# Patient Record
Sex: Female | Born: 1935 | Race: Black or African American | Hispanic: No | Marital: Married | Smoking: Never smoker
Health system: Southern US, Community
[De-identification: ages and names within clinical notes are randomized; demographics above are authoritative.]

## PROBLEM LIST (undated history)

## (undated) DIAGNOSIS — T8859XA Other complications of anesthesia, initial encounter: Secondary | ICD-10-CM

## (undated) DIAGNOSIS — D61818 Other pancytopenia: Secondary | ICD-10-CM

## (undated) DIAGNOSIS — Z992 Dependence on renal dialysis: Secondary | ICD-10-CM

## (undated) DIAGNOSIS — D649 Anemia, unspecified: Secondary | ICD-10-CM

## (undated) DIAGNOSIS — G4733 Obstructive sleep apnea (adult) (pediatric): Secondary | ICD-10-CM

## (undated) DIAGNOSIS — E119 Type 2 diabetes mellitus without complications: Secondary | ICD-10-CM

## (undated) DIAGNOSIS — R0989 Other specified symptoms and signs involving the circulatory and respiratory systems: Secondary | ICD-10-CM

## (undated) DIAGNOSIS — K219 Gastro-esophageal reflux disease without esophagitis: Secondary | ICD-10-CM

## (undated) DIAGNOSIS — Z972 Presence of dental prosthetic device (complete) (partial): Secondary | ICD-10-CM

## (undated) DIAGNOSIS — Z973 Presence of spectacles and contact lenses: Secondary | ICD-10-CM

## (undated) DIAGNOSIS — H8110 Benign paroxysmal vertigo, unspecified ear: Secondary | ICD-10-CM

## (undated) DIAGNOSIS — M109 Gout, unspecified: Secondary | ICD-10-CM

## (undated) DIAGNOSIS — N186 End stage renal disease: Secondary | ICD-10-CM

## (undated) DIAGNOSIS — M1990 Unspecified osteoarthritis, unspecified site: Secondary | ICD-10-CM

## (undated) DIAGNOSIS — H409 Unspecified glaucoma: Secondary | ICD-10-CM

## (undated) DIAGNOSIS — I495 Sick sinus syndrome: Secondary | ICD-10-CM

## (undated) DIAGNOSIS — I498 Other specified cardiac arrhythmias: Secondary | ICD-10-CM

## (undated) DIAGNOSIS — C9000 Multiple myeloma not having achieved remission: Secondary | ICD-10-CM

## (undated) DIAGNOSIS — I10 Essential (primary) hypertension: Secondary | ICD-10-CM

## (undated) HISTORY — DX: Other specified symptoms and signs involving the circulatory and respiratory systems: R09.89

## (undated) HISTORY — PX: BACK SURGERY: SHX140

## (undated) HISTORY — DX: Other specified cardiac arrhythmias: I49.8

## (undated) HISTORY — PX: ABDOMINAL HYSTERECTOMY: SHX81

## (undated) HISTORY — DX: Essential (primary) hypertension: I10

## (undated) HISTORY — DX: Type 2 diabetes mellitus without complications: E11.9

## (undated) HISTORY — PX: KNEE SURGERY: SHX244

## (undated) HISTORY — PX: PACEMAKER INSERTION: SHX728

## (undated) HISTORY — DX: Benign paroxysmal vertigo, unspecified ear: H81.10

---

## 1998-04-08 ENCOUNTER — Other Ambulatory Visit: Admission: RE | Admit: 1998-04-08 | Discharge: 1998-04-08 | Payer: Self-pay | Admitting: Internal Medicine

## 1998-04-08 ENCOUNTER — Ambulatory Visit (HOSPITAL_COMMUNITY): Admission: RE | Admit: 1998-04-08 | Discharge: 1998-04-08 | Payer: Self-pay | Admitting: Internal Medicine

## 1998-06-15 ENCOUNTER — Other Ambulatory Visit: Admission: RE | Admit: 1998-06-15 | Discharge: 1998-06-15 | Payer: Self-pay | Admitting: Internal Medicine

## 1998-09-07 ENCOUNTER — Ambulatory Visit (HOSPITAL_COMMUNITY): Admission: RE | Admit: 1998-09-07 | Discharge: 1998-09-07 | Payer: Self-pay | Admitting: Internal Medicine

## 1999-07-18 ENCOUNTER — Emergency Department (HOSPITAL_COMMUNITY): Admission: EM | Admit: 1999-07-18 | Discharge: 1999-07-18 | Payer: Self-pay | Admitting: Emergency Medicine

## 1999-11-03 ENCOUNTER — Ambulatory Visit (HOSPITAL_COMMUNITY): Admission: RE | Admit: 1999-11-03 | Discharge: 1999-11-03 | Payer: Self-pay | Admitting: Neurosurgery

## 1999-11-03 ENCOUNTER — Encounter: Payer: Self-pay | Admitting: Neurosurgery

## 1999-11-16 ENCOUNTER — Encounter: Payer: Self-pay | Admitting: Neurosurgery

## 1999-11-18 ENCOUNTER — Encounter: Payer: Self-pay | Admitting: Neurosurgery

## 1999-11-18 ENCOUNTER — Inpatient Hospital Stay (HOSPITAL_COMMUNITY): Admission: RE | Admit: 1999-11-18 | Discharge: 1999-11-21 | Payer: Self-pay | Admitting: Neurosurgery

## 1999-12-26 ENCOUNTER — Encounter: Admission: RE | Admit: 1999-12-26 | Discharge: 1999-12-26 | Payer: Self-pay | Admitting: Neurosurgery

## 1999-12-26 ENCOUNTER — Encounter: Payer: Self-pay | Admitting: Neurosurgery

## 2000-03-22 ENCOUNTER — Encounter: Payer: Self-pay | Admitting: Neurosurgery

## 2000-03-22 ENCOUNTER — Encounter: Admission: RE | Admit: 2000-03-22 | Discharge: 2000-03-22 | Payer: Self-pay | Admitting: Neurosurgery

## 2000-05-21 ENCOUNTER — Encounter: Admission: RE | Admit: 2000-05-21 | Discharge: 2000-05-21 | Payer: Self-pay | Admitting: Neurosurgery

## 2000-05-21 ENCOUNTER — Encounter: Payer: Self-pay | Admitting: Neurosurgery

## 2000-08-06 ENCOUNTER — Encounter: Payer: Self-pay | Admitting: Internal Medicine

## 2000-08-06 ENCOUNTER — Encounter: Admission: RE | Admit: 2000-08-06 | Discharge: 2000-08-06 | Payer: Self-pay | Admitting: Internal Medicine

## 2000-11-27 ENCOUNTER — Other Ambulatory Visit: Admission: RE | Admit: 2000-11-27 | Discharge: 2000-11-27 | Payer: Self-pay | Admitting: Internal Medicine

## 2000-11-29 ENCOUNTER — Encounter: Admission: RE | Admit: 2000-11-29 | Discharge: 2000-11-29 | Payer: Self-pay | Admitting: Neurosurgery

## 2000-11-29 ENCOUNTER — Encounter: Payer: Self-pay | Admitting: Neurosurgery

## 2001-08-08 ENCOUNTER — Emergency Department (HOSPITAL_COMMUNITY): Admission: EM | Admit: 2001-08-08 | Discharge: 2001-08-08 | Payer: Self-pay | Admitting: Emergency Medicine

## 2001-08-14 ENCOUNTER — Encounter: Payer: Self-pay | Admitting: Orthopaedic Surgery

## 2001-08-20 ENCOUNTER — Inpatient Hospital Stay (HOSPITAL_COMMUNITY): Admission: RE | Admit: 2001-08-20 | Discharge: 2001-08-24 | Payer: Self-pay | Admitting: Orthopaedic Surgery

## 2001-09-30 ENCOUNTER — Encounter: Admission: RE | Admit: 2001-09-30 | Discharge: 2001-11-11 | Payer: Self-pay | Admitting: Orthopaedic Surgery

## 2001-12-24 ENCOUNTER — Encounter: Admission: RE | Admit: 2001-12-24 | Discharge: 2001-12-24 | Payer: Self-pay | Admitting: Internal Medicine

## 2001-12-24 ENCOUNTER — Encounter: Payer: Self-pay | Admitting: Internal Medicine

## 2002-01-09 ENCOUNTER — Encounter: Payer: Self-pay | Admitting: Internal Medicine

## 2002-01-09 ENCOUNTER — Ambulatory Visit (HOSPITAL_COMMUNITY): Admission: RE | Admit: 2002-01-09 | Discharge: 2002-01-09 | Payer: Self-pay | Admitting: Internal Medicine

## 2002-02-16 ENCOUNTER — Encounter: Payer: Self-pay | Admitting: Neurosurgery

## 2002-02-16 ENCOUNTER — Ambulatory Visit (HOSPITAL_COMMUNITY): Admission: RE | Admit: 2002-02-16 | Discharge: 2002-02-16 | Payer: Self-pay | Admitting: Neurosurgery

## 2002-03-17 ENCOUNTER — Ambulatory Visit (HOSPITAL_COMMUNITY): Admission: RE | Admit: 2002-03-17 | Discharge: 2002-03-17 | Payer: Self-pay | Admitting: Gastroenterology

## 2002-03-17 ENCOUNTER — Encounter (INDEPENDENT_AMBULATORY_CARE_PROVIDER_SITE_OTHER): Payer: Self-pay | Admitting: *Deleted

## 2003-06-10 ENCOUNTER — Emergency Department (HOSPITAL_COMMUNITY): Admission: EM | Admit: 2003-06-10 | Discharge: 2003-06-10 | Payer: Self-pay | Admitting: Emergency Medicine

## 2003-10-20 ENCOUNTER — Encounter: Admission: RE | Admit: 2003-10-20 | Discharge: 2003-10-20 | Payer: Self-pay | Admitting: Internal Medicine

## 2004-01-06 ENCOUNTER — Other Ambulatory Visit: Admission: RE | Admit: 2004-01-06 | Discharge: 2004-01-06 | Payer: Self-pay | Admitting: Internal Medicine

## 2004-03-31 ENCOUNTER — Ambulatory Visit (HOSPITAL_COMMUNITY): Admission: RE | Admit: 2004-03-31 | Discharge: 2004-03-31 | Payer: Self-pay | Admitting: Neurosurgery

## 2004-05-18 ENCOUNTER — Encounter: Admission: RE | Admit: 2004-05-18 | Discharge: 2004-05-18 | Payer: Self-pay | Admitting: Neurosurgery

## 2004-05-31 ENCOUNTER — Encounter: Admission: RE | Admit: 2004-05-31 | Discharge: 2004-05-31 | Payer: Self-pay | Admitting: Neurosurgery

## 2004-07-14 ENCOUNTER — Ambulatory Visit (HOSPITAL_COMMUNITY): Admission: RE | Admit: 2004-07-14 | Discharge: 2004-07-14 | Payer: Self-pay | Admitting: Neurosurgery

## 2004-08-02 ENCOUNTER — Ambulatory Visit (HOSPITAL_BASED_OUTPATIENT_CLINIC_OR_DEPARTMENT_OTHER): Admission: RE | Admit: 2004-08-02 | Discharge: 2004-08-02 | Payer: Self-pay | Admitting: Internal Medicine

## 2004-11-18 ENCOUNTER — Ambulatory Visit (HOSPITAL_COMMUNITY): Admission: RE | Admit: 2004-11-18 | Discharge: 2004-11-18 | Payer: Self-pay | Admitting: Neurosurgery

## 2004-12-09 ENCOUNTER — Encounter: Admission: RE | Admit: 2004-12-09 | Discharge: 2004-12-09 | Payer: Self-pay | Admitting: Internal Medicine

## 2005-05-10 ENCOUNTER — Inpatient Hospital Stay (HOSPITAL_COMMUNITY): Admission: RE | Admit: 2005-05-10 | Discharge: 2005-05-11 | Payer: Self-pay | Admitting: Internal Medicine

## 2005-06-22 ENCOUNTER — Emergency Department (HOSPITAL_COMMUNITY): Admission: EM | Admit: 2005-06-22 | Discharge: 2005-06-22 | Payer: Self-pay | Admitting: *Deleted

## 2005-08-08 ENCOUNTER — Other Ambulatory Visit: Admission: RE | Admit: 2005-08-08 | Discharge: 2005-08-08 | Payer: Self-pay | Admitting: Internal Medicine

## 2005-12-04 ENCOUNTER — Ambulatory Visit (HOSPITAL_COMMUNITY): Admission: RE | Admit: 2005-12-04 | Discharge: 2005-12-04 | Payer: Self-pay | Admitting: Internal Medicine

## 2005-12-05 ENCOUNTER — Encounter (INDEPENDENT_AMBULATORY_CARE_PROVIDER_SITE_OTHER): Payer: Self-pay | Admitting: Cardiology

## 2005-12-05 ENCOUNTER — Ambulatory Visit (HOSPITAL_COMMUNITY): Admission: RE | Admit: 2005-12-05 | Discharge: 2005-12-05 | Payer: Self-pay | Admitting: Internal Medicine

## 2006-01-09 ENCOUNTER — Ambulatory Visit: Payer: Self-pay | Admitting: Cardiology

## 2006-01-23 ENCOUNTER — Ambulatory Visit: Payer: Self-pay

## 2006-03-02 ENCOUNTER — Ambulatory Visit: Payer: Self-pay | Admitting: Internal Medicine

## 2006-03-13 ENCOUNTER — Ambulatory Visit (HOSPITAL_COMMUNITY): Admission: RE | Admit: 2006-03-13 | Discharge: 2006-03-13 | Payer: Self-pay | Admitting: *Deleted

## 2006-03-13 ENCOUNTER — Encounter: Payer: Self-pay | Admitting: Vascular Surgery

## 2006-03-19 ENCOUNTER — Ambulatory Visit: Payer: Self-pay | Admitting: Cardiology

## 2006-03-26 ENCOUNTER — Ambulatory Visit: Payer: Self-pay | Admitting: Cardiology

## 2006-03-28 ENCOUNTER — Encounter: Admission: RE | Admit: 2006-03-28 | Discharge: 2006-03-28 | Payer: Self-pay | Admitting: Internal Medicine

## 2006-04-17 ENCOUNTER — Ambulatory Visit: Payer: Self-pay | Admitting: Internal Medicine

## 2006-04-23 ENCOUNTER — Ambulatory Visit: Payer: Self-pay | Admitting: Cardiology

## 2006-06-13 ENCOUNTER — Ambulatory Visit: Payer: Self-pay | Admitting: Internal Medicine

## 2006-06-15 ENCOUNTER — Ambulatory Visit: Payer: Self-pay | Admitting: Cardiology

## 2006-06-25 ENCOUNTER — Encounter: Admission: RE | Admit: 2006-06-25 | Discharge: 2006-06-25 | Payer: Self-pay | Admitting: Neurosurgery

## 2006-07-17 ENCOUNTER — Encounter: Admission: RE | Admit: 2006-07-17 | Discharge: 2006-07-17 | Payer: Self-pay | Admitting: Neurosurgery

## 2006-09-20 ENCOUNTER — Encounter: Admission: RE | Admit: 2006-09-20 | Discharge: 2006-09-20 | Payer: Self-pay | Admitting: Neurosurgery

## 2006-11-06 ENCOUNTER — Encounter: Admission: RE | Admit: 2006-11-06 | Discharge: 2006-12-04 | Payer: Self-pay | Admitting: Internal Medicine

## 2006-12-05 ENCOUNTER — Emergency Department (HOSPITAL_COMMUNITY): Admission: EM | Admit: 2006-12-05 | Discharge: 2006-12-05 | Payer: Self-pay | Admitting: Emergency Medicine

## 2006-12-05 ENCOUNTER — Encounter: Admission: RE | Admit: 2006-12-05 | Discharge: 2006-12-18 | Payer: Self-pay | Admitting: Internal Medicine

## 2006-12-17 ENCOUNTER — Ambulatory Visit: Payer: Self-pay | Admitting: Cardiology

## 2006-12-31 ENCOUNTER — Ambulatory Visit: Payer: Self-pay | Admitting: Cardiology

## 2006-12-31 LAB — CONVERTED CEMR LAB
ALT: 16 units/L (ref 0–40)
AST: 21 units/L (ref 0–37)
Albumin: 3.9 g/dL (ref 3.5–5.2)
Alkaline Phosphatase: 43 units/L (ref 39–117)
BUN: 26 mg/dL — ABNORMAL HIGH (ref 6–23)
Basophils Absolute: 0 10*3/uL (ref 0.0–0.1)
Basophils Relative: 0.6 % (ref 0.0–1.0)
Bilirubin, Direct: 0.1 mg/dL (ref 0.0–0.3)
CO2: 26 meq/L (ref 19–32)
Calcium: 9 mg/dL (ref 8.4–10.5)
Chloride: 106 meq/L (ref 96–112)
Creatinine, Ser: 1.2 mg/dL (ref 0.4–1.2)
Eosinophils Absolute: 0.1 10*3/uL (ref 0.0–0.6)
Eosinophils Relative: 1.9 % (ref 0.0–5.0)
GFR calc Af Amer: 56 mL/min
GFR calc non Af Amer: 47 mL/min
Glucose, Bld: 110 mg/dL — ABNORMAL HIGH (ref 70–99)
HCT: 32.1 % — ABNORMAL LOW (ref 36.0–46.0)
Hemoglobin: 11.1 g/dL — ABNORMAL LOW (ref 12.0–15.0)
Hgb A1c MFr Bld: 6.4 % — ABNORMAL HIGH (ref 4.6–6.0)
Lymphocytes Relative: 27.3 % (ref 12.0–46.0)
MCHC: 34.6 g/dL (ref 30.0–36.0)
MCV: 81.4 fL (ref 78.0–100.0)
Monocytes Absolute: 0.3 10*3/uL (ref 0.2–0.7)
Monocytes Relative: 5.6 % (ref 3.0–11.0)
Neutro Abs: 4 10*3/uL (ref 1.4–7.7)
Neutrophils Relative %: 64.6 % (ref 43.0–77.0)
Platelets: 228 10*3/uL (ref 150–400)
Potassium: 4.3 meq/L (ref 3.5–5.1)
RBC: 3.95 M/uL (ref 3.87–5.11)
RDW: 14.8 % — ABNORMAL HIGH (ref 11.5–14.6)
Sodium: 139 meq/L (ref 135–145)
T3, Free: 2.9 pg/mL (ref 2.3–4.2)
TSH: 2.71 microintl units/mL (ref 0.35–5.50)
Total Bilirubin: 0.7 mg/dL (ref 0.3–1.2)
Total Protein: 6.7 g/dL (ref 6.0–8.3)
WBC: 6.1 10*3/uL (ref 4.5–10.5)

## 2007-01-24 ENCOUNTER — Ambulatory Visit: Payer: Self-pay

## 2007-02-12 ENCOUNTER — Ambulatory Visit: Payer: Self-pay | Admitting: Internal Medicine

## 2007-03-27 ENCOUNTER — Ambulatory Visit: Payer: Self-pay | Admitting: Cardiology

## 2007-04-08 ENCOUNTER — Encounter: Admission: RE | Admit: 2007-04-08 | Discharge: 2007-04-08 | Payer: Self-pay | Admitting: Neurosurgery

## 2007-05-03 ENCOUNTER — Encounter: Admission: RE | Admit: 2007-05-03 | Discharge: 2007-05-03 | Payer: Self-pay | Admitting: Internal Medicine

## 2007-12-11 ENCOUNTER — Ambulatory Visit (HOSPITAL_COMMUNITY): Admission: RE | Admit: 2007-12-11 | Discharge: 2007-12-11 | Payer: Self-pay | Admitting: Internal Medicine

## 2007-12-20 ENCOUNTER — Ambulatory Visit: Payer: Self-pay

## 2007-12-20 ENCOUNTER — Ambulatory Visit: Payer: Self-pay | Admitting: Cardiology

## 2007-12-20 LAB — CONVERTED CEMR LAB
BUN: 30 mg/dL — ABNORMAL HIGH (ref 6–23)
CO2: 31 meq/L (ref 19–32)
Calcium: 9.4 mg/dL (ref 8.4–10.5)
Chloride: 104 meq/L (ref 96–112)
Creatinine, Ser: 1.2 mg/dL (ref 0.4–1.2)
GFR calc Af Amer: 56 mL/min
GFR calc non Af Amer: 47 mL/min
Glucose, Bld: 150 mg/dL — ABNORMAL HIGH (ref 70–99)
Potassium: 4.1 meq/L (ref 3.5–5.1)
Sodium: 141 meq/L (ref 135–145)

## 2008-01-24 ENCOUNTER — Encounter: Admission: RE | Admit: 2008-01-24 | Discharge: 2008-01-24 | Payer: Self-pay | Admitting: Orthopaedic Surgery

## 2008-02-12 ENCOUNTER — Encounter: Admission: RE | Admit: 2008-02-12 | Discharge: 2008-02-12 | Payer: Self-pay | Admitting: Orthopaedic Surgery

## 2008-02-12 ENCOUNTER — Encounter: Payer: Self-pay | Admitting: Internal Medicine

## 2008-02-24 ENCOUNTER — Ambulatory Visit: Payer: Self-pay | Admitting: Internal Medicine

## 2008-02-24 DIAGNOSIS — E119 Type 2 diabetes mellitus without complications: Secondary | ICD-10-CM

## 2008-02-24 DIAGNOSIS — G4733 Obstructive sleep apnea (adult) (pediatric): Secondary | ICD-10-CM | POA: Insufficient documentation

## 2008-02-24 DIAGNOSIS — R0989 Other specified symptoms and signs involving the circulatory and respiratory systems: Secondary | ICD-10-CM

## 2008-02-24 DIAGNOSIS — J309 Allergic rhinitis, unspecified: Secondary | ICD-10-CM | POA: Insufficient documentation

## 2008-02-24 DIAGNOSIS — I10 Essential (primary) hypertension: Secondary | ICD-10-CM | POA: Insufficient documentation

## 2008-02-24 DIAGNOSIS — G2589 Other specified extrapyramidal and movement disorders: Secondary | ICD-10-CM | POA: Insufficient documentation

## 2008-02-24 HISTORY — DX: Essential (primary) hypertension: I10

## 2008-02-24 HISTORY — DX: Other specified symptoms and signs involving the circulatory and respiratory systems: R09.89

## 2008-02-24 HISTORY — DX: Type 2 diabetes mellitus without complications: E11.9

## 2008-03-12 ENCOUNTER — Ambulatory Visit: Payer: Self-pay | Admitting: Internal Medicine

## 2008-03-12 ENCOUNTER — Encounter: Payer: Self-pay | Admitting: Internal Medicine

## 2008-03-18 ENCOUNTER — Encounter: Admission: RE | Admit: 2008-03-18 | Discharge: 2008-03-18 | Payer: Self-pay | Admitting: Neurosurgery

## 2008-05-22 ENCOUNTER — Encounter: Admission: RE | Admit: 2008-05-22 | Discharge: 2008-05-22 | Payer: Self-pay | Admitting: Internal Medicine

## 2008-06-30 ENCOUNTER — Ambulatory Visit: Payer: Self-pay | Admitting: Cardiology

## 2008-09-04 ENCOUNTER — Ambulatory Visit: Payer: Self-pay | Admitting: Cardiology

## 2008-10-12 ENCOUNTER — Ambulatory Visit: Payer: Self-pay | Admitting: Internal Medicine

## 2008-12-23 ENCOUNTER — Ambulatory Visit: Payer: Self-pay

## 2009-01-27 ENCOUNTER — Encounter: Payer: Self-pay | Admitting: Cardiology

## 2009-01-27 ENCOUNTER — Ambulatory Visit: Payer: Self-pay | Admitting: Cardiology

## 2009-01-27 DIAGNOSIS — I498 Other specified cardiac arrhythmias: Secondary | ICD-10-CM

## 2009-01-27 HISTORY — DX: Other specified cardiac arrhythmias: I49.8

## 2009-01-27 LAB — CONVERTED CEMR LAB
BUN: 45 mg/dL — ABNORMAL HIGH (ref 6–23)
Basophils Absolute: 0 10*3/uL (ref 0.0–0.1)
Basophils Relative: 0.6 % (ref 0.0–3.0)
CO2: 30 meq/L (ref 19–32)
Calcium: 8.9 mg/dL (ref 8.4–10.5)
Chloride: 105 meq/L (ref 96–112)
Creatinine, Ser: 1.3 mg/dL — ABNORMAL HIGH (ref 0.4–1.2)
Eosinophils Absolute: 0.1 10*3/uL (ref 0.0–0.7)
Eosinophils Relative: 0.8 % (ref 0.0–5.0)
GFR calc non Af Amer: 51.16 mL/min (ref >60)
Glucose, Bld: 94 mg/dL (ref 70–99)
HCT: 34.7 % — ABNORMAL LOW (ref 36.0–46.0)
Hemoglobin: 11.8 g/dL — ABNORMAL LOW (ref 12.0–15.0)
INR: 1 (ref 0.8–1.0)
Lymphocytes Relative: 23 % (ref 12.0–46.0)
Lymphs Abs: 1.9 10*3/uL (ref 0.7–4.0)
MCHC: 34 g/dL (ref 30.0–36.0)
MCV: 82.1 fL (ref 78.0–100.0)
Monocytes Absolute: 0.5 10*3/uL (ref 0.1–1.0)
Monocytes Relative: 6.3 % (ref 3.0–12.0)
Neutro Abs: 5.7 10*3/uL (ref 1.4–7.7)
Neutrophils Relative %: 69.3 % (ref 43.0–77.0)
Platelets: 186 10*3/uL (ref 150.0–400.0)
Potassium: 4.1 meq/L (ref 3.5–5.1)
Prothrombin Time: 10.7 s — ABNORMAL LOW (ref 10.9–13.3)
RBC: 4.22 M/uL (ref 3.87–5.11)
RDW: 14.5 % (ref 11.5–14.6)
Sodium: 141 meq/L (ref 135–145)
TSH: 1.36 microintl units/mL (ref 0.35–5.50)
WBC: 8.2 10*3/uL (ref 4.5–10.5)
aPTT: 24.4 s (ref 21.7–28.8)

## 2009-01-28 ENCOUNTER — Encounter: Payer: Self-pay | Admitting: Cardiology

## 2009-02-03 ENCOUNTER — Telehealth: Payer: Self-pay | Admitting: Internal Medicine

## 2009-02-04 ENCOUNTER — Ambulatory Visit: Payer: Self-pay | Admitting: Internal Medicine

## 2009-02-04 ENCOUNTER — Inpatient Hospital Stay (HOSPITAL_COMMUNITY): Admission: RE | Admit: 2009-02-04 | Discharge: 2009-02-05 | Payer: Self-pay | Admitting: Internal Medicine

## 2009-02-05 ENCOUNTER — Encounter: Payer: Self-pay | Admitting: Internal Medicine

## 2009-02-24 ENCOUNTER — Ambulatory Visit: Payer: Self-pay

## 2009-02-24 ENCOUNTER — Encounter: Payer: Self-pay | Admitting: Cardiology

## 2009-03-01 ENCOUNTER — Telehealth: Payer: Self-pay | Admitting: Cardiology

## 2009-03-02 ENCOUNTER — Ambulatory Visit: Payer: Self-pay | Admitting: Cardiology

## 2009-03-02 ENCOUNTER — Inpatient Hospital Stay (HOSPITAL_COMMUNITY): Admission: RE | Admit: 2009-03-02 | Discharge: 2009-03-05 | Payer: Self-pay | Admitting: Neurosurgery

## 2009-03-03 ENCOUNTER — Encounter: Payer: Self-pay | Admitting: Cardiology

## 2009-06-11 ENCOUNTER — Ambulatory Visit: Payer: Self-pay | Admitting: Internal Medicine

## 2009-11-11 ENCOUNTER — Ambulatory Visit: Payer: Self-pay

## 2009-11-11 ENCOUNTER — Telehealth: Payer: Self-pay | Admitting: Internal Medicine

## 2009-11-11 ENCOUNTER — Encounter: Payer: Self-pay | Admitting: Internal Medicine

## 2009-12-01 ENCOUNTER — Ambulatory Visit: Payer: Self-pay | Admitting: Internal Medicine

## 2009-12-30 ENCOUNTER — Inpatient Hospital Stay (HOSPITAL_COMMUNITY): Admission: RE | Admit: 2009-12-30 | Discharge: 2010-01-03 | Payer: Self-pay | Admitting: Orthopaedic Surgery

## 2009-12-31 ENCOUNTER — Ambulatory Visit: Payer: Self-pay | Admitting: Physical Medicine & Rehabilitation

## 2010-01-03 ENCOUNTER — Telehealth: Payer: Self-pay | Admitting: Internal Medicine

## 2010-03-20 ENCOUNTER — Observation Stay (HOSPITAL_COMMUNITY): Admission: EM | Admit: 2010-03-20 | Discharge: 2010-03-21 | Payer: Self-pay | Admitting: Emergency Medicine

## 2010-03-20 ENCOUNTER — Encounter: Payer: Self-pay | Admitting: Internal Medicine

## 2010-03-20 ENCOUNTER — Ambulatory Visit: Payer: Self-pay | Admitting: Cardiology

## 2010-03-21 ENCOUNTER — Encounter: Payer: Self-pay | Admitting: Cardiology

## 2010-03-23 ENCOUNTER — Encounter: Admission: RE | Admit: 2010-03-23 | Discharge: 2010-06-21 | Payer: Self-pay | Admitting: Cardiovascular Disease

## 2010-03-23 ENCOUNTER — Encounter: Payer: Self-pay | Admitting: Internal Medicine

## 2010-04-06 ENCOUNTER — Ambulatory Visit: Payer: Self-pay | Admitting: Internal Medicine

## 2010-04-06 DIAGNOSIS — H8110 Benign paroxysmal vertigo, unspecified ear: Secondary | ICD-10-CM | POA: Insufficient documentation

## 2010-04-06 HISTORY — DX: Benign paroxysmal vertigo, unspecified ear: H81.10

## 2010-06-02 ENCOUNTER — Telehealth: Payer: Self-pay | Admitting: Cardiology

## 2010-06-02 ENCOUNTER — Encounter: Payer: Self-pay | Admitting: Cardiology

## 2010-06-03 ENCOUNTER — Encounter: Payer: Self-pay | Admitting: Cardiology

## 2010-06-03 ENCOUNTER — Encounter: Admission: RE | Admit: 2010-06-03 | Discharge: 2010-06-22 | Payer: Self-pay | Admitting: Cardiology

## 2010-06-21 ENCOUNTER — Encounter: Payer: Self-pay | Admitting: Cardiology

## 2010-06-22 ENCOUNTER — Encounter: Payer: Self-pay | Admitting: Internal Medicine

## 2010-06-22 ENCOUNTER — Ambulatory Visit: Payer: Self-pay

## 2010-09-21 ENCOUNTER — Ambulatory Visit: Payer: Self-pay | Admitting: Internal Medicine

## 2010-09-26 ENCOUNTER — Encounter: Payer: Self-pay | Admitting: Cardiology

## 2010-10-05 ENCOUNTER — Telehealth: Payer: Self-pay | Admitting: Internal Medicine

## 2010-11-05 ENCOUNTER — Encounter: Payer: Self-pay | Admitting: Neurosurgery

## 2010-11-06 ENCOUNTER — Encounter: Payer: Self-pay | Admitting: Neurosurgery

## 2010-11-17 NOTE — Procedures (Signed)
Summary: eph/st jude/if incison looks good call dr Carloyn Manner [phone] to...    Current Medications (verified): 1)  Spironolactone 25 Mg  Tabs (Spironolactone) .... Take 1 By Mouth Every Other Day 2)  Diovan Hct 160-25 Mg  Tabs (Valsartan-Hydrochlorothiazide) .... Take 1 By Mouth Once Daily 3)  Multivitamins   Tabs (Multiple Vitamin) .... Take 1 By Mouth Once Daily 4)  Celebrex 200 Mg  Caps (Celecoxib) .... Take 1 By Mouth Once Daily 5)  Norvasc 10 Mg  Tabs (Amlodipine Besylate) .... Take 1 By Mouth Once Daily 6)  Tekturna 300 Mg  Tabs (Aliskiren Fumarate) .... Take 1 By Mouth Once Daily 7)  Glimepiride 1 Mg  Tabs (Glimepiride) .... One By Mouth Daily 8)  Zyrtec Allergy 10 Mg  Tabs (Cetirizine Hcl) .... Take 1 By Mouth Once Daily As Needed 9)  Skelaxin 800 Mg  Tabs (Metaxalone) .... As Needed 10)  Beano   Tabs (Alpha-D-Galactosidase) .... Take 1 By Mouth Beofre Dinner 11)  Flonase 50 Mcg/act  Susp (Fluticasone Propionate) .... As Needed 12)  Valium 5 Mg  Tabs (Diazepam) .... As Needed 13)  Cpap 8 [employer] 14)  Nexium 40 Mg Cpdr (Esomeprazole Magnesium) .... As Needed  Allergies (verified): 1)  ! Pcn 2)  ! Vicodin 3)  ! Cardizem 4)  ! Morphine  PPM Specifications Following MD:  Thompson Grayer, MD     PPM Vendor:  [employer]     PPM Model Number:  [id number]     PPM Serial Number:  [id number] DOI:  02/04/2009     PPM Implanting MD:  Thompson Grayer, MD  Lead 1    DOI: 02/04/2009     Model #: [id number]     Serial #: [id number]     Status: active Lead 2    Location: RV     DOI: 02/04/2009     Model #: [id number]     Serial #: [id number]     Status: active  Magnet Response Rate:  BOL 100 ERI 85  Indications:  Sinus node dysfunction   PPM Follow Up Remote Check?  No Battery Voltage:  3.10 V     Battery Est. Longevity:  6.3 years     Pacer Dependent:  Yes       PPM Device Measurements Atrium  Amplitude: >5.0 mV, Impedance: 400 ohms, Threshold: 0.5 V at 0.4 msec Right Ventricle  Amplitude: 11.8 mV,  Impedance: 460 ohms, Threshold: 0.75 V at 0.5 msec  Episodes MS Episodes:  0     Coumadin:  No Ventricular High Rate:  1     Atrial Pacing:  97%     Ventricular Pacing:  20%  Parameters Mode:  DDDR     Lower Rate Limit:  70     Upper Rate Limit:  125 Paced AV Delay:  200     Sensed AV Delay:  200 Next Cardiology Appt Due:  06/11/2009 Tech Comments:  Patient feels better with less aggressive rate response settings.   The settings were changed in the hospital by industry.  PMT episode was not a true episode.  The patient is very sensitive to testing.  Steri-strips removed.  No redness or edema.   Alma Friendly, LPN  May 12, [id number] AM

## 2010-11-17 NOTE — Assessment & Plan Note (Signed)
Summary: 2wk f/u@ 10:45  per amber   Visit Type:  surg clearance Referring Provider:  Jeanann Lewandowsky, MD Primary Provider:  Jeanann Lewandowsky, MD  CC:  pt is having total knee replacement.Marland Kitchenedema/ankles...denies any cp or sob.  History of Present Illness: The patient presents today for routine electrophysiology followup.She reports doing well since last being seen in our clinic.  She has lost 12 lbs with lifestyle modificaiton.  She reports occasional palpitations at night.  The patient denies symptoms of chest pain, shortness of breath, orthopnea, PND, lower extremity edema, dizziness, presyncope, syncope, or neurologic sequela. The patient is tolerating medications without difficulties and is otherwise without complaint today.   Current Medications (verified): 1)  Spironolactone 25 Mg  Tabs (Spironolactone) .... Take 1 By Mouth Every Other Day 2)  Diovan Hct 160-25 Mg  Tabs (Valsartan-Hydrochlorothiazide) .... Take 1 By Mouth Once Daily 3)  Multivitamins   Tabs (Multiple Vitamin) .... Take 1 By Mouth Once Daily 4)  Celebrex 200 Mg  Caps (Celecoxib) .... Take 1 By Mouth Once Daily 5)  Norvasc 10 Mg  Tabs (Amlodipine Besylate) .... Take 1 By Mouth Once Daily 6)  Tekturna 300 Mg  Tabs (Aliskiren Fumarate) .... Take 1 By Mouth Once Daily 7)  Glimepiride 2 Mg Tabs (Glimepiride) .Marland Kitchen.. 1 Tab Once Daily 8)  Zyrtec Allergy 10 Mg  Tabs (Cetirizine Hcl) .... Take 1 By Mouth Once Daily As Needed 9)  Skelaxin 800 Mg  Tabs (Metaxalone) .... As Needed 10)  Beano   Tabs (Alpha-D-Galactosidase) .... Take 1 By Mouth Beofre Dinner 11)  Flonase 50 Mcg/act  Susp (Fluticasone Propionate) .... As Needed 12)  Cpap 8 [employer] 13)  Xalatan 0.005 % Soln (Latanoprost) .... At Bedtime 14)  Aspirin 81 Mg Tbec (Aspirin) .... Take One Tablet By Mouth Daily 15)  Dexilant 60 Mg Cpdr (Dexlansoprazole) .Marland Kitchen.. 1 Cap Once Daily  Allergies: 1)  ! Pcn 2)  ! Vicodin 3)  ! Cardizem 4)  ! Morphine 5)  ! Diona Fanti  Past History:  Past  Medical History: Reviewed history from 06/11/2009 and no changes required. 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 Cauterized nose Back surgery Hysterectomy Appendectomy Breast bx Pacemaker implantation  Social History: Reviewed history from 09/21/2008 and no changes required. She is a retired [employer] scientist.  Lives in [place] with her  husband.  Does have children.  No smoking or alcohol.  No recreational  drugs.Patient never smoked.   Review of Systems       All systems are reviewed and negative except as listed in the HPI.   Vital Signs:  Patient profile:   77 year old female Height:      64 inches Weight:      188 pounds BMI:     32.39 Pulse rate:   70 / minute Pulse rhythm:   regular BP sitting:   136 / 70  (left arm) Cuff size:   regular  Vitals Entered By: Julaine Hua, CMA (December 01, 2009 10:43 AM)  Physical Exam  General:  obese, NAD Head:  normocephalic and atraumatic Eyes:  PERRLA/EOM intact; conjunctiva and lids normal. Nose:  no deformity, discharge, inflammation, or lesions Mouth:  Teeth, gums and palate normal. Oral mucosa normal. Neck:  Neck supple, no JVD. No masses, thyromegaly or  abnormal cervical nodes. Chest Wall:  pacemaker site is well healed. Lungs:  Clear bilaterally to auscultation and percussion. Heart:  Non-displaced PMI, chest non-tender; regular rate and rhythm, S1, S2 without murmurs, rubs or gallops. Carotid upstroke normal, no bruit. Normal abdominal aortic size, no bruits. Femorals normal pulses, no bruits. Pedals normal pulses. No edema, no varicosities. Abdomen:  Bowel sounds positive; abdomen soft and non-tender without  masses, organomegaly, or hernias noted. No hepatosplenomegaly. Msk:  Back normal, normal gait. Muscle strength and tone normal. Pulses:  pulses normal in all 4 extremities Extremities:  No clubbing or cyanosis. Neurologic:  Alert and oriented x 3. Skin:  Intact without lesions or rashes. Cervical Nodes:  no significant adenopathy Psych:  Normal affect.   PPM Specifications Following MD:  Thompson Grayer, MD     PPM Vendor:  [employer] Jude     PPM Model Number:  [id number]     PPM Serial Number:  [id number] PPM DOI:  02/04/2009     PPM Implanting MD:  Thompson Grayer, MD  Lead 1    Location: RA     DOI: 02/04/2009     Model #: [id number]     Serial #: [id number]     Status: active Lead 2    Location: RV     DOI: 02/04/2009     Model #: [id number]     Serial #: [id number]     Status: active  Magnet Response Rate:  BOL 100 ERI 85  Indications:  Sinus node dysfunction   PPM Follow Up Remote Check?  No Battery Voltage:  2.96 V     Battery Est. Longevity:  7.9 YEARS     Pacer Dependent:  No       PPM Device Measurements Atrium  Impedance: 460 ohms,  Right Ventricle  Amplitude: 12 mV, Impedance: 580 ohms, Threshold: 1.125 V at 0.4 msec  Episodes MS Episodes:  2     Percent Mode Switch:  <1%     Coumadin:  No Ventricular High Rate:  0     Atrial Pacing:  98%     Ventricular Pacing:  33%  Parameters Mode:  DDDR     Lower Rate Limit:  70     Upper Rate Limit:  125 Paced AV Delay:  200     Sensed AV Delay:  200 Tech Comments:  Interrogation only today for palpitations.  Pt was given a magnet at last office visit to evaluate symptoms.  1 mode switch episode due to PAC's, other due to junctional rhythm.  Both less than 1 minute.  Magnet triggered episodes were sensor driven pacing in the middle of the night.  Recovery time changed to fast today from medium (pt reports symtpoms when lying back down in bed), Rest rate turned on at 60bpm, magnet triggered episode turned off.  No other changes made.  Follow up as scheduled.  Chanetta Marshall RN BSN  December 01, 2009 11:05 AM  MD Comments:  agree with above  Impression & Recommendations:  Problem # 1:  PREOPERATIVE EXAMINATION (ICD-V72.84) Pts activity is greater than 5 mets, without symtoms of ischemia.  She is planned for moderate risk surgery. At this point, I would recommend that she proceed to surgery if necessary without further cardiac testing.  Problem # 2:  BRADYCARDIA (ICD-427.89) Normal pacemaker function. She has had increased heart rates due to the sensor function of the device.  I have reprogrammed the device as above to minimize symtoms of "increased heart rate"  and palpitations.  Her updated medication list for this problem includes:    Norvasc 10 Mg Tabs (Amlodipine besylate) .Marland Kitchen... Take 1 by mouth once daily    Aspirin 81 Mg Tbec (Aspirin) .Marland Kitchen... Take one tablet by mouth daily  Problem # 3:  HYPERTENSION (ICD-401.9) stable no changes  Her updated medication list for this problem includes:    Spironolactone 25 Mg Tabs (Spironolactone) .Marland Kitchen... Take 1 by mouth every other day    Diovan Hct 160-25 Mg Tabs (Valsartan-hydrochlorothiazide) .Marland Kitchen... Take 1 by mouth once daily    Norvasc 10 Mg Tabs (Amlodipine besylate) .Marland Kitchen... Take 1 by mouth once daily    Tekturna 300 Mg Tabs (Aliskiren fumarate) .Marland Kitchen... Take 1 by mouth once daily    Aspirin 81 Mg Tbec (Aspirin) .Marland Kitchen... Take one tablet by mouth daily  Patient Instructions: 1)  Follow-up with Dr Percival Spanish as scheduled. 2)  Continue regularl pacemaker checks.

## 2010-11-17 NOTE — Miscellaneous (Signed)
Summary: cancelled appt 02-11-08, has [facility]  Clinical Lists Changes  pt cancelled appointment with Dr. Annamaria Boots 02-11-08, has rescheduled for 02-24-08. Parke Poisson CNA  February 12, 2008 2:55 PM

## 2010-11-17 NOTE — Letter (Signed)
Summary: Intial Summary for Physical Therapy Services   Intial Summary for Physical Therapy Services   Imported By: Marilynne Drivers 05/25/2010 14:20:06  _____________________________________________________________________  External Attachment:    Type:   Image     Comment:   External Document

## 2010-11-17 NOTE — Miscellaneous (Signed)
Summary: Initial Summary for Physical Therapy Services   Initial Summary for Physical Therapy Services   Imported By: Sallee Provencal 06/27/2010 12:05:12  _____________________________________________________________________  External Attachment:    Type:   Image     Comment:   External Document

## 2010-11-17 NOTE — Progress Notes (Signed)
Summary: need order today for Neuro referral  /  done  Phone Note Call from Patient Call back at Home Phone [phone] Call back at [phone]   Caller: Patient Summary of Call: Pt  having dizziness and need a letter stating she need to be seen by neuro Initial call taken by: Delsa Sale,  June 02, 2010 9:20 AM  Follow-up for Phone Call        fax [phone]  [facility]. Dr Burt Knack wrote for pt to have this post hospital.  She did one treatment after discharge. Also was giving Meclizine.  Wants an order to go back.  She is having Vertigo again and needs order to go back to Neuro rehab at [facility] They can see her tomorrow at 9:45 tomorrow if she obtains an order.  Janan Halter, RN, BSN  June 02, 2010 11:42 AM  Additional Follow-up for Phone Call Additional follow up Details #1::        OK to refer patient to neurology for evalutation of dizziness not thought to be cardiac Additional Follow-up by: Minus Breeding, MD, [facility],  June 02, 2010 1:16 PM    Additional Follow-up for Phone Call Additional follow up Details #2::    pt's husband aware and order faxed. Follow-up by: Sim Boast, RN,  June 02, 2010 2:16 PM

## 2010-11-17 NOTE — Miscellaneous (Signed)
  Clinical Lists Changes  Observations: Added new observation of HOLTERFIND: Sinus rythm, sinus bradycardia, PAC's, SVT 4 beats, Symptomatic junctional bradycardia. (09/04/2008 15:16) Added new observation of US CAROTID: Stable, mild carotid disease, bilaterally 0-39%, bilateral ICA stenosis. f/u 2 years (12/20/2007 15:17) Added new observation of NUCLEAR NOS: Exercise capacity: Adenosine study with no exercise  Blood Pressure response: Normal blood pressure response  Clinical sypmptoms: SOB  ECG impressions: No diagnostic ST changes to suggest ischemia by standard criteria.  Overall impressions:  Mildy aabnormal. There is very mild reversibly in the idstal anterior wall. it is possible that this could represent mild ischemia. (01/23/2006 15:15)      Nuclear Study  Procedure date:  01/23/2006  Findings:      Exercise capacity: Adenosine study with no exercise  Blood Pressure response: Normal blood pressure response  Clinical sypmptoms: SOB  ECG impressions: No diagnostic ST changes to suggest ischemia by standard criteria.  Overall impressions:  Mildy aabnormal. There is very mild reversibly in the idstal anterior wall. it is possible that this could represent mild ischemia.  Holter Monitor  Procedure date:  09/04/2008  Findings:      Sinus rythm, sinus bradycardia, PAC's, SVT 4 beats, Symptomatic junctional bradycardia.  Carotid Doppler  Procedure date:  12/20/2007  Findings:      Stable, mild carotid disease, bilaterally 0-39%, bilateral ICA stenosis. f/u 2 years

## 2010-11-17 NOTE — Progress Notes (Signed)
Summary: pt has medication question  Phone Note Call from Patient Call back at Home Phone [phone]   Caller: Patient Reason for Call: Talk to Nurse, Talk to Doctor Summary of Call: pt was given nitro 0.2 percent from dr Carma Leaven for her  to use rectally and she wants to make sure it's ok because she has a pacer. Initial call taken by: Shelda Pal,  October 05, 2010 12:34 PM  Follow-up for Phone Call        she needs to use qid trying to relax sphincter muscle  Is this ok?  Please advise Janan Halter, RN, BSN  October 05, 2010 1:58 PM  Follow-up by: Thompson Grayer, MD,  October 05, 2010 10:53 PM  Additional Follow-up for Phone Call Additional follow up Details #1::        no contraindication from pacemaker standpoint pt aware Janan Halter, RN, BSN  October 07, 2010 4:13 PM

## 2010-11-17 NOTE — Miscellaneous (Signed)
Summary: removed demadex from med list  Clinical Lists Changes  Medications: Removed medication of DEMADEX 10 MG TABS (TORSEMIDE) one by mouth 3 time a week

## 2010-11-17 NOTE — Progress Notes (Signed)
Summary: didn't get lab work for device implant tommorrow  Phone Note Call from Patient Call back at Home Phone [phone]   Complaint: Breathing Problems Summary of Call: pt is suppose to get a pacer implant tomorrow she was suppose to get labe work on 4/14 but she didn't see it on the sheet what dose she need to do Initial call taken by: Shelda Pal,  February 03, 2009 10:52 AM  Follow-up for Phone Call        Pt had appt with Hochrein on 4/14 & had pre-op labs drawn that day.  Results have been posted & Hochrein has reviewed & signed off on them.   Told pt that the labs were already done; all she has to do is arrive at the appointed time & place  tomorrow at [facility] for her procedure.  She verbalized understanding.   Follow-up by: Merdis Delay, RN, BSN,  February 03, 2009 11:03 AM

## 2010-11-17 NOTE — Miscellaneous (Signed)
Summary: Discharge Summary  Discharge Summary   Imported By: Sallee Provencal 07/14/2010 14:55:39  _____________________________________________________________________  External Attachment:    Type:   Image     Comment:   External Document

## 2010-11-17 NOTE — Progress Notes (Signed)
Summary: inpatient/knee surgery/heart racing  Phone Note Call from Patient Call back at Home Phone [phone] Call back at [phone]   Caller: Spouse Summary of Call: pt in the hospital for knee replacement, heart racing when she has electros on her knee for pain, thinks that it is doing something to her pacer, has told the nurse at the hospital but they have not seen a cardiologist, req call back Initial call taken by: Darnell Level,  January 03, 2010 11:14 AM  Follow-up for Phone Call        per spouse pt was in hospital yesterday she had a total knee replacement  on the 17th and upon being discharged she went to a nursing home rehab ctr yesterday about 1pm. He called because her heart was racing and he still has not gotten a call back. pt had a bad night last night heart was racing and pt could not sleep. [place]  January 04, 2010 9:54 AM   Additional Follow-up for Phone Call Additional follow up Details #1::        will have rep with [facility] go by and check her device and report back to [place] Janan Halter, RN, BSN  January 05, 2010 12:28 PM

## 2010-11-17 NOTE — Miscellaneous (Signed)
Summary: [name]   [facility] Progress [facility]   Imported By: Sallee Provencal 10/13/2010 10:05:17  _____________________________________________________________________  External Attachment:    Type:   Image     Comment:   External Document

## 2010-11-17 NOTE — Procedures (Signed)
Summary: device/saf   Current Medications (verified): 1)  Spironolactone 25 Mg  Tabs (Spironolactone) .... Take 1 By Mouth Every Other Day 2)  Diovan Hct 160-25 Mg  Tabs (Valsartan-Hydrochlorothiazide) .... Take 1 By Mouth Once Daily 3)  Multivitamins   Tabs (Multiple Vitamin) .... Take 1 By Mouth Once Daily 4)  Celebrex 200 Mg  Caps (Celecoxib) .... Take 1 By Mouth Once Daily 5)  Norvasc 10 Mg  Tabs (Amlodipine Besylate) .... Take 1 By Mouth Once Daily 6)  Tekturna 300 Mg  Tabs (Aliskiren Fumarate) .... Take 1 By Mouth Once Daily 7)  Glimepiride 2 Mg Tabs (Glimepiride) .Marland Kitchen.. 1 Tab Once Daily 8)  Zyrtec Allergy 10 Mg  Tabs (Cetirizine Hcl) .... Take 1 By Mouth Once Daily As Needed 9)  Skelaxin 800 Mg  Tabs (Metaxalone) .... As Needed 10)  Flonase 50 Mcg/act  Susp (Fluticasone Propionate) .... As Needed 11)  Xalatan 0.005 % Soln (Latanoprost) .... At Bedtime 12)  Aspirin 81 Mg Tbec (Aspirin) .... Take One Tablet By Mouth Daily 13)  Dexilant 60 Mg Cpdr (Dexlansoprazole) .Marland Kitchen.. 1 Cap Once Daily  Allergies (verified): 1)  ! Pcn 2)  ! Vicodin 3)  ! Cardizem 4)  ! Morphine 5)  ! Diona Fanti  PPM Specifications Following MD:  Thompson Grayer, MD     PPM Vendor:  [employer]     PPM Model Number:  [id number]     PPM Serial Number:  [id number] DOI:  02/04/2009     PPM Implanting MD:  Thompson Grayer, MD  Lead 1    Location: RA     DOI: 02/04/2009     Model #: [id number]     Serial #: [id number]     Status: active Lead 2    Location: RV     DOI: 02/04/2009     Model #: [id number]     Serial #: [id number]     Status: active  Magnet Response Rate:  BOL 100 ERI 85  Indications:  Sinus node dysfunction   PPM Follow Up Battery Voltage:  2.93 V     Battery Est. Longevity:  5.5-6.3 yrs     Pacer Dependent:  No       PPM Device Measurements Atrium  Amplitude: 3.7 mV, Impedance: 410 ohms, Threshold: 1.0 V at 0.4 msec Right Ventricle  Amplitude: 12.0 mV, Impedance: 440 ohms, Threshold: 1.0 V at 0.4 msec  Episodes MS  Episodes:  6     Coumadin:  No Ventricular High Rate:  0     Atrial Pacing:  >99%     Ventricular Pacing:  44%  Parameters Mode:  DDIR     Lower Rate Limit:  70     Upper Rate Limit:  105 Paced AV Delay:  275     Sensed AV Delay:  200 Next Cardiology Appt Due:  09/15/2010 Tech Comments:  NORMAL DEVICE FUNCTION.  NO CHANGES MADE. ROV IN 3 MTHS W/JA. Shelly Bombard  June 22, 2010 12:32 PM

## 2010-11-17 NOTE — Assessment & Plan Note (Signed)
Summary: fu 6 months////kp   PCP:  Jeanann Lewandowsky  Chief Complaint:  6 month follow up visit .  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  She continues CPAP at [facility]  She has not been needing Lunesta as a sleeping pill.  She skipped some nights with CPAP during the winter because she says she was anxious.  Her sleep is also disturbed by arthritis, leg cramps occasionally, and nocturia sometimes 4 or 5 times a night.  We also reviewed.  additional past medical history, which is charted.  Her mother had tuberculosis and exposed the patient in the 25s.  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  I suggested that we recheck that now.  Her last chest x-ray was in 2008. spirometry and 2007 showed reversible obstructive change in small airways.  10/12/08-OSA, allergic rhintis  Was off cpap in October with a viral repiratory infection. Is convinced she sleeps better with cpap.   Treatment options, sleep hygiene, sleep environment, driving safety were discussed.         Prior Medication List:  SPIRONOLACTONE 25 MG  TABS (SPIRONOLACTONE) take 1 by mouth every other day DIOVAN HCT 160-25 MG  TABS (VALSARTAN-HYDROCHLOROTHIAZIDE) take 1 by mouth once daily MULTIVITAMINS   TABS (MULTIPLE VITAMIN) take 1 by mouth once daily CELEBREX 200 MG  CAPS (CELECOXIB) take 1 by mouth once daily NORVASC 10 MG  TABS (AMLODIPINE BESYLATE) take 1 by mouth once daily TEKTURNA 300 MG  TABS (ALISKIREN FUMARATE) take 1 by mouth once daily GLIMEPIRIDE 1 MG  TABS (GLIMEPIRIDE) take 1/2  by mouth once daily ZYRTEC ALLERGY 10 MG  TABS (CETIRIZINE HCL) take 1 by mouth once daily as  needed SKELAXIN 800 MG  TABS (METAXALONE) as needed BEANO   TABS (ALPHA-D-GALACTOSIDASE) take 1 by mouth beofre dinner FLONASE 50 MCG/ACT  SUSP (FLUTICASONE PROPIONATE) as needed VALIUM 5 MG  TABS (DIAZEPAM) as needed   Updated Prior Medication List: SPIRONOLACTONE 25 MG  TABS (SPIRONOLACTONE) take 1 by mouth every other day DIOVAN HCT 160-25 MG  TABS (VALSARTAN-HYDROCHLOROTHIAZIDE) take 1 by mouth once daily MULTIVITAMINS   TABS (MULTIPLE VITAMIN) take 1 by mouth once daily CELEBREX 200 MG  CAPS (CELECOXIB) take 1 by mouth once daily NORVASC 10 MG  TABS (AMLODIPINE BESYLATE) take 1 by mouth once daily TEKTURNA 300 MG  TABS (ALISKIREN FUMARATE) take 1 by mouth once daily GLIMEPIRIDE 1 MG  TABS (GLIMEPIRIDE) take 1/2  by mouth once daily FLONASE 50 MCG/ACT  SUSP (FLUTICASONE PROPIONATE) as needed ZYRTEC ALLERGY 10 MG  TABS (CETIRIZINE HCL) take 1 by mouth once daily as needed SKELAXIN 800 MG  TABS (METAXALONE) as needed VALIUM 5 MG  TABS (DIAZEPAM) as needed BEANO   TABS (ALPHA-D-GALACTOSIDASE) take 1 by mouth beofre dinner  Current Allergies (reviewed today): ! PCN ! VICODIN ! CARDIZEM ! 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  Lives in [place] with her        husband.  Does have children.  No smoking or alcohol.  No recreational        drugs.Patient never smoked.     Review of Systems      See HPI       Occasional hard heardbeat, mild epistaxis right nostril.   Vital Signs:  Patient Profile:   77 Years Old  Female Weight:      205.50 pounds O2 Sat:      99 % O2 treatment:    Room Air Pulse rate:   65 / minute BP sitting:   142 / 78  (left arm) Cuff size:   large  Vitals Entered By: Clayborne Dana CMA (October 12, 2008 3:07 PM)             Comments Medications reviewed with patient Clayborne Dana CMA  October 12, 2008 3:08 PM      Physical Exam  General: A/Ox3; pleasant and cooperative, NAD, SKIN: no rash, lesions NODES: no lymphadenopathy HEENT: [place]/AT, EOM- WNL, Conjuctivae- clear, PERRLA, TM-WNL, Nose- cminor erosinon on right, Throat- clear and wnl NECK: Supple w/ fair ROM, JVD- none, normal carotid impulses w/o bruits Thyroid- normal to palpation CHEST: Clear to P&A HEART: RRR, no m/g/r heard ABDOMEN: Soft and nl; nml bowel sounds; no organomegaly or masses noted [id number], nl pulses, no edema  NEURO: Grossly intact to observation         Impression & Recommendations:  Problem # 1:  OBSTRUCTIVE SLEEP APNEA (ICD-327.23) CPAP 8 still appropriate. discussed care and longevity of cpap.  Problem # 2:  ALLERGIC RHINITIS (ICD-477.9) Discussed nasal saline gel vs vaseline Her updated medication list for this problem includes:    Zyrtec Allergy 10 Mg Tabs (Cetirizine hcl) .Marland Kitchen... Take 1 by mouth once daily as needed    Flonase 50 Mcg/act Susp (Fluticasone propionate) .Marland Kitchen... As needed   Medications Added to Medication List This Visit: 1)  Cpap 8 [facility]    Patient Instructions: 1)  Please schedule a follow-up appointment in 1 year. 2)  Call if needed.   ]

## 2010-11-17 NOTE — Assessment & Plan Note (Signed)
Summary: [facility]   Visit Type:  Pre-op Evaluation Referring Provider:  Glenna Fellows, MD Primary Provider:  Jeanann Lewandowsky, MD  CC:  SOB with exertion and palpitations.  History of Present Illness: The patient presents for preoperative evaluation prior to a possible laminectomy. I saw her last year to clear her for a shoulder surgery. However, she did not have that surgery. She was afraid to have it because of palpitations that she has been experiencing. I have evaluated these with a Holter monitor. This showed symptomatic junctional bradycardia. She says she is having more of these symptoms. It happens frequently at night. She is also noticing it during the day. She says her heart rate is in the 40s and she feels it beating hard. She feels a little weak. She has not had any presyncope or syncope. She denies any chest discomfort, neck or arm discomfort. She is not having any new shortness of breath, PND or orthopnea although she does get dyspneic with moderate exertion.  Of note the patient does have sleep apnea and she does try to wear her mask but can wear it only infrequently.  Current Medications (verified): 1)  Spironolactone 25 Mg  Tabs (Spironolactone) .... Take 1 By Mouth Every Other Day 2)  Diovan Hct 160-25 Mg  Tabs (Valsartan-Hydrochlorothiazide) .... Take 1 By Mouth Once Daily 3)  Multivitamins   Tabs (Multiple Vitamin) .... Take 1 By Mouth Once Daily 4)  Celebrex 200 Mg  Caps (Celecoxib) .... Take 1 By Mouth Once Daily 5)  Norvasc 10 Mg  Tabs (Amlodipine Besylate) .... Take 1 By Mouth Once Daily 6)  Tekturna 300 Mg  Tabs (Aliskiren Fumarate) .... Take 1 By Mouth Once Daily 7)  Glimepiride 1 Mg  Tabs (Glimepiride) .... Take 1 or 2  By Mouth Once Daily 8)  Zyrtec Allergy 10 Mg  Tabs (Cetirizine Hcl) .... Take 1 By Mouth Once Daily As Needed 9)  Skelaxin 800 Mg  Tabs (Metaxalone) .... As Needed 10)  Beano   Tabs (Alpha-D-Galactosidase) .... Take 1 By Mouth Beofre Dinner 11)   Flonase 50 Mcg/act  Susp (Fluticasone Propionate) .... As Needed 12)  Valium 5 Mg  Tabs (Diazepam) .... As Needed 13)  Cpap 8 [facility] 14)  Demadex 10 Mg Tabs (Torsemide) .... One By Mouth 3 Time A Week 15)  Celebrex 200 Mg Caps (Celecoxib) .... Daily 16)  Nexium 40 Mg Cpdr (Esomeprazole Magnesium) .... As Needed  Allergies (verified): 1)  ! Pcn 2)  ! Vicodin 3)  ! Cardizem 4)  ! 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       Her father died at the age of 26 with bone cancer.  She has one sister, age       43, who has hypertension and colon cancer now in remission.  Her five children  are alive and well but the 46 year old has hypertension and the 75 year old       has diabetes and hypertension and the 75 year old has diabetes and       hypertension.  Social History:    Reviewed history from 09/21/2008 and no changes required:       She is a retired Marine scientist.  Lives in [place] with her        husband.  Does have children.  No smoking or alcohol.  No recreational        drugs.Patient never smoked.   Review of Systems       As stated in the HPI and negative for all other systems.   Vital Signs:  Patient profile:   77 year old female Height:      64 inches Weight:      199 pounds BMI:     34.28 Pulse rate:   49 / minute Resp:     18 per minute BP sitting:   165 / 70  (right arm)  Vitals Entered By: Levora Angel, CNA (January 27, 2009 2:06  PM)  Physical Exam  General:  Well developed, well nourished, in no acute distress. Head:  normocephalic and atraumatic Eyes:  PERRLA/EOM intact; conjunctiva and lids normal. Mouth:  Teeth, gums and palate normal. Oral mucosa normal. Abdomen:  Bowel sounds positive; abdomen soft and non-tender without masses, organomegaly, or hernias noted. No hepatosplenomegaly. Msk:  Back normal, normal gait. Muscle strength and tone normal. Extremities:  No clubbing or cyanosis. Neurologic:  Alert and oriented x 3. Skin:  Intact without lesions or rashes. Cervical Nodes:  no significant adenopathy Axillary Nodes:  no significant adenopathy Inguinal Nodes:  no significant adenopathy Psych:  Normal affect.   Detailed Cardiovascular Exam  Neck    Carotids: soft right carotid bruit    Neck Veins: Normal, no JVD.    Heart    Inspection: no deformities or lifts noted.      Palpation: normal PMI with no thrills palpable.      Auscultation: soft 2/6 systolic murmur radiating slightly at the aortic outflow tract, no diastolic murmurs  Vascular    Abdominal Aorta: no palpable masses, pulsations, or audible bruits.      Femoral Pulses: normal femoral pulses bilaterally.      Pedal Pulses: normal pedal pulses bilaterally.      Radial Pulses: normal radial pulses bilaterally.      Peripheral Circulation: no clubbing, cyanosis, or edema noted with normal capillary refill.     Impression & Recommendations:  Problem # 1:  PREOPERATIVE EXAMINATION (ICD-V72.84) Given the patient's symptomatic bradycardia this will be addressed the war she is cleared for surgery. The plan for this is described below. Once she has had her arrhythmia at rest she would be cleared from a cardiovascular standpoint as she has no angina and has had a stress perfusion study in the last few years.  Problem # 2:  BRADYCARDIA (ICD-427.89) The patient has a bradycardia that has become more symptomatic. This could be related to sleep  apnea. However, she wears the CPAP as much as she can and so further treatment is not an option to reverse her bradycardia arrhythmia. There is also a possibility that her bradycardia arrhythmia worsens or sleep apnea. Regardless she has an indication for pacing. Her husband has been through this so she knows the procedure. She understands the risks and agrees to proceed. I have discussed this with Dr. Rayann Heman who will arrange the procedure.  I will be checking a TSH as well.  Problem # 3:  CAROTID BRUIT (ICD-785.9) She had mild carotid plaque. This will be followed again in 2 years. She will continue with risk reduction.  Problem # 4:  HYPERTENSION (ICD-401.9) Her blood pressure is not quite at target. I have agreed to not start another medication as long as she loses 10 pounds. I suspect this will bring her to target.  Problem # 5:  OBSTRUCTIVE SLEEP APNEA (ICD-327.23) As above she does try to wear the CPAP. She will continue with this and with plans for weight loss.

## 2010-11-17 NOTE — Cardiovascular Report (Signed)
Summary: Office Visit   Office Visit   Imported By: Sallee Provencal 11/23/2009 15:39:18  _____________________________________________________________________  External Attachment:    Type:   Image     Comment:   External Document

## 2010-11-17 NOTE — Assessment & Plan Note (Signed)
Summary: [facility]   Visit Type:  Pacemaker check Referring Provider:  Jeanann Lewandowsky, MD Primary Provider:  Jeanann Lewandowsky, MD  CC:   .  History of Present Illness: The patient presents today for routine electrophysiology followup. She reports doing very well since last being seen in our clinic. She continues to have occasional vertigo.  The patient denies symptoms of palpitations, chest pain, shortness of breath, orthopnea, PND, lower extremity edema, dizziness, presyncope, syncope, or neurologic sequela. The patient is tolerating medications without difficulties and is otherwise without complaint today.   Current Medications (verified): 1)  Spironolactone 25 Mg  Tabs (Spironolactone) .... Take 1 By Mouth Every Other Day 2)  Diovan Hct 160-25 Mg  Tabs (Valsartan-Hydrochlorothiazide) .... Take 1 By Mouth Once Daily 3)  Multivitamins   Tabs (Multiple Vitamin) .... Take 1 By Mouth Once Daily 4)  Celebrex 200 Mg  Caps (Celecoxib) .... Take 1 By Mouth Once Daily 5)  Norvasc 10 Mg  Tabs (Amlodipine Besylate) .... Take 1 By Mouth Once Daily 6)  Tekturna 300 Mg  Tabs (Aliskiren Fumarate) .... Take 1 By Mouth Once Daily 7)  Glimepiride 2 Mg Tabs (Glimepiride) .Marland Kitchen.. 1 Tab Once Daily 8)  Zyrtec Allergy 10 Mg  Tabs (Cetirizine Hcl) .... Take 1 By Mouth Once Daily As Needed 9)  Skelaxin 800 Mg  Tabs (Metaxalone) .... As Needed 10)  Flonase 50 Mcg/act  Susp (Fluticasone Propionate) .... As Needed 11)  Xalatan 0.005 % Soln (Latanoprost) .... At Bedtime 12)  Aspirin 81 Mg Tbec (Aspirin) .... Take One Tablet By Mouth Daily 13)  Dexilant 60 Mg Cpdr (Dexlansoprazole) .Marland Kitchen.. 1 Cap Once Daily  Allergies: 1)  ! Pcn 2)  ! Vicodin 3)  ! Cardizem 4)  ! Morphine 5)  ! Diona Fanti  Past History:  Past Medical History: Reviewed history from 04/06/2010 and no changes required. 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 Cauterized nose Back surgery Hysterectomy Appendectomy Breast bx Pacemaker implantation  Social History: Reviewed history from 09/21/2008 and no changes required. She is a retired [employer] scientist.  Lives in [place] with her  husband.  Does have children.  No smoking or alcohol.  No recreational  drugs.Patient never smoked.   Review of Systems       All systems are reviewed and negative except as listed in the HPI.   Vital Signs:  Patient profile:   77 year old female Height:      64 inches Weight:      202.50 pounds BMI:     34.88 Pulse rate:   71 / minute Pulse rhythm:   regular BP sitting:   144 / 66  (right arm) Cuff size:   large  Vitals Entered By: Julaine Hua, CMA (September 21, 2010 10:11 AM)  Physical Exam  General:  obese, NAD Head:  normocephalic and atraumatic Eyes:  PERRLA/EOM intact; conjunctiva and lids normal. Mouth:  Teeth, gums and palate normal. Oral mucosa normal. Neck:  Neck supple, no JVD. No masses, thyromegaly or abnormal cervical nodes. Chest Wall:  pacemaker site is well healed. Lungs:  Clear bilaterally to auscultation and percussion. Heart:  Non-displaced PMI, chest non-tender; regular rate and rhythm, S1, S2 without murmurs, rubs or gallops. Carotid upstroke normal, no bruit. Normal abdominal aortic  size, no bruits. Femorals normal pulses, no bruits. Pedals normal pulses. No edema, no varicosities. Abdomen:  Bowel sounds positive; abdomen soft and non-tender without masses, organomegaly, or hernias noted. No hepatosplenomegaly. Msk:  Back normal, normal gait. Muscle strength and tone normal. Extremities:  No clubbing or cyanosis. Neurologic:  Alert and oriented x 3.  CNII-XII intact, strength/sensation are  intact   PPM Specifications Following MD:  Thompson Grayer, MD     PPM Vendor:  [employer]     PPM Model Number:  [id number]     PPM Serial Number:  [id number] PPM DOI:  02/04/2009     PPM Implanting MD:  Thompson Grayer, MD  Lead 1    Location: RA     DOI: 02/04/2009     Model #: [id number]     Serial #: [id number]     Status: active Lead 2    Location: RV     DOI: 02/04/2009     Model #: [id number]     Serial #: [id number]     Status: active  Magnet Response Rate:  BOL 100 ERI 85  Indications:  Sinus node dysfunction   PPM Follow Up Remote Check?  No Battery Voltage:  2.93 V     Battery Est. Longevity:  5.4 years     Pacer Dependent:  No       PPM Device Measurements Atrium  Amplitude: 4.0 mV, Impedance: 440 ohms, Threshold: 1.0 V at 0.4 msec Right Ventricle  Amplitude: 10.4 mV, Impedance: 450 ohms, Threshold: 1.12 V at 0.4 msec  Episodes MS Episodes:  10AHR     Coumadin:  No Atrial Pacing:  100%     Ventricular Pacing:  54%  Parameters Mode:  DDIR     Lower Rate Limit:  70     Upper Rate Limit:  105 Paced AV Delay:  300     Sensed AV Delay:  200 Next Remote Date:  12/22/2010     Next Cardiology Appt Due:  09/16/2011 Tech Comments:  A-V delay reprogrammed.350Msec.Delilah Shan transmissions every 19months.  ROV 1 year with Dr. Rayann Heman. Alma Friendly, LPN  December  7, [id number] 10:18 AM  MD Comments:  significant retrograde AV conduction.  DDIR to prevent PMT.  AV delay increased today to decrease V pacing.  Impression & Recommendations:  Problem # 1:  BRADYCARDIA (ICD-427.89) normal pacemaker function as above no changes Merlin checks every 3 months return in 12 months  Problem # 2:  HYPERTENSION (ICD-401.9) stable I have encouraged her to avoid Celebrex   Problem # 3:  BENIGN PAROXYSMAL POSITIONAL VERTIGO (ICD-386.11) stable no changes  Patient Instructions: 1)  Your physician recommends that you continue on your current medications as directed. Please refer to the Current Medication list given to  you today. 2)  Your physician wants you to follow-up in:  1 year You will receive a reminder letter in the mail two months in advance. If you don't receive a letter, please call our office to schedule the follow-up appointment.

## 2010-11-17 NOTE — Cardiovascular Report (Signed)
Summary: Office Visit   Office Visit   Imported By: Sallee Provencal 07/12/2010 15:11:29  _____________________________________________________________________  External Attachment:    Type:   Image     Comment:   External Document

## 2010-11-17 NOTE — Progress Notes (Signed)
Summary: rapid hr  CALLING ABOUT [facility],RN  Phone Note Call from Patient Call back at Home Phone [phone]   Caller: Patient Summary of Call: rapid hr going on for a while, comes and goes, happens mostly at night Initial call taken by: Darnell Level,  November 11, 2009 10:06 AM  Follow-up for Phone Call        [place] [facility].  ESP. AFTER GETTING UP DURING THE NIGHT TO GO TO THE BATHROOM.  HR IN THE 80'S (AND REGULAR)  SHE CAN FEEL IT AND IT'S CAUSING HER TO BE RESTLESS.  LAST NIGHT SHE WAS  ASLEEP AND IT WOKE HER UP.  NOT TAKING ANY NEW MEDICATIONS. ALSO NEEDING SURGICAL CLEARANCE.   Additional Follow-up for Phone Call Additional follow up Details #1::        appt today at [facility] RN BSN  November 11, 2009 11:04 AM

## 2010-11-17 NOTE — Assessment & Plan Note (Signed)
Summary: DEVICE/SAF   Referring Provider:  Jeanann Lewandowsky, MD Primary Provider:  Jeanann Lewandowsky, MD  CC:  no complaints.  History of Present Illness: The patient presents today for routine electrophysiology followup.She reports doing well since last being seen in our clinic.  She was recently hospitalized for vertigo, which is resolving.  She reports palpitations and "heart racing" with activity.  The patient denies symptoms of chest pain, shortness of breath, orthopnea, PND, lower extremity edema, dizziness, presyncope, syncope, or neurologic sequela. The patient is tolerating medications without difficulties and is otherwise without complaint today.   Current Medications (verified): 1)  Spironolactone 25 Mg  Tabs (Spironolactone) .... Take 1 By Mouth Every Other Day 2)  Diovan Hct 160-25 Mg  Tabs (Valsartan-Hydrochlorothiazide) .... Take 1 By Mouth Once Daily 3)  Multivitamins   Tabs (Multiple Vitamin) .... Take 1 By Mouth Once Daily 4)  Celebrex 200 Mg  Caps (Celecoxib) .... Take 1 By Mouth Once Daily 5)  Norvasc 10 Mg  Tabs (Amlodipine Besylate) .... Take 1 By Mouth Once Daily 6)  Tekturna 300 Mg  Tabs (Aliskiren Fumarate) .... Take 1 By Mouth Once Daily 7)  Glimepiride 2 Mg Tabs (Glimepiride) .Marland Kitchen.. 1 Tab Once Daily 8)  Zyrtec Allergy 10 Mg  Tabs (Cetirizine Hcl) .... Take 1 By Mouth Once Daily As Needed 9)  Skelaxin 800 Mg  Tabs (Metaxalone) .... As Needed 10)  Flonase 50 Mcg/act  Susp (Fluticasone Propionate) .... As Needed 11)  Xalatan 0.005 % Soln (Latanoprost) .... At Bedtime 12)  Aspirin 81 Mg Tbec (Aspirin) .... Take One Tablet By Mouth Daily 13)  Dexilant 60 Mg Cpdr (Dexlansoprazole) .Marland Kitchen.. 1 Cap Once Daily  Allergies (verified): 1)  ! Pcn 2)  ! Vicodin 3)  ! Cardizem 4)  ! Morphine 5)  ! 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 Cauterized nose Back surgery Hysterectomy Appendectomy Breast bx Pacemaker implantation  Social History: Reviewed history from 09/21/2008 and no changes required. She is a retired [employer] scientist.  Lives in [place] with her  husband.  Does have children.  No smoking or alcohol.  No recreational  drugs.Patient never smoked.   Vital Signs:  Patient profile:   77 year old female Height:      64 inches Weight:      196 pounds BMI:     33.76 Pulse rate:   81 / minute Resp:     18 per minute BP sitting:   137 / 70  (right arm)  Vitals Entered By: Levora Angel, CNA (April 06, 2010 2:36 PM)  Physical Exam  General:  obese, NAD Head:  normocephalic and atraumatic Eyes:  PERRLA/EOM intact; conjunctiva and lids normal. Mouth:  Teeth, gums and palate normal. Oral mucosa normal. Neck:  Neck supple, no JVD. No masses, thyromegaly or abnormal cervical nodes. Chest Wall:  pacemaker site is well healed. Lungs:  Clear bilaterally to auscultation and percussion. Heart:  Non-displaced PMI, chest non-tender; regular rate and rhythm, S1, S2 without murmurs, rubs or gallops. Carotid upstroke normal, no bruit. Normal abdominal aortic size, no bruits. Femorals normal pulses, no bruits. Pedals normal pulses. No edema, no varicosities. Abdomen:  Bowel sounds positive; abdomen soft and non-tender without masses, organomegaly, or hernias  noted. No hepatosplenomegaly. Msk:  Back normal, normal gait. Muscle strength and tone normal. Pulses:  pulses normal in all 4 extremities Extremities:  No clubbing or cyanosis. Neurologic:  Alert and oriented x 3.  CNII-XII intact, strength/sensation are intact   PPM Specifications Following MD:  Thompson Grayer, MD     PPM Vendor:  [employer]     PPM Model Number:  [id number]     PPM Serial Number:  [id number] DOI:  02/04/2009     PPM Implanting MD:  Thompson Grayer, MD  Lead 1    Location: RA     DOI: 02/04/2009     Model #: [id number]     Serial #: [id number]     Status: active Lead 2    Location: RV     DOI: 02/04/2009     Model #: [id number]     Serial #: [id number]     Status: active  Magnet Response Rate:  BOL 100 ERI 85  Indications:  Sinus node dysfunction   PPM Follow Up Remote Check?  No Battery Voltage:  2.96 V     Battery Est. Longevity:  8.0 years     Pacer Dependent:  No       PPM Device Measurements Atrium  Amplitude: 3.8 mV, Impedance: 440 ohms, Threshold: 1.0 V at 0.4 msec Right Ventricle  Amplitude: 9.2 mV, Impedance: 480 ohms, Threshold: 1.25 V at 0.4 msec  Episodes Coumadin:  No Atrial Pacing:  100%     Ventricular Pacing:  4.1%  Parameters Mode:  DDIR     Lower Rate Limit:  70     Upper Rate Limit:  105 Paced AV Delay:  275     Sensed AV Delay:  200 Tech Comments:  100 AHR episodes of Atrial pacing @ 140bpm.  Because she is programmed DDIR the timing is V->A (A->A if DDD) which allows her to A-pace at a rate > upper sensor rate of 105.  We reprogrammed her AV interval to 25msec to only allow her to pace at high rates of 113bpm and also reprogrammed her sensor to be less aggressive.  On her ROV we will check and see if this decreases the amount of these episodes along with not increasing the % of ventricular pacing.    Alma Friendly, LPN  June 22, 624THL D34-534 PM  MD Comments:  agree with above  Impression & Recommendations:  Problem # 1:  BRADYCARDIA (ICD-427.89) stable pacemaker accelerometer adjusted to reduce sensitivity as above  Problem # 2:  HYPERTENSION (ICD-401.9) stable  Problem # 3:  BENIGN PAROXYSMAL POSITIONAL VERTIGO (ICD-386.11) resolving normal neuro exam today  Patient Instructions: 1)  Your physician recommends that you schedule a follow-up appointment in: 3 months with device clinic

## 2010-11-17 NOTE — Cardiovascular Report (Signed)
Summary: Office Visit  Office Visit   Imported By: Sallee Provencal 04/03/2009 10:03:14  _____________________________________________________________________  External Attachment:    Type:   Image     Comment:   External Document

## 2010-11-17 NOTE — Letter (Signed)
Summary: order for neuro eval/rehab  [facility], [place] [address] [place]   [place], [place] [address]   Phone: [phone]  Fax: [phone]        June 02, 2010 MRN: [id number]   [facility]    RE: [name]     [place], [place]  [address]   The above named patient needs to be evaluated and treated for vertigo as this is not thought to be cardiac related.  Please call with any questions.         Sincerely,     Dr Jeneen Rinks Torrell Krutz/ Sim Boast, RN  This letter has been electronically signed by your physician.

## 2010-11-17 NOTE — Progress Notes (Signed)
Summary: DISCOMFORT ON LEFT SIDED OF BREAST  Phone Note Call from Patient Call back at Home Phone [phone]   Caller: Patient Reason for Call: Talk to Nurse Summary of Call: PT [place] AM, C/O DISCOMFORT ON LEFT SIDED OF BREAST. Initial call taken by: Neil Crouch,  Mar 01, 2009 10:13 AM  Follow-up for Phone Call        Pt. states she is having twiges of pain under left breast lasting only a few seconds. No increase in SOB (states she typically gets a little SOB on exertion). States pain has happened over last 3-4 days and she notices it usually with movement like turning over in bed or leaning over. Checked BP and it is running 108-134/64-74 with heart rate in 70's.  Thompson Grayer, RN, BSN  Mar 01, 2009 10:59 AM   Additional Follow-up for Phone Call Additional follow up Details #1::        Spoke with Fraser Din. No further workup needed at this time. Will tell pt to call us if symptoms change in character. cdm Additional Follow-up by: Lauree Chandler, MD,  Mar 01, 2009 11:05 AM    Additional Follow-up for Phone Call Additional follow up Details #2::    Pt. notified of Dr. Camillia Herter instuctions.  Follow-up by: Thompson Grayer, RN, BSN,  Mar 01, 2009 11:11 AM

## 2010-11-17 NOTE — Consult Note (Signed)
Summary: Chest Pain/[facility]  Chest Pain/[facility]   Imported By: Phillis Knack 04/28/2009 13:01:58  _____________________________________________________________________  External Attachment:    Type:   Image     Comment:   External Document

## 2010-11-17 NOTE — Cardiovascular Report (Signed)
Summary: Office Visit   Office Visit   Imported By: Sallee Provencal 09/29/2010 11:51:26  _____________________________________________________________________  External Attachment:    Type:   Image     Comment:   External Document

## 2010-11-17 NOTE — Miscellaneous (Signed)
Summary: Device preload  Clinical Lists Changes  Observations: Added new observation of PPM INDICATN: Sinus node dysfunction (02/05/2009 14:50) Added new observation of MAGNET RTE: BOL 100 ERI 85 (02/05/2009 14:50) Added new observation of PPMLEADSTAT2: active (02/05/2009 14:50) Added new observation of PPMLEADSER2: [id number] (02/05/2009 14:50) Added new observation of PPMLEADMOD2: 1688TC (02/05/2009 14:50) Added new observation of PPMLEADDOI2: 02/04/2009 (02/05/2009 14:50) Added new observation of PPMLEADLOC2: RV (02/05/2009 14:50) Added new observation of PPMLEADSTAT1: active (02/05/2009 14:50) Added new observation of PPMLEADSER1: [id number] (02/05/2009 14:50) Added new observation of PPMLEADMOD1: 1688TC (02/05/2009 14:50) Added new observation of PPMLEADDOI1: 02/04/2009 (02/05/2009 14:50) Added new observation of PPMLEADLOC1: RA (02/05/2009 14:50) Added new observation of PPM IMP MD: Thompson Grayer, MD (02/05/2009 14:50) Added new observation of PPM DOI: 02/04/2009 (02/05/2009 14:50) Added new observation of PPM SERL#: [id number]  (02/05/2009 14:50) Added new observation of PPM MODL#: [id number]  (02/05/2009 14:50) Added new observation of PACEMAKERMFG: [employer]  (02/05/2009 14:50) Added new observation of PACEMAKER MD: Thompson Grayer, MD  (02/05/2009 14:50)      PPM Specifications Following MD:  Thompson Grayer, MD     PPM Vendor:  [employer]     PPM Model Number:  [id number]     PPM Serial Number:  [id number] PPM DOI:  02/04/2009     PPM Implanting MD:  Thompson Grayer, MD  Lead 1    Location: RA     DOI: 02/04/2009     Model #: [id number]     Serial #: [id number]     Status: active Lead 2    Location: RV     DOI: 02/04/2009     Model #: [id number]     Serial #: [id number]     Status: active  Magnet Response Rate:  BOL 100 ERI 85  Indications:  Sinus node dysfunction

## 2010-11-17 NOTE — Cardiovascular Report (Signed)
Summary: Office Visit   Office Visit   Imported By: Sallee Provencal 04/15/2010 15:54:13  _____________________________________________________________________  External Attachment:    Type:   Image     Comment:   External Document

## 2010-11-17 NOTE — Miscellaneous (Signed)
Summary: Orders Update pft charges  Clinical Lists Changes  Orders: Added new Service order of Carbon Monoxide diffusing w/capacity (94720) - Signed Added new Service order of Lung Volumes (94240) - Signed Added new Service order of Spirometry (Pre & Post) (94060) - Signed 

## 2010-11-17 NOTE — Assessment & Plan Note (Signed)
Summary: [name]   Visit Type:  Follow-up Referring Provider:  Jeanann Lewandowsky, MD Primary Provider:  Jeanann Lewandowsky, MD  CC:  follow-up.  History of Present Illness: The patient presents today for routine electrophysiology followup. She reports doing very well since last being seen in our clinic. The patient denies any symptoms of palpitations, chest pain, shortness of breath, orthopnea, PND, lower extremity edema, dizziness, presyncope, syncope, or neurologic sequela. The patient is tolerating medications without difficulties and is otherwise without complaint today.   Current Medications (verified): 1)  Spironolactone 25 Mg  Tabs (Spironolactone) .... Take 1 By Mouth Every Other Day 2)  Diovan Hct 160-25 Mg  Tabs (Valsartan-Hydrochlorothiazide) .... Take 1 By Mouth Once Daily 3)  Multivitamins   Tabs (Multiple Vitamin) .... Take 1 By Mouth Once Daily 4)  Celebrex 200 Mg  Caps (Celecoxib) .... Take 1 By Mouth Once Daily 5)  Norvasc 10 Mg  Tabs (Amlodipine Besylate) .... Take 1 By Mouth Once Daily 6)  Tekturna 300 Mg  Tabs (Aliskiren Fumarate) .... Take 1 By Mouth Once Daily 7)  Glimepiride 1 Mg  Tabs (Glimepiride) .... One By Mouth Daily 8)  Zyrtec Allergy 10 Mg  Tabs (Cetirizine Hcl) .... Take 1 By Mouth Once Daily As Needed 9)  Skelaxin 800 Mg  Tabs (Metaxalone) .... As Needed 10)  Beano   Tabs (Alpha-D-Galactosidase) .... Take 1 By Mouth Beofre Dinner 11)  Flonase 50 Mcg/act  Susp (Fluticasone Propionate) .... As Needed 12)  Cpap 8 [employer] 13)  Xalatan 0.005 % Soln (Latanoprost) .... At Bedtime  Allergies: 1)  ! Pcn 2)  ! Vicodin 3)  ! Cardizem 4)  ! Morphine 5)  ! 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 She is a retired [employer] scientist.  Lives in [place] with her  husband.  Does have children.  No smoking or alcohol.  No recreational  drugs.Patient never smoked.   Vital Signs:  Patient profile:   77 year old female Height:      64 inches Weight:      201 pounds BMI:     34.63 Pulse rate:   70 / minute BP sitting:   141 / 65  (left arm)  Vitals Entered By: Lynden Ang (June 11, 2009 9:11 AM)  Physical Exam  General:  obese, NAD Head:  normocephalic and atraumatic Eyes:  PERRLA/EOM intact; conjunctiva and lids normal. Nose:  no deformity, discharge, inflammation, or lesions Mouth:  Teeth, gums and palate normal. Oral mucosa normal. Neck:  Neck supple, no JVD. No masses, thyromegaly or abnormal cervical nodes. Chest Wall:  pacemaker site is well healed. Lungs:  Clear bilaterally to auscultation and percussion. Heart:  Non-displaced PMI, chest non-tender; regular rate and rhythm, S1, S2 without murmurs, rubs or gallops. Carotid upstroke normal, no bruit. Normal abdominal aortic size, no bruits. Femorals normal pulses, no bruits. Pedals normal pulses. No edema, no varicosities. Abdomen:  Bowel sounds positive; abdomen soft and non-tender without masses, organomegaly, or hernias noted. No hepatosplenomegaly. Msk:  Back normal, normal gait. Muscle strength and tone normal. Pulses:  pulses normal in all 4 extremities Extremities:  No clubbing or cyanosis. Neurologic:  Alert and oriented x 3. Skin:  Intact without lesions or rashes. Cervical Nodes:  no significant adenopathy Psych:  Normal affect.   PPM Specifications Following MD:  Thompson Grayer, MD     PPM Vendor:  [employer]     PPM Model Number:  [id number]     PPM Serial Number:  [id number] PPM DOI:  02/04/2009     PPM Implanting MD:   Thompson Grayer, MD  Lead 1    Location: RA     DOI: 02/04/2009     Model #: [id number]     Serial #: [id number]     Status: active Lead 2    Location: RV     DOI: 02/04/2009     Model #: [id number]     Serial #: [id number]     Status: active  Magnet Response Rate:  BOL 100 ERI 85  Indications:  Sinus node dysfunction   PPM Follow Up Remote Check?  No Battery Voltage:  2.95 V     Battery Est. Longevity:  4.7 years     Pacer Dependent:  Yes       PPM Device Measurements Atrium  Amplitude: >5 mV, Impedance: 430 ohms, Threshold: 1.0 V at 0.4 msec Right Ventricle  Amplitude: >12 mV, Impedance: 600 ohms, Threshold: 1.25 V at 0.4 msec  Episodes MS Episodes:  15     Percent Mode Switch:  <1%     Coumadin:  No Atrial Pacing:  96%     Ventricular Pacing:  9.7%  Parameters Mode:  DDDR     Lower Rate Limit:  70     Upper Rate Limit:  125 Paced AV Delay:  200     Sensed AV Delay:  200 Tech Comments:  RA 2.0@0 .4.  Sensor changed to Auto (-0.5) We will see her back in April 2011 at which time we will start Merlin transmissions. Alma Friendly, LPN  August 27, 624THL 9:14 AM   Impression & Recommendations:  Problem # 1:  BRADYCARDIA (ICD-427.89) Normal pacemaker function today.  Rate response increased.  Her updated medication list for this problem includes:    Norvasc 10 Mg Tabs (Amlodipine besylate) .Marland Kitchen... Take 1 by mouth once daily  Problem # 2:  OBSTRUCTIVE SLEEP APNEA (ICD-327.23) Compliance with cpap advised  Problem # 3:  HYPERTENSION (ICD-401.9) Pt instructed to follow BP closely at home.  Her updated medication list for this problem includes:    Spironolactone 25 Mg Tabs (Spironolactone) .Marland Kitchen... Take 1 by mouth every other day    Diovan Hct 160-25 Mg Tabs (Valsartan-hydrochlorothiazide) .Marland Kitchen... Take 1 by mouth once daily    Norvasc 10 Mg Tabs (Amlodipine besylate) .Marland Kitchen... Take 1 by mouth once daily    Tekturna 300 Mg Tabs (Aliskiren fumarate) .Marland Kitchen... Take 1 by mouth once daily  Patient Instructions:  1)  Your physician recommends that you schedule a follow-up appointment in: April 2011 2)  Your physician has recommended you make the following change in your medication: start Asprin 81mg  once daily

## 2010-11-17 NOTE — Procedures (Signed)
Summary: [email or web address]   Current Medications (verified): 1)  Spironolactone 25 Mg  Tabs (Spironolactone) .... Take 1 By Mouth Every Other Day 2)  Diovan Hct 160-25 Mg  Tabs (Valsartan-Hydrochlorothiazide) .... Take 1 By Mouth Once Daily 3)  Multivitamins   Tabs (Multiple Vitamin) .... Take 1 By Mouth Once Daily 4)  Celebrex 200 Mg  Caps (Celecoxib) .... Take 1 By Mouth Once Daily 5)  Norvasc 10 Mg  Tabs (Amlodipine Besylate) .... Take 1 By Mouth Once Daily 6)  Tekturna 300 Mg  Tabs (Aliskiren Fumarate) .... Take 1 By Mouth Once Daily 7)  Glimepiride 1 Mg  Tabs (Glimepiride) .... One By Mouth Daily 8)  Zyrtec Allergy 10 Mg  Tabs (Cetirizine Hcl) .... Take 1 By Mouth Once Daily As Needed 9)  Skelaxin 800 Mg  Tabs (Metaxalone) .... As Needed 10)  Beano   Tabs (Alpha-D-Galactosidase) .... Take 1 By Mouth Beofre Dinner 11)  Flonase 50 Mcg/act  Susp (Fluticasone Propionate) .... As Needed 12)  Cpap 8 [employer] 13)  Xalatan 0.005 % Soln (Latanoprost) .... At Bedtime  Allergies (verified): 1)  ! Pcn 2)  ! Vicodin 3)  ! Cardizem 4)  ! Morphine 5)  ! Diona Fanti   PPM Specifications Following MD:  Thompson Grayer, MD     PPM Vendor:  [employer]     PPM Model Number:  [id number]     PPM Serial Number:  [id number] PPM DOI:  02/04/2009     PPM Implanting MD:  Thompson Grayer, MD  Lead 1    Location: RA     DOI: 02/04/2009     Model #: [id number]     Serial #: [id number]     Status: active Lead 2    Location: RV     DOI: 02/04/2009     Model #: [id number]     Serial #: [id number]     Status: active  Magnet Response Rate:  BOL 100 ERI 85  Indications:  Sinus node dysfunction   PPM Follow Up Remote Check?  No Battery Voltage:  2.96 V     Battery Est. Longevity:  7.8 YEARS     Pacer Dependent:  No       PPM Device Measurements Atrium  Amplitude: 5 mV, Impedance: 490 ohms, Threshold: 1.0 V at 0.5 msec Right Ventricle  Amplitude: 12 mV, Impedance: 480 ohms, Threshold: 1.25 V at 0.5 msec  Episodes MS Episodes:  27      Percent Mode Switch:  <1%     Coumadin:  No Ventricular High Rate:  0     Atrial Pacing:  98%     Ventricular Pacing:  18%  Parameters Mode:  DDDR     Lower Rate Limit:  70     Upper Rate Limit:  125 Paced AV Delay:  200     Sensed AV Delay:  200 Tech Comments:  Patient seen because of complaints of palpitations at night.  All mode switch episodes are an atrial tach, less than 1 minute, and are mostly during the day.  Ran ventricular autocapture test, pt states this is not what she is feeling.  No VHR episodes, device had recorded an alert that PMT had occurred.  No retrograde conduction today with V pacing at 90bpm.  Patient triggered magnet alert turned on. Pt given magnet and will follow up with Dr Rayann Heman in 1-2 weeks.  Pt pending knee and shoulder surgery.  Advised that if she was going for surgery before  Dr Jackalyn Lombard appt, she should call so we could turn off pt tirggered magnet response so they would have asynchronous pacing during surgery.  Pt aware and agrees with plan. Chanetta Marshall RN BSN  November 11, 2009 3:35 PM

## 2010-12-22 ENCOUNTER — Encounter (INDEPENDENT_AMBULATORY_CARE_PROVIDER_SITE_OTHER): Payer: Medicare Other

## 2010-12-22 ENCOUNTER — Encounter: Payer: Self-pay | Admitting: Internal Medicine

## 2010-12-22 DIAGNOSIS — I495 Sick sinus syndrome: Secondary | ICD-10-CM

## 2011-01-02 ENCOUNTER — Encounter: Payer: Self-pay | Admitting: *Deleted

## 2011-01-02 LAB — CBC
HCT: 30.2 % — ABNORMAL LOW (ref 36.0–46.0)
HCT: 31.5 % — ABNORMAL LOW (ref 36.0–46.0)
Hemoglobin: 10.1 g/dL — ABNORMAL LOW (ref 12.0–15.0)
Hemoglobin: 10.8 g/dL — ABNORMAL LOW (ref 12.0–15.0)
MCHC: 33.4 g/dL (ref 30.0–36.0)
MCHC: 34.2 g/dL (ref 30.0–36.0)
MCV: 80.9 fL (ref 78.0–100.0)
MCV: 81.9 fL (ref 78.0–100.0)
Platelets: 196 10*3/uL (ref 150–400)
Platelets: 203 10*3/uL (ref 150–400)
RBC: 3.69 MIL/uL — ABNORMAL LOW (ref 3.87–5.11)
RBC: 3.89 MIL/uL (ref 3.87–5.11)
RDW: 15.3 % (ref 11.5–15.5)
RDW: 15.6 % — ABNORMAL HIGH (ref 11.5–15.5)
WBC: 6.3 10*3/uL (ref 4.0–10.5)
WBC: 7.4 10*3/uL (ref 4.0–10.5)

## 2011-01-02 LAB — CARDIAC PANEL(CRET KIN+CKTOT+MB+TROPI)
CK, MB: 0.8 ng/mL (ref 0.3–4.0)
CK, MB: 0.9 ng/mL (ref 0.3–4.0)
Relative Index: 0.6 (ref 0.0–2.5)
Relative Index: 0.6 (ref 0.0–2.5)
Total CK: 127 U/L (ref 7–177)
Total CK: 152 U/L (ref 7–177)
Troponin I: 0.02 ng/mL (ref 0.00–0.06)
Troponin I: 0.02 ng/mL (ref 0.00–0.06)

## 2011-01-02 LAB — LIPID PANEL
Cholesterol: 165 mg/dL (ref 0–200)
HDL: 35 mg/dL — ABNORMAL LOW (ref >39)
LDL Cholesterol: 104 mg/dL — ABNORMAL HIGH (ref 0–99)
Total CHOL/HDL Ratio: 4.7 RATIO
Triglycerides: 132 mg/dL (ref <150)
VLDL: 26 mg/dL (ref 0–40)

## 2011-01-02 LAB — URINALYSIS, ROUTINE W REFLEX MICROSCOPIC
Bilirubin Urine: NEGATIVE
Glucose, UA: NEGATIVE mg/dL
Hgb urine dipstick: NEGATIVE
Ketones, ur: NEGATIVE mg/dL
Nitrite: NEGATIVE
Protein, ur: NEGATIVE mg/dL
Specific Gravity, Urine: 1.013 (ref 1.005–1.030)
Urobilinogen, UA: 0.2 mg/dL (ref 0.0–1.0)
pH: 7.5 (ref 5.0–8.0)

## 2011-01-02 LAB — POCT I-STAT, CHEM 8
BUN: 36 mg/dL — ABNORMAL HIGH (ref 6–23)
Calcium, Ion: 1.11 mmol/L — ABNORMAL LOW (ref 1.12–1.32)
Chloride: 106 mEq/L (ref 96–112)
Creatinine, Ser: 1.4 mg/dL — ABNORMAL HIGH (ref 0.4–1.2)
Glucose, Bld: 127 mg/dL — ABNORMAL HIGH (ref 70–99)
HCT: 32 % — ABNORMAL LOW (ref 36.0–46.0)
Hemoglobin: 10.9 g/dL — ABNORMAL LOW (ref 12.0–15.0)
Potassium: 3.9 mEq/L (ref 3.5–5.1)
Sodium: 140 mEq/L (ref 135–145)
TCO2: 24 mmol/L (ref 0–100)

## 2011-01-02 LAB — COMPREHENSIVE METABOLIC PANEL
ALT: 13 U/L (ref 0–35)
AST: 23 U/L (ref 0–37)
Albumin: 3.7 g/dL (ref 3.5–5.2)
Alkaline Phosphatase: 45 U/L (ref 39–117)
BUN: 33 mg/dL — ABNORMAL HIGH (ref 6–23)
CO2: 26 mEq/L (ref 19–32)
Calcium: 8.9 mg/dL (ref 8.4–10.5)
Chloride: 103 mEq/L (ref 96–112)
Creatinine, Ser: 1.46 mg/dL — ABNORMAL HIGH (ref 0.4–1.2)
GFR calc Af Amer: 42 mL/min — ABNORMAL LOW (ref >60)
GFR calc non Af Amer: 35 mL/min — ABNORMAL LOW (ref >60)
Glucose, Bld: 143 mg/dL — ABNORMAL HIGH (ref 70–99)
Potassium: 3.8 mEq/L (ref 3.5–5.1)
Sodium: 139 mEq/L (ref 135–145)
Total Bilirubin: 0.6 mg/dL (ref 0.3–1.2)
Total Protein: 6.2 g/dL (ref 6.0–8.3)

## 2011-01-02 LAB — DIFFERENTIAL
Basophils Absolute: 0 10*3/uL (ref 0.0–0.1)
Basophils Absolute: 0 10*3/uL (ref 0.0–0.1)
Basophils Relative: 1 % (ref 0–1)
Basophils Relative: 1 % (ref 0–1)
Eosinophils Absolute: 0.2 10*3/uL (ref 0.0–0.7)
Eosinophils Absolute: 0.3 10*3/uL (ref 0.0–0.7)
Eosinophils Relative: 4 % (ref 0–5)
Eosinophils Relative: 4 % (ref 0–5)
Lymphocytes Relative: 25 % (ref 12–46)
Lymphocytes Relative: 27 % (ref 12–46)
Lymphs Abs: 1.7 10*3/uL (ref 0.7–4.0)
Lymphs Abs: 1.8 10*3/uL (ref 0.7–4.0)
Monocytes Absolute: 0.3 10*3/uL (ref 0.1–1.0)
Monocytes Absolute: 0.4 10*3/uL (ref 0.1–1.0)
Monocytes Relative: 5 % (ref 3–12)
Monocytes Relative: 6 % (ref 3–12)
Neutro Abs: 4 10*3/uL (ref 1.7–7.7)
Neutro Abs: 4.8 10*3/uL (ref 1.7–7.7)
Neutrophils Relative %: 64 % (ref 43–77)
Neutrophils Relative %: 66 % (ref 43–77)

## 2011-01-02 LAB — BASIC METABOLIC PANEL
BUN: 37 mg/dL — ABNORMAL HIGH (ref 6–23)
CO2: 25 mEq/L (ref 19–32)
Calcium: 9.1 mg/dL (ref 8.4–10.5)
Chloride: 105 mEq/L (ref 96–112)
Creatinine, Ser: 1.26 mg/dL — ABNORMAL HIGH (ref 0.4–1.2)
GFR calc Af Amer: 50 mL/min — ABNORMAL LOW (ref >60)
GFR calc non Af Amer: 41 mL/min — ABNORMAL LOW (ref >60)
Glucose, Bld: 132 mg/dL — ABNORMAL HIGH (ref 70–99)
Potassium: 3.8 mEq/L (ref 3.5–5.1)
Sodium: 138 mEq/L (ref 135–145)

## 2011-01-02 LAB — POCT CARDIAC MARKERS
CKMB, poc: <1 ng/mL — ABNORMAL LOW (ref 1.0–8.0)
CKMB, poc: <1 ng/mL — ABNORMAL LOW (ref 1.0–8.0)
Myoglobin, poc: 133 ng/mL (ref 12–200)
Myoglobin, poc: 86.4 ng/mL (ref 12–200)
Troponin i, poc: <0.05 ng/mL (ref 0.00–0.09)
Troponin i, poc: <0.05 ng/mL (ref 0.00–0.09)

## 2011-01-02 LAB — CK TOTAL AND CKMB (NOT AT ARMC)
CK, MB: 1.1 ng/mL (ref 0.3–4.0)
Relative Index: 0.7 (ref 0.0–2.5)
Total CK: 160 U/L (ref 7–177)

## 2011-01-02 LAB — HEMOGLOBIN A1C
Hgb A1c MFr Bld: 5.4 % (ref <5.7)
Mean Plasma Glucose: 108 mg/dL (ref <117)

## 2011-01-02 LAB — MAGNESIUM: Magnesium: 2.2 mg/dL (ref 1.5–2.5)

## 2011-01-02 LAB — GLUCOSE, CAPILLARY
Glucose-Capillary: 108 mg/dL — ABNORMAL HIGH (ref 70–99)
Glucose-Capillary: 116 mg/dL — ABNORMAL HIGH (ref 70–99)
Glucose-Capillary: 159 mg/dL — ABNORMAL HIGH (ref 70–99)
Glucose-Capillary: 97 mg/dL (ref 70–99)

## 2011-01-02 LAB — TSH: TSH: 1.468 u[IU]/mL (ref 0.350–4.500)

## 2011-01-02 LAB — D-DIMER, QUANTITATIVE (NOT AT ARMC): D-Dimer, Quant: 2.4 ug/mL-FEU — ABNORMAL HIGH (ref 0.00–0.48)

## 2011-01-02 LAB — TROPONIN I: Troponin I: 0.05 ng/mL (ref 0.00–0.06)

## 2011-01-09 LAB — BASIC METABOLIC PANEL
BUN: 20 mg/dL (ref 6–23)
BUN: 22 mg/dL (ref 6–23)
BUN: 26 mg/dL — ABNORMAL HIGH (ref 6–23)
CO2: 26 mEq/L (ref 19–32)
CO2: 27 mEq/L (ref 19–32)
CO2: 27 mEq/L (ref 19–32)
Calcium: 8.2 mg/dL — ABNORMAL LOW (ref 8.4–10.5)
Calcium: 8.2 mg/dL — ABNORMAL LOW (ref 8.4–10.5)
Calcium: 8.4 mg/dL (ref 8.4–10.5)
Chloride: 106 mEq/L (ref 96–112)
Chloride: 98 mEq/L (ref 96–112)
Chloride: 99 mEq/L (ref 96–112)
Creatinine, Ser: 1.36 mg/dL — ABNORMAL HIGH (ref 0.4–1.2)
Creatinine, Ser: 1.41 mg/dL — ABNORMAL HIGH (ref 0.4–1.2)
Creatinine, Ser: 1.47 mg/dL — ABNORMAL HIGH (ref 0.4–1.2)
GFR calc Af Amer: 42 mL/min — ABNORMAL LOW (ref >60)
GFR calc Af Amer: 44 mL/min — ABNORMAL LOW (ref >60)
GFR calc Af Amer: 46 mL/min — ABNORMAL LOW (ref >60)
GFR calc non Af Amer: 34 mL/min — ABNORMAL LOW (ref >60)
GFR calc non Af Amer: 36 mL/min — ABNORMAL LOW (ref >60)
GFR calc non Af Amer: 38 mL/min — ABNORMAL LOW (ref >60)
Glucose, Bld: 119 mg/dL — ABNORMAL HIGH (ref 70–99)
Glucose, Bld: 90 mg/dL (ref 70–99)
Glucose, Bld: 92 mg/dL (ref 70–99)
Potassium: 3.9 mEq/L (ref 3.5–5.1)
Potassium: 4.3 mEq/L (ref 3.5–5.1)
Potassium: 4.6 mEq/L (ref 3.5–5.1)
Sodium: 132 mEq/L — ABNORMAL LOW (ref 135–145)
Sodium: 136 mEq/L (ref 135–145)
Sodium: 136 mEq/L (ref 135–145)

## 2011-01-09 LAB — CBC
HCT: 25.9 % — ABNORMAL LOW (ref 36.0–46.0)
HCT: 26.2 % — ABNORMAL LOW (ref 36.0–46.0)
HCT: 29 % — ABNORMAL LOW (ref 36.0–46.0)
HCT: 34.7 % — ABNORMAL LOW (ref 36.0–46.0)
Hemoglobin: 11.6 g/dL — ABNORMAL LOW (ref 12.0–15.0)
Hemoglobin: 8.8 g/dL — ABNORMAL LOW (ref 12.0–15.0)
Hemoglobin: 8.9 g/dL — ABNORMAL LOW (ref 12.0–15.0)
Hemoglobin: 9.9 g/dL — ABNORMAL LOW (ref 12.0–15.0)
MCHC: 33.5 g/dL (ref 30.0–36.0)
MCHC: 33.9 g/dL (ref 30.0–36.0)
MCHC: 34.1 g/dL (ref 30.0–36.0)
MCHC: 34.2 g/dL (ref 30.0–36.0)
MCV: 80.6 fL (ref 78.0–100.0)
MCV: 81.2 fL (ref 78.0–100.0)
MCV: 82.2 fL (ref 78.0–100.0)
MCV: 82.2 fL (ref 78.0–100.0)
Platelets: 154 10*3/uL (ref 150–400)
Platelets: 156 10*3/uL (ref 150–400)
Platelets: 162 10*3/uL (ref 150–400)
Platelets: 213 10*3/uL (ref 150–400)
RBC: 3.19 MIL/uL — ABNORMAL LOW (ref 3.87–5.11)
RBC: 3.22 MIL/uL — ABNORMAL LOW (ref 3.87–5.11)
RBC: 3.52 MIL/uL — ABNORMAL LOW (ref 3.87–5.11)
RBC: 4.28 MIL/uL (ref 3.87–5.11)
RDW: 14.6 % (ref 11.5–15.5)
RDW: 14.7 % (ref 11.5–15.5)
RDW: 15.1 % (ref 11.5–15.5)
RDW: 15.4 % (ref 11.5–15.5)
WBC: 11 10*3/uL — ABNORMAL HIGH (ref 4.0–10.5)
WBC: 6.9 10*3/uL (ref 4.0–10.5)
WBC: 7.8 10*3/uL (ref 4.0–10.5)
WBC: 8.5 10*3/uL (ref 4.0–10.5)

## 2011-01-09 LAB — GLUCOSE, CAPILLARY
Glucose-Capillary: 102 mg/dL — ABNORMAL HIGH (ref 70–99)
Glucose-Capillary: 103 mg/dL — ABNORMAL HIGH (ref 70–99)
Glucose-Capillary: 107 mg/dL — ABNORMAL HIGH (ref 70–99)
Glucose-Capillary: 112 mg/dL — ABNORMAL HIGH (ref 70–99)
Glucose-Capillary: 115 mg/dL — ABNORMAL HIGH (ref 70–99)
Glucose-Capillary: 116 mg/dL — ABNORMAL HIGH (ref 70–99)
Glucose-Capillary: 121 mg/dL — ABNORMAL HIGH (ref 70–99)
Glucose-Capillary: 131 mg/dL — ABNORMAL HIGH (ref 70–99)
Glucose-Capillary: 135 mg/dL — ABNORMAL HIGH (ref 70–99)
Glucose-Capillary: 137 mg/dL — ABNORMAL HIGH (ref 70–99)
Glucose-Capillary: 142 mg/dL — ABNORMAL HIGH (ref 70–99)
Glucose-Capillary: 142 mg/dL — ABNORMAL HIGH (ref 70–99)
Glucose-Capillary: 155 mg/dL — ABNORMAL HIGH (ref 70–99)
Glucose-Capillary: 157 mg/dL — ABNORMAL HIGH (ref 70–99)
Glucose-Capillary: 165 mg/dL — ABNORMAL HIGH (ref 70–99)
Glucose-Capillary: 178 mg/dL — ABNORMAL HIGH (ref 70–99)
Glucose-Capillary: 74 mg/dL (ref 70–99)
Glucose-Capillary: 85 mg/dL (ref 70–99)
Glucose-Capillary: 89 mg/dL (ref 70–99)
Glucose-Capillary: 92 mg/dL (ref 70–99)

## 2011-01-09 LAB — PROTIME-INR
INR: 0.97 (ref 0.00–1.49)
INR: 1.25 (ref 0.00–1.49)
INR: 1.29 (ref 0.00–1.49)
INR: 1.37 (ref 0.00–1.49)
INR: 1.57 — ABNORMAL HIGH (ref 0.00–1.49)
Prothrombin Time: 12.8 seconds (ref 11.6–15.2)
Prothrombin Time: 15.6 seconds — ABNORMAL HIGH (ref 11.6–15.2)
Prothrombin Time: 16 seconds — ABNORMAL HIGH (ref 11.6–15.2)
Prothrombin Time: 16.8 seconds — ABNORMAL HIGH (ref 11.6–15.2)
Prothrombin Time: 18.6 seconds — ABNORMAL HIGH (ref 11.6–15.2)

## 2011-01-09 LAB — URINE CULTURE
Colony Count: NO GROWTH
Culture: NO GROWTH

## 2011-01-09 LAB — COMPREHENSIVE METABOLIC PANEL
ALT: 14 U/L (ref 0–35)
AST: 22 U/L (ref 0–37)
Albumin: 4.5 g/dL (ref 3.5–5.2)
Alkaline Phosphatase: 56 U/L (ref 39–117)
BUN: 33 mg/dL — ABNORMAL HIGH (ref 6–23)
CO2: 28 mEq/L (ref 19–32)
Calcium: 9.6 mg/dL (ref 8.4–10.5)
Chloride: 103 mEq/L (ref 96–112)
Creatinine, Ser: 1.3 mg/dL — ABNORMAL HIGH (ref 0.4–1.2)
GFR calc Af Amer: 48 mL/min — ABNORMAL LOW (ref >60)
GFR calc non Af Amer: 40 mL/min — ABNORMAL LOW (ref >60)
Glucose, Bld: 91 mg/dL (ref 70–99)
Potassium: 4.1 mEq/L (ref 3.5–5.1)
Sodium: 139 mEq/L (ref 135–145)
Total Bilirubin: 0.5 mg/dL (ref 0.3–1.2)
Total Protein: 7.2 g/dL (ref 6.0–8.3)

## 2011-01-09 LAB — DIFFERENTIAL
Basophils Absolute: 0 10*3/uL (ref 0.0–0.1)
Basophils Relative: 0 % (ref 0–1)
Eosinophils Absolute: 0.2 10*3/uL (ref 0.0–0.7)
Eosinophils Relative: 3 % (ref 0–5)
Lymphocytes Relative: 26 % (ref 12–46)
Lymphs Abs: 1.8 10*3/uL (ref 0.7–4.0)
Monocytes Absolute: 0.4 10*3/uL (ref 0.1–1.0)
Monocytes Relative: 6 % (ref 3–12)
Neutro Abs: 4.5 10*3/uL (ref 1.7–7.7)
Neutrophils Relative %: 65 % (ref 43–77)

## 2011-01-09 LAB — TYPE AND SCREEN
ABO/RH(D): A POS
Antibody Screen: NEGATIVE

## 2011-01-09 LAB — ABO/RH: ABO/RH(D): A POS

## 2011-01-09 LAB — URINALYSIS, ROUTINE W REFLEX MICROSCOPIC
Bilirubin Urine: NEGATIVE
Glucose, UA: NEGATIVE mg/dL
Hgb urine dipstick: NEGATIVE
Ketones, ur: NEGATIVE mg/dL
Nitrite: NEGATIVE
Protein, ur: NEGATIVE mg/dL
Specific Gravity, Urine: 1.019 (ref 1.005–1.030)
Urobilinogen, UA: 1 mg/dL (ref 0.0–1.0)
pH: 5.5 (ref 5.0–8.0)

## 2011-01-09 LAB — APTT: aPTT: 25 seconds (ref 24–37)

## 2011-01-09 LAB — URINE MICROSCOPIC-ADD ON

## 2011-01-09 LAB — HEMOGLOBIN A1C
Hgb A1c MFr Bld: 6 % (ref 4.6–6.1)
Mean Plasma Glucose: 126 mg/dL

## 2011-01-09 LAB — PREPARE RBC (CROSSMATCH)

## 2011-01-12 NOTE — Letter (Signed)
Summary: Remote Device Check  [facility], [place]  [id number] [place]   [place], [place] [address]   Phone: [phone]  Fax: [phone]     January 02, 2011 MRN: [id number]   [place], [place]  [address]   Dear Ms. Whittle,   Your remote transmission was recieved and reviewed by your physician.  All diagnostics were within normal limits for you.  __X___Your next transmission is scheduled for:  03-23-11.  Please transmit at any time this day.  If you have a wireless device your transmission will be sent automatically.   Sincerely,  Shelly Bombard

## 2011-01-12 NOTE — Cardiovascular Report (Signed)
Summary: Office Visit   Office Visit   Imported By: Sallee Provencal 01/03/2011 14:03:17  _____________________________________________________________________  External Attachment:    Type:   Image     Comment:   External Document

## 2011-01-24 LAB — CARDIAC PANEL(CRET KIN+CKTOT+MB+TROPI)
CK, MB: 2.4 ng/mL (ref 0.3–4.0)
CK, MB: 3.6 ng/mL (ref 0.3–4.0)
CK, MB: 3.6 ng/mL (ref 0.3–4.0)
Relative Index: 1 (ref 0.0–2.5)
Relative Index: 1.2 (ref 0.0–2.5)
Relative Index: 1.3 (ref 0.0–2.5)
Total CK: 252 U/L — ABNORMAL HIGH (ref 7–177)
Total CK: 272 U/L — ABNORMAL HIGH (ref 7–177)
Total CK: 304 U/L — ABNORMAL HIGH (ref 7–177)
Troponin I: 0.01 ng/mL (ref 0.00–0.06)
Troponin I: 0.01 ng/mL (ref 0.00–0.06)
Troponin I: 0.02 ng/mL (ref 0.00–0.06)

## 2011-01-24 LAB — GLUCOSE, CAPILLARY
Glucose-Capillary: 121 mg/dL — ABNORMAL HIGH (ref 70–99)
Glucose-Capillary: 143 mg/dL — ABNORMAL HIGH (ref 70–99)
Glucose-Capillary: 149 mg/dL — ABNORMAL HIGH (ref 70–99)
Glucose-Capillary: 190 mg/dL — ABNORMAL HIGH (ref 70–99)
Glucose-Capillary: 88 mg/dL (ref 70–99)
Glucose-Capillary: 94 mg/dL (ref 70–99)
Glucose-Capillary: 97 mg/dL (ref 70–99)

## 2011-01-24 LAB — URINALYSIS, ROUTINE W REFLEX MICROSCOPIC
Bilirubin Urine: NEGATIVE
Glucose, UA: NEGATIVE mg/dL
Hgb urine dipstick: NEGATIVE
Ketones, ur: 15 mg/dL — AB
Nitrite: NEGATIVE
Protein, ur: NEGATIVE mg/dL
Specific Gravity, Urine: 1.027 (ref 1.005–1.030)
Urobilinogen, UA: 1 mg/dL (ref 0.0–1.0)
pH: 5.5 (ref 5.0–8.0)

## 2011-01-24 LAB — COMPREHENSIVE METABOLIC PANEL
ALT: 18 U/L (ref 0–35)
AST: 27 U/L (ref 0–37)
Albumin: 4.1 g/dL (ref 3.5–5.2)
Alkaline Phosphatase: 55 U/L (ref 39–117)
BUN: 26 mg/dL — ABNORMAL HIGH (ref 6–23)
CO2: 31 mEq/L (ref 19–32)
Calcium: 9.7 mg/dL (ref 8.4–10.5)
Chloride: 105 mEq/L (ref 96–112)
Creatinine, Ser: 1.31 mg/dL — ABNORMAL HIGH (ref 0.4–1.2)
GFR calc Af Amer: 48 mL/min — ABNORMAL LOW (ref >60)
GFR calc non Af Amer: 39 mL/min — ABNORMAL LOW (ref >60)
Glucose, Bld: 143 mg/dL — ABNORMAL HIGH (ref 70–99)
Potassium: 4.6 mEq/L (ref 3.5–5.1)
Sodium: 141 mEq/L (ref 135–145)
Total Bilirubin: 0.5 mg/dL (ref 0.3–1.2)
Total Protein: 6.5 g/dL (ref 6.0–8.3)

## 2011-01-24 LAB — DIFFERENTIAL
Basophils Absolute: 0 10*3/uL (ref 0.0–0.1)
Basophils Relative: 0 % (ref 0–1)
Eosinophils Absolute: 0.2 10*3/uL (ref 0.0–0.7)
Eosinophils Relative: 3 % (ref 0–5)
Lymphocytes Relative: 24 % (ref 12–46)
Lymphs Abs: 1.6 10*3/uL (ref 0.7–4.0)
Monocytes Absolute: 0.4 10*3/uL (ref 0.1–1.0)
Monocytes Relative: 6 % (ref 3–12)
Neutro Abs: 4.3 10*3/uL (ref 1.7–7.7)
Neutrophils Relative %: 67 % (ref 43–77)

## 2011-01-24 LAB — PROTIME-INR
INR: 0.9 (ref 0.00–1.49)
Prothrombin Time: 12.5 seconds (ref 11.6–15.2)

## 2011-01-24 LAB — URINE MICROSCOPIC-ADD ON

## 2011-01-24 LAB — CBC
HCT: 34 % — ABNORMAL LOW (ref 36.0–46.0)
Hemoglobin: 11.7 g/dL — ABNORMAL LOW (ref 12.0–15.0)
MCHC: 34.4 g/dL (ref 30.0–36.0)
MCV: 81.9 fL (ref 78.0–100.0)
Platelets: 199 10*3/uL (ref 150–400)
RBC: 4.16 MIL/uL (ref 3.87–5.11)
RDW: 15.6 % — ABNORMAL HIGH (ref 11.5–15.5)
WBC: 6.4 10*3/uL (ref 4.0–10.5)

## 2011-01-24 LAB — APTT: aPTT: 24 seconds (ref 24–37)

## 2011-01-25 LAB — BASIC METABOLIC PANEL
BUN: 30 mg/dL — ABNORMAL HIGH (ref 6–23)
CO2: 22 mEq/L (ref 19–32)
Calcium: 8.7 mg/dL (ref 8.4–10.5)
Chloride: 104 mEq/L (ref 96–112)
Creatinine, Ser: 1.32 mg/dL — ABNORMAL HIGH (ref 0.4–1.2)
GFR calc Af Amer: 47 mL/min — ABNORMAL LOW (ref >60)
GFR calc non Af Amer: 39 mL/min — ABNORMAL LOW (ref >60)
Glucose, Bld: 187 mg/dL — ABNORMAL HIGH (ref 70–99)
Potassium: 4.3 mEq/L (ref 3.5–5.1)
Sodium: 135 mEq/L (ref 135–145)

## 2011-01-25 LAB — MAGNESIUM: Magnesium: 2.3 mg/dL (ref 1.5–2.5)

## 2011-01-25 LAB — GLUCOSE, CAPILLARY
Glucose-Capillary: 100 mg/dL — ABNORMAL HIGH (ref 70–99)
Glucose-Capillary: 114 mg/dL — ABNORMAL HIGH (ref 70–99)
Glucose-Capillary: 87 mg/dL (ref 70–99)
Glucose-Capillary: 87 mg/dL (ref 70–99)
Glucose-Capillary: 93 mg/dL (ref 70–99)
Glucose-Capillary: 95 mg/dL (ref 70–99)

## 2011-02-28 NOTE — Consult Note (Signed)
NAMELORIE, POSSEHL NO.:  [id number]   MEDICAL RECORD NO.:  [id number]          PATIENT TYPE:  INP   LOCATION:  3312                         FACILITY:  [facility]   PHYSICIAN:  Loralie Champagne, MD      DATE OF BIRTH:  Mar 02, 1930   DATE OF CONSULTATION:  03/02/2009  DATE OF DISCHARGE:                                 CONSULTATION   PRIMARY CARE PHYSICIAN:  Jeanann Lewandowsky, MD   PRIMARY CARDIOLOGIST:  Minus Breeding, MD, [facility]   ELECTROPHYSIOLOGIST:  Thompson Grayer, MD   CHIEF COMPLAINT:  Chest pain.   HISTORY OF PRESENT ILLNESS:  Ms. Haering is a 77 year old female with no  previous history of coronary artery disease.  Today, she had back  surgery and was in the prone position for several hours.  After the  surgery, she developed chest pain, she describes as a heaviness.  She  states it reaches a 6/10.  She also complains of back pain.  She  complains of shortness of breath with this pain and nausea.  She had  Dilaudid for the pain earlier and after that, her nausea got worse.  She  feels that taking a deep breath makes the pain better.  She also states  that she has chest pain at times when she is exerting herself such as  walking up a flight of stairs.  Currently, although she still complains  of pain, she does not appear acutely uncomfortable.   PAST MEDICAL HISTORY:  1. Status post Myoview in 2007 showing mild distal anterior      reversibility with an EF of 62%, low risk study.  2. History of symptomatic bradycardia status post St. Jude permanent      pacemaker.  3. Hypertension.  4. Obesity.  5. Diabetes.  6. Degenerative joint disease.  7. Obstructive sleep apnea on CPAP.  8. Restless legs syndrome.  9. History of right carotid bruit.  10.Allergic rhinitis.  11.History of tuberculosis exposure in 1950.  12.Obesity.   PAST SURGICAL HISTORY:  She is status post St. Jude permanent pacemaker  on February 04, 2009.  She has had her nose cauterized.  She has had  back  surgery, hysterectomy, appendectomy, and breast lumpectomy.   ALLERGIES:  She is allergic or intolerant to PENICILLIN, VICODIN,  CARDIZEM, MORPHINE, and ASPIRIN.   SOCIAL HISTORY:  She lives in [place] with her husband.  She is a  retired Marine scientist.  She has no history of alcohol, tobacco, or drug abuse.  She is not very active because of her orthopedic problems.   FAMILY HISTORY:  Her mother died at age 38 with a history of heart  disease and her father died at age 41 with no history of coronary artery  disease.  She has 1 one sister who also does not have coronary artery  disease.   REVIEW OF SYSTEMS:  She has significant back pain.  She had  palpitations, which have been greatly improved by the pacemaker.  She  had some problems with rapid AV pacing after the procedure but it was  felt secondary to oversensing and this  has improved.  She denies reflux  symptoms or melena.  She has urinary frequency because of her diuretic,  but denies dysuria.  Full 14-point review of systems is otherwise  negative.   PHYSICAL EXAMINATION:  GENERAL:  She is a well-developed, well-  nourished, African American female who was sleepy, but arouses easily to  verbal stimulus.  HEENT:  Normal except for her lips and part of her  mouth appears dry.  NECK:  There is no JVD.  There is a soft right carotid bruit, but no  thyromegaly.  CHEST:  Essentially clear to auscultation bilaterally.  CV:  Her heart is regular in rate and rhythm with a soft systolic murmur  noted at the sternal border.  ABDOMEN:  Soft and nontender with active bowel sounds.  No  hepatosplenomegaly is noted.  SKIN:  No rashes or lesions are noted.  MUSCULOSKELETAL:  There is no joint deformity or effusions.  NEURO:  She is sleepy secondary to the Dilaudid she had previously but  she arouses easily to verbal stimulus and answers questions  appropriately.  She is able to move all extremities with no focal  deficits noted.    LABORATORY VALUES:  Hemoglobin 11.7, hematocrit 34, WBC 6.4, platelets  199.  Sodium 141, potassium 4.6, chloride 105, CO2 of 31, BUN 26,  creatinine 1.31, GFR of 48, INR 0.9.   IMPRESSION:  Ms. Corrales was seen today by Dr. Aundra Dubin.  She is a 75-year-  old female with a history of a negative Myoview in 2007, who also has  hypertension, diabetes, and is status post pacemaker placement for  symptomatic bradycardia.  She began having chest heaviness after a  laminectomy.  She was laid prone during the surgery.  Her chest  heaviness is improved with deep inspiration.  It is probably  musculoskeletal, but we will assess for any cardiac involvement.  We  will cycle cardiac enzymes and an aspirin 81 mg daily if her allergies  does not include a rash or shortness of breath.  Her pain will be  treated symptomatically.  If her pain resolves and her enzymes are  negative, no further treatment is indicated at this time.  If her pain  continues and enzymes are negative, we will check an echocardiogram.  If  her enzymes are positive, she will be catheterized as soon as Dr. Carloyn Manner  states it is safe to anticoagulate her.  At this time, only baby aspirin  is appropriate.      Rosaria Ferries, PA-C      Loralie Champagne, MD  Electronically Signed    RB/MEDQ  D:  03/02/2009  T:  03/03/2009  Job:  [id number]

## 2011-02-28 NOTE — Assessment & Plan Note (Signed)
[place] [place] OFFICE NOTE   [name], West                     MRN:          [id number]  DATE:06/30/2008                            DOB:          26-Oct-1929    PRIMARY CARE PHYSICIAN:  Jeanann Lewandowsky, M.D.   REASON FOR PRESENTATION:  Evaluate patient with hypertension.  She has  preoperative shoulder surgery.   HISTORY OF PRESENT ILLNESS:  The patient is a pleasant 77 year old  female with hypertension that has been difficult to control.  This  actually had been under better control over the last several months.  She states it is typically in the 120s/60s.  She came today for routine  followup.  She is also due to have shoulder surgery and needs Cardiology  clearance for Dr. Durward West.   The patient had a stress perfusion study in 2007, demonstrating an EF of  62%.  there was a mild distal anterior reversibility that may had been  ischemic, but we thought it was quite low risk.  She has had no symptom  since that time.  She gets in the water with [employer] once a week and does  aerobics.  She does yard work.  With this level of activity (greater  than 5 METS), she does not have any chest pressure, neck or arm  discomfort.  She has had no palpitations, presyncope, or syncope.  She  does get dyspneic when she does moderate activities, but not at rest.  She is not having any PND or orthopnea.  This level of the dyspnea is  very stable.  She actually has no dyspnea when she is in the pool  exercising.   PAST MEDICAL HISTORY:  1. Hypertension.  2. Sleep apnea (she is not really compliant with her CPAP).  3. Degenerative joint disease.  4. Type 2 diabetes mellitus.  5. Tonsillectomy.  6. Hysterectomy.  7. Right breast lumpectomy.  8. Back surgery x2.  9. Right knee replacement.  10.E. coli infections.   ALLERGIES:  Intolerance to PENICILLIN, VICODIN, CARDIZEM, and MORPHINE.   MEDICATIONS:  1.  Spironolactone 25 mg every other day.  2. Diovan HCT 160/25 daily.  3. Multivitamin.  4. Celebrex.  5. Norvasc 10 mg daily.  6. Tekturna 300 mg daily.  7. Glimepiride 5 mg daily.   REVIEW OF SYSTEMS:  As stated in the HPI and otherwise negative for  other systems.   PHYSICAL EXAMINATION:  GENERAL:  The patient is in no distress.  VITAL SIGNS:  Blood pressure 126/68, heart rate 66 and regular, weight  203 pounds, and body mass index 34.  HEENT:  Eyelids unremarkable.  Pupils are equal, round, and reactive to  light.  Fundi are visualized.  Oral mucosa unremarkable.  NECK:  No jugular venous distension at 45 degrees.  Carotid upstroke  brisk and symmetrical.  No bruits, no thyromegaly.  LYMPHATICS:  No cervical, axillary, or inguinal adenopathy.  LUNGS:  Clear to auscultation bilaterally.  BACK:  No costovertebral angle tenderness.  CHEST:  Unremarkable.  HEART:  PMI not displaced or sustained.  S1 and S2  are within normal  limits.  No S3 or no S4.  No clicks, no rubs, no murmurs.  ABDOMEN:  Obese, positive bowel sounds, normal in frequency and pitch.  No bruits, no rebound, no guarding.  No midline pulsatile mass.  No  hepatomegaly, no splenomegaly.  SKIN:  No rashes, no nodules.  EXTREMITIES:  2+ pulses throughout.  No edema, no cyanosis, no clubbing.  NEURO:  Oriented to person, place, and time.  Cranial nerves II-XII  grossly intact.  Motor grossly intact.   EKG, sinus bradycardia, rate 56, axis within normal limits, intervals  within normal limits, no acute ST-T wave changes.   ASSESSMENT AND PLAN:  1. Hypertension.  Blood pressure is well controlled.  She is on a good      medical regimen.  No changes will be made.  No further      cardiovascular testing is suggested.  2. Preoperative clearance.  The patient has stress perfusion study 2      years ago.  She has no high risk clinical features ongoing.  She      has no high risk clinical findings.  This is a moderate risk       procedure.  She has a functional level that is greater than 5 METS.      Therefore, according to ACC/AHA Guidelines, the patient has had      acceptable risk for the planned surgery.  She should continue the      METS as listed with careful attention paid to blood pressure      control.  3. Obesity.  Hopefully, she will be more successful at losing weight      going forward.  We discussed this at length.  4. Palpitations.  The patient has some strong heart beats sometimes      when she gets up and goes to the bathroom for instances at night.      This is not rapid heart rate.  It is not symptomatic.  No further      therapy is warranted.  Currently, she has sinus bradycardia, but      she is asymptomatic with this.  5. Diabetes per Dr. Jeanann Lewandowsky.  6. Followup.  I will see her back in 1 year or sooner if necessary.     Minus Breeding, MD, [facility]  Electronically Signed    JH/MedQ  DD: 06/30/2008  DT: 07/01/2008  Job #: [id number]   cc:   [name] Kotyk. Durward West, M.D.  Jeanann Lewandowsky, M.D.

## 2011-02-28 NOTE — Assessment & Plan Note (Signed)
[place] OFFICE NOTE   TAZHANE, DIETRICH                     MRN:          [id number]  DATE:03/27/2007                            DOB:          [birth year]    PRIMARY CARE PHYSICIAN:  Jeanann Lewandowsky, M.D.   REASON FOR PRESENTATION:  Evaluate patient with hypertension and  bradycardia.   HISTORY OF PRESENT ILLNESS:  Patient is 77 years old.  She returns for  followup of the above.  She has done better since I last saw her.  Her  blood pressure has been better controlled, according to readings she has  had at both Dr. Ainsley Spinner office and with Dr. Annamaria Boots.  She is wearing her C-  PAP.  She has had no problems with the medications as listed.  She has  gained a few pounds.  She is not exercising because her feet go to sleep  when she walks.  She is seeing a neurosurgeon and is due to have an MRI  for evaluation of this.  She has had some palpitations, but they are  improved.  They happen when she rushes.  She denies any presyncope or  syncope.  She has had no chest discomfort, neck or arm discomfort.  She  has some mild dyspnea with moderate exertion, but this is baseline.  She  has not had any PND or orthopnea.   PAST MEDICAL HISTORY:  Hypertension, sleep apnea, degenerative joint  disease, type 2 diabetes mellitus, tonsillectomy, hysterectomy, right-  breast lumpectomy, back surgery times two, right knee replacement, e.  coli infections.   ALLERGIES:  PENICILLIN, VICODIN, CARDIZEM and MORPHINE.   MEDICATIONS:  1. Spironolactone 25 mg every other day.  2. Diovan 160/25 daily.  3. Multivitamin.  4. Omega Joint.  5. Celebrex 200 mg daily.  6. Antioxygen.  7. Norvasc 10 mg daily.  8. Nexium 40 mg daily.  9. Tekturna 300 mg daily.   REVIEW OF SYSTEMS:  As stated in the HPI, and otherwise negative for  other systems.   PHYSICAL EXAMINATION:  The patient is in no distress.  Blood pressure  134/70, heart  rate 64 and regular.  Weight 208 pounds.  Body mass index  34.  NECK:  No jugular venous distention at 45 degrees.  Carotid upstroke  brisk and symmetric.  Bilateral soft bruits, no thyromegaly.  LYMPHATICS:  No adenopathy.  LUNGS:  Clear to auscultation bilaterally.  HEART:  PMI not displaced or sustained.  S1 and S2 within normal limits.  No S3, no S4.  No clicks, no rubs, no murmurs.  ABDOMEN:  Obese, positive bowel sounds, normal in frequency and pitch.  No bruits, no rebound, guarding, no midline pulsatile mass, no  organomegaly.  SKIN:  No rashes, no nodules.  EXTREMITIES:  Two-plus pulses, no edema.   ASSESSMENT AND PLAN:  1. Hypertension:  Blood pressure is well-controlled.  She will      continue the medications as listed.  We have discussed the risk of      hyperkalemia.  I have asked her to get her BMET checked  again when      she sees Dr. Carlis Abbott in August.  She knows she needs to have this      three times a year or so, particularly on the combination of meds.  2. Obesity:  We discussed losing weight with diet and exercise.  I      gave her some specific suggestions on both.  3. Palpitations:  These are not particularly problematic at this      point.  She will let me know if they get worse, rather than better.  4. Peripheral vascular disease:  She is due to have carotid Dopplers      in April.  5. Followup:  I will see her at the time of her carotid Dopplers.     Minus Breeding, MD, [facility]  Electronically Signed    JH/MedQ  DD: 03/27/2007  DT: 03/27/2007  Job #: [id number]   cc:   Jeanann Lewandowsky, M.D.

## 2011-02-28 NOTE — H&P (Signed)
[name], West              ACCOUNT NO.:  [id number]   MEDICAL RECORD NO.:  [id number]          PATIENT TYPE:  INP   LOCATION:  3312                         FACILITY:  [facility]   PHYSICIAN:  Elizabeth Sauer, M.D.      DATE OF BIRTH:  Jun 29, 1930   DATE OF ADMISSION:  03/02/2009  DATE OF DISCHARGE:                              HISTORY & PHYSICAL   ADMITTING DIAGNOSIS:  Spondylosis and spinal stenosis at L2-3 and L3-4.   BODY OF TEXT:  This is a very nice now 77 year old right-handed black  female who I have been following for 75 years.  She has fusion at 4-5.  She is developing worsening dysesthesias in her legs and leg cramping.  MR and CT have shown spinal stenosis and she is now admitted for  decompression at 2-3 and 3-4.   MEDICAL HISTORY:  Remarkable for benign breast cancer, hysterectomy, and  carpal tunnel.  She has hypertension.  She also has coronary artery  disease.   She is allergic to PENICILLIN and CARDIZEM.   SOCIAL HISTORY:  She does not smoke.  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  Husband is retired English as a second language teacher.   FAMILY HISTORY:  Noncontributory for this problem.   REVIEW OF SYSTEMS:  Remarkable for bladder dysfunction.   HEENT:  Normal limits.  NECK:  Supple.  No lymphadenopathy.  CHEST:  Clear.  CARDIAC:  Regular rate and rhythm.  ABDOMEN:  Nontender with no hepatosplenomegaly.  EXTREMITIES:  Without clubbing or cyanosis.  GU:  Deferred.  Peripheral pulse are good.  NEUROLOGIC:  She is awake, alert and oriented.  Cranial nerves are  intact.  Motor exam shows 5/5 strength throughout the upper and lower  extremities until she gets up and walks about and then her strength  deteriorates to about 4/5 especially in proximal legs.   She is able to flex the lower extremities.   Studies have shown severe stenosis at 2-3 and 3-4.   CLINICAL IMPRESSION:  Neurogenic claudication secondary to spinal  stenosis.   PLAN:  Decompressive laminotomy,  foraminotomy.  The risks and benefits  have been discussed with her and she wish to proceed.           ______________________________  Elizabeth Sauer, M.D.     MWR/MEDQ  D:  03/02/2009  T:  03/03/2009  Job:  [id number]

## 2011-02-28 NOTE — Discharge Summary (Signed)
[name], [name]              ACCOUNT NO.:  [id number]   MEDICAL RECORD NO.:  [id number]          PATIENT TYPE:  INP   LOCATION:  [id number]                         FACILITY:  [facility]   PHYSICIAN:  Elizabeth Sauer, M.D.      DATE OF BIRTH:  May 16, 1930   DATE OF ADMISSION:  03/02/2009  DATE OF DISCHARGE:  03/05/2009                               DISCHARGE SUMMARY   ADMITTING DIAGNOSIS:  Spinal stenosis L2-3, L3-4.   DISCHARGE DIAGNOSIS:  Spinal stenosis L2-3, L3-4.   PROCEDURE:  L2-3, L3-4 decompressive laminectomy.   SURGEON:  Elizabeth Sauer, MD, Neurosurgery.   COMPLICATIONS:  None.   __________.   BODY OF TEXT:  A 77 year old right-handed black lady with history and  physical recounted in the chart.  She has had increasing lower extremity  dysfunction and spinal stenosis on her MR.  She was admitted for  decompression.  She has coronary artery disease and was seen by  Cardiology prior to admission.  She was admitted after ascertaining  normal laboratory values and underwent a 2-3, 3-4 decompression and did  well.  Postoperatively, she had some complaints of chest pain, was  visited by the cardiologists.  Enzymes are negative.  She spent 2 days  and monitored in the ICU.  Chest pain went away.  No ill effects were  noted.  EKG was normal.  She was transferred out to the floor with  intact strength.  Physical therapy visited her and found she was doing  well, getting up and about.  She has little bit of numbness in her left  leg, but legs feel much better and her strength is full.  She is being  discharged home to the care of her family.  Her followup would be in the  [facility] in a week for sutures.           ______________________________  Elizabeth Sauer, M.D.     MWR/MEDQ  D:  03/05/2009  T:  03/06/2009  Job:  [id number]

## 2011-02-28 NOTE — Consult Note (Signed)
[name], West              ACCOUNT NO.:  [id number]   MEDICAL RECORD NO.:  [id number]          PATIENT TYPE:  OIB   LOCATION:  2899                         FACILITY:  [facility]   PHYSICIAN:  Thompson Grayer, MD       DATE OF BIRTH:  Aug 17, 1930   DATE OF CONSULTATION:  02/04/2009  DATE OF DISCHARGE:                                 CONSULTATION   REFERRING PHYSICIAN:  Minus Breeding, MD, [facility]   REASON FOR CONSULTATION:  Bradycardia.   HISTORY OF PRESENT ILLNESS:  [name] is a pleasant 77 year old female  with a history of hypertension and diabetes, who presents today for EP  consultation regarding symptomatic bradycardia.  The patient reports  longstanding palpitations.  These typically are worse at night.  They  recently increased in frequency and are now present during the day.  She  describes these as slow and forceful heartbeats.  She reports  associated dizziness and fatigue.  She recently had a Holter monitor  placed which revealed sinus bradycardia and junctional rhythms at 40  beats per minute, which corresponded to her episodes.  She denies chest  pain, shortness of breath, presyncope, or syncope.  She has no other  complaints today.   PAST MEDICAL HISTORY:  1. Hypertension.  2. Obesity.  3. Diabetes mellitus.  4. Obstructive sleep apnea, on CPAP.  5. DJD.  6. DDD.  7. Allergic rhinitis.   PAST SURGICAL HISTORY:  1. Prior back surgery.  2. Hysterectomy.  3. Appendectomy.  4. Breast biopsy.   ALLERGIES:  PENICILLIN, VICODIN, MORPHINE, ASPIRIN, and CARDIZEM.   HOME MEDICATIONS:  1. Spironolactone 25 mg daily.  2. Diovan HCT 160/25 mg daily.  3. Multivitamin daily.  4. Celebrex 200 mg daily.  5. Norvasc 10 mg daily.  6. Tekturna 300 mg daily.  7. Glimepiride 1 mg daily.  8. Zyrtec 10 mg daily p.r.n.  9. Skelaxin 100 mg q.8 h. p.r.n.  10.Flonase p.r.n.  11.Valium 5 mg p.r.n.  12.CPAP.  13.Demadex 10 mg 3 times a week.  14.Nexium 40 mg p.r.n.   SOCIAL  HISTORY:  The patient lives in [place].  She is a retired  Marine scientist.  She denies tobacco, alcohol, or drug use.   FAMILY HISTORY:  Notable for cancer and hypertension.   REVIEW OF SYSTEMS:  All systems were reviewed and negative except as  outlined in the HPI above.   PHYSICAL EXAMINATION:  VITAL SIGNS:  Blood pressure 175/67, heart rate  50, respirations 18, and sats 97% on room air, afebrile.  GENERAL:  The patient is an obese, well-appearing female in no acute  distress.  She is alert and oriented x3.  HEENT:  Normocephalic and atraumatic.  Sclerae clear.  Conjunctivae  pink.  Oropharynx clear.  NECK:  Supple.  No thyromegaly or JVD.  She does have a left bruit.  LUNGS:  Clear to auscultation bilaterally.  HEART:  Bradycardic, regular rhythm.  No murmurs, rubs, or gallops.  GI:  Soft, nontender, nondistended.  Positive bowel sounds.  EXTREMITIES:  No clubbing, cyanosis, or edema.  NEUROLOGIC:  Cranial nerves II through XII  are intact.  Strength and  sensation are intact.  SKIN:  No ecchymoses or lacerations.  MUSCULOSKELETAL:  No deformity or atrophy.  PSYCH:  Euthymic mood and full affect.   EKG from January 27, 2009, reveals sinus bradycardia at 47 beats per  minute and is otherwise unremarkable.   LABORATORY DATA:  Sodium 141, potassium 4.1, creatinine 1.3, white blood  cell count 8.2, hematocrit 34, and platelets 186.  INR 1.  TSH 1.36.   IMPRESSION:  [name] is a pleasant 77 year old female, who presents  today for further consultation regarding symptomatic bradycardia and  junctional escape rhythms.  She is on no medications that would  exacerbate bradycardia.  Her thyroid is normal.  I therefore did not see  any reversible causes for her symptomatic bradycardia.  Therapeutic  strategies for symptomatic bradycardia were discussed in detail with the  patient today.  She reports being highly symptomatic.  Risks, benefits,  and alternatives to dual-chamber pacemaker  implantation were discussed  in detail with the patient today.  These risks include but are not  limited to infection, bleeding, pneumothorax, pericardial effusion,  tamponade, perforation, lead dislodgement, renal failure, pneumothorax,  and  stroke.  The patient understands these risks and wishes to proceed.  We  will therefore proceed with a dual-chamber pacemaker at this time.   PLAN:  Dual-chamber pacemaker implantation.      Thompson Grayer, MD  Electronically Signed     JA/MEDQ  D:  02/04/2009  T:  02/04/2009  Job:  [id number]   cc:   Minus Breeding, MD, [facility]

## 2011-02-28 NOTE — Discharge Summary (Signed)
[name], [name]              ACCOUNT NO.:  [id number]   MEDICAL RECORD NO.:  [id number]          PATIENT TYPE:  INP   LOCATION:  [id number]                         FACILITY:  [facility]   PHYSICIAN:  Thompson Grayer, MD       DATE OF BIRTH:  1930-03-23   DATE OF ADMISSION:  02/04/2009  DATE OF DISCHARGE:  02/05/2009                               DISCHARGE SUMMARY   ADDENDUM:  This concerns palpitations which the patient feels,  especially when she is active.  Looking at the telemetry shows rather  sharp peaks several times this morning which come fairly abruptly and  end fairly abruptly.  The peaks and the palpitation coincide with a wide  complex tachycardia which looks as if it is AV pacing.  The [employer]  representative was asked to interrogate the device.  She felt that the  episodes were secondary to high sensitivity in the R region when the  patient is mobile.  This oversensing causes the pacemaker to react when  the patient exerts herself more than is required.  The sensitivity has  been turned down and we will see in an office visit with re-  interrogation and correlating with of course the patient's feelings  whether this is indeed the cause of the rapid AV pacing.   Another cause might be unlikely SVT, but that would probably be the  sensing of an atrial impulse instead of AV pacing.      Sueanne Margarita, [place]      Thompson Grayer, MD     GM/MEDQ  D:  02/05/2009  T:  02/05/2009  Job:  [id number]   cc:   Minus Breeding, MD, [facility]  Elizabeth Sauer, M.D.

## 2011-02-28 NOTE — Assessment & Plan Note (Signed)
[place] OFFICE NOTE   [name], [name]                     MRN:          [id number]  DATE:12/20/2007                            DOB:          1929/10/25    PRIMARY CARE PHYSICIAN:  Jeanann Lewandowsky, M.D.   REASON FOR PRESENTATION:  Hypertension.   HISTORY OF PRESENT ILLNESS:  The patient is a very pleasant 77 year old.  She presents for evaluation of the above.  Her blood pressure has been  much better controlled since starting medical regimen listed.  She has  had no problems with this.  She has had no chest pressure, neck or arm  discomfort.  She has had no palpitation, presyncope or syncope.  She is  wearing her CPAP.   PAST MEDICAL HISTORY:  Hypertension, sleep apnea, degenerative joint  disease, type 2 diabetes mellitus, tonsillectomy, hysterectomy, right  breast lumpectomy, back surgery x2, right knee replacement, E. coli  infections.   ALLERGIES:  PENICILLIN, VICODIN, CARDIZEM, MORPHINE.   MEDICATIONS:  1. Spironolactone 25 mg every other day.  2. Diovan HCT 160/25 daily.  3. Multivitamin.  4. Celebrex 200 mg daily.  5. Norvasc 10 mg daily.  6. Tekturna 300 mg daily.  7. Glimepiride 5 mg daily.   REVIEW OF SYSTEMS:  As stated in the HPI, otherwise negative for other  systems.   PHYSICAL EXAMINATION:  GENERAL:  The patient is in no distress.  VITAL SIGNS:  Blood pressure 132/64, heart rate 57 and regular, weight  204 pounds, body mass index 34.  NECK:  No jugular venous distention at 45 degrees; carotid upstroke  brisk and symmetrical.  No bruits, no thyromegaly.  LYMPHATICS:  No cervical, axillary, inguinal adenopathy.  LUNGS:  Clear to auscultation bilaterally.  BACK:  No costovertebral tenderness.  CHEST:  Unremarkable.  HEART:  PMI not displaced or sustained, S1-S2 within normal limits, no  S3, no S4, no murmurs.  ABDOMEN:  Obese, positive bowel sounds, normal in frequency and  pitch.  No bruits, rebound, guarding.  No midline pulsatile mass or  organomegaly.  SKIN:  No rash.  EXTREMITIES:  2+ pulse, no edema.   ASSESSMENT AND PLAN:  1. Hypertension.  Blood pressure is well-controlled.  I am going to      check a BMET today.  She understands that this should be checked      about 3x a year on this combination of medicines to make sure she      does not get hyperkalemic or renal insufficiency.  She will      continue the medications as listed.  2. Carotid stenosis.  Preliminary Doppler today demonstrates 0-39%      bilateral carotid stenosis.  This can be followed up in 2 years.  3. Obesity.  We spent a long time discussing the need to lose weight      with diet and exercise.  She is going to avoid breads and potatoes.  4. Dyspnea.  I think this is multifactorial.  She gets a little short      of breath with ambulation.  She has had a negative stress perfusion      study so I do not think further cardiovascular testing is      suggested.  She needs weight loss and exercise.  5. Followup.  I will see her back in 6 months and probably as needed      thereafter.     Minus Breeding, MD, [facility]  Electronically Signed    JH/MedQ  DD: 12/20/2007  DT: 12/22/2007  Job #: [id number]   cc:   Jeanann Lewandowsky, M.D.

## 2011-02-28 NOTE — Discharge Summary (Signed)
[name], [name]              ACCOUNT NO.:  [id number]   MEDICAL RECORD NO.:  [id number]          PATIENT TYPE:  INP   LOCATION:  [id number]                         FACILITY:  [place] [facility]   PHYSICIAN:  Thompson Grayer, MD       DATE OF BIRTH:  [birth year]   DATE OF ADMISSION:  02/04/2009  DATE OF DISCHARGE:  02/05/2009                               DISCHARGE SUMMARY   This patient has allergies to PENICILLIN, VICODIN, CARDIZEM, MORPHINE,  and ASPIRIN.   Time for this dictation greater than 45 minutes.   FINAL DIAGNOSES:  1. Discharging day #1, status post implant of a St. Jude ACCENT RF DR      dual-chamber pacemaker.  2. The patient had symptomatic bradycardia with sinus node      dysfunction.  3. The patient has impending back surgery at the end of May 2010.      This will go forward if the incision looks good at the [facility]      which is scheduled for Feb 24, 2009, at 9:20   SECONDARY DIAGNOSES:  1. Hypertension.  2. Diabetes.  3. Degenerative joint disease.  4. History of breast lumpectomy.  5. Right total knee arthroplasties.  6. Back surgery x2, anticipating a third.  7. Status post hysterectomy.  8. Due for shoulder surgery in the future.  9. Obesity.  10.Obstructive sleep apnea.  The patient wears continuous positive      airway pressure.   PROCEDURE:  Implant of the St. Jude ACCENT RF DR dual-chamber pacemaker  by Dr. Rayann Heman for symptomatic bradycardia and sinus node dysfunction on  February 04, 2009.  The patient has had no post-procedural complications.  The device has been interrogated postprocedure day #1.  It is currently  set at DDDR, all values within normal limits.  Chest x-ray shows that  the leads are appropriate.  There is no hematoma at the pacer pocket.  The pain was controlled well by oral analgesia.  The patient will be  discharged on February 05, 2009.  She is asked to keep her incision dry for  next 7 days, to sponge bathe until Thursday, February 11, 2009.   DISCHARGE MEDICATIONS:  1. Spironolactone 25 mg every other day.  2. Diovan/HCT 160/25 one tab daily.  3. Multivitamin daily.  4. Celebrex 200 mg daily.  5. Norvasc 10 mg daily.  6. Tekturna 300 mg daily.  7. Glimepiride 1 mg 1-2 tablets daily.  8. Zyrtec 10 mg daily as needed.  9. Skelaxin 800 mg as needed.  10.Beano 1 tablet before dinner.  11.Flonase 50 mcg per spray as needed.  12.Valium 5 mg as needed.  13.Demadex 10 mg 3 times weekly.  14.Nexium 40 mg daily.  15.The patient will continue on her CPAP every evening.   She follows up at [facility], [address]; [facility] on Wednesday, Feb 24, 2009, at 9:20.  They will call Dr. Carloyn Manner at  [phone] to okay the patient for upcoming back surgery at the end of  May.  To see Dr. Rayann Heman on Friday, June 11, 2009,  at 9 o'clock.   LABORATORY DATA AND STUDIES PERTINENT TO THIS ADMISSION:  Drawn on January 28, 2009, sodium 141, potassium 4.1, chloride 105, carbonate 30, glucose  94, BUN is 45, and creatinine 1.3.  White cells 8.2, hemoglobin 11.8,  hematocrit 34.7, and platelets 186.  Protime 10.7.  INR is 1.  The  patient will go home on oral analgesic if it is needed.      Sueanne Margarita, [place]      Thompson Grayer, MD  Electronically Signed    GM/MEDQ  D:  02/05/2009  T:  02/05/2009  Job:  [id number]   cc:   Jeanann Lewandowsky, M.D.  Elizabeth Sauer, M.D.  Minus Breeding, MD, [facility]

## 2011-02-28 NOTE — Op Note (Signed)
[name], [name]              ACCOUNT NO.:  [id number]   MEDICAL RECORD NO.:  [id number]          PATIENT TYPE:  INP   LOCATION:  3312                         FACILITY:  [place]   PHYSICIAN:  Elizabeth Sauer, M.D.      DATE OF BIRTH:  1930-04-23   DATE OF PROCEDURE:  03/02/2009  DATE OF DISCHARGE:                               OPERATIVE REPORT   PREOPERATIVE DIAGNOSIS:  Spinal stenosis L2-3, L3-4.   POSTOPERATIVE DIAGNOSIS:  Spinal stenosis L2-3, L3-4.   OPERATIVE PROCEDURE:  Left L2-3 and L3-4 hemilaminectomy with  undercutting of the right side of L2 and L3.   SURGEON:  Elizabeth Sauer, MD   ANESTHESIA:  General endotracheal.   PREPARATIONS:  Prepped and draped with alcohol wipe.   COMPLICATIONS:  None.   NURSE ASSISTANT:  Leda Quail, MD   DOCTOR ASSISTANT:  Hosie Spangle, MD   BODY OF TEXT:  This is a 77 year old lady with spinal stenosis at L2-3  and to a lesser extent at L3-4.   Taken to the operating room, smoothly anesthetized, intubated, and  placed prone on the operating table.  Following shave, prep and drape in  the usual sterile fashion, skin was infiltrated from the top of L2 to  the bottom of L3 and the lamina of L2 and L3 were exposed bilaterally in  subperiosteal plane.  Intraoperative x-ray confirmed correctness of  level.  Having confirmed correctness of level, hemilaminectomy of L2 and  L3 was carried out with a high-speed drill.  The ligamentum flavum was  removed.  The spinous process was undercut with the drill and the right-  sided space under the lamina, posterior epidural space was exposed.  Ligamentum flavum was then removed from the right side bilaterally,  resulting in decompression.  The wound was irrigated.  Hemostasis  assured.  Depo-Medrol-soaked fat was placed in laminectomy defect.  Successive layers of 0 Vicryl, 2-0 Vicryl, and 3-0 nylon were used to  close.  Betadine and Telfa dressing was applied, made occlusive with  OpSite, and the  patient returned to recovery room in good condition.           ______________________________  Elizabeth Sauer, M.D.     MWR/MEDQ  D:  03/02/2009  T:  03/03/2009  Job:  [id number]

## 2011-02-28 NOTE — Op Note (Signed)
[name], [name]              ACCOUNT NO.:  [id number]   MEDICAL RECORD NO.:  [id number]          PATIENT TYPE:  INP   LOCATION:  [id number]                         FACILITY:  [place]   PHYSICIAN:  Thompson Grayer, MD       DATE OF BIRTH:  1930/02/25   DATE OF PROCEDURE:  02/04/2009  DATE OF DISCHARGE:                               OPERATIVE REPORT   SURGEON:  Thompson Grayer, MD   PREPROCEDURE DIAGNOSES:  1. Sinus node dysfunction.  2. Symptomatic bradycardia.  3. Mobitz I second-degree arteriovenous block.   POSTPROCEDURE DIAGNOSES:  1. Sinus node dysfunction.  2. Symptomatic bradycardia.  3. Mobitz I second-degree arteriovenous block.   PROCEDURES:  Dual-chamber pacemaker implantation.   INTRODUCTION:  [name] is a pleasant 77 year old female with a  history of obesity, hypertension, diabetes, and obstructive sleep apnea.  She reports longstanding symptoms of palpitations.  A recent Holter  monitor documented sinus node dysfunction with junctional rhythm in the  40s, which corresponded to her symptoms.  She therefore presents today  for dual-chamber pacemaker implantation.   DESCRIPTION OF PROCEDURE:  Informed and written consent was obtained,  and the patient was brought to the electrophysiology lab in the fasting  state.  She was adequately sedated with intravenous Valium as outlined  in the nursing report.  The patient's left chest was prepped and draped  in the usual sterile fashion by the EP lab staff.  The skin overlying  the left deltopectoral region was infiltrated with lidocaine for local  analgesia.  A 5-cm incision was made over the left deltopectoral region.  A left subcutaneous pacemaker pocket was fashioned using a combination  of sharp and blunt dissection.  Electrocautery was used to assure  hemostasis.  With fluoroscopic visualization, the left axillary vein was  cannulated using a modified percutaneous Seldinger technique.  Through  the left axillary vein, a  St. Jude Medical Tendril SDX, model 1688TC -  46 (serial number [id number]) right atrial lead and a St. Jude Medical  Tendril SDX, model 1688TC - 58 (serial number [id number]) right  ventricular lead were advanced with fluoroscopic visualization into the  right atrial appendage and right ventricular apex positions  respectively.  Initial lead measurements revealed an atrial lead P-wave  of 5 mV with an impedance of 556 ohms and a threshold of 1.4 V at 0.5  msec.  The right ventricular lead R-wave measured 16 mV with an  impedance of 573 ohms and a threshold of 0.4 V at 0.5 msec.  The leads  were then secured to the pectoralis fascia using #2 silk suture.  The  pocket was then irrigated with copious gentamicin solution.  Electrocautery was again used to assure hemostasis.  The leads were then  connected to a [place] (serial  number [id number]) dual-chamber pacemaker.  The pacemaker was then placed  into the previously fashioned left subcutaneous pacemaker pocket and  secured to the pectoralis fascia using #2 silk suture.  The pocket was  then closed in 2 layers with 2.0 Vicryl suture for the  subcutaneous and  subcuticular layers.  Steri-Strips and sterile dressings were then  applied.  There were no early apparent complications.   CONCLUSIONS:  1. Sinus node dysfunction with symptomatic bradycardia.  2. Successful dual-chamber pacemaker implantation.  3. No early apparent complications.      Thompson Grayer, MD  Electronically Signed     JA/MEDQ  D:  02/04/2009  T:  02/04/2009  Job:  [id number]   cc:   Minus Breeding, MD, [facility]  Jeanann Lewandowsky, M.D.  Vonna Kotyk. Durward Fortes, M.D.

## 2011-03-03 NOTE — Procedures (Signed)
REFERRING PHYSICIAN:  Elizabeth Sauer, M.D.   CLINICAL HISTORY:  [age]-year-old lady with episode of  unresponsiveness being evaluated for possible absent seizures.   MEDICATION LISTED:  Lotrel, Toprol, torsemide, Amaryl, potassium, __________  .   STUDY:  This is a sleep-deprived EEG study performed using 17-channel and 10-  20 standard electrode placement.   The background rhythm consists of 7-8 Hz alpha which is mixed with fair  amount of low amplitude __________  activity which is seen throughout the  recording.  Minor intermittent __________  is seen bilaterally in the  temporal and central head regions.  The sleep stages I and II are achieved  and show normal physiological findings.  No paroxysmal epileptiform activity  spikes or sharp waves are seen.  Hyperventilation reveals no significant  abnormalities.  Intermittent photic stimulation reveals asymmetric driving  response.  Length of the tracing is 28 minutes.  Technical component is  adequate.  EKG tracing reveals regular sinus rhythm.   IMPRESSION:  This EEG performed in wakeful states and sleep is within normal  limits.  No apparent epileptiform activity identified.      [id number]  D:  07/14/2004 15:39:28  T:  07/14/2004 21:42:06  Job #:  [id number]   cc:   Elizabeth Sauer, M.D.  [place]  [place] [address]  Fax: [phone]

## 2011-03-03 NOTE — Procedures (Signed)
[name], CREA              ACCOUNT NO.:  [id number]   MEDICAL RECORD NO.:  [id number]          PATIENT TYPE:  OUT   LOCATION:  SLEEP CENTER                 FACILITY:  [facility]   PHYSICIAN:  Clinton D. Annamaria Boots, M.D. DATE OF BIRTH:  28-Nov-1929   DATE OF STUDY:  08/02/2004                              NOCTURNAL POLYSOMNOGRAM   REFERRING PHYSICIAN:  Jeanann Lewandowsky, M.D.   INDICATION FOR STUDY:  Hypersomnia with sleep apnea.   EPWORTH SLEEPINESS SCORE:  12/24   NECK SIZE:  15-1/2 inches   BODY MASS INDEX:  38.6   WEIGHT:  205 pounds   SLEEP ARCHITECTURE:  Very short total sleep time 143 minutes with sleep  efficiency 35%.  There were frequent awakenings and arousals and frequent  position changes.  Not all awakenings were explained by respiratory events  or leg jerks.  Stage I was 34%, stage II was 66%, stages III, IV, and REM  were absent.  Sleep latency was 94 minutes.  Awake after sleep onset 176  minutes.  Arousal index 97/hr which is increased.  No sleep meds were taken.   RESPIRATORY DATA:  NPSG protocol.  RDI 41/hr which is consistent with  moderately severe obstructive sleep apnea/hypopnea syndrome.  There was 1  obstructive apnea and 97 hypopneas.  Events were not positional.   OXYGEN DATA:  Mild to moderate snoring with oxygen desaturation to a nadir  of 88%.  Mean oxygen saturation through the study was 93% on room air.   CARDIAC DATA:  Normal sinus rhythm with occasional PAC.   MOVEMENT/PARASOMNIA:  One hundred fifty limb jerks were recorded of which 29  were associated with arousal or awakening for a periodic limb movement with  arousal index of 12/hr which is increased.  Bathroom trips x5.   IMPRESSION/RECOMMENDATION:  Moderately severe obstructive sleep  apnea/hypopnea syndrome, respiratory disturbance index of 41/hr with  desaturation to 88%.  Periodic limb movement with arousal, 12/hr.  Nocturia.  Nonspecific additional difficulty maintaining sleep.  Suggest  return for  continuous positive airway pressure titration  bringing a sleep medication.  Subsequent clinical consideration may be  appropriate for treatment of periodic limb movement for instance with  clonazepam or Requip.                                                           Clinton D. Annamaria Boots, M.D.  Diplomate, American Board   CDY/MEDQ  D:  08/07/2004 09:50:04  T:  08/07/2004 20:43:02  Job:  [id number]

## 2011-03-03 NOTE — Assessment & Plan Note (Signed)
[name]                             PULMONARY OFFICE NOTE   NAME:West, [name]                     MRN:          [id number]  DATE:02/12/2007                            DOB:          [birth year]    PROBLEM LIST:  1. Obstructive sleep apnea.  2. Periodic limb movement.  3. Allergic rhinitis.  4. Hypertension.  5. Diabetes, type 2.  6. Carotid bruit (Dr. Percival Spanish).   HISTORY OF PRESENT ILLNESS:  She is using CPAP regularly now and finds  that she does quite well with it once she recovered from a cold in early  March.  She can keep it on 6-8 hours and admits she is less tired,  generally sleeping better.  She thinks it helped her to come off of  Toprol, except that without that medicine she is awakened sometimes in  the early morning by a hard, steady heartbeat.  She quit Lunesta, taking  Skelaxin or quinine for muscle cramps at night, but does not seem to be  having leg jerks.  She has active dreams.   MEDICATIONS:  1. Spironolactone 25 mg every other day.  2. Tekturna 300 mg.  3. Diovan 160/25.  4. Multivitamin and Omega.  5. Celebrex.  6. Provax.  7. Norvasc 10 mg.  8. Nexium 40 mg p.r.n.  9. Amaryl p.r.n.  10.Zyrtec p.r.n.  11.Flonase p.r.n.  12.Valium p.r.n.  13.Skelaxin p.r.n.   ALLERGIES:  PENICILLIN, VICODIN, CARDIZEM AND MORPHINE.   OBJECTIVE:  VITAL SIGNS:  Weight 210 pounds, BP 124/66, pulse regular  and 61, room air saturation 99%.  HEART:  Heart sounds are regular without extra beats now.  LUNGS:  There is very minimal chest congestion that she blames on  pollen.  No wheeze, no dullness.  Nasal airway is not obstructed.  There  is no thyromegaly or stridor.  No restlessness or tremor.   IMPRESSION/PLAN:  1. Obstructive sleep apnea seems well-controlled on continuous      positive airway pressure at 8 CWP.  I have encouraged weight loss.  2. Her combination of early morning waking with pounding heart rate      and  more vivid dreams raises the possibility that she is waking      during REM when there is altered autonomic nervous system control.      She complains particularly of this pounding heart sensation.  We      talked briefly about possible trial of antidepressant drugs because      they tend to suppress REM sleep.  I wondered whether there would be      a favorable trade off between the pound heart rhythm and risk of      arrhythmia associated with tricyclic antidepressants.  We talked      through this and she chose not to try the antidepressant at this      time.  She will talk over her cardiac symptoms with Dr. Percival Spanish      when she sees him next.  I suggested she try Clarinex 5 mg daily      p.r.n.  to help some with a bit of nasal congestion this spring.      She is going to work on her weight, continue continuous positive      airway pressure and return in a year for followup unless needed      sooner.     Clinton D. Annamaria Boots, MD, [facility], FACP  Electronically Signed    CDY/MedQ  DD: 02/12/2007  DT: 02/13/2007  Job #: [id number]   cc:   Jeanann Lewandowsky, M.D.

## 2011-03-03 NOTE — Procedures (Signed)
[facility]  Patient:    [name], [name] Visit Number: [id number] MRN: [id number]          Service Type: END Location: ENDO Attending Physician:  Ernie Avena Dictated by:   Cleotis Nipper, M.D. Proc. Date: 03/17/02 Admit Date:  03/17/2002   CC:         Don Broach. Carlis Abbott, M.D.   Procedure Report  PROCEDURE PERFORMED:  Colonoscopy with polypectomy and biopsy.  ENDOSCOPIST:  Cleotis Nipper, M.D.  INDICATIONS FOR PROCEDURE:  The patient is a 77 year old African-American female with a family history of colon cancer and a small adenomatous polyp having been removed on screening colonoscopy five years ago.  FINDINGS:  Two small polyps in the ascending colon.  Right-sided colonic diverticulosis.  DESCRIPTION OF PROCEDURE:  The nature, purpose and risks of the procedure had been discussed with the patient from prior examination and she provided written consent.  Sedation was fentanyl 120 mcg and Versed 12 mg IV without arrhythmias or desaturation.  The Olympus adult video colonoscope was advanced with some difficulty due to looping and angulation.  Ultimately, we reached what I believe was the base of the cecum; however, there was a film of fairly thick algae-like stool coating the proximal colon and this would not wash off.  I think I was able to make out the appendiceal orifice and the ileocecal valve.  There was a cecal diverticulum and some right-sided colonic diverticulosis although interestingly I did not notice diverticulosis on the left side of the colon.  There was a small 3 to 4 mm semipedunculated polyp removed by snare technique in the proximal ascending colon and a diminutive 3 mm sessile polyp removed by two cold biopsies in the midascending colon.  No other polyps were seen and there was no evidence of large polyps, cancer, colitis or vascular malformations.  Retroflexion in the rectum could not readily be  accomplished but antegrade viewing disclosed no distal rectal lesions and reinspection of the rectosigmoid was unremarkable.  The patient tolerated the procedure well and there were no apparent complications.  IMPRESSION: 1. Diminutive and small proximal colonic polyps, removed as described above. 2. Apparent right-sided diverticulosis without evident left-sided    diverticulosis. 3. Family history of colon cancer. 4. Prior history of colonic adenoma.  PLAN:  Await pathology.  Anticipate follow-up colonoscopy in three to five years.  PLAN: Consider follow-up colonoscopy in five years in view of the family history of colon cancer.Dictated by:   Cleotis Nipper, M.D. Attending Physician:  Ernie Avena DD:  03/17/02 TD:  03/18/02 Job: [id number]

## 2011-03-23 ENCOUNTER — Encounter: Payer: Private Health Insurance - Indemnity | Admitting: *Deleted

## 2011-03-28 ENCOUNTER — Encounter: Payer: Self-pay | Admitting: *Deleted

## 2011-05-11 ENCOUNTER — Ambulatory Visit (INDEPENDENT_AMBULATORY_CARE_PROVIDER_SITE_OTHER): Payer: Medicare Other | Admitting: *Deleted

## 2011-05-11 DIAGNOSIS — I495 Sick sinus syndrome: Secondary | ICD-10-CM

## 2011-05-12 ENCOUNTER — Other Ambulatory Visit: Payer: Self-pay

## 2011-05-12 LAB — REMOTE PACEMAKER DEVICE
AL AMPLITUDE: 3.4 mv
AL IMPEDENCE PM: 400 Ohm
ATRIAL PACING PM: 100
BAMS-0001: 150 {beats}/min
BRDY-0002RV: 70 {beats}/min
BRDY-0004RV: 105 {beats}/min
DEVICE MODEL PM: 2284731
RV LEAD AMPLITUDE: 11.7 mv
RV LEAD IMPEDENCE PM: 590 Ohm
RV LEAD THRESHOLD: 1.25 V
VENTRICULAR PACING PM: 66

## 2011-05-16 NOTE — Progress Notes (Signed)
Pacer remote

## 2011-05-30 ENCOUNTER — Encounter: Payer: Self-pay | Admitting: *Deleted

## 2011-08-03 ENCOUNTER — Encounter: Payer: Medicare Other | Admitting: *Deleted

## 2011-08-07 ENCOUNTER — Encounter: Payer: Self-pay | Admitting: *Deleted

## 2011-08-10 ENCOUNTER — Encounter: Payer: Medicare Other | Admitting: *Deleted

## 2011-08-10 ENCOUNTER — Ambulatory Visit (INDEPENDENT_AMBULATORY_CARE_PROVIDER_SITE_OTHER): Payer: Medicare Other | Admitting: *Deleted

## 2011-08-10 ENCOUNTER — Telehealth: Payer: Self-pay | Admitting: Internal Medicine

## 2011-08-10 DIAGNOSIS — I495 Sick sinus syndrome: Secondary | ICD-10-CM

## 2011-08-10 NOTE — Telephone Encounter (Signed)
Pt has a question about her transmission.  Please call her regarding same

## 2011-08-10 NOTE — Telephone Encounter (Signed)
Transmission received, patient aware. 

## 2011-08-11 ENCOUNTER — Encounter: Payer: Self-pay | Admitting: Internal Medicine

## 2011-08-14 ENCOUNTER — Encounter: Payer: Self-pay | Admitting: *Deleted

## 2011-08-17 NOTE — Progress Notes (Signed)
PPM remote 

## 2011-09-25 ENCOUNTER — Encounter: Payer: Self-pay | Admitting: *Deleted

## 2011-09-25 ENCOUNTER — Ambulatory Visit (INDEPENDENT_AMBULATORY_CARE_PROVIDER_SITE_OTHER): Payer: Medicare Other | Admitting: Internal Medicine

## 2011-09-25 ENCOUNTER — Encounter: Payer: Self-pay | Admitting: Internal Medicine

## 2011-09-25 DIAGNOSIS — I10 Essential (primary) hypertension: Secondary | ICD-10-CM

## 2011-09-25 DIAGNOSIS — I498 Other specified cardiac arrhythmias: Secondary | ICD-10-CM

## 2011-09-25 LAB — PACEMAKER DEVICE OBSERVATION
AL IMPEDENCE PM: 400 Ohm
AL THRESHOLD: 1 V
ATRIAL PACING PM: 100
BAMS-0001: 150 {beats}/min
BATTERY VOLTAGE: 2.9178 V
DEVICE MODEL PM: 2284731
RV LEAD AMPLITUDE: 10.2 mv
RV LEAD IMPEDENCE PM: 575 Ohm
RV LEAD THRESHOLD: 1.25 V
VENTRICULAR PACING PM: 68

## 2011-09-25 NOTE — Assessment & Plan Note (Signed)
Normal pacemaker function See Pace Art report No changes today  

## 2011-09-25 NOTE — Patient Instructions (Signed)
Your physician recommends that you continue on your current medications as directed. Please refer to the Current Medication list given to you today.  Your physician wants you to follow-up in: 12 months.  You will receive a reminder letter in the mail two months in advance. If you don't receive a letter, please call our office to schedule the follow-up appointment.   2 Gram Low Sodium Diet A 2 gram sodium diet restricts the amount of sodium in the diet to no more than 2 g or 2000 mg daily. Limiting the amount of sodium is often used to help lower blood pressure. It is important if you have heart, liver, or kidney problems. Many foods contain sodium for flavor and sometimes as a preservative. When the amount of sodium in a diet needs to be low, it is important to know what to look for when choosing foods and drinks. The following includes some information and guidelines to help make it easier for you to adapt to a low sodium diet. QUICK TIPS  Do not add salt to food.   Avoid convenience items and fast food.   Choose unsalted snack foods.   Buy lower sodium products, often labeled as "lower sodium" or "no salt added."   Check food labels to learn how much sodium is in 1 serving.   When eating at a restaurant, ask that your food be prepared with less salt or none, if possible.  READING FOOD LABELS FOR SODIUM INFORMATION The nutrition facts label is a good place to find how much sodium is in foods. Look for products with no more than 500 to 600 mg of sodium per meal and no more than 150 mg per serving. Remember that 2 g = 2000 mg. The food label may also list foods as:  Sodium-free: Less than 5 mg in a serving.   Very low sodium: 35 mg or less in a serving.   Low-sodium: 140 mg or less in a serving.   Light in sodium: 50% less sodium in a serving. For example, if a food that usually has 300 mg of sodium is changed to become light in sodium, it will have 150 mg of sodium.   Reduced sodium:  25% less sodium in a serving. For example, if a food that usually has 400 mg of sodium is changed to reduced sodium, it will have 300 mg of sodium.  CHOOSING FOODS Grains  Avoid: Salted crackers and snack items. Some cereals, including instant hot cereals. Bread stuffing and biscuit mixes. Seasoned rice or pasta mixes.   Choose: Unsalted snack items. Low-sodium cereals, oats, puffed wheat and rice, shredded wheat. English muffins and bread. Pasta.  Meats  Avoid: Salted, canned, smoked, spiced, pickled meats, including fish and poultry. Bacon, ham, sausage, cold cuts, hot dogs, anchovies.   Choose: Low-sodium canned tuna and salmon. Fresh or frozen meat, poultry, and fish.  Dairy  Avoid: Processed cheese and spreads. Cottage cheese. Buttermilk and condensed milk. Regular cheese.   Choose: Milk. Low-sodium cottage cheese. Yogurt. Sour cream. Low-sodium cheese.  Fruits and Vegetables  Avoid: Regular canned vegetables. Regular canned tomato sauce and paste. Frozen vegetables in sauces. Olives. Angie Fava. Relishes. Sauerkraut.   Choose: Low-sodium canned vegetables. Low-sodium tomato sauce and paste. Frozen or fresh vegetables. Fresh and frozen fruit.  Condiments  Avoid: Canned and packaged gravies. Worcestershire sauce. Tartar sauce. Barbecue sauce. Soy sauce. Steak sauce. Ketchup. Onion, garlic, and table salt. Meat flavorings and tenderizers.   Choose: Fresh and dried herbs and  spices. Low-sodium varieties of mustard and ketchup. Lemon juice. Tabasco sauce. Horseradish.  SAMPLE 2 GRAM SODIUM MEAL PLAN Breakfast / Sodium (mg)  1 cup low-fat milk / [id number] mg   2 slices whole-wheat toast / 270 mg   1 tbs heart-healthy margarine / 153 mg   1 hard-boiled egg / 139 mg   1 small orange / 0 mg  Lunch / Sodium (mg)  1 cup raw carrots / 76 mg    cup hummus / 298 mg   1 cup low-fat milk / 143 mg    cup red grapes / 2 mg   1 whole-wheat pita bread / 356 mg  Dinner / Sodium (mg)  1  cup whole-wheat pasta / 2 mg   1 cup low-sodium tomato sauce / 73 mg   3 oz lean ground beef / 57 mg   1 small side salad (1 cup raw spinach leaves,  cup cucumber,  cup yellow bell pepper) with 1 tsp olive oil and 1 tsp red wine vinegar / 25 mg  Snack / Sodium (mg)  1 container low-fat vanilla yogurt / 107 mg   3 graham cracker squares / 127 mg  Nutrient Analysis  Calories: 2033   Protein: 77 g   Carbohydrate: 282 g   Fat: 72 g   Sodium: 1971 mg  Document Released: 10/02/2005 Document Revised: 06/14/2011 Document Reviewed: 01/03/2010 [facility] Patient Information 2012 [place], [place].

## 2011-09-25 NOTE — Progress Notes (Signed)
PCP:  Foye Spurling, MD, MD  The patient presents today for routine electrophysiology followup.  Since last being seen in our clinic, the patient reports doing very well.  She has rare BLE edema and mild orthopnea in the early morning.  Today, she denies symptoms of palpitations, chest pain, shortness of breath, dizziness, presyncope, syncope, or neurologic sequela.  The patient feels that she is tolerating medications without difficulties and is otherwise without complaint today.   Past Medical History  Diagnosis Date  . 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 HYPERTENSION 02/24/2008  . CAROTID BRUIT 02/24/2008  . DIABETES MELLITUS, TYPE II 02/24/2008  . Benign paroxysmal positional vertigo 04/06/2010   Past Surgical History  Procedure Date  . Pacemaker insertion     Current Outpatient Prescriptions  Medication Sig Dispense Refill  . allopurinol (ZYLOPRIM) 100 MG tablet Take 100 mg by mouth daily.        . carvedilol (COREG) 12.5 MG tablet Take 12.5 mg by mouth 2 (two) times daily with a meal.        . celecoxib (CELEBREX) 200 MG capsule Take 200 mg by mouth daily.        . cetirizine (ZYRTEC) 10 MG tablet Take 10 mg by mouth daily.        Marland Kitchen dexlansoprazole (DEXILANT) 60 MG capsule Take 60 mg by mouth daily.        Marland Kitchen glimepiride (AMARYL) 1 MG tablet Take 1 mg by mouth daily before breakfast. Patient takes 1/2 tab in am and 1/2tab in the pm        . latanoprost (XALATAN) 0.005 % ophthalmic solution Place 1 drop into both eyes at bedtime.      . Metaxalone (SKELAXIN PO) Take by mouth as needed.        . Multiple Vitamin (MULTIVITAMIN) capsule Take 1 capsule by mouth daily.        Marland Kitchen PRECISION XTRA TEST STRIPS test strip As directed      . spironolactone (ALDACTONE) 25 MG tablet Take 25 mg by mouth every other day.        . valsartan-hydrochlorothiazide (DIOVAN-HCT) 160-25 MG per tablet Take 1 tablet by mouth daily.          Allergies  Allergen Reactions  . Aspirin   . Diltiazem Hcl   .  Hydrocodone-Acetaminophen   . Morphine   . Penicillins     History   Social History  . Marital Status: Married    Spouse Name: N/A    Number of Children: N/A  . Years of Education: N/A   Occupational History  . Not on file.   Social History Main Topics  . Smoking status: Never Smoker   . Smokeless tobacco: Not on file  . Alcohol Use: No  . Drug Use: No  . Sexually Active: Not on file   Other Topics Concern  . Not on file   Social History Narrative  . No narrative on file     Physical Exam: Filed Vitals:   09/25/11 1224  BP: 118/66  Pulse: 68  Height: 5\' 4"  (1.626 m)  Weight: 200 lb (90.719 kg)    GEN- The patient is well appearing, alert and oriented x 3 today.   Head- normocephalic, atraumatic Eyes-  Sclera clear, conjunctiva pink Ears- hearing intact Oropharynx- clear Neck- supple, no JVP Lymph- no cervical lymphadenopathy Lungs- Clear to ausculation bilaterally, normal work of breathing Chest- pacemaker pocket is well healed Heart- Regular rate and rhythm,  no murmurs, rubs or gallops, PMI not laterally displaced GI- soft, NT, ND, + BS Extremities- no clubbing, cyanosis, trace edema MS- no significant deformity or atrophy Skin- no rash or lesion Psych- euthymic mood, full affect Neuro- strength and sensation are intact  Pacemaker interrogation- reviewed in detail today,  See PACEART report  Assessment and Plan:

## 2011-09-25 NOTE — Assessment & Plan Note (Signed)
Stable No change required today  She has mild edema and rare orthopnea.  I have advised to avoid celebrex as able. 2 gram sodium restriction advised. She will contact my office if not improved.

## 2011-12-28 ENCOUNTER — Encounter: Payer: Medicare Other | Admitting: *Deleted

## 2011-12-29 ENCOUNTER — Other Ambulatory Visit: Payer: Self-pay | Admitting: Gastroenterology

## 2012-01-01 ENCOUNTER — Encounter: Payer: Self-pay | Admitting: *Deleted

## 2012-01-09 ENCOUNTER — Telehealth: Payer: Self-pay | Admitting: Internal Medicine

## 2012-01-09 ENCOUNTER — Encounter: Payer: Self-pay | Admitting: Internal Medicine

## 2012-01-09 ENCOUNTER — Ambulatory Visit (INDEPENDENT_AMBULATORY_CARE_PROVIDER_SITE_OTHER): Payer: Medicare Other | Admitting: *Deleted

## 2012-01-09 DIAGNOSIS — I498 Other specified cardiac arrhythmias: Secondary | ICD-10-CM

## 2012-01-09 LAB — REMOTE PACEMAKER DEVICE
AL IMPEDENCE PM: 380 Ohm
ATRIAL PACING PM: 100
BAMS-0001: 150 {beats}/min
BATTERY VOLTAGE: 2.92 V
BRDY-0002RV: 70 {beats}/min
BRDY-0004RV: 105 {beats}/min
DEVICE MODEL PM: 2284731
RV LEAD AMPLITUDE: 10.9 mv
RV LEAD IMPEDENCE PM: 510 Ohm
RV LEAD THRESHOLD: 1.125 V
VENTRICULAR PACING PM: 63

## 2012-01-09 NOTE — Telephone Encounter (Signed)
New Problem:     Patient called in with some concerns about her last remote check that she received a missed appointment letter for. Please call back.

## 2012-01-10 NOTE — Telephone Encounter (Signed)
Transmission was received. Spoke w/husband to let know. Instructed to husband will send letter with next appointment and husband understands.

## 2012-01-16 NOTE — Progress Notes (Signed)
Remote pacer check  

## 2012-02-01 ENCOUNTER — Encounter: Payer: Self-pay | Admitting: *Deleted

## 2012-03-25 ENCOUNTER — Other Ambulatory Visit: Payer: Self-pay | Admitting: Internal Medicine

## 2012-03-25 ENCOUNTER — Ambulatory Visit (HOSPITAL_COMMUNITY)
Admission: RE | Admit: 2012-03-25 | Discharge: 2012-03-25 | Disposition: A | Discharge status: Home / Self Care | Payer: Medicare Other | Source: Ambulatory Visit | Attending: Internal Medicine | Admitting: Internal Medicine

## 2012-03-25 DIAGNOSIS — R059 Cough, unspecified: Secondary | ICD-10-CM

## 2012-03-25 DIAGNOSIS — Z950 Presence of cardiac pacemaker: Secondary | ICD-10-CM | POA: Insufficient documentation

## 2012-03-25 DIAGNOSIS — R05 Cough: Secondary | ICD-10-CM | POA: Insufficient documentation

## 2012-03-25 DIAGNOSIS — R0981 Nasal congestion: Secondary | ICD-10-CM

## 2012-03-25 DIAGNOSIS — J00 Acute nasopharyngitis [common cold]: Secondary | ICD-10-CM | POA: Insufficient documentation

## 2012-03-25 DIAGNOSIS — R0989 Other specified symptoms and signs involving the circulatory and respiratory systems: Secondary | ICD-10-CM | POA: Insufficient documentation

## 2012-03-25 DIAGNOSIS — I517 Cardiomegaly: Secondary | ICD-10-CM | POA: Insufficient documentation

## 2012-05-03 ENCOUNTER — Encounter: Payer: Medicare Other | Admitting: *Deleted

## 2012-09-25 ENCOUNTER — Ambulatory Visit (HOSPITAL_COMMUNITY)
Admission: RE | Admit: 2012-09-25 | Discharge: 2012-09-25 | Disposition: A | Discharge status: Home / Self Care | Payer: Medicare Other | Source: Ambulatory Visit | Attending: Internal Medicine | Admitting: Internal Medicine

## 2012-09-25 ENCOUNTER — Other Ambulatory Visit: Payer: Self-pay | Admitting: Internal Medicine

## 2012-09-25 DIAGNOSIS — R52 Pain, unspecified: Secondary | ICD-10-CM

## 2012-09-25 DIAGNOSIS — R079 Chest pain, unspecified: Secondary | ICD-10-CM | POA: Insufficient documentation

## 2012-09-25 DIAGNOSIS — M549 Dorsalgia, unspecified: Secondary | ICD-10-CM | POA: Insufficient documentation

## 2012-09-25 DIAGNOSIS — IMO0002 Reserved for concepts with insufficient information to code with codable children: Secondary | ICD-10-CM | POA: Insufficient documentation

## 2012-10-05 ENCOUNTER — Encounter (HOSPITAL_COMMUNITY): Payer: Self-pay | Admitting: Emergency Medicine

## 2012-10-05 ENCOUNTER — Emergency Department (HOSPITAL_COMMUNITY)
Admission: EM | Admit: 2012-10-05 | Discharge: 2012-10-05 | Disposition: A | Discharge status: Home / Self Care | Payer: Medicare Other | Attending: Emergency Medicine | Admitting: Emergency Medicine

## 2012-10-05 ENCOUNTER — Emergency Department (HOSPITAL_COMMUNITY): Payer: Medicare Other

## 2012-10-05 DIAGNOSIS — R141 Gas pain: Secondary | ICD-10-CM | POA: Insufficient documentation

## 2012-10-05 DIAGNOSIS — R110 Nausea: Secondary | ICD-10-CM | POA: Insufficient documentation

## 2012-10-05 DIAGNOSIS — R143 Flatulence: Secondary | ICD-10-CM | POA: Insufficient documentation

## 2012-10-05 DIAGNOSIS — R142 Eructation: Secondary | ICD-10-CM | POA: Insufficient documentation

## 2012-10-05 DIAGNOSIS — M549 Dorsalgia, unspecified: Secondary | ICD-10-CM | POA: Insufficient documentation

## 2012-10-05 DIAGNOSIS — I10 Essential (primary) hypertension: Secondary | ICD-10-CM | POA: Insufficient documentation

## 2012-10-05 DIAGNOSIS — Z8679 Personal history of other diseases of the circulatory system: Secondary | ICD-10-CM | POA: Insufficient documentation

## 2012-10-05 DIAGNOSIS — Z8669 Personal history of other diseases of the nervous system and sense organs: Secondary | ICD-10-CM | POA: Insufficient documentation

## 2012-10-05 DIAGNOSIS — E119 Type 2 diabetes mellitus without complications: Secondary | ICD-10-CM | POA: Insufficient documentation

## 2012-10-05 DIAGNOSIS — R109 Unspecified abdominal pain: Secondary | ICD-10-CM

## 2012-10-05 DIAGNOSIS — Z79899 Other long term (current) drug therapy: Secondary | ICD-10-CM | POA: Insufficient documentation

## 2012-10-05 DIAGNOSIS — Z7982 Long term (current) use of aspirin: Secondary | ICD-10-CM | POA: Insufficient documentation

## 2012-10-05 DIAGNOSIS — R140 Abdominal distension (gaseous): Secondary | ICD-10-CM

## 2012-10-05 DIAGNOSIS — R1013 Epigastric pain: Secondary | ICD-10-CM | POA: Insufficient documentation

## 2012-10-05 LAB — COMPREHENSIVE METABOLIC PANEL
ALT: 14 U/L (ref 0–35)
AST: 18 U/L (ref 0–37)
Albumin: 4 g/dL (ref 3.5–5.2)
Alkaline Phosphatase: 45 U/L (ref 39–117)
BUN: 35 mg/dL — ABNORMAL HIGH (ref 6–23)
CO2: 27 mEq/L (ref 19–32)
Calcium: 9.5 mg/dL (ref 8.4–10.5)
Chloride: 98 mEq/L (ref 96–112)
Creatinine, Ser: 1.22 mg/dL — ABNORMAL HIGH (ref 0.50–1.10)
GFR calc Af Amer: 47 mL/min — ABNORMAL LOW (ref >90)
GFR calc non Af Amer: 41 mL/min — ABNORMAL LOW (ref >90)
Glucose, Bld: 107 mg/dL — ABNORMAL HIGH (ref 70–99)
Potassium: 4.2 mEq/L (ref 3.5–5.1)
Sodium: 137 mEq/L (ref 135–145)
Total Bilirubin: 0.6 mg/dL (ref 0.3–1.2)
Total Protein: 6.8 g/dL (ref 6.0–8.3)

## 2012-10-05 LAB — CBC WITH DIFFERENTIAL/PLATELET
Basophils Absolute: 0 10*3/uL (ref 0.0–0.1)
Basophils Relative: 0 % (ref 0–1)
Eosinophils Absolute: 0.1 10*3/uL (ref 0.0–0.7)
Eosinophils Relative: 1 % (ref 0–5)
HCT: 41.1 % (ref 36.0–46.0)
Hemoglobin: 13.6 g/dL (ref 12.0–15.0)
Lymphocytes Relative: 27 % (ref 12–46)
Lymphs Abs: 2.9 10*3/uL (ref 0.7–4.0)
MCH: 26.8 pg (ref 26.0–34.0)
MCHC: 33.1 g/dL (ref 30.0–36.0)
MCV: 81.1 fL (ref 78.0–100.0)
Monocytes Absolute: 0.9 10*3/uL (ref 0.1–1.0)
Monocytes Relative: 9 % (ref 3–12)
Neutro Abs: 6.9 10*3/uL (ref 1.7–7.7)
Neutrophils Relative %: 64 % (ref 43–77)
Platelets: 257 10*3/uL (ref 150–400)
RBC: 5.07 MIL/uL (ref 3.87–5.11)
RDW: 15.4 % (ref 11.5–15.5)
WBC: 10.9 10*3/uL — ABNORMAL HIGH (ref 4.0–10.5)

## 2012-10-05 LAB — URINALYSIS, ROUTINE W REFLEX MICROSCOPIC
Bilirubin Urine: NEGATIVE
Glucose, UA: NEGATIVE mg/dL
Hgb urine dipstick: NEGATIVE
Ketones, ur: NEGATIVE mg/dL
Nitrite: NEGATIVE
Protein, ur: NEGATIVE mg/dL
Specific Gravity, Urine: 1.019 (ref 1.005–1.030)
Urobilinogen, UA: 1 mg/dL (ref 0.0–1.0)
pH: 6.5 (ref 5.0–8.0)

## 2012-10-05 LAB — LIPASE, BLOOD: Lipase: 43 U/L (ref 11–59)

## 2012-10-05 LAB — URINE MICROSCOPIC-ADD ON

## 2012-10-05 LAB — LACTIC ACID, PLASMA: Lactic Acid, Venous: 0.9 mmol/L (ref 0.5–2.2)

## 2012-10-05 LAB — TROPONIN I: Troponin I: <0.3 ng/mL (ref <0.30)

## 2012-10-05 MED ORDER — GI COCKTAIL ~~LOC~~
30.0000 mL | Freq: Once | ORAL | Status: AC
  Administered 2012-10-05: 30 mL via ORAL
  Filled 2012-10-05: qty 30

## 2012-10-05 MED ORDER — OMEPRAZOLE 20 MG PO CPDR: 20.0000 mg | DELAYED_RELEASE_CAPSULE | Freq: Every day | ORAL | Status: DC

## 2012-10-05 MED ORDER — PANTOPRAZOLE SODIUM 40 MG PO TBEC
40.0000 mg | DELAYED_RELEASE_TABLET | Freq: Every day | ORAL | Status: DC
  Administered 2012-10-05: 40 mg via ORAL
  Filled 2012-10-05: qty 1

## 2012-10-05 NOTE — ED Provider Notes (Signed)
History     CSN: [id number]  Arrival date & time 10/05/12  1350   First MD Initiated Contact with Patient 10/05/12 1634      Chief Complaint  Patient presents with  . Abdominal Pain    (Consider location/radiation/quality/duration/timing/severity/associated sxs/prior treatment) Patient is a 78 y.o. female presenting with general illness. The history is provided by the patient. No language interpreter was used.  Illness  The current episode started 3 to 5 days ago. The problem occurs frequently. The problem has been unchanged. The problem is moderate. Nothing relieves the symptoms. Nothing aggravates the symptoms. Associated symptoms include abdominal pain and nausea. Pertinent negatives include no fever, no congestion, no headaches, no sore throat, no cough and no rash.   No current facility-administered medications on file prior to encounter.   Current Outpatient Prescriptions on File Prior to Encounter  Medication Sig Dispense Refill  . allopurinol (ZYLOPRIM) 100 MG tablet Take 100 mg by mouth daily.        Marland Kitchen amLODipine (NORVASC) 10 MG tablet Take 10 mg by mouth daily.      . carvedilol (COREG) 12.5 MG tablet Take 6.25 mg by mouth 2 (two) times daily with a meal.       . celecoxib (CELEBREX) 200 MG capsule Take 200 mg by mouth daily.        . cetirizine (ZYRTEC) 10 MG tablet Take 10 mg by mouth daily as needed. For allergies      . dexlansoprazole (DEXILANT) 60 MG capsule Take 60 mg by mouth daily as needed. For acid reflux      . glimepiride (AMARYL) 1 MG tablet Take 1 mg by mouth daily before breakfast. Patient takes 1/2 tab in am and 1 tab in the pm      . latanoprost (XALATAN) 0.005 % ophthalmic solution Place 1 drop into both eyes at bedtime.      . Multiple Vitamin (MULTIVITAMIN) capsule Take 1 capsule by mouth daily.        Marland Kitchen spironolactone (ALDACTONE) 25 MG tablet Take 25 mg by mouth daily.       . valsartan (DIOVAN) 160 MG tablet Take 160 mg by mouth daily.      Marland Kitchen  omeprazole (PRILOSEC) 20 MG capsule Take 1 capsule (20 mg total) by mouth daily.  30 capsule  0  . PRECISION XTRA TEST STRIPS test strip As directed       Allergies  Allergen Reactions  . Aspirin   . Diltiazem Hcl   . Hydrocodone-Acetaminophen   . Morphine   . Penicillins     Past Medical History  Diagnosis Date  . 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 HYPERTENSION 02/24/2008  . CAROTID BRUIT 02/24/2008  . DIABETES MELLITUS, TYPE II 02/24/2008  . Benign paroxysmal positional vertigo 04/06/2010    Past Surgical History  Procedure Date  . Pacemaker insertion     No family history on file.  History  Substance Use Topics  . Smoking status: Never Smoker   . Smokeless tobacco: Not on file  . Alcohol Use: No    OB History    Grav Para Term Preterm Abortions TAB SAB Ect Mult Living                  Review of Systems  Constitutional: Negative for fever and chills.  HENT: Negative for congestion and sore throat.   Respiratory: Negative for cough and shortness of breath.   Gastrointestinal: Positive for nausea, abdominal pain  and abdominal distention.  Genitourinary: Negative for dysuria and flank pain.  Musculoskeletal: Positive for back pain. Negative for gait problem.  Skin: Negative for color change and rash.  Neurological: Negative for dizziness and headaches.  Psychiatric/Behavioral: Negative for confusion and agitation.  All other systems reviewed and are negative.    Allergies  Aspirin; Diltiazem hcl; Hydrocodone-acetaminophen; Morphine; and Penicillins  Home Medications   Current Outpatient Rx  Name  Route  Sig  Dispense  Refill  . ALLOPURINOL 100 MG PO TABS   Oral   Take 100 mg by mouth daily.           Marland Kitchen AMLODIPINE BESYLATE 10 MG PO TABS   Oral   Take 10 mg by mouth daily.         . ASPIRIN EC 81 MG PO TBEC   Oral   Take 81 mg by mouth daily.         Marland Kitchen CARVEDILOL 12.5 MG PO TABS   Oral   Take 6.25 mg by mouth 2 (two) times daily with a  meal.          . CELECOXIB 200 MG PO CAPS   Oral   Take 200 mg by mouth daily.           Marland Kitchen CETIRIZINE HCL 10 MG PO TABS   Oral   Take 10 mg by mouth daily as needed. For allergies         . DEXLANSOPRAZOLE 60 MG PO CPDR   Oral   Take 60 mg by mouth daily as needed. For acid reflux         . GLIMEPIRIDE 1 MG PO TABS   Oral   Take 1 mg by mouth daily before breakfast. Patient takes 1/2 tab in am and 1 tab in the pm         . LATANOPROST 0.005 % OP SOLN   Both Eyes   Place 1 drop into both eyes at bedtime.         . MULTIVITAMINS PO CAPS   Oral   Take 1 capsule by mouth daily.           Marland Kitchen PREDNISONE (PAK) 10 MG PO TABS   Oral   Take 10 mg by mouth as directed.         Marland Kitchen SPIRONOLACTONE 25 MG PO TABS   Oral   Take 25 mg by mouth daily.          Marland Kitchen VALSARTAN 160 MG PO TABS   Oral   Take 160 mg by mouth daily.         Marland Kitchen PRECISION XTRA BLOOD GLUCOSE VI STRP      As directed           BP 166/77  Pulse 80  Temp 97.6 F (36.4 C) (Oral)  Resp 16  SpO2 95%  Physical Exam  Constitutional: She is oriented to person, place, and time. She appears well-developed and well-nourished. She does not appear ill.  HENT:  Head: Normocephalic and atraumatic.  Eyes: EOM are normal. Pupils are equal, round, and reactive to light.  Neck: Normal range of motion. Neck supple.  Cardiovascular: Normal rate and regular rhythm.   Pulmonary/Chest: Effort normal and breath sounds normal.  Abdominal: Normal appearance and bowel sounds are normal. She exhibits no shifting dullness. There is no hepatosplenomegaly. There is tenderness in the epigastric area. There is no rigidity, no rebound, no guarding, no CVA tenderness, no tenderness at McBurney's  point and negative Murphy's sign.  Neurological: She is alert and oriented to person, place, and time. GCS eye subscore is 4. GCS verbal subscore is 5. GCS motor subscore is 6.  Skin: Skin is warm and dry.  Psychiatric: She has a  normal mood and affect. Her speech is normal.    ED Course  Procedures (including critical care time)   Labs Reviewed  TROPONIN I  LACTIC ACID, PLASMA  COMPREHENSIVE METABOLIC PANEL  CBC WITH DIFFERENTIAL  LIPASE, BLOOD  URINALYSIS, ROUTINE W REFLEX MICROSCOPIC   Results for orders placed during the hospital encounter of 10/05/12  TROPONIN I      Component Value Range   Troponin I <0.30  <0.30 ng/mL  LACTIC ACID, PLASMA      Component Value Range   Lactic Acid, Venous 0.9  0.5 - 2.2 mmol/L  COMPREHENSIVE METABOLIC PANEL      Component Value Range   Sodium 137  135 - 145 mEq/L   Potassium 4.2  3.5 - 5.1 mEq/L   Chloride 98  96 - 112 mEq/L   CO2 27  19 - 32 mEq/L   Glucose, Bld 107 (*) 70 - 99 mg/dL   BUN 35 (*) 6 - 23 mg/dL   Creatinine, Ser 1.22 (*) 0.50 - 1.10 mg/dL   Calcium 9.5  8.4 - 10.5 mg/dL   Total Protein 6.8  6.0 - 8.3 g/dL   Albumin 4.0  3.5 - 5.2 g/dL   AST 18  0 - 37 U/L   ALT 14  0 - 35 U/L   Alkaline Phosphatase 45  39 - 117 U/L   Total Bilirubin 0.6  0.3 - 1.2 mg/dL   GFR calc non Af Amer 41 (*) >90 mL/min   GFR calc Af Amer 47 (*) >90 mL/min  CBC WITH DIFFERENTIAL      Component Value Range   WBC 10.9 (*) 4.0 - 10.5 K/uL   RBC 5.07  3.87 - 5.11 MIL/uL   Hemoglobin 13.6  12.0 - 15.0 g/dL   HCT 41.1  36.0 - 46.0 %   MCV 81.1  78.0 - 100.0 fL   MCH 26.8  26.0 - 34.0 pg   MCHC 33.1  30.0 - 36.0 g/dL   RDW 15.4  11.5 - 15.5 %   Platelets 257  150 - 400 K/uL   Neutrophils Relative 64  43 - 77 %   Neutro Abs 6.9  1.7 - 7.7 K/uL   Lymphocytes Relative 27  12 - 46 %   Lymphs Abs 2.9  0.7 - 4.0 K/uL   Monocytes Relative 9  3 - 12 %   Monocytes Absolute 0.9  0.1 - 1.0 K/uL   Eosinophils Relative 1  0 - 5 %   Eosinophils Absolute 0.1  0.0 - 0.7 K/uL   Basophils Relative 0  0 - 1 %   Basophils Absolute 0.0  0.0 - 0.1 K/uL  LIPASE, BLOOD      Component Value Range   Lipase 43  11 - 59 U/L  URINALYSIS, ROUTINE W REFLEX MICROSCOPIC      Component  Value Range   Color, Urine YELLOW  YELLOW   APPearance CLEAR  CLEAR   Specific Gravity, Urine 1.019  1.005 - 1.030   pH 6.5  5.0 - 8.0   Glucose, UA NEGATIVE  NEGATIVE mg/dL   Hgb urine dipstick NEGATIVE  NEGATIVE   Bilirubin Urine NEGATIVE  NEGATIVE   Ketones, ur NEGATIVE  NEGATIVE mg/dL  Protein, ur NEGATIVE  NEGATIVE mg/dL   Urobilinogen, UA 1.0  0.0 - 1.0 mg/dL   Nitrite NEGATIVE  NEGATIVE   Leukocytes, UA SMALL (*) NEGATIVE  URINE MICROSCOPIC-ADD ON      Component Value Range   WBC, UA 0-2  <3 WBC/hpf   RBC / HPF 0-2  <3 RBC/hpf   DG Abd Acute W/Chest (Final result)   Result time:10/05/12 1837    Final result by Rad Results In Interface (10/05/12 18:37:34)    Narrative:   *RADIOLOGY REPORT*  Clinical Data: Upper abdominal pain, abdominal bloating  ACUTE ABDOMEN SERIES (ABDOMEN 2 VIEW & CHEST 1 VIEW)  Comparison: 09/25/2012  Findings: Cardiomediastinal silhouette is stable. Dual lead cardiac pacemaker is unchanged in position. Degenerative changes bilateral shoulders.  There is nonspecific nonobstructive bowel gas pattern. Moderate colonic stool. Postsurgical changes are noted lower lumbar spine. No free abdominal air.  IMPRESSION: No acute disease. Nonspecific nonobstructive bowel gas pattern. Moderate colonic stool.   Original Report Authenticated By: Lahoma Crocker, M.D.              EKG Results     No results found.   No diagnosis found.    MDM  Pt w/ pmhx of DM now w/ epigastric bloating. Has been on prednisone 12 day course for strained muscle. States several day hx of bloating and early satiety, epigastric pain and nausea. Denies vomiting. No hx of diabetic gastroparesis. Long hx of NSAIDS, no ETOH or tobacco. Admits to constipation, no urinary sx. No fever.   Vitals unremarkable, abd soft and benign. Mild ttp epigastric. No clinical peritonitis. No rebound/guarding. No CVA tenderness.   Plan: likely side effect of prednisone. Possible  PUD/gastritis. Possible atypical ACS. Will check cmp, lipase, lactic acid, cbc, u/a, ecg, troponin, acute abd series. Will give gi cocktail and prilosec  Reassessed, vitals stable, NAD, pain well controlled, AXR c/w constipation, wbc 10.9 - likely 2/2 prednisone. Lactic acid and lipase normal, troponin neg, cmp and u/a unremarkable. Sx likely benign etiology likely 2/2 prednisone. Pt states her back strain has improved - at this time feel it is prudent to d/c prednisone, increase her laxative, give rx for prilosec. Follow up w/ pcp on Monday. Given return precautions. Stable for d/c home.   1. Postprandial abdominal bloating   2. Abdominal  pain, other specified site    New Prescriptions   OMEPRAZOLE (PRILOSEC) 20 MG CAPSULE    Take 1 capsule (20 mg total) by mouth daily.   Foye Spurling, MD [address], [place]  [address] [phone]  Schedule an appointment as soon as possible for a visit on 10/07/2012  [place] [address] [id number] mc [place] Manuel [place]           Ernst Spell, MD 10/06/12 [phone]

## 2012-10-05 NOTE — ED Notes (Signed)
Sees Dro [name] yesterday cause I had blurred vision 2 days ago.

## 2012-10-05 NOTE — ED Notes (Signed)
Pt. Stated, I've been on the Prednisone dose pak for 12 days and having some bloating and bubleie and a lot of gas.  So I'm not sure about what's going on with my stomach.

## 2012-10-06 NOTE — ED Provider Notes (Signed)
I saw and evaluated the patient, reviewed the resident's note and I agree with the findings and plan.   [name] Rice, MD 10/06/12 [phone]

## 2012-10-28 ENCOUNTER — Other Ambulatory Visit: Payer: Self-pay | Admitting: Internal Medicine

## 2012-10-28 DIAGNOSIS — M549 Dorsalgia, unspecified: Secondary | ICD-10-CM

## 2012-10-28 DIAGNOSIS — R0789 Other chest pain: Secondary | ICD-10-CM

## 2012-10-31 ENCOUNTER — Encounter (HOSPITAL_COMMUNITY)
Admission: RE | Admit: 2012-10-31 | Discharge: 2012-10-31 | Disposition: A | Discharge status: Home / Self Care | Payer: Medicare Other | Source: Ambulatory Visit | Attending: Internal Medicine | Admitting: Internal Medicine

## 2012-10-31 DIAGNOSIS — R0789 Other chest pain: Secondary | ICD-10-CM

## 2012-10-31 DIAGNOSIS — M546 Pain in thoracic spine: Secondary | ICD-10-CM | POA: Insufficient documentation

## 2012-10-31 DIAGNOSIS — M549 Dorsalgia, unspecified: Secondary | ICD-10-CM

## 2012-10-31 DIAGNOSIS — R079 Chest pain, unspecified: Secondary | ICD-10-CM | POA: Insufficient documentation

## 2012-10-31 MED ORDER — TECHNETIUM TC 99M MEDRONATE IV KIT
25.0000 | PACK | Freq: Once | INTRAVENOUS | Status: AC | PRN
  Administered 2012-10-31: 25 via INTRAVENOUS

## 2012-12-10 ENCOUNTER — Encounter: Payer: Self-pay | Admitting: *Deleted

## 2013-01-09 ENCOUNTER — Encounter: Payer: Medicare Other | Admitting: Cardiology

## 2013-01-14 ENCOUNTER — Encounter: Payer: Medicare Other | Admitting: Cardiology

## 2013-01-30 ENCOUNTER — Ambulatory Visit (INDEPENDENT_AMBULATORY_CARE_PROVIDER_SITE_OTHER): Payer: Medicare Other | Admitting: Cardiology

## 2013-01-30 ENCOUNTER — Encounter: Payer: Self-pay | Admitting: Cardiology

## 2013-01-30 ENCOUNTER — Encounter: Payer: Self-pay | Admitting: Internal Medicine

## 2013-01-30 VITALS — BP 120/56 | HR 71 | Ht 65.0 in | Wt 186.0 lb

## 2013-01-30 DIAGNOSIS — R001 Bradycardia, unspecified: Secondary | ICD-10-CM

## 2013-01-30 DIAGNOSIS — I498 Other specified cardiac arrhythmias: Secondary | ICD-10-CM

## 2013-01-30 DIAGNOSIS — Z950 Presence of cardiac pacemaker: Secondary | ICD-10-CM

## 2013-01-30 LAB — PACEMAKER DEVICE OBSERVATION
AL AMPLITUDE: 3.5 mv
AL IMPEDENCE PM: 390 Ohm
AL THRESHOLD: 0.75 V
ATRIAL PACING PM: 100
BAMS-0001: 150 {beats}/min
BATTERY VOLTAGE: 2.89 V
DEVICE MODEL PM: 2284731
RV LEAD AMPLITUDE: 10.6 mv
RV LEAD IMPEDENCE PM: 460 Ohm
RV LEAD THRESHOLD: 1 V
VENTRICULAR PACING PM: 44

## 2013-01-30 NOTE — Progress Notes (Signed)
ELECTROPHYSIOLOGY OFFICE NOTE  Patient ID: [name] MRN: [id number], DOB/AGE: September 08, 1930   Date of Visit: 01/30/2013  Primary Physician: Foye Spurling, MD Primary Cardiologist: Rayann Heman, MD Reason for Visit: EP/device follow-up  History of Present Illness  [name] is a pleasant 79 year old woman with symptomatic bradycardia s/p PPM implant, HTN and DM who presents today for routine electrophysiology followup. She is accompanied by her husband. Since last being seen in our clinic, she reports she is doing well. She has no complaints. Today, she denies chest pain or shortness of breath. She denies palpitations, dizziness, near syncope or syncope. She denies LE swelling, orthopnea, PND or recent weight gain. [name] reports that she is compliant and tolerating medications without difficulty.  Past Medical History Past Medical History  Diagnosis Date  . 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 HYPERTENSION 02/24/2008  . CAROTID BRUIT 02/24/2008  . DIABETES MELLITUS, TYPE II 02/24/2008  . Benign paroxysmal positional vertigo 04/06/2010    Past Surgical History Past Surgical History  Procedure Laterality Date  . Pacemaker insertion      Allergies/Intolerances Allergies  Allergen Reactions  . Aspirin   . Diltiazem Hcl   . Hydrocodone-Acetaminophen   . Morphine   . Penicillins    Current Home Medications Current Outpatient Prescriptions  Medication Sig Dispense Refill  . allopurinol (ZYLOPRIM) 100 MG tablet Take 100 mg by mouth daily.        Marland Kitchen amLODipine (NORVASC) 10 MG tablet Take 10 mg by mouth daily.      Marland Kitchen aspirin EC 81 MG tablet Take 81 mg by mouth daily.      . carvedilol (COREG) 12.5 MG tablet Take 6.25 mg by mouth 2 (two) times daily with a meal.       . celecoxib (CELEBREX) 200 MG capsule Take 200 mg by mouth daily.        . cetirizine (ZYRTEC) 10 MG tablet Take 10 mg by mouth daily as needed. For allergies      . dexlansoprazole (DEXILANT) 60 MG capsule  Take 60 mg by mouth daily as needed. For acid reflux      . glimepiride (AMARYL) 1 MG tablet Take 1 mg by mouth daily before breakfast. Patient takes 1/2 tab in am and 1 tab in the pm      . latanoprost (XALATAN) 0.005 % ophthalmic solution Place 1 drop into both eyes at bedtime.      . meclizine (ANTIVERT) 25 MG tablet as needed.      . Multiple Vitamin (MULTIVITAMIN) capsule Take 1 capsule by mouth daily.        Marland Kitchen PRECISION XTRA TEST STRIPS test strip As directed      . spironolactone (ALDACTONE) 25 MG tablet Take 25 mg by mouth daily.       . valsartan (DIOVAN) 160 MG tablet Take 160 mg by mouth daily.       No current facility-administered medications for this visit.   Social History Social History  . Marital Status: Married   Social History Main Topics  . Smoking status: Never Smoker   . Smokeless tobacco: No  . Alcohol Use: No  . Drug Use: No   Review of Systems General: No chills, fever, night sweats or weight changes Cardiovascular: No chest pain, dyspnea on exertion, edema, orthopnea, palpitations, paroxysmal nocturnal dyspnea Dermatological: No rash, lesions or masses Respiratory: No cough, dyspnea Urologic: No hematuria, dysuria Abdominal: No nausea, vomiting, diarrhea, bright red blood  per rectum, melena, or hematemesis Neurologic: No visual changes, weakness, changes in mental status All other systems reviewed and are otherwise negative except as noted above.  Physical Exam Blood pressure 120/56, pulse 71, height 5\' 5"  (1.651 m), weight 186 lb (84.369 kg), SpO2 98.00%.  General: Well developed, well appearing 79 year old female in no acute distress. HEENT: Normocephalic, atraumatic. EOMs intact. Sclera nonicteric. Oropharynx clear.  Neck: Supple. No JVD. Lungs: Respirations regular and unlabored, CTA bilaterally. No wheezes, rales or rhonchi. Heart: RRR. S1, S2 present. No murmurs, rub, S3 or S4. Abdomen: Soft, non-distended.  Extremities: No clubbing, cyanosis or  edema. PT/Radials 2+ and equal bilaterally. Psych: Normal affect. Neuro: Alert and oriented X 3. Moves all extremities spontaneously.   Diagnostics Device interrogation today - Normal device function. Thresholds, sensing, impedances consistent with previous measurements. Device programmed to maximize longevity. 20 AHR episodes, EGMs reviewed which show freq PACs and PAT. AT/AF burden <1%. No high ventricular rates noted. Device programmed at appropriate safety margins. Histogram distribution appropriate for patient activity level. Device programmed to optimize intrinsic conduction. Estimated longevity 4 - 4.6 years.  Assessment and Plan 1. Symptomatic bradycardia s/p PPM implant Normal device function No programming changes made Continue routine remote device follow-up/checks every 3 months Return to clinic for follow-up with Dr. Rayann Heman in one year 2. HTN Stable; normotensive today Continue current antihypertensive regimen  Signed, Ileene Hutchinson, PA-C 01/30/2013, 3:24 PM

## 2013-01-30 NOTE — Patient Instructions (Addendum)
Remote monitoring is used to monitor your Pacemaker of ICD from home. This monitoring reduces the number of office visits required to check your device to one time per year. It allows [place] to keep an eye on the functioning of your device to ensure it is working properly. You are scheduled for a device check from home on 05/05/13. You may send your transmission at any time that day. If you have a wireless device, the transmission will be sent automatically. After your physician reviews your transmission, you will receive a postcard with your next transmission date.  Your physician wants you to follow-up in: 1 year with Dr Rayann Heman. You will receive a reminder letter in the mail two months in advance. If you don't receive a letter, please call our office to schedule the follow-up appointment.

## 2013-02-04 ENCOUNTER — Encounter: Payer: Self-pay | Admitting: Cardiology

## 2013-05-05 ENCOUNTER — Encounter: Payer: Medicare Other | Admitting: *Deleted

## 2013-05-06 ENCOUNTER — Encounter: Payer: Self-pay | Admitting: *Deleted

## 2013-05-12 ENCOUNTER — Encounter: Payer: Self-pay | Admitting: Internal Medicine

## 2013-05-12 ENCOUNTER — Ambulatory Visit (INDEPENDENT_AMBULATORY_CARE_PROVIDER_SITE_OTHER): Payer: Medicare Other | Admitting: *Deleted

## 2013-05-12 DIAGNOSIS — I498 Other specified cardiac arrhythmias: Secondary | ICD-10-CM

## 2013-05-12 DIAGNOSIS — Z950 Presence of cardiac pacemaker: Secondary | ICD-10-CM

## 2013-05-14 LAB — REMOTE PACEMAKER DEVICE
AL AMPLITUDE: 3.8 mv
AL IMPEDENCE PM: 350 Ohm
ATRIAL PACING PM: 99
BAMS-0001: 150 {beats}/min
BATTERY VOLTAGE: 2.87 V
BRDY-0002RV: 70 {beats}/min
BRDY-0004RV: 105 {beats}/min
DEVICE MODEL PM: 2284731
RV LEAD AMPLITUDE: 9.2 mv
RV LEAD IMPEDENCE PM: 440 Ohm
RV LEAD THRESHOLD: 1.125 V
VENTRICULAR PACING PM: 55

## 2013-05-28 ENCOUNTER — Encounter: Payer: Self-pay | Admitting: *Deleted

## 2013-08-11 ENCOUNTER — Encounter: Payer: Medicare Other | Admitting: *Deleted

## 2013-08-12 ENCOUNTER — Encounter: Payer: Self-pay | Admitting: *Deleted

## 2013-08-22 ENCOUNTER — Ambulatory Visit (INDEPENDENT_AMBULATORY_CARE_PROVIDER_SITE_OTHER): Payer: Medicare Other

## 2013-08-22 ENCOUNTER — Encounter: Payer: Self-pay | Admitting: Internal Medicine

## 2013-08-22 DIAGNOSIS — I498 Other specified cardiac arrhythmias: Secondary | ICD-10-CM

## 2013-08-22 LAB — MDC_IDC_ENUM_SESS_TYPE_REMOTE
Battery Remaining Longevity: 33 mo
Battery Voltage: 2.84 V
Brady Statistic AP VP Percent: 56 %
Brady Statistic AP VS Percent: 44 %
Brady Statistic AS VP Percent: 1 %
Brady Statistic AS VS Percent: 1 %
Brady Statistic RA Percent Paced: 99 %
Brady Statistic RV Percent Paced: 56 %
Date Time Interrogation Session: 20141107174934
Implantable Pulse Generator Model: 2210
Implantable Pulse Generator Serial Number: 2284731
Lead Channel Impedance Value: 350 Ohm
Lead Channel Impedance Value: 530 Ohm
Lead Channel Pacing Threshold Amplitude: 0.75 V
Lead Channel Pacing Threshold Amplitude: 1.125 V
Lead Channel Pacing Threshold Pulse Width: 0.4 ms
Lead Channel Pacing Threshold Pulse Width: 0.4 ms
Lead Channel Sensing Intrinsic Amplitude: 3.9 mV
Lead Channel Sensing Intrinsic Amplitude: 9.6 mV
Lead Channel Setting Pacing Amplitude: 1.375
Lead Channel Setting Pacing Amplitude: 2 V
Lead Channel Setting Pacing Pulse Width: 0.4 ms
Lead Channel Setting Sensing Sensitivity: 2 mV

## 2013-09-03 ENCOUNTER — Encounter: Payer: Self-pay | Admitting: *Deleted

## 2013-11-24 ENCOUNTER — Ambulatory Visit (INDEPENDENT_AMBULATORY_CARE_PROVIDER_SITE_OTHER): Payer: Medicare Other | Admitting: *Deleted

## 2013-11-24 ENCOUNTER — Encounter: Payer: Self-pay | Admitting: Internal Medicine

## 2013-11-24 DIAGNOSIS — I498 Other specified cardiac arrhythmias: Secondary | ICD-10-CM

## 2013-11-24 LAB — MDC_IDC_ENUM_SESS_TYPE_REMOTE
Battery Remaining Longevity: 34 mo
Battery Voltage: 2.84 V
Brady Statistic AP VP Percent: 59 %
Brady Statistic AP VS Percent: 41 %
Brady Statistic AS VP Percent: 1 %
Brady Statistic AS VS Percent: 1 %
Brady Statistic RA Percent Paced: 99 %
Brady Statistic RV Percent Paced: 59 %
Date Time Interrogation Session: 20150209153103
Implantable Pulse Generator Model: 2210
Implantable Pulse Generator Serial Number: 2284731
Lead Channel Impedance Value: 360 Ohm
Lead Channel Impedance Value: 480 Ohm
Lead Channel Pacing Threshold Amplitude: 0.75 V
Lead Channel Pacing Threshold Amplitude: 1.125 V
Lead Channel Pacing Threshold Pulse Width: 0.4 ms
Lead Channel Pacing Threshold Pulse Width: 0.4 ms
Lead Channel Sensing Intrinsic Amplitude: 12 mV
Lead Channel Sensing Intrinsic Amplitude: 3.5 mV
Lead Channel Setting Pacing Amplitude: 1.375
Lead Channel Setting Pacing Amplitude: 2 V
Lead Channel Setting Pacing Pulse Width: 0.4 ms
Lead Channel Setting Sensing Sensitivity: 2 mV

## 2013-12-03 ENCOUNTER — Encounter: Payer: Self-pay | Admitting: *Deleted

## 2014-01-30 ENCOUNTER — Encounter: Payer: Self-pay | Admitting: Internal Medicine

## 2014-01-30 ENCOUNTER — Ambulatory Visit (INDEPENDENT_AMBULATORY_CARE_PROVIDER_SITE_OTHER): Payer: Medicare Other | Admitting: Internal Medicine

## 2014-01-30 VITALS — BP 122/67 | HR 67 | Ht 65.0 in | Wt 192.0 lb

## 2014-01-30 DIAGNOSIS — I498 Other specified cardiac arrhythmias: Secondary | ICD-10-CM

## 2014-01-30 DIAGNOSIS — R0602 Shortness of breath: Secondary | ICD-10-CM

## 2014-01-30 DIAGNOSIS — R5381 Other malaise: Secondary | ICD-10-CM

## 2014-01-30 DIAGNOSIS — R5383 Other fatigue: Secondary | ICD-10-CM

## 2014-01-30 DIAGNOSIS — R079 Chest pain, unspecified: Secondary | ICD-10-CM

## 2014-01-30 DIAGNOSIS — I10 Essential (primary) hypertension: Secondary | ICD-10-CM

## 2014-01-30 LAB — MDC_IDC_ENUM_SESS_TYPE_INCLINIC
Battery Remaining Longevity: 30 mo
Battery Voltage: 2.84 V
Brady Statistic RA Percent Paced: 99.68 %
Brady Statistic RV Percent Paced: 61 %
Date Time Interrogation Session: 20150417121521
Implantable Pulse Generator Model: 2210
Implantable Pulse Generator Serial Number: 2284731
Lead Channel Impedance Value: 375 Ohm
Lead Channel Impedance Value: 475 Ohm
Lead Channel Pacing Threshold Amplitude: 0.75 V
Lead Channel Pacing Threshold Amplitude: 1.25 V
Lead Channel Pacing Threshold Pulse Width: 0.4 ms
Lead Channel Pacing Threshold Pulse Width: 0.4 ms
Lead Channel Sensing Intrinsic Amplitude: 2.6 mV
Lead Channel Sensing Intrinsic Amplitude: 8.6 mV
Lead Channel Setting Pacing Amplitude: 2 V
Lead Channel Setting Pacing Amplitude: 2.5 V
Lead Channel Setting Pacing Pulse Width: 0.4 ms
Lead Channel Setting Sensing Sensitivity: 2 mV

## 2014-01-30 NOTE — Patient Instructions (Addendum)
Your physician wants you to follow-up in: 12 months with Dr Vallery Ridge will receive a reminder letter in the mail two months in advance. If you don't receive a letter, please call our office to schedule the follow-up appointment.  Remote monitoring is used to monitor your Pacemaker or ICD from home. This monitoring reduces the number of office visits required to check your device to one time per year. It allows [place] to keep an eye on the functioning of your device to ensure it is working properly. You are scheduled for a device check from home on 05/04/14. You may send your transmission at any time that day. If you have a wireless device, the transmission will be sent automatically. After your physician reviews your transmission, you will receive a postcard with your next transmission date.   Your physician has recommended you make the following change in your medication:  1) Stop Carvedilol  Your physician has requested that you have an echocardiogram. Echocardiography is a painless test that uses sound waves to create images of your heart. It provides your doctor with information about the size and shape of your heart and how well your heart's chambers and valves are working. This procedure takes approximately one hour. There are no restrictions for this procedure.  Your physician has requested that you have a lexiscan myoview. For further information please visit [email or web address]. Please follow instruction sheet, as given.

## 2014-02-01 DIAGNOSIS — R0602 Shortness of breath: Secondary | ICD-10-CM | POA: Insufficient documentation

## 2014-02-01 DIAGNOSIS — R5383 Other fatigue: Secondary | ICD-10-CM | POA: Insufficient documentation

## 2014-02-01 NOTE — Progress Notes (Signed)
ELECTROPHYSIOLOGY OFFICE NOTE  Patient ID: [name] MRN: [id number], DOB/AGE: 22-Mar-1930   Date of Visit: 02/01/2014  Primary Physician: Foye Spurling, MD Primary Cardiologist: Rayann Heman, MD Reason for Visit: EP/device follow-up  History of Present Illness  [name] is a pleasant 80 year old woman with symptomatic bradycardia s/p PPM implant, HTN and DM who presents today for routine electrophysiology followup.  She reports symptoms of fatigue.  She has recently developed progressive SOB with moderate activity.  She reports having occasional sharp chest pain at rest.  This is short lived. Today, she denies palpitations, dizziness, near syncope or syncope. She denies LE swelling, orthopnea, PND or recent weight gain. Ms. Byl reports that she is compliant and tolerating medications without difficulty.  Past Medical History Past Medical History  Diagnosis Date  . 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 HYPERTENSION 02/24/2008  . CAROTID BRUIT 02/24/2008  . DIABETES MELLITUS, TYPE II 02/24/2008  . Benign paroxysmal positional vertigo 04/06/2010    Past Surgical History Past Surgical History  Procedure Laterality Date  . Pacemaker insertion      Allergies/Intolerances Allergies  Allergen Reactions  . Aspirin   . Diltiazem Hcl   . Hydrocodone-Acetaminophen   . Morphine   . Penicillins    Current Home Medications Current Outpatient Prescriptions  Medication Sig Dispense Refill  . allopurinol (ZYLOPRIM) 100 MG tablet Take 100 mg by mouth daily.        Marland Kitchen amLODipine (NORVASC) 10 MG tablet Take 10 mg by mouth daily.      . cetirizine (ZYRTEC) 10 MG tablet Take 10 mg by mouth daily. For allergies      . dexlansoprazole (DEXILANT) 60 MG capsule Take 60 mg by mouth daily as needed. For acid reflux      . glimepiride (AMARYL) 1 MG tablet Take 1/2 tablet in the morning and 1 tablet at night      . glucosamine-chondroitin 500-400 MG tablet Take 1 tablet by mouth daily.      Marland Kitchen  latanoprost (XALATAN) 0.005 % ophthalmic solution Place 1 drop into both eyes at bedtime.      . meclizine (ANTIVERT) 25 MG tablet Take 25 mg by mouth as needed.       . Multiple Vitamin (MULTIVITAMIN) capsule Take 1 capsule by mouth daily.        Marland Kitchen olmesartan-hydrochlorothiazide (BENICAR HCT) 20-12.5 MG per tablet Take 1 tablet by mouth daily.      Marland Kitchen spironolactone (ALDACTONE) 25 MG tablet Take 25 mg by mouth daily.        No current facility-administered medications for this visit.   Social History Social History  . Marital Status: Married   Social History Main Topics  . Smoking status: Never Smoker   . Smokeless tobacco: No  . Alcohol Use: No  . Drug Use: No    Physical Exam Blood pressure 122/67, pulse 67, height 5\' 5"  (1.651 m), weight 192 lb (87.091 kg).  General: Well developed, well appearing 80 year old female in no acute distress. HEENT: Normocephalic, atraumatic. EOMs intact. Sclera nonicteric. Oropharynx clear.  Neck: Supple. No JVD. Lungs: Respirations regular and unlabored, CTA bilaterally. No wheezes, rales or rhonchi. Heart: RRR. S1, S2 present. No murmurs, rub, S3 or S4. Abdomen: Soft, non-distended.  Extremities: No clubbing, cyanosis or edema. PT/Radials 2+ and equal bilaterally. Psych: Normal affect. Neuro: Alert and oriented X 3. Moves all extremities spontaneously.   Device interrogation today is reviewed- see paceart  Assessment  and Plan 1. Symptomatic sinus bradycardia s/p PPM implant Normal device function No programming changes made  2. HTN Stable No change required today  3. SOB/ chest pain Echo and lexiscan myoview are ordered today Given fatigue, I will stop coreg  Thompson Grayer MD 02/01/2014, 8:24 PM

## 2014-02-18 ENCOUNTER — Encounter: Payer: Self-pay | Admitting: Cardiology

## 2014-02-18 ENCOUNTER — Ambulatory Visit (HOSPITAL_BASED_OUTPATIENT_CLINIC_OR_DEPARTMENT_OTHER): Payer: Medicare Other | Admitting: Radiology

## 2014-02-18 ENCOUNTER — Ambulatory Visit (HOSPITAL_COMMUNITY): Payer: Medicare Other | Attending: Cardiology | Admitting: Radiology

## 2014-02-18 VITALS — BP 148/75 | Ht 65.0 in | Wt 186.0 lb

## 2014-02-18 DIAGNOSIS — R5383 Other fatigue: Secondary | ICD-10-CM | POA: Insufficient documentation

## 2014-02-18 DIAGNOSIS — R5381 Other malaise: Secondary | ICD-10-CM | POA: Insufficient documentation

## 2014-02-18 DIAGNOSIS — R0602 Shortness of breath: Secondary | ICD-10-CM

## 2014-02-18 DIAGNOSIS — R0609 Other forms of dyspnea: Secondary | ICD-10-CM

## 2014-02-18 DIAGNOSIS — R0989 Other specified symptoms and signs involving the circulatory and respiratory systems: Secondary | ICD-10-CM | POA: Insufficient documentation

## 2014-02-18 DIAGNOSIS — R079 Chest pain, unspecified: Secondary | ICD-10-CM | POA: Insufficient documentation

## 2014-02-18 DIAGNOSIS — R072 Precordial pain: Secondary | ICD-10-CM

## 2014-02-18 MED ORDER — ADENOSINE (DIAGNOSTIC) 3 MG/ML IV SOLN
0.5600 mg/kg | Freq: Once | INTRAVENOUS | Status: AC
  Administered 2014-02-18: 47.4 mg via INTRAVENOUS

## 2014-02-18 MED ORDER — TECHNETIUM TC 99M SESTAMIBI GENERIC - CARDIOLITE
30.0000 | Freq: Once | INTRAVENOUS | Status: AC | PRN
  Administered 2014-02-18: 30 via INTRAVENOUS

## 2014-02-18 MED ORDER — TECHNETIUM TC 99M SESTAMIBI GENERIC - CARDIOLITE
10.0000 | Freq: Once | INTRAVENOUS | Status: AC | PRN
  Administered 2014-02-18: 10 via INTRAVENOUS

## 2014-02-18 NOTE — Progress Notes (Signed)
[facility] [address] [place], [place] [address] [phone]    Cardiology Nuclear Med Study  [name] is a 80 y.o. female     MRN : [id number]     DOB: 08/09/1930  Procedure Date: 02/18/2014  Nuclear Med Background Indication for Stress Test:  Evaluation for Ischemia History:  2011 ECHO: EF: 55-60% MPI: 10 yrs ago: ok per pt Cardiac Risk Factors: Family History - CAD, Hypertension, Lipids and NIDDM  Symptoms:  Chest Pain, DOE and Fatigue   Nuclear Pre-Procedure Caffeine/Decaff Intake:  None > 12 hrs NPO After: 8:30pm   Lungs:  clear O2 Sat: 99% on room air. IV 0.9% NS with Angio Cath:  20g  IV Site: R Antecubital x 1, tolerated well IV Started by:  Irven Baltimore, RN  Chest Size (in):  42 Cup Size: DD  Height: 5\' 5"  (1.651 m)  Weight:  186 lb (84.369 kg)  BMI:  Body mass index is 30.95 kg/(m^2). Tech Comments:  No medications (Amaryl) this am . Irven Baltimore, RN.    Nuclear Med Study 1 or 2 day study: 1 day  Stress Test Type:  Adenosine  Reading MD: N/A  Order Authorizing Provider:  Thompson Grayer, MD  Resting Radionuclide: Technetium 48m Sestamibi  Resting Radionuclide Dose: 11.0 mCi   Stress Radionuclide:  Technetium 27m Sestamibi  Stress Radionuclide Dose: 33.0 mCi           Stress Protocol Rest HR: 70 Stress HR: 73  Rest BP: 148/75 Stress BP: 115/47  Exercise Time (min): n/a METS: n/a   Predicted Max HR: 139 bpm % Max HR: 52.52 bpm Rate Pressure Product: 10804   Dose of Adenosine (mg):  47.3 Dose of Lexiscan: n/a mg  Dose of Atropine (mg): n/a Dose of Dobutamine: n/a mcg/kg/min (at max HR)  Stress Test Technologist: Perrin Maltese, EMT-P  Nuclear Technologist:  Annye Rusk, CNMT     Rest Procedure:  Myocardial perfusion imaging was performed at rest 45 minutes following the intravenous administration of Technetium 68m Sestamibi. Rest ECG: Atrial paced with normal ventricular conduction  Stress Procedure:  The  patient received IV adenosine at 140 mcg/kg/min for 4 minutes.  Technetium 41m Sestamibi was injected at the 2 minute mark and quantitative spect images were obtained after a 45 minute delay. Stress ECG: No significant change from baseline ECG  QPS Raw Data Images:  Mild diaphragmatic attenuation.  Normal left ventricular size. Stress Images:  There is a small to medium sized mild defect in the basal and mid inferior and inferolateral walls and apical inferior wall Rest Images:  Comparison with the stress images reveals no significant change. Subtraction (SDS):  There is a fixed inferior defect that is most consistent with diaphragmatic attenuation. Transient Ischemic Dilatation (Normal <1.22):  0.95 Lung/Heart Ratio (Normal <0.45):  0.40  Quantitative Gated Spect Images QGS EDV:  99 ml QGS ESV:  36 ml  Impression Exercise Capacity:  Lexiscan with no exercise. BP Response:  Normal blood pressure response. Clinical Symptoms:  chest pressure and throat tightness ECG Impression:  No significant ST segment change suggestive of ischemia. Comparison with Prior Nuclear Study: No images to compare  Overall Impression:  Low risk stress nuclear study Fixed defec in the mid and basal inferior and inferolateral walls and apical lateral wall consistent with diaphragmatic attenuation.  No evidence of ischemia.+.  LV Ejection Fraction: 64%.  LV Wall Motion:  NL LV Function; NL Wall Motion  Signed: Traci  Radford Pax, MD [place]

## 2014-02-18 NOTE — Progress Notes (Signed)
Echocardiogram performed.  

## 2014-02-20 ENCOUNTER — Telehealth: Payer: Self-pay | Admitting: Internal Medicine

## 2014-02-20 NOTE — Telephone Encounter (Signed)
New message ° ° ° ° ° °Calling to get stress test results °

## 2014-02-20 NOTE — Telephone Encounter (Signed)
Notified of echo results.

## 2014-02-24 ENCOUNTER — Encounter: Payer: Self-pay | Admitting: Internal Medicine

## 2014-05-04 ENCOUNTER — Ambulatory Visit (INDEPENDENT_AMBULATORY_CARE_PROVIDER_SITE_OTHER): Payer: Medicare Other | Admitting: *Deleted

## 2014-05-04 ENCOUNTER — Encounter: Payer: Self-pay | Admitting: Internal Medicine

## 2014-05-04 ENCOUNTER — Telehealth: Payer: Self-pay | Admitting: Cardiology

## 2014-05-04 DIAGNOSIS — I498 Other specified cardiac arrhythmias: Secondary | ICD-10-CM

## 2014-05-04 LAB — MDC_IDC_ENUM_SESS_TYPE_REMOTE
Battery Remaining Longevity: 35 mo
Battery Remaining Percentage: 33 %
Battery Voltage: 2.84 V
Brady Statistic AP VP Percent: 43 %
Brady Statistic AP VS Percent: 56 %
Brady Statistic AS VP Percent: 1 %
Brady Statistic AS VS Percent: 1 %
Brady Statistic RA Percent Paced: 99 %
Brady Statistic RV Percent Paced: 43 %
Date Time Interrogation Session: 20150720223940
Implantable Pulse Generator Model: 2210
Implantable Pulse Generator Serial Number: 2284731
Lead Channel Impedance Value: 430 Ohm
Lead Channel Impedance Value: 560 Ohm
Lead Channel Pacing Threshold Amplitude: 0.75 V
Lead Channel Pacing Threshold Amplitude: 1.25 V
Lead Channel Pacing Threshold Pulse Width: 0.4 ms
Lead Channel Pacing Threshold Pulse Width: 0.4 ms
Lead Channel Sensing Intrinsic Amplitude: 12 mV
Lead Channel Sensing Intrinsic Amplitude: 4.8 mV
Lead Channel Setting Pacing Amplitude: 2 V
Lead Channel Setting Pacing Amplitude: 2.5 V
Lead Channel Setting Pacing Pulse Width: 0.4 ms
Lead Channel Setting Sensing Sensitivity: 2 mV

## 2014-05-04 NOTE — Telephone Encounter (Signed)
Confirmed remote transmission with pt husband.  

## 2014-05-05 NOTE — Progress Notes (Signed)
Remote pacemaker transmission.   

## 2014-05-13 ENCOUNTER — Encounter: Payer: Self-pay | Admitting: Cardiology

## 2014-08-12 ENCOUNTER — Telehealth: Payer: Self-pay | Admitting: Cardiology

## 2014-08-12 ENCOUNTER — Encounter: Payer: Medicare Other | Admitting: *Deleted

## 2014-08-12 NOTE — Telephone Encounter (Signed)
Confirmed remote transmission with pt husband he stated that she was out of town and will be back tomorrow. Pt will send transmission at that time.

## 2014-08-17 ENCOUNTER — Encounter: Payer: Self-pay | Admitting: Cardiology

## 2014-08-17 ENCOUNTER — Telehealth: Payer: Self-pay | Admitting: Internal Medicine

## 2014-08-17 NOTE — Telephone Encounter (Signed)
New message      Did we receive patient's remote check

## 2014-08-17 NOTE — Telephone Encounter (Signed)
Transmission not received. Pt is having phone jack repair work. Pt currently has phone line wired directly from external phone box through bathroom window. Pt will attempt to send a transmission through the connection coming through window.

## 2014-08-17 NOTE — Telephone Encounter (Signed)
Patient still unable to send transmission.  Phone jack work to be scheduled and she will send when the work is complete.

## 2014-09-08 ENCOUNTER — Other Ambulatory Visit: Payer: Self-pay | Admitting: Internal Medicine

## 2014-09-08 DIAGNOSIS — Z1231 Encounter for screening mammogram for malignant neoplasm of breast: Secondary | ICD-10-CM

## 2014-09-12 DIAGNOSIS — R001 Bradycardia, unspecified: Secondary | ICD-10-CM

## 2014-09-14 ENCOUNTER — Ambulatory Visit (INDEPENDENT_AMBULATORY_CARE_PROVIDER_SITE_OTHER): Payer: Medicare Other | Admitting: *Deleted

## 2014-09-14 ENCOUNTER — Encounter: Payer: Self-pay | Admitting: Internal Medicine

## 2014-09-14 DIAGNOSIS — R001 Bradycardia, unspecified: Secondary | ICD-10-CM

## 2014-09-15 NOTE — Progress Notes (Signed)
Remote pacemaker transmission.   

## 2014-09-17 LAB — MDC_IDC_ENUM_SESS_TYPE_REMOTE
Battery Remaining Longevity: 25 mo
Battery Remaining Percentage: 24 %
Battery Voltage: 2.81 V
Brady Statistic AP VP Percent: 42 %
Brady Statistic AP VS Percent: 57 %
Brady Statistic AS VP Percent: 1 %
Brady Statistic AS VS Percent: 1 %
Brady Statistic RA Percent Paced: 99 %
Brady Statistic RV Percent Paced: 42 %
Date Time Interrogation Session: 20151128051541
Implantable Pulse Generator Model: 2210
Implantable Pulse Generator Serial Number: 2284731
Lead Channel Impedance Value: 380 Ohm
Lead Channel Impedance Value: 490 Ohm
Lead Channel Pacing Threshold Amplitude: 0.75 V
Lead Channel Pacing Threshold Amplitude: 1.25 V
Lead Channel Pacing Threshold Pulse Width: 0.4 ms
Lead Channel Pacing Threshold Pulse Width: 0.4 ms
Lead Channel Sensing Intrinsic Amplitude: 3.6 mV
Lead Channel Sensing Intrinsic Amplitude: 8.4 mV
Lead Channel Setting Pacing Amplitude: 2 V
Lead Channel Setting Pacing Amplitude: 2.5 V
Lead Channel Setting Pacing Pulse Width: 0.4 ms
Lead Channel Setting Sensing Sensitivity: 2 mV

## 2014-09-22 ENCOUNTER — Encounter: Payer: Self-pay | Admitting: Cardiology

## 2014-09-24 ENCOUNTER — Ambulatory Visit
Admission: RE | Admit: 2014-09-24 | Discharge: 2014-09-24 | Disposition: A | Discharge status: Home / Self Care | Payer: Medicare Other | Source: Ambulatory Visit | Attending: Internal Medicine | Admitting: Internal Medicine

## 2014-09-24 DIAGNOSIS — Z1231 Encounter for screening mammogram for malignant neoplasm of breast: Secondary | ICD-10-CM

## 2014-12-15 ENCOUNTER — Telehealth: Payer: Self-pay | Admitting: Cardiology

## 2014-12-15 ENCOUNTER — Encounter: Payer: Medicare Other | Admitting: *Deleted

## 2014-12-15 NOTE — Telephone Encounter (Signed)
LMOVM reminding pt to send remote transmission.   

## 2014-12-16 ENCOUNTER — Encounter: Payer: Self-pay | Admitting: Cardiology

## 2014-12-22 ENCOUNTER — Encounter: Payer: Self-pay | Admitting: Internal Medicine

## 2014-12-22 ENCOUNTER — Ambulatory Visit (INDEPENDENT_AMBULATORY_CARE_PROVIDER_SITE_OTHER): Payer: Medicare HMO | Admitting: *Deleted

## 2014-12-22 DIAGNOSIS — R001 Bradycardia, unspecified: Secondary | ICD-10-CM

## 2014-12-22 NOTE — Progress Notes (Signed)
Remote pacemaker transmission.   

## 2014-12-23 LAB — MDC_IDC_ENUM_SESS_TYPE_REMOTE
Battery Remaining Longevity: 22 mo
Battery Remaining Percentage: 21 %
Battery Voltage: 2.8 V
Brady Statistic AP VP Percent: 45 %
Brady Statistic AP VS Percent: 55 %
Brady Statistic AS VP Percent: 1 %
Brady Statistic AS VS Percent: 1 %
Brady Statistic RA Percent Paced: 99 %
Brady Statistic RV Percent Paced: 45 %
Date Time Interrogation Session: 20160308180825
Implantable Pulse Generator Model: 2210
Implantable Pulse Generator Serial Number: 2284731
Lead Channel Impedance Value: 380 Ohm
Lead Channel Impedance Value: 440 Ohm
Lead Channel Pacing Threshold Amplitude: 0.75 V
Lead Channel Pacing Threshold Amplitude: 1.25 V
Lead Channel Pacing Threshold Pulse Width: 0.4 ms
Lead Channel Pacing Threshold Pulse Width: 0.4 ms
Lead Channel Sensing Intrinsic Amplitude: 2.3 mV
Lead Channel Sensing Intrinsic Amplitude: 9 mV
Lead Channel Setting Pacing Amplitude: 2 V
Lead Channel Setting Pacing Amplitude: 2.5 V
Lead Channel Setting Pacing Pulse Width: 0.4 ms
Lead Channel Setting Sensing Sensitivity: 2 mV

## 2014-12-28 ENCOUNTER — Encounter: Payer: Self-pay | Admitting: Cardiology

## 2015-02-03 ENCOUNTER — Ambulatory Visit (INDEPENDENT_AMBULATORY_CARE_PROVIDER_SITE_OTHER): Payer: Medicare HMO | Admitting: Internal Medicine

## 2015-02-03 ENCOUNTER — Encounter: Payer: Self-pay | Admitting: Internal Medicine

## 2015-02-03 ENCOUNTER — Other Ambulatory Visit: Payer: Self-pay

## 2015-02-03 VITALS — BP 146/68 | HR 74 | Ht 65.0 in | Wt 191.8 lb

## 2015-02-03 DIAGNOSIS — I10 Essential (primary) hypertension: Secondary | ICD-10-CM

## 2015-02-03 DIAGNOSIS — I495 Sick sinus syndrome: Secondary | ICD-10-CM

## 2015-02-03 DIAGNOSIS — R001 Bradycardia, unspecified: Secondary | ICD-10-CM | POA: Diagnosis not present

## 2015-02-03 DIAGNOSIS — R0602 Shortness of breath: Secondary | ICD-10-CM | POA: Diagnosis not present

## 2015-02-03 LAB — MDC_IDC_ENUM_SESS_TYPE_INCLINIC
Battery Remaining Longevity: 24 mo
Battery Voltage: 2.78 V
Brady Statistic RA Percent Paced: 99.34 %
Brady Statistic RV Percent Paced: 46 %
Date Time Interrogation Session: 20160420124545
Implantable Pulse Generator Model: 2210
Implantable Pulse Generator Serial Number: 2284731
Lead Channel Impedance Value: 400 Ohm
Lead Channel Impedance Value: 450 Ohm
Lead Channel Pacing Threshold Amplitude: 0.75 V
Lead Channel Pacing Threshold Amplitude: 1 V
Lead Channel Pacing Threshold Pulse Width: 0.4 ms
Lead Channel Pacing Threshold Pulse Width: 0.4 ms
Lead Channel Sensing Intrinsic Amplitude: 11.4 mV
Lead Channel Sensing Intrinsic Amplitude: 3.1 mV
Lead Channel Setting Pacing Amplitude: 2 V
Lead Channel Setting Pacing Amplitude: 2.5 V
Lead Channel Setting Pacing Pulse Width: 0.4 ms
Lead Channel Setting Sensing Sensitivity: 2 mV

## 2015-02-03 MED ORDER — HYDROCHLOROTHIAZIDE 12.5 MG PO CAPS: 12.5000 mg | ORAL_CAPSULE | Freq: Every day | ORAL | Status: DC

## 2015-02-03 NOTE — Progress Notes (Signed)
Electrophysiology Office Note   Date:  02/03/2015   ID:  [name], DOB [birth year], MRN [id number]  PCP:  Foye Spurling, MD   Primary Electrophysiologist: Thompson Grayer, MD    Chief Complaint  Patient presents with  . Shortness of Breath     History of Present Illness: [name] is a 81 y.o. female who presents today for electrophysiology evaluation.   She has stable SOB.  She has chronic edema.  She has atypical chest pain (under her left chest) which is not exertional.  Stress test last year (for same symptoms) was low risk. Today, she denies symptoms of palpitations,  claudication, dizziness, presyncope, syncope, bleeding, or neurologic sequela. The patient is tolerating medications without difficulties and is otherwise without complaint today.    Past Medical History  Diagnosis Date  . 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 HYPERTENSION 02/24/2008  . CAROTID BRUIT 02/24/2008  . DIABETES MELLITUS, TYPE II 02/24/2008  . Benign paroxysmal positional vertigo 04/06/2010   Past Surgical History  Procedure Laterality Date  . Pacemaker insertion       Current Outpatient Prescriptions  Medication Sig Dispense Refill  . allopurinol (ZYLOPRIM) 100 MG tablet Take 100 mg by mouth daily.      Marland Kitchen amLODipine (NORVASC) 10 MG tablet Take 10 mg by mouth daily.    . cetirizine (ZYRTEC) 10 MG tablet Take 10 mg by mouth daily. For allergies    . dexlansoprazole (DEXILANT) 60 MG capsule Take 60 mg by mouth daily as needed. For acid reflux    . glimepiride (AMARYL) 1 MG tablet Take by mouth 2 (two) times daily. TAKE HALF ( 0.5 MG) IN MORNING AND HALF ( 0.5 MG) IN EVENING    . latanoprost (XALATAN) 0.005 % ophthalmic solution Place 1 drop into both eyes at bedtime.    . meclizine (ANTIVERT) 25 MG tablet Take 25 mg by mouth as needed.     . Multiple Vitamin (MULTIVITAMIN) capsule Take 1 capsule by mouth daily.      Marland Kitchen spironolactone (ALDACTONE) 25 MG tablet Take 25 mg by mouth every  other day.     . valsartan (DIOVAN) 160 MG tablet Take 160 mg by mouth daily.  2   No current facility-administered medications for this visit.    Allergies:   Aspirin; Diltiazem hcl; Hydrocodone-acetaminophen; Morphine; and Penicillins   Social History:  The patient  reports that she has never smoked. She does not have any smokeless tobacco history on file. She reports that she does not drink alcohol or use illicit drugs.   Family History:  The patient's family history includes Arthritis in her father; Bone cancer in her father; Breast cancer in her sister; Colon cancer in her sister; Congestive Heart Failure in her mother; Dementia in her sister; Hypertension in her sister; Lymphoma in her mother; Tuberculosis in her mother.    ROS:  Please see the history of present illness.   All other systems are reviewed and negative.    PHYSICAL EXAM: VS:  BP 146/68 mmHg  Pulse 74  Ht 5\' 5"  (1.651 m)  Wt 191 lb 12.8 oz (87 kg)  BMI 31.92 kg/m2  SpO2 96% , BMI Body mass index is 31.92 kg/(m^2). GEN: Well nourished, well developed, in no acute distress HEENT: normal Neck: no JVD, carotid bruits, or masses Cardiac: RRR; no murmurs, rubs, or gallops,trace edema  Respiratory:  clear to auscultation bilaterally, normal work of breathing GI: soft, nontender, nondistended, + BS  MS: no deformity or atrophy Skin: warm and dry, device pocket is well healed Neuro:  Strength and sensation are intact Psych: euthymic mood, full affect  Device interrogation is reviewed today in detail.  See PaceArt for details.   Recent Labs: No results found for requested labs within last 365 days.    Lipid Panel     Component Value Date/Time   CHOL  03/21/2010 0350    165        ATP III CLASSIFICATION:  <200     mg/dL   Desirable  200-239  mg/dL   Borderline High  >=240    mg/dL   High          TRIG 132 03/21/2010 0350   HDL 35* 03/21/2010 0350   CHOLHDL 4.7 03/21/2010 0350   VLDL 26 03/21/2010 0350    LDLCALC * 03/21/2010 0350    104        Total Cholesterol/HDL:CHD Risk Coronary Heart Disease Risk Table                     Men   Women  1/2 Average Risk   3.4   3.3  Average Risk       5.0   4.4  2 X Average Risk   9.6   7.1  3 X Average Risk  23.4   11.0        Use the calculated Patient Ratio above and the CHD Risk Table to determine the patient's CHD Risk.        ATP III CLASSIFICATION (LDL):  <100     mg/dL   Optimal  100-129  mg/dL   Near or Above                    Optimal  130-159  mg/dL   Borderline  160-189  mg/dL   High  >190     mg/dL   Very High     Wt Readings from Last 3 Encounters:  02/03/15 191 lb 12.8 oz (87 kg)  02/18/14 186 lb (84.369 kg)  01/30/14 192 lb (87.091 kg)      Other studies Reviewed: Additional studies/ records that were reviewed today include: myoview and echo from 2015 were reviewed at length with patient today   ASSESSMENT AND PLAN:  1.  Sick sinus Normal pacemaker function See Pace Art report No changes today  2. SOB Chronic Regular exercise is encouraged Weight loss is encouraged She likely has a component of diastolic dysfunction 2 gram sodium restriction advised She takes hctz occasionally but finds that she has leg cramps when she takes daily Could consider increasing spironolactone Labs followed by Dr Carlis Abbott  3. HTN Stable No change required today  4. Atypical chest pain myoview 2015 reviewed No further workup  5. Obesity Body mass index is 31.92 kg/(m^2). Weight loss advised  Current medicines are reviewed at length with the patient today.   The patient does not have concerns regarding her medicines.  The following changes were made today:  none   Follow-up: merlin, return to see Marcellina Millin tin 6 months, I will see in a  year  Signed, Thompson Grayer, MD  02/03/2015 11:24 AM     [facility] [address] [place] [address] [phone] (office) [phone] (fax)

## 2015-02-03 NOTE — Patient Instructions (Signed)
Medication Instructions:  None made  Labwork: None ordered  Testing/Procedures: None ordered    Follow-Up: Remote monitoring is used to monitor your Pacemaker of ICD from home. This monitoring reduces the number of office visits required to check your device to one time per year. It allows [place] to keep an eye on the functioning of your device to ensure it is working properly. You are scheduled for a device check from home on 05/05/15 You may send your transmission at any time that day. If you have a wireless device, the transmission will be sent automatically. After your physician reviews your transmission, you will receive a postcard with your next transmission date.  Your physician wants you to follow-up in: 6 months with Truitt Merle, NP and 12 months with Dr Vallery Ridge will receive a reminder letter in the mail two months in advance. If you don't receive a letter, please call our office to schedule the follow-up appointment.   Any Other Special Instructions Will Be Listed Below (If Applicable).  Low-Sodium Eating Plan Sodium raises blood pressure and causes water to be held in the body. Getting less sodium from food will help lower your blood pressure, reduce any swelling, and protect your heart, liver, and kidneys. We get sodium by adding salt (sodium chloride) to food. Most of our sodium comes from canned, boxed, and frozen foods. Restaurant foods, fast foods, and pizza are also very high in sodium. Even if you take medicine to lower your blood pressure or to reduce fluid in your body, getting less sodium from your food is important. WHAT IS MY PLAN? Most people should limit their sodium intake to 2,300 mg a day. Your health care provider recommends that you limit your sodium intake to 2 grams a day.  WHAT DO I NEED TO KNOW ABOUT THIS EATING PLAN? For the low-sodium eating plan, you will follow these general guidelines:  Choose foods with a % Daily Value for sodium of less than 5% (as  listed on the food label).   Use salt-free seasonings or herbs instead of table salt or sea salt.   Check with your health care provider or pharmacist before using salt substitutes.   Eat fresh foods.  Eat more vegetables and fruits.  Limit canned vegetables. If you do use them, rinse them well to decrease the sodium.   Limit cheese to 1 oz (28 g) per day.   Eat lower-sodium products, often labeled as "lower sodium" or "no salt added."  Avoid foods that contain monosodium glutamate (MSG). MSG is sometimes added to [place] food and some canned foods.  Check food labels (Nutrition Facts labels) on foods to learn how much sodium is in one serving.  Eat more home-cooked food and less restaurant, buffet, and fast food.  When eating at a restaurant, ask that your food be prepared with less salt or none, if possible.  HOW DO I READ FOOD LABELS FOR SODIUM INFORMATION? The Nutrition Facts label lists the amount of sodium in one serving of the food. If you eat more than one serving, you must multiply the listed amount of sodium by the number of servings. Food labels may also identify foods as:  Sodium free--Less than 5 mg in a serving.  Very low sodium--35 mg or less in a serving.  Low sodium--140 mg or less in a serving.  Light in sodium--50% less sodium in a serving. For example, if a food that usually has 300 mg of sodium is changed to become light  in sodium, it will have 150 mg of sodium.  Reduced sodium--25% less sodium in a serving. For example, if a food that usually has 400 mg of sodium is changed to reduced sodium, it will have 300 mg of sodium. WHAT FOODS CAN I EAT? Grains Low-sodium cereals, including oats, puffed wheat and rice, and shredded wheat cereals. Low-sodium crackers. Unsalted rice and pasta. Lower-sodium bread.  Vegetables Frozen or fresh vegetables. Low-sodium or reduced-sodium canned vegetables. Low-sodium or reduced-sodium tomato sauce and paste.  Low-sodium or reduced-sodium tomato and vegetable juices.  Fruits Fresh, frozen, and canned fruit. Fruit juice.  Meat and Other Protein Products Low-sodium canned tuna and salmon. Fresh or frozen meat, poultry, seafood, and fish. Lamb. Unsalted nuts. Dried beans, peas, and lentils without added salt. Unsalted canned beans. Homemade soups without salt. Eggs.  Dairy Milk. Soy milk. Ricotta cheese. Low-sodium or reduced-sodium cheeses. Yogurt.  Condiments Fresh and dried herbs and spices. Salt-free seasonings. Onion and garlic powders. Low-sodium varieties of mustard and ketchup. Lemon juice.  Fats and Oils Reduced-sodium salad dressings. Unsalted butter.  Other Unsalted popcorn and pretzels.  The items listed above may not be a complete list of recommended foods or beverages. Contact your dietitian for more options. WHAT FOODS ARE NOT RECOMMENDED? Grains Instant hot cereals. Bread stuffing, pancake, and biscuit mixes. Croutons. Seasoned rice or pasta mixes. Noodle soup cups. Boxed or frozen macaroni and cheese. Self-rising flour. Regular salted crackers. Vegetables Regular canned vegetables. Regular canned tomato sauce and paste. Regular tomato and vegetable juices. Frozen vegetables in sauces. Salted french fries. Olives. Angie Fava. Relishes. Sauerkraut. Salsa. Meat and Other Protein Products Salted, canned, smoked, spiced, or pickled meats, seafood, or fish. Bacon, ham, sausage, hot dogs, corned beef, chipped beef, and packaged luncheon meats. Salt pork. Jerky. Pickled herring. Anchovies, regular canned tuna, and sardines. Salted nuts. Dairy Processed cheese and cheese spreads. Cheese curds. Blue cheese and cottage cheese. Buttermilk.  Condiments Onion and garlic salt, seasoned salt, table salt, and sea salt. Canned and packaged gravies. Worcestershire sauce. Tartar sauce. Barbecue sauce. Teriyaki sauce. Soy sauce, including reduced sodium. Steak sauce. Fish sauce. Oyster sauce.  Cocktail sauce. Horseradish. Regular ketchup and mustard. Meat flavorings and tenderizers. Bouillon cubes. Hot sauce. Tabasco sauce. Marinades. Taco seasonings. Relishes. Fats and Oils Regular salad dressings. Salted butter. Margarine. Ghee. Bacon fat.  Other Potato and tortilla chips. Corn chips and puffs. Salted popcorn and pretzels. Canned or dried soups. Pizza. Frozen entrees and pot pies.  The items listed above may not be a complete list of foods and beverages to avoid. Contact your dietitian for more information. Document Released: 03/24/2002 Document Revised: 10/07/2013 Document Reviewed: 08/06/2013 [facility] Patient Information 2015 [place], [place]. This information is not intended to replace advice given to you by your health care provider. Make sure you discuss any questions you have with your health care provider.

## 2015-02-16 ENCOUNTER — Other Ambulatory Visit: Payer: Self-pay | Admitting: Orthopaedic Surgery

## 2015-02-16 DIAGNOSIS — M542 Cervicalgia: Secondary | ICD-10-CM

## 2015-02-18 ENCOUNTER — Other Ambulatory Visit: Payer: Medicare HMO

## 2015-02-23 ENCOUNTER — Ambulatory Visit
Admission: RE | Admit: 2015-02-23 | Discharge: 2015-02-23 | Disposition: A | Discharge status: Home / Self Care | Payer: Medicare HMO | Source: Ambulatory Visit | Attending: Orthopaedic Surgery | Admitting: Orthopaedic Surgery

## 2015-02-23 DIAGNOSIS — M542 Cervicalgia: Secondary | ICD-10-CM

## 2015-03-08 ENCOUNTER — Emergency Department (HOSPITAL_COMMUNITY)
Admission: EM | Admit: 2015-03-08 | Discharge: 2015-03-09 | Disposition: A | Discharge status: Home / Self Care | Payer: Medicare HMO | Attending: Emergency Medicine | Admitting: Emergency Medicine

## 2015-03-08 ENCOUNTER — Other Ambulatory Visit: Payer: Self-pay | Admitting: Otolaryngology

## 2015-03-08 ENCOUNTER — Encounter (HOSPITAL_COMMUNITY): Payer: Self-pay | Admitting: Emergency Medicine

## 2015-03-08 ENCOUNTER — Emergency Department (HOSPITAL_COMMUNITY): Payer: Medicare HMO

## 2015-03-08 ENCOUNTER — Ambulatory Visit
Admission: RE | Admit: 2015-03-08 | Discharge: 2015-03-08 | Disposition: A | Discharge status: Home / Self Care | Payer: Medicare HMO | Source: Ambulatory Visit | Attending: Otolaryngology | Admitting: Otolaryngology

## 2015-03-08 DIAGNOSIS — B029 Zoster without complications: Secondary | ICD-10-CM | POA: Diagnosis not present

## 2015-03-08 DIAGNOSIS — R110 Nausea: Secondary | ICD-10-CM | POA: Diagnosis not present

## 2015-03-08 DIAGNOSIS — Z79899 Other long term (current) drug therapy: Secondary | ICD-10-CM | POA: Insufficient documentation

## 2015-03-08 DIAGNOSIS — M542 Cervicalgia: Secondary | ICD-10-CM | POA: Insufficient documentation

## 2015-03-08 DIAGNOSIS — J329 Chronic sinusitis, unspecified: Secondary | ICD-10-CM

## 2015-03-08 DIAGNOSIS — E119 Type 2 diabetes mellitus without complications: Secondary | ICD-10-CM | POA: Insufficient documentation

## 2015-03-08 DIAGNOSIS — Z8669 Personal history of other diseases of the nervous system and sense organs: Secondary | ICD-10-CM | POA: Diagnosis not present

## 2015-03-08 DIAGNOSIS — Z880 Allergy status to penicillin: Secondary | ICD-10-CM | POA: Diagnosis not present

## 2015-03-08 DIAGNOSIS — I10 Essential (primary) hypertension: Secondary | ICD-10-CM | POA: Diagnosis not present

## 2015-03-08 DIAGNOSIS — R51 Headache: Principal | ICD-10-CM

## 2015-03-08 DIAGNOSIS — R519 Headache, unspecified: Secondary | ICD-10-CM

## 2015-03-08 LAB — CBC WITH DIFFERENTIAL/PLATELET
Basophils Absolute: 0 10*3/uL (ref 0.0–0.1)
Basophils Relative: 0 % (ref 0–1)
Eosinophils Absolute: 0.1 10*3/uL (ref 0.0–0.7)
Eosinophils Relative: 1 % (ref 0–5)
HCT: 40.6 % (ref 36.0–46.0)
Hemoglobin: 13.5 g/dL (ref 12.0–15.0)
Lymphocytes Relative: 22 % (ref 12–46)
Lymphs Abs: 2.2 10*3/uL (ref 0.7–4.0)
MCH: 27.7 pg (ref 26.0–34.0)
MCHC: 33.3 g/dL (ref 30.0–36.0)
MCV: 83.4 fL (ref 78.0–100.0)
Monocytes Absolute: 0.5 10*3/uL (ref 0.1–1.0)
Monocytes Relative: 5 % (ref 3–12)
Neutro Abs: 7.1 10*3/uL (ref 1.7–7.7)
Neutrophils Relative %: 72 % (ref 43–77)
Platelets: 216 10*3/uL (ref 150–400)
RBC: 4.87 MIL/uL (ref 3.87–5.11)
RDW: 14.5 % (ref 11.5–15.5)
WBC: 9.8 10*3/uL (ref 4.0–10.5)

## 2015-03-08 LAB — COMPREHENSIVE METABOLIC PANEL
ALT: 16 U/L (ref 14–54)
AST: 26 U/L (ref 15–41)
Albumin: 4.5 g/dL (ref 3.5–5.0)
Alkaline Phosphatase: 50 U/L (ref 38–126)
Anion gap: 13 (ref 5–15)
BUN: 24 mg/dL — ABNORMAL HIGH (ref 6–20)
CO2: 19 mmol/L — ABNORMAL LOW (ref 22–32)
Calcium: 9.4 mg/dL (ref 8.9–10.3)
Chloride: 104 mmol/L (ref 101–111)
Creatinine, Ser: 1.03 mg/dL — ABNORMAL HIGH (ref 0.44–1.00)
GFR calc Af Amer: 57 mL/min — ABNORMAL LOW (ref >60)
GFR calc non Af Amer: 49 mL/min — ABNORMAL LOW (ref >60)
Glucose, Bld: 127 mg/dL — ABNORMAL HIGH (ref 65–99)
Potassium: 4.1 mmol/L (ref 3.5–5.1)
Sodium: 136 mmol/L (ref 135–145)
Total Bilirubin: 0.7 mg/dL (ref 0.3–1.2)
Total Protein: 6.9 g/dL (ref 6.5–8.1)

## 2015-03-08 LAB — CBG MONITORING, ED: Glucose-Capillary: 151 mg/dL — ABNORMAL HIGH (ref 65–99)

## 2015-03-08 NOTE — ED Provider Notes (Signed)
CSN: [id number]     Arrival date & time 03/08/15  2143 History  This chart was scribed for Ory Elting, MD by Rayfield Citizen, ED Scribe. This patient was seen in room B15C/B15C and the patient's care was started at 11:58 PM.    Chief Complaint  Patient presents with  . Headache   Patient is a 81 y.o. female presenting with headaches. The history is provided by the patient. No language interpreter was used.  Headache Pain location:  R parietal and R temporal Quality: Shooting; throbbing. Radiates to:  R neck Severity currently:  Unable to specify Severity at highest:  Unable to specify Onset quality:  Sudden Duration:  4 days Timing:  Constant Progression:  Worsening Chronicity:  New Similar to prior headaches: no   Context: not activity and not stress   Relieved by:  Nothing Worsened by:  Nothing Ineffective treatments: tylenol, ibuprofen. Associated symptoms: eye pain, nausea and neck pain   Associated symptoms: no fever      HPI Comments: [name] is a 81 y.o. female who presents to the Emergency Department complaining of 4 days of right-sided headache, described as a shooting pain from right-sided forehead to the right side of the neck. She also notes a throbbing pain to her right eye and cheek, as well as nausea. Patient visited a local Urgent Care 2 days PTA and was diagnosed with a sinus infection; she was given a cortisol shot and doxycycline but her pain continued. She saw her ophthalmologist (Dr. Lanell Matar) today, who agreed with the diagnosis of a sinus infection, and sent her to an ENT (Dr. Simeon Craft); she has not yet received any results from the ENT. Patient states she has been taking tylenol and Aleve without relief. She denies fever or head injury.   PCP is Dr. Jeanann Lewandowsky.   Past Medical History  Diagnosis Date  . 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 HYPERTENSION 02/24/2008  . CAROTID BRUIT 02/24/2008  . DIABETES MELLITUS, TYPE II 02/24/2008  . Benign  paroxysmal positional vertigo 04/06/2010   Past Surgical History  Procedure Laterality Date  . Pacemaker insertion     Family History  Problem Relation Age of Onset  . Congestive Heart Failure Mother   . Tuberculosis Mother   . Lymphoma Mother   . Bone cancer Father   . Arthritis Father   . Breast cancer Sister   . Colon cancer Sister   . Hypertension Sister   . Dementia Sister    History  Substance Use Topics  . Smoking status: Never Smoker   . Smokeless tobacco: Not on file  . Alcohol Use: No   OB History    No data available     Review of Systems  Constitutional: Negative for fever.  Eyes: Positive for pain.  Gastrointestinal: Positive for nausea.  Musculoskeletal: Positive for neck pain.  Skin: Positive for rash.  Neurological: Positive for headaches.  All other systems reviewed and are negative.  Allergies  Aspirin; Diltiazem hcl; Hydrocodone-acetaminophen; Morphine; and Penicillins  Home Medications   Prior to Admission medications   Medication Sig Start Date End Date Taking? Authorizing Provider  allopurinol (ZYLOPRIM) 100 MG tablet Take 100 mg by mouth daily.     Yes Historical Provider, MD  amLODipine (NORVASC) 10 MG tablet Take 10 mg by mouth daily.   Yes Historical Provider, MD  cetirizine (ZYRTEC) 10 MG tablet Take 10 mg by mouth daily. For allergies   Yes Historical Provider, MD  doxycycline (VIBRA-TABS) 100 MG tablet Take 100 mg by mouth 2 (two) times daily.  03/06/15  Yes Historical Provider, MD  glimepiride (AMARYL) 1 MG tablet Take 0.5 mg by mouth 2 (two) times daily.    Yes Historical Provider, MD  latanoprost (XALATAN) 0.005 % ophthalmic solution Place 1 drop into both eyes at bedtime. 08/28/11  Yes Historical Provider, MD  Multiple Vitamin (MULTIVITAMIN WITH MINERALS) TABS tablet Take 1 tablet by mouth daily.   Yes Historical Provider, MD  spironolactone (ALDACTONE) 25 MG tablet Take 25 mg by mouth every other day.    Yes Historical Provider, MD   valsartan (DIOVAN) 160 MG tablet Take 160 mg by mouth daily. 12/30/14  Yes Historical Provider, MD  hydrochlorothiazide (MICROZIDE) 12.5 MG capsule Take 1 capsule (12.5 mg total) by mouth daily. Patient not taking: Reported on 03/08/2015 02/03/15   Thompson Grayer, MD   BP 160/68 mmHg  Pulse 70  Temp(Src) 98.2 F (36.8 C) (Oral)  Resp 13  SpO2 93% Physical Exam  Constitutional: She is oriented to person, place, and time. She appears well-developed and well-nourished. No distress.  HENT:  Head: Normocephalic and atraumatic.  Right Ear: Tympanic membrane normal.  Left Ear: Tympanic membrane normal.  Mouth/Throat: Oropharynx is clear and moist. No oropharyngeal exudate.  Moist mucous membranes. Some postnasal drip but clear in color.   Eyes: EOM are normal. Pupils are equal, round, and reactive to light.  Neck: Normal range of motion. Neck supple. No JVD present.  No lymph nodes. Neck is supple.   Cardiovascular: Normal rate, regular rhythm and normal heart sounds.  Exam reveals no gallop and no friction rub.   No murmur heard. Pulmonary/Chest: Effort normal and breath sounds normal. No respiratory distress. She has no wheezes. She has no rales.  Abdominal: Soft. Bowel sounds are normal. She exhibits no mass. There is no tenderness. There is no rebound and no guarding.  Musculoskeletal: Normal range of motion. She exhibits no edema.  Moves all extremities normally.   Lymphadenopathy:    She has no cervical adenopathy.  Neurological: She is alert and oriented to person, place, and time. She displays normal reflexes.  Skin: Skin is warm and dry. Rash noted.  Vesicles on the right-sided forehead and into the hairline consistent with shingles  Psychiatric: She has a normal mood and affect. Her behavior is normal.  Nursing note and vitals reviewed.   ED Course  Procedures   DIAGNOSTIC STUDIES: Oxygen Saturation is 99% on RA, normal by my interpretation.    COORDINATION OF CARE: 12:10  AM Discussed treatment plan with pt at bedside and pt agreed to plan.   Labs Review Labs Reviewed  COMPREHENSIVE METABOLIC PANEL - Abnormal; Notable for the following:    CO2 19 (*)    Glucose, Bld 127 (*)    BUN 24 (*)    Creatinine, Ser 1.03 (*)    GFR calc non Af Amer 49 (*)    GFR calc Af Amer 57 (*)    All other components within normal limits  CBG MONITORING, ED - Abnormal; Notable for the following:    Glucose-Capillary 151 (*)    All other components within normal limits  CBC WITH DIFFERENTIAL/PLATELET    Imaging Review Dg Sinuses Complete  03/08/2015   CLINICAL DATA:  Right maxillary and frontal sinus region pain for 5 days  EXAM: PARANASAL SINUSES - COMPLETE 3 + VIEW  COMPARISON:  March 21, 2012  FINDINGS: [place], water's, lateral, and submentovertex images were  obtained. Paranasal sinuses are clear. There is no air-fluid level. No bony destruction or expansion. There is rightward deviation of the nasal septum.  IMPRESSION: Paranasal sinuses clear.  Deviated nasal septum.   Electronically Signed   By: Lowella Grip III M.D.   On: 03/08/2015 14:18   Ct Head Wo Contrast  03/08/2015   CLINICAL DATA:  RIGHT-sided headache beginning 4 days ago, diagnosis sinusitis. History of diabetes, hypertension and vertigo.  EXAM: CT HEAD WITHOUT CONTRAST  TECHNIQUE: Contiguous axial images were obtained from the base of the skull through the vertex without intravenous contrast.  COMPARISON:  CT head March 20, 2010 and sinus radiographs Mar 08, 2015 at 1354 hours  FINDINGS: The ventricles and sulci are normal for age. No intraparenchymal hemorrhage, mass effect nor midline shift. Patchy supratentorial white matter hypodensities are less than expected for patient's age and though non-specific suggest sequelae of chronic small vessel ischemic disease. No acute large vascular territory infarcts.  No abnormal extra-axial fluid collections. Basal cisterns are patent. Moderate calcific atherosclerosis of  the carotid siphons.  No skull fracture. Hyperostosis totalis internus, including involvement of the petrous apex. The included ocular globes and orbital contents are non-suspicious. The mastoid aircells and included paranasal sinuses are well-aerated.  IMPRESSION: No acute intracranial process; normal noncontrast CT head for age.  No CT findings of paranasal sinusitis.   Electronically Signed   By: Elon Alas   On: 03/08/2015 23:41     EKG Interpretation None      MDM   Final diagnoses:  None   Medications  valACYclovir (VALTREX) tablet 1,000 mg (1,000 mg Oral Given 03/09/15 0215)  acetaminophen (TYLENOL) tablet 1,000 mg (not administered)  ketorolac (TORADOL) injection 60 mg (60 mg Intramuscular Given 03/09/15 0108)  ondansetron (ZOFRAN-ODT) disintegrating tablet 8 mg (8 mg Oral Given 03/09/15 0033)  ondansetron (ZOFRAN-ODT) disintegrating tablet 8 mg (8 mg Oral Given 03/09/15 0224)   Has zoster on the face.  Suspect this is the source of her ongoing pain.  Will treat with valtrex and close follow up with her PMD and ophthalmology Dr. Venetia Maxon.  Patient cannot take narcotics nor neurontin  I personally performed the services described in this documentation, which was scribed in my presence. The recorded information has been reviewed and is accurate.      Veatrice Kells, MD 03/09/15 [phone]

## 2015-03-08 NOTE — ED Notes (Signed)
Pt transported to CT ?

## 2015-03-08 NOTE — ED Notes (Signed)
Pt. reports right side headache onset last Thursday with mild nausea and chills , seen at an Urgent care diagnosed with sinusitis currently taking Doxycycline , denies fever or head injury. Alert and oriented .

## 2015-03-09 ENCOUNTER — Emergency Department (HOSPITAL_COMMUNITY): Payer: Medicare HMO

## 2015-03-09 ENCOUNTER — Observation Stay (HOSPITAL_COMMUNITY)
Admission: EM | Admit: 2015-03-09 | Discharge: 2015-03-10 | Disposition: A | Discharge status: Home / Self Care | Payer: Medicare HMO | Attending: Family Medicine | Admitting: Family Medicine

## 2015-03-09 ENCOUNTER — Encounter (HOSPITAL_COMMUNITY): Payer: Self-pay | Admitting: Emergency Medicine

## 2015-03-09 DIAGNOSIS — Z23 Encounter for immunization: Secondary | ICD-10-CM | POA: Insufficient documentation

## 2015-03-09 DIAGNOSIS — Z888 Allergy status to other drugs, medicaments and biological substances status: Secondary | ICD-10-CM | POA: Diagnosis not present

## 2015-03-09 DIAGNOSIS — Z6830 Body mass index (BMI) 30.0-30.9, adult: Secondary | ICD-10-CM | POA: Diagnosis not present

## 2015-03-09 DIAGNOSIS — Z880 Allergy status to penicillin: Secondary | ICD-10-CM | POA: Insufficient documentation

## 2015-03-09 DIAGNOSIS — Z800 Family history of malignant neoplasm of digestive organs: Secondary | ICD-10-CM | POA: Insufficient documentation

## 2015-03-09 DIAGNOSIS — I495 Sick sinus syndrome: Secondary | ICD-10-CM | POA: Diagnosis not present

## 2015-03-09 DIAGNOSIS — Z886 Allergy status to analgesic agent status: Secondary | ICD-10-CM | POA: Insufficient documentation

## 2015-03-09 DIAGNOSIS — E119 Type 2 diabetes mellitus without complications: Secondary | ICD-10-CM | POA: Diagnosis not present

## 2015-03-09 DIAGNOSIS — Z950 Presence of cardiac pacemaker: Secondary | ICD-10-CM | POA: Insufficient documentation

## 2015-03-09 DIAGNOSIS — B029 Zoster without complications: Secondary | ICD-10-CM | POA: Diagnosis not present

## 2015-03-09 DIAGNOSIS — Z8249 Family history of ischemic heart disease and other diseases of the circulatory system: Secondary | ICD-10-CM | POA: Diagnosis not present

## 2015-03-09 DIAGNOSIS — R079 Chest pain, unspecified: Principal | ICD-10-CM | POA: Diagnosis present

## 2015-03-09 DIAGNOSIS — Z808 Family history of malignant neoplasm of other organs or systems: Secondary | ICD-10-CM | POA: Diagnosis not present

## 2015-03-09 DIAGNOSIS — I10 Essential (primary) hypertension: Secondary | ICD-10-CM | POA: Diagnosis present

## 2015-03-09 DIAGNOSIS — Z803 Family history of malignant neoplasm of breast: Secondary | ICD-10-CM | POA: Diagnosis not present

## 2015-03-09 LAB — COMPREHENSIVE METABOLIC PANEL
ALT: 15 U/L (ref 14–54)
AST: 25 U/L (ref 15–41)
Albumin: 4.2 g/dL (ref 3.5–5.0)
Alkaline Phosphatase: 54 U/L (ref 38–126)
Anion gap: 10 (ref 5–15)
BUN: 23 mg/dL — ABNORMAL HIGH (ref 6–20)
CO2: 24 mmol/L (ref 22–32)
Calcium: 9 mg/dL (ref 8.9–10.3)
Chloride: 99 mmol/L — ABNORMAL LOW (ref 101–111)
Creatinine, Ser: 1.09 mg/dL — ABNORMAL HIGH (ref 0.44–1.00)
GFR calc Af Amer: 53 mL/min — ABNORMAL LOW (ref >60)
GFR calc non Af Amer: 46 mL/min — ABNORMAL LOW (ref >60)
Glucose, Bld: 119 mg/dL — ABNORMAL HIGH (ref 65–99)
Potassium: 4.1 mmol/L (ref 3.5–5.1)
Sodium: 133 mmol/L — ABNORMAL LOW (ref 135–145)
Total Bilirubin: 0.8 mg/dL (ref 0.3–1.2)
Total Protein: 6.7 g/dL (ref 6.5–8.1)

## 2015-03-09 LAB — LIPID PANEL
Cholesterol: 162 mg/dL (ref 0–200)
HDL: 43 mg/dL (ref >40)
LDL Cholesterol: 97 mg/dL (ref 0–99)
Total CHOL/HDL Ratio: 3.8 RATIO
Triglycerides: 108 mg/dL (ref <150)
VLDL: 22 mg/dL (ref 0–40)

## 2015-03-09 LAB — CBC WITH DIFFERENTIAL/PLATELET
Basophils Absolute: 0 10*3/uL (ref 0.0–0.1)
Basophils Relative: 0 % (ref 0–1)
Eosinophils Absolute: 0 10*3/uL (ref 0.0–0.7)
Eosinophils Relative: 0 % (ref 0–5)
HCT: 37.3 % (ref 36.0–46.0)
Hemoglobin: 12.4 g/dL (ref 12.0–15.0)
Lymphocytes Relative: 13 % (ref 12–46)
Lymphs Abs: 0.9 10*3/uL (ref 0.7–4.0)
MCH: 27.4 pg (ref 26.0–34.0)
MCHC: 33.2 g/dL (ref 30.0–36.0)
MCV: 82.5 fL (ref 78.0–100.0)
Monocytes Absolute: 0.6 10*3/uL (ref 0.1–1.0)
Monocytes Relative: 8 % (ref 3–12)
Neutro Abs: 5.9 10*3/uL (ref 1.7–7.7)
Neutrophils Relative %: 79 % — ABNORMAL HIGH (ref 43–77)
Platelets: 228 10*3/uL (ref 150–400)
RBC: 4.52 MIL/uL (ref 3.87–5.11)
RDW: 14.3 % (ref 11.5–15.5)
WBC: 7.4 10*3/uL (ref 4.0–10.5)

## 2015-03-09 LAB — TROPONIN I
Troponin I: 0.03 ng/mL (ref <0.031)
Troponin I: 0.03 ng/mL (ref <0.031)

## 2015-03-09 MED ORDER — NAPROXEN 375 MG PO TABS
375.0000 mg | ORAL_TABLET | Freq: Two times a day (BID) | ORAL | Status: DC
Start: 2015-03-09 — End: 2016-04-17

## 2015-03-09 MED ORDER — VALACYCLOVIR HCL 500 MG PO TABS
1000.0000 mg | ORAL_TABLET | Freq: Two times a day (BID) | ORAL | Status: DC
  Administered 2015-03-09: 1000 mg via ORAL
  Filled 2015-03-09: qty 2

## 2015-03-09 MED ORDER — ONDANSETRON HCL 4 MG/2ML IJ SOLN
4.0000 mg | Freq: Four times a day (QID) | INTRAMUSCULAR | Status: DC | PRN
  Administered 2015-03-09: 4 mg via INTRAVENOUS
  Filled 2015-03-09: qty 2

## 2015-03-09 MED ORDER — ACETAMINOPHEN 500 MG PO TABS: 1000.0000 mg | ORAL_TABLET | Freq: Once | ORAL | Status: DC

## 2015-03-09 MED ORDER — OXYCODONE HCL 5 MG PO TABS
5.0000 mg | ORAL_TABLET | ORAL | Status: DC | PRN
  Administered 2015-03-10: 5 mg via ORAL
  Filled 2015-03-09 (×2): qty 1

## 2015-03-09 MED ORDER — INSULIN ASPART 100 UNIT/ML ~~LOC~~ SOLN: 0.0000 [IU] | Freq: Three times a day (TID) | SUBCUTANEOUS | Status: DC

## 2015-03-09 MED ORDER — LATANOPROST 0.005 % OP SOLN
1.0000 [drp] | Freq: Every day | OPHTHALMIC | Status: DC
  Administered 2015-03-09: 1 [drp] via OPHTHALMIC
  Filled 2015-03-09: qty 2.5

## 2015-03-09 MED ORDER — INSULIN ASPART 100 UNIT/ML ~~LOC~~ SOLN: 0.0000 [IU] | Freq: Every day | SUBCUTANEOUS | Status: DC

## 2015-03-09 MED ORDER — ONDANSETRON HCL 4 MG/2ML IJ SOLN
4.0000 mg | Freq: Once | INTRAMUSCULAR | Status: AC
  Administered 2015-03-09: 4 mg via INTRAVENOUS
  Filled 2015-03-09: qty 2

## 2015-03-09 MED ORDER — VALACYCLOVIR HCL 500 MG PO TABS
1000.0000 mg | ORAL_TABLET | Freq: Three times a day (TID) | ORAL | Status: DC
  Administered 2015-03-09 – 2015-03-10 (×2): 1000 mg via ORAL
  Filled 2015-03-09 (×4): qty 2

## 2015-03-09 MED ORDER — SPIRONOLACTONE 25 MG PO TABS
25.0000 mg | ORAL_TABLET | ORAL | Status: DC
  Filled 2015-03-09: qty 1

## 2015-03-09 MED ORDER — FENTANYL CITRATE (PF) 100 MCG/2ML IJ SOLN
50.0000 ug | Freq: Once | INTRAMUSCULAR | Status: AC
  Administered 2015-03-09: 50 ug via INTRAVENOUS
  Filled 2015-03-09: qty 2

## 2015-03-09 MED ORDER — AMLODIPINE BESYLATE 10 MG PO TABS
10.0000 mg | ORAL_TABLET | Freq: Every day | ORAL | Status: DC
  Administered 2015-03-09: 10 mg via ORAL
  Filled 2015-03-09 (×2): qty 1

## 2015-03-09 MED ORDER — PROMETHAZINE HCL 12.5 MG RE SUPP: 12.5000 mg | Freq: Three times a day (TID) | RECTAL | Status: DC | PRN

## 2015-03-09 MED ORDER — LORATADINE 10 MG PO TABS
10.0000 mg | ORAL_TABLET | Freq: Every day | ORAL | Status: DC
  Administered 2015-03-09 – 2015-03-10 (×2): 10 mg via ORAL
  Filled 2015-03-09 (×2): qty 1

## 2015-03-09 MED ORDER — ACETAMINOPHEN 325 MG PO TABS
650.0000 mg | ORAL_TABLET | ORAL | Status: DC | PRN
  Administered 2015-03-09 – 2015-03-10 (×2): 650 mg via ORAL
  Filled 2015-03-09 (×2): qty 2

## 2015-03-09 MED ORDER — ONDANSETRON 4 MG PO TBDP
8.0000 mg | ORAL_TABLET | Freq: Once | ORAL | Status: AC
  Administered 2015-03-09: 8 mg via ORAL
  Filled 2015-03-09: qty 2

## 2015-03-09 MED ORDER — ALLOPURINOL 100 MG PO TABS
100.0000 mg | ORAL_TABLET | Freq: Every day | ORAL | Status: DC
  Administered 2015-03-09: 100 mg via ORAL
  Filled 2015-03-09 (×2): qty 1

## 2015-03-09 MED ORDER — IRBESARTAN 150 MG PO TABS
150.0000 mg | ORAL_TABLET | Freq: Every day | ORAL | Status: DC
  Administered 2015-03-09 – 2015-03-10 (×2): 150 mg via ORAL
  Filled 2015-03-09 (×2): qty 1

## 2015-03-09 MED ORDER — PNEUMOCOCCAL VAC POLYVALENT 25 MCG/0.5ML IJ INJ
0.5000 mL | INJECTION | INTRAMUSCULAR | Status: AC
  Administered 2015-03-10: 0.5 mL via INTRAMUSCULAR
  Filled 2015-03-09: qty 0.5

## 2015-03-09 MED ORDER — ENOXAPARIN SODIUM 40 MG/0.4ML ~~LOC~~ SOLN
40.0000 mg | SUBCUTANEOUS | Status: DC
  Administered 2015-03-09: 40 mg via SUBCUTANEOUS
  Filled 2015-03-09 (×2): qty 0.4

## 2015-03-09 MED ORDER — PREGABALIN 25 MG PO CAPS
50.0000 mg | ORAL_CAPSULE | Freq: Two times a day (BID) | ORAL | Status: DC
  Administered 2015-03-09 – 2015-03-10 (×2): 50 mg via ORAL
  Filled 2015-03-09 (×2): qty 2

## 2015-03-09 MED ORDER — ENSURE ENLIVE PO LIQD
237.0000 mL | Freq: Two times a day (BID) | ORAL | Status: DC
Start: 2015-03-10 — End: 2015-03-10
  Administered 2015-03-10: 237 mL via ORAL

## 2015-03-09 MED ORDER — NITROGLYCERIN 0.4 MG SL SUBL: 0.4000 mg | SUBLINGUAL_TABLET | SUBLINGUAL | Status: DC | PRN

## 2015-03-09 MED ORDER — KETOROLAC TROMETHAMINE 60 MG/2ML IM SOLN
60.0000 mg | Freq: Once | INTRAMUSCULAR | Status: AC
  Administered 2015-03-09: 60 mg via INTRAMUSCULAR
  Filled 2015-03-09: qty 2

## 2015-03-09 MED ORDER — ADULT MULTIVITAMIN W/MINERALS CH
1.0000 | ORAL_TABLET | Freq: Every day | ORAL | Status: DC
  Administered 2015-03-10: 1 via ORAL
  Filled 2015-03-09 (×2): qty 1

## 2015-03-09 MED ORDER — ONDANSETRON HCL 4 MG/2ML IJ SOLN: 4.0000 mg | Freq: Three times a day (TID) | INTRAMUSCULAR | Status: DC | PRN

## 2015-03-09 MED ORDER — VALACYCLOVIR HCL 1 G PO TABS: 1000.0000 mg | ORAL_TABLET | Freq: Three times a day (TID) | ORAL | Status: AC

## 2015-03-09 MED ORDER — FENTANYL CITRATE (PF) 100 MCG/2ML IJ SOLN: 25.0000 ug | INTRAMUSCULAR | Status: DC | PRN

## 2015-03-09 MED ORDER — GI COCKTAIL ~~LOC~~
30.0000 mL | Freq: Four times a day (QID) | ORAL | Status: DC | PRN
Start: 2015-03-09 — End: 2015-03-10
  Filled 2015-03-09: qty 30

## 2015-03-09 MED ORDER — SODIUM CHLORIDE 0.9 % IV BOLUS (SEPSIS)
500.0000 mL | Freq: Once | INTRAVENOUS | Status: AC
  Administered 2015-03-09: 500 mL via INTRAVENOUS

## 2015-03-09 NOTE — ED Provider Notes (Signed)
CSN: [id number]     Arrival date & time 03/09/15  1414 History   First MD Initiated Contact with Patient 03/09/15 1435     Chief Complaint  Patient presents with  . Chest Pain     (Consider location/radiation/quality/duration/timing/severity/associated sxs/prior Treatment) Patient is a 81 y.o. female presenting with chest pain. The history is provided by the patient.  Chest Pain Pain location:  L chest Pain quality: pressure   Pain radiates to:  Does not radiate Pain radiates to the back: no   Pain severity:  Mild Onset quality:  Gradual Duration:  6 hours Timing:  Constant Progression:  Unchanged Chronicity:  New Context: at rest   Relieved by:  Nothing Worsened by:  Nothing tried Ineffective treatments:  None tried Associated symptoms: shortness of breath   Associated symptoms: no abdominal pain, no back pain, no cough, no dizziness, no fatigue, no fever, no headache, no nausea and not vomiting     Past Medical History  Diagnosis Date  . 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 HYPERTENSION 02/24/2008  . CAROTID BRUIT 02/24/2008  . DIABETES MELLITUS, TYPE II 02/24/2008  . Benign paroxysmal positional vertigo 04/06/2010   Past Surgical History  Procedure Laterality Date  . Pacemaker insertion     Family History  Problem Relation Age of Onset  . Congestive Heart Failure Mother   . Tuberculosis Mother   . Lymphoma Mother   . Bone cancer Father   . Arthritis Father   . Breast cancer Sister   . Colon cancer Sister   . Hypertension Sister   . Dementia Sister    History  Substance Use Topics  . Smoking status: Never Smoker   . Smokeless tobacco: Not on file  . Alcohol Use: No   OB History    No data available     Review of Systems  Constitutional: Negative for fever and fatigue.  HENT: Negative for congestion and drooling.   Eyes: Negative for pain.  Respiratory: Positive for shortness of breath. Negative for cough.   Cardiovascular: Positive for chest pain.   Gastrointestinal: Negative for nausea, vomiting, abdominal pain and diarrhea.  Genitourinary: Negative for dysuria and hematuria.  Musculoskeletal: Negative for back pain, gait problem and neck pain.  Skin: Negative for color change.  Neurological: Negative for dizziness and headaches.  Hematological: Negative for adenopathy.  Psychiatric/Behavioral: Negative for behavioral problems.  All other systems reviewed and are negative.     Allergies  Aspirin; Diltiazem hcl; Hydrocodone-acetaminophen; Morphine; Penicillins; and Neurontin  Home Medications   Prior to Admission medications   Medication Sig Start Date End Date Taking? Authorizing Provider  allopurinol (ZYLOPRIM) 100 MG tablet Take 100 mg by mouth daily.      Historical Provider, MD  amLODipine (NORVASC) 10 MG tablet Take 10 mg by mouth daily.    Historical Provider, MD  cetirizine (ZYRTEC) 10 MG tablet Take 10 mg by mouth daily. For allergies    Historical Provider, MD  doxycycline (VIBRA-TABS) 100 MG tablet Take 100 mg by mouth 2 (two) times daily.  03/06/15   Historical Provider, MD  glimepiride (AMARYL) 1 MG tablet Take 0.5 mg by mouth 2 (two) times daily.     Historical Provider, MD  hydrochlorothiazide (MICROZIDE) 12.5 MG capsule Take 1 capsule (12.5 mg total) by mouth daily. Patient not taking: Reported on 03/08/2015 02/03/15   Thompson Grayer, MD  latanoprost (XALATAN) 0.005 % ophthalmic solution Place 1 drop into both eyes at bedtime. 08/28/11  Historical Provider, MD  Multiple Vitamin (MULTIVITAMIN WITH MINERALS) TABS tablet Take 1 tablet by mouth daily.    Historical Provider, MD  naproxen (NAPROSYN) 375 MG tablet Take 1 tablet (375 mg total) by mouth 2 (two) times daily. 03/09/15   April Palumbo, MD  promethazine (PHENERGAN) 12.5 MG suppository Place 1 suppository (12.5 mg total) rectally every 8 (eight) hours as needed for refractory nausea / vomiting. 03/09/15   April Palumbo, MD  spironolactone (ALDACTONE) 25 MG tablet  Take 25 mg by mouth every other day.     Historical Provider, MD  valACYclovir (VALTREX) 1000 MG tablet Take 1 tablet (1,000 mg total) by mouth 3 (three) times daily. 03/09/15 03/23/15  April Palumbo, MD  valsartan (DIOVAN) 160 MG tablet Take 160 mg by mouth daily. 12/30/14   Historical Provider, MD   BP 147/68 mmHg  Pulse 72  Temp(Src) 97.7 F (36.5 C) (Oral)  Resp 17  Ht 5\' 4"  (1.626 m)  Wt 191 lb (86.637 kg)  BMI 32.77 kg/m2  SpO2 99% Physical Exam  Constitutional: She is oriented to person, place, and time. She appears well-developed and well-nourished.  HENT:  Head: Normocephalic.  Mouth/Throat: Oropharynx is clear and moist. No oropharyngeal exudate.  Several vesicular lesions noted in the right periorbital area. Mild tearing coming from the right eye.  Eyes: Conjunctivae and EOM are normal. Pupils are equal, round, and reactive to light.  Neck: Normal range of motion. Neck supple.  Cardiovascular: Normal rate, regular rhythm, normal heart sounds and intact distal pulses.  Exam reveals no gallop and no friction rub.   No murmur heard. Pulmonary/Chest: Effort normal and breath sounds normal. No respiratory distress. She has no wheezes.  Abdominal: Soft. Bowel sounds are normal. There is no tenderness. There is no rebound and no guarding.  Musculoskeletal: Normal range of motion. She exhibits no edema or tenderness.  Neurological: She is alert and oriented to person, place, and time.  Skin: Skin is warm and dry.  Psychiatric: She has a normal mood and affect. Her behavior is normal.  Nursing note and vitals reviewed.   ED Course  Procedures (including critical care time) Labs Review Labs Reviewed  CBC WITH DIFFERENTIAL/PLATELET - Abnormal; Notable for the following:    Neutrophils Relative % 79 (*)    All other components within normal limits  COMPREHENSIVE METABOLIC PANEL - Abnormal; Notable for the following:    Sodium 133 (*)    Chloride 99 (*)    Glucose, Bld 119 (*)     BUN 23 (*)    Creatinine, Ser 1.09 (*)    GFR calc non Af Amer 46 (*)    GFR calc Af Amer 53 (*)    All other components within normal limits  TROPONIN I - Abnormal; Notable for the following:    Troponin I 0.04 (*)    All other components within normal limits  TROPONIN I - Abnormal; Notable for the following:    Troponin I 0.05 (*)    All other components within normal limits  GLUCOSE, CAPILLARY - Abnormal; Notable for the following:    Glucose-Capillary 175 (*)    All other components within normal limits  TROPONIN I  TROPONIN I  LIPID PANEL  HEMOGLOBIN A1C  HEMOGLOBIN A1C    Imaging Review Dg Sinuses Complete  03/08/2015   CLINICAL DATA:  Right maxillary and frontal sinus region pain for 5 days  EXAM: PARANASAL SINUSES - COMPLETE 3 + VIEW  COMPARISON:  March 21, 2012  FINDINGS: Caldwell, water's, lateral, and submentovertex images were obtained. Paranasal sinuses are clear. There is no air-fluid level. No bony destruction or expansion. There is rightward deviation of the nasal septum.  IMPRESSION: Paranasal sinuses clear.  Deviated nasal septum.   Electronically Signed   By: Lowella Grip III M.D.   On: 03/08/2015 14:18   Ct Head Wo Contrast  03/08/2015   CLINICAL DATA:  RIGHT-sided headache beginning 4 days ago, diagnosis sinusitis. History of diabetes, hypertension and vertigo.  EXAM: CT HEAD WITHOUT CONTRAST  TECHNIQUE: Contiguous axial images were obtained from the base of the skull through the vertex without intravenous contrast.  COMPARISON:  CT head March 20, 2010 and sinus radiographs Mar 08, 2015 at 1354 hours  FINDINGS: The ventricles and sulci are normal for age. No intraparenchymal hemorrhage, mass effect nor midline shift. Patchy supratentorial white matter hypodensities are less than expected for patient's age and though non-specific suggest sequelae of chronic small vessel ischemic disease. No acute large vascular territory infarcts.  No abnormal extra-axial fluid  collections. Basal cisterns are patent. Moderate calcific atherosclerosis of the carotid siphons.  No skull fracture. Hyperostosis totalis internus, including involvement of the petrous apex. The included ocular globes and orbital contents are non-suspicious. The mastoid aircells and included paranasal sinuses are well-aerated.  IMPRESSION: No acute intracranial process; normal noncontrast CT head for age.  No CT findings of paranasal sinusitis.   Electronically Signed   By: Elon Alas   On: 03/08/2015 23:41     EKG Interpretation None      MDM   Final diagnoses:  Chest pain    2:51 PM 81 y.o. female w hx of HTN, DM, who presents with left-sided chest pain and shortness of breath which she noticed upon awakening this morning. She was seen here last night and diagnosed with right-sided facial shingles. She went for a follow-up visit with her primary care doctor this morning and endorse chest pain. She was sent here for further evaluation. She currently describes a 5 out of 10 left-sided chest pressure. Vital signs otherwise unremarkable here. We'll give fentanyl and Zofran. She got aspirin and 2 nitroglycerin in route.  Plan on admission. ECG reviewed and interpreted by me, will not transfer to chart.     Pamella Pert, MD 03/10/15 [phone]

## 2015-03-09 NOTE — H&P (Signed)
History and Physical        Hospital Admission Note Date: 03/09/2015  Patient name: [name] Medical record number: [id number] Date of birth: 1930-05-18 Age: 81 y.o. Gender: female  PCP: Foye Spurling, MD  Referring physician: Dr Sabra Heck  Chief Complaint:  Chest pain  HPI: Patient is a 81 year old female with hypertension, diabetes mellitus, recently diagnosed with right-sided facial shingles yesterday, she has not started Valtrex yet presented to ED with chest pain today. Patient reported that she woke up this morning and had left-sided chest pain, under her left breast with some shortness of breath. She had no palpitation or any radiation of the chest pain towards her left arm or the neck. Describes the chest pain as 5/10, sharp, intermittent. Patient went to see her PCP and was recommended to come to the ED. Chest pain has resolved after 2 nitroglycerin sublingual. Also patient was seen last night in ED for headache for last 4 days described as shooting pain from the right-sided forehead to the right side of the neck. Patient had been taking Aleve and Tylenol without much relief. Patient was noticed to have vesicles on the right side forehead into the hairline consistent with the shingles and was started on Valtrex. Per patient she has not started it yet .  EDP clinicians noted, EKG showed no acute ST-T wave changes are just about ischemia, troponin 1 negative. Patient was admitted for observation for chest pain rule out.  Review of Systems:  Constitutional: Denies fever, chills, diaphoresis, poor appetite and fatigue.  HEENT: Denies photophobia, eye pain, redness, hearing loss, ear pain, congestion, sore throat, rhinorrhea, sneezing, mouth sores, trouble swallowing, neck pain, neck stiffness and tinnitus.   headache + right frontal and parietal data vesicles on the right  side of the forehead and extending into the hairline and down towards the nose.   Respiratory: Denies cough, chest tightness,  and wheezing.   Cardiovascular: Denies palpitations and leg swelling. + please see history of present illness Gastrointestinal: Denies nausea, vomiting, abdominal pain, diarrhea, constipation, blood in stool and abdominal distention.  Genitourinary: Denies dysuria, urgency, frequency, hematuria, flank pain and difficulty urinating.  Musculoskeletal: Denies myalgias, joint swelling, arthralgias and gait problem. + chronic back pain due to arthritis  Skin: Denies pallor, rash and wound.  Neurological: Denies dizziness, seizures, syncope, weakness, light-headedness, numbness and headaches.  Hematological: Denies adenopathy. Easy bruising, personal or family bleeding history  Psychiatric/Behavioral: Denies suicidal ideation, mood changes, confusion, nervousness, sleep disturbance and agitation  Past Medical History: Past Medical History  Diagnosis Date  . 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 HYPERTENSION 02/24/2008  . CAROTID BRUIT 02/24/2008  . DIABETES MELLITUS, TYPE II 02/24/2008  . Benign paroxysmal positional vertigo 04/06/2010    Past Surgical History  Procedure Laterality Date  . Pacemaker insertion      Medications: Prior to Admission medications   Medication Sig Start Date End Date Taking? Authorizing Provider  allopurinol (ZYLOPRIM) 100 MG tablet Take 100 mg by mouth daily.     Yes Historical Provider, MD  amLODipine (NORVASC) 10 MG tablet Take 10 mg by mouth daily.   Yes Historical Provider, MD  cetirizine (  ZYRTEC) 10 MG tablet Take 10 mg by mouth daily. For allergies   Yes Historical Provider, MD  doxycycline (VIBRA-TABS) 100 MG tablet Take 100 mg by mouth 2 (two) times daily.  03/06/15  Yes Historical Provider, MD  glimepiride (AMARYL) 1 MG tablet Take 0.5 mg by mouth 2 (two) times daily.    Yes Historical Provider, MD  hydrochlorothiazide (MICROZIDE)  12.5 MG capsule Take 1 capsule (12.5 mg total) by mouth daily. Patient taking differently: Take 12.5 mg by mouth as needed (for blood pressure and fluid).  02/03/15  Yes Thompson Grayer, MD  latanoprost (XALATAN) 0.005 % ophthalmic solution Place 1 drop into both eyes at bedtime. 08/28/11  Yes Historical Provider, MD  Multiple Vitamin (MULTIVITAMIN WITH MINERALS) TABS tablet Take 1 tablet by mouth daily.   Yes Historical Provider, MD  spironolactone (ALDACTONE) 25 MG tablet Take 25 mg by mouth every other day.    Yes Historical Provider, MD  valsartan (DIOVAN) 160 MG tablet Take 160 mg by mouth daily. 12/30/14  Yes Historical Provider, MD  naproxen (NAPROSYN) 375 MG tablet Take 1 tablet (375 mg total) by mouth 2 (two) times daily. 03/09/15   April Palumbo, MD  promethazine (PHENERGAN) 12.5 MG suppository Place 1 suppository (12.5 mg total) rectally every 8 (eight) hours as needed for refractory nausea / vomiting. 03/09/15   April Palumbo, MD  valACYclovir (VALTREX) 1000 MG tablet Take 1 tablet (1,000 mg total) by mouth 3 (three) times daily. 03/09/15 03/23/15  April Palumbo, MD    Allergies:   Allergies  Allergen Reactions  . Aspirin Other (See Comments)    REACTION: STOMACH ISSUES WITH DOSE HIGHER THAN 81 MG   . Diltiazem Hcl Rash  . Hydrocodone-Acetaminophen Nausea And Vomiting  . Morphine Nausea And Vomiting  . Penicillins Rash  . Neurontin [Gabapentin] Nausea And Vomiting    Social History:  reports that she has never smoked. She does not have any smokeless tobacco history on file. She reports that she does not drink alcohol or use illicit drugs. she ambulates without difficulty, stays at home with her family  Family History: Family history reviewed with the patient  Family History  Problem Relation Age of Onset  . Congestive Heart Failure Mother   . Tuberculosis Mother   . Lymphoma Mother   . Bone cancer Father   . Arthritis Father   . Breast cancer Sister   . Colon cancer Sister   .  Hypertension Sister   . Dementia Sister     Physical Exam: Blood pressure 168/65, pulse 73, temperature 97.7 F (36.5 C), temperature source Oral, resp. rate 13, height 5\' 4"  (1.626 m), weight 86.637 kg (191 lb), SpO2 99 %. General: Alert, awake, oriented x3, in no acute distress. HEENT: normocephalic, atraumatic, anicteric sclera, pink conjunctiva, pupils equal and reactive to light and accomodation, oropharynx clear Neck: supple, no masses or lymphadenopathy, no goiter, no bruits  Heart: Regular rate and rhythm, without murmurs, rubs or gallops. Lungs: Clear to auscultation bilaterally, no wheezing, rales or rhonchi. Abdomen: Soft, nontender, nondistended, positive bowel sounds, no masses. Extremities: No clubbing, cyanosis or edema with positive pedal pulses. Neuro: Grossly intact, no focal neurological deficits, strength 5/5 upper and lower extremities bilaterally Psych: alert and oriented x 3, normal mood and affect Skin: Patient noted to have vesicles on the right side of the forehead and extending into the hairline and downwards towards the nose consistent with shingles   LABS on Admission:  Basic Metabolic Panel:  Recent Labs  Lab 03/08/15 2153 03/09/15 1632  NA 136 133*  K 4.1 4.1  CL 104 99*  CO2 19* 24  GLUCOSE 127* 119*  BUN 24* 23*  CREATININE 1.03* 1.09*  CALCIUM 9.4 9.0   Liver Function Tests:  Recent Labs Lab 03/08/15 2153 03/09/15 1632  AST 26 25  ALT 16 15  ALKPHOS 50 54  BILITOT 0.7 0.8  PROT 6.9 6.7  ALBUMIN 4.5 4.2   No results for input(s): LIPASE, AMYLASE in the last 168 hours. No results for input(s): AMMONIA in the last 168 hours. CBC:  Recent Labs Lab 03/08/15 2153 03/09/15 1632  WBC 9.8 7.4  NEUTROABS 7.1 5.9  HGB 13.5 12.4  HCT 40.6 37.3  MCV 83.4 82.5  PLT 216 228   Cardiac Enzymes:  Recent Labs Lab 03/09/15 1632  TROPONINI 0.03   BNP: Invalid input(s): POCBNP CBG:  Recent Labs Lab 03/08/15 2328  GLUCAP 151*     Radiological Exams on Admission:  Dg Sinuses Complete  03/08/2015   CLINICAL DATA:  Right maxillary and frontal sinus region pain for 5 days  EXAM: PARANASAL SINUSES - COMPLETE 3 + VIEW  COMPARISON:  March 21, 2012  FINDINGS: Caldwell, water's, lateral, and submentovertex images were obtained. Paranasal sinuses are clear. There is no air-fluid level. No bony destruction or expansion. There is rightward deviation of the nasal septum.  IMPRESSION: Paranasal sinuses clear.  Deviated nasal septum.   Electronically Signed   By: Lowella Grip III M.D.   On: 03/08/2015 14:18   Ct Head Wo Contrast  03/08/2015   CLINICAL DATA:  RIGHT-sided headache beginning 4 days ago, diagnosis sinusitis. History of diabetes, hypertension and vertigo.  EXAM: CT HEAD WITHOUT CONTRAST  TECHNIQUE: Contiguous axial images were obtained from the base of the skull through the vertex without intravenous contrast.  COMPARISON:  CT head March 20, 2010 and sinus radiographs Mar 08, 2015 at 1354 hours  FINDINGS: The ventricles and sulci are normal for age. No intraparenchymal hemorrhage, mass effect nor midline shift. Patchy supratentorial white matter hypodensities are less than expected for patient's age and though non-specific suggest sequelae of chronic small vessel ischemic disease. No acute large vascular territory infarcts.  No abnormal extra-axial fluid collections. Basal cisterns are patent. Moderate calcific atherosclerosis of the carotid siphons.  No skull fracture. Hyperostosis totalis internus, including involvement of the petrous apex. The included ocular globes and orbital contents are non-suspicious. The mastoid aircells and included paranasal sinuses are well-aerated.  IMPRESSION: No acute intracranial process; normal noncontrast CT head for age.  No CT findings of paranasal sinusitis.   Electronically Signed   By: Elon Alas   On: 03/08/2015 23:41   Ct Cervical Spine Wo Contrast  02/23/2015   CLINICAL DATA:   Neck pain scratch the chronic neck pain. Right hand weakness and numbness.  EXAM: CT CERVICAL SPINE WITHOUT CONTRAST  TECHNIQUE: Multidetector CT imaging of the cervical spine was performed without intravenous contrast. Multiplanar CT image reconstructions were also generated.  COMPARISON:  None.  FINDINGS: Cervical spine is imaged from the skullbase through T2-3. Grade 1 anterolisthesis is present at C4-5. There is chronic loss of disc height at C5-6 and C6-7.  Moderate soft tissue surrounds the dens, suggesting inflammatory arthritis. The canal is preserved.  Soft tissues the neck demonstrate atherosclerotic calcifications at the carotid bifurcations bilaterally. The thyroid is normal. The lung apices are clear.  C2-3: Asymmetric right-sided uncovertebral and facet hypertrophy results in moderate right foraminal stenosis. There is partial  effacement of the ventral CSF.  C3-4: Asymmetric right-sided uncovertebral and facet hypertrophy results in moderate right foraminal stenosis. A rightward disc osteophyte complex effaces the ventral CSF. The left foramen is patent.  C4-5: A rightward disc osteophyte complex is present. Moderate facet hypertrophy is noted bilaterally. Moderate right and mild left foraminal narrowing is evident.  C5-6: A broad-based disc osteophyte complex effaces the ventral CSF. Mild to moderate foraminal narrowing is present bilaterally.  C6-7: A broad-based disc osteophyte complex partially effaces the ventral CSF. Mild foraminal narrowing is present bilaterally with uncovertebral and facet hypertrophy.  C7-T1: Moderate facet hypertrophy is present bilaterally. Grade 1 anterolisthesis is evident. There is no significant stenosis.  Chronic loss of disc height bilateral osseous foraminal narrowing is present at T1-2 bilaterally.  IMPRESSION: 1. Multilevel spondylosis of the cervical spine as described. 2. Asymmetric right-sided disease in the upper cervical spine at C2-3, C3-4, and C4-5. 3.  Disease is slightly worse on the left at C5-6 and C6-7.   Electronically Signed   By: San Morelle M.D.   On: 02/23/2015 13:58   Dg Chest Port 1 View  03/09/2015   CLINICAL DATA:  Chest pain  EXAM: PORTABLE CHEST - 1 VIEW  COMPARISON:  09/25/2012  FINDINGS: Cardiomediastinal silhouette is stable. Dual lead cardiac pacemaker with leads in right atrium and right ventricle. No acute infiltrate or pulmonary edema. Extensive degenerative changes bilateral shoulders. Degenerative changes thoracic spine.  IMPRESSION: No active disease. Degenerative changes bilateral shoulders and thoracic spine.   Electronically Signed   By: Lahoma Crocker M.D.   On: 03/09/2015 15:15    *I have personally reviewed the images above*  EKG: Independently reviewed. Rate 62, normal sinus rhythm, no acute ST-T wave changes suggestive of ischemia   Assessment/Plan Principal Problem:   Chest pain: Atypical, currently resolved, restart factors being hypertension and diabetes mellitus - Admit to telemetry, for observation, rule out acute ACS, obtain serial cardiac enzymes, lipid panel - Continue aspirin, Diovan, spironolactone - No beta blocker due to history of sick sinus syndrome/pacemaker - Obtain 2-D echocardiogram for further workup. Patient had a nuclear medicine stress test in 5/15, EF was 64% with normal LV wall motion, no definite evidence of ischemia   Active Problems:   Diabetes mellitus: Diet-controlled - Obtain hemoglobin A1c, place on sliding scale insulin while inpatient    Essential hypertension - Currently stable, continue amlodipine, spironolactone, Diovan    Sick sinus syndrome With bradycardia - Patient has pacemaker, her cardiologist is Dr. Rayann Heman - Pacemaker was interrogated on 4/20, normal functioning, no changes    Shingles -Patient has not started Valtrex 1gm x 14 days, patient reports that she cannot tolerate Neurontin - Placed on Lyrica 50mg  BID, fentanyl and oxycodone as needed     DVT prophylaxis: lovenox  CODE STATUS: full code  Family Communication: Admission, patients condition and plan of care including tests being ordered have been discussed with the patient's husband who indicates understanding and agree with the plan and Code Status  Disposition plan: Further plan will depend as patient's clinical course evolves and further radiologic and laboratory data become available. Likely home tomorrow if stable  Time Spent on Admission: 60 mins   Dotti Busey M.D. [facility] 03/09/2015, 6:14 PM Pager: [phone]  If 7PM-7AM, please contact night-coverage [email or web address] Password [email or web address]

## 2015-03-09 NOTE — ED Notes (Signed)
Reviewed discharge paperwork with patient and family, clarified primary care provider is Dr. Carlis Abbott. Spouse at bedside upset in regards to discharge follow up plan. Dr. Virgel Gess, reviewed same team of doctors, and changes follow up MD to Dr. Carlis Abbott as family request. New discharge paperwork provided.

## 2015-03-09 NOTE — Discharge Instructions (Signed)

## 2015-03-09 NOTE — ED Notes (Signed)
Per EMS- pt was seen here last night- dx with shingles in right eye. Pt was at PCP today for follow up, and reported non radiating left sided CP and associated SOB to him. PCP called EMS. Pt reports that CP started this AM when she woke up, EMS gave 324 ASA, 2 nitro PTA with some relief.

## 2015-03-09 NOTE — ED Notes (Signed)
Pt cannot go to 2W due to lack of negative pressure room.

## 2015-03-09 NOTE — ED Provider Notes (Signed)
D/w Dr. Tana Coast - will admit to observation.  Holding orders written.    Noemi Chapel, MD 03/09/15 [phone]

## 2015-03-09 NOTE — ED Notes (Signed)
MD Palumbo informed of lack of change in patient condition.

## 2015-03-10 ENCOUNTER — Observation Stay (HOSPITAL_BASED_OUTPATIENT_CLINIC_OR_DEPARTMENT_OTHER): Payer: Medicare HMO

## 2015-03-10 DIAGNOSIS — I10 Essential (primary) hypertension: Secondary | ICD-10-CM

## 2015-03-10 DIAGNOSIS — R079 Chest pain, unspecified: Secondary | ICD-10-CM

## 2015-03-10 DIAGNOSIS — B029 Zoster without complications: Secondary | ICD-10-CM | POA: Diagnosis not present

## 2015-03-10 DIAGNOSIS — B0230 Zoster ocular disease, unspecified: Secondary | ICD-10-CM

## 2015-03-10 DIAGNOSIS — I495 Sick sinus syndrome: Secondary | ICD-10-CM | POA: Diagnosis not present

## 2015-03-10 LAB — GLUCOSE, CAPILLARY
Glucose-Capillary: 175 mg/dL — ABNORMAL HIGH (ref 65–99)
Glucose-Capillary: 125 mg/dL — ABNORMAL HIGH (ref 65–99)

## 2015-03-10 LAB — TROPONIN I
Troponin I: 0.04 ng/mL — ABNORMAL HIGH (ref <0.031)
Troponin I: 0.05 ng/mL — ABNORMAL HIGH (ref <0.031)

## 2015-03-10 MED ORDER — PREDNISOLONE ACETATE 0.12 % OP SUSP: 1.0000 [drp] | OPHTHALMIC | Status: DC

## 2015-03-10 MED ORDER — PREDNISONE 20 MG PO TABS: 40.0000 mg | ORAL_TABLET | Freq: Every day | ORAL | Status: DC

## 2015-03-10 MED ORDER — PREDNISONE 20 MG PO TABS
40.0000 mg | ORAL_TABLET | Freq: Every day | ORAL | Status: DC
  Administered 2015-03-10: 40 mg via ORAL
  Filled 2015-03-10 (×2): qty 2

## 2015-03-10 MED ORDER — HYDROCHLOROTHIAZIDE 12.5 MG PO CAPS: 12.5000 mg | ORAL_CAPSULE | Freq: Every day | ORAL | Status: DC

## 2015-03-10 MED ORDER — PREDNISOLONE ACETATE 1 % OP SUSP: 1.0000 [drp] | Freq: Four times a day (QID) | OPHTHALMIC | Status: DC

## 2015-03-10 MED ORDER — PREDNISOLONE ACETATE 1 % OP SUSP
1.0000 [drp] | OPHTHALMIC | Status: DC
  Administered 2015-03-10 (×2): 1 [drp] via OPHTHALMIC
  Filled 2015-03-10: qty 1

## 2015-03-10 MED ORDER — POLYETHYLENE GLYCOL 3350 17 G PO PACK
17.0000 g | PACK | Freq: Every day | ORAL | Status: DC
  Filled 2015-03-10: qty 1

## 2015-03-10 NOTE — Discharge Summary (Signed)
Physician Discharge Summary  [name] [id number] DOB: 1930-03-10 DOA: 03/09/2015  PCP: Foye Spurling, MD  Admit date: 03/09/2015 Discharge date: 03/10/2015  Time spent: 40 minutes  Recommendations for Outpatient Follow-up:   1. Patient recommended close follow-up 03/12/15 Dr. Crecencio Mc ophthalmology 2.  to meds as below  Steroid-induced burst prednisone 40 mg 5 days no refill   Continue Valtrex as prior described  Prednisone acetate 1% every 4 hours per instruction Dr. Venetia Maxon  Recommended daily use of HCTZ rather than every other day/when necessary 3. Needs eventual outpatient monitoring from a cardiology perspective . Patient's heart issues were non-contributory to this hospital stay   Discharge Diagnoses:  Principal Problem:   Chest pain Active Problems:   Diabetes mellitus   Essential hypertension   Sick sinus syndrome   Shingles   Discharge Condition: stable  Diet recommendation: heart healthy low-salt   Filed Weights   03/09/15 1420 03/09/15 2017 03/10/15 0526  Weight: 86.637 kg (191 lb) 83.371 kg (183 lb 12.8 oz) 83.6 kg (184 lb 4.9 oz)   81 y/o ? htn, ty2 DM, recent  shingles-[she was diagnosed with this in the emergency room 5/23 and was supposed to follow up with her primary care physician as well as ophthalmologist Dr. Wilhemena Durie was given that prescription on 5/23 but did not take the Valtrex as yet], DM ty 2, OSA, Morbid obesity, Body mass index is 30.67 kg/(m^2)., H/o lumpectomy, DJD, SSS s/p dual chamber PPM St. Jude 02/05/09 [recent interrogation 02/03/15 ok] Admitted with chest pain, headache States that she had pain in her eyes for the past 1 week and went to see her ophthalmologist Dr. Venetia Maxon who advised that without any lesions or any other concerning findings for shingles, this may be related to sinusitis.  She has a family history of her mother having acute angle glaucoma with eventual loss of vision and patient became very  concerned about this. She called Dr. Gertie Exon office once again because of persisting pain in the frontal sinus and maxillary areas and was referred to see Dr. Araceli Bouche of ENT. Dr. Simeon Craft did not feel that this was consistent with sinusitis and patient ultimately went back to see her primary care physician Dr. Carlis Abbott after 1-2 nights of not sleeping.  Patient ultimately had chest pain on discussion with Dr. Carlis Abbott and was sent over to the emergency room. EKGs were non-concerning for ischemic event however troponin trended slightly upward with a flat trend from 0.03-->0.05 peak. As a result patient was kept in the hospital and repeat EKG done in the morning subsequent to admission on 5/25 showed no acute ischemic changes Echocardiogram showed no wall motion abnormalities but did reveal an EF of mild LVH , 60%   I had a long discussion with Dr. Venetia Maxon who advised that the patient should be treated as if this was ocular shingles given findings of the tip of her nose having lesions and patient was started on a burst of prednisone as above in addition to ocular prednisolone 1% every 4 hourly I stress the importance of continued medication use and close follow-up with Dr. Venetia Maxon who is aware of her admission   Telephone consulted Dr. Venetia Maxon  Discharge Exam: Filed Vitals:   03/10/15 0526  BP: 145/69  Pulse: 78  Temp: 98.8 F (37.1 C)  Resp: 16    General: Injected right eye, noted lesion above right eyebrow, noted rash over her right forehead as well as her line and tip of nose lesion pathognomic of  herpes zoster. Cardiovascular: S1-S2 no murmur rub or gallop  Respiratory: clinically clear no added sound No lower extremity edema no rash  Discharge Instructions   Discharge Instructions    Diet - low sodium heart healthy    Complete by:  As directed      Discharge instructions    Complete by:  As directed   Complete all yuor tablets of the Valtrex Complete 5 days of oral prednisone 40 mg  daily See Dr. Venetia Maxon Friday morning Follow with your cardiologist as an OP     Increase activity slowly    Complete by:  As directed           Current Discharge Medication List    START taking these medications   Details  prednisoLONE acetate (PRED FORTE) 1 % ophthalmic suspension Place 1 drop into the right eye 4 (four) times daily. Qty: 5 mL, Refills: 0    predniSONE (DELTASONE) 20 MG tablet Take 2 tablets (40 mg total) by mouth daily before breakfast. Qty: 10 tablet, Refills: 0      CONTINUE these medications which have CHANGED   Details  hydrochlorothiazide (MICROZIDE) 12.5 MG capsule Take 1 capsule (12.5 mg total) by mouth daily. Qty: 90 capsule, Refills: 3      CONTINUE these medications which have NOT CHANGED   Details  allopurinol (ZYLOPRIM) 100 MG tablet Take 100 mg by mouth daily.      amLODipine (NORVASC) 10 MG tablet Take 10 mg by mouth daily.    cetirizine (ZYRTEC) 10 MG tablet Take 10 mg by mouth daily. For allergies    glimepiride (AMARYL) 1 MG tablet Take 0.5 mg by mouth 2 (two) times daily.     latanoprost (XALATAN) 0.005 % ophthalmic solution Place 1 drop into both eyes at bedtime.    Multiple Vitamin (MULTIVITAMIN WITH MINERALS) TABS tablet Take 1 tablet by mouth daily.    spironolactone (ALDACTONE) 25 MG tablet Take 25 mg by mouth every other day.     valsartan (DIOVAN) 160 MG tablet Take 160 mg by mouth daily. Refills: 2    naproxen (NAPROSYN) 375 MG tablet Take 1 tablet (375 mg total) by mouth 2 (two) times daily. Qty: 20 tablet, Refills: 0    promethazine (PHENERGAN) 12.5 MG suppository Place 1 suppository (12.5 mg total) rectally every 8 (eight) hours as needed for refractory nausea / vomiting. Qty: 4 suppository, Refills: 0    valACYclovir (VALTREX) 1000 MG tablet Take 1 tablet (1,000 mg total) by mouth 3 (three) times daily. Qty: 21 tablet, Refills: 0      STOP taking these medications     doxycycline (VIBRA-TABS) 100 MG tablet          Allergies  Allergen Reactions  . Aspirin Other (See Comments)    REACTION: STOMACH ISSUES WITH DOSE HIGHER THAN 81 MG   . Diltiazem Hcl Rash  . Hydrocodone-Acetaminophen Nausea And Vomiting  . Morphine Nausea And Vomiting  . Penicillins Rash  . Neurontin [Gabapentin] Nausea And Vomiting      The results of significant diagnostics from this hospitalization (including imaging, microbiology, ancillary and laboratory) are listed below for reference.    Significant Diagnostic Studies: Dg Sinuses Complete  03/08/2015   CLINICAL DATA:  Right maxillary and frontal sinus region pain for 5 days  EXAM: PARANASAL SINUSES - COMPLETE 3 + VIEW  COMPARISON:  March 21, 2012  FINDINGS: Caldwell, water's, lateral, and submentovertex images were obtained. Paranasal sinuses are clear. There is no air-fluid level.  No bony destruction or expansion. There is rightward deviation of the nasal septum.  IMPRESSION: Paranasal sinuses clear.  Deviated nasal septum.   Electronically Signed   By: Lowella Grip III M.D.   On: 03/08/2015 14:18   Ct Head Wo Contrast  03/08/2015   CLINICAL DATA:  RIGHT-sided headache beginning 4 days ago, diagnosis sinusitis. History of diabetes, hypertension and vertigo.  EXAM: CT HEAD WITHOUT CONTRAST  TECHNIQUE: Contiguous axial images were obtained from the base of the skull through the vertex without intravenous contrast.  COMPARISON:  CT head March 20, 2010 and sinus radiographs Mar 08, 2015 at 1354 hours  FINDINGS: The ventricles and sulci are normal for age. No intraparenchymal hemorrhage, mass effect nor midline shift. Patchy supratentorial white matter hypodensities are less than expected for patient's age and though non-specific suggest sequelae of chronic small vessel ischemic disease. No acute large vascular territory infarcts.  No abnormal extra-axial fluid collections. Basal cisterns are patent. Moderate calcific atherosclerosis of the carotid siphons.  No skull fracture.  Hyperostosis totalis internus, including involvement of the petrous apex. The included ocular globes and orbital contents are non-suspicious. The mastoid aircells and included paranasal sinuses are well-aerated.  IMPRESSION: No acute intracranial process; normal noncontrast CT head for age.  No CT findings of paranasal sinusitis.   Electronically Signed   By: Elon Alas   On: 03/08/2015 23:41   Ct Cervical Spine Wo Contrast  02/23/2015   CLINICAL DATA:  Neck pain scratch the chronic neck pain. Right hand weakness and numbness.  EXAM: CT CERVICAL SPINE WITHOUT CONTRAST  TECHNIQUE: Multidetector CT imaging of the cervical spine was performed without intravenous contrast. Multiplanar CT image reconstructions were also generated.  COMPARISON:  None.  FINDINGS: Cervical spine is imaged from the skullbase through T2-3. Grade 1 anterolisthesis is present at C4-5. There is chronic loss of disc height at C5-6 and C6-7.  Moderate soft tissue surrounds the dens, suggesting inflammatory arthritis. The canal is preserved.  Soft tissues the neck demonstrate atherosclerotic calcifications at the carotid bifurcations bilaterally. The thyroid is normal. The lung apices are clear.  C2-3: Asymmetric right-sided uncovertebral and facet hypertrophy results in moderate right foraminal stenosis. There is partial effacement of the ventral CSF.  C3-4: Asymmetric right-sided uncovertebral and facet hypertrophy results in moderate right foraminal stenosis. A rightward disc osteophyte complex effaces the ventral CSF. The left foramen is patent.  C4-5: A rightward disc osteophyte complex is present. Moderate facet hypertrophy is noted bilaterally. Moderate right and mild left foraminal narrowing is evident.  C5-6: A broad-based disc osteophyte complex effaces the ventral CSF. Mild to moderate foraminal narrowing is present bilaterally.  C6-7: A broad-based disc osteophyte complex partially effaces the ventral CSF. Mild foraminal  narrowing is present bilaterally with uncovertebral and facet hypertrophy.  C7-T1: Moderate facet hypertrophy is present bilaterally. Grade 1 anterolisthesis is evident. There is no significant stenosis.  Chronic loss of disc height bilateral osseous foraminal narrowing is present at T1-2 bilaterally.  IMPRESSION: 1. Multilevel spondylosis of the cervical spine as described. 2. Asymmetric right-sided disease in the upper cervical spine at C2-3, C3-4, and C4-5. 3. Disease is slightly worse on the left at C5-6 and C6-7.   Electronically Signed   By: San Morelle M.D.   On: 02/23/2015 13:58   Dg Chest Port 1 View  03/09/2015   CLINICAL DATA:  Chest pain  EXAM: PORTABLE CHEST - 1 VIEW  COMPARISON:  09/25/2012  FINDINGS: Cardiomediastinal silhouette is stable. Dual lead cardiac  pacemaker with leads in right atrium and right ventricle. No acute infiltrate or pulmonary edema. Extensive degenerative changes bilateral shoulders. Degenerative changes thoracic spine.  IMPRESSION: No active disease. Degenerative changes bilateral shoulders and thoracic spine.   Electronically Signed   By: Lahoma Crocker M.D.   On: 03/09/2015 15:15    Microbiology: No results found for this or any previous visit (from the past 240 hour(s)).   Labs: Basic Metabolic Panel:  Recent Labs Lab 03/08/15 2153 03/09/15 1632  NA 136 133*  K 4.1 4.1  CL 104 99*  CO2 19* 24  GLUCOSE 127* 119*  BUN 24* 23*  CREATININE 1.03* 1.09*  CALCIUM 9.4 9.0   Liver Function Tests:  Recent Labs Lab 03/08/15 2153 03/09/15 1632  AST 26 25  ALT 16 15  ALKPHOS 50 54  BILITOT 0.7 0.8  PROT 6.9 6.7  ALBUMIN 4.5 4.2   No results for input(s): LIPASE, AMYLASE in the last 168 hours. No results for input(s): AMMONIA in the last 168 hours. CBC:  Recent Labs Lab 03/08/15 2153 03/09/15 1632  WBC 9.8 7.4  NEUTROABS 7.1 5.9  HGB 13.5 12.4  HCT 40.6 37.3  MCV 83.4 82.5  PLT 216 228   Cardiac Enzymes:  Recent Labs Lab  03/09/15 1632 03/09/15 2143 03/09/15 2315 03/10/15 0230  TROPONINI 0.03 0.03 0.04* 0.05*   BNP: BNP (last 3 results) No results for input(s): BNP in the last 8760 hours.  ProBNP (last 3 results) No results for input(s): PROBNP in the last 8760 hours.  CBG:  Recent Labs Lab 03/08/15 2328 03/10/15 0615 03/10/15 1117  GLUCAP 151* 175* 125*       Signed:  Nita Sells  [facility] 03/10/2015, 1:46 PM

## 2015-03-10 NOTE — Progress Notes (Signed)
Nutrition Brief Note  Patient identified on the Malnutrition Screening Tool (MST) Report  Wt Readings from Last 15 Encounters:  03/10/15 184 lb 4.9 oz (83.6 kg)  02/03/15 191 lb 12.8 oz (87 kg)  02/18/14 186 lb (84.369 kg)  01/30/14 192 lb (87.091 kg)  01/30/13 186 lb (84.369 kg)  09/25/11 200 lb (90.719 kg)  09/21/10 202 lb 8 oz (91.853 kg)  04/06/10 196 lb (88.905 kg)  12/01/09 188 lb (85.276 kg)  06/11/09 201 lb (91.173 kg)  01/27/09 199 lb (90.266 kg)  10/12/08 205 lb 8 oz (93.214 kg)  02/24/08 206 lb 6.1 oz (93.614 kg)    Body mass index is 30.67 kg/(m^2). Patient meets criteria for Obesity based on current BMI. Pt states she has been trying to lose weight by eating smaller portions. She reports losing 5-6 lbs in the past 6 months. She states her appetite is good and she usually eats 2 meals and a couple snacks daily.   Current diet order is Heart Healthy, patient is consuming approximately 50-100% of meals at this time. Labs and medications reviewed.   No nutrition interventions warranted at this time. If nutrition issues arise, please consult RD.   Pryor Ochoa RD, LDN Inpatient Clinical Dietitian Pager: [phone] After Hours Pager: [phone]

## 2015-03-10 NOTE — Progress Notes (Signed)
  Echocardiogram 2D Echocardiogram has been performed.  [name] 03/10/2015, 12:06 PM

## 2015-03-10 NOTE — Progress Notes (Signed)
Pt lying in the bed no chest pain at this time does c/o of right eye pain given tylenol.

## 2015-03-11 LAB — HEMOGLOBIN A1C
Hgb A1c MFr Bld: 5.7 % — ABNORMAL HIGH (ref 4.8–5.6)
Hgb A1c MFr Bld: 5.7 % — ABNORMAL HIGH (ref 4.8–5.6)
Mean Plasma Glucose: 117 mg/dL
Mean Plasma Glucose: 117 mg/dL

## 2015-05-05 ENCOUNTER — Ambulatory Visit (INDEPENDENT_AMBULATORY_CARE_PROVIDER_SITE_OTHER): Payer: Medicare HMO | Admitting: *Deleted

## 2015-05-05 DIAGNOSIS — I495 Sick sinus syndrome: Secondary | ICD-10-CM

## 2015-05-05 NOTE — Progress Notes (Signed)
Remote pacemaker transmission.   

## 2015-05-14 LAB — CUP PACEART REMOTE DEVICE CHECK
Battery Remaining Longevity: 19 mo
Battery Remaining Percentage: 19 %
Battery Voltage: 2.77 V
Brady Statistic AP VP Percent: 41 %
Brady Statistic AP VS Percent: 58 %
Brady Statistic AS VP Percent: 1 %
Brady Statistic AS VS Percent: 1 %
Brady Statistic RA Percent Paced: 99 %
Brady Statistic RV Percent Paced: 41 %
Date Time Interrogation Session: 20160720073816
Lead Channel Impedance Value: 380 Ohm
Lead Channel Impedance Value: 430 Ohm
Lead Channel Pacing Threshold Amplitude: 0.75 V
Lead Channel Pacing Threshold Amplitude: 1 V
Lead Channel Pacing Threshold Pulse Width: 0.4 ms
Lead Channel Pacing Threshold Pulse Width: 0.4 ms
Lead Channel Sensing Intrinsic Amplitude: 10.1 mV
Lead Channel Sensing Intrinsic Amplitude: 3.4 mV
Lead Channel Setting Pacing Amplitude: 2 V
Lead Channel Setting Pacing Amplitude: 2.5 V
Lead Channel Setting Pacing Pulse Width: 0.4 ms
Lead Channel Setting Sensing Sensitivity: 2 mV
Pulse Gen Model: 2210
Pulse Gen Serial Number: 2284731

## 2015-05-31 ENCOUNTER — Encounter: Payer: Self-pay | Admitting: Cardiology

## 2015-06-04 ENCOUNTER — Ambulatory Visit: Payer: Medicare HMO | Attending: Orthopaedic Surgery

## 2015-06-04 DIAGNOSIS — M6248 Contracture of muscle, other site: Secondary | ICD-10-CM | POA: Insufficient documentation

## 2015-06-04 DIAGNOSIS — M436 Torticollis: Secondary | ICD-10-CM | POA: Diagnosis present

## 2015-06-04 DIAGNOSIS — R293 Abnormal posture: Secondary | ICD-10-CM | POA: Diagnosis not present

## 2015-06-04 DIAGNOSIS — M542 Cervicalgia: Secondary | ICD-10-CM | POA: Diagnosis present

## 2015-06-04 DIAGNOSIS — M62838 Other muscle spasm: Secondary | ICD-10-CM

## 2015-06-04 NOTE — Patient Instructions (Signed)
From cabinet, postural education and exercise with chin tucks , scapula retraction  3-5x/day 3 reps 3-5 sec hold

## 2015-06-04 NOTE — Therapy (Signed)
[place], [place], [address] Phone: [phone]   Fax:  [phone]  Physical Therapy Evaluation  Patient Details  Name: [name] MRN: [id number] Date of Birth: 02/12/1930 Referring Provider:  Marybelle Killings, MD  Encounter Date: 06/04/2015      PT End of Session - 06/04/15 1116    Visit Number 1   Number of Visits 12   Date for PT Re-Evaluation 07/16/15   Authorization Type Medicare  KX visit 15   Authorization Time Period 06/04/15  to 07/16/15   Authorization - Visit Number 1   Authorization - Number of Visits 12   PT Start Time 1100   PT Stop Time 1150   PT Time Calculation (min) 50 min   Activity Tolerance Patient tolerated treatment well   Behavior During Therapy [facility] for tasks assessed/performed      Past Medical History  Diagnosis Date  . 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 HYPERTENSION 02/24/2008  . CAROTID BRUIT 02/24/2008  . DIABETES MELLITUS, TYPE II 02/24/2008  . Benign paroxysmal positional vertigo 04/06/2010    Past Surgical History  Procedure Laterality Date  . Pacemaker insertion      There were no vitals filed for this visit.  Visit Diagnosis:  Abnormal posture - Plan: PT plan of care cert/re-cert  Stiffness of neck - Plan: PT plan of care cert/re-cert  Muscle spasms of neck - Plan: PT plan of care cert/re-cert  Pain in neck - Plan: PT plan of care cert/re-cert      Subjective Assessment - 06/04/15 1157    Limitations House hold activities            [facility] PT Assessment - 06/04/15 1056    Assessment   Medical Diagnosis cervical spondylosis   Onset Date/Surgical Date --  Many years and worsening in past 6 months   Next MD Visit after PT   Prior Therapy Not that she can recall   Precautions   Precautions None   Restrictions   Weight Bearing Restrictions No   Balance Screen   Has the patient fallen in the past 6 months Yes   How many times? --   caught RT foot on tray table and LT foot on another object   Has the patient had a decrease in activity level because of a fear of falling?  No   Is the patient reluctant to leave their home because of a fear of falling?  No   Prior Function   Level of Independence Independent   Cognition   Overall Cognitive Status Within Functional Limits for tasks assessed   Observation/Other Assessments   Focus on Therapeutic Outcomes (FOTO)  63%   Posture/Postural Control   Posture Comments Rounde shoulders and forward head Increased thoracic kyphosis.    ROM / Strength   AROM / PROM / Strength AROM;Strength   AROM   AROM Assessment Site Cervical   Cervical Flexion 45   Cervical Extension 40   Cervical - Right Side Bend 30   Cervical - Left Side Bend 23   Cervical - Right Rotation 60   Cervical - Left Rotation 53   Strength   Overall Strength Comments WNL both upper extremities with shoulder abduction 4+/5 but with pain.    Ambulation/Gait   Gait Comments Walks without device. She uses can on long distance walking.  [place] Adult PT Treatment/Exercise - 06-11-2015 1056    Modalities   Modalities Moist Heat   Moist Heat Therapy   Number Minutes Moist Heat 15 Minutes   Moist Heat Location Cervical                PT Education - 06-11-15 1151    Education provided Yes   Education Details POC,  Posture ed and exercise   Person(s) Educated Patient   Methods Explanation;Demonstration;Tactile cues;Verbal cues;Handout   Comprehension Returned demonstration;Verbalized understanding          PT Short Term Goals - 06-11-15 1155    PT SHORT TERM GOAL #1   Title She will report 25% less pain during normal activity   Time 3   Period Weeks   Status New   PT SHORT TERM GOAL #2   Title She will be independent with inital HEP   Time 3   Period Weeks   Status New   PT SHORT TERM GOAL #3   Title She will improve active range to stop extending with rotation  and sidebending of neck   Time 3   Period Weeks   Status New           PT Long Term Goals - June 11, 2015 1159    PT LONG TERM GOAL #1   Title She will report independence with all HEP issued as of last visit   Time 6   Period Weeks   Status New   PT LONG TERM GOAL #2   Title She will improve cervical rotation to 60 degrees or more to ease pain with turning neck   Time 6   Period Weeks   Status New   PT LONG TERM GOAL #3   Title She will report pain more intermittnant   Time 6   Period Weeks   Status New   PT LONG TERM GOAL #4   Title She will report minimal (2-3/10 max) pain with home tasks including looking down for cleaning   Time 6   Period Weeks   Status New               Plan - June 11, 2015 1152    Clinical Impression Statement Ms Mottley presents with neck pain , abnormal posture and decreased neck ROM. She should improve with PT with decr pain and improved comfort with ADL's . S   Pt will benefit from skilled therapeutic intervention in order to improve on the following deficits Decreased range of motion;Increased muscle spasms;Postural dysfunction;Pain;Decreased activity tolerance   Rehab Potential Good   PT Frequency 2x / week   PT Duration 6 weeks   PT Treatment/Interventions Moist Heat;Traction;Ultrasound;Therapeutic exercise;Patient/family education;Manual techniques;Taping;Dry needling;Passive range of motion   PT Next Visit Plan MAnual techniques , review posture exer, modalities   PT Home Exercise Plan psotureal exercise and awareness   Consulted and Agree with Plan of Care Patient          G-Codes - 2015/06/11 January 13, 1201    Functional Assessment Tool Used FOTO   Functional Limitation Changing and maintaining body position   Changing and Maintaining Body Position Current Status [phone]) At least 60 percent but less than 80 percent impaired, limited or restricted   Changing and Maintaining Body Position Goal Status [id number]) At least 40 percent but less than 60  percent impaired, limited or restricted       Problem List Patient Active Problem List   Diagnosis Date Noted  . Chest pain 03/09/2015  .  Shingles 03/09/2015  . Sick sinus syndrome 02/03/2015  . Shortness of breath 02/01/2014  . Fatigue 02/01/2014  . BENIGN PAROXYSMAL POSITIONAL VERTIGO 04/06/2010  . BRADYCARDIA 01/27/2009  . Diabetes mellitus 02/24/2008  . OBSTRUCTIVE SLEEP APNEA 02/24/2008  . 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 Essential hypertension 02/24/2008  . ALLERGIC RHINITIS 02/24/2008  . CAROTID BRUIT 02/24/2008    Darrel Hoover PT 06/04/2015, 12:05 PM  [facility] [address] [place], [place], [address] Phone: [phone]   Fax:  [phone]

## 2015-06-08 ENCOUNTER — Ambulatory Visit: Payer: Medicare HMO | Admitting: Physical Therapy

## 2015-06-08 ENCOUNTER — Encounter: Payer: Self-pay | Admitting: Internal Medicine

## 2015-06-08 DIAGNOSIS — M436 Torticollis: Secondary | ICD-10-CM

## 2015-06-08 DIAGNOSIS — M62838 Other muscle spasm: Secondary | ICD-10-CM

## 2015-06-08 DIAGNOSIS — M542 Cervicalgia: Secondary | ICD-10-CM

## 2015-06-08 DIAGNOSIS — R293 Abnormal posture: Secondary | ICD-10-CM | POA: Diagnosis not present

## 2015-06-08 NOTE — Therapy (Addendum)
[place], [place], [address] Phone: [phone]   Fax:  [phone]  Physical Therapy Treatment  Patient Details  Name: [name] MRN: [id number] Date of Birth: Sep 27, 1930 Referring Provider:  Foye Spurling, MD  Encounter Date: 06/08/2015      PT End of Session - 06/08/15 1628    Visit Number 2   Number of Visits 12   Date for PT Re-Evaluation 07/16/15   PT Start Time 1147   PT Stop Time 1856   PT Time Calculation (min) 48 min   Activity Tolerance Patient tolerated treatment well;No increased pain   Behavior During Therapy [facility] for tasks assessed/performed      Past Medical History  Diagnosis Date  . 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 HYPERTENSION 02/24/2008  . CAROTID BRUIT 02/24/2008  . DIABETES MELLITUS, TYPE II 02/24/2008  . Benign paroxysmal positional vertigo 04/06/2010    Past Surgical History  Procedure Laterality Date  . Pacemaker insertion      There were no vitals filed for this visit.  Visit Diagnosis:  Abnormal posture  Stiffness of neck  Muscle spasms of neck  Pain in neck      Subjective Assessment - 06/08/15 1535    Subjective Has a HA.  Does her home exercises.  Wants soft tissue work in chair vs bed hard to get prone.     Currently in Pain? Yes   Pain Score 5    Pain Location Neck   Pain Orientation Posterior;Right   Pain Descriptors / Indicators Aching;Tingling   Pain Type Chronic pain   Pain Radiating Towards RT   Pain Frequency Constant   Aggravating Factors  reclining in her recliner   Pain Relieving Factors supported sitting.   Multiple Pain Sites No                         [facility] Adult PT Treatment/Exercise - 06/08/15 1150    Neck Exercises: Seated   Neck Retraction 10 reps  5 second holds   Shoulder Exercises: Seated   Retraction 10 reps  5 seconds holding.  Cues   Retraction Limitations Deep neck flexor strengthening. 5  reps 5 second holds.    Manual Therapy   Manual Therapy Soft tissue mobilization   Manual therapy comments neck and upper back soft tissue work with trigger point release followed by gentle stretches to pecs at shoulders, levator twith strumming and upper trap strumming to lengthen   Neck Exercises: Stretches   Upper Trapezius Stretch 5 reps   Upper Trapezius Stretch Limitations 2 sets.  one with holding chair.     Levator Stretch 5 reps   Other Neck Stretches headach stretch with chin on fist , other hnnd stretching head into flexion.  able to ease HA some.                  PT Education - 06/08/15 1627    Education provided Yes   Education Details posture ed continued, stretches   Person(s) Educated Patient   Methods Explanation;Demonstration;Tactile cues;Verbal cues   Comprehension Verbalized understanding;Returned demonstration          PT Short Term Goals - 06/08/15 1630    PT SHORT TERM GOAL #1   Title She will report 25% less pain during normal activity   Time 3   Period Weeks   Status On-going   PT SHORT TERM GOAL #2  Title She will be independent with inital HEP   Baseline a few cued needed   Time 3   Period Weeks   Status On-going   PT SHORT TERM GOAL #3   Title She will improve active range to stop extending with rotation and sidebending of neck   Time 3   Period Weeks   Status On-going           PT Long Term Goals - 06/04/15 1159    PT LONG TERM GOAL #1   Title She will report independence with all HEP issued as of last visit   Time 6   Period Weeks   Status New   PT LONG TERM GOAL #2   Title She will improve cervical rotation to 60 degrees or more to ease pain with turning neck   Time 6   Period Weeks   Status New   PT LONG TERM GOAL #3   Title She will report pain more intermittnant   Time 6   Period Weeks   Status New   PT LONG TERM GOAL #4   Title She will report minimal (2-3/10 max) pain with home tasks including looking down for  cleaning   Time 6   Period Weeks   Status New               Plan - 06/08/15 1628    Clinical Impression Statement Posture improves post session.  Upper traps with less tension.  She still had a headach originating at base of skull tho it was eased some.   PT Next Visit Plan MAnual techniques , review posture exer, modalities, consider decompression exercises.   PT Home Exercise Plan retraction. deep neck, stretches neck   Consulted and Agree with Plan of Care Patient        Problem List Patient Active Problem List   Diagnosis Date Noted  . Chest pain 03/09/2015  . Shingles 03/09/2015  . Sick sinus syndrome 02/03/2015  . Shortness of breath 02/01/2014  . Fatigue 02/01/2014  . BENIGN PAROXYSMAL POSITIONAL VERTIGO 04/06/2010  . BRADYCARDIA 01/27/2009  . Diabetes mellitus 02/24/2008  . OBSTRUCTIVE SLEEP APNEA 02/24/2008  . 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 Essential hypertension 02/24/2008  . ALLERGIC RHINITIS 02/24/2008  . CAROTID BRUIT 02/24/2008    HARRIS,KAREN 06/08/2015, 4:32 PM  [facility] [address] [place], [place], [address] Phone: [phone]   Fax:  [phone]     Melvenia Needles, PTA 06/08/2015 4:32 PM Phone: [phone] Fax: [phone] PHYSICAL THERAPY DISCHARGE SUMMARY  Visits from Start of Care: 7  Current functional level related to goals / functional outcomes: Unknown   Remaining deficits: Unknown   Education / Equipment: HEP Plan:                                                    Patient goals were not met. Patient is being discharged due to not returning since the last visit.  ?????   Darrel Hoover, PT   08/31/15                                   2:39 PM

## 2015-06-15 ENCOUNTER — Ambulatory Visit: Payer: Medicare HMO | Admitting: Physical Therapy

## 2015-06-15 DIAGNOSIS — R293 Abnormal posture: Secondary | ICD-10-CM

## 2015-06-15 DIAGNOSIS — M542 Cervicalgia: Secondary | ICD-10-CM

## 2015-06-15 DIAGNOSIS — M62838 Other muscle spasm: Secondary | ICD-10-CM

## 2015-06-15 DIAGNOSIS — M436 Torticollis: Secondary | ICD-10-CM

## 2015-06-15 NOTE — Therapy (Signed)
[place], [place], [address] Phone: [phone]   Fax:  [phone]  Physical Therapy Treatment  Patient Details  Name: LUCEE BRISSETT MRN: [id number] Date of Birth: 07-22-1930 Referring Provider:  Foye Spurling, MD  Encounter Date: 06/15/2015      PT End of Session - 06/15/15 1814    Visit Number 3   Number of Visits 12   PT Start Time 1102   PT Stop Time 1203   PT Time Calculation (min) 61 min   Activity Tolerance Patient tolerated treatment well   Behavior During Therapy [facility] for tasks assessed/performed      Past Medical History  Diagnosis Date  . 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 HYPERTENSION 02/24/2008  . CAROTID BRUIT 02/24/2008  . DIABETES MELLITUS, TYPE II 02/24/2008  . Benign paroxysmal positional vertigo 04/06/2010    Past Surgical History  Procedure Laterality Date  . Pacemaker insertion      There were no vitals filed for this visit.  Visit Diagnosis:  Stiffness of neck  Abnormal posture  Muscle spasms of neck  Pain in neck      Subjective Assessment - 06/15/15 1102    Subjective No headach. Back 8/10 long history  of back pain .  Tylenol helps her back pain.   Has been doing the neck exercises.  Needs to refer to her written exercises to get them done.  Catch is gone in the neck.  No change in numbness of hand.      Currently in Pain? Yes   Pain Score 4    Pain Location Neck   Pain Orientation Right;Posterior   Pain Descriptors / Indicators Aching;Tingling   Pain Radiating Towards Rt hand    Pain Frequency Constant   Aggravating Factors  Sleeping without support.  Leaning over to wipe something off floor.     Pain Relieving Factors exercises?  Support of pillow in chair readdly helps.  Sitting with good posture helps a lot.    Multiple Pain Sites --  Back pain above, we are no treating.                         [facility] Adult PT  Treatment/Exercise - 06/15/15 1111    Neck Exercises: Seated   Neck Retraction --   Lateral Flexion 5 reps   Other Seated Exercise shoulder retracytion 5 reps   Neck Exercises: Supine   Neck Retraction --  Neck retraction 10X, deep neck flexion 10 reps. soreness mid   Cervical Rotation --  WNL today 5 reps supine an sitting   Other Supine Exercise decompression 5 minutes pillow under RT arm. shoulder press 10 reps, head press 10 reps, lwg lengthener, leg press 10 X each    Other Supine Exercise band white ER 15 reps stopped , narrow grip 5 reps, stopped due to hand pain.     Shoulder Exercises: Seated   Retraction 10 reps  5 seconds holding.  Cues   Retraction Limitations Deep neck flexor strengthening. 5 reps 5 second holds.    Moist Heat Therapy   Number Minutes Moist Heat 15 Minutes   Moist Heat Location Cervical                  PT Short Term Goals - 06/15/15 1815    PT SHORT TERM GOAL #1   Title She will report 25% less pain during normal activity  Baseline intermittantly improved   Time 3   Period Weeks   Status On-going   PT SHORT TERM GOAL #2   Title She will be independent with inital HEP   Baseline independent with written    Time 3   Period Weeks   Status Achieved   PT SHORT TERM GOAL #3   Title She will improve active range to stop extending with rotation and sidebending of neck   Baseline Rotation WNL   Time 3   Period Weeks   Status Partially Met           PT Long Term Goals - 06/04/15 1159    PT LONG TERM GOAL #1   Title She will report independence with all HEP issued as of last visit   Time 6   Period Weeks   Status New   PT LONG TERM GOAL #2   Title She will improve cervical rotation to 60 degrees or more to ease pain with turning neck   Time 6   Period Weeks   Status New   PT LONG TERM GOAL #3   Title She will report pain more intermittnant   Time 6   Period Weeks   Status New   PT LONG TERM GOAL #4   Title She will report  minimal (2-3/10 max) pain with home tasks including looking down for cleaning   Time 6   Period Weeks   Status New               Plan - 06/15/15 1814    Clinical Impression Statement No headach.  Rotation goal met today.  Continue posture strengthening.   PT Next Visit Plan MAnual techniques if needed,  chech lateral flexion. , review posture exer, modalities, continue decompression exercises.   Consulted and Agree with Plan of Care Patient        Problem List Patient Active Problem List   Diagnosis Date Noted  . Chest pain 03/09/2015  . Shingles 03/09/2015  . Sick sinus syndrome 02/03/2015  . Shortness of breath 02/01/2014  . Fatigue 02/01/2014  . BENIGN PAROXYSMAL POSITIONAL VERTIGO 04/06/2010  . BRADYCARDIA 01/27/2009  . Diabetes mellitus 02/24/2008  . OBSTRUCTIVE SLEEP APNEA 02/24/2008  . 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 Essential hypertension 02/24/2008  . ALLERGIC RHINITIS 02/24/2008  . CAROTID BRUIT 02/24/2008    Kamauri Denardo 06/15/2015, 6:17 PM  [facility] [address] [place], [place], [address] Phone: [phone]   Fax:  [phone]     Melvenia Needles, PTA 06/15/2015 6:17 PM Phone: [phone] Fax: [phone]

## 2015-06-18 ENCOUNTER — Encounter (HOSPITAL_COMMUNITY): Payer: Self-pay | Admitting: Emergency Medicine

## 2015-06-18 ENCOUNTER — Emergency Department (HOSPITAL_COMMUNITY)
Admission: EM | Admit: 2015-06-18 | Discharge: 2015-06-18 | Disposition: A | Discharge status: Home / Self Care | Payer: Medicare HMO | Attending: Emergency Medicine | Admitting: Emergency Medicine

## 2015-06-18 ENCOUNTER — Ambulatory Visit: Payer: Medicare HMO

## 2015-06-18 DIAGNOSIS — Z79899 Other long term (current) drug therapy: Secondary | ICD-10-CM | POA: Diagnosis not present

## 2015-06-18 DIAGNOSIS — H8110 Benign paroxysmal vertigo, unspecified ear: Secondary | ICD-10-CM | POA: Diagnosis not present

## 2015-06-18 DIAGNOSIS — Z880 Allergy status to penicillin: Secondary | ICD-10-CM | POA: Insufficient documentation

## 2015-06-18 DIAGNOSIS — R1110 Vomiting, unspecified: Secondary | ICD-10-CM | POA: Diagnosis present

## 2015-06-18 DIAGNOSIS — E119 Type 2 diabetes mellitus without complications: Secondary | ICD-10-CM | POA: Insufficient documentation

## 2015-06-18 DIAGNOSIS — I10 Essential (primary) hypertension: Secondary | ICD-10-CM | POA: Insufficient documentation

## 2015-06-18 LAB — BASIC METABOLIC PANEL
Anion gap: 9 (ref 5–15)
BUN: 24 mg/dL — ABNORMAL HIGH (ref 6–20)
CO2: 24 mmol/L (ref 22–32)
Calcium: 9.2 mg/dL (ref 8.9–10.3)
Chloride: 104 mmol/L (ref 101–111)
Creatinine, Ser: 0.82 mg/dL (ref 0.44–1.00)
GFR calc Af Amer: >60 mL/min (ref >60)
GFR calc non Af Amer: >60 mL/min (ref >60)
Glucose, Bld: 140 mg/dL — ABNORMAL HIGH (ref 65–99)
Potassium: 4.1 mmol/L (ref 3.5–5.1)
Sodium: 137 mmol/L (ref 135–145)

## 2015-06-18 LAB — CBC WITH DIFFERENTIAL/PLATELET
Basophils Absolute: 0 10*3/uL (ref 0.0–0.1)
Basophils Relative: 0 % (ref 0–1)
Eosinophils Absolute: 0 10*3/uL (ref 0.0–0.7)
Eosinophils Relative: 0 % (ref 0–5)
HCT: 36 % (ref 36.0–46.0)
Hemoglobin: 11.7 g/dL — ABNORMAL LOW (ref 12.0–15.0)
Lymphocytes Relative: 15 % (ref 12–46)
Lymphs Abs: 1 10*3/uL (ref 0.7–4.0)
MCH: 27.7 pg (ref 26.0–34.0)
MCHC: 32.5 g/dL (ref 30.0–36.0)
MCV: 85.1 fL (ref 78.0–100.0)
Monocytes Absolute: 0.2 10*3/uL (ref 0.1–1.0)
Monocytes Relative: 2 % — ABNORMAL LOW (ref 3–12)
Neutro Abs: 5.3 10*3/uL (ref 1.7–7.7)
Neutrophils Relative %: 83 % — ABNORMAL HIGH (ref 43–77)
Platelets: 221 10*3/uL (ref 150–400)
RBC: 4.23 MIL/uL (ref 3.87–5.11)
RDW: 14 % (ref 11.5–15.5)
WBC: 6.5 10*3/uL (ref 4.0–10.5)

## 2015-06-18 MED ORDER — MECLIZINE HCL 25 MG PO TABS
25.0000 mg | ORAL_TABLET | Freq: Once | ORAL | Status: AC
  Administered 2015-06-18: 25 mg via ORAL
  Filled 2015-06-18: qty 1

## 2015-06-18 MED ORDER — SODIUM CHLORIDE 0.9 % IV BOLUS (SEPSIS)
1000.0000 mL | Freq: Once | INTRAVENOUS | Status: AC
  Administered 2015-06-18: 1000 mL via INTRAVENOUS

## 2015-06-18 NOTE — ED Notes (Signed)
From home via EMS, c/o N/V, no diarrhea, vertigo, dizzy, CBG 154, VSS, A/O X4, NAD

## 2015-06-18 NOTE — Discharge Instructions (Signed)
Benign Positional Vertigo Vertigo means you feel like you or your surroundings are moving when they are not. Benign positional vertigo is the most common form of vertigo. Benign means that the cause of your condition is not serious. Benign positional vertigo is more common in older adults. CAUSES  Benign positional vertigo is the result of an upset in the labyrinth system. This is an area in the middle ear that helps control your balance. This may be caused by a viral infection, head injury, or repetitive motion. However, often no specific cause is found. SYMPTOMS  Symptoms of benign positional vertigo occur when you move your head or eyes in different directions. Some of the symptoms may include:  Loss of balance and falls.  Vomiting.  Blurred vision.  Dizziness.  Nausea.  Involuntary eye movements (nystagmus). DIAGNOSIS  Benign positional vertigo is usually diagnosed by physical exam. If the specific cause of your benign positional vertigo is unknown, your caregiver may perform imaging tests, such as magnetic resonance imaging (MRI) or computed tomography (CT). TREATMENT  Your caregiver may recommend movements or procedures to correct the benign positional vertigo. Medicines such as meclizine, benzodiazepines, and medicines for nausea may be used to treat your symptoms. In rare cases, if your symptoms are caused by certain conditions that affect the inner ear, you may need surgery. HOME CARE INSTRUCTIONS   Follow your caregiver's instructions.  Move slowly. Do not make sudden body or head movements.  Avoid driving.  Avoid operating heavy machinery.  Avoid performing any tasks that would be dangerous to you or others during a vertigo episode.  Drink enough fluids to keep your urine clear or pale yellow. SEEK IMMEDIATE MEDICAL CARE IF:   You develop problems with walking, weakness, numbness, or using your arms, hands, or legs.  You have difficulty speaking.  You develop  severe headaches.  Your nausea or vomiting continues or gets worse.  You develop visual changes.  Your family or friends notice any behavioral changes.  Your condition gets worse.  You have a fever.  You develop a stiff neck or sensitivity to light. MAKE SURE YOU:   Understand these instructions.  Will watch your condition.  Will get help right away if you are not doing well or get worse. Document Released: 07/10/2006 Document Revised: 12/25/2011 Document Reviewed: 06/22/2011 [facility] Patient Information 2015 [place], [place]. This information is not intended to replace advice given to you by your health care provider. Make sure you discuss any questions you have with your health care provider.  Please use previously prescribed medication as needed for symptoms. If new or worsening symptoms present please return immediately to the emergency room for further evaluation. Please contact her primary care provider and request follow-up evaluation as soon as possible.

## 2015-06-18 NOTE — ED Notes (Signed)
Bed: [id number] Expected date:  Expected time:  Means of arrival:  Comments: EMS- 81yo F, vertigo/dizziness

## 2015-06-18 NOTE — ED Provider Notes (Signed)
CSN: [id number]     Arrival date & time 06/18/15  41 History   First MD Initiated Contact with Patient 06/18/15 1717     Chief Complaint  Patient presents with  . Emesis   HPI   81 year old female with a significant past medical history of BPD presents today with dizziness, nausea, vomiting. Patient reports that this morning when putting her eyedrops and she went to lay back on her bed and had an episode of what she describes as vertigo. She reports she has not had this in over a year, but states its identical to previous presentations. She describes dizziness " as if the room was spinning" with associated nausea. She reports that closing her eyes and relaxing on the bed and improved symptoms but she still maintained some dizziness. She reports after trying to get up to go to the bathroom she felt unsteady on her feet, had severe dizziness and vomited once. She reports movement decrease his symptoms. Patient reports that since being diagnosed with shingles in her right eye she's had a baseline morning headache, she notes that this was present this morning, no more severe than previous. Patient reports she's been feeling well otherwise no upper respiratory symptoms, no head trauma, no changes in vision, smell, taste. 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 Patient reports she has meclizine at home but threw up shortly after taking and is uncertain if it was digested. Patient notes that EMS gave her Zofran in the ambulance that improved her nausea slightly. At the time my evaluation patient reports minimal symptoms, worse with head movements.  Past Medical History  Diagnosis Date  . 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 HYPERTENSION 02/24/2008  . CAROTID BRUIT 02/24/2008  . DIABETES MELLITUS, TYPE II 02/24/2008  . Benign paroxysmal  positional vertigo 04/06/2010   Past Surgical History  Procedure Laterality Date  . Pacemaker insertion     Family History  Problem Relation Age of Onset  . Congestive Heart Failure Mother   . Tuberculosis Mother   . Lymphoma Mother   . Bone cancer Father   . Arthritis Father   . Breast cancer Sister   . Colon cancer Sister   . Hypertension Sister   . Dementia Sister    Social History  Substance Use Topics  . Smoking status: Never Smoker   . Smokeless tobacco: None  . Alcohol Use: No   OB History    No data available     Review of Systems  All other systems reviewed and are negative.   Allergies  Aspirin; Diltiazem hcl; Hydrocodone-acetaminophen; Morphine; Penicillins; and Neurontin  Home Medications   Prior to Admission medications   Medication Sig Start Date End Date Taking? Authorizing Provider  allopurinol (ZYLOPRIM) 100 MG tablet Take 100 mg by mouth daily.     Yes Historical Provider, MD  amLODipine (NORVASC) 10 MG tablet Take 10 mg by mouth daily.   Yes Historical Provider, MD  AZOPT 1 % ophthalmic suspension Place 1 drop into the right eye 2 (two) times daily. 04/30/15  Yes Historical Provider, MD  cetirizine (ZYRTEC) 10 MG tablet Take 10 mg by mouth daily. For allergies   Yes Historical Provider, MD  glimepiride (AMARYL) 1 MG tablet Take 0.5 mg by mouth 2 (two) times daily.    Yes Historical Provider, MD  hydrochlorothiazide (MICROZIDE)  12.5 MG capsule Take 1 capsule (12.5 mg total) by mouth daily. 03/10/15  Yes Nita Sells, MD  latanoprost (XALATAN) 0.005 % ophthalmic solution Place 1 drop into both eyes at bedtime. 08/28/11  Yes Historical Provider, MD  LYRICA 50 MG capsule Take 1 capsule by mouth 2 (two) times daily. 06/14/15  Yes Historical Provider, MD  meclizine (ANTIVERT) 25 MG tablet Take 25 mg by mouth daily as needed for dizziness.   Yes Historical Provider, MD  Multiple Vitamin (MULTIVITAMIN WITH MINERALS) TABS tablet Take 1 tablet by mouth  daily.   Yes Historical Provider, MD  naproxen (NAPROSYN) 375 MG tablet Take 1 tablet (375 mg total) by mouth 2 (two) times daily. Patient taking differently: Take 375 mg by mouth 2 (two) times daily as needed for moderate pain.  03/09/15  Yes April Palumbo, MD  prednisoLONE acetate (PRED FORTE) 1 % ophthalmic suspension Place 1 drop into the right eye 4 (four) times daily. Patient taking differently: Place 1 drop into the right eye 2 (two) times daily.  03/10/15  Yes Nita Sells, MD  Pyridoxine HCl (VITAMIN B-6 PO) Take 1 tablet by mouth daily.   Yes Historical Provider, MD  spironolactone (ALDACTONE) 25 MG tablet Take 25 mg by mouth every other day.    Yes Historical Provider, MD  valsartan (DIOVAN) 160 MG tablet Take 160 mg by mouth daily. 12/30/14  Yes Historical Provider, MD  vitamin C (ASCORBIC ACID) 250 MG tablet Take 500 mg by mouth daily.   Yes Historical Provider, MD   BP 147/54 mmHg  Pulse 70  Temp(Src) 97.6 F (36.4 C) (Oral)  Resp 20  Ht 5\' 5"  (1.651 m)  Wt 185 lb (83.915 kg)  BMI 30.79 kg/m2  SpO2 99%   Physical Exam  Constitutional: She is oriented to person, place, and time. She appears well-developed and well-nourished.  HENT:  Head: Normocephalic and atraumatic.  Eyes: Conjunctivae and EOM are normal. Pupils are equal, round, and reactive to light. Right eye exhibits no discharge. Left eye exhibits no discharge. No scleral icterus. Right eye exhibits normal extraocular motion and no nystagmus. Left eye exhibits normal extraocular motion and no nystagmus.  Neck: Normal range of motion. No JVD present. No tracheal deviation present.  Pulmonary/Chest: Effort normal. No stridor.  Neurological: She is alert and oriented to person, place, and time. She has normal strength. She displays no atrophy and no tremor. No cranial nerve deficit or sensory deficit. She exhibits normal muscle tone. She displays a negative Romberg sign. She displays no seizure activity. Coordination  and gait normal. GCS eye subscore is 4. GCS verbal subscore is 5. GCS motor subscore is 6.  Psychiatric: She has a normal mood and affect. Her behavior is normal. Judgment and thought content normal.  Nursing note and vitals reviewed.   ED Course  Procedures (including critical care time) Labs Review Labs Reviewed  BASIC METABOLIC PANEL - Abnormal; Notable for the following:    Glucose, Bld 140 (*)    BUN 24 (*)    All other components within normal limits  CBC WITH DIFFERENTIAL/PLATELET - Abnormal; Notable for the following:    Hemoglobin 11.7 (*)    Neutrophils Relative % 83 (*)    Monocytes Relative 2 (*)    All other components within normal limits    Imaging Review No results found. I have personally reviewed and evaluated these images and lab results as part of my medical decision-making.   EKG Interpretation None  MDM   Final diagnoses:  BPV (benign positional vertigo), unspecified laterality    Labs: BMP, CBC- no significant findings  Imaging:   Consults:  Therapeutics: Meclizine  Discharge Meds:   Assessment/Plan: Patient's presentation most likely represents vertigo. She has a history of the same, improved with rest, meclizine improved symptoms as well. Patient has no other findings that would indicate this is a central cause. Patient ambulatory without difficulty, she had no episodes of vomiting or significant dizziness after the meclizine. She states discharged home with strict return precautions, encouraged follow-up with her primary care for further evaluation and management. And her husband understood and agreed to today's plan and had no further questions or concerns at time of discharge. Patient reports that she does have a prescription for meclizine at home.         Okey Regal, PA-C 06/19/15 [place], DO 06/22/15 1737

## 2015-06-22 ENCOUNTER — Ambulatory Visit: Payer: Medicare HMO | Admitting: Physical Therapy

## 2015-06-25 ENCOUNTER — Ambulatory Visit: Payer: Medicare HMO

## 2015-06-29 ENCOUNTER — Ambulatory Visit: Payer: Medicare HMO | Attending: Orthopaedic Surgery

## 2015-06-29 DIAGNOSIS — M6248 Contracture of muscle, other site: Secondary | ICD-10-CM | POA: Diagnosis present

## 2015-06-29 DIAGNOSIS — R293 Abnormal posture: Secondary | ICD-10-CM | POA: Diagnosis present

## 2015-06-29 DIAGNOSIS — M436 Torticollis: Secondary | ICD-10-CM | POA: Insufficient documentation

## 2015-06-29 DIAGNOSIS — M62838 Other muscle spasm: Secondary | ICD-10-CM

## 2015-06-29 DIAGNOSIS — M542 Cervicalgia: Secondary | ICD-10-CM

## 2015-06-29 NOTE — Therapy (Signed)
[place], [place], [address] Phone: [phone]   Fax:  [phone]  Physical Therapy Evaluation  Patient Details  Name: [name] MRN: [id number] Date of Birth: [birth year] Referring Provider:  Foye Spurling, MD  Encounter Date: 06/29/2015      PT End of Session - 06/29/15 1314    Visit Number 4   Date for PT Re-Evaluation 07/16/15   PT Start Time 1232   PT Stop Time 0130   PT Time Calculation (min) 778 min   Activity Tolerance Patient tolerated treatment well   Behavior During Therapy [facility] for tasks assessed/performed      Past Medical History  Diagnosis Date  . 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 HYPERTENSION 02/24/2008  . CAROTID BRUIT 02/24/2008  . DIABETES MELLITUS, TYPE II 02/24/2008  . Benign paroxysmal positional vertigo 04/06/2010    Past Surgical History  Procedure Laterality Date  . Pacemaker insertion      There were no vitals filed for this visit.  Visit Diagnosis:  Stiffness of neck  Abnormal posture  Muscle spasms of neck  Pain in neck      Subjective Assessment - 06/29/15 1243    Subjective Missed some visits due to vertigo. Neck pain is better . Its there but better.    Currently in Pain? Yes   Pain Score 3    Pain Location Neck   Pain Orientation Right;Left;Posterior   Pain Descriptors / Indicators Sore  stiff   Pain Type Chronic pain   Pain Onset More than a month ago   Pain Frequency Constant   Aggravating Factors  leaning over incr pain   Multiple Pain Sites No                       [facility] Adult PT Treatment/Exercise - 06/29/15 1247    Neck Exercises: Seated   Neck Retraction 10 reps  5-10 sec   Neck Retraction Limitations with gentle cervical rotation RT and Lt x5   Lateral Flexion Right;Left;5 reps  with cervical retraction   Shoulder Exercises: Seated   Retraction 10 reps   Other Seated Exercises Arm raises with cervical  retraction lengthening x5 each arm.    Modalities   Modalities Moist Heat;Ultrasound   Moist Heat Therapy   Number Minutes Moist Heat 15 Minutes   Moist Heat Location Cervical   Ultrasound   Ultrasound Location neck   Ultrasound Parameters 100% 1MHz 1.5 Wcm2   Ultrasound Goals Pain   Manual Therapy   Manual therapy comments STW with black [place] blade and manually toi neck                  PT Short Term Goals - 06/29/15 1316    PT SHORT TERM GOAL #1   Title She will report 25% less pain during normal activity   Status Achieved   PT SHORT TERM GOAL #2   Title She will be independent with inital HEP   Status Achieved   PT SHORT TERM GOAL #3   Title She will improve active range to stop extending with rotation and sidebending of neck   Status Partially Met           PT Long Term Goals - 06/29/15 1316    PT LONG TERM GOAL #1   Title She will report independence with all HEP issued as of last visit   Status On-going   PT  LONG TERM GOAL #2   Title She will improve cervical rotation to 60 degrees or more to ease pain with turning neck   Baseline measyre next treatment   Status Unable to assess   PT LONG TERM GOAL #3   Title She will report pain more intermittnant   Status On-going   PT LONG TERM GOAL #4   Title She will report minimal (2-3/10 max) pain with home tasks including looking down for cleaning   Status Partially Met               Plan - 06/29/15 1314    Clinical Impression Statement She reported feeling better post treatment. Need to measure next visit.  Continue stab exercises and add to home as able. COntinue modalities/manual   PT Next Visit Plan MAunual treatment, modalities , stab exer MEASURE ROM   Consulted and Agree with Plan of Care Patient         Problem List Patient Active Problem List   Diagnosis Date Noted  . Chest pain 03/09/2015  . Shingles 03/09/2015  . Sick sinus syndrome 02/03/2015  . Shortness of breath 02/01/2014   . Fatigue 02/01/2014  . BENIGN PAROXYSMAL POSITIONAL VERTIGO 04/06/2010  . BRADYCARDIA 01/27/2009  . Diabetes mellitus 02/24/2008  . OBSTRUCTIVE SLEEP APNEA 02/24/2008  . 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 Essential hypertension 02/24/2008  . ALLERGIC RHINITIS 02/24/2008  . CAROTID BRUIT 02/24/2008    Darrel Hoover PT 06/29/2015, 1:18 PM  [facility] [address] [place], [place], [address] Phone: [phone]   Fax:  [phone]

## 2015-07-02 ENCOUNTER — Ambulatory Visit: Payer: Medicare HMO

## 2015-07-02 DIAGNOSIS — M62838 Other muscle spasm: Secondary | ICD-10-CM

## 2015-07-02 DIAGNOSIS — M436 Torticollis: Secondary | ICD-10-CM

## 2015-07-02 DIAGNOSIS — M542 Cervicalgia: Secondary | ICD-10-CM

## 2015-07-02 DIAGNOSIS — R293 Abnormal posture: Secondary | ICD-10-CM

## 2015-07-02 NOTE — Patient Instructions (Signed)
Issued from cabinet Level 1 cervical stabilization  exer 1-3 and added elbow flex/extension 10 reps 1-2x/day and offer she could do these sitting and supine

## 2015-07-02 NOTE — Therapy (Signed)
[place], [place], [address] Phone: [phone]   Fax:  [phone]  Physical Therapy Treatment  Patient Details  Name: [name] MRN: [id number] Date of Birth: 1930/05/08 Referring Provider:  Foye Spurling, MD  Encounter Date: 07/02/2015      PT End of Session - 07/02/15 1141    Visit Number 5   Number of Visits 12   Date for PT Re-Evaluation 07/16/15   PT Start Time 1100   PT Stop Time 1155   PT Time Calculation (min) 55 min   Activity Tolerance Patient tolerated treatment well   Behavior During Therapy [facility] for tasks assessed/performed      Past Medical History  Diagnosis Date  . 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 HYPERTENSION 02/24/2008  . CAROTID BRUIT 02/24/2008  . DIABETES MELLITUS, TYPE II 02/24/2008  . Benign paroxysmal positional vertigo 04/06/2010    Past Surgical History  Procedure Laterality Date  . Pacemaker insertion      There were no vitals filed for this visit.  Visit Diagnosis:  Stiffness of neck  Abnormal posture  Muscle spasms of neck  Pain in neck      Subjective Assessment - 07/02/15 1101    Subjective Better with turning head  but bending forward gives dizziness.    Currently in Pain? Yes   Pain Score 2    Pain Location Neck   Pain Orientation Left;Right;Posterior   Pain Descriptors / Indicators Aching   Pain Type Chronic pain   Pain Onset More than a month ago   Pain Frequency Constant            [facility] PT Assessment - 07/02/15 0001    AROM   Cervical - Right Rotation 65   Cervical - Left Rotation 56                     [facility] Adult PT Treatment/Exercise - 07/02/15 1136    Neck Exercises: Supine   Other Supine Exercise Supine level 2  stab initiated but RT shoulder pain stopped exercises and changed to stab exer L1   Moist Heat Therapy   Number Minutes Moist Heat 12 Minutes   Moist Heat Location Cervical   Ultrasound    Ultrasound Location neck   Ultrasound Parameters 100% .5Wcm2, 1MHZ  Used unit in [facility] room and had to lower intensity to .5 Wcm   Manual Therapy   Manual therapy comments STW with black [place] blade and manually toi neck                PT Education - 07/02/15 1141    Education Details cervical stab exercise   Person(s) Educated Patient   Methods Explanation;Demonstration;Tactile cues;Verbal cues;Handout   Comprehension Returned demonstration          PT Short Term Goals - 06/29/15 1316    PT SHORT TERM GOAL #1   Title She will report 25% less pain during normal activity   Status Achieved   PT SHORT TERM GOAL #2   Title She will be independent with inital HEP   Status Achieved   PT SHORT TERM GOAL #3   Title She will improve active range to stop extending with rotation and sidebending of neck   Status Partially Met           PT Long Term Goals - 06/29/15 1316    PT LONG TERM GOAL #1   Title  She will report independence with all HEP issued as of last visit   Status On-going   PT LONG TERM GOAL #2   Title She will improve cervical rotation to 60 degrees or more to ease pain with turning neck   Baseline measyre next treatment   Status Unable to assess   PT LONG TERM GOAL #3   Title She will report pain more intermittnant   Status On-going   PT LONG TERM GOAL #4   Title She will report minimal (2-3/10 max) pain with home tasks including looking down for cleaning   Status Partially Met               Plan - 07/02/15 1143    Clinical Impression Statement Pain improved per patient being able to turn head with minimal pain Ranges slightly increased with rotation. She needs to do stab exer with arms at side. Start with [place] at .5 Wcm2 then incre as able to comfortable level   PT Next Visit Plan Maunual treatment, modalities , stab exer    PT Home Exercise Plan Level 1 cervical stab   Consulted and Agree with Plan of Care Patient        Problem  List Patient Active Problem List   Diagnosis Date Noted  . Chest pain 03/09/2015  . Shingles 03/09/2015  . Sick sinus syndrome 02/03/2015  . Shortness of breath 02/01/2014  . Fatigue 02/01/2014  . BENIGN PAROXYSMAL POSITIONAL VERTIGO 04/06/2010  . BRADYCARDIA 01/27/2009  . Diabetes mellitus 02/24/2008  . OBSTRUCTIVE SLEEP APNEA 02/24/2008  . 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 Essential hypertension 02/24/2008  . ALLERGIC RHINITIS 02/24/2008  . CAROTID BRUIT 02/24/2008    Darrel Hoover PT 07/02/2015, 11:49 AM  [facility] [address] [place], [place], [address] Phone: [phone]   Fax:  [phone]

## 2015-07-06 ENCOUNTER — Ambulatory Visit: Payer: Medicare HMO

## 2015-07-06 DIAGNOSIS — R293 Abnormal posture: Secondary | ICD-10-CM

## 2015-07-06 DIAGNOSIS — M436 Torticollis: Secondary | ICD-10-CM

## 2015-07-06 DIAGNOSIS — M62838 Other muscle spasm: Secondary | ICD-10-CM

## 2015-07-06 DIAGNOSIS — M542 Cervicalgia: Secondary | ICD-10-CM

## 2015-07-06 NOTE — Therapy (Signed)
[place], [place], [address] Phone: [phone]   Fax:  [phone]  Physical Therapy Treatment  Patient Details  Name: [name] MRN: [id number] Date of Birth: [birth year] Referring Provider:  Foye Spurling, MD  Encounter Date: 07/06/2015      PT End of Session - 07/06/15 1144    Visit Number 6   Number of Visits 12   Date for PT Re-Evaluation 07/16/15   PT Start Time 1100   PT Stop Time 1155   PT Time Calculation (min) 55 min   Activity Tolerance Patient tolerated treatment well   Behavior During Therapy [facility] for tasks assessed/performed      Past Medical History  Diagnosis Date  . 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 HYPERTENSION 02/24/2008  . CAROTID BRUIT 02/24/2008  . DIABETES MELLITUS, TYPE II 02/24/2008  . Benign paroxysmal positional vertigo 04/06/2010    Past Surgical History  Procedure Laterality Date  . Pacemaker insertion      There were no vitals filed for this visit.  Visit Diagnosis:  Stiffness of neck  Muscle spasms of neck  Pain in neck  Abnormal posture      Subjective Assessment - 07/06/15 1103    Subjective eck has been pretty good. SOmewhat uncomfortable this AM so did exercise and took shower. and was better   Currently in Pain? Yes   Pain Score 2                          [facility] Adult PT Treatment/Exercise - 07/06/15 1109    Moist Heat Therapy   Number Minutes Moist Heat 12 Minutes   Moist Heat Location Cervical   Ultrasound   Ultrasound Location neck   Ultrasound Parameters 100% 1MHz 1 Wcm2   Ultrasound Goals Pain   Manual Therapy   Manual therapy comments STW with black [place] blade and manually to neck                PT Education - 07/06/15 1143    Education provided Yes   Education Details Used pictures to explain facet hypertrophy and how this may effect her pain   Person(s) Educated Patient   Methods Explanation    Comprehension Verbalized understanding     Discussed how postures can impact these changes in her neck     PT Short Term Goals - 06/29/15 1316    PT SHORT TERM GOAL #1   Title She will report 25% less pain during normal activity   Status Achieved   PT SHORT TERM GOAL #2   Title She will be independent with inital HEP   Status Achieved   PT SHORT TERM GOAL #3   Title She will improve active range to stop extending with rotation and sidebending of neck   Status Partially Met           PT Long Term Goals - 06/29/15 1316    PT LONG TERM GOAL #1   Title She will report independence with all HEP issued as of last visit   Status On-going   PT LONG TERM GOAL #2   Title She will improve cervical rotation to 60 degrees or more to ease pain with turning neck   Baseline measyre next treatment   Status Unable to assess   PT LONG TERM GOAL #3   Title She will report pain more intermittnant   Status On-going  PT LONG TERM GOAL #4   Title She will report minimal (2-3/10 max) pain with home tasks including looking down for cleaning   Status Partially Met               Plan - 07/06/15 1145    Clinical Impression Statement Pain improveing. Will measure next visit and add to HEP   PT Next Visit Plan MAual and modalities, review HEP and add as able. measure and goals   Consulted and Agree with Plan of Care Patient        Problem List Patient Active Problem List   Diagnosis Date Noted  . Chest pain 03/09/2015  . Shingles 03/09/2015  . Sick sinus syndrome 02/03/2015  . Shortness of breath 02/01/2014  . Fatigue 02/01/2014  . BENIGN PAROXYSMAL POSITIONAL VERTIGO 04/06/2010  . BRADYCARDIA 01/27/2009  . Diabetes mellitus 02/24/2008  . OBSTRUCTIVE SLEEP APNEA 02/24/2008  . 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 Essential hypertension 02/24/2008  . ALLERGIC RHINITIS 02/24/2008  . CAROTID BRUIT 02/24/2008    Darrel Hoover PT 07/06/2015, 11:53 AM  [facility] [address] [place], [place], [address] Phone: [phone]   Fax:  [phone]

## 2015-07-08 ENCOUNTER — Ambulatory Visit: Payer: Medicare HMO | Admitting: Physical Therapy

## 2015-07-08 DIAGNOSIS — M542 Cervicalgia: Secondary | ICD-10-CM

## 2015-07-08 DIAGNOSIS — R293 Abnormal posture: Secondary | ICD-10-CM

## 2015-07-08 DIAGNOSIS — M436 Torticollis: Secondary | ICD-10-CM | POA: Diagnosis not present

## 2015-07-08 NOTE — Therapy (Signed)
[place], [place], [address] Phone: [phone]   Fax:  [phone]  Physical Therapy Treatment  Patient Details  Name: [name] MRN: [id number] Date of Birth: 1930/07/30 Referring Provider:  Foye Spurling, MD  Encounter Date: 07/08/2015      PT End of Session - 07/08/15 1311    Visit Number 7   Number of Visits 12   Date for PT Re-Evaluation 07/16/15   PT Start Time 1105   PT Stop Time 1200   PT Time Calculation (min) 55 min   Activity Tolerance Patient tolerated treatment well;No increased pain   Behavior During Therapy [facility] for tasks assessed/performed      Past Medical History  Diagnosis Date  . 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 HYPERTENSION 02/24/2008  . CAROTID BRUIT 02/24/2008  . DIABETES MELLITUS, TYPE II 02/24/2008  . Benign paroxysmal positional vertigo 04/06/2010    Past Surgical History  Procedure Laterality Date  . Pacemaker insertion      There were no vitals filed for this visit.  Visit Diagnosis:  Stiffness of neck  Pain in neck  Abnormal posture      Subjective Assessment - 07/08/15 1101    Subjective Neck feels good, sore when she moves.  Got some vertigo leaning back to place eyedrops.  lasted 15 minutes.    More neck motion today .  feet going to sleep limitscooking ,  Neck does not hurt during around.   Currently in Pain? No/denies   Pain Score 2    Pain Location Neck   Pain Orientation Right;Left   Pain Descriptors / Indicators Discomfort   Pain Radiating Towards base of skull down spine  discomfort to upper thoracic   Pain Frequency Intermittent   Aggravating Factors  end range.   Pain Relieving Factors neutral neck                         [facility] Adult PT Treatment/Exercise - 07/08/15 1105    Neck Exercises: Seated   Neck Retraction 10 reps;10 secs   Cervical Rotation 10 reps   Cervical Rotation Limitations 60 degrees both,  cues needed to avoid extension RT   Lateral Flexion 10 reps  3 reps.   Lateral Flexion Limitations 56degrees both   Neck Exercises: Supine   Lateral Flexion --  5 second holds each   Other Supine Exercise Supine stabilization exercises  Chin tuck with toung,  with fist with arm turns with fist to hand Pesses 10 X reah 5  seconds,, Lt rib area cramp brief   Moist Heat Therapy   Number Minutes Moist Heat 15 Minutes   Moist Heat Location Cervical   Ultrasound   Ultrasound Location Neck   Ultrasound Parameters 100% 1.5 watts/cm2 , 5 minutes   Ultrasound Goals Pain   Neck Exercises: Stretches   Upper Trapezius Stretch 1 rep;10 seconds   Levator Stretch 5 reps;10 seconds                PT Education - 07/08/15 1311    Education provided Yes   Education Details how to turn neck, rotation without extension   Person(s) Educated Patient   Methods Explanation;Demonstration   Comprehension Verbalized understanding;Returned demonstration          PT Short Term Goals - 07/08/15 1313    PT SHORT TERM GOAL #1   Title She will report 25% less pain  during normal activity   Status Achieved   PT SHORT TERM GOAL #2   Title She will be independent with inital HEP   Status Achieved   PT SHORT TERM GOAL #3   Baseline Extension with Rotation to RT   Time 3   Period Weeks   Status Partially Met           PT Long Term Goals - 07/08/15 1314    PT LONG TERM GOAL #1   Title She will report independence with all HEP issued as of last visit   Status On-going   PT LONG TERM GOAL #2   Title She will improve cervical rotation to 60 degrees or more to ease pain with turning neck   Baseline 56 degrees both   Time 6   Period Weeks   Status On-going   PT LONG TERM GOAL #3   Title She will report pain more intermittnant   Baseline much more intermittant   Time 6   Period Weeks   Status Achieved   PT LONG TERM GOAL #4   Title She will report minimal (2-3/10 max) pain with home tasks  including looking down for cleaning   Baseline she does not clean looking down, dizzy.     Time 6   Period Weeks   Status Partially Met               Plan - 07/08/15 1312    Clinical Impression Statement LTG#2 met,  ROM improving   PT Next Visit Plan Continue ROM and stab 2 if able   Consulted and Agree with Plan of Care Patient        Problem List Patient Active Problem List   Diagnosis Date Noted  . Chest pain 03/09/2015  . Shingles 03/09/2015  . Sick sinus syndrome 02/03/2015  . Shortness of breath 02/01/2014  . Fatigue 02/01/2014  . BENIGN PAROXYSMAL POSITIONAL VERTIGO 04/06/2010  . BRADYCARDIA 01/27/2009  . Diabetes mellitus 02/24/2008  . OBSTRUCTIVE SLEEP APNEA 02/24/2008  . 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 Essential hypertension 02/24/2008  . ALLERGIC RHINITIS 02/24/2008  . CAROTID BRUIT 02/24/2008    Ezariah Nace 07/08/2015, 1:17 PM  [facility] [address] [place], [place], [address] Phone: [phone]   Fax:  [phone]     Melvenia Needles, PTA 07/08/2015 1:17 PM Phone: [phone] Fax: [phone]

## 2015-08-09 ENCOUNTER — Ambulatory Visit (INDEPENDENT_AMBULATORY_CARE_PROVIDER_SITE_OTHER): Payer: Medicare HMO | Admitting: *Deleted

## 2015-08-09 DIAGNOSIS — I495 Sick sinus syndrome: Secondary | ICD-10-CM

## 2015-08-10 NOTE — Progress Notes (Signed)
PPM REMOTE

## 2015-08-13 LAB — CUP PACEART REMOTE DEVICE CHECK
Battery Remaining Longevity: 13 mo
Battery Remaining Percentage: 12 %
Battery Voltage: 2.72 V
Brady Statistic AP VP Percent: 46 %
Brady Statistic AP VS Percent: 54 %
Brady Statistic AS VP Percent: 1 %
Brady Statistic AS VS Percent: 1 %
Brady Statistic RA Percent Paced: 99 %
Brady Statistic RV Percent Paced: 46 %
Date Time Interrogation Session: 20161024162533
Implantable Lead Implant Date: 20100422
Implantable Lead Implant Date: 20100422
Implantable Lead Location: 753859
Implantable Lead Location: 753860
Lead Channel Impedance Value: 360 Ohm
Lead Channel Impedance Value: 430 Ohm
Lead Channel Pacing Threshold Amplitude: 0.75 V
Lead Channel Pacing Threshold Amplitude: 1 V
Lead Channel Pacing Threshold Pulse Width: 0.4 ms
Lead Channel Pacing Threshold Pulse Width: 0.4 ms
Lead Channel Sensing Intrinsic Amplitude: 10.4 mV
Lead Channel Sensing Intrinsic Amplitude: 2.9 mV
Lead Channel Setting Pacing Amplitude: 2 V
Lead Channel Setting Pacing Amplitude: 2.5 V
Lead Channel Setting Pacing Pulse Width: 0.4 ms
Lead Channel Setting Sensing Sensitivity: 2 mV
Pulse Gen Model: 2210
Pulse Gen Serial Number: 2284731

## 2015-08-18 ENCOUNTER — Encounter: Payer: Self-pay | Admitting: Cardiology

## 2015-09-06 ENCOUNTER — Other Ambulatory Visit: Payer: Self-pay

## 2015-11-08 ENCOUNTER — Ambulatory Visit (INDEPENDENT_AMBULATORY_CARE_PROVIDER_SITE_OTHER): Payer: Medicare HMO | Admitting: *Deleted

## 2015-11-08 DIAGNOSIS — I495 Sick sinus syndrome: Secondary | ICD-10-CM | POA: Diagnosis not present

## 2015-11-09 NOTE — Progress Notes (Signed)
Remote pacemaker transmission.   

## 2015-11-11 LAB — CUP PACEART REMOTE DEVICE CHECK
Battery Remaining Longevity: 9 mo
Battery Remaining Percentage: 8 %
Battery Voltage: 2.69 V
Brady Statistic AP VP Percent: 49 %
Brady Statistic AP VS Percent: 50 %
Brady Statistic AS VP Percent: 1 %
Brady Statistic AS VS Percent: 1 %
Brady Statistic RA Percent Paced: 99 %
Brady Statistic RV Percent Paced: 49 %
Date Time Interrogation Session: 20170123084130
Implantable Lead Implant Date: 20100422
Implantable Lead Implant Date: 20100422
Implantable Lead Location: 753859
Implantable Lead Location: 753860
Lead Channel Impedance Value: 390 Ohm
Lead Channel Impedance Value: 530 Ohm
Lead Channel Sensing Intrinsic Amplitude: 12 mV
Lead Channel Sensing Intrinsic Amplitude: 5 mV
Lead Channel Setting Pacing Amplitude: 2 V
Lead Channel Setting Pacing Amplitude: 2.5 V
Lead Channel Setting Pacing Pulse Width: 0.4 ms
Lead Channel Setting Sensing Sensitivity: 2 mV
Pulse Gen Model: 2210
Pulse Gen Serial Number: 2284731

## 2015-11-17 ENCOUNTER — Encounter: Payer: Self-pay | Admitting: Cardiology

## 2015-11-23 DIAGNOSIS — H2513 Age-related nuclear cataract, bilateral: Secondary | ICD-10-CM | POA: Diagnosis not present

## 2015-11-23 DIAGNOSIS — H209 Unspecified iridocyclitis: Secondary | ICD-10-CM | POA: Diagnosis not present

## 2015-11-23 DIAGNOSIS — H4043X3 Glaucoma secondary to eye inflammation, bilateral, severe stage: Secondary | ICD-10-CM | POA: Diagnosis not present

## 2015-11-24 DIAGNOSIS — I10 Essential (primary) hypertension: Secondary | ICD-10-CM | POA: Diagnosis not present

## 2015-11-24 DIAGNOSIS — M150 Primary generalized (osteo)arthritis: Secondary | ICD-10-CM | POA: Diagnosis not present

## 2015-11-24 DIAGNOSIS — E119 Type 2 diabetes mellitus without complications: Secondary | ICD-10-CM | POA: Diagnosis not present

## 2015-11-24 DIAGNOSIS — E109 Type 1 diabetes mellitus without complications: Secondary | ICD-10-CM | POA: Diagnosis not present

## 2015-11-24 DIAGNOSIS — R0989 Other specified symptoms and signs involving the circulatory and respiratory systems: Secondary | ICD-10-CM | POA: Diagnosis not present

## 2015-11-26 ENCOUNTER — Other Ambulatory Visit: Payer: Self-pay | Admitting: Internal Medicine

## 2015-11-26 DIAGNOSIS — R0989 Other specified symptoms and signs involving the circulatory and respiratory systems: Secondary | ICD-10-CM

## 2015-11-26 DIAGNOSIS — H209 Unspecified iridocyclitis: Secondary | ICD-10-CM | POA: Diagnosis not present

## 2015-11-26 DIAGNOSIS — H2513 Age-related nuclear cataract, bilateral: Secondary | ICD-10-CM | POA: Diagnosis not present

## 2015-11-26 DIAGNOSIS — H4043X3 Glaucoma secondary to eye inflammation, bilateral, severe stage: Secondary | ICD-10-CM | POA: Diagnosis not present

## 2015-11-29 ENCOUNTER — Inpatient Hospital Stay (HOSPITAL_COMMUNITY): Admission: RE | Admit: 2015-11-29 | Payer: Medicare HMO | Source: Ambulatory Visit

## 2015-11-30 ENCOUNTER — Ambulatory Visit (HOSPITAL_COMMUNITY)
Admission: RE | Admit: 2015-11-30 | Discharge: 2015-11-30 | Disposition: A | Discharge status: Home / Self Care | Payer: Medicare HMO | Source: Ambulatory Visit | Attending: Internal Medicine | Admitting: Internal Medicine

## 2015-11-30 DIAGNOSIS — R0989 Other specified symptoms and signs involving the circulatory and respiratory systems: Secondary | ICD-10-CM | POA: Diagnosis not present

## 2015-11-30 NOTE — Progress Notes (Signed)
*  PRELIMINARY RESULTS* Vascular Ultrasound Carotid Duplex has been completed.  Preliminary findings: Bilateral: No significant (1-39%) ICA stenosis. Antegrade vertebral flow.    Landry Mellow, RDMS, RVT  11/30/2015, 9:34 AM

## 2015-12-02 ENCOUNTER — Other Ambulatory Visit: Payer: Self-pay

## 2015-12-02 DIAGNOSIS — Z1231 Encounter for screening mammogram for malignant neoplasm of breast: Secondary | ICD-10-CM

## 2015-12-03 ENCOUNTER — Telehealth: Payer: Self-pay | Admitting: Internal Medicine

## 2015-12-03 NOTE — Telephone Encounter (Signed)
Patient c/o Palpitations:  High priority if patient c/o lightheadedness and shortness of breath.  Pt c/o feeling her heart beat in her left ear- at night- waking her up. Sched pt w/ [facility] on 2/22  1. How long have you been having palpitations? 4 weeks  2. Are you currently experiencing lightheadedness and shortness of breath? Some SOB with palp last 4 weeks  3. Have you checked your BP and heart rate? (document readings) n/a  4. Are you experiencing any other symptoms? no

## 2015-12-03 NOTE — Telephone Encounter (Signed)
Spoke with patient and this has been going on for a month.  She is scheduled for an appointment on Wed and feels she is okay to wait until then.  She is not in any distress at present.  Appreciated my call

## 2015-12-08 ENCOUNTER — Ambulatory Visit (INDEPENDENT_AMBULATORY_CARE_PROVIDER_SITE_OTHER): Payer: Medicare HMO | Admitting: Internal Medicine

## 2015-12-08 ENCOUNTER — Encounter: Payer: Self-pay | Admitting: Internal Medicine

## 2015-12-08 VITALS — BP 140/62 | HR 72 | Ht 65.0 in | Wt 191.4 lb

## 2015-12-08 DIAGNOSIS — Z950 Presence of cardiac pacemaker: Secondary | ICD-10-CM

## 2015-12-08 DIAGNOSIS — G4733 Obstructive sleep apnea (adult) (pediatric): Secondary | ICD-10-CM | POA: Diagnosis not present

## 2015-12-08 DIAGNOSIS — I10 Essential (primary) hypertension: Secondary | ICD-10-CM | POA: Diagnosis not present

## 2015-12-08 DIAGNOSIS — R0602 Shortness of breath: Secondary | ICD-10-CM

## 2015-12-08 DIAGNOSIS — I495 Sick sinus syndrome: Secondary | ICD-10-CM | POA: Diagnosis not present

## 2015-12-08 LAB — CUP PACEART INCLINIC DEVICE CHECK
Brady Statistic RA Percent Paced: 99 %
Brady Statistic RV Percent Paced: 51 %
Date Time Interrogation Session: 20170222102845
Implantable Lead Implant Date: 20100422
Implantable Lead Implant Date: 20100422
Implantable Lead Location: 753859
Implantable Lead Location: 753860
Lead Channel Impedance Value: 390 Ohm
Lead Channel Impedance Value: 490 Ohm
Lead Channel Pacing Threshold Amplitude: 0.75 V
Lead Channel Pacing Threshold Amplitude: 1 V
Lead Channel Pacing Threshold Pulse Width: 0.4 ms
Lead Channel Pacing Threshold Pulse Width: 0.4 ms
Lead Channel Sensing Intrinsic Amplitude: 10.9 mV
Lead Channel Sensing Intrinsic Amplitude: 4.1 mV
Lead Channel Setting Pacing Amplitude: 2 V
Lead Channel Setting Pacing Amplitude: 2.5 V
Lead Channel Setting Pacing Pulse Width: 0.4 ms
Lead Channel Setting Sensing Sensitivity: 2 mV
Pulse Gen Model: 2210
Pulse Gen Serial Number: 2284731

## 2015-12-08 NOTE — Progress Notes (Signed)
Electrophysiology Office Note   Date:  12/08/2015   ID:  [name], DOB 1930-06-12, MRN [id number]  PCP:  Foye Spurling, MD   Primary Electrophysiologist: Thompson Grayer, MD    Chief Complaint  Patient presents with  . Bradycardia     History of Present Illness: [name] is a 82 y.o. female who presents today for electrophysiology evaluation.   She has stable SOB.  She has chronic edema.  She has atypical chest pain (under her left chest) which is not exertional.  She has noticed occasional palpitations during the night, over the past few weeks.  She has sleep apnea but does not use CPAP.  Today, she denies symptoms of claudication, dizziness, presyncope, syncope, bleeding, or neurologic sequela. The patient is tolerating medications without difficulties and is otherwise without complaint today.    Past Medical History  Diagnosis Date  . 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 HYPERTENSION 02/24/2008  . CAROTID BRUIT 02/24/2008  . DIABETES MELLITUS, TYPE II 02/24/2008  . Benign paroxysmal positional vertigo 04/06/2010   Past Surgical History  Procedure Laterality Date  . Pacemaker insertion       Current Outpatient Prescriptions  Medication Sig Dispense Refill  . allopurinol (ZYLOPRIM) 100 MG tablet Take 100 mg by mouth daily.      Marland Kitchen amLODipine (NORVASC) 10 MG tablet Take 10 mg by mouth daily.    . AZOPT 1 % ophthalmic suspension Place 1 drop into the right eye 2 (two) times daily.  6  . cetirizine (ZYRTEC) 10 MG tablet Take 10 mg by mouth daily. For allergies    . glimepiride (AMARYL) 1 MG tablet Take 0.5 mg by mouth 2 (two) times daily.     . hydrochlorothiazide (MICROZIDE) 12.5 MG capsule Take 1 capsule (12.5 mg total) by mouth daily. 90 capsule 3  . latanoprost (XALATAN) 0.005 % ophthalmic solution Place 1 drop into both eyes at bedtime.    Marland Kitchen LYRICA 50 MG capsule Take 1 capsule by mouth 2 (two) times daily.    . meclizine (ANTIVERT) 25 MG tablet Take 25 mg  by mouth daily as needed for dizziness.    . methazolamide (NEPTAZANE) 25 MG tablet Take 25 mg by mouth daily.  3  . Multiple Vitamin (MULTIVITAMIN WITH MINERALS) TABS tablet Take 1 tablet by mouth daily.    . naproxen (NAPROSYN) 375 MG tablet Take 1 tablet (375 mg total) by mouth 2 (two) times daily. (Patient taking differently: Take 375 mg by mouth 2 (two) times daily as needed for moderate pain. ) 20 tablet 0  . prednisoLONE acetate (PRED FORTE) 1 % ophthalmic suspension Place 1 drop into the right eye 4 (four) times daily. (Patient taking differently: Place 1 drop into the right eye 2 (two) times daily. ) 5 mL 0  . Pyridoxine HCl (VITAMIN B-6 PO) Take 1 tablet by mouth daily.    Marland Kitchen spironolactone (ALDACTONE) 25 MG tablet Take 25 mg by mouth every other day.     . valsartan (DIOVAN) 160 MG tablet Take 160 mg by mouth daily.  2  . vitamin C (ASCORBIC ACID) 250 MG tablet Take 500 mg by mouth daily.     No current facility-administered medications for this visit.    Allergies:   Aspirin; Diltiazem hcl; Hydrocodone-acetaminophen; Morphine; Penicillins; and Neurontin   Social History:  The patient  reports that she has never smoked. She does not have any smokeless tobacco history on file. She reports that she  does not drink alcohol or use illicit drugs.   Family History:  The patient's family history includes Arthritis in her father; Bone cancer in her father; Breast cancer in her sister; Colon cancer in her sister; Congestive Heart Failure in her mother; Dementia in her sister; Hypertension in her sister; Lymphoma in her mother; Tuberculosis in her mother.    ROS:  Please see the history of present illness.   All other systems are reviewed and negative.    PHYSICAL EXAM: VS:  BP 140/62 mmHg  Pulse 72  Ht 5\' 5"  (1.651 m)  Wt 191 lb 6.4 oz (86.818 kg)  BMI 31.85 kg/m2 , BMI Body mass index is 31.85 kg/(m^2). GEN: Well nourished, well developed, in no acute distress HEENT: normal Neck: no  JVD, carotid bruits, or masses Cardiac: RRR; no murmurs, rubs, or gallops,trace edema  Respiratory:  clear to auscultation bilaterally, normal work of breathing GI: soft, nontender, nondistended, + BS MS: no deformity or atrophy Skin: warm and dry, device pocket is well healed Neuro:  Strength and sensation are intact Psych: euthymic mood, full affect  Device interrogation is reviewed today in detail.  See PaceArt for details.   Recent Labs: 03/09/2015: ALT 15 06/18/2015: BUN 24*; Creatinine, Ser 0.82; Hemoglobin 11.7*; Platelets 221; Potassium 4.1; Sodium 137    Lipid Panel     Component Value Date/Time   CHOL 162 03/09/2015 2143   TRIG 108 03/09/2015 2143   HDL 43 03/09/2015 2143   CHOLHDL 3.8 03/09/2015 2143   VLDL 22 03/09/2015 2143   LDLCALC 97 03/09/2015 2143   ekg today reveals atrial pacing, PR 248 msec  Wt Readings from Last 3 Encounters:  12/08/15 191 lb 6.4 oz (86.818 kg)  06/18/15 185 lb (83.915 kg)  03/10/15 184 lb 4.9 oz (83.6 kg)    ASSESSMENT AND PLAN:  1.  Sick sinus Normal pacemaker function See Claudia Desanctis Art report Due to occasional palpitations with exertion, will have made sensory response less aggressive Palpitations at night are possibly due to V pacing (>50%) which may be a result of DDIR pacing mode.  This was done previously due to atrial tachycardia.  Could consider reprogramming DDDR with MVP to minimize V pacing in the future if symptoms do not improve.  2. SOB Chronic Regular exercise is encouraged Weight loss is encouraged She likely has a component of diastolic dysfunction 2 gram sodium restriction advised  3. HTN Stable No change required today  4. Atrial tachycardia Possibly the cause for her palpitations Reassured patient today Pacemaker is programmed DDIR  5. Obesity Body mass index is 31.85 kg/(m^2). Weight loss advised  6. OSA Compliance with CPAP encouarge  Current medicines are reviewed at length with the patient today.    The patient does not have concerns regarding her medicines.  The following changes were made today:  none   Follow-up: merlin, return to see EP NP in 6 months  Signed, Thompson Grayer, MD  12/08/2015 9:49 PM     [place] [address] [phone] (office) [phone] (fax)

## 2015-12-08 NOTE — Patient Instructions (Signed)
Medication Instructions:  Your physician recommends that you continue on your current medications as directed. Please refer to the Current Medication list given to you today.   Labwork: None ordered   Testing/Procedures: None ordered   Follow-Up: Your physician wants you to follow-up in: 6 months with Chanetta Marshall, NP You will receive a reminder letter in the mail two months in advance. If you don't receive a letter, please call our office to schedule the follow-up appointment.  Remote monitoring is used to monitor your Pacemaker  from home. This monitoring reduces the number of office visits required to check your device to one time per year. It allows [place] to keep an eye on the functioning of your device to ensure it is working properly. You are scheduled for a device check from home on 03/08/16. You may send your transmission at any time that day. If you have a wireless device, the transmission will be sent automatically. After your physician reviews your transmission, you will receive a postcard with your next transmission date.     Any Other Special Instructions Will Be Listed Below (If Applicable).     If you need a refill on your cardiac medications before your next appointment, please call your pharmacy.

## 2015-12-10 DIAGNOSIS — H2513 Age-related nuclear cataract, bilateral: Secondary | ICD-10-CM | POA: Diagnosis not present

## 2015-12-10 DIAGNOSIS — B029 Zoster without complications: Secondary | ICD-10-CM | POA: Diagnosis not present

## 2015-12-10 DIAGNOSIS — H4043X3 Glaucoma secondary to eye inflammation, bilateral, severe stage: Secondary | ICD-10-CM | POA: Diagnosis not present

## 2015-12-10 DIAGNOSIS — H209 Unspecified iridocyclitis: Secondary | ICD-10-CM | POA: Diagnosis not present

## 2015-12-14 ENCOUNTER — Encounter: Payer: Self-pay | Admitting: Internal Medicine

## 2015-12-21 ENCOUNTER — Ambulatory Visit
Admission: RE | Admit: 2015-12-21 | Discharge: 2015-12-21 | Disposition: A | Discharge status: Home / Self Care | Payer: Medicare HMO | Source: Ambulatory Visit

## 2015-12-21 DIAGNOSIS — Z1231 Encounter for screening mammogram for malignant neoplasm of breast: Secondary | ICD-10-CM

## 2015-12-30 DIAGNOSIS — R69 Illness, unspecified: Secondary | ICD-10-CM | POA: Diagnosis not present

## 2015-12-31 DIAGNOSIS — H4043X3 Glaucoma secondary to eye inflammation, bilateral, severe stage: Secondary | ICD-10-CM | POA: Diagnosis not present

## 2015-12-31 DIAGNOSIS — H25813 Combined forms of age-related cataract, bilateral: Secondary | ICD-10-CM | POA: Diagnosis not present

## 2016-01-07 DIAGNOSIS — H401133 Primary open-angle glaucoma, bilateral, severe stage: Secondary | ICD-10-CM | POA: Diagnosis not present

## 2016-01-07 DIAGNOSIS — H25813 Combined forms of age-related cataract, bilateral: Secondary | ICD-10-CM | POA: Diagnosis not present

## 2016-01-10 DIAGNOSIS — G5602 Carpal tunnel syndrome, left upper limb: Secondary | ICD-10-CM | POA: Diagnosis not present

## 2016-01-10 DIAGNOSIS — I10 Essential (primary) hypertension: Secondary | ICD-10-CM | POA: Diagnosis not present

## 2016-01-10 DIAGNOSIS — M150 Primary generalized (osteo)arthritis: Secondary | ICD-10-CM | POA: Diagnosis not present

## 2016-01-10 DIAGNOSIS — E119 Type 2 diabetes mellitus without complications: Secondary | ICD-10-CM | POA: Diagnosis not present

## 2016-01-17 DIAGNOSIS — H4041X3 Glaucoma secondary to eye inflammation, right eye, severe stage: Secondary | ICD-10-CM | POA: Diagnosis not present

## 2016-01-17 DIAGNOSIS — H409 Unspecified glaucoma: Secondary | ICD-10-CM | POA: Diagnosis not present

## 2016-02-17 DIAGNOSIS — E119 Type 2 diabetes mellitus without complications: Secondary | ICD-10-CM | POA: Diagnosis not present

## 2016-02-17 DIAGNOSIS — I10 Essential (primary) hypertension: Secondary | ICD-10-CM | POA: Diagnosis not present

## 2016-03-01 DIAGNOSIS — M47817 Spondylosis without myelopathy or radiculopathy, lumbosacral region: Secondary | ICD-10-CM | POA: Diagnosis not present

## 2016-03-01 DIAGNOSIS — E119 Type 2 diabetes mellitus without complications: Secondary | ICD-10-CM | POA: Diagnosis not present

## 2016-03-01 DIAGNOSIS — L309 Dermatitis, unspecified: Secondary | ICD-10-CM | POA: Diagnosis not present

## 2016-03-08 ENCOUNTER — Ambulatory Visit (INDEPENDENT_AMBULATORY_CARE_PROVIDER_SITE_OTHER): Payer: Medicare HMO | Admitting: *Deleted

## 2016-03-08 DIAGNOSIS — Z950 Presence of cardiac pacemaker: Secondary | ICD-10-CM

## 2016-03-08 DIAGNOSIS — I495 Sick sinus syndrome: Secondary | ICD-10-CM | POA: Diagnosis not present

## 2016-03-08 NOTE — Progress Notes (Signed)
Remote pacemaker transmission.   

## 2016-03-27 LAB — CUP PACEART REMOTE DEVICE CHECK
Battery Remaining Longevity: 1 mo
Battery Remaining Percentage: 0.5 %
Battery Voltage: 2.62 V
Brady Statistic AP VP Percent: 53 %
Brady Statistic AP VS Percent: 46 %
Brady Statistic AS VP Percent: 1 %
Brady Statistic AS VS Percent: 1 %
Brady Statistic RA Percent Paced: 99 %
Brady Statistic RV Percent Paced: 53 %
Date Time Interrogation Session: 20170524060011
Implantable Lead Implant Date: 20100422
Implantable Lead Implant Date: 20100422
Implantable Lead Location: 753859
Implantable Lead Location: 753860
Lead Channel Impedance Value: 380 Ohm
Lead Channel Impedance Value: 460 Ohm
Lead Channel Pacing Threshold Amplitude: 0.75 V
Lead Channel Pacing Threshold Amplitude: 1 V
Lead Channel Pacing Threshold Pulse Width: 0.4 ms
Lead Channel Pacing Threshold Pulse Width: 0.4 ms
Lead Channel Sensing Intrinsic Amplitude: 10.8 mV
Lead Channel Sensing Intrinsic Amplitude: 3.4 mV
Lead Channel Setting Pacing Amplitude: 2 V
Lead Channel Setting Pacing Amplitude: 2.5 V
Lead Channel Setting Pacing Pulse Width: 0.4 ms
Lead Channel Setting Sensing Sensitivity: 2 mV
Pulse Gen Model: 2210
Pulse Gen Serial Number: 2284731

## 2016-03-30 DIAGNOSIS — R69 Illness, unspecified: Secondary | ICD-10-CM | POA: Diagnosis not present

## 2016-04-04 ENCOUNTER — Encounter: Payer: Self-pay | Admitting: Cardiology

## 2016-04-10 ENCOUNTER — Ambulatory Visit (INDEPENDENT_AMBULATORY_CARE_PROVIDER_SITE_OTHER): Payer: Medicare HMO | Admitting: *Deleted

## 2016-04-10 DIAGNOSIS — Z950 Presence of cardiac pacemaker: Secondary | ICD-10-CM

## 2016-04-10 NOTE — Progress Notes (Signed)
Remote pacemaker transmission.   

## 2016-04-11 LAB — CUP PACEART REMOTE DEVICE CHECK
Battery Remaining Longevity: 1 mo
Battery Remaining Percentage: 0.5 %
Battery Voltage: 2.59 V
Brady Statistic AP VP Percent: 53 %
Brady Statistic AP VS Percent: 46 %
Brady Statistic AS VP Percent: 1 %
Brady Statistic AS VS Percent: 1 %
Brady Statistic RA Percent Paced: 99 %
Brady Statistic RV Percent Paced: 53 %
Date Time Interrogation Session: 20170626060008
Implantable Lead Implant Date: 20100422
Implantable Lead Implant Date: 20100422
Implantable Lead Location: 753859
Implantable Lead Location: 753860
Lead Channel Impedance Value: 410 Ohm
Lead Channel Impedance Value: 530 Ohm
Lead Channel Pacing Threshold Amplitude: 0.75 V
Lead Channel Pacing Threshold Amplitude: 1 V
Lead Channel Pacing Threshold Pulse Width: 0.4 ms
Lead Channel Pacing Threshold Pulse Width: 0.4 ms
Lead Channel Sensing Intrinsic Amplitude: 12 mV
Lead Channel Sensing Intrinsic Amplitude: 3.8 mV
Lead Channel Setting Pacing Amplitude: 2 V
Lead Channel Setting Pacing Amplitude: 2.5 V
Lead Channel Setting Pacing Pulse Width: 0.4 ms
Lead Channel Setting Sensing Sensitivity: 2 mV
Pulse Gen Model: 2210
Pulse Gen Serial Number: 2284731

## 2016-04-12 ENCOUNTER — Encounter: Payer: Self-pay | Admitting: Cardiology

## 2016-04-12 ENCOUNTER — Telehealth: Payer: Self-pay | Admitting: Internal Medicine

## 2016-04-12 NOTE — Telephone Encounter (Addendum)
Spoke wit patient, these symptoms are the same that she reported to Dr Rayann Heman at last OV per his note may be able to adjust PPM to help. Will see Renee 7/2 at 8:30 Patient aware and appreciative of my call

## 2016-04-12 NOTE — Telephone Encounter (Signed)
Patient c/o Palpitations:  High priority if patient c/o lightheadedness and shortness of breath.  1. How long have you been having palpitations? For a while  2. Are you currently experiencing lightheadedness and shortness of breath? SOB  3. Have you checked your BP and heart rate? (document readings)- stated that BP and HR are normal  4. Are you experiencing any other symptoms? Keeping her awake- can feel her HR, some CP (over several weeks) and pain in left arm- did not know if it was arthritis or indigestion.

## 2016-04-15 NOTE — Progress Notes (Signed)
Cardiology Office Note Date:  04/17/2016  Patient ID:  [name], DOB 1929/12/07, MRN [id number] PCP:  Foye Spurling, MD  Electrophysiologist:  Dr. Rayann Heman   Chief Complaint: night time palpitations.  History of Present Illness: [name] is a 82 y.o. female with history of strial tachycardia/bradycardia s/p PPM, HTN, DM, and vertigo, comes today to be seen for Dr. Rayann Heman.  She was last seen by him in Feb 2017, at that time with some exertional palpitations felt possibly secondary to sensory response on her pacer and was programmed to be less aggressive at that visit, there was mention of nighttime palpitations thought possibly due to V pacing and if persistent discussed reprogramming to DDDR with MVP on to decrease V pacing.   The patient is feeing pretty well.  States she can still feel her heart pounding at night, though when she checks her pulse is only in the 70's, but feels like it is beating stronger or harder.  No CP or SOB, no dizziness, near syncope or syncope.  She had a shingles infection about 1 1/2years ago, continues to battle with the lingering though less pain/issues of the right eye because of it.  She is feeling pretty well otherwise.  The patient has a known diagnosis of sleep apnea and was intolerant of the mask, not using her CPAP.  Past Medical History  Diagnosis Date  . 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 HYPERTENSION 02/24/2008  . CAROTID BRUIT 02/24/2008  . DIABETES MELLITUS, TYPE II 02/24/2008  . Benign paroxysmal positional vertigo 04/06/2010    Past Surgical History  Procedure Laterality Date  . Pacemaker insertion      Current Outpatient Prescriptions  Medication Sig Dispense Refill  . allopurinol (ZYLOPRIM) 100 MG tablet Take 100 mg by mouth daily.      Marland Kitchen amLODipine (NORVASC) 10 MG tablet Take 10 mg by mouth daily.    . cetirizine (ZYRTEC) 10 MG tablet Take 10 mg by mouth daily. For allergies    . glimepiride (AMARYL) 1 MG tablet Take 0.5  mg by mouth 2 (two) times daily.     . hydrochlorothiazide (MICROZIDE) 12.5 MG capsule Take 1 capsule (12.5 mg total) by mouth daily. 90 capsule 3  . latanoprost (XALATAN) 0.005 % ophthalmic solution Place 1 drop into both eyes at bedtime.    Marland Kitchen LYRICA 50 MG capsule Take 1 capsule by mouth 2 (two) times daily.    . meclizine (ANTIVERT) 25 MG tablet Take 25 mg by mouth daily as needed for dizziness.    . Multiple Vitamin (MULTIVITAMIN WITH MINERALS) TABS tablet Take 1 tablet by mouth daily.    . prednisoLONE acetate (PRED FORTE) 1 % ophthalmic suspension Place 2 drops into the right eye 2 (two) times daily.    . Pyridoxine HCl (VITAMIN B-6 PO) Take 1 tablet by mouth daily.    Marland Kitchen spironolactone (ALDACTONE) 25 MG tablet Take 25 mg by mouth every other day.     . valsartan (DIOVAN) 160 MG tablet Take 160 mg by mouth daily.  2  . vitamin C (ASCORBIC ACID) 250 MG tablet Take 500 mg by mouth daily.     No current facility-administered medications for this visit.    Allergies:   Aspirin; Diltiazem hcl; Hydrocodone-acetaminophen; Morphine; Penicillins; and Neurontin   Social History:  The patient  reports that she has never smoked. She does not have any smokeless tobacco history on file. She reports that she does not drink alcohol  or use illicit drugs.   Family History:  The patient's family history includes Arthritis in her father; Bone cancer in her father; Breast cancer in her sister; Colon cancer in her sister; Congestive Heart Failure in her mother; Dementia in her sister; Hypertension in her sister; Lymphoma in her mother; Tuberculosis in her mother.  ROS:  Please see the history of present illness.  All other systems are reviewed and otherwise negative.   PHYSICAL EXAM:  VS:  BP 154/72 mmHg  Pulse 70  Ht 5\' 5"  (1.651 m)  Wt 191 lb 12.8 oz (87 kg)  BMI 31.92 kg/m2 BMI: Body mass index is 31.92 kg/(m^2). Well nourished, well developed, in no acute distress HEENT: normocephalic,  atraumatic Neck: no JVD, carotid bruits or masses Cardiac:  normal S1, S2; RRR; no significant murmurs, no rubs, or gallops Lungs:  clear to auscultation bilaterally, no wheezing, rhonchi or rales Abd: soft, nontender MS: no deformity or atrophy Ext: no edema Skin: warm and dry, no rash Neuro:  No gross deficits appreciated Psych: euthymic mood, full affect  PPM site is stable, no tethering or discomfort   EKG:  Done today and reviewed by myself shows A paced, V sensed PPM interrogation today: Pacer appears to have just tripped ERI, battery voltage at 2.59 today, fucntioning normally, AT/AF episodes, appear Atach longest 14 seconds, AV delay extended to 349ms, noting 53% V pacing.  03/10/15: Echocardiogram Study Conclusions - Left ventricle: The cavity size was normal. Wall thickness was  increased in a pattern of mild LVH. The estimated ejection  fraction was 60%. Wall motion was normal; there were no regional  wall motion abnormalities. - Right ventricle: The cavity size was normal. Pacer wire or  catheter noted in right ventricle. Systolic function was normal.  03/01/14: Lexiscan stress test Impression Exercise Capacity: Lexiscan with no exercise. BP Response: Normal blood pressure response. Clinical Symptoms: chest pressure and throat tightness ECG Impression: No significant ST segment change suggestive of ischemia. Comparison with Prior Nuclear Study: No images to compare Overall Impression: Low risk stress nuclear study Fixed defec in the mid and basal inferior and inferolateral walls and apical lateral wall consistent with diaphragmatic attenuation. No evidence of ischemia.+.  Recent Labs: 06/18/2015: BUN 24*; Creatinine, Ser 0.82; Hemoglobin 11.7*; Platelets 221; Potassium 4.1; Sodium 137 04/17/2016: TSH 3.00  No results found for requested labs within last 365 days.   CrCl cannot be calculated (Patient has no serum creatinine result on file.).   Wt Readings  from Last 3 Encounters:  04/17/16 191 lb 12.8 oz (87 kg)  12/08/15 191 lb 6.4 oz (86.818 kg)  06/18/15 185 lb (83.915 kg)     Other studies reviewed: Additional studies/records reviewed today include: summarized above  DEVICE information:  STJ dual chamber PPM, implanted 02/04/09, Dr. Rayann Heman, [facility]  ASSESSMENT AND PLAN:  1. Sick sinus Normal pacemaker function, device has reached ERI See Pace Art report Reprogrammed DDIR >> DDI with AV delay to 380ms to try and minimize V pacing   2. SOB Chronic by her notes, she denies this c/o to me today Regular exercise is encouraged Weight loss is redicsussed She likely has a component of diastolic dysfunction 2 gram sodium restriction re-sdiscussed as well  3. HTN Stable No change required today  4. Atrial tachycardia Possibly the cause for her palpitations Reassured patient today   5. OSA untreated Lengthy discussion regarding her night time palpitations, though could be V pacing, certainly could be her sleep apnea.  Discussed importance  of trying to find a mask that is comfortable and tolerable to her   Disposition: F/u with Dr. Annamaria Boots to explore different mask options for her sleep apnea, she will be scheduled for generator change with Dr. Rayann Heman, TSH drawn as well as pre-op labs, wound check and post gen change appt to be scheduled.  Current medicines are reviewed at length with the patient today.  The patient did not have any concerns regarding medicines.  Haywood Lasso, PA-C 04/17/2016 5:37 PM     [address] [place] [address] [phone] (office)  [phone] (fax)

## 2016-04-17 ENCOUNTER — Ambulatory Visit (INDEPENDENT_AMBULATORY_CARE_PROVIDER_SITE_OTHER): Payer: Medicare HMO | Admitting: Physician Assistant

## 2016-04-17 ENCOUNTER — Encounter: Payer: Self-pay | Admitting: Physician Assistant

## 2016-04-17 ENCOUNTER — Other Ambulatory Visit: Payer: Self-pay | Admitting: Physician Assistant

## 2016-04-17 ENCOUNTER — Encounter: Payer: Self-pay | Admitting: *Deleted

## 2016-04-17 VITALS — BP 154/72 | HR 70 | Ht 65.0 in | Wt 191.8 lb

## 2016-04-17 DIAGNOSIS — I10 Essential (primary) hypertension: Secondary | ICD-10-CM

## 2016-04-17 DIAGNOSIS — I471 Supraventricular tachycardia: Secondary | ICD-10-CM

## 2016-04-17 DIAGNOSIS — Z950 Presence of cardiac pacemaker: Secondary | ICD-10-CM

## 2016-04-17 DIAGNOSIS — I495 Sick sinus syndrome: Secondary | ICD-10-CM | POA: Diagnosis not present

## 2016-04-17 DIAGNOSIS — R002 Palpitations: Secondary | ICD-10-CM | POA: Diagnosis not present

## 2016-04-17 LAB — TSH: TSH: 3 m[IU]/L

## 2016-04-17 NOTE — Patient Instructions (Addendum)
Medication Instructions:   Your physician recommends that you continue on your current medications as directed. Please refer to the Current Medication list given to you today.  If you need a refill on your cardiac medications before your next appointment, please call your pharmacy.  Labwork:  TSH TODAY   RETURN FOR CBC AND BMET  05/11/2016.Marland Kitchen   Testing/Procedures: SEE LETTER FOR GEN CHANGE 05/16/2016..   Follow-Up: AFTER 05/16/2016.. Coffee                AFTER 05/16/2016..90 DAYS WITH DR Rayann Heman PHY PAC[facility]  Any Other Special Instructions Will Be Listed Below (If Applicable).

## 2016-05-02 DIAGNOSIS — H2513 Age-related nuclear cataract, bilateral: Secondary | ICD-10-CM | POA: Diagnosis not present

## 2016-05-02 DIAGNOSIS — H401133 Primary open-angle glaucoma, bilateral, severe stage: Secondary | ICD-10-CM | POA: Diagnosis not present

## 2016-05-11 ENCOUNTER — Other Ambulatory Visit: Payer: Medicare HMO | Admitting: *Deleted

## 2016-05-11 DIAGNOSIS — I495 Sick sinus syndrome: Secondary | ICD-10-CM | POA: Diagnosis not present

## 2016-05-11 LAB — BASIC METABOLIC PANEL
BUN: 21 mg/dL (ref 7–25)
CO2: 26 mmol/L (ref 20–31)
Calcium: 9 mg/dL (ref 8.6–10.4)
Chloride: 106 mmol/L (ref 98–110)
Creat: 1.03 mg/dL — ABNORMAL HIGH (ref 0.60–0.88)
Glucose, Bld: 89 mg/dL (ref 65–99)
Potassium: 4 mmol/L (ref 3.5–5.3)
Sodium: 141 mmol/L (ref 135–146)

## 2016-05-11 LAB — CBC
HCT: 36.6 % (ref 35.0–45.0)
Hemoglobin: 11.4 g/dL — ABNORMAL LOW (ref 11.7–15.5)
MCH: 26.7 pg — ABNORMAL LOW (ref 27.0–33.0)
MCHC: 31.1 g/dL — ABNORMAL LOW (ref 32.0–36.0)
MCV: 85.7 fL (ref 80.0–100.0)
MPV: 10.8 fL (ref 7.5–12.5)
Platelets: 212 10*3/uL (ref 140–400)
RBC: 4.27 MIL/uL (ref 3.80–5.10)
RDW: 14.6 % (ref 11.0–15.0)
WBC: 6.2 10*3/uL (ref 3.8–10.8)

## 2016-05-11 NOTE — Addendum Note (Signed)
Addended by: Eulis Foster on: 05/11/2016 09:31 AM   Modules accepted: Orders

## 2016-05-15 ENCOUNTER — Telehealth: Payer: Self-pay | Admitting: *Deleted

## 2016-05-15 NOTE — Telephone Encounter (Signed)
SPOKE TO PT ABOUT LABS RESULTS

## 2016-05-15 NOTE — Telephone Encounter (Signed)
-----   Message from [facility], [place] sent at 05/12/2016  9:15 AM EDT ----- Please let the patient know her labs look good for her upcoming procedure.  Thanks [employer]

## 2016-05-16 ENCOUNTER — Encounter (HOSPITAL_COMMUNITY)
Admission: RE | Disposition: A | Discharge status: Home / Self Care | Payer: Self-pay | Source: Ambulatory Visit | Attending: Internal Medicine

## 2016-05-16 ENCOUNTER — Ambulatory Visit (HOSPITAL_COMMUNITY)
Admission: RE | Admit: 2016-05-16 | Discharge: 2016-05-16 | Disposition: A | Discharge status: Home / Self Care | Payer: Medicare HMO | Source: Ambulatory Visit | Attending: Internal Medicine | Admitting: Internal Medicine

## 2016-05-16 DIAGNOSIS — Z885 Allergy status to narcotic agent status: Secondary | ICD-10-CM | POA: Diagnosis not present

## 2016-05-16 DIAGNOSIS — E119 Type 2 diabetes mellitus without complications: Secondary | ICD-10-CM | POA: Insufficient documentation

## 2016-05-16 DIAGNOSIS — Z800 Family history of malignant neoplasm of digestive organs: Secondary | ICD-10-CM | POA: Diagnosis not present

## 2016-05-16 DIAGNOSIS — Z8249 Family history of ischemic heart disease and other diseases of the circulatory system: Secondary | ICD-10-CM | POA: Insufficient documentation

## 2016-05-16 DIAGNOSIS — H8110 Benign paroxysmal vertigo, unspecified ear: Secondary | ICD-10-CM | POA: Diagnosis not present

## 2016-05-16 DIAGNOSIS — Z803 Family history of malignant neoplasm of breast: Secondary | ICD-10-CM | POA: Insufficient documentation

## 2016-05-16 DIAGNOSIS — Z45010 Encounter for checking and testing of cardiac pacemaker pulse generator [battery]: Secondary | ICD-10-CM | POA: Insufficient documentation

## 2016-05-16 DIAGNOSIS — Z808 Family history of malignant neoplasm of other organs or systems: Secondary | ICD-10-CM | POA: Insufficient documentation

## 2016-05-16 DIAGNOSIS — I495 Sick sinus syndrome: Secondary | ICD-10-CM | POA: Insufficient documentation

## 2016-05-16 DIAGNOSIS — Z880 Allergy status to penicillin: Secondary | ICD-10-CM | POA: Insufficient documentation

## 2016-05-16 DIAGNOSIS — R0789 Other chest pain: Secondary | ICD-10-CM | POA: Insufficient documentation

## 2016-05-16 DIAGNOSIS — I10 Essential (primary) hypertension: Secondary | ICD-10-CM | POA: Diagnosis not present

## 2016-05-16 HISTORY — PX: EP IMPLANTABLE DEVICE: SHX172B

## 2016-05-16 LAB — GLUCOSE, CAPILLARY
Glucose-Capillary: 103 mg/dL — ABNORMAL HIGH (ref 65–99)
Glucose-Capillary: 90 mg/dL (ref 65–99)

## 2016-05-16 LAB — SURGICAL PCR SCREEN
MRSA, PCR: NEGATIVE
Staphylococcus aureus: NEGATIVE

## 2016-05-16 SURGERY — PPM/BIV PPM GENERATOR CHANGEOUT
Anesthesia: LOCAL

## 2016-05-16 MED ORDER — SODIUM CHLORIDE 0.9 % IV SOLN
INTRAVENOUS | Status: DC
  Administered 2016-05-16: 12:00:00 via INTRAVENOUS

## 2016-05-16 MED ORDER — SODIUM CHLORIDE 0.9 % IR SOLN
80.0000 mg | Status: AC
  Administered 2016-05-16: 80 mg
  Filled 2016-05-16: qty 2

## 2016-05-16 MED ORDER — SODIUM CHLORIDE 0.9% FLUSH: 3.0000 mL | Freq: Two times a day (BID) | INTRAVENOUS | Status: DC

## 2016-05-16 MED ORDER — VANCOMYCIN HCL IN DEXTROSE 1-5 GM/200ML-% IV SOLN
1000.0000 mg | INTRAVENOUS | Status: AC
  Administered 2016-05-16: 1000 mg via INTRAVENOUS
  Filled 2016-05-16: qty 200

## 2016-05-16 MED ORDER — LIDOCAINE HCL (PF) 1 % IJ SOLN
INTRAMUSCULAR | Status: DC | PRN
  Administered 2016-05-16: 40 mL

## 2016-05-16 MED ORDER — MIDAZOLAM HCL 5 MG/5ML IJ SOLN
INTRAMUSCULAR | Status: DC | PRN
  Administered 2016-05-16: 1 mg via INTRAVENOUS

## 2016-05-16 MED ORDER — FENTANYL CITRATE (PF) 100 MCG/2ML IJ SOLN
INTRAMUSCULAR | Status: DC | PRN
  Administered 2016-05-16: 12.5 ug via INTRAVENOUS

## 2016-05-16 MED ORDER — MUPIROCIN 2 % EX OINT
TOPICAL_OINTMENT | CUTANEOUS | Status: AC
  Filled 2016-05-16: qty 22

## 2016-05-16 MED ORDER — CHLORHEXIDINE GLUCONATE 4 % EX LIQD: 60.0000 mL | Freq: Once | CUTANEOUS | Status: DC

## 2016-05-16 MED ORDER — SODIUM CHLORIDE 0.9% FLUSH: 3.0000 mL | INTRAVENOUS | Status: DC | PRN

## 2016-05-16 MED ORDER — VANCOMYCIN HCL IN DEXTROSE 1-5 GM/200ML-% IV SOLN
INTRAVENOUS | Status: AC
  Filled 2016-05-16: qty 200

## 2016-05-16 MED ORDER — ACETAMINOPHEN 325 MG PO TABS
325.0000 mg | ORAL_TABLET | ORAL | Status: DC | PRN
  Filled 2016-05-16: qty 2

## 2016-05-16 MED ORDER — FENTANYL CITRATE (PF) 100 MCG/2ML IJ SOLN
INTRAMUSCULAR | Status: AC
  Filled 2016-05-16: qty 2

## 2016-05-16 MED ORDER — MIDAZOLAM HCL 5 MG/5ML IJ SOLN
INTRAMUSCULAR | Status: AC
  Filled 2016-05-16: qty 5

## 2016-05-16 MED ORDER — ONDANSETRON HCL 4 MG/2ML IJ SOLN: 4.0000 mg | Freq: Four times a day (QID) | INTRAMUSCULAR | Status: DC | PRN

## 2016-05-16 MED ORDER — MUPIROCIN 2 % EX OINT
1.0000 "application " | TOPICAL_OINTMENT | Freq: Once | CUTANEOUS | Status: AC
  Administered 2016-05-16: 1 via TOPICAL
  Filled 2016-05-16: qty 22

## 2016-05-16 MED ORDER — SODIUM CHLORIDE 0.9 % IR SOLN
Status: AC
  Filled 2016-05-16: qty 2

## 2016-05-16 MED ORDER — SODIUM CHLORIDE 0.9 % IV SOLN: 250.0000 mL | INTRAVENOUS | Status: DC | PRN

## 2016-05-16 MED ORDER — LIDOCAINE HCL (PF) 1 % IJ SOLN
INTRAMUSCULAR | Status: AC
  Filled 2016-05-16: qty 60

## 2016-05-16 SURGICAL SUPPLY — 4 items
CABLE SURGICAL S-101-97-12 (CABLE) ×2 IMPLANT
PACEMAKER ASSURITY DR-RF (Pacemaker) ×2 IMPLANT
PAD DEFIB LIFELINK (PAD) ×2 IMPLANT
TRAY PACEMAKER INSERTION (PACKS) ×2 IMPLANT

## 2016-05-16 NOTE — Discharge Instructions (Signed)
Pacemaker Battery Change, Care After Refer to this sheet in the next few weeks. These instructions provide you with information on caring for yourself after your procedure. Your health care provider may also give you more specific instructions. Your treatment has been planned according to current medical practices, but problems sometimes occur. Call your health care provider if you have any problems or questions after your procedure. WHAT TO EXPECT AFTER THE PROCEDURE After your procedure, it is typical to have the following sensations:  Soreness at the pacemaker site. HOME CARE INSTRUCTIONS   Keep the incision clean and dry.  Remove outer dressing tomorrow and keep site dry; do not get wet for 10 days; do not remove steri-strips the will come off by themselves  For the first week after the replacement, avoid stretching motions that pull at the incision site, and avoid heavy exercise with the arm that is on the same side as the incision.  Take medicines only as directed by your health care provider.  Keep all follow-up visits as directed by your health care provider. SEEK MEDICAL CARE IF:   You have pain at the incision site that is not relieved by over-the-counter or prescription medicine.  There is drainage or pus from the incision site.  There is swelling larger than a lime at the incision site.  You develop red streaking that extends above or below the incision site.  You feel brief, intermittent palpitations, light-headedness, or any symptoms that you feel might be related to your heart. SEEK IMMEDIATE MEDICAL CARE IF:   You experience chest pain that is different than the pain at the pacemaker site.  You experience shortness of breath.  You have palpitations or irregular heartbeat.  You have light-headedness that does not go away quickly.  You faint.  You have pain that gets worse and is not relieved by medicine.   This information is not intended to replace advice  given to you by your health care provider. Make sure you discuss any questions you have with your health care provider.   Document Released: 07/23/2013 Document Revised: 10/23/2014 Document Reviewed: 07/23/2013 Elsevier Interactive Patient Education [employer].

## 2016-05-16 NOTE — H&P (Signed)
History of Present Illness: [name] is a 82 y.o. female who presents today for PPM generator change.  She has reached ERI by voltage.   She has stable SOB.  She has chronic edema.  She has atypical chest pain (under her left chest) which is not exertional.  She has noticed occasional palpitations during the night, over the past few weeks.  She has sleep apnea but does not use CPAP.  Today, she denies symptoms of claudication, dizziness, presyncope, syncope, bleeding, or neurologic sequela. The patient is tolerating medications without difficulties and is otherwise without complaint today.         Past Medical History  Diagnosis Date  . 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 HYPERTENSION 02/24/2008  . CAROTID BRUIT 02/24/2008  . DIABETES MELLITUS, TYPE II 02/24/2008  . Benign paroxysmal positional vertigo 04/06/2010        Past Surgical History  Procedure Laterality Date  . Pacemaker insertion             Current Outpatient Prescriptions  Medication Sig Dispense Refill  . allopurinol (ZYLOPRIM) 100 MG tablet Take 100 mg by mouth daily.      Marland Kitchen amLODipine (NORVASC) 10 MG tablet Take 10 mg by mouth daily.    . AZOPT 1 % ophthalmic suspension Place 1 drop into the right eye 2 (two) times daily.  6  . cetirizine (ZYRTEC) 10 MG tablet Take 10 mg by mouth daily. For allergies    . glimepiride (AMARYL) 1 MG tablet Take 0.5 mg by mouth 2 (two) times daily.     . hydrochlorothiazide (MICROZIDE) 12.5 MG capsule Take 1 capsule (12.5 mg total) by mouth daily. 90 capsule 3  . latanoprost (XALATAN) 0.005 % ophthalmic solution Place 1 drop into both eyes at bedtime.    Marland Kitchen LYRICA 50 MG capsule Take 1 capsule by mouth 2 (two) times daily.    . meclizine (ANTIVERT) 25 MG tablet Take 25 mg by mouth daily as needed for dizziness.    . methazolamide (NEPTAZANE) 25 MG tablet Take 25 mg by mouth daily.  3  . Multiple Vitamin (MULTIVITAMIN WITH MINERALS) TABS tablet Take 1 tablet  by mouth daily.    . naproxen (NAPROSYN) 375 MG tablet Take 1 tablet (375 mg total) by mouth 2 (two) times daily. (Patient taking differently: Take 375 mg by mouth 2 (two) times daily as needed for moderate pain. ) 20 tablet 0  . prednisoLONE acetate (PRED FORTE) 1 % ophthalmic suspension Place 1 drop into the right eye 4 (four) times daily. (Patient taking differently: Place 1 drop into the right eye 2 (two) times daily. ) 5 mL 0  . Pyridoxine HCl (VITAMIN B-6 PO) Take 1 tablet by mouth daily.    Marland Kitchen spironolactone (ALDACTONE) 25 MG tablet Take 25 mg by mouth every other day.     . valsartan (DIOVAN) 160 MG tablet Take 160 mg by mouth daily.  2  . vitamin C (ASCORBIC ACID) 250 MG tablet Take 500 mg by mouth daily.     No current facility-administered medications for this visit.    Allergies:   Aspirin; Diltiazem hcl; Hydrocodone-acetaminophen; Morphine; Penicillins; and Neurontin   Social History:  The patient  reports that she has never smoked. She does not have any smokeless tobacco history on file. She reports that she does not drink alcohol or use illicit drugs.   Family History:  The patient's family history includes Arthritis in her father; Bone cancer in  her father; Breast cancer in her sister; Colon cancer in her sister; Congestive Heart Failure in her mother; Dementia in her sister; Hypertension in her sister; Lymphoma in her mother; Tuberculosis in her mother.   PHYSICAL EXAM: Vitals:   05/16/16 0939  BP: (!) 163/69  Pulse: 75  Resp: 16  Temp: 97.9 F (36.6 C)    GEN: Well nourished, well developed, in no acute distress  HEENT: normal  Neck: no JVD, carotid bruits, or masses Cardiac: RRR; no murmurs, rubs, or gallops,trace edema  Respiratory:  clear to auscultation bilaterally, normal work of breathing GI: soft, nontender, nondistended, + BS MS: no deformity or atrophy  Skin: warm and dry, device pocket is well healed Neuro:  Strength and sensation are  intact Psych: euthymic mood, full affect  Device interrogation is reviewed today in detail.  See PaceArt for details.     Wt Readings from Last 3 Encounters:  12/08/15 191 lb 6.4 oz (86.818 kg)  06/18/15 185 lb (83.915 kg)  03/10/15 184 lb 4.9 oz (83.6 kg)    ASSESSMENT AND PLAN:  1.  Sick sinus Normal pacemaker function She has reached ERI by voltage. Risks, benefits, and alternatives to pacemaker pulse generator replacement were discussed in detail today.  The patient understands that risks include but are not limited to bleeding, infection, pneumothorax, perforation, tamponade, vascular damage, renal failure, MI, stroke, death  damage to his existing leads, and lead dislodgement and wishes to proceed at this time.  Thompson Grayer MD, [facility] 05/16/2016 11:00 AM

## 2016-05-17 ENCOUNTER — Encounter (HOSPITAL_COMMUNITY): Payer: Self-pay | Admitting: Internal Medicine

## 2016-05-23 DIAGNOSIS — H401133 Primary open-angle glaucoma, bilateral, severe stage: Secondary | ICD-10-CM | POA: Diagnosis not present

## 2016-05-24 DIAGNOSIS — I10 Essential (primary) hypertension: Secondary | ICD-10-CM | POA: Diagnosis not present

## 2016-05-24 DIAGNOSIS — G5602 Carpal tunnel syndrome, left upper limb: Secondary | ICD-10-CM | POA: Diagnosis not present

## 2016-05-24 DIAGNOSIS — M150 Primary generalized (osteo)arthritis: Secondary | ICD-10-CM | POA: Diagnosis not present

## 2016-05-24 DIAGNOSIS — M2550 Pain in unspecified joint: Secondary | ICD-10-CM | POA: Diagnosis not present

## 2016-05-24 DIAGNOSIS — E119 Type 2 diabetes mellitus without complications: Secondary | ICD-10-CM | POA: Diagnosis not present

## 2016-05-24 DIAGNOSIS — E559 Vitamin D deficiency, unspecified: Secondary | ICD-10-CM | POA: Diagnosis not present

## 2016-05-25 ENCOUNTER — Telehealth: Payer: Self-pay | Admitting: Internal Medicine

## 2016-05-25 ENCOUNTER — Encounter: Payer: Medicare HMO | Admitting: Nurse Practitioner

## 2016-05-25 NOTE — Telephone Encounter (Signed)
Informed Dr. Lyndel Safe that pts leads were not FDA approved for MRI.

## 2016-05-25 NOTE — Telephone Encounter (Signed)
New message       Request for surgical clearance:  1. What type of surgery is being performed? MRI  When is this surgery scheduled? Not scheduled 2. Are there any medications that need to be held prior to surgery and how long? Calling to see if pt can have an MRI due to pacemaker  3. Name of physician performing surgery? Dr Lyndel Safe  4. What is your office phone and fax number?  Physician did not know fax number

## 2016-05-29 ENCOUNTER — Ambulatory Visit (INDEPENDENT_AMBULATORY_CARE_PROVIDER_SITE_OTHER): Payer: Medicare HMO | Admitting: *Deleted

## 2016-05-29 ENCOUNTER — Encounter: Payer: Self-pay | Admitting: Internal Medicine

## 2016-05-29 DIAGNOSIS — Z950 Presence of cardiac pacemaker: Secondary | ICD-10-CM

## 2016-05-29 DIAGNOSIS — I495 Sick sinus syndrome: Secondary | ICD-10-CM | POA: Diagnosis not present

## 2016-05-29 LAB — CUP PACEART INCLINIC DEVICE CHECK
Battery Remaining Longevity: 120 mo
Battery Voltage: 3.08 V
Brady Statistic RA Percent Paced: 91 %
Brady Statistic RV Percent Paced: 24 %
Date Time Interrogation Session: 20170814134423
Implantable Lead Implant Date: 20100422
Implantable Lead Implant Date: 20100422
Implantable Lead Location: 753859
Implantable Lead Location: 753860
Lead Channel Impedance Value: 387.5 Ohm
Lead Channel Impedance Value: 412.5 Ohm
Lead Channel Pacing Threshold Amplitude: 0.625 V
Lead Channel Pacing Threshold Amplitude: 0.875 V
Lead Channel Pacing Threshold Pulse Width: 0.5 ms
Lead Channel Pacing Threshold Pulse Width: 0.5 ms
Lead Channel Sensing Intrinsic Amplitude: 10.9 mV
Lead Channel Sensing Intrinsic Amplitude: 3.3 mV
Lead Channel Setting Pacing Amplitude: 1.125
Lead Channel Setting Pacing Amplitude: 1.625
Lead Channel Setting Pacing Pulse Width: 0.5 ms
Lead Channel Setting Sensing Sensitivity: 2 mV
Pulse Gen Model: 2272
Pulse Gen Serial Number: 7929552

## 2016-05-29 NOTE — Progress Notes (Signed)
Wound check appointment, s/p gen change on 05/16/16. Steri-strips removed. Wound without redness or edema. Incision edges approximated, wound well healed. Normal device function. Thresholds, sensing, and impedances consistent with implant measurements. Device programmed at chronic outputs, lower rate increased to 70bpm (chronic programming). Histogram distribution appropriate for patient and level of activity. No mode switches or high ventricular rates noted. Patient continues to report palpitations at night, despite VIP on since gen change, Vp reduced to 24%. 8 PMT episodes (none at night), terminated with algorithm, no V-A conduction today, extended PVARP to max 366ms. Patient encouraged to contact Dr. Annamaria Boots regarding her CPAP. Patient educated about wound care, arm mobility, lifting restrictions. ROV in 3 months with JA.

## 2016-05-29 NOTE — Patient Instructions (Signed)
Raquel Sarna, RN will call you in 2 weeks to see how you're feeling.  Call the device clinic at [phone] if your palpitations worsen before this.

## 2016-06-06 DIAGNOSIS — H2513 Age-related nuclear cataract, bilateral: Secondary | ICD-10-CM | POA: Diagnosis not present

## 2016-06-06 DIAGNOSIS — H401133 Primary open-angle glaucoma, bilateral, severe stage: Secondary | ICD-10-CM | POA: Diagnosis not present

## 2016-06-06 DIAGNOSIS — H5340 Unspecified visual field defects: Secondary | ICD-10-CM | POA: Diagnosis not present

## 2016-06-24 DIAGNOSIS — R69 Illness, unspecified: Secondary | ICD-10-CM | POA: Diagnosis not present

## 2016-07-13 DIAGNOSIS — Z23 Encounter for immunization: Secondary | ICD-10-CM | POA: Diagnosis not present

## 2016-07-18 DIAGNOSIS — H401133 Primary open-angle glaucoma, bilateral, severe stage: Secondary | ICD-10-CM | POA: Diagnosis not present

## 2016-08-08 ENCOUNTER — Encounter: Payer: Self-pay | Admitting: Internal Medicine

## 2016-08-21 ENCOUNTER — Encounter: Payer: Self-pay | Admitting: Internal Medicine

## 2016-08-21 ENCOUNTER — Ambulatory Visit (INDEPENDENT_AMBULATORY_CARE_PROVIDER_SITE_OTHER): Payer: Medicare HMO | Admitting: Internal Medicine

## 2016-08-21 VITALS — BP 136/68 | HR 70 | Ht 65.0 in | Wt 195.0 lb

## 2016-08-21 DIAGNOSIS — R002 Palpitations: Secondary | ICD-10-CM | POA: Diagnosis not present

## 2016-08-21 DIAGNOSIS — Z950 Presence of cardiac pacemaker: Secondary | ICD-10-CM | POA: Diagnosis not present

## 2016-08-21 DIAGNOSIS — I10 Essential (primary) hypertension: Secondary | ICD-10-CM | POA: Diagnosis not present

## 2016-08-21 DIAGNOSIS — I495 Sick sinus syndrome: Secondary | ICD-10-CM | POA: Diagnosis not present

## 2016-08-21 DIAGNOSIS — G4733 Obstructive sleep apnea (adult) (pediatric): Secondary | ICD-10-CM

## 2016-08-21 LAB — CUP PACEART INCLINIC DEVICE CHECK
Battery Remaining Longevity: 93 mo
Battery Voltage: 3.02 V
Brady Statistic RA Percent Paced: 98 %
Brady Statistic RV Percent Paced: 60 %
Date Time Interrogation Session: 20171106103655
Implantable Lead Implant Date: 20100422
Implantable Lead Implant Date: 20100422
Implantable Lead Location: 753859
Implantable Lead Location: 753860
Implantable Pulse Generator Implant Date: 20170801
Lead Channel Impedance Value: 387.5 Ohm
Lead Channel Impedance Value: 387.5 Ohm
Lead Channel Pacing Threshold Amplitude: 0.75 V
Lead Channel Pacing Threshold Amplitude: 0.75 V
Lead Channel Pacing Threshold Amplitude: 0.75 V
Lead Channel Pacing Threshold Amplitude: 0.75 V
Lead Channel Pacing Threshold Pulse Width: 0.5 ms
Lead Channel Pacing Threshold Pulse Width: 0.5 ms
Lead Channel Pacing Threshold Pulse Width: 0.5 ms
Lead Channel Pacing Threshold Pulse Width: 0.5 ms
Lead Channel Sensing Intrinsic Amplitude: 11.5 mV
Lead Channel Sensing Intrinsic Amplitude: 4.2 mV
Lead Channel Setting Pacing Amplitude: 1.625
Lead Channel Setting Pacing Amplitude: 2.5 V
Lead Channel Setting Pacing Pulse Width: 0.5 ms
Lead Channel Setting Sensing Sensitivity: 2 mV
Pulse Gen Model: 2272
Pulse Gen Serial Number: 7929552

## 2016-08-21 NOTE — Progress Notes (Signed)
Electrophysiology Office Note   Date:  08/21/2016   ID:  [name], DOB 06-28-1930, MRN [id number]  PCP:  Foye Spurling, MD   Primary Electrophysiologist: Thompson Grayer, MD    Chief Complaint  Patient presents with  . Follow-up    SSS     History of Present Illness: [name] is a 82 y.o. female who presents today for electrophysiology evaluation.   She has stable SOB.  She has chronic edema.  She has atypical chest pain (under her left chest) which is not exertional.    She has sleep apnea and has an appointment with Dr Halford Chessman soon.  She has occasional nocturnal palpitations.  Today, she denies symptoms of claudication, dizziness, presyncope, syncope, bleeding, or neurologic sequela. The patient is tolerating medications without difficulties and is otherwise without complaint today.    Past Medical History:  Diagnosis Date  . Benign paroxysmal positional vertigo 04/06/2010  . 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 CAROTID BRUIT 02/24/2008  . DIABETES MELLITUS, TYPE II 02/24/2008  . HYPERTENSION 02/24/2008   Past Surgical History:  Procedure Laterality Date  . EP IMPLANTABLE DEVICE N/A 05/16/2016   Procedure: PPM Generator Changeout;  Surgeon: Thompson Grayer, MD;  Location: [facility];  Service: Cardiovascular;  Laterality: N/A;  . PACEMAKER INSERTION       Current Outpatient Prescriptions  Medication Sig Dispense Refill  . allopurinol (ZYLOPRIM) 100 MG tablet Take 100 mg by mouth daily.      Marland Kitchen amLODipine (NORVASC) 10 MG tablet Take 10 mg by mouth daily.    Marland Kitchen glimepiride (AMARYL) 1 MG tablet Take 0.5 mg by mouth 2 (two) times daily.     . hydrochlorothiazide (MICROZIDE) 12.5 MG capsule Take 1 capsule (12.5 mg total) by mouth daily. 90 capsule 3  . latanoprost (XALATAN) 0.005 % ophthalmic solution Place 1 drop into both eyes at bedtime.    Marland Kitchen LYRICA 50 MG capsule Take 1 capsule by mouth 2 (two) times daily.    . meclizine (ANTIVERT) 25 MG tablet Take 25 mg by  mouth daily as needed for dizziness.    . Multiple Vitamin (MULTIVITAMIN WITH MINERALS) TABS tablet Take 1 tablet by mouth daily.    . prednisoLONE acetate (PRED FORTE) 1 % ophthalmic suspension Place 2 drops into the right eye 2 (two) times daily.    . Pyridoxine HCl (VITAMIN B-6 PO) Take 1 tablet by mouth daily.    Marland Kitchen spironolactone (ALDACTONE) 25 MG tablet Take 25 mg by mouth every other day.     . valsartan (DIOVAN) 160 MG tablet Take 160 mg by mouth daily.  2  . vitamin C (ASCORBIC ACID) 250 MG tablet Take 500 mg by mouth daily.     No current facility-administered medications for this visit.     Allergies:   Aspirin; Diltiazem hcl; Hydrocodone-acetaminophen; Morphine; Penicillins; and Neurontin [gabapentin]   Social History:  The patient  reports that she has never smoked. She has never used smokeless tobacco. She reports that she does not drink alcohol or use drugs.   Family History:  The patient's family history includes Arthritis in her father; Bone cancer in her father; Breast cancer in her sister; Colon cancer in her sister; Congestive Heart Failure in her mother; Dementia in her sister; Hypertension in her sister; Lymphoma in her mother; Tuberculosis in her mother.    ROS:  Please see the history of present illness.   All other systems are reviewed and negative.  PHYSICAL EXAM: VS:  BP 136/68   Pulse 70   Ht 5\' 5"  (1.651 m)   Wt 195 lb (88.5 kg)   BMI 32.45 kg/m  , BMI Body mass index is 32.45 kg/m. GEN: Well nourished, well developed, in no acute distress  HEENT: normal  Neck: no JVD, carotid bruits, or masses Cardiac: RRR; no murmurs, rubs, or gallops,trace edema  Respiratory:  clear to auscultation bilaterally, normal work of breathing GI: soft, nontender, nondistended, + BS MS: no deformity or atrophy  Skin: warm and dry, device pocket is well healed Neuro:  Strength and sensation are intact Psych: euthymic mood, full affect  Device interrogation is reviewed  today in detail.  See PaceArt for details.   Recent Labs: 04/17/2016: TSH 3.00 05/11/2016: BUN 21; Creat 1.03; Hemoglobin 11.4; Platelets 212; Potassium 4.0; Sodium 141    Lipid Panel     Component Value Date/Time   CHOL 162 03/09/2015 2143   TRIG 108 03/09/2015 2143   HDL 43 03/09/2015 2143   CHOLHDL 3.8 03/09/2015 2143   VLDL 22 03/09/2015 2143   LDLCALC 97 03/09/2015 2143   ekg today reveals atrial pacing, PR 248 msec  Wt Readings from Last 3 Encounters:  08/21/16 195 lb (88.5 kg)  05/16/16 191 lb (86.6 kg)  04/17/16 191 lb 12.8 oz (87 kg)    ASSESSMENT AND PLAN:  1.  Sick sinus Normal pacemaker function See Claudia Desanctis Art report Due to occasional palpitations with exertion, have turned autocapture off today.  Hopefully this may improve her symptoms.  I do not see arrhythmias to correlate.  2. SOB Chronic Regular exercise is encouraged Weight loss is encouraged She likely has a component of diastolic dysfunction 2 gram sodium restriction advised  3. HTN Stable No change required today  4. Atrial tachycardia Stable No change required today  5. Obesity Body mass index is 32.45 kg/m. Weight loss advised  6. OSA Compliance with CPAP encouarged Follow-up with Dr Halford Chessman.  Current medicines are reviewed at length with the patient today.   The patient does not have concerns regarding her medicines.  The following changes were made today:  none   Follow-up: merlin, return to see EP PA-C in 6 months.  Me or EP-PAC can see again in 1 year.  Army Fossa, MD  08/21/2016 10:22 AM     [facility] [address] [place] [address] [phone] (office) [phone] (fax)

## 2016-08-21 NOTE — Patient Instructions (Signed)
Medication Instructions:  Your physician recommends that you continue on your current medications as directed. Please refer to the Current Medication list given to you today.   Labwork: None ordered   Testing/Procedures: None ordered   Follow-Up: Your physician wants you to follow-up in: 6 months with Dillon Bjork, PA and 12 months with Dr Vallery Ridge will receive a reminder letter in the mail two months in advance. If you don't receive a letter, please call our office to schedule the follow-up appointment.  Remote monitoring is used to monitor your Pacemaker  from home. This monitoring reduces the number of office visits required to check your device to one time per year. It allows [place] to keep an eye on the functioning of your device to ensure it is working properly. You are scheduled for a device check from home on 11/21/15 You may send your transmission at any time that day. If you have a wireless device, the transmission will be sent automatically. After your physician reviews your transmission, you will receive a postcard with your next transmission date.     Any Other Special Instructions Will Be Listed Below (If Applicable).     If you need a refill on your cardiac medications before your next appointment, please call your pharmacy.

## 2016-08-23 ENCOUNTER — Ambulatory Visit (INDEPENDENT_AMBULATORY_CARE_PROVIDER_SITE_OTHER): Payer: Medicare HMO | Admitting: Pulmonary Disease

## 2016-08-23 ENCOUNTER — Encounter: Payer: Self-pay | Admitting: Pulmonary Disease

## 2016-08-23 VITALS — BP 140/88 | HR 75 | Ht 65.0 in | Wt 196.6 lb

## 2016-08-23 DIAGNOSIS — G4733 Obstructive sleep apnea (adult) (pediatric): Secondary | ICD-10-CM | POA: Diagnosis not present

## 2016-08-23 NOTE — Progress Notes (Signed)
   Subjective:    Patient ID: [name], female    DOB: 04-02-1930, 82 y.o.   MRN: [id number]  HPI    Review of Systems  Constitutional: Negative for fever and unexpected weight change.  HENT: Negative for congestion, dental problem, ear pain, nosebleeds, postnasal drip, rhinorrhea, sinus pressure, sneezing, sore throat and trouble swallowing.   Eyes: Negative for redness and itching.  Respiratory: Negative for cough, chest tightness, shortness of breath and wheezing.   Cardiovascular: Negative for palpitations and leg swelling.  Gastrointestinal: Negative for nausea and vomiting.  Genitourinary: Negative for dysuria.  Musculoskeletal: Negative for joint swelling.  Skin: Negative for rash.  Neurological: Negative for headaches.  Hematological: Does not bruise/bleed easily.  Psychiatric/Behavioral: Negative for dysphoric mood. The patient is not nervous/anxious.        Objective:   Physical Exam        Assessment & Plan:

## 2016-08-23 NOTE — Patient Instructions (Signed)
Will arrange for home sleep study Will call to arrange for follow up after sleep study reviewed  

## 2016-08-23 NOTE — Progress Notes (Signed)
Past Surgical History She  has a past surgical history that includes Pacemaker insertion; Cardiac catheterization (N/A, 05/16/2016); Back surgery; Knee surgery (Right); Knee surgery (Left); and Abdominal hysterectomy.  Allergies  Allergen Reactions  . Aspirin Other (See Comments)    REACTION: STOMACH ISSUES WITH DOSE HIGHER THAN 81 MG   . Diltiazem Hcl Rash  . Hydrocodone-Acetaminophen Nausea And Vomiting  . Morphine Nausea And Vomiting  . Penicillins Rash    Patient took injection and tablets, had a reaction. She has taken amoxicillin with no reaction Has patient had a PCN reaction causing immediate rash, facial/tongue/throat swelling, SOB or lightheadedness with hypotension: Yes Has patient had a PCN reaction causing severe rash involving mucus membranes or skin necrosis: Yes Has patient had a PCN reaction that required hospitalization No Has patient had a PCN reaction occurring within the last 10 years: No If all of the above answers are "NO", then m  . Neurontin [Gabapentin] Nausea And Vomiting    Family History Her family history includes Arthritis in her father; Bone cancer in her father; Breast cancer in her sister; Colon cancer in her sister; Congestive Heart Failure in her mother; Dementia in her sister; Hypertension in her sister; Lymphoma in her mother; Tuberculosis in her mother.  Social History She  reports that she has never smoked. She has never used smokeless tobacco. She reports that she does not drink alcohol or use drugs.  Review of systems Constitutional: Negative for fever and unexpected weight change.  HENT: Negative for congestion, dental problem, ear pain, nosebleeds, postnasal drip, rhinorrhea, sinus pressure, sneezing, sore throat and trouble swallowing.   Eyes: Negative for redness and itching.  Respiratory: Negative for cough, chest tightness, shortness of breath and wheezing.   Cardiovascular: Negative for palpitations and leg swelling.  Gastrointestinal:  Negative for nausea and vomiting.  Genitourinary: Negative for dysuria.  Musculoskeletal: Negative for joint swelling.  Skin: Negative for rash.  Neurological: Negative for headaches.  Hematological: Does not bruise/bleed easily.  Psychiatric/Behavioral: Negative for dysphoric mood. The patient is not nervous/anxious.     Current Outpatient Prescriptions on File Prior to Visit  Medication Sig  . allopurinol (ZYLOPRIM) 100 MG tablet Take 100 mg by mouth daily.    Marland Kitchen amLODipine (NORVASC) 10 MG tablet Take 10 mg by mouth daily.  Marland Kitchen glimepiride (AMARYL) 1 MG tablet Take 0.5 mg by mouth 2 (two) times daily.   . hydrochlorothiazide (MICROZIDE) 12.5 MG capsule Take 1 capsule (12.5 mg total) by mouth daily.  Marland Kitchen latanoprost (XALATAN) 0.005 % ophthalmic solution Place 1 drop into both eyes at bedtime.  Marland Kitchen LYRICA 50 MG capsule Take 1 capsule by mouth 2 (two) times daily.  . meclizine (ANTIVERT) 25 MG tablet Take 25 mg by mouth daily as needed for dizziness.  . Multiple Vitamin (MULTIVITAMIN WITH MINERALS) TABS tablet Take 1 tablet by mouth daily.  . prednisoLONE acetate (PRED FORTE) 1 % ophthalmic suspension Place 2 drops into the right eye 2 (two) times daily.  . Pyridoxine HCl (VITAMIN B-6 PO) Take 1 tablet by mouth daily.  Marland Kitchen spironolactone (ALDACTONE) 25 MG tablet Take 25 mg by mouth every other day.   . valsartan (DIOVAN) 160 MG tablet Take 160 mg by mouth daily.  . vitamin C (ASCORBIC ACID) 250 MG tablet Take 500 mg by mouth daily.   No current facility-administered medications on file prior to visit.     Chief Complaint  Patient presents with  . Sleep Consult    Referred by Dr Joylene Grapes.  Sleep Study at [facility] a few years ago. Former CPAP use, stopped d/t feeling like she was choking. Epworth Score: 9    Sleep tests PSG 08/02/04 >> AHI 41, SpO2 low 88%  Pulmonary tests PFT 03/12/08 >> FEV1 2.19 (111%), FEV1% 82, TLC 4.34 (87%), DLCO 60%  Cardiac tests Echo 03/10/15 >> mild LVH, EF 60%  Past  medical history She  has a past medical history of Benign paroxysmal positional vertigo (04/06/2010); BRADYCARDIA (01/27/2009); CAROTID BRUIT (02/24/2008); DIABETES MELLITUS, TYPE II (02/24/2008); and HYPERTENSION (02/24/2008).  Vital signs BP 140/88 (BP Location: Left Arm, Cuff Size: Normal)   Pulse 75   Ht 5\' 5"  (1.651 m)   Wt 196 lb 9.6 oz (89.2 kg)   SpO2 99%   BMI 32.72 kg/m   History of Present Illness [name] is a 82 y.o. female for evaluation of sleep problems.  She had sleep study in 2005 and found to have severe sleep apnea.  She was tried on CPAP.  She wasn't able to tolerate the pressure and mask.  She stopped using CPAP.  She has been followed by cardiology for sick sinus syndrome s/p PM.  She wakes up at night with palpitations and feels like her heart is going to jump out of her chest.  This tends to happen when she is in the middle of a dream.  Her husband says she snores.  Her mouth gets dry at night.  She sleeps on her side.  She goes to sleep at 11 pm.  She falls asleep after 30 minutes.  She wakes up frequently to use the bathroom.  She gets out of bed at 9 am.  She feels tired in the morning.  She occasionally gets morning headaches.  She does not use anything to help her fall sleep.  She drinks 2 cups of coffee in the morning.  She denies sleep walking, sleep talking, bruxism, or nightmares.  There is no history of restless legs.  She denies sleep hallucinations, sleep paralysis, or cataplexy.  The Epworth score is 9 out of 24.   Physical Exam:  General - No distress ENT - No sinus tenderness, no oral exudate, no LAN, no thyromegaly, TM clear, pupils equal/reactive, MP 3 Cardiac - s1s2 regular, no murmur, pulses symmetric Chest - No wheeze/rales/dullness, good air entry, normal respiratory excursion Back - No focal tenderness Abd - Soft, non-tender, no organomegaly, + bowel sounds Ext - No edema Neuro - Normal strength, cranial nerves intact Skin - No  rashes Psych - Normal mood, and behavior  Discussion: She has prior history of sleep apnea.  She continues to have snoring, sleep disruption and daytime sleepiness.  She has history of arrythmias, DM, and HTN.  I am concerned her nocturnal palpitations could be related to sleep apnea.  We discussed how sleep apnea can affect various health problems, including risks for hypertension, cardiovascular disease, and diabetes.  We also discussed how sleep disruption can increase risks for accidents, such as while driving.  Weight loss as a means of improving sleep apnea was also reviewed.  Additional treatment options discussed were CPAP therapy, oral appliance, and surgical intervention.   Assessment/plan:  Snoring with concern for sleep apnea. - will arrange for home sleep study to further assess   Patient Instructions  Will arrange for home sleep study Will call to arrange for follow up after sleep study reviewed     Chesley Mires, M.D. Pager [phone] 08/23/2016, 11:32 AM

## 2016-09-11 DIAGNOSIS — G4733 Obstructive sleep apnea (adult) (pediatric): Secondary | ICD-10-CM | POA: Diagnosis not present

## 2016-09-20 DIAGNOSIS — I10 Essential (primary) hypertension: Secondary | ICD-10-CM | POA: Diagnosis not present

## 2016-09-20 DIAGNOSIS — G5602 Carpal tunnel syndrome, left upper limb: Secondary | ICD-10-CM | POA: Diagnosis not present

## 2016-09-20 DIAGNOSIS — E119 Type 2 diabetes mellitus without complications: Secondary | ICD-10-CM | POA: Diagnosis not present

## 2016-09-20 DIAGNOSIS — M150 Primary generalized (osteo)arthritis: Secondary | ICD-10-CM | POA: Diagnosis not present

## 2016-09-27 ENCOUNTER — Telehealth: Payer: Self-pay | Admitting: Pulmonary Disease

## 2016-09-27 DIAGNOSIS — G4733 Obstructive sleep apnea (adult) (pediatric): Secondary | ICD-10-CM

## 2016-09-27 NOTE — Telephone Encounter (Signed)
HST 09/11/16 >> AHI 10, SaO2 low 85%   Will have my nurse inform pt that sleep study shows mild sleep apnea.  Options are 1) CPAP now, 2) ROV first.  If She is agreeable to CPAP, then please send order for auto CPAP range 5 to 15 cm H2O with heated humidity and mask of choice.  Have download sent 1 month after starting CPAP and set up ROV 2 months after starting CPAP.  ROV can be with me or NP.

## 2016-09-28 ENCOUNTER — Other Ambulatory Visit: Payer: Self-pay | Admitting: *Deleted

## 2016-09-28 DIAGNOSIS — G4733 Obstructive sleep apnea (adult) (pediatric): Secondary | ICD-10-CM

## 2016-09-29 DIAGNOSIS — R69 Illness, unspecified: Secondary | ICD-10-CM | POA: Diagnosis not present

## 2016-10-06 NOTE — Telephone Encounter (Signed)
Spoke with pt and notified of results per Dr. Halford Chessman. Pt verbalized understanding and denied any questions. She prefers CPAP start and so I sent the order to [facility]  She is aware to call for 2 month rov with VS or NP after she gets her machine

## 2016-10-12 DIAGNOSIS — G4733 Obstructive sleep apnea (adult) (pediatric): Secondary | ICD-10-CM | POA: Diagnosis not present

## 2016-10-31 DIAGNOSIS — H401133 Primary open-angle glaucoma, bilateral, severe stage: Secondary | ICD-10-CM | POA: Diagnosis not present

## 2016-11-06 DIAGNOSIS — R69 Illness, unspecified: Secondary | ICD-10-CM | POA: Diagnosis not present

## 2016-11-12 DIAGNOSIS — G4733 Obstructive sleep apnea (adult) (pediatric): Secondary | ICD-10-CM | POA: Diagnosis not present

## 2016-11-20 ENCOUNTER — Ambulatory Visit (INDEPENDENT_AMBULATORY_CARE_PROVIDER_SITE_OTHER): Payer: Medicare HMO | Admitting: *Deleted

## 2016-11-20 DIAGNOSIS — I495 Sick sinus syndrome: Secondary | ICD-10-CM

## 2016-11-21 LAB — CUP PACEART REMOTE DEVICE CHECK
Battery Remaining Longevity: 92 mo
Battery Remaining Percentage: 95.5 %
Battery Voltage: 3.01 V
Brady Statistic AP VP Percent: 60 %
Brady Statistic AP VS Percent: 39 %
Brady Statistic AS VP Percent: 1 %
Brady Statistic AS VS Percent: 1.2 %
Brady Statistic RA Percent Paced: 97 %
Brady Statistic RV Percent Paced: 60 %
Date Time Interrogation Session: 20180205085317
Implantable Lead Implant Date: 20100422
Implantable Lead Implant Date: 20100422
Implantable Lead Location: 753859
Implantable Lead Location: 753860
Implantable Pulse Generator Implant Date: 20170801
Lead Channel Impedance Value: 390 Ohm
Lead Channel Impedance Value: 430 Ohm
Lead Channel Pacing Threshold Amplitude: 0.75 V
Lead Channel Pacing Threshold Amplitude: 0.75 V
Lead Channel Pacing Threshold Pulse Width: 0.5 ms
Lead Channel Pacing Threshold Pulse Width: 0.5 ms
Lead Channel Sensing Intrinsic Amplitude: 4.2 mV
Lead Channel Sensing Intrinsic Amplitude: 9.5 mV
Lead Channel Setting Pacing Amplitude: 1.75 V
Lead Channel Setting Pacing Amplitude: 2.5 V
Lead Channel Setting Pacing Pulse Width: 0.5 ms
Lead Channel Setting Sensing Sensitivity: 2 mV
Pulse Gen Model: 2272
Pulse Gen Serial Number: 7929552

## 2016-11-21 NOTE — Progress Notes (Signed)
Remote pacemaker transmission.   

## 2016-11-22 ENCOUNTER — Encounter: Payer: Self-pay | Admitting: Cardiology

## 2016-11-30 DIAGNOSIS — I10 Essential (primary) hypertension: Secondary | ICD-10-CM | POA: Diagnosis not present

## 2016-11-30 DIAGNOSIS — H04129 Dry eye syndrome of unspecified lacrimal gland: Secondary | ICD-10-CM | POA: Diagnosis not present

## 2016-11-30 DIAGNOSIS — E1140 Type 2 diabetes mellitus with diabetic neuropathy, unspecified: Secondary | ICD-10-CM | POA: Diagnosis not present

## 2016-11-30 DIAGNOSIS — K219 Gastro-esophageal reflux disease without esophagitis: Secondary | ICD-10-CM | POA: Diagnosis not present

## 2016-11-30 DIAGNOSIS — B0231 Zoster conjunctivitis: Secondary | ICD-10-CM | POA: Diagnosis not present

## 2016-11-30 DIAGNOSIS — B0229 Other postherpetic nervous system involvement: Secondary | ICD-10-CM | POA: Diagnosis not present

## 2016-11-30 DIAGNOSIS — K581 Irritable bowel syndrome with constipation: Secondary | ICD-10-CM | POA: Diagnosis not present

## 2016-11-30 DIAGNOSIS — H409 Unspecified glaucoma: Secondary | ICD-10-CM | POA: Diagnosis not present

## 2016-11-30 DIAGNOSIS — M109 Gout, unspecified: Secondary | ICD-10-CM | POA: Diagnosis not present

## 2016-11-30 DIAGNOSIS — E669 Obesity, unspecified: Secondary | ICD-10-CM | POA: Diagnosis not present

## 2016-12-13 DIAGNOSIS — G4733 Obstructive sleep apnea (adult) (pediatric): Secondary | ICD-10-CM | POA: Diagnosis not present

## 2016-12-19 DIAGNOSIS — H2513 Age-related nuclear cataract, bilateral: Secondary | ICD-10-CM | POA: Diagnosis not present

## 2016-12-19 DIAGNOSIS — H4041X3 Glaucoma secondary to eye inflammation, right eye, severe stage: Secondary | ICD-10-CM | POA: Diagnosis not present

## 2017-01-04 DIAGNOSIS — I10 Essential (primary) hypertension: Secondary | ICD-10-CM | POA: Diagnosis not present

## 2017-01-04 DIAGNOSIS — G5602 Carpal tunnel syndrome, left upper limb: Secondary | ICD-10-CM | POA: Diagnosis not present

## 2017-01-04 DIAGNOSIS — M150 Primary generalized (osteo)arthritis: Secondary | ICD-10-CM | POA: Diagnosis not present

## 2017-01-04 DIAGNOSIS — E119 Type 2 diabetes mellitus without complications: Secondary | ICD-10-CM | POA: Diagnosis not present

## 2017-01-04 DIAGNOSIS — E559 Vitamin D deficiency, unspecified: Secondary | ICD-10-CM | POA: Diagnosis not present

## 2017-01-10 DIAGNOSIS — G4733 Obstructive sleep apnea (adult) (pediatric): Secondary | ICD-10-CM | POA: Diagnosis not present

## 2017-02-09 ENCOUNTER — Ambulatory Visit (INDEPENDENT_AMBULATORY_CARE_PROVIDER_SITE_OTHER): Payer: Medicare HMO | Admitting: Physician Assistant

## 2017-02-09 ENCOUNTER — Encounter (INDEPENDENT_AMBULATORY_CARE_PROVIDER_SITE_OTHER): Payer: Self-pay

## 2017-02-09 VITALS — HR 70 | Ht 65.0 in | Wt 194.0 lb

## 2017-02-09 DIAGNOSIS — Z950 Presence of cardiac pacemaker: Secondary | ICD-10-CM | POA: Diagnosis not present

## 2017-02-09 DIAGNOSIS — I10 Essential (primary) hypertension: Secondary | ICD-10-CM | POA: Diagnosis not present

## 2017-02-09 DIAGNOSIS — I471 Supraventricular tachycardia: Secondary | ICD-10-CM | POA: Diagnosis not present

## 2017-02-09 DIAGNOSIS — I495 Sick sinus syndrome: Secondary | ICD-10-CM | POA: Diagnosis not present

## 2017-02-09 NOTE — Patient Instructions (Signed)
Medication Instructions:   Your physician recommends that you continue on your current medications as directed. Please refer to the Current Medication list given to you today.   If you need a refill on your cardiac medications before your next appointment, please call your pharmacy.  Labwork: NONE ORDERED  TODAY    Testing/Procedures: NONE ORDERED  TODAY    Follow-Up:  Your physician wants you to follow-up in: [place] will receive a reminder letter in the mail two months in advance. If you don't receive a letter, please call our office to schedule the follow-up appointment.      Remote monitoring is used to monitor your Pacemaker of ICD from home. This monitoring reduces the number of office visits required to check your device to one time per year. It allows [place] to keep an eye on the functioning of your device to ensure it is working properly. You are scheduled for a device check from home on . 7*27*18 You may send your transmission at any time that day. If you have a wireless device, the transmission will be sent automatically. After your physician reviews your transmission, you will receive a postcard with your next transmission date.     Any Other Special Instructions Will Be Listed Below (If Applicable).

## 2017-02-09 NOTE — Progress Notes (Signed)
Cardiology Office Note Date:  02/09/2017  Patient ID:  [name], DOB [birth year], MRN [id number] PCP:  Foye Spurling, MD  Electrophysiologist:  Dr. Rayann Heman   Chief Complaint: ptient states was a previously scheduled visit  History of Present Illness: [name] is a 83 y.o. female with history of strial tachycardia/bradycardia s/p PPM, HTN, DM, and vertigo, comes today to be seen for Dr. Rayann Heman.  She was last seen by him in November, at that time f/u post gen change with mention of nocturnal palpitations, no correlating arrhythmias and atocapture was turned off in effort to reduce her symptoms.    She is using her CPAP and her nighttime symptoms are much improved, not resolved, she will occasionally still feel like her heart is beating strong/pounding, not fast, but hard and in her ears.  She feels better.  Her hip is bothering her, this is her primary complaint.  No CP or SOB, no dizziness, near syncope or syncope.  She did have trouble with Vertigo in January, but resolved.  She unfortunately c/w left eye problems ongoing from shingles episode she had 2 years ago.  They tell her her vision will not return to normal but the irritation may eventually resolve. She denies any bleeding or signs of bleeding.   DEVICE information:   STJ dual chamber PPM, gen change 05/16/16, original implant 02/04/09, Dr. Gerarda Gunther  Past Medical History:  Diagnosis Date  . Benign paroxysmal positional vertigo 04/06/2010  . 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 CAROTID BRUIT 02/24/2008  . DIABETES MELLITUS, TYPE II 02/24/2008  . HYPERTENSION 02/24/2008    Past Surgical History:  Procedure Laterality Date  . ABDOMINAL HYSTERECTOMY     1980's  . BACK SURGERY     x 3  . EP IMPLANTABLE DEVICE N/A 05/16/2016   Procedure: PPM Generator Changeout;  Surgeon: Thompson Grayer, MD;  Location: [facility];  Service: Cardiovascular;  Laterality: N/A;  . KNEE SURGERY Right    2003  . KNEE SURGERY Left      2011  . PACEMAKER INSERTION      Current Outpatient Prescriptions  Medication Sig Dispense Refill  . allopurinol (ZYLOPRIM) 100 MG tablet Take 100 mg by mouth daily.      Marland Kitchen amLODipine (NORVASC) 10 MG tablet Take 10 mg by mouth daily.    Marland Kitchen glimepiride (AMARYL) 1 MG tablet Take 0.5 mg by mouth 2 (two) times daily.     . hydrochlorothiazide (MICROZIDE) 12.5 MG capsule Take 1 capsule (12.5 mg total) by mouth daily. 90 capsule 3  . latanoprost (XALATAN) 0.005 % ophthalmic solution Place 1 drop into both eyes at bedtime.    Marland Kitchen LYRICA 50 MG capsule Take 1 capsule by mouth 2 (two) times daily.    . meclizine (ANTIVERT) 25 MG tablet Take 25 mg by mouth daily as needed for dizziness.    . Multiple Vitamin (MULTIVITAMIN WITH MINERALS) TABS tablet Take 1 tablet by mouth daily.    . prednisoLONE acetate (PRED FORTE) 1 % ophthalmic suspension Place 2 drops into the right eye 2 (two) times daily.    . Pyridoxine HCl (VITAMIN B-6 PO) Take 1 tablet by mouth daily.    Marland Kitchen spironolactone (ALDACTONE) 25 MG tablet Take 25 mg by mouth every other day.     . valsartan (DIOVAN) 160 MG tablet Take 160 mg by mouth daily.  2  . vitamin C (ASCORBIC ACID) 250 MG tablet Take 500 mg by mouth daily.  No current facility-administered medications for this visit.     Allergies:   Aspirin; Diltiazem hcl; Hydrocodone-acetaminophen; Morphine; Penicillins; and Neurontin [gabapentin]   Social History:  The patient  reports that she has never smoked. She has never used smokeless tobacco. She reports that she does not drink alcohol or use drugs.   Family History:  The patient's family history includes Arthritis in her father; Bone cancer in her father; Breast cancer in her sister; Colon cancer in her sister; Congestive Heart Failure in her mother; Dementia in her sister; Hypertension in her sister; Lymphoma in her mother; Tuberculosis in her mother.  ROS:  Please see the history of present illness.  All other systems are  reviewed and otherwise negative.   PHYSICAL EXAM:  VS:  There were no vitals taken for this visit. BMI: There is no height or weight on file to calculate BMI. Well nourished, well developed, in no acute distress  HEENT: normocephalic, atraumatic  Neck: no JVD, carotid bruits or masses Cardiac:  RRR; no significant murmurs, no rubs, or gallops Lungs:  CTA b/l, no wheezing, rhonchi or rales  Abd: soft, nontender MS: no deformity or atrophy Ext: no edema  Skin: warm and dry, no rash Neuro:  No gross deficits appreciated Psych: euthymic mood, full affect  PPM site is stable, no tethering or discomfort   EKG:  Done 05/16/16 A paced, V sensed PPM interrogation today by industry and reviewed by myself: battery and lead measurements are good, 65% V paced, AMS are 10 in total are AT , longest 6 seconds.  03/10/15: Echocardiogram Study Conclusions - Left ventricle: The cavity size was normal. Wall thickness was  increased in a pattern of mild LVH. The estimated ejection  fraction was 60%. Wall motion was normal; there were no regional  wall motion abnormalities. - Right ventricle: The cavity size was normal. Pacer wire or  catheter noted in right ventricle. Systolic function was normal.  03/01/14: Lexiscan stress test Impression Exercise Capacity: Lexiscan with no exercise. BP Response: Normal blood pressure response. Clinical Symptoms: chest pressure and throat tightness ECG Impression: No significant ST segment change suggestive of ischemia. Comparison with Prior Nuclear Study: No images to compare Overall Impression: Low risk stress nuclear study Fixed defec in the mid and basal inferior and inferolateral walls and apical lateral wall consistent with diaphragmatic attenuation. No evidence of ischemia.+.  Recent Labs: 04/17/2016: TSH 3.00 05/11/2016: BUN 21; Creat 1.03; Hemoglobin 11.4; Platelets 212; Potassium 4.0; Sodium 141  No results found for requested labs within last  8760 hours.   CrCl cannot be calculated (Patient's most recent lab result is older than the maximum 21 days allowed.).   Wt Readings from Last 3 Encounters:  08/23/16 196 lb 9.6 oz (89.2 kg)  08/21/16 195 lb (88.5 kg)  05/16/16 191 lb (86.6 kg)     Other studies reviewed: Additional studies/records reviewed today include: summarized above    ASSESSMENT AND PLAN:  1. Sick sinus Syndrome Normal pacemaker function. No changes made AR is 390 ms or so, she V paces 65%, she is doing well and no programming changes made, concerned extending AV delay out to/past 476ms in effort to avoid V pacing may produce symptoms   2. SOB Chronic by her notes, she denies this c/o again to me today Regular exercise is encouraged, though having significant trouble with her hip of late Weight loss is redicsussed She likely has a component of diastolic dysfunction 2 gram sodium restriction re-sdiscussed as well  3. HTN Stable No change required today  4. Atrial tachycardia Minimal, only a few seconds at a time and only 10 since last check    5. OSA untreated  using CPAP and feeling much better  Disposition: Q 3 month remote pacer checks, Dr. Rayann Heman in 1 year, sooner if needed.   Current medicines are reviewed at length with the patient today.  The patient did not have any concerns regarding medicines.  Haywood Lasso, PA-C 02/09/2017 5:10 AM     [facility] [address] [place] [address] [phone] (office)  [phone] (fax)

## 2017-02-10 DIAGNOSIS — G4733 Obstructive sleep apnea (adult) (pediatric): Secondary | ICD-10-CM | POA: Diagnosis not present

## 2017-02-20 DIAGNOSIS — H2513 Age-related nuclear cataract, bilateral: Secondary | ICD-10-CM | POA: Diagnosis not present

## 2017-02-20 DIAGNOSIS — H4041X3 Glaucoma secondary to eye inflammation, right eye, severe stage: Secondary | ICD-10-CM | POA: Diagnosis not present

## 2017-02-27 DIAGNOSIS — M150 Primary generalized (osteo)arthritis: Secondary | ICD-10-CM | POA: Diagnosis not present

## 2017-02-27 DIAGNOSIS — I10 Essential (primary) hypertension: Secondary | ICD-10-CM | POA: Diagnosis not present

## 2017-02-27 DIAGNOSIS — E119 Type 2 diabetes mellitus without complications: Secondary | ICD-10-CM | POA: Diagnosis not present

## 2017-02-27 DIAGNOSIS — J40 Bronchitis, not specified as acute or chronic: Secondary | ICD-10-CM | POA: Diagnosis not present

## 2017-03-12 DIAGNOSIS — G4733 Obstructive sleep apnea (adult) (pediatric): Secondary | ICD-10-CM | POA: Diagnosis not present

## 2017-04-12 DIAGNOSIS — G4733 Obstructive sleep apnea (adult) (pediatric): Secondary | ICD-10-CM | POA: Diagnosis not present

## 2017-05-02 DIAGNOSIS — M150 Primary generalized (osteo)arthritis: Secondary | ICD-10-CM | POA: Diagnosis not present

## 2017-05-02 DIAGNOSIS — I10 Essential (primary) hypertension: Secondary | ICD-10-CM | POA: Diagnosis not present

## 2017-05-02 DIAGNOSIS — N189 Chronic kidney disease, unspecified: Secondary | ICD-10-CM | POA: Diagnosis not present

## 2017-05-02 DIAGNOSIS — E119 Type 2 diabetes mellitus without complications: Secondary | ICD-10-CM | POA: Diagnosis not present

## 2017-05-02 DIAGNOSIS — G5602 Carpal tunnel syndrome, left upper limb: Secondary | ICD-10-CM | POA: Diagnosis not present

## 2017-05-12 DIAGNOSIS — G4733 Obstructive sleep apnea (adult) (pediatric): Secondary | ICD-10-CM | POA: Diagnosis not present

## 2017-05-24 DIAGNOSIS — E119 Type 2 diabetes mellitus without complications: Secondary | ICD-10-CM | POA: Diagnosis not present

## 2017-05-24 DIAGNOSIS — H401113 Primary open-angle glaucoma, right eye, severe stage: Secondary | ICD-10-CM | POA: Diagnosis not present

## 2017-05-24 DIAGNOSIS — H43813 Vitreous degeneration, bilateral: Secondary | ICD-10-CM | POA: Diagnosis not present

## 2017-05-24 DIAGNOSIS — G4733 Obstructive sleep apnea (adult) (pediatric): Secondary | ICD-10-CM | POA: Diagnosis not present

## 2017-05-24 DIAGNOSIS — H4043X3 Glaucoma secondary to eye inflammation, bilateral, severe stage: Secondary | ICD-10-CM | POA: Diagnosis not present

## 2017-06-12 DIAGNOSIS — G4733 Obstructive sleep apnea (adult) (pediatric): Secondary | ICD-10-CM | POA: Diagnosis not present

## 2017-07-10 DIAGNOSIS — R69 Illness, unspecified: Secondary | ICD-10-CM | POA: Diagnosis not present

## 2017-07-13 DIAGNOSIS — G4733 Obstructive sleep apnea (adult) (pediatric): Secondary | ICD-10-CM | POA: Diagnosis not present

## 2017-08-12 DIAGNOSIS — G4733 Obstructive sleep apnea (adult) (pediatric): Secondary | ICD-10-CM | POA: Diagnosis not present

## 2017-08-14 DIAGNOSIS — I10 Essential (primary) hypertension: Secondary | ICD-10-CM | POA: Diagnosis not present

## 2017-08-14 DIAGNOSIS — G5602 Carpal tunnel syndrome, left upper limb: Secondary | ICD-10-CM | POA: Diagnosis not present

## 2017-08-14 DIAGNOSIS — E119 Type 2 diabetes mellitus without complications: Secondary | ICD-10-CM | POA: Diagnosis not present

## 2017-08-14 DIAGNOSIS — M150 Primary generalized (osteo)arthritis: Secondary | ICD-10-CM | POA: Diagnosis not present

## 2017-08-27 DIAGNOSIS — G5602 Carpal tunnel syndrome, left upper limb: Secondary | ICD-10-CM | POA: Diagnosis not present

## 2017-08-27 DIAGNOSIS — I10 Essential (primary) hypertension: Secondary | ICD-10-CM | POA: Diagnosis not present

## 2017-08-27 DIAGNOSIS — E119 Type 2 diabetes mellitus without complications: Secondary | ICD-10-CM | POA: Diagnosis not present

## 2017-08-27 DIAGNOSIS — M150 Primary generalized (osteo)arthritis: Secondary | ICD-10-CM | POA: Diagnosis not present

## 2017-08-28 DIAGNOSIS — H4043X3 Glaucoma secondary to eye inflammation, bilateral, severe stage: Secondary | ICD-10-CM | POA: Diagnosis not present

## 2017-09-27 DIAGNOSIS — H4043X3 Glaucoma secondary to eye inflammation, bilateral, severe stage: Secondary | ICD-10-CM | POA: Diagnosis not present

## 2017-09-27 DIAGNOSIS — H25813 Combined forms of age-related cataract, bilateral: Secondary | ICD-10-CM | POA: Diagnosis not present

## 2017-10-02 DIAGNOSIS — M150 Primary generalized (osteo)arthritis: Secondary | ICD-10-CM | POA: Diagnosis not present

## 2017-10-02 DIAGNOSIS — I10 Essential (primary) hypertension: Secondary | ICD-10-CM | POA: Diagnosis not present

## 2017-10-02 DIAGNOSIS — E119 Type 2 diabetes mellitus without complications: Secondary | ICD-10-CM | POA: Diagnosis not present

## 2017-10-02 DIAGNOSIS — G5602 Carpal tunnel syndrome, left upper limb: Secondary | ICD-10-CM | POA: Diagnosis not present

## 2017-10-22 DIAGNOSIS — I499 Cardiac arrhythmia, unspecified: Secondary | ICD-10-CM | POA: Diagnosis not present

## 2017-10-22 DIAGNOSIS — I10 Essential (primary) hypertension: Secondary | ICD-10-CM | POA: Diagnosis not present

## 2017-10-22 DIAGNOSIS — E1151 Type 2 diabetes mellitus with diabetic peripheral angiopathy without gangrene: Secondary | ICD-10-CM | POA: Diagnosis not present

## 2017-10-22 DIAGNOSIS — M109 Gout, unspecified: Secondary | ICD-10-CM | POA: Diagnosis not present

## 2017-10-22 DIAGNOSIS — G8929 Other chronic pain: Secondary | ICD-10-CM | POA: Diagnosis not present

## 2017-10-22 DIAGNOSIS — E1140 Type 2 diabetes mellitus with diabetic neuropathy, unspecified: Secondary | ICD-10-CM | POA: Diagnosis not present

## 2017-10-22 DIAGNOSIS — B0229 Other postherpetic nervous system involvement: Secondary | ICD-10-CM | POA: Diagnosis not present

## 2017-10-22 DIAGNOSIS — H409 Unspecified glaucoma: Secondary | ICD-10-CM | POA: Diagnosis not present

## 2017-10-22 DIAGNOSIS — M1990 Unspecified osteoarthritis, unspecified site: Secondary | ICD-10-CM | POA: Diagnosis not present

## 2017-10-22 DIAGNOSIS — H04129 Dry eye syndrome of unspecified lacrimal gland: Secondary | ICD-10-CM | POA: Diagnosis not present

## 2017-11-08 ENCOUNTER — Ambulatory Visit (INDEPENDENT_AMBULATORY_CARE_PROVIDER_SITE_OTHER): Payer: Medicare HMO | Admitting: *Deleted

## 2017-11-08 DIAGNOSIS — I495 Sick sinus syndrome: Secondary | ICD-10-CM | POA: Diagnosis not present

## 2017-11-09 ENCOUNTER — Encounter: Payer: Self-pay | Admitting: Cardiology

## 2017-11-09 NOTE — Progress Notes (Signed)
Remote pacemaker transmission.   

## 2017-11-15 DIAGNOSIS — M130 Polyarthritis, unspecified: Secondary | ICD-10-CM | POA: Diagnosis not present

## 2017-11-15 DIAGNOSIS — E0800 Diabetes mellitus due to underlying condition with hyperosmolarity without nonketotic hyperglycemic-hyperosmolar coma (NKHHC): Secondary | ICD-10-CM | POA: Diagnosis not present

## 2017-12-03 DIAGNOSIS — Z1212 Encounter for screening for malignant neoplasm of rectum: Secondary | ICD-10-CM | POA: Diagnosis not present

## 2017-12-03 DIAGNOSIS — Z1211 Encounter for screening for malignant neoplasm of colon: Secondary | ICD-10-CM | POA: Diagnosis not present

## 2017-12-03 LAB — CUP PACEART REMOTE DEVICE CHECK
Battery Remaining Longevity: 92 mo
Battery Remaining Percentage: 95.5 %
Battery Voltage: 3.01 V
Brady Statistic AP VP Percent: 60 %
Brady Statistic AP VS Percent: 39 %
Brady Statistic AS VP Percent: 1 %
Brady Statistic AS VS Percent: 1.2 %
Brady Statistic RA Percent Paced: 97 %
Brady Statistic RV Percent Paced: 60 %
Date Time Interrogation Session: 20180205085317
Implantable Lead Implant Date: 20100422
Implantable Lead Implant Date: 20100422
Implantable Lead Location: 753859
Implantable Lead Location: 753860
Implantable Pulse Generator Implant Date: 20170801
Lead Channel Impedance Value: 390 Ohm
Lead Channel Impedance Value: 430 Ohm
Lead Channel Pacing Threshold Amplitude: 0.75 V
Lead Channel Pacing Threshold Amplitude: 0.75 V
Lead Channel Pacing Threshold Pulse Width: 0.5 ms
Lead Channel Pacing Threshold Pulse Width: 0.5 ms
Lead Channel Sensing Intrinsic Amplitude: 4.2 mV
Lead Channel Sensing Intrinsic Amplitude: 9.5 mV
Lead Channel Setting Pacing Amplitude: 1.75 V
Lead Channel Setting Pacing Amplitude: 2.5 V
Lead Channel Setting Pacing Pulse Width: 0.5 ms
Lead Channel Setting Sensing Sensitivity: 2 mV
Pulse Gen Model: 2272
Pulse Gen Serial Number: 7929552

## 2017-12-04 DIAGNOSIS — M130 Polyarthritis, unspecified: Secondary | ICD-10-CM | POA: Diagnosis not present

## 2017-12-04 DIAGNOSIS — E0800 Diabetes mellitus due to underlying condition with hyperosmolarity without nonketotic hyperglycemic-hyperosmolar coma (NKHHC): Secondary | ICD-10-CM | POA: Diagnosis not present

## 2017-12-26 DIAGNOSIS — H4043X3 Glaucoma secondary to eye inflammation, bilateral, severe stage: Secondary | ICD-10-CM | POA: Diagnosis not present

## 2017-12-26 DIAGNOSIS — H25813 Combined forms of age-related cataract, bilateral: Secondary | ICD-10-CM | POA: Diagnosis not present

## 2018-01-02 DIAGNOSIS — I83819 Varicose veins of unspecified lower extremities with pain: Secondary | ICD-10-CM | POA: Diagnosis not present

## 2018-01-02 DIAGNOSIS — D239 Other benign neoplasm of skin, unspecified: Secondary | ICD-10-CM | POA: Diagnosis not present

## 2018-01-02 DIAGNOSIS — L309 Dermatitis, unspecified: Secondary | ICD-10-CM | POA: Diagnosis not present

## 2018-01-04 ENCOUNTER — Encounter: Payer: Self-pay | Admitting: Internal Medicine

## 2018-01-15 ENCOUNTER — Ambulatory Visit (HOSPITAL_COMMUNITY)
Admission: RE | Admit: 2018-01-15 | Discharge: 2018-01-15 | Disposition: A | Discharge status: Home / Self Care | Payer: Medicare HMO | Source: Ambulatory Visit | Attending: Family Medicine | Admitting: Family Medicine

## 2018-01-15 ENCOUNTER — Other Ambulatory Visit (HOSPITAL_COMMUNITY): Payer: Self-pay | Admitting: Family Medicine

## 2018-01-15 DIAGNOSIS — M79605 Pain in left leg: Secondary | ICD-10-CM | POA: Insufficient documentation

## 2018-01-15 DIAGNOSIS — M7989 Other specified soft tissue disorders: Secondary | ICD-10-CM

## 2018-01-15 DIAGNOSIS — M940 Chondrocostal junction syndrome [Tietze]: Secondary | ICD-10-CM | POA: Diagnosis not present

## 2018-01-15 DIAGNOSIS — I82409 Acute embolism and thrombosis of unspecified deep veins of unspecified lower extremity: Secondary | ICD-10-CM | POA: Diagnosis not present

## 2018-01-15 NOTE — Progress Notes (Signed)
Left lower extremity venous duplex completed. There is no obvious evidence of DVT,superficial thrombosis, or Baker's cyst. Toma Copier, RVS 01/15/2018 4;21 PM

## 2018-01-16 ENCOUNTER — Ambulatory Visit: Payer: Medicare HMO | Admitting: Internal Medicine

## 2018-01-16 VITALS — Ht 65.0 in | Wt 191.0 lb

## 2018-01-16 DIAGNOSIS — I495 Sick sinus syndrome: Secondary | ICD-10-CM | POA: Diagnosis not present

## 2018-01-16 DIAGNOSIS — I10 Essential (primary) hypertension: Secondary | ICD-10-CM

## 2018-01-16 DIAGNOSIS — Z950 Presence of cardiac pacemaker: Secondary | ICD-10-CM | POA: Diagnosis not present

## 2018-01-16 DIAGNOSIS — G4733 Obstructive sleep apnea (adult) (pediatric): Secondary | ICD-10-CM | POA: Diagnosis not present

## 2018-01-16 NOTE — Patient Instructions (Signed)
Medication Instructions:  Your physician recommends that you continue on your current medications as directed. Please refer to the Current Medication list given to you today.  Labwork: None ordered.  Testing/Procedures: None ordered.  Follow-Up: Your physician wants you to follow-up in: one year with Chanetta Marshall, NP.   You will receive a reminder letter in the mail two months in advance. If you don't receive a letter, please call our office to schedule the follow-up appointment.  Remote monitoring is used to monitor your Pacemaker from home. This monitoring reduces the number of office visits required to check your device to one time per year. It allows [place] to keep an eye on the functioning of your device to ensure it is working properly. You are scheduled for a device check from home on 02/07/2018. You may send your transmission at any time that day. If you have a wireless device, the transmission will be sent automatically. After your physician reviews your transmission, you will receive a postcard with your next transmission date.  Any Other Special Instructions Will Be Listed Below (If Applicable).  If you need a refill on your cardiac medications before your next appointment, please call your pharmacy.

## 2018-01-16 NOTE — Progress Notes (Signed)
PCP: Lucianne Lei, MD   Primary EP:  Dr Rayann Heman  [name] is a 84 y.o. female who presents today for routine electrophysiology followup.  Since last being seen in our clinic, the patient reports doing very well. She has rare palpitations at night.  + edema (chronic).  Exercises when able.  Caring for her husband who has cognitive impairment.  Today, she denies symptoms of chest pain, shortness of breath,  dizziness, presyncope, or syncope.  The patient is otherwise without complaint today.   Past Medical History:  Diagnosis Date  . Benign paroxysmal positional vertigo 04/06/2010  . 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 CAROTID BRUIT 02/24/2008  . DIABETES MELLITUS, TYPE II 02/24/2008  . HYPERTENSION 02/24/2008   Past Surgical History:  Procedure Laterality Date  . ABDOMINAL HYSTERECTOMY     1980's  . BACK SURGERY     x 3  . EP IMPLANTABLE DEVICE N/A 05/16/2016   Procedure: PPM Generator Changeout;  Surgeon: Thompson Grayer, MD;  Location: [facility];  Service: Cardiovascular;  Laterality: N/A;  . KNEE SURGERY Right    2003  . KNEE SURGERY Left    2011  . PACEMAKER INSERTION      ROS- all systems are reviewed and negative except as per HPI above  Current Outpatient Medications  Medication Sig Dispense Refill  . allopurinol (ZYLOPRIM) 100 MG tablet Take 100 mg by mouth daily.      Marland Kitchen amLODipine (NORVASC) 10 MG tablet Take 10 mg by mouth daily.    . Coenzyme Q10 (CO Q 10 PO) Take by mouth daily.    . dorzolamide (TRUSOPT) 2 % ophthalmic solution Place 1 drop into the left eye 3 (three) times daily.  1  . glimepiride (AMARYL) 1 MG tablet Take 0.5 mg by mouth 2 (two) times daily.     . hydrochlorothiazide (MICROZIDE) 12.5 MG capsule Take 1 capsule (12.5 mg total) by mouth daily. 90 capsule 3  . irbesartan (AVAPRO) 150 MG tablet Take 150 mg by mouth daily.  3  . latanoprost (XALATAN) 0.005 % ophthalmic solution Place 1 drop into both eyes at bedtime.    Marland Kitchen LYRICA 50 MG  capsule Take 1 capsule by mouth 2 (two) times daily.    . meclizine (ANTIVERT) 25 MG tablet Take 25 mg by mouth daily as needed for dizziness.    . Multiple Vitamin (MULTIVITAMIN WITH MINERALS) TABS tablet Take 1 tablet by mouth daily.    . prednisoLONE acetate (PRED FORTE) 1 % ophthalmic suspension Place 2 drops into the right eye 2 (two) times daily.    . valACYclovir (VALTREX) 500 MG tablet     . vitamin C (ASCORBIC ACID) 250 MG tablet Take 500 mg by mouth daily.     No current facility-administered medications for this visit.     Physical Exam: Vitals:   01/16/18 1131  Weight: 86.6 kg (191 lb)  Height: 5\' 5"  (1.651 m)    GEN- The patient is well appearing, alert and oriented x 3 today.   Head- normocephalic, atraumatic Eyes-  Sclera clear, conjunctiva pink Ears- hearing intact Oropharynx- clear Lungs- Clear to ausculation bilaterally, normal work of breathing Chest- pacemaker pocket is well healed Heart- Regular rate and rhythm, no murmurs, rubs or gallops, PMI not laterally displaced GI- soft, NT, ND, + BS Extremities- no clubbing, cyanosis, or edema  Pacemaker interrogation- reviewed in detail today,  See PACEART report  ekg tracing ordered today is personally reviewed  and shows AV paced rhythm  Assessment and Plan:  1. Symptomatic sick sinus (primary indication) and second degree AV block Normal pacemaker function See Claudia Desanctis Art report She has episodes of accelerated junctional rhythm which may be the cause of symptoms of palpitations.  She also has V pacing > 60% despite very long AV programming.  Will therefore shorting AV delay to 200 msec today.  No other changes  2. HTN Elevated today She reports good control at home and does not wish to make changes today No change required today  3. atach Stable Well controlled No change required today  4. OSA Compliant with CPAP  Merlin Return to see EP NP in a year  Thompson Grayer MD, [facility] 01/16/2018 11:40 AM

## 2018-01-31 LAB — CUP PACEART INCLINIC DEVICE CHECK
Battery Remaining Longevity: 91 mo
Battery Voltage: 2.99 V
Brady Statistic RA Percent Paced: 98 %
Brady Statistic RV Percent Paced: 62 %
Date Time Interrogation Session: 20190403152501
Implantable Lead Implant Date: 20100422
Implantable Lead Implant Date: 20100422
Implantable Lead Location: 753859
Implantable Lead Location: 753860
Implantable Pulse Generator Implant Date: 20170801
Lead Channel Impedance Value: 387.5 Ohm
Lead Channel Impedance Value: 387.5 Ohm
Lead Channel Pacing Threshold Amplitude: 0.75 V
Lead Channel Pacing Threshold Amplitude: 1 V
Lead Channel Pacing Threshold Pulse Width: 0.5 ms
Lead Channel Pacing Threshold Pulse Width: 0.5 ms
Lead Channel Sensing Intrinsic Amplitude: 2.6 mV
Lead Channel Sensing Intrinsic Amplitude: 9.1 mV
Lead Channel Setting Pacing Amplitude: 2 V
Lead Channel Setting Pacing Amplitude: 2.5 V
Lead Channel Setting Pacing Pulse Width: 0.5 ms
Lead Channel Setting Sensing Sensitivity: 2 mV
Pulse Gen Model: 2272
Pulse Gen Serial Number: 7929552

## 2018-02-06 DIAGNOSIS — M10032 Idiopathic gout, left wrist: Secondary | ICD-10-CM | POA: Diagnosis not present

## 2018-02-07 ENCOUNTER — Ambulatory Visit (INDEPENDENT_AMBULATORY_CARE_PROVIDER_SITE_OTHER): Payer: Medicare HMO | Admitting: *Deleted

## 2018-02-07 DIAGNOSIS — I495 Sick sinus syndrome: Secondary | ICD-10-CM

## 2018-02-08 ENCOUNTER — Encounter: Payer: Self-pay | Admitting: Cardiology

## 2018-02-08 NOTE — Progress Notes (Signed)
Remote pacemaker transmission.   

## 2018-02-11 DIAGNOSIS — M25542 Pain in joints of left hand: Secondary | ICD-10-CM | POA: Diagnosis not present

## 2018-02-11 DIAGNOSIS — M25642 Stiffness of left hand, not elsewhere classified: Secondary | ICD-10-CM | POA: Diagnosis not present

## 2018-02-11 DIAGNOSIS — M6281 Muscle weakness (generalized): Secondary | ICD-10-CM | POA: Diagnosis not present

## 2018-02-11 DIAGNOSIS — M25541 Pain in joints of right hand: Secondary | ICD-10-CM | POA: Diagnosis not present

## 2018-02-14 DIAGNOSIS — M25542 Pain in joints of left hand: Secondary | ICD-10-CM | POA: Diagnosis not present

## 2018-02-14 DIAGNOSIS — M6281 Muscle weakness (generalized): Secondary | ICD-10-CM | POA: Diagnosis not present

## 2018-02-14 DIAGNOSIS — M25541 Pain in joints of right hand: Secondary | ICD-10-CM | POA: Diagnosis not present

## 2018-02-14 DIAGNOSIS — M25642 Stiffness of left hand, not elsewhere classified: Secondary | ICD-10-CM | POA: Diagnosis not present

## 2018-02-20 DIAGNOSIS — H25813 Combined forms of age-related cataract, bilateral: Secondary | ICD-10-CM | POA: Diagnosis not present

## 2018-02-20 DIAGNOSIS — H4043X3 Glaucoma secondary to eye inflammation, bilateral, severe stage: Secondary | ICD-10-CM | POA: Diagnosis not present

## 2018-02-20 DIAGNOSIS — E119 Type 2 diabetes mellitus without complications: Secondary | ICD-10-CM | POA: Diagnosis not present

## 2018-02-21 DIAGNOSIS — M25541 Pain in joints of right hand: Secondary | ICD-10-CM | POA: Diagnosis not present

## 2018-02-21 DIAGNOSIS — M25542 Pain in joints of left hand: Secondary | ICD-10-CM | POA: Diagnosis not present

## 2018-02-21 DIAGNOSIS — M25642 Stiffness of left hand, not elsewhere classified: Secondary | ICD-10-CM | POA: Diagnosis not present

## 2018-02-21 DIAGNOSIS — M6281 Muscle weakness (generalized): Secondary | ICD-10-CM | POA: Diagnosis not present

## 2018-02-26 DIAGNOSIS — M25531 Pain in right wrist: Secondary | ICD-10-CM | POA: Diagnosis not present

## 2018-02-27 DIAGNOSIS — M25642 Stiffness of left hand, not elsewhere classified: Secondary | ICD-10-CM | POA: Diagnosis not present

## 2018-02-27 DIAGNOSIS — M25541 Pain in joints of right hand: Secondary | ICD-10-CM | POA: Diagnosis not present

## 2018-02-27 DIAGNOSIS — M25542 Pain in joints of left hand: Secondary | ICD-10-CM | POA: Diagnosis not present

## 2018-02-27 DIAGNOSIS — M6281 Muscle weakness (generalized): Secondary | ICD-10-CM | POA: Diagnosis not present

## 2018-03-01 LAB — CUP PACEART REMOTE DEVICE CHECK
Battery Remaining Longevity: 89 mo
Battery Remaining Percentage: 95.5 %
Battery Voltage: 2.99 V
Brady Statistic AP VP Percent: 98 %
Brady Statistic AP VS Percent: 1 %
Brady Statistic AS VP Percent: 1 %
Brady Statistic AS VS Percent: 1 %
Brady Statistic RA Percent Paced: 99 %
Brady Statistic RV Percent Paced: 98 %
Date Time Interrogation Session: 20190425061819
Implantable Lead Implant Date: 20100422
Implantable Lead Implant Date: 20100422
Implantable Lead Location: 753859
Implantable Lead Location: 753860
Implantable Pulse Generator Implant Date: 20170801
Lead Channel Impedance Value: 410 Ohm
Lead Channel Impedance Value: 410 Ohm
Lead Channel Pacing Threshold Amplitude: 0.75 V
Lead Channel Pacing Threshold Amplitude: 1 V
Lead Channel Pacing Threshold Pulse Width: 0.5 ms
Lead Channel Pacing Threshold Pulse Width: 0.5 ms
Lead Channel Sensing Intrinsic Amplitude: 12 mV
Lead Channel Sensing Intrinsic Amplitude: 3 mV
Lead Channel Setting Pacing Amplitude: 2 V
Lead Channel Setting Pacing Amplitude: 2.5 V
Lead Channel Setting Pacing Pulse Width: 0.5 ms
Lead Channel Setting Sensing Sensitivity: 2 mV
Pulse Gen Model: 2272
Pulse Gen Serial Number: 7929552

## 2018-03-05 DIAGNOSIS — M25541 Pain in joints of right hand: Secondary | ICD-10-CM | POA: Diagnosis not present

## 2018-03-05 DIAGNOSIS — M25542 Pain in joints of left hand: Secondary | ICD-10-CM | POA: Diagnosis not present

## 2018-03-05 DIAGNOSIS — M25642 Stiffness of left hand, not elsewhere classified: Secondary | ICD-10-CM | POA: Diagnosis not present

## 2018-03-05 DIAGNOSIS — M6281 Muscle weakness (generalized): Secondary | ICD-10-CM | POA: Diagnosis not present

## 2018-03-15 DIAGNOSIS — Z780 Asymptomatic menopausal state: Secondary | ICD-10-CM | POA: Diagnosis not present

## 2018-03-15 DIAGNOSIS — Z803 Family history of malignant neoplasm of breast: Secondary | ICD-10-CM | POA: Diagnosis not present

## 2018-03-15 DIAGNOSIS — Z1231 Encounter for screening mammogram for malignant neoplasm of breast: Secondary | ICD-10-CM | POA: Diagnosis not present

## 2018-03-15 DIAGNOSIS — M8589 Other specified disorders of bone density and structure, multiple sites: Secondary | ICD-10-CM | POA: Diagnosis not present

## 2018-03-19 DIAGNOSIS — R200 Anesthesia of skin: Secondary | ICD-10-CM | POA: Diagnosis not present

## 2018-04-12 DIAGNOSIS — G4733 Obstructive sleep apnea (adult) (pediatric): Secondary | ICD-10-CM | POA: Diagnosis not present

## 2018-04-17 DIAGNOSIS — R200 Anesthesia of skin: Secondary | ICD-10-CM | POA: Diagnosis not present

## 2018-04-17 DIAGNOSIS — M65341 Trigger finger, right ring finger: Secondary | ICD-10-CM | POA: Diagnosis not present

## 2018-05-01 DIAGNOSIS — M65341 Trigger finger, right ring finger: Secondary | ICD-10-CM | POA: Diagnosis not present

## 2018-05-01 DIAGNOSIS — R200 Anesthesia of skin: Secondary | ICD-10-CM | POA: Diagnosis not present

## 2018-05-02 DIAGNOSIS — M130 Polyarthritis, unspecified: Secondary | ICD-10-CM | POA: Diagnosis not present

## 2018-05-02 DIAGNOSIS — E1100 Type 2 diabetes mellitus with hyperosmolarity without nonketotic hyperglycemic-hyperosmolar coma (NKHHC): Secondary | ICD-10-CM | POA: Diagnosis not present

## 2018-05-02 DIAGNOSIS — Z6835 Body mass index (BMI) 35.0-35.9, adult: Secondary | ICD-10-CM | POA: Diagnosis not present

## 2018-05-09 ENCOUNTER — Ambulatory Visit (INDEPENDENT_AMBULATORY_CARE_PROVIDER_SITE_OTHER): Payer: Medicare HMO | Admitting: *Deleted

## 2018-05-09 DIAGNOSIS — I495 Sick sinus syndrome: Secondary | ICD-10-CM | POA: Diagnosis not present

## 2018-05-09 NOTE — Progress Notes (Signed)
Remote pacemaker transmission.   

## 2018-05-10 DIAGNOSIS — M545 Low back pain: Secondary | ICD-10-CM | POA: Diagnosis not present

## 2018-05-10 DIAGNOSIS — M5416 Radiculopathy, lumbar region: Secondary | ICD-10-CM | POA: Diagnosis not present

## 2018-05-20 DIAGNOSIS — M65341 Trigger finger, right ring finger: Secondary | ICD-10-CM | POA: Diagnosis not present

## 2018-05-20 DIAGNOSIS — R200 Anesthesia of skin: Secondary | ICD-10-CM | POA: Diagnosis not present

## 2018-05-22 ENCOUNTER — Other Ambulatory Visit: Payer: Self-pay | Admitting: Orthopedic Surgery

## 2018-05-22 DIAGNOSIS — M5416 Radiculopathy, lumbar region: Secondary | ICD-10-CM

## 2018-05-22 DIAGNOSIS — M542 Cervicalgia: Secondary | ICD-10-CM

## 2018-06-19 ENCOUNTER — Ambulatory Visit
Admission: RE | Admit: 2018-06-19 | Discharge: 2018-06-19 | Disposition: A | Discharge status: Home / Self Care | Payer: Medicare HMO | Source: Ambulatory Visit | Attending: Orthopedic Surgery | Admitting: Orthopedic Surgery

## 2018-06-19 DIAGNOSIS — M5416 Radiculopathy, lumbar region: Secondary | ICD-10-CM

## 2018-06-19 DIAGNOSIS — M545 Low back pain: Secondary | ICD-10-CM | POA: Diagnosis not present

## 2018-06-19 DIAGNOSIS — M542 Cervicalgia: Secondary | ICD-10-CM | POA: Diagnosis not present

## 2018-06-19 LAB — CUP PACEART REMOTE DEVICE CHECK
Battery Remaining Longevity: 88 mo
Battery Remaining Percentage: 95.5 %
Battery Voltage: 2.99 V
Brady Statistic AP VP Percent: 97 %
Brady Statistic AP VS Percent: 1 %
Brady Statistic AS VP Percent: 1.3 %
Brady Statistic AS VS Percent: 1 %
Brady Statistic RA Percent Paced: 98 %
Brady Statistic RV Percent Paced: 98 %
Date Time Interrogation Session: 20190725060015
Implantable Lead Implant Date: 20100422
Implantable Lead Implant Date: 20100422
Implantable Lead Location: 753859
Implantable Lead Location: 753860
Implantable Pulse Generator Implant Date: 20170801
Lead Channel Impedance Value: 390 Ohm
Lead Channel Impedance Value: 400 Ohm
Lead Channel Pacing Threshold Amplitude: 0.75 V
Lead Channel Pacing Threshold Amplitude: 1 V
Lead Channel Pacing Threshold Pulse Width: 0.5 ms
Lead Channel Pacing Threshold Pulse Width: 0.5 ms
Lead Channel Sensing Intrinsic Amplitude: 12 mV
Lead Channel Sensing Intrinsic Amplitude: 3.9 mV
Lead Channel Setting Pacing Amplitude: 2 V
Lead Channel Setting Pacing Amplitude: 2.5 V
Lead Channel Setting Pacing Pulse Width: 0.5 ms
Lead Channel Setting Sensing Sensitivity: 2 mV
Pulse Gen Model: 2272
Pulse Gen Serial Number: 7929552

## 2018-06-19 MED ORDER — IOPAMIDOL (ISOVUE-M 300) INJECTION 61%
10.0000 mL | Freq: Once | INTRAMUSCULAR | Status: AC | PRN
  Administered 2018-06-19: 10 mL via INTRATHECAL

## 2018-06-19 MED ORDER — ONDANSETRON HCL 4 MG/2ML IJ SOLN
4.0000 mg | Freq: Once | INTRAMUSCULAR | Status: AC
  Administered 2018-06-19: 4 mg via INTRAMUSCULAR

## 2018-06-19 MED ORDER — DIAZEPAM 5 MG PO TABS
5.0000 mg | ORAL_TABLET | Freq: Once | ORAL | Status: AC
  Administered 2018-06-19: 5 mg via ORAL

## 2018-06-19 MED ORDER — MEPERIDINE HCL 50 MG/ML IJ SOLN
50.0000 mg | Freq: Once | INTRAMUSCULAR | Status: AC
  Administered 2018-06-19: 50 mg via INTRAMUSCULAR

## 2018-06-19 NOTE — Discharge Instructions (Signed)

## 2018-06-26 DIAGNOSIS — E119 Type 2 diabetes mellitus without complications: Secondary | ICD-10-CM | POA: Diagnosis not present

## 2018-06-26 DIAGNOSIS — H25813 Combined forms of age-related cataract, bilateral: Secondary | ICD-10-CM | POA: Diagnosis not present

## 2018-06-26 DIAGNOSIS — H4043X3 Glaucoma secondary to eye inflammation, bilateral, severe stage: Secondary | ICD-10-CM | POA: Diagnosis not present

## 2018-07-08 DIAGNOSIS — M545 Low back pain: Secondary | ICD-10-CM | POA: Diagnosis not present

## 2018-07-08 DIAGNOSIS — M542 Cervicalgia: Secondary | ICD-10-CM | POA: Diagnosis not present

## 2018-07-10 DIAGNOSIS — M62838 Other muscle spasm: Secondary | ICD-10-CM | POA: Diagnosis not present

## 2018-07-10 DIAGNOSIS — M47896 Other spondylosis, lumbar region: Secondary | ICD-10-CM | POA: Diagnosis not present

## 2018-07-10 DIAGNOSIS — M47892 Other spondylosis, cervical region: Secondary | ICD-10-CM | POA: Diagnosis not present

## 2018-07-12 DIAGNOSIS — M2242 Chondromalacia patellae, left knee: Secondary | ICD-10-CM | POA: Diagnosis not present

## 2018-07-12 DIAGNOSIS — M25662 Stiffness of left knee, not elsewhere classified: Secondary | ICD-10-CM | POA: Diagnosis not present

## 2018-07-15 DIAGNOSIS — M47892 Other spondylosis, cervical region: Secondary | ICD-10-CM | POA: Diagnosis not present

## 2018-07-15 DIAGNOSIS — M47896 Other spondylosis, lumbar region: Secondary | ICD-10-CM | POA: Diagnosis not present

## 2018-07-15 DIAGNOSIS — M62838 Other muscle spasm: Secondary | ICD-10-CM | POA: Diagnosis not present

## 2018-07-15 DIAGNOSIS — M5416 Radiculopathy, lumbar region: Secondary | ICD-10-CM | POA: Diagnosis not present

## 2018-07-17 DIAGNOSIS — M62838 Other muscle spasm: Secondary | ICD-10-CM | POA: Diagnosis not present

## 2018-07-17 DIAGNOSIS — M47892 Other spondylosis, cervical region: Secondary | ICD-10-CM | POA: Diagnosis not present

## 2018-07-17 DIAGNOSIS — M47896 Other spondylosis, lumbar region: Secondary | ICD-10-CM | POA: Diagnosis not present

## 2018-07-22 DIAGNOSIS — M47896 Other spondylosis, lumbar region: Secondary | ICD-10-CM | POA: Diagnosis not present

## 2018-07-22 DIAGNOSIS — M47892 Other spondylosis, cervical region: Secondary | ICD-10-CM | POA: Diagnosis not present

## 2018-07-22 DIAGNOSIS — M62838 Other muscle spasm: Secondary | ICD-10-CM | POA: Diagnosis not present

## 2018-07-23 DIAGNOSIS — M5416 Radiculopathy, lumbar region: Secondary | ICD-10-CM | POA: Diagnosis not present

## 2018-07-30 DIAGNOSIS — M47896 Other spondylosis, lumbar region: Secondary | ICD-10-CM | POA: Diagnosis not present

## 2018-07-30 DIAGNOSIS — M62838 Other muscle spasm: Secondary | ICD-10-CM | POA: Diagnosis not present

## 2018-07-30 DIAGNOSIS — M47892 Other spondylosis, cervical region: Secondary | ICD-10-CM | POA: Diagnosis not present

## 2018-08-01 DIAGNOSIS — M62838 Other muscle spasm: Secondary | ICD-10-CM | POA: Diagnosis not present

## 2018-08-01 DIAGNOSIS — M47892 Other spondylosis, cervical region: Secondary | ICD-10-CM | POA: Diagnosis not present

## 2018-08-01 DIAGNOSIS — M47896 Other spondylosis, lumbar region: Secondary | ICD-10-CM | POA: Diagnosis not present

## 2018-08-07 DIAGNOSIS — H4043X3 Glaucoma secondary to eye inflammation, bilateral, severe stage: Secondary | ICD-10-CM | POA: Diagnosis not present

## 2018-08-07 DIAGNOSIS — H25813 Combined forms of age-related cataract, bilateral: Secondary | ICD-10-CM | POA: Diagnosis not present

## 2018-08-07 DIAGNOSIS — E119 Type 2 diabetes mellitus without complications: Secondary | ICD-10-CM | POA: Diagnosis not present

## 2018-08-08 ENCOUNTER — Ambulatory Visit (INDEPENDENT_AMBULATORY_CARE_PROVIDER_SITE_OTHER): Payer: Medicare HMO | Admitting: *Deleted

## 2018-08-08 DIAGNOSIS — I495 Sick sinus syndrome: Secondary | ICD-10-CM

## 2018-08-08 DIAGNOSIS — M47892 Other spondylosis, cervical region: Secondary | ICD-10-CM | POA: Diagnosis not present

## 2018-08-08 DIAGNOSIS — M62838 Other muscle spasm: Secondary | ICD-10-CM | POA: Diagnosis not present

## 2018-08-08 DIAGNOSIS — M47896 Other spondylosis, lumbar region: Secondary | ICD-10-CM | POA: Diagnosis not present

## 2018-08-08 NOTE — Progress Notes (Signed)
Remote pacemaker transmission.   

## 2018-08-12 DIAGNOSIS — R69 Illness, unspecified: Secondary | ICD-10-CM | POA: Diagnosis not present

## 2018-08-30 DIAGNOSIS — E1100 Type 2 diabetes mellitus with hyperosmolarity without nonketotic hyperglycemic-hyperosmolar coma (NKHHC): Secondary | ICD-10-CM | POA: Diagnosis not present

## 2018-08-30 DIAGNOSIS — M130 Polyarthritis, unspecified: Secondary | ICD-10-CM | POA: Diagnosis not present

## 2018-08-30 DIAGNOSIS — Z6835 Body mass index (BMI) 35.0-35.9, adult: Secondary | ICD-10-CM | POA: Diagnosis not present

## 2018-08-30 DIAGNOSIS — Z0000 Encounter for general adult medical examination without abnormal findings: Secondary | ICD-10-CM | POA: Diagnosis not present

## 2018-08-30 LAB — CUP PACEART REMOTE DEVICE CHECK
Battery Remaining Longevity: 87 mo
Battery Remaining Percentage: 95.5 %
Battery Voltage: 2.98 V
Brady Statistic AP VP Percent: 97 %
Brady Statistic AP VS Percent: 1 %
Brady Statistic AS VP Percent: 1.6 %
Brady Statistic AS VS Percent: 1 %
Brady Statistic RA Percent Paced: 97 %
Brady Statistic RV Percent Paced: 98 %
Date Time Interrogation Session: 20191024060036
Implantable Lead Implant Date: 20100422
Implantable Lead Implant Date: 20100422
Implantable Lead Location: 753859
Implantable Lead Location: 753860
Implantable Pulse Generator Implant Date: 20170801
Lead Channel Impedance Value: 390 Ohm
Lead Channel Impedance Value: 390 Ohm
Lead Channel Pacing Threshold Amplitude: 0.75 V
Lead Channel Pacing Threshold Amplitude: 1 V
Lead Channel Pacing Threshold Pulse Width: 0.5 ms
Lead Channel Pacing Threshold Pulse Width: 0.5 ms
Lead Channel Sensing Intrinsic Amplitude: 10.5 mV
Lead Channel Sensing Intrinsic Amplitude: 3.1 mV
Lead Channel Setting Pacing Amplitude: 2 V
Lead Channel Setting Pacing Amplitude: 2.5 V
Lead Channel Setting Pacing Pulse Width: 0.5 ms
Lead Channel Setting Sensing Sensitivity: 2 mV
Pulse Gen Model: 2272
Pulse Gen Serial Number: 7929552

## 2018-09-04 DIAGNOSIS — M5416 Radiculopathy, lumbar region: Secondary | ICD-10-CM | POA: Diagnosis not present

## 2018-09-26 DIAGNOSIS — M5416 Radiculopathy, lumbar region: Secondary | ICD-10-CM | POA: Diagnosis not present

## 2018-10-01 DIAGNOSIS — H25813 Combined forms of age-related cataract, bilateral: Secondary | ICD-10-CM | POA: Diagnosis not present

## 2018-10-01 DIAGNOSIS — E119 Type 2 diabetes mellitus without complications: Secondary | ICD-10-CM | POA: Diagnosis not present

## 2018-10-01 DIAGNOSIS — H4043X3 Glaucoma secondary to eye inflammation, bilateral, severe stage: Secondary | ICD-10-CM | POA: Diagnosis not present

## 2018-10-31 DIAGNOSIS — E1140 Type 2 diabetes mellitus with diabetic neuropathy, unspecified: Secondary | ICD-10-CM | POA: Diagnosis not present

## 2018-10-31 DIAGNOSIS — M109 Gout, unspecified: Secondary | ICD-10-CM | POA: Diagnosis not present

## 2018-10-31 DIAGNOSIS — G8929 Other chronic pain: Secondary | ICD-10-CM | POA: Diagnosis not present

## 2018-10-31 DIAGNOSIS — B0223 Postherpetic polyneuropathy: Secondary | ICD-10-CM | POA: Diagnosis not present

## 2018-10-31 DIAGNOSIS — I10 Essential (primary) hypertension: Secondary | ICD-10-CM | POA: Diagnosis not present

## 2018-10-31 DIAGNOSIS — M545 Low back pain: Secondary | ICD-10-CM | POA: Diagnosis not present

## 2018-10-31 DIAGNOSIS — E669 Obesity, unspecified: Secondary | ICD-10-CM | POA: Diagnosis not present

## 2018-10-31 DIAGNOSIS — H409 Unspecified glaucoma: Secondary | ICD-10-CM | POA: Diagnosis not present

## 2018-10-31 DIAGNOSIS — M1990 Unspecified osteoarthritis, unspecified site: Secondary | ICD-10-CM | POA: Diagnosis not present

## 2018-10-31 DIAGNOSIS — G4730 Sleep apnea, unspecified: Secondary | ICD-10-CM | POA: Diagnosis not present

## 2018-11-07 ENCOUNTER — Ambulatory Visit (INDEPENDENT_AMBULATORY_CARE_PROVIDER_SITE_OTHER): Payer: Medicare HMO

## 2018-11-07 DIAGNOSIS — I495 Sick sinus syndrome: Secondary | ICD-10-CM | POA: Diagnosis not present

## 2018-11-08 ENCOUNTER — Encounter: Payer: Self-pay | Admitting: Cardiology

## 2018-11-08 NOTE — Progress Notes (Signed)
Remote pacemaker transmission.   

## 2018-11-10 LAB — CUP PACEART REMOTE DEVICE CHECK
Battery Remaining Longevity: 87 mo
Battery Remaining Percentage: 95.5 %
Battery Voltage: 2.98 V
Brady Statistic AP VP Percent: 97 %
Brady Statistic AP VS Percent: 1 %
Brady Statistic AS VP Percent: 1.6 %
Brady Statistic AS VS Percent: 1 %
Brady Statistic RA Percent Paced: 97 %
Brady Statistic RV Percent Paced: 98 %
Date Time Interrogation Session: 20200123070014
Implantable Lead Implant Date: 20100422
Implantable Lead Implant Date: 20100422
Implantable Lead Location: 753859
Implantable Lead Location: 753860
Implantable Pulse Generator Implant Date: 20170801
Lead Channel Impedance Value: 390 Ohm
Lead Channel Impedance Value: 390 Ohm
Lead Channel Pacing Threshold Amplitude: 0.75 V
Lead Channel Pacing Threshold Amplitude: 1 V
Lead Channel Pacing Threshold Pulse Width: 0.5 ms
Lead Channel Pacing Threshold Pulse Width: 0.5 ms
Lead Channel Sensing Intrinsic Amplitude: 10.6 mV
Lead Channel Sensing Intrinsic Amplitude: 3.1 mV
Lead Channel Setting Pacing Amplitude: 2 V
Lead Channel Setting Pacing Amplitude: 2.5 V
Lead Channel Setting Pacing Pulse Width: 0.5 ms
Lead Channel Setting Sensing Sensitivity: 2 mV
Pulse Gen Model: 2272
Pulse Gen Serial Number: 7929552

## 2018-11-22 DIAGNOSIS — M542 Cervicalgia: Secondary | ICD-10-CM | POA: Diagnosis not present

## 2018-11-22 DIAGNOSIS — M545 Low back pain: Secondary | ICD-10-CM | POA: Diagnosis not present

## 2018-11-28 DIAGNOSIS — M5412 Radiculopathy, cervical region: Secondary | ICD-10-CM | POA: Diagnosis not present

## 2018-11-28 DIAGNOSIS — M5416 Radiculopathy, lumbar region: Secondary | ICD-10-CM | POA: Diagnosis not present

## 2018-11-28 DIAGNOSIS — M48061 Spinal stenosis, lumbar region without neurogenic claudication: Secondary | ICD-10-CM | POA: Diagnosis not present

## 2018-12-02 DIAGNOSIS — E1100 Type 2 diabetes mellitus with hyperosmolarity without nonketotic hyperglycemic-hyperosmolar coma (NKHHC): Secondary | ICD-10-CM | POA: Diagnosis not present

## 2018-12-02 DIAGNOSIS — E0800 Diabetes mellitus due to underlying condition with hyperosmolarity without nonketotic hyperglycemic-hyperosmolar coma (NKHHC): Secondary | ICD-10-CM | POA: Diagnosis not present

## 2018-12-02 DIAGNOSIS — Z6835 Body mass index (BMI) 35.0-35.9, adult: Secondary | ICD-10-CM | POA: Diagnosis not present

## 2018-12-02 DIAGNOSIS — M130 Polyarthritis, unspecified: Secondary | ICD-10-CM | POA: Diagnosis not present

## 2018-12-05 DIAGNOSIS — Z79891 Long term (current) use of opiate analgesic: Secondary | ICD-10-CM | POA: Diagnosis not present

## 2018-12-05 DIAGNOSIS — G894 Chronic pain syndrome: Secondary | ICD-10-CM | POA: Diagnosis not present

## 2018-12-06 DIAGNOSIS — M48061 Spinal stenosis, lumbar region without neurogenic claudication: Secondary | ICD-10-CM | POA: Diagnosis not present

## 2018-12-06 DIAGNOSIS — M5416 Radiculopathy, lumbar region: Secondary | ICD-10-CM | POA: Diagnosis not present

## 2018-12-06 DIAGNOSIS — M5412 Radiculopathy, cervical region: Secondary | ICD-10-CM | POA: Diagnosis not present

## 2018-12-09 DIAGNOSIS — M5416 Radiculopathy, lumbar region: Secondary | ICD-10-CM | POA: Diagnosis not present

## 2018-12-09 DIAGNOSIS — M5412 Radiculopathy, cervical region: Secondary | ICD-10-CM | POA: Diagnosis not present

## 2018-12-09 DIAGNOSIS — M48061 Spinal stenosis, lumbar region without neurogenic claudication: Secondary | ICD-10-CM | POA: Diagnosis not present

## 2018-12-16 DIAGNOSIS — M48061 Spinal stenosis, lumbar region without neurogenic claudication: Secondary | ICD-10-CM | POA: Diagnosis not present

## 2018-12-16 DIAGNOSIS — M5412 Radiculopathy, cervical region: Secondary | ICD-10-CM | POA: Diagnosis not present

## 2018-12-16 DIAGNOSIS — M5416 Radiculopathy, lumbar region: Secondary | ICD-10-CM | POA: Diagnosis not present

## 2018-12-25 DIAGNOSIS — M48061 Spinal stenosis, lumbar region without neurogenic claudication: Secondary | ICD-10-CM | POA: Diagnosis not present

## 2018-12-25 DIAGNOSIS — S134XXD Sprain of ligaments of cervical spine, subsequent encounter: Secondary | ICD-10-CM | POA: Diagnosis not present

## 2018-12-25 DIAGNOSIS — S335XXD Sprain of ligaments of lumbar spine, subsequent encounter: Secondary | ICD-10-CM | POA: Diagnosis not present

## 2018-12-25 DIAGNOSIS — M5416 Radiculopathy, lumbar region: Secondary | ICD-10-CM | POA: Diagnosis not present

## 2018-12-25 DIAGNOSIS — M5412 Radiculopathy, cervical region: Secondary | ICD-10-CM | POA: Diagnosis not present

## 2019-01-28 ENCOUNTER — Telehealth: Payer: Self-pay

## 2019-01-28 NOTE — Telephone Encounter (Signed)
Called pt in regards to appt on 01/31/19. Unable to leave VM due to VM being full.

## 2019-01-28 NOTE — Telephone Encounter (Signed)
Spoke with pt and pt son regarding appt on 01/31/19. Pt stated she is not able to upload EKG, but will check vitals day of appt. Pt questions and concerns were address.

## 2019-01-31 ENCOUNTER — Telehealth (INDEPENDENT_AMBULATORY_CARE_PROVIDER_SITE_OTHER): Payer: Medicare HMO | Admitting: Internal Medicine

## 2019-01-31 VITALS — BP 132/54 | HR 70 | Wt 179.0 lb

## 2019-01-31 DIAGNOSIS — I495 Sick sinus syndrome: Secondary | ICD-10-CM

## 2019-01-31 DIAGNOSIS — I471 Supraventricular tachycardia: Secondary | ICD-10-CM | POA: Diagnosis not present

## 2019-01-31 DIAGNOSIS — G4733 Obstructive sleep apnea (adult) (pediatric): Secondary | ICD-10-CM | POA: Diagnosis not present

## 2019-01-31 DIAGNOSIS — I10 Essential (primary) hypertension: Secondary | ICD-10-CM

## 2019-01-31 NOTE — Progress Notes (Signed)
Electrophysiology TeleHealth Note   Due to national recommendations of social distancing due to COVID 19, an audio/video telehealth visit is felt to be most appropriate for this patient at this time.  See MyChart message from today for the patient's consent to telehealth for [facility].   Date:  01/31/2019   ID:  [name], DOB [birth year], MRN [id number]  Location: patient's home  Provider location: [address] [place]  Evaluation Performed: Follow-up visit  PCP:  Lucianne Lei, MD  Electrophysiologist:  Dr Rayann Heman  Chief Complaint:  edema  History of Present Illness:    [name] is a 85 y.o. female who presents via audio/video conferencing for a telehealth visit today.  Her primary concern is with DJD.  She exercising at home. Since last being seen in our clinic, the patient reports doing very well.  Today, she denies symptoms of palpitations, chest pain, shortness of breath, dizziness, presyncope, or syncope.  The patient is otherwise without complaint today. Her edema is stable.  Her husband has dementia and is not very active. The patient denies symptoms of fevers, chills, cough, or new SOB worrisome for COVID 19.  Past Medical History:  Diagnosis Date   Benign paroxysmal positional vertigo 04/06/2010   BRADYCARDIA 01/27/2009   s/p PPM   CAROTID BRUIT 02/24/2008   DIABETES MELLITUS, TYPE II 02/24/2008   HYPERTENSION 02/24/2008    Past Surgical History:  Procedure Laterality Date   ABDOMINAL HYSTERECTOMY     1980's   BACK SURGERY     x 3   EP IMPLANTABLE DEVICE N/A 05/16/2016   Procedure: PPM Generator Changeout;  Surgeon: Thompson Grayer, MD;  Location: [facility];  Service: Cardiovascular;  Laterality: N/A;   KNEE SURGERY Right    2003   KNEE SURGERY Left    2011   PACEMAKER INSERTION      Current Outpatient Medications  Medication Sig Dispense Refill   allopurinol (ZYLOPRIM) 100 MG tablet Take 100 mg by mouth  daily.       amLODipine (NORVASC) 10 MG tablet Take 10 mg by mouth daily.     Coenzyme Q10 (CO Q 10 PO) Take by mouth daily.     dorzolamide (TRUSOPT) 2 % ophthalmic solution Place 1 drop into the left eye 3 (three) times daily.  1   glimepiride (AMARYL) 1 MG tablet Take 0.5 mg by mouth 2 (two) times daily.      hydrochlorothiazide (MICROZIDE) 12.5 MG capsule Take 1 capsule (12.5 mg total) by mouth daily. 90 capsule 3   irbesartan (AVAPRO) 150 MG tablet Take 150 mg by mouth daily.  3   latanoprost (XALATAN) 0.005 % ophthalmic solution Place 1 drop into both eyes at bedtime.     LYRICA 50 MG capsule Take 1 capsule by mouth 2 (two) times daily.     meclizine (ANTIVERT) 25 MG tablet Take 25 mg by mouth daily as needed for dizziness.     Multiple Vitamin (MULTIVITAMIN WITH MINERALS) TABS tablet Take 1 tablet by mouth daily.     prednisoLONE acetate (PRED FORTE) 1 % ophthalmic suspension Place 2 drops into the right eye 2 (two) times daily.     valACYclovir (VALTREX) 500 MG tablet      vitamin C (ASCORBIC ACID) 250 MG tablet Take 500 mg by mouth daily.     No current facility-administered medications for this visit.     Allergies:   Aspirin; Penicillins; Brimonidine; Diltiazem hcl; Hydrocodone-acetaminophen;  Morphine; and Neurontin [gabapentin]   Social History:  The patient  reports that she has never smoked. She has never used smokeless tobacco. She reports that she does not drink alcohol or use drugs.   Family History:  The patient's  family history includes Arthritis in her father; Bone cancer in her father; Breast cancer in her sister; Colon cancer in her sister; Congestive Heart Failure in her mother; Dementia in her sister; Hypertension in her sister; Lymphoma in her mother; Tuberculosis in her mother.   ROS:  Please see the history of present illness.   All other systems are personally reviewed and negative.    Exam:    Vital Signs:  BP (!) 132/54    Pulse 70    Wt 179 lb  (81.2 kg)    BMI 29.79 kg/m   Well appearing, alert and conversant, regular work of breathing,  good skin color Eyes- anicteric, neuro- grossly intact, skin- no apparent rash or lesions or cyanosis, mouth- oral mucosa is pink   Labs/Other Tests and Data Reviewed:    Recent Labs: No results found for requested labs within last 8760 hours.   Wt Readings from Last 3 Encounters:  01/31/19 179 lb (81.2 kg)  01/16/18 191 lb (86.6 kg)  02/09/17 194 lb (88 kg)     Other studies personally reviewed: Additional studies/ records that were reviewed today include: my prior office notes  Review of the above records today demonstrates: as above    Last device remote is reviewed from [place] PDF dated 11/07/2018 which reveals normal device function, no arrhythmias    ASSESSMENT & PLAN:    1.  Sick sinus syndrome/ second degree AV block Remote transmissions are uptodate and are reviewed Normal device function  2. HTN Stable No change required today  3. OSA Compliant with CPAP  4. atach No episodes by device interrogation  5. COVID 19 screen The patient denies symptoms of COVID 19 at this time.  The importance of social distancing was discussed today.  Follow-up:  12 months with Renee Next remote: 01/2019  Current medicines are reviewed at length with the patient today.   The patient does not have concerns regarding her medicines.  The following changes were made today:  none  Labs/ tests ordered today include:  No orders of the defined types were placed in this encounter.    Patient Risk:  after full review of this patients clinical status, I feel that they are at moderate risk at this time.  Today, I have spent 15 minutes with the patient with telehealth technology discussing HTN and DJD .    Army Fossa, MD  01/31/2019 2:31 PM     [address] [place] [address] [phone] (office) [phone] (fax)

## 2019-02-03 ENCOUNTER — Encounter: Payer: Medicare HMO | Admitting: Internal Medicine

## 2019-02-06 ENCOUNTER — Ambulatory Visit (INDEPENDENT_AMBULATORY_CARE_PROVIDER_SITE_OTHER): Payer: Medicare HMO | Admitting: *Deleted

## 2019-02-06 ENCOUNTER — Other Ambulatory Visit: Payer: Self-pay

## 2019-02-06 DIAGNOSIS — I495 Sick sinus syndrome: Secondary | ICD-10-CM | POA: Diagnosis not present

## 2019-02-06 LAB — CUP PACEART REMOTE DEVICE CHECK
Battery Remaining Longevity: 84 mo
Battery Remaining Percentage: 95 %
Battery Voltage: 2.98 V
Brady Statistic RA Percent Paced: 97 %
Brady Statistic RV Percent Paced: 98 %
Date Time Interrogation Session: 20200423160717
Implantable Lead Implant Date: 20100422
Implantable Lead Implant Date: 20100422
Implantable Lead Location: 753859
Implantable Lead Location: 753860
Implantable Pulse Generator Implant Date: 20170801
Lead Channel Impedance Value: 350 Ohm
Lead Channel Impedance Value: 380 Ohm
Lead Channel Sensing Intrinsic Amplitude: 2.5 mV
Lead Channel Sensing Intrinsic Amplitude: 8.7 mV
Lead Channel Setting Pacing Amplitude: 2 V
Lead Channel Setting Pacing Amplitude: 2.5 V
Lead Channel Setting Pacing Pulse Width: 0.5 ms
Lead Channel Setting Sensing Sensitivity: 2 mV
Pulse Gen Model: 2272
Pulse Gen Serial Number: 7929552

## 2019-02-14 ENCOUNTER — Encounter: Payer: Self-pay | Admitting: Cardiology

## 2019-02-14 NOTE — Progress Notes (Signed)
Remote pacemaker transmission.   

## 2019-02-20 DIAGNOSIS — E1100 Type 2 diabetes mellitus with hyperosmolarity without nonketotic hyperglycemic-hyperosmolar coma (NKHHC): Secondary | ICD-10-CM | POA: Diagnosis not present

## 2019-02-20 DIAGNOSIS — M10032 Idiopathic gout, left wrist: Secondary | ICD-10-CM | POA: Diagnosis not present

## 2019-02-20 DIAGNOSIS — R110 Nausea: Secondary | ICD-10-CM | POA: Diagnosis not present

## 2019-02-20 DIAGNOSIS — E0800 Diabetes mellitus due to underlying condition with hyperosmolarity without nonketotic hyperglycemic-hyperosmolar coma (NKHHC): Secondary | ICD-10-CM | POA: Diagnosis not present

## 2019-02-24 DIAGNOSIS — K210 Gastro-esophageal reflux disease with esophagitis: Secondary | ICD-10-CM | POA: Diagnosis not present

## 2019-02-25 ENCOUNTER — Inpatient Hospital Stay (HOSPITAL_COMMUNITY)
Admission: EM | Admit: 2019-02-25 | Discharge: 2019-03-11 | DRG: 823 | Disposition: A | Discharge status: Home Health | Payer: Medicare HMO | Attending: Family Medicine | Admitting: Family Medicine

## 2019-02-25 ENCOUNTER — Other Ambulatory Visit: Payer: Self-pay

## 2019-02-25 ENCOUNTER — Encounter (HOSPITAL_COMMUNITY): Payer: Self-pay

## 2019-02-25 ENCOUNTER — Observation Stay (HOSPITAL_COMMUNITY): Payer: Medicare HMO

## 2019-02-25 DIAGNOSIS — N186 End stage renal disease: Secondary | ICD-10-CM | POA: Diagnosis not present

## 2019-02-25 DIAGNOSIS — K5900 Constipation, unspecified: Secondary | ICD-10-CM | POA: Diagnosis present

## 2019-02-25 DIAGNOSIS — I495 Sick sinus syndrome: Secondary | ICD-10-CM | POA: Diagnosis present

## 2019-02-25 DIAGNOSIS — J810 Acute pulmonary edema: Secondary | ICD-10-CM | POA: Diagnosis not present

## 2019-02-25 DIAGNOSIS — Z7984 Long term (current) use of oral hypoglycemic drugs: Secondary | ICD-10-CM

## 2019-02-25 DIAGNOSIS — J309 Allergic rhinitis, unspecified: Secondary | ICD-10-CM | POA: Diagnosis present

## 2019-02-25 DIAGNOSIS — I361 Nonrheumatic tricuspid (valve) insufficiency: Secondary | ICD-10-CM | POA: Diagnosis not present

## 2019-02-25 DIAGNOSIS — E872 Acidosis: Secondary | ICD-10-CM | POA: Diagnosis not present

## 2019-02-25 DIAGNOSIS — R05 Cough: Secondary | ICD-10-CM | POA: Diagnosis present

## 2019-02-25 DIAGNOSIS — R0600 Dyspnea, unspecified: Secondary | ICD-10-CM

## 2019-02-25 DIAGNOSIS — C9000 Multiple myeloma not having achieved remission: Principal | ICD-10-CM | POA: Diagnosis present

## 2019-02-25 DIAGNOSIS — Z950 Presence of cardiac pacemaker: Secondary | ICD-10-CM | POA: Diagnosis not present

## 2019-02-25 DIAGNOSIS — D630 Anemia in neoplastic disease: Secondary | ICD-10-CM | POA: Diagnosis present

## 2019-02-25 DIAGNOSIS — E11649 Type 2 diabetes mellitus with hypoglycemia without coma: Secondary | ICD-10-CM | POA: Diagnosis not present

## 2019-02-25 DIAGNOSIS — D696 Thrombocytopenia, unspecified: Secondary | ICD-10-CM | POA: Diagnosis present

## 2019-02-25 DIAGNOSIS — E871 Hypo-osmolality and hyponatremia: Secondary | ICD-10-CM | POA: Diagnosis present

## 2019-02-25 DIAGNOSIS — E875 Hyperkalemia: Secondary | ICD-10-CM | POA: Diagnosis present

## 2019-02-25 DIAGNOSIS — Z01810 Encounter for preprocedural cardiovascular examination: Secondary | ICD-10-CM | POA: Diagnosis not present

## 2019-02-25 DIAGNOSIS — K5909 Other constipation: Secondary | ICD-10-CM

## 2019-02-25 DIAGNOSIS — N183 Chronic kidney disease, stage 3 (moderate): Secondary | ICD-10-CM | POA: Diagnosis not present

## 2019-02-25 DIAGNOSIS — E1169 Type 2 diabetes mellitus with other specified complication: Secondary | ICD-10-CM | POA: Diagnosis not present

## 2019-02-25 DIAGNOSIS — Z880 Allergy status to penicillin: Secondary | ICD-10-CM

## 2019-02-25 DIAGNOSIS — Z8249 Family history of ischemic heart disease and other diseases of the circulatory system: Secondary | ICD-10-CM

## 2019-02-25 DIAGNOSIS — N2581 Secondary hyperparathyroidism of renal origin: Secondary | ICD-10-CM | POA: Diagnosis not present

## 2019-02-25 DIAGNOSIS — Z885 Allergy status to narcotic agent status: Secondary | ICD-10-CM

## 2019-02-25 DIAGNOSIS — H409 Unspecified glaucoma: Secondary | ICD-10-CM | POA: Diagnosis present

## 2019-02-25 DIAGNOSIS — E1165 Type 2 diabetes mellitus with hyperglycemia: Secondary | ICD-10-CM | POA: Diagnosis present

## 2019-02-25 DIAGNOSIS — Z888 Allergy status to other drugs, medicaments and biological substances status: Secondary | ICD-10-CM

## 2019-02-25 DIAGNOSIS — D649 Anemia, unspecified: Secondary | ICD-10-CM | POA: Insufficient documentation

## 2019-02-25 DIAGNOSIS — G8929 Other chronic pain: Secondary | ICD-10-CM | POA: Diagnosis present

## 2019-02-25 DIAGNOSIS — M109 Gout, unspecified: Secondary | ICD-10-CM | POA: Diagnosis present

## 2019-02-25 DIAGNOSIS — I129 Hypertensive chronic kidney disease with stage 1 through stage 4 chronic kidney disease, or unspecified chronic kidney disease: Secondary | ICD-10-CM | POA: Diagnosis not present

## 2019-02-25 DIAGNOSIS — M898X9 Other specified disorders of bone, unspecified site: Secondary | ICD-10-CM | POA: Diagnosis not present

## 2019-02-25 DIAGNOSIS — R079 Chest pain, unspecified: Secondary | ICD-10-CM | POA: Diagnosis not present

## 2019-02-25 DIAGNOSIS — Z886 Allergy status to analgesic agent status: Secondary | ICD-10-CM

## 2019-02-25 DIAGNOSIS — Z20828 Contact with and (suspected) exposure to other viral communicable diseases: Secondary | ICD-10-CM | POA: Diagnosis present

## 2019-02-25 DIAGNOSIS — E1140 Type 2 diabetes mellitus with diabetic neuropathy, unspecified: Secondary | ICD-10-CM | POA: Diagnosis present

## 2019-02-25 DIAGNOSIS — E883 Tumor lysis syndrome: Secondary | ICD-10-CM | POA: Diagnosis not present

## 2019-02-25 DIAGNOSIS — R3915 Urgency of urination: Secondary | ICD-10-CM | POA: Diagnosis present

## 2019-02-25 DIAGNOSIS — E876 Hypokalemia: Secondary | ICD-10-CM | POA: Diagnosis present

## 2019-02-25 DIAGNOSIS — E860 Dehydration: Secondary | ICD-10-CM | POA: Diagnosis present

## 2019-02-25 DIAGNOSIS — G4733 Obstructive sleep apnea (adult) (pediatric): Secondary | ICD-10-CM | POA: Diagnosis present

## 2019-02-25 DIAGNOSIS — E1122 Type 2 diabetes mellitus with diabetic chronic kidney disease: Secondary | ICD-10-CM | POA: Diagnosis present

## 2019-02-25 DIAGNOSIS — R7989 Other specified abnormal findings of blood chemistry: Secondary | ICD-10-CM | POA: Diagnosis present

## 2019-02-25 DIAGNOSIS — E8809 Other disorders of plasma-protein metabolism, not elsewhere classified: Secondary | ICD-10-CM | POA: Diagnosis present

## 2019-02-25 DIAGNOSIS — E778 Other disorders of glycoprotein metabolism: Secondary | ICD-10-CM | POA: Diagnosis present

## 2019-02-25 DIAGNOSIS — N185 Chronic kidney disease, stage 5: Secondary | ICD-10-CM | POA: Diagnosis not present

## 2019-02-25 DIAGNOSIS — E559 Vitamin D deficiency, unspecified: Secondary | ICD-10-CM

## 2019-02-25 DIAGNOSIS — D72822 Plasmacytosis: Secondary | ICD-10-CM | POA: Diagnosis not present

## 2019-02-25 DIAGNOSIS — Z03818 Encounter for observation for suspected exposure to other biological agents ruled out: Secondary | ICD-10-CM | POA: Diagnosis not present

## 2019-02-25 DIAGNOSIS — Z79891 Long term (current) use of opiate analgesic: Secondary | ICD-10-CM | POA: Diagnosis not present

## 2019-02-25 DIAGNOSIS — D631 Anemia in chronic kidney disease: Secondary | ICD-10-CM | POA: Diagnosis not present

## 2019-02-25 DIAGNOSIS — Z90710 Acquired absence of both cervix and uterus: Secondary | ICD-10-CM

## 2019-02-25 DIAGNOSIS — R059 Cough, unspecified: Secondary | ICD-10-CM

## 2019-02-25 DIAGNOSIS — I340 Nonrheumatic mitral (valve) insufficiency: Secondary | ICD-10-CM | POA: Diagnosis not present

## 2019-02-25 DIAGNOSIS — G894 Chronic pain syndrome: Secondary | ICD-10-CM | POA: Diagnosis not present

## 2019-02-25 DIAGNOSIS — Z992 Dependence on renal dialysis: Secondary | ICD-10-CM

## 2019-02-25 DIAGNOSIS — D509 Iron deficiency anemia, unspecified: Secondary | ICD-10-CM | POA: Diagnosis present

## 2019-02-25 DIAGNOSIS — R918 Other nonspecific abnormal finding of lung field: Secondary | ICD-10-CM | POA: Diagnosis not present

## 2019-02-25 DIAGNOSIS — I120 Hypertensive chronic kidney disease with stage 5 chronic kidney disease or end stage renal disease: Secondary | ICD-10-CM | POA: Diagnosis present

## 2019-02-25 DIAGNOSIS — Z79899 Other long term (current) drug therapy: Secondary | ICD-10-CM

## 2019-02-25 DIAGNOSIS — I10 Essential (primary) hypertension: Secondary | ICD-10-CM | POA: Diagnosis not present

## 2019-02-25 DIAGNOSIS — N179 Acute kidney failure, unspecified: Secondary | ICD-10-CM | POA: Diagnosis present

## 2019-02-25 DIAGNOSIS — Z807 Family history of other malignant neoplasms of lymphoid, hematopoietic and related tissues: Secondary | ICD-10-CM

## 2019-02-25 DIAGNOSIS — Z6830 Body mass index (BMI) 30.0-30.9, adult: Secondary | ICD-10-CM

## 2019-02-25 DIAGNOSIS — I2510 Atherosclerotic heart disease of native coronary artery without angina pectoris: Secondary | ICD-10-CM | POA: Diagnosis not present

## 2019-02-25 DIAGNOSIS — R34 Anuria and oliguria: Secondary | ICD-10-CM | POA: Diagnosis not present

## 2019-02-25 HISTORY — DX: Benign paroxysmal vertigo, unspecified ear: H81.10

## 2019-02-25 HISTORY — DX: Sick sinus syndrome: I49.5

## 2019-02-25 HISTORY — DX: Unspecified glaucoma: H40.9

## 2019-02-25 HISTORY — DX: Obstructive sleep apnea (adult) (pediatric): G47.33

## 2019-02-25 LAB — CBC
HCT: 25.7 % — ABNORMAL LOW (ref 36.0–46.0)
Hemoglobin: 8.3 g/dL — ABNORMAL LOW (ref 12.0–15.0)
MCH: 29.2 pg (ref 26.0–34.0)
MCHC: 32.3 g/dL (ref 30.0–36.0)
MCV: 90.5 fL (ref 80.0–100.0)
Platelets: 191 10*3/uL (ref 150–400)
RBC: 2.84 MIL/uL — ABNORMAL LOW (ref 3.87–5.11)
RDW: 14.1 % (ref 11.5–15.5)
WBC: 5.3 10*3/uL (ref 4.0–10.5)
nRBC: 0 % (ref 0.0–0.2)

## 2019-02-25 LAB — COMPREHENSIVE METABOLIC PANEL
ALT: 12 U/L (ref 0–44)
AST: 18 U/L (ref 15–41)
Albumin: 3 g/dL — ABNORMAL LOW (ref 3.5–5.0)
Alkaline Phosphatase: 30 U/L — ABNORMAL LOW (ref 38–126)
Anion gap: 13 (ref 5–15)
BUN: 50 mg/dL — ABNORMAL HIGH (ref 8–23)
CO2: 23 mmol/L (ref 22–32)
Calcium: 10.8 mg/dL — ABNORMAL HIGH (ref 8.9–10.3)
Chloride: 101 mmol/L (ref 98–111)
Creatinine, Ser: 4.46 mg/dL — ABNORMAL HIGH (ref 0.44–1.00)
GFR calc Af Amer: 10 mL/min — ABNORMAL LOW (ref >60)
GFR calc non Af Amer: 8 mL/min — ABNORMAL LOW (ref >60)
Glucose, Bld: 127 mg/dL — ABNORMAL HIGH (ref 70–99)
Potassium: 3.1 mmol/L — ABNORMAL LOW (ref 3.5–5.1)
Sodium: 137 mmol/L (ref 135–145)
Total Bilirubin: 0.8 mg/dL (ref 0.3–1.2)
Total Protein: 10.7 g/dL — ABNORMAL HIGH (ref 6.5–8.1)

## 2019-02-25 LAB — CBC WITH DIFFERENTIAL/PLATELET
Abs Immature Granulocytes: 0.02 10*3/uL (ref 0.00–0.07)
Basophils Absolute: 0 10*3/uL (ref 0.0–0.1)
Basophils Relative: 1 %
Eosinophils Absolute: 0.2 10*3/uL (ref 0.0–0.5)
Eosinophils Relative: 5 %
HCT: 26.4 % — ABNORMAL LOW (ref 36.0–46.0)
Hemoglobin: 8.6 g/dL — ABNORMAL LOW (ref 12.0–15.0)
Immature Granulocytes: 1 %
Lymphocytes Relative: 30 %
Lymphs Abs: 1.3 10*3/uL (ref 0.7–4.0)
MCH: 29.7 pg (ref 26.0–34.0)
MCHC: 32.6 g/dL (ref 30.0–36.0)
MCV: 91 fL (ref 80.0–100.0)
Monocytes Absolute: 0.4 10*3/uL (ref 0.1–1.0)
Monocytes Relative: 8 %
Neutro Abs: 2.5 10*3/uL (ref 1.7–7.7)
Neutrophils Relative %: 55 %
Platelets: 181 10*3/uL (ref 150–400)
RBC: 2.9 MIL/uL — ABNORMAL LOW (ref 3.87–5.11)
RDW: 14.1 % (ref 11.5–15.5)
WBC: 4.4 10*3/uL (ref 4.0–10.5)
nRBC: 0 % (ref 0.0–0.2)

## 2019-02-25 LAB — URINALYSIS, ROUTINE W REFLEX MICROSCOPIC
Bilirubin Urine: NEGATIVE
Glucose, UA: NEGATIVE mg/dL
Ketones, ur: NEGATIVE mg/dL
Nitrite: NEGATIVE
Protein, ur: 30 mg/dL — AB
Specific Gravity, Urine: 1.008 (ref 1.005–1.030)
pH: 6 (ref 5.0–8.0)

## 2019-02-25 LAB — SARS CORONAVIRUS 2 BY RT PCR (HOSPITAL ORDER, PERFORMED IN ~~LOC~~ HOSPITAL LAB): SARS Coronavirus 2: NEGATIVE

## 2019-02-25 LAB — GLUCOSE, CAPILLARY
Glucose-Capillary: 82 mg/dL (ref 70–99)
Glucose-Capillary: 77 mg/dL (ref 70–99)

## 2019-02-25 LAB — OCCULT BLOOD X 1 CARD TO LAB, STOOL: Fecal Occult Bld: NEGATIVE

## 2019-02-25 LAB — MAGNESIUM: Magnesium: 2.3 mg/dL (ref 1.7–2.4)

## 2019-02-25 LAB — LIPASE, BLOOD: Lipase: 26 U/L (ref 11–51)

## 2019-02-25 MED ORDER — HEPARIN SODIUM (PORCINE) 5000 UNIT/ML IJ SOLN: 5000.0000 [IU] | Freq: Three times a day (TID) | INTRAMUSCULAR | Status: DC

## 2019-02-25 MED ORDER — SODIUM CHLORIDE 0.9 % IV SOLN
INTRAVENOUS | Status: DC
  Administered 2019-02-25 – 2019-03-07 (×7): via INTRAVENOUS

## 2019-02-25 MED ORDER — POLYETHYLENE GLYCOL 3350 17 G PO PACK
17.0000 g | PACK | Freq: Every day | ORAL | Status: DC | PRN
  Administered 2019-02-26: 17 g via ORAL
  Filled 2019-02-25 (×2): qty 1

## 2019-02-25 MED ORDER — HYDRALAZINE HCL 20 MG/ML IJ SOLN
10.0000 mg | Freq: Four times a day (QID) | INTRAMUSCULAR | Status: DC | PRN
  Filled 2019-02-25: qty 1

## 2019-02-25 MED ORDER — AMLODIPINE BESYLATE 10 MG PO TABS
10.0000 mg | ORAL_TABLET | Freq: Every day | ORAL | Status: DC
  Administered 2019-02-25 – 2019-03-06 (×9): 10 mg via ORAL
  Filled 2019-02-25: qty 1
  Filled 2019-02-25: qty 2
  Filled 2019-02-25 (×3): qty 1
  Filled 2019-02-25 (×2): qty 2
  Filled 2019-02-25 (×3): qty 1
  Filled 2019-02-25: qty 2

## 2019-02-25 MED ORDER — PREGABALIN 50 MG PO CAPS
50.0000 mg | ORAL_CAPSULE | Freq: Every day | ORAL | Status: DC
  Administered 2019-02-25 – 2019-03-10 (×14): 50 mg via ORAL
  Filled 2019-02-25 (×15): qty 1

## 2019-02-25 MED ORDER — INSULIN ASPART 100 UNIT/ML ~~LOC~~ SOLN
0.0000 [IU] | Freq: Three times a day (TID) | SUBCUTANEOUS | Status: DC
  Administered 2019-03-01: 1 [IU] via SUBCUTANEOUS
  Administered 2019-03-01: 3 [IU] via SUBCUTANEOUS
  Administered 2019-03-02 (×3): 1 [IU] via SUBCUTANEOUS
  Administered 2019-03-03: 2 [IU] via SUBCUTANEOUS
  Administered 2019-03-03 – 2019-03-04 (×2): 1 [IU] via SUBCUTANEOUS
  Administered 2019-03-04: 2 [IU] via SUBCUTANEOUS
  Administered 2019-03-04: 1 [IU] via SUBCUTANEOUS

## 2019-02-25 MED ORDER — ACETAMINOPHEN 325 MG PO TABS
650.0000 mg | ORAL_TABLET | Freq: Four times a day (QID) | ORAL | Status: DC | PRN
  Administered 2019-03-04: 650 mg via ORAL
  Filled 2019-02-25 (×2): qty 2

## 2019-02-25 MED ORDER — HEPARIN SODIUM (PORCINE) 5000 UNIT/ML IJ SOLN
5000.0000 [IU] | Freq: Three times a day (TID) | INTRAMUSCULAR | Status: AC
  Administered 2019-02-25 – 2019-03-06 (×28): 5000 [IU] via SUBCUTANEOUS
  Filled 2019-02-25 (×27): qty 1

## 2019-02-25 MED ORDER — LATANOPROST 0.005 % OP SOLN
1.0000 [drp] | Freq: Every day | OPHTHALMIC | Status: DC
  Administered 2019-02-25 – 2019-03-10 (×13): 1 [drp] via OPHTHALMIC
  Filled 2019-02-25 (×2): qty 2.5

## 2019-02-25 MED ORDER — FLUOROMETHOLONE 0.1 % OP SUSP
1.0000 [drp] | Freq: Three times a day (TID) | OPHTHALMIC | Status: DC
  Administered 2019-02-25 – 2019-03-11 (×42): 1 [drp] via OPHTHALMIC
  Filled 2019-02-25: qty 5

## 2019-02-25 MED ORDER — DORZOLAMIDE HCL 2 % OP SOLN
1.0000 [drp] | Freq: Every evening | OPHTHALMIC | Status: DC
  Administered 2019-02-25 – 2019-03-10 (×14): 1 [drp] via OPHTHALMIC
  Filled 2019-02-25: qty 10

## 2019-02-25 MED ORDER — TRAMADOL HCL 50 MG PO TABS: 50.0000 mg | ORAL_TABLET | Freq: Two times a day (BID) | ORAL | Status: DC | PRN

## 2019-02-25 MED ORDER — ALLOPURINOL 100 MG PO TABS
100.0000 mg | ORAL_TABLET | Freq: Every day | ORAL | Status: DC
  Administered 2019-02-25 – 2019-03-09 (×12): 100 mg via ORAL
  Filled 2019-02-25 (×13): qty 1

## 2019-02-25 NOTE — ED Triage Notes (Signed)
Patient reports that she was called by her physician today and was told she had abnormal kidney function labs. Patient was told ot come to the ED

## 2019-02-25 NOTE — ED Notes (Signed)
ED TO INPATIENT HANDOFF REPORT  ED Nurse Name and Phone #: [phone] Yetta Marceaux  S Name/Age/Gender [name] 85 y.o. female Room/Bed: WA24/WA24  Code Status   Code Status: Full Code  Home/SNF/Other Home Patient oriented to: self Is this baseline? Yes   Triage Complete: Triage complete  Chief Complaint abnormla labs   Triage Note Patient reports that she was called by her physician today and was told she had abnormal kidney function labs. Patient was told ot come to the ED   Allergies Allergies  Allergen Reactions  . Aspirin Other (See Comments)    REACTION: STOMACH ISSUES WITH DOSE HIGHER THAN 81 MG   . Penicillins Rash    Patient took injection and tablets, had a reaction. She has taken amoxicillin with no reaction Has patient had a PCN reaction causing immediate rash, facial/tongue/throat swelling, SOB or lightheadedness with hypotension: Yes Has patient had a PCN reaction causing severe rash involving mucus membranes or skin necrosis: Yes Has patient had a PCN reaction that required hospitalization No Has patient had a PCN reaction occurring within the last 10 years: No If all of the above answers are "NO", then m  . Brimonidine Itching  . Diltiazem Hcl Rash  . Hydrocodone-Acetaminophen Nausea And Vomiting  . Morphine Nausea And Vomiting  . Neurontin [Gabapentin] Nausea And Vomiting    Level of Care/Admitting Diagnosis ED Disposition    ED Disposition Condition Comment   Admit  Hospital Area: [facility] [[id number]]  Level of Care: Med-Surg [16]  Covid Evaluation: N/A  Diagnosis: AKI (acute kidney injury) [facility]) [[id number]]  Admitting Physician: Georgette Shell [[id number]]  Attending Physician: Georgette Shell [[id number]]  PT Class (Do Not Modify): Observation [104]  PT Acc Code (Do Not Modify): Observation [10022]       B Medical/Surgery History Past Medical History:  Diagnosis Date  . Benign paroxysmal positional vertigo 04/06/2010   . 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 CAROTID BRUIT 02/24/2008  . DIABETES MELLITUS, TYPE II 02/24/2008  . HYPERTENSION 02/24/2008   Past Surgical History:  Procedure Laterality Date  . ABDOMINAL HYSTERECTOMY     1980's  . BACK SURGERY     x 3  . EP IMPLANTABLE DEVICE N/A 05/16/2016   Procedure: PPM Generator Changeout;  Surgeon: Thompson Grayer, MD;  Location: [place] [facility];  Service: Cardiovascular;  Laterality: N/A;  . KNEE SURGERY Right    2003  . KNEE SURGERY Left    2011  . PACEMAKER INSERTION       A IV Location/Drains/Wounds Patient Lines/Drains/Airways Status   Active Line/Drains/Airways    Name:   Placement date:   Placement time:   Site:   Days:   Peripheral IV 05/16/16 Left Antecubital   05/16/16    1127    Antecubital   1015          Intake/Output Last 24 hours No intake or output data in the 24 hours ending 02/25/19 1431  Labs/Imaging Results for orders placed or performed during the hospital encounter of 02/25/19 (from the past 48 hour(s))  CBC with Differential/Platelet     Status: Abnormal   Collection Time: 02/25/19 11:03 AM  Result Value Ref Range   WBC 4.4 4.0 - 10.5 K/uL   RBC 2.90 (L) 3.87 - 5.11 MIL/uL   Hemoglobin 8.6 (L) 12.0 - 15.0 g/dL   HCT 26.4 (L) 36.0 - 46.0 %   MCV 91.0 80.0 - 100.0 fL   MCH 29.7  26.0 - 34.0 pg   MCHC 32.6 30.0 - 36.0 g/dL   RDW 14.1 11.5 - 15.5 %   Platelets 181 150 - 400 K/uL   nRBC 0.0 0.0 - 0.2 %   Neutrophils Relative % 55 %   Neutro Abs 2.5 1.7 - 7.7 K/uL   Lymphocytes Relative 30 %   Lymphs Abs 1.3 0.7 - 4.0 K/uL   Monocytes Relative 8 %   Monocytes Absolute 0.4 0.1 - 1.0 K/uL   Eosinophils Relative 5 %   Eosinophils Absolute 0.2 0.0 - 0.5 K/uL   Basophils Relative 1 %   Basophils Absolute 0.0 0.0 - 0.1 K/uL   Immature Granulocytes 1 %   Abs Immature Granulocytes 0.02 0.00 - 0.07 K/uL    Comment: Performed at [facility], [place] [address], [place], [place] [address]  Comprehensive  metabolic panel     Status: Abnormal   Collection Time: 02/25/19 11:03 AM  Result Value Ref Range   Sodium 137 135 - 145 mmol/L   Potassium 3.1 (L) 3.5 - 5.1 mmol/L   Chloride 101 98 - 111 mmol/L   CO2 23 22 - 32 mmol/L   Glucose, Bld 127 (H) 70 - 99 mg/dL   BUN 50 (H) 8 - 23 mg/dL   Creatinine, Ser 4.46 (H) 0.44 - 1.00 mg/dL   Calcium 10.8 (H) 8.9 - 10.3 mg/dL   Total Protein 10.7 (H) 6.5 - 8.1 g/dL   Albumin 3.0 (L) 3.5 - 5.0 g/dL   AST 18 15 - 41 U/L   ALT 12 0 - 44 U/L   Alkaline Phosphatase 30 (L) 38 - 126 U/L   Total Bilirubin 0.8 0.3 - 1.2 mg/dL   GFR calc non Af Amer 8 (L) >60 mL/min   GFR calc Af Amer 10 (L) >60 mL/min   Anion gap 13 5 - 15    Comment: Performed at [facility], [place] [address], [place], [place] [address]  Lipase, blood     Status: None   Collection Time: 02/25/19 11:03 AM  Result Value Ref Range   Lipase 26 11 - 51 U/L    Comment: Performed at [facility], [place] [address], [place], [place] [address]  Magnesium     Status: None   Collection Time: 02/25/19 11:03 AM  Result Value Ref Range   Magnesium 2.3 1.7 - 2.4 mg/dL    Comment: Performed at [facility], [place] [address], [place], [place] [address]  Urinalysis, Routine w reflex microscopic     Status: Abnormal   Collection Time: 02/25/19 11:03 AM  Result Value Ref Range   Color, Urine STRAW (A) YELLOW   APPearance CLEAR CLEAR   Specific Gravity, Urine 1.008 1.005 - 1.030   pH 6.0 5.0 - 8.0   Glucose, UA NEGATIVE NEGATIVE mg/dL   Hgb urine dipstick SMALL (A) NEGATIVE   Bilirubin Urine NEGATIVE NEGATIVE   Ketones, ur NEGATIVE NEGATIVE mg/dL   Protein, ur 30 (A) NEGATIVE mg/dL   Nitrite NEGATIVE NEGATIVE   Leukocytes,Ua TRACE (A) NEGATIVE   RBC / HPF 0-5 0 - 5 RBC/hpf   WBC, UA 6-10 0 - 5 WBC/hpf   Bacteria, UA RARE (A) NONE SEEN   Squamous Epithelial / LPF 0-5 0 - 5    Comment: Performed at [facility], [place] [address], [place], [place] [address]  Occult blood card to lab, stool     Status: None   Collection  Time: 02/25/19 11:58 AM  Result Value Ref Range   Fecal Occult Bld NEGATIVE NEGATIVE    Comment: Performed at [facility], [place] [address] [place], [place] [address]  SARS Coronavirus 2 (CEPHEID - Performed in [place] hospital lab), Hosp Order     Status: None   Collection Time: 02/25/19 11:58 AM  Result Value Ref Range   SARS Coronavirus 2 NEGATIVE NEGATIVE    Comment: (NOTE) If result is NEGATIVE SARS-CoV-2 target nucleic acids are NOT DETECTED. The SARS-CoV-2 RNA is generally detectable in upper and lower  respiratory specimens during the acute phase of infection. The lowest  concentration of SARS-CoV-2 viral copies this assay can detect is 250  copies / mL. A negative result does not preclude SARS-CoV-2 infection  and should not be used as the sole basis for treatment or other  patient management decisions.  A negative result may occur with  improper specimen collection / handling, submission of specimen other  than nasopharyngeal swab, presence of viral mutation(s) within the  areas targeted by this assay, and inadequate number of viral copies  (<250 copies / mL). A negative result must be combined with clinical  observations, patient history, and epidemiological information. If result is POSITIVE SARS-CoV-2 target nucleic acids are DETECTED. The SARS-CoV-2 RNA is generally detectable in upper and lower  respiratory specimens dur ing the acute phase of infection.  Positive  results are indicative of active infection with SARS-CoV-2.  Clinical  correlation with patient history and other diagnostic information is  necessary to determine patient infection status.  Positive results do  not rule out bacterial infection or co-infection with other viruses. If result is PRESUMPTIVE POSTIVE SARS-CoV-2 nucleic acids MAY BE PRESENT.   A presumptive positive result was  obtained on the submitted specimen  and confirmed on repeat testing.  While 2019 novel coronavirus  (SARS-CoV-2) nucleic acids may be present in the submitted sample  additional confirmatory testing may be necessary for epidemiological  and / or clinical management purposes  to differentiate between  SARS-CoV-2 and other Sarbecovirus currently known to infect humans.  If clinically indicated additional testing with an alternate test  methodology [phone]) is advised. The SARS-CoV-2 RNA is generally  detectable in upper and lower respiratory sp ecimens during the acute  phase of infection. The expected result is Negative. Fact Sheet for Patients:  StrictlyIdeas.no Fact Sheet for Healthcare Providers: BankingDealers.co.za This test is not yet approved or cleared by the [place] FDA and has been authorized for detection and/or diagnosis of SARS-CoV-2 by FDA under an Emergency Use Authorization (EUA).  This EUA will remain in effect (meaning this test can be used) for the duration of the COVID-19 declaration under Section 564(b)(1) of the Act, 21 U.S.C. section 360bbb-3(b)(1), unless the authorization is terminated or revoked sooner. Performed at [facility], [place] [address] [place], [place] [address]    No results found.  Pending Labs Unresulted Labs (From admission, onward)    Start     Ordered   02/26/19 0500  Comprehensive metabolic panel  Tomorrow morning,   R     02/25/19 1300   02/26/19 0500  CBC  Tomorrow morning,   R     02/25/19 1300   02/26/19 0500  Hemoglobin A1c  Tomorrow morning,   R     02/25/19 1317   02/26/19 0500  Lipid panel  Tomorrow morning,   R     02/25/19 1317   02/25/19 1300  CBC  (heparin)  Once,   R    Comments:  Baseline for heparin therapy IF NOT ALREADY DRAWN.  Notify MD if PLT < 100 K.    02/25/19 1300          Vitals/Pain Today's Vitals   02/25/19 1300 02/25/19 1330  02/25/19 1400 02/25/19 1430  BP: (!) 158/67 (!) 145/65 (!) 157/67 (!) 159/65  Pulse: 71 73 70 73  Resp: 13 15 15 18   Temp:      TempSrc:      SpO2: 98% 98% 99% 98%  Weight:      Height:      PainSc:        Isolation Precautions No active isolations  Medications Medications  0.9 %  sodium chloride infusion (has no administration in time range)  hydrALAZINE (APRESOLINE) injection 10 mg (has no administration in time range)  allopurinol (ZYLOPRIM) tablet 100 mg (has no administration in time range)  amLODipine (NORVASC) tablet 10 mg (has no administration in time range)  dorzolamide (TRUSOPT) 2 % ophthalmic solution 1 drop (has no administration in time range)  fluorometholone (FML) 0.1 % ophthalmic suspension 1 drop (has no administration in time range)  latanoprost (XALATAN) 0.005 % ophthalmic solution 1 drop (has no administration in time range)  pregabalin (LYRICA) capsule 50 mg (has no administration in time range)  traMADol (ULTRAM) tablet 50-100 mg (has no administration in time range)  insulin aspart (novoLOG) injection 0-9 Units (has no administration in time range)  heparin injection 5,000 Units (has no administration in time range)    Mobility manual wheelchair Low fall risk   Focused Assessments         R Recommendations: See Admitting Provider Note  Report given to:   Additional Notes:

## 2019-02-25 NOTE — H&P (Addendum)
History and Physical    [name] ESP:[id number] DOB: March 30, 1930 DOA: 02/25/2019  PCP: Lucianne Lei, MD Patient coming from home Chief Complaint: Sent from primary care doctor's office with abnormal labs  HPI: [name] is a 85 y.o. female with medical history significant of type 2 diabetes, obstructive sleep apnea, hypertension, sick sinus syndrome came into the ER after the primary care physician told her to come to the ER for abnormal kidney labs.  Patient reported that she noticed a decrease in the urine output for the last few days.  Her appetite is poor her p.o. intake is poor and has been having nausea but no vomiting no diarrhea in fact she is very constipated.  No fever she has been having a cough productive of yellow phlegm occasionally feels short of breath and has chronic pleuritic chest pain.  She denies frequency of urination or dysuria.  Patient reports taking Tylenol on a regular basis for arthritic pain and occasional Motrin and is trying to get used to tramadol she has not taken a full tramadol yet due to nausea.  Tramadol also causes her to be nauseous.  She is never known to have a history of kidney disease.  She denies hematuria hematochezia hematemesis or any bleeding issues.  Denies any sick contacts or patients known to have COVID or recent travel she pretty much stayed home during the quarantine process.  ED Course: Her sodium is 137 potassium 3.1 BUN is 50 creatinine 4.46 magnesium 2.3 alkaline phosphatase is 30 LFTs otherwise normal white count 4.4 hemoglobin 8.6 platelet count 181 COVID test is pending.  UA is straw-colored with small amount of blood and trace leukocytes and 30 of protein and negative ketones.  6-10 WBCs and rare bacteria.  Her vital signs were 158/67 pulse was 71 respiration 20 pulse ox 98% room air she is afebrile.  Review of Systems: As per HPI otherwise all other systems reviewed and are negative  Ambulatory Status she is ambulatory at  baseline Past Medical History:  Diagnosis Date  . Benign paroxysmal positional vertigo 04/06/2010  . 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 CAROTID BRUIT 02/24/2008  . DIABETES MELLITUS, TYPE II 02/24/2008  . HYPERTENSION 02/24/2008    Past Surgical History:  Procedure Laterality Date  . ABDOMINAL HYSTERECTOMY     1980's  . BACK SURGERY     x 3  . EP IMPLANTABLE DEVICE N/A 05/16/2016   Procedure: PPM Generator Changeout;  Surgeon: Thompson Grayer, MD;  Location: [place] [facility];  Service: Cardiovascular;  Laterality: N/A;  . KNEE SURGERY Right    2003  . KNEE SURGERY Left    2011  . PACEMAKER INSERTION      Social History   Socioeconomic History  . Marital status: Married    Spouse name: Not on file  . Number of children: Not on file  . Years of education: Not on file  . Highest education level: Not on file  Occupational History  . Occupation: retired  Scientific laboratory technician  . Financial resource strain: Not on file  . Food insecurity:    Worry: Not on file    Inability: Not on file  . Transportation needs:    Medical: Not on file    Non-medical: Not on file  Tobacco Use  . Smoking status: Never Smoker  . Smokeless tobacco: Never Used  Substance and Sexual Activity  . Alcohol use: No  . Drug use: No  . Sexual activity: Not on  file  Lifestyle  . Physical activity:    Days per week: Not on file    Minutes per session: Not on file  . Stress: Not on file  Relationships  . Social connections:    Talks on phone: Not on file    Gets together: Not on file    Attends religious service: Not on file    Active member of club or organization: Not on file    Attends meetings of clubs or organizations: Not on file    Relationship status: Not on file  . Intimate partner violence:    Fear of current or ex partner: Not on file    Emotionally abused: Not on file    Physically abused: Not on file    Forced sexual activity: Not on file  Other Topics Concern  . Not on file  Social  History Narrative  . Not on file    Allergies  Allergen Reactions  . Aspirin Other (See Comments)    REACTION: STOMACH ISSUES WITH DOSE HIGHER THAN 81 MG   . Penicillins Rash    Patient took injection and tablets, had a reaction. She has taken amoxicillin with no reaction Has patient had a PCN reaction causing immediate rash, facial/tongue/throat swelling, SOB or lightheadedness with hypotension: Yes Has patient had a PCN reaction causing severe rash involving mucus membranes or skin necrosis: Yes Has patient had a PCN reaction that required hospitalization No Has patient had a PCN reaction occurring within the last 10 years: No If all of the above answers are "NO", then m  . Brimonidine Itching  . Diltiazem Hcl Rash  . Hydrocodone-Acetaminophen Nausea And Vomiting  . Morphine Nausea And Vomiting  . Neurontin [Gabapentin] Nausea And Vomiting    Family History  Problem Relation Age of Onset  . Congestive Heart Failure Mother   . Tuberculosis Mother   . Lymphoma Mother   . Bone cancer Father   . Arthritis Father   . Breast cancer Sister   . Colon cancer Sister   . Hypertension Sister   . Dementia Sister     Prior to Admission medications   Medication Sig Start Date End Date Taking? Authorizing Provider  allopurinol (ZYLOPRIM) 100 MG tablet Take 100 mg by mouth daily.     Yes [provider]  amLODipine (NORVASC) 10 MG tablet Take 10 mg by mouth daily.   Yes [provider]  Coenzyme Q10 (CO Q 10 PO) Take 1 tablet by mouth daily.    Yes [provider]  dorzolamide (TRUSOPT) 2 % ophthalmic solution Place 1 drop into the left eye every evening.  12/21/17  Yes [provider]  fluorometholone (FML) 0.1 % ophthalmic suspension Place 1 drop into the right eye 3 (three) times daily.  02/01/19  Yes [provider]  glimepiride (AMARYL) 1 MG tablet Take 0.5 mg by mouth 2 (two) times daily.    Yes [provider]  LUMIGAN 0.01 % SOLN  Place 1 drop into the left eye every evening.  02/01/19  Yes [provider]  LYRICA 50 MG capsule Take 50 mg by mouth daily.  06/14/15  Yes [provider]  Multiple Vitamin (MULTIVITAMIN WITH MINERALS) TABS tablet Take 1 tablet by mouth daily.   Yes [provider]  NARCAN 4 MG/0.1ML LIQD nasal spray kit Place 1 Dose into the nose once as needed. overdose 12/07/18  Yes [provider]  pantoprazole (PROTONIX) 40 MG tablet Take 40 mg by mouth  daily. 02/20/19  Yes [provider]  polyethylene glycol (MIRALAX / GLYCOLAX) 17 g packet Take 5.6 g by mouth at bedtime.   Yes [provider]  sodium chloride (OCEAN) 0.65 % SOLN nasal spray Place 1 spray into both nostrils as needed for congestion.   Yes [provider]  sucralfate (CARAFATE) 1 GM/10ML suspension Take 10 mLs by mouth 3 times/day as needed-between meals & bedtime. Acid reflux 02/21/19  Yes [provider]  telmisartan (MICARDIS) 80 MG tablet Take 80 mg by mouth daily.   Yes [provider]  traMADol (ULTRAM) 50 MG tablet Take 50-100 mg by mouth every 12 (twelve) hours as needed for pain. 11/25/18  Yes [provider]  hydrochlorothiazide (MICROZIDE) 12.5 MG capsule Take 1 capsule (12.5 mg total) by mouth daily. Patient not taking: Reported on 02/25/2019 03/10/15   Nita Sells, MD    Physical Exam: Vitals:   02/25/19 1100 02/25/19 1130 02/25/19 1200 02/25/19 1230  BP: (!) 161/59 (!) 151/60 (!) 159/79 (!) 165/67  Pulse: 76 70 89 70  Resp: 12 13 (!) 21 13  Temp:      TempSrc:      SpO2: 99% 99% 100% 100%  Weight:      Height:         . General:  Appears calm and comfortable . Eyes:  PERRL, EOMI, normal lids, iris . XHF:SFSELTR normal hearing, lips & tongue, oral mucosa dry . Neck: no LAD, masses or thyromegaly . Cardiovascular:  RRR, no m/r/g. No LE edema.  Marland Kitchen Respiratory: Few rhonchi bilaterally, no w/r/r. Normal respiratory effort. .  Abdomen:  soft, ntnd, NABS . Skin:  no rash or induration seen on limited exam . Musculoskeletal:  grossly normal tone BUE/BLE, good ROM, no bony abnormality . Psychiatric:  grossly normal mood and affect, speech fluent and appropriate, AOx3 . Neurologic:  CN 2-12 grossly intact, moves all extremities in coordinated fashion, sensation intact  Labs on Admission: I have personally reviewed following labs and imaging studies  CBC: Recent Labs  Lab 02/25/19 1103  WBC 4.4  NEUTROABS 2.5  HGB 8.6*  HCT 26.4*  MCV 91.0  PLT 320   Basic Metabolic Panel: Recent Labs  Lab 02/25/19 1103  NA 137  K 3.1*  CL 101  CO2 23  GLUCOSE 127*  BUN 50*  CREATININE 4.46*  CALCIUM 10.8*  MG 2.3   GFR: Estimated Creatinine Clearance: 9.5 mL/min (A) (by C-G formula based on SCr of 4.46 mg/dL (H)). Liver Function Tests: Recent Labs  Lab 02/25/19 1103  AST 18  ALT 12  ALKPHOS 30*  BILITOT 0.8  PROT 10.7*  ALBUMIN 3.0*   Recent Labs  Lab 02/25/19 1103  LIPASE 26   No results for input(s): AMMONIA in the last 168 hours. Coagulation Profile: No results for input(s): INR, PROTIME in the last 168 hours. Cardiac Enzymes: No results for input(s): CKTOTAL, CKMB, CKMBINDEX, TROPONINI in the last 168 hours. BNP (last 3 results) No results for input(s): PROBNP in the last 8760 hours. HbA1C: No results for input(s): HGBA1C in the last 72 hours. CBG: No results for input(s): GLUCAP in the last 168 hours. Lipid Profile: No results for input(s): CHOL, HDL, LDLCALC, TRIG, CHOLHDL, LDLDIRECT in the last 72 hours. Thyroid Function Tests: No results for input(s): TSH, T4TOTAL, FREET4, T3FREE, THYROIDAB in the last 72 hours. Anemia Panel: No results for input(s): VITAMINB12, FOLATE, FERRITIN, TIBC, IRON, RETICCTPCT in the last 72 hours. Urine analysis:    Component  Value Date/Time   COLORURINE STRAW (A) 02/25/2019 1103   APPEARANCEUR CLEAR 02/25/2019 1103   LABSPEC 1.008 02/25/2019 1103    PHURINE 6.0 02/25/2019 1103   GLUCOSEU NEGATIVE 02/25/2019 1103   HGBUR SMALL (A) 02/25/2019 1103   BILIRUBINUR NEGATIVE 02/25/2019 1103   KETONESUR NEGATIVE 02/25/2019 1103   PROTEINUR 30 (A) 02/25/2019 1103   UROBILINOGEN 1.0 10/05/2012 1953   NITRITE NEGATIVE 02/25/2019 1103   LEUKOCYTESUR TRACE (A) 02/25/2019 1103    Creatinine Clearance: Estimated Creatinine Clearance: 9.5 mL/min (A) (by C-G formula based on SCr of 4.46 mg/dL (H)).  Sepsis Labs: _0 (procalcitonin:4,lacticidven:4) )No results found for this or any previous visit (from the past 240 hour(s)).   Radiological Exams on Admission: No results found.   Assessment/Plan Active Problems:   AKI (acute kidney injury) ([place])    #1 AKI creatinine on admission 4.46 which is up from her previous creatinine of 1.0 in 2017 this is most likely secondary to decreased p.o. intake with some dehydration, patient also takes hydrochlorothiazide and Micardis at home which probably have contributed.  I will start IV fluids and recheck labs tomorrow check renal ultrasound.  Her potassium is 3.1 which I will not replete.  She is probably developing progressive renal disease secondary to longstanding diabetes and hypertension.  If renal functions does not improve further work-up is needed.   #2 hypertension patient reports her blood pressure is pretty well controlled at home on Norvasc 10 mg daily, Micardis 80 mg daily, hydrochlorothiazide 12.5 mg daily.  Denies Norvasc add PRN hydralazine  #3 type 2 diabetes complicated with neuropathy she takes Amaryl 0.5 mg twice a day check hemoglobin A1c hold Amaryl start SSI  #4 gout continue allopurinol at 100 mg daily  #5 glaucoma continue eyedrops  #6 neuropathy secondary to diabetes continue Lyrica 50 mg daily.  #7 anemia of chronic disease her last hemoglobin I have is from 2017 and it is 11.4.  She was FOBT negative.  #8 cough patient complains of a productive cough with yellow  phlegm and chronic pleuritic chest pain COVID is pending we will obtain chest x-ray.  Estimated body mass index is 29.79 kg/m as calculated from the following:   Height as of this encounter: _1  (1.651 m).   Weight as of this encounter: 81.2 kg.   DVT prophylaxis subcu heparin Code Status: Full code Family Communication: Called son [phone] left voicemail Disposition Plan: Pending clinical improvement Consults called: None Admission status: Observation   Georgette Shell MD [facility]  If 7PM-7AM, please contact night-coverage [email or web address] Password [email or web address]  02/25/2019, 1:01 PM

## 2019-02-25 NOTE — ED Provider Notes (Signed)
[facility] DEPT Provider Note   CSN: [id number] Arrival date & time: 02/25/19  1004    History   Chief Complaint Chief Complaint  Patient presents with  . abnormal labs    HPI [name] is a 85 y.o. female.     HPI Patient states she was seen by her primary physician on Friday for nausea and upset stomach.  She was placed on medication and the symptoms have improved.  Had some blood work drawn at that time.  She was told that her kidney function was abnormal that she needed to be evaluated.  Patient also planes of generalized fatigue though currently denying any abdominal pain, nausea, vomiting.  Patient has had some constipation.  Denies fever chills.  No chest pain or shortness of breath. Past Medical History:  Diagnosis Date  . Benign paroxysmal positional vertigo 04/06/2010  . 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 CAROTID BRUIT 02/24/2008  . DIABETES MELLITUS, TYPE II 02/24/2008  . HYPERTENSION 02/24/2008    Patient Active Problem List   Diagnosis Date Noted  . Chest pain 03/09/2015  . Shingles 03/09/2015  . Sick sinus syndrome ([place]) 02/03/2015  . Shortness of breath 02/01/2014  . Fatigue 02/01/2014  . BENIGN PAROXYSMAL POSITIONAL VERTIGO 04/06/2010  . BRADYCARDIA 01/27/2009  . Diabetes mellitus ([place]) 02/24/2008  . Obstructive sleep apnea 02/24/2008  . 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 Essential hypertension 02/24/2008  . ALLERGIC RHINITIS 02/24/2008  . CAROTID BRUIT 02/24/2008    Past Surgical History:  Procedure Laterality Date  . ABDOMINAL HYSTERECTOMY     1980's  . BACK SURGERY     x 3  . EP IMPLANTABLE DEVICE N/A 05/16/2016   Procedure: PPM Generator Changeout;  Surgeon: Thompson Grayer, MD;  Location: [facility];  Service: Cardiovascular;  Laterality: N/A;  . KNEE SURGERY Right    2003  . KNEE SURGERY Left    2011  . PACEMAKER INSERTION       OB History   No obstetric history on file.       Home Medications    Prior to Admission medications   Medication Sig Start Date End Date Taking? Authorizing Provider  allopurinol (ZYLOPRIM) 100 MG tablet Take 100 mg by mouth daily.     Yes [provider]  amLODipine (NORVASC) 10 MG tablet Take 10 mg by mouth daily.   Yes [provider]  Coenzyme Q10 (CO Q 10 PO) Take 1 tablet by mouth daily.    Yes [provider]  dorzolamide (TRUSOPT) 2 % ophthalmic solution Place 1 drop into the left eye every evening.  12/21/17  Yes [provider]  fluorometholone (FML) 0.1 % ophthalmic suspension Place 1 drop into the right eye 3 (three) times daily.  02/01/19  Yes [provider]  glimepiride (AMARYL) 1 MG tablet Take 0.5 mg by mouth 2 (two) times daily.    Yes [provider]  LUMIGAN 0.01 % SOLN Place 1 drop into the left eye every evening.  02/01/19  Yes [provider]  LYRICA 50 MG capsule Take 50 mg by mouth daily.  06/14/15  Yes [provider]  Multiple Vitamin (MULTIVITAMIN WITH MINERALS) TABS tablet Take 1 tablet by mouth daily.   Yes [provider]  NARCAN 4 MG/0.1ML LIQD nasal spray kit Place 1 Dose into the nose once as needed. overdose 12/07/18  Yes [provider]  pantoprazole (PROTONIX) 40 MG tablet Take 40 mg  by mouth daily. 02/20/19  Yes [provider]  polyethylene glycol (MIRALAX / GLYCOLAX) 17 g packet Take 5.6 g by mouth at bedtime.   Yes [provider]  sodium chloride (OCEAN) 0.65 % SOLN nasal spray Place 1 spray into both nostrils as needed for congestion.   Yes [provider]  sucralfate (CARAFATE) 1 GM/10ML suspension Take 10 mLs by mouth 3 times/day as needed-between meals & bedtime. Acid reflux 02/21/19  Yes [provider]  telmisartan (MICARDIS) 80 MG tablet Take 80 mg by mouth daily.   Yes [provider]  traMADol (ULTRAM) 50 MG tablet Take 50-100 mg by mouth every 12 (twelve) hours as needed  for pain. 11/25/18  Yes [provider]  hydrochlorothiazide (MICROZIDE) 12.5 MG capsule Take 1 capsule (12.5 mg total) by mouth daily. Patient not taking: Reported on 02/25/2019 03/10/15   Nita Sells, MD    Family History Family History  Problem Relation Age of Onset  . Congestive Heart Failure Mother   . Tuberculosis Mother   . Lymphoma Mother   . Bone cancer Father   . Arthritis Father   . Breast cancer Sister   . Colon cancer Sister   . Hypertension Sister   . Dementia Sister     Social History Social History   Tobacco Use  . Smoking status: Never Smoker  . Smokeless tobacco: Never Used  Substance Use Topics  . Alcohol use: No  . Drug use: No     Allergies   Aspirin; Penicillins; Brimonidine; Diltiazem hcl; Hydrocodone-acetaminophen; Morphine; and Neurontin [gabapentin]   Review of Systems Review of Systems  Constitutional: Positive for fatigue. Negative for chills and fever.  Respiratory: Negative for cough and shortness of breath.   Cardiovascular: Negative for chest pain.  Gastrointestinal: Positive for constipation. Negative for abdominal pain, diarrhea, nausea and vomiting.  Genitourinary: Negative for difficulty urinating, dysuria, flank pain, frequency and hematuria.  Musculoskeletal: Negative for back pain, myalgias and neck pain.  Skin: Negative for rash and wound.  Neurological: Positive for weakness. Negative for dizziness, syncope, speech difficulty, light-headedness, numbness and headaches.  All other systems reviewed and are negative.    Physical Exam Updated Vital Signs BP (!) 159/79   Pulse 89   Temp 97.8 F (36.6 C) (Oral)   Resp (!) 21   Ht 5' 5"  (1.651 m)   Wt 81.2 kg   SpO2 100%   BMI 29.79 kg/m   Physical Exam Vitals signs and nursing note reviewed.  Constitutional:      General: She is not in acute distress.    Appearance: Normal appearance. She is well-developed. She is not ill-appearing.  HENT:     Head:  Normocephalic and atraumatic.     Nose: Nose normal.     Mouth/Throat:     Mouth: Mucous membranes are moist.  Eyes:     Extraocular Movements: Extraocular movements intact.     Pupils: Pupils are equal, round, and reactive to light.  Neck:     Musculoskeletal: Normal range of motion and neck supple. No neck rigidity or muscular tenderness.  Cardiovascular:     Rate and Rhythm: Normal rate and regular rhythm.     Heart sounds: No murmur. No friction rub. No gallop.   Pulmonary:     Effort: Pulmonary effort is normal. No respiratory distress.     Breath sounds: Normal breath sounds. No stridor. No wheezing, rhonchi or rales.  Chest:     Chest wall: No tenderness.  Abdominal:     General: Bowel sounds are normal. There is no distension.     Palpations: Abdomen is soft.     Tenderness: There is no abdominal tenderness. There is no right CVA tenderness, left CVA tenderness, guarding or rebound.  Musculoskeletal: Normal range of motion.        General: No swelling, tenderness, deformity or signs of injury.     Right lower leg: No edema.     Left lower leg: No edema.  Lymphadenopathy:     Cervical: No cervical adenopathy.  Skin:    General: Skin is warm and dry.     Findings: No erythema or rash.  Neurological:     General: No focal deficit present.     Mental Status: She is alert and oriented to person, place, and time.     Comments: 5/5 motor in all extremities.  Sensation intact.  Psychiatric:        Behavior: Behavior normal.      ED Treatments / Results  Labs (all labs ordered are listed, but only abnormal results are displayed) Labs Reviewed  CBC WITH DIFFERENTIAL/PLATELET - Abnormal; Notable for the following components:      Result Value   RBC 2.90 (*)    Hemoglobin 8.6 (*)    HCT 26.4 (*)    All other components within normal limits  COMPREHENSIVE METABOLIC PANEL - Abnormal; Notable for the following components:   Potassium 3.1 (*)    Glucose, Bld 127 (*)     BUN 50 (*)    Creatinine, Ser 4.46 (*)    Calcium 10.8 (*)    Total Protein 10.7 (*)    Albumin 3.0 (*)    Alkaline Phosphatase 30 (*)    GFR calc non Af Amer 8 (*)    GFR calc Af Amer 10 (*)    All other components within normal limits  URINALYSIS, ROUTINE W REFLEX MICROSCOPIC - Abnormal; Notable for the following components:   Color, Urine STRAW (*)    Hgb urine dipstick SMALL (*)    Protein, ur 30 (*)    Leukocytes,Ua TRACE (*)    Bacteria, UA RARE (*)    All other components within normal limits  SARS CORONAVIRUS 2 (HOSPITAL ORDER, [facility])  LIPASE, BLOOD  MAGNESIUM  OCCULT BLOOD X 1 CARD TO LAB, STOOL    EKG EKG Interpretation  Date/Time:  Tuesday Feb 25 2019 10:47:02 EDT Ventricular Rate:  74 PR Interval:    QRS Duration: 184 QT Interval:  479 QTC Calculation: 532 R Axis:   -71 Text Interpretation:  Atrial-ventricular dual-paced complexes No further analysis attempted due to paced rhythm Confirmed by Julianne Rice [phone]) on 02/25/2019 11:09:35 AM   Radiology No results found.  Procedures Procedures (including critical care time)  Medications Ordered in ED Medications - No data to display   Initial Impression / Assessment and Plan / ED Course  I have reviewed the triage vital signs and the nursing notes.  Pertinent labs & imaging results that were available during my care of the patient were reviewed by me and considered in my medical decision making (see chart for details).        Patient with creatinine greater than 4.  Appears to have a baseline of 1.  Patient also noted to be anemic.  Hemoccult stool is negative.  Discussed with hospitalist who will see patient in the emergency department and admit.  Final Clinical Impressions(s) / ED Diagnoses  Final diagnoses:  Acute renal failure, unspecified acute renal failure type ([place])  Anemia, unspecified type    ED Discharge Orders    None       Julianne Rice,  MD 02/25/19 1240

## 2019-02-26 DIAGNOSIS — E883 Tumor lysis syndrome: Secondary | ICD-10-CM | POA: Diagnosis not present

## 2019-02-26 DIAGNOSIS — C9000 Multiple myeloma not having achieved remission: Secondary | ICD-10-CM | POA: Diagnosis not present

## 2019-02-26 DIAGNOSIS — G894 Chronic pain syndrome: Secondary | ICD-10-CM | POA: Diagnosis not present

## 2019-02-26 DIAGNOSIS — Z20828 Contact with and (suspected) exposure to other viral communicable diseases: Secondary | ICD-10-CM | POA: Diagnosis not present

## 2019-02-26 DIAGNOSIS — E871 Hypo-osmolality and hyponatremia: Secondary | ICD-10-CM | POA: Diagnosis not present

## 2019-02-26 DIAGNOSIS — D631 Anemia in chronic kidney disease: Secondary | ICD-10-CM | POA: Diagnosis not present

## 2019-02-26 DIAGNOSIS — M109 Gout, unspecified: Secondary | ICD-10-CM | POA: Diagnosis present

## 2019-02-26 DIAGNOSIS — Z01810 Encounter for preprocedural cardiovascular examination: Secondary | ICD-10-CM | POA: Diagnosis not present

## 2019-02-26 DIAGNOSIS — G4733 Obstructive sleep apnea (adult) (pediatric): Secondary | ICD-10-CM | POA: Diagnosis present

## 2019-02-26 DIAGNOSIS — Z4901 Encounter for fitting and adjustment of extracorporeal dialysis catheter: Secondary | ICD-10-CM | POA: Diagnosis not present

## 2019-02-26 DIAGNOSIS — J810 Acute pulmonary edema: Secondary | ICD-10-CM | POA: Diagnosis not present

## 2019-02-26 DIAGNOSIS — E1140 Type 2 diabetes mellitus with diabetic neuropathy, unspecified: Secondary | ICD-10-CM | POA: Diagnosis present

## 2019-02-26 DIAGNOSIS — N183 Chronic kidney disease, stage 3 (moderate): Secondary | ICD-10-CM | POA: Diagnosis not present

## 2019-02-26 DIAGNOSIS — D630 Anemia in neoplastic disease: Secondary | ICD-10-CM | POA: Diagnosis present

## 2019-02-26 DIAGNOSIS — E876 Hypokalemia: Secondary | ICD-10-CM | POA: Diagnosis not present

## 2019-02-26 DIAGNOSIS — N186 End stage renal disease: Secondary | ICD-10-CM | POA: Diagnosis not present

## 2019-02-26 DIAGNOSIS — E11649 Type 2 diabetes mellitus with hypoglycemia without coma: Secondary | ICD-10-CM | POA: Diagnosis not present

## 2019-02-26 DIAGNOSIS — N179 Acute kidney failure, unspecified: Secondary | ICD-10-CM | POA: Diagnosis not present

## 2019-02-26 DIAGNOSIS — I361 Nonrheumatic tricuspid (valve) insufficiency: Secondary | ICD-10-CM | POA: Diagnosis not present

## 2019-02-26 DIAGNOSIS — I340 Nonrheumatic mitral (valve) insufficiency: Secondary | ICD-10-CM | POA: Diagnosis not present

## 2019-02-26 DIAGNOSIS — M898X9 Other specified disorders of bone, unspecified site: Secondary | ICD-10-CM | POA: Diagnosis not present

## 2019-02-26 DIAGNOSIS — N185 Chronic kidney disease, stage 5: Secondary | ICD-10-CM | POA: Diagnosis not present

## 2019-02-26 DIAGNOSIS — I10 Essential (primary) hypertension: Secondary | ICD-10-CM | POA: Diagnosis not present

## 2019-02-26 DIAGNOSIS — I495 Sick sinus syndrome: Secondary | ICD-10-CM | POA: Diagnosis present

## 2019-02-26 DIAGNOSIS — D696 Thrombocytopenia, unspecified: Secondary | ICD-10-CM | POA: Diagnosis present

## 2019-02-26 DIAGNOSIS — E1122 Type 2 diabetes mellitus with diabetic chronic kidney disease: Secondary | ICD-10-CM | POA: Diagnosis not present

## 2019-02-26 DIAGNOSIS — D72822 Plasmacytosis: Secondary | ICD-10-CM | POA: Diagnosis not present

## 2019-02-26 DIAGNOSIS — Z79891 Long term (current) use of opiate analgesic: Secondary | ICD-10-CM | POA: Diagnosis not present

## 2019-02-26 DIAGNOSIS — J309 Allergic rhinitis, unspecified: Secondary | ICD-10-CM | POA: Diagnosis present

## 2019-02-26 DIAGNOSIS — R079 Chest pain, unspecified: Secondary | ICD-10-CM | POA: Diagnosis not present

## 2019-02-26 DIAGNOSIS — E1169 Type 2 diabetes mellitus with other specified complication: Secondary | ICD-10-CM | POA: Diagnosis not present

## 2019-02-26 DIAGNOSIS — R918 Other nonspecific abnormal finding of lung field: Secondary | ICD-10-CM | POA: Diagnosis not present

## 2019-02-26 DIAGNOSIS — I120 Hypertensive chronic kidney disease with stage 5 chronic kidney disease or end stage renal disease: Secondary | ICD-10-CM | POA: Diagnosis not present

## 2019-02-26 DIAGNOSIS — E872 Acidosis: Secondary | ICD-10-CM | POA: Diagnosis not present

## 2019-02-26 DIAGNOSIS — K5900 Constipation, unspecified: Secondary | ICD-10-CM | POA: Diagnosis present

## 2019-02-26 DIAGNOSIS — K5909 Other constipation: Secondary | ICD-10-CM | POA: Diagnosis not present

## 2019-02-26 DIAGNOSIS — I129 Hypertensive chronic kidney disease with stage 1 through stage 4 chronic kidney disease, or unspecified chronic kidney disease: Secondary | ICD-10-CM | POA: Diagnosis not present

## 2019-02-26 DIAGNOSIS — R0600 Dyspnea, unspecified: Secondary | ICD-10-CM | POA: Diagnosis not present

## 2019-02-26 DIAGNOSIS — D649 Anemia, unspecified: Secondary | ICD-10-CM | POA: Diagnosis not present

## 2019-02-26 DIAGNOSIS — N2581 Secondary hyperparathyroidism of renal origin: Secondary | ICD-10-CM | POA: Diagnosis not present

## 2019-02-26 DIAGNOSIS — Z950 Presence of cardiac pacemaker: Secondary | ICD-10-CM | POA: Diagnosis not present

## 2019-02-26 DIAGNOSIS — E1165 Type 2 diabetes mellitus with hyperglycemia: Secondary | ICD-10-CM | POA: Diagnosis present

## 2019-02-26 LAB — HEMOGLOBIN A1C
Hgb A1c MFr Bld: 5.5 % (ref 4.8–5.6)
Mean Plasma Glucose: 111.15 mg/dL

## 2019-02-26 LAB — LIPID PANEL
Cholesterol: 108 mg/dL (ref 0–200)
HDL: 20 mg/dL — ABNORMAL LOW (ref >40)
LDL Cholesterol: 59 mg/dL (ref 0–99)
Total CHOL/HDL Ratio: 5.4 RATIO
Triglycerides: 145 mg/dL (ref <150)
VLDL: 29 mg/dL (ref 0–40)

## 2019-02-26 LAB — CBC
HCT: 25.6 % — ABNORMAL LOW (ref 36.0–46.0)
Hemoglobin: 7.8 g/dL — ABNORMAL LOW (ref 12.0–15.0)
MCH: 28.5 pg (ref 26.0–34.0)
MCHC: 30.5 g/dL (ref 30.0–36.0)
MCV: 93.4 fL (ref 80.0–100.0)
Platelets: 184 10*3/uL (ref 150–400)
RBC: 2.74 MIL/uL — ABNORMAL LOW (ref 3.87–5.11)
RDW: 14.4 % (ref 11.5–15.5)
WBC: 4.7 10*3/uL (ref 4.0–10.5)
nRBC: 0 % (ref 0.0–0.2)

## 2019-02-26 LAB — COMPREHENSIVE METABOLIC PANEL
ALT: 13 U/L (ref 0–44)
AST: 18 U/L (ref 15–41)
Albumin: 2.5 g/dL — ABNORMAL LOW (ref 3.5–5.0)
Alkaline Phosphatase: 26 U/L — ABNORMAL LOW (ref 38–126)
Anion gap: 11 (ref 5–15)
BUN: 45 mg/dL — ABNORMAL HIGH (ref 8–23)
CO2: 23 mmol/L (ref 22–32)
Calcium: 10.2 mg/dL (ref 8.9–10.3)
Chloride: 104 mmol/L (ref 98–111)
Creatinine, Ser: 4.21 mg/dL — ABNORMAL HIGH (ref 0.44–1.00)
GFR calc Af Amer: 10 mL/min — ABNORMAL LOW (ref >60)
GFR calc non Af Amer: 9 mL/min — ABNORMAL LOW (ref >60)
Glucose, Bld: 88 mg/dL (ref 70–99)
Potassium: 3.5 mmol/L (ref 3.5–5.1)
Sodium: 138 mmol/L (ref 135–145)
Total Bilirubin: 0.3 mg/dL (ref 0.3–1.2)
Total Protein: 9.4 g/dL — ABNORMAL HIGH (ref 6.5–8.1)

## 2019-02-26 LAB — PROTEIN / CREATININE RATIO, URINE
Creatinine, Urine: 80.41 mg/dL
Protein Creatinine Ratio: 1.88 mg/mg{Cre} — ABNORMAL HIGH (ref 0.00–0.15)
Total Protein, Urine: 151 mg/dL

## 2019-02-26 LAB — GLUCOSE, CAPILLARY
Glucose-Capillary: 72 mg/dL (ref 70–99)
Glucose-Capillary: 86 mg/dL (ref 70–99)
Glucose-Capillary: 98 mg/dL (ref 70–99)
Glucose-Capillary: 70 mg/dL (ref 70–99)

## 2019-02-26 LAB — SODIUM, URINE, RANDOM: Sodium, Ur: 50 mmol/L

## 2019-02-26 LAB — CREATININE, URINE, RANDOM: Creatinine, Urine: 80.94 mg/dL

## 2019-02-26 MED ORDER — ONDANSETRON HCL 4 MG/2ML IJ SOLN
4.0000 mg | Freq: Four times a day (QID) | INTRAMUSCULAR | Status: DC | PRN
  Administered 2019-02-28 – 2019-03-11 (×6): 4 mg via INTRAVENOUS
  Filled 2019-02-26 (×6): qty 2

## 2019-02-26 NOTE — Progress Notes (Signed)
Placed pt. on CPAP Auto for h/s, remains on room air, humidifier refilled with sterile water, aware to notify if needed.

## 2019-02-26 NOTE — Consult Note (Signed)
[facility] Renal Consultation Note  Requesting MD: [place] Indication for Consultation:  AKI   Chief complaint: fatigue, abnormal labs PCP: Lucianne Lei   HPI:  [name] is a 85 y.o. female with a history of HTN, DM, and bradycardia s/p pacemaker placement who presented to the hospital with abnormal labs and fatigue.  She states that she had poor appetite with nausea and indigestion throughout last week and saw her PCP for the same on Friday; she had blood work drawn and was told that her renal function was abnormal.  She felt extremely weak and had shortness of breath with activity.  She ultimately presented to the ER 5/12.  She states that a couple of weeks ago she had been on daily Motrin for joint pain and prior to that had been back on daily Celebrex for a week.  (She took Celebrex for multiple years but stopped taking this several years ago.)   Food tastes bland.  She has noticed her urine output has improved.  Has trouble at baseline with urinary urgency.  She had been on her home telmisartan and to clarify she reports that she used to be on HCTZ but stopped taking this medicine several years ago.  Here in the  Hospital she has been initiated on IV fluids.  Her appetite is still poor but nausea is a little better.  Renal ultrasound was negative for hydronephrosis.  Baseline Cr 1.0 - 1.3 based on pre 2017 data.  Note covid negative.    PMHx:   Past Medical History:  Diagnosis Date  . Benign paroxysmal positional vertigo 04/06/2010  . 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 CAROTID BRUIT 02/24/2008  . DIABETES MELLITUS, TYPE II 02/24/2008  . HYPERTENSION 02/24/2008    Past Surgical History:  Procedure Laterality Date  . ABDOMINAL HYSTERECTOMY     1980's  . BACK SURGERY     x 3  . EP IMPLANTABLE DEVICE N/A 05/16/2016   Procedure: PPM Generator Changeout;  Surgeon: Thompson Grayer, MD;  Location: [facility];  Service: Cardiovascular;  Laterality: N/A;  . KNEE SURGERY  Right    2003  . KNEE SURGERY Left    2011  . PACEMAKER INSERTION      Family Hx:  Family History  Problem Relation Age of Onset  . Congestive Heart Failure Mother   . Tuberculosis Mother   . Lymphoma Mother   . Bone cancer Father   . Arthritis Father   . Breast cancer Sister   . Colon cancer Sister   . Hypertension Sister   . Dementia Sister     Social History:  reports that she has never smoked. She has never used smokeless tobacco. She reports that she does not drink alcohol or use drugs.  Allergies:  Allergies  Allergen Reactions  . Aspirin Other (See Comments)    REACTION: STOMACH ISSUES WITH DOSE HIGHER THAN 81 MG   . Penicillins Rash    Patient took injection and tablets, had a reaction. She has taken amoxicillin with no reaction Has patient had a PCN reaction causing immediate rash, facial/tongue/throat swelling, SOB or lightheadedness with hypotension: Yes Has patient had a PCN reaction causing severe rash involving mucus membranes or skin necrosis: Yes Has patient had a PCN reaction that required hospitalization No Has patient had a PCN reaction occurring within the last 10 years: No If all of the above answers are "NO", then m  . Brimonidine Itching  . Diltiazem Hcl  Rash  . Hydrocodone-Acetaminophen Nausea And Vomiting  . Morphine Nausea And Vomiting  . Neurontin [Gabapentin] Nausea And Vomiting    Medications: Prior to Admission medications   Medication Sig Start Date End Date Taking? Authorizing Provider  allopurinol (ZYLOPRIM) 100 MG tablet Take 100 mg by mouth daily.     Yes [provider]  amLODipine (NORVASC) 10 MG tablet Take 10 mg by mouth daily.   Yes [provider]  Coenzyme Q10 (CO Q 10 PO) Take 1 tablet by mouth daily.    Yes [provider]  dorzolamide (TRUSOPT) 2 % ophthalmic solution Place 1 drop into the left eye every evening.  12/21/17  Yes [provider]  fluorometholone (FML) 0.1 % ophthalmic  suspension Place 1 drop into the right eye 3 (three) times daily.  02/01/19  Yes [provider]  glimepiride (AMARYL) 1 MG tablet Take 0.5 mg by mouth 2 (two) times daily.    Yes [provider]  LUMIGAN 0.01 % SOLN Place 1 drop into the left eye every evening.  02/01/19  Yes [provider]  LYRICA 50 MG capsule Take 50 mg by mouth daily.  06/14/15  Yes [provider]  Multiple Vitamin (MULTIVITAMIN WITH MINERALS) TABS tablet Take 1 tablet by mouth daily.   Yes [provider]  NARCAN 4 MG/0.1ML LIQD nasal spray kit Place 1 Dose into the nose once as needed. overdose 12/07/18  Yes [provider]  pantoprazole (PROTONIX) 40 MG tablet Take 40 mg by mouth daily. 02/20/19  Yes [provider]  polyethylene glycol (MIRALAX / GLYCOLAX) 17 g packet Take 5.6 g by mouth at bedtime.   Yes [provider]  sodium chloride (OCEAN) 0.65 % SOLN nasal spray Place 1 spray into both nostrils as needed for congestion.   Yes [provider]  sucralfate (CARAFATE) 1 GM/10ML suspension Take 10 mLs by mouth 3 times/day as needed-between meals & bedtime. Acid reflux 02/21/19  Yes [provider]  telmisartan (MICARDIS) 80 MG tablet Take 80 mg by mouth daily.   Yes [provider]  traMADol (ULTRAM) 50 MG tablet Take 50-100 mg by mouth every 12 (twelve) hours as needed for pain. 11/25/18  Yes [provider]  hydrochlorothiazide (MICROZIDE) 12.5 MG capsule Take 1 capsule (12.5 mg total) by mouth daily. Patient not taking: Reported on 02/25/2019 03/10/15   Nita Sells, MD    I have reviewed the patient's current medications.  Labs:  BMP Latest Ref Rng & Units 02/26/2019 02/25/2019 05/11/2016  Glucose 70 - 99 mg/dL 88 127(H) 89  BUN 8 - 23 mg/dL 45(H) 50(H) 21  Creatinine 0.44 - 1.00 mg/dL 4.21(H) 4.46(H) 1.03(H)  Sodium 135 - 145 mmol/L 138 137 141  Potassium 3.5 - 5.1 mmol/L 3.5 3.1(L) 4.0  Chloride 98 - 111  mmol/L 104 101 106  CO2 22 - 32 mmol/L _0 Calcium 8.9 - 10.3 mg/dL 10.2 10.8(H) 9.0    Urinalysis    Component Value Date/Time   COLORURINE STRAW (A) 02/25/2019 1103   APPEARANCEUR CLEAR 02/25/2019 1103   LABSPEC 1.008 02/25/2019 1103   PHURINE 6.0 02/25/2019 1103   GLUCOSEU NEGATIVE 02/25/2019 1103   HGBUR SMALL (A) 02/25/2019 1103   BILIRUBINUR NEGATIVE 02/25/2019 1103   KETONESUR NEGATIVE 02/25/2019 1103   PROTEINUR 30 (A) 02/25/2019 1103   UROBILINOGEN 1.0 10/05/2012 1953   NITRITE NEGATIVE 02/25/2019 1103   LEUKOCYTESUR TRACE (A) 02/25/2019 1103     ROS:  Pertinent  items noted in HPI and remainder of comprehensive ROS otherwise negative.   Physical Exam: Vitals:   02/26/19 0908 02/26/19 1155  BP: (!) 138/58   Pulse: 70   Resp:    Temp:    SpO2:  94%     General: Elderly female in bed in no acute distress at rest HEENT: Normocephalic atraumatic Eyes: Extraocular movements intact sclera anicteric Neck: Supple trachea midline no JVD Heart: Regular rate and rhythm no rubs Lungs: Clear to auscultation bilaterally normal work of breathing at rest Abdomen: Soft nontender nondistended normal bowel sounds Extremities: No pitting edema appreciated no cyanosis or clubbing Skin: No rash on extremities exposed Neuro: Alert and oriented x3 provides a history and follows commands GU: No Foley  Assessment/Plan:  # AKI  - Multifactorial pre-renal insults with decreased PO intake, ARB use, and NSAID's.  With concurrent anemia also appropriate to rule out plasma cell dyscrasia.  0-5 RBC with 30 mg/dL protein - Nonoliguric and improving with conservative management  - Continue hydration as tolerated - Check urine protein/cr ratio  - Would hold micardis.  (Hold HCTZ - she is not taking this medication any longer) - Strict ins/outs  - Will obtain records from her PCP to establish a more recent baseline  - Add urine culture   # CKD stage III - Pre-2017 data indicates  mild CKD with baseline 1 -1.3, likely secondary to DM nephropathy and microvascular disease from HTN  # Anemia  - Denies overt GI losses but do not NSAID use, now off - May be secondary in part to AKI  - Will send SPEP, UPEP, and free light chains with concurrent AKI and anemia   # HTN  - Acceptable control; avoid hypotension  - Would hold micardis with AKI and no longer on HCTZ  # Proteinuria - Check urine protein/cr ratio   # DM  - Per primary team.  Please avoid metformin  - Quantify urine protein losses    Claudia Desanctis 02/26/2019, 1:48 PM

## 2019-02-26 NOTE — Progress Notes (Signed)
Records from patients PCP retrieved as ordered.

## 2019-02-26 NOTE — Progress Notes (Signed)
[facility]  PROGRESS NOTE  [name] ZDG:[id number] DOB: November 20, 1929 DOA: 02/25/2019 PCP: Lucianne Lei, MD   Brief HPI:   85 year old female with a history of hypertension, diabetes mellitus, bradycardia, status post pacemaker placement came to hospital with abnormal labs and fatigue.  Patient was seen by her PCP had labs drawn which showed that renal function was abnormal.  Patient has been taking Motrin off and on for past 1 month for chronic back pain.  Patient has HCTZ mentioning med rec list but has not been taking that for quite a while.  She does take Micardis for hypertension.  Patient baseline creatinine was around 1.0 per labs shown in epic in 2017.  COVID-19 test is negative    Subjective   Patient seen and examined, denies chest pain or shortness of breath.  No abdominal pain.   Assessment/Plan:     1. Acute kidney injury-patient presented with creatinine of 4.46 on admission, last creatinine was 1.0 and 2017.  Likely multifactorial from Motrin use, poor p.o. intake, myocarditis.  Renal ultrasound obtained showed no hydronephrosis.Started on IV normal saline.  Creatinine today has somewhat improved to 4.21.  Will consult nephrology for further recommendations as I am concerned that patient has some intrinsic renal disease due to underlying diabetes mellitus, hypertension.  We will also obtain urine sodium, urine creatinine.  2. Hypertension-blood pressure well controlled on Norvasc 10 mg daily, Micardis 80 mg daily.  She does not take HCTZ.  Will hold Micardis due to worsening renal function.  Continue amlodipine.  3. Diabetes mellitus type 2-we will start sliding scale insulin with NovoLog.  4. Gout-stable, continue allopurinol.  5. 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  6. Anemia of chronic disease-patient's hemoglobin is 7.8, likely from underlying renal disease.  FOBT obtained during this hospitalization is  negative     CBG: Recent Labs  Lab 02/25/19 1620 02/25/19 2215 02/26/19 0742 02/26/19 1143 02/26/19 1701  GLUCAP 82 77 72 86 98    CBC: Recent Labs  Lab 02/25/19 1103 02/25/19 1511 02/26/19 0353  WBC 4.4 5.3 4.7  NEUTROABS 2.5  --   --   HGB 8.6* 8.3* 7.8*  HCT 26.4* 25.7* 25.6*  MCV 91.0 90.5 93.4  PLT 181 191 595    Basic Metabolic Panel: Recent Labs  Lab 02/25/19 1103 02/26/19 0353  NA 137 138  K 3.1* 3.5  CL 101 104  CO2 23 23  GLUCOSE 127* 88  BUN 50* 45*  CREATININE 4.46* 4.21*  CALCIUM 10.8* 10.2  MG 2.3  --      DVT prophylaxis: Heparin  Code Status: Full code  Family Communication: No family at bedside  Disposition Plan: likely home when medically ready for discharge     Consultants:  None  Procedures:  None   Antibiotics:   Anti-infectives (From admission, onward)   None       Objective   Vitals:   02/26/19 0526 02/26/19 0908 02/26/19 1155 02/26/19 1401  BP: (!) 121/57 (!) 138/58  (!) 149/67  Pulse: 70 70  72  Resp: 14   18  Temp: 98.3 F (36.8 C)   98.8 F (37.1 C)  TempSrc: Oral   Oral  SpO2: 99%  94% 100%  Weight:      Height:        Intake/Output Summary (Last 24 hours) at 02/26/2019 1745 Last data filed at 02/26/2019 1426 Gross per 24 hour  Intake 1315.78 ml  Output 800 ml  Net 515.78 ml   Filed Weights   02/25/19 1012  Weight: 81.2 kg     Physical Examination:    General: Appears in no acute distress  Cardiovascular: S1-S2, regular, no murmur auscultated.  Respiratory: Clear to auscultation bilaterally, no wheezing or crackles.  Abdomen: Abdomen is soft, nontender, no organomegaly  Extremities: No edema in the lower extremities  Neurologic: Cranial nerves II through grossly intact, no focal deficit noted.     Data Reviewed: I have personally reviewed following labs and imaging studies   Recent Results (from the past 240 hour(s))  SARS Coronavirus 2 (CEPHEID - Performed in [place] hospital lab), Hosp Order     Status: None   Collection Time: 02/25/19 11:58 AM  Result Value Ref Range Status   SARS Coronavirus 2 NEGATIVE NEGATIVE Final    Comment: (NOTE) If result is NEGATIVE SARS-CoV-2 target nucleic acids are NOT DETECTED. The SARS-CoV-2 RNA is generally detectable in upper and lower  respiratory specimens during the acute phase of infection. The lowest  concentration of SARS-CoV-2 viral copies this assay can detect is 250  copies / mL. A negative result does not preclude SARS-CoV-2 infection  and should not be used as the sole basis for treatment or other  patient management decisions.  A negative result may occur with  improper specimen collection / handling, submission of specimen other  than nasopharyngeal swab, presence of viral mutation(s) within the  areas targeted by this assay, and inadequate number of viral copies  (<250 copies / mL). A negative result must be combined with clinical  observations, patient history, and epidemiological information. If result is POSITIVE SARS-CoV-2 target nucleic acids are DETECTED. The SARS-CoV-2 RNA is generally detectable in upper and lower  respiratory specimens dur ing the acute phase of infection.  Positive  results are indicative of active infection with SARS-CoV-2.  Clinical  correlation with patient history and other diagnostic information is  necessary to determine patient infection status.  Positive results do  not rule out bacterial infection or co-infection with other viruses. If result is PRESUMPTIVE POSTIVE SARS-CoV-2 nucleic acids MAY BE PRESENT.   A presumptive positive result was obtained on the submitted specimen  and confirmed on repeat testing.  While 2019 novel coronavirus  (SARS-CoV-2) nucleic acids may be present in the submitted sample  additional confirmatory testing may be necessary for epidemiological  and / or clinical management purposes  to differentiate between  SARS-CoV-2 and  other Sarbecovirus currently known to infect humans.  If clinically indicated additional testing with an alternate test  methodology [phone]) is advised. The SARS-CoV-2 RNA is generally  detectable in upper and lower respiratory sp ecimens during the acute  phase of infection. The expected result is Negative. Fact Sheet for Patients:  StrictlyIdeas.no Fact Sheet for Healthcare Providers: BankingDealers.co.za This test is not yet approved or cleared by the [place] FDA and has been authorized for detection and/or diagnosis of SARS-CoV-2 by FDA under an Emergency Use Authorization (EUA).  This EUA will remain in effect (meaning this test can be used) for the duration of the COVID-19 declaration under Section 564(b)(1) of the Act, 21 U.S.C. section 360bbb-3(b)(1), unless the authorization is terminated or revoked sooner. Performed at [facility], [place] [address], [place], [place] [address]      Liver Function Tests: Recent Labs  Lab 02/25/19 1103 02/26/19 0353  AST 18 18  ALT 12 13  ALKPHOS 30* 26*  BILITOT 0.8 0.3  PROT 10.7*  9.4*  ALBUMIN 3.0* 2.5*   Recent Labs  Lab 02/25/19 1103  LIPASE 26      Studies: Dg Chest 1 View  Result Date: 02/25/2019 CLINICAL DATA:  Acute kidney injury and cough. EXAM: CHEST  1 VIEW COMPARISON:  None. FINDINGS: Left chest wall pacer device is noted with leads in the right atrial appendage and right ventricle. Mild cardiac enlargement. Aortic atherosclerosis. No pleural effusion or edema. No airspace opacities. IMPRESSION: 1. Cardiac enlargement.  No acute findings. 2.  Aortic Atherosclerosis (ICD10-I70.0). Electronically Signed   By: Kerby Moors M.D.   On: 02/25/2019 13:51   US Renal  Result Date: 02/25/2019 CLINICAL DATA:  85 year old female with acute renal failure EXAM: RENAL / URINARY TRACT ULTRASOUND COMPLETE COMPARISON:  None. FINDINGS: Right Kidney: Renal  measurements: 11.9 x 4.1 x 4.3 cm = volume: 111 mL . Echogenicity within normal limits. No mass or hydronephrosis visualized. Left Kidney: Renal measurements: 12.5 x 4.2 x 4.2 cm = volume: 115 mL. Two renal cysts are present measuring 1.2 cm. Echogenicity within normal limits. No mass or hydronephrosis visualized. Bladder: Appears normal for degree of bladder distention, but bladder not well distended. IMPRESSION: 1. Unremarkable kidneys except for 2 small LEFT renal cysts. No evidence of hydronephrosis. Electronically Signed   By: Margarette Canada M.D.   On: 02/25/2019 16:00    Scheduled Meds: . allopurinol  100 mg Oral Daily  . amLODipine  10 mg Oral Daily  . dorzolamide  1 drop Left Eye QPM  . fluorometholone  1 drop Right Eye TID  . heparin  5,000 Units Subcutaneous Q8H  . insulin aspart  0-9 Units Subcutaneous TID WC  . latanoprost  1 drop Left Eye QHS  . pregabalin  50 mg Oral Daily    Admission status: Inpatient: Based on patients clinical presentation and evaluation of above clinical data, I have made determination that patient meets Inpatient criteria at this time.  Time spent: 25 min  [facility] Pager [phone]. If 7PM-7AM, please contact night-coverage at [email or web address], Office  [phone]  password [email or web address]  02/26/2019, 5:45 PM  LOS: 0 days

## 2019-02-27 DIAGNOSIS — E876 Hypokalemia: Secondary | ICD-10-CM

## 2019-02-27 LAB — BASIC METABOLIC PANEL
Anion gap: 9 (ref 5–15)
BUN: 43 mg/dL — ABNORMAL HIGH (ref 8–23)
CO2: 22 mmol/L (ref 22–32)
Calcium: 9.6 mg/dL (ref 8.9–10.3)
Chloride: 108 mmol/L (ref 98–111)
Creatinine, Ser: 4.11 mg/dL — ABNORMAL HIGH (ref 0.44–1.00)
GFR calc Af Amer: 11 mL/min — ABNORMAL LOW (ref >60)
GFR calc non Af Amer: 9 mL/min — ABNORMAL LOW (ref >60)
Glucose, Bld: 84 mg/dL (ref 70–99)
Potassium: 3.2 mmol/L — ABNORMAL LOW (ref 3.5–5.1)
Sodium: 139 mmol/L (ref 135–145)

## 2019-02-27 LAB — KAPPA/LAMBDA LIGHT CHAINS
Kappa free light chain: 9676.6 mg/L — ABNORMAL HIGH (ref 3.3–19.4)
Kappa, lambda light chain ratio: 1209.58 — ABNORMAL HIGH (ref 0.26–1.65)
Lambda free light chains: 8 mg/L (ref 5.7–26.3)

## 2019-02-27 LAB — GLUCOSE, CAPILLARY
Glucose-Capillary: 76 mg/dL (ref 70–99)
Glucose-Capillary: 99 mg/dL (ref 70–99)
Glucose-Capillary: 72 mg/dL (ref 70–99)
Glucose-Capillary: 103 mg/dL — ABNORMAL HIGH (ref 70–99)

## 2019-02-27 LAB — PROTEIN ELECTROPHORESIS, SERUM
A/G Ratio: 0.5 — ABNORMAL LOW (ref 0.7–1.7)
Albumin ELP: 3.3 g/dL (ref 2.9–4.4)
Alpha-1-Globulin: 0.3 g/dL (ref 0.0–0.4)
Alpha-2-Globulin: 0.8 g/dL (ref 0.4–1.0)
Beta Globulin: 4.9 g/dL — ABNORMAL HIGH (ref 0.7–1.3)
Gamma Globulin: 0.3 g/dL — ABNORMAL LOW (ref 0.4–1.8)
Globulin, Total: 6.2 g/dL — ABNORMAL HIGH (ref 2.2–3.9)
M-Spike, %: 4.1 g/dL — ABNORMAL HIGH
Total Protein ELP: 9.5 g/dL — ABNORMAL HIGH (ref 6.0–8.5)

## 2019-02-27 LAB — URINE CULTURE: Culture: 50000 — AB

## 2019-02-27 MED ORDER — POLYETHYLENE GLYCOL 3350 17 G PO PACK: 17.0000 g | PACK | Freq: Every day | ORAL | Status: DC

## 2019-02-27 MED ORDER — POLYETHYLENE GLYCOL 3350 17 G PO PACK
17.0000 g | PACK | Freq: Every day | ORAL | Status: DC | PRN
  Administered 2019-02-27 – 2019-03-05 (×4): 17 g via ORAL
  Filled 2019-02-27 (×4): qty 1

## 2019-02-27 MED ORDER — BISACODYL 10 MG RE SUPP
10.0000 mg | Freq: Once | RECTAL | Status: DC
  Filled 2019-02-27: qty 1

## 2019-02-27 MED ORDER — POTASSIUM CHLORIDE CRYS ER 20 MEQ PO TBCR
20.0000 meq | EXTENDED_RELEASE_TABLET | Freq: Two times a day (BID) | ORAL | Status: AC
  Administered 2019-02-27 (×2): 20 meq via ORAL
  Filled 2019-02-27 (×2): qty 1

## 2019-02-27 MED ORDER — DARBEPOETIN ALFA 150 MCG/0.3ML IJ SOSY
150.0000 ug | PREFILLED_SYRINGE | Freq: Once | INTRAMUSCULAR | Status: AC
  Administered 2019-02-27: 150 ug via SUBCUTANEOUS
  Filled 2019-02-27: qty 0.3

## 2019-02-27 NOTE — Progress Notes (Signed)
RT notified by RN that pt. is unable to tolerate nasal mask this evening, to ask son to possibly bring in hers from home.

## 2019-02-27 NOTE — Progress Notes (Signed)
[facility]  PROGRESS NOTE  [name] GEX:[id number] DOB: [birth year] DOA: 02/25/2019 PCP: Lucianne Lei, MD   Brief HPI:   85 year old female with a history of hypertension, diabetes mellitus, bradycardia, status post pacemaker placement came to hospital with abnormal labs and fatigue.  Patient was seen by her PCP had labs drawn which showed that renal function was abnormal.  Patient has been taking Motrin off and on for past 1 month for chronic back pain.  Patient has HCTZ mentioning med rec list but has not been taking that for quite a while.  She does take Micardis for hypertension.  Patient baseline creatinine was around 1.0 per labs shown in epic in 2017.  COVID-19 test is negative    Subjective   Patient seen and examined, denies abdominal pain.  No nausea vomiting.   Assessment/Plan:     1. Acute kidney injury-patient presented with creatinine of 4.46 on admission, last creatinine was 1.0 and 2017.  Likely multifactorial from Motrin use, poor p.o. intake, ARB .  Renal ultrasound obtained showed no hydronephrosis. Started on IV normal saline.  Nephrology was consulted.  Creatinine today has  improved to 4.11.   2. Hypertension-blood pressure well controlled on Norvasc 10 mg daily, Micardis 80 mg daily.  She does not take HCTZ.  Will hold Micardis due to worsening renal function.  Continue amlodipine.  3. Diabetes mellitus type 2-we will start sliding scale insulin with NovoLog.  4. Gout-stable, continue allopurinol.  5. 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  6. Anemia of chronic disease-patient's hemoglobin is 7.8, likely from underlying renal disease.  FOBT obtained during this hospitalization is negative.  7. Hypokalemia-potassium was 3.2, potassium is being replaced, will check BMP in a.m.     CBG: Recent Labs  Lab 02/26/19 1143 02/26/19 1701 02/26/19 2052 02/27/19 0724 02/27/19 1147  GLUCAP 86 98 70 76 99     CBC: Recent Labs  Lab 02/25/19 1103 02/25/19 1511 02/26/19 0353  WBC 4.4 5.3 4.7  NEUTROABS 2.5  --   --   HGB 8.6* 8.3* 7.8*  HCT 26.4* 25.7* 25.6*  MCV 91.0 90.5 93.4  PLT 181 191 010    Basic Metabolic Panel: Recent Labs  Lab 02/25/19 1103 02/26/19 0353 02/27/19 0438  NA 137 138 139  K 3.1* 3.5 3.2*  CL 101 104 108  CO2 23 23 22   GLUCOSE 127* 88 84  BUN 50* 45* 43*  CREATININE 4.46* 4.21* 4.11*  CALCIUM 10.8* 10.2 9.6  MG 2.3  --   --      DVT prophylaxis: Heparin  Code Status: Full code  Family Communication: No family at bedside  Disposition Plan: likely home when medically ready for discharge     Consultants:  None  Procedures:  None   Antibiotics:   Anti-infectives (From admission, onward)   None       Objective   Vitals:   02/26/19 2058 02/27/19 0458 02/27/19 0951 02/27/19 1219  BP: (!) 137/57 (!) 142/61 (!) 128/54 (!) 141/63  Pulse: 73 69 71 69  Resp: 18 16  18   Temp: 98.6 F (37 C) 98.9 F (37.2 C)  98.4 F (36.9 C)  TempSrc: Oral Oral  Oral  SpO2: 96% 96%  97%  Weight:      Height:        Intake/Output Summary (Last 24 hours) at 02/27/2019 1339 Last data filed at 02/27/2019 1300 Gross per 24 hour  Intake 1339.82 ml  Output 1825  ml  Net -485.18 ml   Filed Weights   02/25/19 1012  Weight: 81.2 kg     Physical Examination:   General: Appears in no acute distress  Cardiovascular: S1-S2, regular, no murmur auscultated  Respiratory: Clear to auscultation bilaterally  Abdomen: Abdomen is soft, nontender, no organomegaly  Extremities: No edema in the lower extremities     Data Reviewed: I have personally reviewed following labs and imaging studies   Recent Results (from the past 240 hour(s))  SARS Coronavirus 2 (CEPHEID - Performed in [place] hospital lab), Hosp Order     Status: None   Collection Time: 02/25/19 11:58 AM  Result Value Ref Range Status   SARS Coronavirus 2 NEGATIVE NEGATIVE  Final    Comment: (NOTE) If result is NEGATIVE SARS-CoV-2 target nucleic acids are NOT DETECTED. The SARS-CoV-2 RNA is generally detectable in upper and lower  respiratory specimens during the acute phase of infection. The lowest  concentration of SARS-CoV-2 viral copies this assay can detect is 250  copies / mL. A negative result does not preclude SARS-CoV-2 infection  and should not be used as the sole basis for treatment or other  patient management decisions.  A negative result may occur with  improper specimen collection / handling, submission of specimen other  than nasopharyngeal swab, presence of viral mutation(s) within the  areas targeted by this assay, and inadequate number of viral copies  (<250 copies / mL). A negative result must be combined with clinical  observations, patient history, and epidemiological information. If result is POSITIVE SARS-CoV-2 target nucleic acids are DETECTED. The SARS-CoV-2 RNA is generally detectable in upper and lower  respiratory specimens dur ing the acute phase of infection.  Positive  results are indicative of active infection with SARS-CoV-2.  Clinical  correlation with patient history and other diagnostic information is  necessary to determine patient infection status.  Positive results do  not rule out bacterial infection or co-infection with other viruses. If result is PRESUMPTIVE POSTIVE SARS-CoV-2 nucleic acids MAY BE PRESENT.   A presumptive positive result was obtained on the submitted specimen  and confirmed on repeat testing.  While 2019 novel coronavirus  (SARS-CoV-2) nucleic acids may be present in the submitted sample  additional confirmatory testing may be necessary for epidemiological  and / or clinical management purposes  to differentiate between  SARS-CoV-2 and other Sarbecovirus currently known to infect humans.  If clinically indicated additional testing with an alternate test  methodology [phone]) is advised. The  SARS-CoV-2 RNA is generally  detectable in upper and lower respiratory sp ecimens during the acute  phase of infection. The expected result is Negative. Fact Sheet for Patients:  StrictlyIdeas.no Fact Sheet for Healthcare Providers: BankingDealers.co.za This test is not yet approved or cleared by the [place] FDA and has been authorized for detection and/or diagnosis of SARS-CoV-2 by FDA under an Emergency Use Authorization (EUA).  This EUA will remain in effect (meaning this test can be used) for the duration of the COVID-19 declaration under Section 564(b)(1) of the Act, 21 U.S.C. section 360bbb-3(b)(1), unless the authorization is terminated or revoked sooner. Performed at [facility], [place] [address] [place], [place] [address]      Liver Function Tests: Recent Labs  Lab 02/25/19 1103 02/26/19 0353  AST 18 18  ALT 12 13  ALKPHOS 30* 26*  BILITOT 0.8 0.3  PROT 10.7* 9.4*  ALBUMIN 3.0* 2.5*   Recent Labs  Lab 02/25/19 1103  LIPASE  26      Studies: Dg Chest 1 View  Result Date: 02/25/2019 CLINICAL DATA:  Acute kidney injury and cough. EXAM: CHEST  1 VIEW COMPARISON:  None. FINDINGS: Left chest wall pacer device is noted with leads in the right atrial appendage and right ventricle. Mild cardiac enlargement. Aortic atherosclerosis. No pleural effusion or edema. No airspace opacities. IMPRESSION: 1. Cardiac enlargement.  No acute findings. 2.  Aortic Atherosclerosis (ICD10-I70.0). Electronically Signed   By: Kerby Moors M.D.   On: 02/25/2019 13:51   US Renal  Result Date: 02/25/2019 CLINICAL DATA:  85 year old female with acute renal failure EXAM: RENAL / URINARY TRACT ULTRASOUND COMPLETE COMPARISON:  None. FINDINGS: Right Kidney: Renal measurements: 11.9 x 4.1 x 4.3 cm = volume: 111 mL . Echogenicity within normal limits. No mass or hydronephrosis visualized. Left Kidney: Renal measurements: 12.5 x  4.2 x 4.2 cm = volume: 115 mL. Two renal cysts are present measuring 1.2 cm. Echogenicity within normal limits. No mass or hydronephrosis visualized. Bladder: Appears normal for degree of bladder distention, but bladder not well distended. IMPRESSION: 1. Unremarkable kidneys except for 2 small LEFT renal cysts. No evidence of hydronephrosis. Electronically Signed   By: Margarette Canada M.D.   On: 02/25/2019 16:00    Scheduled Meds: . allopurinol  100 mg Oral Daily  . amLODipine  10 mg Oral Daily  . darbepoetin (ARANESP) injection - NON-DIALYSIS  150 mcg Subcutaneous Once  . dorzolamide  1 drop Left Eye QPM  . fluorometholone  1 drop Right Eye TID  . heparin  5,000 Units Subcutaneous Q8H  . insulin aspart  0-9 Units Subcutaneous TID WC  . latanoprost  1 drop Left Eye QHS  . potassium chloride  20 mEq Oral BID  . pregabalin  50 mg Oral Daily    Admission status: Inpatient: Based on patients clinical presentation and evaluation of above clinical data, I have made determination that patient meets Inpatient criteria at this time.  Time spent: 25 min  [facility] Pager [phone]. If 7PM-7AM, please contact night-coverage at [email or web address], Office  [phone]  password [email or web address]  02/27/2019, 1:39 PM  LOS: 1 day

## 2019-02-27 NOTE — Progress Notes (Signed)
Pt stated CPAP felt uncomfortable and wishes not to wear it tonight. RRT notified. Will continue to monitor.

## 2019-02-27 NOTE — Progress Notes (Addendum)
Patient reports having difficulty swallowing medications and  food. Pt described feeling that the food and or medication is coming back up. Pt also reports coughing.  Provider updated.

## 2019-02-27 NOTE — Progress Notes (Signed)
Subjective:  1500 of UOP...Marland Kitchen. crt down a little.  She thinks she is maybe a little better  Objective Vital signs in last 24 hours: Vitals:   02/26/19 1401 02/26/19 2058 02/27/19 0458 02/27/19 0951  BP: (!) 149/67 (!) 137/57 (!) 142/61 (!) 128/54  Pulse: 72 73 69 71  Resp: 18 18 16    Temp: 98.8 F (37.1 C) 98.6 F (37 C) 98.9 F (37.2 C)   TempSrc: Oral Oral Oral   SpO2: 100% 96% 96%   Weight:      Height:       Weight change:   Intake/Output Summary (Last 24 hours) at 02/27/2019 1124 Last data filed at 02/27/2019 1008 Gross per 24 hour  Intake 1219.82 ml  Output 1625 ml  Net -405.18 ml    Assessment/ Plan: Pt is a 85 y.o. yo female with DM, HTN, unknown baseline renal status (crt 1.0 to 1.3 in 2017 ) who was admitted on 02/25/2019 with AKI (vs chronic)  in the setting of multiple recent renal insults (NSAIDS/ARB)  Assessment/Plan: 1. AKI- recent NSAIDS and ARB. urinalysis pretty bland- 6-10 WBC but with trace leuks and neg nitrite, urine culture pending.  Renal ultrasound pretty unremarkable.  Protein to crt ratio 1.8- 30 on the dip.  SPEP also pending in the setting of anemia, hypercalcemia and hyperproteinemia.  Good UOP and crt trending down albeit slowly   2. Hypercalcemia - improving with IVF- SPEP and other labs pending  - no exogenous calcium  3. Anemia- worsening with IVF- check iron stores and give one time dose of darbe - SPEP pending  4. HTN/volume- BP reasonable on norvasc 10 and PRN hydralazine - will decrease IVF 5. Hypokalemia- replete   Louis Meckel    Labs: Basic Metabolic Panel: Recent Labs  Lab 02/25/19 1103 02/26/19 0353 02/27/19 0438  NA 137 138 139  K 3.1* 3.5 3.2*  CL 101 104 108  CO2 23 23 22   GLUCOSE 127* 88 84  BUN 50* 45* 43*  CREATININE 4.46* 4.21* 4.11*  CALCIUM 10.8* 10.2 9.6   Liver Function Tests: Recent Labs  Lab 02/25/19 1103 02/26/19 0353  AST 18 18  ALT 12 13  ALKPHOS 30* 26*  BILITOT 0.8 0.3  PROT 10.7* 9.4*   ALBUMIN 3.0* 2.5*   Recent Labs  Lab 02/25/19 1103  LIPASE 26   No results for input(s): AMMONIA in the last 168 hours. CBC: Recent Labs  Lab 02/25/19 1103 02/25/19 1511 02/26/19 0353  WBC 4.4 5.3 4.7  NEUTROABS 2.5  --   --   HGB 8.6* 8.3* 7.8*  HCT 26.4* 25.7* 25.6*  MCV 91.0 90.5 93.4  PLT 181 191 184   Cardiac Enzymes: No results for input(s): CKTOTAL, CKMB, CKMBINDEX, TROPONINI in the last 168 hours. CBG: Recent Labs  Lab 02/26/19 0742 02/26/19 1143 02/26/19 1701 02/26/19 2052 02/27/19 0724  GLUCAP 72 86 98 70 76    Iron Studies: No results for input(s): IRON, TIBC, TRANSFERRIN, FERRITIN in the last 72 hours. Studies/Results: Dg Chest 1 View  Result Date: 02/25/2019 CLINICAL DATA:  Acute kidney injury and cough. EXAM: CHEST  1 VIEW COMPARISON:  None. FINDINGS: Left chest wall pacer device is noted with leads in the right atrial appendage and right ventricle. Mild cardiac enlargement. Aortic atherosclerosis. No pleural effusion or edema. No airspace opacities. IMPRESSION: 1. Cardiac enlargement.  No acute findings. 2.  Aortic Atherosclerosis (ICD10-I70.0). Electronically Signed   By: Kerby Moors M.D.   On: 02/25/2019  13:51   US Renal  Result Date: 02/25/2019 CLINICAL DATA:  85 year old female with acute renal failure EXAM: RENAL / URINARY TRACT ULTRASOUND COMPLETE COMPARISON:  None. FINDINGS: Right Kidney: Renal measurements: 11.9 x 4.1 x 4.3 cm = volume: 111 mL . Echogenicity within normal limits. No mass or hydronephrosis visualized. Left Kidney: Renal measurements: 12.5 x 4.2 x 4.2 cm = volume: 115 mL. Two renal cysts are present measuring 1.2 cm. Echogenicity within normal limits. No mass or hydronephrosis visualized. Bladder: Appears normal for degree of bladder distention, but bladder not well distended. IMPRESSION: 1. Unremarkable kidneys except for 2 small LEFT renal cysts. No evidence of hydronephrosis. Electronically Signed   By: Margarette Canada M.D.   On:  02/25/2019 16:00   Medications: Infusions: . sodium chloride 100 mL/hr at 02/27/19 0956    Scheduled Medications: . allopurinol  100 mg Oral Daily  . amLODipine  10 mg Oral Daily  . dorzolamide  1 drop Left Eye QPM  . fluorometholone  1 drop Right Eye TID  . heparin  5,000 Units Subcutaneous Q8H  . insulin aspart  0-9 Units Subcutaneous TID WC  . latanoprost  1 drop Left Eye QHS  . pregabalin  50 mg Oral Daily    have reviewed scheduled and prn medications.  Physical Exam: General: asleep but arousable and very alert  Heart: RRR Lungs: mostly clear Abdomen: soft, non tender Extremities: no edema     02/27/2019,11:24 AM  LOS: 1 day

## 2019-02-28 ENCOUNTER — Inpatient Hospital Stay (HOSPITAL_COMMUNITY): Payer: Medicare HMO

## 2019-02-28 ENCOUNTER — Encounter (HOSPITAL_COMMUNITY): Payer: Self-pay | Admitting: Oncology

## 2019-02-28 DIAGNOSIS — R079 Chest pain, unspecified: Secondary | ICD-10-CM

## 2019-02-28 DIAGNOSIS — M898X9 Other specified disorders of bone, unspecified site: Secondary | ICD-10-CM

## 2019-02-28 LAB — MRSA PCR SCREENING: MRSA by PCR: NEGATIVE

## 2019-02-28 LAB — CBC
HCT: 24.3 % — ABNORMAL LOW (ref 36.0–46.0)
Hemoglobin: 7.7 g/dL — ABNORMAL LOW (ref 12.0–15.0)
MCH: 29.6 pg (ref 26.0–34.0)
MCHC: 31.7 g/dL (ref 30.0–36.0)
MCV: 93.5 fL (ref 80.0–100.0)
Platelets: 164 K/uL (ref 150–400)
RBC: 2.6 MIL/uL — ABNORMAL LOW (ref 3.87–5.11)
RDW: 14.3 % (ref 11.5–15.5)
WBC: 5 K/uL (ref 4.0–10.5)
nRBC: 0 % (ref 0.0–0.2)

## 2019-02-28 LAB — TROPONIN I
Troponin I: 0.12 ng/mL (ref <0.03)
Troponin I: 0.15 ng/mL (ref <0.03)

## 2019-02-28 LAB — RENAL FUNCTION PANEL
Albumin: 2.5 g/dL — ABNORMAL LOW (ref 3.5–5.0)
Anion gap: 9 (ref 5–15)
BUN: 42 mg/dL — ABNORMAL HIGH (ref 8–23)
CO2: 22 mmol/L (ref 22–32)
Calcium: 10 mg/dL (ref 8.9–10.3)
Chloride: 108 mmol/L (ref 98–111)
Creatinine, Ser: 4.03 mg/dL — ABNORMAL HIGH (ref 0.44–1.00)
GFR calc Af Amer: 11 mL/min — ABNORMAL LOW (ref >60)
GFR calc non Af Amer: 9 mL/min — ABNORMAL LOW (ref >60)
Glucose, Bld: 91 mg/dL (ref 70–99)
Phosphorus: 3.7 mg/dL (ref 2.5–4.6)
Potassium: 3.8 mmol/L (ref 3.5–5.1)
Sodium: 139 mmol/L (ref 135–145)

## 2019-02-28 LAB — GLUCOSE, CAPILLARY
Glucose-Capillary: 92 mg/dL (ref 70–99)
Glucose-Capillary: 106 mg/dL — ABNORMAL HIGH (ref 70–99)
Glucose-Capillary: 109 mg/dL — ABNORMAL HIGH (ref 70–99)
Glucose-Capillary: 109 mg/dL — ABNORMAL HIGH (ref 70–99)
Glucose-Capillary: 148 mg/dL — ABNORMAL HIGH (ref 70–99)

## 2019-02-28 LAB — IRON AND TIBC
Iron: 61 ug/dL (ref 28–170)
Saturation Ratios: 28 % (ref 10.4–31.8)
TIBC: 215 ug/dL — ABNORMAL LOW (ref 250–450)
UIBC: 154 ug/dL

## 2019-02-28 LAB — FERRITIN: Ferritin: 180 ng/mL (ref 11–307)

## 2019-02-28 MED ORDER — FENTANYL CITRATE (PF) 100 MCG/2ML IJ SOLN
50.0000 ug | INTRAMUSCULAR | Status: DC | PRN
  Administered 2019-02-28: 50 ug via INTRAVENOUS
  Filled 2019-02-28: qty 2

## 2019-02-28 MED ORDER — NITROGLYCERIN IN D5W 200-5 MCG/ML-% IV SOLN
0.0000 ug/min | INTRAVENOUS | Status: DC
  Administered 2019-02-28: 5 ug/min via INTRAVENOUS
  Filled 2019-02-28: qty 250

## 2019-02-28 MED ORDER — SODIUM CHLORIDE 0.9 % IV SOLN
510.0000 mg | INTRAVENOUS | Status: AC
  Administered 2019-02-28 – 2019-03-07 (×2): 510 mg via INTRAVENOUS
  Filled 2019-02-28 (×2): qty 17

## 2019-02-28 MED ORDER — MORPHINE SULFATE (PF) 2 MG/ML IV SOLN
INTRAVENOUS | Status: AC
  Filled 2019-02-28: qty 1

## 2019-02-28 MED ORDER — NITROGLYCERIN 0.4 MG SL SUBL
SUBLINGUAL_TABLET | SUBLINGUAL | Status: AC
  Filled 2019-02-28: qty 1

## 2019-02-28 MED ORDER — DEXAMETHASONE SODIUM PHOSPHATE 4 MG/ML IJ SOLN
8.0000 mg | Freq: Every day | INTRAMUSCULAR | Status: DC
  Administered 2019-02-28: 8 mg via INTRAVENOUS
  Filled 2019-02-28: qty 2

## 2019-02-28 NOTE — Consult Note (Addendum)
[place]  [phone] [phone]    [place]  Referring MD: Dr. Eleonore Chiquito  I have seen the patient, reviewed her history and edited the report and agree with documentation as outlined below Reason for Referral: Elevated M-spike and light chains, anemia, hypercalcemia  HPI: [name] is an 85 year old female with a past medical history including diabetes, obstructive sleep apnea (currently on CPAP), hypertension, sick sinus syndrome (has a pacemaker), glaucoma, BPV.  The patient was sent to the hospital for admission due to abnormal labs drawn by her primary care provider.  Most notably, she had an abnormal BUN and creatinine.  On admission, labs showed a BUN of 50, creatinine 4.46, total protein 10.7, albumin 3.0 calcium 10.8 (corrected calcium 11.2).  Additionally, she was found to have anemia with a hemoglobin of 8.6.  Ferritin is normal.  Stool for occult blood was negative.  Urinalysis showed a protein of 30 mg/dL.  The patient was admitted and started on IV hydration.  Nephrology has been consulted.  As part of her work-up, serum protein electrophoresis and kappa and lambda light chains have been checked.  SPEP is significant for an elevated M spike at 4.1 and she was found to have elevated kappa free light chains 9676.6 and elevated kappa, lambda light chain ratio at 1209.58.  The patient states that she was seen by her primary care provider for symptoms including worsening fatigue, decreased appetite, weight loss of about 10 pounds, and nausea.  She states that her fatigue has been going on for at least a month.  Her decreased appetite and nausea have been present for about a week.  The patient reports that she has headaches which are not different than her baseline.  She denies having any fevers at home but has developed a fever this morning up to 100.2.  She reports having intermittent night sweats for the past several years. She  has intermittent dizziness but also has a diagnosis of vertigo.  She denied having any chest discomfort or shortness of breath prior to admission but is reporting the symptoms this morning.  She had a productive cough prior to admission.  She reports ongoing nausea but no vomiting.  She had severe constipation, resolved with laxatives.  Denies epistaxis, hemoptysis, hematuria, melena, hematochezia.  Reports decreased urine output.  Hematology was asked see the patient to make recommendations regarding her elevated M spike and light chains. Looking back, she has been complaining of diffuse back pain for many months.  She underwent CT myelogram around September 2019 and have underwent several injections to her spine without much improvement.  At the time of evaluation, she has been moved to the intensive care unit due to elevated troponin and chest pressure.  She has received several hours of nitroglycerin drip when I assess her around 230PM.  She reported symptomatic improvement of her chest pressure. I have also spoken with HER-2 daughters over the telephone and collaborated the history with them She has severe peripheral neuropathy, painful for several years of which she takes Lyrica  Past Medical History:  Diagnosis Date  . Benign paroxysmal positional vertigo 04/06/2010  . Benign positional vertigo   . 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 CAROTID BRUIT 02/24/2008  . DIABETES MELLITUS, TYPE II 02/24/2008  . Glaucoma   . HYPERTENSION 02/24/2008  . Obstructive sleep apnea   . Sick sinus syndrome [facility])   :    Past Surgical History:  Procedure Laterality Date  .  ABDOMINAL HYSTERECTOMY     1980's  . BACK SURGERY     x 3  . EP IMPLANTABLE DEVICE N/A 05/16/2016   Procedure: PPM Generator Changeout;  Surgeon: James Allred, MD;  Location: [facility] IN[facility]AS[facility];  Service: Cardiovascular;  Laterality: N/A;  . KNEE SURGERY Right    2003  . KNEE SURGERY Left    2011  . PACEMAKER INSERTION    :    CURRENT MEDS: Current Facility-Administered Medications  Medication Dose Route Frequency Provider Last Rate Last Dose  . 0.9 %  sodium chloride infusion   Intravenous Continuous Lama, Gagan S, MD 10 mL/hr at 02/28/19 0900    . acetaminophen (TYLENOL) tablet 650 mg  650 mg Oral Q6H PRN Mathews, Elizabeth G, MD      . allopurinol (ZYLOPRIM) tablet 100 mg  100 mg Oral Daily Mathews, Elizabeth G, MD   100 mg at 02/27/19 0950  . amLODipine (NORVASC) tablet 10 mg  10 mg Oral Daily Mathews, Elizabeth G, MD   10 mg at 02/27/19 0950  . bisacodyl (DULCOLAX) suppository 10 mg  10 mg Rectal Once Lama, Gagan S, MD      . dorzolamide (TRUSOPT) 2 % ophthalmic solution 1 drop  1 drop Left Eye QPM Mathews, Elizabeth G, MD   1 drop at 02/27/19 1720  . fentaNYL (SUBLIMAZE) injection 50 mcg  50 mcg Intravenous Q2H PRN Lama, Gagan S, MD   50 mcg at 02/28/19 1057  . fluorometholone (FML) 0.1 % ophthalmic suspension 1 drop  1 drop Right Eye TID Mathews, Elizabeth G, MD   1 drop at 02/28/19 0956  . heparin injection 5,000 Units  5,000 Units Subcutaneous Q8H Mathews, Elizabeth G, MD   5,000 Units at 02/28/19 0505  . hydrALAZINE (APRESOLINE) injection 10 mg  10 mg Intravenous Q6H PRN Mathews, Elizabeth G, MD      . insulin aspart (novoLOG) injection 0-9 Units  0-9 Units Subcutaneous TID WC Mathews, Elizabeth G, MD      . latanoprost (XALATAN) 0.005 % ophthalmic solution 1 drop  1 drop Left Eye QHS Mathews, Elizabeth G, MD   1 drop at 02/27/19 2235  . nitroGLYCERIN (NITROSTAT) 0.4 MG SL tablet           . nitroGLYCERIN 50 mg in dextrose 5 % 250 mL (0.2 mg/mL) infusion  0-200 mcg/min Intravenous Titrated Lama, Gagan S, MD      . ondansetron (ZOFRAN) injection 4 mg  4 mg Intravenous Q6H PRN Kirby-Graham, Karen J, NP   4 mg at 02/28/19 1009  . polyethylene glycol (MIRALAX / GLYCOLAX) packet 17 g  17 g Oral Daily PRN Bodenheimer, Charles A, NP   17 g at 02/27/19 2244  . pregabalin (LYRICA) capsule 50 mg  50 mg Oral Daily  Mathews, Elizabeth G, MD   50 mg at 02/27/19 2228      Allergies  Allergen Reactions  . Aspirin Other (See Comments)    REACTION: STOMACH ISSUES WITH DOSE HIGHER THAN 81 MG   . Penicillins Rash    Patient took injection and tablets, had a reaction. She has taken amoxicillin with no reaction Has patient had a PCN reaction causing immediate rash, facial/tongue/throat swelling, SOB or lightheadedness with hypotension: Yes Has patient had a PCN reaction causing severe rash involving mucus membranes or skin necrosis: Yes Has patient had a PCN reaction that required hospitalization No Has patient had a PCN reaction occurring within the last 10 years: No If all of the   above answers are "NO", then m  . Brimonidine Itching  . Diltiazem Hcl Rash  . Hydrocodone-Acetaminophen Nausea And Vomiting  . Morphine Nausea And Vomiting  . Neurontin [Gabapentin] Nausea And Vomiting  :  Family History  Problem Relation Age of Onset  . Congestive Heart Failure Mother   . Tuberculosis Mother   . Multiple myeloma Mother   . Bone cancer Father   . Arthritis Father   . Breast cancer Sister   . Colon cancer Sister   . Hypertension Sister   . Dementia Sister   . Alcohol abuse Son   :  Social History   Socioeconomic History  . Marital status: Married    Spouse name: Not on file  . Number of children: 5  . Years of education: Not on file  . Highest education level: Not on file  Occupational History  . Occupation: retired  Scientific laboratory technician  . Financial resource strain: Not on file  . Food insecurity:    Worry: Not on file    Inability: Not on file  . Transportation needs:    Medical: Not on file    Non-medical: Not on file  Tobacco Use  . Smoking status: Never Smoker  . Smokeless tobacco: Never Used  Substance and Sexual Activity  . Alcohol use: No  . Drug use: No  . Sexual activity: Not on file  Lifestyle  . Physical activity:    Days per week: Not on file    Minutes per session: Not on  file  . Stress: Not on file  Relationships  . Social connections:    Talks on phone: Not on file    Gets together: Not on file    Attends religious service: Not on file    Active member of club or organization: Not on file    Attends meetings of clubs or organizations: Not on file    Relationship status: Not on file  . Intimate partner violence:    Fear of current or ex partner: Not on file    Emotionally abused: Not on file    Physically abused: Not on file    Forced sexual activity: Not on file  Other Topics Concern  . Not on file  Social History Narrative   Retired Marine scientist  :  REVIEW OF SYSTEMS:  The rest of the 14-point review of systems was negative unless as stated above  Exam: Patient Vitals for the past 24 hrs:  BP Temp Temp src Pulse Resp SpO2  02/28/19 1042 (!) 162/53 - - 70 - 94 %  02/28/19 0957 (!) 181/53 - - 69 (!) 24 96 %  02/28/19 0910 (!) 166/53 100.2 F (37.9 C) Oral 73 (!) 24 95 %  02/28/19 0437 (!) 164/67 99.8 F (37.7 C) Oral 70 18 90 %  02/27/19 2157 (!) 144/62 99.1 F (37.3 C) Oral 70 18 100 %  02/27/19 1219 (!) 141/63 98.4 F (36.9 C) Oral 69 18 97 %    General: Well-nourished, somnolent this morning but able to answer questions.   Eyes:  no scleral icterus.   ENT:  There were no oropharyngeal lesions.   Neck was without thyromegaly.   Lymphatics:  Negative cervical, supraclavicular or axillary adenopathy.   Respiratory: lungs were clear bilaterally without wheezing or crackles.   Cardiovascular:  Regular rate and rhythm, S1/S2, without murmur, rub or gallop.  There was no pedal edema.  GI:  abdomen was soft, flat, nontender, nondistended, without organomegaly.   Musculoskeletal:  no spinal tenderness of palpation of vertebral spine.   Skin exam was without ecchymosis, petechiae.   Neuro exam was nonfocal. Patient was alert and oriented.  Attention was good.   Language was appropriate.  Mood was normal without depression.  Speech was not pressured.   Thought content was not tangential.    LABS:  Lab Results  Component Value Date   WBC 5.0 02/28/2019   HGB 7.7 (L) 02/28/2019   HCT 24.3 (L) 02/28/2019   PLT 164 02/28/2019   GLUCOSE 91 02/28/2019   CHOL 108 02/26/2019   TRIG 145 02/26/2019   HDL 20 (L) 02/26/2019   LDLCALC 59 02/26/2019   ALT 13 02/26/2019   AST 18 02/26/2019   NA 139 02/28/2019   K 3.8 02/28/2019   CL 108 02/28/2019   CREATININE 4.03 (H) 02/28/2019   BUN 42 (H) 02/28/2019   CO2 22 02/28/2019   INR 1.57 (H) 01/03/2010   HGBA1C 5.5 02/26/2019   Serum protein electrophoresis showed a 4.1 g/dL M spike, kappa free light chain 9676.6, kappa, lambda light chain ratio 1209.58  Dg Chest 1 View  Result Date: 02/25/2019 CLINICAL DATA:  Acute kidney injury and cough. EXAM: CHEST  1 VIEW COMPARISON:  None. FINDINGS: Left chest wall pacer device is noted with leads in the right atrial appendage and right ventricle. Mild cardiac enlargement. Aortic atherosclerosis. No pleural effusion or edema. No airspace opacities. IMPRESSION: 1. Cardiac enlargement.  No acute findings. 2.  Aortic Atherosclerosis (ICD10-I70.0). Electronically Signed   By: Taylor  Stroud M.D.   On: 02/25/2019 13:51   Us Renal  Result Date: 02/25/2019 CLINICAL DATA:  88-year-old female with acute renal failure EXAM: RENAL / URINARY TRACT ULTRASOUND COMPLETE COMPARISON:  None. FINDINGS: Right Kidney: Renal measurements: 11.9 x 4.1 x 4.3 cm = volume: 111 mL . Echogenicity within normal limits. No mass or hydronephrosis visualized. Left Kidney: Renal measurements: 12.5 x 4.2 x 4.2 cm = volume: 115 mL. Two renal cysts are present measuring 1.2 cm. Echogenicity within normal limits. No mass or hydronephrosis visualized. Bladder: Appears normal for degree of bladder distention, but bladder not well distended. IMPRESSION: 1. Unremarkable kidneys except for 2 small LEFT renal cysts. No evidence of hydronephrosis. Electronically Signed   By: Jeffrey  Hu M.D.   On:  02/25/2019 16:00    ASSESSMENT AND PLAN:  #1 Elevated SPEP and light chains consistent with multiple myeloma  Her current work-up is incomplete When she is stable, I would like to order skeletal survey to rule out impeding bone fractures I will also order immunoglobulin levels tomorrow to find out the heavy chain that is associated with a kappa light chain disease Ideally, I would like to order a bone marrow aspirate and biopsy Due to cardiovascular instability, we will delay bone marrow biopsy  I recommend short-term corticosteroid therapy to stabilize her multiple myeloma and to treat her hypercalcemia I discussed the risk and benefits of dexamethasone and she agreed to proceed I have prescribed 8 mg IV dexamethasone daily for 4 days She is aware that it will exacerbate hyperglycemia and cause poor sleep  #2 Anemia Likely due to her underlying bone marrow disorder She does not need transfusion support at this point Monitor closely  #3 Malignant hypercalcemia Her calculated serum calcium is very high Hopefully, with high-dose dexamethasone, that would also treat her severe hyperglycemia I will prescribe aggressive fluid hydration once her cardiac symptoms stabilized  #4 Acute renal failure She has acute on chronic renal failure   due to undiagnosed multiple myeloma I am hopeful that high-dose corticosteroid therapy might improve her situation Appreciate nephrology follow-up I will check serum uric acid level tomorrow and if is elevated, will start her on rasburicase Will monitor for tumor lysis syndrome tomorrow  #5 Chest pain with elevated troponin Her EKG is not interpretable due to the presence of paced rhythm Troponin is elevated but that could be elevated in the setting of renal failure She felt better with nitroglycerin drip Continue for now Her chest pain could be triggered by hyperviscosity related to untreated multiple myeloma.  #6 Severe bone pain Likely due to her  underlying bone marrow disorder and hypercalcemia Hopefully, the steroids will improve this  #7 constipation, resolved Likely due to malignant hypercalcemia Continue aggressive laxative therapy  I have extensive discussion with the patient and caregivers and primary service is updated I will return to check on her tomorrow Once she is stable, will order all the necessary tests for final diagnosis She would benefit from systemic chemotherapy once she is out of the ICU   Mikey Bussing, DNP, AGPCNP-BC, AOCNP Heath Lark, MD

## 2019-02-28 NOTE — Progress Notes (Signed)
CRITICAL VALUE ALERT  Critical Value:  TROPONIN 0.12  Date & Time Notied:  02/28/19 @ 1054  Provider Notified: LAMA  Orders Received/Actions taken: PROVIDER TO ENTER

## 2019-02-28 NOTE — Evaluation (Signed)
Clinical/Bedside Swallow Evaluation Patient Details  Name: [name] MRN: [id number] Date of Birth: November 16, 1929  Today's Date: 02/28/2019 Time: SLP Start Time (ACUTE ONLY): 1320 SLP Stop Time (ACUTE ONLY): 1350 SLP Time Calculation (min) (ACUTE ONLY): 30 min  Past Medical History:  Past Medical History:  Diagnosis Date  . Benign paroxysmal positional vertigo 04/06/2010  . Benign positional vertigo   . 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 CAROTID BRUIT 02/24/2008  . DIABETES MELLITUS, TYPE II 02/24/2008  . Glaucoma   . HYPERTENSION 02/24/2008  . Obstructive sleep apnea   . Sick sinus syndrome [facility])    Past Surgical History:  Past Surgical History:  Procedure Laterality Date  . ABDOMINAL HYSTERECTOMY     1980's  . BACK SURGERY     x 3  . EP IMPLANTABLE DEVICE N/A 05/16/2016   Procedure: PPM Generator Changeout;  Surgeon: Thompson Grayer, MD;  Location: [place] CV LAB;  Service: Cardiovascular;  Laterality: N/A;  . KNEE SURGERY Right    2003  . KNEE SURGERY Left    2011  . PACEMAKER INSERTION     HPI:  85 yo female adm to [facility] with fatigue, nausea, and weakness - AKI.  Pt has h/o DM, neuropathy, anemia, OSA, carotid bruit, sick sinus syndrome - bradycardia.  Per oncology notes, concern is present for possible multiple myeloma.  Pt COVD negative.  CXR concerning for possible pna.  Swallow eval ordered as pt reports problems swallowing.  She was transferred to ICU from floor due to troponins,    Assessment / Plan / Recommendation Clinical Impression  Pt presents with functional oropharyngeal swallow function.  No overt indication of aspiration of with po intake.  Pt does report issues with needing liquids to help orally transit dry solids.  No focal RN deficits noted - except pt has h/o shingles which has impacted her right eye opening.  She does report problems with reflux and had taken carafate 1 mL and protonix 40 mg.  Currently she is not taking any medications for refluxing.    SLP Visit Diagnosis: Dysphagia, unspecified (R13.10)    Aspiration Risk  Mild aspiration risk    Diet Recommendation Regular;Thin liquid   Liquid Administration via: Cup;Straw Medication Administration: Whole meds with liquid Supervision: Patient able to self feed Compensations: Slow rate;Small sips/bites Postural Changes: Seated upright at 90 degrees;Remain upright for at least 30 minutes after po intake    Other  Recommendations Oral Care Recommendations: Oral care BID   Follow up Recommendations None      Frequency and Duration            Prognosis Prognosis for Safe Diet Advancement: Guarded      Swallow Study   General Date of Onset: 02/28/19 HPI: 85 yo female adm to [facility] with fatigue, nausea, and weakness - AKI.  Pt has h/o DM, neuropathy, anemia, OSA, carotid bruit, sick sinus syndrome - bradycardia.  Per oncology notes, concern is present for possible multiple myeloma.  Pt COVD negative.  CXR concerning for possible pna.  Swallow eval ordered as pt reports problems swallowing.  She was transferred to ICU from floor due to troponins,  Type of Study: Bedside Swallow Evaluation Previous Swallow Assessment: none in epic Diet Prior to this Study: Thin liquids;Regular Temperature Spikes Noted: No Respiratory Status: Room air History of Recent Intubation: No Behavior/Cognition: Cooperative;Pleasant mood Oral Cavity Assessment: Within Functional Limits Oral Cavity - Dentition: Adequate natural dentition Vision: Functional for self-feeding Self-Feeding Abilities: Able  to feed self Patient Positioning: Upright in bed Baseline Vocal Quality: Low vocal intensity Volitional Cough: Strong Volitional Swallow: Able to elicit    Oral/Motor/Sensory Function Overall Oral Motor/Sensory Function: Within functional limits   Ice Chips Ice chips: Not tested   Thin Liquid Thin Liquid: Within functional limits Presentation: Self Fed;Straw    Nectar Thick Nectar Thick Liquid: Not  tested   Honey Thick Honey Thick Liquid: Not tested   Puree Puree: Within functional limits Presentation: Self Fed;Spoon   Solid     Solid: Within functional limits Presentation: Self Fed Other Comments: pt used gingerale to help moisten food and clear oral residuals -       Macario Golds 02/28/2019,2:28 PM    Luanna Salk, [place] [facility] SLP [place] Pager [phone] Office [phone]

## 2019-02-28 NOTE — Progress Notes (Addendum)
[facility]  PROGRESS NOTE  [name] SHF:[id number] DOB: 1930-07-29 DOA: 02/25/2019 PCP: Lucianne Lei, MD   Brief HPI:   85 year old female with a history of hypertension, diabetes mellitus, bradycardia, status post pacemaker placement came to hospital with abnormal labs and fatigue.  Patient was seen by her PCP had labs drawn which showed that renal function was abnormal.  Patient has been taking Motrin off and on for past 1 month for chronic back pain.  Patient has HCTZ mentioning med rec list but has not been taking that for quite a while.  She does take Micardis for hypertension.  Patient baseline creatinine was around 1.0 per labs shown in epic in 2017.  COVID-19 test is negative    Subjective   Patient seen and examined, developed dyspnea this morning also complained of chest pressure.  SPEP showed M spike with elevated urinary kappa light chains, in the setting of hypercalcemia and acute kidney injury with hyperproteinemia concern for multiple myeloma.     Assessment/Plan:     1. Acute kidney injury-patient presented with creatinine of 4.46 on admission, last creatinine was 1.0 and 2017.  Likely multifactorial from Motrin use, poor p.o. intake, ARB .  Renal ultrasound obtained showed no hydronephrosis. Started on IV normal saline.  Nephrology following.  Creatinine today has  improved to 4.03.   2. Acute pulmonary edema-patient devloped pulmonary edema this morning likely from fluid overload, which patient received for acute kidney injury.  Will start patient on nitroglycerin infusion.  Transfer to stepdown for closer monitoring.  Fentanyl 50 mcg every 2 hours as needed for dyspnea.  Will avoid morphine due to worsening renal function.  Patient has ST depressions in lead I , V5 V6.  Mild elevation of troponin  0.12.  Will consult cardiology for further recommendations regarding her management.  3. Multiple myeloma-new diagnosis, patient presente agenic INR female d with AKI,  hypercalcemia, hypoproteinemia.  SPEP labs showed M spike protein with 4.1 g/dL.  Urinary kappa free light chain 9676.  Kappa lambda light chain ratio 1209.58.  Called and discussed with oncologist, patient most likely has multiple myeloma.  No bone marrow biopsy recommended at this time due to COVID-19.  Oncologist will see patient today and make recommendations.  4. Hypercalcemia-today calcium is 10.0, corrected calcium is 11.4.  It has improved after starting on IV hydration with normal saline.  IV fluids are currently hold due to development of pulmonary edema as above.  5. Hypertension-blood pressure well controlled on Norvasc 10 mg daily, Micardis 80 mg daily.  She does not take HCTZ.  Will hold Micardis due to worsening renal function.  Continue amlodipine.  6. Diabetes mellitus type 2-continue sliding scale insulin with NovoLog.  7. Gout-stable, continue allopurinol.  8. 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  9. Anemia of chronic disease-patient's hemoglobin is 7.7, likely from underlying renal disease.  FOBT obtained during this hospitalization is negative.  10. Hypokalemia-replete     CBG: Recent Labs  Lab 02/27/19 1611 02/27/19 2159 02/28/19 0746 02/28/19 1151 02/28/19 1242  GLUCAP 72 103* 92 106* 109*    CBC: Recent Labs  Lab 02/25/19 1103 02/25/19 1511 02/26/19 0353 02/28/19 0443  WBC 4.4 5.3 4.7 5.0  NEUTROABS 2.5  --   --   --   HGB 8.6* 8.3* 7.8* 7.7*  HCT 26.4* 25.7* 25.6* 24.3*  MCV 91.0 90.5 93.4 93.5  PLT 181 191 184 502    Basic Metabolic Panel: Recent Labs  Lab 02/25/19 1103 02/26/19 0353 02/27/19 0438 02/28/19 0443  NA 137 138 139 139  K 3.1* 3.5 3.2* 3.8  CL 101 104 108 108  CO2 _0 GLUCOSE 127* 88 84 91  BUN 50* 45* 43* 42*  CREATININE 4.46* 4.21* 4.11* 4.03*  CALCIUM 10.8* 10.2 9.6 10.0  MG 2.3  --   --   --   PHOS  --   --   --  3.7     DVT prophylaxis: Heparin  Code Status: Full  code  Family Communication: No family at bedside  Disposition Plan: likely home when medically ready for discharge     Consultants:  None  Procedures:  None   Antibiotics:   Anti-infectives (From admission, onward)   None       Objective   Vitals:   02/28/19 0910 02/28/19 0957 02/28/19 1042 02/28/19 1244  BP: (!) 166/53 (!) 181/53 (!) 162/53 (!) 183/77  Pulse: 73 69 70 70  Resp: (!) 24 (!) 24    Temp: 100.2 F (37.9 C)   98.9 F (37.2 C)  TempSrc: Oral   Oral  SpO2: 95% 96% 94% 98%  Weight:      Height:        Intake/Output Summary (Last 24 hours) at 02/28/2019 1352 Last data filed at 02/28/2019 1213 Gross per 24 hour  Intake 1740.11 ml  Output 1850 ml  Net -109.89 ml   Filed Weights   02/25/19 1012  Weight: 81.2 kg     Physical Examination:   General:  Appears in mild resp distress  Cardiovascular: S1-S2, regular  Respiratory: Bibasilar crackles auscultated  Abdomen: Abdomen is soft, nontender, no organomegaly  Extremities: No edema in the lower extremities  Neurologic: Alert, oriented x3, no focal deficit noted.   Data Reviewed: I have personally reviewed following labs and imaging studies   Recent Results (from the past 240 hour(s))  SARS Coronavirus 2 (CEPHEID - Performed in [facility] hospital lab), Hosp Order     Status: None   Collection Time: 02/25/19 11:58 AM  Result Value Ref Range Status   SARS Coronavirus 2 NEGATIVE NEGATIVE Final    Comment: (NOTE) If result is NEGATIVE SARS-CoV-2 target nucleic acids are NOT DETECTED. The SARS-CoV-2 RNA is generally detectable in upper and lower  respiratory specimens during the acute phase of infection. The lowest  concentration of SARS-CoV-2 viral copies this assay can detect is 250  copies / mL. A negative result does not preclude SARS-CoV-2 infection  and should not be used as the sole basis for treatment or other  patient management decisions.  A negative result may occur with   improper specimen collection / handling, submission of specimen other  than nasopharyngeal swab, presence of viral mutation(s) within the  areas targeted by this assay, and inadequate number of viral copies  (<250 copies / mL). A negative result must be combined with clinical  observations, patient history, and epidemiological information. If result is POSITIVE SARS-CoV-2 target nucleic acids are DETECTED. The SARS-CoV-2 RNA is generally detectable in upper and lower  respiratory specimens dur ing the acute phase of infection.  Positive  results are indicative of active infection with SARS-CoV-2.  Clinical  correlation with patient history and other diagnostic information is  necessary to determine patient infection status.  Positive results do  not rule out bacterial infection or co-infection with other viruses. If result is PRESUMPTIVE POSTIVE SARS-CoV-2 nucleic acids MAY BE PRESENT.   A presumptive  positive result was obtained on the submitted specimen  and confirmed on repeat testing.  While 2019 novel coronavirus  (SARS-CoV-2) nucleic acids may be present in the submitted sample  additional confirmatory testing may be necessary for epidemiological  and / or clinical management purposes  to differentiate between  SARS-CoV-2 and other Sarbecovirus currently known to infect humans.  If clinically indicated additional testing with an alternate test  methodology [id number]) is advised. The SARS-CoV-2 RNA is generally  detectable in upper and lower respiratory sp ecimens during the acute  phase of infection. The expected result is Negative. Fact Sheet for Patients:  StrictlyIdeas.no Fact Sheet for Healthcare Providers: BankingDealers.co.za This test is not yet approved or cleared by the [place] FDA and has been authorized for detection and/or diagnosis of SARS-CoV-2 by FDA under an Emergency Use Authorization (EUA).  This EUA will  remain in effect (meaning this test can be used) for the duration of the COVID-19 declaration under Section 564(b)(1) of the Act, 21 U.S.C. section 360bbb-3(b)(1), unless the authorization is terminated or revoked sooner. Performed at [facility], [place] [address], [place], [place] [address]   Culture, Urine     Status: Abnormal   Collection Time: 02/26/19  7:02 PM  Result Value Ref Range Status   Specimen Description   Final    URINE, CLEAN CATCH Performed at [facility], [place] [address], [place], [place] [address]    Special Requests   Final    NONE Performed at [facility], [place] [address] [place], [place] [address]    Culture (A)  Final    50,000 COLONIES/mL LACTOBACILLUS SPECIES Standardized susceptibility testing for this organism is not available. Performed at [facility], [place] [address] [place], [place] [address]    Report Status 02/27/2019 FINAL  Final     Liver Function Tests: Recent Labs  Lab 02/25/19 1103 02/26/19 0353 02/28/19 0443  AST 18 18  --   ALT 12 13  --   ALKPHOS 30* 26*  --   BILITOT 0.8 0.3  --   PROT 10.7* 9.4*  --   ALBUMIN 3.0* 2.5* 2.5*   Recent Labs  Lab 02/25/19 1103  LIPASE 26      Studies: Dg Chest Port 1 View  Result Date: 02/28/2019 CLINICAL DATA:  Dyspnea. EXAM: PORTABLE CHEST 1 VIEW COMPARISON:  Radiograph of Feb 25, 2019. FINDINGS: Stable cardiomegaly. Atherosclerosis of thoracic aorta is noted. Left-sided pacemaker is unchanged in position. No pneumothorax or pleural effusion is noted. Left lung is clear. Mildly increased right lung opacities are noted concerning for either asymmetric edema or possibly pneumonia. Severe degenerative changes seen involving the right glenohumeral joint. IMPRESSION: Mildly increased right lung opacities are noted concerning for possible asymmetric edema or possibly pneumonia. Aortic Atherosclerosis (ICD10-I70.0). Electronically Signed    By: Marijo Conception M.D.   On: 02/28/2019 12:26    Scheduled Meds: . allopurinol  100 mg Oral Daily  . amLODipine  10 mg Oral Daily  . bisacodyl  10 mg Rectal Once  . dorzolamide  1 drop Left Eye QPM  . fluorometholone  1 drop Right Eye TID  . heparin  5,000 Units Subcutaneous Q8H  . insulin aspart  0-9 Units Subcutaneous TID WC  . latanoprost  1 drop Left Eye QHS  . nitroGLYCERIN      . pregabalin  50 mg Oral Daily    Admission status: Inpatient: Based on patients clinical presentation and  evaluation of above clinical data, I have made determination that patient meets Inpatient criteria at this time.  Time spent: 25 min  [facility] Pager [phone]. If 7PM-7AM, please contact night-coverage at [email or web address], Office  [phone]  password [place]  02/28/2019, 1:52 PM  LOS: 2 days

## 2019-02-28 NOTE — Evaluation (Signed)
SLP Cancellation Note  Patient Details Name: [name] MRN: [id number] DOB: Oct 03, 1930   Cancelled treatment:       Reason Eval/Treat Not Completed: Other (comment)(slp attempted bse this am, pt was needing commode, assisted to bsc and left with nurse tech, will continue efforts) Luanna Salk, [facility] SLP Acute Rehab Services Pager [phone] Office [phone]  Macario Golds 02/28/2019, 12:46 PM

## 2019-02-28 NOTE — Progress Notes (Signed)
Subjective:  1850 of UOP...Marland Kitchen. crt down a little again.  She is a little more somnolent today - reports nausea, possibly related to pain meds  - SPEP positive, troponin slightly positive as well    Objective Vital signs in last 24 hours: Vitals:   02/28/19 0437 02/28/19 0910 02/28/19 0957 02/28/19 1042  BP: (!) 164/67 (!) 166/53 (!) 181/53 (!) 162/53  Pulse: 70 73 69 70  Resp: 18 (!) 24 (!) 24   Temp: 99.8 F (37.7 C) 100.2 F (37.9 C)    TempSrc: Oral Oral    SpO2: 90% 95% 96% 94%  Weight:      Height:       Weight change:   Intake/Output Summary (Last 24 hours) at 02/28/2019 1132 Last data filed at 02/28/2019 0700 Gross per 24 hour  Intake 1012.5 ml  Output 1450 ml  Net -437.5 ml    Assessment/ Plan: Pt is a 85 y.o. yo female with DM, HTN, unknown baseline renal status (crt 1.0 to 1.3 in 2017 ) who was admitted on 02/25/2019 with AKI (vs chronic)  in the setting of multiple recent renal insults (NSAIDS/ARB)  Assessment/Plan: 1. AKI- recent NSAIDS and ARB. urinalysis pretty bland- 6-10 WBC but with trace leuks and neg nitrite, urine culture not remarkable for treatment.  Renal ultrasound pretty unremarkable.  Protein to crt ratio 1.8-  30 on the dip.  SPEP now appears positive in the setting of anemia, hypercalcemia and hyperproteinemia.  Good UOP and crt trending down albeit slowly  - no indications for dialysis  2. Hypercalcemia - improving with IVF- SPEP and other labs pending  - no exogenous calcium  3. Anemia- worsening with IVF- iron stores low , will replete and gave one time dose of darbe - SPEP positive  4. HTN/volume- BP reasonable on norvasc 10 and PRN hydralazine - decreased IVF-- now saline lock  5. Hypokalemia- repleted yest- better  6.  SPEP positive- kappa free light chain, very abnormal- heme/onc has been consulted    [name]    Labs: Basic Metabolic Panel: Recent Labs  Lab 02/26/19 0353 02/27/19 0438 02/28/19 0443  NA 138 139 139  K 3.5  3.2* 3.8  CL 104 108 108  CO2 23 22 22   GLUCOSE 88 84 91  BUN 45* 43* 42*  CREATININE 4.21* 4.11* 4.03*  CALCIUM 10.2 9.6 10.0  PHOS  --   --  3.7   Liver Function Tests: Recent Labs  Lab 02/25/19 1103 02/26/19 0353 02/28/19 0443  AST 18 18  --   ALT 12 13  --   ALKPHOS 30* 26*  --   BILITOT 0.8 0.3  --   PROT 10.7* 9.4*  --   ALBUMIN 3.0* 2.5* 2.5*   Recent Labs  Lab 02/25/19 1103  LIPASE 26   No results for input(s): AMMONIA in the last 168 hours. CBC: Recent Labs  Lab 02/25/19 1103 02/25/19 1511 02/26/19 0353 02/28/19 0443  WBC 4.4 5.3 4.7 5.0  NEUTROABS 2.5  --   --   --   HGB 8.6* 8.3* 7.8* 7.7*  HCT 26.4* 25.7* 25.6* 24.3*  MCV 91.0 90.5 93.4 93.5  PLT 181 191 184 164   Cardiac Enzymes: Recent Labs  Lab 02/28/19 1000  TROPONINI 0.12*   CBG: Recent Labs  Lab 02/27/19 0724 02/27/19 1147 02/27/19 1611 02/27/19 2159 02/28/19 0746  GLUCAP 76 99 72 103* 92    Iron Studies:  Recent Labs    02/28/19 0443  IRON 61  TIBC 215*  FERRITIN 180   Studies/Results: No results found. Medications: Infusions: . sodium chloride 10 mL/hr at 02/28/19 0900  . nitroGLYCERIN      Scheduled Medications: . allopurinol  100 mg Oral Daily  . amLODipine  10 mg Oral Daily  . bisacodyl  10 mg Rectal Once  . dorzolamide  1 drop Left Eye QPM  . fluorometholone  1 drop Right Eye TID  . heparin  5,000 Units Subcutaneous Q8H  . insulin aspart  0-9 Units Subcutaneous TID WC  . latanoprost  1 drop Left Eye QHS  . nitroGLYCERIN      . pregabalin  50 mg Oral Daily    have reviewed scheduled and prn medications.  Physical Exam: General: asleep - less arousable than yesterday  Heart: RRR Lungs: mostly clear- intermittent coughing  Abdomen: soft, non tender Extremities: no edema     02/28/2019,11:32 AM  LOS: 2 days

## 2019-02-28 NOTE — Progress Notes (Signed)
Pt. Right eye remains closed pt. Reports it is from shingles. RN assessed pt. And did a modified NIH scale the pt. Tested negative for all stroke symptoms.

## 2019-02-28 NOTE — Progress Notes (Signed)
This am patient complained of feeling tired, SOB with pressure in chest described as weight. VS assessed 100.2 , 73, 24, 166/53, 95% 2lt.

## 2019-03-01 ENCOUNTER — Inpatient Hospital Stay (HOSPITAL_COMMUNITY): Payer: Medicare HMO

## 2019-03-01 DIAGNOSIS — I340 Nonrheumatic mitral (valve) insufficiency: Secondary | ICD-10-CM

## 2019-03-01 DIAGNOSIS — J810 Acute pulmonary edema: Secondary | ICD-10-CM

## 2019-03-01 DIAGNOSIS — C9000 Multiple myeloma not having achieved remission: Principal | ICD-10-CM

## 2019-03-01 DIAGNOSIS — I361 Nonrheumatic tricuspid (valve) insufficiency: Secondary | ICD-10-CM

## 2019-03-01 DIAGNOSIS — Z950 Presence of cardiac pacemaker: Secondary | ICD-10-CM

## 2019-03-01 LAB — RENAL FUNCTION PANEL
Albumin: 2.4 g/dL — ABNORMAL LOW (ref 3.5–5.0)
Anion gap: 12 (ref 5–15)
BUN: 43 mg/dL — ABNORMAL HIGH (ref 8–23)
CO2: 20 mmol/L — ABNORMAL LOW (ref 22–32)
Calcium: 9.9 mg/dL (ref 8.9–10.3)
Chloride: 105 mmol/L (ref 98–111)
Creatinine, Ser: 3.97 mg/dL — ABNORMAL HIGH (ref 0.44–1.00)
GFR calc Af Amer: 11 mL/min — ABNORMAL LOW (ref >60)
GFR calc non Af Amer: 10 mL/min — ABNORMAL LOW (ref >60)
Glucose, Bld: 158 mg/dL — ABNORMAL HIGH (ref 70–99)
Phosphorus: 4.9 mg/dL — ABNORMAL HIGH (ref 2.5–4.6)
Potassium: 4.1 mmol/L (ref 3.5–5.1)
Sodium: 137 mmol/L (ref 135–145)

## 2019-03-01 LAB — ECHOCARDIOGRAM LIMITED
Height: 65 in
Weight: 2864 oz

## 2019-03-01 LAB — GLUCOSE, CAPILLARY
Glucose-Capillary: 113 mg/dL — ABNORMAL HIGH (ref 70–99)
Glucose-Capillary: 239 mg/dL — ABNORMAL HIGH (ref 70–99)
Glucose-Capillary: 135 mg/dL — ABNORMAL HIGH (ref 70–99)
Glucose-Capillary: 161 mg/dL — ABNORMAL HIGH (ref 70–99)

## 2019-03-01 LAB — URIC ACID: Uric Acid, Serum: 7 mg/dL (ref 2.5–7.1)

## 2019-03-01 LAB — TROPONIN I: Troponin I: 0.2 ng/mL (ref <0.03)

## 2019-03-01 MED ORDER — DEXAMETHASONE SODIUM PHOSPHATE 4 MG/ML IJ SOLN
12.0000 mg | Freq: Every day | INTRAMUSCULAR | Status: AC
  Administered 2019-03-01 – 2019-03-03 (×3): 12 mg via INTRAVENOUS
  Filled 2019-03-01 (×3): qty 3

## 2019-03-01 MED ORDER — PANTOPRAZOLE SODIUM 40 MG IV SOLR
40.0000 mg | INTRAVENOUS | Status: DC
  Administered 2019-03-01 – 2019-03-03 (×3): 40 mg via INTRAVENOUS
  Filled 2019-03-01 (×3): qty 40

## 2019-03-01 MED ORDER — CHLORHEXIDINE GLUCONATE CLOTH 2 % EX PADS
6.0000 | MEDICATED_PAD | Freq: Every day | CUTANEOUS | Status: DC
  Administered 2019-03-01 – 2019-03-10 (×8): 6 via TOPICAL

## 2019-03-01 MED ORDER — ORAL CARE MOUTH RINSE
15.0000 mL | Freq: Two times a day (BID) | OROMUCOSAL | Status: DC
  Administered 2019-03-01 – 2019-03-11 (×20): 15 mL via OROMUCOSAL

## 2019-03-01 NOTE — Progress Notes (Signed)
CRITICAL VALUE ALERT  Critical Value:  Troponin 0.22  Date & Time Notied:  03/01/2019  1230   Provider Notified: Silas [place], NP   Orders Received/Actions taken: previous troponin 0.15. Patient currently stable on nitro gtt at 62mcg/min. Will continue to closely monitor patient.

## 2019-03-01 NOTE — Progress Notes (Signed)
  Echocardiogram 2D Echocardiogram has been performed.  [name] 03/01/2019, 10:40 AM

## 2019-03-01 NOTE — Progress Notes (Signed)
RN has attempted to call all amion numbers to speak with a cardiologist for this pt. The plan for today was for the pt. To receive an echo a cardiologist to review and hopefully titrate her nitro drip down. RN talked to the few cardiologists on amion and was given a different number to call or told they do not have this pt. RN called all numbers and has not been able to get in contact with the correct cardiologist to let them know that the echocardiogram results are back. RN will continue to administer pt. Nitro throughout the night until specified to do something different.

## 2019-03-01 NOTE — Progress Notes (Signed)
[name]   DOB:12/07/1929   HC#:[id number]    ASSESSMENT & PLAN:  #1 Elevated SPEP and light chains consistent with multiple myeloma  Her current work-up is incomplete When she is stable, I would like to order skeletal survey to rule out impeding bone fractures Immunoglobulin levels are ordered, pending Due to cardiovascular instability, we will delay bone marrow biopsy  I recommend short-term corticosteroid therapy to stabilize her multiple myeloma and to treat her hypercalcemia I discussed the risk and benefits of dexamethasone and she agreed to proceed I have prescribed 8 mg IV dexamethasone yesterday; she tolerated well without significant hyperglycemia. Will increase to 12 mg daily for 3 more days  #2 Anemia Likely due to her underlying bone marrow disorder She does not need transfusion support at this point Monitor closely, recheck tomorrow  #3 Malignant hypercalcemia Her calculated serum calcium is very high Hopefully, with high-dose dexamethasone, that would also treat her severe hyperglycemia I will prescribe aggressive fluid hydration once her cardiac symptoms stabilized  #4 Acute renal failure She has acute on chronic renal failure due to undiagnosed multiple myeloma I am hopeful that high-dose corticosteroid therapy might improve her situation Appreciate nephrology follow-up Uric acid level is ok so far, if trending up tomorrow (>10), will start her on rasburicase Will monitor for tumor lysis syndrome daily Will avoid allopurinol for now  #5 Chest pain with elevated troponin Her EKG is not interpretable due to the presence of paced rhythm Troponin is elevated but that could be elevated in the setting of renal failure She felt better with nitroglycerin drip Continue for now Her chest pain could be triggered by hyperviscosity related to untreated multiple myeloma.  #6 Severe bone pain Likely due to her underlying bone marrow disorder and  hypercalcemia Hopefully, the steroids will improve this  #7 constipation, resolved Likely due to malignant hypercalcemia Continue aggressive laxative therapy  I have updated her children today I will return to check on her tomorrow or Monday Once she is stable, will order all the necessary tests for final diagnosis She would benefit from systemic chemotherapy once she is out of the ICU  All questions were answered. The patient knows to call the clinic with any problems, questions or concerns.   Heath Lark, MD 03/01/2019 8:27 AM  Subjective:  She was sleepy but arousable.  She felt that her chest pain has improved.  She is noted to be afebrile. She denies pain this morning.  Objective:  Vitals:   03/01/19 0700 03/01/19 0800  BP: (!) 131/51   Pulse: 70   Resp: 14   Temp:  98.3 F (36.8 C)  SpO2: 98%      Intake/Output Summary (Last 24 hours) at 03/01/2019 0827 Last data filed at 03/01/2019 0700 Gross per 24 hour  Intake 1132.34 ml  Output 1050 ml  Net 82.34 ml    GENERAL: She was sleepy but arousable.  In no acute distress SKIN: skin color, texture, turgor are normal, no rashes or significant lesions EYES: normal, Conjunctiva are pale and non-injected, sclera clear OROPHARYNX:no exudate, no erythema and lips, buccal mucosa, and tongue normal  NECK: supple, thyroid normal size, non-tender, without nodularity LYMPH:  no palpable lymphadenopathy in the cervical, axillary or inguinal LUNGS: clear to auscultation and percussion with normal breathing effort HEART: regular rate & rhythm and no murmurs and no lower extremity edema ABDOMEN:abdomen soft, non-tender and normal bowel sounds Musculoskeletal:no cyanosis of digits and no clubbing  NEURO: alert & oriented x 3  with fluent speech, no focal motor/sensory deficits   Labs:  Recent Labs    02/25/19 1103 02/26/19 0353 02/27/19 0438 02/28/19 0443 03/01/19 0258  NA 137 138 139 139 137  K 3.1* 3.5 3.2* 3.8 4.1  CL  101 104 108 108 105  CO2 _0 20*  GLUCOSE 127* 88 84 91 158*  BUN 50* 45* 43* 42* 43*  CREATININE 4.46* 4.21* 4.11* 4.03* 3.97*  CALCIUM 10.8* 10.2 9.6 10.0 9.9  GFRNONAA 8* 9* 9* 9* 10*  GFRAA 10* 10* 11* 11* 11*  PROT 10.7* 9.4*  --   --   --   ALBUMIN 3.0* 2.5*  --  2.5* 2.4*  AST 18 18  --   --   --   ALT 12 13  --   --   --   ALKPHOS 30* 26*  --   --   --   BILITOT 0.8 0.3  --   --   --     Studies:  Dg Chest 1 View  Result Date: 02/25/2019 CLINICAL DATA:  Acute kidney injury and cough. EXAM: CHEST  1 VIEW COMPARISON:  None. FINDINGS: Left chest wall pacer device is noted with leads in the right atrial appendage and right ventricle. Mild cardiac enlargement. Aortic atherosclerosis. No pleural effusion or edema. No airspace opacities. IMPRESSION: 1. Cardiac enlargement.  No acute findings. 2.  Aortic Atherosclerosis (ICD10-I70.0). Electronically Signed   By: Kerby Moors M.D.   On: 02/25/2019 13:51   US Renal  Result Date: 02/25/2019 CLINICAL DATA:  85 year old female with acute renal failure EXAM: RENAL / URINARY TRACT ULTRASOUND COMPLETE COMPARISON:  None. FINDINGS: Right Kidney: Renal measurements: 11.9 x 4.1 x 4.3 cm = volume: 111 mL . Echogenicity within normal limits. No mass or hydronephrosis visualized. Left Kidney: Renal measurements: 12.5 x 4.2 x 4.2 cm = volume: 115 mL. Two renal cysts are present measuring 1.2 cm. Echogenicity within normal limits. No mass or hydronephrosis visualized. Bladder: Appears normal for degree of bladder distention, but bladder not well distended. IMPRESSION: 1. Unremarkable kidneys except for 2 small LEFT renal cysts. No evidence of hydronephrosis. Electronically Signed   By: Margarette Canada M.D.   On: 02/25/2019 16:00   Dg Chest Port 1 View  Result Date: 02/28/2019 CLINICAL DATA:  Dyspnea. EXAM: PORTABLE CHEST 1 VIEW COMPARISON:  Radiograph of Feb 25, 2019. FINDINGS: Stable cardiomegaly. Atherosclerosis of thoracic aorta is noted.  Left-sided pacemaker is unchanged in position. No pneumothorax or pleural effusion is noted. Left lung is clear. Mildly increased right lung opacities are noted concerning for either asymmetric edema or possibly pneumonia. Severe degenerative changes seen involving the right glenohumeral joint. IMPRESSION: Mildly increased right lung opacities are noted concerning for possible asymmetric edema or possibly pneumonia. Aortic Atherosclerosis (ICD10-I70.0). Electronically Signed   By: Marijo Conception M.D.   On: 02/28/2019 12:26

## 2019-03-01 NOTE — Progress Notes (Signed)
Offered CPAP device to patient who continues to refuse, machine not in room.  Pt. States that she cant tolerate the pressure from the machine even though it is adjustable.  Would like to wear Rivereno at QHS.

## 2019-03-01 NOTE — Progress Notes (Signed)
[facility]  PROGRESS NOTE  [name] YPP:[id number] DOB: Mar 02, 1930 DOA: 02/25/2019 PCP: Lucianne Lei, MD   Brief HPI:   85 year old female with a history of hypertension, diabetes mellitus, bradycardia, status post pacemaker placement came to hospital with abnormal labs and fatigue.  Patient was seen by her PCP had labs drawn which showed that renal function was abnormal.  Patient has been taking Motrin off and on for past 1 month for chronic back pain.  Patient has HCTZ mentioning med rec list but has not been taking that for quite a while.  She does take Micardis for hypertension.  Patient baseline creatinine was around 1.0 per labs shown in epic in 2017.  COVID-19 test is negative    Subjective   Patient seen and examined, sitting in bed comfortably.  Denies shortness of breath.  She was transferred to stepdown yesterday due to pulmonary edema.  Started on nitroglycerin infusion.  Appreciate cardiology consultation   Assessment/Plan:     1. Acute kidney injury-patient presented with creatinine of 4.46 on admission, last creatinine was 1.0 and 2017.  Likely multifactorial from Motrin use, poor p.o. intake, ARB .  Renal ultrasound obtained showed no hydronephrosis. Started on IV normal saline.  Nephrology following.  Creatinine today has  improved to 3.97.   2. Acute pulmonary edema-patient devloped pulmonary edema this morning likely from fluid overload, which patient received for acute kidney injury.  Will start patient on nitroglycerin infusion.  Transfer to stepdown for closer monitoring.  Fentanyl 50 mcg every 2 hours as needed for dyspnea.  Will avoid morphine due to worsening renal function.  Patient has ST depressions in lead I , V5 V6.  Mild elevation of troponin  0.12- 0.2..  Will consult cardiology for further recommendations regarding her management.  3. Multiple myeloma-new diagnosis, patient presente agenic INR female d with AKI, hypercalcemia, hypoproteinemia.   SPEP labs showed M spike protein with 4.1 g/dL.  Urinary kappa free light chain 9676.  Kappa lambda light chain ratio 1209.58.  Called and discussed with oncologist, patient most likely has multiple myeloma.  No bone marrow biopsy recommended at this time due to COVID-19.  Patient has been started on IV Decadron per oncology.  4. Hypercalcemia-today calcium is 9.9, corrected calcium is 11.3.  It has improved after starting on IV hydration with normal saline.  IV fluids are currently hold due to development of pulmonary edema as above.  5. Hypertension-blood pressure well controlled on Norvasc 10 mg daily, Micardis 80 mg daily.  She does not take HCTZ.  Will hold Micardis due to worsening renal function.  Continue amlodipine.  6. Diabetes mellitus type 2-continue sliding scale insulin with NovoLog.  7. Gout-stable, continue allopurinol.  8. 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  9. Anemia of chronic disease-patient's hemoglobin is 7.7, likely from underlying renal disease.  FOBT obtained during this hospitalization is negative.  10. Hypokalemia-replete     CBG: Recent Labs  Lab 02/28/19 1242 02/28/19 1626 02/28/19 2141 03/01/19 0755 03/01/19 1156  GLUCAP 109* 109* 148* 113* 239*    CBC: Recent Labs  Lab 02/25/19 1103 02/25/19 1511 02/26/19 0353 02/28/19 0443  WBC 4.4 5.3 4.7 5.0  NEUTROABS 2.5  --   --   --   HGB 8.6* 8.3* 7.8* 7.7*  HCT 26.4* 25.7* 25.6* 24.3*  MCV 91.0 90.5 93.4 93.5  PLT 181 191 184 458    Basic Metabolic Panel: Recent Labs  Lab 02/25/19 1103 02/26/19 0353 02/27/19  4259 02/28/19 0443 03/01/19 0258  NA 137 138 139 139 137  K 3.1* 3.5 3.2* 3.8 4.1  CL 101 104 108 108 105  CO2 23 23 22 22  20*  GLUCOSE 127* 88 84 91 158*  BUN 50* 45* 43* 42* 43*  CREATININE 4.46* 4.21* 4.11* 4.03* 3.97*  CALCIUM 10.8* 10.2 9.6 10.0 9.9  MG 2.3  --   --   --   --   PHOS  --   --   --  3.7 4.9*     DVT prophylaxis:  Heparin  Code Status: Full code  Family Communication: No family at bedside  Disposition Plan: likely home when medically ready for discharge     Consultants:  None  Procedures:  None   Antibiotics:   Anti-infectives (From admission, onward)   None       Objective   Vitals:   03/01/19 1000 03/01/19 1100 03/01/19 1200 03/01/19 1300  BP: 136/67 (!) 134/53 (!) 142/53 (!) 137/55  Pulse: 70 73 73 70  Resp: 14 19 19 18   Temp:   98.2 F (36.8 C)   TempSrc:   Oral   SpO2: 99% 99% 94% 93%  Weight:      Height:        Intake/Output Summary (Last 24 hours) at 03/01/2019 1344 Last data filed at 03/01/2019 1300 Gross per 24 hour  Intake 191.65 ml  Output 650 ml  Net -458.35 ml   Filed Weights   02/25/19 1012  Weight: 81.2 kg     Physical Examination:  General-appears in no acute distress Heart-S1-S2, regular, no murmur auscultated Lungs-clear to auscultation bilaterally, no wheezing or crackles auscultated Abdomen-soft, nontender, no organomegaly Extremities-no edema in the lower extremities Neuro-alert, oriented x3, no focal deficit noted  Data Reviewed: I have personally reviewed following labs and imaging studies   Recent Results (from the past 240 hour(s))  SARS Coronavirus 2 (CEPHEID - Performed in [place] hospital lab), Hosp Order     Status: None   Collection Time: 02/25/19 11:58 AM  Result Value Ref Range Status   SARS Coronavirus 2 NEGATIVE NEGATIVE Final    Comment: (NOTE) If result is NEGATIVE SARS-CoV-2 target nucleic acids are NOT DETECTED. The SARS-CoV-2 RNA is generally detectable in upper and lower  respiratory specimens during the acute phase of infection. The lowest  concentration of SARS-CoV-2 viral copies this assay can detect is 250  copies / mL. A negative result does not preclude SARS-CoV-2 infection  and should not be used as the sole basis for treatment or other  patient management decisions.  A negative result may occur  with  improper specimen collection / handling, submission of specimen other  than nasopharyngeal swab, presence of viral mutation(s) within the  areas targeted by this assay, and inadequate number of viral copies  (<250 copies / mL). A negative result must be combined with clinical  observations, patient history, and epidemiological information. If result is POSITIVE SARS-CoV-2 target nucleic acids are DETECTED. The SARS-CoV-2 RNA is generally detectable in upper and lower  respiratory specimens dur ing the acute phase of infection.  Positive  results are indicative of active infection with SARS-CoV-2.  Clinical  correlation with patient history and other diagnostic information is  necessary to determine patient infection status.  Positive results do  not rule out bacterial infection or co-infection with other viruses. If result is PRESUMPTIVE POSTIVE SARS-CoV-2 nucleic acids MAY BE PRESENT.   A presumptive positive result was obtained on  the submitted specimen  and confirmed on repeat testing.  While 2019 novel coronavirus  (SARS-CoV-2) nucleic acids may be present in the submitted sample  additional confirmatory testing may be necessary for epidemiological  and / or clinical management purposes  to differentiate between  SARS-CoV-2 and other Sarbecovirus currently known to infect humans.  If clinically indicated additional testing with an alternate test  methodology [id number]) is advised. The SARS-CoV-2 RNA is generally  detectable in upper and lower respiratory sp ecimens during the acute  phase of infection. The expected result is Negative. Fact Sheet for Patients:  StrictlyIdeas.no Fact Sheet for Healthcare Providers: BankingDealers.co.za This test is not yet approved or cleared by the [place] FDA and has been authorized for detection and/or diagnosis of SARS-CoV-2 by FDA under an Emergency Use Authorization (EUA).  This EUA  will remain in effect (meaning this test can be used) for the duration of the COVID-19 declaration under Section 564(b)(1) of the Act, 21 U.S.C. section 360bbb-3(b)(1), unless the authorization is terminated or revoked sooner. Performed at [facility], [place] [address] [place], [place] [address]   Culture, Urine     Status: Abnormal   Collection Time: 02/26/19  7:02 PM  Result Value Ref Range Status   Specimen Description   Final    URINE, CLEAN CATCH Performed at [facility], [place] [address] [place], [place] [address]    Special Requests   Final    NONE Performed at [facility], [place] [address], [place], [place] [address]    Culture (A)  Final    50,000 COLONIES/mL LACTOBACILLUS SPECIES Standardized susceptibility testing for this organism is not available. Performed at [facility], [place] [address] [place], [place] [address]    Report Status 02/27/2019 FINAL  Final  MRSA PCR Screening     Status: None   Collection Time: 02/28/19 12:45 PM  Result Value Ref Range Status   MRSA by PCR NEGATIVE NEGATIVE Final    Comment:        The GeneXpert MRSA Assay (FDA approved for NASAL specimens only), is one component of a comprehensive MRSA colonization surveillance program. It is not intended to diagnose MRSA infection nor to guide or monitor treatment for MRSA infections. Performed at [facility], [place] [address], [place],  [address]      Liver Function Tests: Recent Labs  Lab 02/25/19 1103 02/26/19 0353 02/28/19 0443 03/01/19 0258  AST 18 18  --   --   ALT 12 13  --   --   ALKPHOS 30* 26*  --   --   BILITOT 0.8 0.3  --   --   PROT 10.7* 9.4*  --   --   ALBUMIN 3.0* 2.5* 2.5* 2.4*   Recent Labs  Lab 02/25/19 1103  LIPASE 26      Studies: Dg Chest Port 1 View  Result Date: 02/28/2019 CLINICAL DATA:  Dyspnea. EXAM: PORTABLE CHEST 1 VIEW COMPARISON:  Radiograph of Feb 25, 2019. FINDINGS: Stable cardiomegaly. Atherosclerosis of thoracic aorta is noted. Left-sided pacemaker is unchanged in position. No pneumothorax or pleural effusion is noted. Left lung is clear. Mildly increased right lung opacities are noted concerning for either asymmetric edema or possibly pneumonia. Severe degenerative changes seen involving the right glenohumeral joint. IMPRESSION: Mildly increased right lung opacities are noted concerning for possible asymmetric edema or possibly pneumonia. Aortic Atherosclerosis (ICD10-I70.0). Electronically Signed   By: Marijo Conception  M.D.   On: 02/28/2019 12:26    Scheduled Meds: . allopurinol  100 mg Oral Daily  . amLODipine  10 mg Oral Daily  . bisacodyl  10 mg Rectal Once  . Chlorhexidine Gluconate Cloth  6 each Topical Daily  . dexamethasone  12 mg Intravenous Daily  . dorzolamide  1 drop Left Eye QPM  . fluorometholone  1 drop Right Eye TID  . heparin  5,000 Units Subcutaneous Q8H  . insulin aspart  0-9 Units Subcutaneous TID WC  . latanoprost  1 drop Left Eye QHS  . mouth rinse  15 mL Mouth Rinse BID  . pantoprazole (PROTONIX) IV  40 mg Intravenous Q24H  . pregabalin  50 mg Oral Daily    Admission status: Inpatient: Based on patients clinical presentation and evaluation of above clinical data, I have made determination that patient meets Inpatient criteria at this time.  Time spent: 25 min  [facility] Pager [phone]. If 7PM-7AM, please contact night-coverage at [email or web address], Office  [phone]  password [place]  03/01/2019, 1:44 PM  LOS: 3 days

## 2019-03-01 NOTE — Consult Note (Signed)
CARDIOLOGY CONSULT NOTE       Patient ID: [name] MRN: [id number] DOB/AGE: Mar 02, 1930 85 y.o.  Admit date: 02/25/2019 Referring Physician: Darrick Meigs Primary Physician: Lucianne Lei, MD Primary Cardiologist: Allred Reason for Consultation: Pulmonary Edema  Active Problems:   AKI (acute kidney injury) ([place])   Hypokalemia   HPI:  85 y.o. history of SSS with dual chamber pacer followed by Dr Rayann Heman. No history of CAD. History of HTN, DM, OSA. Non productive cough with chronic pleuritic chest pain Noted to have renal failure with Cr 4.46 Thought to have dehydration and also on diuretic and Micardis  [place] normal and UA bland Discovered to have M spike on SPEP and now CRF due to myeloma 5/15 developed pulmonary edema with dyspnea Started on nitro Troponin 0.12 no chest pain This am feels well with sats 100% on 2L No real rise in troponin 0.15 ->0.20 ECG not interpretable due to AV pacing   ROS All other systems reviewed and negative except as noted above  Past Medical History:  Diagnosis Date   Benign paroxysmal positional vertigo 04/06/2010   Benign positional vertigo    BRADYCARDIA 01/27/2009   s/p PPM   CAROTID BRUIT 02/24/2008   DIABETES MELLITUS, TYPE II 02/24/2008   Glaucoma    HYPERTENSION 02/24/2008   Obstructive sleep apnea    Sick sinus syndrome ([place])     Family History  Problem Relation Age of Onset   Congestive Heart Failure Mother    Tuberculosis Mother    Multiple myeloma Mother    Bone cancer Father    Arthritis Father    Breast cancer Sister    Colon cancer Sister    Hypertension Sister    Dementia Sister    Alcohol abuse Son     Social History   Socioeconomic History   Marital status: Married    Spouse name: Not on file   Number of children: 5   Years of education: Not on file   Highest education level: Not on file  Occupational History   Occupation: retired  Scientist, product/process development strain: Not on file   Food  insecurity:    Worry: Not on file    Inability: Not on Lexicographer needs:    Medical: Not on file    Non-medical: Not on file  Tobacco Use   Smoking status: Never Smoker   Smokeless tobacco: Never Used  Substance and Sexual Activity   Alcohol use: No   Drug use: No   Sexual activity: Not on file  Lifestyle   Physical activity:    Days per week: Not on file    Minutes per session: Not on file   Stress: Not on file  Relationships   Social connections:    Talks on phone: Not on file    Gets together: Not on file    Attends religious service: Not on file    Active member of club or organization: Not on file    Attends meetings of clubs or organizations: Not on file    Relationship status: Not on file   Intimate partner violence:    Fear of current or ex partner: Not on file    Emotionally abused: Not on file    Physically abused: Not on file    Forced sexual activity: Not on file  Other Topics Concern   Not on file  Social History Narrative   Retired Marine scientist    Past Surgical History:  Procedure  Laterality Date   ABDOMINAL HYSTERECTOMY     1980's   BACK SURGERY     x 3   EP IMPLANTABLE DEVICE N/A 05/16/2016   Procedure: PPM Generator Changeout;  Surgeon: Thompson Grayer, MD;  Location: [facility];  Service: Cardiovascular;  Laterality: N/A;   KNEE SURGERY Right    2003   KNEE SURGERY Left    2011   PACEMAKER INSERTION        allopurinol  100 mg Oral Daily   amLODipine  10 mg Oral Daily   bisacodyl  10 mg Rectal Once   dexamethasone  12 mg Intravenous Daily   dorzolamide  1 drop Left Eye QPM   fluorometholone  1 drop Right Eye TID   heparin  5,000 Units Subcutaneous Q8H   insulin aspart  0-9 Units Subcutaneous TID WC   latanoprost  1 drop Left Eye QHS   pantoprazole (PROTONIX) IV  40 mg Intravenous Q24H   pregabalin  50 mg Oral Daily    sodium chloride Stopped (02/28/19 2046)   ferumoxytol Stopped (02/28/19 1534)    nitroGLYCERIN 15 mcg/min (03/01/19 0900)    Physical Exam: Blood pressure (!) 142/59, pulse 70, temperature 98.3 F (36.8 C), temperature source Oral, resp. rate 14, height 5' 5"  (1.651 m), weight 81.2 kg, SpO2 99 %.   Affect appropriate Elderly black female  HEENT: normal Neck supple with no adenopathy JVP normal no bruits no thyromegaly Lungs clear with no wheezing and good diaphragmatic motion Heart:  S1/S2 no murmur, no rub, gallop or click PMI normal  Pacer under left clavicle  Abdomen: benighn, BS positve, no tenderness, no AAA no bruit.  No HSM or HJR Distal pulses intact with no bruits No edema Neuro non-focal Skin warm and dry No muscular weakness   Labs:   Lab Results  Component Value Date   WBC 5.0 02/28/2019   HGB 7.7 (L) 02/28/2019   HCT 24.3 (L) 02/28/2019   MCV 93.5 02/28/2019   PLT 164 02/28/2019    Recent Labs  Lab 02/26/19 0353  03/01/19 0258  NA 138   < > 137  K 3.5   < > 4.1  CL 104   < > 105  CO2 23   < > 20*  BUN 45*   < > 43*  CREATININE 4.21*   < > 3.97*  CALCIUM 10.2   < > 9.9  PROT 9.4*  --   --   BILITOT 0.3  --   --   ALKPHOS 26*  --   --   ALT 13  --   --   AST 18  --   --   GLUCOSE 88   < > 158*   < > = values in this interval not displayed.   Lab Results  Component Value Date   CKTOTAL 127 03/21/2010   CKMB 0.8 03/21/2010   TROPONINI 0.20 (HH) 02/28/2019    Lab Results  Component Value Date   CHOL 108 02/26/2019   CHOL 162 03/09/2015   CHOL  03/21/2010    165        ATP III CLASSIFICATION:  <200     mg/dL   Desirable  200-239  mg/dL   Borderline High  >=240    mg/dL   High          Lab Results  Component Value Date   HDL 20 (L) 02/26/2019   HDL 43 03/09/2015   HDL 35 (L) 03/21/2010  Lab Results  Component Value Date   LDLCALC 59 02/26/2019   LDLCALC 97 03/09/2015   LDLCALC (H) 03/21/2010    104        Total Cholesterol/HDL:CHD Risk Coronary Heart Disease Risk Table                     Men   Women   1/2 Average Risk   3.4   3.3  Average Risk       5.0   4.4  2 X Average Risk   9.6   7.1  3 X Average Risk  23.4   11.0        Use the calculated Patient Ratio above and the CHD Risk Table to determine the patient's CHD Risk.        ATP III CLASSIFICATION (LDL):  <100     mg/dL   Optimal  100-129  mg/dL   Near or Above                    Optimal  130-159  mg/dL   Borderline  160-189  mg/dL   High  >190     mg/dL   Very High   Lab Results  Component Value Date   TRIG 145 02/26/2019   TRIG 108 03/09/2015   TRIG 132 03/21/2010   Lab Results  Component Value Date   CHOLHDL 5.4 02/26/2019   CHOLHDL 3.8 03/09/2015   CHOLHDL 4.7 03/21/2010   No results found for: LDLDIRECT    Radiology: Dg Chest 1 View  Result Date: 02/25/2019 CLINICAL DATA:  Acute kidney injury and cough. EXAM: CHEST  1 VIEW COMPARISON:  None. FINDINGS: Left chest wall pacer device is noted with leads in the right atrial appendage and right ventricle. Mild cardiac enlargement. Aortic atherosclerosis. No pleural effusion or edema. No airspace opacities. IMPRESSION: 1. Cardiac enlargement.  No acute findings. 2.  Aortic Atherosclerosis (ICD10-I70.0). Electronically Signed   By: Kerby Moors M.D.   On: 02/25/2019 13:51   US Renal  Result Date: 02/25/2019 CLINICAL DATA:  85 year old female with acute renal failure EXAM: RENAL / URINARY TRACT ULTRASOUND COMPLETE COMPARISON:  None. FINDINGS: Right Kidney: Renal measurements: 11.9 x 4.1 x 4.3 cm = volume: 111 mL . Echogenicity within normal limits. No mass or hydronephrosis visualized. Left Kidney: Renal measurements: 12.5 x 4.2 x 4.2 cm = volume: 115 mL. Two renal cysts are present measuring 1.2 cm. Echogenicity within normal limits. No mass or hydronephrosis visualized. Bladder: Appears normal for degree of bladder distention, but bladder not well distended. IMPRESSION: 1. Unremarkable kidneys except for 2 small LEFT renal cysts. No evidence of hydronephrosis.  Electronically Signed   By: Margarette [place] M.D.   On: 02/25/2019 16:00   Dg Chest Port 1 View  Result Date: 02/28/2019 CLINICAL DATA:  Dyspnea. EXAM: PORTABLE CHEST 1 VIEW COMPARISON:  Radiograph of Feb 25, 2019. FINDINGS: Stable cardiomegaly. Atherosclerosis of thoracic aorta is noted. Left-sided pacemaker is unchanged in position. No pneumothorax or pleural effusion is noted. Left lung is clear. Mildly increased right lung opacities are noted concerning for either asymmetric edema or possibly pneumonia. Severe degenerative changes seen involving the right glenohumeral joint. IMPRESSION: Mildly increased right lung opacities are noted concerning for possible asymmetric edema or possibly pneumonia. Aortic Atherosclerosis (ICD10-I70.0). Electronically Signed   By: Marijo Conception M.D.   On: 02/28/2019 12:26    EKG: AV pacing    ASSESSMENT AND PLAN:   Pulmonary Edema  CXR yesterday not impressive could really be consistent with pneumonia RLL not CHF. She was volume loaded due to renal failure and given diagnosis of myeloma may have diastolic dysfunction or even amyloid heart disease For now would continue iv nitro for pre load reduction TTE pending Repeat CXR with PA lateral to r/o pneumonia   A/CRF:  Per nephrology concerning that Cr has not improved with hydration ARB held per nephrology no indicatio for dialysis   Anemia:  Consider [place] in setting of renal failure   Elevated Troponin: no chest pain ECG paced not likely significant in setting of renal failure TTE pending  PPM:  Normal AV pacing f/u Allred  Signed: Jenkins Rouge 03/01/2019, 9:47 AM

## 2019-03-01 NOTE — Progress Notes (Signed)
Subjective:  Moved to ICU to be placed on ntg drip given positive troponin and sxms.  Echo shows normal LV fxn, mod elevated right sided pressure.  A liter of UOP- renal function essentially stable.  Feels OK- is doing pretty well considering all of the issues  Objective Vital signs in last 24 hours: Vitals:   03/01/19 0600 03/01/19 0700 03/01/19 0800 03/01/19 0936  BP: (!) 150/58 (!) 131/51 (!) 134/56 (!) 142/59  Pulse: 70 70 70   Resp: _0 Temp:   98.3 F (36.8 C)   TempSrc:   Oral   SpO2: 98% 98% 99%   Weight:      Height:       Weight change:   Intake/Output Summary (Last 24 hours) at 03/01/2019 1104 Last data filed at 03/01/2019 0900 Gross per 24 hour  Intake 1141.25 ml  Output 950 ml  Net 191.25 ml    Assessment/ Plan: Pt is a 85 y.o. yo female with DM, HTN, unknown baseline renal status (crt 1.0 to 1.3 in 2017 ) who was admitted on 02/25/2019 with AKI (vs chronic)  in the setting of multiple recent renal insults (NSAIDS/ARB)  Assessment/Plan: 1. AKI- recent NSAIDS and ARB. urinalysis pretty bland- 6-10 WBC but with trace leuks and neg nitrite, urine culture not remarkable for treatment.  Renal ultrasound pretty unremarkable.  Protein to crt ratio 1.8-  30 on the dip.  SPEP  positive in the setting of anemia, hypercalcemia and hyperproteinemia.  Good UOP and crt trending down albeit slowly  - no indications for dialysis at this time  2. Hypercalcemia - improving with IVF-- no exogenous calcium- hoping decadron will improve as well   3. Anemia- worsening with IVF- iron stores low , will replete and gave one time dose of darbe - drifting down still  4. HTN/volume- BP reasonable on norvasc 10 and PRN hydralazine - decreased IVF since tank was full-- now saline lock.  Echo looked reasonable- cards not recommending anything specific 5. Hypokalemia- repleted in past - better today  6.  SPEP positive- likely diagnosis of multiple myeloma-  Work up per onc once cardiac issues  settled- started on decadron  [facility]    Labs: Basic Metabolic Panel: Recent Labs  Lab 02/27/19 0438 02/28/19 0443 03/01/19 0258  NA 139 139 137  K 3.2* 3.8 4.1  CL 108 108 105  CO2 22 22 20*  GLUCOSE 84 91 158*  BUN 43* 42* 43*  CREATININE 4.11* 4.03* 3.97*  CALCIUM 9.6 10.0 9.9  PHOS  --  3.7 4.9*   Liver Function Tests: Recent Labs  Lab 02/25/19 1103 02/26/19 0353 02/28/19 0443 03/01/19 0258  AST 18 18  --   --   ALT 12 13  --   --   ALKPHOS 30* 26*  --   --   BILITOT 0.8 0.3  --   --   PROT 10.7* 9.4*  --   --   ALBUMIN 3.0* 2.5* 2.5* 2.4*   Recent Labs  Lab 02/25/19 1103  LIPASE 26   No results for input(s): AMMONIA in the last 168 hours. CBC: Recent Labs  Lab 02/25/19 1103 02/25/19 1511 02/26/19 0353 02/28/19 0443  WBC 4.4 5.3 4.7 5.0  NEUTROABS 2.5  --   --   --   HGB 8.6* 8.3* 7.8* 7.7*  HCT 26.4* 25.7* 25.6* 24.3*  MCV 91.0 90.5 93.4 93.5  PLT 181 191 184 164   Cardiac Enzymes: Recent  Labs  Lab 02/28/19 1000 02/28/19 1504 02/28/19 2118  TROPONINI 0.12* 0.15* 0.20*   CBG: Recent Labs  Lab 02/28/19 1151 02/28/19 1242 02/28/19 1626 02/28/19 2141 03/01/19 0755  GLUCAP 106* 109* 109* 148* 113*    Iron Studies:  Recent Labs    02/28/19 0443  IRON 61  TIBC 215*  FERRITIN 180   Studies/Results: Dg Chest Port 1 View  Result Date: 02/28/2019 CLINICAL DATA:  Dyspnea. EXAM: PORTABLE CHEST 1 VIEW COMPARISON:  Radiograph of Feb 25, 2019. FINDINGS: Stable cardiomegaly. Atherosclerosis of thoracic aorta is noted. Left-sided pacemaker is unchanged in position. No pneumothorax or pleural effusion is noted. Left lung is clear. Mildly increased right lung opacities are noted concerning for either asymmetric edema or possibly pneumonia. Severe degenerative changes seen involving the right glenohumeral joint. IMPRESSION: Mildly increased right lung opacities are noted concerning for possible asymmetric edema or possibly pneumonia.  Aortic Atherosclerosis (ICD10-I70.0). Electronically Signed   By: Marijo Conception M.D.   On: 02/28/2019 12:26   Medications: Infusions: . sodium chloride Stopped (02/28/19 2046)  . ferumoxytol Stopped (02/28/19 1534)  . nitroGLYCERIN 15 mcg/min (03/01/19 0900)    Scheduled Medications: . allopurinol  100 mg Oral Daily  . amLODipine  10 mg Oral Daily  . bisacodyl  10 mg Rectal Once  . Chlorhexidine Gluconate Cloth  6 each Topical Daily  . dexamethasone  12 mg Intravenous Daily  . dorzolamide  1 drop Left Eye QPM  . fluorometholone  1 drop Right Eye TID  . heparin  5,000 Units Subcutaneous Q8H  . insulin aspart  0-9 Units Subcutaneous TID WC  . latanoprost  1 drop Left Eye QHS  . mouth rinse  15 mL Mouth Rinse BID  . pantoprazole (PROTONIX) IV  40 mg Intravenous Q24H  . pregabalin  50 mg Oral Daily    have reviewed scheduled and prn medications.  Physical Exam: General: more alert than yesterday- paying attention to that crt number  Heart: RRR Lungs: mostly clear- Abdomen: soft, non tender Extremities: no edema     03/01/2019,11:04 AM  LOS: 3 days

## 2019-03-02 DIAGNOSIS — R0600 Dyspnea, unspecified: Secondary | ICD-10-CM

## 2019-03-02 LAB — RENAL FUNCTION PANEL
Albumin: 2.4 g/dL — ABNORMAL LOW (ref 3.5–5.0)
Anion gap: 13 (ref 5–15)
BUN: 57 mg/dL — ABNORMAL HIGH (ref 8–23)
CO2: 21 mmol/L — ABNORMAL LOW (ref 22–32)
Calcium: 9.8 mg/dL (ref 8.9–10.3)
Chloride: 103 mmol/L (ref 98–111)
Creatinine, Ser: 4.15 mg/dL — ABNORMAL HIGH (ref 0.44–1.00)
GFR calc Af Amer: 11 mL/min — ABNORMAL LOW (ref >60)
GFR calc non Af Amer: 9 mL/min — ABNORMAL LOW (ref >60)
Glucose, Bld: 137 mg/dL — ABNORMAL HIGH (ref 70–99)
Phosphorus: 3.9 mg/dL (ref 2.5–4.6)
Potassium: 4.2 mmol/L (ref 3.5–5.1)
Sodium: 137 mmol/L (ref 135–145)

## 2019-03-02 LAB — IGG, IGA, IGM
IgA: 36 mg/dL — ABNORMAL LOW (ref 64–422)
IgG (Immunoglobin G), Serum: 5930 mg/dL — ABNORMAL HIGH (ref 586–1602)
IgM (Immunoglobulin M), Srm: 12 mg/dL — ABNORMAL LOW (ref 26–217)

## 2019-03-02 LAB — CBC WITH DIFFERENTIAL/PLATELET
Abs Immature Granulocytes: 0.08 10*3/uL — ABNORMAL HIGH (ref 0.00–0.07)
Basophils Absolute: 0 10*3/uL (ref 0.0–0.1)
Basophils Relative: 0 %
Eosinophils Absolute: 0.2 10*3/uL (ref 0.0–0.5)
Eosinophils Relative: 2 %
HCT: 21.2 % — ABNORMAL LOW (ref 36.0–46.0)
Hemoglobin: 6.8 g/dL — CL (ref 12.0–15.0)
Immature Granulocytes: 1 %
Lymphocytes Relative: 13 %
Lymphs Abs: 1 10*3/uL (ref 0.7–4.0)
MCH: 29.3 pg (ref 26.0–34.0)
MCHC: 32.1 g/dL (ref 30.0–36.0)
MCV: 91.4 fL (ref 80.0–100.0)
Monocytes Absolute: 0.3 10*3/uL (ref 0.1–1.0)
Monocytes Relative: 4 %
Neutro Abs: 6.1 10*3/uL (ref 1.7–7.7)
Neutrophils Relative %: 80 %
Platelets: 162 10*3/uL (ref 150–400)
RBC: 2.32 MIL/uL — ABNORMAL LOW (ref 3.87–5.11)
RDW: 14.5 % (ref 11.5–15.5)
WBC: 7.6 10*3/uL (ref 4.0–10.5)
nRBC: 0 % (ref 0.0–0.2)

## 2019-03-02 LAB — GLUCOSE, CAPILLARY
Glucose-Capillary: 126 mg/dL — ABNORMAL HIGH (ref 70–99)
Glucose-Capillary: 131 mg/dL — ABNORMAL HIGH (ref 70–99)
Glucose-Capillary: 147 mg/dL — ABNORMAL HIGH (ref 70–99)
Glucose-Capillary: 146 mg/dL — ABNORMAL HIGH (ref 70–99)

## 2019-03-02 LAB — HEMOGLOBIN AND HEMATOCRIT, BLOOD
HCT: 24.9 % — ABNORMAL LOW (ref 36.0–46.0)
Hemoglobin: 8.2 g/dL — ABNORMAL LOW (ref 12.0–15.0)

## 2019-03-02 LAB — PREPARE RBC (CROSSMATCH)

## 2019-03-02 LAB — URIC ACID: Uric Acid, Serum: 7.5 mg/dL — ABNORMAL HIGH (ref 2.5–7.1)

## 2019-03-02 MED ORDER — SODIUM CHLORIDE 0.9% IV SOLUTION
Freq: Once | INTRAVENOUS | Status: AC
  Administered 2019-03-02: 08:00:00 via INTRAVENOUS

## 2019-03-02 MED ORDER — ISOSORB DINITRATE-HYDRALAZINE 20-37.5 MG PO TABS
1.0000 | ORAL_TABLET | Freq: Three times a day (TID) | ORAL | Status: DC
  Administered 2019-03-02 – 2019-03-08 (×20): 1 via ORAL
  Filled 2019-03-02 (×22): qty 1

## 2019-03-02 NOTE — Progress Notes (Signed)
Pt. States that her family brought her own CPAP device up today.  Nursing will assist with plugging into wall when patient ready for use.  Pt. Can place device on herself without assistance.

## 2019-03-02 NOTE — Progress Notes (Signed)
[facility]  PROGRESS NOTE  [name] HCW:[id number] DOB: Feb 29, 1930 DOA: 02/25/2019 PCP: Lucianne Lei, MD   Brief HPI:   85 year old female with a history of hypertension, diabetes mellitus, bradycardia, status post pacemaker placement came to hospital with abnormal labs and fatigue.  Patient was seen by her PCP had labs drawn which showed that renal function was abnormal.  Patient has been taking Motrin off and on for past 1 month for chronic back pain.  Patient has HCTZ mentioning med rec list but has not been taking that for quite a while.  She does take Micardis for hypertension.  Patient baseline creatinine was around 1.0 per labs shown in epic in 2017.  COVID-19 test is negative    Subjective   Patient seen and examined, denies chest pain or shortness of breath.  Hemoglobin 6.8 this morning.  1 unit PRBC ordered.   Assessment/Plan:     1. Acute kidney injury-patient presented with creatinine of 4.46 on admission, last creatinine was 1.0 and 2017.  Likely multifactorial from Motrin use, poor p.o. intake, ARB, patient diagnosed with multiple myeloma.  Renal ultrasound obtained showed no hydronephrosis. Started on IV normal saline.  Nephrology following.  Creatinine today has 4.15.   2. Acute pulmonary edema-patient devloped pulmonary edema this morning likely from fluid overload, which patient received for acute kidney injury.  Will start patient on nitroglycerin infusion.  Patient was started on nitroglycerin infusion, transferred to stepdown.  Also had mild elevation of troponin 0.1-2.20.  Cardiology was consulted.  No new recommendations.  Cardiology has signed off.    3. Multiple myeloma-new diagnosis, patient presented with AKI, hypercalcemia, hypoproteinemia.  SPEP labs showed M spike protein with 4.1 g/dL.  Urinary kappa free light chain 9676.  Kappa lambda light chain ratio 1209.58.  Called and discussed with oncologist, patient most likely has multiple myeloma.  No  bone marrow biopsy recommended at this time due to COVID-19.  Patient has been started on IV Decadron per oncology.  4. Hypercalcemia-today calcium is 9.8, corrected calcium is 11.2.  It has improved after starting on IV hydration with normal saline.  IV fluids are currently hold due to development of pulmonary edema as above.  5. Hypertension-blood pressure well controlled on Norvasc 10 mg daily, Micardis 80 mg daily.  She does not take HCTZ.  Will hold Micardis due to worsening renal function.  Continue amlodipine.  6. Diabetes mellitus type 2-continue sliding scale insulin with NovoLog.  CBGs well controlled.  7. Gout-stable, continue allopurinol.  8. 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  9. Anemia of chronic disease-patient's hemoglobin is 6.8 this morning.  1 unit PRBC ordered.  Follow CBC in a.m.  10. Hypokalemia-replete     CBG: Recent Labs  Lab 03/01/19 0755 03/01/19 1156 03/01/19 1744 03/01/19 2109 03/02/19 0750  GLUCAP 113* 239* 135* 161* 126*    CBC: Recent Labs  Lab 02/25/19 1103 02/25/19 1511 02/26/19 0353 02/28/19 0443 03/02/19 0300  WBC 4.4 5.3 4.7 5.0 7.6  NEUTROABS 2.5  --   --   --  6.1  HGB 8.6* 8.3* 7.8* 7.7* 6.8*  HCT 26.4* 25.7* 25.6* 24.3* 21.2*  MCV 91.0 90.5 93.4 93.5 91.4  PLT 181 191 184 164 176    Basic Metabolic Panel: Recent Labs  Lab 02/25/19 1103 02/26/19 0353 02/27/19 0438 02/28/19 0443 03/01/19 0258 03/02/19 0300  NA 137 138 139 139 137 137  K 3.1* 3.5 3.2* 3.8 4.1 4.2  CL 101 104  108 108 105 103  CO2 _0 20* 21*  GLUCOSE 127* 88 84 91 158* 137*  BUN 50* 45* 43* 42* 43* 57*  CREATININE 4.46* 4.21* 4.11* 4.03* 3.97* 4.15*  CALCIUM 10.8* 10.2 9.6 10.0 9.9 9.8  MG 2.3  --   --   --   --   --   PHOS  --   --   --  3.7 4.9* 3.9     DVT prophylaxis: Heparin  Code Status: Full code  Family Communication: No family at bedside  Disposition Plan: likely home when medically  ready for discharge     Consultants:  None  Procedures:  None   Antibiotics:   Anti-infectives (From admission, onward)   None       Objective   Vitals:   03/02/19 0831 03/02/19 0900 03/02/19 1000 03/02/19 1030  BP:  (!) 154/60 (!) 152/52 (!) 153/53  Pulse: 70 70 70   Resp: (!) _1 Temp: 97.7 F (36.5 C)     TempSrc: Oral     SpO2: 95% 95% 95%   Weight:      Height:        Intake/Output Summary (Last 24 hours) at 03/02/2019 1104 Last data filed at 03/02/2019 1028 Gross per 24 hour  Intake 662.2 ml  Output 375 ml  Net 287.2 ml   Filed Weights   02/25/19 1012  Weight: 81.2 kg     Physical Examination:  General-appears in no acute distress Heart-S1-S2, regular, no murmur auscultated Lungs-clear to auscultation bilaterally, no wheezing or crackles auscultated Abdomen-soft, nontender, no organomegaly Extremities-no edema in the lower extremities Neuro-alert, oriented x3, no focal deficit noted  Data Reviewed: I have personally reviewed following labs and imaging studies   Recent Results (from the past 240 hour(s))  SARS Coronavirus 2 (CEPHEID - Performed in [facility] hospital lab), Hosp Order     Status: None   Collection Time: 02/25/19 11:58 AM  Result Value Ref Range Status   SARS Coronavirus 2 NEGATIVE NEGATIVE Final    Comment: (NOTE) If result is NEGATIVE SARS-CoV-2 target nucleic acids are NOT DETECTED. The SARS-CoV-2 RNA is generally detectable in upper and lower  respiratory specimens during the acute phase of infection. The lowest  concentration of SARS-CoV-2 viral copies this assay can detect is 250  copies / mL. A negative result does not preclude SARS-CoV-2 infection  and should not be used as the sole basis for treatment or other  patient management decisions.  A negative result may occur with  improper specimen collection / handling, submission of specimen other  than nasopharyngeal swab, presence of viral mutation(s) within  the  areas targeted by this assay, and inadequate number of viral copies  (<250 copies / mL). A negative result must be combined with clinical  observations, patient history, and epidemiological information. If result is POSITIVE SARS-CoV-2 target nucleic acids are DETECTED. The SARS-CoV-2 RNA is generally detectable in upper and lower  respiratory specimens dur ing the acute phase of infection.  Positive  results are indicative of active infection with SARS-CoV-2.  Clinical  correlation with patient history and other diagnostic information is  necessary to determine patient infection status.  Positive results do  not rule out bacterial infection or co-infection with other viruses. If result is PRESUMPTIVE POSTIVE SARS-CoV-2 nucleic acids MAY BE PRESENT.   A presumptive positive result was obtained on the submitted specimen  and confirmed on repeat testing.  While 2019  novel coronavirus  (SARS-CoV-2) nucleic acids may be present in the submitted sample  additional confirmatory testing may be necessary for epidemiological  and / or clinical management purposes  to differentiate between  SARS-CoV-2 and other Sarbecovirus currently known to infect humans.  If clinically indicated additional testing with an alternate test  methodology [phone]) is advised. The SARS-CoV-2 RNA is generally  detectable in upper and lower respiratory sp ecimens during the acute  phase of infection. The expected result is Negative. Fact Sheet for Patients:  StrictlyIdeas.no Fact Sheet for Healthcare Providers: BankingDealers.co.za This test is not yet approved or cleared by the [place] FDA and has been authorized for detection and/or diagnosis of SARS-CoV-2 by FDA under an Emergency Use Authorization (EUA).  This EUA will remain in effect (meaning this test can be used) for the duration of the COVID-19 declaration under Section 564(b)(1) of the Act, 21  U.S.C. section 360bbb-3(b)(1), unless the authorization is terminated or revoked sooner. Performed at [facility], [place] [address], [place], [place] [address]   Culture, Urine     Status: Abnormal   Collection Time: 02/26/19  7:02 PM  Result Value Ref Range Status   Specimen Description   Final    URINE, CLEAN CATCH Performed at [facility], [place] [address] [place], [place] [address]    Special Requests   Final    NONE Performed at [facility], [place] [address], [place], [place] [address]    Culture (A)  Final    50,000 COLONIES/mL LACTOBACILLUS SPECIES Standardized susceptibility testing for this organism is not available. Performed at [facility], [place] [address], [place], [place] [address]    Report Status 02/27/2019 FINAL  Final  MRSA PCR Screening     Status: None   Collection Time: 02/28/19 12:45 PM  Result Value Ref Range Status   MRSA by PCR NEGATIVE NEGATIVE Final    Comment:        The GeneXpert MRSA Assay (FDA approved for NASAL specimens only), is one component of a comprehensive MRSA colonization surveillance program. It is not intended to diagnose MRSA infection nor to guide or monitor treatment for MRSA infections. Performed at [facility], [place] [address] [place], [place] [address]      Liver Function Tests: Recent Labs  Lab 02/25/19 1103 02/26/19 0353 02/28/19 0443 03/01/19 0258 03/02/19 0300  AST 18 18  --   --   --   ALT 12 13  --   --   --   ALKPHOS 30* 26*  --   --   --   BILITOT 0.8 0.3  --   --   --   PROT 10.7* 9.4*  --   --   --   ALBUMIN 3.0* 2.5* 2.5* 2.4* 2.4*   Recent Labs  Lab 02/25/19 1103  LIPASE 26      Studies: Dg Chest 2 View  Result Date: 03/01/2019 CLINICAL DATA:  Hypertension.  Diabetes. EXAM: CHEST - 2 VIEW COMPARISON:  Feb 28, 2019 FINDINGS: Stable cardiomegaly. The hila and mediastinum are unremarkable. Mild atelectasis in the  left base. Mild patchy opacity in the right lung, unchanged. No other acute changes. IMPRESSION: Stable patchy opacities in the right lung. No other interval changes. Electronically Signed   By: Dorise Bullion III M.D   On: 03/01/2019 17:04   Dg Chest Port 1 View  Result Date: 02/28/2019 CLINICAL DATA:  Dyspnea. EXAM: PORTABLE CHEST 1  VIEW COMPARISON:  Radiograph of Feb 25, 2019. FINDINGS: Stable cardiomegaly. Atherosclerosis of thoracic aorta is noted. Left-sided pacemaker is unchanged in position. No pneumothorax or pleural effusion is noted. Left lung is clear. Mildly increased right lung opacities are noted concerning for either asymmetric edema or possibly pneumonia. Severe degenerative changes seen involving the right glenohumeral joint. IMPRESSION: Mildly increased right lung opacities are noted concerning for possible asymmetric edema or possibly pneumonia. Aortic Atherosclerosis (ICD10-I70.0). Electronically Signed   By: Marijo Conception M.D.   On: 02/28/2019 12:26    Scheduled Meds: . allopurinol  100 mg Oral Daily  . amLODipine  10 mg Oral Daily  . bisacodyl  10 mg Rectal Once  . Chlorhexidine Gluconate Cloth  6 each Topical Daily  . dexamethasone  12 mg Intravenous Daily  . dorzolamide  1 drop Left Eye QPM  . fluorometholone  1 drop Right Eye TID  . heparin  5,000 Units Subcutaneous Q8H  . insulin aspart  0-9 Units Subcutaneous TID WC  . isosorbide-hydrALAZINE  1 tablet Oral TID  . latanoprost  1 drop Left Eye QHS  . mouth rinse  15 mL Mouth Rinse BID  . pantoprazole (PROTONIX) IV  40 mg Intravenous Q24H  . pregabalin  50 mg Oral Daily    Admission status: Inpatient: Based on patients clinical presentation and evaluation of above clinical data, I have made determination that patient meets Inpatient criteria at this time.  Time spent: 25 min  [facility] Pager [phone]. If 7PM-7AM, please contact night-coverage at [email or web address], Office   [phone]  password [email or web address]  03/02/2019, 11:04 AM  LOS: 4 days

## 2019-03-02 NOTE — Progress Notes (Signed)
Subjective:  Mention of pulmonary edema but on RA-  UOP has dropped off some and crt up today  Objective Vital signs in last 24 hours: Vitals:   03/02/19 0831 03/02/19 0900 03/02/19 1000 03/02/19 1030  BP:  (!) 154/60 (!) 152/52 (!) 153/53  Pulse: 70 70 70   Resp: (!) _0 Temp: 97.7 F (36.5 C)     TempSrc: Oral     SpO2: 95% 95% 95%   Weight:      Height:       Weight change:   Intake/Output Summary (Last 24 hours) at 03/02/2019 1135 Last data filed at 03/02/2019 1028 Gross per 24 hour  Intake 662.2 ml  Output 375 ml  Net 287.2 ml    Assessment/ Plan: Pt is a 85 y.o. yo female with DM, HTN, unknown baseline renal status (crt 1.0 to 1.3 in 2017 ) who was admitted on 02/25/2019 with AKI (vs chronic)  in the setting of multiple recent renal insults (NSAIDS/ARB) and suspicion for multiple myeloma  Assessment/Plan: 1. AKI- recent NSAIDS and ARB. urinalysis pretty bland- 6-10 WBC but with trace leuks and neg nitrite, urine culture not remarkable for treatment.  Renal ultrasound pretty unremarkable.  Protein to crt ratio 1.8-  30 on the dip.  SPEP  positive in the setting of anemia, hypercalcemia and hyperproteinemia.  Previously Good UOP and crt trending down albeit slowly  - lower UOP and crt up slightly today but still no indications for dialysis- decadron may inc both BUN and creatinine 2. Hypercalcemia - improving some with IVF-- no exogenous calcium- hoping decadron will improve   3. Anemia- worsening with IVF- iron stores low , will replete and gave one time dose of darbe - drifting down still  4. HTN/volume- BP reasonable on norvasc 10 and PRN hydralazine - decreased IVF since tank was full-- now saline lock.  Echo looked reasonable- cards not recommending anything specific.  Wil watch UOP- yest was first day it was down, if stays down may want to challenge with lasix  5. Hypokalemia- repleted in past - better today  6.  SPEP positive- likely diagnosis of multiple myeloma-   Work up per onc once cardiac issues settled- started on decadron  [name] A [name]    Labs: Basic Metabolic Panel: Recent Labs  Lab 02/28/19 0443 03/01/19 0258 03/02/19 0300  NA 139 137 137  K 3.8 4.1 4.2  CL 108 105 103  CO2 22 20* 21*  GLUCOSE 91 158* 137*  BUN 42* 43* 57*  CREATININE 4.03* 3.97* 4.15*  CALCIUM 10.0 9.9 9.8  PHOS 3.7 4.9* 3.9   Liver Function Tests: Recent Labs  Lab 02/25/19 1103 02/26/19 0353 02/28/19 0443 03/01/19 0258 03/02/19 0300  AST 18 18  --   --   --   ALT 12 13  --   --   --   ALKPHOS 30* 26*  --   --   --   BILITOT 0.8 0.3  --   --   --   PROT 10.7* 9.4*  --   --   --   ALBUMIN 3.0* 2.5* 2.5* 2.4* 2.4*   Recent Labs  Lab 02/25/19 1103  LIPASE 26   No results for input(s): AMMONIA in the last 168 hours. CBC: Recent Labs  Lab 02/25/19 1103 02/25/19 1511 02/26/19 0353 02/28/19 0443 03/02/19 0300  WBC 4.4 5.3 4.7 5.0 7.6  NEUTROABS 2.5  --   --   --  6.1  HGB 8.6* 8.3* 7.8* 7.7* 6.8*  HCT 26.4* 25.7* 25.6* 24.3* 21.2*  MCV 91.0 90.5 93.4 93.5 91.4  PLT 181 191 184 164 162   Cardiac Enzymes: Recent Labs  Lab 02/28/19 1000 02/28/19 1504 02/28/19 2118  TROPONINI 0.12* 0.15* 0.20*   CBG: Recent Labs  Lab 03/01/19 0755 03/01/19 1156 03/01/19 1744 03/01/19 2109 03/02/19 0750  GLUCAP 113* 239* 135* 161* 126*    Iron Studies:  Recent Labs    02/28/19 0443  IRON 61  TIBC 215*  FERRITIN 180   Studies/Results: Dg Chest 2 View  Result Date: 03/01/2019 CLINICAL DATA:  Hypertension.  Diabetes. EXAM: CHEST - 2 VIEW COMPARISON:  Feb 28, 2019 FINDINGS: Stable cardiomegaly. The hila and mediastinum are unremarkable. Mild atelectasis in the left base. Mild patchy opacity in the right lung, unchanged. No other acute changes. IMPRESSION: Stable patchy opacities in the right lung. No other interval changes. Electronically Signed   By: Dorise Bullion III M.D   On: 03/01/2019 17:04   Dg Chest Port 1 View  Result Date:  02/28/2019 CLINICAL DATA:  Dyspnea. EXAM: PORTABLE CHEST 1 VIEW COMPARISON:  Radiograph of Feb 25, 2019. FINDINGS: Stable cardiomegaly. Atherosclerosis of thoracic aorta is noted. Left-sided pacemaker is unchanged in position. No pneumothorax or pleural effusion is noted. Left lung is clear. Mildly increased right lung opacities are noted concerning for either asymmetric edema or possibly pneumonia. Severe degenerative changes seen involving the right glenohumeral joint. IMPRESSION: Mildly increased right lung opacities are noted concerning for possible asymmetric edema or possibly pneumonia. Aortic Atherosclerosis (ICD10-I70.0). Electronically Signed   By: Marijo Conception M.D.   On: 02/28/2019 12:26   Medications: Infusions: . sodium chloride 10 mL/hr at 03/02/19 0816  . ferumoxytol Stopped (02/28/19 1534)    Scheduled Medications: . allopurinol  100 mg Oral Daily  . amLODipine  10 mg Oral Daily  . bisacodyl  10 mg Rectal Once  . Chlorhexidine Gluconate Cloth  6 each Topical Daily  . dexamethasone  12 mg Intravenous Daily  . dorzolamide  1 drop Left Eye QPM  . fluorometholone  1 drop Right Eye TID  . heparin  5,000 Units Subcutaneous Q8H  . insulin aspart  0-9 Units Subcutaneous TID WC  . isosorbide-hydrALAZINE  1 tablet Oral TID  . latanoprost  1 drop Left Eye QHS  . mouth rinse  15 mL Mouth Rinse BID  . pantoprazole (PROTONIX) IV  40 mg Intravenous Q24H  . pregabalin  50 mg Oral Daily    have reviewed scheduled and prn medications.  Physical Exam: General: more alert than yesterday, engaged in her health,  paying attention to that crt number  Heart: RRR Lungs: mostly clear- Abdomen: soft, non tender Extremities: no edema     03/02/2019,11:35 AM  LOS: 4 days

## 2019-03-02 NOTE — Progress Notes (Signed)
Subjective:  Some atypical pinching in chest Explained to nurse how to contact fellow on call   Objective:  Vitals:   03/02/19 0400 03/02/19 0500 03/02/19 0600 03/02/19 0700  BP: (!) 145/47 (!) 129/47 (!) 133/50 (!) 140/55  Pulse: 70 70 70 72  Resp: 17 17 16 16   Temp: 98.6 F (37 C)     TempSrc: Axillary     SpO2: 100% 99% 99% 99%  Weight:      Height:        Intake/Output from previous day:  Intake/Output Summary (Last 24 hours) at 03/02/2019 0754 Last data filed at 03/02/2019 0734 Gross per 24 hour  Intake 347.7 ml  Output 575 ml  Net -227.3 ml    Physical Exam: Affect appropriate Elderly black female  HEENT: normal Neck supple with no adenopathy JVP normal no bruits no thyromegaly Lungs clear with no wheezing and good diaphragmatic motion Heart:  S1/S2 no murmur, no rub, gallop or click PMI normal  Pacer under left clavicle  Abdomen: benighn, BS positve, no tenderness, no AAA no bruit.  No HSM or HJR Distal pulses intact with no bruits No edema Neuro non-focal Skin warm and dry No muscular weakness   Lab Results: Basic Metabolic Panel: Recent Labs    03/01/19 0258 03/02/19 0300  NA 137 137  K 4.1 4.2  CL 105 103  CO2 20* 21*  GLUCOSE 158* 137*  BUN 43* 57*  CREATININE 3.97* 4.15*  CALCIUM 9.9 9.8  PHOS 4.9* PENDING   Liver Function Tests: Recent Labs    03/01/19 0258 03/02/19 0300  ALBUMIN 2.4* 2.4*   No results for input(s): LIPASE, AMYLASE in the last 72 hours. CBC: Recent Labs    02/28/19 0443 03/02/19 0300  WBC 5.0 7.6  NEUTROABS  --  6.1  HGB 7.7* 6.8*  HCT 24.3* 21.2*  MCV 93.5 91.4  PLT 164 162   Cardiac Enzymes: Recent Labs    02/28/19 1000 02/28/19 1504 02/28/19 2118  TROPONINI 0.12* 0.15* 0.20*    Anemia Panel: Recent Labs    02/28/19 0443  FERRITIN 180  TIBC 215*  IRON 61    Imaging: CXR with patchy RUL infiltrate   Cardiac Studies:  ECG: AV pacing    Telemetry:  AV pacing   Echo: EF 60-65%   Medications:   . sodium chloride   Intravenous Once  . allopurinol  100 mg Oral Daily  . amLODipine  10 mg Oral Daily  . bisacodyl  10 mg Rectal Once  . Chlorhexidine Gluconate Cloth  6 each Topical Daily  . dexamethasone  12 mg Intravenous Daily  . dorzolamide  1 drop Left Eye QPM  . fluorometholone  1 drop Right Eye TID  . heparin  5,000 Units Subcutaneous Q8H  . insulin aspart  0-9 Units Subcutaneous TID WC  . latanoprost  1 drop Left Eye QHS  . mouth rinse  15 mL Mouth Rinse BID  . pantoprazole (PROTONIX) IV  40 mg Intravenous Q24H  . pregabalin  50 mg Oral Daily     . sodium chloride Stopped (02/28/19 2046)  . ferumoxytol Stopped (02/28/19 1534)  . nitroGLYCERIN 15 mcg/min (03/02/19 0700)    Assessment/Plan:  Pulmonary Edema ? Vs atypical pneumonia repeat CXR no CHF patchy RUL infiltrate TTE with normal EF and no significant valve disease lasix as needed to keep I/O even  A/CRF:  Per nephrology concerning that Cr has not improved with hydration ARB held per nephrology no  indicatio for dialysis   Anemia:  Consider [place] in setting of renal failure will need transfusion with lasix as Hb is 6.8 this am   Elevated Troponin: no chest pain ECG paced not likely significant in setting of renal failure TTE with normal EF and no RWMA;s no further w/u indicated  PPM:  Normal AV pacing f/u Allred  Biggest issues appear non cardiac with progressive anemia and renal failure leading to volume overload  Will sign off  Outpatient f/u for PPM with Allred  Jenkins Rouge 03/02/2019, 7:54 AM

## 2019-03-02 NOTE — Progress Notes (Addendum)
CRITICAL VALUE ALERT  Critical Value:  Hgb 6.8  Date & Time Notied:  03/02/19  Provider Notified:  Silas [place] NP  Orders Received/Actions taken:transfuse 1 UPRBC

## 2019-03-03 ENCOUNTER — Other Ambulatory Visit: Payer: Self-pay | Admitting: Hematology and Oncology

## 2019-03-03 ENCOUNTER — Inpatient Hospital Stay (HOSPITAL_COMMUNITY): Payer: Medicare HMO

## 2019-03-03 DIAGNOSIS — C9000 Multiple myeloma not having achieved remission: Secondary | ICD-10-CM

## 2019-03-03 LAB — UPEP/UIFE/LIGHT CHAINS/TP, 24-HR UR
% BETA, Urine: 80.1 %
ALPHA 1 URINE: 2.8 %
Albumin, U: 10.7 %
Alpha 2, Urine: 4.9 %
Free Kappa Lt Chains,Ur: 3268.06 mg/L — ABNORMAL HIGH (ref 0.63–113.79)
Free Kappa/Lambda Ratio: 835.82 — ABNORMAL HIGH (ref 1.03–31.76)
Free Lambda Lt Chains,Ur: 3.91 mg/L (ref 0.47–11.77)
GAMMA GLOBULIN URINE: 1.5 %
M-SPIKE %, Urine: 63.4 % — ABNORMAL HIGH
M-Spike, Mg/24 Hr: 1210 mg/24 hr — ABNORMAL HIGH
Total Protein, Urine-Ur/day: 1909 mg/24 hr — ABNORMAL HIGH (ref 30–150)
Total Protein, Urine: 109.1 mg/dL
Total Volume: 1750

## 2019-03-03 LAB — RENAL FUNCTION PANEL
Albumin: 2.5 g/dL — ABNORMAL LOW (ref 3.5–5.0)
Anion gap: 12 (ref 5–15)
BUN: 72 mg/dL — ABNORMAL HIGH (ref 8–23)
CO2: 19 mmol/L — ABNORMAL LOW (ref 22–32)
Calcium: 9.2 mg/dL (ref 8.9–10.3)
Chloride: 103 mmol/L (ref 98–111)
Creatinine, Ser: 4.16 mg/dL — ABNORMAL HIGH (ref 0.44–1.00)
GFR calc Af Amer: 11 mL/min — ABNORMAL LOW (ref >60)
GFR calc non Af Amer: 9 mL/min — ABNORMAL LOW (ref >60)
Glucose, Bld: 129 mg/dL — ABNORMAL HIGH (ref 70–99)
Phosphorus: 3.6 mg/dL (ref 2.5–4.6)
Potassium: 4.2 mmol/L (ref 3.5–5.1)
Sodium: 134 mmol/L — ABNORMAL LOW (ref 135–145)

## 2019-03-03 LAB — GLUCOSE, CAPILLARY
Glucose-Capillary: 100 mg/dL — ABNORMAL HIGH (ref 70–99)
Glucose-Capillary: 166 mg/dL — ABNORMAL HIGH (ref 70–99)
Glucose-Capillary: 144 mg/dL — ABNORMAL HIGH (ref 70–99)
Glucose-Capillary: 148 mg/dL — ABNORMAL HIGH (ref 70–99)

## 2019-03-03 LAB — TYPE AND SCREEN
ABO/RH(D): A POS
Antibody Screen: NEGATIVE
Unit division: 0

## 2019-03-03 LAB — CBC
HCT: 23.3 % — ABNORMAL LOW (ref 36.0–46.0)
Hemoglobin: 7.6 g/dL — ABNORMAL LOW (ref 12.0–15.0)
MCH: 28.9 pg (ref 26.0–34.0)
MCHC: 32.6 g/dL (ref 30.0–36.0)
MCV: 88.6 fL (ref 80.0–100.0)
Platelets: 157 10*3/uL (ref 150–400)
RBC: 2.63 MIL/uL — ABNORMAL LOW (ref 3.87–5.11)
RDW: 16.5 % — ABNORMAL HIGH (ref 11.5–15.5)
WBC: 6.8 10*3/uL (ref 4.0–10.5)
nRBC: 0 % (ref 0.0–0.2)

## 2019-03-03 LAB — BPAM RBC
Blood Product Expiration Date: 202006062359
ISSUE DATE / TIME: 202005170806
Unit Type and Rh: 6200

## 2019-03-03 LAB — URIC ACID: Uric Acid, Serum: 7.5 mg/dL — ABNORMAL HIGH (ref 2.5–7.1)

## 2019-03-03 LAB — ABO/RH: ABO/RH(D): A POS

## 2019-03-03 MED ORDER — PANTOPRAZOLE SODIUM 40 MG PO TBEC
40.0000 mg | DELAYED_RELEASE_TABLET | Freq: Every day | ORAL | Status: DC
  Administered 2019-03-04 – 2019-03-11 (×8): 40 mg via ORAL
  Filled 2019-03-03 (×8): qty 1

## 2019-03-03 MED ORDER — SODIUM CHLORIDE 0.9 % IV SOLN
3.0000 mg | Freq: Once | INTRAVENOUS | Status: AC
  Administered 2019-03-03: 3 mg via INTRAVENOUS
  Filled 2019-03-03: qty 2

## 2019-03-03 MED ORDER — FUROSEMIDE 10 MG/ML IJ SOLN
60.0000 mg | Freq: Once | INTRAMUSCULAR | Status: AC
  Administered 2019-03-03: 60 mg via INTRAVENOUS
  Filled 2019-03-03: qty 6

## 2019-03-03 NOTE — TOC Initial Note (Signed)
Transition of Care [facility]) - Initial/Assessment Note    Patient Details  Name: [name] MRN: [id number] Date of Birth: Jun 26, 1930  Transition of Care (TOC) CM/SW Contact:    Joaquin Courts, RN Phone Number: 03/03/2019, 11:15 AM  Clinical Narrative:                   Expected Discharge Plan: Home/Self Care Barriers to Discharge: Continued Medical Work up   Patient Goals and CMS Choice        Expected Discharge Plan and Services Expected Discharge Plan: Home/Self Care       Living arrangements for the past 2 months: Single Family Home Expected Discharge Date: (unknown)                                    Prior Living Arrangements/Services Living arrangements for the past 2 months: Single Family Home Lives with:: Spouse, Adult Children Patient language and need for interpreter reviewed:: Yes Do you feel safe going back to the place where you live?: Yes      Need for Family Participation in Patient Care: Yes (Comment) Care giver support system in place?: Yes (comment)   Criminal Activity/Legal Involvement Pertinent to Current Situation/Hospitalization: No - Comment as needed  Activities of Daily Living Home Assistive Devices/Equipment: Cane (specify quad or straight), CBG Meter, CPAP, Eyeglasses(single point cane, upper/lower partial plates) ADL Screening (condition at time of admission) Patient's cognitive ability adequate to safely complete daily activities?: Yes Is the patient deaf or have difficulty hearing?: No Does the patient have difficulty seeing, even when wearing glasses/contacts?: No Does the patient have difficulty concentrating, remembering, or making decisions?: No Patient able to express need for assistance with ADLs?: Yes Does the patient have difficulty dressing or bathing?: No Independently performs ADLs?: Yes (appropriate for developmental age) Does the patient have difficulty walking or climbing stairs?: Yes(secondary to  weakness) Weakness of Legs: Both Weakness of Arms/Hands: None  Permission Sought/Granted                  Emotional Assessment Appearance:: Appears stated age     Orientation: : Oriented to Self, Oriented to Place, Oriented to  Time, Oriented to Situation Alcohol / Substance Use: Never Used Psych Involvement: No (comment)  Admission diagnosis:  Cough [R05] AKI (acute kidney injury) ([place]) [N17.9] Acute renal failure, unspecified acute renal failure type ([place]) [N17.9] Anemia, unspecified type [D64.9] Patient Active Problem List   Diagnosis Date Noted  . Multiple myeloma not having achieved remission ([place]) 03/03/2019  . AKI (acute kidney injury) ([place]) 02/25/2019  . Hypokalemia 02/25/2019  . Acute renal failure ([place])   . Anemia   . Chest pain 03/09/2015  . Shingles 03/09/2015  . Sick sinus syndrome ([place]) 02/03/2015  . Shortness of breath 02/01/2014  . Fatigue 02/01/2014  . BENIGN PAROXYSMAL POSITIONAL VERTIGO 04/06/2010  . BRADYCARDIA 01/27/2009  . Diabetes mellitus ([place]) 02/24/2008  . Obstructive sleep apnea 02/24/2008  . 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 Essential hypertension 02/24/2008  . ALLERGIC RHINITIS 02/24/2008  . CAROTID BRUIT 02/24/2008   PCP:  Lucianne Lei, MD Pharmacy:   [id number] [id number]: [phone]: [phone]    Social Determinants of Health (SDOH) Interventions    Readmission Risk Interventions Readmission Risk Prevention Plan 03/03/2019  Transportation Screening Complete  PCP or Specialist Appt  within 3-5 Days Not Complete  Not Complete comments not yet ready for d/c  HRI or [place] Not Complete  HRI or Home Care Consult comments no needs at this time  Social Work Consult for [place] Planning/Counseling Not Complete  SW consult not completed comments no needs at this time  Palliative Care Screening Not Applicable   Medication Review (RN Care Manager) Complete  Some recent data might be hidden

## 2019-03-03 NOTE — Progress Notes (Addendum)
[facility]  PROGRESS NOTE  [name] RCV:[id number] DOB: 1930-05-04 DOA: 02/25/2019 PCP: Lucianne Lei, MD   Brief HPI:   85 year old female with a history of hypertension, diabetes mellitus, bradycardia, status post pacemaker placement came to hospital with abnormal labs and fatigue.  Patient was seen by her PCP had labs drawn which showed that renal function was abnormal.  Patient has been taking Motrin off and on for past 1 month for chronic back pain.  Patient has HCTZ mentioning med rec list but has not been taking that for quite a while.  She does take Micardis for hypertension.  Patient baseline creatinine was around 1.0 per labs shown in epic in 2017.  COVID-19 test is negative    Subjective   Patient seen and examined, denies shortness of breath.  Renal function is stable.  Plan for bone marrow biopsy today.   Assessment/Plan:     1. Acute kidney injury-patient presented with creatinine of 4.46 on admission, last creatinine was 1.0 and 2017.  Likely multifactorial from Motrin use, poor p.o. intake, ARB, patient diagnosed with multiple myeloma.  Renal ultrasound obtained showed no hydronephrosis. Started on IV normal saline.  Nephrology following.  Creatinine today has 4.16.   2. Acute pulmonary edema-patient devloped pulmonary edema this morning likely from fluid overload, which patient received for acute kidney injury.  Patient was started on nitroglycerin gtt and transferred to stepdown.  Also had mild elevation of troponin 0.1-2.20.  Cardiology was consulted.  No new recommendations.  Cardiology has signed off.  Patient is on isosorbide /hydralazine 20/37.5 mg 3 times daily.  1 dose of Lasix given per nephrology today.  3. Multiple myeloma-new diagnosis, patient presented with AKI, hypercalcemia, hypoproteinemia.  SPEP labs showed M spike protein with 4.1 g/dL.  Urinary kappa free light chain 9676.  Kappa lambda light chain ratio 1209.58.  Called and discussed with  oncologist, patient most likely has multiple myeloma.  No bone marrow biopsy recommended at this time due to COVID-19.  Patient has been started on IV Decadron per oncology.  Bone marrow biopsy today.  4. Hypercalcemia-today calcium is 9.2, corrected calcium is 11.  It has improved after starting on IV hydration with normal saline.  IV fluids are currently hold due to development of pulmonary edema as above.  5. Hypertension-blood pressure well controlled on Norvasc 10 mg daily, Micardis 80 mg daily.  She does not take HCTZ.  Will hold Micardis due to worsening renal function.  Continue amlodipine.  6. Diabetes mellitus type 2-continue sliding scale insulin with NovoLog.  CBGs well controlled.  7. Gout-stable, continue allopurinol.  8. 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  9. Anemia of chronic disease-patient's hemoglobin is 7.6 after 1 unit PRBC..  10. Hypokalemia-replete     CBG: Recent Labs  Lab 03/02/19 1137 03/02/19 1640 03/02/19 2152 03/03/19 0757 03/03/19 1229  GLUCAP 131* 147* 146* 100* 166*    CBC: Recent Labs  Lab 02/25/19 1103 02/25/19 1511 02/26/19 0353 02/28/19 0443 03/02/19 0300 03/02/19 1446 03/03/19 0239  WBC 4.4 5.3 4.7 5.0 7.6  --  6.8  NEUTROABS 2.5  --   --   --  6.1  --   --   HGB 8.6* 8.3* 7.8* 7.7* 6.8* 8.2* 7.6*  HCT 26.4* 25.7* 25.6* 24.3* 21.2* 24.9* 23.3*  MCV 91.0 90.5 93.4 93.5 91.4  --  88.6  PLT 181 191 184 164 162  --  102    Basic Metabolic Panel: Recent Labs  Lab 02/25/19 1103  02/27/19 0438 02/28/19 0443 03/01/19 0258 03/02/19 0300 03/03/19 0239  NA 137   < > 139 139 137 137 134*  K 3.1*   < > 3.2* 3.8 4.1 4.2 4.2  CL 101   < > 108 108 105 103 103  CO2 23   < > 22 22 20* 21* 19*  GLUCOSE 127*   < > 84 91 158* 137* 129*  BUN 50*   < > 43* 42* 43* 57* 72*  CREATININE 4.46*   < > 4.11* 4.03* 3.97* 4.15* 4.16*  CALCIUM 10.8*   < > 9.6 10.0 9.9 9.8 9.2  MG 2.3  --   --   --   --   --   --    PHOS  --   --   --  3.7 4.9* 3.9 3.6   < > = values in this interval not displayed.     DVT prophylaxis: Heparin  Code Status: Full code  Family Communication: No family at bedside  Disposition Plan: likely home when medically ready for discharge     Consultants:  None  Procedures:  None   Antibiotics:   Anti-infectives (From admission, onward)   None       Objective   Vitals:   03/03/19 1100 03/03/19 1130 03/03/19 1200 03/03/19 1400  BP: (!) 141/52  (!) 129/44 (!) 130/53  Pulse:  70 70 73  Resp: 14 19 17 18   Temp:  98 F (36.7 C)    TempSrc:  Oral    SpO2:  98% 98% 98%  Weight:      Height:        Intake/Output Summary (Last 24 hours) at 03/03/2019 1622 Last data filed at 03/03/2019 1545 Gross per 24 hour  Intake 170.03 ml  Output 700 ml  Net -529.97 ml   Filed Weights   02/25/19 1012  Weight: 81.2 kg     Physical Examination:  General-appears in no acute distress Heart-S1-S2, regular, no murmur auscultated  Lungs- Bibasilar crackles auscultated Abdomen-soft, nontender, no organomegaly Extremities-no edema in the lower extremities Neuro-alert, oriented x3, no focal deficit noted  Data Reviewed: I have personally reviewed following labs and imaging studies   Recent Results (from the past 240 hour(s))  SARS Coronavirus 2 (CEPHEID - Performed in [place] hospital lab), Hosp Order     Status: None   Collection Time: 02/25/19 11:58 AM  Result Value Ref Range Status   SARS Coronavirus 2 NEGATIVE NEGATIVE Final    Comment: (NOTE) If result is NEGATIVE SARS-CoV-2 target nucleic acids are NOT DETECTED. The SARS-CoV-2 RNA is generally detectable in upper and lower  respiratory specimens during the acute phase of infection. The lowest  concentration of SARS-CoV-2 viral copies this assay can detect is 250  copies / mL. A negative result does not preclude SARS-CoV-2 infection  and should not be used as the sole basis for treatment or other   patient management decisions.  A negative result may occur with  improper specimen collection / handling, submission of specimen other  than nasopharyngeal swab, presence of viral mutation(s) within the  areas targeted by this assay, and inadequate number of viral copies  (<250 copies / mL). A negative result must be combined with clinical  observations, patient history, and epidemiological information. If result is POSITIVE SARS-CoV-2 target nucleic acids are DETECTED. The SARS-CoV-2 RNA is generally detectable in upper and lower  respiratory specimens dur ing the acute phase of infection.  Positive  results are indicative of active infection with SARS-CoV-2.  Clinical  correlation with patient history and other diagnostic information is  necessary to determine patient infection status.  Positive results do  not rule out bacterial infection or co-infection with other viruses. If result is PRESUMPTIVE POSTIVE SARS-CoV-2 nucleic acids MAY BE PRESENT.   A presumptive positive result was obtained on the submitted specimen  and confirmed on repeat testing.  While 2019 novel coronavirus  (SARS-CoV-2) nucleic acids may be present in the submitted sample  additional confirmatory testing may be necessary for epidemiological  and / or clinical management purposes  to differentiate between  SARS-CoV-2 and other Sarbecovirus currently known to infect humans.  If clinically indicated additional testing with an alternate test  methodology [phone]) is advised. The SARS-CoV-2 RNA is generally  detectable in upper and lower respiratory sp ecimens during the acute  phase of infection. The expected result is Negative. Fact Sheet for Patients:  StrictlyIdeas.no Fact Sheet for Healthcare Providers: BankingDealers.co.za This test is not yet approved or cleared by the [place] FDA and has been authorized for detection and/or diagnosis of SARS-CoV-2  by FDA under an Emergency Use Authorization (EUA).  This EUA will remain in effect (meaning this test can be used) for the duration of the COVID-19 declaration under Section 564(b)(1) of the Act, 21 U.S.C. section 360bbb-3(b)(1), unless the authorization is terminated or revoked sooner. Performed at [facility], [place] [address] [place], [place] [address]   Culture, Urine     Status: Abnormal   Collection Time: 02/26/19  7:02 PM  Result Value Ref Range Status   Specimen Description   Final    URINE, CLEAN CATCH Performed at [facility], [place] [address] [place], [place] [address]    Special Requests   Final    NONE Performed at [facility], [place] [address], [place], [place] [address]    Culture (A)  Final    50,000 COLONIES/mL LACTOBACILLUS SPECIES Standardized susceptibility testing for this organism is not available. Performed at [facility], [place] [address], [place], [place] [address]    Report Status 02/27/2019 FINAL  Final  MRSA PCR Screening     Status: None   Collection Time: 02/28/19 12:45 PM  Result Value Ref Range Status   MRSA by PCR NEGATIVE NEGATIVE Final    Comment:        The GeneXpert MRSA Assay (FDA approved for NASAL specimens only), is one component of a comprehensive MRSA colonization surveillance program. It is not intended to diagnose MRSA infection nor to guide or monitor treatment for MRSA infections. Performed at [facility], [place] [address] [place], [place] [address]      Liver Function Tests: Recent Labs  Lab 02/25/19 1103 02/26/19 0353 02/28/19 0443 03/01/19 0258 03/02/19 0300 03/03/19 0239  AST 18 18  --   --   --   --   ALT 12 13  --   --   --   --   ALKPHOS 30* 26*  --   --   --   --   BILITOT 0.8 0.3  --   --   --   --   PROT 10.7* 9.4*  --   --   --   --   ALBUMIN 3.0* 2.5* 2.5* 2.4* 2.4* 2.5*   Recent Labs  Lab 02/25/19 1103  LIPASE 26       Studies: No results found.  Scheduled Meds: . allopurinol  100 mg Oral Daily  . amLODipine  10 mg Oral Daily  . Chlorhexidine Gluconate Cloth  6 each Topical Daily  . dorzolamide  1 drop Left Eye QPM  . fluorometholone  1 drop Right Eye TID  . heparin  5,000 Units Subcutaneous Q8H  . insulin aspart  0-9 Units Subcutaneous TID WC  . isosorbide-hydrALAZINE  1 tablet Oral TID  . latanoprost  1 drop Left Eye QHS  . mouth rinse  15 mL Mouth Rinse BID  . [START ON 03/04/2019] pantoprazole  40 mg Oral Daily  . pregabalin  50 mg Oral Daily    Admission status: Inpatient: Based on patients clinical presentation and evaluation of above clinical data, I have made determination that patient meets Inpatient criteria at this time.  Time spent: 25 min  [facility] Pager [phone]. If 7PM-7AM, please contact night-coverage at [email or web address], Office  [phone]  password [place]  03/03/2019, 4:22 PM  LOS: 5 days

## 2019-03-03 NOTE — Progress Notes (Signed)
Nephrology Progress Note   Subjective:  Strict ins/outs not available.  350 mL UOP + 4 unquantified voids on 5/17.  She is going to start saving her urine - spoke with RN as well.  We discussed her renal failure.  Hem/onc is here to discuss plans for BM biopsy and primary team is here to eval as well.   Review of systems:   Nausea is better  Shortness of breath with exertion; denies shortness of breath at rest  Knee pain  Gets up with assistance   Objective Vital signs in last 24 hours: Vitals:   03/03/19 0100 03/03/19 0200 03/03/19 0400 03/03/19 0600  BP: (!) 125/54 (!) 144/75 (!) 115/47 (!) 121/51  Pulse: 73 71    Resp: 12 (!) 22 13 12   Temp:   97.7 F (36.5 C)   TempSrc:   Oral   SpO2: 93% 96% 97%   Weight:      Height:       Weight change:   Intake/Output Summary (Last 24 hours) at 03/03/2019 0749 Last data filed at 03/02/2019 1800 Gross per 24 hour  Intake 682.58 ml  Output 200 ml  Net 482.58 ml    Assessment/ Plan: Pt is a 85 y.o. yo female with DM, HTN, unknown baseline renal status (crt 1.0 to 1.3 in 2017 ) who was admitted on 02/25/2019 with AKI (vs chronic)  in the setting of multiple recent renal insults (NSAIDS/ARB) and suspicion for multiple myeloma   Assessment/Plan: 1. AKI-  - Secondary to presumed multiple myeloma.  Also with recent pre-renal insult of prolonged n/v; hx NSAIDS and ARB.  Her n/v may have been symptoms of progressive renal failure. urinalysis pretty bland- 6-10 WBC but with trace leuks and neg nitrite, urine culture not remarkable for treatment.  Renal ultrasound pretty unremarkable.  Protein to crt ratio 1.8-  30 on the dip.  SPEP  positive in the setting of anemia, hypercalcemia and hyperproteinemia and presumed myeloma.  - Renal function essentially unchanged s/p IV fluids and holding RAAS blockade.  Hopeful for gradual improvement over time with treatment of myeloma - No acute indication for dialysis however she would likely need HD should her  renal function remain unchanged.  Note rising BUN which may be 2/2 decadron  - Avoid NSAID's and ARB's/Ace inhibitors  2.  SPEP positive- likely diagnosis of multiple myeloma-  Hem/onc is coordinating a bone marrow biopsy and skeletal survey.  On decadron.  On allopurinol.  Continue monitoring uric acid levels - they report plans for rasburicase x 1  3. Anemia- secondary to mutliple myeloma and likely component of renal failure, as well. worsening with IVF with iron deficiency.  Replete iron stores and s/p one-time dose of darbe.  Improved s/p PRBC's  4. Hypercalcemia - setting of multiple myeloma.  Improving     5. HTN/volume-  Improving control. Echo looked reasonable- cards not recommending anything specific.  Avoid hypotension. Lasix IV once now   6. Proteinuria  - Secondary to presumed multiple myeloma - Off of RAAS blockade with AKI   7. Metabolic acidosis - secondary in part to renal failure.  Lasix IV once and would need bicarb if persistent  Claudia Desanctis    Labs: Basic Metabolic Panel: Recent Labs  Lab 03/01/19 0258 03/02/19 0300 03/03/19 0239  NA 137 137 134*  K 4.1 4.2 4.2  CL 105 103 103  CO2 20* 21* 19*  GLUCOSE 158* 137* 129*  BUN 43* 57* 72*  CREATININE  3.97* 4.15* 4.16*  CALCIUM 9.9 9.8 9.2  PHOS 4.9* 3.9 3.6   Liver Function Tests: Recent Labs  Lab 02/25/19 1103 02/26/19 0353  03/01/19 0258 03/02/19 0300 03/03/19 0239  AST 18 18  --   --   --   --   ALT 12 13  --   --   --   --   ALKPHOS 30* 26*  --   --   --   --   BILITOT 0.8 0.3  --   --   --   --   PROT 10.7* 9.4*  --   --   --   --   ALBUMIN 3.0* 2.5*   < > 2.4* 2.4* 2.5*   < > = values in this interval not displayed.   Recent Labs  Lab 02/25/19 1103  LIPASE 26   No results for input(s): AMMONIA in the last 168 hours. CBC: Recent Labs  Lab 02/25/19 1103 02/25/19 1511 02/26/19 0353 02/28/19 0443 03/02/19 0300 03/02/19 1446 03/03/19 0239  WBC 4.4 5.3 4.7 5.0 7.6  --  6.8   NEUTROABS 2.5  --   --   --  6.1  --   --   HGB 8.6* 8.3* 7.8* 7.7* 6.8* 8.2* 7.6*  HCT 26.4* 25.7* 25.6* 24.3* 21.2* 24.9* 23.3*  MCV 91.0 90.5 93.4 93.5 91.4  --  88.6  PLT 181 191 184 164 162  --  157   Cardiac Enzymes: Recent Labs  Lab 02/28/19 1000 02/28/19 1504 02/28/19 2118  TROPONINI 0.12* 0.15* 0.20*   CBG: Recent Labs  Lab 03/01/19 2109 03/02/19 0750 03/02/19 1137 03/02/19 1640 03/02/19 2152  GLUCAP 161* 126* 131* 147* 146*    Iron Studies:  No results for input(s): IRON, TIBC, TRANSFERRIN, FERRITIN in the last 72 hours. Studies/Results: Dg Chest 2 View  Result Date: 03/01/2019 CLINICAL DATA:  Hypertension.  Diabetes. EXAM: CHEST - 2 VIEW COMPARISON:  Feb 28, 2019 FINDINGS: Stable cardiomegaly. The hila and mediastinum are unremarkable. Mild atelectasis in the left base. Mild patchy opacity in the right lung, unchanged. No other acute changes. IMPRESSION: Stable patchy opacities in the right lung. No other interval changes. Electronically Signed   By: Dorise Bullion III M.D   On: 03/01/2019 17:04   Medications: Infusions: . sodium chloride Stopped (03/03/19 0440)  . ferumoxytol Stopped (02/28/19 1534)    Scheduled Medications: . allopurinol  100 mg Oral Daily  . amLODipine  10 mg Oral Daily  . bisacodyl  10 mg Rectal Once  . Chlorhexidine Gluconate Cloth  6 each Topical Daily  . dexamethasone  12 mg Intravenous Daily  . dorzolamide  1 drop Left Eye QPM  . fluorometholone  1 drop Right Eye TID  . heparin  5,000 Units Subcutaneous Q8H  . insulin aspart  0-9 Units Subcutaneous TID WC  . isosorbide-hydrALAZINE  1 tablet Oral TID  . latanoprost  1 drop Left Eye QHS  . mouth rinse  15 mL Mouth Rinse BID  . pantoprazole (PROTONIX) IV  40 mg Intravenous Q24H  . pregabalin  50 mg Oral Daily    have reviewed scheduled and prn medications.  Physical Exam: General: elderly female in bed in NAD at rest  Heart: S1S2; no rub  Lungs: basilar crackles; unlabored  at rest Abdomen: soft/NT/ND Extremities: no lower extremity edema  Neuro - awake and alert, conversant     03/03/2019,7:49 AM  LOS: 5 days

## 2019-03-03 NOTE — Progress Notes (Signed)
START ON PATHWAY REGIMEN - Multiple Myeloma and Other Plasma Cell Dyscrasias     A cycle is every 21 days:     Bortezomib      Cyclophosphamide      Dexamethasone   **Always confirm dose/schedule in your pharmacy ordering system**  Patient Characteristics: Newly Diagnosed, Transplant Ineligible or Refused, Unknown or Awaiting Test Results R-ISS Staging: III Disease Classification: Newly Diagnosed Is Patient Eligible for Transplant<= Transplant Ineligible or Refused Risk Status: Awaiting Test Results Intent of Therapy: Non-Curative / Palliative Intent, Discussed with Patient

## 2019-03-03 NOTE — Progress Notes (Signed)
The patient is receiving Protonix by the intravenous route.  Based on criteria approved by the Pharmacy and [place], the medication is being converted to the equivalent oral dose form.  These criteria include: -No active GI bleeding -Able to tolerate diet of full liquids (or better) or tube feeding -Able to tolerate other medications by the oral or enteral route  If you have any questions about this conversion, please contact the Pharmacy Department (phone [phone]).  Thank you. Eudelia Bunch, Pharm.D 03/03/2019 3:30 PM

## 2019-03-03 NOTE — Evaluation (Signed)
Physical Therapy Evaluation Patient Details Name: [name] MRN: [id number] DOB: 01-01-1930 Today's Date: 03/03/2019   History of Present Illness  85 yo female admitted with AKI, new diagnosis of multiple myeloma, pulmonary edema. Hx of bil knee surgeries, OA, DM gout neuropathy, SSS, pacemaker anemia  Clinical Impression  On eval, pt required Min assist for mobility. She walked ~60 feet with her straight cane. Pt presents with general weakness, decreased activity tolerance, and impaired gait and balance. Currently, pt is very unsteady when ambulating with her cane. At this time, recommend RW for safe ambulation. Discussed d/c plan-pt plans to return home. Recommend HHPT and home health aide. Will continue to follow and progress activity as tolerated.     Follow Up Recommendations Home health PT;Supervision for mobility/OOB; Home Health Aide    Equipment Recommendations  (4 wheeled rolling walker)    Recommendations for Other Services       Precautions / Restrictions Precautions Precautions: Fall Restrictions Weight Bearing Restrictions: No      Mobility  Bed Mobility Overal bed mobility: Needs Assistance Bed Mobility: Supine to Sit     Supine to sit: Supervision     General bed mobility comments: for safety, lines  Transfers Overall transfer level: Needs assistance Equipment used: Straight cane Transfers: Sit to/from Stand Sit to Stand: Min assist         General transfer comment: Assist to rise, stabilize, control descent. VCs safety, hand placement. Unsteady.   Ambulation/Gait Ambulation/Gait assistance: Min assist Gait Distance (Feet): 60 Feet Assistive device: Straight cane Gait Pattern/deviations: Decreased stride length;Step-through pattern     General Gait Details: Slow gait speed. Assist to stabilize throughout distance. LOB x1. Pt fatigues fairly easily.   Stairs            Wheelchair Mobility    Modified Rankin (Stroke Patients  Only)       Balance Overall balance assessment: Needs assistance         Standing balance support: Single extremity supported Standing balance-Leahy Scale: Poor                               Pertinent Vitals/Pain Pain Assessment: Faces Faces Pain Scale: Hurts even more Pain Location: back, bil knees Pain Descriptors / Indicators: Aching;Sore Pain Intervention(s): Monitored during session    Home Living Family/patient expects to be discharged to:: Private residence Living Arrangements: Spouse/significant other;Children(son is caregiver) Available Help at Discharge: Family Type of Home: House Home Access: Stairs to enter   Technical brewer of Steps: 1+2 Home Layout: Two level;Able to live on main level with bedroom/bathroom Home Equipment: Kasandra Knudsen - single point;Walker - 2 wheels      Prior Function Level of Independence: Independent with assistive device(s)         Comments: using cane     Hand Dominance        Extremity/Trunk Assessment   Upper Extremity Assessment Upper Extremity Assessment: Generalized weakness    Lower Extremity Assessment Lower Extremity Assessment: Generalized weakness    Cervical / Trunk Assessment Cervical / Trunk Assessment: Normal  Communication   Communication: No difficulties  Cognition Arousal/Alertness: Awake/alert Behavior During Therapy: WFL for tasks assessed/performed Overall Cognitive Status: Within Functional Limits for tasks assessed  General Comments      Exercises     Assessment/Plan    PT Assessment Patient needs continued PT services  PT Problem List Decreased strength;Decreased activity tolerance;Decreased balance;Decreased mobility;Decreased knowledge of use of DME       PT Treatment Interventions Gait training;DME instruction;Functional mobility training;Therapeutic activities;Therapeutic exercise;Balance  training;Patient/family education    PT Goals (Current goals can be found in the Care Plan section)  Acute Rehab PT Goals Patient Stated Goal: get stronger PT Goal Formulation: With patient Time For Goal Achievement: 03/17/19 Potential to Achieve Goals: Fair    Frequency Min 3X/week   Barriers to discharge        Co-evaluation               AM-PAC PT "6 Clicks" Mobility  Outcome Measure Help needed turning from your back to your side while in a flat bed without using bedrails?: A Little Help needed moving from lying on your back to sitting on the side of a flat bed without using bedrails?: A Little Help needed moving to and from a bed to a chair (including a wheelchair)?: A Little Help needed standing up from a chair using your arms (e.g., wheelchair or bedside chair)?: A Little Help needed to walk in hospital room?: A Little Help needed climbing 3-5 steps with a railing? : A Little 6 Click Score: 18    End of Session Equipment Utilized During Treatment: Gait belt Activity Tolerance: Patient tolerated treatment well Patient left: in chair;with call bell/phone within reach   PT Visit Diagnosis: Muscle weakness (generalized) (M62.81);Unsteadiness on feet (R26.81)    Time: 5834-6219 PT Time Calculation (min) (ACUTE ONLY): 19 min   Charges:   PT Evaluation $PT Eval Moderate Complexity: [place], PT Acute Rehabilitation Services Pager: [phone] Office: [phone]

## 2019-03-03 NOTE — Progress Notes (Signed)
[name]   DOB:August 01, 1930   YT#:[id number]    ASSESSMENT & PLAN:  #1Elevated SPEP and light chainsconsistent with multiple myeloma  Her current work-up is incomplete; recent labs confirmed IgG kappa myeloma I would like to order skeletal survey to rule out impeding bone fractures I have scheduled bedside bone marrow biopsy under local anesthesia only.  I explained the procedure to the patient and she agreed  I recommend short-term corticosteroid therapy to stabilize her multiple myeloma and to treat her hypercalcemia; today is day 4 of steroid treatment After the bone marrow biopsy, I can start her on weekly Velcade tomorrow (if she is stable) I briefly explained to family members about side effects of chemotherapy. I will arrange for chemo education nurse to stop by tomorrow and administer chemotherapy (if she is stable) I will arrange with inpatient pharmacy to get her chemotherapy set up to start tomorrow. Due to her multiple comorbidities, I favor Velcade q. 7 days instead of days 1, 4, 7 and 11  #2 Anemia Likely due to her underlying bone marrow disorder She was transfused on 03/02/2019 Monitor closely, recheck tomorrow  #3 Malignant hypercalcemia, resolving She appears to be responding to steroids and malignant help her calcium is resolving We will prescribe day 4 of steroids today  #4 Acute renal failure She has acute on chronic renal failure due to undiagnosed multiple myeloma I am hopeful that high-dose corticosteroid therapy might improve her situation Appreciate nephrology follow-up I have ordered 1 dose of rasburicase today  #5Chest pain with elevated troponin, resolving Echocardiogram show no evidence of cardiomyopathy Her chest pain could be triggered by hyperviscosity related to untreated multiple myeloma. Appreciate cardiology follow-up  #6 Severe bone pain, resolved Likely due to her underlying bone marrow disorder and hypercalcemia We will order  skeletal survey as above  #7 constipation, resolved Likely due to malignant hypercalcemia Continue aggressive laxative therapy  I have updated her children today I will return to check on her tomorrow  All questions were answered. The patient knows to call the clinic with any problems, questions or concerns.   Heath Lark, MD 03/03/2019 10:01 AM  Subjective:  She feels well.  Her bone pain has resolved.  She denies constipation.  She appears to be alert.  She denies chest pain  Objective:  Vitals:   03/03/19 0700 03/03/19 0800  BP: (!) 142/56 (!) 146/59  Pulse: 70   Resp: 16 15  Temp:  98.5 F (36.9 C)  SpO2: 97%      Intake/Output Summary (Last 24 hours) at 03/03/2019 1001 Last data filed at 03/02/2019 1800 Gross per 24 hour  Intake 676.67 ml  Output 200 ml  Net 476.67 ml    GENERAL:alert, no distress and comfortable SKIN: skin color, texture, turgor are normal, no rashes or significant lesions EYES: normal, Conjunctiva are pink and non-injected, sclera clear OROPHARYNX:no exudate, no erythema and lips, buccal mucosa, and tongue normal  NECK: supple, thyroid normal size, non-tender, without nodularity LYMPH:  no palpable lymphadenopathy in the cervical, axillary or inguinal LUNGS: clear to auscultation and percussion with normal breathing effort HEART: regular rate & rhythm and no murmurs and no lower extremity edema ABDOMEN:abdomen soft, non-tender and normal bowel sounds Musculoskeletal:no cyanosis of digits and no clubbing  NEURO: alert & oriented x 3 with fluent speech, no focal motor/sensory deficits   Labs:  Recent Labs    02/25/19 1103 02/26/19 0353  03/01/19 0258 03/02/19 0300 03/03/19 0239  NA 137 138   < >  137 137 134*  K 3.1* 3.5   < > 4.1 4.2 4.2  CL 101 104   < > 105 103 103  CO2 23 23   < > 20* 21* 19*  GLUCOSE 127* 88   < > 158* 137* 129*  BUN 50* 45*   < > 43* 57* 72*  CREATININE 4.46* 4.21*   < > 3.97* 4.15* 4.16*  CALCIUM 10.8* 10.2   < >  9.9 9.8 9.2  GFRNONAA 8* 9*   < > 10* 9* 9*  GFRAA 10* 10*   < > 11* 11* 11*  PROT 10.7* 9.4*  --   --   --   --   ALBUMIN 3.0* 2.5*   < > 2.4* 2.4* 2.5*  AST 18 18  --   --   --   --   ALT 12 13  --   --   --   --   ALKPHOS 30* 26*  --   --   --   --   BILITOT 0.8 0.3  --   --   --   --    < > = values in this interval not displayed.    Studies:  Dg Chest 1 View  Result Date: 02/25/2019 CLINICAL DATA:  Acute kidney injury and cough. EXAM: CHEST  1 VIEW COMPARISON:  None. FINDINGS: Left chest wall pacer device is noted with leads in the right atrial appendage and right ventricle. Mild cardiac enlargement. Aortic atherosclerosis. No pleural effusion or edema. No airspace opacities. IMPRESSION: 1. Cardiac enlargement.  No acute findings. 2.  Aortic Atherosclerosis (ICD10-I70.0). Electronically Signed   By: Kerby Moors M.D.   On: 02/25/2019 13:51   Dg Chest 2 View  Result Date: 03/01/2019 CLINICAL DATA:  Hypertension.  Diabetes. EXAM: CHEST - 2 VIEW COMPARISON:  Feb 28, 2019 FINDINGS: Stable cardiomegaly. The hila and mediastinum are unremarkable. Mild atelectasis in the left base. Mild patchy opacity in the right lung, unchanged. No other acute changes. IMPRESSION: Stable patchy opacities in the right lung. No other interval changes. Electronically Signed   By: Dorise Bullion III M.D   On: 03/01/2019 17:04   US Renal  Result Date: 02/25/2019 CLINICAL DATA:  85 year old female with acute renal failure EXAM: RENAL / URINARY TRACT ULTRASOUND COMPLETE COMPARISON:  None. FINDINGS: Right Kidney: Renal measurements: 11.9 x 4.1 x 4.3 cm = volume: 111 mL . Echogenicity within normal limits. No mass or hydronephrosis visualized. Left Kidney: Renal measurements: 12.5 x 4.2 x 4.2 cm = volume: 115 mL. Two renal cysts are present measuring 1.2 cm. Echogenicity within normal limits. No mass or hydronephrosis visualized. Bladder: Appears normal for degree of bladder distention, but bladder not well  distended. IMPRESSION: 1. Unremarkable kidneys except for 2 small LEFT renal cysts. No evidence of hydronephrosis. Electronically Signed   By: Margarette Canada M.D.   On: 02/25/2019 16:00   Dg Chest Port 1 View  Result Date: 02/28/2019 CLINICAL DATA:  Dyspnea. EXAM: PORTABLE CHEST 1 VIEW COMPARISON:  Radiograph of Feb 25, 2019. FINDINGS: Stable cardiomegaly. Atherosclerosis of thoracic aorta is noted. Left-sided pacemaker is unchanged in position. No pneumothorax or pleural effusion is noted. Left lung is clear. Mildly increased right lung opacities are noted concerning for either asymmetric edema or possibly pneumonia. Severe degenerative changes seen involving the right glenohumeral joint. IMPRESSION: Mildly increased right lung opacities are noted concerning for possible asymmetric edema or possibly pneumonia. Aortic Atherosclerosis (ICD10-I70.0). Electronically Signed   By: Jeneen Rinks  Murlean Caller M.D.   On: 02/28/2019 12:26

## 2019-03-04 LAB — RENAL FUNCTION PANEL
Albumin: 2.5 g/dL — ABNORMAL LOW (ref 3.5–5.0)
Anion gap: 11 (ref 5–15)
BUN: 83 mg/dL — ABNORMAL HIGH (ref 8–23)
CO2: 21 mmol/L — ABNORMAL LOW (ref 22–32)
Calcium: 9 mg/dL (ref 8.9–10.3)
Chloride: 102 mmol/L (ref 98–111)
Creatinine, Ser: 4.54 mg/dL — ABNORMAL HIGH (ref 0.44–1.00)
GFR calc Af Amer: 9 mL/min — ABNORMAL LOW (ref >60)
GFR calc non Af Amer: 8 mL/min — ABNORMAL LOW (ref >60)
Glucose, Bld: 123 mg/dL — ABNORMAL HIGH (ref 70–99)
Phosphorus: 3.2 mg/dL (ref 2.5–4.6)
Potassium: 4.2 mmol/L (ref 3.5–5.1)
Sodium: 134 mmol/L — ABNORMAL LOW (ref 135–145)

## 2019-03-04 LAB — GLUCOSE, CAPILLARY
Glucose-Capillary: 131 mg/dL — ABNORMAL HIGH (ref 70–99)
Glucose-Capillary: 155 mg/dL — ABNORMAL HIGH (ref 70–99)
Glucose-Capillary: 125 mg/dL — ABNORMAL HIGH (ref 70–99)
Glucose-Capillary: 133 mg/dL — ABNORMAL HIGH (ref 70–99)

## 2019-03-04 LAB — CBC WITH DIFFERENTIAL/PLATELET
Abs Immature Granulocytes: 0.08 10*3/uL — ABNORMAL HIGH (ref 0.00–0.07)
Basophils Absolute: 0 10*3/uL (ref 0.0–0.1)
Basophils Relative: 0 %
Eosinophils Absolute: 0 10*3/uL (ref 0.0–0.5)
Eosinophils Relative: 0 %
HCT: 24.9 % — ABNORMAL LOW (ref 36.0–46.0)
Hemoglobin: 7.9 g/dL — ABNORMAL LOW (ref 12.0–15.0)
Immature Granulocytes: 1 %
Lymphocytes Relative: 17 %
Lymphs Abs: 1.1 10*3/uL (ref 0.7–4.0)
MCH: 28.4 pg (ref 26.0–34.0)
MCHC: 31.7 g/dL (ref 30.0–36.0)
MCV: 89.6 fL (ref 80.0–100.0)
Monocytes Absolute: 0.5 10*3/uL (ref 0.1–1.0)
Monocytes Relative: 7 %
Neutro Abs: 5 10*3/uL (ref 1.7–7.7)
Neutrophils Relative %: 75 %
Platelets: 177 10*3/uL (ref 150–400)
RBC: 2.78 MIL/uL — ABNORMAL LOW (ref 3.87–5.11)
RDW: 16 % — ABNORMAL HIGH (ref 11.5–15.5)
WBC: 6.7 10*3/uL (ref 4.0–10.5)
nRBC: 0.4 % — ABNORMAL HIGH (ref 0.0–0.2)

## 2019-03-04 LAB — URIC ACID: Uric Acid, Serum: 2.3 mg/dL — ABNORMAL LOW (ref 2.5–7.1)

## 2019-03-04 MED ORDER — PROCHLORPERAZINE MALEATE 10 MG PO TABS
10.0000 mg | ORAL_TABLET | Freq: Once | ORAL | Status: AC
  Administered 2019-03-04: 10 mg via ORAL
  Filled 2019-03-04: qty 1

## 2019-03-04 MED ORDER — BORTEZOMIB CHEMO SQ INJECTION 3.5 MG (2.5MG/ML)
1.0400 mg/m2 | Freq: Once | INTRAMUSCULAR | Status: AC
  Administered 2019-03-04: 2 mg via SUBCUTANEOUS
  Filled 2019-03-04: qty 0.8

## 2019-03-04 NOTE — Progress Notes (Signed)
[name]   DOB:Oct 27, 1929   ZO#:[id number]    ASSESSMENT & PLAN:  #1Elevated SPEP and light chainsconsistent with multiple myeloma  Her current work-up is incomplete; recent labs confirmed IgG kappa myeloma Skeletal survey is reviewed which show no evidence of impeding bone fractures I have scheduled bedside bone marrow biopsy under local anesthesia only.  I explained the procedure to the patient and she agreed Bone Marrow Biopsy and Aspiration Procedure Note   Informed consent was obtained and potential risks including bleeding, infection and pain were reviewed with the patient. The patient's name, date of birth, identification, consent and allergies were verified prior to the start of procedure and time out was performed.  The right posterior iliac crest was chosen as the site of biopsy.  The skin was prepped with Betadine solution.   8 cc of 1% lidocaine was used to provide local anaesthesia.   10 cc of bone marrow aspirate was obtained followed by 1 inch biopsy.   The procedure was tolerated well and there were no complications.  The patient was stable at the end of the procedure.  Specimens sent for FISH, cytogenetics and additional studies.  She has completed 4 days of dexamethasone with improvement of her bone pain and hypercalcemia After the bone marrow biopsy, I can start her on weekly Velcade today I will arrange for chemo education nurse to stop by and administer chemotherapy  I will arrange with inpatient pharmacy to get her chemotherapy  Due to her multiple comorbidities, I favor Velcade q. 7 days instead of days 1, 4, 7 and 11 We discussed the risk, benefits, side effects of Velcade including pancytopenia, neuropathy, infection and others and she agreed to proceed.  #2 Anemia Likely due to her underlying bone marrow disorder She was transfused on 03/02/2019 Monitor closely, recheck tomorrow No need transfusion today  #3 Malignant hypercalcemia,  resolving She appears to be responding to steroids and malignant help her calcium is resolving Observe for now  #4 Acute renal failure She has acute on chronic renal failure due to undiagnosed multiple myeloma I am hopeful that high-dose corticosteroid therapy might improve her situation Appreciate nephrology follow-up She was given 1 dose of rasburicase on 03/03/2019 We discussed the possibility of hemodialysis and I will defer to nephrology for management  #5Chest pain with elevated troponin, resolving Echocardiogram show no evidence of cardiomyopathy Her chest pain could be triggered by hyperviscosity related to untreated multiple myeloma. Appreciate cardiology follow-up  #6 Severe bone pain, resolved Likely due to her underlying bone marrow disorder and hypercalcemia Skeletal survey showed no evidence of impeding bone fracture  #7 constipation, resolved Likely due to malignant hypercalcemia Continue aggressive laxative therapy  I haveupdated her children today I will return to check on her tomorrow All questions were answered. The patient knows to call the clinic with any problems, questions or concerns.   Heath Lark, MD 03/04/2019 8:36 AM  Subjective:    Objective:  Vitals:   03/04/19 0600 03/04/19 0704  BP: (!) 139/47   Pulse:    Resp: (!) 22   Temp:  98.5 F (36.9 C)  SpO2:       Intake/Output Summary (Last 24 hours) at 03/04/2019 0836 Last data filed at 03/04/2019 0600 Gross per 24 hour  Intake 650.03 ml  Output 1300 ml  Net -649.97 ml    GENERAL:alert, no distress and comfortable NEURO: alert & oriented x 3 with fluent speech, no focal motor/sensory deficits   Labs:  Recent Labs  02/25/19 1103 02/26/19 0353  03/02/19 0300 03/03/19 0239 03/04/19 0310  NA 137 138   < > 137 134* 134*  K 3.1* 3.5   < > 4.2 4.2 4.2  CL 101 104   < > 103 103 102  CO2 23 23   < > 21* 19* 21*  GLUCOSE 127* 88   < > 137* 129* 123*  BUN 50* 45*   < > 57* 72*  83*  CREATININE 4.46* 4.21*   < > 4.15* 4.16* 4.54*  CALCIUM 10.8* 10.2   < > 9.8 9.2 9.0  GFRNONAA 8* 9*   < > 9* 9* 8*  GFRAA 10* 10*   < > 11* 11* 9*  PROT 10.7* 9.4*  --   --   --   --   ALBUMIN 3.0* 2.5*   < > 2.4* 2.5* 2.5*  AST 18 18  --   --   --   --   ALT 12 13  --   --   --   --   ALKPHOS 30* 26*  --   --   --   --   BILITOT 0.8 0.3  --   --   --   --    < > = values in this interval not displayed.    Studies:  Dg Chest 1 View  Result Date: 02/25/2019 CLINICAL DATA:  Acute kidney injury and cough. EXAM: CHEST  1 VIEW COMPARISON:  None. FINDINGS: Left chest wall pacer device is noted with leads in the right atrial appendage and right ventricle. Mild cardiac enlargement. Aortic atherosclerosis. No pleural effusion or edema. No airspace opacities. IMPRESSION: 1. Cardiac enlargement.  No acute findings. 2.  Aortic Atherosclerosis (ICD10-I70.0). Electronically Signed   By: Kerby Moors M.D.   On: 02/25/2019 13:51   Dg Chest 2 View  Result Date: 03/01/2019 CLINICAL DATA:  Hypertension.  Diabetes. EXAM: CHEST - 2 VIEW COMPARISON:  Feb 28, 2019 FINDINGS: Stable cardiomegaly. The hila and mediastinum are unremarkable. Mild atelectasis in the left base. Mild patchy opacity in the right lung, unchanged. No other acute changes. IMPRESSION: Stable patchy opacities in the right lung. No other interval changes. Electronically Signed   By: Dorise Bullion III M.D   On: 03/01/2019 17:04   US Renal  Result Date: 02/25/2019 CLINICAL DATA:  85 year old female with acute renal failure EXAM: RENAL / URINARY TRACT ULTRASOUND COMPLETE COMPARISON:  None. FINDINGS: Right Kidney: Renal measurements: 11.9 x 4.1 x 4.3 cm = volume: 111 mL . Echogenicity within normal limits. No mass or hydronephrosis visualized. Left Kidney: Renal measurements: 12.5 x 4.2 x 4.2 cm = volume: 115 mL. Two renal cysts are present measuring 1.2 cm. Echogenicity within normal limits. No mass or hydronephrosis visualized. Bladder:  Appears normal for degree of bladder distention, but bladder not well distended. IMPRESSION: 1. Unremarkable kidneys except for 2 small LEFT renal cysts. No evidence of hydronephrosis. Electronically Signed   By: Margarette Canada M.D.   On: 02/25/2019 16:00   Dg Chest Port 1 View  Result Date: 02/28/2019 CLINICAL DATA:  Dyspnea. EXAM: PORTABLE CHEST 1 VIEW COMPARISON:  Radiograph of Feb 25, 2019. FINDINGS: Stable cardiomegaly. Atherosclerosis of thoracic aorta is noted. Left-sided pacemaker is unchanged in position. No pneumothorax or pleural effusion is noted. Left lung is clear. Mildly increased right lung opacities are noted concerning for either asymmetric edema or possibly pneumonia. Severe degenerative changes seen involving the right glenohumeral joint. IMPRESSION: Mildly increased right lung opacities  are noted concerning for possible asymmetric edema or possibly pneumonia. Aortic Atherosclerosis (ICD10-I70.0). Electronically Signed   By: Marijo Conception M.D.   On: 02/28/2019 12:26   Dg Bone Survey Met  Result Date: 03/03/2019 CLINICAL DATA:  85 year old female with multiple myeloma, pain radiating through the right leg. EXAM: METASTATIC BONE SURVEY COMPARISON:  CT lumbar myelogram 06/19/2018 FINDINGS: Scattered round lucent lesions in the skull. Degenerative changes in the cervical spine where bone mineralization appears within normal limits. Degenerative changes in the thoracic spine and lumbar spine. No spinal compression fracture identified. Spine bone mineralization appears within normal limits. Previous lower lumbar interbody fusion. Calcified aortic atherosclerosis. Questionable heterogeneous lucency in the iliac wings. Femoral heads are normally located and hip joint spaces are normal for age. Pubic rami appear intact. No definite lytic lesion in the right lower extremity. Total right knee arthroplasty. There is an oval lytic lesion in the distal left femur measuring 11 millimeters (arrow). Prior  left knee arthroplasty also. Elsewhere in the left lower extremity bone mineralization appears normal for age. Advanced degenerative changes at the right shoulder and wrist. There are lucent lesions in the distal right humerus. Cardiomegaly with left chest cardiac pacemaker. No discrete rib lesion identified. Small lucent lesions in the mid and distal humerus. Mild to moderate degenerative changes in the left upper extremity. No pathologic fracture identified. IMPRESSION: 1. Round and oval lucent/lytic lesions in the skull, bilateral humeri, distal left femur, and possibly also in the pelvis are compatible with multiple myeloma. No right lower extremity lytic lesion identified. No pathologic fracture. 2. Advanced degenerative changes in the spine with no vertebral myeloma evident by plain radiography. 3. Advanced degenerative changes in the right upper extremity. Bilateral knee arthroplasty. 4.  Aortic Atherosclerosis (ICD10-I70.0). Electronically Signed   By: Genevie Ann M.D.   On: 03/03/2019 19:59

## 2019-03-04 NOTE — Progress Notes (Signed)
[facility]  PROGRESS NOTE  [name] CBJ:[id number] DOB: 1930/02/09 DOA: 02/25/2019 PCP: Lucianne Lei, MD   Brief HPI:   85 year old female with a history of hypertension, diabetes mellitus, bradycardia, status post pacemaker placement came to hospital with abnormal labs and fatigue.  Patient was seen by her PCP had labs drawn which showed that renal function was abnormal.  Patient has been taking Motrin off and on for past 1 month for chronic back pain.  Patient has HCTZ mentioning med rec list but has not been taking that for quite a while.  She does take Micardis for hypertension.  Patient baseline creatinine was around 1.0 per labs shown in epic in 2017.  COVID-19 test is negative    Subjective   Patient seen and examined, denies chest pain or shortness of breath.  Plan for bone marrow biopsy today.   Assessment/Plan:     1. Acute kidney injury-patient presented with creatinine of 4.46 on admission, last creatinine was 1.0 and 2017.  Likely multifactorial from Motrin use, poor p.o. intake, ARB, patient diagnosed with multiple myeloma.  Renal ultrasound obtained showed no hydronephrosis. Started on IV normal saline.  Nephrology was consulted.  Initially creatinine improved.  Yesterday she was given 1 dose of Lasix 60 mg IV for pulmonary edema.  Today creatinine has a following.  Creatinine today has jumped to 4.54.   2. Acute pulmonary edema-improved, patient devloped pulmonary edema this morning likely from fluid overload, which patient received for acute kidney injury.  Patient was started on nitroglycerin gtt and transferred to stepdown.  Also had mild elevation of troponin 0.1-2.20.  Cardiology was consulted.  No new recommendations.  Cardiology has signed off.  Patient is on isosorbide /hydralazine 20/37.5 mg 3 times daily.  She is currently not only IV Lasix.  3. Multiple myeloma-new diagnosis, patient presented with AKI, hypercalcemia, hypoproteinemia.  SPEP labs showed  M spike protein with 4.1 g/dL.  Urinary kappa free light chain 9676.  Kappa lambda light chain ratio 1209.58.  Called and discussed with oncologist, patient most likely has multiple myeloma.  No bone marrow biopsy recommended at this time due to COVID-19.  Patient has been started on IV Decadron per oncology.  Bone marrow biopsy today.  4. Hypercalcemia-today calcium is 9.0, corrected calcium is 10.2.  It has improved after starting on IV hydration with normal saline.  IV fluids are currently hold due to development of pulmonary edema as above.  Patient also started on IV Decadron as per oncology.  5. Hypertension-blood pressure well controlled on Norvasc 10 mg daily, Micardis 80 mg daily.  She does not take HCTZ.  Will hold Micardis due to worsening renal function.  Continue amlodipine.  6. Diabetes mellitus type 2-continue sliding scale insulin with NovoLog.  CBGs well controlled.  7. Gout-stable, continue allopurinol.  8. 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  9. Anemia of chronic disease-patient's hemoglobin is 7.9 after 1 unit PRBC..  10. Hypokalemia-replete     CBG: Recent Labs  Lab 03/03/19 1229 03/03/19 1658 03/03/19 2201 03/04/19 0836 03/04/19 1202  GLUCAP 166* 144* 148* 131* 155*    CBC: Recent Labs  Lab 02/26/19 0353 02/28/19 0443 03/02/19 0300 03/02/19 1446 03/03/19 0239 03/04/19 0310  WBC 4.7 5.0 7.6  --  6.8 6.7  NEUTROABS  --   --  6.1  --   --  5.0  HGB 7.8* 7.7* 6.8* 8.2* 7.6* 7.9*  HCT 25.6* 24.3* 21.2* 24.9* 23.3* 24.9*  MCV  93.4 93.5 91.4  --  88.6 89.6  PLT 184 164 162  --  157 017    Basic Metabolic Panel: Recent Labs  Lab 02/28/19 0443 03/01/19 0258 03/02/19 0300 03/03/19 0239 03/04/19 0310  NA 139 137 137 134* 134*  K 3.8 4.1 4.2 4.2 4.2  CL 108 105 103 103 102  CO2 22 20* 21* 19* 21*  GLUCOSE 91 158* 137* 129* 123*  BUN 42* 43* 57* 72* 83*  CREATININE 4.03* 3.97* 4.15* 4.16* 4.54*  CALCIUM 10.0  9.9 9.8 9.2 9.0  PHOS 3.7 4.9* 3.9 3.6 3.2     DVT prophylaxis: Heparin  Code Status: Full code  Family Communication: No family at bedside  Disposition Plan: likely home when medically ready for discharge     Consultants:  None  Procedures:  None   Antibiotics:   Anti-infectives (From admission, onward)   None       Objective   Vitals:   03/04/19 0600 03/04/19 0704 03/04/19 0800 03/04/19 1200  BP: (!) 139/47  (!) 139/55   Pulse:   71   Resp: (!) 22  18   Temp:  98.5 F (36.9 C) 98.3 F (36.8 C) 97.6 F (36.4 C)  TempSrc:  Oral Oral Oral  SpO2:   98%   Weight:      Height:        Intake/Output Summary (Last 24 hours) at 03/04/2019 1419 Last data filed at 03/04/2019 1000 Gross per 24 hour  Intake 480 ml  Output 1050 ml  Net -570 ml   Filed Weights   02/25/19 1012  Weight: 81.2 kg     Physical Examination:   General-appears in no acute distress Heart-S1-S2, regular, no murmur auscultated Lungs-faint crackles bilaterally at lung bases Abdomen-soft, nontender, no organomegaly Extremities-no edema in the lower extremities Neuro-alert, oriented x3, no focal deficit noted  Data Reviewed: I have personally reviewed following labs and imaging studies   Recent Results (from the past 240 hour(s))  SARS Coronavirus 2 (CEPHEID - Performed in [place] hospital lab), Hosp Order     Status: None   Collection Time: 02/25/19 11:58 AM  Result Value Ref Range Status   SARS Coronavirus 2 NEGATIVE NEGATIVE Final    Comment: (NOTE) If result is NEGATIVE SARS-CoV-2 target nucleic acids are NOT DETECTED. The SARS-CoV-2 RNA is generally detectable in upper and lower  respiratory specimens during the acute phase of infection. The lowest  concentration of SARS-CoV-2 viral copies this assay can detect is 250  copies / mL. A negative result does not preclude SARS-CoV-2 infection  and should not be used as the sole basis for treatment or other  patient  management decisions.  A negative result may occur with  improper specimen collection / handling, submission of specimen other  than nasopharyngeal swab, presence of viral mutation(s) within the  areas targeted by this assay, and inadequate number of viral copies  (<250 copies / mL). A negative result must be combined with clinical  observations, patient history, and epidemiological information. If result is POSITIVE SARS-CoV-2 target nucleic acids are DETECTED. The SARS-CoV-2 RNA is generally detectable in upper and lower  respiratory specimens dur ing the acute phase of infection.  Positive  results are indicative of active infection with SARS-CoV-2.  Clinical  correlation with patient history and other diagnostic information is  necessary to determine patient infection status.  Positive results do  not rule out bacterial infection or co-infection with other viruses. If result is PRESUMPTIVE  POSTIVE SARS-CoV-2 nucleic acids MAY BE PRESENT.   A presumptive positive result was obtained on the submitted specimen  and confirmed on repeat testing.  While 2019 novel coronavirus  (SARS-CoV-2) nucleic acids may be present in the submitted sample  additional confirmatory testing may be necessary for epidemiological  and / or clinical management purposes  to differentiate between  SARS-CoV-2 and other Sarbecovirus currently known to infect humans.  If clinically indicated additional testing with an alternate test  methodology [phone]) is advised. The SARS-CoV-2 RNA is generally  detectable in upper and lower respiratory sp ecimens during the acute  phase of infection. The expected result is Negative. Fact Sheet for Patients:  StrictlyIdeas.no Fact Sheet for Healthcare Providers: BankingDealers.co.za This test is not yet approved or cleared by the [place] FDA and has been authorized for detection and/or diagnosis of SARS-CoV-2 by FDA under  an Emergency Use Authorization (EUA).  This EUA will remain in effect (meaning this test can be used) for the duration of the COVID-19 declaration under Section 564(b)(1) of the Act, 21 U.S.C. section 360bbb-3(b)(1), unless the authorization is terminated or revoked sooner. Performed at [facility], [place] [address], [place], [place] [address]   Culture, Urine     Status: Abnormal   Collection Time: 02/26/19  7:02 PM  Result Value Ref Range Status   Specimen Description   Final    URINE, CLEAN CATCH Performed at [facility], [place] [address] [place], [place] [address]    Special Requests   Final    NONE Performed at [facility], [place] [address], [place], [place] [address]    Culture (A)  Final    50,000 COLONIES/mL LACTOBACILLUS SPECIES Standardized susceptibility testing for this organism is not available. Performed at [facility], [place] [address] [place], [place] [address]    Report Status 02/27/2019 FINAL  Final  MRSA PCR Screening     Status: None   Collection Time: 02/28/19 12:45 PM  Result Value Ref Range Status   MRSA by PCR NEGATIVE NEGATIVE Final    Comment:        The GeneXpert MRSA Assay (FDA approved for NASAL specimens only), is one component of a comprehensive MRSA colonization surveillance program. It is not intended to diagnose MRSA infection nor to guide or monitor treatment for MRSA infections. Performed at [facility], [place] [address], [place], [place] [address]      Liver Function Tests: Recent Labs  Lab 02/26/19 0160 02/28/19 0443 03/01/19 0258 03/02/19 0300 03/03/19 0239 03/04/19 0310  AST 18  --   --   --   --   --   ALT 13  --   --   --   --   --   ALKPHOS 26*  --   --   --   --   --   BILITOT 0.3  --   --   --   --   --   PROT 9.4*  --   --   --   --   --   ALBUMIN 2.5* 2.5* 2.4* 2.4* 2.5* 2.5*   No results for input(s): LIPASE, AMYLASE in the last 168  hours.    Studies: Dg Bone Survey Met  Result Date: 03/03/2019 CLINICAL DATA:  85 year old female with multiple myeloma, pain radiating through the right leg. EXAM: METASTATIC BONE SURVEY COMPARISON:  CT lumbar myelogram 06/19/2018 FINDINGS: Scattered round lucent lesions in the skull. Degenerative  changes in the cervical spine where bone mineralization appears within normal limits. Degenerative changes in the thoracic spine and lumbar spine. No spinal compression fracture identified. Spine bone mineralization appears within normal limits. Previous lower lumbar interbody fusion. Calcified aortic atherosclerosis. Questionable heterogeneous lucency in the iliac wings. Femoral heads are normally located and hip joint spaces are normal for age. Pubic rami appear intact. No definite lytic lesion in the right lower extremity. Total right knee arthroplasty. There is an oval lytic lesion in the distal left femur measuring 11 millimeters (arrow). Prior left knee arthroplasty also. Elsewhere in the left lower extremity bone mineralization appears normal for age. Advanced degenerative changes at the right shoulder and wrist. There are lucent lesions in the distal right humerus. Cardiomegaly with left chest cardiac pacemaker. No discrete rib lesion identified. Small lucent lesions in the mid and distal humerus. Mild to moderate degenerative changes in the left upper extremity. No pathologic fracture identified. IMPRESSION: 1. Round and oval lucent/lytic lesions in the skull, bilateral humeri, distal left femur, and possibly also in the pelvis are compatible with multiple myeloma. No right lower extremity lytic lesion identified. No pathologic fracture. 2. Advanced degenerative changes in the spine with no vertebral myeloma evident by plain radiography. 3. Advanced degenerative changes in the right upper extremity. Bilateral knee arthroplasty. 4.  Aortic Atherosclerosis (ICD10-I70.0). Electronically Signed   By: Genevie Ann  M.D.   On: 03/03/2019 19:59    Scheduled Meds: . allopurinol  100 mg Oral Daily  . amLODipine  10 mg Oral Daily  . bortezomib SQ  1.04 mg/m2 (Treatment Plan Recorded) Subcutaneous Once  . Chlorhexidine Gluconate Cloth  6 each Topical Daily  . dorzolamide  1 drop Left Eye QPM  . fluorometholone  1 drop Right Eye TID  . heparin  5,000 Units Subcutaneous Q8H  . insulin aspart  0-9 Units Subcutaneous TID WC  . isosorbide-hydrALAZINE  1 tablet Oral TID  . latanoprost  1 drop Left Eye QHS  . mouth rinse  15 mL Mouth Rinse BID  . pantoprazole  40 mg Oral Daily  . pregabalin  50 mg Oral Daily    Admission status: Inpatient: Based on patients clinical presentation and evaluation of above clinical data, I have made determination that patient meets Inpatient criteria at this time.  Time spent: 25 min  [facility] Pager [phone]. If 7PM-7AM, please contact night-coverage at [email or web address], Office  [phone]  password [place]  03/04/2019, 2:19 PM  LOS: 6 days

## 2019-03-04 NOTE — Progress Notes (Addendum)
Patient received chemotherapy education regarding Velcade.  Patient's dose of Velcade verified independently with Laural Benes, RN based on normal dosing and patient's BSA.  Patient signed consent and verbalized understanding of teaching.  Chemotherapy education folder left in room with patient for her review as well as to share with her family.  Patient tolerated injection without any issue.

## 2019-03-04 NOTE — Progress Notes (Signed)
Nephrology Progress Note   Subjective:   Pt feels ok today.  Spoke with patient regarding risks, benefits, and indications of dialysis and she would want dialysis if needed and understands this may need to happen soon.  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  Spoke with hem/onc.  They are about to do a bone marrow biopsy with plans to start velcade here today as well.  Review of systems:   No difficulty with urination Shortness of breath with exertion  Nausea better   Objective Vital signs in last 24 hours: Vitals:   03/04/19 0200 03/04/19 0400 03/04/19 0600 03/04/19 0704  BP: 140/60 (!) 131/51 (!) 139/47   Pulse: 70 70    Resp: 15 13 (!) 22   Temp:  97.8 F (36.6 C)  98.5 F (36.9 C)  TempSrc:  Oral  Oral  SpO2: 100% 100%    Weight:      Height:       Weight change:   Intake/Output Summary (Last 24 hours) at 03/04/2019 0806 Last data filed at 03/04/2019 0600 Gross per 24 hour  Intake 650.03 ml  Output 1300 ml  Net -649.97 ml    Assessment/ Plan: Pt is a 85 y.o. yo female with DM, HTN, unknown baseline renal status (crt 1.0 to 1.3 in 2017 ) who was admitted on 02/25/2019 with AKI (vs chronic)  in the setting of multiple recent renal insults (NSAIDS/ARB) and suspicion for multiple myeloma   Assessment/Plan: 1. AKI-  - Secondary to presumed multiple myeloma.  Also with recent pre-renal insult of prolonged n/v; hx NSAIDS and ARB.  Her n/v may have been symptoms of progressive renal failure. urinalysis pretty bland- 6-10 WBC but with trace leuks and neg nitrite, urine culture not remarkable for treatment.  Renal ultrasound pretty unremarkable.  Protein to crt ratio 1.8-  30 on the dip.  SPEP positive in the setting of anemia, hypercalcemia and hyperproteinemia and presumed myeloma.  Renal function essentially unchanged despite IV fluids and holding RAAS blockade.  Note S/p decadron  - Non-oliguric. No acute indication for dialysis however she would need HD should  her renal function remain unchanged.  She is aware and does want HD when needed.   - Hopeful for possible gradual improvement over time with treatment of myeloma - Avoid NSAID's and ARB's/Ace inhibitors  2.  SPEP positive- likely diagnosis of multiple myeloma.  Skeletal survey with multiple lytic lesions in skull and extremities.  Hem/onc is about to perform a bone marrow biopsy.  Plans for velcade per hem/onc and s/p decadron.  On allopurinol.  Continue monitoring uric acid levels - s/p rasburicase x 1  3. Anemia- secondary to mutliple myeloma and likely component of renal failure, as well. worsening with IVF with iron deficiency.  Replete iron stores and s/p one-time dose of darbe.  s/p PRBC's x 1 on 5/17  4. Hypercalcemia - setting of multiple myeloma.  Improving     5. HTN/volume-  Improved control. Echo looked reasonable- cards not recommending anything specific.  Avoid hypotension.  6. Proteinuria  - Secondary to presumed multiple myeloma - Off of RAAS blockade with AKI   7. Metabolic acidosis - secondary in part to renal failure.  Stable   I updated her son today, as well.  Anticipate need to move to [facility] in 1-2 days  [place]: Basic Metabolic Panel: Recent Labs  Lab 03/02/19 0300 03/03/19 0239 03/04/19 0310  NA 137 134* 134*  K 4.2 4.2 4.2  CL 103 103 102  CO2 21* 19* 21*  GLUCOSE 137* 129* 123*  BUN 57* 72* 83*  CREATININE 4.15* 4.16* 4.54*  CALCIUM 9.8 9.2 9.0  PHOS 3.9 3.6 3.2   Liver Function Tests: Recent Labs  Lab 02/25/19 1103 02/26/19 0353  03/02/19 0300 03/03/19 0239 03/04/19 0310  AST 18 18  --   --   --   --   ALT 12 13  --   --   --   --   ALKPHOS 30* 26*  --   --   --   --   BILITOT 0.8 0.3  --   --   --   --   PROT 10.7* 9.4*  --   --   --   --   ALBUMIN 3.0* 2.5*   < > 2.4* 2.5* 2.5*   < > = values in this interval not displayed.   CBC: Recent Labs  Lab 02/25/19 1103  02/26/19 0353 02/28/19 0443 03/02/19 0300  03/02/19 1446 03/03/19 0239 03/04/19 0310  WBC 4.4   < > 4.7 5.0 7.6  --  6.8 6.7  NEUTROABS 2.5  --   --   --  6.1  --   --  5.0  HGB 8.6*   < > 7.8* 7.7* 6.8* 8.2* 7.6* 7.9*  HCT 26.4*   < > 25.6* 24.3* 21.2* 24.9* 23.3* 24.9*  MCV 91.0   < > 93.4 93.5 91.4  --  88.6 89.6  PLT 181   < > 184 164 162  --  157 177   < > = values in this interval not displayed.   Cardiac Enzymes: Recent Labs  Lab 02/28/19 1000 02/28/19 1504 02/28/19 2118  TROPONINI 0.12* 0.15* 0.20*   CBG: Recent Labs  Lab 03/02/19 2152 03/03/19 0757 03/03/19 1229 03/03/19 1658 03/03/19 2201  GLUCAP 146* 100* 166* 144* 148*    Iron Studies:  No results for input(s): IRON, TIBC, TRANSFERRIN, FERRITIN in the last 72 hours. Studies/Results: Dg Bone Survey Met  Result Date: 03/03/2019 CLINICAL DATA:  85 year old female with multiple myeloma, pain radiating through the right leg. EXAM: METASTATIC BONE SURVEY COMPARISON:  CT lumbar myelogram 06/19/2018 FINDINGS: Scattered round lucent lesions in the skull. Degenerative changes in the cervical spine where bone mineralization appears within normal limits. Degenerative changes in the thoracic spine and lumbar spine. No spinal compression fracture identified. Spine bone mineralization appears within normal limits. Previous lower lumbar interbody fusion. Calcified aortic atherosclerosis. Questionable heterogeneous lucency in the iliac wings. Femoral heads are normally located and hip joint spaces are normal for age. Pubic rami appear intact. No definite lytic lesion in the right lower extremity. Total right knee arthroplasty. There is an oval lytic lesion in the distal left femur measuring 11 millimeters (arrow). Prior left knee arthroplasty also. Elsewhere in the left lower extremity bone mineralization appears normal for age. Advanced degenerative changes at the right shoulder and wrist. There are lucent lesions in the distal right humerus. Cardiomegaly with left chest  cardiac pacemaker. No discrete rib lesion identified. Small lucent lesions in the mid and distal humerus. Mild to moderate degenerative changes in the left upper extremity. No pathologic fracture identified. IMPRESSION: 1. Round and oval lucent/lytic lesions in the skull, bilateral humeri, distal left femur, and possibly also in the pelvis are compatible with multiple myeloma. No right lower extremity lytic lesion identified. No pathologic fracture. 2. Advanced degenerative changes in the  spine with no vertebral myeloma evident by plain radiography. 3. Advanced degenerative changes in the right upper extremity. Bilateral knee arthroplasty. 4.  Aortic Atherosclerosis (ICD10-I70.0). Electronically Signed   By: Genevie Ann M.D.   On: 03/03/2019 19:59   Medications: Infusions: . sodium chloride Stopped (03/03/19 0440)  . ferumoxytol Stopped (02/28/19 1534)    Scheduled Medications: . allopurinol  100 mg Oral Daily  . amLODipine  10 mg Oral Daily  . Chlorhexidine Gluconate Cloth  6 each Topical Daily  . dorzolamide  1 drop Left Eye QPM  . fluorometholone  1 drop Right Eye TID  . heparin  5,000 Units Subcutaneous Q8H  . insulin aspart  0-9 Units Subcutaneous TID WC  . isosorbide-hydrALAZINE  1 tablet Oral TID  . latanoprost  1 drop Left Eye QHS  . mouth rinse  15 mL Mouth Rinse BID  . pantoprazole  40 mg Oral Daily  . pregabalin  50 mg Oral Daily    have reviewed scheduled and prn medications.  Physical Exam: General: elderly female in bed in NAD at rest  Heart: S1S2; no rub  Lungs: clear to auscultation bilaterally; normal work of breathing at rest  Abdomen: soft/NT/ND Extremities: no edema lower extremities  Neuro - awake and alert, conversant     03/04/2019,8:06 AM  LOS: 6 days

## 2019-03-04 NOTE — TOC Progression Note (Signed)
Transition of Care [facility]) - Progression Note    Patient Details  Name: [name] MRN: [id number] Date of Birth: 1930/08/12  Transition of Care [facility]) CM/SW Contact  Joaquin Courts, RN Phone Number: 03/04/2019, 11:35 AM  Clinical Narrative:  CM arranged for HHPT services.  Services to be provided by [facility].  CM spoke with representative, Tommi Rumps, to confirm.       Expected Discharge Plan: Home/Self Care Barriers to Discharge: Continued Medical Work up  Expected Discharge Plan and Services Expected Discharge Plan: Home/Self Care   Discharge Planning Services: CM Consult Post Acute Care Choice: [place] arrangements for the past 2 months: Single Family Home Expected Discharge Date: (unknown)                         HH Arranged: PT HH Agency: [place] Date [place]: 03/04/19 Time [place]: 1134 Representative spoke with at [place]: [place] ([place]) Interventions    Readmission Risk Interventions Readmission Risk Prevention Plan 03/03/2019  Transportation Screening Complete  PCP or Specialist Appt within 3-5 Days Not Complete  Not Complete comments not yet ready for d/c  HRI or [place] Not Complete  HRI or Home Care Consult comments no needs at this time  Social Work Consult for [place] Planning/Counseling Not Complete  SW consult not completed comments no needs at this time  Palliative Care Screening Not Applicable  Medication Review Press photographer) Complete  Some recent data might be hidden

## 2019-03-05 DIAGNOSIS — E1169 Type 2 diabetes mellitus with other specified complication: Secondary | ICD-10-CM

## 2019-03-05 DIAGNOSIS — I10 Essential (primary) hypertension: Secondary | ICD-10-CM

## 2019-03-05 LAB — CBC
HCT: 26.2 % — ABNORMAL LOW (ref 36.0–46.0)
Hemoglobin: 8.2 g/dL — ABNORMAL LOW (ref 12.0–15.0)
MCH: 28.4 pg (ref 26.0–34.0)
MCHC: 31.3 g/dL (ref 30.0–36.0)
MCV: 90.7 fL (ref 80.0–100.0)
Platelets: 181 10*3/uL (ref 150–400)
RBC: 2.89 MIL/uL — ABNORMAL LOW (ref 3.87–5.11)
RDW: 16 % — ABNORMAL HIGH (ref 11.5–15.5)
WBC: 6.6 10*3/uL (ref 4.0–10.5)
nRBC: 0.9 % — ABNORMAL HIGH (ref 0.0–0.2)

## 2019-03-05 LAB — RENAL FUNCTION PANEL
Albumin: 2.1 g/dL — ABNORMAL LOW (ref 3.5–5.0)
Anion gap: 9 (ref 5–15)
BUN: 92 mg/dL — ABNORMAL HIGH (ref 8–23)
CO2: 22 mmol/L (ref 22–32)
Calcium: 8.5 mg/dL — ABNORMAL LOW (ref 8.9–10.3)
Chloride: 103 mmol/L (ref 98–111)
Creatinine, Ser: 4.77 mg/dL — ABNORMAL HIGH (ref 0.44–1.00)
GFR calc Af Amer: 9 mL/min — ABNORMAL LOW (ref >60)
GFR calc non Af Amer: 8 mL/min — ABNORMAL LOW (ref >60)
Glucose, Bld: 94 mg/dL (ref 70–99)
Phosphorus: 3.3 mg/dL (ref 2.5–4.6)
Potassium: 3.7 mmol/L (ref 3.5–5.1)
Sodium: 134 mmol/L — ABNORMAL LOW (ref 135–145)

## 2019-03-05 LAB — GLUCOSE, CAPILLARY
Glucose-Capillary: 81 mg/dL (ref 70–99)
Glucose-Capillary: 83 mg/dL (ref 70–99)
Glucose-Capillary: 78 mg/dL (ref 70–99)
Glucose-Capillary: 89 mg/dL (ref 70–99)
Glucose-Capillary: 75 mg/dL (ref 70–99)

## 2019-03-05 MED ORDER — SENNOSIDES-DOCUSATE SODIUM 8.6-50 MG PO TABS: 1.0000 | ORAL_TABLET | Freq: Two times a day (BID) | ORAL | Status: DC | PRN

## 2019-03-05 NOTE — Progress Notes (Signed)
RN notified by tech that patients BP decreased to 64/31.  RN assessed patient and found that patient was on BSC straining to have a bowel movement.  Pt. Resting comfortably in chair with a systolic maintaining in the 90's.  Pt. Is weak but denies light headneses or dizziness at this time.  MD updated.  RN will continue to monitor.

## 2019-03-05 NOTE — Progress Notes (Signed)
RT set up patient home unit CPAP. NO O2 bleed in needed. Patient tolerating well.

## 2019-03-05 NOTE — Progress Notes (Signed)
Nephrology Progress Note   Subjective:   Pt feels ok again today, no vomiting or confusion.  Nausea a little better. Appetite marginal . B/Cr up again slightly today.  UOP 500 cc y est.   Objective Vital signs in last 24 hours: Vitals:   03/05/19 0200 03/05/19 0400 03/05/19 0600 03/05/19 0800  BP: (!) 113/44 (!) 115/45 (!) 127/49 (!) 122/45  Pulse: 70 73 70 69  Resp: _0 Temp:  98.3 F (36.8 C)  98.2 F (36.8 C)  TempSrc:  Oral  Oral  SpO2: 94% 97% 97% 97%  Weight:    81.9 kg  Height:       Weight change:   Intake/Output Summary (Last 24 hours) at 03/05/2019 1027 Last data filed at 03/04/2019 2200 Gross per 24 hour  Intake 240 ml  Output 350 ml  Net -110 ml    Assessment/ Plan: Pt is a 85 y.o. yo female with DM, HTN, unknown baseline renal status (crt 1.0 to 1.3 in 2017 ) who was admitted on 02/25/2019 with AKI (vs chronic)  in the setting of multiple recent renal insults (NSAIDS/ARB) and suspicion for multiple myeloma   Assessment/Plan: 1. AKI - presumed secondary to multiple myeloma,  also + N/V, +NSAIDS and ARB. UA not suggestive of GN.  Renal ultrasound unremarkable.  Protein to crt ratio 1.8, +30 on the dip.  SPEP positive in the setting of anemia, hypercalcemia and hyperproteinemia and presumed myeloma. Note S/p decadron  - making urine but B/Cr steadily rising and pt is elderly and may require dialysis soon so would recommend transfer to [facility], have d/w primary - may or may not see improvement w/ renal fxn over time with treatment of myeloma - avoid NSAID's and ARB's/Ace inhibitors  2.  SPEP positive- likely diagnosis of multiple myeloma.  Skeletal survey with multiple lytic lesions in skull and extremities.  W/u in progress.  Started on Velcade per ONC and is s/p decadron.  s/p rasburicase x 1.   3. Anemia- secondary to mutliple myeloma and likely component of renal failure, as well. Worsening with IVF with iron deficiency.  Replete iron stores and s/p one-time  dose of darbe.  s/p PRBC's x 1 on 5/17  4. Hypercalcemia - setting of multiple myeloma.  Improving     5. HTN/volume-  Improved control. Echo looked reasonable- cards not recommending anything specific.  Avoid hypotension.  6. Proteinuria  - Secondary to presumed multiple myeloma - Off of RAAS blockade with AKI   7. Metabolic acidosis - secondary in part to renal failure.  Stable   Kelly Splinter, MD 03/05/2019, 10:34 AM    Labs: Basic Metabolic Panel: Recent Labs  Lab 03/03/19 0239 03/04/19 0310 03/05/19 0251  NA 134* 134* 134*  K 4.2 4.2 3.7  CL 103 102 103  CO2 19* 21* 22  GLUCOSE 129* 123* 94  BUN 72* 83* 92*  CREATININE 4.16* 4.54* 4.77*  CALCIUM 9.2 9.0 8.5*  PHOS 3.6 3.2 3.3   Liver Function Tests: Recent Labs  Lab 03/03/19 0239 03/04/19 0310 03/05/19 0251  ALBUMIN 2.5* 2.5* 2.1*   CBC: Recent Labs  Lab 02/28/19 0443 03/02/19 0300  03/03/19 0239 03/04/19 0310 03/05/19 0251  WBC 5.0 7.6  --  6.8 6.7 6.6  NEUTROABS  --  6.1  --   --  5.0  --   HGB 7.7* 6.8*   < > 7.6* 7.9* 8.2*  HCT 24.3* 21.2*   < > 23.3* 24.9* 26.2*  MCV 93.5 91.4  --  88.6 89.6 90.7  PLT 164 162  --  157 177 181   < > = values in this interval not displayed.   Cardiac Enzymes: Recent Labs  Lab 02/28/19 1000 02/28/19 1504 02/28/19 2118  TROPONINI 0.12* 0.15* 0.20*   CBG: Recent Labs  Lab 03/04/19 0836 03/04/19 1202 03/04/19 1718 03/04/19 2143 03/05/19 0821  GLUCAP 131* 155* 125* 133* 81    Iron Studies:  No results for input(s): IRON, TIBC, TRANSFERRIN, FERRITIN in the last 72 hours. Studies/Results: Dg Bone Survey Met  Result Date: 03/03/2019 CLINICAL DATA:  85 year old female with multiple myeloma, pain radiating through the right leg. EXAM: METASTATIC BONE SURVEY COMPARISON:  CT lumbar myelogram 06/19/2018 FINDINGS: Scattered round lucent lesions in the skull. Degenerative changes in the cervical spine where bone mineralization appears within normal limits.  Degenerative changes in the thoracic spine and lumbar spine. No spinal compression fracture identified. Spine bone mineralization appears within normal limits. Previous lower lumbar interbody fusion. Calcified aortic atherosclerosis. Questionable heterogeneous lucency in the iliac wings. Femoral heads are normally located and hip joint spaces are normal for age. Pubic rami appear intact. No definite lytic lesion in the right lower extremity. Total right knee arthroplasty. There is an oval lytic lesion in the distal left femur measuring 11 millimeters (arrow). Prior left knee arthroplasty also. Elsewhere in the left lower extremity bone mineralization appears normal for age. Advanced degenerative changes at the right shoulder and wrist. There are lucent lesions in the distal right humerus. Cardiomegaly with left chest cardiac pacemaker. No discrete rib lesion identified. Small lucent lesions in the mid and distal humerus. Mild to moderate degenerative changes in the left upper extremity. No pathologic fracture identified. IMPRESSION: 1. Round and oval lucent/lytic lesions in the skull, bilateral humeri, distal left femur, and possibly also in the pelvis are compatible with multiple myeloma. No right lower extremity lytic lesion identified. No pathologic fracture. 2. Advanced degenerative changes in the spine with no vertebral myeloma evident by plain radiography. 3. Advanced degenerative changes in the right upper extremity. Bilateral knee arthroplasty. 4.  Aortic Atherosclerosis (ICD10-I70.0). Electronically Signed   By: Genevie Ann M.D.   On: 03/03/2019 19:59   Medications: Infusions: . sodium chloride Stopped (03/03/19 0440)  . ferumoxytol Stopped (02/28/19 1534)    Scheduled Medications: . allopurinol  100 mg Oral Daily  . amLODipine  10 mg Oral Daily  . Chlorhexidine Gluconate Cloth  6 each Topical Daily  . dorzolamide  1 drop Left Eye QPM  . fluorometholone  1 drop Right Eye TID  . heparin  5,000  Units Subcutaneous Q8H  . insulin aspart  0-9 Units Subcutaneous TID WC  . isosorbide-hydrALAZINE  1 tablet Oral TID  . latanoprost  1 drop Left Eye QHS  . mouth rinse  15 mL Mouth Rinse BID  . pantoprazole  40 mg Oral Daily  . pregabalin  50 mg Oral Daily    have reviewed scheduled and prn medications.  Physical Exam: General: elderly female in bed in NAD at rest  Heart: S1S2; no rub  Lungs: clear to auscultation bilaterally; normal work of breathing at rest  Abdomen: soft/NT/ND Extremities: no edema lower extremities  Neuro - awake and alert, conversant     03/05/2019,10:27 AM  LOS: 7 days

## 2019-03-05 NOTE — Progress Notes (Signed)
Transfer Note:   Traveling Method: [facility] Transferring Unit: [facility] ICU Mental Orientation: A&O X4 Telemetry:  Initiated Assessment:  WDL Skin: Intact IV: WDL Pain: Denies Safety Measures: Safety Fall Prevention Plan has been given, discussed and signed Admission: Completed 41M Orientation: Patient has been orientated to the room, unit and staff.   Orders have been reviewed and implemented. Will continue to monitor the patient. Call light has been placed within reach and bed alarm has been activated.   Aneta Mins BSN, RN3

## 2019-03-05 NOTE — Progress Notes (Addendum)
[name]   DOB:Jan 02, 1930   VO#:[id number]    ASSESSMENT & PLAN:  #1Elevated SPEP and light chainsconsistent with multiple myeloma  Her current work-up is incomplete; recent labs confirmed IgG kappa myeloma Skeletal survey is reviewed which show no evidence of impeding bone fractures Bone marrow biopsy was performed on 03/04/2019. Specimens sent for FISH, cytogenetics and additional studies.  Results pending.  She has completed 4 days of dexamethasone with improvement of her bone pain and hypercalcemia She was started on Velcade on 03/04/2019 following her bone marrow biopsy Due to her multiple comorbidities, she will receive Velcade q. 7 days, next dose due next week We discussed the risk, benefits, side effects of Velcade including pancytopenia, neuropathy, infection and others and she agreed to proceed. The patient tolerated her first dose well overall.  Plan to continue her Velcade weekly.  #2 Anemia Likely due to her underlying bone marrow disorder She was transfused on 03/02/2019 Monitor closely, recheck tomorrow No need transfusion today  #3 Malignant hypercalcemia, resolving She appears to be responding to steroids and malignant help her calcium is resolving Observe for now  #4 Acute renal failure She has acute on chronic renal failure due to undiagnosed multiple myeloma I am hopeful that high-dose corticosteroid therapy might improve her situation Appreciate nephrology follow-up She was given 1 dose of rasburicase on 03/03/2019 We discussed the possibility of hemodialysis and I will defer to nephrology for management  #5Chest pain with elevated troponin, resolved Echocardiogram show no evidence of cardiomyopathy Her chest pain could be triggered by hyperviscosity related to untreated multiple myeloma. Appreciate cardiology follow-up  #6 Severe bone pain, resolved Likely due to her underlying bone marrow disorder and hypercalcemia Skeletal survey showed no  evidence of impeding bone fracture  #7 constipation, resolved Likely due to malignant hypercalcemia Continue aggressive laxative therapy  All questions were answered. The patient knows to call the clinic with any problems, questions or concerns. Family has been updated today of the plan    Mikey Bussing, NP 03/05/2019 9:21 AM I have seen and examined the patient and updated the notes above Heath Lark, MD Subjective: The patient feels well overall this morning.  Bowels move this morning but only a small amount.  She remains on MiraLAX.  She has no nausea or vomiting.  Denies chest Discomfort and shortness of breath.  She offers no other complaints today.   Objective:  Vitals:   03/05/19 0600 03/05/19 0800  BP: (!) 127/49 (!) 122/45  Pulse: 70 69  Resp: 12 16  Temp:  98.2 F (36.8 C)  SpO2: 97% 97%     Intake/Output Summary (Last 24 hours) at 03/05/2019 0921 Last data filed at 03/04/2019 2200 Gross per 24 hour  Intake 360 ml  Output 500 ml  Net -140 ml    GENERAL:alert, no distress and comfortable HEENT: No mucositis or thrush. LUNGS: Clear to auscultation bilaterally CV: S1, S2.  Regular.  No murmur.  No lower extremity edema. NEURO: alert & oriented x 3 with fluent speech, no focal motor/sensory deficits   Labs:  Recent Labs    02/25/19 1103 02/26/19 0353  03/03/19 0239 03/04/19 0310 03/05/19 0251  NA 137 138   < > 134* 134* 134*  K 3.1* 3.5   < > 4.2 4.2 3.7  CL 101 104   < > 103 102 103  CO2 23 23   < > 19* 21* 22  GLUCOSE 127* 88   < > 129* 123* 94  BUN  50* 45*   < > 72* 83* 92*  CREATININE 4.46* 4.21*   < > 4.16* 4.54* 4.77*  CALCIUM 10.8* 10.2   < > 9.2 9.0 8.5*  GFRNONAA 8* 9*   < > 9* 8* 8*  GFRAA 10* 10*   < > 11* 9* 9*  PROT 10.7* 9.4*  --   --   --   --   ALBUMIN 3.0* 2.5*   < > 2.5* 2.5* 2.1*  AST 18 18  --   --   --   --   ALT 12 13  --   --   --   --   ALKPHOS 30* 26*  --   --   --   --   BILITOT 0.8 0.3  --   --   --   --    < > = values  in this interval not displayed.    Studies:  Dg Chest 1 View  Result Date: 02/25/2019 CLINICAL DATA:  Acute kidney injury and cough. EXAM: CHEST  1 VIEW COMPARISON:  None. FINDINGS: Left chest wall pacer device is noted with leads in the right atrial appendage and right ventricle. Mild cardiac enlargement. Aortic atherosclerosis. No pleural effusion or edema. No airspace opacities. IMPRESSION: 1. Cardiac enlargement.  No acute findings. 2.  Aortic Atherosclerosis (ICD10-I70.0). Electronically Signed   By: Kerby Moors M.D.   On: 02/25/2019 13:51   Dg Chest 2 View  Result Date: 03/01/2019 CLINICAL DATA:  Hypertension.  Diabetes. EXAM: CHEST - 2 VIEW COMPARISON:  Feb 28, 2019 FINDINGS: Stable cardiomegaly. The hila and mediastinum are unremarkable. Mild atelectasis in the left base. Mild patchy opacity in the right lung, unchanged. No other acute changes. IMPRESSION: Stable patchy opacities in the right lung. No other interval changes. Electronically Signed   By: Dorise Bullion III M.D   On: 03/01/2019 17:04   US Renal  Result Date: 02/25/2019 CLINICAL DATA:  85 year old female with acute renal failure EXAM: RENAL / URINARY TRACT ULTRASOUND COMPLETE COMPARISON:  None. FINDINGS: Right Kidney: Renal measurements: 11.9 x 4.1 x 4.3 cm = volume: 111 mL . Echogenicity within normal limits. No mass or hydronephrosis visualized. Left Kidney: Renal measurements: 12.5 x 4.2 x 4.2 cm = volume: 115 mL. Two renal cysts are present measuring 1.2 cm. Echogenicity within normal limits. No mass or hydronephrosis visualized. Bladder: Appears normal for degree of bladder distention, but bladder not well distended. IMPRESSION: 1. Unremarkable kidneys except for 2 small LEFT renal cysts. No evidence of hydronephrosis. Electronically Signed   By: Margarette Canada M.D.   On: 02/25/2019 16:00   Dg Chest Port 1 View  Result Date: 02/28/2019 CLINICAL DATA:  Dyspnea. EXAM: PORTABLE CHEST 1 VIEW COMPARISON:  Radiograph of Feb 25, 2019. FINDINGS: Stable cardiomegaly. Atherosclerosis of thoracic aorta is noted. Left-sided pacemaker is unchanged in position. No pneumothorax or pleural effusion is noted. Left lung is clear. Mildly increased right lung opacities are noted concerning for either asymmetric edema or possibly pneumonia. Severe degenerative changes seen involving the right glenohumeral joint. IMPRESSION: Mildly increased right lung opacities are noted concerning for possible asymmetric edema or possibly pneumonia. Aortic Atherosclerosis (ICD10-I70.0). Electronically Signed   By: Marijo Conception M.D.   On: 02/28/2019 12:26   Dg Bone Survey Met  Result Date: 03/03/2019 CLINICAL DATA:  85 year old female with multiple myeloma, pain radiating through the right leg. EXAM: METASTATIC BONE SURVEY COMPARISON:  CT lumbar myelogram 06/19/2018 FINDINGS: Scattered round lucent lesions in  the skull. Degenerative changes in the cervical spine where bone mineralization appears within normal limits. Degenerative changes in the thoracic spine and lumbar spine. No spinal compression fracture identified. Spine bone mineralization appears within normal limits. Previous lower lumbar interbody fusion. Calcified aortic atherosclerosis. Questionable heterogeneous lucency in the iliac wings. Femoral heads are normally located and hip joint spaces are normal for age. Pubic rami appear intact. No definite lytic lesion in the right lower extremity. Total right knee arthroplasty. There is an oval lytic lesion in the distal left femur measuring 11 millimeters (arrow). Prior left knee arthroplasty also. Elsewhere in the left lower extremity bone mineralization appears normal for age. Advanced degenerative changes at the right shoulder and wrist. There are lucent lesions in the distal right humerus. Cardiomegaly with left chest cardiac pacemaker. No discrete rib lesion identified. Small lucent lesions in the mid and distal humerus. Mild to moderate degenerative  changes in the left upper extremity. No pathologic fracture identified. IMPRESSION: 1. Round and oval lucent/lytic lesions in the skull, bilateral humeri, distal left femur, and possibly also in the pelvis are compatible with multiple myeloma. No right lower extremity lytic lesion identified. No pathologic fracture. 2. Advanced degenerative changes in the spine with no vertebral myeloma evident by plain radiography. 3. Advanced degenerative changes in the right upper extremity. Bilateral knee arthroplasty. 4.  Aortic Atherosclerosis (ICD10-I70.0). Electronically Signed   By: Genevie Ann M.D.   On: 03/03/2019 19:59

## 2019-03-05 NOTE — Progress Notes (Signed)
PT Cancellation Note  Patient Details Name: [name] MRN: [id number] DOB: [birth year]   Cancelled Treatment:    Reason Eval/Treat Not Completed: Medical issues which prohibited therapy--pt had vasovagal episode earlier today with nursing. Also pt is being transferred to [facility] later today. Will hold PT today.    Weston Anna, PT Acute Rehabilitation Services Pager: [phone] Office: [phone]

## 2019-03-05 NOTE — Progress Notes (Signed)
PROGRESS NOTE    [name]  TIW:[id number] DOB: [birth year] DOA: 02/25/2019 PCP: Lucianne Lei, MD      Brief Narrative:  [name] is a 85 y.o. F with hx DM, HTN, OSA who presented with abnormal labs from PCP, new renal failure.  Had had subacute fatigue.  PCP saw her, did labs and renal function was decreased, sent to ER.         Assessment & Plan:  Acute renal failure In setting of MM new diagnosis, now s/p dexamethasone, also some NSAID use and ARB use PTA.    UA bland.  Renal US normal.  P/C only 1.8, consistent with known DM, HTN.    Had some pulmonary edema on CXR previously, but no swelling or hypoxia now.  BUNCr worsening again today.  K normal.  Bicarb okay.  UOP 500 cc yesterday.  -Will need to observe for measuring UOP, monitoring for needs for HD -Transfer to [facility] per Dr. Royce Macadamia, for potential HD -Consult Nephrology, appreciate expert guidance -Hold ARB  Multiple myeloma Renal failure on admission.  SPEP showed Mspike.  Skeletal survey urnemarkable.  Bone marrow biopsy 5/19, pending.  Got 4 days dexamethasone.  Got bortezomib 5/19 -Consult oncology, appreciate cares  Hypercalcemia From MM.  Improved/resolved with dex.  Hypertension BP normal -Continue amlodipine, Isordil -Hold telmisartan  Diabetes with neuropathy Glucoses good -Continue SSI corrections -Continue Lyrica -Hold home Amaryl  Gout -Continue allopurinol  Anemia of malignancy Hgb stable today, no clinical bleeding.  Rcvd transfusion of PRBCs x1 on 5/17.  Got Feraheme once 5/15, repeat dose pending.  Hypokalemia Resolved  Other medicaitons -Continue eye drops -Continue PPI   Hyponatremia Mild, asymptomatic  Elevated troponin In setting of renal failure.  No chest pain.  Doubt ACS        MDM and disposition: The below labs and imaging reports were reviewed and summarized above.  Medication management as above.  The patient was admitted with malaise, found to  have renal failure, likely from multiple myeloma.  Her creatinine is worsening, although her urine output is okay.  We will monitor for at least next several days to evaluate for progression to need acute HD.  Given she needs transfer OO ICU today, will go ahead and transfer to [facility] so that she has renal services readily available if need for HD arose.    PT recommend HHPT and aide.        DVT prophylaxis: Heparin Code Status: FULL Family Communication: Daughter by phone    Consultants:   Nephrology  Oncology  Procedures:   Bone marrow biopsy 5/19  Renal US 5/12  Bone survey 5/18  Antimicrobials:   None    Subjective: Feeling well.  Some orthopnea, and OOB with any exertion, but no swelling.  No respiratory distress.  No fever.  No confusion, vomiting, nausea.  No seizure.  Objective: Vitals:   03/05/19 0000 03/05/19 0200 03/05/19 0400 03/05/19 0600  BP: (!) 101/48 (!) 113/44 (!) 115/45 (!) 127/49  Pulse: 73 70 73 70  Resp: _0 Temp: (!) 97.4 F (36.3 C)  98.3 F (36.8 C)   TempSrc: Oral  Oral   SpO2: 97% 94% 97% 97%  Weight:      Height:        Intake/Output Summary (Last 24 hours) at 03/05/2019 0744 Last data filed at 03/04/2019 2200 Gross per 24 hour  Intake 360 ml  Output 500 ml  Net -140 ml   Filed  Weights   02/25/19 1012  Weight: 81.2 kg    Examination: General appearance: elderly adult female, alert and in no acute distress.   HEENT: Anicteric, conjunctiva pink, lids and lashes normal. No nasal deformity, discharge, epistaxis.  Lips moist.   Skin: Warm and dry.   No suspicious rashes or lesions. Cardiac: RRR, nl S1-S2, no murmurs appreciated.  Capillary refill is brisk.  JVP normal.  No LE edema.  Radial pulses 2+ and symmetric. Respiratory: Normal respiratory rate and rhythm.  CTAB without rales or wheezes. Abdomen: Abdomen soft.  no TTP. No ascites, distension, hepatosplenomegaly.   MSK: No deformities or effusions of large  joints, normal muscle bulk and tone. Neuro: Awake and alert.  EOMI, moves all extremities. Speech fluent.    Psych: Sensorium intact and responding to questions, attention normal. Affect normal.  Judgment and insight appear normal.    Data Reviewed: I have personally reviewed following labs and imaging studies:  CBC: Recent Labs  Lab 02/28/19 0443 03/02/19 0300 03/02/19 1446 03/03/19 0239 03/04/19 0310 03/05/19 0251  WBC 5.0 7.6  --  6.8 6.7 6.6  NEUTROABS  --  6.1  --   --  5.0  --   HGB 7.7* 6.8* 8.2* 7.6* 7.9* 8.2*  HCT 24.3* 21.2* 24.9* 23.3* 24.9* 26.2*  MCV 93.5 91.4  --  88.6 89.6 90.7  PLT 164 162  --  157 177 299   Basic Metabolic Panel: Recent Labs  Lab 03/01/19 0258 03/02/19 0300 03/03/19 0239 03/04/19 0310 03/05/19 0251  NA 137 137 134* 134* 134*  K 4.1 4.2 4.2 4.2 3.7  CL 105 103 103 102 103  CO2 20* 21* 19* 21* 22  GLUCOSE 158* 137* 129* 123* 94  BUN 43* 57* 72* 83* 92*  CREATININE 3.97* 4.15* 4.16* 4.54* 4.77*  CALCIUM 9.9 9.8 9.2 9.0 8.5*  PHOS 4.9* 3.9 3.6 3.2 3.3   GFR: Estimated Creatinine Clearance: 8.9 mL/min (A) (by C-G formula based on SCr of 4.77 mg/dL (H)). Liver Function Tests: Recent Labs  Lab 03/01/19 0258 03/02/19 0300 03/03/19 0239 03/04/19 0310 03/05/19 0251  ALBUMIN 2.4* 2.4* 2.5* 2.5* 2.1*   No results for input(s): LIPASE, AMYLASE in the last 168 hours. No results for input(s): AMMONIA in the last 168 hours. Coagulation Profile: No results for input(s): INR, PROTIME in the last 168 hours. Cardiac Enzymes: Recent Labs  Lab 02/28/19 1000 02/28/19 1504 02/28/19 2118  TROPONINI 0.12* 0.15* 0.20*   BNP (last 3 results) No results for input(s): PROBNP in the last 8760 hours. HbA1C: No results for input(s): HGBA1C in the last 72 hours. CBG: Recent Labs  Lab 03/03/19 2201 03/04/19 0836 03/04/19 1202 03/04/19 1718 03/04/19 2143  GLUCAP 148* 131* 155* 125* 133*   Lipid Profile: No results for input(s): CHOL, HDL,  LDLCALC, TRIG, CHOLHDL, LDLDIRECT in the last 72 hours. Thyroid Function Tests: No results for input(s): TSH, T4TOTAL, FREET4, T3FREE, THYROIDAB in the last 72 hours. Anemia Panel: No results for input(s): VITAMINB12, FOLATE, FERRITIN, TIBC, IRON, RETICCTPCT in the last 72 hours. Urine analysis:    Component Value Date/Time   COLORURINE STRAW (A) 02/25/2019 1103   APPEARANCEUR CLEAR 02/25/2019 1103   LABSPEC 1.008 02/25/2019 1103   PHURINE 6.0 02/25/2019 1103   GLUCOSEU NEGATIVE 02/25/2019 1103   HGBUR SMALL (A) 02/25/2019 1103   BILIRUBINUR NEGATIVE 02/25/2019 1103   KETONESUR NEGATIVE 02/25/2019 1103   PROTEINUR 30 (A) 02/25/2019 1103   UROBILINOGEN 1.0 10/05/2012 1953   NITRITE NEGATIVE 02/25/2019  [place] (A) 02/25/2019 1103   Sepsis Labs: _0 (procalcitonin:4,lacticacidven:4)  ) Recent Results (from the past 240 hour(s))  SARS Coronavirus 2 (CEPHEID - Performed in [place] hospital lab), Hosp Order     Status: None   Collection Time: 02/25/19 11:58 AM  Result Value Ref Range Status   SARS Coronavirus 2 NEGATIVE NEGATIVE Final    Comment: (NOTE) If result is NEGATIVE SARS-CoV-2 target nucleic acids are NOT DETECTED. The SARS-CoV-2 RNA is generally detectable in upper and lower  respiratory specimens during the acute phase of infection. The lowest  concentration of SARS-CoV-2 viral copies this assay can detect is 250  copies / mL. A negative result does not preclude SARS-CoV-2 infection  and should not be used as the sole basis for treatment or other  patient management decisions.  A negative result may occur with  improper specimen collection / handling, submission of specimen other  than nasopharyngeal swab, presence of viral mutation(s) within the  areas targeted by this assay, and inadequate number of viral copies  (<250 copies / mL). A negative result must be combined with clinical  observations, patient history, and epidemiological  information. If result is POSITIVE SARS-CoV-2 target nucleic acids are DETECTED. The SARS-CoV-2 RNA is generally detectable in upper and lower  respiratory specimens dur ing the acute phase of infection.  Positive  results are indicative of active infection with SARS-CoV-2.  Clinical  correlation with patient history and other diagnostic information is  necessary to determine patient infection status.  Positive results do  not rule out bacterial infection or co-infection with other viruses. If result is PRESUMPTIVE POSTIVE SARS-CoV-2 nucleic acids MAY BE PRESENT.   A presumptive positive result was obtained on the submitted specimen  and confirmed on repeat testing.  While 2019 novel coronavirus  (SARS-CoV-2) nucleic acids may be present in the submitted sample  additional confirmatory testing may be necessary for epidemiological  and / or clinical management purposes  to differentiate between  SARS-CoV-2 and other Sarbecovirus currently known to infect humans.  If clinically indicated additional testing with an alternate test  methodology [id number]) is advised. The SARS-CoV-2 RNA is generally  detectable in upper and lower respiratory sp ecimens during the acute  phase of infection. The expected result is Negative. Fact Sheet for Patients:  StrictlyIdeas.no Fact Sheet for Healthcare Providers: BankingDealers.co.za This test is not yet approved or cleared by the [place] FDA and has been authorized for detection and/or diagnosis of SARS-CoV-2 by FDA under an Emergency Use Authorization (EUA).  This EUA will remain in effect (meaning this test can be used) for the duration of the COVID-19 declaration under Section 564(b)(1) of the Act, 21 U.S.C. section 360bbb-3(b)(1), unless the authorization is terminated or revoked sooner. Performed at [facility], [place] [address], [place], [place] [address]   Culture, Urine      Status: Abnormal   Collection Time: 02/26/19  7:02 PM  Result Value Ref Range Status   Specimen Description   Final    URINE, CLEAN CATCH Performed at [facility], [place] [address], [place], [place] [address]    Special Requests   Final    NONE Performed at [facility], [place] [address] [place], [place] [address]    Culture (A)  Final    50,000 COLONIES/mL LACTOBACILLUS SPECIES Standardized susceptibility testing for this organism is not available. Performed at [facility], [place] [address], [place],  [address]  Report Status 02/27/2019 FINAL  Final  MRSA PCR Screening     Status: None   Collection Time: 02/28/19 12:45 PM  Result Value Ref Range Status   MRSA by PCR NEGATIVE NEGATIVE Final    Comment:        The GeneXpert MRSA Assay (FDA approved for NASAL specimens only), is one component of a comprehensive MRSA colonization surveillance program. It is not intended to diagnose MRSA infection nor to guide or monitor treatment for MRSA infections. Performed at [facility], [place] [address], [place], [place] [address]          Radiology Studies: Dg Bone Survey Met  Result Date: 03/03/2019 CLINICAL DATA:  85 year old female with multiple myeloma, pain radiating through the right leg. EXAM: METASTATIC BONE SURVEY COMPARISON:  CT lumbar myelogram 06/19/2018 FINDINGS: Scattered round lucent lesions in the skull. Degenerative changes in the cervical spine where bone mineralization appears within normal limits. Degenerative changes in the thoracic spine and lumbar spine. No spinal compression fracture identified. Spine bone mineralization appears within normal limits. Previous lower lumbar interbody fusion. Calcified aortic atherosclerosis. Questionable heterogeneous lucency in the iliac wings. Femoral heads are normally located and hip joint spaces are normal for age. Pubic rami appear intact. No definite lytic  lesion in the right lower extremity. Total right knee arthroplasty. There is an oval lytic lesion in the distal left femur measuring 11 millimeters (arrow). Prior left knee arthroplasty also. Elsewhere in the left lower extremity bone mineralization appears normal for age. Advanced degenerative changes at the right shoulder and wrist. There are lucent lesions in the distal right humerus. Cardiomegaly with left chest cardiac pacemaker. No discrete rib lesion identified. Small lucent lesions in the mid and distal humerus. Mild to moderate degenerative changes in the left upper extremity. No pathologic fracture identified. IMPRESSION: 1. Round and oval lucent/lytic lesions in the skull, bilateral humeri, distal left femur, and possibly also in the pelvis are compatible with multiple myeloma. No right lower extremity lytic lesion identified. No pathologic fracture. 2. Advanced degenerative changes in the spine with no vertebral myeloma evident by plain radiography. 3. Advanced degenerative changes in the right upper extremity. Bilateral knee arthroplasty. 4.  Aortic Atherosclerosis (ICD10-I70.0). Electronically Signed   By: Genevie Ann M.D.   On: 03/03/2019 19:59        Scheduled Meds: . allopurinol  100 mg Oral Daily  . amLODipine  10 mg Oral Daily  . Chlorhexidine Gluconate Cloth  6 each Topical Daily  . dorzolamide  1 drop Left Eye QPM  . fluorometholone  1 drop Right Eye TID  . heparin  5,000 Units Subcutaneous Q8H  . insulin aspart  0-9 Units Subcutaneous TID WC  . isosorbide-hydrALAZINE  1 tablet Oral TID  . latanoprost  1 drop Left Eye QHS  . mouth rinse  15 mL Mouth Rinse BID  . pantoprazole  40 mg Oral Daily  . pregabalin  50 mg Oral Daily   Continuous Infusions: . sodium chloride Stopped (03/03/19 0440)  . ferumoxytol Stopped (02/28/19 1534)     LOS: 7 days    Time spent: 25 minutes    Edwin Dada, MD [facility] 03/05/2019, 7:44 AM     Please page through  [place]:  [email or web address] Password [email or web address] If 7PM-7AM, please contact night-coverage

## 2019-03-05 NOTE — Progress Notes (Signed)
[facility] contacted transport requested.

## 2019-03-05 NOTE — Progress Notes (Signed)
Patient to transfer to [facility] report given to receiving nurse, all questions answered at this time.  Pt. VSS with no s/s of distress noted.  Patient stable at transfer.  Transport notified.

## 2019-03-06 DIAGNOSIS — K5909 Other constipation: Secondary | ICD-10-CM

## 2019-03-06 LAB — RENAL FUNCTION PANEL
Albumin: 1.9 g/dL — ABNORMAL LOW (ref 3.5–5.0)
Anion gap: 12 (ref 5–15)
BUN: 87 mg/dL — ABNORMAL HIGH (ref 8–23)
CO2: 20 mmol/L — ABNORMAL LOW (ref 22–32)
Calcium: 8.3 mg/dL — ABNORMAL LOW (ref 8.9–10.3)
Chloride: 100 mmol/L (ref 98–111)
Creatinine, Ser: 5.12 mg/dL — ABNORMAL HIGH (ref 0.44–1.00)
GFR calc Af Amer: 8 mL/min — ABNORMAL LOW (ref >60)
GFR calc non Af Amer: 7 mL/min — ABNORMAL LOW (ref >60)
Glucose, Bld: 89 mg/dL (ref 70–99)
Potassium: 3.6 mmol/L (ref 3.5–5.1)
Sodium: 132 mmol/L — ABNORMAL LOW (ref 135–145)

## 2019-03-06 LAB — CBC
HCT: 25.7 % — ABNORMAL LOW (ref 36.0–46.0)
Hemoglobin: 8.3 g/dL — ABNORMAL LOW (ref 12.0–15.0)
MCH: 27.9 pg (ref 26.0–34.0)
MCHC: 32.3 g/dL (ref 30.0–36.0)
MCV: 86.5 fL (ref 80.0–100.0)
Platelets: 194 10*3/uL (ref 150–400)
RBC: 2.97 MIL/uL — ABNORMAL LOW (ref 3.87–5.11)
RDW: 15.9 % — ABNORMAL HIGH (ref 11.5–15.5)
WBC: 7.1 10*3/uL (ref 4.0–10.5)
nRBC: 1 % — ABNORMAL HIGH (ref 0.0–0.2)

## 2019-03-06 LAB — VITAMIN D 25 HYDROXY (VIT D DEFICIENCY, FRACTURES): Vit D, 25-Hydroxy: 28 ng/mL — ABNORMAL LOW (ref 30.0–100.0)

## 2019-03-06 LAB — GLUCOSE, CAPILLARY
Glucose-Capillary: 71 mg/dL (ref 70–99)
Glucose-Capillary: 74 mg/dL (ref 70–99)
Glucose-Capillary: 81 mg/dL (ref 70–99)
Glucose-Capillary: 77 mg/dL (ref 70–99)

## 2019-03-06 MED ORDER — VITAMIN D 25 MCG (1000 UNIT) PO TABS
2000.0000 [IU] | ORAL_TABLET | Freq: Every day | ORAL | Status: DC
  Administered 2019-03-06 – 2019-03-11 (×6): 2000 [IU] via ORAL
  Filled 2019-03-06 (×6): qty 2

## 2019-03-06 MED ORDER — SENNOSIDES-DOCUSATE SODIUM 8.6-50 MG PO TABS
2.0000 | ORAL_TABLET | Freq: Two times a day (BID) | ORAL | Status: DC
  Administered 2019-03-06 – 2019-03-11 (×11): 2 via ORAL
  Filled 2019-03-06 (×11): qty 2

## 2019-03-06 MED ORDER — VANCOMYCIN HCL IN DEXTROSE 1-5 GM/200ML-% IV SOLN
1000.0000 mg | INTRAVENOUS | Status: AC
  Administered 2019-03-07: 1000 mg via INTRAVENOUS
  Filled 2019-03-06: qty 200

## 2019-03-06 MED ORDER — CHLORHEXIDINE GLUCONATE CLOTH 2 % EX PADS: 6.0000 | MEDICATED_PAD | Freq: Every day | CUTANEOUS | Status: DC

## 2019-03-06 MED ORDER — POLYETHYLENE GLYCOL 3350 17 G PO PACK
17.0000 g | PACK | Freq: Every day | ORAL | Status: DC
  Administered 2019-03-06 – 2019-03-11 (×6): 17 g via ORAL
  Filled 2019-03-06 (×6): qty 1

## 2019-03-06 NOTE — Progress Notes (Signed)
[facility] PROGRESS NOTE  [name] EZM:[id number] DOB: 1930/03/17 DOA: 02/25/2019 PCP: Lucianne Lei, MD   Narrative: 85 year old female known type II diabetic OSA on CPAP sick sinus syndrome status post PPM HTN presented to emergency room 02/25/2019 abnormal labs Noted decreased U OP + constipation-significant history for Motrin Tylenol tramadol-also noted to be on HCTZ at home BUN/creatinine 50/4.4 on admit UA straw-colored 30 of protein hypertensive 150s over 60s Nephrology input = multifactorial prerenal insults-nonoliguric-work-up started and eventually revealed elevated M spike light chains anemia hypercalcemia prompting oncology input Oncological work-up performed-started on dexamethasone short-term corticosteroids rasburicase etc. Cardiology consulted 5/16 for pulmonary edema secondary to volume overload-placed on IV nitroglycerin  Patient was eventually transferred to [facility] 5/21 for possible initiation of HD   A & Plan AKI Hyponatremia/Hypokalemia Multifactorial-myeloma + prior insults over baseline of diabetic neuropathy dialysis planning started-Access being considered Appreciate Renal/IR input Hyperkalemia is resolving-labs in a.m. Multiple myeloma-IgG type Malignant Hypercalcemia 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 M spike on admit-Skel survery shows lucencies--Received Dexamethasone/ Bortezomib  Calcium is now lower--rpl  with am labs transfused x 1 on 5/17, IV iron 5/15 --further planning as per Dr. Alvy Bimler HTN Continuing BiDil 1 tab 3 times daily amlodipine 10 daily PRN hydralazine 10 mg IV every 6 as needed for pressure over 160 Controlled at this time Diabetes + neuropathy nephropathy CBG's ith sugars in the 70's Gout  stable at this time  DVT [place]  Code Status: FULL  Communication: none at bedside patient is awake coherent and understands the plan of care at this time I have mentioned I will be happy to discuss with patient and family once access  is placed and further planning per nephrology is consolidated in terms of needs for dialysis in the next 1 to 2 days disposition Plan: unclear-she will probably need dialysis and be here through the weekend may need to be clipped to an outpatient facility   Verlon Au, MD  [facility] [place] [email or web address] 7PM-7AM contact night coverage as above 03/06/2019, 1:53 PM  LOS: 8 days   Consultants:  Surgery  Nephrology  Procedures:   Bone marrow biopsy 5/19  Renal US 5/12  Bone survey 5/18  Antimicrobials:   None   Interval history/Subjective: Sleepy this afternoon had a good lunch No specific pain Verbalizes clearly what she has been told by multiple other consultants Tolerating diet No constipation  Objective:  Vitals:  Vitals:   03/06/19 0532 03/06/19 1001  BP: (!) 119/50 (!) 112/47  Pulse: 69 68  Resp: 18 18  Temp: 97.6 F (36.4 C) 98.4 F (36.9 C)  SpO2: 97% 95%    Exam:  Arcus senilis present pupils equally reactive Chest is clear no added sound Abdomen is soft nontender no rebound no guarding-slightly obese tympanitic but bowel sounds heard S1-S2 no murmur telemetry is benign Lower extremities are soft nontender no edema range of motion is intact Euthymic and congruent     I have personally reviewed the following:  DATA   Labs:  Creatinine is up to 87/5.1 CO2 is 20 hemoglobin down to 8.3  Scheduled Meds: . allopurinol  100 mg Oral Daily  . amLODipine  10 mg Oral Daily  . Chlorhexidine Gluconate Cloth  6 each Topical Daily  . cholecalciferol  2,000 Units Oral Daily  . dorzolamide  1 drop Left Eye QPM  . fluorometholone  1 drop Right Eye TID  . heparin  5,000 Units Subcutaneous Q8H  . insulin aspart  0-9 Units  Subcutaneous TID WC  . isosorbide-hydrALAZINE  1 tablet Oral TID  . latanoprost  1 drop Left Eye QHS  . mouth rinse  15 mL Mouth Rinse BID  . pantoprazole  40 mg Oral Daily  . polyethylene glycol  17 g Oral Daily  .  pregabalin  50 mg Oral Daily  . senna-docusate  2 tablet Oral BID   Continuous Infusions: . sodium chloride Stopped (03/03/19 0440)  . ferumoxytol Stopped (02/28/19 1534)  . [START ON 03/07/2019] vancomycin      Active Problems:   AKI (acute kidney injury) ([place])   Hypokalemia   LOS: 8 days

## 2019-03-06 NOTE — Care Management Important Message (Signed)
Important Message  Patient Details  Name: [name] MRN: [id number] Date of Birth: June 25, 1930   Medicare Important Message Given:  Yes    Orbie Pyo 03/06/2019, 2:08 PM

## 2019-03-06 NOTE — Progress Notes (Signed)
Physical Therapy Treatment Patient Details Name: [name] MRN: [id number] DOB: [birth year] Today's Date: 03/06/2019    History of Present Illness 85 yo female admitted with AKI, new diagnosis of multiple myeloma, pulmonary edema. Hx of bil knee surgeries, OA, DM gout neuropathy, SSS, pacemaker anemia    PT Comments    Patient doing well with therapy today, ambulating 100' with RW and contact guard. Pt c/o of chronic back and hip pain. VSS on RA, no lightheadedness during visit.   Follow Up Recommendations  Home health PT;Supervision for mobility/OOB(home health aide)     Equipment Recommendations  (TBD)    Recommendations for Other Services       Precautions / Restrictions Precautions Precautions: Fall Restrictions Weight Bearing Restrictions: No    Mobility  Bed Mobility Overal bed mobility: Needs Assistance       Supine to sit: Supervision        Transfers Overall transfer level: Needs assistance   Transfers: Sit to/from Stand Sit to Stand: Supervision         General transfer comment: close supervision to stand  Ambulation/Gait Ambulation/Gait assistance: Min guard Gait Distance (Feet): 100 Feet Assistive device: Rolling walker (2 wheeled) Gait Pattern/deviations: Step-to pattern     General Gait Details: slow gait,    Stairs             Wheelchair Mobility    Modified Rankin (Stroke Patients Only)       Balance Overall balance assessment: Needs assistance   Sitting balance-Leahy Scale: Fair     Standing balance support: Single extremity supported Standing balance-Leahy Scale: Poor                              Cognition Arousal/Alertness: Awake/alert Behavior During Therapy: WFL for tasks assessed/performed Overall Cognitive Status: Within Functional Limits for tasks assessed                                        Exercises      General Comments        Pertinent Vitals/Pain Pain  Assessment: Faces Faces Pain Scale: Hurts even more Pain Location: back, bil knees Pain Descriptors / Indicators: Aching;Sore Pain Intervention(s): Limited activity within patient's tolerance    Home Living                      Prior Function            PT Goals (current goals can now be found in the care plan section) Acute Rehab PT Goals Patient Stated Goal: get stronger PT Goal Formulation: With patient Time For Goal Achievement: 03/17/19 Potential to Achieve Goals: Fair Progress towards PT goals: Progressing toward goals    Frequency    Min 3X/week      PT Plan Current plan remains appropriate    Co-evaluation              AM-PAC PT "6 Clicks" Mobility   Outcome Measure  Help needed turning from your back to your side while in a flat bed without using bedrails?: A Little Help needed moving from lying on your back to sitting on the side of a flat bed without using bedrails?: A Little Help needed moving to and from a bed to a chair (including a wheelchair)?: A Little Help needed standing up from  a chair using your arms (e.g., wheelchair or bedside chair)?: A Little Help needed to walk in hospital room?: A Little Help needed climbing 3-5 steps with a railing? : A Little 6 Click Score: 18    End of Session Equipment Utilized During Treatment: Gait belt Activity Tolerance: Patient tolerated treatment well Patient left: in chair;with call bell/phone within reach Nurse Communication: Mobility status PT Visit Diagnosis: Muscle weakness (generalized) (M62.81);Unsteadiness on feet (R26.81)     Time: 1030-1050 PT Time Calculation (min) (ACUTE ONLY): 20 min  Charges:  $Gait Training: 8-22 mins                     Reinaldo Berber, PT, DPT Acute Rehabilitation Services Pager: [phone] Office: [phone]     Reinaldo Berber 03/06/2019, 10:57 AM

## 2019-03-06 NOTE — Consult Note (Signed)
Chief Complaint: Patient was seen in consultation today for tunneled dialysis catheter placement Chief Complaint  Patient presents with   abnormal labs   at the request of Dr Mickel Crow  Supervising Physician: Markus Daft  Patient Status: [facility] - In-pt  History of Present Illness: [name] is a 85 y.o. female   New dx Multiple Myeloma Acute kidney injury Worsening renal function- no recovery  Renal note today:  1. AKI - presumed secondary to multiple myeloma/ nausea-vomiting/ NSAIDS/ ARB. UA not suggestive of GN.  Renal ultrasound unremarkable.  Protein to crt ratio 1.8, +30 on the dip.  SPEP positive in the setting of anemia, hypercalcemia and hyperproteinemia and presumed myeloma. Note S/p decadron  - patient has early uremia and rising creat >> will go ahead and consult IR for [facility] and start dialysis tomorrow, have d/w pt and answered questions   - may or may not see improvement w/ renal fxn over time with treatment of myeloma - avoid NSAID's and ARB's/Ace inhibitors  Scheduled for tunneled dialysis catheter placement per Nephrology Plan for 5/22 in IR  Past Medical History:  Diagnosis Date   Benign paroxysmal positional vertigo 04/06/2010   Benign positional vertigo    BRADYCARDIA 01/27/2009   s/p PPM   CAROTID BRUIT 02/24/2008   DIABETES MELLITUS, TYPE II 02/24/2008   Glaucoma    HYPERTENSION 02/24/2008   Obstructive sleep apnea    Sick sinus syndrome [facility])     Past Surgical History:  Procedure Laterality Date   ABDOMINAL HYSTERECTOMY     1980's   BACK SURGERY     x 3   EP IMPLANTABLE DEVICE N/A 05/16/2016   Procedure: PPM Generator Changeout;  Surgeon: Thompson Grayer, MD;  Location: [place] [facility];  Service: Cardiovascular;  Laterality: N/A;   KNEE SURGERY Right    2003   KNEE SURGERY Left    2011   PACEMAKER INSERTION      Allergies: Aspirin; Penicillins; Brimonidine; Diltiazem hcl; Hydrocodone-acetaminophen; Morphine; and Neurontin  [gabapentin]  Medications: Prior to Admission medications   Medication Sig Start Date End Date Taking? Authorizing Provider  allopurinol (ZYLOPRIM) 100 MG tablet Take 100 mg by mouth daily.     Yes [provider]  amLODipine (NORVASC) 10 MG tablet Take 10 mg by mouth daily.   Yes [provider]  Coenzyme Q10 (CO Q 10 PO) Take 1 tablet by mouth daily.    Yes [provider]  dorzolamide (TRUSOPT) 2 % ophthalmic solution Place 1 drop into the left eye every evening.  12/21/17  Yes [provider]  fluorometholone (FML) 0.1 % ophthalmic suspension Place 1 drop into the right eye 3 (three) times daily.  02/01/19  Yes [provider]  glimepiride (AMARYL) 1 MG tablet Take 0.5 mg by mouth 2 (two) times daily.    Yes [provider]  LUMIGAN 0.01 % SOLN Place 1 drop into the left eye every evening.  02/01/19  Yes [provider]  LYRICA 50 MG capsule Take 50 mg by mouth daily.  06/14/15  Yes [provider]  Multiple Vitamin (MULTIVITAMIN WITH MINERALS) TABS tablet Take 1 tablet by mouth daily.   Yes [provider]  NARCAN 4 MG/0.1ML LIQD nasal spray kit Place 1 Dose into the nose once as needed. overdose 12/07/18  Yes [provider]  pantoprazole (PROTONIX) 40 MG tablet Take 40 mg by mouth daily. 02/20/19  Yes [provider]  polyethylene glycol (MIRALAX / GLYCOLAX)  17 g packet Take 5.6 g by mouth at bedtime.   Yes [provider]  sodium chloride (OCEAN) 0.65 % SOLN nasal spray Place 1 spray into both nostrils as needed for congestion.   Yes [provider]  sucralfate (CARAFATE) 1 GM/10ML suspension Take 10 mLs by mouth 3 times/day as needed-between meals & bedtime. Acid reflux 02/21/19  Yes [provider]  telmisartan (MICARDIS) 80 MG tablet Take 80 mg by mouth daily.   Yes [provider]  traMADol (ULTRAM) 50 MG tablet Take 50-100 mg by mouth every 12 (twelve) hours as  needed for pain. 11/25/18  Yes [provider]  hydrochlorothiazide (MICROZIDE) 12.5 MG capsule Take 1 capsule (12.5 mg total) by mouth daily. Patient not taking: Reported on 02/25/2019 03/10/15   Nita Sells, MD     Family History  Problem Relation Age of Onset   Congestive Heart Failure Mother    Tuberculosis Mother    Multiple myeloma Mother    Bone cancer Father    Arthritis Father    Breast cancer Sister    Colon cancer Sister    Hypertension Sister    Dementia Sister    Alcohol abuse Son     Social History   Socioeconomic History   Marital status: Married    Spouse name: Not on file   Number of children: 5   Years of education: Not on file   Highest education level: Not on file  Occupational History   Occupation: retired  Scientist, product/process development strain: Not on file   Food insecurity:    Worry: Not on file    Inability: Not on Lexicographer needs:    Medical: Not on file    Non-medical: Not on file  Tobacco Use   Smoking status: Never Smoker   Smokeless tobacco: Never Used  Substance and Sexual Activity   Alcohol use: No   Drug use: No   Sexual activity: Not on file  Lifestyle   Physical activity:    Days per week: Not on file    Minutes per session: Not on file   Stress: Not on file  Relationships   Social connections:    Talks on phone: Not on file    Gets together: Not on file    Attends religious service: Not on file    Active member of club or organization: Not on file    Attends meetings of clubs or organizations: Not on file    Relationship status: Not on file  Other Topics Concern   Not on file  Social History Narrative   Retired Marine scientist    Review of Systems: A 12 point ROS discussed and pertinent positives are indicated in the HPI above.  All other systems are negative.  Review of Systems  Constitutional: Positive for activity change, appetite change and fatigue. Negative for  fever.  Respiratory: Negative for cough and shortness of breath.   Cardiovascular: Negative for chest pain.  Musculoskeletal: Positive for arthralgias.  Neurological: Positive for weakness.  Psychiatric/Behavioral: Negative for behavioral problems and confusion.    Vital Signs: BP (!) 112/47 (BP Location: Left Arm)    Pulse 68    Temp 98.4 F (36.9 C) (Oral)    Resp 18    Ht _0  (1.651 m)    Wt 180 lb 8.9 oz (81.9 kg)    SpO2 95%    BMI 30.05 kg/m   Physical Exam Vitals signs reviewed.  Cardiovascular:  Rate and Rhythm: Normal rate and regular rhythm.     Heart sounds: Normal heart sounds.  Pulmonary:     Breath sounds: Normal breath sounds.  Abdominal:     General: Bowel sounds are normal.  Musculoskeletal: Normal range of motion.  Skin:    General: Skin is warm and dry.  Neurological:     Mental Status: She is alert and oriented to person, place, and time.  Psychiatric:        Mood and Affect: Mood normal.        Behavior: Behavior normal.        Thought Content: Thought content normal.        Judgment: Judgment normal.     Imaging: Dg Chest 1 View  Result Date: 02/25/2019 CLINICAL DATA:  Acute kidney injury and cough. EXAM: CHEST  1 VIEW COMPARISON:  None. FINDINGS: Left chest wall pacer device is noted with leads in the right atrial appendage and right ventricle. Mild cardiac enlargement. Aortic atherosclerosis. No pleural effusion or edema. No airspace opacities. IMPRESSION: 1. Cardiac enlargement.  No acute findings. 2.  Aortic Atherosclerosis (ICD10-I70.0). Electronically Signed   By: Kerby Moors M.D.   On: 02/25/2019 13:51   Dg Chest 2 View  Result Date: 03/01/2019 CLINICAL DATA:  Hypertension.  Diabetes. EXAM: CHEST - 2 VIEW COMPARISON:  Feb 28, 2019 FINDINGS: Stable cardiomegaly. The hila and mediastinum are unremarkable. Mild atelectasis in the left base. Mild patchy opacity in the right lung, unchanged. No other acute changes. IMPRESSION: Stable patchy  opacities in the right lung. No other interval changes. Electronically Signed   By: Dorise Bullion III M.D   On: 03/01/2019 17:04   US Renal  Result Date: 02/25/2019 CLINICAL DATA:  85 year old female with acute renal failure EXAM: RENAL / URINARY TRACT ULTRASOUND COMPLETE COMPARISON:  None. FINDINGS: Right Kidney: Renal measurements: 11.9 x 4.1 x 4.3 cm = volume: 111 mL . Echogenicity within normal limits. No mass or hydronephrosis visualized. Left Kidney: Renal measurements: 12.5 x 4.2 x 4.2 cm = volume: 115 mL. Two renal cysts are present measuring 1.2 cm. Echogenicity within normal limits. No mass or hydronephrosis visualized. Bladder: Appears normal for degree of bladder distention, but bladder not well distended. IMPRESSION: 1. Unremarkable kidneys except for 2 small LEFT renal cysts. No evidence of hydronephrosis. Electronically Signed   By: Margarette Canada M.D.   On: 02/25/2019 16:00   Dg Chest Port 1 View  Result Date: 02/28/2019 CLINICAL DATA:  Dyspnea. EXAM: PORTABLE CHEST 1 VIEW COMPARISON:  Radiograph of Feb 25, 2019. FINDINGS: Stable cardiomegaly. Atherosclerosis of thoracic aorta is noted. Left-sided pacemaker is unchanged in position. No pneumothorax or pleural effusion is noted. Left lung is clear. Mildly increased right lung opacities are noted concerning for either asymmetric edema or possibly pneumonia. Severe degenerative changes seen involving the right glenohumeral joint. IMPRESSION: Mildly increased right lung opacities are noted concerning for possible asymmetric edema or possibly pneumonia. Aortic Atherosclerosis (ICD10-I70.0). Electronically Signed   By: Marijo Conception M.D.   On: 02/28/2019 12:26   Dg Bone Survey Met  Result Date: 03/03/2019 CLINICAL DATA:  85 year old female with multiple myeloma, pain radiating through the right leg. EXAM: METASTATIC BONE SURVEY COMPARISON:  CT lumbar myelogram 06/19/2018 FINDINGS: Scattered round lucent lesions in the skull. Degenerative  changes in the cervical spine where bone mineralization appears within normal limits. Degenerative changes in the thoracic spine and lumbar spine. No spinal compression fracture identified. Spine bone mineralization appears within normal  limits. Previous lower lumbar interbody fusion. Calcified aortic atherosclerosis. Questionable heterogeneous lucency in the iliac wings. Femoral heads are normally located and hip joint spaces are normal for age. Pubic rami appear intact. No definite lytic lesion in the right lower extremity. Total right knee arthroplasty. There is an oval lytic lesion in the distal left femur measuring 11 millimeters (arrow). Prior left knee arthroplasty also. Elsewhere in the left lower extremity bone mineralization appears normal for age. Advanced degenerative changes at the right shoulder and wrist. There are lucent lesions in the distal right humerus. Cardiomegaly with left chest cardiac pacemaker. No discrete rib lesion identified. Small lucent lesions in the mid and distal humerus. Mild to moderate degenerative changes in the left upper extremity. No pathologic fracture identified. IMPRESSION: 1. Round and oval lucent/lytic lesions in the skull, bilateral humeri, distal left femur, and possibly also in the pelvis are compatible with multiple myeloma. No right lower extremity lytic lesion identified. No pathologic fracture. 2. Advanced degenerative changes in the spine with no vertebral myeloma evident by plain radiography. 3. Advanced degenerative changes in the right upper extremity. Bilateral knee arthroplasty. 4.  Aortic Atherosclerosis (ICD10-I70.0). Electronically Signed   By: Genevie Ann M.D.   On: 03/03/2019 19:59    Labs:  CBC: Recent Labs    03/03/19 0239 03/04/19 0310 03/05/19 0251 03/06/19 0440  WBC 6.8 6.7 6.6 7.1  HGB 7.6* 7.9* 8.2* 8.3*  HCT 23.3* 24.9* 26.2* 25.7*  PLT 157 177 181 194    COAGS: No results for input(s): INR, APTT in the last 8760  hours.  BMP: Recent Labs    03/03/19 0239 03/04/19 0310 03/05/19 0251 03/06/19 0440  NA 134* 134* 134* 132*  K 4.2 4.2 3.7 3.6  CL 103 102 103 100  CO2 19* 21* 22 20*  GLUCOSE 129* 123* 94 89  BUN 72* 83* 92* 87*  CALCIUM 9.2 9.0 8.5* 8.3*  CREATININE 4.16* 4.54* 4.77* 5.12*  GFRNONAA 9* 8* 8* 7*  GFRAA 11* 9* 9* 8*    LIVER FUNCTION TESTS: Recent Labs    02/25/19 1103 02/26/19 0353  03/03/19 0239 03/04/19 0310 03/05/19 0251 03/06/19 0440  BILITOT 0.8 0.3  --   --   --   --   --   AST 18 18  --   --   --   --   --   ALT 12 13  --   --   --   --   --   ALKPHOS 30* 26*  --   --   --   --   --   PROT 10.7* 9.4*  --   --   --   --   --   ALBUMIN 3.0* 2.5*   < > 2.5* 2.5* 2.1* 1.9*   < > = values in this interval not displayed.    TUMOR MARKERS: No results for input(s): AFPTM, CEA, CA199, CHROMGRNA in the last 8760 hours.  Assessment and Plan:  AKI- worsening renal function New MM No recovery of function Need for tunneled dialysis catheter for initiation of hemodialysis Risks and benefits discussed with the patient including, but not limited to bleeding, infection, vascular injury, pneumothorax which may require chest tube placement, air embolism or even death  All of the patient's questions were answered, patient is agreeable to proceed. Consent signed and in chart.   Thank you for this interesting consult.  I greatly enjoyed meeting AARION KITTRELL and look forward to participating in their care.  A copy of this report was sent to the requesting provider on this date.  Electronically Signed: Lavonia Drafts, PA-C 03/06/2019, 1:15 PM   I spent a total of 20 Minutes    in face to face in clinical consultation, greater than 50% of which was counseling/coordinating care for tunneled dialysis catheter placement

## 2019-03-06 NOTE — Progress Notes (Signed)
[name]   DOB:[birth year]   ZJ#:[id number]    ASSESSMENT & PLAN:  #1Elevated SPEP and light chainsconsistent with multiple myeloma  Her current work-up is incomplete; recent labs confirmed IgG kappa myeloma Skeletal survey is reviewed which show no evidence of impeding bone fractures Bone marrow biopsy was performed on 03/04/2019. Specimens sent for FISH, cytogenetics and additional studies.  Results confirmed her disease  She has completed 4 days of dexamethasone with improvement of her bone pain and hypercalcemia She was started on Velcade on 03/04/2019 following her bone marrow biopsy Due to her multiple comorbidities, she will receive Velcade q. 7 days, next dose due next week The patient tolerated her first dose well overall.    #2 Anemia Likely due to her underlying bone marrow disorder She was transfused on 03/02/2019 Monitor closely, recheck tomorrow No need transfusion today  #3 Malignant hypercalcemia,resolving She appears to be responding to steroids and malignant help her calcium is resolving Observe for now  #4 Acute renal failure She has acute on chronic renal failure due to undiagnosed multiple myeloma I am hopeful that high-dose corticosteroid therapy might improve her situation Appreciate nephrology follow-up She was given 1 dose of rasburicase on 03/03/2019 We discussed the possibility of hemodialysis and I will defer to nephrology for management  #5Chest pain with elevated troponin, resolved Echocardiogram show no evidence of cardiomyopathy Her chest pain could be triggered by hyperviscosity related to untreated multiple myeloma. Appreciate cardiology follow-up  #6 Severe bone pain, resolved Likely due to her underlying bone marrow disorder and hypercalcemia Skeletal survey showed no evidence of impeding bone fracture  #7 constipation Continue aggressive laxative therapy  #8 Vitamin D def I will start her on vitamin D replacement  All  questions were answered. The patient knows to call the clinic with any problems, questions or concerns. I will return to check on her next week. Please call on call physician for Oncology if question arise   Heath Lark, MD 03/06/2019 7:35 AM  Subjective:  She feels well.  She denies bone pain.  Denies fatigue She is still bothered by severe constipation.  She has minimum urine output  Objective:  Vitals:   03/05/19 2120 03/06/19 0532  BP:  (!) 119/50  Pulse: 78 69  Resp: 16 18  Temp:  97.6 F (36.4 C)  SpO2: 96% 97%     Intake/Output Summary (Last 24 hours) at 03/06/2019 0735 Last data filed at 03/06/2019 0600 Gross per 24 hour  Intake 220 ml  Output 125 ml  Net 95 ml    GENERAL:alert, no distress and comfortable SKIN: skin color, texture, turgor are normal, no rashes or significant lesions EYES: normal, Conjunctiva are pink and non-injected, sclera clear OROPHARYNX:no exudate, no erythema and lips, buccal mucosa, and tongue normal  NECK: supple, thyroid normal size, non-tender, without nodularity LYMPH:  no palpable lymphadenopathy in the cervical, axillary or inguinal LUNGS: clear to auscultation and percussion with normal breathing effort HEART: regular rate & rhythm and no murmurs and no lower extremity edema ABDOMEN:abdomen soft, non-tender and normal bowel sounds Musculoskeletal:no cyanosis of digits and no clubbing  NEURO: alert & oriented x 3 with fluent speech, no focal motor/sensory deficits   Labs:  Recent Labs    02/25/19 1103 02/26/19 0353  03/04/19 0310 03/05/19 0251 03/06/19 0440  NA 137 138   < > 134* 134* 132*  K 3.1* 3.5   < > 4.2 3.7 3.6  CL 101 104   < > 102 103  100  CO2 23 23   < > 21* 22 20*  GLUCOSE 127* 88   < > 123* 94 89  BUN 50* 45*   < > 83* 92* 87*  CREATININE 4.46* 4.21*   < > 4.54* 4.77* 5.12*  CALCIUM 10.8* 10.2   < > 9.0 8.5* 8.3*  GFRNONAA 8* 9*   < > 8* 8* 7*  GFRAA 10* 10*   < > 9* 9* 8*  PROT 10.7* 9.4*  --   --   --   --    ALBUMIN 3.0* 2.5*   < > 2.5* 2.1* 1.9*  AST 18 18  --   --   --   --   ALT 12 13  --   --   --   --   ALKPHOS 30* 26*  --   --   --   --   BILITOT 0.8 0.3  --   --   --   --    < > = values in this interval not displayed.    Studies:  Dg Chest 1 View  Result Date: 02/25/2019 CLINICAL DATA:  Acute kidney injury and cough. EXAM: CHEST  1 VIEW COMPARISON:  None. FINDINGS: Left chest wall pacer device is noted with leads in the right atrial appendage and right ventricle. Mild cardiac enlargement. Aortic atherosclerosis. No pleural effusion or edema. No airspace opacities. IMPRESSION: 1. Cardiac enlargement.  No acute findings. 2.  Aortic Atherosclerosis (ICD10-I70.0). Electronically Signed   By: Kerby Moors M.D.   On: 02/25/2019 13:51   Dg Chest 2 View  Result Date: 03/01/2019 CLINICAL DATA:  Hypertension.  Diabetes. EXAM: CHEST - 2 VIEW COMPARISON:  Feb 28, 2019 FINDINGS: Stable cardiomegaly. The hila and mediastinum are unremarkable. Mild atelectasis in the left base. Mild patchy opacity in the right lung, unchanged. No other acute changes. IMPRESSION: Stable patchy opacities in the right lung. No other interval changes. Electronically Signed   By: Dorise Bullion III M.D   On: 03/01/2019 17:04   US Renal  Result Date: 02/25/2019 CLINICAL DATA:  84 year old female with acute renal failure EXAM: RENAL / URINARY TRACT ULTRASOUND COMPLETE COMPARISON:  None. FINDINGS: Right Kidney: Renal measurements: 11.9 x 4.1 x 4.3 cm = volume: 111 mL . Echogenicity within normal limits. No mass or hydronephrosis visualized. Left Kidney: Renal measurements: 12.5 x 4.2 x 4.2 cm = volume: 115 mL. Two renal cysts are present measuring 1.2 cm. Echogenicity within normal limits. No mass or hydronephrosis visualized. Bladder: Appears normal for degree of bladder distention, but bladder not well distended. IMPRESSION: 1. Unremarkable kidneys except for 2 small LEFT renal cysts. No evidence of hydronephrosis.  Electronically Signed   By: Margarette Canada M.D.   On: 02/25/2019 16:00   Dg Chest Port 1 View  Result Date: 02/28/2019 CLINICAL DATA:  Dyspnea. EXAM: PORTABLE CHEST 1 VIEW COMPARISON:  Radiograph of Feb 25, 2019. FINDINGS: Stable cardiomegaly. Atherosclerosis of thoracic aorta is noted. Left-sided pacemaker is unchanged in position. No pneumothorax or pleural effusion is noted. Left lung is clear. Mildly increased right lung opacities are noted concerning for either asymmetric edema or possibly pneumonia. Severe degenerative changes seen involving the right glenohumeral joint. IMPRESSION: Mildly increased right lung opacities are noted concerning for possible asymmetric edema or possibly pneumonia. Aortic Atherosclerosis (ICD10-I70.0). Electronically Signed   By: Marijo Conception M.D.   On: 02/28/2019 12:26   Dg Bone Survey Met  Result Date: 03/03/2019 CLINICAL DATA:  85 year old female  with multiple myeloma, pain radiating through the right leg. EXAM: METASTATIC BONE SURVEY COMPARISON:  CT lumbar myelogram 06/19/2018 FINDINGS: Scattered round lucent lesions in the skull. Degenerative changes in the cervical spine where bone mineralization appears within normal limits. Degenerative changes in the thoracic spine and lumbar spine. No spinal compression fracture identified. Spine bone mineralization appears within normal limits. Previous lower lumbar interbody fusion. Calcified aortic atherosclerosis. Questionable heterogeneous lucency in the iliac wings. Femoral heads are normally located and hip joint spaces are normal for age. Pubic rami appear intact. No definite lytic lesion in the right lower extremity. Total right knee arthroplasty. There is an oval lytic lesion in the distal left femur measuring 11 millimeters (arrow). Prior left knee arthroplasty also. Elsewhere in the left lower extremity bone mineralization appears normal for age. Advanced degenerative changes at the right shoulder and wrist. There are  lucent lesions in the distal right humerus. Cardiomegaly with left chest cardiac pacemaker. No discrete rib lesion identified. Small lucent lesions in the mid and distal humerus. Mild to moderate degenerative changes in the left upper extremity. No pathologic fracture identified. IMPRESSION: 1. Round and oval lucent/lytic lesions in the skull, bilateral humeri, distal left femur, and possibly also in the pelvis are compatible with multiple myeloma. No right lower extremity lytic lesion identified. No pathologic fracture. 2. Advanced degenerative changes in the spine with no vertebral myeloma evident by plain radiography. 3. Advanced degenerative changes in the right upper extremity. Bilateral knee arthroplasty. 4.  Aortic Atherosclerosis (ICD10-I70.0). Electronically Signed   By: Genevie Ann M.D.   On: 03/03/2019 19:59

## 2019-03-06 NOTE — Progress Notes (Signed)
Nephrology Progress Note   Subjective:  No sob, +fatiuge and nausea. Creat up >5 today, UOP down 125 yest  Objective Vital signs in last 24 hours: Vitals:   03/05/19 1843 03/05/19 2044 03/05/19 2120 03/06/19 0532  BP: (!) 126/50 (!) 114/54  (!) 119/50  Pulse: 70 70 78 69  Resp: 16 16 16 18   Temp: 97.8 F (36.6 C) 97.7 F (36.5 C)  97.6 F (36.4 C)  TempSrc: Oral Oral  Oral  SpO2: 98% 97% 96% 97%  Weight:      Height:       Weight change:   Intake/Output Summary (Last 24 hours) at 03/06/2019 4680 Last data filed at 03/06/2019 0600 Gross per 24 hour  Intake 220 ml  Output 125 ml  Net 95 ml  Physical Exam: General: elderly female in bed in NAD at rest  Heart: S1S2; no rub  Lungs: clear to auscultation bilaterally; normal work of breathing at rest  Abdomen: soft/NT/ND Extremities: no edema lower extremities  Neuro - awake and alert, conversant   Assessment/ Plan: Pt is a 85 y.o. yo female with DM, HTN, unknown baseline renal status (crt 1.0 to 1.3 in 2017 ) who was admitted on 02/25/2019 with AKI (vs chronic)  in the setting of multiple recent renal insults (NSAIDS/ARB) and suspicion for multiple myeloma   Assessment/Plan: 1. AKI - presumed secondary to multiple myeloma/ nausea-vomiting/ NSAIDS/ ARB. UA not suggestive of GN.  Renal ultrasound unremarkable.  Protein to crt ratio 1.8, +30 on the dip.  SPEP positive in the setting of anemia, hypercalcemia and hyperproteinemia and presumed myeloma. Note S/p decadron  - patient has early uremia and rising creat >> will go ahead and consult IR for [facility] and start dialysis tomorrow, have d/w pt and answered questions   - may or may not see improvement w/ renal fxn over time with treatment of myeloma - avoid NSAID's and ARB's/Ace inhibitors  2.  SPEP positive- likely diagnosis of multiple myeloma.  Skeletal survey with multiple lytic lesions in skull and extremities.  SP BM biopsy 5/19. Started on Velcade 5/19  per ONC and is s/p  decadron.  s/p rasburicase x 1.   3. Anemia- secondary to multiple myeloma and likely component of renal failure, as well. Worsening with IVF with iron deficiency.  Replete iron stores and s/p one-time dose of darbe.  s/p PRBC's x 1 on 5/17  4. Hypercalcemia - setting of multiple myeloma.  Improving     5. HTN/volume-  Improved control. Echo looked reasonable- cards not recommending anything specific.  Avoid hypotension.  6. Proteinuria  - Secondary to presumed multiple myeloma - Off of RAAS blockade with AKI   7. Metabolic acidosis - secondary in part to renal failure.  Stable   Kelly Splinter, MD 03/06/2019, 9:37 AM    Labs: Basic Metabolic Panel: Recent Labs  Lab 03/04/19 0310 03/05/19 0251 03/06/19 0440  NA 134* 134* 132*  K 4.2 3.7 3.6  CL 102 103 100  CO2 21* 22 20*  GLUCOSE 123* 94 89  BUN 83* 92* 87*  CREATININE 4.54* 4.77* 5.12*  CALCIUM 9.0 8.5* 8.3*  PHOS 3.2 3.3 RESULTS UNAVAILABLE DUE TO INTERFERING SUBSTANCE   Liver Function Tests: Recent Labs  Lab 03/04/19 0310 03/05/19 0251 03/06/19 0440  ALBUMIN 2.5* 2.1* 1.9*   CBC: Recent Labs  Lab 03/02/19 0300  03/03/19 0239 03/04/19 0310 03/05/19 0251 03/06/19 0440  WBC 7.6  --  6.8 6.7 6.6 7.1  NEUTROABS 6.1  --   --  5.0  --   --   HGB 6.8*   < > 7.6* 7.9* 8.2* 8.3*  HCT 21.2*   < > 23.3* 24.9* 26.2* 25.7*  MCV 91.4  --  88.6 89.6 90.7 86.5  PLT 162  --  157 177 181 194   < > = values in this interval not displayed.   Cardiac Enzymes: Recent Labs  Lab 02/28/19 1000 02/28/19 1504 02/28/19 2118  TROPONINI 0.12* 0.15* 0.20*   CBG: Recent Labs  Lab 03/05/19 1228 03/05/19 1605 03/05/19 1838 03/05/19 2041 03/06/19 0704  GLUCAP 83 78 89 75 71    Iron Studies:  No results for input(s): IRON, TIBC, TRANSFERRIN, FERRITIN in the last 72 hours. Studies/Results: No results found. Medications: Infusions: . sodium chloride Stopped (03/03/19 0440)  . ferumoxytol Stopped (02/28/19 1534)     Scheduled Medications: . allopurinol  100 mg Oral Daily  . amLODipine  10 mg Oral Daily  . Chlorhexidine Gluconate Cloth  6 each Topical Daily  . cholecalciferol  2,000 Units Oral Daily  . dorzolamide  1 drop Left Eye QPM  . fluorometholone  1 drop Right Eye TID  . heparin  5,000 Units Subcutaneous Q8H  . insulin aspart  0-9 Units Subcutaneous TID WC  . isosorbide-hydrALAZINE  1 tablet Oral TID  . latanoprost  1 drop Left Eye QHS  . mouth rinse  15 mL Mouth Rinse BID  . pantoprazole  40 mg Oral Daily  . polyethylene glycol  17 g Oral Daily  . pregabalin  50 mg Oral Daily  . senna-docusate  2 tablet Oral BID    have reviewed scheduled and prn medications.

## 2019-03-07 ENCOUNTER — Other Ambulatory Visit (HOSPITAL_COMMUNITY): Payer: Medicare HMO

## 2019-03-07 ENCOUNTER — Inpatient Hospital Stay (HOSPITAL_COMMUNITY): Payer: Medicare HMO

## 2019-03-07 ENCOUNTER — Encounter (HOSPITAL_COMMUNITY): Payer: Self-pay | Admitting: Diagnostic Radiology

## 2019-03-07 ENCOUNTER — Encounter (HOSPITAL_COMMUNITY): Payer: Medicare HMO

## 2019-03-07 DIAGNOSIS — N185 Chronic kidney disease, stage 5: Secondary | ICD-10-CM

## 2019-03-07 HISTORY — PX: IR FLUORO GUIDE CV LINE RIGHT: IMG2283

## 2019-03-07 HISTORY — PX: IR US GUIDE VASC ACCESS RIGHT: IMG2390

## 2019-03-07 LAB — RENAL FUNCTION PANEL
Albumin: 1.8 g/dL — ABNORMAL LOW (ref 3.5–5.0)
Anion gap: 11 (ref 5–15)
BUN: 90 mg/dL — ABNORMAL HIGH (ref 8–23)
CO2: 20 mmol/L — ABNORMAL LOW (ref 22–32)
Calcium: 8.2 mg/dL — ABNORMAL LOW (ref 8.9–10.3)
Chloride: 101 mmol/L (ref 98–111)
Creatinine, Ser: 5.37 mg/dL — ABNORMAL HIGH (ref 0.44–1.00)
GFR calc Af Amer: 8 mL/min — ABNORMAL LOW (ref >60)
GFR calc non Af Amer: 7 mL/min — ABNORMAL LOW (ref >60)
Glucose, Bld: 79 mg/dL (ref 70–99)
Phosphorus: >30 mg/dL — ABNORMAL HIGH (ref 2.5–4.6)
Potassium: 3.9 mmol/L (ref 3.5–5.1)
Sodium: 132 mmol/L — ABNORMAL LOW (ref 135–145)

## 2019-03-07 LAB — GLUCOSE, CAPILLARY
Glucose-Capillary: 73 mg/dL (ref 70–99)
Glucose-Capillary: 113 mg/dL — ABNORMAL HIGH (ref 70–99)
Glucose-Capillary: 68 mg/dL — ABNORMAL LOW (ref 70–99)
Glucose-Capillary: 85 mg/dL (ref 70–99)
Glucose-Capillary: 91 mg/dL (ref 70–99)
Glucose-Capillary: 100 mg/dL — ABNORMAL HIGH (ref 70–99)

## 2019-03-07 LAB — CBC
HCT: 23.7 % — ABNORMAL LOW (ref 36.0–46.0)
Hemoglobin: 7.8 g/dL — ABNORMAL LOW (ref 12.0–15.0)
MCH: 28.2 pg (ref 26.0–34.0)
MCHC: 32.9 g/dL (ref 30.0–36.0)
MCV: 85.6 fL (ref 80.0–100.0)
Platelets: 168 10*3/uL (ref 150–400)
RBC: 2.77 MIL/uL — ABNORMAL LOW (ref 3.87–5.11)
RDW: 16.2 % — ABNORMAL HIGH (ref 11.5–15.5)
WBC: 6.7 10*3/uL (ref 4.0–10.5)
nRBC: 0.3 % — ABNORMAL HIGH (ref 0.0–0.2)

## 2019-03-07 LAB — HEPATITIS B SURFACE ANTIGEN: Hepatitis B Surface Ag: NEGATIVE

## 2019-03-07 LAB — IRON AND TIBC
Iron: 60 ug/dL (ref 28–170)
Saturation Ratios: 35 % — ABNORMAL HIGH (ref 10.4–31.8)
TIBC: 174 ug/dL — ABNORMAL LOW (ref 250–450)
UIBC: 114 ug/dL

## 2019-03-07 LAB — HEPATITIS B CORE ANTIBODY, IGM: Hep B C IgM: NEGATIVE

## 2019-03-07 LAB — HEPATITIS B SURFACE ANTIBODY,QUALITATIVE: Hep B S Ab: NONREACTIVE

## 2019-03-07 MED ORDER — LIDOCAINE HCL 1 % IJ SOLN
INTRAMUSCULAR | Status: AC | PRN
  Administered 2019-03-07: 20 mL

## 2019-03-07 MED ORDER — VANCOMYCIN HCL IN DEXTROSE 1-5 GM/200ML-% IV SOLN
INTRAVENOUS | Status: AC
  Administered 2019-03-07: 1000 mg via INTRAVENOUS
  Filled 2019-03-07: qty 200

## 2019-03-07 MED ORDER — HEPARIN SODIUM (PORCINE) 1000 UNIT/ML IJ SOLN
INTRAMUSCULAR | Status: AC
  Administered 2019-03-07: 3.2 mL
  Filled 2019-03-07: qty 1

## 2019-03-07 MED ORDER — MIDAZOLAM HCL 2 MG/2ML IJ SOLN
INTRAMUSCULAR | Status: AC | PRN
  Administered 2019-03-07: 1 mg via INTRAVENOUS

## 2019-03-07 MED ORDER — LIDOCAINE HCL 1 % IJ SOLN
INTRAMUSCULAR | Status: AC
  Filled 2019-03-07: qty 20

## 2019-03-07 MED ORDER — MIDAZOLAM HCL 2 MG/2ML IJ SOLN
INTRAMUSCULAR | Status: AC
  Filled 2019-03-07: qty 2

## 2019-03-07 MED ORDER — FENTANYL CITRATE (PF) 100 MCG/2ML IJ SOLN
INTRAMUSCULAR | Status: AC
  Filled 2019-03-07: qty 2

## 2019-03-07 MED ORDER — HEPARIN SODIUM (PORCINE) 1000 UNIT/ML IJ SOLN
INTRAMUSCULAR | Status: AC
  Administered 2019-03-07: 3200 [IU] via INTRAVENOUS
  Filled 2019-03-07: qty 4

## 2019-03-07 MED ORDER — FENTANYL CITRATE (PF) 100 MCG/2ML IJ SOLN
INTRAMUSCULAR | Status: AC | PRN
  Administered 2019-03-07: 50 ug via INTRAVENOUS

## 2019-03-07 MED ORDER — HEPARIN SODIUM (PORCINE) 1000 UNIT/ML IJ SOLN
1000.0000 [IU] | INTRAMUSCULAR | Status: DC | PRN
  Administered 2019-03-07: 16:00:00 3200 [IU] via INTRAVENOUS
  Administered 2019-03-11: 7000 [IU] via INTRAVENOUS
  Filled 2019-03-07: qty 1

## 2019-03-07 MED ORDER — GELATIN ABSORBABLE 12-7 MM EX MISC
CUTANEOUS | Status: AC
  Administered 2019-03-07: 10:00:00
  Filled 2019-03-07: qty 1

## 2019-03-07 MED ORDER — HEPARIN SODIUM (PORCINE) 1000 UNIT/ML IJ SOLN
INTRAMUSCULAR | Status: AC
  Filled 2019-03-07: qty 3

## 2019-03-07 MED ORDER — HEPARIN SODIUM (PORCINE) 1000 UNIT/ML DIALYSIS
2500.0000 [IU] | INTRAMUSCULAR | Status: DC | PRN
  Administered 2019-03-07: 2500 [IU] via INTRAVENOUS_CENTRAL

## 2019-03-07 MED ORDER — CALCIUM CARBONATE ANTACID 500 MG PO CHEW
1.0000 | CHEWABLE_TABLET | Freq: Two times a day (BID) | ORAL | Status: DC | PRN
  Administered 2019-03-07: 200 mg via ORAL
  Filled 2019-03-07: qty 1

## 2019-03-07 MED ORDER — DEXTROSE 50 % IV SOLN
12.5000 g | INTRAVENOUS | Status: AC
  Administered 2019-03-07: 12.5 g via INTRAVENOUS
  Filled 2019-03-07: qty 50

## 2019-03-07 NOTE — Progress Notes (Signed)
Patient NPO. CBG 68 at 0648. Placed in hypoglycemic order set.  Gave dextrose 50% solution 12.5g IV. Rechecked after 15 mins CBG now 113.

## 2019-03-07 NOTE — Consult Note (Signed)
REASON FOR CONSULT:    To evaluate for hemodialysis access.  The consult is requested by Dr. Justin Mend.  ASSESSMENT & PLAN:   END-STAGE RENAL DISEASE: We have been consulted for hemodialysis access.  The patient had a tunneled dialysis catheter placed today by radiology.  She has a pacemaker on the left side so I would favor a fistula or graft in the right arm.  She has 2 IVs in the right arm and I have asked that these be moved to her left arm.  She is scheduled for vein mapping.  In addition, she complains of some neuropathy in her right hand.  She does have a palpable radial pulse bilaterally but giving the symptoms I have ordered a formal upper extremity arterial duplex for preop evaluation.  There would be some risk of increasing her symptoms in the right hand if access is placed.  Given the holiday weekend I have her tentatively scheduled for a right arm AV fistula or AV graft on Tuesday next week.  I have explained the indications for placement of an AV fistula or AV graft. I've explained that if at all possible we will place an AV fistula.  I have reviewed the risks of placement of an AV fistula including but not limited to: failure of the fistula to mature, need for subsequent interventions, and thrombosis. In addition I have reviewed the potential complications of placement of an AV graft. These risks include, but are not limited to, graft thrombosis, graft infection, wound healing problems, bleeding, arm swelling, and steal syndrome. All the patient's questions were answered and they are agreeable to proceed with surgery.  I will follow-up on her vein map and upper extremity arterial duplex study this weekend.  [name], MD, FACS Beeper [phone] Office: [phone]   HPI:   [name] is a pleasant 85 y.o. female, who was admitted on 02/25/2019.  She was sent from her primary care doctor's office with abnormal labs.  She has a complicated history with a history of type 2  diabetes, multiple myeloma, sick sinus syndrome, obstructive sleep apnea, and hypertension.  She is right-handed.  She has a pacemaker in the left subclavian vein.  Past Medical History:  Diagnosis Date  . Benign paroxysmal positional vertigo 04/06/2010  . Benign positional vertigo   . 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 CAROTID BRUIT 02/24/2008  . DIABETES MELLITUS, TYPE II 02/24/2008  . Glaucoma   . HYPERTENSION 02/24/2008  . Obstructive sleep apnea   . Sick sinus syndrome (HCC)     Family History  Problem Relation Age of Onset  . Congestive Heart Failure Mother   . Tuberculosis Mother   . Multiple myeloma Mother   . Bone cancer Father   . Arthritis Father   . Breast cancer Sister   . Colon cancer Sister   . Hypertension Sister   . Dementia Sister   . Alcohol abuse Son     SOCIAL HISTORY: Social History   Socioeconomic History  . Marital status: Married    Spouse name: Not on file  . Number of children: 5  . Years of education: Not on file  . Highest education level: Not on file  Occupational History  . Occupation: retired  Scientific laboratory technician  . Financial resource strain: Not on file  . Food insecurity:    Worry: Not on file    Inability: Not on file  . Transportation needs:    Medical: Not on file  Non-medical: Not on file  Tobacco Use  . Smoking status: Never Smoker  . Smokeless tobacco: Never Used  Substance and Sexual Activity  . Alcohol use: No  . Drug use: No  . Sexual activity: Not on file  Lifestyle  . Physical activity:    Days per week: Not on file    Minutes per session: Not on file  . Stress: Not on file  Relationships  . Social connections:    Talks on phone: Not on file    Gets together: Not on file    Attends religious service: Not on file    Active member of club or organization: Not on file    Attends meetings of clubs or organizations: Not on file    Relationship status: Not on file  . Intimate partner violence:    Fear of current  or ex partner: Not on file    Emotionally abused: Not on file    Physically abused: Not on file    Forced sexual activity: Not on file  Other Topics Concern  . Not on file  Social History Narrative   Retired Marine scientist    Allergies  Allergen Reactions  . Aspirin Other (See Comments)    REACTION: STOMACH ISSUES WITH DOSE HIGHER THAN 81 MG   . Penicillins Rash    Patient took injection and tablets, had a reaction. She has taken amoxicillin with no reaction Has patient had a PCN reaction causing immediate rash, facial/tongue/throat swelling, SOB or lightheadedness with hypotension: Yes Has patient had a PCN reaction causing severe rash involving mucus membranes or skin necrosis: Yes Has patient had a PCN reaction that required hospitalization No Has patient had a PCN reaction occurring within the last 10 years: No If all of the above answers are "NO", then m  . Brimonidine Itching  . Diltiazem Hcl Rash  . Hydrocodone-Acetaminophen Nausea And Vomiting  . Morphine Nausea And Vomiting  . Neurontin [Gabapentin] Nausea And Vomiting    Current Facility-Administered Medications  Medication Dose Route Frequency Provider Last Rate Last Dose  . 0.9 %  sodium chloride infusion   Intravenous Continuous Oswald Hillock, MD   Stopped at 03/03/19 0440  . acetaminophen (TYLENOL) tablet 650 mg  650 mg Oral Q6H PRN Oswald Hillock, MD   650 mg at 03/04/19 0659  . allopurinol (ZYLOPRIM) tablet 100 mg  100 mg Oral Daily Oswald Hillock, MD   100 mg at 03/07/19 1105  . amLODipine (NORVASC) tablet 10 mg  10 mg Oral Daily Oswald Hillock, MD   Stopped at 03/07/19 1112  . calcium carbonate (TUMS - dosed in mg elemental calcium) chewable tablet 200 mg of elemental calcium  1 tablet Oral BID PRN Nita Sells, MD   200 mg of elemental calcium at 03/07/19 0417  . Chlorhexidine Gluconate Cloth 2 % PADS 6 each  6 each Topical Daily Oswald Hillock, MD   6 each at 03/06/19 1011  . cholecalciferol (VITAMIN D3) tablet  2,000 Units  2,000 Units Oral Daily Gorsuch, Ni, MD   2,000 Units at 03/07/19 1112  . dorzolamide (TRUSOPT) 2 % ophthalmic solution 1 drop  1 drop Left Eye QPM Oswald Hillock, MD   1 drop at 03/06/19 2322  . fentaNYL (SUBLIMAZE) 100 MCG/2ML injection           . fentaNYL (SUBLIMAZE) injection 50 mcg  50 mcg Intravenous Q2H PRN Oswald Hillock, MD   50 mcg at 02/28/19 1057  .  ferumoxytol (FERAHEME) 510 mg in sodium chloride 0.9 % 100 mL IVPB  510 mg Intravenous Weekly Corliss Parish, MD   Stopped at 02/28/19 1534  . fluorometholone (FML) 0.1 % ophthalmic suspension 1 drop  1 drop Right Eye TID Oswald Hillock, MD   1 drop at 03/07/19 1113  . hydrALAZINE (APRESOLINE) injection 10 mg  10 mg Intravenous Q6H PRN Oswald Hillock, MD      . insulin aspart (novoLOG) injection 0-9 Units  0-9 Units Subcutaneous TID WC Oswald Hillock, MD   1 Units at 03/04/19 1745  . isosorbide-hydrALAZINE (BIDIL) 20-37.5 MG per tablet 1 tablet  1 tablet Oral TID Josue Hector, MD   Stopped at 03/07/19 1120  . latanoprost (XALATAN) 0.005 % ophthalmic solution 1 drop  1 drop Left Eye QHS Oswald Hillock, MD   1 drop at 03/06/19 2323  . lidocaine (XYLOCAINE) 1 % (with pres) injection           . MEDLINE mouth rinse  15 mL Mouth Rinse BID Oswald Hillock, MD   15 mL at 03/06/19 2328  . midazolam (VERSED) 2 MG/2ML injection           . ondansetron (ZOFRAN) injection 4 mg  4 mg Intravenous Q6H PRN Oswald Hillock, MD   4 mg at 03/04/19 1554  . pantoprazole (PROTONIX) EC tablet 40 mg  40 mg Oral Daily Leodis Sias T, RPH   40 mg at 03/07/19 1111  . polyethylene glycol (MIRALAX / GLYCOLAX) packet 17 g  17 g Oral Daily Alvy Bimler, Ni, MD   17 g at 03/07/19 1119  . pregabalin (LYRICA) capsule 50 mg  50 mg Oral Daily Oswald Hillock, MD   50 mg at 03/06/19 2322  . senna-docusate (Senokot-S) tablet 2 tablet  2 tablet Oral BID Heath Lark, MD   2 tablet at 03/07/19 1111    REVIEW OF SYSTEMS:  [X]  denotes positive finding, [ ]  denotes negative  finding Cardiac  Comments:  Chest pain or chest pressure:    Shortness of breath upon exertion: x   Short of breath when lying flat:    Irregular heart rhythm:        Vascular    Pain in calf, thigh, or hip brought on by ambulation:    Pain in feet at night that wakes you up from your sleep:     Blood clot in your veins:    Leg swelling:  x       Pulmonary    Oxygen at home:    Productive cough:     Wheezing:         Neurologic    Sudden weakness in arms or legs:     Sudden numbness in arms or legs:     Sudden onset of difficulty speaking or slurred speech:    Temporary loss of vision in one eye:     Problems with dizziness:         Gastrointestinal    Blood in stool:     Vomited blood:         Genitourinary    Burning when urinating:     Blood in urine:        Psychiatric    Major depression:         Hematologic    Bleeding problems:    Problems with blood clotting too easily:        Skin    Rashes or ulcers:  Constitutional    Fever or chills:     PHYSICAL EXAM:   Vitals:   03/07/19 1010 03/07/19 1015 03/07/19 1020 03/07/19 1103  BP: (!) 116/44 (!) 118/45 (!) 124/48 (!) 129/48  Pulse: 70 71 76 72  Resp: 15 15 13 18   Temp:    97.7 F (36.5 C)  TempSrc:    Oral  SpO2: 98% 98% 96% 94%  Weight:      Height:        GENERAL: The patient is a well-nourished female, in no acute distress. The vital signs are documented above. CARDIAC: There is a regular rate and rhythm.  VASCULAR: She has palpable radial pulses bilaterally. She has 2 IVs in the right forearm. PULMONARY: There is good air exchange bilaterally without wheezing or rales. ABDOMEN: Soft and non-tender with normal pitched bowel sounds.  MUSCULOSKELETAL: There are no major deformities or cyanosis. NEUROLOGIC: No focal weakness or paresthesias are detected. SKIN: There are no ulcers or rashes noted. PSYCHIATRIC: The patient has a normal affect.  DATA:    LABS: Her GFR today is 8.   Creatinine is 5.37.  Hemoglobin is 7.8.  Platelets 168,000.  White blood cell count 6.7 thousand.  Chest x-ray shows her pacemaker is in the left subclavian vein.  VEIN MAP: Pending  UPPER EXTREMITY ARTERIAL DUPLEX: Pending

## 2019-03-07 NOTE — Procedures (Signed)
Interventional Radiology Procedure:   Indications: AKI  Procedure: Tunneled HD catheter placement  Findings: Right jugular HD catheter, tip at SVC/RA junction  Complications: None     EBL: Minimal, less than 10 ml  Plan: Catheter is ready to use.     [name] R. Anselm Pancoast, MD  Pager: [phone]

## 2019-03-07 NOTE — Progress Notes (Signed)
[facility] PROGRESS NOTE  SIMONA ROCQUE OFH:[id number] DOB: [birth year] DOA: 02/25/2019 PCP: Lucianne Lei, MD   Narrative: 85 year old female known type II diabetic OSA on CPAP sick sinus syndrome status post PPM HTN presented to emergency room 02/25/2019 abnormal labs Noted decreased U OP + constipation-significant history for Motrin Tylenol tramadol-also noted to be on HCTZ at home BUN/creatinine 50/4.4 on admit UA straw-colored 30 of protein hypertensive 150s over 46s Nephrology input = multifactorial prerenal insults-nonoliguric-work-up started and eventually revealed elevated M spike light chains anemia hypercalcemia prompting oncology input Oncological work-up performed-started on dexamethasone short-term corticosteroids rasburicase etc. Cardiology consulted 5/16 for pulmonary edema secondary to volume overload-placed on IV nitroglycerin  Patient was eventually transferred to [facility] 5/21 for possible initiation of HD   A & Plan AKI Hyponatremia/Hypokalemia Multifactorial-myeloma + prior insults over baseline of diabetic neuropathy dialysis planning started-  appreciate input from Dr. Doren Custard of vascular surgery who plans on doing access placement on Tuesday 26th Hypokalemia has resolved Multiple myeloma-IgG type Malignant Hypercalcemia 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 M spike on admit-Skel survery shows lucencies--Received Dexamethasone/ Bortezomib  Calcium is now lower--rpl  with am labs transfused x 1 on 5/17, IV iron 5/15 Iron stores appear to be adequate however TIBC is decreased saturation ratios are paradoxically increased-I will defer further planning to Dr. Simeon Craft such HTN Continuing BiDil 1 tab 3 times daily PRN hydralazine 10 mg IV every 6 as needed for pressure over 160 Suspect will need less antihypertensive medications over the course of dialysis-she is slightly hypotensive on the HD unit today so I am discontinuing her amlodipine 10 mg Diabetes + neuropathy  nephropathy Will not cover her blood sugar at this time given hypoglycemia in a setting of poor appetite Will review trends in the morning Gout  stable at this time  DVT [place]eneox  Code Status: FULL  Communication: I called the patient's daughter Remo Lipps from the bedside and explained to her course of events up-to-date-she was given ample opportunity to ask questions which were answered appropriately-I expect she will be here at least through Tuesday 26 for dialysis access and then clipping-disposition Plan: Inpatient   Sylvi Rybolt, MD  [facility] Via amion app OR [email or web address] 7PM-7AM contact night coverage as above 03/07/2019, 3:52 PM  LOS: 9 days   Consultants:  Surgery  Nephrology  Procedures:   Bone marrow biopsy 5/19  Renal US 5/12  Bone survey 5/18  Antimicrobials:   None   Interval history/Subjective:  Continues to be sleepy Feels tired and weak No dark stool no tarry stool No vomiting No chest pain  Objective:  Vitals:  Vitals:   03/07/19 1530 03/07/19 1545  BP: (!) 128/48 (!) 89/49  Pulse: 74 70  Resp:    Temp:    SpO2:      Exam:  Pleasant oriented arcus senilis present poor vision right eye poor dentition Mallampati 2 no JVD S1-S2 no murmur No submandibular lymphadenopathy Abdomen is soft no rebound no organomegaly noted to my exam Patient has right upper extremity access No lower extremity edema Bilateral knee scars Range of motion intact grossly Euthymic and congruent  I have personally reviewed the following:  DATA   Labs:  Sodium 132  Iron 60  TIBC 174  Saturation was 35  Hemoglobin 7.8 MCV 85 platelet 168  Scheduled Meds: . allopurinol  100 mg Oral Daily  . amLODipine  10 mg Oral Daily  . Chlorhexidine Gluconate Cloth  6 each Topical Daily  . cholecalciferol  2,000 Units  Oral Daily  . dorzolamide  1 drop Left Eye QPM  . fentaNYL      . fluorometholone  1 drop Right Eye TID  . insulin aspart  0-9 Units  Subcutaneous TID WC  . isosorbide-hydrALAZINE  1 tablet Oral TID  . latanoprost  1 drop Left Eye QHS  . lidocaine      . mouth rinse  15 mL Mouth Rinse BID  . midazolam      . pantoprazole  40 mg Oral Daily  . polyethylene glycol  17 g Oral Daily  . pregabalin  50 mg Oral Daily  . senna-docusate  2 tablet Oral BID   Continuous Infusions: . sodium chloride Stopped (03/03/19 0440)  . ferumoxytol Stopped (02/28/19 1534)    Active Problems:   AKI (acute kidney injury) ([place])   Hypokalemia   LOS: 9 days

## 2019-03-07 NOTE — Progress Notes (Signed)
Physical Therapy Treatment Patient Details Name: NANNIE STARZYK MRN: [id number] DOB: [birth year] Today's Date: 03/07/2019    History of Present Illness 85 yo female admitted with AKI, new diagnosis of multiple myeloma, pulmonary edema. Hx of bil knee surgeries, OA, DM gout neuropathy, SSS, pacemaker anemia    PT Comments    Patient reports more fatigue today than prior session, unable to progress ambulation distance. Intermittent contact guard during gait with RW today. Cont to rec HHPT with family support once medically ready for d/c.  Follow Up Recommendations  Home health PT;Supervision for mobility/OOB(home health aide)     Equipment Recommendations  (TBD)    Recommendations for Other Services       Precautions / Restrictions Precautions Precautions: Fall Restrictions Weight Bearing Restrictions: No    Mobility  Bed Mobility Overal bed mobility: Needs Assistance       Supine to sit: Supervision        Transfers Overall transfer level: Needs assistance   Transfers: Sit to/from Stand Sit to Stand: Supervision         General transfer comment: close supervision to stand  Ambulation/Gait Ambulation/Gait assistance: Min guard Gait Distance (Feet): 100 Feet Assistive device: Rolling walker (2 wheeled) Gait Pattern/deviations: Step-to pattern Gait velocity: decreased   General Gait Details: slow gait, pt reports feeling more fatigue today, unable to progress. min guard with RW   Stairs             Wheelchair Mobility    Modified Rankin (Stroke Patients Only)       Balance Overall balance assessment: Needs assistance   Sitting balance-Leahy Scale: Fair     Standing balance support: Single extremity supported Standing balance-Leahy Scale: Poor                              Cognition Arousal/Alertness: Awake/alert Behavior During Therapy: WFL for tasks assessed/performed Overall Cognitive Status: Within Functional Limits  for tasks assessed                                        Exercises      General Comments        Pertinent Vitals/Pain Pain Assessment: Faces Faces Pain Scale: Hurts even more Pain Location: back, bil knees Pain Descriptors / Indicators: Aching;Sore Pain Intervention(s): Limited activity within patient's tolerance;Monitored during session    Home Living                      Prior Function            PT Goals (current goals can now be found in the care plan section) Acute Rehab PT Goals Patient Stated Goal: get stronger PT Goal Formulation: With patient Time For Goal Achievement: 03/17/19 Potential to Achieve Goals: Fair    Frequency    Min 3X/week      PT Plan Current plan remains appropriate    Co-evaluation              AM-PAC PT "6 Clicks" Mobility   Outcome Measure  Help needed turning from your back to your side while in a flat bed without using bedrails?: A Little Help needed moving from lying on your back to sitting on the side of a flat bed without using bedrails?: A Little Help needed moving to and from a bed to  a chair (including a wheelchair)?: A Little Help needed standing up from a chair using your arms (e.g., wheelchair or bedside chair)?: A Little Help needed to walk in hospital room?: A Little Help needed climbing 3-5 steps with a railing? : A Little 6 Click Score: 18    End of Session Equipment Utilized During Treatment: Gait belt Activity Tolerance: Patient tolerated treatment well Patient left: in chair;with call bell/phone within reach Nurse Communication: Mobility status PT Visit Diagnosis: Muscle weakness (generalized) (M62.81);Unsteadiness on feet (R26.81)     Time: 0312-8118 PT Time Calculation (min) (ACUTE ONLY): 20 min  Charges:  $Gait Training: 8-22 mins                     Reinaldo Berber, PT, DPT Acute Rehabilitation Services Pager: [phone] Office: [place] 03/07/2019, 1:29 PM

## 2019-03-07 NOTE — Progress Notes (Signed)
Inpatient Diabetes Program Recommendations  AACE/ADA: New Consensus Statement on Inpatient Glycemic Control (2015)  Target Ranges:  Prepandial:   less than 140 mg/dL      Peak postprandial:   less than 180 mg/dL (1-2 hours)      Critically ill patients:  140 - 180 mg/dL   Lab Results  Component Value Date   GLUCAP 113 (H) 03/07/2019   HGBA1C 5.5 02/26/2019    Review of Glycemic Control Results for [name], [name] (MRN [id number]) as of 03/07/2019 09:54  Ref. Range 03/06/2019 16:52 03/06/2019 22:14 03/07/2019 05:04 03/07/2019 06:48 03/07/2019 07:17  Glucose-Capillary Latest Ref Range: 70 - 99 mg/dL 81 77 73 68 (L) 113 (H)   Inpatient Diabetes Program Recommendations:   Patient has not received Novolog correction since Tuesday. Please consider if patient remains on Novolog correction scale: -Custom Novolog correction scale 0-5 units       151-200  1 unit      201-250  2 units      251-300  3 units      301-350  4 units      351-400  5 units  Thank you, Bethena Roys E. Iesha Summerhill, RN, MSN, CDE  Diabetes Coordinator Inpatient Glycemic Control Team Team Pager [phone] (8am-5pm) 03/07/2019 9:55 AM

## 2019-03-07 NOTE — Progress Notes (Signed)
[name]   Subjective:   Is an 85 year old lady with a history of diabetes obstructive sleep apnea history of sick sinus syndrome with placement of permanent pacemaker, hypertension.  She presented with acute kidney injury and was evaluated and found to have a positive M spike, skeletal survey with no impending bone fractures and a bone marrow biopsy performed 03/04/2019 cytogenetics pending.  She was treated with 4 days dexamethasone started on Velcade 03/04/2019 she received Velcade every 7 days.  She was also found to be hypercalcemic on admission that appears to be resolving.  Patient appears to be symptomatically uremic and was transferred from [facility] long hospital to [facility] for dialysis treatment.  She has been consulted with interventional radiology for temporary dialysis catheter placement.  We will need to consult with vascular and vein surgery in order to have her evaluated for AV fistula placement.  Blood pressure 148/51 pulse 74 temperature 99 O2 sats 100% on 2 L nasal cannula urine output 200 cc 03/06/2019 weight 87.1 kg  Sodium 132 potassium 3.9 chloride 101 CO2 20 BUN 90 creatinine 5.37 calcium 8.2 phosphorus greater than 30 albumin 1.8 WBC 6.7 hemoglobin 7.8 platelets 168 hepatitis surface antigen negative hepatitis surface antibody nonreactive.  2D echo ejection fraction 60 -65%  Allopurinol 100 mg daily, amlodipine 10 mg daily, vitamin D3 2000 units daily, Protonix 40 mg daily, Lyrica 50 mg daily,    Objective:  Vital signs in last 24 hours:  Temp:  [98.2 F (36.8 C)-99 F (37.2 C)] 99 F (37.2 C) (05/22 0508) Pulse Rate:  [68-73] 71 (05/22 0945) Resp:  [12-18] 13 (05/22 0945) BP: (112-125)/(46-53) 125/47 (05/22 0945) SpO2:  [95 %-98 %] 98 % (05/22 0945) Weight:  [81.7 kg] 81.7 kg (05/21 2213)  Weight change: -0.162 kg Filed Weights   02/25/19 1012 03/05/19 0800 03/06/19 2213  Weight: 81.2 kg 81.9 kg 81.7 kg     Intake/Output: I/O last 3 completed shifts: In: 340 [P.O.:340] Out: 200 [Urine:200]   Intake/Output this shift:  No intake/output data recorded.  Frail poor appearing lady no obvious distress JVP not elevated Heart sounds are regular rate and rhythm with faint systolic murmur left sternal edge Respiratory lung fields are clear to all station no wheezes or rales Abdomen soft nontender bowel sounds present no organosplenomegaly Extremity revealed no evidence of edema   Basic Metabolic Panel: Recent Labs  Lab 03/03/19 0239 03/04/19 0310 03/05/19 0251 03/06/19 0440 03/07/19 0646  NA 134* 134* 134* 132* 132*  K 4.2 4.2 3.7 3.6 3.9  CL 103 102 103 100 101  CO2 19* 21* 22 20* 20*  GLUCOSE 129* 123* 94 89 79  BUN 72* 83* 92* 87* 90*  CREATININE 4.16* 4.54* 4.77* 5.12* 5.37*  CALCIUM 9.2 9.0 8.5* 8.3* 8.2*  PHOS 3.6 3.2 3.3 RESULTS UNAVAILABLE DUE TO INTERFERING SUBSTANCE >30.0*    Liver Function Tests: Recent Labs  Lab 03/03/19 0239 03/04/19 0310 03/05/19 0251 03/06/19 0440 03/07/19 0646  ALBUMIN 2.5* 2.5* 2.1* 1.9* 1.8*   No results for input(s): LIPASE, AMYLASE in the last 168 hours. No results for input(s): AMMONIA in the last 168 hours.  CBC: Recent Labs  Lab 03/02/19 0300  03/03/19 0239 03/04/19 0310 03/05/19 0251 03/06/19 0440 03/07/19 0646  WBC 7.6  --  6.8 6.7 6.6 7.1 6.7  NEUTROABS 6.1  --   --  5.0  --   --   --   HGB 6.8*   < > 7.6* 7.9*  8.2* 8.3* 7.8*  HCT 21.2*   < > 23.3* 24.9* 26.2* 25.7* 23.7*  MCV 91.4  --  88.6 89.6 90.7 86.5 85.6  PLT 162  --  157 177 181 194 168   < > = values in this interval not displayed.    Cardiac Enzymes: Recent Labs  Lab 02/28/19 1000 02/28/19 1504 02/28/19 2118  TROPONINI 0.12* 0.15* 0.20*    BNP: Invalid input(s): POCBNP  CBG: Recent Labs  Lab 03/06/19 1652 03/06/19 2214 03/07/19 0504 03/07/19 0648 03/07/19 0717  GLUCAP 81 77 73 68* 113*    Microbiology: Results for orders placed or  performed during the hospital encounter of 02/25/19  SARS Coronavirus 2 (CEPHEID - Performed in [place] hospital lab), Hosp Order     Status: None   Collection Time: 02/25/19 11:58 AM  Result Value Ref Range Status   SARS Coronavirus 2 NEGATIVE NEGATIVE Final    Comment: (West) If result is NEGATIVE SARS-CoV-2 target nucleic acids are NOT DETECTED. The SARS-CoV-2 RNA is generally detectable in upper and lower  respiratory specimens during the acute phase of infection. The lowest  concentration of SARS-CoV-2 viral copies this assay can detect is 250  copies / mL. A negative result does not preclude SARS-CoV-2 infection  and should not be used as the sole basis for treatment or other  patient management decisions.  A negative result may occur with  improper specimen collection / handling, submission of specimen other  than nasopharyngeal swab, presence of viral mutation(s) within the  areas targeted by this assay, and inadequate number of viral copies  (<250 copies / mL). A negative result must be combined with clinical  observations, patient history, and epidemiological information. If result is POSITIVE SARS-CoV-2 target nucleic acids are DETECTED. The SARS-CoV-2 RNA is generally detectable in upper and lower  respiratory specimens dur ing the acute phase of infection.  Positive  results are indicative of active infection with SARS-CoV-2.  Clinical  correlation with patient history and other diagnostic information is  necessary to determine patient infection status.  Positive results do  not rule out bacterial infection or co-infection with other viruses. If result is PRESUMPTIVE POSTIVE SARS-CoV-2 nucleic acids MAY BE PRESENT.   A presumptive positive result was obtained on the submitted specimen  and confirmed on repeat testing.  While 2019 novel coronavirus  (SARS-CoV-2) nucleic acids may be present in the submitted sample  additional confirmatory testing may be necessary for  epidemiological  and / or clinical management purposes  to differentiate between  SARS-CoV-2 and other Sarbecovirus currently known to infect humans.  If clinically indicated additional testing with an alternate test  methodology [phone]) is advised. The SARS-CoV-2 RNA is generally  detectable in upper and lower respiratory sp ecimens during the acute  phase of infection. The expected result is Negative. Fact Sheet for Patients:  StrictlyIdeas.no Fact Sheet for Healthcare Providers: BankingDealers.co.za This test is not yet approved or cleared by the [place] FDA and has been authorized for detection and/or diagnosis of SARS-CoV-2 by FDA under an Emergency Use Authorization (EUA).  This EUA will remain in effect (meaning this test can be used) for the duration of the COVID-19 declaration under Section 564(b)(1) of the Act, 21 U.S.C. section 360bbb-3(b)(1), unless the authorization is terminated or revoked sooner. Performed at [facility], [place] [address] [place], [place] [address]   Culture, Urine     Status: Abnormal   Collection Time: 02/26/19  7:02 PM  Result  Value Ref Range Status   Specimen Description   Final    URINE, CLEAN CATCH Performed at [facility], [place] [address], [place], [place] [address]    Special Requests   Final    NONE Performed at [facility], [place] [address] [place], [place] [address]    Culture (A)  Final    50,000 COLONIES/mL LACTOBACILLUS SPECIES Standardized susceptibility testing for this organism is not available. Performed at [facility], [place] [address] [place], [place] [address]    Report Status 02/27/2019 FINAL  Final  MRSA PCR Screening     Status: None   Collection Time: 02/28/19 12:45 PM  Result Value Ref Range Status   MRSA by PCR NEGATIVE NEGATIVE Final    Comment:        The GeneXpert MRSA Assay (FDA approved for  NASAL specimens only), is one component of a comprehensive MRSA colonization surveillance program. It is not intended to diagnose MRSA infection nor to guide or monitor treatment for MRSA infections. Performed at [facility], [place] [address] [place], [place] [address]     Coagulation Studies: No results for input(s): LABPROT, INR in the last 72 hours.  Urinalysis: No results for input(s): COLORURINE, LABSPEC, PHURINE, GLUCOSEU, HGBUR, BILIRUBINUR, KETONESUR, PROTEINUR, UROBILINOGEN, NITRITE, LEUKOCYTESUR in the last 72 hours.  Invalid input(s): APPERANCEUR    Imaging: No results found.   Medications:   . sodium chloride Stopped (03/03/19 0440)  . ferumoxytol Stopped (02/28/19 1534)  . vancomycin 1,000 mg (03/07/19 0375)   . fentaNYL      . gelatin adsorbable      . heparin      . lidocaine      . midazolam      . allopurinol  100 mg Oral Daily  . amLODipine  10 mg Oral Daily  . Chlorhexidine Gluconate Cloth  6 each Topical Daily  . cholecalciferol  2,000 Units Oral Daily  . dorzolamide  1 drop Left Eye QPM  . fluorometholone  1 drop Right Eye TID  . insulin aspart  0-9 Units Subcutaneous TID WC  . isosorbide-hydrALAZINE  1 tablet Oral TID  . latanoprost  1 drop Left Eye QHS  . mouth rinse  15 mL Mouth Rinse BID  . pantoprazole  40 mg Oral Daily  . polyethylene glycol  17 g Oral Daily  . pregabalin  50 mg Oral Daily  . senna-docusate  2 tablet Oral BID   acetaminophen, calcium carbonate, fentaNYL (SUBLIMAZE) injection, hydrALAZINE, ondansetron  Assessment/ Plan:   Acute kidney injury now with uremic signs and symptoms decision has been made to initiate patient on dialysis.  Patient is scheduled for pre-dialysis catheter placement and dialysis treatment.  We will need to consult vein and vascular surgery for vein mapping and also placement of AV fistula.  Multiple myeloma.  Awaiting cytogenetics followed by oncology appreciate assistance from  Dr. Alvy Bimler  Anemia.  Defer treatment to hematology oncology.  Will check iron studies  Bones will check PTH  Hypertension volume appears to be stable euvolemic clinically 2D echo with EF 60%    LOS: [place] @TODAY @9 :52 AM

## 2019-03-07 NOTE — Progress Notes (Signed)
Pt ready for CPAP, but waiting for night meds. Pt has home CPAP at bedside. RT Spoke with RN, and RN will assist pt with home CPAP after meds are taken. RT will continue to monitor.

## 2019-03-08 ENCOUNTER — Inpatient Hospital Stay (HOSPITAL_COMMUNITY): Payer: Medicare HMO

## 2019-03-08 DIAGNOSIS — Z01810 Encounter for preprocedural cardiovascular examination: Secondary | ICD-10-CM

## 2019-03-08 LAB — CBC WITH DIFFERENTIAL/PLATELET
Abs Immature Granulocytes: 0.05 10*3/uL (ref 0.00–0.07)
Basophils Absolute: 0 10*3/uL (ref 0.0–0.1)
Basophils Relative: 0 %
Eosinophils Absolute: 0.2 10*3/uL (ref 0.0–0.5)
Eosinophils Relative: 3 %
HCT: 23.6 % — ABNORMAL LOW (ref 36.0–46.0)
Hemoglobin: 7.8 g/dL — ABNORMAL LOW (ref 12.0–15.0)
Immature Granulocytes: 1 %
Lymphocytes Relative: 24 %
Lymphs Abs: 1.5 10*3/uL (ref 0.7–4.0)
MCH: 28.8 pg (ref 26.0–34.0)
MCHC: 33.1 g/dL (ref 30.0–36.0)
MCV: 87.1 fL (ref 80.0–100.0)
Monocytes Absolute: 0.6 10*3/uL (ref 0.1–1.0)
Monocytes Relative: 10 %
Neutro Abs: 3.8 10*3/uL (ref 1.7–7.7)
Neutrophils Relative %: 62 %
Platelets: 157 10*3/uL (ref 150–400)
RBC: 2.71 MIL/uL — ABNORMAL LOW (ref 3.87–5.11)
RDW: 16.4 % — ABNORMAL HIGH (ref 11.5–15.5)
WBC: 6.1 10*3/uL (ref 4.0–10.5)
nRBC: 0 % (ref 0.0–0.2)

## 2019-03-08 LAB — GLUCOSE, CAPILLARY
Glucose-Capillary: 92 mg/dL (ref 70–99)
Glucose-Capillary: 103 mg/dL — ABNORMAL HIGH (ref 70–99)
Glucose-Capillary: 109 mg/dL — ABNORMAL HIGH (ref 70–99)
Glucose-Capillary: 100 mg/dL — ABNORMAL HIGH (ref 70–99)

## 2019-03-08 LAB — PARATHYROID HORMONE, INTACT (NO CA): PTH: 17 pg/mL (ref 15–65)

## 2019-03-08 MED ORDER — SEVELAMER CARBONATE 800 MG PO TABS: 800.0000 mg | ORAL_TABLET | Freq: Three times a day (TID) | ORAL | Status: DC

## 2019-03-08 MED ORDER — HYDROMORPHONE HCL 2 MG PO TABS: 1.0000 mg | ORAL_TABLET | Freq: Four times a day (QID) | ORAL | Status: DC | PRN

## 2019-03-08 MED ORDER — SEVELAMER CARBONATE 800 MG PO TABS
1200.0000 mg | ORAL_TABLET | Freq: Three times a day (TID) | ORAL | Status: DC
  Administered 2019-03-08 – 2019-03-11 (×7): 1200 mg via ORAL
  Filled 2019-03-08 (×7): qty 2

## 2019-03-08 MED ORDER — DARBEPOETIN ALFA 150 MCG/0.3ML IJ SOSY
150.0000 ug | PREFILLED_SYRINGE | Freq: Once | INTRAMUSCULAR | Status: AC
  Administered 2019-03-08: 150 ug via SUBCUTANEOUS
  Filled 2019-03-08: qty 0.3

## 2019-03-08 NOTE — Progress Notes (Signed)
[facility] PROGRESS NOTE  [name] KDX:[id number] DOB: 1929-12-10 DOA: 02/25/2019 PCP: Lucianne Lei, MD   Narrative: 77 AA ? II diabetic OSA on CPAP sick sinus syndrome status post PPM HTN presented to emergency room 02/25/2019 abnormal labs Noted decreased U OP + constipation-significant history for Motrin Tylenol tramadol-also noted to be on HCTZ at home BUN/creatinine 50/4.4 on admit UA straw-colored 30 of protein hypertensive 150s over 60s Nephrology input = multifactorial prerenal insults-nonoliguric-work-up started and eventually revealed elevated M spike light chains anemia hypercalcemia prompting oncology input Oncological work-up performed-started on dexamethasone short-term corticosteroids rasburicase etc. Cardiology consulted 5/16 for pulmonary edema secondary to volume overload-placed on IV nitroglycerin  Patient was eventually transferred to [facility] 5/21  Received HD 5/22 Vasc planning perm Access placement 5/26   A & Plan ESRD-to be clipped Hyponatremia/Hypokalemia--currently resolved Phos >30 Multifactorial-myeloma + prior insults over baseline of diabetic neuropathy  dialysis planning started Start renvela 1200, holding vit D/Calcium  Multiple myeloma-IgG type Malignant Hypercalcemia--resolved 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 M spike on admit-Skel survery shows lucencies--Received Dexamethasone/ Bortezomib  transfused x 1 on 5/17, IV iron 5/15 Iron/Arenesp as per Renal HTN-complication of hypotension 5/22 Continuing BiDil 1 tab 3 times daily--amlodipine stoppd 5/22 PRN hydralazine 10 mg IV every 6 as needed for pressure over 160 mg Diabetes + neuropathy nephropathy Will not cover her blood sugar at this time given hypoglycemia in a setting of poor appetite CBG 90-100 Gout  stable at this time  DVT Loveneox  Code Status: FULL  Communication: no family discussion today-disposition Plan: Inpatient   Braelin Costlow, MD  [facility] Via [employer] app  OR -[email or web address] 7PM-7AM contact night coverage as above 03/08/2019, 1:01 PM  LOS: 10 days   Consultants:  Surgery  Nephrology  Procedures:   Bone marrow biopsy 5/19  Renal US 5/12  Bone survey 5/18  Antimicrobials:   None   Interval history/Subjective:  less sleepy overall no pains Not using fentanyl  Objective:  Vitals:  Vitals:   03/07/19 2037 03/08/19 0459  BP: (!) 112/44 (!) 138/44  Pulse: 69 71  Resp:    Temp: 98.3 F (36.8 C) 98.9 F (37.2 C)  SpO2: 95% 97%    Exam:  Awake pleasant talking with daughter on facetime cta b no added sound s1 s2 no m/r/g abd soft nt dn no rebound no gaurd Neuro intact interactive Trace LE edema No asterixis   I have personally reviewed the following:  DATA   Labs:  Phos >30, Alb 1.8  Scheduled Meds: . allopurinol  100 mg Oral Daily  . Chlorhexidine Gluconate Cloth  6 each Topical Daily  . cholecalciferol  2,000 Units Oral Daily  . dorzolamide  1 drop Left Eye QPM  . fluorometholone  1 drop Right Eye TID  . insulin aspart  0-9 Units Subcutaneous TID WC  . isosorbide-hydrALAZINE  1 tablet Oral TID  . latanoprost  1 drop Left Eye QHS  . mouth rinse  15 mL Mouth Rinse BID  . pantoprazole  40 mg Oral Daily  . polyethylene glycol  17 g Oral Daily  . pregabalin  50 mg Oral Daily  . senna-docusate  2 tablet Oral BID   Continuous Infusions: . sodium chloride 10 mL/hr at 03/07/19 2216    Active Problems:   AKI (acute kidney injury) ([place])   Hypokalemia   LOS: 10 days

## 2019-03-08 NOTE — Progress Notes (Signed)
VASCULAR LAB PRELIMINARY  PRELIMINARY  PRELIMINARY  PRELIMINARY  Upper extremity vein mapping completed.    Preliminary report:  See CV proc for preliminary results.   Franco Duley, RVT 03/08/2019, 3:16 PM

## 2019-03-08 NOTE — Progress Notes (Signed)
[facility] ROUNDING NOTE   Subjective:   Is an 85 year old lady with a history of diabetes obstructive sleep apnea history of sick sinus syndrome with placement of permanent pacemaker, hypertension.  She presented with acute kidney injury and was evaluated and found to have a positive M spike, skeletal survey with no impending bone fractures and a bone marrow biopsy performed 03/04/2019 cytogenetics pending.  She was treated with 4 days dexamethasone started on Velcade 03/04/2019 she received Velcade every 7 days.  She was also found to be hypercalcemic on admission that appears to be resolving.  Patient appears to be symptomatically uremic and was transferred from [facility] to [facility] for dialysis treatment.    She underwent placement of right IJ hemodialysis catheter tunneled Dr. Markus Daft radiology.  We consulted Dr. [name] for placement of permanent AV fistula.  Vein mapping ordered.  She is tentatively on the schedule for Tuesday, 03/11/2019   She underwent successful dialysis 03/07/2019 removal of 664 cc.  Blood pressure 138/44 pulse 71 temperature 98.9 O2 sats 97%  Sodium 136 potassium 4.1 chloride 104 CO2 22 BUN 47 creatinine 3.52 glucose 92 calcium 8.0 Albumin 1.8 iron saturations 35% WBC 6.1 hemoglobin 7.8 platelets 157  2D echo ejection fraction 60 -65%  Allopurinol 100 mg daily, amlodipine 10 mg daily, vitamin D3 2000 units daily, Protonix 40 mg daily, Lyrica 50 mg daily, BiDil 30/37.5 mg 3 times daily  Dose of darbepoetin 150 mcg subcutaneously administered 02/27/2019.  IV ferumoxytol 510 mg administered 03/07/2019   Objective:  Vital signs in last 24 hours:  Temp:  [97.5 F (36.4 C)-98.9 F (37.2 C)] 98.9 F (37.2 C) (05/23 0459) Pulse Rate:  [69-76] 71 (05/23 0459) Resp:  [12-19] 15 (05/22 1549) BP: (89-148)/(42-79) 138/44 (05/23 0459) SpO2:  [94 %-100 %] 97 % (05/23 0459) Weight:  [83.2 kg-86.3 kg] 86.3 kg (05/22  2037)  Weight change: 2.662 kg Filed Weights   03/07/19 1310 03/07/19 1549 03/07/19 2037  Weight: 84.4 kg 83.2 kg 86.3 kg    Intake/Output: I/O last 3 completed shifts: In: 120 [P.O.:120] Out: 664 [Other:664]   Intake/Output this shift:  No intake/output data recorded.  Frail poor appearing lady no obvious distress JVP not elevated Heart sounds are regular rate and rhythm with faint systolic murmur left sternal edge Respiratory lung fields are clear to all station no wheezes or rales Abdomen soft nontender bowel sounds present no organosplenomegaly Extremity revealed no evidence of edema   Basic Metabolic Panel: Recent Labs  Lab 03/04/19 0310 03/05/19 0251 03/06/19 0440 03/07/19 0646 03/08/19 0412  NA 134* 134* 132* 132* 136  K 4.2 3.7 3.6 3.9 4.1  CL 102 103 100 101 104  CO2 21* 22 20* 20* 22  GLUCOSE 123* 94 89 79 92  BUN 83* 92* 87* 90* 47*  CREATININE 4.54* 4.77* 5.12* 5.37* 3.52*  CALCIUM 9.0 8.5* 8.3* 8.2* 8.0*  PHOS 3.2 3.3 RESULTS UNAVAILABLE DUE TO INTERFERING SUBSTANCE >30.0* >30.0*    Liver Function Tests: Recent Labs  Lab 03/04/19 0310 03/05/19 0251 03/06/19 0440 03/07/19 0646 03/08/19 0412  ALBUMIN 2.5* 2.1* 1.9* 1.8* 1.8*   No results for input(s): LIPASE, AMYLASE in the last 168 hours. No results for input(s): AMMONIA in the last 168 hours.  CBC: Recent Labs  Lab 03/02/19 0300  03/04/19 0310 03/05/19 0251 03/06/19 0440 03/07/19 0646 03/08/19 0412  WBC 7.6   < > 6.7 6.6 7.1 6.7 6.1  NEUTROABS 6.1  --  5.0  --   --   --  3.8  HGB 6.8*   < > 7.9* 8.2* 8.3* 7.8* 7.8*  HCT 21.2*   < > 24.9* 26.2* 25.7* 23.7* 23.6*  MCV 91.4   < > 89.6 90.7 86.5 85.6 87.1  PLT 162   < > 177 181 194 168 157   < > = values in this interval not displayed.    Cardiac Enzymes: No results for input(s): CKTOTAL, CKMB, CKMBINDEX, TROPONINI in the last 168 hours.  BNP: Invalid input(s): POCBNP  CBG: Recent Labs  Lab 03/07/19 0717 03/07/19 1148  03/07/19 1656 03/07/19 2132 03/08/19 0652  GLUCAP 113* 85 91 100* 92    Microbiology: Results for orders placed or performed during the hospital encounter of 02/25/19  SARS Coronavirus 2 (CEPHEID - Performed in [place] hospital lab), Hosp Order     Status: None   Collection Time: 02/25/19 11:58 AM  Result Value Ref Range Status   SARS Coronavirus 2 NEGATIVE NEGATIVE Final    Comment: (NOTE) If result is NEGATIVE SARS-CoV-2 target nucleic acids are NOT DETECTED. The SARS-CoV-2 RNA is generally detectable in upper and lower  respiratory specimens during the acute phase of infection. The lowest  concentration of SARS-CoV-2 viral copies this assay can detect is 250  copies / mL. A negative result does not preclude SARS-CoV-2 infection  and should not be used as the sole basis for treatment or other  patient management decisions.  A negative result may occur with  improper specimen collection / handling, submission of specimen other  than nasopharyngeal swab, presence of viral mutation(s) within the  areas targeted by this assay, and inadequate number of viral copies  (<250 copies / mL). A negative result must be combined with clinical  observations, patient history, and epidemiological information. If result is POSITIVE SARS-CoV-2 target nucleic acids are DETECTED. The SARS-CoV-2 RNA is generally detectable in upper and lower  respiratory specimens dur ing the acute phase of infection.  Positive  results are indicative of active infection with SARS-CoV-2.  Clinical  correlation with patient history and other diagnostic information is  necessary to determine patient infection status.  Positive results do  not rule out bacterial infection or co-infection with other viruses. If result is PRESUMPTIVE POSTIVE SARS-CoV-2 nucleic acids MAY BE PRESENT.   A presumptive positive result was obtained on the submitted specimen  and confirmed on repeat testing.  While 2019 novel coronavirus   (SARS-CoV-2) nucleic acids may be present in the submitted sample  additional confirmatory testing may be necessary for epidemiological  and / or clinical management purposes  to differentiate between  SARS-CoV-2 and other Sarbecovirus currently known to infect humans.  If clinically indicated additional testing with an alternate test  methodology [phone]) is advised. The SARS-CoV-2 RNA is generally  detectable in upper and lower respiratory sp ecimens during the acute  phase of infection. The expected result is Negative. Fact Sheet for Patients:  StrictlyIdeas.no Fact Sheet for Healthcare Providers: BankingDealers.co.za This test is not yet approved or cleared by the [place] FDA and has been authorized for detection and/or diagnosis of SARS-CoV-2 by FDA under an Emergency Use Authorization (EUA).  This EUA will remain in effect (meaning this test can be used) for the duration of the COVID-19 declaration under Section 564(b)(1) of the Act, 21 U.S.C. section 360bbb-3(b)(1), unless the authorization is terminated or revoked sooner. Performed at [facility], [place] [address], [place], [place] [address]  Culture, Urine     Status: Abnormal   Collection Time: 02/26/19  7:02 PM  Result Value Ref Range Status   Specimen Description   Final    URINE, CLEAN CATCH Performed at [facility], [place] [address] [place], [place] [address]    Special Requests   Final    NONE Performed at [facility], [place] [address] [place], [place] [address]    Culture (A)  Final    50,000 COLONIES/mL LACTOBACILLUS SPECIES Standardized susceptibility testing for this organism is not available. Performed at [facility], [place] [address], [place], [place] [address]    Report Status 02/27/2019 FINAL  Final  MRSA PCR Screening     Status: None   Collection Time: 02/28/19 12:45 PM  Result Value  Ref Range Status   MRSA by PCR NEGATIVE NEGATIVE Final    Comment:        The GeneXpert MRSA Assay (FDA approved for NASAL specimens only), is one component of a comprehensive MRSA colonization surveillance program. It is not intended to diagnose MRSA infection nor to guide or monitor treatment for MRSA infections. Performed at [facility], [place] [address] [place], [place] [address]     Coagulation Studies: No results for input(s): LABPROT, INR in the last 72 hours.  Urinalysis: No results for input(s): COLORURINE, LABSPEC, PHURINE, GLUCOSEU, HGBUR, BILIRUBINUR, KETONESUR, PROTEINUR, UROBILINOGEN, NITRITE, LEUKOCYTESUR in the last 72 hours.  Invalid input(s): APPERANCEUR    Imaging: Ir Fluoro Guide Cv Line Right  Result Date: 03/07/2019 INDICATION: 38 year old with acute kidney injury.  Need for hemodialysis. EXAM: FLUOROSCOPIC AND ULTRASOUND GUIDED PLACEMENT OF A TUNNELED DIALYSIS CATHETER Physician: Stephan Minister. Anselm Pancoast, MD MEDICATIONS: Vancomycin 1 gm IV; The antibiotic was administered within an appropriate time interval prior to skin puncture. ANESTHESIA/SEDATION: Versed 1.0 mg IV; Fentanyl 50 mcg IV; Moderate Sedation Time:  22 minutes The patient was continuously monitored during the procedure by the interventional radiology nurse under my direct supervision. FLUOROSCOPY TIME:  Fluoroscopy Time: 30 seconds, 2 mGy COMPLICATIONS: None immediate. PROCEDURE: Informed consent was obtained for placement of a tunneled dialysis catheter. The patient was placed supine on the interventional table. Ultrasound confirmed a patent right internal jugular vein. Ultrasound images were obtained for documentation. The right side of the neck and chest was prepped and draped in a sterile fashion. The right neck was anesthetized with 1% lidocaine. Maximal barrier sterile technique was utilized including caps, mask, sterile gowns, sterile gloves, sterile drape, hand hygiene and skin  antiseptic. A small incision was made with #11 blade scalpel. A 21 gauge needle directed into the right internal jugular vein with ultrasound guidance. A micropuncture dilator set was placed. A 19 cm tip to cuff Palindrome catheter was selected. The skin below the right clavicle was anesthetized and a small incision was made with an #11 blade scalpel. A subcutaneous tunnel was formed to the vein dermatotomy site. The catheter was brought through the tunnel. The vein dermatotomy site was dilated to accommodate a peel-away sheath. The catheter was placed through the peel-away sheath and directed into the central venous structures. The tip of the catheter was placed at the superior cavoatrial junction with fluoroscopy. Fluoroscopic images were obtained for documentation. Both lumens were found to aspirate and flush well. The proper amount of heparin was flushed in both lumens. The vein dermatotomy site was closed using a single layer of absorbable suture and Dermabond. Gel-Foam was placed in the subcutaneous tract for hemostasis.  The catheter was secured to the skin using Prolene suture. IMPRESSION: Successful placement of a right jugular tunneled dialysis catheter using ultrasound and fluoroscopic guidance. Electronically Signed   By: Markus Daft M.D.   On: 03/07/2019 10:45   Ir US Guide Vasc Access Right  Result Date: 03/07/2019 INDICATION: 82 year old with acute kidney injury.  Need for hemodialysis. EXAM: FLUOROSCOPIC AND ULTRASOUND GUIDED PLACEMENT OF A TUNNELED DIALYSIS CATHETER Physician: Stephan Minister. Anselm Pancoast, MD MEDICATIONS: Vancomycin 1 gm IV; The antibiotic was administered within an appropriate time interval prior to skin puncture. ANESTHESIA/SEDATION: Versed 1.0 mg IV; Fentanyl 50 mcg IV; Moderate Sedation Time:  22 minutes The patient was continuously monitored during the procedure by the interventional radiology nurse under my direct supervision. FLUOROSCOPY TIME:  Fluoroscopy Time: 30 seconds, 2 mGy  COMPLICATIONS: None immediate. PROCEDURE: Informed consent was obtained for placement of a tunneled dialysis catheter. The patient was placed supine on the interventional table. Ultrasound confirmed a patent right internal jugular vein. Ultrasound images were obtained for documentation. The right side of the neck and chest was prepped and draped in a sterile fashion. The right neck was anesthetized with 1% lidocaine. Maximal barrier sterile technique was utilized including caps, mask, sterile gowns, sterile gloves, sterile drape, hand hygiene and skin antiseptic. A small incision was made with #11 blade scalpel. A 21 gauge needle directed into the right internal jugular vein with ultrasound guidance. A micropuncture dilator set was placed. A 19 cm tip to cuff Palindrome catheter was selected. The skin below the right clavicle was anesthetized and a small incision was made with an #11 blade scalpel. A subcutaneous tunnel was formed to the vein dermatotomy site. The catheter was brought through the tunnel. The vein dermatotomy site was dilated to accommodate a peel-away sheath. The catheter was placed through the peel-away sheath and directed into the central venous structures. The tip of the catheter was placed at the superior cavoatrial junction with fluoroscopy. Fluoroscopic images were obtained for documentation. Both lumens were found to aspirate and flush well. The proper amount of heparin was flushed in both lumens. The vein dermatotomy site was closed using a single layer of absorbable suture and Dermabond. Gel-Foam was placed in the subcutaneous tract for hemostasis. The catheter was secured to the skin using Prolene suture. IMPRESSION: Successful placement of a right jugular tunneled dialysis catheter using ultrasound and fluoroscopic guidance. Electronically Signed   By: Markus Daft M.D.   On: 03/07/2019 10:45     Medications:   . sodium chloride 10 mL/hr at 03/07/19 2216   . allopurinol  100 mg Oral  Daily  . Chlorhexidine Gluconate Cloth  6 each Topical Daily  . cholecalciferol  2,000 Units Oral Daily  . dorzolamide  1 drop Left Eye QPM  . fluorometholone  1 drop Right Eye TID  . insulin aspart  0-9 Units Subcutaneous TID WC  . isosorbide-hydrALAZINE  1 tablet Oral TID  . latanoprost  1 drop Left Eye QHS  . mouth rinse  15 mL Mouth Rinse BID  . pantoprazole  40 mg Oral Daily  . polyethylene glycol  17 g Oral Daily  . pregabalin  50 mg Oral Daily  . senna-docusate  2 tablet Oral BID   acetaminophen, calcium carbonate, fentaNYL (SUBLIMAZE) injection, heparin, hydrALAZINE, ondansetron  Assessment/ Plan:   Acute kidney injury now with uremic signs and symptoms decision has been made to initiate patient on dialysis.  Appreciate assistance from radiology and vascular surgery.  [facility] office has been  informed that patient will require placement.  She will need dialysis Monday, 03/10/2019  Multiple myeloma.  Awaiting cytogenetics followed by oncology appreciate assistance from Dr. Alvy Bimler  Anemia.  Iron stores appear adequate.  We will need to proceed with scheduled dose darbepoetin.  I will re-dose her today with 150 mcg.  Bones will check PTH  Hypertension volume appears to be stable euvolemic clinically 2D echo with EF 60%    LOS: [place] _0 _1 :15 AM

## 2019-03-09 LAB — RENAL FUNCTION PANEL
Albumin: 1.8 g/dL — ABNORMAL LOW (ref 3.5–5.0)
Albumin: 1.8 g/dL — ABNORMAL LOW (ref 3.5–5.0)
Anion gap: 10 (ref 5–15)
Anion gap: 13 (ref 5–15)
BUN: 47 mg/dL — ABNORMAL HIGH (ref 8–23)
BUN: 45 mg/dL — ABNORMAL HIGH (ref 8–23)
CO2: 22 mmol/L (ref 22–32)
CO2: 23 mmol/L (ref 22–32)
Calcium: 8 mg/dL — ABNORMAL LOW (ref 8.9–10.3)
Calcium: 8.2 mg/dL — ABNORMAL LOW (ref 8.9–10.3)
Chloride: 104 mmol/L (ref 98–111)
Chloride: 100 mmol/L (ref 98–111)
Creatinine, Ser: 3.52 mg/dL — ABNORMAL HIGH (ref 0.44–1.00)
Creatinine, Ser: 3.95 mg/dL — ABNORMAL HIGH (ref 0.44–1.00)
GFR calc Af Amer: 13 mL/min — ABNORMAL LOW (ref >60)
GFR calc Af Amer: 11 mL/min — ABNORMAL LOW (ref >60)
GFR calc non Af Amer: 11 mL/min — ABNORMAL LOW (ref >60)
GFR calc non Af Amer: 10 mL/min — ABNORMAL LOW (ref >60)
Glucose, Bld: 92 mg/dL (ref 70–99)
Glucose, Bld: 85 mg/dL (ref 70–99)
Phosphorus: >30 mg/dL — ABNORMAL HIGH (ref 2.5–4.6)
Phosphorus: >30 mg/dL — ABNORMAL HIGH (ref 2.5–4.6)
Potassium: 4.1 mmol/L (ref 3.5–5.1)
Potassium: 3.8 mmol/L (ref 3.5–5.1)
Sodium: 136 mmol/L (ref 135–145)
Sodium: 136 mmol/L (ref 135–145)

## 2019-03-09 LAB — CBC
HCT: 23.2 % — ABNORMAL LOW (ref 36.0–46.0)
Hemoglobin: 7.5 g/dL — ABNORMAL LOW (ref 12.0–15.0)
MCH: 28.6 pg (ref 26.0–34.0)
MCHC: 32.3 g/dL (ref 30.0–36.0)
MCV: 88.5 fL (ref 80.0–100.0)
Platelets: 146 10*3/uL — ABNORMAL LOW (ref 150–400)
RBC: 2.62 MIL/uL — ABNORMAL LOW (ref 3.87–5.11)
RDW: 16.4 % — ABNORMAL HIGH (ref 11.5–15.5)
WBC: 5.1 10*3/uL (ref 4.0–10.5)
nRBC: 0.4 % — ABNORMAL HIGH (ref 0.0–0.2)

## 2019-03-09 LAB — GLUCOSE, CAPILLARY
Glucose-Capillary: 74 mg/dL (ref 70–99)
Glucose-Capillary: 109 mg/dL — ABNORMAL HIGH (ref 70–99)
Glucose-Capillary: 98 mg/dL (ref 70–99)
Glucose-Capillary: 103 mg/dL — ABNORMAL HIGH (ref 70–99)

## 2019-03-09 MED ORDER — CHLORHEXIDINE GLUCONATE CLOTH 2 % EX PADS
6.0000 | MEDICATED_PAD | Freq: Every day | CUTANEOUS | Status: DC
  Administered 2019-03-09 – 2019-03-11 (×3): 6 via TOPICAL

## 2019-03-09 MED ORDER — ISOSORB DINITRATE-HYDRALAZINE 20-37.5 MG PO TABS
1.0000 | ORAL_TABLET | Freq: Two times a day (BID) | ORAL | Status: DC
  Administered 2019-03-09 – 2019-03-10 (×4): 1 via ORAL
  Filled 2019-03-09 (×5): qty 1

## 2019-03-09 NOTE — Progress Notes (Signed)
[facility] ROUNDING NOTE   Subjective:   Is an 85 year old lady with a history of diabetes obstructive sleep apnea history of sick sinus syndrome with placement of permanent pacemaker, hypertension.  She presented with acute kidney injury and was evaluated and found to have a positive M spike, skeletal survey with no impending bone fractures and a bone marrow biopsy performed 03/04/2019 cytogenetics pending.  She was treated with 4 days dexamethasone started on Velcade 03/04/2019 she received Velcade every 7 days.  She was also found to be hypercalcemic on admission that appears to be resolving.    Patient appears to be symptomatically uremic and was transferred from [facility] to [facility] for dialysis treatment 03/07/2019.  She underwent placement of right IJ hemodialysis catheter tunneled Dr. Markus Daft radiology.  We consulted Dr. [name] for placement of permanent AV fistula.  Vein mapping ordered.  She is tentatively on the schedule for Tuesday, 03/11/2019   She underwent successful dialysis 03/07/2019 removal of 664 cc.  Plan dialysis 03/10/2019  Blood pressure 122/55 pulse 70 temperature 98.3 O2 sats 98% room air  Sodium 136 potassium 3.8 chloride 100 CO2 2345 3.95 glucose 85 calcium 8.2 phosphorus greater than 30 albumin 1.8 WBCs 5.1 hemoglobin 7.5 platelets 146  2D echo ejection fraction 60 -65%  Allopurinol 100 mg daily, amlodipine 10 mg daily,  Protonix 40 mg daily, Lyrica 50 mg daily, BiDil 30/37.5 mg 3 times daily, Sevelemer female 1.2 g 3 times daily  Dose of darbepoetin 150 mcg subcutaneously administered 03/08/2019.  IV ferumoxytol 510 mg administered 03/07/2019   Objective:  Vital signs in last 24 hours:  Temp:  [98.3 F (36.8 C)-98.9 F (37.2 C)] 98.3 F (36.8 C) (05/24 0858) Pulse Rate:  [69-71] 70 (05/24 0901) Resp:  [1-16] 16 (05/24 0534) BP: (107-127)/(39-55) 123/55 (05/24 0901) SpO2:  [97 %-99 %] 98 % (05/24 0858) Weight:   [86.3 kg] 86.3 kg (05/24 0500)  Weight change: 1.9 kg Filed Weights   03/07/19 2037 03/08/19 2043 03/09/19 0500  Weight: 86.3 kg 86.3 kg 86.3 kg    Intake/Output: I/O last 3 completed shifts: In: 740 [P.O.:740] Out: 0    Intake/Output this shift:  No intake/output data recorded.  Frail poor appearing lady no obvious distress JVP not elevated Heart sounds are regular rate and rhythm with faint systolic murmur left sternal edge Respiratory lung fields are clear to all station no wheezes or rales Abdomen soft nontender bowel sounds present no organosplenomegaly Extremity revealed no evidence of edema   Basic Metabolic Panel: Recent Labs  Lab 03/05/19 0251 03/06/19 0440 03/07/19 0646 03/08/19 0412 03/09/19 0735  NA 134* 132* 132* 136 136  K 3.7 3.6 3.9 4.1 3.8  CL 103 100 101 104 100  CO2 22 20* 20* 22 23  GLUCOSE 94 89 79 92 85  BUN 92* 87* 90* 47* 45*  CREATININE 4.77* 5.12* 5.37* 3.52* 3.95*  CALCIUM 8.5* 8.3* 8.2* 8.0* 8.2*  PHOS 3.3 RESULTS UNAVAILABLE DUE TO INTERFERING SUBSTANCE >30.0* >30.0* >30.0*    Liver Function Tests: Recent Labs  Lab 03/05/19 0251 03/06/19 0440 03/07/19 0646 03/08/19 0412 03/09/19 0735  ALBUMIN 2.1* 1.9* 1.8* 1.8* 1.8*   No results for input(s): LIPASE, AMYLASE in the last 168 hours. No results for input(s): AMMONIA in the last 168 hours.  CBC: Recent Labs  Lab 03/04/19 0310 03/05/19 0251 03/06/19 0440 03/07/19 0646 03/08/19 0412 03/09/19 0735  WBC 6.7 6.6 7.1 6.7 6.1 5.1  NEUTROABS 5.0  --   --   --  3.8  --   HGB 7.9* 8.2* 8.3* 7.8* 7.8* 7.5*  HCT 24.9* 26.2* 25.7* 23.7* 23.6* 23.2*  MCV 89.6 90.7 86.5 85.6 87.1 88.5  PLT 177 181 194 168 157 146*    Cardiac Enzymes: No results for input(s): CKTOTAL, CKMB, CKMBINDEX, TROPONINI in the last 168 hours.  BNP: Invalid input(s): POCBNP  CBG: Recent Labs  Lab 03/08/19 0652 03/08/19 1156 03/08/19 1621 03/08/19 2043 03/09/19 0703  GLUCAP 92 103* 109* 100* 60     Microbiology: Results for orders placed or performed during the hospital encounter of 02/25/19  SARS Coronavirus 2 (CEPHEID - Performed in [place] hospital lab), Hosp Order     Status: None   Collection Time: 02/25/19 11:58 AM  Result Value Ref Range Status   SARS Coronavirus 2 NEGATIVE NEGATIVE Final    Comment: (NOTE) If result is NEGATIVE SARS-CoV-2 target nucleic acids are NOT DETECTED. The SARS-CoV-2 RNA is generally detectable in upper and lower  respiratory specimens during the acute phase of infection. The lowest  concentration of SARS-CoV-2 viral copies this assay can detect is 250  copies / mL. A negative result does not preclude SARS-CoV-2 infection  and should not be used as the sole basis for treatment or other  patient management decisions.  A negative result may occur with  improper specimen collection / handling, submission of specimen other  than nasopharyngeal swab, presence of viral mutation(s) within the  areas targeted by this assay, and inadequate number of viral copies  (<250 copies / mL). A negative result must be combined with clinical  observations, patient history, and epidemiological information. If result is POSITIVE SARS-CoV-2 target nucleic acids are DETECTED. The SARS-CoV-2 RNA is generally detectable in upper and lower  respiratory specimens dur ing the acute phase of infection.  Positive  results are indicative of active infection with SARS-CoV-2.  Clinical  correlation with patient history and other diagnostic information is  necessary to determine patient infection status.  Positive results do  not rule out bacterial infection or co-infection with other viruses. If result is PRESUMPTIVE POSTIVE SARS-CoV-2 nucleic acids MAY BE PRESENT.   A presumptive positive result was obtained on the submitted specimen  and confirmed on repeat testing.  While 2019 novel coronavirus  (SARS-CoV-2) nucleic acids may be present in the submitted sample   additional confirmatory testing may be necessary for epidemiological  and / or clinical management purposes  to differentiate between  SARS-CoV-2 and other Sarbecovirus currently known to infect humans.  If clinically indicated additional testing with an alternate test  methodology [phone]) is advised. The SARS-CoV-2 RNA is generally  detectable in upper and lower respiratory sp ecimens during the acute  phase of infection. The expected result is Negative. Fact Sheet for Patients:  StrictlyIdeas.no Fact Sheet for Healthcare Providers: BankingDealers.co.za This test is not yet approved or cleared by the [place] FDA and has been authorized for detection and/or diagnosis of SARS-CoV-2 by FDA under an Emergency Use Authorization (EUA).  This EUA will remain in effect (meaning this test can be used) for the duration of the COVID-19 declaration under Section 564(b)(1) of the Act, 21 U.S.C. section 360bbb-3(b)(1), unless the authorization is terminated or revoked sooner. Performed at [facility], [place] [address] [place], [place] [address]   Culture, Urine     Status: Abnormal   Collection Time: 02/26/19  7:02 PM  Result Value Ref Range Status   Specimen Description   Final    URINE, CLEAN  CATCH Performed at [facility], [place] [address], [place], [place] [address]    Special Requests   Final    NONE Performed at [facility], [place] [address], [place], [place] [address]    Culture (A)  Final    50,000 COLONIES/mL LACTOBACILLUS SPECIES Standardized susceptibility testing for this organism is not available. Performed at [facility], [place] [address], [place], [place] [address]    Report Status 02/27/2019 FINAL  Final  MRSA PCR Screening     Status: None   Collection Time: 02/28/19 12:45 PM  Result Value Ref Range Status   MRSA by PCR NEGATIVE NEGATIVE Final    Comment:         The GeneXpert MRSA Assay (FDA approved for NASAL specimens only), is one component of a comprehensive MRSA colonization surveillance program. It is not intended to diagnose MRSA infection nor to guide or monitor treatment for MRSA infections. Performed at [facility], [place] [address] [place], [place] [address]     Coagulation Studies: No results for input(s): LABPROT, INR in the last 72 hours.  Urinalysis: No results for input(s): COLORURINE, LABSPEC, PHURINE, GLUCOSEU, HGBUR, BILIRUBINUR, KETONESUR, PROTEINUR, UROBILINOGEN, NITRITE, LEUKOCYTESUR in the last 72 hours.  Invalid input(s): APPERANCEUR    Imaging: Ir Fluoro Guide Cv Line Right  Result Date: 03/07/2019 INDICATION: 48 year old with acute kidney injury.  Need for hemodialysis. EXAM: FLUOROSCOPIC AND ULTRASOUND GUIDED PLACEMENT OF A TUNNELED DIALYSIS CATHETER Physician: Stephan Minister. Anselm Pancoast, MD MEDICATIONS: Vancomycin 1 gm IV; The antibiotic was administered within an appropriate time interval prior to skin puncture. ANESTHESIA/SEDATION: Versed 1.0 mg IV; Fentanyl 50 mcg IV; Moderate Sedation Time:  22 minutes The patient was continuously monitored during the procedure by the interventional radiology nurse under my direct supervision. FLUOROSCOPY TIME:  Fluoroscopy Time: 30 seconds, 2 mGy COMPLICATIONS: None immediate. PROCEDURE: Informed consent was obtained for placement of a tunneled dialysis catheter. The patient was placed supine on the interventional table. Ultrasound confirmed a patent right internal jugular vein. Ultrasound images were obtained for documentation. The right side of the neck and chest was prepped and draped in a sterile fashion. The right neck was anesthetized with 1% lidocaine. Maximal barrier sterile technique was utilized including caps, mask, sterile gowns, sterile gloves, sterile drape, hand hygiene and skin antiseptic. A small incision was made with #11 blade scalpel. A 21 gauge needle  directed into the right internal jugular vein with ultrasound guidance. A micropuncture dilator set was placed. A 19 cm tip to cuff Palindrome catheter was selected. The skin below the right clavicle was anesthetized and a small incision was made with an #11 blade scalpel. A subcutaneous tunnel was formed to the vein dermatotomy site. The catheter was brought through the tunnel. The vein dermatotomy site was dilated to accommodate a peel-away sheath. The catheter was placed through the peel-away sheath and directed into the central venous structures. The tip of the catheter was placed at the superior cavoatrial junction with fluoroscopy. Fluoroscopic images were obtained for documentation. Both lumens were found to aspirate and flush well. The proper amount of heparin was flushed in both lumens. The vein dermatotomy site was closed using a single layer of absorbable suture and Dermabond. Gel-Foam was placed in the subcutaneous tract for hemostasis. The catheter was secured to the skin using Prolene suture. IMPRESSION: Successful placement of a right jugular tunneled dialysis catheter using ultrasound and fluoroscopic guidance. Electronically Signed   By: Scherrie Gerlach.D.  On: 03/07/2019 10:45   Ir US Guide Vasc Access Right  Result Date: 03/07/2019 INDICATION: 34 year old with acute kidney injury.  Need for hemodialysis. EXAM: FLUOROSCOPIC AND ULTRASOUND GUIDED PLACEMENT OF A TUNNELED DIALYSIS CATHETER Physician: Stephan Minister. Anselm Pancoast, MD MEDICATIONS: Vancomycin 1 gm IV; The antibiotic was administered within an appropriate time interval prior to skin puncture. ANESTHESIA/SEDATION: Versed 1.0 mg IV; Fentanyl 50 mcg IV; Moderate Sedation Time:  22 minutes The patient was continuously monitored during the procedure by the interventional radiology nurse under my direct supervision. FLUOROSCOPY TIME:  Fluoroscopy Time: 30 seconds, 2 mGy COMPLICATIONS: None immediate. PROCEDURE: Informed consent was obtained for placement of  a tunneled dialysis catheter. The patient was placed supine on the interventional table. Ultrasound confirmed a patent right internal jugular vein. Ultrasound images were obtained for documentation. The right side of the neck and chest was prepped and draped in a sterile fashion. The right neck was anesthetized with 1% lidocaine. Maximal barrier sterile technique was utilized including caps, mask, sterile gowns, sterile gloves, sterile drape, hand hygiene and skin antiseptic. A small incision was made with #11 blade scalpel. A 21 gauge needle directed into the right internal jugular vein with ultrasound guidance. A micropuncture dilator set was placed. A 19 cm tip to cuff Palindrome catheter was selected. The skin below the right clavicle was anesthetized and a small incision was made with an #11 blade scalpel. A subcutaneous tunnel was formed to the vein dermatotomy site. The catheter was brought through the tunnel. The vein dermatotomy site was dilated to accommodate a peel-away sheath. The catheter was placed through the peel-away sheath and directed into the central venous structures. The tip of the catheter was placed at the superior cavoatrial junction with fluoroscopy. Fluoroscopic images were obtained for documentation. Both lumens were found to aspirate and flush well. The proper amount of heparin was flushed in both lumens. The vein dermatotomy site was closed using a single layer of absorbable suture and Dermabond. Gel-Foam was placed in the subcutaneous tract for hemostasis. The catheter was secured to the skin using Prolene suture. IMPRESSION: Successful placement of a right jugular tunneled dialysis catheter using ultrasound and fluoroscopic guidance. Electronically Signed   By: Markus Daft M.D.   On: 03/07/2019 10:45   Vas [place] Upper Extremity Arterial Duplex  Result Date: 03/08/2019 UPPER EXTREMITY DUPLEX STUDY Indications: Pre operative for dialysis access.  Comparison Study: No prior study  Performing Technologist: Sharion Dove RVS  Examination Guidelines: A complete evaluation includes B-mode imaging, spectral Doppler, color Doppler, and power Doppler as needed of all accessible portions of each vessel. Bilateral testing is considered an integral part of a complete examination. Limited examinations for reoccurring indications may be performed as noted.  Right Doppler Findings: +--------+----------+---------+-----------+--------+ Site    PSV (cm/s)Waveform Plaque     Comments +--------+----------+---------+-----------+--------+ [id number]       triphasicno stenosis         +--------+----------+---------+-----------+--------+ Radial  83        triphasicno stenosis         +--------+----------+---------+-----------+--------+ Ulnar   68        triphasicno stenosistortuous +--------+----------+---------+-----------+--------+ Right Pre-Dialysis Findings: +-----------------------+----------+--------------------+---------+--------+ Location               PSV (cm/s)Intralum. Diam. (cm)Waveform Comments +-----------------------+----------+--------------------+---------+--------+ Brachial Antecub. fossa          0.60                triphasic         +-----------------------+----------+--------------------+---------+--------+  Radial Art at Wrist              0.33                triphasic         +-----------------------+----------+--------------------+---------+--------+ Ulnar Art at Wrist               0.32                triphasic         +-----------------------+----------+--------------------+---------+--------+ Left Doppler Findings: +--------+----------+---------+-----------+--------+ Site    PSV (cm/s)Waveform Plaque     Comments +--------+----------+---------+-----------+--------+ [id number]        triphasicno stenosis         +--------+----------+---------+-----------+--------+ Radial  182       triphasicno stenosis          +--------+----------+---------+-----------+--------+ Ulnar   133       triphasicno stenosis         +--------+----------+---------+-----------+--------+ Left Pre-Dialysis Findings: +-----------------------+----------+--------------------+---------+--------+ Location               PSV (cm/s)Intralum. Diam. (cm)Waveform Comments +-----------------------+----------+--------------------+---------+--------+ Brachial Antecub. fossa          0.65                triphasic         +-----------------------+----------+--------------------+---------+--------+ Radial Art at Wrist              0.33                triphasic         +-----------------------+----------+--------------------+---------+--------+ Ulnar Art at Wrist               0.22                triphasic         +-----------------------+----------+--------------------+---------+--------+  Summary:  Right: No obstruction visualized in the right upper extremity. Left: No obstruction visualized in the left upper extremity. *See table(s) above for measurements and observations. Electronically signed by [name] MD on 03/08/2019 at 5:49:17 PM.    Final    Vas [place] Upper Ext Vein Mapping (pre-op Avf)  Result Date: 03/08/2019 UPPER EXTREMITY VEIN MAPPING  Indications: Pre-dialysis access. Limitations: Bandages, IV lines Comparison Study: No prior study on file for comparison Performing Technologist: Sharion Dove RVS  Examination Guidelines: A complete evaluation includes B-mode imaging, spectral Doppler, color Doppler, and power Doppler as needed of all accessible portions of each vessel. Bilateral testing is considered an integral part of a complete examination. Limited examinations for reoccurring indications may be performed as noted. +-----------------+-------------+----------+---------+ Right Cephalic   Diameter (cm)Depth (cm)Findings  +-----------------+-------------+----------+---------+ Prox upper arm       0.50         0.44             +-----------------+-------------+----------+---------+ Mid upper arm        0.58        0.48             +-----------------+-------------+----------+---------+ Dist upper arm       0.60        0.46             +-----------------+-------------+----------+---------+ Antecubital fossa    0.70        0.42             +-----------------+-------------+----------+---------+ Prox forearm         0.42        0.36   branching +-----------------+-------------+----------+---------+  Mid forearm          0.36        0.55   branching +-----------------+-------------+----------+---------+ Wrist                0.29        0.81             +-----------------+-------------+----------+---------+ +--------------+-------------+----------+---------+ Right Basilic Diameter (cm)Depth (cm)Findings  +--------------+-------------+----------+---------+ Dist upper arm                       branching +--------------+-------------+----------+---------+ +-----------------+-------------+----------+--------------+ Left Cephalic    Diameter (cm)Depth (cm)   Findings    +-----------------+-------------+----------+--------------+ Prox upper arm                          not visualized +-----------------+-------------+----------+--------------+ Mid upper arm        0.41        0.66                  +-----------------+-------------+----------+--------------+ Dist upper arm       0.37        0.59     branching    +-----------------+-------------+----------+--------------+ Antecubital fossa    0.53        0.67                  +-----------------+-------------+----------+--------------+ Prox forearm         0.21        0.24                  +-----------------+-------------+----------+--------------+ Mid forearm          0.12        0.29                  +-----------------+-------------+----------+--------------+ Wrist                0.19        0.33                   +-----------------+-------------+----------+--------------+ +-----------------+-------------+----------+----------------------------+ Left Basilic     Diameter (cm)Depth (cm)          Findings           +-----------------+-------------+----------+----------------------------+ Prox upper arm                                 not visualized        +-----------------+-------------+----------+----------------------------+ Mid upper arm        0.49        2.24              origin            +-----------------+-------------+----------+----------------------------+ Dist upper arm       0.52        2.34            branching           +-----------------+-------------+----------+----------------------------+ Antecubital fossa    0.30        0.96                                +-----------------+-------------+----------+----------------------------+ Prox forearm  not visualized        +-----------------+-------------+----------+----------------------------+ Mid forearm                             branching and not visualized +-----------------+-------------+----------+----------------------------+ Wrist                                          not visualized        +-----------------+-------------+----------+----------------------------+ *See table(s) above for measurements and observations.  Diagnosing physician: [name] MD Electronically signed by [name] MD on 03/08/2019 at 5:50:00 PM.    Final      Medications:   . sodium chloride 10 mL/hr at 03/07/19 2216   . allopurinol  100 mg Oral Daily  . Chlorhexidine Gluconate Cloth  6 each Topical Daily  . cholecalciferol  2,000 Units Oral Daily  . dorzolamide  1 drop Left Eye QPM  . fluorometholone  1 drop Right Eye TID  . insulin aspart  0-9 Units Subcutaneous TID WC  . isosorbide-hydrALAZINE  1 tablet Oral BID  . latanoprost  1 drop Left Eye QHS  . mouth rinse   15 mL Mouth Rinse BID  . pantoprazole  40 mg Oral Daily  . polyethylene glycol  17 g Oral Daily  . pregabalin  50 mg Oral Daily  . senna-docusate  2 tablet Oral BID  . sevelamer carbonate  1,200 mg Oral TID WC   acetaminophen, heparin, hydrALAZINE, HYDROmorphone, ondansetron  Assessment/ Plan:   Acute kidney injury now with uremic signs and symptoms decision has been made to initiate patient on dialysis.  Appreciate assistance from radiology and vascular surgery. Clip office has been informed that patient will require placement.  She will need dialysis Monday, 03/10/2019.  Decrease calcium bath to 2.25.  This will be her second dialysis treatment first dialysis treatment 03/07/2019.  Multiple myeloma.  Awaiting cytogenetics followed by oncology appreciate assistance from Dr. Alvy Bimler  Anemia.  Iron stores appear adequate.  We will need to proceed with scheduled dose darbepoetin.  Patient re-dose 03/08/2019 150 mcg darbepoetin.  Bones PTH 17 03/07/2019   elevated calcium and vitamin D has been discontinued.  No additional calcium corrected calcium levels of 10.  Hypertension volume appears to be stable euvolemic clinically 2D echo with EF 60%    LOS: [place] _0 _1 :52 AM

## 2019-03-09 NOTE — Progress Notes (Signed)
Pt places self on home CPAP. Informed pt to call this RT if she needed and assistance

## 2019-03-09 NOTE — Progress Notes (Signed)
   VASCULAR SURGERY ASSESSMENT & PLAN:   ESRD: I have reviewed her arterial duplex and vein mapping.  She appears to be a candidate for a fistula in the right arm.  This has been scheduled for Tuesday.  She has a functioning dialysis catheter.  It looks like she is scheduled for dialysis on Monday, 03/10/2019.  Since she is scheduled for surgery on Tuesday please do not do dialysis on Tuesday.  Thank you.  SUBJECTIVE:   No complaints this morning.  I explained her that we plan on proceeding with placement of the fistula on Tuesday.  We have discussed the procedure and potential complications and she is agreeable to proceed.  All of her questions were answered.  PHYSICAL EXAM:   Vitals:   03/08/19 1623 03/08/19 2043 03/09/19 0500 03/09/19 0534  BP: (!) 107/39 (!) 123/50  (!) 120/47  Pulse: 71 70  69  Resp: (!) 1 16  16   Temp: 98.9 F (37.2 C) 98.5 F (36.9 C)  98.5 F (36.9 C)  TempSrc: Oral Oral  Oral  SpO2: 99% 97%  98%  Weight:  86.3 kg 86.3 kg   Height:       Palpable right radial pulse.  LABS:   ARTERIAL DUPLEX: I have independently interpreted her arterial duplex scan yesterday.  On the right side she has a triphasic radial and ulnar signal.  The brachial artery measures 0.6 cm in diameter.  On the left side, she has a triphasic radial and ulnar signal.  The brachial artery measures 0.65 cm in diameter.  VEIN MAP: I have independently interpreted her vein map yesterday.  We plan on access in the right arm given that she has a pacemaker on the left.  On the right side the forearm and upper arm cephalic vein look reasonable in size.  Lab Results  Component Value Date   WBC 6.1 03/08/2019   HGB 7.8 (L) 03/08/2019   HCT 23.6 (L) 03/08/2019   MCV 87.1 03/08/2019   PLT 157 03/08/2019   Lab Results  Component Value Date   CREATININE 3.52 (H) 03/08/2019   Lab Results  Component Value Date   INR 1.57 (H) 01/03/2010   CBG (last 3)  Recent Labs    03/08/19 1621  03/08/19 2043 03/09/19 0703  GLUCAP 109* 100* 74    PROBLEM LIST:    Active Problems:   AKI (acute kidney injury) ([place])   Hypokalemia   CURRENT MEDS:   . allopurinol  100 mg Oral Daily  . Chlorhexidine Gluconate Cloth  6 each Topical Daily  . cholecalciferol  2,000 Units Oral Daily  . dorzolamide  1 drop Left Eye QPM  . fluorometholone  1 drop Right Eye TID  . insulin aspart  0-9 Units Subcutaneous TID WC  . isosorbide-hydrALAZINE  1 tablet Oral BID  . latanoprost  1 drop Left Eye QHS  . mouth rinse  15 mL Mouth Rinse BID  . pantoprazole  40 mg Oral Daily  . polyethylene glycol  17 g Oral Daily  . pregabalin  50 mg Oral Daily  . senna-docusate  2 tablet Oral BID  . sevelamer carbonate  1,200 mg Oral TID [facility]    [name] Beeper: [phone] Office: [phone] 03/09/2019

## 2019-03-09 NOTE — Progress Notes (Signed)
[facility] PROGRESS NOTE  ARVA SLAUGH PPJ:[id number] DOB: 1930/01/24 DOA: 02/25/2019 PCP: Lucianne Lei, MD   Narrative: 64 AA ? II diabetic OSA on CPAP sick sinus syndrome status post PPM HTN presented to emergency room 02/25/2019 abnormal labs Noted decreased U OP + constipation-significant history for Motrin Tylenol tramadol-also noted to be on HCTZ at home BUN/creatinine 50/4.4 on admit UA straw-colored 30 of protein hypertensive 150s over 60s Nephrology input = multifactorial prerenal insults-nonoliguric-work-up started and eventually revealed elevated M spike light chains anemia hypercalcemia prompting oncology input Oncological work-up performed-started on dexamethasone short-term corticosteroids rasburicase etc. Cardiology consulted 5/16 for pulmonary edema secondary to volume overload-placed on IV nitroglycerin  Patient was eventually [facility] 5/21  Received HD 5/22 Vasc planning perm Access placement 5/26   A & Plan ESRD-to be clipped Hyponatremia/Hypokalemia--currently resolved Phos >30 Multifactorial-myeloma + prior insults over baseline of diabetic neuropathy  dialysis planning started Start renvela 1200, holding vit D/Calcium -appreciate nephrology Multiple myeloma-IgG type Malignant Hypercalcemia--resolved 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 M spike on admit-Skel survery shows lucencies--Received Dexamethasone/ Bortezomib  transfused x 1 on 5/17, IV iron 5/15 Iron/Arenesp as per Renal HTN-complication of hypotension 5/22 Ef 60% and euvolemic doesn't have HF Cut back Bididl to bid 5/24-amlodipine stoppd 5/22 PRN hydralazine 10 mg IV every 6 as needed for pressure over 160 Diabetes + neuropathy nephropathy Will not cover her blood sugar at this time given hypoglycemia in a setting of poor appetite CBG 70-100 Gout  stable at this time  DVT [place]ene[place]  Code Status: FULL  Communication: called daughter and updated 5/24-disposition Plan:  Inpatient--d/c ~ 5/26 if can be clipped by then for new start to dialysis   Verlon Au, MD  [facility] [place] [email or web address] 7PM-7AM contact night coverage as above 03/09/2019, 1:20 PM  LOS: 11 days   Consultants:  Surgery  Nephrology  Vascular  Procedures:   Bone marrow biopsy 5/19  Renal US 5/12  Bone survey 5/18  Antimicrobials:   None   Interval history/Subjective:  Looks well in chair tol diet no pains  Objective:  Vitals:  Vitals:   03/09/19 0858 03/09/19 0901  BP: (!) 127/44 (!) 123/55  Pulse: 70 70  Resp:    Temp: 98.3 F (36.8 C)   SpO2: 98%     Exam:  Awake pleasant  cta b no added sound--no rales no rhonchi s1 s2 no m/r/g abd soft nt dn no rebound no gaurd Neuro intact interactive Trace LE edema No asterixis  I have personally reviewed the following:  DATA Labs:  Phos >30,   Hb 7.5  Scheduled Meds: . allopurinol  100 mg Oral Daily  . Chlorhexidine Gluconate Cloth  6 each Topical Daily  . Chlorhexidine Gluconate Cloth  6 each Topical Q0600  . cholecalciferol  2,000 Units Oral Daily  . dorzolamide  1 drop Left Eye QPM  . fluorometholone  1 drop Right Eye TID  . insulin aspart  0-9 Units Subcutaneous TID WC  . isosorbide-hydrALAZINE  1 tablet Oral BID  . latanoprost  1 drop Left Eye QHS  . mouth rinse  15 mL Mouth Rinse BID  . pantoprazole  40 mg Oral Daily  . polyethylene glycol  17 g Oral Daily  . pregabalin  50 mg Oral Daily  . senna-docusate  2 tablet Oral BID  . sevelamer carbonate  1,200 mg Oral TID WC   Continuous Infusions: . sodium chloride 10 mL/hr at 03/07/19 2216    Active Problems:  AKI (acute kidney injury) ([place])   Hypokalemia   LOS: 11 days

## 2019-03-10 LAB — RENAL FUNCTION PANEL
Albumin: 1.9 g/dL — ABNORMAL LOW (ref 3.5–5.0)
Anion gap: 12 (ref 5–15)
BUN: 42 mg/dL — ABNORMAL HIGH (ref 8–23)
CO2: 22 mmol/L (ref 22–32)
Calcium: 8.4 mg/dL — ABNORMAL LOW (ref 8.9–10.3)
Chloride: 100 mmol/L (ref 98–111)
Creatinine, Ser: 4.12 mg/dL — ABNORMAL HIGH (ref 0.44–1.00)
GFR calc Af Amer: 11 mL/min — ABNORMAL LOW (ref >60)
GFR calc non Af Amer: 9 mL/min — ABNORMAL LOW (ref >60)
Glucose, Bld: 96 mg/dL (ref 70–99)
Phosphorus: >30 mg/dL — ABNORMAL HIGH (ref 2.5–4.6)
Potassium: 3.9 mmol/L (ref 3.5–5.1)
Sodium: 134 mmol/L — ABNORMAL LOW (ref 135–145)

## 2019-03-10 LAB — GLUCOSE, CAPILLARY
Glucose-Capillary: 72 mg/dL (ref 70–99)
Glucose-Capillary: 91 mg/dL (ref 70–99)
Glucose-Capillary: 67 mg/dL — ABNORMAL LOW (ref 70–99)
Glucose-Capillary: 96 mg/dL (ref 70–99)
Glucose-Capillary: 92 mg/dL (ref 70–99)

## 2019-03-10 LAB — CBC
HCT: 23.8 % — ABNORMAL LOW (ref 36.0–46.0)
Hemoglobin: 7.8 g/dL — ABNORMAL LOW (ref 12.0–15.0)
MCH: 29.1 pg (ref 26.0–34.0)
MCHC: 32.8 g/dL (ref 30.0–36.0)
MCV: 88.8 fL (ref 80.0–100.0)
Platelets: 131 10*3/uL — ABNORMAL LOW (ref 150–400)
RBC: 2.68 MIL/uL — ABNORMAL LOW (ref 3.87–5.11)
RDW: 16.4 % — ABNORMAL HIGH (ref 11.5–15.5)
WBC: 4.9 10*3/uL (ref 4.0–10.5)
nRBC: 0 % (ref 0.0–0.2)

## 2019-03-10 MED ORDER — ALLOPURINOL 100 MG PO TABS
50.0000 mg | ORAL_TABLET | Freq: Every day | ORAL | Status: DC
  Administered 2019-03-10 – 2019-03-11 (×2): 50 mg via ORAL
  Filled 2019-03-10 (×2): qty 1

## 2019-03-10 MED ORDER — VANCOMYCIN HCL IN DEXTROSE 1-5 GM/200ML-% IV SOLN
1000.0000 mg | INTRAVENOUS | Status: AC
  Administered 2019-03-11: 1000 mg via INTRAVENOUS
  Filled 2019-03-10 (×2): qty 200

## 2019-03-10 MED ORDER — NEPRO/CARBSTEADY PO LIQD
237.0000 mL | Freq: Two times a day (BID) | ORAL | Status: DC
  Administered 2019-03-10 – 2019-03-11 (×2): 237 mL via ORAL
  Filled 2019-03-10 (×4): qty 237

## 2019-03-10 MED ORDER — HEPARIN SODIUM (PORCINE) 1000 UNIT/ML IJ SOLN
INTRAMUSCULAR | Status: AC
  Filled 2019-03-10: qty 4

## 2019-03-10 NOTE — Progress Notes (Signed)
PT Cancellation Note  Patient Details Name: JYLIAN PAPPALARDO MRN: [id number] DOB: [birth year]   Cancelled Treatment:    Reason Eval/Treat Not Completed: Patient at procedure or test/unavailable(at HD will follow)   Duncan Dull 03/10/2019, 7:41 AM

## 2019-03-10 NOTE — Progress Notes (Signed)
Patient ID: [name], female   DOB: 30-Apr-1930, 85 y.o.   MRN: [id number] S: Feels well and tolerating dialysis without complaints O:BP (!) 144/57   Pulse 72   Temp 97.9 F (36.6 C) (Oral)   Resp 12   Ht 5' 5"  (1.651 m)   Wt 83.1 kg   SpO2 99%   BMI 30.49 kg/m   Intake/Output Summary (Last 24 hours) at 03/10/2019 0829 Last data filed at 03/10/2019 0600 Gross per 24 hour  Intake 1200 ml  Output 0 ml  Net 1200 ml   Intake/Output: I/O last 3 completed shifts: In: 1260 [P.O.:1260] Out: 0   Intake/Output this shift:  No intake/output data recorded. Weight change: -0.1 kg Gen:NAD CVS: no rub Resp: cta MPN:TIRWER Ext: no edema  Recent Labs  Lab 03/04/19 0310 03/05/19 0251 03/06/19 0440 03/07/19 0646 03/08/19 0412 03/09/19 0735 03/10/19 0512  NA 134* 134* 132* 132* 136 136 134*  K 4.2 3.7 3.6 3.9 4.1 3.8 3.9  CL 102 103 100 101 104 100 100  CO2 21* 22 20* 20* 22 23 22   GLUCOSE 123* 94 89 79 92 85 96  BUN 83* 92* 87* 90* 47* 45* 42*  CREATININE 4.54* 4.77* 5.12* 5.37* 3.52* 3.95* 4.12*  ALBUMIN 2.5* 2.1* 1.9* 1.8* 1.8* 1.8* 1.9*  CALCIUM 9.0 8.5* 8.3* 8.2* 8.0* 8.2* 8.4*  PHOS 3.2 3.3 RESULTS UNAVAILABLE DUE TO INTERFERING SUBSTANCE >30.0* >30.0* >30.0* >30.0*   Liver Function Tests: Recent Labs  Lab 03/08/19 0412 03/09/19 0735 03/10/19 0512  ALBUMIN 1.8* 1.8* 1.9*   No results for input(s): LIPASE, AMYLASE in the last 168 hours. No results for input(s): AMMONIA in the last 168 hours. CBC: Recent Labs  Lab 03/04/19 0310  03/06/19 0440 03/07/19 0646 03/08/19 0412 03/09/19 0735 03/10/19 0512  WBC 6.7   < > 7.1 6.7 6.1 5.1 4.9  NEUTROABS 5.0  --   --   --  3.8  --   --   HGB 7.9*   < > 8.3* 7.8* 7.8* 7.5* 7.8*  HCT 24.9*   < > 25.7* 23.7* 23.6* 23.2* 23.8*  MCV 89.6   < > 86.5 85.6 87.1 88.5 88.8  PLT 177   < > 194 168 157 146* 131*   < > = values in this interval not displayed.   Cardiac Enzymes: No results for input(s): CKTOTAL, CKMB,  CKMBINDEX, TROPONINI in the last 168 hours. CBG: Recent Labs  Lab 03/09/19 1125 03/09/19 1615 03/09/19 2059 03/10/19 0412 03/10/19 0633  GLUCAP 109* 98 103* 72 91    Iron Studies:  Recent Labs    03/07/19 1052  IRON 60  TIBC 174*   Studies/Results: Vas [place] Upper Extremity Arterial Duplex  Result Date: 03/08/2019 UPPER EXTREMITY DUPLEX STUDY Indications: Pre operative for dialysis access.  Comparison Study: No prior study Performing Technologist: Sharion Dove RVS  Examination Guidelines: A complete evaluation includes B-mode imaging, spectral Doppler, color Doppler, and power Doppler as needed of all accessible portions of each vessel. Bilateral testing is considered an integral part of a complete examination. Limited examinations for reoccurring indications may be performed as noted.  Right Doppler Findings: +--------+----------+---------+-----------+--------+ Site    PSV (cm/s)Waveform Plaque     Comments +--------+----------+---------+-----------+--------+ [id number]       triphasicno stenosis         +--------+----------+---------+-----------+--------+ Radial  83        triphasicno stenosis         +--------+----------+---------+-----------+--------+ Ulnar   68  triphasicno stenosistortuous +--------+----------+---------+-----------+--------+ Right Pre-Dialysis Findings: +-----------------------+----------+--------------------+---------+--------+ Location               PSV (cm/s)Intralum. Diam. (cm)Waveform Comments +-----------------------+----------+--------------------+---------+--------+ Brachial Antecub. fossa          0.60                triphasic         +-----------------------+----------+--------------------+---------+--------+ Radial Art at Wrist              0.33                triphasic         +-----------------------+----------+--------------------+---------+--------+ Ulnar Art at Wrist               0.32                 triphasic         +-----------------------+----------+--------------------+---------+--------+ Left Doppler Findings: +--------+----------+---------+-----------+--------+ Site    PSV (cm/s)Waveform Plaque     Comments +--------+----------+---------+-----------+--------+ [id number]        triphasicno stenosis         +--------+----------+---------+-----------+--------+ Radial  182       triphasicno stenosis         +--------+----------+---------+-----------+--------+ Ulnar   133       triphasicno stenosis         +--------+----------+---------+-----------+--------+ Left Pre-Dialysis Findings: +-----------------------+----------+--------------------+---------+--------+ Location               PSV (cm/s)Intralum. Diam. (cm)Waveform Comments +-----------------------+----------+--------------------+---------+--------+ Brachial Antecub. fossa          0.65                triphasic         +-----------------------+----------+--------------------+---------+--------+ Radial Art at Wrist              0.33                triphasic         +-----------------------+----------+--------------------+---------+--------+ Ulnar Art at Wrist               0.22                triphasic         +-----------------------+----------+--------------------+---------+--------+  Summary:  Right: No obstruction visualized in the right upper extremity. Left: No obstruction visualized in the left upper extremity. *See table(s) above for measurements and observations. Electronically signed by [name] MD on 03/08/2019 at 5:49:17 PM.    Final    Vas [place] Upper Ext Vein Mapping (pre-op Avf)  Result Date: 03/08/2019 UPPER EXTREMITY VEIN MAPPING  Indications: Pre-dialysis access. Limitations: Bandages, IV lines Comparison Study: No prior study on file for comparison Performing Technologist: Sharion Dove RVS  Examination Guidelines: A complete evaluation includes B-mode imaging, spectral  Doppler, color Doppler, and power Doppler as needed of all accessible portions of each vessel. Bilateral testing is considered an integral part of a complete examination. Limited examinations for reoccurring indications may be performed as noted. +-----------------+-------------+----------+---------+ Right Cephalic   Diameter (cm)Depth (cm)Findings  +-----------------+-------------+----------+---------+ Prox upper arm       0.50        0.44             +-----------------+-------------+----------+---------+ Mid upper arm        0.58        0.48             +-----------------+-------------+----------+---------+ Dist upper arm       0.60  0.46             +-----------------+-------------+----------+---------+ Antecubital fossa    0.70        0.42             +-----------------+-------------+----------+---------+ Prox forearm         0.42        0.36   branching +-----------------+-------------+----------+---------+ Mid forearm          0.36        0.55   branching +-----------------+-------------+----------+---------+ Wrist                0.29        0.81             +-----------------+-------------+----------+---------+ +--------------+-------------+----------+---------+ Right Basilic Diameter (cm)Depth (cm)Findings  +--------------+-------------+----------+---------+ Dist upper arm                       branching +--------------+-------------+----------+---------+ +-----------------+-------------+----------+--------------+ Left Cephalic    Diameter (cm)Depth (cm)   Findings    +-----------------+-------------+----------+--------------+ Prox upper arm                          not visualized +-----------------+-------------+----------+--------------+ Mid upper arm        0.41        0.66                  +-----------------+-------------+----------+--------------+ Dist upper arm       0.37        0.59     branching     +-----------------+-------------+----------+--------------+ Antecubital fossa    0.53        0.67                  +-----------------+-------------+----------+--------------+ Prox forearm         0.21        0.24                  +-----------------+-------------+----------+--------------+ Mid forearm          0.12        0.29                  +-----------------+-------------+----------+--------------+ Wrist                0.19        0.33                  +-----------------+-------------+----------+--------------+ +-----------------+-------------+----------+----------------------------+ Left Basilic     Diameter (cm)Depth (cm)          Findings           +-----------------+-------------+----------+----------------------------+ Prox upper arm                                 not visualized        +-----------------+-------------+----------+----------------------------+ Mid upper arm        0.49        2.24              origin            +-----------------+-------------+----------+----------------------------+ Dist upper arm       0.52        2.34            branching           +-----------------+-------------+----------+----------------------------+ Antecubital fossa    0.30        0.96                                +-----------------+-------------+----------+----------------------------+  Prox forearm                                   not visualized        +-----------------+-------------+----------+----------------------------+ Mid forearm                             branching and not visualized +-----------------+-------------+----------+----------------------------+ Wrist                                          not visualized        +-----------------+-------------+----------+----------------------------+ *See table(s) above for measurements and observations.  Diagnosing physician: [name] MD Electronically signed by [name] MD on  03/08/2019 at 5:50:00 PM.    Final    . allopurinol  100 mg Oral Daily  . Chlorhexidine Gluconate Cloth  6 each Topical Daily  . Chlorhexidine Gluconate Cloth  6 each Topical Q0600  . cholecalciferol  2,000 Units Oral Daily  . dorzolamide  1 drop Left Eye QPM  . fluorometholone  1 drop Right Eye TID  . insulin aspart  0-9 Units Subcutaneous TID WC  . isosorbide-hydrALAZINE  1 tablet Oral BID  . latanoprost  1 drop Left Eye QHS  . mouth rinse  15 mL Mouth Rinse BID  . pantoprazole  40 mg Oral Daily  . polyethylene glycol  17 g Oral Daily  . pregabalin  50 mg Oral Daily  . senna-docusate  2 tablet Oral BID  . sevelamer carbonate  1,200 mg Oral TID WC    BMET    Component Value Date/Time   NA 134 (L) 03/10/2019 0512   K 3.9 03/10/2019 0512   CL 100 03/10/2019 0512   CO2 22 03/10/2019 0512   GLUCOSE 96 03/10/2019 0512   BUN 42 (H) 03/10/2019 0512   CREATININE 4.12 (H) 03/10/2019 0512   CREATININE 1.03 (H) 05/11/2016 0931   CALCIUM 8.4 (L) 03/10/2019 0512   GFRNONAA 9 (L) 03/10/2019 0512   GFRAA 11 (L) 03/10/2019 0512   CBC    Component Value Date/Time   WBC 4.9 03/10/2019 0512   RBC 2.68 (L) 03/10/2019 0512   HGB 7.8 (L) 03/10/2019 0512   HCT 23.8 (L) 03/10/2019 0512   PLT 131 (L) 03/10/2019 0512   MCV 88.8 03/10/2019 0512   MCH 29.1 03/10/2019 0512   MCHC 32.8 03/10/2019 0512   RDW 16.4 (H) 03/10/2019 0512   LYMPHSABS 1.5 03/08/2019 0412   MONOABS 0.6 03/08/2019 0412   EOSABS 0.2 03/08/2019 0412   BASOSABS 0.0 03/08/2019 0412     Assessment/Plan:  1. AKI/CKD stage 4 (unclear baseline as no labs for 3 years prior to admission).  Has been requiring HD and was seen today with second treatment and tolerating it well.  Awaiting outpatient HD arrangements  2. Multiple myeloma- receiving velcade/dexamethasone. Oncology following 3. Vascular access- has RIJ TDC per IR and is to have R AVF creation tomorrow per Dr. Scot Dock. 4. Anemia- due to malignancy and CKD.  For ESA  and follow 5. HTN- stable 6. SHPTH- did have elevated calcium due to MM improved with treatment. 7. Disposition- awaiting outpatient placement for HD.   Donetta Potts, MD [employer] [phone]

## 2019-03-10 NOTE — Progress Notes (Signed)
Physical Therapy Treatment Patient Details Name: [name] MRN: [id number] DOB: [birth year] Today's Date: 03/10/2019    History of Present Illness 85 yo female admitted with AKI, new diagnosis of multiple myeloma, pulmonary edema. Hx of bil knee surgeries, OA, DM gout neuropathy, SSS, pacemaker anemia    PT Comments    Patient seen for activity progression. Limited overall by fatigue and generalized weakness but mobilizing fairly well with use of RW. Current POC remains appropriate.   Follow Up Recommendations  Home health PT;Supervision for mobility/OOB(home health aide)     Equipment Recommendations  (TBD)    Recommendations for Other Services       Precautions / Restrictions Precautions Precautions: Fall Restrictions Weight Bearing Restrictions: No    Mobility  Bed Mobility Overal bed mobility: Needs Assistance Bed Mobility: Supine to Sit;Sit to Supine     Supine to sit: Supervision Sit to supine: Supervision   General bed mobility comments: increased time and effort, no physical assist required  Transfers Overall transfer level: Needs assistance Equipment used: Rolling walker (2 wheeled) Transfers: Sit to/from Stand Sit to Stand: Supervision         General transfer comment: Supervision for safety during transition to upright  Ambulation/Gait Ambulation/Gait assistance: Supervision Gait Distance (Feet): 140 Feet Assistive device: Rolling walker (2 wheeled) Gait Pattern/deviations: Step-to pattern Gait velocity: decreased Gait velocity interpretation: <1.31 ft/sec, indicative of household ambulator General Gait Details: slow gait, modest instability, reliance on RW for support and one standing rest break   Stairs             Wheelchair Mobility    Modified Rankin (Stroke Patients Only)       Balance Overall balance assessment: Needs assistance   Sitting balance-Leahy Scale: Fair     Standing balance support: Bilateral upper  extremity supported Standing balance-Leahy Scale: Poor Standing balance comment: reliance on UE support                            Cognition Arousal/Alertness: Awake/alert Behavior During Therapy: WFL for tasks assessed/performed Overall Cognitive Status: Within Functional Limits for tasks assessed                                        Exercises      General Comments        Pertinent Vitals/Pain Pain Assessment: Faces Faces Pain Scale: Hurts even more Pain Location: back, bil knees Pain Descriptors / Indicators: Aching;Sore Pain Intervention(s): Monitored during session;Repositioned    Home Living                      Prior Function            PT Goals (current goals can now be found in the care plan section) Acute Rehab PT Goals Patient Stated Goal: get stronger PT Goal Formulation: With patient Time For Goal Achievement: 03/17/19 Potential to Achieve Goals: Fair Progress towards PT goals: Progressing toward goals    Frequency    Min 3X/week      PT Plan Current plan remains appropriate    Co-evaluation              AM-PAC PT "6 Clicks" Mobility   Outcome Measure  Help needed turning from your back to your side while in a flat bed without using bedrails?: A Little Help  needed moving from lying on your back to sitting on the side of a flat bed without using bedrails?: A Little Help needed moving to and from a bed to a chair (including a wheelchair)?: A Little Help needed standing up from a chair using your arms (e.g., wheelchair or bedside chair)?: A Little Help needed to walk in hospital room?: A Little Help needed climbing 3-5 steps with a railing? : A Little 6 Click Score: 18    End of Session Equipment Utilized During Treatment: Gait belt Activity Tolerance: Patient tolerated treatment well Patient left: in chair;with call bell/phone within reach Nurse Communication: Mobility status PT Visit Diagnosis:  Muscle weakness (generalized) (M62.81);Unsteadiness on feet (R26.81)     Time: 0998-3382 PT Time Calculation (min) (ACUTE ONLY): 18 min  Charges:  $Gait Training: 8-22 mins                     Alben Deeds, PT DPT  Board Certified Neurologic Specialist [place] Pager [phone] Office [phone]    [name] 03/10/2019, 3:49 PM

## 2019-03-10 NOTE — Care Management Important Message (Signed)
Important Message  Patient Details  Name: [name] MRN: [id number] Date of Birth: May 29, 1930   Medicare Important Message Given:  Yes    [place] Memmer [address] 03/10/2019, 3:29 PM

## 2019-03-10 NOTE — Progress Notes (Addendum)
[facility] PROGRESS NOTE  [name] ZMC:[id number] DOB: 1930/09/28 DOA: 02/25/2019 PCP: Lucianne Lei, MD   Narrative: 26 AA ? II diabetic OSA on CPAP sick sinus syndrome status post PPM HTN presented to emergency room 02/25/2019 abnormal labs Noted decreased U OP + constipation-significant history for Motrin Tylenol tramadol-also noted to be on HCTZ at home BUN/creatinine 50/4.4 on admit UA straw-colored 30 of protein hypertensive 150s over 60s Nephrology input = multifactorial prerenal insults-nonoliguric-work-up started and eventually revealed elevated M spike light chains anemia hypercalcemia prompting oncology input Oncological work-up performed-started on dexamethasone short-term corticosteroids rasburicase etc. Cardiology consulted 5/16 for pulmonary edema secondary to volume overload-placed on IV nitroglycerin  Patient was eventually transferred to [facility] 5/21  Received HD 5/22 Vasc planning perm Access placement 5/26  She has been stable for the past several days and if all goes well we intend to discharge her when she is clipped   A & Plan ESRD-to be clipped Hyponatremia/Hypokalemia--currently resolved Phos >30--mediated by likely myeloma, not dietary Multifactorial-myeloma + prior insults over baseline of diabetic neuropathy  dialysis planning started Start renvela 1200, holding vit D/Calcium -appreciate nephrology Multiple myeloma-IgG type Malignant Hypercalcemia--resolved,  significant hyperphosphatemia secondary to myeloma 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/ESRD-baseline within the 7-8 range Slightly worsening thrombocytopenia-monitor carefully M spike on admit-Skel survery shows lucencies--Received Dexamethasone/ Bortezomib  transfused x 1 on 5/17, IV iron 5/15 Iron/Arenesp as per Renal HTN-complication of hypotension 5/22 Ef 60% and euvolemic doesn't have HF Cut back Bididl to bid 5/24-amlodipine stoppd 5/22 Suspect will need less and less blood pressure  meds PRN hydralazine 10 mg IV every 6 as needed for pressure over 160 Diabetes + neuropathy nephropathy Will not cover her blood sugar at this time given hypoglycemia in a setting of poor appetite CBG 70-100 Gout/tumor lysis stable at this time I cut back allopurinol from 100--> 50 daily given suppressive effects on hematopoiesis Can increase if as needed and I will get a uric acid in the morning Body mass index is 30.49 kg/m. Paradoxically has an albumin 1.9, adding Nepro 3 times daily  DVT [place]  Code Status: FULL  Communication: called daughter and updated 5/24-updated patient alone on 5/25 Disposition Plan: Await clip-renal navigators to coordinate the same   Verlon Au, MD  [facility] [place] [email or web address] 7PM-7AM contact night coverage as above 03/10/2019, 10:00 AM  LOS: 12 days   Consultants:  Surgery  Nephrology  Vascular  Procedures:   Bone marrow biopsy 5/19  Renal US 5/12  Bone survey 5/18  Antimicrobials:   None   Interval history/Subjective:  Seen on HD unit Sleepy Did not sleep overnight well No other overt complaints No fever no chills No blurred or double vision Objective:  Vitals:  Vitals:   03/10/19 0930 03/10/19 0945  BP: (!) 124/52 (!) 136/58  Pulse: 72 73  Resp: 12 17  Temp:    SpO2:      Exam:  Awake pleasant no distress Chest clinically clear, port in right upper chest s1 s2 slight murmur HSM abd soft nt dn no rebound no gaurd Neuro intact interactive Trace LE edema No asterixis  I have personally reviewed the following:  DATA Labs:  Phos >30,   Hb 7.8, platelets dropped to about 130 from 180  Scheduled Meds: . heparin      . allopurinol  50 mg Oral Daily  . Chlorhexidine Gluconate Cloth  6 each Topical Daily  . Chlorhexidine Gluconate Cloth  6 each Topical Q0600  .  cholecalciferol  2,000 Units Oral Daily  . dorzolamide  1 drop Left Eye QPM  . fluorometholone  1 drop Right Eye TID  .  insulin aspart  0-9 Units Subcutaneous TID WC  . isosorbide-hydrALAZINE  1 tablet Oral BID  . latanoprost  1 drop Left Eye QHS  . mouth rinse  15 mL Mouth Rinse BID  . pantoprazole  40 mg Oral Daily  . polyethylene glycol  17 g Oral Daily  . pregabalin  50 mg Oral Daily  . senna-docusate  2 tablet Oral BID  . sevelamer carbonate  1,200 mg Oral TID WC   Continuous Infusions: . sodium chloride 10 mL/hr at 03/07/19 2216  . [START ON 03/11/2019] vancomycin      Active Problems:   AKI (acute kidney injury) ([place])   Hypokalemia   LOS: 12 days

## 2019-03-10 NOTE — H&P (View-Only) (Signed)
   VASCULAR SURGERY ASSESSMENT & PLAN:   ESRD: I have reviewed her arterial duplex and vein mapping.  She appears to be a candidate for a fistula in the right arm.  This has been scheduled for tomorrow.  She has a functioning dialysis catheter and is currently on dialysis.  I have again explained the procedure to her and all of her questions have been answered. She is agreeable to proceed.  I have written her preop orders.  SUBJECTIVE:   No complaints.  PHYSICAL EXAM:   Vitals:   03/10/19 0508 03/10/19 0709 03/10/19 0714 03/10/19 0730  BP: (!) 126/47 (!) 142/59 (!) 146/61 (!) 154/64  Pulse: 71 70 70 69  Resp: 17 15 16 16   Temp: 97.9 F (36.6 C) 97.9 F (36.6 C)    TempSrc:  Oral    SpO2: 99%     Weight:  83.1 kg    Height:       Palpable right radial pulse. Catheter is working well this morning.  LABS:   Lab Results  Component Value Date   WBC 4.9 03/10/2019   HGB 7.8 (L) 03/10/2019   HCT 23.8 (L) 03/10/2019   MCV 88.8 03/10/2019   PLT 131 (L) 03/10/2019   Lab Results  Component Value Date   CREATININE 4.12 (H) 03/10/2019   Lab Results  Component Value Date   INR 1.57 (H) 01/03/2010   CBG (last 3)  Recent Labs    03/09/19 2059 03/10/19 0412 03/10/19 0633  GLUCAP 103* 72 91    PROBLEM LIST:    Active Problems:   AKI (acute kidney injury) ([place])   Hypokalemia   CURRENT MEDS:   . allopurinol  100 mg Oral Daily  . Chlorhexidine Gluconate Cloth  6 each Topical Daily  . Chlorhexidine Gluconate Cloth  6 each Topical Q0600  . cholecalciferol  2,000 Units Oral Daily  . dorzolamide  1 drop Left Eye QPM  . fluorometholone  1 drop Right Eye TID  . insulin aspart  0-9 Units Subcutaneous TID WC  . isosorbide-hydrALAZINE  1 tablet Oral BID  . latanoprost  1 drop Left Eye QHS  . mouth rinse  15 mL Mouth Rinse BID  . pantoprazole  40 mg Oral Daily  . polyethylene glycol  17 g Oral Daily  . pregabalin  50 mg Oral Daily  . senna-docusate  2 tablet Oral BID   . sevelamer carbonate  1,200 mg Oral TID [facility]    [name] Beeper: [phone] Office: [phone] 03/10/2019

## 2019-03-10 NOTE — Progress Notes (Signed)
   VASCULAR SURGERY ASSESSMENT & PLAN:   ESRD: I have reviewed her arterial duplex and vein mapping.  She appears to be a candidate for a fistula in the right arm.  This has been scheduled for tomorrow.  She has a functioning dialysis catheter and is currently on dialysis.  I have again explained the procedure to her and all of her questions have been answered. She is agreeable to proceed.  I have written her preop orders.  SUBJECTIVE:   No complaints.  PHYSICAL EXAM:   Vitals:   03/10/19 0508 03/10/19 0709 03/10/19 0714 03/10/19 0730  BP: (!) 126/47 (!) 142/59 (!) 146/61 (!) 154/64  Pulse: 71 70 70 69  Resp: 17 15 16 16   Temp: 97.9 F (36.6 C) 97.9 F (36.6 C)    TempSrc:  Oral    SpO2: 99%     Weight:  83.1 kg    Height:       Palpable right radial pulse. Catheter is working well this morning.  LABS:   Lab Results  Component Value Date   WBC 4.9 03/10/2019   HGB 7.8 (L) 03/10/2019   HCT 23.8 (L) 03/10/2019   MCV 88.8 03/10/2019   PLT 131 (L) 03/10/2019   Lab Results  Component Value Date   CREATININE 4.12 (H) 03/10/2019   Lab Results  Component Value Date   INR 1.57 (H) 01/03/2010   CBG (last 3)  Recent Labs    03/09/19 2059 03/10/19 0412 03/10/19 0633  GLUCAP 103* 72 91    PROBLEM LIST:    Active Problems:   AKI (acute kidney injury) ([place])   Hypokalemia   CURRENT MEDS:   . allopurinol  100 mg Oral Daily  . Chlorhexidine Gluconate Cloth  6 each Topical Daily  . Chlorhexidine Gluconate Cloth  6 each Topical Q0600  . cholecalciferol  2,000 Units Oral Daily  . dorzolamide  1 drop Left Eye QPM  . fluorometholone  1 drop Right Eye TID  . insulin aspart  0-9 Units Subcutaneous TID WC  . isosorbide-hydrALAZINE  1 tablet Oral BID  . latanoprost  1 drop Left Eye QHS  . mouth rinse  15 mL Mouth Rinse BID  . pantoprazole  40 mg Oral Daily  . polyethylene glycol  17 g Oral Daily  . pregabalin  50 mg Oral Daily  . senna-docusate  2 tablet Oral BID   . sevelamer carbonate  1,200 mg Oral TID [facility]    [name] Beeper: [phone] Office: [phone] 03/10/2019

## 2019-03-11 ENCOUNTER — Encounter (HOSPITAL_COMMUNITY): Payer: Self-pay | Admitting: Anesthesiology

## 2019-03-11 ENCOUNTER — Encounter (HOSPITAL_COMMUNITY)
Admission: EM | Disposition: A | Discharge status: Home Health | Payer: Self-pay | Source: Home / Self Care | Attending: Family Medicine

## 2019-03-11 ENCOUNTER — Inpatient Hospital Stay (HOSPITAL_COMMUNITY): Payer: Medicare HMO | Admitting: Anesthesiology

## 2019-03-11 DIAGNOSIS — N2581 Secondary hyperparathyroidism of renal origin: Secondary | ICD-10-CM | POA: Insufficient documentation

## 2019-03-11 DIAGNOSIS — D631 Anemia in chronic kidney disease: Secondary | ICD-10-CM | POA: Insufficient documentation

## 2019-03-11 DIAGNOSIS — N189 Chronic kidney disease, unspecified: Secondary | ICD-10-CM | POA: Insufficient documentation

## 2019-03-11 DIAGNOSIS — D509 Iron deficiency anemia, unspecified: Secondary | ICD-10-CM | POA: Insufficient documentation

## 2019-03-11 DIAGNOSIS — N185 Chronic kidney disease, stage 5: Secondary | ICD-10-CM

## 2019-03-11 DIAGNOSIS — R001 Bradycardia, unspecified: Secondary | ICD-10-CM | POA: Insufficient documentation

## 2019-03-11 HISTORY — PX: AV FISTULA PLACEMENT: SHX1204

## 2019-03-11 HISTORY — PX: FISTULA SUPERFICIALIZATION: SHX6341

## 2019-03-11 LAB — RENAL FUNCTION PANEL
Albumin: 1.8 g/dL — ABNORMAL LOW (ref 3.5–5.0)
Anion gap: 6 (ref 5–15)
BUN: 22 mg/dL (ref 8–23)
CO2: 26 mmol/L (ref 22–32)
Calcium: 8.2 mg/dL — ABNORMAL LOW (ref 8.9–10.3)
Chloride: 105 mmol/L (ref 98–111)
Creatinine, Ser: 3.26 mg/dL — ABNORMAL HIGH (ref 0.44–1.00)
GFR calc Af Amer: 14 mL/min — ABNORMAL LOW (ref >60)
GFR calc non Af Amer: 12 mL/min — ABNORMAL LOW (ref >60)
Glucose, Bld: 85 mg/dL (ref 70–99)
Phosphorus: >30 mg/dL — ABNORMAL HIGH (ref 2.5–4.6)
Potassium: 4.1 mmol/L (ref 3.5–5.1)
Sodium: 137 mmol/L (ref 135–145)

## 2019-03-11 LAB — CBC WITH DIFFERENTIAL/PLATELET
Abs Immature Granulocytes: 0.03 10*3/uL (ref 0.00–0.07)
Basophils Absolute: 0 10*3/uL (ref 0.0–0.1)
Basophils Relative: 0 %
Eosinophils Absolute: 0.1 10*3/uL (ref 0.0–0.5)
Eosinophils Relative: 3 %
HCT: 22.9 % — ABNORMAL LOW (ref 36.0–46.0)
Hemoglobin: 7.3 g/dL — ABNORMAL LOW (ref 12.0–15.0)
Immature Granulocytes: 1 %
Lymphocytes Relative: 24 %
Lymphs Abs: 1.2 10*3/uL (ref 0.7–4.0)
MCH: 28.5 pg (ref 26.0–34.0)
MCHC: 31.9 g/dL (ref 30.0–36.0)
MCV: 89.5 fL (ref 80.0–100.0)
Monocytes Absolute: 0.5 10*3/uL (ref 0.1–1.0)
Monocytes Relative: 9 %
Neutro Abs: 3.3 10*3/uL (ref 1.7–7.7)
Neutrophils Relative %: 63 %
Platelets: 130 10*3/uL — ABNORMAL LOW (ref 150–400)
RBC: 2.56 MIL/uL — ABNORMAL LOW (ref 3.87–5.11)
RDW: 16.5 % — ABNORMAL HIGH (ref 11.5–15.5)
WBC: 5.2 10*3/uL (ref 4.0–10.5)
nRBC: 0 % (ref 0.0–0.2)

## 2019-03-11 LAB — SURGICAL PCR SCREEN
MRSA, PCR: NEGATIVE
Staphylococcus aureus: NEGATIVE

## 2019-03-11 LAB — GLUCOSE, CAPILLARY
Glucose-Capillary: 79 mg/dL (ref 70–99)
Glucose-Capillary: 79 mg/dL (ref 70–99)
Glucose-Capillary: 74 mg/dL (ref 70–99)

## 2019-03-11 LAB — URIC ACID: Uric Acid, Serum: 2 mg/dL — ABNORMAL LOW (ref 2.5–7.1)

## 2019-03-11 SURGERY — ARTERIOVENOUS (AV) FISTULA CREATION
Anesthesia: Monitor Anesthesia Care | Site: Arm Lower | Laterality: Right

## 2019-03-11 MED ORDER — ONDANSETRON HCL 4 MG/2ML IJ SOLN: 4.0000 mg | Freq: Once | INTRAMUSCULAR | Status: DC | PRN

## 2019-03-11 MED ORDER — OXYCODONE-ACETAMINOPHEN 5-325 MG PO TABS: 1.0000 | ORAL_TABLET | Freq: Four times a day (QID) | ORAL | Status: DC | PRN

## 2019-03-11 MED ORDER — LIDOCAINE HCL (PF) 1 % IJ SOLN
INTRAMUSCULAR | Status: AC
  Filled 2019-03-11: qty 30

## 2019-03-11 MED ORDER — SEVELAMER CARBONATE 2.4 G PO PACK: 2.4000 g | PACK | Freq: Three times a day (TID) | ORAL | 0 refills | Status: DC

## 2019-03-11 MED ORDER — 0.9 % SODIUM CHLORIDE (POUR BTL) OPTIME
TOPICAL | Status: DC | PRN
  Administered 2019-03-11: 1000 mL

## 2019-03-11 MED ORDER — PROTAMINE SULFATE 10 MG/ML IV SOLN
INTRAVENOUS | Status: DC | PRN
  Administered 2019-03-11 (×3): 10 mg via INTRAVENOUS

## 2019-03-11 MED ORDER — SODIUM CHLORIDE 0.9 % IV SOLN
INTRAVENOUS | Status: DC | PRN
  Administered 2019-03-11: 500 mL

## 2019-03-11 MED ORDER — FENTANYL CITRATE (PF) 100 MCG/2ML IJ SOLN
INTRAMUSCULAR | Status: DC | PRN
  Administered 2019-03-11 (×2): 25 ug via INTRAVENOUS

## 2019-03-11 MED ORDER — LIDOCAINE HCL (PF) 1 % IJ SOLN
INTRAMUSCULAR | Status: DC | PRN
  Administered 2019-03-11: 27 mL

## 2019-03-11 MED ORDER — SENNOSIDES-DOCUSATE SODIUM 8.6-50 MG PO TABS: 2.0000 | ORAL_TABLET | Freq: Two times a day (BID) | ORAL | 0 refills | Status: AC

## 2019-03-11 MED ORDER — FENTANYL CITRATE (PF) 100 MCG/2ML IJ SOLN: 25.0000 ug | INTRAMUSCULAR | Status: DC | PRN

## 2019-03-11 MED ORDER — PHENYLEPHRINE HCL (PRESSORS) 10 MG/ML IV SOLN
INTRAVENOUS | Status: DC | PRN
  Administered 2019-03-11 (×4): 80 ug via INTRAVENOUS

## 2019-03-11 MED ORDER — PROPOFOL 10 MG/ML IV BOLUS
INTRAVENOUS | Status: AC
  Filled 2019-03-11: qty 20

## 2019-03-11 MED ORDER — ALLOPURINOL 100 MG PO TABS: 50.0000 mg | ORAL_TABLET | Freq: Every day | ORAL | 0 refills | Status: AC

## 2019-03-11 MED ORDER — ISOSORB DINITRATE-HYDRALAZINE 20-37.5 MG PO TABS: 1.0000 | ORAL_TABLET | Freq: Two times a day (BID) | ORAL | 0 refills | Status: DC

## 2019-03-11 MED ORDER — PROTAMINE SULFATE 10 MG/ML IV SOLN
INTRAVENOUS | Status: AC
  Filled 2019-03-11: qty 5

## 2019-03-11 MED ORDER — SODIUM CHLORIDE 0.9 % IV SOLN
INTRAVENOUS | Status: AC
  Filled 2019-03-11: qty 1.2

## 2019-03-11 MED ORDER — LIDOCAINE HCL 1 % IJ SOLN
INTRAMUSCULAR | Status: DC | PRN
  Administered 2019-03-11: 25 mg via INTRADERMAL

## 2019-03-11 MED ORDER — FENTANYL CITRATE (PF) 250 MCG/5ML IJ SOLN
INTRAMUSCULAR | Status: AC
  Filled 2019-03-11: qty 5

## 2019-03-11 MED ORDER — SODIUM CHLORIDE 0.9 % IV SOLN
INTRAVENOUS | Status: DC | PRN
  Administered 2019-03-11: 07:00:00 via INTRAVENOUS

## 2019-03-11 MED ORDER — OXYCODONE-ACETAMINOPHEN 5-325 MG PO TABS: 1.0000 | ORAL_TABLET | Freq: Four times a day (QID) | ORAL | 0 refills | Status: DC | PRN

## 2019-03-11 MED ORDER — HEPARIN SODIUM (PORCINE) 1000 UNIT/ML IJ SOLN
INTRAMUSCULAR | Status: AC
  Filled 2019-03-11: qty 1

## 2019-03-11 MED ORDER — PROPOFOL 500 MG/50ML IV EMUL
INTRAVENOUS | Status: DC | PRN
  Administered 2019-03-11: 50 ug/kg/min via INTRAVENOUS

## 2019-03-11 MED ORDER — LIDOCAINE-EPINEPHRINE (PF) 1 %-1:200000 IJ SOLN
INTRAMUSCULAR | Status: AC
  Filled 2019-03-11: qty 30

## 2019-03-11 MED ORDER — ONDANSETRON HCL 4 MG/2ML IJ SOLN
INTRAMUSCULAR | Status: DC | PRN
  Administered 2019-03-11: 4 mg via INTRAVENOUS

## 2019-03-11 SURGICAL SUPPLY — 61 items
ADH SKN CLS APL DERMABOND .7 (GAUZE/BANDAGES/DRESSINGS) ×2
ARMBAND PINK RESTRICT EXTREMIT (MISCELLANEOUS) ×5 IMPLANT
BAG DECANTER FOR FLEXI CONT (MISCELLANEOUS) ×3 IMPLANT
BIOPATCH RED 1 DISK 7.0 (GAUZE/BANDAGES/DRESSINGS) ×2 IMPLANT
CANISTER SUCT 3000ML PPV (MISCELLANEOUS) ×3 IMPLANT
CANNULA VESSEL 3MM 2 BLNT TIP (CANNULA) ×3 IMPLANT
CATH PALINDROME RT-P 15FX19CM (CATHETERS) IMPLANT
CATH PALINDROME RT-P 15FX23CM (CATHETERS) IMPLANT
CATH PALINDROME RT-P 15FX28CM (CATHETERS) IMPLANT
CATH PALINDROME RT-P 15FX55CM (CATHETERS) IMPLANT
CHLORAPREP W/TINT 26ML (MISCELLANEOUS) ×2 IMPLANT
CLIP VESOCCLUDE MED 6/CT (CLIP) ×3 IMPLANT
CLIP VESOCCLUDE SM WIDE 6/CT (CLIP) ×4 IMPLANT
COVER PROBE W GEL 5X96 (DRAPES) IMPLANT
COVER SURGICAL LIGHT HANDLE (MISCELLANEOUS) ×3 IMPLANT
COVER WAND RF STERILE (DRAPES) ×2 IMPLANT
DECANTER SPIKE VIAL GLASS SM (MISCELLANEOUS) ×3 IMPLANT
DERMABOND ADVANCED (GAUZE/BANDAGES/DRESSINGS) ×1
DERMABOND ADVANCED .7 DNX12 (GAUZE/BANDAGES/DRESSINGS) ×2 IMPLANT
DRAPE C-ARM 42X72 X-RAY (DRAPES) ×2 IMPLANT
DRAPE CHEST BREAST 15X10 FENES (DRAPES) ×2 IMPLANT
ELECT NDL TIP 2.8 STRL (NEEDLE) IMPLANT
ELECT NEEDLE TIP 2.8 STRL (NEEDLE) ×3 IMPLANT
ELECT REM PT RETURN 9FT ADLT (ELECTROSURGICAL) ×3
ELECTRODE REM PT RTRN 9FT ADLT (ELECTROSURGICAL) ×2 IMPLANT
GAUZE 4X4 16PLY RFD (DISPOSABLE) ×3 IMPLANT
GLOVE BIO SURGEON STRL SZ 6.5 (GLOVE) ×2 IMPLANT
GLOVE BIO SURGEON STRL SZ7.5 (GLOVE) ×3 IMPLANT
GLOVE BIOGEL PI IND STRL 6.5 (GLOVE) IMPLANT
GLOVE BIOGEL PI IND STRL 7.0 (GLOVE) IMPLANT
GLOVE BIOGEL PI IND STRL 8 (GLOVE) ×2 IMPLANT
GLOVE BIOGEL PI INDICATOR 6.5 (GLOVE) ×1
GLOVE BIOGEL PI INDICATOR 7.0 (GLOVE) ×2
GLOVE BIOGEL PI INDICATOR 8 (GLOVE) ×1
GOWN STRL REUS W/ TWL LRG LVL3 (GOWN DISPOSABLE) ×6 IMPLANT
GOWN STRL REUS W/TWL LRG LVL3 (GOWN DISPOSABLE) ×9
KIT BASIN OR (CUSTOM PROCEDURE TRAY) ×3 IMPLANT
KIT TURNOVER KIT B (KITS) ×3 IMPLANT
NDL 18GX1X1/2 (RX/OR ONLY) (NEEDLE) ×2 IMPLANT
NDL HYPO 25GX1X1/2 BEV (NEEDLE) ×2 IMPLANT
NEEDLE 18GX1X1/2 (RX/OR ONLY) (NEEDLE) IMPLANT
NEEDLE HYPO 25GX1X1/2 BEV (NEEDLE) ×3 IMPLANT
NS IRRIG 1000ML POUR BTL (IV SOLUTION) ×3 IMPLANT
PACK CV ACCESS (CUSTOM PROCEDURE TRAY) ×3 IMPLANT
PACK SURGICAL SETUP 50X90 (CUSTOM PROCEDURE TRAY) ×3 IMPLANT
PAD ARMBOARD 7.5X6 YLW CONV (MISCELLANEOUS) ×5 IMPLANT
SPONGE SURGIFOAM ABS GEL 100 (HEMOSTASIS) IMPLANT
SUT ETHILON 3 0 PS 1 (SUTURE) ×2 IMPLANT
SUT PROLENE 6 0 BV (SUTURE) ×3 IMPLANT
SUT VIC AB 3-0 SH 27 (SUTURE) ×3
SUT VIC AB 3-0 SH 27X BRD (SUTURE) ×2 IMPLANT
SUT VIC AB 4-0 PS2 18 (SUTURE) ×1 IMPLANT
SUT VICRYL 4-0 PS2 18IN ABS (SUTURE) ×3 IMPLANT
SYR 10ML LL (SYRINGE) ×2 IMPLANT
SYR 20CC LL (SYRINGE) ×4 IMPLANT
SYR 5ML LL (SYRINGE) ×4 IMPLANT
SYR CONTROL 10ML LL (SYRINGE) ×3 IMPLANT
TOWEL GREEN STERILE (TOWEL DISPOSABLE) ×6 IMPLANT
TOWEL GREEN STERILE FF (TOWEL DISPOSABLE) ×3 IMPLANT
UNDERPAD 30X30 (UNDERPADS AND DIAPERS) ×3 IMPLANT
WATER STERILE IRR 1000ML POUR (IV SOLUTION) ×3 IMPLANT

## 2019-03-11 NOTE — Discharge Summary (Signed)
Physician Discharge Summary  [name] TXM:[id number] DOB: Jan 04, 1930 DOA: 02/25/2019  PCP: Lucianne Lei, MD  Admit date: 02/25/2019 Discharge date: 03/11/2019  Time spent: 40 minutes  Recommendations for Outpatient Follow-up:  1. Needs outpatient continued follow-up with oncologist renal etc. 2. Recommend CBC, Chem-12, phosphorus, mag 1 week 3. Note medication changes specific to BiDil, allopurinol, initiation of Renvela high-dose,Discontinuation of amlodipine and HCTZ in addition to telmisartan-discontinue Carafate 4. First dialysis outpatient at new center on Thursday 5. Chemotherapy as per Dr. course that she is scheduled for Friday at  [facility]  Discharge Diagnoses:  Active Problems:   AKI (acute kidney injury) ([place])   Hypokalemia   Discharge Condition: Fair  Diet recommendation: Renal heart healthy  Filed Weights   03/10/19 0945 03/11/19 0435 03/11/19 0719  Weight: 81.5 kg 76 kg 76 kg    History of present illness:  33 AA ? II diabetic OSA on CPAP sick sinus syndrome status post PPM HTN presented to emergency room 02/25/2019 abnormal labs Noted decreased U OP + constipation-significant history for Motrin Tylenol tramadol-also noted to be on HCTZ at home BUN/creatinine 50/4.4 on admit UA straw-colored 30 of protein hypertensive 150s over 60s Nephrology input = multifactorial prerenal insults-nonoliguric-work-up started and eventually revealed elevated M spike light chains anemia hypercalcemia prompting oncology input Oncological work-up performed-started on dexamethasone short-term corticosteroids rasburicase etc. Cardiology consulted 5/16 for pulmonary edema secondary to volume overload-placed on IV nitroglycerin  Patient was eventually transferred to [facility] 5/21  Received HD 5/22 Vasc planning perm Access placement 5/26  She has been stable for the past several days and if all goes well we intend to discharge her when she is clipped  Hospital  Course:  ESRD-to be clipped Hyponatremia/Hypokalemia--currently resolved Phos >30--mediated by likely myeloma, not dietary Multifactorial-myeloma + prior insults over baseline of diabetic neuropathy  dialysis planning started Start renvela 1200, holding vit D/Calcium -appreciate nephrology Multiple myeloma-IgG type Malignant Hypercalcemia--resolved,  significant hyperphosphatemia secondary to myeloma 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/ESRD-baseline within the 7-8 range Slightly worsening thrombocytopenia-monitor carefully as outpatient M spike on admit-Skel survery shows lucencies--Received Dexamethasone/ Bortezomib  transfused x 1 on 5/17, IV iron 5/15 Iron/Arenesp as per Renal and will be followed by both renal and oncologist as an outpatient Patient is next scheduled for chemotherapy on 4/82 HTN-complication of hypotension 5/22 Ef 60% and euvolemic doesn't have HF Cut back Bididl to bid 5/24-amlodipine stopped 5/22 Suspect will need less and less blood pressure meds0 Diabetes + neuropathy nephropathy Will not cover her blood sugar at this time given hypoglycemia in a setting of poor appetite CBG 70-100 Resume hypoglycemic oral agents as an outpatient Gout/tumor lysis stable at this time I cut back allopurinol from 100--> 50 daily given suppressive effects on hematopoiesis Uric acid 2.0 on discharge Body mass index is 30.49 kg/m. Paradoxically has an albumin 1.9, adding Nepro 3 times daily  Procedures:  Dialysis fistula RUE 5/26   Consultations:  Renal   onc  Discharge Exam: Vitals:   03/11/19 0918 03/11/19 0928  BP: (!) 119/49 (!) 121/44  Pulse: 72 70  Resp: (!) 22 16  Temp:  (!) 97.3 F (36.3 C)  SpO2: 94% 94%    General: Awake pleasant alert no distress good spirits eating drinking at the bedside no new issues Cardiovascular: Chest clear S1-S2 no murmur rub or gallop telemetry benign Respiratory: Clear Lower extremities are soft and no edema no breakdown I did not  examine sacrum Fistula in right upper extremity looks well-healed  Discharge  Instructions   Discharge Instructions    Diet - low sodium heart healthy   Complete by:  As directed    Discharge instructions   Complete by:  As directed    You have been accepted to [facility] for OP HD treatment on a TTS schedule with a seat time of 11:40am. She needs to arrive at the clinic at 10:30am to sign paperwork on her first day of treatment.   Please make sure that you get labs as per what has been requested- noticed that we have cut back some your blood pressure meds and you will need a new med called Renvela in addition to some meds for constipation We have also changed doses of allopurinol We have discontinued your HCTZ and Carafate   Increase activity slowly   Complete by:  As directed      Allergies as of 03/11/2019      Reactions   Aspirin Other (See Comments)   REACTION: STOMACH ISSUES WITH DOSE HIGHER THAN 81 MG    Penicillins Rash   Patient took injection and tablets, had a reaction. She has taken amoxicillin with no reaction Has patient had a PCN reaction causing immediate rash, facial/tongue/throat swelling, SOB or lightheadedness with hypotension: Yes Has patient had a PCN reaction causing severe rash involving mucus membranes or skin necrosis: Yes Has patient had a PCN reaction that required hospitalization No Has patient had a PCN reaction occurring within the last 10 years: No If all of the above answers are "NO", then m   Brimonidine Itching   Diltiazem Hcl Rash   Hydrocodone-acetaminophen Nausea And Vomiting   Morphine Nausea And Vomiting   Neurontin [gabapentin] Nausea And Vomiting      Medication List    STOP taking these medications   amLODipine 10 MG tablet Commonly known as:  NORVASC   hydrochlorothiazide 12.5 MG capsule Commonly known as:  MICROZIDE   Narcan 4 MG/0.1ML Liqd nasal spray kit Generic drug:  naloxone   sucralfate 1  GM/10ML suspension Commonly known as:  CARAFATE   telmisartan 80 MG tablet Commonly known as:  MICARDIS   traMADol 50 MG tablet Commonly known as:  ULTRAM     TAKE these medications   allopurinol 100 MG tablet Commonly known as:  ZYLOPRIM Take 0.5 tablets (50 mg total) by mouth daily. Start taking on:  Mar 12, 2019 What changed:  how much to take   CO Q 10 PO Take 1 tablet by mouth daily.   dorzolamide 2 % ophthalmic solution Commonly known as:  TRUSOPT Place 1 drop into the left eye every evening.   fluorometholone 0.1 % ophthalmic suspension Commonly known as:  FML Place 1 drop into the right eye 3 (three) times daily.   glimepiride 1 MG tablet Commonly known as:  AMARYL Take 0.5 mg by mouth 2 (two) times daily.   isosorbide-hydrALAZINE 20-37.5 MG tablet Commonly known as:  BIDIL Take 1 tablet by mouth 2 (two) times a day.   Lumigan 0.01 % Soln Generic drug:  bimatoprost Place 1 drop into the left eye every evening.   Lyrica 50 MG capsule Generic drug:  pregabalin Take 50 mg by mouth daily.   multivitamin with minerals Tabs tablet Take 1 tablet by mouth daily.   oxyCODONE-acetaminophen 5-325 MG tablet Commonly known as:  PERCOCET/ROXICET Take 1 tablet by mouth every 6 (six) hours as needed for moderate pain.   pantoprazole 40 MG tablet Commonly known as:  PROTONIX Take 40  mg by mouth daily.   polyethylene glycol 17 g packet Commonly known as:  MIRALAX / GLYCOLAX Take 5.6 g by mouth at bedtime.   senna-docusate 8.6-50 MG tablet Commonly known as:  Senokot-S Take 2 tablets by mouth 2 (two) times daily.   sevelamer carbonate 2.4 g Pack Commonly known as:  Renvela Take 2.4 g by mouth 3 (three) times daily with meals.   sodium chloride 0.65 % Soln nasal spray Commonly known as:  OCEAN Place 1 spray into both nostrils as needed for congestion.      Allergies  Allergen Reactions  . Aspirin Other (See Comments)    REACTION: STOMACH ISSUES WITH  DOSE HIGHER THAN 81 MG   . Penicillins Rash    Patient took injection and tablets, had a reaction. She has taken amoxicillin with no reaction Has patient had a PCN reaction causing immediate rash, facial/tongue/throat swelling, SOB or lightheadedness with hypotension: Yes Has patient had a PCN reaction causing severe rash involving mucus membranes or skin necrosis: Yes Has patient had a PCN reaction that required hospitalization No Has patient had a PCN reaction occurring within the last 10 years: No If all of the above answers are "NO", then m  . Brimonidine Itching  . Diltiazem Hcl Rash  . Hydrocodone-Acetaminophen Nausea And Vomiting  . Morphine Nausea And Vomiting  . Neurontin [Gabapentin] Nausea And Vomiting   Follow-up Information    [facility] In 6 weeks.   Specialty:  Vascular Surgery Why:  Office will call you to arrange your appt (sent) Contact information: [address] [place] [phone]           The results of significant diagnostics from this hospitalization (including imaging, microbiology, ancillary and laboratory) are listed below for reference.    Significant Diagnostic Studies:  Microbiology: Recent Results (from the past 240 hour(s))  Surgical pcr screen     Status: None   Collection Time: 03/11/19  5:15 AM  Result Value Ref Range Status   MRSA, PCR NEGATIVE NEGATIVE Final   Staphylococcus aureus NEGATIVE NEGATIVE Final    Comment: (NOTE) The Xpert SA Assay (FDA approved for NASAL specimens in patients 41 years of age and older), is one component of a comprehensive surveillance program. It is not intended to diagnose infection nor to guide or monitor treatment. Performed at [facility], [place] [address], [place], [place] [address]      Labs: Basic Metabolic Panel: Recent Labs  Lab 03/07/19 [phone] 03/08/19 0412 03/09/19 0735 03/10/19 0512 03/11/19 0457  NA 132* 136 136 134* 137  K  3.9 4.1 3.8 3.9 4.1  CL 101 104 100 100 105  CO2 20* 22 23 22 26   GLUCOSE 79 92 85 96 85  BUN 90* 47* 45* 42* 22  CREATININE 5.37* 3.52* 3.95* 4.12* 3.26*  CALCIUM 8.2* 8.0* 8.2* 8.4* 8.2*  PHOS >30.0* >30.0* >30.0* >30.0* >30.0*   Liver Function Tests: Recent Labs  Lab 03/07/19 0646 03/08/19 0412 03/09/19 0735 03/10/19 0512 03/11/19 0457  ALBUMIN 1.8* 1.8* 1.8* 1.9* 1.8*   No results for input(s): LIPASE, AMYLASE in the last 168 hours. No results for input(s): AMMONIA in the last 168 hours. CBC: Recent Labs  Lab 03/07/19 0646 03/08/19 0412 03/09/19 0735 03/10/19 0512 03/11/19 0457  WBC 6.7 6.1 5.1 4.9 5.2  NEUTROABS  --  3.8  --   --  3.3  HGB 7.8* 7.8* 7.5* 7.8* 7.3*  HCT 23.7* 23.6* 23.2* 23.8*  22.9*  MCV 85.6 87.1 88.5 88.8 89.5  PLT 168 157 146* 131* 130*   Cardiac Enzymes: No results for input(s): CKTOTAL, CKMB, CKMBINDEX, TROPONINI in the last 168 hours. BNP: BNP (last 3 results) No results for input(s): BNP in the last 8760 hours.  ProBNP (last 3 results) No results for input(s): PROBNP in the last 8760 hours.  CBG: Recent Labs  Lab 03/10/19 1207 03/10/19 1626 03/11/19 0532 03/11/19 0905 03/11/19 1009  GLUCAP 96 92 79 79 74       Signed:  Nita Sells MD   [facility] 03/11/2019, 1:46 PM

## 2019-03-11 NOTE — Anesthesia Postprocedure Evaluation (Signed)
Anesthesia Post Note  Patient: [name]  Procedure(s) Performed: CREATION RIGHT ARM RADIOCEPHALIC ARTERIOVENOUS FISTULA (Right Arm Lower) FISTULA SUPERFICIALIZATION (Right Arm Lower)     Patient location during evaluation: PACU Anesthesia Type: MAC Level of consciousness: awake Pain management: pain level controlled Vital Signs Assessment: post-procedure vital signs reviewed and stable Respiratory status: spontaneous breathing, nonlabored ventilation, respiratory function stable and patient connected to nasal cannula oxygen Cardiovascular status: stable and blood pressure returned to baseline Postop Assessment: no apparent nausea or vomiting Anesthetic complications: no    Last Vitals:  Vitals:   03/11/19 0918 03/11/19 0928  BP: (!) 119/49 (!) 121/44  Pulse: 72 70  Resp: (!) 22 16  Temp:  (!) 36.3 C  SpO2: 94% 94%    Last Pain:  Vitals:   03/11/19 0928  TempSrc:   PainSc: 0-No pain                 Thalia Turkington P Katlynne Mckercher

## 2019-03-11 NOTE — Anesthesia Preprocedure Evaluation (Addendum)
Anesthesia Evaluation  Patient identified by MRN, date of birth, ID band Patient awake    Reviewed: Allergy & Precautions, NPO status , Patient's Chart, lab work & pertinent test results  Airway Mallampati: I  TM Distance: >3 FB Neck ROM: full    Dental  (+) Teeth Intact, Missing, Dental Advidsory Given   Pulmonary shortness of breath and with exertion, sleep apnea and Continuous Positive Airway Pressure Ventilation ,    Pulmonary exam normal        Cardiovascular hypertension, Pt. on medications Normal cardiovascular exam     Neuro/Psych negative neurological ROS  negative psych ROS   GI/Hepatic negative GI ROS, Neg liver ROS,   Endo/Other  diabetes, Oral Hypoglycemic Agents  Renal/GU ESRFRenal disease     Musculoskeletal Gout   Abdominal   Peds  Hematology  (+) Blood dyscrasia, anemia , Thrombocytopenia    Anesthesia Other Findings end stage renal disease  Reproductive/Obstetrics                           Anesthesia Physical Anesthesia Plan  ASA: III  Anesthesia Plan: MAC   Post-op Pain Management:    Induction: Intravenous  PONV Risk Score and Plan: 2 and Ondansetron, Dexamethasone and Treatment may vary due to age or medical condition  Airway Management Planned: Simple Face Mask  Additional Equipment:   Intra-op Plan:   Post-operative Plan:   Informed Consent: I have reviewed the patients History and Physical, chart, labs and discussed the procedure including the risks, benefits and alternatives for the proposed anesthesia with the patient or authorized representative who has indicated his/her understanding and acceptance.     Dental Advisory Given  Plan Discussed with: CRNA  Anesthesia Plan Comments:        Anesthesia Quick Evaluation

## 2019-03-11 NOTE — TOC Transition Note (Signed)
Transition of Care [facility]) - CM/SW Discharge Note   Patient Details  Name: [name] MRN: [id number] Date of Birth: Mar 13, 1930  Transition of Care [facility]) CM/SW Contact:  Bartholomew Crews, RN Phone Number: [phone] 03/11/2019, 2:10 PM   Clinical Narrative:    PTA home with son. Will transition home today. HD will be at [facility] on TTS 11:30. Son to provide transport home. [place] to f/u with [facility] - orders provided for PT, RN, and Aide. No further transition of care needs identified.    Final next level of care: Home w Home Health Services Barriers to Discharge: No Barriers Identified   Patient Goals and CMS Choice   CMS Medicare.gov Compare Post Acute Care list provided to:: Patient Choice offered to / list presented to : Patient  Discharge Placement                       Discharge Plan and Services   Discharge Planning Services: CM Consult Post Acute Care Choice: Home Health          DME Arranged: N/A DME Agency: NA       HH Arranged: RN, PT, Nurse's Aide [place] Agency: [place] Date [facility] Agency Contacted: 03/11/19 Time HH Agency Contacted: 1410 Representative spoke with at [place]: [place] ([place]) Interventions     Readmission Risk Interventions Readmission Risk Prevention Plan 03/03/2019  Transportation Screening Complete  PCP or Specialist Appt within 3-5 Days Not Complete  Not Complete comments not yet ready for d/c  HRI or [place] Not Complete  HRI or Home Care Consult comments no needs at this time  Social Work Consult for [place] Planning/Counseling Not Complete  SW consult not completed comments no needs at this time  Palliative Care Screening Not Applicable  Medication Review Press photographer) Complete  Some recent data might be hidden

## 2019-03-11 NOTE — Progress Notes (Signed)
DISCHARGE NOTE HOME [name] to be discharged Home per MD order. Discussed prescriptions and follow up appointments with the patient. Prescriptions given to patient; medication list explained in detail. Patient verbalized understanding.  Skin clean, dry and intact without evidence of skin break down, no evidence of skin tears noted. IV catheter discontinued intact. Site without signs and symptoms of complications. Dressing and pressure applied. Pt denies pain at the site currently. No complaints noted.  Patient free of lines, drains, and wounds.   An After Visit Summary (AVS) was printed and given to the patient. Patient escorted via wheelchair, and discharged home via private auto.  [name], RN

## 2019-03-11 NOTE — Op Note (Signed)
    NAME: [name]    MRN: [id number] DOB: Jan 15, 1930    DATE OF OPERATION: 03/11/2019  PREOP DIAGNOSIS:    End-stage renal disease  POSTOP DIAGNOSIS:    Same  PROCEDURE:    1.  Right radial cephalic AV fistula 2.  Superficialization of right radiocephalic AV fistula  SURGEON: Judeth Cornfield. Scot Dock, MD, FACS  ASSIST: Leontine Locket, PA  ANESTHESIA: Local with sedation  EBL: Minimal  INDICATIONS:    [name] is a 85 y.o. female who just began dialysis.  She has a functioning dialysis catheter.  We were asked to place long-term access.  She has a pacemaker on the left so we selected a right sided approach.  FINDINGS:   3 mm forearm cephalic vein.  The upper arm cephalic vein was much larger and given the size in the upper arm I was concerned she would develop steal symptoms if we used her upper arm cephalic vein.  TECHNIQUE:   The patient was taken to the operating room and I looked at the cephalic vein with the SonoSite.  I thought she could have a right radiocephalic fistula.  The right arm was prepped and draped in usual sterile fashion.  After the skin was anesthetized with 1% lidocaine an oblique incision was made in the distal forearm before the vein veered laterally.  Here the vein was dissected free and ligated distally.  Irrigated up nicely with heparinized saline.  The radial artery was dissected free of any the fascia.  The patient was heparinized.  The radial artery was clamped proximally and distally and a longitudinal arteriotomy was made.  The vein was sewn into side to the artery after it was cut to the appropriate length and spatulated.  The anastomosis was done with 6-0 Prolene suture.  At the completion there was a good thrill in the fistula and there was a radial and ulnar signal with the Doppler.  The vein was somewhat deep and therefore elected to superficialize this.  After the skin was anesthetized I excised adipose tissue superficial to the  vein.  I then made a separate incision in the mid forearm and again expose the vein ligating several competing branches which were small.  Again I excised some adipose tissue overlying the vein both proximally and distally.  The fistula had a good thrill.  Hemostasis was obtained in the wound and the heparin was partially reversed with protamine.  The wounds were closed with a deep layer of 3-0 Vicryl and the skin closed with 4-0 Vicryl.  Dermabond was applied.  The patient tolerated the procedure well was transferred to the recovery room in stable condition.  All needle and sponge counts were correct.  [name], MD, FACS [facility]  DATE OF DICTATION:   03/11/2019

## 2019-03-11 NOTE — Progress Notes (Signed)
Patient ID: [name], female   DOB: May 13, 1930, 85 y.o.   MRN: [id number] S:No new complaints and tolerated the surgery well O:BP (!) 121/44 (BP Location: Left Wrist)   Pulse 70   Temp (!) 97.3 F (36.3 C)   Resp 16   Ht 5' 5"  (1.651 m)   Wt 76 kg   SpO2 94%   BMI 27.87 kg/m   Intake/Output Summary (Last 24 hours) at 03/11/2019 1439 Last data filed at 03/11/2019 0827 Gross per 24 hour  Intake 500 ml  Output 205 ml  Net 295 ml   Intake/Output: I/O last 3 completed shifts: In: 260 [P.O.:260] Out: 8588 [Urine:450; Other:2000]  Intake/Output this shift:  Total I/O In: 500 [I.V.:300; IV Piggyback:200] Out: 5 [Blood:5] Weight change: -3.1 kg Gen: NAD CVS: no rub Resp: cta Abd: benign Ext: no edema, R AVF +T/B, some edema and ecchymosis.  Recent Labs  Lab 03/05/19 0251 03/06/19 0440 03/07/19 0646 03/08/19 0412 03/09/19 0735 03/10/19 0512 03/11/19 0457  NA 134* 132* 132* 136 136 134* 137  K 3.7 3.6 3.9 4.1 3.8 3.9 4.1  CL 103 100 101 104 100 100 105  CO2 22 20* 20* 22 23 22 26   GLUCOSE 94 89 79 92 85 96 85  BUN 92* 87* 90* 47* 45* 42* 22  CREATININE 4.77* 5.12* 5.37* 3.52* 3.95* 4.12* 3.26*  ALBUMIN 2.1* 1.9* 1.8* 1.8* 1.8* 1.9* 1.8*  CALCIUM 8.5* 8.3* 8.2* 8.0* 8.2* 8.4* 8.2*  PHOS 3.3 RESULTS UNAVAILABLE DUE TO INTERFERING SUBSTANCE >30.0* >30.0* >30.0* >30.0* >30.0*   Liver Function Tests: Recent Labs  Lab 03/09/19 0735 03/10/19 0512 03/11/19 0457  ALBUMIN 1.8* 1.9* 1.8*   No results for input(s): LIPASE, AMYLASE in the last 168 hours. No results for input(s): AMMONIA in the last 168 hours. CBC: Recent Labs  Lab 03/07/19 0646 03/08/19 0412 03/09/19 0735 03/10/19 0512 03/11/19 0457  WBC 6.7 6.1 5.1 4.9 5.2  NEUTROABS  --  3.8  --   --  3.3  HGB 7.8* 7.8* 7.5* 7.8* 7.3*  HCT 23.7* 23.6* 23.2* 23.8* 22.9*  MCV 85.6 87.1 88.5 88.8 89.5  PLT 168 157 146* 131* 130*   Cardiac Enzymes: No results for input(s): CKTOTAL, CKMB, CKMBINDEX,  TROPONINI in the last 168 hours. CBG: Recent Labs  Lab 03/10/19 1207 03/10/19 1626 03/11/19 0532 03/11/19 0905 03/11/19 1009  GLUCAP 96 92 79 79 74    Iron Studies: No results for input(s): IRON, TIBC, TRANSFERRIN, FERRITIN in the last 72 hours. Studies/Results: No results found. Marland Kitchen allopurinol  50 mg Oral Daily  . Chlorhexidine Gluconate Cloth  6 each Topical Q0600  . cholecalciferol  2,000 Units Oral Daily  . dorzolamide  1 drop Left Eye QPM  . feeding supplement (NEPRO CARB STEADY)  237 mL Oral BID BM  . fluorometholone  1 drop Right Eye TID  . insulin aspart  0-9 Units Subcutaneous TID WC  . isosorbide-hydrALAZINE  1 tablet Oral BID  . latanoprost  1 drop Left Eye QHS  . mouth rinse  15 mL Mouth Rinse BID  . pantoprazole  40 mg Oral Daily  . polyethylene glycol  17 g Oral Daily  . pregabalin  50 mg Oral Daily  . senna-docusate  2 tablet Oral BID  . sevelamer carbonate  1,200 mg Oral TID WC    BMET    Component Value Date/Time   NA 137 03/11/2019 0457   K 4.1 03/11/2019 0457   CL 105 03/11/2019 0457  CO2 26 03/11/2019 0457   GLUCOSE 85 03/11/2019 0457   BUN 22 03/11/2019 0457   CREATININE 3.26 (H) 03/11/2019 0457   CREATININE 1.03 (H) 05/11/2016 0931   CALCIUM 8.2 (L) 03/11/2019 0457   GFRNONAA 12 (L) 03/11/2019 0457   GFRAA 14 (L) 03/11/2019 0457   CBC    Component Value Date/Time   WBC 5.2 03/11/2019 0457   RBC 2.56 (L) 03/11/2019 0457   HGB 7.3 (L) 03/11/2019 0457   HCT 22.9 (L) 03/11/2019 0457   PLT 130 (L) 03/11/2019 0457   MCV 89.5 03/11/2019 0457   MCH 28.5 03/11/2019 0457   MCHC 31.9 03/11/2019 0457   RDW 16.5 (H) 03/11/2019 0457   LYMPHSABS 1.2 03/11/2019 0457   MONOABS 0.5 03/11/2019 0457   EOSABS 0.1 03/11/2019 0457   BASOSABS 0.0 03/11/2019 0457     Assessment/Plan:  1. New ESRD due to multiple myeloma.  Unclear baseline as no labs for 3 years but now with oliguric, dialysis-dependent renal failure.  She has outpatient HD  arrangements at [facility] TTS second shift and was instructed to be there by 10:30 am on Thursday 03/13/19.  2. Multiple myeloma- receiving velcade/dexamethasone. Oncology following 3. Vascular access- has RIJ TDC per IR and is to have R AVF creation tomorrow per Dr. Scot Dock. 4. Anemia- due to malignancy and CKD.  For ESA and follow 5. HTN- stable 6. Hyperphosphatemia- was normal 6 days ago at 3.3 but has since been >30.  Unclear if this is tumor lysis syndrome or a lab issue.  She does not have hyperkalemia or symptoms and uric acid is low.  Very odd trend in phos.  Will try to discuss with Onc.  For now will start binders and follow low phos diet.  7. SHPTH- did have elevated calcium due to MM improved with treatment. 8. Disposition- stable for discharge from renal standpoint.  She has outpatient arrangements for HD starting 03/13/19.   Donetta Potts, MD [employer] [phone]

## 2019-03-11 NOTE — Progress Notes (Signed)
Patient has been accepted at [facility] for OP HD treatment on a TTS schedule with a seat time of 11:40am. She needs to arrive at the clinic at 10:30am to sign paperwork on her first day of treatment.  Renal Navigator notified Nephrologist/Dr. Marval Regal, CM/Wendi G. And patient's son/Jacques Shackleton. If patient discharges today or tomorrow, she can start at OP HD clinic on Thursday, 03/13/19. Renal Navigator to follow closely for discharge and update OP HD clinic accordingly so that they may prepare for her start.  Alphonzo Cruise, [place] Renal Navigator [phone]

## 2019-03-11 NOTE — Progress Notes (Signed)
Renal Navigator notes confirmed plan for discharge today and updated OP HD clinic/NW. OP HD clinic prepared for patient to start on Thursday, 03/13/19. (See previous note from 5/26 for full details).  Alphonzo Cruise, [place] Renal Navigator [phone]

## 2019-03-11 NOTE — Progress Notes (Signed)
Patient going to surgery this morning at 0715 has CBG of 79. Baltazar Najjar, NP text paged for orders.

## 2019-03-11 NOTE — Progress Notes (Signed)
Went to see her at 72 am. Patient was off floor for OR  Will return to check on her again later

## 2019-03-11 NOTE — Interval H&P Note (Signed)
History and Physical Interval Note:  03/11/2019 7:10 AM  [name]  has presented today for surgery, with the diagnosis of end stage renal disease.  The various methods of treatment have been discussed with the patient and family. After consideration of risks, benefits and other options for treatment, the patient has consented to  Procedure(s): ARTERIOVENOUS (AV) FISTULA CREATION RIGHT ARM (Right) INSERTION OF DIALYSIS CATHETER (N/A) as a surgical intervention.  The patient's history has been reviewed, patient examined, no change in status, stable for surgery.  I have reviewed the patient's chart and labs.  Questions were answered to the patient's satisfaction.     [name]

## 2019-03-11 NOTE — Discharge Instructions (Signed)
° °  [facility]  Discharge Instructions  AV Fistula or Graft Surgery for Dialysis Access  Please refer to the following instructions for your post-procedure care. Your surgeon or physician assistant will discuss any changes with you.  Activity  You may drive the day following your surgery, if you are comfortable and no longer taking prescription pain medication. Resume full activity as the soreness in your incision resolves.  Bathing/Showering  You may shower after you go home. Keep your incision dry for 48 hours. Do not soak in a bathtub, hot tub, or swim until the incision heals completely. You may not shower if you have a hemodialysis catheter.  Incision Care  Clean your incision with mild soap and water after 48 hours. Pat the area dry with a clean towel. You do not need a bandage unless otherwise instructed. Do not apply any ointments or creams to your incision. You may have skin glue on your incision. Do not peel it off. It will come off on its own in about one week. Your arm may swell a bit after surgery. To reduce swelling use pillows to elevate your arm so it is above your heart. Your doctor will tell you if you need to lightly wrap your arm with an ACE bandage.  Diet  Resume your normal diet. There are not special food restrictions following this procedure. In order to heal from your surgery, it is CRITICAL to get adequate nutrition. Your body requires vitamins, minerals, and protein. Vegetables are the best source of vitamins and minerals. Vegetables also provide the perfect balance of protein. Processed food has little nutritional value, so try to avoid this.  Medications  Resume taking all of your medications. If your incision is causing pain, you may take over-the counter pain relievers such as acetaminophen (Tylenol). If you were prescribed a stronger pain medication, please be aware these medications can cause nausea and constipation. Prevent  nausea by taking the medication with a snack or meal. Avoid constipation by drinking plenty of fluids and eating foods with high amount of fiber, such as fruits, vegetables, and grains.  Do not take Tylenol if you are taking prescription pain medications.  Follow up Your surgeon may want to see you in the office following your access surgery. If so, this will be arranged at the time of your surgery.  Please call us immediately for any of the following conditions:  Increased pain, redness, drainage (pus) from your incision site Fever of 101 degrees or higher Severe or worsening pain at your incision site Hand pain or numbness.  Reduce your risk of vascular disease:  Stop smoking. If you would like help, call [facility] at 1-800-QUIT-NOW (236)622-4568) or [place] [facility] [place] your cholesterol Maintain a desired weight Control your diabetes Keep your blood pressure down  Dialysis  It will take several weeks to several months for your new dialysis access to be ready for use. Your surgeon will determine when it is okay to use it. Your nephrologist will continue to direct your dialysis. You can continue to use your Permcath until your new access is ready for use.   03/11/2019 [name] [id number] [birth year]  Surgeon(s): Angelia Mould, MD  Procedure(s): CREATION RIGHT ARM RADIOCEPHALIC ARTERIOVENOUS FISTULA FISTULA SUPERFICIALIZATION  x Do not stick fistula for 12 weeks    If you have any questions, please call the office at [phone].

## 2019-03-11 NOTE — Transfer of Care (Signed)
Immediate Anesthesia Transfer of Care Note  Patient: [name]  Procedure(s) Performed: CREATION RIGHT ARM RADIOCEPHALIC ARTERIOVENOUS FISTULA (Right Arm Lower) FISTULA SUPERFICIALIZATION (Right Arm Lower)  Patient Location: PACU  Anesthesia Type:MAC  Level of Consciousness: awake, alert  and oriented  Airway & Oxygen Therapy: Patient Spontanous Breathing and Patient connected to nasal cannula oxygen  Post-op Assessment: Report given to RN, Post -op Vital signs reviewed and stable and Patient moving all extremities X 4  Post vital signs: Reviewed and stable  Last Vitals:  Vitals Value Taken Time  BP 123/46 03/11/2019  9:04 AM  Temp    Pulse 70 03/11/2019  9:04 AM  Resp 20 03/11/2019  9:04 AM  SpO2 91 % 03/11/2019  9:04 AM  Vitals shown include unvalidated device data.  Last Pain:  Vitals:   03/11/19 0719  TempSrc:   PainSc: 0-No pain      Patients Stated Pain Goal: 3 (32/99/24 2683)  Complications: No apparent anesthesia complications

## 2019-03-11 NOTE — Consult Note (Signed)
   [facility] CM Inpatient Consult   03/11/2019  [name] [birth year] [id number]    Patient screened forhighrisk score of28% for unplanned readmission/ hospitalization, andto check if potential [facility] services are needed with her[employer] Medicare benefits.  Chart review and MD's history and physical on5/12/20 reveal as follows: [name] is a 85 y.o.female with medical history significant of type 2 diabetes, obstructive sleep apnea, hypertension, sick sinus syndrome came into the ER after the primary care physician told her to come to the ER for abnormal kidney labs.  Patient reported that she noticed a decrease in the urine output for the last few days, with poor appetite and has been having nausea but no vomiting no diarrhea in fact she is very constipated; she has been having a cough productive of yellow phlegm occasionally feels short of breath and has chronic pleuritic chest pain. She is never known to have a history of kidney disease. (acute kidney injury, End stage renal disease, hyponatremia/hypokalemia status post AV fistula creation- Insertion of dialysis catheter)  Called to speak to patient to identify possible discharge needs but unit secretary reports that she had been discharged home.  PT has recommended home health PT at discharge.  Per transition of care CM note reviewed, patient lives with son prior to admission. Will transition home today. HD will be at [facility] on TTS 11:30. Son to provide transportation to home. [facility] to follow-up with [facility] - orders provided for PT, RN, and Aide, and no further transition of care needs and barriers identified.  Patient may benefit from EMMI calls to follow-up her continued recoveryafter discharge. Referral made for [facility] General calls post discharge.  Patient's primary care provider isDr.Veita Criss West with [facility], listed as providing transition of care.   For questions and additional  information, please call:  [name], BSN, RN-BC [facility] Liaison Cell: [phone]

## 2019-03-11 NOTE — Progress Notes (Signed)
Visit made to patients room to verify she was ok with her home CPAP.  Patient states she is good and has everything she needs.

## 2019-03-11 NOTE — Progress Notes (Signed)
[name]   DOB:08/25/1930   EX#:[id number]    ASSESSMENT & PLAN:  #1Elevated SPEP and light chainsconsistent with multiple myeloma  Her current work-up is incomplete; recent labs confirmed IgG kappa myeloma Skeletal survey is reviewed which show no evidence of impeding bone fractures Bone marrow biopsy was performed on 03/04/2019.Specimens sent for FISH, cytogenetics and additional studies.Results confirmed her disease  She has completed 4 days of dexamethasone with improvement of her bone pain and hypercalcemia She was started on Velcade on 03/04/2019 following her bone marrow biopsy Due to her multiple comorbidities,she will receiveVelcade q. 7 days, next dose due next week The patient tolerated her first dose well overall.  Due to plan for DC, I will schedule her chemo in the outpatient on Friday Her son will be contacted for her appt  #2 Anemia Likely due to her underlying bone marrow disorder She was transfused on 03/02/2019 Monitor closely No need transfusion today Defer future transfusion decision to nephrologist and management of anemia of chronic renal failure  #3 Malignant hypercalcemia,resolved She appears to be responding to steroids and malignant help her calcium is resolving Observe for now  #4 Acute renal failure, on HD She has acute on chronic renal failure due to undiagnosed multiple myeloma I am hopeful that high-dose corticosteroid therapy might improve her situation Appreciate nephrology follow-up She was given 1 dose of rasburicase on 03/03/2019 Tolerated HD well, future HD on TTS  #5Chest pain with elevated troponin, resolved Echocardiogram show no evidence of cardiomyopathy Her chest pain could be triggered by hyperviscosity related to untreated multiple myeloma. Appreciate cardiology follow-up  #6 Severe bone pain, resolved Likely due to her underlying bone marrow disorder and hypercalcemia Skeletal survey showed no evidence of  impeding bone fracture  #7 constipation Continue aggressive laxative therapy   All questions were answered. The patient knows to call the clinic with any problems, questions or concerns. Will sign off Fllow-up on Friday out patient. Her daughter is informed Heath Lark, MD 03/11/2019 1:51 PM  Subjective:  SHe tolerated surgery well.  She continues to have constipation.  Her bone pain is stable.  Objective:  Vitals:   03/11/19 0918 03/11/19 0928  BP: (!) 119/49 (!) 121/44  Pulse: 72 70  Resp: (!) 22 16  Temp:  (!) 97.3 F (36.3 C)  SpO2: 94% 94%     Intake/Output Summary (Last 24 hours) at 03/11/2019 1351 Last data filed at 03/11/2019 0827 Gross per 24 hour  Intake 500 ml  Output 205 ml  Net 295 ml    GENERAL:alert, no distress and comfortable NEURO: alert & oriented x 3 with fluent speech, no focal motor/sensory deficits   Labs:  Recent Labs    02/25/19 1103 02/26/19 0353  03/09/19 0735 03/10/19 0512 03/11/19 0457  NA 137 138   < > 136 134* 137  K 3.1* 3.5   < > 3.8 3.9 4.1  CL 101 104   < > 100 100 105  CO2 23 23   < > 23 22 26   GLUCOSE 127* 88   < > 85 96 85  BUN 50* 45*   < > 45* 42* 22  CREATININE 4.46* 4.21*   < > 3.95* 4.12* 3.26*  CALCIUM 10.8* 10.2   < > 8.2* 8.4* 8.2*  GFRNONAA 8* 9*   < > 10* 9* 12*  GFRAA 10* 10*   < > 11* 11* 14*  PROT 10.7* 9.4*  --   --   --   --  ALBUMIN 3.0* 2.5*   < > 1.8* 1.9* 1.8*  AST 18 18  --   --   --   --   ALT 12 13  --   --   --   --   ALKPHOS 30* 26*  --   --   --   --   BILITOT 0.8 0.3  --   --   --   --    < > = values in this interval not displayed.    Studies:  Dg Chest 1 View  Result Date: 02/25/2019 CLINICAL DATA:  Acute kidney injury and cough. EXAM: CHEST  1 VIEW COMPARISON:  None. FINDINGS: Left chest wall pacer device is noted with leads in the right atrial appendage and right ventricle. Mild cardiac enlargement. Aortic atherosclerosis. No pleural effusion or edema. No airspace opacities.  IMPRESSION: 1. Cardiac enlargement.  No acute findings. 2.  Aortic Atherosclerosis (ICD10-I70.0). Electronically Signed   By: Kerby Moors M.D.   On: 02/25/2019 13:51   Dg Chest 2 View  Result Date: 03/01/2019 CLINICAL DATA:  Hypertension.  Diabetes. EXAM: CHEST - 2 VIEW COMPARISON:  Feb 28, 2019 FINDINGS: Stable cardiomegaly. The hila and mediastinum are unremarkable. Mild atelectasis in the left base. Mild patchy opacity in the right lung, unchanged. No other acute changes. IMPRESSION: Stable patchy opacities in the right lung. No other interval changes. Electronically Signed   By: Dorise Bullion III M.D   On: 03/01/2019 17:04   US Renal  Result Date: 02/25/2019 CLINICAL DATA:  85 year old female with acute renal failure EXAM: RENAL / URINARY TRACT ULTRASOUND COMPLETE COMPARISON:  None. FINDINGS: Right Kidney: Renal measurements: 11.9 x 4.1 x 4.3 cm = volume: 111 mL . Echogenicity within normal limits. No mass or hydronephrosis visualized. Left Kidney: Renal measurements: 12.5 x 4.2 x 4.2 cm = volume: 115 mL. Two renal cysts are present measuring 1.2 cm. Echogenicity within normal limits. No mass or hydronephrosis visualized. Bladder: Appears normal for degree of bladder distention, but bladder not well distended. IMPRESSION: 1. Unremarkable kidneys except for 2 small LEFT renal cysts. No evidence of hydronephrosis. Electronically Signed   By: Margarette Canada M.D.   On: 02/25/2019 16:00   Ir Fluoro Guide Cv Line Right  Result Date: 03/07/2019 INDICATION: 41 year old with acute kidney injury.  Need for hemodialysis. EXAM: FLUOROSCOPIC AND ULTRASOUND GUIDED PLACEMENT OF A TUNNELED DIALYSIS CATHETER Physician: Stephan Minister. Anselm Pancoast, MD MEDICATIONS: Vancomycin 1 gm IV; The antibiotic was administered within an appropriate time interval prior to skin puncture. ANESTHESIA/SEDATION: Versed 1.0 mg IV; Fentanyl 50 mcg IV; Moderate Sedation Time:  22 minutes The patient was continuously monitored during the procedure by  the interventional radiology nurse under my direct supervision. FLUOROSCOPY TIME:  Fluoroscopy Time: 30 seconds, 2 mGy COMPLICATIONS: None immediate. PROCEDURE: Informed consent was obtained for placement of a tunneled dialysis catheter. The patient was placed supine on the interventional table. Ultrasound confirmed a patent right internal jugular vein. Ultrasound images were obtained for documentation. The right side of the neck and chest was prepped and draped in a sterile fashion. The right neck was anesthetized with 1% lidocaine. Maximal barrier sterile technique was utilized including caps, mask, sterile gowns, sterile gloves, sterile drape, hand hygiene and skin antiseptic. A small incision was made with #11 blade scalpel. A 21 gauge needle directed into the right internal jugular vein with ultrasound guidance. A micropuncture dilator set was placed. A 19 cm tip to cuff Palindrome catheter was selected. The skin below  the right clavicle was anesthetized and a small incision was made with an #11 blade scalpel. A subcutaneous tunnel was formed to the vein dermatotomy site. The catheter was brought through the tunnel. The vein dermatotomy site was dilated to accommodate a peel-away sheath. The catheter was placed through the peel-away sheath and directed into the central venous structures. The tip of the catheter was placed at the superior cavoatrial junction with fluoroscopy. Fluoroscopic images were obtained for documentation. Both lumens were found to aspirate and flush well. The proper amount of heparin was flushed in both lumens. The vein dermatotomy site was closed using a single layer of absorbable suture and Dermabond. Gel-Foam was placed in the subcutaneous tract for hemostasis. The catheter was secured to the skin using Prolene suture. IMPRESSION: Successful placement of a right jugular tunneled dialysis catheter using ultrasound and fluoroscopic guidance. Electronically Signed   By: Markus Daft M.D.    On: 03/07/2019 10:45   Ir US Guide Vasc Access Right  Result Date: 03/07/2019 INDICATION: 70 year old with acute kidney injury.  Need for hemodialysis. EXAM: FLUOROSCOPIC AND ULTRASOUND GUIDED PLACEMENT OF A TUNNELED DIALYSIS CATHETER Physician: Stephan Minister. Anselm Pancoast, MD MEDICATIONS: Vancomycin 1 gm IV; The antibiotic was administered within an appropriate time interval prior to skin puncture. ANESTHESIA/SEDATION: Versed 1.0 mg IV; Fentanyl 50 mcg IV; Moderate Sedation Time:  22 minutes The patient was continuously monitored during the procedure by the interventional radiology nurse under my direct supervision. FLUOROSCOPY TIME:  Fluoroscopy Time: 30 seconds, 2 mGy COMPLICATIONS: None immediate. PROCEDURE: Informed consent was obtained for placement of a tunneled dialysis catheter. The patient was placed supine on the interventional table. Ultrasound confirmed a patent right internal jugular vein. Ultrasound images were obtained for documentation. The right side of the neck and chest was prepped and draped in a sterile fashion. The right neck was anesthetized with 1% lidocaine. Maximal barrier sterile technique was utilized including caps, mask, sterile gowns, sterile gloves, sterile drape, hand hygiene and skin antiseptic. A small incision was made with #11 blade scalpel. A 21 gauge needle directed into the right internal jugular vein with ultrasound guidance. A micropuncture dilator set was placed. A 19 cm tip to cuff Palindrome catheter was selected. The skin below the right clavicle was anesthetized and a small incision was made with an #11 blade scalpel. A subcutaneous tunnel was formed to the vein dermatotomy site. The catheter was brought through the tunnel. The vein dermatotomy site was dilated to accommodate a peel-away sheath. The catheter was placed through the peel-away sheath and directed into the central venous structures. The tip of the catheter was placed at the superior cavoatrial junction with  fluoroscopy. Fluoroscopic images were obtained for documentation. Both lumens were found to aspirate and flush well. The proper amount of heparin was flushed in both lumens. The vein dermatotomy site was closed using a single layer of absorbable suture and Dermabond. Gel-Foam was placed in the subcutaneous tract for hemostasis. The catheter was secured to the skin using Prolene suture. IMPRESSION: Successful placement of a right jugular tunneled dialysis catheter using ultrasound and fluoroscopic guidance. Electronically Signed   By: Markus Daft M.D.   On: 03/07/2019 10:45   Dg Chest Port 1 View  Result Date: 02/28/2019 CLINICAL DATA:  Dyspnea. EXAM: PORTABLE CHEST 1 VIEW COMPARISON:  Radiograph of Feb 25, 2019. FINDINGS: Stable cardiomegaly. Atherosclerosis of thoracic aorta is noted. Left-sided pacemaker is unchanged in position. No pneumothorax or pleural effusion is noted. Left lung is clear. Mildly increased  right lung opacities are noted concerning for either asymmetric edema or possibly pneumonia. Severe degenerative changes seen involving the right glenohumeral joint. IMPRESSION: Mildly increased right lung opacities are noted concerning for possible asymmetric edema or possibly pneumonia. Aortic Atherosclerosis (ICD10-I70.0). Electronically Signed   By: Marijo Conception M.D.   On: 02/28/2019 12:26   Dg Bone Survey Met  Result Date: 03/03/2019 CLINICAL DATA:  85 year old female with multiple myeloma, pain radiating through the right leg. EXAM: METASTATIC BONE SURVEY COMPARISON:  CT lumbar myelogram 06/19/2018 FINDINGS: Scattered round lucent lesions in the skull. Degenerative changes in the cervical spine where bone mineralization appears within normal limits. Degenerative changes in the thoracic spine and lumbar spine. No spinal compression fracture identified. Spine bone mineralization appears within normal limits. Previous lower lumbar interbody fusion. Calcified aortic atherosclerosis. Questionable  heterogeneous lucency in the iliac wings. Femoral heads are normally located and hip joint spaces are normal for age. Pubic rami appear intact. No definite lytic lesion in the right lower extremity. Total right knee arthroplasty. There is an oval lytic lesion in the distal left femur measuring 11 millimeters (arrow). Prior left knee arthroplasty also. Elsewhere in the left lower extremity bone mineralization appears normal for age. Advanced degenerative changes at the right shoulder and wrist. There are lucent lesions in the distal right humerus. Cardiomegaly with left chest cardiac pacemaker. No discrete rib lesion identified. Small lucent lesions in the mid and distal humerus. Mild to moderate degenerative changes in the left upper extremity. No pathologic fracture identified. IMPRESSION: 1. Round and oval lucent/lytic lesions in the skull, bilateral humeri, distal left femur, and possibly also in the pelvis are compatible with multiple myeloma. No right lower extremity lytic lesion identified. No pathologic fracture. 2. Advanced degenerative changes in the spine with no vertebral myeloma evident by plain radiography. 3. Advanced degenerative changes in the right upper extremity. Bilateral knee arthroplasty. 4.  Aortic Atherosclerosis (ICD10-I70.0). Electronically Signed   By: Genevie Ann M.D.   On: 03/03/2019 19:59   Vas [place] Upper Extremity Arterial Duplex  Result Date: 03/08/2019 UPPER EXTREMITY DUPLEX STUDY Indications: Pre operative for dialysis access.  Comparison Study: No prior study Performing Technologist: Sharion Dove RVS  Examination Guidelines: A complete evaluation includes B-mode imaging, spectral Doppler, color Doppler, and power Doppler as needed of all accessible portions of each vessel. Bilateral testing is considered an integral part of a complete examination. Limited examinations for reoccurring indications may be performed as noted.  Right Doppler Findings:  +--------+----------+---------+-----------+--------+ Site    PSV (cm/s)Waveform Plaque     Comments +--------+----------+---------+-----------+--------+ [id number]       triphasicno stenosis         +--------+----------+---------+-----------+--------+ Radial  83        triphasicno stenosis         +--------+----------+---------+-----------+--------+ Ulnar   68        triphasicno stenosistortuous +--------+----------+---------+-----------+--------+ Right Pre-Dialysis Findings: +-----------------------+----------+--------------------+---------+--------+ Location               PSV (cm/s)Intralum. Diam. (cm)Waveform Comments +-----------------------+----------+--------------------+---------+--------+ Brachial Antecub. fossa          0.60                triphasic         +-----------------------+----------+--------------------+---------+--------+ Radial Art at Wrist              0.33                triphasic         +-----------------------+----------+--------------------+---------+--------+  Ulnar Art at Wrist               0.32                triphasic         +-----------------------+----------+--------------------+---------+--------+ Left Doppler Findings: +--------+----------+---------+-----------+--------+ Site    PSV (cm/s)Waveform Plaque     Comments +--------+----------+---------+-----------+--------+ [id number]        triphasicno stenosis         +--------+----------+---------+-----------+--------+ Radial  182       triphasicno stenosis         +--------+----------+---------+-----------+--------+ Ulnar   133       triphasicno stenosis         +--------+----------+---------+-----------+--------+ Left Pre-Dialysis Findings: +-----------------------+----------+--------------------+---------+--------+ Location               PSV (cm/s)Intralum. Diam. (cm)Waveform Comments  +-----------------------+----------+--------------------+---------+--------+ Brachial Antecub. fossa          0.65                triphasic         +-----------------------+----------+--------------------+---------+--------+ Radial Art at Wrist              0.33                triphasic         +-----------------------+----------+--------------------+---------+--------+ Ulnar Art at Wrist               0.22                triphasic         +-----------------------+----------+--------------------+---------+--------+  Summary:  Right: No obstruction visualized in the right upper extremity. Left: No obstruction visualized in the left upper extremity. *See table(s) above for measurements and observations. Electronically signed by [name] MD on 03/08/2019 at 5:49:17 PM.    Final    Vas [place] Upper Ext Vein Mapping (pre-op Avf)  Result Date: 03/08/2019 UPPER EXTREMITY VEIN MAPPING  Indications: Pre-dialysis access. Limitations: Bandages, IV lines Comparison Study: No prior study on file for comparison Performing Technologist: Sharion Dove RVS  Examination Guidelines: A complete evaluation includes B-mode imaging, spectral Doppler, color Doppler, and power Doppler as needed of all accessible portions of each vessel. Bilateral testing is considered an integral part of a complete examination. Limited examinations for reoccurring indications may be performed as noted. +-----------------+-------------+----------+---------+ Right Cephalic   Diameter (cm)Depth (cm)Findings  +-----------------+-------------+----------+---------+ Prox upper arm       0.50        0.44             +-----------------+-------------+----------+---------+ Mid upper arm        0.58        0.48             +-----------------+-------------+----------+---------+ Dist upper arm       0.60        0.46             +-----------------+-------------+----------+---------+ Antecubital fossa    0.70        0.42              +-----------------+-------------+----------+---------+ Prox forearm         0.42        0.36   branching +-----------------+-------------+----------+---------+ Mid forearm          0.36        0.55   branching +-----------------+-------------+----------+---------+ Wrist                0.[place]  0.81             +-----------------+-------------+----------+---------+ +--------------+-------------+----------+---------+ Right Basilic Diameter (cm)Depth (cm)Findings  +--------------+-------------+----------+---------+ Dist upper arm                       branching +--------------+-------------+----------+---------+ +-----------------+-------------+----------+--------------+ Left Cephalic    Diameter (cm)Depth (cm)   Findings    +-----------------+-------------+----------+--------------+ Prox upper arm                          not visualized +-----------------+-------------+----------+--------------+ Mid upper arm        0.41        0.66                  +-----------------+-------------+----------+--------------+ Dist upper arm       0.37        0.59     branching    +-----------------+-------------+----------+--------------+ Antecubital fossa    0.53        0.67                  +-----------------+-------------+----------+--------------+ Prox forearm         0.21        0.24                  +-----------------+-------------+----------+--------------+ Mid forearm          0.12        0.29                  +-----------------+-------------+----------+--------------+ Wrist                0.19        0.33                  +-----------------+-------------+----------+--------------+ +-----------------+-------------+----------+----------------------------+ Left Basilic     Diameter (cm)Depth (cm)          Findings           +-----------------+-------------+----------+----------------------------+ Prox upper arm                                  not visualized        +-----------------+-------------+----------+----------------------------+ Mid upper arm        0.49        2.24              origin            +-----------------+-------------+----------+----------------------------+ Dist upper arm       0.52        2.34            branching           +-----------------+-------------+----------+----------------------------+ Antecubital fossa    0.30        0.96                                +-----------------+-------------+----------+----------------------------+ Prox forearm                                   not visualized        +-----------------+-------------+----------+----------------------------+ Mid forearm                             branching and not visualized +-----------------+-------------+----------+----------------------------+  Wrist                                          not visualized        +-----------------+-------------+----------+----------------------------+ *See table(s) above for measurements and observations.  Diagnosing physician: [name] MD Electronically signed by [name] MD on 03/08/2019 at 5:50:00 PM.    Final

## 2019-03-12 ENCOUNTER — Other Ambulatory Visit: Payer: Self-pay | Admitting: Hematology and Oncology

## 2019-03-12 ENCOUNTER — Telehealth: Payer: Self-pay

## 2019-03-12 ENCOUNTER — Telehealth: Payer: Self-pay | Admitting: Hematology and Oncology

## 2019-03-12 ENCOUNTER — Encounter (HOSPITAL_COMMUNITY): Payer: Self-pay | Admitting: Hematology and Oncology

## 2019-03-12 ENCOUNTER — Encounter (HOSPITAL_COMMUNITY): Payer: Self-pay | Admitting: Vascular Surgery

## 2019-03-12 DIAGNOSIS — M19071 Primary osteoarthritis, right ankle and foot: Secondary | ICD-10-CM | POA: Diagnosis not present

## 2019-03-12 DIAGNOSIS — M170 Bilateral primary osteoarthritis of knee: Secondary | ICD-10-CM | POA: Diagnosis not present

## 2019-03-12 DIAGNOSIS — E1122 Type 2 diabetes mellitus with diabetic chronic kidney disease: Secondary | ICD-10-CM | POA: Diagnosis not present

## 2019-03-12 DIAGNOSIS — C9000 Multiple myeloma not having achieved remission: Secondary | ICD-10-CM

## 2019-03-12 DIAGNOSIS — N186 End stage renal disease: Secondary | ICD-10-CM | POA: Diagnosis not present

## 2019-03-12 DIAGNOSIS — D630 Anemia in neoplastic disease: Secondary | ICD-10-CM | POA: Diagnosis not present

## 2019-03-12 DIAGNOSIS — D61818 Other pancytopenia: Secondary | ICD-10-CM

## 2019-03-12 DIAGNOSIS — M1030 Gout due to renal impairment, unspecified site: Secondary | ICD-10-CM | POA: Diagnosis not present

## 2019-03-12 DIAGNOSIS — D631 Anemia in chronic kidney disease: Secondary | ICD-10-CM | POA: Diagnosis not present

## 2019-03-12 DIAGNOSIS — E1142 Type 2 diabetes mellitus with diabetic polyneuropathy: Secondary | ICD-10-CM | POA: Diagnosis not present

## 2019-03-12 DIAGNOSIS — I120 Hypertensive chronic kidney disease with stage 5 chronic kidney disease or end stage renal disease: Secondary | ICD-10-CM | POA: Diagnosis not present

## 2019-03-12 NOTE — Telephone Encounter (Signed)
Daughter called and left message to clarify appt times for Friday.  Called back and given appt times for 5/29. She verbalized understanding.

## 2019-03-12 NOTE — Telephone Encounter (Signed)
Scheduled appt per sch msg. Tried to contact patient. No answer and no voicemail. Will try to contact again.

## 2019-03-13 ENCOUNTER — Encounter (HOSPITAL_COMMUNITY): Payer: Self-pay | Admitting: Hematology and Oncology

## 2019-03-13 ENCOUNTER — Other Ambulatory Visit: Payer: Self-pay | Admitting: *Deleted

## 2019-03-13 DIAGNOSIS — N2581 Secondary hyperparathyroidism of renal origin: Secondary | ICD-10-CM | POA: Diagnosis not present

## 2019-03-13 DIAGNOSIS — D689 Coagulation defect, unspecified: Secondary | ICD-10-CM | POA: Diagnosis not present

## 2019-03-13 DIAGNOSIS — D631 Anemia in chronic kidney disease: Secondary | ICD-10-CM | POA: Diagnosis not present

## 2019-03-13 DIAGNOSIS — E0822 Diabetes mellitus due to underlying condition with diabetic chronic kidney disease: Secondary | ICD-10-CM | POA: Diagnosis not present

## 2019-03-13 DIAGNOSIS — N186 End stage renal disease: Secondary | ICD-10-CM | POA: Diagnosis not present

## 2019-03-14 ENCOUNTER — Inpatient Hospital Stay: Payer: Medicare HMO

## 2019-03-14 ENCOUNTER — Other Ambulatory Visit: Payer: Self-pay

## 2019-03-14 ENCOUNTER — Encounter: Payer: Self-pay | Admitting: Hematology and Oncology

## 2019-03-14 ENCOUNTER — Telehealth: Payer: Self-pay | Admitting: Hematology and Oncology

## 2019-03-14 ENCOUNTER — Inpatient Hospital Stay: Payer: Medicare HMO | Attending: Hematology and Oncology | Admitting: Hematology and Oncology

## 2019-03-14 VITALS — BP 140/60 | HR 69 | Temp 98.9°F | Resp 17 | Ht 65.0 in

## 2019-03-14 DIAGNOSIS — C9000 Multiple myeloma not having achieved remission: Secondary | ICD-10-CM

## 2019-03-14 DIAGNOSIS — I120 Hypertensive chronic kidney disease with stage 5 chronic kidney disease or end stage renal disease: Secondary | ICD-10-CM | POA: Diagnosis not present

## 2019-03-14 DIAGNOSIS — I700 Atherosclerosis of aorta: Secondary | ICD-10-CM | POA: Diagnosis not present

## 2019-03-14 DIAGNOSIS — M170 Bilateral primary osteoarthritis of knee: Secondary | ICD-10-CM | POA: Diagnosis not present

## 2019-03-14 DIAGNOSIS — Z79899 Other long term (current) drug therapy: Secondary | ICD-10-CM | POA: Diagnosis not present

## 2019-03-14 DIAGNOSIS — D630 Anemia in neoplastic disease: Secondary | ICD-10-CM | POA: Diagnosis not present

## 2019-03-14 DIAGNOSIS — K5900 Constipation, unspecified: Secondary | ICD-10-CM | POA: Diagnosis not present

## 2019-03-14 DIAGNOSIS — R918 Other nonspecific abnormal finding of lung field: Secondary | ICD-10-CM | POA: Insufficient documentation

## 2019-03-14 DIAGNOSIS — E119 Type 2 diabetes mellitus without complications: Secondary | ICD-10-CM | POA: Insufficient documentation

## 2019-03-14 DIAGNOSIS — K5909 Other constipation: Secondary | ICD-10-CM | POA: Insufficient documentation

## 2019-03-14 DIAGNOSIS — D61818 Other pancytopenia: Secondary | ICD-10-CM | POA: Diagnosis not present

## 2019-03-14 DIAGNOSIS — R110 Nausea: Secondary | ICD-10-CM | POA: Insufficient documentation

## 2019-03-14 DIAGNOSIS — R112 Nausea with vomiting, unspecified: Secondary | ICD-10-CM | POA: Insufficient documentation

## 2019-03-14 DIAGNOSIS — D631 Anemia in chronic kidney disease: Secondary | ICD-10-CM | POA: Diagnosis not present

## 2019-03-14 DIAGNOSIS — E1122 Type 2 diabetes mellitus with diabetic chronic kidney disease: Secondary | ICD-10-CM | POA: Diagnosis not present

## 2019-03-14 DIAGNOSIS — E1142 Type 2 diabetes mellitus with diabetic polyneuropathy: Secondary | ICD-10-CM | POA: Diagnosis not present

## 2019-03-14 DIAGNOSIS — M1030 Gout due to renal impairment, unspecified site: Secondary | ICD-10-CM | POA: Diagnosis not present

## 2019-03-14 DIAGNOSIS — Z992 Dependence on renal dialysis: Secondary | ICD-10-CM | POA: Diagnosis not present

## 2019-03-14 DIAGNOSIS — M19071 Primary osteoarthritis, right ankle and foot: Secondary | ICD-10-CM | POA: Diagnosis not present

## 2019-03-14 DIAGNOSIS — N186 End stage renal disease: Secondary | ICD-10-CM | POA: Diagnosis not present

## 2019-03-14 LAB — CBC WITH DIFFERENTIAL/PLATELET
Abs Immature Granulocytes: 0.02 10*3/uL (ref 0.00–0.07)
Basophils Absolute: 0 10*3/uL (ref 0.0–0.1)
Basophils Relative: 0 %
Eosinophils Absolute: 0.2 10*3/uL (ref 0.0–0.5)
Eosinophils Relative: 3 %
HCT: 25.1 % — ABNORMAL LOW (ref 36.0–46.0)
Hemoglobin: 7.9 g/dL — ABNORMAL LOW (ref 12.0–15.0)
Immature Granulocytes: 0 %
Lymphocytes Relative: 18 %
Lymphs Abs: 1.1 10*3/uL (ref 0.7–4.0)
MCH: 28.2 pg (ref 26.0–34.0)
MCHC: 31.5 g/dL (ref 30.0–36.0)
MCV: 89.6 fL (ref 80.0–100.0)
Monocytes Absolute: 0.4 10*3/uL (ref 0.1–1.0)
Monocytes Relative: 7 %
Neutro Abs: 4.5 10*3/uL (ref 1.7–7.7)
Neutrophils Relative %: 72 %
Platelets: 147 10*3/uL — ABNORMAL LOW (ref 150–400)
RBC: 2.8 MIL/uL — ABNORMAL LOW (ref 3.87–5.11)
RDW: 16 % — ABNORMAL HIGH (ref 11.5–15.5)
WBC: 6.2 10*3/uL (ref 4.0–10.5)
nRBC: 0 % (ref 0.0–0.2)

## 2019-03-14 LAB — COMPREHENSIVE METABOLIC PANEL
ALT: 13 U/L (ref 0–44)
AST: 17 U/L (ref 15–41)
Albumin: 2.3 g/dL — ABNORMAL LOW (ref 3.5–5.0)
Alkaline Phosphatase: 35 U/L — ABNORMAL LOW (ref 38–126)
Anion gap: 10 (ref 5–15)
BUN: 14 mg/dL (ref 8–23)
CO2: 26 mmol/L (ref 22–32)
Calcium: 7.9 mg/dL — ABNORMAL LOW (ref 8.9–10.3)
Chloride: 96 mmol/L — ABNORMAL LOW (ref 98–111)
Creatinine, Ser: 3.89 mg/dL (ref 0.44–1.00)
GFR calc Af Amer: 11 mL/min — ABNORMAL LOW (ref >60)
GFR calc non Af Amer: 10 mL/min — ABNORMAL LOW (ref >60)
Glucose, Bld: 85 mg/dL (ref 70–99)
Potassium: 3.5 mmol/L (ref 3.5–5.1)
Sodium: 132 mmol/L — ABNORMAL LOW (ref 135–145)
Total Bilirubin: 0.6 mg/dL (ref 0.3–1.2)
Total Protein: 10.3 g/dL — ABNORMAL HIGH (ref 6.5–8.1)

## 2019-03-14 LAB — SAMPLE TO BLOOD BANK

## 2019-03-14 MED ORDER — PROCHLORPERAZINE MALEATE 10 MG PO TABS
10.0000 mg | ORAL_TABLET | Freq: Once | ORAL | Status: AC
  Administered 2019-03-14: 10:00:00 10 mg via ORAL

## 2019-03-14 MED ORDER — PROCHLORPERAZINE MALEATE 5 MG PO TABS: 5.0000 mg | ORAL_TABLET | Freq: Four times a day (QID) | ORAL | 1 refills | Status: DC | PRN

## 2019-03-14 MED ORDER — PROCHLORPERAZINE MALEATE 10 MG PO TABS
ORAL_TABLET | ORAL | Status: AC
  Filled 2019-03-14: qty 1

## 2019-03-14 MED ORDER — SODIUM CHLORIDE 0.9 % IV SOLN
Freq: Once | INTRAVENOUS | Status: AC
  Administered 2019-03-14: 11:00:00 via INTRAVENOUS
  Filled 2019-03-14: qty 2

## 2019-03-14 MED ORDER — ONDANSETRON HCL 4 MG PO TABS: 4.0000 mg | ORAL_TABLET | Freq: Three times a day (TID) | ORAL | 1 refills | Status: DC | PRN

## 2019-03-14 NOTE — Progress Notes (Signed)
[facility] OFFICE PROGRESS NOTE  Patient Care Team: Lucianne Lei, MD as PCP - General (Family Medicine)  ASSESSMENT & PLAN:  Multiple myeloma not having achieved remission ([place]) Unfortunately, due to uncontrolled nausea, vomiting and constipation and feeling unwell, we will hold treatment today Her symptoms has improved with antiemetics I will see her again next week for further follow-up I will contact her son again next week to follow on her symptoms.  Pancytopenia, acquired ([place]) She has multifactorial anemia, likely combination of bone marrow disease and anemia renal failure I would defer treatment to nephrologist since she is on dialysis   ESRD (end stage renal disease) on dialysis [facility]) She tolerated dialysis well She will continue the same  Nausea in adult She has severe, uncontrolled nausea and vomiting in the office Her symptoms subsequently improved after 1 dose of Compazine and a dose of IV Zofran We will hold treatment today I have prescribed antiemetics for her to use at home We will call her next week for follow-up  Other constipation I suspect a big component of her nausea & vomiting is due to severe constipation I have extensive discussion with her son over the telephone about aggressive laxative therapy   No orders of the defined types were placed in this encounter.   INTERVAL HISTORY: Please see below for problem oriented charting. She returns to continue chemotherapy after recent hospital discharge Upon check-in to the cancer center, the patient started to have severe dry heaving, nausea and vomiting The patient has poor oral intake for the last few days since discharge from the hospital She also have significant constipation She does not recall when she had a good bowel movement.  She had some passage of loose watery output this morning She denies pain She told me she tolerated dialysis well yesterday but did not eat much overall She  denies fever or chills The patient denies any recent signs or symptoms of bleeding such as spontaneous epistaxis, hematuria or hematochezia. I collaborated the history with the son as well SUMMARY OF ONCOLOGIC HISTORY:   Multiple myeloma not having achieved remission ([place])   03/03/2019 Initial Diagnosis    Multiple myeloma not having achieved remission ([place])    03/03/2019 Imaging    1. Round and oval lucent/lytic lesions in the skull, bilateral humeri, distal left femur, and possibly also in the pelvis are compatible with multiple myeloma. No right lower extremity lytic lesion identified. No pathologic fracture. 2. Advanced degenerative changes in the spine with no vertebral myeloma evident by plain radiography. 3. Advanced degenerative changes in the right upper extremity. Bilateral knee arthroplasty. 4.  Aortic Atherosclerosis (ICD10-I70.0).    03/04/2019 -  Chemotherapy    The patient had bortezomib for chemotherapy treatment.     03/04/2019 Bone Marrow Biopsy    Bone Marrow, Aspirate,Biopsy, and Clot - HYPERCELLULAR BONE MARROW FOR AGE WITH PLASMACYTOSIS. - SEE COMMENT. PERIPHERAL BLOOD: - NORMOCYTIC-NORMOCHROMIC ANEMIA. Diagnosis Note The bone marrow is hypercellular for age with increased number of atypical plasma cells averaging 30% of all cells in the aspirate associated with interstitial infiltrates and numerous variably sized aggregates in the clot and biopsy sections. 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 Confirmatory in situ hybridization for kappa and lambda as well as CD138 will be performed and the results reported in an addendum. The background shows trilineage hematopoiesis with mild non-specific changes primarily involving the erythroid series. Correlation with cytogenetic and FISH studies is recommended.  REVIEW OF SYSTEMS:   Constitutional: Denies fevers, chills or abnormal weight loss Eyes: Denies blurriness of vision Ears, nose, mouth,  throat, and face: Denies mucositis or sore throat Respiratory: Denies cough, dyspnea or wheezes Cardiovascular: Denies palpitation, chest discomfort or lower extremity swelling Skin: Denies abnormal skin rashes Lymphatics: Denies new lymphadenopathy or easy bruising Neurological:Denies numbness, tingling or new weaknesses Behavioral/Psych: Mood is stable, no new changes  All other systems were reviewed with the patient and are negative.  I have reviewed the past medical history, past surgical history, social history and family history with the patient and they are unchanged from previous note.  ALLERGIES:  is allergic to aspirin; penicillins; brimonidine; diltiazem hcl; hydrocodone-acetaminophen; morphine; and neurontin [gabapentin].  MEDICATIONS:  Current Outpatient Medications  Medication Sig Dispense Refill  . allopurinol (ZYLOPRIM) 100 MG tablet Take 0.5 tablets (50 mg total) by mouth daily. 30 tablet 0  . Coenzyme Q10 (CO Q 10 PO) Take 1 tablet by mouth daily.     . dorzolamide (TRUSOPT) 2 % ophthalmic solution Place 1 drop into the left eye every evening.   1  . fluorometholone (FML) 0.1 % ophthalmic suspension Place 1 drop into the right eye 3 (three) times daily.     Marland Kitchen glimepiride (AMARYL) 1 MG tablet Take 0.5 mg by mouth 2 (two) times daily.     . isosorbide-hydrALAZINE (BIDIL) 20-37.5 MG tablet Take 1 tablet by mouth 2 (two) times a day. 60 tablet 0  . LUMIGAN 0.01 % SOLN Place 1 drop into the left eye every evening.     Marland Kitchen LYRICA 50 MG capsule Take 50 mg by mouth daily.     . Multiple Vitamin (MULTIVITAMIN WITH MINERALS) TABS tablet Take 1 tablet by mouth daily.    . ondansetron (ZOFRAN) 4 MG tablet Take 1 tablet (4 mg total) by mouth every 8 (eight) hours as needed for nausea. 30 tablet 1  . oxyCODONE-acetaminophen (PERCOCET/ROXICET) 5-325 MG tablet Take 1 tablet by mouth every 6 (six) hours as needed for moderate pain. 30 tablet 0  . pantoprazole (PROTONIX) 40 MG tablet Take 40  mg by mouth daily.    . polyethylene glycol (MIRALAX / GLYCOLAX) 17 g packet Take 5.6 g by mouth at bedtime.    . prochlorperazine (COMPAZINE) 5 MG tablet Take 1 tablet (5 mg total) by mouth every 6 (six) hours as needed for nausea or vomiting. 30 tablet 1  . senna-docusate (SENOKOT-S) 8.6-50 MG tablet Take 2 tablets by mouth 2 (two) times daily. 30 tablet 0  . sevelamer carbonate (RENVELA) 2.4 g PACK Take 2.4 g by mouth 3 (three) times daily with meals. 90 each 0  . sodium chloride (OCEAN) 0.65 % SOLN nasal spray Place 1 spray into both nostrils as needed for congestion.     No current facility-administered medications for this visit.     PHYSICAL EXAMINATION: ECOG PERFORMANCE STATUS: 3 - Symptomatic, >50% confined to bed  Vitals:   03/14/19 0955  BP: 140/60  Pulse: 69  Resp: 17  Temp: 98.9 F (37.2 C)  SpO2: 100%   There were no vitals filed for this visit. I check on the patient several times over the span of an hour due to her symptoms. GENERAL:alert, in significant distress from nausea and vomiting.  She looks debilitated sitting on the wheelchair SKIN: skin color, texture, turgor are normal, no rashes or significant lesions EYES: normal, Conjunctiva are pink and non-injected, sclera clear OROPHARYNX:no exudate, no erythema and lips, buccal mucosa, and  tongue normal  NECK: supple, thyroid normal size, non-tender, without nodularity LYMPH:  no palpable lymphadenopathy in the cervical, axillary or inguinal LUNGS: clear to auscultation and percussion with normal breathing effort HEART: regular rate & rhythm and no murmurs and no lower extremity edema ABDOMEN:abdomen soft, non-tender and normal bowel sounds Musculoskeletal:no cyanosis of digits and no clubbing  NEURO: alert & oriented x 3 with fluent speech, no focal motor/sensory deficits  LABORATORY DATA:  I have reviewed the data as listed    Component Value Date/Time   NA 132 (L) 03/14/2019 0915   K 3.5 03/14/2019 0915    CL 96 (L) 03/14/2019 0915   CO2 26 03/14/2019 0915   GLUCOSE 85 03/14/2019 0915   BUN 14 03/14/2019 0915   CREATININE 3.89 (HH) 03/14/2019 0915   CREATININE 1.03 (H) 05/11/2016 0931   CALCIUM 7.9 (L) 03/14/2019 0915   PROT 10.3 (H) 03/14/2019 0915   ALBUMIN 2.3 (L) 03/14/2019 0915   AST 17 03/14/2019 0915   ALT 13 03/14/2019 0915   ALKPHOS 35 (L) 03/14/2019 0915   BILITOT 0.6 03/14/2019 0915   GFRNONAA 10 (L) 03/14/2019 0915   GFRAA 11 (L) 03/14/2019 0915    No results found for: SPEP, UPEP  Lab Results  Component Value Date   WBC 6.2 03/14/2019   NEUTROABS 4.5 03/14/2019   HGB 7.9 (L) 03/14/2019   HCT 25.1 (L) 03/14/2019   MCV 89.6 03/14/2019   PLT 147 (L) 03/14/2019      Chemistry      Component Value Date/Time   NA 132 (L) 03/14/2019 0915   K 3.5 03/14/2019 0915   CL 96 (L) 03/14/2019 0915   CO2 26 03/14/2019 0915   BUN 14 03/14/2019 0915   CREATININE 3.89 (HH) 03/14/2019 0915   CREATININE 1.03 (H) 05/11/2016 0931      Component Value Date/Time   CALCIUM 7.9 (L) 03/14/2019 0915   ALKPHOS 35 (L) 03/14/2019 0915   AST 17 03/14/2019 0915   ALT 13 03/14/2019 0915   BILITOT 0.6 03/14/2019 0915       RADIOGRAPHIC STUDIES: I have personally reviewed the radiological images as listed and agreed with the findings in the report. Dg Chest 1 View  Result Date: 02/25/2019 CLINICAL DATA:  Acute kidney injury and cough. EXAM: CHEST  1 VIEW COMPARISON:  None. FINDINGS: Left chest wall pacer device is noted with leads in the right atrial appendage and right ventricle. Mild cardiac enlargement. Aortic atherosclerosis. No pleural effusion or edema. No airspace opacities. IMPRESSION: 1. Cardiac enlargement.  No acute findings. 2.  Aortic Atherosclerosis (ICD10-I70.0). Electronically Signed   By: Kerby Moors M.D.   On: 02/25/2019 13:51   Dg Chest 2 View  Result Date: 03/01/2019 CLINICAL DATA:  Hypertension.  Diabetes. EXAM: CHEST - 2 VIEW COMPARISON:  Feb 28, 2019  FINDINGS: Stable cardiomegaly. The hila and mediastinum are unremarkable. Mild atelectasis in the left base. Mild patchy opacity in the right lung, unchanged. No other acute changes. IMPRESSION: Stable patchy opacities in the right lung. No other interval changes. Electronically Signed   By: Dorise Bullion III M.D   On: 03/01/2019 17:04   US Renal  Result Date: 02/25/2019 CLINICAL DATA:  85 year old female with acute renal failure EXAM: RENAL / URINARY TRACT ULTRASOUND COMPLETE COMPARISON:  None. FINDINGS: Right Kidney: Renal measurements: 11.9 x 4.1 x 4.3 cm = volume: 111 mL . Echogenicity within normal limits. No mass or hydronephrosis visualized. Left Kidney: Renal measurements: 12.5 x 4.2 x  4.2 cm = volume: 115 mL. Two renal cysts are present measuring 1.2 cm. Echogenicity within normal limits. No mass or hydronephrosis visualized. Bladder: Appears normal for degree of bladder distention, but bladder not well distended. IMPRESSION: 1. Unremarkable kidneys except for 2 small LEFT renal cysts. No evidence of hydronephrosis. Electronically Signed   By: Margarette Canada M.D.   On: 02/25/2019 16:00   Ir Fluoro Guide Cv Line Right  Result Date: 03/07/2019 INDICATION: 19 year old with acute kidney injury.  Need for hemodialysis. EXAM: FLUOROSCOPIC AND ULTRASOUND GUIDED PLACEMENT OF A TUNNELED DIALYSIS CATHETER Physician: Stephan Minister. Anselm Pancoast, MD MEDICATIONS: Vancomycin 1 gm IV; The antibiotic was administered within an appropriate time interval prior to skin puncture. ANESTHESIA/SEDATION: Versed 1.0 mg IV; Fentanyl 50 mcg IV; Moderate Sedation Time:  22 minutes The patient was continuously monitored during the procedure by the interventional radiology nurse under my direct supervision. FLUOROSCOPY TIME:  Fluoroscopy Time: 30 seconds, 2 mGy COMPLICATIONS: None immediate. PROCEDURE: Informed consent was obtained for placement of a tunneled dialysis catheter. The patient was placed supine on the interventional table.  Ultrasound confirmed a patent right internal jugular vein. Ultrasound images were obtained for documentation. The right side of the neck and chest was prepped and draped in a sterile fashion. The right neck was anesthetized with 1% lidocaine. Maximal barrier sterile technique was utilized including caps, mask, sterile gowns, sterile gloves, sterile drape, hand hygiene and skin antiseptic. A small incision was made with #11 blade scalpel. A 21 gauge needle directed into the right internal jugular vein with ultrasound guidance. A micropuncture dilator set was placed. A 19 cm tip to cuff Palindrome catheter was selected. The skin below the right clavicle was anesthetized and a small incision was made with an #11 blade scalpel. A subcutaneous tunnel was formed to the vein dermatotomy site. The catheter was brought through the tunnel. The vein dermatotomy site was dilated to accommodate a peel-away sheath. The catheter was placed through the peel-away sheath and directed into the central venous structures. The tip of the catheter was placed at the superior cavoatrial junction with fluoroscopy. Fluoroscopic images were obtained for documentation. Both lumens were found to aspirate and flush well. The proper amount of heparin was flushed in both lumens. The vein dermatotomy site was closed using a single layer of absorbable suture and Dermabond. Gel-Foam was placed in the subcutaneous tract for hemostasis. The catheter was secured to the skin using Prolene suture. IMPRESSION: Successful placement of a right jugular tunneled dialysis catheter using ultrasound and fluoroscopic guidance. Electronically Signed   By: Markus Daft M.D.   On: 03/07/2019 10:45   Ir US Guide Vasc Access Right  Result Date: 03/07/2019 INDICATION: 76 year old with acute kidney injury.  Need for hemodialysis. EXAM: FLUOROSCOPIC AND ULTRASOUND GUIDED PLACEMENT OF A TUNNELED DIALYSIS CATHETER Physician: Stephan Minister. Anselm Pancoast, MD MEDICATIONS: Vancomycin 1 gm  IV; The antibiotic was administered within an appropriate time interval prior to skin puncture. ANESTHESIA/SEDATION: Versed 1.0 mg IV; Fentanyl 50 mcg IV; Moderate Sedation Time:  22 minutes The patient was continuously monitored during the procedure by the interventional radiology nurse under my direct supervision. FLUOROSCOPY TIME:  Fluoroscopy Time: 30 seconds, 2 mGy COMPLICATIONS: None immediate. PROCEDURE: Informed consent was obtained for placement of a tunneled dialysis catheter. The patient was placed supine on the interventional table. Ultrasound confirmed a patent right internal jugular vein. Ultrasound images were obtained for documentation. The right side of the neck and chest was prepped and draped in a sterile fashion.  The right neck was anesthetized with 1% lidocaine. Maximal barrier sterile technique was utilized including caps, mask, sterile gowns, sterile gloves, sterile drape, hand hygiene and skin antiseptic. A small incision was made with #11 blade scalpel. A 21 gauge needle directed into the right internal jugular vein with ultrasound guidance. A micropuncture dilator set was placed. A 19 cm tip to cuff Palindrome catheter was selected. The skin below the right clavicle was anesthetized and a small incision was made with an #11 blade scalpel. A subcutaneous tunnel was formed to the vein dermatotomy site. The catheter was brought through the tunnel. The vein dermatotomy site was dilated to accommodate a peel-away sheath. The catheter was placed through the peel-away sheath and directed into the central venous structures. The tip of the catheter was placed at the superior cavoatrial junction with fluoroscopy. Fluoroscopic images were obtained for documentation. Both lumens were found to aspirate and flush well. The proper amount of heparin was flushed in both lumens. The vein dermatotomy site was closed using a single layer of absorbable suture and Dermabond. Gel-Foam was placed in the  subcutaneous tract for hemostasis. The catheter was secured to the skin using Prolene suture. IMPRESSION: Successful placement of a right jugular tunneled dialysis catheter using ultrasound and fluoroscopic guidance. Electronically Signed   By: Markus Daft M.D.   On: 03/07/2019 10:45   Dg Chest Port 1 View  Result Date: 02/28/2019 CLINICAL DATA:  Dyspnea. EXAM: PORTABLE CHEST 1 VIEW COMPARISON:  Radiograph of Feb 25, 2019. FINDINGS: Stable cardiomegaly. Atherosclerosis of thoracic aorta is noted. Left-sided pacemaker is unchanged in position. No pneumothorax or pleural effusion is noted. Left lung is clear. Mildly increased right lung opacities are noted concerning for either asymmetric edema or possibly pneumonia. Severe degenerative changes seen involving the right glenohumeral joint. IMPRESSION: Mildly increased right lung opacities are noted concerning for possible asymmetric edema or possibly pneumonia. Aortic Atherosclerosis (ICD10-I70.0). Electronically Signed   By: Marijo Conception M.D.   On: 02/28/2019 12:26   Dg Bone Survey Met  Result Date: 03/03/2019 CLINICAL DATA:  85 year old female with multiple myeloma, pain radiating through the right leg. EXAM: METASTATIC BONE SURVEY COMPARISON:  CT lumbar myelogram 06/19/2018 FINDINGS: Scattered round lucent lesions in the skull. Degenerative changes in the cervical spine where bone mineralization appears within normal limits. Degenerative changes in the thoracic spine and lumbar spine. No spinal compression fracture identified. Spine bone mineralization appears within normal limits. Previous lower lumbar interbody fusion. Calcified aortic atherosclerosis. Questionable heterogeneous lucency in the iliac wings. Femoral heads are normally located and hip joint spaces are normal for age. Pubic rami appear intact. No definite lytic lesion in the right lower extremity. Total right knee arthroplasty. There is an oval lytic lesion in the distal left femur measuring  11 millimeters (arrow). Prior left knee arthroplasty also. Elsewhere in the left lower extremity bone mineralization appears normal for age. Advanced degenerative changes at the right shoulder and wrist. There are lucent lesions in the distal right humerus. Cardiomegaly with left chest cardiac pacemaker. No discrete rib lesion identified. Small lucent lesions in the mid and distal humerus. Mild to moderate degenerative changes in the left upper extremity. No pathologic fracture identified. IMPRESSION: 1. Round and oval lucent/lytic lesions in the skull, bilateral humeri, distal left femur, and possibly also in the pelvis are compatible with multiple myeloma. No right lower extremity lytic lesion identified. No pathologic fracture. 2. Advanced degenerative changes in the spine with no vertebral myeloma evident by plain radiography.  3. Advanced degenerative changes in the right upper extremity. Bilateral knee arthroplasty. 4.  Aortic Atherosclerosis (ICD10-I70.0). Electronically Signed   By: Genevie Ann M.D.   On: 03/03/2019 19:59   Vas [place] Upper Extremity Arterial Duplex  Result Date: 03/08/2019 UPPER EXTREMITY DUPLEX STUDY Indications: Pre operative for dialysis access.  Comparison Study: No prior study Performing Technologist: Sharion Dove RVS  Examination Guidelines: A complete evaluation includes B-mode imaging, spectral Doppler, color Doppler, and power Doppler as needed of all accessible portions of each vessel. Bilateral testing is considered an integral part of a complete examination. Limited examinations for reoccurring indications may be performed as noted.  Right Doppler Findings: +--------+----------+---------+-----------+--------+ Site    PSV (cm/s)Waveform Plaque     Comments +--------+----------+---------+-----------+--------+ [id number]       triphasicno stenosis         +--------+----------+---------+-----------+--------+ Radial  83        triphasicno stenosis          +--------+----------+---------+-----------+--------+ Ulnar   68        triphasicno stenosistortuous +--------+----------+---------+-----------+--------+ Right Pre-Dialysis Findings: +-----------------------+----------+--------------------+---------+--------+ Location               PSV (cm/s)Intralum. Diam. (cm)Waveform Comments +-----------------------+----------+--------------------+---------+--------+ Brachial Antecub. fossa          0.60                triphasic         +-----------------------+----------+--------------------+---------+--------+ Radial Art at Wrist              0.33                triphasic         +-----------------------+----------+--------------------+---------+--------+ Ulnar Art at Wrist               0.32                triphasic         +-----------------------+----------+--------------------+---------+--------+ Left Doppler Findings: +--------+----------+---------+-----------+--------+ Site    PSV (cm/s)Waveform Plaque     Comments +--------+----------+---------+-----------+--------+ [id number]        triphasicno stenosis         +--------+----------+---------+-----------+--------+ Radial  182       triphasicno stenosis         +--------+----------+---------+-----------+--------+ Ulnar   133       triphasicno stenosis         +--------+----------+---------+-----------+--------+ Left Pre-Dialysis Findings: +-----------------------+----------+--------------------+---------+--------+ Location               PSV (cm/s)Intralum. Diam. (cm)Waveform Comments +-----------------------+----------+--------------------+---------+--------+ Brachial Antecub. fossa          0.65                triphasic         +-----------------------+----------+--------------------+---------+--------+ Radial Art at Wrist              0.33                triphasic         +-----------------------+----------+--------------------+---------+--------+  Ulnar Art at Wrist               0.22                triphasic         +-----------------------+----------+--------------------+---------+--------+  Summary:  Right: No obstruction visualized in the right upper extremity. Left: No obstruction visualized in the left upper extremity. *See table(s) above for measurements and observations. Electronically signed by [name] MD on 03/08/2019 at 5:49:17 PM.  Final    Vas [place] Upper Ext Vein Mapping (pre-op Avf)  Result Date: 03/08/2019 UPPER EXTREMITY VEIN MAPPING  Indications: Pre-dialysis access. Limitations: Bandages, IV lines Comparison Study: No prior study on file for comparison Performing Technologist: Sharion Dove RVS  Examination Guidelines: A complete evaluation includes B-mode imaging, spectral Doppler, color Doppler, and power Doppler as needed of all accessible portions of each vessel. Bilateral testing is considered an integral part of a complete examination. Limited examinations for reoccurring indications may be performed as noted. +-----------------+-------------+----------+---------+ Right Cephalic   Diameter (cm)Depth (cm)Findings  +-----------------+-------------+----------+---------+ Prox upper arm       0.50        0.44             +-----------------+-------------+----------+---------+ Mid upper arm        0.58        0.48             +-----------------+-------------+----------+---------+ Dist upper arm       0.60        0.46             +-----------------+-------------+----------+---------+ Antecubital fossa    0.70        0.42             +-----------------+-------------+----------+---------+ Prox forearm         0.42        0.36   branching +-----------------+-------------+----------+---------+ Mid forearm          0.36        0.55   branching +-----------------+-------------+----------+---------+ Wrist                0.29        0.81              +-----------------+-------------+----------+---------+ +--------------+-------------+----------+---------+ Right Basilic Diameter (cm)Depth (cm)Findings  +--------------+-------------+----------+---------+ Dist upper arm                       branching +--------------+-------------+----------+---------+ +-----------------+-------------+----------+--------------+ Left Cephalic    Diameter (cm)Depth (cm)   Findings    +-----------------+-------------+----------+--------------+ Prox upper arm                          not visualized +-----------------+-------------+----------+--------------+ Mid upper arm        0.41        0.66                  +-----------------+-------------+----------+--------------+ Dist upper arm       0.37        0.59     branching    +-----------------+-------------+----------+--------------+ Antecubital fossa    0.53        0.67                  +-----------------+-------------+----------+--------------+ Prox forearm         0.21        0.24                  +-----------------+-------------+----------+--------------+ Mid forearm          0.12        0.29                  +-----------------+-------------+----------+--------------+ Wrist                0.19        0.33                  +-----------------+-------------+----------+--------------+ +-----------------+-------------+----------+----------------------------+  Left Basilic     Diameter (cm)Depth (cm)          Findings           +-----------------+-------------+----------+----------------------------+ Prox upper arm                                 not visualized        +-----------------+-------------+----------+----------------------------+ Mid upper arm        0.49        2.24              origin            +-----------------+-------------+----------+----------------------------+ Dist upper arm       0.52        2.34            branching            +-----------------+-------------+----------+----------------------------+ Antecubital fossa    0.30        0.96                                +-----------------+-------------+----------+----------------------------+ Prox forearm                                   not visualized        +-----------------+-------------+----------+----------------------------+ Mid forearm                             branching and not visualized +-----------------+-------------+----------+----------------------------+ Wrist                                          not visualized        +-----------------+-------------+----------+----------------------------+ *See table(s) above for measurements and observations.  Diagnosing physician: [name] MD Electronically signed by [name] MD on 03/08/2019 at 5:50:00 PM.    Final     All questions were answered. The patient knows to call the clinic with any problems, questions or concerns. No barriers to learning was detected.  I spent 40 minutes counseling the patient face to face. The total time spent in the appointment was 55 minutes and more than 50% was on counseling and review of test results  Heath Lark, MD 03/14/2019 12:55 PM

## 2019-03-14 NOTE — Assessment & Plan Note (Signed)
Unfortunately, due to uncontrolled nausea, vomiting and constipation and feeling unwell, we will hold treatment today Her symptoms has improved with antiemetics I will see her again next week for further follow-up I will contact her son again next week to follow on her symptoms.

## 2019-03-14 NOTE — Telephone Encounter (Signed)
Scheduled appt per 5/29 sch message.  Spoke with patient son, and he is aware of appt dates and time.

## 2019-03-14 NOTE — Assessment & Plan Note (Signed)
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

## 2019-03-14 NOTE — Assessment & Plan Note (Signed)
She has severe, uncontrolled nausea and vomiting in the office Her symptoms subsequently improved after 1 dose of Compazine and a dose of IV Zofran We will hold treatment today I have prescribed antiemetics for her to use at home We will call her next week for follow-up

## 2019-03-14 NOTE — Assessment & Plan Note (Signed)
I suspect a big component of her nausea & vomiting is due to severe constipation I have extensive discussion with her son over the telephone about aggressive laxative therapy

## 2019-03-14 NOTE — Assessment & Plan Note (Signed)
She tolerated dialysis well She will continue the same

## 2019-03-15 DIAGNOSIS — N2581 Secondary hyperparathyroidism of renal origin: Secondary | ICD-10-CM | POA: Diagnosis not present

## 2019-03-15 DIAGNOSIS — E0822 Diabetes mellitus due to underlying condition with diabetic chronic kidney disease: Secondary | ICD-10-CM | POA: Diagnosis not present

## 2019-03-15 DIAGNOSIS — D689 Coagulation defect, unspecified: Secondary | ICD-10-CM | POA: Diagnosis not present

## 2019-03-15 DIAGNOSIS — D631 Anemia in chronic kidney disease: Secondary | ICD-10-CM | POA: Diagnosis not present

## 2019-03-15 DIAGNOSIS — N186 End stage renal disease: Secondary | ICD-10-CM | POA: Diagnosis not present

## 2019-03-17 ENCOUNTER — Telehealth: Payer: Self-pay

## 2019-03-17 DIAGNOSIS — E1142 Type 2 diabetes mellitus with diabetic polyneuropathy: Secondary | ICD-10-CM | POA: Diagnosis not present

## 2019-03-17 DIAGNOSIS — N186 End stage renal disease: Secondary | ICD-10-CM | POA: Diagnosis not present

## 2019-03-17 DIAGNOSIS — M1030 Gout due to renal impairment, unspecified site: Secondary | ICD-10-CM | POA: Diagnosis not present

## 2019-03-17 DIAGNOSIS — I120 Hypertensive chronic kidney disease with stage 5 chronic kidney disease or end stage renal disease: Secondary | ICD-10-CM | POA: Diagnosis not present

## 2019-03-17 DIAGNOSIS — D631 Anemia in chronic kidney disease: Secondary | ICD-10-CM | POA: Diagnosis not present

## 2019-03-17 DIAGNOSIS — D630 Anemia in neoplastic disease: Secondary | ICD-10-CM | POA: Diagnosis not present

## 2019-03-17 DIAGNOSIS — M19071 Primary osteoarthritis, right ankle and foot: Secondary | ICD-10-CM | POA: Diagnosis not present

## 2019-03-17 DIAGNOSIS — E1122 Type 2 diabetes mellitus with diabetic chronic kidney disease: Secondary | ICD-10-CM | POA: Diagnosis not present

## 2019-03-17 DIAGNOSIS — C9000 Multiple myeloma not having achieved remission: Secondary | ICD-10-CM | POA: Diagnosis not present

## 2019-03-17 DIAGNOSIS — M170 Bilateral primary osteoarthritis of knee: Secondary | ICD-10-CM | POA: Diagnosis not present

## 2019-03-17 LAB — MULTIPLE MYELOMA PANEL, SERUM
Albumin SerPl Elph-Mcnc: 3 g/dL (ref 2.9–4.4)
Albumin/Glob SerPl: 0.5 — ABNORMAL LOW (ref 0.7–1.7)
Alpha 1: 0.3 g/dL (ref 0.0–0.4)
Alpha2 Glob SerPl Elph-Mcnc: 0.9 g/dL (ref 0.4–1.0)
B-Globulin SerPl Elph-Mcnc: 4.9 g/dL — ABNORMAL HIGH (ref 0.7–1.3)
Gamma Glob SerPl Elph-Mcnc: 0.3 g/dL — ABNORMAL LOW (ref 0.4–1.8)
Globulin, Total: 6.5 g/dL — ABNORMAL HIGH (ref 2.2–3.9)
IgA: 30 mg/dL — ABNORMAL LOW (ref 64–422)
IgG (Immunoglobin G), Serum: 4939 mg/dL — ABNORMAL HIGH (ref 586–1602)
IgM (Immunoglobulin M), Srm: 11 mg/dL — ABNORMAL LOW (ref 26–217)
M Protein SerPl Elph-Mcnc: 4.5 g/dL — ABNORMAL HIGH
Total Protein ELP: 9.5 g/dL — ABNORMAL HIGH (ref 6.0–8.5)

## 2019-03-17 LAB — KAPPA/LAMBDA LIGHT CHAINS
Kappa free light chain: 6404.2 mg/L — ABNORMAL HIGH (ref 3.3–19.4)
Kappa, lambda light chain ratio: 660.23 — ABNORMAL HIGH (ref 0.26–1.65)
Lambda free light chains: 9.7 mg/L (ref 5.7–26.3)

## 2019-03-17 NOTE — Telephone Encounter (Signed)
FMLA documents received via fax for pt's grand daughter. Documents given to Thompson Caul, LPN

## 2019-03-18 DIAGNOSIS — D689 Coagulation defect, unspecified: Secondary | ICD-10-CM | POA: Diagnosis not present

## 2019-03-18 DIAGNOSIS — N186 End stage renal disease: Secondary | ICD-10-CM | POA: Diagnosis not present

## 2019-03-18 DIAGNOSIS — D631 Anemia in chronic kidney disease: Secondary | ICD-10-CM | POA: Diagnosis not present

## 2019-03-18 DIAGNOSIS — N2581 Secondary hyperparathyroidism of renal origin: Secondary | ICD-10-CM | POA: Diagnosis not present

## 2019-03-18 DIAGNOSIS — E0822 Diabetes mellitus due to underlying condition with diabetic chronic kidney disease: Secondary | ICD-10-CM | POA: Diagnosis not present

## 2019-03-19 DIAGNOSIS — E1122 Type 2 diabetes mellitus with diabetic chronic kidney disease: Secondary | ICD-10-CM | POA: Diagnosis not present

## 2019-03-19 DIAGNOSIS — D630 Anemia in neoplastic disease: Secondary | ICD-10-CM | POA: Diagnosis not present

## 2019-03-19 DIAGNOSIS — I120 Hypertensive chronic kidney disease with stage 5 chronic kidney disease or end stage renal disease: Secondary | ICD-10-CM | POA: Diagnosis not present

## 2019-03-19 DIAGNOSIS — M1030 Gout due to renal impairment, unspecified site: Secondary | ICD-10-CM | POA: Diagnosis not present

## 2019-03-19 DIAGNOSIS — C9000 Multiple myeloma not having achieved remission: Secondary | ICD-10-CM | POA: Diagnosis not present

## 2019-03-19 DIAGNOSIS — M170 Bilateral primary osteoarthritis of knee: Secondary | ICD-10-CM | POA: Diagnosis not present

## 2019-03-19 DIAGNOSIS — M19071 Primary osteoarthritis, right ankle and foot: Secondary | ICD-10-CM | POA: Diagnosis not present

## 2019-03-19 DIAGNOSIS — E1142 Type 2 diabetes mellitus with diabetic polyneuropathy: Secondary | ICD-10-CM | POA: Diagnosis not present

## 2019-03-19 DIAGNOSIS — N186 End stage renal disease: Secondary | ICD-10-CM | POA: Diagnosis not present

## 2019-03-19 DIAGNOSIS — D631 Anemia in chronic kidney disease: Secondary | ICD-10-CM | POA: Diagnosis not present

## 2019-03-20 ENCOUNTER — Telehealth: Payer: Self-pay

## 2019-03-20 ENCOUNTER — Other Ambulatory Visit: Payer: Self-pay | Admitting: Hematology and Oncology

## 2019-03-20 DIAGNOSIS — C9000 Multiple myeloma not having achieved remission: Secondary | ICD-10-CM

## 2019-03-20 MED ORDER — DEXAMETHASONE 4 MG PO TABS: 4.0000 mg | ORAL_TABLET | Freq: Every day | ORAL | 0 refills | Status: DC

## 2019-03-20 NOTE — Telephone Encounter (Signed)
Son called and left a message to call him.  Called back. His Mom is still having nausea and dry heaves. Denies vomiting. She is still taking compazine and Zofran for nausea. They would like the Rx for steroids to see if it will help with nausea that was offered earlier. They use [facility] on [address].

## 2019-03-20 NOTE — Telephone Encounter (Signed)
Called and given son below message. He verbalized understanding.

## 2019-03-20 NOTE — Telephone Encounter (Signed)
Son called and left message she was unable to do dialysis today due to nausea. They are moving dialysis to tomorrow.  Called back per Dr. Alvy Bimler, appt's canceled for tomorrow. Instructed to take Decadron daily until seen by Dr. Alvy Bimler. Appointments to be rescheduled to 6/10. Son verbalized understanding. Scheduling message sent.

## 2019-03-20 NOTE — Telephone Encounter (Signed)
Start 1 pill daily today, preferably in the morning with breakfast Watch out for high blood sugar

## 2019-03-21 ENCOUNTER — Inpatient Hospital Stay: Payer: Medicare HMO | Admitting: Hematology and Oncology

## 2019-03-21 ENCOUNTER — Inpatient Hospital Stay: Payer: Medicare HMO

## 2019-03-21 DIAGNOSIS — E1122 Type 2 diabetes mellitus with diabetic chronic kidney disease: Secondary | ICD-10-CM | POA: Diagnosis not present

## 2019-03-21 DIAGNOSIS — M170 Bilateral primary osteoarthritis of knee: Secondary | ICD-10-CM | POA: Diagnosis not present

## 2019-03-21 DIAGNOSIS — C9000 Multiple myeloma not having achieved remission: Secondary | ICD-10-CM | POA: Diagnosis not present

## 2019-03-21 DIAGNOSIS — D630 Anemia in neoplastic disease: Secondary | ICD-10-CM | POA: Diagnosis not present

## 2019-03-21 DIAGNOSIS — D631 Anemia in chronic kidney disease: Secondary | ICD-10-CM | POA: Diagnosis not present

## 2019-03-21 DIAGNOSIS — M19071 Primary osteoarthritis, right ankle and foot: Secondary | ICD-10-CM | POA: Diagnosis not present

## 2019-03-21 DIAGNOSIS — N186 End stage renal disease: Secondary | ICD-10-CM | POA: Diagnosis not present

## 2019-03-21 DIAGNOSIS — I120 Hypertensive chronic kidney disease with stage 5 chronic kidney disease or end stage renal disease: Secondary | ICD-10-CM | POA: Diagnosis not present

## 2019-03-21 DIAGNOSIS — M1030 Gout due to renal impairment, unspecified site: Secondary | ICD-10-CM | POA: Diagnosis not present

## 2019-03-21 DIAGNOSIS — E1142 Type 2 diabetes mellitus with diabetic polyneuropathy: Secondary | ICD-10-CM | POA: Diagnosis not present

## 2019-03-24 ENCOUNTER — Telehealth: Payer: Self-pay | Admitting: Hematology and Oncology

## 2019-03-24 DIAGNOSIS — D630 Anemia in neoplastic disease: Secondary | ICD-10-CM | POA: Diagnosis not present

## 2019-03-24 DIAGNOSIS — E1122 Type 2 diabetes mellitus with diabetic chronic kidney disease: Secondary | ICD-10-CM | POA: Diagnosis not present

## 2019-03-24 DIAGNOSIS — M1030 Gout due to renal impairment, unspecified site: Secondary | ICD-10-CM | POA: Diagnosis not present

## 2019-03-24 DIAGNOSIS — M170 Bilateral primary osteoarthritis of knee: Secondary | ICD-10-CM | POA: Diagnosis not present

## 2019-03-24 DIAGNOSIS — E1142 Type 2 diabetes mellitus with diabetic polyneuropathy: Secondary | ICD-10-CM | POA: Diagnosis not present

## 2019-03-24 DIAGNOSIS — D631 Anemia in chronic kidney disease: Secondary | ICD-10-CM | POA: Diagnosis not present

## 2019-03-24 DIAGNOSIS — E440 Moderate protein-calorie malnutrition: Secondary | ICD-10-CM | POA: Insufficient documentation

## 2019-03-24 DIAGNOSIS — N186 End stage renal disease: Secondary | ICD-10-CM | POA: Diagnosis not present

## 2019-03-24 DIAGNOSIS — M19071 Primary osteoarthritis, right ankle and foot: Secondary | ICD-10-CM | POA: Diagnosis not present

## 2019-03-24 DIAGNOSIS — I120 Hypertensive chronic kidney disease with stage 5 chronic kidney disease or end stage renal disease: Secondary | ICD-10-CM | POA: Diagnosis not present

## 2019-03-24 DIAGNOSIS — C9000 Multiple myeloma not having achieved remission: Secondary | ICD-10-CM | POA: Diagnosis not present

## 2019-03-24 NOTE — Telephone Encounter (Signed)
Moved 6/12 appointments to 6/10 per 6/4 schedule message. confirmed with provider/desk nurse 6/12 should be cancelled and patient scheduled for 6/10.   Per NG she will adjust schedule once she sees patient 6/10. Confirmed with patient. Son present at time of call and is ok with 6/10.

## 2019-03-26 ENCOUNTER — Encounter: Payer: Self-pay | Admitting: Hematology and Oncology

## 2019-03-26 ENCOUNTER — Inpatient Hospital Stay (HOSPITAL_BASED_OUTPATIENT_CLINIC_OR_DEPARTMENT_OTHER): Payer: Medicare HMO | Admitting: Hematology and Oncology

## 2019-03-26 ENCOUNTER — Other Ambulatory Visit: Payer: Self-pay

## 2019-03-26 ENCOUNTER — Inpatient Hospital Stay: Payer: Medicare HMO

## 2019-03-26 ENCOUNTER — Inpatient Hospital Stay: Payer: Medicare HMO | Attending: Hematology and Oncology

## 2019-03-26 ENCOUNTER — Other Ambulatory Visit: Payer: Self-pay | Admitting: Hematology and Oncology

## 2019-03-26 VITALS — BP 170/61 | HR 90 | Temp 98.2°F | Resp 18 | Ht 65.0 in | Wt 169.1 lb

## 2019-03-26 DIAGNOSIS — D630 Anemia in neoplastic disease: Secondary | ICD-10-CM | POA: Diagnosis not present

## 2019-03-26 DIAGNOSIS — R110 Nausea: Secondary | ICD-10-CM

## 2019-03-26 DIAGNOSIS — R918 Other nonspecific abnormal finding of lung field: Secondary | ICD-10-CM | POA: Insufficient documentation

## 2019-03-26 DIAGNOSIS — E1122 Type 2 diabetes mellitus with diabetic chronic kidney disease: Secondary | ICD-10-CM | POA: Insufficient documentation

## 2019-03-26 DIAGNOSIS — C9000 Multiple myeloma not having achieved remission: Secondary | ICD-10-CM | POA: Insufficient documentation

## 2019-03-26 DIAGNOSIS — Z7984 Long term (current) use of oral hypoglycemic drugs: Secondary | ICD-10-CM | POA: Diagnosis not present

## 2019-03-26 DIAGNOSIS — G893 Neoplasm related pain (acute) (chronic): Secondary | ICD-10-CM | POA: Diagnosis not present

## 2019-03-26 DIAGNOSIS — Z79899 Other long term (current) drug therapy: Secondary | ICD-10-CM

## 2019-03-26 DIAGNOSIS — I700 Atherosclerosis of aorta: Secondary | ICD-10-CM | POA: Insufficient documentation

## 2019-03-26 DIAGNOSIS — B372 Candidiasis of skin and nail: Secondary | ICD-10-CM | POA: Diagnosis not present

## 2019-03-26 DIAGNOSIS — K5900 Constipation, unspecified: Secondary | ICD-10-CM

## 2019-03-26 DIAGNOSIS — N186 End stage renal disease: Secondary | ICD-10-CM | POA: Insufficient documentation

## 2019-03-26 DIAGNOSIS — Z992 Dependence on renal dialysis: Secondary | ICD-10-CM

## 2019-03-26 DIAGNOSIS — M19071 Primary osteoarthritis, right ankle and foot: Secondary | ICD-10-CM | POA: Diagnosis not present

## 2019-03-26 DIAGNOSIS — M170 Bilateral primary osteoarthritis of knee: Secondary | ICD-10-CM | POA: Diagnosis not present

## 2019-03-26 DIAGNOSIS — Z5111 Encounter for antineoplastic chemotherapy: Secondary | ICD-10-CM

## 2019-03-26 DIAGNOSIS — D61818 Other pancytopenia: Secondary | ICD-10-CM

## 2019-03-26 DIAGNOSIS — K5909 Other constipation: Secondary | ICD-10-CM

## 2019-03-26 DIAGNOSIS — I120 Hypertensive chronic kidney disease with stage 5 chronic kidney disease or end stage renal disease: Secondary | ICD-10-CM | POA: Diagnosis not present

## 2019-03-26 DIAGNOSIS — E1142 Type 2 diabetes mellitus with diabetic polyneuropathy: Secondary | ICD-10-CM | POA: Diagnosis not present

## 2019-03-26 DIAGNOSIS — D631 Anemia in chronic kidney disease: Secondary | ICD-10-CM | POA: Diagnosis not present

## 2019-03-26 DIAGNOSIS — M1030 Gout due to renal impairment, unspecified site: Secondary | ICD-10-CM | POA: Diagnosis not present

## 2019-03-26 LAB — CBC WITH DIFFERENTIAL/PLATELET
Abs Immature Granulocytes: 0.03 10*3/uL (ref 0.00–0.07)
Basophils Absolute: 0 10*3/uL (ref 0.0–0.1)
Basophils Relative: 1 %
Eosinophils Absolute: 0.1 10*3/uL (ref 0.0–0.5)
Eosinophils Relative: 3 %
HCT: 25 % — ABNORMAL LOW (ref 36.0–46.0)
Hemoglobin: 7.7 g/dL — ABNORMAL LOW (ref 12.0–15.0)
Immature Granulocytes: 1 %
Lymphocytes Relative: 29 %
Lymphs Abs: 1.3 10*3/uL (ref 0.7–4.0)
MCH: 28.1 pg (ref 26.0–34.0)
MCHC: 30.8 g/dL (ref 30.0–36.0)
MCV: 91.2 fL (ref 80.0–100.0)
Monocytes Absolute: 0.7 10*3/uL (ref 0.1–1.0)
Monocytes Relative: 16 %
Neutro Abs: 2.2 10*3/uL (ref 1.7–7.7)
Neutrophils Relative %: 50 %
Platelets: 234 10*3/uL (ref 150–400)
RBC: 2.74 MIL/uL — ABNORMAL LOW (ref 3.87–5.11)
RDW: 17.6 % — ABNORMAL HIGH (ref 11.5–15.5)
WBC: 4.3 10*3/uL (ref 4.0–10.5)
nRBC: 0 % (ref 0.0–0.2)

## 2019-03-26 LAB — COMPREHENSIVE METABOLIC PANEL
ALT: 14 U/L (ref 0–44)
AST: 24 U/L (ref 15–41)
Albumin: 2.3 g/dL — ABNORMAL LOW (ref 3.5–5.0)
Alkaline Phosphatase: 34 U/L — ABNORMAL LOW (ref 38–126)
Anion gap: 14 (ref 5–15)
BUN: 7 mg/dL — ABNORMAL LOW (ref 8–23)
CO2: 28 mmol/L (ref 22–32)
Calcium: 7.5 mg/dL — ABNORMAL LOW (ref 8.9–10.3)
Chloride: 94 mmol/L — ABNORMAL LOW (ref 98–111)
Creatinine, Ser: 3.95 mg/dL (ref 0.44–1.00)
GFR calc Af Amer: 11 mL/min — ABNORMAL LOW (ref >60)
GFR calc non Af Amer: 10 mL/min — ABNORMAL LOW (ref >60)
Glucose, Bld: 84 mg/dL (ref 70–99)
Potassium: 3.1 mmol/L — ABNORMAL LOW (ref 3.5–5.1)
Sodium: 136 mmol/L (ref 135–145)
Total Bilirubin: 0.6 mg/dL (ref 0.3–1.2)
Total Protein: 10.5 g/dL — ABNORMAL HIGH (ref 6.5–8.1)

## 2019-03-26 LAB — SAMPLE TO BLOOD BANK

## 2019-03-26 MED ORDER — HYDROCODONE-ACETAMINOPHEN 5-325 MG PO TABS: 1.0000 | ORAL_TABLET | Freq: Four times a day (QID) | ORAL | 0 refills | Status: DC | PRN

## 2019-03-26 MED ORDER — BORTEZOMIB CHEMO SQ INJECTION 3.5 MG (2.5MG/ML)
1.0400 mg/m2 | Freq: Once | INTRAMUSCULAR | Status: AC
  Administered 2019-03-26: 2 mg via SUBCUTANEOUS
  Filled 2019-03-26: qty 0.8

## 2019-03-26 MED ORDER — PROCHLORPERAZINE MALEATE 10 MG PO TABS
ORAL_TABLET | ORAL | Status: AC
  Filled 2019-03-26: qty 1

## 2019-03-26 MED ORDER — PROCHLORPERAZINE MALEATE 10 MG PO TABS
10.0000 mg | ORAL_TABLET | Freq: Once | ORAL | Status: AC
  Administered 2019-03-26: 10 mg via ORAL

## 2019-03-26 MED ORDER — DEXAMETHASONE 4 MG PO TABS
ORAL_TABLET | ORAL | Status: AC
  Filled 2019-03-26: qty 2

## 2019-03-26 NOTE — Assessment & Plan Note (Signed)
She is still battling with severe constipation I recommend aggressive laxative therapy

## 2019-03-26 NOTE — Assessment & Plan Note (Signed)
Overall, her performance status has improved somewhat since last time I saw her We will proceed with reduced dose weekly Velcade along with daily dexamethasone I will see her next week again for further follow-up and supportive care The plan would be to continue minimum 3 months of treatment I anticipate she would have greater than partial response before we consider either adding Revlimid or switching her to maintenance treatment

## 2019-03-26 NOTE — Assessment & Plan Note (Signed)
She is not symptomatic and does not need blood transfusion I would defer to nephrologist for anemia management

## 2019-03-26 NOTE — Assessment & Plan Note (Signed)
She will continue hemodialysis on Tuesdays, Thursdays and Saturdays

## 2019-03-26 NOTE — Assessment & Plan Note (Signed)
Her bone pain has improved with daily dexamethasone Have refilled her prescription today We discussed narcotic refill policy

## 2019-03-26 NOTE — Progress Notes (Signed)
[place] OFFICE PROGRESS NOTE  Patient Care Team: Lucianne Lei, MD as PCP - General (Family Medicine)  ASSESSMENT & PLAN:  Multiple myeloma not having achieved remission ([place]) Overall, her performance status has improved somewhat since last time I saw her We will proceed with reduced dose weekly Velcade along with daily dexamethasone I will see her next week again for further follow-up and supportive care The plan would be to continue minimum 3 months of treatment I anticipate she would have greater than partial response before we consider either adding Revlimid or switching her to maintenance treatment  Other constipation She is still battling with severe constipation I recommend aggressive laxative therapy  Pancytopenia, acquired ([place]) She is not symptomatic and does not need blood transfusion I would defer to nephrologist for anemia management  ESRD (end stage renal disease) on dialysis [facility]) She will continue hemodialysis on Tuesdays, Thursdays and Saturdays  Nausea in adult The oral dexamethasone has helped She will continue antiemetics as needed and aggressive management of severe constipation  Cancer associated pain Her bone pain has improved with daily dexamethasone Have refilled her prescription today We discussed narcotic refill policy   No orders of the defined types were placed in this encounter.   INTERVAL HISTORY: Please see below for problem oriented charting. She returns today for cycle 2 of treatment She felt better since last time I saw her Her nausea is better controlled Her chronic pain has improved with daily dexamethasone and her current prescription pain medicine She had some constipation and is taking aggressive laxative therapy She tolerated dialysis well The patient denies any recent signs or symptoms of bleeding such as spontaneous epistaxis, hematuria or hematochezia.   SUMMARY OF ONCOLOGIC HISTORY:   Multiple myeloma not  having achieved remission ([place])   03/03/2019 Initial Diagnosis    Multiple myeloma not having achieved remission ([place])    03/03/2019 Imaging    1. Round and oval lucent/lytic lesions in the skull, bilateral humeri, distal left femur, and possibly also in the pelvis are compatible with multiple myeloma. No right lower extremity lytic lesion identified. No pathologic fracture. 2. Advanced degenerative changes in the spine with no vertebral myeloma evident by plain radiography. 3. Advanced degenerative changes in the right upper extremity. Bilateral knee arthroplasty. 4.  Aortic Atherosclerosis (ICD10-I70.0).    03/04/2019 -  Chemotherapy    The patient had bortezomib for chemotherapy treatment.     03/04/2019 Bone Marrow Biopsy    Bone Marrow, Aspirate,Biopsy, and Clot - HYPERCELLULAR BONE MARROW FOR AGE WITH PLASMACYTOSIS. - SEE COMMENT. PERIPHERAL BLOOD: - NORMOCYTIC-NORMOCHROMIC ANEMIA. Diagnosis Note The bone marrow is hypercellular for age with increased number of atypical plasma cells averaging 30% of all cells in the aspirate associated with interstitial infiltrates and numerous variably sized aggregates in the clot and biopsy sections. 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 Confirmatory in situ hybridization for kappa and lambda as well as CD138 will be performed and the results reported in an addendum. The background shows trilineage hematopoiesis with mild non-specific changes primarily involving the erythroid series. Correlation with cytogenetic and FISH studies is recommended.      REVIEW OF SYSTEMS:   Constitutional: Denies fevers, chills or abnormal weight loss Eyes: Denies blurriness of vision Ears, nose, mouth, throat, and face: Denies mucositis or sore throat Respiratory: Denies cough, dyspnea or wheezes Cardiovascular: Denies palpitation, chest discomfort or lower extremity swelling Skin: Denies abnormal skin rashes Lymphatics: Denies new lymphadenopathy  or easy bruising  Behavioral/Psych: Mood is stable, no new changes  All other systems were reviewed with the patient and are negative.  I have reviewed the past medical history, past surgical history, social history and family history with the patient and they are unchanged from previous note.  ALLERGIES:  is allergic to aspirin; penicillins; brimonidine; diltiazem hcl; hydrocodone-acetaminophen; morphine; and neurontin [gabapentin].  MEDICATIONS:  Current Outpatient Medications  Medication Sig Dispense Refill  . allopurinol (ZYLOPRIM) 100 MG tablet Take 0.5 tablets (50 mg total) by mouth daily. 30 tablet 0  . Coenzyme Q10 (CO Q 10 PO) Take 1 tablet by mouth daily.     Marland Kitchen dexamethasone (DECADRON) 4 MG tablet Take 1 tablet (4 mg total) by mouth daily. 30 tablet 0  . dorzolamide (TRUSOPT) 2 % ophthalmic solution Place 1 drop into the left eye every evening.   1  . fluorometholone (FML) 0.1 % ophthalmic suspension Place 1 drop into the right eye 3 (three) times daily.     Marland Kitchen glimepiride (AMARYL) 1 MG tablet Take 0.5 mg by mouth 2 (two) times daily.     Marland Kitchen HYDROcodone-acetaminophen (NORCO/VICODIN) 5-325 MG tablet Take 1 tablet by mouth every 6 (six) hours as needed for moderate pain. 30 tablet 0  . isosorbide-hydrALAZINE (BIDIL) 20-37.5 MG tablet Take 1 tablet by mouth 2 (two) times a day. 60 tablet 0  . LUMIGAN 0.01 % SOLN Place 1 drop into the left eye every evening.     Marland Kitchen LYRICA 50 MG capsule Take 50 mg by mouth daily.     . Multiple Vitamin (MULTIVITAMIN WITH MINERALS) TABS tablet Take 1 tablet by mouth daily.    . ondansetron (ZOFRAN) 4 MG tablet Take 1 tablet (4 mg total) by mouth every 8 (eight) hours as needed for nausea. 30 tablet 1  . pantoprazole (PROTONIX) 40 MG tablet Take 40 mg by mouth daily.    . polyethylene glycol (MIRALAX / GLYCOLAX) 17 g packet Take 5.6 g by mouth at bedtime.    . prochlorperazine (COMPAZINE) 5 MG tablet Take 1 tablet (5 mg total) by mouth every 6 (six) hours as  needed for nausea or vomiting. 30 tablet 1  . senna-docusate (SENOKOT-S) 8.6-50 MG tablet Take 2 tablets by mouth 2 (two) times daily. 30 tablet 0  . sevelamer carbonate (RENVELA) 2.4 g PACK Take 2.4 g by mouth 3 (three) times daily with meals. 90 each 0  . sodium chloride (OCEAN) 0.65 % SOLN nasal spray Place 1 spray into both nostrils as needed for congestion.     No current facility-administered medications for this visit.     PHYSICAL EXAMINATION: ECOG PERFORMANCE STATUS: 2 - Symptomatic, <50% confined to bed  Vitals:   03/26/19 0926  BP: (!) 170/61  Pulse: 90  Resp: 18  Temp: 98.2 F (36.8 C)  SpO2: 99%   Filed Weights   03/26/19 0926  Weight: 169 lb 1.6 oz (76.7 kg)    GENERAL:alert, no distress and comfortable SKIN: skin color, texture, turgor are normal, no rashes or significant lesions EYES: normal, Conjunctiva are pink and non-injected, sclera clear OROPHARYNX:no exudate, no erythema and lips, buccal mucosa, and tongue normal  NECK: supple, thyroid normal size, non-tender, without nodularity LYMPH:  no palpable lymphadenopathy in the cervical, axillary or inguinal LUNGS: clear to auscultation and percussion with normal breathing effort HEART: regular rate & rhythm and no murmurs and no lower extremity edema ABDOMEN:abdomen soft, non-tender and normal bowel sounds Musculoskeletal:no cyanosis of digits and no clubbing  NEURO: alert &  oriented x 3 with fluent speech, no focal motor/sensory deficits  LABORATORY DATA:  I have reviewed the data as listed    Component Value Date/Time   NA 136 03/26/2019 0902   K 3.1 (L) 03/26/2019 0902   CL 94 (L) 03/26/2019 0902   CO2 28 03/26/2019 0902   GLUCOSE 84 03/26/2019 0902   BUN 7 (L) 03/26/2019 0902   CREATININE 3.95 (HH) 03/26/2019 0902   CREATININE 1.03 (H) 05/11/2016 0931   CALCIUM 7.5 (L) 03/26/2019 0902   PROT 10.5 (H) 03/26/2019 0902   ALBUMIN 2.3 (L) 03/26/2019 0902   AST 24 03/26/2019 0902   ALT 14 03/26/2019  0902   ALKPHOS 34 (L) 03/26/2019 0902   BILITOT 0.6 03/26/2019 0902   GFRNONAA 10 (L) 03/26/2019 0902   GFRAA 11 (L) 03/26/2019 0902    No results found for: SPEP, UPEP  Lab Results  Component Value Date   WBC 4.3 03/26/2019   NEUTROABS 2.2 03/26/2019   HGB 7.7 (L) 03/26/2019   HCT 25.0 (L) 03/26/2019   MCV 91.2 03/26/2019   PLT 234 03/26/2019      Chemistry      Component Value Date/Time   NA 136 03/26/2019 0902   K 3.1 (L) 03/26/2019 0902   CL 94 (L) 03/26/2019 0902   CO2 28 03/26/2019 0902   BUN 7 (L) 03/26/2019 0902   CREATININE 3.95 (HH) 03/26/2019 0902   CREATININE 1.03 (H) 05/11/2016 0931      Component Value Date/Time   CALCIUM 7.5 (L) 03/26/2019 0902   ALKPHOS 34 (L) 03/26/2019 0902   AST 24 03/26/2019 0902   ALT 14 03/26/2019 0902   BILITOT 0.6 03/26/2019 0902       RADIOGRAPHIC STUDIES: I have personally reviewed the radiological images as listed and agreed with the findings in the report. Dg Chest 1 View  Result Date: 02/25/2019 CLINICAL DATA:  Acute kidney injury and cough. EXAM: CHEST  1 VIEW COMPARISON:  None. FINDINGS: Left chest wall pacer device is noted with leads in the right atrial appendage and right ventricle. Mild cardiac enlargement. Aortic atherosclerosis. No pleural effusion or edema. No airspace opacities. IMPRESSION: 1. Cardiac enlargement.  No acute findings. 2.  Aortic Atherosclerosis (ICD10-I70.0). Electronically Signed   By: Kerby Moors M.D.   On: 02/25/2019 13:51   Dg Chest 2 View  Result Date: 03/01/2019 CLINICAL DATA:  Hypertension.  Diabetes. EXAM: CHEST - 2 VIEW COMPARISON:  Feb 28, 2019 FINDINGS: Stable cardiomegaly. The hila and mediastinum are unremarkable. Mild atelectasis in the left base. Mild patchy opacity in the right lung, unchanged. No other acute changes. IMPRESSION: Stable patchy opacities in the right lung. No other interval changes. Electronically Signed   By: Dorise Bullion III M.D   On: 03/01/2019 17:04   US  Renal  Result Date: 02/25/2019 CLINICAL DATA:  85 year old female with acute renal failure EXAM: RENAL / URINARY TRACT ULTRASOUND COMPLETE COMPARISON:  None. FINDINGS: Right Kidney: Renal measurements: 11.9 x 4.1 x 4.3 cm = volume: 111 mL . Echogenicity within normal limits. No mass or hydronephrosis visualized. Left Kidney: Renal measurements: 12.5 x 4.2 x 4.2 cm = volume: 115 mL. Two renal cysts are present measuring 1.2 cm. Echogenicity within normal limits. No mass or hydronephrosis visualized. Bladder: Appears normal for degree of bladder distention, but bladder not well distended. IMPRESSION: 1. Unremarkable kidneys except for 2 small LEFT renal cysts. No evidence of hydronephrosis. Electronically Signed   By: Cleatis Polka.D.  On: 02/25/2019 16:00   Ir Fluoro Guide Cv Line Right  Result Date: 03/07/2019 INDICATION: 25 year old with acute kidney injury.  Need for hemodialysis. EXAM: FLUOROSCOPIC AND ULTRASOUND GUIDED PLACEMENT OF A TUNNELED DIALYSIS CATHETER Physician: Stephan Minister. Anselm Pancoast, MD MEDICATIONS: Vancomycin 1 gm IV; The antibiotic was administered within an appropriate time interval prior to skin puncture. ANESTHESIA/SEDATION: Versed 1.0 mg IV; Fentanyl 50 mcg IV; Moderate Sedation Time:  22 minutes The patient was continuously monitored during the procedure by the interventional radiology nurse under my direct supervision. FLUOROSCOPY TIME:  Fluoroscopy Time: 30 seconds, 2 mGy COMPLICATIONS: None immediate. PROCEDURE: Informed consent was obtained for placement of a tunneled dialysis catheter. The patient was placed supine on the interventional table. Ultrasound confirmed a patent right internal jugular vein. Ultrasound images were obtained for documentation. The right side of the neck and chest was prepped and draped in a sterile fashion. The right neck was anesthetized with 1% lidocaine. Maximal barrier sterile technique was utilized including caps, mask, sterile gowns, sterile gloves, sterile  drape, hand hygiene and skin antiseptic. A small incision was made with #11 blade scalpel. A 21 gauge needle directed into the right internal jugular vein with ultrasound guidance. A micropuncture dilator set was placed. A 19 cm tip to cuff Palindrome catheter was selected. The skin below the right clavicle was anesthetized and a small incision was made with an #11 blade scalpel. A subcutaneous tunnel was formed to the vein dermatotomy site. The catheter was brought through the tunnel. The vein dermatotomy site was dilated to accommodate a peel-away sheath. The catheter was placed through the peel-away sheath and directed into the central venous structures. The tip of the catheter was placed at the superior cavoatrial junction with fluoroscopy. Fluoroscopic images were obtained for documentation. Both lumens were found to aspirate and flush well. The proper amount of heparin was flushed in both lumens. The vein dermatotomy site was closed using a single layer of absorbable suture and Dermabond. Gel-Foam was placed in the subcutaneous tract for hemostasis. The catheter was secured to the skin using Prolene suture. IMPRESSION: Successful placement of a right jugular tunneled dialysis catheter using ultrasound and fluoroscopic guidance. Electronically Signed   By: Markus Daft M.D.   On: 03/07/2019 10:45   Ir US Guide Vasc Access Right  Result Date: 03/07/2019 INDICATION: 57 year old with acute kidney injury.  Need for hemodialysis. EXAM: FLUOROSCOPIC AND ULTRASOUND GUIDED PLACEMENT OF A TUNNELED DIALYSIS CATHETER Physician: Stephan Minister. Anselm Pancoast, MD MEDICATIONS: Vancomycin 1 gm IV; The antibiotic was administered within an appropriate time interval prior to skin puncture. ANESTHESIA/SEDATION: Versed 1.0 mg IV; Fentanyl 50 mcg IV; Moderate Sedation Time:  22 minutes The patient was continuously monitored during the procedure by the interventional radiology nurse under my direct supervision. FLUOROSCOPY TIME:  Fluoroscopy  Time: 30 seconds, 2 mGy COMPLICATIONS: None immediate. PROCEDURE: Informed consent was obtained for placement of a tunneled dialysis catheter. The patient was placed supine on the interventional table. Ultrasound confirmed a patent right internal jugular vein. Ultrasound images were obtained for documentation. The right side of the neck and chest was prepped and draped in a sterile fashion. The right neck was anesthetized with 1% lidocaine. Maximal barrier sterile technique was utilized including caps, mask, sterile gowns, sterile gloves, sterile drape, hand hygiene and skin antiseptic. A small incision was made with #11 blade scalpel. A 21 gauge needle directed into the right internal jugular vein with ultrasound guidance. A micropuncture dilator set was placed. A 19 cm tip to  cuff Palindrome catheter was selected. The skin below the right clavicle was anesthetized and a small incision was made with an #11 blade scalpel. A subcutaneous tunnel was formed to the vein dermatotomy site. The catheter was brought through the tunnel. The vein dermatotomy site was dilated to accommodate a peel-away sheath. The catheter was placed through the peel-away sheath and directed into the central venous structures. The tip of the catheter was placed at the superior cavoatrial junction with fluoroscopy. Fluoroscopic images were obtained for documentation. Both lumens were found to aspirate and flush well. The proper amount of heparin was flushed in both lumens. The vein dermatotomy site was closed using a single layer of absorbable suture and Dermabond. Gel-Foam was placed in the subcutaneous tract for hemostasis. The catheter was secured to the skin using Prolene suture. IMPRESSION: Successful placement of a right jugular tunneled dialysis catheter using ultrasound and fluoroscopic guidance. Electronically Signed   By: Markus Daft M.D.   On: 03/07/2019 10:45   Dg Chest Port 1 View  Result Date: 02/28/2019 CLINICAL DATA:   Dyspnea. EXAM: PORTABLE CHEST 1 VIEW COMPARISON:  Radiograph of Feb 25, 2019. FINDINGS: Stable cardiomegaly. Atherosclerosis of thoracic aorta is noted. Left-sided pacemaker is unchanged in position. No pneumothorax or pleural effusion is noted. Left lung is clear. Mildly increased right lung opacities are noted concerning for either asymmetric edema or possibly pneumonia. Severe degenerative changes seen involving the right glenohumeral joint. IMPRESSION: Mildly increased right lung opacities are noted concerning for possible asymmetric edema or possibly pneumonia. Aortic Atherosclerosis (ICD10-I70.0). Electronically Signed   By: Marijo Conception M.D.   On: 02/28/2019 12:26   Dg Bone Survey Met  Result Date: 03/03/2019 CLINICAL DATA:  85 year old female with multiple myeloma, pain radiating through the right leg. EXAM: METASTATIC BONE SURVEY COMPARISON:  CT lumbar myelogram 06/19/2018 FINDINGS: Scattered round lucent lesions in the skull. Degenerative changes in the cervical spine where bone mineralization appears within normal limits. Degenerative changes in the thoracic spine and lumbar spine. No spinal compression fracture identified. Spine bone mineralization appears within normal limits. Previous lower lumbar interbody fusion. Calcified aortic atherosclerosis. Questionable heterogeneous lucency in the iliac wings. Femoral heads are normally located and hip joint spaces are normal for age. Pubic rami appear intact. No definite lytic lesion in the right lower extremity. Total right knee arthroplasty. There is an oval lytic lesion in the distal left femur measuring 11 millimeters (arrow). Prior left knee arthroplasty also. Elsewhere in the left lower extremity bone mineralization appears normal for age. Advanced degenerative changes at the right shoulder and wrist. There are lucent lesions in the distal right humerus. Cardiomegaly with left chest cardiac pacemaker. No discrete rib lesion identified. Small  lucent lesions in the mid and distal humerus. Mild to moderate degenerative changes in the left upper extremity. No pathologic fracture identified. IMPRESSION: 1. Round and oval lucent/lytic lesions in the skull, bilateral humeri, distal left femur, and possibly also in the pelvis are compatible with multiple myeloma. No right lower extremity lytic lesion identified. No pathologic fracture. 2. Advanced degenerative changes in the spine with no vertebral myeloma evident by plain radiography. 3. Advanced degenerative changes in the right upper extremity. Bilateral knee arthroplasty. 4.  Aortic Atherosclerosis (ICD10-I70.0). Electronically Signed   By: Genevie Ann M.D.   On: 03/03/2019 19:59   Vas [place] Upper Extremity Arterial Duplex  Result Date: 03/08/2019 UPPER EXTREMITY DUPLEX STUDY Indications: Pre operative for dialysis access.  Comparison Study: No prior study Performing Technologist: Candace  Mauro Kaufmann RVS  Examination Guidelines: A complete evaluation includes B-mode imaging, spectral Doppler, color Doppler, and power Doppler as needed of all accessible portions of each vessel. Bilateral testing is considered an integral part of a complete examination. Limited examinations for reoccurring indications may be performed as noted.  Right Doppler Findings: +--------+----------+---------+-----------+--------+ Site    PSV (cm/s)Waveform Plaque     Comments +--------+----------+---------+-----------+--------+ [id number]       triphasicno stenosis         +--------+----------+---------+-----------+--------+ Radial  83        triphasicno stenosis         +--------+----------+---------+-----------+--------+ Ulnar   68        triphasicno stenosistortuous +--------+----------+---------+-----------+--------+ Right Pre-Dialysis Findings: +-----------------------+----------+--------------------+---------+--------+ Location               PSV (cm/s)Intralum. Diam. (cm)Waveform Comments  +-----------------------+----------+--------------------+---------+--------+ Brachial Antecub. fossa          0.60                triphasic         +-----------------------+----------+--------------------+---------+--------+ Radial Art at Wrist              0.33                triphasic         +-----------------------+----------+--------------------+---------+--------+ Ulnar Art at Wrist               0.32                triphasic         +-----------------------+----------+--------------------+---------+--------+ Left Doppler Findings: +--------+----------+---------+-----------+--------+ Site    PSV (cm/s)Waveform Plaque     Comments +--------+----------+---------+-----------+--------+ [id number]        triphasicno stenosis         +--------+----------+---------+-----------+--------+ Radial  182       triphasicno stenosis         +--------+----------+---------+-----------+--------+ Ulnar   133       triphasicno stenosis         +--------+----------+---------+-----------+--------+ Left Pre-Dialysis Findings: +-----------------------+----------+--------------------+---------+--------+ Location               PSV (cm/s)Intralum. Diam. (cm)Waveform Comments +-----------------------+----------+--------------------+---------+--------+ Brachial Antecub. fossa          0.65                triphasic         +-----------------------+----------+--------------------+---------+--------+ Radial Art at Wrist              0.33                triphasic         +-----------------------+----------+--------------------+---------+--------+ Ulnar Art at Wrist               0.22                triphasic         +-----------------------+----------+--------------------+---------+--------+  Summary:  Right: No obstruction visualized in the right upper extremity. Left: No obstruction visualized in the left upper extremity. *See table(s) above for measurements and observations.  Electronically signed by [name] MD on 03/08/2019 at 5:49:17 PM.    Final    Vas [place] Upper Ext Vein Mapping (pre-op Avf)  Result Date: 03/08/2019 UPPER EXTREMITY VEIN MAPPING  Indications: Pre-dialysis access. Limitations: Bandages, IV lines Comparison Study: No prior study on file for comparison Performing Technologist: Sharion Dove RVS  Examination Guidelines: A complete evaluation includes B-mode imaging, spectral Doppler, color Doppler, and power Doppler as needed  of all accessible portions of each vessel. Bilateral testing is considered an integral part of a complete examination. Limited examinations for reoccurring indications may be performed as noted. +-----------------+-------------+----------+---------+ Right Cephalic   Diameter (cm)Depth (cm)Findings  +-----------------+-------------+----------+---------+ Prox upper arm       0.50        0.44             +-----------------+-------------+----------+---------+ Mid upper arm        0.58        0.48             +-----------------+-------------+----------+---------+ Dist upper arm       0.60        0.46             +-----------------+-------------+----------+---------+ Antecubital fossa    0.70        0.42             +-----------------+-------------+----------+---------+ Prox forearm         0.42        0.36   branching +-----------------+-------------+----------+---------+ Mid forearm          0.36        0.55   branching +-----------------+-------------+----------+---------+ Wrist                0.29        0.81             +-----------------+-------------+----------+---------+ +--------------+-------------+----------+---------+ Right Basilic Diameter (cm)Depth (cm)Findings  +--------------+-------------+----------+---------+ Dist upper arm                       branching +--------------+-------------+----------+---------+ +-----------------+-------------+----------+--------------+ Left  Cephalic    Diameter (cm)Depth (cm)   Findings    +-----------------+-------------+----------+--------------+ Prox upper arm                          not visualized +-----------------+-------------+----------+--------------+ Mid upper arm        0.41        0.66                  +-----------------+-------------+----------+--------------+ Dist upper arm       0.37        0.59     branching    +-----------------+-------------+----------+--------------+ Antecubital fossa    0.53        0.67                  +-----------------+-------------+----------+--------------+ Prox forearm         0.21        0.24                  +-----------------+-------------+----------+--------------+ Mid forearm          0.12        0.29                  +-----------------+-------------+----------+--------------+ Wrist                0.19        0.33                  +-----------------+-------------+----------+--------------+ +-----------------+-------------+----------+----------------------------+ Left Basilic     Diameter (cm)Depth (cm)          Findings           +-----------------+-------------+----------+----------------------------+ Prox upper arm  not visualized        +-----------------+-------------+----------+----------------------------+ Mid upper arm        0.49        2.24              origin            +-----------------+-------------+----------+----------------------------+ Dist upper arm       0.52        2.34            branching           +-----------------+-------------+----------+----------------------------+ Antecubital fossa    0.30        0.96                                +-----------------+-------------+----------+----------------------------+ Prox forearm                                   not visualized        +-----------------+-------------+----------+----------------------------+ Mid forearm                              branching and not visualized +-----------------+-------------+----------+----------------------------+ Wrist                                          not visualized        +-----------------+-------------+----------+----------------------------+ *See table(s) above for measurements and observations.  Diagnosing physician: [name] MD Electronically signed by [name] MD on 03/08/2019 at 5:50:00 PM.    Final     All questions were answered. The patient knows to call the clinic with any problems, questions or concerns. No barriers to learning was detected.  I spent 25 minutes counseling the patient face to face. The total time spent in the appointment was 30 minutes and more than 50% was on counseling and review of test results  Heath Lark, MD 03/26/2019 11:19 AM

## 2019-03-26 NOTE — Assessment & Plan Note (Signed)
The oral dexamethasone has helped She will continue antiemetics as needed and aggressive management of severe constipation

## 2019-03-26 NOTE — Progress Notes (Signed)
Ok to treat with hemoglobin 7.7 today, per Dr. Alvy Bimler.

## 2019-03-26 NOTE — Patient Instructions (Signed)
[facility] Discharge Instructions for Patients Receiving Chemotherapy  Today you received the following chemotherapy agents: bortezomib (Velcade).  To help prevent nausea and vomiting after your treatment, we encourage you to take your nausea medication as prescribed by your physician.    If you develop nausea and vomiting that is not controlled by your nausea medication, call the clinic.   BELOW ARE SYMPTOMS THAT SHOULD BE REPORTED IMMEDIATELY:  *FEVER GREATER THAN 100.5 F  *CHILLS WITH OR WITHOUT FEVER  NAUSEA AND VOMITING THAT IS NOT CONTROLLED WITH YOUR NAUSEA MEDICATION  *UNUSUAL SHORTNESS OF BREATH  *UNUSUAL BRUISING OR BLEEDING  TENDERNESS IN MOUTH AND THROAT WITH OR WITHOUT PRESENCE OF ULCERS  *URINARY PROBLEMS  *BOWEL PROBLEMS  UNUSUAL RASH Items with * indicate a potential emergency and should be followed up as soon as possible.  Feel free to call the clinic should you have any questions or concerns. The clinic phone number is [phone].  Please show the [place] at check-in to the Emergency Department and triage nurse.  Bortezomib injection What is this medicine? BORTEZOMIB (bor TEZ oh mib) is a medicine that targets proteins in cancer cells and stops the cancer cells from growing. It is used to treat multiple myeloma and mantle-cell lymphoma. This medicine may be used for other purposes; ask your health care provider or pharmacist if you have questions. COMMON BRAND NAME(S): Velcade What should I tell my health care provider before I take this medicine? They need to know if you have any of these conditions: -diabetes -heart disease -irregular heartbeat -liver disease -on hemodialysis -low blood counts, like low white blood cells, platelets, or hemoglobin -peripheral neuropathy -taking medicine for blood pressure -an unusual or allergic reaction to bortezomib, mannitol, boron, other medicines, foods, dyes, or  preservatives -pregnant or trying to get pregnant -breast-feeding How should I use this medicine? This medicine is for injection into a vein or for injection under the skin. It is given by a health care professional in a hospital or clinic setting. Talk to your pediatrician regarding the use of this medicine in children. Special care may be needed. Overdosage: If you think you have taken too much of this medicine contact a poison control center or emergency room at once. NOTE: This medicine is only for you. Do not share this medicine with others. What if I miss a dose? It is important not to miss your dose. Call your doctor or health care professional if you are unable to keep an appointment. What may interact with this medicine? This medicine may interact with the following medications: -ketoconazole -rifampin -ritonavir -St. John's Wort This list may not describe all possible interactions. Give your health care provider a list of all the medicines, herbs, non-prescription drugs, or dietary supplements you use. Also tell them if you smoke, drink alcohol, or use illegal drugs. Some items may interact with your medicine. What should I watch for while using this medicine? You may get drowsy or dizzy. Do not drive, use machinery, or do anything that needs mental alertness until you know how this medicine affects you. Do not stand or sit up quickly, especially if you are an older patient. This reduces the risk of dizzy or fainting spells. In some cases, you may be given additional medicines to help with side effects. Follow all directions for their use. Call your doctor or health care professional for advice if you get a fever, chills or sore throat, or other symptoms of a cold  or flu. Do not treat yourself. This drug decreases your body's ability to fight infections. Try to avoid being around people who are sick. This medicine may increase your risk to bruise or bleed. Call your doctor or health  care professional if you notice any unusual bleeding. You may need blood work done while you are taking this medicine. In some patients, this medicine may cause a serious brain infection that may cause death. If you have any problems seeing, thinking, speaking, walking, or standing, tell your doctor right away. If you cannot reach your doctor, urgently seek other source of medical care. Check with your doctor or health care professional if you get an attack of severe diarrhea, nausea and vomiting, or if you sweat a lot. The loss of too much body fluid can make it dangerous for you to take this medicine. Do not become pregnant while taking this medicine or for at least 7 months after stopping it. Women should inform their doctor if they wish to become pregnant or think they might be pregnant. Men should not father a child while taking this medicine and for at least 4 months after stopping it. There is a potential for serious side effects to an unborn child. Talk to your health care professional or pharmacist for more information. Do not breast-feed an infant while taking this medicine or for 2 months after stopping it. This medicine may interfere with the ability to have a child. You should talk with your doctor or health care professional if you are concerned about your fertility. What side effects may I notice from receiving this medicine? Side effects that you should report to your doctor or health care professional as soon as possible: -allergic reactions like skin rash, itching or hives, swelling of the face, lips, or tongue -breathing problems -changes in hearing -changes in vision -fast, irregular heartbeat -feeling faint or lightheaded, falls -pain, tingling, numbness in the hands or feet -right upper belly pain -seizures -swelling of the ankles, feet, hands -unusual bleeding or bruising -unusually weak or tired -vomiting -yellowing of the eyes or skin Side effects that usually do not  require medical attention (report to your doctor or health care professional if they continue or are bothersome): -changes in emotions or moods -constipation -diarrhea -loss of appetite -headache -irritation at site where injected -nausea This list may not describe all possible side effects. Call your doctor for medical advice about side effects. You may report side effects to FDA at 1-800-FDA-1088. Where should I keep my medicine? This drug is given in a hospital or clinic and will not be stored at home. NOTE: This sheet is a summary. It may not cover all possible information. If you have questions about this medicine, talk to your doctor, pharmacist, or health care provider.  2019 [employer] Standard (2018-02-11 16:29:31)

## 2019-03-28 ENCOUNTER — Inpatient Hospital Stay: Payer: Medicare HMO | Admitting: Hematology and Oncology

## 2019-03-28 ENCOUNTER — Inpatient Hospital Stay: Payer: Medicare HMO

## 2019-03-31 ENCOUNTER — Telehealth: Payer: Self-pay

## 2019-03-31 DIAGNOSIS — E1122 Type 2 diabetes mellitus with diabetic chronic kidney disease: Secondary | ICD-10-CM | POA: Diagnosis not present

## 2019-03-31 DIAGNOSIS — C9000 Multiple myeloma not having achieved remission: Secondary | ICD-10-CM | POA: Diagnosis not present

## 2019-03-31 DIAGNOSIS — D631 Anemia in chronic kidney disease: Secondary | ICD-10-CM | POA: Diagnosis not present

## 2019-03-31 DIAGNOSIS — M170 Bilateral primary osteoarthritis of knee: Secondary | ICD-10-CM | POA: Diagnosis not present

## 2019-03-31 DIAGNOSIS — D630 Anemia in neoplastic disease: Secondary | ICD-10-CM | POA: Diagnosis not present

## 2019-03-31 DIAGNOSIS — E1142 Type 2 diabetes mellitus with diabetic polyneuropathy: Secondary | ICD-10-CM | POA: Diagnosis not present

## 2019-03-31 DIAGNOSIS — N186 End stage renal disease: Secondary | ICD-10-CM | POA: Diagnosis not present

## 2019-03-31 DIAGNOSIS — M19071 Primary osteoarthritis, right ankle and foot: Secondary | ICD-10-CM | POA: Diagnosis not present

## 2019-03-31 DIAGNOSIS — I120 Hypertensive chronic kidney disease with stage 5 chronic kidney disease or end stage renal disease: Secondary | ICD-10-CM | POA: Diagnosis not present

## 2019-03-31 DIAGNOSIS — M1030 Gout due to renal impairment, unspecified site: Secondary | ICD-10-CM | POA: Diagnosis not present

## 2019-03-31 NOTE — Telephone Encounter (Signed)
Pt's spouse called to report pt has had no BM since last Wed 6/10 but is passing gas. He reports she is eating very little and has only been taking "about half a dose" of miralax each day".  And taking sennokot once a day.   She does not like taking Sennakot 2 times a day because it makes her stomach cramp. Pt is also on fluid restriction d/t ESRD. Please advise.

## 2019-04-01 ENCOUNTER — Encounter: Payer: Self-pay | Admitting: Hematology and Oncology

## 2019-04-01 NOTE — Telephone Encounter (Signed)
Miralax BID and sennokot as tolerated Other options include lactulose (will have to call in) 10 ml BID If she has no nausea, make sure she stops zofran

## 2019-04-01 NOTE — Progress Notes (Signed)
Called pt to introduce myself as her Arboriculturist and to discuss copay assistance.  Pt requested I talk to her daughter, Ms. Benjamine Mola because she handles all of her affairs.  I spoke to Ms. Rice and she informed me that pt's ins will be paying for her treatment at 100% because she's met her deductible for the year.  I informed her of the [place], went over what it covers and gave her the income requirement.  She thinks her mother exceeds the requirement but she will double check and if she's doesn't she will call me back to apply.  I will give the pt my card on 04/04/19 for any questions or concerns she may have in the future.

## 2019-04-02 DIAGNOSIS — D631 Anemia in chronic kidney disease: Secondary | ICD-10-CM | POA: Diagnosis not present

## 2019-04-02 DIAGNOSIS — I120 Hypertensive chronic kidney disease with stage 5 chronic kidney disease or end stage renal disease: Secondary | ICD-10-CM | POA: Diagnosis not present

## 2019-04-02 DIAGNOSIS — M19071 Primary osteoarthritis, right ankle and foot: Secondary | ICD-10-CM | POA: Diagnosis not present

## 2019-04-02 DIAGNOSIS — E1142 Type 2 diabetes mellitus with diabetic polyneuropathy: Secondary | ICD-10-CM | POA: Diagnosis not present

## 2019-04-02 DIAGNOSIS — E1122 Type 2 diabetes mellitus with diabetic chronic kidney disease: Secondary | ICD-10-CM | POA: Diagnosis not present

## 2019-04-02 DIAGNOSIS — M170 Bilateral primary osteoarthritis of knee: Secondary | ICD-10-CM | POA: Diagnosis not present

## 2019-04-02 DIAGNOSIS — M1030 Gout due to renal impairment, unspecified site: Secondary | ICD-10-CM | POA: Diagnosis not present

## 2019-04-02 DIAGNOSIS — D630 Anemia in neoplastic disease: Secondary | ICD-10-CM | POA: Diagnosis not present

## 2019-04-02 DIAGNOSIS — N186 End stage renal disease: Secondary | ICD-10-CM | POA: Diagnosis not present

## 2019-04-02 DIAGNOSIS — C9000 Multiple myeloma not having achieved remission: Secondary | ICD-10-CM | POA: Diagnosis not present

## 2019-04-04 ENCOUNTER — Other Ambulatory Visit: Payer: Self-pay

## 2019-04-04 ENCOUNTER — Inpatient Hospital Stay (HOSPITAL_BASED_OUTPATIENT_CLINIC_OR_DEPARTMENT_OTHER): Payer: Medicare HMO | Admitting: Hematology and Oncology

## 2019-04-04 ENCOUNTER — Inpatient Hospital Stay: Payer: Medicare HMO

## 2019-04-04 ENCOUNTER — Other Ambulatory Visit: Payer: Self-pay | Admitting: Hematology and Oncology

## 2019-04-04 ENCOUNTER — Encounter: Payer: Self-pay | Admitting: Hematology and Oncology

## 2019-04-04 VITALS — BP 176/51 | HR 77 | Temp 98.7°F | Resp 18 | Ht 65.0 in | Wt 164.8 lb

## 2019-04-04 DIAGNOSIS — D61818 Other pancytopenia: Secondary | ICD-10-CM | POA: Diagnosis not present

## 2019-04-04 DIAGNOSIS — I120 Hypertensive chronic kidney disease with stage 5 chronic kidney disease or end stage renal disease: Secondary | ICD-10-CM | POA: Diagnosis not present

## 2019-04-04 DIAGNOSIS — C9000 Multiple myeloma not having achieved remission: Secondary | ICD-10-CM

## 2019-04-04 DIAGNOSIS — R110 Nausea: Secondary | ICD-10-CM

## 2019-04-04 DIAGNOSIS — R918 Other nonspecific abnormal finding of lung field: Secondary | ICD-10-CM | POA: Diagnosis not present

## 2019-04-04 DIAGNOSIS — Z992 Dependence on renal dialysis: Secondary | ICD-10-CM

## 2019-04-04 DIAGNOSIS — Z5111 Encounter for antineoplastic chemotherapy: Secondary | ICD-10-CM | POA: Diagnosis not present

## 2019-04-04 DIAGNOSIS — Z7984 Long term (current) use of oral hypoglycemic drugs: Secondary | ICD-10-CM

## 2019-04-04 DIAGNOSIS — E1122 Type 2 diabetes mellitus with diabetic chronic kidney disease: Secondary | ICD-10-CM

## 2019-04-04 DIAGNOSIS — I700 Atherosclerosis of aorta: Secondary | ICD-10-CM

## 2019-04-04 DIAGNOSIS — B372 Candidiasis of skin and nail: Secondary | ICD-10-CM

## 2019-04-04 DIAGNOSIS — K5900 Constipation, unspecified: Secondary | ICD-10-CM | POA: Diagnosis not present

## 2019-04-04 DIAGNOSIS — N186 End stage renal disease: Secondary | ICD-10-CM | POA: Diagnosis not present

## 2019-04-04 DIAGNOSIS — G893 Neoplasm related pain (acute) (chronic): Secondary | ICD-10-CM

## 2019-04-04 DIAGNOSIS — Z79899 Other long term (current) drug therapy: Secondary | ICD-10-CM

## 2019-04-04 DIAGNOSIS — K5909 Other constipation: Secondary | ICD-10-CM

## 2019-04-04 LAB — CBC WITH DIFFERENTIAL/PLATELET
Abs Immature Granulocytes: 0.03 10*3/uL (ref 0.00–0.07)
Basophils Absolute: 0 10*3/uL (ref 0.0–0.1)
Basophils Relative: 0 %
Eosinophils Absolute: 0.1 10*3/uL (ref 0.0–0.5)
Eosinophils Relative: 1 %
HCT: 25.7 % — ABNORMAL LOW (ref 36.0–46.0)
Hemoglobin: 7.9 g/dL — ABNORMAL LOW (ref 12.0–15.0)
Immature Granulocytes: 1 %
Lymphocytes Relative: 27 %
Lymphs Abs: 1.6 10*3/uL (ref 0.7–4.0)
MCH: 28.7 pg (ref 26.0–34.0)
MCHC: 30.7 g/dL (ref 30.0–36.0)
MCV: 93.5 fL (ref 80.0–100.0)
Monocytes Absolute: 0.6 10*3/uL (ref 0.1–1.0)
Monocytes Relative: 10 %
Neutro Abs: 3.6 10*3/uL (ref 1.7–7.7)
Neutrophils Relative %: 61 %
Platelets: 197 10*3/uL (ref 150–400)
RBC: 2.75 MIL/uL — ABNORMAL LOW (ref 3.87–5.11)
RDW: 18.8 % — ABNORMAL HIGH (ref 11.5–15.5)
WBC: 5.9 10*3/uL (ref 4.0–10.5)
nRBC: 0 % (ref 0.0–0.2)

## 2019-04-04 LAB — COMPREHENSIVE METABOLIC PANEL
ALT: 38 U/L (ref 0–44)
AST: 34 U/L (ref 15–41)
Albumin: 2.3 g/dL — ABNORMAL LOW (ref 3.5–5.0)
Alkaline Phosphatase: 39 U/L (ref 38–126)
Anion gap: 13 (ref 5–15)
BUN: 8 mg/dL (ref 8–23)
CO2: 26 mmol/L (ref 22–32)
Calcium: 8 mg/dL — ABNORMAL LOW (ref 8.9–10.3)
Chloride: 95 mmol/L — ABNORMAL LOW (ref 98–111)
Creatinine, Ser: 3.49 mg/dL (ref 0.44–1.00)
GFR calc Af Amer: 13 mL/min — ABNORMAL LOW (ref >60)
GFR calc non Af Amer: 11 mL/min — ABNORMAL LOW (ref >60)
Glucose, Bld: 87 mg/dL (ref 70–99)
Potassium: 3.3 mmol/L — ABNORMAL LOW (ref 3.5–5.1)
Sodium: 134 mmol/L — ABNORMAL LOW (ref 135–145)
Total Bilirubin: 0.6 mg/dL (ref 0.3–1.2)
Total Protein: 10.8 g/dL — ABNORMAL HIGH (ref 6.5–8.1)

## 2019-04-04 LAB — SAMPLE TO BLOOD BANK

## 2019-04-04 MED ORDER — DEXAMETHASONE 4 MG PO TABS: ORAL_TABLET | ORAL | 0 refills | Status: DC

## 2019-04-04 MED ORDER — NYSTATIN 100000 UNIT/GM EX POWD: Freq: Four times a day (QID) | CUTANEOUS | 0 refills | Status: DC

## 2019-04-04 MED ORDER — BORTEZOMIB CHEMO SQ INJECTION 3.5 MG (2.5MG/ML)
1.0400 mg/m2 | Freq: Once | INTRAMUSCULAR | Status: AC
  Administered 2019-04-04: 2 mg via SUBCUTANEOUS
  Filled 2019-04-04: qty 0.8

## 2019-04-04 MED ORDER — LACTULOSE 10 G PO PACK: 10.0000 g | PACK | Freq: Three times a day (TID) | ORAL | 9 refills | Status: DC

## 2019-04-04 MED ORDER — PROCHLORPERAZINE MALEATE 10 MG PO TABS: 10.0000 mg | ORAL_TABLET | Freq: Once | ORAL | Status: DC

## 2019-04-04 NOTE — Assessment & Plan Note (Signed)
She will continue hemodialysis on Tuesdays, Thursdays and Saturdays

## 2019-04-04 NOTE — Assessment & Plan Note (Signed)
She is anemic but not symptomatic We will proceed with treatment without dose adjustment Her nephrologist is managing her anemia

## 2019-04-04 NOTE — Progress Notes (Signed)
[place] OFFICE PROGRESS NOTE  Patient Care Team: Lucianne Lei, MD as PCP - General (Family Medicine)  ASSESSMENT & PLAN:  Multiple myeloma not having achieved remission ([place]) Overall, her performance status has improved somewhat since last time I saw her We will proceed with reduced dose weekly Velcade  Since she is doing better, I plan to reduce dexamethasone to twice a week Written instruction is given The plan would be to continue minimum 3 months of treatment I anticipate she would have greater than partial response before we consider either adding Revlimid or switching her to maintenance treatment  Pancytopenia, acquired ([place]) She is anemic but not symptomatic We will proceed with treatment without dose adjustment Her nephrologist is managing her anemia  ESRD (end stage renal disease) on dialysis [facility]) She will continue hemodialysis on Tuesdays, Thursdays and Saturdays  Cancer associated pain She has no pain and has not taken any prescription pain medicine recently I recommend dexamethasone taper as well  Other constipation She continues to have profound constipation I recommend her to continue taking MiraLAX and Senokot In addition, I will also add lactulose  Yeast infection of the skin She has yeast infection under her breast I will prescribe topical nystatin In addition, I will also reduce her dose of dexamethasone   No orders of the defined types were placed in this encounter.   INTERVAL HISTORY: Please see below for problem oriented charting. She returns today for further follow-up She continues to be constipated She is taking daily MiraLAX and Senokot She denies nausea Her pain has resolved She complained of skin rash under her right breast She tolerated hemodialysis well She denies dizziness, headache or chest pain Denies peripheral neuropathy  SUMMARY OF ONCOLOGIC HISTORY: Oncology History  Multiple myeloma not having achieved  remission ([place])  03/03/2019 Initial Diagnosis   Multiple myeloma not having achieved remission ([place])   03/03/2019 Imaging   1. Round and oval lucent/lytic lesions in the skull, bilateral humeri, distal left femur, and possibly also in the pelvis are compatible with multiple myeloma. No right lower extremity lytic lesion identified. No pathologic fracture. 2. Advanced degenerative changes in the spine with no vertebral myeloma evident by plain radiography. 3. Advanced degenerative changes in the right upper extremity. Bilateral knee arthroplasty. 4.  Aortic Atherosclerosis (ICD10-I70.0).   03/04/2019 -  Chemotherapy   The patient had bortezomib for chemotherapy treatment.    03/04/2019 Bone Marrow Biopsy   Bone Marrow, Aspirate,Biopsy, and Clot - HYPERCELLULAR BONE MARROW FOR AGE WITH PLASMACYTOSIS. - SEE COMMENT. PERIPHERAL BLOOD: - NORMOCYTIC-NORMOCHROMIC ANEMIA. Diagnosis Note The bone marrow is hypercellular for age with increased number of atypical plasma cells averaging 30% of all cells in the aspirate associated with interstitial infiltrates and numerous variably sized aggregates in the clot and biopsy sections. 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 Confirmatory in situ hybridization for kappa and lambda as well as CD138 will be performed and the results reported in an addendum. The background shows trilineage hematopoiesis with mild non-specific changes primarily involving the erythroid series. Correlation with cytogenetic and FISH studies is recommended.      REVIEW OF SYSTEMS:   Constitutional: Denies fevers, chills or abnormal weight loss Eyes: Denies blurriness of vision Ears, nose, mouth, throat, and face: Denies mucositis or sore throat Respiratory: Denies cough, dyspnea or wheezes Cardiovascular: Denies palpitation, chest discomfort or lower extremity swelling Gastrointestinal:  Denies nausea, heartburn or change in bowel habits Lymphatics: Denies new  lymphadenopathy or easy bruising  Neurological:Denies numbness, tingling or new weaknesses Behavioral/Psych: Mood is stable, no new changes  All other systems were reviewed with the patient and are negative.  I have reviewed the past medical history, past surgical history, social history and family history with the patient and they are unchanged from previous note.  ALLERGIES:  is allergic to aspirin; penicillins; brimonidine; diltiazem hcl; hydrocodone-acetaminophen; morphine; and neurontin [gabapentin].  MEDICATIONS:  Current Outpatient Medications  Medication Sig Dispense Refill  . allopurinol (ZYLOPRIM) 100 MG tablet Take 0.5 tablets (50 mg total) by mouth daily. 30 tablet 0  . Coenzyme Q10 (CO Q 10 PO) Take 1 tablet by mouth daily.     Marland Kitchen dexamethasone (DECADRON) 4 MG tablet Take 1 tab on Mondays and Thursdays only 30 tablet 0  . dorzolamide (TRUSOPT) 2 % ophthalmic solution Place 1 drop into the left eye every evening.   1  . fluorometholone (FML) 0.1 % ophthalmic suspension Place 1 drop into the right eye 3 (three) times daily.     Marland Kitchen glimepiride (AMARYL) 1 MG tablet Take 0.5 mg by mouth 2 (two) times daily.     Marland Kitchen HYDROcodone-acetaminophen (NORCO/VICODIN) 5-325 MG tablet Take 1 tablet by mouth every 6 (six) hours as needed for moderate pain. 30 tablet 0  . isosorbide-hydrALAZINE (BIDIL) 20-37.5 MG tablet Take 1 tablet by mouth 2 (two) times a day. 60 tablet 0  . lactulose (CHRONULAC) 10 GM/15ML solution Take 15 mLs (10 g total) by mouth 2 (two) times daily. 236 mL 1  . LUMIGAN 0.01 % SOLN Place 1 drop into the left eye every evening.     Marland Kitchen LYRICA 50 MG capsule Take 50 mg by mouth daily.     . Multiple Vitamin (MULTIVITAMIN WITH MINERALS) TABS tablet Take 1 tablet by mouth daily.    Marland Kitchen nystatin (MYCOSTATIN/NYSTOP) powder Apply topically 4 (four) times daily. 15 g 0  . ondansetron (ZOFRAN) 4 MG tablet Take 1 tablet (4 mg total) by mouth every 8 (eight) hours as needed for nausea. 30 tablet  1  . pantoprazole (PROTONIX) 40 MG tablet Take 40 mg by mouth daily.    . polyethylene glycol (MIRALAX / GLYCOLAX) 17 g packet Take 5.6 g by mouth at bedtime.    . prochlorperazine (COMPAZINE) 5 MG tablet Take 1 tablet (5 mg total) by mouth every 6 (six) hours as needed for nausea or vomiting. 30 tablet 1  . senna-docusate (SENOKOT-S) 8.6-50 MG tablet Take 2 tablets by mouth 2 (two) times daily. 30 tablet 0  . sevelamer carbonate (RENVELA) 2.4 g PACK Take 2.4 g by mouth 3 (three) times daily with meals. 90 each 0  . sodium chloride (OCEAN) 0.65 % SOLN nasal spray Place 1 spray into both nostrils as needed for congestion.     No current facility-administered medications for this visit.     PHYSICAL EXAMINATION: ECOG PERFORMANCE STATUS: 2 - Symptomatic, <50% confined to bed  Vitals:   04/04/19 0946  BP: (!) 176/51  Pulse: 77  Resp: 18  Temp: 98.7 F (37.1 C)  SpO2: 100%   Filed Weights   04/04/19 0946  Weight: 164 lb 12.8 oz (74.8 kg)    GENERAL:alert, no distress and comfortable SKIN: She has yeast infection under her right breast EYES: normal, Conjunctiva are pink and non-injected, sclera clear OROPHARYNX:no exudate, no erythema and lips, buccal mucosa, and tongue normal  NECK: supple, thyroid normal size, non-tender, without nodularity LYMPH:  no palpable lymphadenopathy in the cervical, axillary or inguinal LUNGS: clear  to auscultation and percussion with normal breathing effort HEART: regular rate & rhythm and no murmurs and no lower extremity edema ABDOMEN:abdomen soft, non-tender and normal bowel sounds Musculoskeletal:no cyanosis of digits and no clubbing  NEURO: alert & oriented x 3 with fluent speech, no focal motor/sensory deficits  LABORATORY DATA:  I have reviewed the data as listed    Component Value Date/Time   NA 136 03/26/2019 0902   K 3.1 (L) 03/26/2019 0902   CL 94 (L) 03/26/2019 0902   CO2 28 03/26/2019 0902   GLUCOSE 84 03/26/2019 0902   BUN 7 (L)  03/26/2019 0902   CREATININE 3.95 (HH) 03/26/2019 0902   CREATININE 1.03 (H) 05/11/2016 0931   CALCIUM 7.5 (L) 03/26/2019 0902   PROT 10.5 (H) 03/26/2019 0902   ALBUMIN 2.3 (L) 03/26/2019 0902   AST 24 03/26/2019 0902   ALT 14 03/26/2019 0902   ALKPHOS 34 (L) 03/26/2019 0902   BILITOT 0.6 03/26/2019 0902   GFRNONAA 10 (L) 03/26/2019 0902   GFRAA 11 (L) 03/26/2019 0902    No results found for: SPEP, UPEP  Lab Results  Component Value Date   WBC 5.9 04/04/2019   NEUTROABS 3.6 04/04/2019   HGB 7.9 (L) 04/04/2019   HCT 25.7 (L) 04/04/2019   MCV 93.5 04/04/2019   PLT 197 04/04/2019      Chemistry      Component Value Date/Time   NA 136 03/26/2019 0902   K 3.1 (L) 03/26/2019 0902   CL 94 (L) 03/26/2019 0902   CO2 28 03/26/2019 0902   BUN 7 (L) 03/26/2019 0902   CREATININE 3.95 (HH) 03/26/2019 0902   CREATININE 1.03 (H) 05/11/2016 0931      Component Value Date/Time   CALCIUM 7.5 (L) 03/26/2019 0902   ALKPHOS 34 (L) 03/26/2019 0902   AST 24 03/26/2019 0902   ALT 14 03/26/2019 0902   BILITOT 0.6 03/26/2019 0902       RADIOGRAPHIC STUDIES: I have personally reviewed the radiological images as listed and agreed with the findings in the report. Ir Fluoro Guide Cv Line Right  Result Date: 03/07/2019 INDICATION: 72 year old with acute kidney injury.  Need for hemodialysis. EXAM: FLUOROSCOPIC AND ULTRASOUND GUIDED PLACEMENT OF A TUNNELED DIALYSIS CATHETER Physician: Stephan Minister. Anselm Pancoast, MD MEDICATIONS: Vancomycin 1 gm IV; The antibiotic was administered within an appropriate time interval prior to skin puncture. ANESTHESIA/SEDATION: Versed 1.0 mg IV; Fentanyl 50 mcg IV; Moderate Sedation Time:  22 minutes The patient was continuously monitored during the procedure by the interventional radiology nurse under my direct supervision. FLUOROSCOPY TIME:  Fluoroscopy Time: 30 seconds, 2 mGy COMPLICATIONS: None immediate. PROCEDURE: Informed consent was obtained for placement of a tunneled  dialysis catheter. The patient was placed supine on the interventional table. Ultrasound confirmed a patent right internal jugular vein. Ultrasound images were obtained for documentation. The right side of the neck and chest was prepped and draped in a sterile fashion. The right neck was anesthetized with 1% lidocaine. Maximal barrier sterile technique was utilized including caps, mask, sterile gowns, sterile gloves, sterile drape, hand hygiene and skin antiseptic. A small incision was made with #11 blade scalpel. A 21 gauge needle directed into the right internal jugular vein with ultrasound guidance. A micropuncture dilator set was placed. A 19 cm tip to cuff Palindrome catheter was selected. The skin below the right clavicle was anesthetized and a small incision was made with an #11 blade scalpel. A subcutaneous tunnel was formed to the vein dermatotomy site.  The catheter was brought through the tunnel. The vein dermatotomy site was dilated to accommodate a peel-away sheath. The catheter was placed through the peel-away sheath and directed into the central venous structures. The tip of the catheter was placed at the superior cavoatrial junction with fluoroscopy. Fluoroscopic images were obtained for documentation. Both lumens were found to aspirate and flush well. The proper amount of heparin was flushed in both lumens. The vein dermatotomy site was closed using a single layer of absorbable suture and Dermabond. Gel-Foam was placed in the subcutaneous tract for hemostasis. The catheter was secured to the skin using Prolene suture. IMPRESSION: Successful placement of a right jugular tunneled dialysis catheter using ultrasound and fluoroscopic guidance. Electronically Signed   By: Markus Daft M.D.   On: 03/07/2019 10:45   Ir US Guide Vasc Access Right  Result Date: 03/07/2019 INDICATION: 37 year old with acute kidney injury.  Need for hemodialysis. EXAM: FLUOROSCOPIC AND ULTRASOUND GUIDED PLACEMENT OF A TUNNELED  DIALYSIS CATHETER Physician: Stephan Minister. Anselm Pancoast, MD MEDICATIONS: Vancomycin 1 gm IV; The antibiotic was administered within an appropriate time interval prior to skin puncture. ANESTHESIA/SEDATION: Versed 1.0 mg IV; Fentanyl 50 mcg IV; Moderate Sedation Time:  22 minutes The patient was continuously monitored during the procedure by the interventional radiology nurse under my direct supervision. FLUOROSCOPY TIME:  Fluoroscopy Time: 30 seconds, 2 mGy COMPLICATIONS: None immediate. PROCEDURE: Informed consent was obtained for placement of a tunneled dialysis catheter. The patient was placed supine on the interventional table. Ultrasound confirmed a patent right internal jugular vein. Ultrasound images were obtained for documentation. The right side of the neck and chest was prepped and draped in a sterile fashion. The right neck was anesthetized with 1% lidocaine. Maximal barrier sterile technique was utilized including caps, mask, sterile gowns, sterile gloves, sterile drape, hand hygiene and skin antiseptic. A small incision was made with #11 blade scalpel. A 21 gauge needle directed into the right internal jugular vein with ultrasound guidance. A micropuncture dilator set was placed. A 19 cm tip to cuff Palindrome catheter was selected. The skin below the right clavicle was anesthetized and a small incision was made with an #11 blade scalpel. A subcutaneous tunnel was formed to the vein dermatotomy site. The catheter was brought through the tunnel. The vein dermatotomy site was dilated to accommodate a peel-away sheath. The catheter was placed through the peel-away sheath and directed into the central venous structures. The tip of the catheter was placed at the superior cavoatrial junction with fluoroscopy. Fluoroscopic images were obtained for documentation. Both lumens were found to aspirate and flush well. The proper amount of heparin was flushed in both lumens. The vein dermatotomy site was closed using a single  layer of absorbable suture and Dermabond. Gel-Foam was placed in the subcutaneous tract for hemostasis. The catheter was secured to the skin using Prolene suture. IMPRESSION: Successful placement of a right jugular tunneled dialysis catheter using ultrasound and fluoroscopic guidance. Electronically Signed   By: Markus Daft M.D.   On: 03/07/2019 10:45   Vas [place] Upper Extremity Arterial Duplex  Result Date: 03/08/2019 UPPER EXTREMITY DUPLEX STUDY Indications: Pre operative for dialysis access.  Comparison Study: No prior study Performing Technologist: Sharion Dove RVS  Examination Guidelines: A complete evaluation includes B-mode imaging, spectral Doppler, color Doppler, and power Doppler as needed of all accessible portions of each vessel. Bilateral testing is considered an integral part of a complete examination. Limited examinations for reoccurring indications may be performed as noted.  Right  Doppler Findings: +--------+----------+---------+-----------+--------+ Site    PSV (cm/s)Waveform Plaque     Comments +--------+----------+---------+-----------+--------+ [id number]       triphasicno stenosis         +--------+----------+---------+-----------+--------+ Radial  83        triphasicno stenosis         +--------+----------+---------+-----------+--------+ Ulnar   68        triphasicno stenosistortuous +--------+----------+---------+-----------+--------+ Right Pre-Dialysis Findings: +-----------------------+----------+--------------------+---------+--------+ Location               PSV (cm/s)Intralum. Diam. (cm)Waveform Comments +-----------------------+----------+--------------------+---------+--------+ Brachial Antecub. fossa          0.60                triphasic         +-----------------------+----------+--------------------+---------+--------+ Radial Art at Wrist              0.33                triphasic          +-----------------------+----------+--------------------+---------+--------+ Ulnar Art at Wrist               0.32                triphasic         +-----------------------+----------+--------------------+---------+--------+ Left Doppler Findings: +--------+----------+---------+-----------+--------+ Site    PSV (cm/s)Waveform Plaque     Comments +--------+----------+---------+-----------+--------+ [id number]        triphasicno stenosis         +--------+----------+---------+-----------+--------+ Radial  182       triphasicno stenosis         +--------+----------+---------+-----------+--------+ Ulnar   133       triphasicno stenosis         +--------+----------+---------+-----------+--------+ Left Pre-Dialysis Findings: +-----------------------+----------+--------------------+---------+--------+ Location               PSV (cm/s)Intralum. Diam. (cm)Waveform Comments +-----------------------+----------+--------------------+---------+--------+ Brachial Antecub. fossa          0.65                triphasic         +-----------------------+----------+--------------------+---------+--------+ Radial Art at Wrist              0.33                triphasic         +-----------------------+----------+--------------------+---------+--------+ Ulnar Art at Wrist               0.22                triphasic         +-----------------------+----------+--------------------+---------+--------+  Summary:  Right: No obstruction visualized in the right upper extremity. Left: No obstruction visualized in the left upper extremity. *See table(s) above for measurements and observations. Electronically signed by [name] MD on 03/08/2019 at 5:49:17 PM.    Final    Vas [place] Upper Ext Vein Mapping (pre-op Avf)  Result Date: 03/08/2019 UPPER EXTREMITY VEIN MAPPING  Indications: Pre-dialysis access. Limitations: Bandages, IV lines Comparison Study: No prior study on file for  comparison Performing Technologist: Sharion Dove RVS  Examination Guidelines: A complete evaluation includes B-mode imaging, spectral Doppler, color Doppler, and power Doppler as needed of all accessible portions of each vessel. Bilateral testing is considered an integral part of a complete examination. Limited examinations for reoccurring indications may be performed as noted. +-----------------+-------------+----------+---------+ Right Cephalic   Diameter (cm)Depth (cm)Findings  +-----------------+-------------+----------+---------+ Prox upper arm       0.50  0.44             +-----------------+-------------+----------+---------+ Mid upper arm        0.58        0.48             +-----------------+-------------+----------+---------+ Dist upper arm       0.60        0.46             +-----------------+-------------+----------+---------+ Antecubital fossa    0.70        0.42             +-----------------+-------------+----------+---------+ Prox forearm         0.42        0.36   branching +-----------------+-------------+----------+---------+ Mid forearm          0.36        0.55   branching +-----------------+-------------+----------+---------+ Wrist                0.29        0.81             +-----------------+-------------+----------+---------+ +--------------+-------------+----------+---------+ Right Basilic Diameter (cm)Depth (cm)Findings  +--------------+-------------+----------+---------+ Dist upper arm                       branching +--------------+-------------+----------+---------+ +-----------------+-------------+----------+--------------+ Left Cephalic    Diameter (cm)Depth (cm)   Findings    +-----------------+-------------+----------+--------------+ Prox upper arm                          not visualized +-----------------+-------------+----------+--------------+ Mid upper arm        0.41        0.66                   +-----------------+-------------+----------+--------------+ Dist upper arm       0.37        0.59     branching    +-----------------+-------------+----------+--------------+ Antecubital fossa    0.53        0.67                  +-----------------+-------------+----------+--------------+ Prox forearm         0.21        0.24                  +-----------------+-------------+----------+--------------+ Mid forearm          0.12        0.29                  +-----------------+-------------+----------+--------------+ Wrist                0.19        0.33                  +-----------------+-------------+----------+--------------+ +-----------------+-------------+----------+----------------------------+ Left Basilic     Diameter (cm)Depth (cm)          Findings           +-----------------+-------------+----------+----------------------------+ Prox upper arm                                 not visualized        +-----------------+-------------+----------+----------------------------+ Mid upper arm        0.49        2.24              origin            +-----------------+-------------+----------+----------------------------+  Dist upper arm       0.52        2.34            branching           +-----------------+-------------+----------+----------------------------+ Antecubital fossa    0.30        0.96                                +-----------------+-------------+----------+----------------------------+ Prox forearm                                   not visualized        +-----------------+-------------+----------+----------------------------+ Mid forearm                             branching and not visualized +-----------------+-------------+----------+----------------------------+ Wrist                                          not visualized        +-----------------+-------------+----------+----------------------------+ *See table(s) above for  measurements and observations.  Diagnosing physician: [name] MD Electronically signed by [name] MD on 03/08/2019 at 5:50:00 PM.    Final     All questions were answered. The patient knows to call the clinic with any problems, questions or concerns. No barriers to learning was detected.  I spent 25 minutes counseling the patient face to face. The total time spent in the appointment was 30 minutes and more than 50% was on counseling and review of test results  Heath Lark, MD 04/04/2019 10:05 AM

## 2019-04-04 NOTE — Assessment & Plan Note (Signed)
She continues to have profound constipation I recommend her to continue taking MiraLAX and Senokot In addition, I will also add lactulose

## 2019-04-04 NOTE — Assessment & Plan Note (Signed)
Overall, her performance status has improved somewhat since last time I saw her We will proceed with reduced dose weekly Velcade  Since she is doing better, I plan to reduce dexamethasone to twice a week Written instruction is given The plan would be to continue minimum 3 months of treatment I anticipate she would have greater than partial response before we consider either adding Revlimid or switching her to maintenance treatment

## 2019-04-04 NOTE — Assessment & Plan Note (Signed)
She has yeast infection under her breast I will prescribe topical nystatin In addition, I will also reduce her dose of dexamethasone

## 2019-04-04 NOTE — Patient Instructions (Signed)
Coronavirus (COVID-19) Are you at risk?  Are you at risk for the Coronavirus (COVID-19)?  To be considered HIGH RISK for Coronavirus (COVID-19), you have to meet the following criteria:  . Traveled to [place], [place], [place], [place] or [place]; or in the [place] to [place], [place], [place], or [place]; and have fever, cough, and shortness of breath within the last 2 weeks of travel OR . Been in close contact with a person diagnosed with COVID-19 within the last 2 weeks and have fever, cough, and shortness of breath . IF YOU DO NOT MEET THESE CRITERIA, YOU ARE CONSIDERED LOW RISK FOR COVID-19.  What to do if you are HIGH RISK for COVID-19?  . If you are having a medical emergency, call 911. . Seek medical care right away. Before you go to a doctor's office, urgent care or emergency department, call ahead and tell them about your recent travel, contact with someone diagnosed with COVID-19, and your symptoms. You should receive instructions from your physician's office regarding next steps of care.  . When you arrive at healthcare provider, tell the healthcare staff immediately you have returned from visiting [place], [place], [place], [place] or [place]; or traveled in the [place] to [place], [place], [place], or [place]; in the last two weeks or you have been in close contact with a person diagnosed with COVID-19 in the last 2 weeks.   . Tell the health care staff about your symptoms: fever, cough and shortness of breath. . After you have been seen by a medical provider, you will be either: o Tested for (COVID-19) and discharged home on quarantine except to seek medical care if symptoms worsen, and asked to  - Stay home and avoid contact with others until you get your results (4-5 days)  - Avoid travel on public transportation if possible (such as bus, train, or airplane) or o Sent to the Emergency Department by EMS for evaluation, COVID-19 testing, and possible  admission depending on your condition and test results.  What to do if you are LOW RISK for COVID-19?  Reduce your risk of any infection by using the same precautions used for avoiding the common cold or flu:  . Wash your hands often with soap and warm water for at least 20 seconds.  If soap and water are not readily available, use an alcohol-based hand sanitizer with at least 60% alcohol.  . If coughing or sneezing, cover your mouth and nose by coughing or sneezing into the elbow areas of your shirt or coat, into a tissue or into your sleeve (not your hands). . Avoid shaking hands with others and consider head nods or verbal greetings only. . Avoid touching your eyes, nose, or mouth with unwashed hands.  . Avoid close contact with people who are sick. . Avoid places or events with large numbers of people in one location, like concerts or sporting events. . Carefully consider travel plans you have or are making. . If you are planning any travel outside or inside the [place], visit the CDC's Travelers' Health webpage for the latest health notices. . If you have some symptoms but not all symptoms, continue to monitor at home and seek medical attention if your symptoms worsen. . If you are having a medical emergency, call 911.   ADDITIONAL HEALTHCARE OPTIONS FOR PATIENTS  [facility] / e-Visit: [email or web address]         [facility]: [phone]  [facility]: [phone]                   [facility]: [phone]    [facility] Discharge Instructions for Patients Receiving Chemotherapy  Today you received the following chemotherapy agents Velcade  To help prevent nausea and vomiting after your treatment, we encourage you to take your nausea medication as directed   If you develop nausea and vomiting that is not controlled by your nausea medication, call the clinic.   BELOW ARE  SYMPTOMS THAT SHOULD BE REPORTED IMMEDIATELY:  *FEVER GREATER THAN 100.5 F  *CHILLS WITH OR WITHOUT FEVER  NAUSEA AND VOMITING THAT IS NOT CONTROLLED WITH YOUR NAUSEA MEDICATION  *UNUSUAL SHORTNESS OF BREATH  *UNUSUAL BRUISING OR BLEEDING  TENDERNESS IN MOUTH AND THROAT WITH OR WITHOUT PRESENCE OF ULCERS  *URINARY PROBLEMS  *BOWEL PROBLEMS  UNUSUAL RASH Items with * indicate a potential emergency and should be followed up as soon as possible.  Feel free to call the clinic should you have any questions or concerns. The clinic phone number is [phone].  Please show the CHEMO ALERT CARD at check-in to the Emergency Department and triage nurse.   

## 2019-04-04 NOTE — Assessment & Plan Note (Signed)
She has no pain and has not taken any prescription pain medicine recently I recommend dexamethasone taper as well

## 2019-04-07 ENCOUNTER — Telehealth: Payer: Self-pay

## 2019-04-07 ENCOUNTER — Telehealth: Payer: Self-pay | Admitting: Hematology and Oncology

## 2019-04-07 DIAGNOSIS — N186 End stage renal disease: Secondary | ICD-10-CM | POA: Diagnosis not present

## 2019-04-07 DIAGNOSIS — E1142 Type 2 diabetes mellitus with diabetic polyneuropathy: Secondary | ICD-10-CM | POA: Diagnosis not present

## 2019-04-07 DIAGNOSIS — C9000 Multiple myeloma not having achieved remission: Secondary | ICD-10-CM | POA: Diagnosis not present

## 2019-04-07 DIAGNOSIS — M19071 Primary osteoarthritis, right ankle and foot: Secondary | ICD-10-CM | POA: Diagnosis not present

## 2019-04-07 DIAGNOSIS — E1122 Type 2 diabetes mellitus with diabetic chronic kidney disease: Secondary | ICD-10-CM | POA: Diagnosis not present

## 2019-04-07 DIAGNOSIS — D631 Anemia in chronic kidney disease: Secondary | ICD-10-CM | POA: Diagnosis not present

## 2019-04-07 DIAGNOSIS — M1030 Gout due to renal impairment, unspecified site: Secondary | ICD-10-CM | POA: Diagnosis not present

## 2019-04-07 DIAGNOSIS — I120 Hypertensive chronic kidney disease with stage 5 chronic kidney disease or end stage renal disease: Secondary | ICD-10-CM | POA: Diagnosis not present

## 2019-04-07 DIAGNOSIS — M170 Bilateral primary osteoarthritis of knee: Secondary | ICD-10-CM | POA: Diagnosis not present

## 2019-04-07 DIAGNOSIS — M130 Polyarthritis, unspecified: Secondary | ICD-10-CM | POA: Diagnosis not present

## 2019-04-07 DIAGNOSIS — D630 Anemia in neoplastic disease: Secondary | ICD-10-CM | POA: Diagnosis not present

## 2019-04-07 LAB — MULTIPLE MYELOMA PANEL, SERUM
Albumin SerPl Elph-Mcnc: 3.4 g/dL (ref 2.9–4.4)
Albumin/Glob SerPl: 0.6 — ABNORMAL LOW (ref 0.7–1.7)
Alpha 1: 0.4 g/dL (ref 0.0–0.4)
Alpha2 Glob SerPl Elph-Mcnc: 0.8 g/dL (ref 0.4–1.0)
B-Globulin SerPl Elph-Mcnc: 5 g/dL — ABNORMAL HIGH (ref 0.7–1.3)
Gamma Glob SerPl Elph-Mcnc: 0.4 g/dL (ref 0.4–1.8)
Globulin, Total: 6.5 g/dL — ABNORMAL HIGH (ref 2.2–3.9)
IgA: 26 mg/dL — ABNORMAL LOW (ref 64–422)
IgG (Immunoglobin G), Serum: 6034 mg/dL — ABNORMAL HIGH (ref 586–1602)
IgM (Immunoglobulin M), Srm: 10 mg/dL — ABNORMAL LOW (ref 26–217)
M Protein SerPl Elph-Mcnc: 4.4 g/dL — ABNORMAL HIGH
Total Protein ELP: 9.9 g/dL — ABNORMAL HIGH (ref 6.0–8.5)

## 2019-04-07 LAB — KAPPA/LAMBDA LIGHT CHAINS
Kappa free light chain: 9986 mg/L — ABNORMAL HIGH (ref 3.3–19.4)
Kappa, lambda light chain ratio: 933.27 — ABNORMAL HIGH (ref 0.26–1.65)
Lambda free light chains: 10.7 mg/L (ref 5.7–26.3)

## 2019-04-07 NOTE — Telephone Encounter (Signed)
-----   Message from Heath Lark, MD sent at 04/07/2019  8:13 AM EDT ----- Regarding: constipation Can you call and check if she has bowel movement yet>?

## 2019-04-07 NOTE — Telephone Encounter (Signed)
If the hard stool is at the rectal vault, the next step would be glycerin supp or Dulcolax supp Both can be purchased over the counter

## 2019-04-07 NOTE — Telephone Encounter (Signed)
Spoke with dtr and pt in the background. She and dtr both state she has been using sennokot, miralax and lactulose all as prescribed but still no BM. Pt states she can feel hard pieces at her rectum. RN advised to stop zofran for now. Please advise.

## 2019-04-07 NOTE — Telephone Encounter (Signed)
Could not reach patient °

## 2019-04-07 NOTE — Telephone Encounter (Signed)
Spoke with pt and gave below information. She states that she just had a very large BM. Pt was instructed to continue bowel regimen as prescribed and to call this office for direction on how to modify bowel regimen if loose stools develop. Pt verbalized understanding.

## 2019-04-09 DIAGNOSIS — I120 Hypertensive chronic kidney disease with stage 5 chronic kidney disease or end stage renal disease: Secondary | ICD-10-CM | POA: Diagnosis not present

## 2019-04-09 DIAGNOSIS — E1142 Type 2 diabetes mellitus with diabetic polyneuropathy: Secondary | ICD-10-CM | POA: Diagnosis not present

## 2019-04-09 DIAGNOSIS — C9000 Multiple myeloma not having achieved remission: Secondary | ICD-10-CM | POA: Diagnosis not present

## 2019-04-09 DIAGNOSIS — E1122 Type 2 diabetes mellitus with diabetic chronic kidney disease: Secondary | ICD-10-CM | POA: Diagnosis not present

## 2019-04-09 DIAGNOSIS — N186 End stage renal disease: Secondary | ICD-10-CM | POA: Diagnosis not present

## 2019-04-09 DIAGNOSIS — D630 Anemia in neoplastic disease: Secondary | ICD-10-CM | POA: Diagnosis not present

## 2019-04-09 DIAGNOSIS — M1030 Gout due to renal impairment, unspecified site: Secondary | ICD-10-CM | POA: Diagnosis not present

## 2019-04-09 DIAGNOSIS — M19071 Primary osteoarthritis, right ankle and foot: Secondary | ICD-10-CM | POA: Diagnosis not present

## 2019-04-09 DIAGNOSIS — M170 Bilateral primary osteoarthritis of knee: Secondary | ICD-10-CM | POA: Diagnosis not present

## 2019-04-09 DIAGNOSIS — D631 Anemia in chronic kidney disease: Secondary | ICD-10-CM | POA: Diagnosis not present

## 2019-04-10 ENCOUNTER — Telehealth: Payer: Self-pay

## 2019-04-10 NOTE — Telephone Encounter (Signed)
Called and given below message. She verbalized understanding. She is feeling better and having bm daily. She is eating better. Still having a lot of pain mostly at night in her hips.

## 2019-04-10 NOTE — Telephone Encounter (Signed)
-----   Message from Heath Lark, MD sent at 04/10/2019  7:47 AM EDT ----- Regarding: constipation She was very constipated last week and finally had a BM after 10 days Can you call and check on her? Remind her of her chemo tomorrow Also, I was doing dexamethasone taper. I hope her bone pain and appetite is still ok

## 2019-04-11 ENCOUNTER — Inpatient Hospital Stay: Payer: Medicare HMO

## 2019-04-11 ENCOUNTER — Encounter: Payer: Self-pay | Admitting: Hematology and Oncology

## 2019-04-11 ENCOUNTER — Other Ambulatory Visit: Payer: Self-pay

## 2019-04-11 VITALS — BP 152/58 | HR 73 | Temp 97.9°F | Resp 18

## 2019-04-11 DIAGNOSIS — D61818 Other pancytopenia: Secondary | ICD-10-CM

## 2019-04-11 DIAGNOSIS — E1122 Type 2 diabetes mellitus with diabetic chronic kidney disease: Secondary | ICD-10-CM | POA: Diagnosis not present

## 2019-04-11 DIAGNOSIS — G893 Neoplasm related pain (acute) (chronic): Secondary | ICD-10-CM | POA: Diagnosis not present

## 2019-04-11 DIAGNOSIS — K5900 Constipation, unspecified: Secondary | ICD-10-CM | POA: Diagnosis not present

## 2019-04-11 DIAGNOSIS — N186 End stage renal disease: Secondary | ICD-10-CM | POA: Diagnosis not present

## 2019-04-11 DIAGNOSIS — Z5111 Encounter for antineoplastic chemotherapy: Secondary | ICD-10-CM | POA: Diagnosis not present

## 2019-04-11 DIAGNOSIS — C9000 Multiple myeloma not having achieved remission: Secondary | ICD-10-CM

## 2019-04-11 DIAGNOSIS — B372 Candidiasis of skin and nail: Secondary | ICD-10-CM | POA: Diagnosis not present

## 2019-04-11 DIAGNOSIS — R918 Other nonspecific abnormal finding of lung field: Secondary | ICD-10-CM | POA: Diagnosis not present

## 2019-04-11 DIAGNOSIS — I120 Hypertensive chronic kidney disease with stage 5 chronic kidney disease or end stage renal disease: Secondary | ICD-10-CM | POA: Diagnosis not present

## 2019-04-11 LAB — CBC WITH DIFFERENTIAL/PLATELET
Abs Immature Granulocytes: 0 10*3/uL (ref 0.00–0.07)
Basophils Absolute: 0 10*3/uL (ref 0.0–0.1)
Basophils Relative: 0 %
Eosinophils Absolute: 0.2 10*3/uL (ref 0.0–0.5)
Eosinophils Relative: 4 %
HCT: 24.8 % — ABNORMAL LOW (ref 36.0–46.0)
Hemoglobin: 7.6 g/dL — ABNORMAL LOW (ref 12.0–15.0)
Immature Granulocytes: 0 %
Lymphocytes Relative: 29 %
Lymphs Abs: 1.3 10*3/uL (ref 0.7–4.0)
MCH: 28.7 pg (ref 26.0–34.0)
MCHC: 30.6 g/dL (ref 30.0–36.0)
MCV: 93.6 fL (ref 80.0–100.0)
Monocytes Absolute: 0.5 10*3/uL (ref 0.1–1.0)
Monocytes Relative: 11 %
Neutro Abs: 2.4 10*3/uL (ref 1.7–7.7)
Neutrophils Relative %: 56 %
Platelets: 147 10*3/uL — ABNORMAL LOW (ref 150–400)
RBC: 2.65 MIL/uL — ABNORMAL LOW (ref 3.87–5.11)
RDW: 19.9 % — ABNORMAL HIGH (ref 11.5–15.5)
WBC: 4.3 10*3/uL (ref 4.0–10.5)
nRBC: 0 % (ref 0.0–0.2)

## 2019-04-11 LAB — COMPREHENSIVE METABOLIC PANEL
ALT: 16 U/L (ref 0–44)
AST: 22 U/L (ref 15–41)
Albumin: 2 g/dL — ABNORMAL LOW (ref 3.5–5.0)
Alkaline Phosphatase: 37 U/L — ABNORMAL LOW (ref 38–126)
Anion gap: 10 (ref 5–15)
BUN: 4 mg/dL — ABNORMAL LOW (ref 8–23)
CO2: 28 mmol/L (ref 22–32)
Calcium: 7.9 mg/dL — ABNORMAL LOW (ref 8.9–10.3)
Chloride: 97 mmol/L — ABNORMAL LOW (ref 98–111)
Creatinine, Ser: 3.15 mg/dL (ref 0.44–1.00)
GFR calc Af Amer: 15 mL/min — ABNORMAL LOW (ref >60)
GFR calc non Af Amer: 13 mL/min — ABNORMAL LOW (ref >60)
Glucose, Bld: 90 mg/dL (ref 70–99)
Potassium: 3.8 mmol/L (ref 3.5–5.1)
Sodium: 135 mmol/L (ref 135–145)
Total Bilirubin: 0.5 mg/dL (ref 0.3–1.2)
Total Protein: 10.3 g/dL — ABNORMAL HIGH (ref 6.5–8.1)

## 2019-04-11 LAB — SAMPLE TO BLOOD BANK

## 2019-04-11 MED ORDER — PROCHLORPERAZINE MALEATE 10 MG PO TABS
10.0000 mg | ORAL_TABLET | Freq: Once | ORAL | Status: AC
  Administered 2019-04-11: 10 mg via ORAL

## 2019-04-11 MED ORDER — BORTEZOMIB CHEMO SQ INJECTION 3.5 MG (2.5MG/ML)
1.0400 mg/m2 | Freq: Once | INTRAMUSCULAR | Status: AC
  Administered 2019-04-11: 2 mg via SUBCUTANEOUS
  Filled 2019-04-11: qty 0.8

## 2019-04-11 MED ORDER — PROCHLORPERAZINE MALEATE 10 MG PO TABS
ORAL_TABLET | ORAL | Status: AC
  Filled 2019-04-11: qty 1

## 2019-04-11 NOTE — Patient Instructions (Signed)
Coronavirus (COVID-19) Are you at risk?  Are you at risk for the Coronavirus (COVID-19)?  To be considered HIGH RISK for Coronavirus (COVID-19), you have to meet the following criteria:  . Traveled to [place], [place], [place], [place] or [place]; or in the [place] to [place], [place], [place], or [place]; and have fever, cough, and shortness of breath within the last 2 weeks of travel OR . Been in close contact with a person diagnosed with COVID-19 within the last 2 weeks and have fever, cough, and shortness of breath . IF YOU DO NOT MEET THESE CRITERIA, YOU ARE CONSIDERED LOW RISK FOR COVID-19.  What to do if you are HIGH RISK for COVID-19?  . If you are having a medical emergency, call 911. . Seek medical care right away. Before you go to a doctor's office, urgent care or emergency department, call ahead and tell them about your recent travel, contact with someone diagnosed with COVID-19, and your symptoms. You should receive instructions from your physician's office regarding next steps of care.  . When you arrive at healthcare provider, tell the healthcare staff immediately you have returned from visiting [place], [place], [place], [place] or [place]; or traveled in the [place] to [place], [place], [place], or [place]; in the last two weeks or you have been in close contact with a person diagnosed with COVID-19 in the last 2 weeks.   . Tell the health care staff about your symptoms: fever, cough and shortness of breath. . After you have been seen by a medical provider, you will be either: o Tested for (COVID-19) and discharged home on quarantine except to seek medical care if symptoms worsen, and asked to  - Stay home and avoid contact with others until you get your results (4-5 days)  - Avoid travel on public transportation if possible (such as bus, train, or airplane) or o Sent to the Emergency Department by EMS for evaluation, COVID-19 testing, and possible  admission depending on your condition and test results.  What to do if you are LOW RISK for COVID-19?  Reduce your risk of any infection by using the same precautions used for avoiding the common cold or flu:  . Wash your hands often with soap and warm water for at least 20 seconds.  If soap and water are not readily available, use an alcohol-based hand sanitizer with at least 60% alcohol.  . If coughing or sneezing, cover your mouth and nose by coughing or sneezing into the elbow areas of your shirt or coat, into a tissue or into your sleeve (not your hands). . Avoid shaking hands with others and consider head nods or verbal greetings only. . Avoid touching your eyes, nose, or mouth with unwashed hands.  . Avoid close contact with people who are sick. . Avoid places or events with large numbers of people in one location, like concerts or sporting events. . Carefully consider travel plans you have or are making. . If you are planning any travel outside or inside the [place], visit the CDC's Travelers' Health webpage for the latest health notices. . If you have some symptoms but not all symptoms, continue to monitor at home and seek medical attention if your symptoms worsen. . If you are having a medical emergency, call 911.   ADDITIONAL HEALTHCARE OPTIONS FOR PATIENTS  [facility] Tele[facility] / e-Visit: [email or web address]         [facility]: [phone]  [facility]: [phone]                   [facility]: [phone]    [facility] Discharge Instructions for Patients Receiving Chemotherapy  Today you received the following chemotherapy agents Velcade  To help prevent nausea and vomiting after your treatment, we encourage you to take your nausea medication as directed   If you develop nausea and vomiting that is not controlled by your nausea medication, call the clinic.   BELOW ARE  SYMPTOMS THAT SHOULD BE REPORTED IMMEDIATELY:  *FEVER GREATER THAN 100.5 F  *CHILLS WITH OR WITHOUT FEVER  NAUSEA AND VOMITING THAT IS NOT CONTROLLED WITH YOUR NAUSEA MEDICATION  *UNUSUAL SHORTNESS OF BREATH  *UNUSUAL BRUISING OR BLEEDING  TENDERNESS IN MOUTH AND THROAT WITH OR WITHOUT PRESENCE OF ULCERS  *URINARY PROBLEMS  *BOWEL PROBLEMS  UNUSUAL RASH Items with * indicate a potential emergency and should be followed up as soon as possible.  Feel free to call the clinic should you have any questions or concerns. The clinic phone number is [phone].  Please show the CHEMO ALERT CARD at check-in to the Emergency Department and triage nurse.   

## 2019-04-11 NOTE — Progress Notes (Signed)
Okay to treat today with hemoglobin of 7.6, per Dr. Alvy Bimler.

## 2019-04-11 NOTE — Progress Notes (Signed)
Pt provided her proof of income to apply for the [place] but unfortunately she exceeds the income requirement.

## 2019-04-12 DIAGNOSIS — E0822 Diabetes mellitus due to underlying condition with diabetic chronic kidney disease: Secondary | ICD-10-CM | POA: Diagnosis not present

## 2019-04-12 DIAGNOSIS — N2581 Secondary hyperparathyroidism of renal origin: Secondary | ICD-10-CM | POA: Diagnosis not present

## 2019-04-12 DIAGNOSIS — D689 Coagulation defect, unspecified: Secondary | ICD-10-CM | POA: Diagnosis not present

## 2019-04-12 DIAGNOSIS — N186 End stage renal disease: Secondary | ICD-10-CM | POA: Diagnosis not present

## 2019-04-12 DIAGNOSIS — D631 Anemia in chronic kidney disease: Secondary | ICD-10-CM | POA: Diagnosis not present

## 2019-04-14 DIAGNOSIS — E1122 Type 2 diabetes mellitus with diabetic chronic kidney disease: Secondary | ICD-10-CM | POA: Diagnosis not present

## 2019-04-14 DIAGNOSIS — D631 Anemia in chronic kidney disease: Secondary | ICD-10-CM | POA: Diagnosis not present

## 2019-04-14 DIAGNOSIS — I120 Hypertensive chronic kidney disease with stage 5 chronic kidney disease or end stage renal disease: Secondary | ICD-10-CM | POA: Diagnosis not present

## 2019-04-14 DIAGNOSIS — D630 Anemia in neoplastic disease: Secondary | ICD-10-CM | POA: Diagnosis not present

## 2019-04-14 DIAGNOSIS — M1030 Gout due to renal impairment, unspecified site: Secondary | ICD-10-CM | POA: Diagnosis not present

## 2019-04-14 DIAGNOSIS — N186 End stage renal disease: Secondary | ICD-10-CM | POA: Diagnosis not present

## 2019-04-14 DIAGNOSIS — C9000 Multiple myeloma not having achieved remission: Secondary | ICD-10-CM | POA: Diagnosis not present

## 2019-04-14 DIAGNOSIS — M170 Bilateral primary osteoarthritis of knee: Secondary | ICD-10-CM | POA: Diagnosis not present

## 2019-04-14 DIAGNOSIS — E1142 Type 2 diabetes mellitus with diabetic polyneuropathy: Secondary | ICD-10-CM | POA: Diagnosis not present

## 2019-04-14 DIAGNOSIS — M19071 Primary osteoarthritis, right ankle and foot: Secondary | ICD-10-CM | POA: Diagnosis not present

## 2019-04-15 DIAGNOSIS — E1122 Type 2 diabetes mellitus with diabetic chronic kidney disease: Secondary | ICD-10-CM | POA: Diagnosis not present

## 2019-04-15 DIAGNOSIS — N186 End stage renal disease: Secondary | ICD-10-CM | POA: Diagnosis not present

## 2019-04-15 DIAGNOSIS — Z992 Dependence on renal dialysis: Secondary | ICD-10-CM | POA: Diagnosis not present

## 2019-04-16 DIAGNOSIS — M1030 Gout due to renal impairment, unspecified site: Secondary | ICD-10-CM | POA: Diagnosis not present

## 2019-04-16 DIAGNOSIS — M170 Bilateral primary osteoarthritis of knee: Secondary | ICD-10-CM | POA: Diagnosis not present

## 2019-04-16 DIAGNOSIS — M19071 Primary osteoarthritis, right ankle and foot: Secondary | ICD-10-CM | POA: Diagnosis not present

## 2019-04-16 DIAGNOSIS — I120 Hypertensive chronic kidney disease with stage 5 chronic kidney disease or end stage renal disease: Secondary | ICD-10-CM | POA: Diagnosis not present

## 2019-04-16 DIAGNOSIS — D630 Anemia in neoplastic disease: Secondary | ICD-10-CM | POA: Diagnosis not present

## 2019-04-16 DIAGNOSIS — D631 Anemia in chronic kidney disease: Secondary | ICD-10-CM | POA: Diagnosis not present

## 2019-04-16 DIAGNOSIS — C9000 Multiple myeloma not having achieved remission: Secondary | ICD-10-CM | POA: Diagnosis not present

## 2019-04-16 DIAGNOSIS — N186 End stage renal disease: Secondary | ICD-10-CM | POA: Diagnosis not present

## 2019-04-16 DIAGNOSIS — E1122 Type 2 diabetes mellitus with diabetic chronic kidney disease: Secondary | ICD-10-CM | POA: Diagnosis not present

## 2019-04-16 DIAGNOSIS — E1142 Type 2 diabetes mellitus with diabetic polyneuropathy: Secondary | ICD-10-CM | POA: Diagnosis not present

## 2019-04-17 ENCOUNTER — Other Ambulatory Visit: Payer: Self-pay | Admitting: Hematology and Oncology

## 2019-04-17 DIAGNOSIS — E0822 Diabetes mellitus due to underlying condition with diabetic chronic kidney disease: Secondary | ICD-10-CM | POA: Diagnosis not present

## 2019-04-17 DIAGNOSIS — D631 Anemia in chronic kidney disease: Secondary | ICD-10-CM | POA: Diagnosis not present

## 2019-04-17 DIAGNOSIS — Z23 Encounter for immunization: Secondary | ICD-10-CM | POA: Diagnosis not present

## 2019-04-17 DIAGNOSIS — N2581 Secondary hyperparathyroidism of renal origin: Secondary | ICD-10-CM | POA: Diagnosis not present

## 2019-04-17 DIAGNOSIS — N186 End stage renal disease: Secondary | ICD-10-CM | POA: Diagnosis not present

## 2019-04-17 DIAGNOSIS — D689 Coagulation defect, unspecified: Secondary | ICD-10-CM | POA: Diagnosis not present

## 2019-04-21 ENCOUNTER — Other Ambulatory Visit: Payer: Self-pay

## 2019-04-21 DIAGNOSIS — E1142 Type 2 diabetes mellitus with diabetic polyneuropathy: Secondary | ICD-10-CM | POA: Diagnosis not present

## 2019-04-21 DIAGNOSIS — Z992 Dependence on renal dialysis: Secondary | ICD-10-CM

## 2019-04-21 DIAGNOSIS — D631 Anemia in chronic kidney disease: Secondary | ICD-10-CM | POA: Diagnosis not present

## 2019-04-21 DIAGNOSIS — C9000 Multiple myeloma not having achieved remission: Secondary | ICD-10-CM | POA: Diagnosis not present

## 2019-04-21 DIAGNOSIS — D630 Anemia in neoplastic disease: Secondary | ICD-10-CM | POA: Diagnosis not present

## 2019-04-21 DIAGNOSIS — M170 Bilateral primary osteoarthritis of knee: Secondary | ICD-10-CM | POA: Diagnosis not present

## 2019-04-21 DIAGNOSIS — N186 End stage renal disease: Secondary | ICD-10-CM | POA: Diagnosis not present

## 2019-04-21 DIAGNOSIS — M1030 Gout due to renal impairment, unspecified site: Secondary | ICD-10-CM | POA: Diagnosis not present

## 2019-04-21 DIAGNOSIS — E1122 Type 2 diabetes mellitus with diabetic chronic kidney disease: Secondary | ICD-10-CM | POA: Diagnosis not present

## 2019-04-21 DIAGNOSIS — M19071 Primary osteoarthritis, right ankle and foot: Secondary | ICD-10-CM | POA: Diagnosis not present

## 2019-04-21 DIAGNOSIS — I120 Hypertensive chronic kidney disease with stage 5 chronic kidney disease or end stage renal disease: Secondary | ICD-10-CM | POA: Diagnosis not present

## 2019-04-23 ENCOUNTER — Ambulatory Visit (INDEPENDENT_AMBULATORY_CARE_PROVIDER_SITE_OTHER): Payer: Self-pay | Admitting: Family

## 2019-04-23 ENCOUNTER — Ambulatory Visit (HOSPITAL_COMMUNITY)
Admission: RE | Admit: 2019-04-23 | Discharge: 2019-04-23 | Disposition: A | Discharge status: Home / Self Care | Payer: Medicare HMO | Source: Ambulatory Visit | Attending: Family | Admitting: Family

## 2019-04-23 ENCOUNTER — Encounter: Payer: Self-pay | Admitting: Family

## 2019-04-23 ENCOUNTER — Other Ambulatory Visit: Payer: Self-pay

## 2019-04-23 VITALS — BP 132/70 | Temp 97.7°F | Resp 12 | Ht 65.0 in | Wt 164.0 lb

## 2019-04-23 DIAGNOSIS — I770 Arteriovenous fistula, acquired: Secondary | ICD-10-CM

## 2019-04-23 DIAGNOSIS — N186 End stage renal disease: Secondary | ICD-10-CM | POA: Diagnosis not present

## 2019-04-23 DIAGNOSIS — Z992 Dependence on renal dialysis: Secondary | ICD-10-CM | POA: Diagnosis not present

## 2019-04-23 NOTE — Progress Notes (Signed)
CC: 6 weeks duplex and follow up s/p right radial cephalic AV fistula creation and superficialization of right radiocephalic AV fistula on 3-54-56   History of Present Illness  [name] is a 85 y.o. (May 21, 1930) female who is s/p right radial cephalic AV fistula creation and superficialization of right radiocephalic AV fistula on 2-56-38 by Dr. Scot Dock. She returns today for AVF duplex.   She dialyzes TTS via right IJ TDC.   She states right hand numbness present for over a year, has numbness in left hand also, right worse than left.  She is right hand dominant.    Past Medical History:  Diagnosis Date  . Benign paroxysmal positional vertigo 04/06/2010  . Benign positional vertigo   . 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 CAROTID BRUIT 02/24/2008  . DIABETES MELLITUS, TYPE II 02/24/2008  . Glaucoma   . HYPERTENSION 02/24/2008  . Obstructive sleep apnea   . Sick sinus syndrome [facility])     Social History Social History   Tobacco Use  . Smoking status: Never Smoker  . Smokeless tobacco: Never Used  Substance Use Topics  . Alcohol use: No  . Drug use: No    Family History Family History  Problem Relation Age of Onset  . Congestive Heart Failure Mother   . Tuberculosis Mother   . Multiple myeloma Mother   . Bone cancer Father   . Arthritis Father   . Breast cancer Sister   . Colon cancer Sister   . Hypertension Sister   . Dementia Sister   . Alcohol abuse Son     Surgical History Past Surgical History:  Procedure Laterality Date  . ABDOMINAL HYSTERECTOMY     1980's  . AV FISTULA PLACEMENT Right 03/11/2019   Procedure: CREATION RIGHT ARM RADIOCEPHALIC ARTERIOVENOUS FISTULA;  Surgeon: Angelia Mould, MD;  Location: [place];  Service: Vascular;  Laterality: Right;  . BACK SURGERY     x 3  . EP IMPLANTABLE DEVICE N/A 05/16/2016   Procedure: PPM Generator Changeout;  Surgeon: Thompson Grayer, MD;  Location: [facility];  Service: Cardiovascular;   Laterality: N/A;  . FISTULA SUPERFICIALIZATION Right 03/11/2019   Procedure: FISTULA SUPERFICIALIZATION;  Surgeon: Angelia Mould, MD;  Location: [facility];  Service: Vascular;  Laterality: Right;  . IR FLUORO GUIDE CV LINE RIGHT  03/07/2019  . IR US GUIDE VASC ACCESS RIGHT  03/07/2019  . KNEE SURGERY Right    2003  . KNEE SURGERY Left    2011  . PACEMAKER INSERTION      Allergies  Allergen Reactions  . Aspirin Other (See Comments)    REACTION: STOMACH ISSUES WITH DOSE HIGHER THAN 81 MG   . Penicillins Rash    Patient took injection and tablets, had a reaction. She has taken amoxicillin with no reaction Has patient had a PCN reaction causing immediate rash, facial/tongue/throat swelling, SOB or lightheadedness with hypotension: Yes Has patient had a PCN reaction causing severe rash involving mucus membranes or skin necrosis: Yes Has patient had a PCN reaction that required hospitalization No Has patient had a PCN reaction occurring within the last 10 years: No If all of the above answers are "NO", then m  . Brimonidine Itching  . Diltiazem Hcl Rash  . Hydrocodone-Acetaminophen Nausea And Vomiting  . Morphine Nausea And Vomiting  . Neurontin [Gabapentin] Nausea And Vomiting    Current Outpatient Medications  Medication Sig Dispense Refill  . allopurinol (ZYLOPRIM) 100 MG tablet  Take 0.5 tablets (50 mg total) by mouth daily. 30 tablet 0  . Coenzyme Q10 (CO Q 10 PO) Take 1 tablet by mouth daily.     Marland Kitchen dexamethasone (DECADRON) 4 MG tablet TAKE 1 TABLET BY MOUTH EVERY DAY 30 tablet 0  . dorzolamide (TRUSOPT) 2 % ophthalmic solution Place 1 drop into the left eye every evening.   1  . fluorometholone (FML) 0.1 % ophthalmic suspension Place 1 drop into the right eye 3 (three) times daily.     Marland Kitchen glimepiride (AMARYL) 1 MG tablet Take 0.5 mg by mouth 2 (two) times daily.     Marland Kitchen HYDROcodone-acetaminophen (NORCO/VICODIN) 5-325 MG tablet Take 1 tablet by mouth every 6 (six) hours as needed  for moderate pain. 30 tablet 0  . isosorbide-hydrALAZINE (BIDIL) 20-37.5 MG tablet Take 1 tablet by mouth 2 (two) times a day. 60 tablet 0  . lactulose (CHRONULAC) 10 GM/15ML solution TAKE 15 MLS (10 G TOTAL) BY MOUTH 2 (TWO) TIMES DAILY. 473 mL 0  . LUMIGAN 0.01 % SOLN Place 1 drop into the left eye every evening.     Marland Kitchen LYRICA 50 MG capsule Take 50 mg by mouth daily.     . Multiple Vitamin (MULTIVITAMIN WITH MINERALS) TABS tablet Take 1 tablet by mouth daily.    Marland Kitchen nystatin (MYCOSTATIN/NYSTOP) powder Apply topically 4 (four) times daily. 15 g 0  . ondansetron (ZOFRAN) 4 MG tablet Take 1 tablet (4 mg total) by mouth every 8 (eight) hours as needed for nausea. 30 tablet 1  . pantoprazole (PROTONIX) 40 MG tablet Take 40 mg by mouth daily.    . polyethylene glycol (MIRALAX / GLYCOLAX) 17 g packet Take 5.6 g by mouth at bedtime.    . prochlorperazine (COMPAZINE) 5 MG tablet Take 1 tablet (5 mg total) by mouth every 6 (six) hours as needed for nausea or vomiting. 30 tablet 1  . senna-docusate (SENOKOT-S) 8.6-50 MG tablet Take 2 tablets by mouth 2 (two) times daily. 30 tablet 0  . sevelamer carbonate (RENVELA) 2.4 g PACK Take 2.4 g by mouth 3 (three) times daily with meals. 90 each 0  . sodium chloride (OCEAN) 0.65 % SOLN nasal spray Place 1 spray into both nostrils as needed for congestion.     No current facility-administered medications for this visit.      REVIEW OF SYSTEMS: see HPI for pertinent positives and negatives    PHYSICAL EXAMINATION:  Vitals:   04/23/19 1051  BP: 132/70  Resp: 12  Temp: 97.7 F (36.5 C)  TempSrc: Temporal  SpO2: 98%  Weight: 164 lb (74.4 kg)  Height: _0  (1.651 m)   Body mass index is 27.29 kg/m.  General: The patient appears her stated age.   HEENT:  No gross abnormalities Pulmonary: Respirations are non-labored Abdomen: Soft and non-tender. Musculoskeletal: There are no major deformities.   Neurologic: No focal weakness or paresthesias are  detected Skin: There are no ulcer or rashes noted. Psychiatric: The patient has normal affect. Cardiovascular: There is a regular rate and rhythm without significant murmur appreciated.  Palpable thrill and audible bruit right forearm AVF, incisions are well healed.  Right IJ TDC.   Non-Invasive Vascular Imaging  Right arm Access Duplex  (Date: 04/23/2019):  Findings: +--------------------+----------+-----------------+--------+ AVF                 PSV (cm/s)Flow Vol (mL/min)Comments +--------------------+----------+-----------------+--------+ Native artery inflow   264          1103                +--------------------+----------+-----------------+--------+  AVF Anastomosis        564                              +--------------------+----------+-----------------+--------+ +------------+----------+-------------+----------+------------------+ OUTFLOW VEINPSV (cm/s)Diameter (cm)Depth (cm)     Describe      +------------+----------+-------------+----------+------------------+ AC Fossa        63        0.73        0.45                      +------------+----------+-------------+----------+------------------+ Prox Forearm   169        0.65        0.36     Retained valve   +------------+----------+-------------+----------+------------------+ Mid Forearm    357        0.51        0.43                      +------------+----------+-------------+----------+------------------+ Dist Forearm   584        0.32        0.86   change in Diameter +------------+----------+-------------+----------+------------------+ Hematoma measuring 1.41 cm x 0.65 cm noted proximal to the anastomosis. Summary: Arteriovenous fistula-Velocities less than 100cm/s noted in the antecubital fossa.  Patent right radiocephalic fistula with no evidence of hemodynamically significant stenosis. Small hematoma noted measuring 1.41 cm x 0.65 cm just proximal to the anastomosis. Valve  leaflets noted in the proximal forearm without elevated velocities.   Medical Decision Making  [name] is a 85 y.o. female who is s/p right radial cephalic AV fistula creation and superficialization of right radiocephalic AV fistula on 06-15-66 by Dr. Scot Dock. Son listened in on visit via pt phone, at pt request.   Dr. Scot Dock reviewed the AVF duplex results with me, pt is doing well, no steal sx's.  AVF should be ready for access in 6 weeks.  Follow up as needed.     Clemon Chambers, RN, MSN, FNP-C [facility] Office: [phone]  04/23/2019, 11:12 AM  Clinic MD: Laqueta Due

## 2019-04-25 ENCOUNTER — Other Ambulatory Visit: Payer: Self-pay

## 2019-04-25 ENCOUNTER — Inpatient Hospital Stay (HOSPITAL_BASED_OUTPATIENT_CLINIC_OR_DEPARTMENT_OTHER): Payer: Medicare HMO | Admitting: Hematology and Oncology

## 2019-04-25 ENCOUNTER — Inpatient Hospital Stay: Payer: Medicare HMO

## 2019-04-25 ENCOUNTER — Inpatient Hospital Stay: Payer: Medicare HMO | Attending: Hematology and Oncology

## 2019-04-25 ENCOUNTER — Encounter: Payer: Self-pay | Admitting: Hematology and Oncology

## 2019-04-25 VITALS — BP 178/50 | HR 95 | Temp 99.6°F | Resp 18 | Ht 65.0 in | Wt 162.4 lb

## 2019-04-25 DIAGNOSIS — K5909 Other constipation: Secondary | ICD-10-CM | POA: Insufficient documentation

## 2019-04-25 DIAGNOSIS — N186 End stage renal disease: Secondary | ICD-10-CM | POA: Insufficient documentation

## 2019-04-25 DIAGNOSIS — D631 Anemia in chronic kidney disease: Secondary | ICD-10-CM

## 2019-04-25 DIAGNOSIS — G893 Neoplasm related pain (acute) (chronic): Secondary | ICD-10-CM | POA: Diagnosis not present

## 2019-04-25 DIAGNOSIS — Z992 Dependence on renal dialysis: Secondary | ICD-10-CM | POA: Insufficient documentation

## 2019-04-25 DIAGNOSIS — Z5111 Encounter for antineoplastic chemotherapy: Secondary | ICD-10-CM | POA: Insufficient documentation

## 2019-04-25 DIAGNOSIS — D61818 Other pancytopenia: Secondary | ICD-10-CM

## 2019-04-25 DIAGNOSIS — Z79899 Other long term (current) drug therapy: Secondary | ICD-10-CM

## 2019-04-25 DIAGNOSIS — C9000 Multiple myeloma not having achieved remission: Secondary | ICD-10-CM | POA: Insufficient documentation

## 2019-04-25 DIAGNOSIS — I120 Hypertensive chronic kidney disease with stage 5 chronic kidney disease or end stage renal disease: Secondary | ICD-10-CM | POA: Diagnosis not present

## 2019-04-25 DIAGNOSIS — E1122 Type 2 diabetes mellitus with diabetic chronic kidney disease: Secondary | ICD-10-CM | POA: Diagnosis not present

## 2019-04-25 DIAGNOSIS — D630 Anemia in neoplastic disease: Secondary | ICD-10-CM | POA: Diagnosis not present

## 2019-04-25 LAB — COMPREHENSIVE METABOLIC PANEL
ALT: 11 U/L (ref 0–44)
AST: 26 U/L (ref 15–41)
Albumin: 1.9 g/dL — ABNORMAL LOW (ref 3.5–5.0)
Alkaline Phosphatase: 44 U/L (ref 38–126)
Anion gap: 10 (ref 5–15)
BUN: 5 mg/dL — ABNORMAL LOW (ref 8–23)
CO2: 28 mmol/L (ref 22–32)
Calcium: 7.6 mg/dL — ABNORMAL LOW (ref 8.9–10.3)
Chloride: 95 mmol/L — ABNORMAL LOW (ref 98–111)
Creatinine, Ser: 3.18 mg/dL (ref 0.44–1.00)
GFR calc Af Amer: 15 mL/min — ABNORMAL LOW (ref >60)
GFR calc non Af Amer: 13 mL/min — ABNORMAL LOW (ref >60)
Glucose, Bld: 94 mg/dL (ref 70–99)
Potassium: 3.7 mmol/L (ref 3.5–5.1)
Sodium: 133 mmol/L — ABNORMAL LOW (ref 135–145)
Total Bilirubin: 0.6 mg/dL (ref 0.3–1.2)
Total Protein: 10.5 g/dL — ABNORMAL HIGH (ref 6.5–8.1)

## 2019-04-25 LAB — CBC WITH DIFFERENTIAL/PLATELET
Abs Immature Granulocytes: 0.01 10*3/uL (ref 0.00–0.07)
Basophils Absolute: 0 10*3/uL (ref 0.0–0.1)
Basophils Relative: 0 %
Eosinophils Absolute: 0.1 10*3/uL (ref 0.0–0.5)
Eosinophils Relative: 2 %
HCT: 23.6 % — ABNORMAL LOW (ref 36.0–46.0)
Hemoglobin: 7.6 g/dL — ABNORMAL LOW (ref 12.0–15.0)
Immature Granulocytes: 0 %
Lymphocytes Relative: 23 %
Lymphs Abs: 1.1 10*3/uL (ref 0.7–4.0)
MCH: 29 pg (ref 26.0–34.0)
MCHC: 32.2 g/dL (ref 30.0–36.0)
MCV: 90.1 fL (ref 80.0–100.0)
Monocytes Absolute: 0.4 10*3/uL (ref 0.1–1.0)
Monocytes Relative: 8 %
Neutro Abs: 3.2 10*3/uL (ref 1.7–7.7)
Neutrophils Relative %: 67 %
Platelets: 170 10*3/uL (ref 150–400)
RBC: 2.62 MIL/uL — ABNORMAL LOW (ref 3.87–5.11)
RDW: 19.9 % — ABNORMAL HIGH (ref 11.5–15.5)
WBC: 4.8 10*3/uL (ref 4.0–10.5)
nRBC: 0 % (ref 0.0–0.2)

## 2019-04-25 LAB — SAMPLE TO BLOOD BANK

## 2019-04-25 MED ORDER — ACYCLOVIR 400 MG PO TABS: 400.0000 mg | ORAL_TABLET | Freq: Every day | ORAL | 6 refills | Status: DC

## 2019-04-25 MED ORDER — BORTEZOMIB CHEMO SQ INJECTION 3.5 MG (2.5MG/ML)
1.0400 mg/m2 | Freq: Once | INTRAMUSCULAR | Status: AC
  Administered 2019-04-25: 2 mg via SUBCUTANEOUS
  Filled 2019-04-25: qty 0.8

## 2019-04-25 MED ORDER — PROCHLORPERAZINE MALEATE 10 MG PO TABS
10.0000 mg | ORAL_TABLET | Freq: Once | ORAL | Status: AC
  Administered 2019-04-25: 11:00:00 10 mg via ORAL

## 2019-04-25 NOTE — Progress Notes (Signed)
Okay to treat with hemoglobin 7.6 today, per Dr. Alvy Bimler.

## 2019-04-25 NOTE — Patient Instructions (Signed)
[facility] Discharge Instructions for Patients Receiving Chemotherapy  Today you received the following chemotherapy agents: bortezomib (Velcade).  To help prevent nausea and vomiting after your treatment, we encourage you to take your nausea medication as prescribed by your physician.    If you develop nausea and vomiting that is not controlled by your nausea medication, call the clinic.   BELOW ARE SYMPTOMS THAT SHOULD BE REPORTED IMMEDIATELY:  *FEVER GREATER THAN 100.5 F  *CHILLS WITH OR WITHOUT FEVER  NAUSEA AND VOMITING THAT IS NOT CONTROLLED WITH YOUR NAUSEA MEDICATION  *UNUSUAL SHORTNESS OF BREATH  *UNUSUAL BRUISING OR BLEEDING  TENDERNESS IN MOUTH AND THROAT WITH OR WITHOUT PRESENCE OF ULCERS  *URINARY PROBLEMS  *BOWEL PROBLEMS  UNUSUAL RASH Items with * indicate a potential emergency and should be followed up as soon as possible.  Feel free to call the clinic should you have any questions or concerns. The clinic phone number is [phone].  Please show the [place] at check-in to the Emergency Department and triage nurse.  Bortezomib injection What is this medicine? BORTEZOMIB (bor TEZ oh mib) is a medicine that targets proteins in cancer cells and stops the cancer cells from growing. It is used to treat multiple myeloma and mantle-cell lymphoma. This medicine may be used for other purposes; ask your health care provider or pharmacist if you have questions. COMMON BRAND NAME(S): Velcade What should I tell my health care provider before I take this medicine? They need to know if you have any of these conditions: -diabetes -heart disease -irregular heartbeat -liver disease -on hemodialysis -low blood counts, like low white blood cells, platelets, or hemoglobin -peripheral neuropathy -taking medicine for blood pressure -an unusual or allergic reaction to bortezomib, mannitol, boron, other medicines, foods, dyes, or  preservatives -pregnant or trying to get pregnant -breast-feeding How should I use this medicine? This medicine is for injection into a vein or for injection under the skin. It is given by a health care professional in a hospital or clinic setting. Talk to your pediatrician regarding the use of this medicine in children. Special care may be needed. Overdosage: If you think you have taken too much of this medicine contact a poison control center or emergency room at once. NOTE: This medicine is only for you. Do not share this medicine with others. What if I miss a dose? It is important not to miss your dose. Call your doctor or health care professional if you are unable to keep an appointment. What may interact with this medicine? This medicine may interact with the following medications: -ketoconazole -rifampin -ritonavir -St. John's Wort This list may not describe all possible interactions. Give your health care provider a list of all the medicines, herbs, non-prescription drugs, or dietary supplements you use. Also tell them if you smoke, drink alcohol, or use illegal drugs. Some items may interact with your medicine. What should I watch for while using this medicine? You may get drowsy or dizzy. Do not drive, use machinery, or do anything that needs mental alertness until you know how this medicine affects you. Do not stand or sit up quickly, especially if you are an older patient. This reduces the risk of dizzy or fainting spells. In some cases, you may be given additional medicines to help with side effects. Follow all directions for their use. Call your doctor or health care professional for advice if you get a fever, chills or sore throat, or other symptoms of a cold  or flu. Do not treat yourself. This drug decreases your body's ability to fight infections. Try to avoid being around people who are sick. This medicine may increase your risk to bruise or bleed. Call your doctor or health  care professional if you notice any unusual bleeding. You may need blood work done while you are taking this medicine. In some patients, this medicine may cause a serious brain infection that may cause death. If you have any problems seeing, thinking, speaking, walking, or standing, tell your doctor right away. If you cannot reach your doctor, urgently seek other source of medical care. Check with your doctor or health care professional if you get an attack of severe diarrhea, nausea and vomiting, or if you sweat a lot. The loss of too much body fluid can make it dangerous for you to take this medicine. Do not become pregnant while taking this medicine or for at least 7 months after stopping it. Women should inform their doctor if they wish to become pregnant or think they might be pregnant. Men should not father a child while taking this medicine and for at least 4 months after stopping it. There is a potential for serious side effects to an unborn child. Talk to your health care professional or pharmacist for more information. Do not breast-feed an infant while taking this medicine or for 2 months after stopping it. This medicine may interfere with the ability to have a child. You should talk with your doctor or health care professional if you are concerned about your fertility. What side effects may I notice from receiving this medicine? Side effects that you should report to your doctor or health care professional as soon as possible: -allergic reactions like skin rash, itching or hives, swelling of the face, lips, or tongue -breathing problems -changes in hearing -changes in vision -fast, irregular heartbeat -feeling faint or lightheaded, falls -pain, tingling, numbness in the hands or feet -right upper belly pain -seizures -swelling of the ankles, feet, hands -unusual bleeding or bruising -unusually weak or tired -vomiting -yellowing of the eyes or skin Side effects that usually do not  require medical attention (report to your doctor or health care professional if they continue or are bothersome): -changes in emotions or moods -constipation -diarrhea -loss of appetite -headache -irritation at site where injected -nausea This list may not describe all possible side effects. Call your doctor for medical advice about side effects. You may report side effects to FDA at 1-800-FDA-1088. Where should I keep my medicine? This drug is given in a hospital or clinic and will not be stored at home. NOTE: This sheet is a summary. It may not cover all possible information. If you have questions about this medicine, talk to your doctor, pharmacist, or health care provider.  2019 Elsevier/Gold Standard (2018-02-11 16:29:31)

## 2019-04-25 NOTE — Assessment & Plan Note (Signed)
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 I will defer to her nephrologist for further management

## 2019-04-25 NOTE — Assessment & Plan Note (Signed)
She will continue hemodialysis on Tuesdays, Thursdays and Saturdays

## 2019-04-25 NOTE — Assessment & Plan Note (Signed)
She continues to have severe constipation and had no bowel movement for 5 days She is starting to feel uncomfortable because she felt that the stool is in the rectal vault I recommend trial of suppository at home.

## 2019-04-25 NOTE — Progress Notes (Signed)
[facility] OFFICE PROGRESS NOTE  Patient Care Team: Lucianne Lei, MD as PCP - General (Family Medicine)  ASSESSMENT & PLAN:  Multiple myeloma not having achieved remission ([place]) Overall, her performance status has improved somewhat since last time I saw her We will proceed with reduced dose weekly Velcade  Since she is doing better, she will continue on reduced dexamethasone twice a week The plan would be to continue minimum 3 months of treatment I anticipate she would have greater than partial response before we consider either adding Revlimid or switching her to maintenance treatment Due to severe renal dysfunction and inability to get dental clearance, she is not receiving Zometa I recommend acyclovir for antimicrobial prophylaxis  ESRD (end stage renal disease) on dialysis [facility]) She will continue hemodialysis on Tuesdays, Thursdays and Saturdays  Cancer associated pain She has minimum bone pain and has not taken any pain medicine recently We will observe carefully  Other constipation She continues to have severe constipation and had no bowel movement for 5 days She is starting to feel uncomfortable because she felt that the stool is in the rectal vault I recommend trial of suppository at home.  Pancytopenia, acquired [facility]) 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 I will defer to her nephrologist for further management   No orders of the defined types were placed in this encounter.   INTERVAL HISTORY: Please see below for problem oriented charting. She returns for chemotherapy and follow-up She is profoundly constipated with no bowel movement for the last 5 days Denies nausea She has no bone pain Denies peripheral neuropathy  SUMMARY OF ONCOLOGIC HISTORY: Oncology History  Multiple myeloma not having achieved remission ([place])  03/03/2019 Initial Diagnosis   Multiple myeloma not having achieved remission ([place])   03/03/2019 Imaging   1. Round  and oval lucent/lytic lesions in the skull, bilateral humeri, distal left femur, and possibly also in the pelvis are compatible with multiple myeloma. No right lower extremity lytic lesion identified. No pathologic fracture. 2. Advanced degenerative changes in the spine with no vertebral myeloma evident by plain radiography. 3. Advanced degenerative changes in the right upper extremity. Bilateral knee arthroplasty. 4.  Aortic Atherosclerosis (ICD10-I70.0).   03/04/2019 -  Chemotherapy   The patient had bortezomib for chemotherapy treatment.    03/04/2019 Bone Marrow Biopsy   Bone Marrow, Aspirate,Biopsy, and Clot - HYPERCELLULAR BONE MARROW FOR AGE WITH PLASMACYTOSIS. - SEE COMMENT. PERIPHERAL BLOOD: - NORMOCYTIC-NORMOCHROMIC ANEMIA. Diagnosis Note The bone marrow is hypercellular for age with increased number of atypical plasma cells averaging 30% of all cells in the aspirate associated with interstitial infiltrates and numerous variably sized aggregates in the clot and biopsy sections. 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 Confirmatory in situ hybridization for kappa and lambda as well as CD138 will be performed and the results reported in an addendum. The background shows trilineage hematopoiesis with mild non-specific changes primarily involving the erythroid series. Correlation with cytogenetic and FISH studies is recommended.      REVIEW OF SYSTEMS:   Constitutional: Denies fevers, chills or abnormal weight loss Eyes: Denies blurriness of vision Ears, nose, mouth, throat, and face: Denies mucositis or sore throat Respiratory: Denies cough, dyspnea or wheezes Cardiovascular: Denies palpitation, chest discomfort or lower extremity swelling Gastrointestinal:  Denies nausea, heartburn or change in bowel habits Skin: Denies abnormal skin rashes Lymphatics: Denies new lymphadenopathy or easy bruising Neurological:Denies numbness, tingling or new  weaknesses Behavioral/Psych: Mood is stable, no new changes  All other systems were reviewed with the patient and are negative.  I have reviewed the past medical history, past surgical history, social history and family history with the patient and they are unchanged from previous note.  ALLERGIES:  is allergic to aspirin; penicillins; brimonidine; diltiazem hcl; hydrocodone-acetaminophen; morphine; and neurontin [gabapentin].  MEDICATIONS:  Current Outpatient Medications  Medication Sig Dispense Refill  . acyclovir (ZOVIRAX) 400 MG tablet Take 1 tablet (400 mg total) by mouth daily. 30 tablet 6  . allopurinol (ZYLOPRIM) 100 MG tablet Take 0.5 tablets (50 mg total) by mouth daily. 30 tablet 0  . Coenzyme Q10 (CO Q 10 PO) Take 1 tablet by mouth daily.     Marland Kitchen dexamethasone (DECADRON) 4 MG tablet TAKE 1 TABLET BY MOUTH EVERY DAY 30 tablet 0  . dorzolamide (TRUSOPT) 2 % ophthalmic solution Place 1 drop into the left eye every evening.   1  . fluorometholone (FML) 0.1 % ophthalmic suspension Place 1 drop into the right eye 3 (three) times daily.     Marland Kitchen glimepiride (AMARYL) 1 MG tablet Take 0.5 mg by mouth 2 (two) times daily.     Marland Kitchen HYDROcodone-acetaminophen (NORCO/VICODIN) 5-325 MG tablet Take 1 tablet by mouth every 6 (six) hours as needed for moderate pain. 30 tablet 0  . isosorbide-hydrALAZINE (BIDIL) 20-37.5 MG tablet Take 1 tablet by mouth 2 (two) times a day. 60 tablet 0  . lactulose (CHRONULAC) 10 GM/15ML solution TAKE 15 MLS (10 G TOTAL) BY MOUTH 2 (TWO) TIMES DAILY. 473 mL 0  . LUMIGAN 0.01 % SOLN Place 1 drop into the left eye every evening.     Marland Kitchen LYRICA 50 MG capsule Take 50 mg by mouth daily.     . Multiple Vitamin (MULTIVITAMIN WITH MINERALS) TABS tablet Take 1 tablet by mouth daily.    Marland Kitchen nystatin (MYCOSTATIN/NYSTOP) powder Apply topically 4 (four) times daily. 15 g 0  . ondansetron (ZOFRAN) 4 MG tablet Take 1 tablet (4 mg total) by mouth every 8 (eight) hours as needed for nausea. 30  tablet 1  . pantoprazole (PROTONIX) 40 MG tablet Take 40 mg by mouth daily.    . polyethylene glycol (MIRALAX / GLYCOLAX) 17 g packet Take 5.6 g by mouth at bedtime.    . prochlorperazine (COMPAZINE) 5 MG tablet Take 1 tablet (5 mg total) by mouth every 6 (six) hours as needed for nausea or vomiting. 30 tablet 1  . senna-docusate (SENOKOT-S) 8.6-50 MG tablet Take 2 tablets by mouth 2 (two) times daily. 30 tablet 0  . sevelamer carbonate (RENVELA) 2.4 g PACK Take 2.4 g by mouth 3 (three) times daily with meals. 90 each 0  . sodium chloride (OCEAN) 0.65 % SOLN nasal spray Place 1 spray into both nostrils as needed for congestion.     No current facility-administered medications for this visit.     PHYSICAL EXAMINATION: ECOG PERFORMANCE STATUS: 2 - Symptomatic, <50% confined to bed  Vitals:   04/25/19 0927  BP: (!) 178/50  Pulse: 95  Resp: 18  Temp: 99.6 F (37.6 C)  SpO2: 100%   Filed Weights   04/25/19 0927  Weight: 162 lb 6.4 oz (73.7 kg)    GENERAL:alert, no distress and comfortable SKIN: skin color, texture, turgor are normal, no rashes or significant lesions EYES: normal, Conjunctiva are pink and non-injected, sclera clear OROPHARYNX:no exudate, no erythema and lips, buccal mucosa, and tongue normal  NECK: supple, thyroid normal size, non-tender, without nodularity LYMPH:  no palpable lymphadenopathy in  the cervical, axillary or inguinal LUNGS: clear to auscultation and percussion with normal breathing effort HEART: regular rate & rhythm and no murmurs and no lower extremity edema ABDOMEN:abdomen soft, non-tender and normal bowel sounds Musculoskeletal:no cyanosis of digits and no clubbing  NEURO: alert & oriented x 3 with fluent speech, no focal motor/sensory deficits  LABORATORY DATA:  I have reviewed the data as listed    Component Value Date/Time   NA 133 (L) 04/25/2019 0841   K 3.7 04/25/2019 0841   CL 95 (L) 04/25/2019 0841   CO2 28 04/25/2019 0841   GLUCOSE  94 04/25/2019 0841   BUN 5 (L) 04/25/2019 0841   CREATININE 3.18 (HH) 04/25/2019 0841   CREATININE 1.03 (H) 05/11/2016 0931   CALCIUM 7.6 (L) 04/25/2019 0841   PROT 10.5 (H) 04/25/2019 0841   ALBUMIN 1.9 (L) 04/25/2019 0841   AST 26 04/25/2019 0841   ALT 11 04/25/2019 0841   ALKPHOS 44 04/25/2019 0841   BILITOT 0.6 04/25/2019 0841   GFRNONAA 13 (L) 04/25/2019 0841   GFRAA 15 (L) 04/25/2019 0841    No results found for: SPEP, UPEP  Lab Results  Component Value Date   WBC 4.8 04/25/2019   NEUTROABS 3.2 04/25/2019   HGB 7.6 (L) 04/25/2019   HCT 23.6 (L) 04/25/2019   MCV 90.1 04/25/2019   PLT 170 04/25/2019      Chemistry      Component Value Date/Time   NA 133 (L) 04/25/2019 0841   K 3.7 04/25/2019 0841   CL 95 (L) 04/25/2019 0841   CO2 28 04/25/2019 0841   BUN 5 (L) 04/25/2019 0841   CREATININE 3.18 (HH) 04/25/2019 0841   CREATININE 1.03 (H) 05/11/2016 0931      Component Value Date/Time   CALCIUM 7.6 (L) 04/25/2019 0841   ALKPHOS 44 04/25/2019 0841   AST 26 04/25/2019 0841   ALT 11 04/25/2019 0841   BILITOT 0.6 04/25/2019 0841       RADIOGRAPHIC STUDIES: I have personally reviewed the radiological images as listed and agreed with the findings in the report. Vas US Duplex Dialysis Access (avf, Avg)  Result Date: 04/23/2019 DIALYSIS ACCESS Reason for Exam: Routine follow up. Access Site: Right Upper Extremity. Access Type: Radial-cephalic AVF. History: 03/11/2019: Right radial cephalic AV fistula with subsequent          Superficialization of right radiocephalic AV fistula. Performing Technologist: Burley Saver RVT Supporting Technologist: Ralene Cork RVT  Examination Guidelines: A complete evaluation includes B-mode imaging, spectral Doppler, color Doppler, and power Doppler as needed of all accessible portions of each vessel. Unilateral testing is considered an integral part of a complete examination. Limited examinations for reoccurring indications may be  performed as noted.  Findings: +--------------------+----------+-----------------+--------+ AVF                 PSV (cm/s)Flow Vol (mL/min)Comments +--------------------+----------+-----------------+--------+ Native artery inflow   264          1103                +--------------------+----------+-----------------+--------+ AVF Anastomosis        564                              +--------------------+----------+-----------------+--------+  +------------+----------+-------------+----------+------------------+ OUTFLOW VEINPSV (cm/s)Diameter (cm)Depth (cm)     Describe      +------------+----------+-------------+----------+------------------+ AC Fossa        63  0.73        0.45                      +------------+----------+-------------+----------+------------------+ Prox Forearm   169        0.65        0.36     Retained valve   +------------+----------+-------------+----------+------------------+ Mid Forearm    357        0.51        0.43                      +------------+----------+-------------+----------+------------------+ Dist Forearm   584        0.32        0.86   change in Diameter +------------+----------+-------------+----------+------------------+ Hematoma measuring 1.41 cm x 0.65 cm noted proximal to the anastomosis.   Summary: Arteriovenous fistula-Velocities less than 100cm/s noted in the antecubital fossa.  Patent right radiocephalic fistula with no evidence of hemodynamically significant stenosis. Small hematoma noted measuring 1.41 cm x 0.65 cm just proximal to the anastomosis. Valve leaflets noted in the proximal forearm without elevated velocities.  *See table(s) above for measurements and observations.  Diagnosing physician: [name] MD Electronically signed by [name] MD on 04/23/2019 at 11:16:15 AM.    --------------------------------------------------------------------------------   Final     All questions were  answered. The patient knows to call the clinic with any problems, questions or concerns. No barriers to learning was detected.  I spent 15 minutes counseling the patient face to face. The total time spent in the appointment was 20 minutes and more than 50% was on counseling and review of test results  Heath Lark, MD 04/25/2019 3:34 PM

## 2019-04-25 NOTE — Assessment & Plan Note (Signed)
She has minimum bone pain and has not taken any pain medicine recently We will observe carefully

## 2019-04-25 NOTE — Assessment & Plan Note (Addendum)
Overall, her performance status has improved somewhat since last time I saw her We will proceed with reduced dose weekly Velcade  Since she is doing better, she will continue on reduced dexamethasone twice a week The plan would be to continue minimum 3 months of treatment I anticipate she would have greater than partial response before we consider either adding Revlimid or switching her to maintenance treatment Due to severe renal dysfunction and inability to get dental clearance, she is not receiving Zometa I recommend acyclovir for antimicrobial prophylaxis

## 2019-04-28 LAB — KAPPA/LAMBDA LIGHT CHAINS
Kappa free light chain: 8396 mg/L — ABNORMAL HIGH (ref 3.3–19.4)
Kappa, lambda light chain ratio: 693.88 — ABNORMAL HIGH (ref 0.26–1.65)
Lambda free light chains: 12.1 mg/L (ref 5.7–26.3)

## 2019-04-29 LAB — MULTIPLE MYELOMA PANEL, SERUM
Albumin SerPl Elph-Mcnc: 2.9 g/dL (ref 2.9–4.4)
Albumin/Glob SerPl: 0.5 — ABNORMAL LOW (ref 0.7–1.7)
Alpha 1: 0.3 g/dL (ref 0.0–0.4)
Alpha2 Glob SerPl Elph-Mcnc: 0.6 g/dL (ref 0.4–1.0)
B-Globulin SerPl Elph-Mcnc: 5.6 g/dL — ABNORMAL HIGH (ref 0.7–1.3)
Gamma Glob SerPl Elph-Mcnc: 0.3 g/dL — ABNORMAL LOW (ref 0.4–1.8)
Globulin, Total: 6.8 g/dL — ABNORMAL HIGH (ref 2.2–3.9)
IgA: 22 mg/dL — ABNORMAL LOW (ref 64–422)
IgG (Immunoglobin G), Serum: 6282 mg/dL — ABNORMAL HIGH (ref 586–1602)
IgM (Immunoglobulin M), Srm: 10 mg/dL — ABNORMAL LOW (ref 26–217)
M Protein SerPl Elph-Mcnc: 3.7 g/dL — ABNORMAL HIGH
Total Protein ELP: 9.7 g/dL — ABNORMAL HIGH (ref 6.0–8.5)

## 2019-04-30 DIAGNOSIS — I120 Hypertensive chronic kidney disease with stage 5 chronic kidney disease or end stage renal disease: Secondary | ICD-10-CM | POA: Diagnosis not present

## 2019-04-30 DIAGNOSIS — M170 Bilateral primary osteoarthritis of knee: Secondary | ICD-10-CM | POA: Diagnosis not present

## 2019-04-30 DIAGNOSIS — E1142 Type 2 diabetes mellitus with diabetic polyneuropathy: Secondary | ICD-10-CM | POA: Diagnosis not present

## 2019-04-30 DIAGNOSIS — D631 Anemia in chronic kidney disease: Secondary | ICD-10-CM | POA: Diagnosis not present

## 2019-04-30 DIAGNOSIS — E1122 Type 2 diabetes mellitus with diabetic chronic kidney disease: Secondary | ICD-10-CM | POA: Diagnosis not present

## 2019-04-30 DIAGNOSIS — C9000 Multiple myeloma not having achieved remission: Secondary | ICD-10-CM | POA: Diagnosis not present

## 2019-04-30 DIAGNOSIS — N186 End stage renal disease: Secondary | ICD-10-CM | POA: Diagnosis not present

## 2019-04-30 DIAGNOSIS — D630 Anemia in neoplastic disease: Secondary | ICD-10-CM | POA: Diagnosis not present

## 2019-04-30 DIAGNOSIS — M19071 Primary osteoarthritis, right ankle and foot: Secondary | ICD-10-CM | POA: Diagnosis not present

## 2019-04-30 DIAGNOSIS — M1030 Gout due to renal impairment, unspecified site: Secondary | ICD-10-CM | POA: Diagnosis not present

## 2019-05-02 ENCOUNTER — Inpatient Hospital Stay: Payer: Medicare HMO

## 2019-05-02 ENCOUNTER — Other Ambulatory Visit: Payer: Self-pay

## 2019-05-02 VITALS — BP 158/59 | HR 82 | Temp 99.1°F | Resp 20

## 2019-05-02 DIAGNOSIS — Z5111 Encounter for antineoplastic chemotherapy: Secondary | ICD-10-CM | POA: Diagnosis not present

## 2019-05-02 DIAGNOSIS — C9000 Multiple myeloma not having achieved remission: Secondary | ICD-10-CM

## 2019-05-02 DIAGNOSIS — K5909 Other constipation: Secondary | ICD-10-CM | POA: Diagnosis not present

## 2019-05-02 DIAGNOSIS — Z79899 Other long term (current) drug therapy: Secondary | ICD-10-CM | POA: Diagnosis not present

## 2019-05-02 DIAGNOSIS — D61818 Other pancytopenia: Secondary | ICD-10-CM | POA: Diagnosis not present

## 2019-05-02 DIAGNOSIS — Z992 Dependence on renal dialysis: Secondary | ICD-10-CM | POA: Diagnosis not present

## 2019-05-02 DIAGNOSIS — N186 End stage renal disease: Secondary | ICD-10-CM | POA: Diagnosis not present

## 2019-05-02 DIAGNOSIS — D631 Anemia in chronic kidney disease: Secondary | ICD-10-CM | POA: Diagnosis not present

## 2019-05-02 DIAGNOSIS — G893 Neoplasm related pain (acute) (chronic): Secondary | ICD-10-CM | POA: Diagnosis not present

## 2019-05-02 LAB — CBC WITH DIFFERENTIAL/PLATELET
Abs Immature Granulocytes: 0.01 10*3/uL (ref 0.00–0.07)
Basophils Absolute: 0 10*3/uL (ref 0.0–0.1)
Basophils Relative: 0 %
Eosinophils Absolute: 0.1 10*3/uL (ref 0.0–0.5)
Eosinophils Relative: 2 %
HCT: 24 % — ABNORMAL LOW (ref 36.0–46.0)
Hemoglobin: 7.5 g/dL — ABNORMAL LOW (ref 12.0–15.0)
Immature Granulocytes: 0 %
Lymphocytes Relative: 36 %
Lymphs Abs: 1.8 10*3/uL (ref 0.7–4.0)
MCH: 28.6 pg (ref 26.0–34.0)
MCHC: 31.3 g/dL (ref 30.0–36.0)
MCV: 91.6 fL (ref 80.0–100.0)
Monocytes Absolute: 0.5 10*3/uL (ref 0.1–1.0)
Monocytes Relative: 9 %
Neutro Abs: 2.7 10*3/uL (ref 1.7–7.7)
Neutrophils Relative %: 53 %
Platelets: 175 10*3/uL (ref 150–400)
RBC: 2.62 MIL/uL — ABNORMAL LOW (ref 3.87–5.11)
RDW: 20.2 % — ABNORMAL HIGH (ref 11.5–15.5)
WBC: 5.2 10*3/uL (ref 4.0–10.5)
nRBC: 0 % (ref 0.0–0.2)

## 2019-05-02 LAB — COMPREHENSIVE METABOLIC PANEL
ALT: 10 U/L (ref 0–44)
AST: 16 U/L (ref 15–41)
Albumin: 1.8 g/dL — ABNORMAL LOW (ref 3.5–5.0)
Alkaline Phosphatase: 45 U/L (ref 38–126)
Anion gap: 9 (ref 5–15)
BUN: <4 mg/dL — ABNORMAL LOW (ref 8–23)
CO2: 28 mmol/L (ref 22–32)
Calcium: 7.6 mg/dL — ABNORMAL LOW (ref 8.9–10.3)
Chloride: 96 mmol/L — ABNORMAL LOW (ref 98–111)
Creatinine, Ser: 2.49 mg/dL — ABNORMAL HIGH (ref 0.44–1.00)
GFR calc Af Amer: 20 mL/min — ABNORMAL LOW (ref >60)
GFR calc non Af Amer: 17 mL/min — ABNORMAL LOW (ref >60)
Glucose, Bld: 95 mg/dL (ref 70–99)
Potassium: 3.8 mmol/L (ref 3.5–5.1)
Sodium: 133 mmol/L — ABNORMAL LOW (ref 135–145)
Total Bilirubin: 0.4 mg/dL (ref 0.3–1.2)
Total Protein: 10.2 g/dL — ABNORMAL HIGH (ref 6.5–8.1)

## 2019-05-02 LAB — SAMPLE TO BLOOD BANK

## 2019-05-02 MED ORDER — PROCHLORPERAZINE MALEATE 10 MG PO TABS
10.0000 mg | ORAL_TABLET | Freq: Once | ORAL | Status: AC
  Administered 2019-05-02: 10 mg via ORAL

## 2019-05-02 MED ORDER — BORTEZOMIB CHEMO SQ INJECTION 3.5 MG (2.5MG/ML)
1.0400 mg/m2 | Freq: Once | INTRAMUSCULAR | Status: AC
  Administered 2019-05-02: 11:00:00 2 mg via SUBCUTANEOUS
  Filled 2019-05-02 (×4): qty 0.8

## 2019-05-02 MED ORDER — PROCHLORPERAZINE MALEATE 10 MG PO TABS
ORAL_TABLET | ORAL | Status: AC
  Filled 2019-05-02: qty 1

## 2019-05-02 NOTE — Patient Instructions (Signed)
[facility] Discharge Instructions for Patients Receiving Chemotherapy  Today you received the following chemotherapy agents: Bortezomib (VELCADE).  To help prevent nausea and vomiting after your treatment, we encourage you to take your nausea medication as prescribed.   If you develop nausea and vomiting that is not controlled by your nausea medication, call the clinic.   BELOW ARE SYMPTOMS THAT SHOULD BE REPORTED IMMEDIATELY:  *FEVER GREATER THAN 100.5 F  *CHILLS WITH OR WITHOUT FEVER  NAUSEA AND VOMITING THAT IS NOT CONTROLLED WITH YOUR NAUSEA MEDICATION  *UNUSUAL SHORTNESS OF BREATH  *UNUSUAL BRUISING OR BLEEDING  TENDERNESS IN MOUTH AND THROAT WITH OR WITHOUT PRESENCE OF ULCERS  *URINARY PROBLEMS  *BOWEL PROBLEMS  UNUSUAL RASH Items with * indicate a potential emergency and should be followed up as soon as possible.  Feel free to call the clinic should you have any questions or concerns. The clinic phone number is [phone].  Please show the CHEMO ALERT CARD at check-in to the Emergency Department and triage nurse.   Coronavirus (COVID-19) Are you at risk?  Are you at risk for the Coronavirus (COVID-19)?  To be considered HIGH RISK for Coronavirus (COVID-19), you have to meet the following criteria:  . Traveled to [place], [place], [place], [place] or [place]; or in the [place] to [place], [place], [place], or [place]; and have fever, cough, and shortness of breath within the last 2 weeks of travel OR . Been in close contact with a person diagnosed with COVID-19 within the last 2 weeks and have fever, cough, and shortness of breath . IF YOU DO NOT MEET THESE CRITERIA, YOU ARE CONSIDERED LOW RISK FOR COVID-19.  What to do if you are HIGH RISK for COVID-19?  . If you are having a medical emergency, call 911. . Seek medical care right away. Before you go to a doctor's office, urgent care or emergency department, call ahead and tell them  about your recent travel, contact with someone diagnosed with COVID-19, and your symptoms. You should receive instructions from your physician's office regarding next steps of care.  . When you arrive at healthcare provider, tell the healthcare staff immediately you have returned from visiting [place], [place], [place], [place] or [place]; or traveled in the [place] to [place], [place], [place], or [place]; in the last two weeks or you have been in close contact with a person diagnosed with COVID-19 in the last 2 weeks.   . Tell the health care staff about your symptoms: fever, cough and shortness of breath. . After you have been seen by a medical provider, you will be either: o Tested for (COVID-19) and discharged home on quarantine except to seek medical care if symptoms worsen, and asked to  - Stay home and avoid contact with others until you get your results (4-5 days)  - Avoid travel on public transportation if possible (such as bus, train, or airplane) or o Sent to the Emergency Department by EMS for evaluation, COVID-19 testing, and possible admission depending on your condition and test results.  What to do if you are LOW RISK for COVID-19?  Reduce your risk of any infection by using the same precautions used for avoiding the common cold or flu:  . Wash your hands often with soap and warm water for at least 20 seconds.  If soap and water are not readily available, use an alcohol-based hand sanitizer with at least 60% alcohol.  . If coughing   or sneezing, cover your mouth and nose by coughing or sneezing into the elbow areas of your shirt or coat, into a tissue or into your sleeve (not your hands). . Avoid shaking hands with others and consider head nods or verbal greetings only. . Avoid touching your eyes, nose, or mouth with unwashed hands.  . Avoid close contact with people who are sick. . Avoid places or events with large numbers of people in one location, like concerts or  sporting events. . Carefully consider travel plans you have or are making. . If you are planning any travel outside or inside the [place], visit the CDC's Travelers' Health webpage for the latest health notices. . If you have some symptoms but not all symptoms, continue to monitor at home and seek medical attention if your symptoms worsen. . If you are having a medical emergency, call 911.   ADDITIONAL HEALTHCARE OPTIONS FOR PATIENTS  [place] Telehealth / e-Visit: [email or web address]         [facility]: [phone]  [facility]: [phone]                   [facility]: [phone]   

## 2019-05-08 ENCOUNTER — Ambulatory Visit (INDEPENDENT_AMBULATORY_CARE_PROVIDER_SITE_OTHER): Payer: Medicare HMO | Admitting: *Deleted

## 2019-05-08 DIAGNOSIS — I495 Sick sinus syndrome: Secondary | ICD-10-CM

## 2019-05-08 LAB — CUP PACEART REMOTE DEVICE CHECK
Battery Remaining Longevity: 85 mo
Battery Remaining Percentage: 95.5 %
Battery Voltage: 2.98 V
Brady Statistic AP VP Percent: 90 %
Brady Statistic AP VS Percent: 1.5 %
Brady Statistic AS VP Percent: 2.7 %
Brady Statistic AS VS Percent: 5.4 %
Brady Statistic RA Percent Paced: 92 %
Brady Statistic RV Percent Paced: 93 %
Date Time Interrogation Session: 20200723074754
Implantable Lead Implant Date: 20100422
Implantable Lead Implant Date: 20100422
Implantable Lead Location: 753859
Implantable Lead Location: 753860
Implantable Pulse Generator Implant Date: 20170801
Lead Channel Impedance Value: 340 Ohm
Lead Channel Impedance Value: 350 Ohm
Lead Channel Sensing Intrinsic Amplitude: 2.6 mV
Lead Channel Sensing Intrinsic Amplitude: 6.5 mV
Lead Channel Setting Pacing Amplitude: 2 V
Lead Channel Setting Pacing Amplitude: 2.5 V
Lead Channel Setting Pacing Pulse Width: 0.5 ms
Lead Channel Setting Sensing Sensitivity: 2 mV
Pulse Gen Model: 2272
Pulse Gen Serial Number: 7929552

## 2019-05-09 ENCOUNTER — Inpatient Hospital Stay: Payer: Medicare HMO

## 2019-05-09 ENCOUNTER — Other Ambulatory Visit: Payer: Self-pay

## 2019-05-09 ENCOUNTER — Encounter: Payer: Self-pay | Admitting: Hematology and Oncology

## 2019-05-09 VITALS — BP 136/69 | HR 94 | Temp 98.6°F | Resp 16

## 2019-05-09 DIAGNOSIS — C9000 Multiple myeloma not having achieved remission: Secondary | ICD-10-CM | POA: Diagnosis not present

## 2019-05-09 DIAGNOSIS — K5909 Other constipation: Secondary | ICD-10-CM | POA: Diagnosis not present

## 2019-05-09 DIAGNOSIS — Z79899 Other long term (current) drug therapy: Secondary | ICD-10-CM | POA: Diagnosis not present

## 2019-05-09 DIAGNOSIS — G893 Neoplasm related pain (acute) (chronic): Secondary | ICD-10-CM | POA: Diagnosis not present

## 2019-05-09 DIAGNOSIS — D61818 Other pancytopenia: Secondary | ICD-10-CM

## 2019-05-09 DIAGNOSIS — N186 End stage renal disease: Secondary | ICD-10-CM | POA: Diagnosis not present

## 2019-05-09 DIAGNOSIS — Z5111 Encounter for antineoplastic chemotherapy: Secondary | ICD-10-CM | POA: Diagnosis not present

## 2019-05-09 DIAGNOSIS — Z992 Dependence on renal dialysis: Secondary | ICD-10-CM | POA: Diagnosis not present

## 2019-05-09 DIAGNOSIS — D631 Anemia in chronic kidney disease: Secondary | ICD-10-CM | POA: Diagnosis not present

## 2019-05-09 LAB — COMPREHENSIVE METABOLIC PANEL
ALT: 7 U/L (ref 0–44)
AST: 18 U/L (ref 15–41)
Albumin: 1.6 g/dL — ABNORMAL LOW (ref 3.5–5.0)
Alkaline Phosphatase: 40 U/L (ref 38–126)
Anion gap: 10 (ref 5–15)
BUN: 4 mg/dL — ABNORMAL LOW (ref 8–23)
CO2: 30 mmol/L (ref 22–32)
Calcium: 7.5 mg/dL — ABNORMAL LOW (ref 8.9–10.3)
Chloride: 95 mmol/L — ABNORMAL LOW (ref 98–111)
Creatinine, Ser: 2.54 mg/dL — ABNORMAL HIGH (ref 0.44–1.00)
GFR calc Af Amer: 19 mL/min — ABNORMAL LOW (ref >60)
GFR calc non Af Amer: 17 mL/min — ABNORMAL LOW (ref >60)
Glucose, Bld: 97 mg/dL (ref 70–99)
Potassium: 3.4 mmol/L — ABNORMAL LOW (ref 3.5–5.1)
Sodium: 135 mmol/L (ref 135–145)
Total Bilirubin: 0.4 mg/dL (ref 0.3–1.2)
Total Protein: 9.5 g/dL — ABNORMAL HIGH (ref 6.5–8.1)

## 2019-05-09 LAB — CBC WITH DIFFERENTIAL/PLATELET
Abs Immature Granulocytes: 0.01 10*3/uL (ref 0.00–0.07)
Basophils Absolute: 0 10*3/uL (ref 0.0–0.1)
Basophils Relative: 0 %
Eosinophils Absolute: 0.1 10*3/uL (ref 0.0–0.5)
Eosinophils Relative: 2 %
HCT: 23 % — ABNORMAL LOW (ref 36.0–46.0)
Hemoglobin: 7.4 g/dL — ABNORMAL LOW (ref 12.0–15.0)
Immature Granulocytes: 0 %
Lymphocytes Relative: 37 %
Lymphs Abs: 1.7 10*3/uL (ref 0.7–4.0)
MCH: 29.2 pg (ref 26.0–34.0)
MCHC: 32.2 g/dL (ref 30.0–36.0)
MCV: 90.9 fL (ref 80.0–100.0)
Monocytes Absolute: 0.4 10*3/uL (ref 0.1–1.0)
Monocytes Relative: 9 %
Neutro Abs: 2.3 10*3/uL (ref 1.7–7.7)
Neutrophils Relative %: 52 %
Platelets: 160 10*3/uL (ref 150–400)
RBC: 2.53 MIL/uL — ABNORMAL LOW (ref 3.87–5.11)
RDW: 20.3 % — ABNORMAL HIGH (ref 11.5–15.5)
WBC: 4.5 10*3/uL (ref 4.0–10.5)
nRBC: 0 % (ref 0.0–0.2)

## 2019-05-09 LAB — SAMPLE TO BLOOD BANK

## 2019-05-09 MED ORDER — PROCHLORPERAZINE MALEATE 10 MG PO TABS
ORAL_TABLET | ORAL | Status: AC
  Filled 2019-05-09: qty 1

## 2019-05-09 MED ORDER — PROCHLORPERAZINE MALEATE 10 MG PO TABS
10.0000 mg | ORAL_TABLET | Freq: Once | ORAL | Status: AC
  Administered 2019-05-09: 10 mg via ORAL

## 2019-05-09 MED ORDER — BORTEZOMIB CHEMO SQ INJECTION 3.5 MG (2.5MG/ML)
1.0400 mg/m2 | Freq: Once | INTRAMUSCULAR | Status: AC
  Administered 2019-05-09: 2 mg via SUBCUTANEOUS
  Filled 2019-05-09: qty 0.8

## 2019-05-09 NOTE — Patient Instructions (Signed)
[place] [facility] Discharge Instructions for Patients Receiving Chemotherapy  Today you received the following chemotherapy agents:  Velcade  To help prevent nausea and vomiting after your treatment, we encourage you to take your nausea medication as prescribed.   If you develop nausea and vomiting that is not controlled by your nausea medication, call the clinic.   BELOW ARE SYMPTOMS THAT SHOULD BE REPORTED IMMEDIATELY:  *FEVER GREATER THAN 100.5 F  *CHILLS WITH OR WITHOUT FEVER  NAUSEA AND VOMITING THAT IS NOT CONTROLLED WITH YOUR NAUSEA MEDICATION  *UNUSUAL SHORTNESS OF BREATH  *UNUSUAL BRUISING OR BLEEDING  TENDERNESS IN MOUTH AND THROAT WITH OR WITHOUT PRESENCE OF ULCERS  *URINARY PROBLEMS  *BOWEL PROBLEMS  UNUSUAL RASH Items with * indicate a potential emergency and should be followed up as soon as possible.  Feel free to call the clinic should you have any questions or concerns. The clinic phone number is [phone].  Please show the [place] at check-in to the Emergency Department and triage nurse.   Coronavirus (COVID-19) Are you at risk?  Are you at risk for the Coronavirus (COVID-19)?  To be considered HIGH RISK for Coronavirus (COVID-19), you have to meet the following criteria:  . Traveled to [place], [place], [place], [place] or [place]; or in the [place] to [place], [place], [place], or [place]; and have fever, cough, and shortness of breath within the last 2 weeks of travel OR . Been in close contact with a person diagnosed with COVID-19 within the last 2 weeks and have fever, cough, and shortness of breath . IF YOU DO NOT MEET THESE CRITERIA, YOU ARE CONSIDERED LOW RISK FOR COVID-19.  What to do if you are HIGH RISK for COVID-19?  Marland Kitchen If you are having a medical emergency, call 911. . Seek medical care right away. Before you go to a doctor's office, urgent care or emergency department, call ahead and tell them about your  recent travel, contact with someone diagnosed with COVID-19, and your symptoms. You should receive instructions from your physician's office regarding next steps of care.  . When you arrive at healthcare provider, tell the healthcare staff immediately you have returned from visiting [place], [place], [place], [place] or [place]; or traveled in the [place] to [place], [place], [place], or [place]; in the last two weeks or you have been in close contact with a person diagnosed with COVID-19 in the last 2 weeks.   . Tell the health care staff about your symptoms: fever, cough and shortness of breath. . After you have been seen by a medical provider, you will be either: o Tested for (COVID-19) and discharged home on quarantine except to seek medical care if symptoms worsen, and asked to  - Stay home and avoid contact with others until you get your results (4-5 days)  - Avoid travel on public transportation if possible (such as bus, train, or airplane) or o Sent to the Emergency Department by EMS for evaluation, COVID-19 testing, and possible admission depending on your condition and test results.  What to do if you are LOW RISK for COVID-19?  Reduce your risk of any infection by using the same precautions used for avoiding the common cold or flu:  Marland Kitchen Wash your hands often with soap and warm water for at least 20 seconds.  If soap and water are not readily available, use an alcohol-based hand sanitizer with at least 60% alcohol.  . If coughing  or sneezing, cover your mouth and nose by coughing or sneezing into the elbow areas of your shirt or coat, into a tissue or into your sleeve (not your hands). . Avoid shaking hands with others and consider head nods or verbal greetings only. . Avoid touching your eyes, nose, or mouth with unwashed hands.  . Avoid close contact with people who are sick. . Avoid places or events with large numbers of people in one location, like concerts or sporting  events. . Carefully consider travel plans you have or are making. . If you are planning any travel outside or inside the [place], visit the CDC's Travelers' Health webpage for the latest health notices. . If you have some symptoms but not all symptoms, continue to monitor at home and seek medical attention if your symptoms worsen. . If you are having a medical emergency, call 911.   ADDITIONAL HEALTHCARE OPTIONS FOR PATIENTS   Telehealth / e-Visit: [email or web address]         [facility]: [phone]  [facility]: [phone]                   [facility]: [phone]     

## 2019-05-09 NOTE — Progress Notes (Signed)
Okay to treat with hemoglobin of 7.4, per Dr. Alvy Bimler.

## 2019-05-10 DIAGNOSIS — Z23 Encounter for immunization: Secondary | ICD-10-CM | POA: Diagnosis not present

## 2019-05-10 DIAGNOSIS — D631 Anemia in chronic kidney disease: Secondary | ICD-10-CM | POA: Diagnosis not present

## 2019-05-10 DIAGNOSIS — N186 End stage renal disease: Secondary | ICD-10-CM | POA: Diagnosis not present

## 2019-05-10 DIAGNOSIS — D689 Coagulation defect, unspecified: Secondary | ICD-10-CM | POA: Diagnosis not present

## 2019-05-10 DIAGNOSIS — N2581 Secondary hyperparathyroidism of renal origin: Secondary | ICD-10-CM | POA: Diagnosis not present

## 2019-05-10 DIAGNOSIS — E0822 Diabetes mellitus due to underlying condition with diabetic chronic kidney disease: Secondary | ICD-10-CM | POA: Diagnosis not present

## 2019-05-16 ENCOUNTER — Other Ambulatory Visit: Payer: Medicare HMO

## 2019-05-16 ENCOUNTER — Inpatient Hospital Stay (HOSPITAL_BASED_OUTPATIENT_CLINIC_OR_DEPARTMENT_OTHER): Payer: Medicare HMO | Admitting: Hematology and Oncology

## 2019-05-16 ENCOUNTER — Other Ambulatory Visit: Payer: Self-pay

## 2019-05-16 ENCOUNTER — Inpatient Hospital Stay: Payer: Medicare HMO

## 2019-05-16 ENCOUNTER — Telehealth: Payer: Self-pay | Admitting: Hematology and Oncology

## 2019-05-16 ENCOUNTER — Ambulatory Visit: Payer: Medicare HMO

## 2019-05-16 ENCOUNTER — Encounter: Payer: Self-pay | Admitting: Hematology and Oncology

## 2019-05-16 ENCOUNTER — Ambulatory Visit: Payer: Medicare HMO | Admitting: Hematology and Oncology

## 2019-05-16 DIAGNOSIS — D631 Anemia in chronic kidney disease: Secondary | ICD-10-CM | POA: Diagnosis not present

## 2019-05-16 DIAGNOSIS — K5909 Other constipation: Secondary | ICD-10-CM | POA: Diagnosis not present

## 2019-05-16 DIAGNOSIS — Z79899 Other long term (current) drug therapy: Secondary | ICD-10-CM | POA: Diagnosis not present

## 2019-05-16 DIAGNOSIS — C9000 Multiple myeloma not having achieved remission: Secondary | ICD-10-CM

## 2019-05-16 DIAGNOSIS — G893 Neoplasm related pain (acute) (chronic): Secondary | ICD-10-CM | POA: Diagnosis not present

## 2019-05-16 DIAGNOSIS — Z5111 Encounter for antineoplastic chemotherapy: Secondary | ICD-10-CM | POA: Diagnosis not present

## 2019-05-16 DIAGNOSIS — D61818 Other pancytopenia: Secondary | ICD-10-CM

## 2019-05-16 DIAGNOSIS — Z992 Dependence on renal dialysis: Secondary | ICD-10-CM

## 2019-05-16 DIAGNOSIS — N186 End stage renal disease: Secondary | ICD-10-CM

## 2019-05-16 DIAGNOSIS — E1122 Type 2 diabetes mellitus with diabetic chronic kidney disease: Secondary | ICD-10-CM | POA: Diagnosis not present

## 2019-05-16 LAB — CBC WITH DIFFERENTIAL/PLATELET
Abs Immature Granulocytes: 0.01 10*3/uL (ref 0.00–0.07)
Basophils Absolute: 0 10*3/uL (ref 0.0–0.1)
Basophils Relative: 0 %
Eosinophils Absolute: 0.1 10*3/uL (ref 0.0–0.5)
Eosinophils Relative: 1 %
HCT: 23.4 % — ABNORMAL LOW (ref 36.0–46.0)
Hemoglobin: 7.5 g/dL — ABNORMAL LOW (ref 12.0–15.0)
Immature Granulocytes: 0 %
Lymphocytes Relative: 42 %
Lymphs Abs: 1.8 10*3/uL (ref 0.7–4.0)
MCH: 29.6 pg (ref 26.0–34.0)
MCHC: 32.1 g/dL (ref 30.0–36.0)
MCV: 92.5 fL (ref 80.0–100.0)
Monocytes Absolute: 0.4 10*3/uL (ref 0.1–1.0)
Monocytes Relative: 9 %
Neutro Abs: 2 10*3/uL (ref 1.7–7.7)
Neutrophils Relative %: 48 %
Platelets: 155 10*3/uL (ref 150–400)
RBC: 2.53 MIL/uL — ABNORMAL LOW (ref 3.87–5.11)
RDW: 21.8 % — ABNORMAL HIGH (ref 11.5–15.5)
WBC: 4.2 10*3/uL (ref 4.0–10.5)
nRBC: 0 % (ref 0.0–0.2)

## 2019-05-16 LAB — COMPREHENSIVE METABOLIC PANEL
ALT: 11 U/L (ref 0–44)
AST: 17 U/L (ref 15–41)
Albumin: 1.7 g/dL — ABNORMAL LOW (ref 3.5–5.0)
Alkaline Phosphatase: 43 U/L (ref 38–126)
Anion gap: 8 (ref 5–15)
BUN: 4 mg/dL — ABNORMAL LOW (ref 8–23)
CO2: 28 mmol/L (ref 22–32)
Calcium: 7.8 mg/dL — ABNORMAL LOW (ref 8.9–10.3)
Chloride: 98 mmol/L (ref 98–111)
Creatinine, Ser: 2.6 mg/dL — ABNORMAL HIGH (ref 0.44–1.00)
GFR calc Af Amer: 19 mL/min — ABNORMAL LOW (ref >60)
GFR calc non Af Amer: 16 mL/min — ABNORMAL LOW (ref >60)
Glucose, Bld: 97 mg/dL (ref 70–99)
Potassium: 3.6 mmol/L (ref 3.5–5.1)
Sodium: 134 mmol/L — ABNORMAL LOW (ref 135–145)
Total Bilirubin: 0.4 mg/dL (ref 0.3–1.2)
Total Protein: 9.8 g/dL — ABNORMAL HIGH (ref 6.5–8.1)

## 2019-05-16 LAB — SAMPLE TO BLOOD BANK

## 2019-05-16 MED ORDER — PROCHLORPERAZINE MALEATE 10 MG PO TABS
10.0000 mg | ORAL_TABLET | Freq: Once | ORAL | Status: AC
  Administered 2019-05-16: 10 mg via ORAL

## 2019-05-16 MED ORDER — PROCHLORPERAZINE MALEATE 10 MG PO TABS
ORAL_TABLET | ORAL | Status: AC
  Filled 2019-05-16: qty 1

## 2019-05-16 MED ORDER — BORTEZOMIB CHEMO SQ INJECTION 3.5 MG (2.5MG/ML)
1.0400 mg/m2 | Freq: Once | INTRAMUSCULAR | Status: AC
  Administered 2019-05-16: 2 mg via SUBCUTANEOUS
  Filled 2019-05-16: qty 0.8

## 2019-05-16 NOTE — Assessment & Plan Note (Signed)
She will continue hemodialysis on Tuesdays, Thursdays and Saturdays

## 2019-05-16 NOTE — Assessment & Plan Note (Signed)
I have reviewed her recent myeloma panel with the patient The light chains continue to fluctuate, likely due to her kidney dialysis M protein is improving We will continue weekly chemotherapy and twice a week dexamethasone She will continue acyclovir for antimicrobial prophylaxis along with calcium and vitamin D We will continue weekly treatment and I will see her again next month for further follow-up

## 2019-05-16 NOTE — Progress Notes (Signed)
[facility] OFFICE PROGRESS NOTE  Patient Care Team: Lucianne Lei, MD as PCP - General (Family Medicine)  ASSESSMENT & PLAN:  Multiple myeloma not having achieved remission [facility]) I have reviewed her recent myeloma panel with the patient The light chains continue to fluctuate, likely due to her kidney dialysis M protein is improving We will continue weekly chemotherapy and twice a week dexamethasone She will continue acyclovir for antimicrobial prophylaxis along with calcium and vitamin D We will continue weekly treatment and I will see her again next month for further follow-up  ESRD (end stage renal disease) on dialysis [facility]) She will continue hemodialysis on Tuesdays, Thursdays and Saturdays  Other constipation She continues to have severe constipation  I recommend trial of suppository at home.  Pancytopenia, acquired [facility]) 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 I will defer to her nephrologist for further management   No orders of the defined types were placed in this encounter.   INTERVAL HISTORY: Please see below for problem oriented charting. She returns for further follow-up She denies neuropathy from chemo No recent infection, fever or chills She continues to struggle with chronic constipation but admits she is taking laxatives She denies recent bleeding Her dialysis is going on well She denies chest pain or shortness of breath  SUMMARY OF ONCOLOGIC HISTORY: Oncology History  Multiple myeloma not having achieved remission ([place])  03/03/2019 Initial Diagnosis   Multiple myeloma not having achieved remission ([place])   03/03/2019 Imaging   1. Round and oval lucent/lytic lesions in the skull, bilateral humeri, distal left femur, and possibly also in the pelvis are compatible with multiple myeloma. No right lower extremity lytic lesion identified. No pathologic fracture. 2. Advanced degenerative changes in the spine with no vertebral myeloma  evident by plain radiography. 3. Advanced degenerative changes in the right upper extremity. Bilateral knee arthroplasty. 4.  Aortic Atherosclerosis (ICD10-I70.0).   03/04/2019 -  Chemotherapy   The patient had bortezomib for chemotherapy treatment.    03/04/2019 Bone Marrow Biopsy   Bone Marrow, Aspirate,Biopsy, and Clot - HYPERCELLULAR BONE MARROW FOR AGE WITH PLASMACYTOSIS. - SEE COMMENT. PERIPHERAL BLOOD: - NORMOCYTIC-NORMOCHROMIC ANEMIA. Diagnosis Note The bone marrow is hypercellular for age with increased number of atypical plasma cells averaging 30% of all cells in the aspirate associated with interstitial infiltrates and numerous variably sized aggregates in the clot and biopsy sections. 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 Confirmatory in situ hybridization for kappa and lambda as well as CD138 will be performed and the results reported in an addendum. The background shows trilineage hematopoiesis with mild non-specific changes primarily involving the erythroid series. Correlation with cytogenetic and FISH studies is recommended.      REVIEW OF SYSTEMS:   Constitutional: Denies fevers, chills or abnormal weight loss Eyes: Denies blurriness of vision Ears, nose, mouth, throat, and face: Denies mucositis or sore throat Respiratory: Denies cough, dyspnea or wheezes Cardiovascular: Denies palpitation, chest discomfort or lower extremity swelling Gastrointestinal:  Denies nausea, heartburn or change in bowel habits Skin: Denies abnormal skin rashes Lymphatics: Denies new lymphadenopathy or easy bruising Neurological:Denies numbness, tingling or new weaknesses Behavioral/Psych: Mood is stable, no new changes  All other systems were reviewed with the patient and are negative.  I have reviewed the past medical history, past surgical history, social history and family history with the patient and they are unchanged from previous note.  ALLERGIES:  is allergic to  aspirin; penicillins; brimonidine; diltiazem hcl; hydrocodone-acetaminophen; morphine; and  neurontin [gabapentin].  MEDICATIONS:  Current Outpatient Medications  Medication Sig Dispense Refill  . acyclovir (ZOVIRAX) 400 MG tablet Take 1 tablet (400 mg total) by mouth daily. 30 tablet 6  . allopurinol (ZYLOPRIM) 100 MG tablet Take 0.5 tablets (50 mg total) by mouth daily. 30 tablet 0  . Coenzyme Q10 (CO Q 10 PO) Take 1 tablet by mouth daily.     Marland Kitchen dexamethasone (DECADRON) 4 MG tablet TAKE 1 TABLET BY MOUTH EVERY DAY 30 tablet 0  . dorzolamide (TRUSOPT) 2 % ophthalmic solution Place 1 drop into the left eye every evening.   1  . fluorometholone (FML) 0.1 % ophthalmic suspension Place 1 drop into the right eye 3 (three) times daily.     Marland Kitchen glimepiride (AMARYL) 1 MG tablet Take 0.5 mg by mouth 2 (two) times daily.     Marland Kitchen HYDROcodone-acetaminophen (NORCO/VICODIN) 5-325 MG tablet Take 1 tablet by mouth every 6 (six) hours as needed for moderate pain. 30 tablet 0  . isosorbide-hydrALAZINE (BIDIL) 20-37.5 MG tablet Take 1 tablet by mouth 2 (two) times a day. 60 tablet 0  . lactulose (CHRONULAC) 10 GM/15ML solution TAKE 15 MLS (10 G TOTAL) BY MOUTH 2 (TWO) TIMES DAILY. 473 mL 0  . LUMIGAN 0.01 % SOLN Place 1 drop into the left eye every evening.     Marland Kitchen LYRICA 50 MG capsule Take 50 mg by mouth daily.     . Multiple Vitamin (MULTIVITAMIN WITH MINERALS) TABS tablet Take 1 tablet by mouth daily.    Marland Kitchen nystatin (MYCOSTATIN/NYSTOP) powder Apply topically 4 (four) times daily. 15 g 0  . ondansetron (ZOFRAN) 4 MG tablet Take 1 tablet (4 mg total) by mouth every 8 (eight) hours as needed for nausea. 30 tablet 1  . pantoprazole (PROTONIX) 40 MG tablet Take 40 mg by mouth daily.    . polyethylene glycol (MIRALAX / GLYCOLAX) 17 g packet Take 5.6 g by mouth at bedtime.    . prochlorperazine (COMPAZINE) 5 MG tablet Take 1 tablet (5 mg total) by mouth every 6 (six) hours as needed for nausea or vomiting. 30 tablet 1  .  senna-docusate (SENOKOT-S) 8.6-50 MG tablet Take 2 tablets by mouth 2 (two) times daily. 30 tablet 0  . sevelamer carbonate (RENVELA) 2.4 g PACK Take 2.4 g by mouth 3 (three) times daily with meals. 90 each 0  . sodium chloride (OCEAN) 0.65 % SOLN nasal spray Place 1 spray into both nostrils as needed for congestion.     No current facility-administered medications for this visit.     PHYSICAL EXAMINATION: ECOG PERFORMANCE STATUS: 2 - Symptomatic, <50% confined to bed  Vitals:   05/16/19 1143  BP: (!) 130/42  Pulse: 75  Resp: 18  Temp: 98.2 F (36.8 C)  SpO2: 100%   Filed Weights   05/16/19 1143  Weight: 157 lb 3.2 oz (71.3 kg)    GENERAL:alert, no distress and comfortable SKIN: skin color, texture, turgor are normal, no rashes or significant lesions EYES: normal, Conjunctiva are pink and non-injected, sclera clear OROPHARYNX:no exudate, no erythema and lips, buccal mucosa, and tongue normal  NECK: supple, thyroid normal size, non-tender, without nodularity LYMPH:  no palpable lymphadenopathy in the cervical, axillary or inguinal LUNGS: clear to auscultation and percussion with normal breathing effort HEART: regular rate & rhythm and no murmurs and no lower extremity edema ABDOMEN:abdomen soft, non-tender and normal bowel sounds Musculoskeletal:no cyanosis of digits and no clubbing  NEURO: alert & oriented x 3 with fluent  speech, no focal motor/sensory deficits  LABORATORY DATA:  I have reviewed the data as listed    Component Value Date/Time   NA 134 (L) 05/16/2019 1125   K 3.6 05/16/2019 1125   CL 98 05/16/2019 1125   CO2 28 05/16/2019 1125   GLUCOSE 97 05/16/2019 1125   BUN 4 (L) 05/16/2019 1125   CREATININE 2.60 (H) 05/16/2019 1125   CREATININE 1.03 (H) 05/11/2016 0931   CALCIUM 7.8 (L) 05/16/2019 1125   PROT 9.8 (H) 05/16/2019 1125   ALBUMIN 1.7 (L) 05/16/2019 1125   AST 17 05/16/2019 1125   ALT 11 05/16/2019 1125   ALKPHOS 43 05/16/2019 1125   BILITOT 0.4  05/16/2019 1125   GFRNONAA 16 (L) 05/16/2019 1125   GFRAA 19 (L) 05/16/2019 1125    No results found for: SPEP, UPEP  Lab Results  Component Value Date   WBC 4.2 05/16/2019   NEUTROABS 2.0 05/16/2019   HGB 7.5 (L) 05/16/2019   HCT 23.4 (L) 05/16/2019   MCV 92.5 05/16/2019   PLT 155 05/16/2019      Chemistry      Component Value Date/Time   NA 134 (L) 05/16/2019 1125   K 3.6 05/16/2019 1125   CL 98 05/16/2019 1125   CO2 28 05/16/2019 1125   BUN 4 (L) 05/16/2019 1125   CREATININE 2.60 (H) 05/16/2019 1125   CREATININE 1.03 (H) 05/11/2016 0931      Component Value Date/Time   CALCIUM 7.8 (L) 05/16/2019 1125   ALKPHOS 43 05/16/2019 1125   AST 17 05/16/2019 1125   ALT 11 05/16/2019 1125   BILITOT 0.4 05/16/2019 1125       RADIOGRAPHIC STUDIES: I have personally reviewed the radiological images as listed and agreed with the findings in the report. Vas US Duplex Dialysis Access (avf, Avg)  Result Date: 04/23/2019 DIALYSIS ACCESS Reason for Exam: Routine follow up. Access Site: Right Upper Extremity. Access Type: Radial-cephalic AVF. History: 03/11/2019: Right radial cephalic AV fistula with subsequent          Superficialization of right radiocephalic AV fistula. Performing Technologist: Burley Saver RVT Supporting Technologist: Ralene Cork RVT  Examination Guidelines: A complete evaluation includes B-mode imaging, spectral Doppler, color Doppler, and power Doppler as needed of all accessible portions of each vessel. Unilateral testing is considered an integral part of a complete examination. Limited examinations for reoccurring indications may be performed as noted.  Findings: +--------------------+----------+-----------------+--------+ AVF                 PSV (cm/s)Flow Vol (mL/min)Comments +--------------------+----------+-----------------+--------+ Native artery inflow   264          1103                +--------------------+----------+-----------------+--------+ AVF  Anastomosis        564                              +--------------------+----------+-----------------+--------+  +------------+----------+-------------+----------+------------------+ OUTFLOW VEINPSV (cm/s)Diameter (cm)Depth (cm)     Describe      +------------+----------+-------------+----------+------------------+ AC Fossa        63        0.73        0.45                      +------------+----------+-------------+----------+------------------+ Prox Forearm   169        0.65        0.36  Retained valve   +------------+----------+-------------+----------+------------------+ Mid Forearm    357        0.51        0.43                      +------------+----------+-------------+----------+------------------+ Dist Forearm   584        0.32        0.86   change in Diameter +------------+----------+-------------+----------+------------------+ Hematoma measuring 1.41 cm x 0.65 cm noted proximal to the anastomosis.   Summary: Arteriovenous fistula-Velocities less than 100cm/s noted in the antecubital fossa.  Patent right radiocephalic fistula with no evidence of hemodynamically significant stenosis. Small hematoma noted measuring 1.41 cm x 0.65 cm just proximal to the anastomosis. Valve leaflets noted in the proximal forearm without elevated velocities.  *See table(s) above for measurements and observations.  Diagnosing physician: [name] MD Electronically signed by [name] MD on 04/23/2019 at 11:16:15 AM.    --------------------------------------------------------------------------------   Final     All questions were answered. The patient knows to call the clinic with any problems, questions or concerns. No barriers to learning was detected.  I spent 15 minutes counseling the patient face to face. The total time spent in the appointment was 20 minutes and more than 50% was on counseling and review of test results  Heath Lark, MD 05/16/2019 3:08 PM

## 2019-05-16 NOTE — Progress Notes (Signed)
Patient sees Dr. Lorrene Reid at [facility]- [phone] Dialysis is done at [facility], [phone].   Left message for Dr. Sanda Klein nurse to return call. Dr. Is out of the office into next week. Will attempt to call again later.   Ok to treat with labs from today per Dr. Alvy Bimler.

## 2019-05-16 NOTE — Patient Instructions (Signed)
[facility] Discharge Instructions for Patients Receiving Chemotherapy  Today you received the following chemotherapy agent :  Velcade.  To help prevent nausea and vomiting after your treatment, we encourage you to take your nausea medication as prescribed.   If you develop nausea and vomiting that is not controlled by your nausea medication, call the clinic.   BELOW ARE SYMPTOMS THAT SHOULD BE REPORTED IMMEDIATELY:  *FEVER GREATER THAN 100.5 F  *CHILLS WITH OR WITHOUT FEVER  NAUSEA AND VOMITING THAT IS NOT CONTROLLED WITH YOUR NAUSEA MEDICATION  *UNUSUAL SHORTNESS OF BREATH  *UNUSUAL BRUISING OR BLEEDING  TENDERNESS IN MOUTH AND THROAT WITH OR WITHOUT PRESENCE OF ULCERS  *URINARY PROBLEMS  *BOWEL PROBLEMS  UNUSUAL RASH Items with * indicate a potential emergency and should be followed up as soon as possible.  Feel free to call the clinic should you have any questions or concerns. The clinic phone number is [phone].  Please show the CHEMO ALERT CARD at check-in to the Emergency Department and triage nurse.   

## 2019-05-16 NOTE — Telephone Encounter (Signed)
I could not reach patient regarding schedule  °

## 2019-05-16 NOTE — Assessment & Plan Note (Signed)
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 I will defer to her nephrologist for further management

## 2019-05-16 NOTE — Assessment & Plan Note (Signed)
She continues to have severe constipation  I recommend trial of suppository at home.

## 2019-05-17 DIAGNOSIS — D689 Coagulation defect, unspecified: Secondary | ICD-10-CM | POA: Diagnosis not present

## 2019-05-17 DIAGNOSIS — N2581 Secondary hyperparathyroidism of renal origin: Secondary | ICD-10-CM | POA: Diagnosis not present

## 2019-05-17 DIAGNOSIS — N186 End stage renal disease: Secondary | ICD-10-CM | POA: Diagnosis not present

## 2019-05-17 DIAGNOSIS — Z992 Dependence on renal dialysis: Secondary | ICD-10-CM | POA: Diagnosis not present

## 2019-05-17 DIAGNOSIS — Z23 Encounter for immunization: Secondary | ICD-10-CM | POA: Diagnosis not present

## 2019-05-17 DIAGNOSIS — E876 Hypokalemia: Secondary | ICD-10-CM | POA: Diagnosis not present

## 2019-05-17 DIAGNOSIS — E0822 Diabetes mellitus due to underlying condition with diabetic chronic kidney disease: Secondary | ICD-10-CM | POA: Diagnosis not present

## 2019-05-17 DIAGNOSIS — D631 Anemia in chronic kidney disease: Secondary | ICD-10-CM | POA: Diagnosis not present

## 2019-05-18 ENCOUNTER — Other Ambulatory Visit: Payer: Self-pay | Admitting: Hematology and Oncology

## 2019-05-19 DIAGNOSIS — Z822 Family history of deafness and hearing loss: Secondary | ICD-10-CM | POA: Diagnosis not present

## 2019-05-19 DIAGNOSIS — H9313 Tinnitus, bilateral: Secondary | ICD-10-CM | POA: Diagnosis not present

## 2019-05-19 DIAGNOSIS — H6123 Impacted cerumen, bilateral: Secondary | ICD-10-CM | POA: Diagnosis not present

## 2019-05-20 NOTE — Progress Notes (Signed)
Remote pacemaker transmission.   

## 2019-05-23 ENCOUNTER — Other Ambulatory Visit: Payer: Self-pay

## 2019-05-23 ENCOUNTER — Inpatient Hospital Stay: Payer: Medicare HMO | Attending: Hematology and Oncology

## 2019-05-23 ENCOUNTER — Inpatient Hospital Stay: Payer: Medicare HMO

## 2019-05-23 VITALS — BP 177/60 | HR 79 | Temp 98.5°F | Resp 18 | Wt 155.5 lb

## 2019-05-23 DIAGNOSIS — N186 End stage renal disease: Secondary | ICD-10-CM | POA: Diagnosis not present

## 2019-05-23 DIAGNOSIS — E1140 Type 2 diabetes mellitus with diabetic neuropathy, unspecified: Secondary | ICD-10-CM | POA: Diagnosis not present

## 2019-05-23 DIAGNOSIS — R54 Age-related physical debility: Secondary | ICD-10-CM | POA: Insufficient documentation

## 2019-05-23 DIAGNOSIS — R634 Abnormal weight loss: Secondary | ICD-10-CM | POA: Insufficient documentation

## 2019-05-23 DIAGNOSIS — R630 Anorexia: Secondary | ICD-10-CM | POA: Insufficient documentation

## 2019-05-23 DIAGNOSIS — Z79899 Other long term (current) drug therapy: Secondary | ICD-10-CM | POA: Diagnosis not present

## 2019-05-23 DIAGNOSIS — E1122 Type 2 diabetes mellitus with diabetic chronic kidney disease: Secondary | ICD-10-CM | POA: Insufficient documentation

## 2019-05-23 DIAGNOSIS — D61818 Other pancytopenia: Secondary | ICD-10-CM | POA: Insufficient documentation

## 2019-05-23 DIAGNOSIS — K5900 Constipation, unspecified: Secondary | ICD-10-CM | POA: Diagnosis not present

## 2019-05-23 DIAGNOSIS — C9000 Multiple myeloma not having achieved remission: Secondary | ICD-10-CM | POA: Insufficient documentation

## 2019-05-23 DIAGNOSIS — D631 Anemia in chronic kidney disease: Secondary | ICD-10-CM | POA: Diagnosis not present

## 2019-05-23 DIAGNOSIS — R140 Abdominal distension (gaseous): Secondary | ICD-10-CM | POA: Diagnosis not present

## 2019-05-23 DIAGNOSIS — Z992 Dependence on renal dialysis: Secondary | ICD-10-CM | POA: Insufficient documentation

## 2019-05-23 DIAGNOSIS — Z5111 Encounter for antineoplastic chemotherapy: Secondary | ICD-10-CM | POA: Diagnosis not present

## 2019-05-23 DIAGNOSIS — J90 Pleural effusion, not elsewhere classified: Secondary | ICD-10-CM | POA: Diagnosis not present

## 2019-05-23 DIAGNOSIS — K5909 Other constipation: Secondary | ICD-10-CM | POA: Diagnosis not present

## 2019-05-23 DIAGNOSIS — K8020 Calculus of gallbladder without cholecystitis without obstruction: Secondary | ICD-10-CM | POA: Diagnosis not present

## 2019-05-23 LAB — COMPREHENSIVE METABOLIC PANEL
ALT: 9 U/L (ref 0–44)
AST: 21 U/L (ref 15–41)
Albumin: 1.7 g/dL — ABNORMAL LOW (ref 3.5–5.0)
Alkaline Phosphatase: 48 U/L (ref 38–126)
Anion gap: 11 (ref 5–15)
BUN: <4 mg/dL — ABNORMAL LOW (ref 8–23)
CO2: 26 mmol/L (ref 22–32)
Calcium: 7.9 mg/dL — ABNORMAL LOW (ref 8.9–10.3)
Chloride: 98 mmol/L (ref 98–111)
Creatinine, Ser: 2.35 mg/dL — ABNORMAL HIGH (ref 0.44–1.00)
GFR calc Af Amer: 21 mL/min — ABNORMAL LOW (ref >60)
GFR calc non Af Amer: 18 mL/min — ABNORMAL LOW (ref >60)
Glucose, Bld: 83 mg/dL (ref 70–99)
Potassium: 3.6 mmol/L (ref 3.5–5.1)
Sodium: 135 mmol/L (ref 135–145)
Total Bilirubin: 0.5 mg/dL (ref 0.3–1.2)
Total Protein: 10.3 g/dL — ABNORMAL HIGH (ref 6.5–8.1)

## 2019-05-23 LAB — CBC WITH DIFFERENTIAL/PLATELET
Abs Immature Granulocytes: 0.01 10*3/uL (ref 0.00–0.07)
Basophils Absolute: 0 10*3/uL (ref 0.0–0.1)
Basophils Relative: 0 %
Eosinophils Absolute: 0 10*3/uL (ref 0.0–0.5)
Eosinophils Relative: 1 %
HCT: 24.6 % — ABNORMAL LOW (ref 36.0–46.0)
Hemoglobin: 7.9 g/dL — ABNORMAL LOW (ref 12.0–15.0)
Immature Granulocytes: 0 %
Lymphocytes Relative: 34 %
Lymphs Abs: 1.3 10*3/uL (ref 0.7–4.0)
MCH: 30 pg (ref 26.0–34.0)
MCHC: 32.1 g/dL (ref 30.0–36.0)
MCV: 93.5 fL (ref 80.0–100.0)
Monocytes Absolute: 0.3 10*3/uL (ref 0.1–1.0)
Monocytes Relative: 8 %
Neutro Abs: 2.2 10*3/uL (ref 1.7–7.7)
Neutrophils Relative %: 57 %
Platelets: 147 10*3/uL — ABNORMAL LOW (ref 150–400)
RBC: 2.63 MIL/uL — ABNORMAL LOW (ref 3.87–5.11)
RDW: 21.6 % — ABNORMAL HIGH (ref 11.5–15.5)
WBC: 3.9 10*3/uL — ABNORMAL LOW (ref 4.0–10.5)
nRBC: 0 % (ref 0.0–0.2)

## 2019-05-23 LAB — SAMPLE TO BLOOD BANK

## 2019-05-23 MED ORDER — BORTEZOMIB CHEMO SQ INJECTION 3.5 MG (2.5MG/ML)
1.0400 mg/m2 | Freq: Once | INTRAMUSCULAR | Status: AC
  Administered 2019-05-23: 2 mg via SUBCUTANEOUS
  Filled 2019-05-23: qty 0.8

## 2019-05-23 MED ORDER — PROCHLORPERAZINE MALEATE 10 MG PO TABS
ORAL_TABLET | ORAL | Status: AC
  Filled 2019-05-23: qty 1

## 2019-05-23 MED ORDER — PROCHLORPERAZINE MALEATE 10 MG PO TABS
10.0000 mg | ORAL_TABLET | Freq: Once | ORAL | Status: AC
  Administered 2019-05-23: 10 mg via ORAL

## 2019-05-23 NOTE — Progress Notes (Signed)
Per Dr. Alvy Bimler, okay to treat with hem 7.9

## 2019-05-23 NOTE — Patient Instructions (Signed)
[facility] Discharge Instructions for Patients Receiving Chemotherapy  Today you received the following chemotherapy agent :  Velcade.  To help prevent nausea and vomiting after your treatment, we encourage you to take your nausea medication as prescribed.   If you develop nausea and vomiting that is not controlled by your nausea medication, call the clinic.   BELOW ARE SYMPTOMS THAT SHOULD BE REPORTED IMMEDIATELY:  *FEVER GREATER THAN 100.5 F  *CHILLS WITH OR WITHOUT FEVER  NAUSEA AND VOMITING THAT IS NOT CONTROLLED WITH YOUR NAUSEA MEDICATION  *UNUSUAL SHORTNESS OF BREATH  *UNUSUAL BRUISING OR BLEEDING  TENDERNESS IN MOUTH AND THROAT WITH OR WITHOUT PRESENCE OF ULCERS  *URINARY PROBLEMS  *BOWEL PROBLEMS  UNUSUAL RASH Items with * indicate a potential emergency and should be followed up as soon as possible.  Feel free to call the clinic should you have any questions or concerns. The clinic phone number is [phone].  Please show the CHEMO ALERT CARD at check-in to the Emergency Department and triage nurse.   

## 2019-05-25 ENCOUNTER — Other Ambulatory Visit: Payer: Self-pay

## 2019-05-25 ENCOUNTER — Emergency Department (HOSPITAL_COMMUNITY): Payer: Medicare HMO

## 2019-05-25 ENCOUNTER — Encounter (HOSPITAL_COMMUNITY): Payer: Self-pay

## 2019-05-25 ENCOUNTER — Observation Stay (HOSPITAL_COMMUNITY)
Admission: EM | Admit: 2019-05-25 | Discharge: 2019-05-26 | Disposition: A | Discharge status: Home / Self Care | Payer: Medicare HMO | Attending: Family Medicine | Admitting: Family Medicine

## 2019-05-25 DIAGNOSIS — H409 Unspecified glaucoma: Secondary | ICD-10-CM | POA: Diagnosis not present

## 2019-05-25 DIAGNOSIS — R109 Unspecified abdominal pain: Secondary | ICD-10-CM | POA: Diagnosis not present

## 2019-05-25 DIAGNOSIS — I495 Sick sinus syndrome: Secondary | ICD-10-CM | POA: Diagnosis not present

## 2019-05-25 DIAGNOSIS — B0230 Zoster ocular disease, unspecified: Secondary | ICD-10-CM | POA: Diagnosis not present

## 2019-05-25 DIAGNOSIS — R112 Nausea with vomiting, unspecified: Secondary | ICD-10-CM | POA: Diagnosis not present

## 2019-05-25 DIAGNOSIS — Z886 Allergy status to analgesic agent status: Secondary | ICD-10-CM | POA: Insufficient documentation

## 2019-05-25 DIAGNOSIS — D61818 Other pancytopenia: Secondary | ICD-10-CM | POA: Diagnosis not present

## 2019-05-25 DIAGNOSIS — Z794 Long term (current) use of insulin: Secondary | ICD-10-CM | POA: Insufficient documentation

## 2019-05-25 DIAGNOSIS — C9000 Multiple myeloma not having achieved remission: Secondary | ICD-10-CM | POA: Insufficient documentation

## 2019-05-25 DIAGNOSIS — Z880 Allergy status to penicillin: Secondary | ICD-10-CM | POA: Insufficient documentation

## 2019-05-25 DIAGNOSIS — Z888 Allergy status to other drugs, medicaments and biological substances status: Secondary | ICD-10-CM | POA: Insufficient documentation

## 2019-05-25 DIAGNOSIS — Z20828 Contact with and (suspected) exposure to other viral communicable diseases: Secondary | ICD-10-CM | POA: Diagnosis not present

## 2019-05-25 DIAGNOSIS — Z8249 Family history of ischemic heart disease and other diseases of the circulatory system: Secondary | ICD-10-CM | POA: Insufficient documentation

## 2019-05-25 DIAGNOSIS — E1139 Type 2 diabetes mellitus with other diabetic ophthalmic complication: Secondary | ICD-10-CM | POA: Insufficient documentation

## 2019-05-25 DIAGNOSIS — I1311 Hypertensive heart and chronic kidney disease without heart failure, with stage 5 chronic kidney disease, or end stage renal disease: Secondary | ICD-10-CM | POA: Insufficient documentation

## 2019-05-25 DIAGNOSIS — L89309 Pressure ulcer of unspecified buttock, unspecified stage: Secondary | ICD-10-CM | POA: Insufficient documentation

## 2019-05-25 DIAGNOSIS — Z03818 Encounter for observation for suspected exposure to other biological agents ruled out: Secondary | ICD-10-CM | POA: Diagnosis not present

## 2019-05-25 DIAGNOSIS — E1122 Type 2 diabetes mellitus with diabetic chronic kidney disease: Secondary | ICD-10-CM | POA: Diagnosis not present

## 2019-05-25 DIAGNOSIS — K8020 Calculus of gallbladder without cholecystitis without obstruction: Secondary | ICD-10-CM | POA: Insufficient documentation

## 2019-05-25 DIAGNOSIS — L89109 Pressure ulcer of unspecified part of back, unspecified stage: Secondary | ICD-10-CM | POA: Diagnosis not present

## 2019-05-25 DIAGNOSIS — E1142 Type 2 diabetes mellitus with diabetic polyneuropathy: Secondary | ICD-10-CM | POA: Diagnosis not present

## 2019-05-25 DIAGNOSIS — N186 End stage renal disease: Secondary | ICD-10-CM | POA: Diagnosis not present

## 2019-05-25 DIAGNOSIS — R0602 Shortness of breath: Secondary | ICD-10-CM | POA: Diagnosis not present

## 2019-05-25 DIAGNOSIS — I10 Essential (primary) hypertension: Secondary | ICD-10-CM | POA: Diagnosis not present

## 2019-05-25 DIAGNOSIS — R1011 Right upper quadrant pain: Secondary | ICD-10-CM | POA: Diagnosis not present

## 2019-05-25 DIAGNOSIS — R140 Abdominal distension (gaseous): Principal | ICD-10-CM | POA: Insufficient documentation

## 2019-05-25 DIAGNOSIS — D631 Anemia in chronic kidney disease: Secondary | ICD-10-CM | POA: Insufficient documentation

## 2019-05-25 DIAGNOSIS — Z885 Allergy status to narcotic agent status: Secondary | ICD-10-CM | POA: Insufficient documentation

## 2019-05-25 DIAGNOSIS — Z992 Dependence on renal dialysis: Secondary | ICD-10-CM | POA: Insufficient documentation

## 2019-05-25 DIAGNOSIS — G4733 Obstructive sleep apnea (adult) (pediatric): Secondary | ICD-10-CM | POA: Insufficient documentation

## 2019-05-25 DIAGNOSIS — H42 Glaucoma in diseases classified elsewhere: Secondary | ICD-10-CM | POA: Insufficient documentation

## 2019-05-25 DIAGNOSIS — R634 Abnormal weight loss: Secondary | ICD-10-CM | POA: Diagnosis not present

## 2019-05-25 DIAGNOSIS — L8990 Pressure ulcer of unspecified site, unspecified stage: Secondary | ICD-10-CM | POA: Insufficient documentation

## 2019-05-25 DIAGNOSIS — K5909 Other constipation: Secondary | ICD-10-CM | POA: Insufficient documentation

## 2019-05-25 DIAGNOSIS — Z7952 Long term (current) use of systemic steroids: Secondary | ICD-10-CM | POA: Insufficient documentation

## 2019-05-25 DIAGNOSIS — R1013 Epigastric pain: Secondary | ICD-10-CM | POA: Diagnosis not present

## 2019-05-25 DIAGNOSIS — Z90710 Acquired absence of both cervix and uterus: Secondary | ICD-10-CM | POA: Insufficient documentation

## 2019-05-25 DIAGNOSIS — R6881 Early satiety: Secondary | ICD-10-CM | POA: Insufficient documentation

## 2019-05-25 DIAGNOSIS — Z79899 Other long term (current) drug therapy: Secondary | ICD-10-CM | POA: Insufficient documentation

## 2019-05-25 LAB — CBC
HCT: 28.9 % — ABNORMAL LOW (ref 36.0–46.0)
HCT: 24.9 % — ABNORMAL LOW (ref 36.0–46.0)
Hemoglobin: 9.1 g/dL — ABNORMAL LOW (ref 12.0–15.0)
Hemoglobin: 8 g/dL — ABNORMAL LOW (ref 12.0–15.0)
MCH: 30 pg (ref 26.0–34.0)
MCH: 30.7 pg (ref 26.0–34.0)
MCHC: 31.5 g/dL (ref 30.0–36.0)
MCHC: 32.1 g/dL (ref 30.0–36.0)
MCV: 95.4 fL (ref 80.0–100.0)
MCV: 95.4 fL (ref 80.0–100.0)
Platelets: 134 10*3/uL — ABNORMAL LOW (ref 150–400)
Platelets: 141 10*3/uL — ABNORMAL LOW (ref 150–400)
RBC: 3.03 MIL/uL — ABNORMAL LOW (ref 3.87–5.11)
RBC: 2.61 MIL/uL — ABNORMAL LOW (ref 3.87–5.11)
RDW: 21.5 % — ABNORMAL HIGH (ref 11.5–15.5)
RDW: 21.6 % — ABNORMAL HIGH (ref 11.5–15.5)
WBC: 3.5 10*3/uL — ABNORMAL LOW (ref 4.0–10.5)
WBC: 3.5 10*3/uL — ABNORMAL LOW (ref 4.0–10.5)
nRBC: 0 % (ref 0.0–0.2)
nRBC: 0 % (ref 0.0–0.2)

## 2019-05-25 LAB — COMPREHENSIVE METABOLIC PANEL
ALT: 14 U/L (ref 0–44)
AST: 20 U/L (ref 15–41)
Albumin: 1.9 g/dL — ABNORMAL LOW (ref 3.5–5.0)
Alkaline Phosphatase: 48 U/L (ref 38–126)
Anion gap: 11 (ref 5–15)
BUN: <5 mg/dL — ABNORMAL LOW (ref 8–23)
CO2: 26 mmol/L (ref 22–32)
Calcium: 8.2 mg/dL — ABNORMAL LOW (ref 8.9–10.3)
Chloride: 97 mmol/L — ABNORMAL LOW (ref 98–111)
Creatinine, Ser: 2.72 mg/dL — ABNORMAL HIGH (ref 0.44–1.00)
GFR calc Af Amer: 18 mL/min — ABNORMAL LOW (ref >60)
GFR calc non Af Amer: 15 mL/min — ABNORMAL LOW (ref >60)
Glucose, Bld: 79 mg/dL (ref 70–99)
Potassium: 3.9 mmol/L (ref 3.5–5.1)
Sodium: 134 mmol/L — ABNORMAL LOW (ref 135–145)
Total Bilirubin: 0.6 mg/dL (ref 0.3–1.2)
Total Protein: 10.7 g/dL — ABNORMAL HIGH (ref 6.5–8.1)

## 2019-05-25 LAB — CREATININE, SERUM
Creatinine, Ser: 3.14 mg/dL — ABNORMAL HIGH (ref 0.44–1.00)
GFR calc Af Amer: 15 mL/min — ABNORMAL LOW (ref >60)
GFR calc non Af Amer: 13 mL/min — ABNORMAL LOW (ref >60)

## 2019-05-25 LAB — KAPPA/LAMBDA LIGHT CHAINS
Kappa free light chain: 6202.2 mg/L — ABNORMAL HIGH (ref 3.3–19.4)
Kappa, lambda light chain ratio: 544.05 — ABNORMAL HIGH (ref 0.26–1.65)
Lambda free light chains: 11.4 mg/L (ref 5.7–26.3)

## 2019-05-25 LAB — LIPASE, BLOOD: Lipase: 23 U/L (ref 11–51)

## 2019-05-25 LAB — MAGNESIUM: Magnesium: 1.7 mg/dL (ref 1.7–2.4)

## 2019-05-25 LAB — GLUCOSE, CAPILLARY: Glucose-Capillary: 64 mg/dL — ABNORMAL LOW (ref 70–99)

## 2019-05-25 LAB — PHOSPHORUS: Phosphorus: 1.5 mg/dL — ABNORMAL LOW (ref 2.5–4.6)

## 2019-05-25 LAB — SARS CORONAVIRUS 2 BY RT PCR (HOSPITAL ORDER, PERFORMED IN ~~LOC~~ HOSPITAL LAB): SARS Coronavirus 2: NEGATIVE

## 2019-05-25 MED ORDER — INSULIN ASPART 100 UNIT/ML ~~LOC~~ SOLN
0.0000 [IU] | Freq: Three times a day (TID) | SUBCUTANEOUS | Status: DC
  Filled 2019-05-25: qty 0.09

## 2019-05-25 MED ORDER — HYDROCODONE-ACETAMINOPHEN 5-325 MG PO TABS: 1.0000 | ORAL_TABLET | Freq: Four times a day (QID) | ORAL | Status: DC | PRN

## 2019-05-25 MED ORDER — SODIUM CHLORIDE 0.9% FLUSH
3.0000 mL | Freq: Once | INTRAVENOUS | Status: AC
  Administered 2019-05-25 (×2): 3 mL via INTRAVENOUS

## 2019-05-25 MED ORDER — HYDROMORPHONE HCL 1 MG/ML IJ SOLN: 0.5000 mg | INTRAMUSCULAR | Status: DC | PRN

## 2019-05-25 MED ORDER — HEPARIN SODIUM (PORCINE) 5000 UNIT/ML IJ SOLN
5000.0000 [IU] | Freq: Three times a day (TID) | INTRAMUSCULAR | Status: DC
  Administered 2019-05-25 – 2019-05-26 (×2): 5000 [IU] via SUBCUTANEOUS
  Filled 2019-05-25 (×2): qty 1

## 2019-05-25 MED ORDER — GLUCOSE 40 % PO GEL
ORAL | Status: AC
  Filled 2019-05-25: qty 1

## 2019-05-25 MED ORDER — DEXAMETHASONE 4 MG PO TABS
4.0000 mg | ORAL_TABLET | Freq: Every day | ORAL | Status: DC
  Administered 2019-05-26: 4 mg via ORAL
  Filled 2019-05-25 (×2): qty 1

## 2019-05-25 MED ORDER — SODIUM CHLORIDE (PF) 0.9 % IJ SOLN
INTRAMUSCULAR | Status: AC
  Administered 2019-05-25: 3 mL via INTRAVENOUS
  Filled 2019-05-25: qty 50

## 2019-05-25 MED ORDER — GLUCOSE 40 % PO GEL
1.0000 | ORAL | Status: AC
  Administered 2019-05-25: 37.5 g via ORAL

## 2019-05-25 MED ORDER — IOHEXOL 300 MG/ML  SOLN
100.0000 mL | Freq: Once | INTRAMUSCULAR | Status: AC | PRN
  Administered 2019-05-25 (×2): 100 mL via INTRAVENOUS

## 2019-05-25 MED ORDER — ISOSORB DINITRATE-HYDRALAZINE 20-37.5 MG PO TABS
1.0000 | ORAL_TABLET | Freq: Three times a day (TID) | ORAL | Status: DC
  Administered 2019-05-25 – 2019-05-26 (×3): 1 via ORAL
  Filled 2019-05-25 (×4): qty 1

## 2019-05-25 MED ORDER — PREGABALIN 50 MG PO CAPS
50.0000 mg | ORAL_CAPSULE | Freq: Once | ORAL | Status: AC
  Administered 2019-05-25: 50 mg via ORAL
  Filled 2019-05-25: qty 1

## 2019-05-25 MED ORDER — PANTOPRAZOLE SODIUM 40 MG IV SOLR
40.0000 mg | Freq: Two times a day (BID) | INTRAVENOUS | Status: DC
  Administered 2019-05-25 – 2019-05-26 (×2): 40 mg via INTRAVENOUS
  Filled 2019-05-25 (×2): qty 40

## 2019-05-25 MED ORDER — INSULIN ASPART 100 UNIT/ML ~~LOC~~ SOLN
0.0000 [IU] | Freq: Every day | SUBCUTANEOUS | Status: DC
  Filled 2019-05-25: qty 0.05

## 2019-05-25 NOTE — Progress Notes (Signed)
Hypoglycemic Event  CBG: 64  Treatment: Pt. Unable to drink much sprite; Will give patient sugar tube.  Symptoms:None  Follow-up CBG: [id number] CBG Result:79  Possible Reasons for Event:poor appetite  Comments/MD notified:N/A; protocol initiated     Roderick Pee

## 2019-05-25 NOTE — ED Triage Notes (Addendum)
Patient c/o bloating in abdomen that started yesterday.   Patient c/o shob X1 day from bloating patient states.   Patient had dialysis yesterday and chemo Friday.    Last BM today before coming to ED.   Denies N/V    A/Ox4 Wheelchair in triage.    100% RA.   Patient states she took antacids with no relief.   Hx. Cancer

## 2019-05-25 NOTE — Psychosocial Assessment (Signed)
Was in patient room. Returning call at this time for report. Roderick Pee

## 2019-05-25 NOTE — ED Notes (Signed)
Hospitalist at bedside 

## 2019-05-25 NOTE — H&P (Signed)
History and Physical  [name] OQH:[id number] DOB: Aug 23, 1930 DOA: 05/25/2019  Referring physician: ER provider PCP: Lucianne Lei, MD  Outpatient Specialists: Nephrology.  Patient is on hemodialysis TTS Patient coming from: Home  Chief Complaint: Abdominal bloating and early satiety.  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  According to the patient, she has been having non-specific abdominal symptoms since diagnosed with multiple myeloma and starting hemodialysis 2 months ago.  Patient has bloating of the abdomen, nonspecific right upper quadrant discomfort, poor appetite, early satiety and weight loss (about 35 pounds).  Patient has had intermittent nausea, but none for now.  Patient could not coordinate confirm definitive abdominal pain.  Imaging was done revealed cholelithiasis without acute cholecystitis.  Patient has been on dexamethasone for multiple myeloma.  No headache, no neck pain, no fever or chills, no URI symptoms, no shortness of breath, no chest pain no urinary symptoms.  The ER provider has asked the hospitalist team to admit patient due to concerns for abdominal pain related to cholelithiasis.  ED Course: On presentation to the ER, patient was afebrile, blood pressure ranged from 172-197/63-85 mmHg, heart rate of 82 bpm, respiratory rate of 19 and O2 sat of 96%.  Pertinent labs revealed WBC of 3.5, hemoglobin of 9.1, hematocrit of 28.9, platelet count of 134, sodium of 134, CO2 of 26, BUN of less than 5 and serum creatinine of 2.72.  Calcium is 8.2.  Lipase was within normal range (23).  AST and ALT, total bilirubin were all normal with normal alkaline phosphatase.  Pertinent labs: Please see above.  Total protein is  10.7 with albumin of 1.9.  Rapid COVID-19 testing came back negative.  CT scan of the abdomen and pelvis with contrast revealed no acute intra-abdominal abnormality.  Specifically, CT abdomen and pelvis with contrast also reported cholelithiasis without secondary signs of acute cholecystitis, L1 vertebral body lytic lesion consistent with multiple myeloma, cardiomegaly with trace bilateral pleural effusions with adjacent atelectasis were also reported.  EKG: Independently reviewed.   Imaging: independently reviewed.   Review of Systems:  Negative for fever, visual changes, sore throat, rash, new muscle aches, chest pain, SOB, dysuria, bleeding.  Past Medical History:  Diagnosis Date   Benign paroxysmal positional vertigo 04/06/2010   Benign positional vertigo    BRADYCARDIA 01/27/2009   s/p PPM   CAROTID BRUIT 02/24/2008   DIABETES MELLITUS, TYPE II 02/24/2008   Glaucoma    HYPERTENSION 02/24/2008   Obstructive sleep apnea    Sick sinus syndrome [facility])     Past Surgical History:  Procedure Laterality Date   ABDOMINAL HYSTERECTOMY     1980's   AV FISTULA PLACEMENT Right 03/11/2019   Procedure: CREATION RIGHT ARM RADIOCEPHALIC ARTERIOVENOUS FISTULA;  Surgeon: Angelia Mould, MD;  Location: [facility];  Service: Vascular;  Laterality: Right;   BACK SURGERY     x 3   EP IMPLANTABLE DEVICE N/A 05/16/2016   Procedure: PPM Generator Changeout;  Surgeon: Thompson Grayer, MD;  Location: [facility];  Service: Cardiovascular;  Laterality: N/A;   FISTULA SUPERFICIALIZATION Right 03/11/2019   Procedure: FISTULA SUPERFICIALIZATION;  Surgeon: Angelia Mould, MD;  Location: [place] [facility];  Service: Vascular;  Laterality: Right;   IR FLUORO GUIDE CV LINE RIGHT  03/07/2019   IR US GUIDE  VASC ACCESS RIGHT  03/07/2019   KNEE SURGERY Right    2003   KNEE SURGERY Left    2011   PACEMAKER INSERTION       reports that she has never smoked. She has never used smokeless tobacco. She  reports that she does not drink alcohol or use drugs.  Allergies  Allergen Reactions   Aspirin Other (See Comments)    REACTION: STOMACH ISSUES WITH DOSE HIGHER THAN 81 MG    Penicillins Rash    Patient took injection and tablets, had a reaction. She has taken amoxicillin with no reaction Has patient had a PCN reaction causing immediate rash, facial/tongue/throat swelling, SOB or lightheadedness with hypotension: Yes Has patient had a PCN reaction causing severe rash involving mucus membranes or skin necrosis: Yes Has patient had a PCN reaction that required hospitalization No Has patient had a PCN reaction occurring within the last 10 years: No If all of the above answers are "NO", then m   Brimonidine Itching   Diltiazem Hcl Rash   Hydrocodone-Acetaminophen Nausea And Vomiting   Morphine Nausea And Vomiting   Neurontin [Gabapentin] Nausea And Vomiting    Family History  Problem Relation Age of Onset   Congestive Heart Failure Mother    Tuberculosis Mother    Multiple myeloma Mother    Bone cancer Father    Arthritis Father    Breast cancer Sister    Colon cancer Sister    Hypertension Sister    Dementia Sister    Alcohol abuse Son      Prior to Admission medications   Medication Sig Start Date End Date Taking? Authorizing Provider  acyclovir (ZOVIRAX) 400 MG tablet Take 1 tablet (400 mg total) by mouth daily. 04/25/19   Heath Lark, MD  allopurinol (ZYLOPRIM) 100 MG tablet Take 0.5 tablets (50 mg total) by mouth daily. 03/12/19   Nita Sells, MD  Coenzyme Q10 (CO Q 10 PO) Take 1 tablet by mouth daily.     [provider]  dexamethasone (DECADRON) 4 MG tablet TAKE 1 TABLET BY MOUTH EVERY DAY 04/17/19   Heath Lark, MD  dorzolamide (TRUSOPT) 2 % ophthalmic solution Place 1 drop into the left eye every evening.  12/21/17   [provider]  fluorometholone (FML) 0.1 % ophthalmic suspension Place 1 drop into the right eye 3 (three) times  daily.  02/01/19   [provider]  glimepiride (AMARYL) 1 MG tablet Take 0.5 mg by mouth 2 (two) times daily.     [provider]  HYDROcodone-acetaminophen (NORCO/VICODIN) 5-325 MG tablet Take 1 tablet by mouth every 6 (six) hours as needed for moderate pain. 03/26/19   Heath Lark, MD  isosorbide-hydrALAZINE (BIDIL) 20-37.5 MG tablet Take 1 tablet by mouth 2 (two) times a day. 03/11/19   Nita Sells, MD  lactulose (CHRONULAC) 10 GM/15ML solution TAKE 15 MLS (10 G TOTAL) BY MOUTH 2 (TWO) TIMES DAILY. 05/19/19   Gorsuch, Ni, MD  LUMIGAN 0.01 % SOLN Place 1 drop into the left eye every evening.  02/01/19   [provider]  LYRICA 50 MG capsule Take 50 mg by mouth daily.  06/14/15   [provider]  Multiple Vitamin (MULTIVITAMIN WITH MINERALS) TABS tablet Take 1 tablet by mouth daily.    [provider]  nystatin (MYCOSTATIN/NYSTOP) powder Apply topically 4 (four) times daily. 04/04/19   Heath Lark, MD  ondansetron (ZOFRAN) 4 MG tablet Take 1 tablet (4 mg total) by mouth every 8 (eight)  hours as needed for nausea. 03/14/19   Heath Lark, MD  pantoprazole (PROTONIX) 40 MG tablet Take 40 mg by mouth daily. 02/20/19   [provider]  polyethylene glycol (MIRALAX / GLYCOLAX) 17 g packet Take 5.6 g by mouth at bedtime.    [provider]  prochlorperazine (COMPAZINE) 5 MG tablet Take 1 tablet (5 mg total) by mouth every 6 (six) hours as needed for nausea or vomiting. 03/14/19   Heath Lark, MD  senna-docusate (SENOKOT-S) 8.6-50 MG tablet Take 2 tablets by mouth 2 (two) times daily. 03/11/19   Nita Sells, MD  sevelamer carbonate (RENVELA) 2.4 g PACK Take 2.4 g by mouth 3 (three) times daily with meals. 03/11/19   Nita Sells, MD  sodium chloride (OCEAN) 0.65 % SOLN nasal spray Place 1 spray into both nostrils as needed for congestion.    [provider]    Physical Exam: Vitals:   05/25/19 1630 05/25/19 1641  05/25/19 1730 05/25/19 1944  BP: (!) 172/63 (!) 172/63 (!) 173/59 (!) 176/63  Pulse: 70 77 73 82  Resp:  18  19  Temp:      TempSrc:      SpO2: 96% 98% 97% 96%    Constitutional:   Appears calm and comfortable.  Eyes:   Mild pallor. No jaundice.  ENMT:   external ears, nose appear normal Neck:   Neck is supple. No JVD Respiratory:   CTA bilaterally, no w/r/r.   Respiratory effort normal. No retractions or accessory muscle use Cardiovascular:   K3T2, systolic murmur with increased intensity of S2 component of the heart sounds  Fullness of the ankle.     Abdomen:   Abdomen is obese, soft with no clear area of tenderness.  Organs are difficult to assess. Neurologic:   Awake and alert.  Moves all limbs.  Wt Readings from Last 3 Encounters:  05/23/19 70.5 kg  05/16/19 71.3 kg  04/25/19 73.7 kg    I have personally reviewed following labs and imaging studies  Labs on Admission:  CBC: Recent Labs  Lab 05/23/19 0811 05/25/19 1440  WBC 3.9* 3.5*  NEUTROABS 2.2  --   HGB 7.9* 9.1*  HCT 24.6* 28.9*  MCV 93.5 95.4  PLT 147* 481*   Basic Metabolic Panel: Recent Labs  Lab 05/23/19 0811 05/25/19 1440  NA 135 134*  K 3.6 3.9  CL 98 97*  CO2 26 26  GLUCOSE 83 79  BUN <4* <5*  CREATININE 2.35* 2.72*  CALCIUM 7.9* 8.2*   Liver Function Tests: Recent Labs  Lab 05/23/19 0811 05/25/19 1440  AST 21 20  ALT 9 14  ALKPHOS 48 48  BILITOT 0.5 0.6  PROT 10.3* 10.7*  ALBUMIN 1.7* 1.9*   Recent Labs  Lab 05/25/19 1440  LIPASE 23   No results for input(s): AMMONIA in the last 168 hours. Coagulation Profile: No results for input(s): INR, PROTIME in the last 168 hours. Cardiac Enzymes: No results for input(s): CKTOTAL, CKMB, CKMBINDEX, TROPONINI in the last 168 hours. BNP (last 3 results) No results for input(s): PROBNP in the last 8760 hours. HbA1C: No results for input(s): HGBA1C in the last 72 hours. CBG: No results for input(s): GLUCAP in the  last 168 hours. Lipid Profile: No results for input(s): CHOL, HDL, LDLCALC, TRIG, CHOLHDL, LDLDIRECT in the last 72 hours. Thyroid Function Tests: No results for input(s): TSH, T4TOTAL, FREET4, T3FREE, THYROIDAB in the last 72 hours. Anemia Panel: No results for input(s): VITAMINB12, FOLATE, FERRITIN, TIBC,  IRON, RETICCTPCT in the last 72 hours. Urine analysis:    Component Value Date/Time   COLORURINE STRAW (A) 02/25/2019 1103   APPEARANCEUR CLEAR 02/25/2019 1103   LABSPEC 1.008 02/25/2019 1103   PHURINE 6.0 02/25/2019 1103   GLUCOSEU NEGATIVE 02/25/2019 1103   HGBUR SMALL (A) 02/25/2019 1103   BILIRUBINUR NEGATIVE 02/25/2019 1103   KETONESUR NEGATIVE 02/25/2019 1103   PROTEINUR 30 (A) 02/25/2019 1103   UROBILINOGEN 1.0 10/05/2012 1953   NITRITE NEGATIVE 02/25/2019 1103   LEUKOCYTESUR TRACE (A) 02/25/2019 1103   Sepsis Labs: @LABRCNTIP (procalcitonin:4,lacticidven:4) ) Recent Results (from the past 240 hour(s))  SARS Coronavirus 2 [facility] order, Performed in [facility] hospital lab) Nasopharyngeal Nasopharyngeal Swab     Status: None   Collection Time: 05/25/19  3:50 PM   Specimen: Nasopharyngeal Swab  Result Value Ref Range Status   SARS Coronavirus 2 NEGATIVE NEGATIVE Final    Comment: (NOTE) If result is NEGATIVE SARS-CoV-2 target nucleic acids are NOT DETECTED. The SARS-CoV-2 RNA is generally detectable in upper and lower  respiratory specimens during the acute phase of infection. The lowest  concentration of SARS-CoV-2 viral copies this assay can detect is 250  copies / mL. A negative result does not preclude SARS-CoV-2 infection  and should not be used as the sole basis for treatment or other  patient management decisions.  A negative result may occur with  improper specimen collection / handling, submission of specimen other  than nasopharyngeal swab, presence of viral mutation(s) within the  areas targeted by this assay, and inadequate number of viral copies    (<250 copies / mL). A negative result must be combined with clinical  observations, patient history, and epidemiological information. If result is POSITIVE SARS-CoV-2 target nucleic acids are DETECTED. The SARS-CoV-2 RNA is generally detectable in upper and lower  respiratory specimens dur ing the acute phase of infection.  Positive  results are indicative of active infection with SARS-CoV-2.  Clinical  correlation with patient history and other diagnostic information is  necessary to determine patient infection status.  Positive results do  not rule out bacterial infection or co-infection with other viruses. If result is PRESUMPTIVE POSTIVE SARS-CoV-2 nucleic acids MAY BE PRESENT.   A presumptive positive result was obtained on the submitted specimen  and confirmed on repeat testing.  While 2019 novel coronavirus  (SARS-CoV-2) nucleic acids may be present in the submitted sample  additional confirmatory testing may be necessary for epidemiological  and / or clinical management purposes  to differentiate between  SARS-CoV-2 and other Sarbecovirus currently known to infect humans.  If clinically indicated additional testing with an alternate test  methodology [phone]) is advised. The SARS-CoV-2 RNA is generally  detectable in upper and lower respiratory sp ecimens during the acute  phase of infection. The expected result is Negative. Fact Sheet for Patients:  StrictlyIdeas.no Fact Sheet for Healthcare Providers: BankingDealers.co.za This test is not yet approved or cleared by the [place] FDA and has been authorized for detection and/or diagnosis of SARS-CoV-2 by FDA under an Emergency Use Authorization (EUA).  This EUA will remain in effect (meaning this test can be used) for the duration of the COVID-19 declaration under Section 564(b)(1) of the Act, 21 U.S.C. section 360bbb-3(b)(1), unless the authorization is terminated  or revoked sooner. Performed at [facility], [place] [address] [place], [place] [address]       Radiological Exams on Admission: Ct Abdomen Pelvis W Contrast  Result Date: 05/25/2019 CLINICAL DATA:  Abdominal distension. EXAM: CT ABDOMEN AND PELVIS WITH CONTRAST TECHNIQUE: Multidetector CT imaging of the abdomen and pelvis was performed using the standard protocol following bolus administration of intravenous contrast. CONTRAST:  <See Chart> OMNIPAQUE IOHEXOL 300 MG/ML  SOLN COMPARISON:  None. FINDINGS: Lower chest: There is a trace right-sided pleural effusion. There is a trace left-sided pleural effusion. There is atelectasis at the lung bases.The heart size is significantly enlarged. Coronary and aortic calcifications are noted. Hepatobiliary: The liver is normal. Cholelithiasis without acute inflammation.The common bile duct is mildly dilated but tapers normally towards the head of the pancreas. Pancreas: Normal contours without ductal dilatation. No peripancreatic fluid collection. Spleen: No splenic laceration or hematoma. Adrenals/Urinary Tract: --Adrenal glands: No adrenal hemorrhage. --Right kidney/ureter: No hydronephrosis or perinephric hematoma. --Left kidney/ureter: No hydronephrosis or perinephric hematoma. --Urinary bladder: Unremarkable. Stomach/Bowel: --Stomach/Duodenum: No hiatal hernia or other gastric abnormality. Normal duodenal course and caliber. --Small bowel: No dilatation or inflammation. --Colon: No focal abnormality. --Appendix: Not visualized. No right lower quadrant inflammation or free fluid. Vascular/Lymphatic: Atherosclerotic calcification is present within the non-aneurysmal abdominal aorta, without hemodynamically significant stenosis. --No retroperitoneal lymphadenopathy. --No mesenteric lymphadenopathy. --No pelvic or inguinal lymphadenopathy. Reproductive: Status post hysterectomy. No adnexal mass. Other: There is a trace amount of free fluid in the  abdomen and pelvis. There is mild body wall edema. Musculoskeletal. There is a lytic lesion in the L1 vertebral body. There are advanced degenerative changes throughout the visualized thoracolumbar spine. There is no displaced fracture. IMPRESSION: 1. No acute intra-abdominal abnormality detected. 2. Small volume free fluid in the abdomen and pelvis. 3. There is cholelithiasis without secondary signs of acute cholecystitis. 4. Heterogeneous appearance of the osseous structures including a lytic lesion in the L1 vertebral body is consistent with the patient's reported history of multiple myeloma. There is no acute displaced fracture. 5. Cardiomegaly with trace bilateral pleural effusions and adjacent atelectasis. Electronically Signed   By: Constance Holster M.D.   On: 05/25/2019 18:37   Dg Chest Port 1 View  Result Date: 05/25/2019 CLINICAL DATA:  Shortness of breath. EXAM: PORTABLE CHEST 1 VIEW COMPARISON:  Mar 01, 2019 FINDINGS: A right I will is catheter is in good position. No pneumothorax. Cardiomegaly. No overt edema. Mild opacity in left base favored represent atelectasis, and perhaps a tiny effusion or pleural thickening, unchanged. IMPRESSION: 1. Cardiomegaly.  Stable pacemaker. 2. The dialysis catheter is in good position.  No pneumothorax. 3. Opacity in the left retrocardiac region is likely pleural thickening versus a small effusion with associated atelectasis, unchanged. Electronically Signed   By: Dorise Bullion III M.D   On: 05/25/2019 16:22    EKG: Independently reviewed.   Active Problems:   * No active hospital problems. *   Assessment/Plan Abdominal bloating/early satiety and weight loss: Admit patient for further assessment and management. Have a low threshold to consult GI team for possible EGD Caloric count Low threshold to consult the dietary team Further management depend on hospital course.  Cholelithiasis with evidence of acute cholecystitis: Continue to assess. No  definitive abdominal pain reported.  History of dexamethasone use: Start patient on IV Protonix Continue to assess and monitor patient.  ESRD likely secondary to myeloma kidney on hemodialysis TTS: Continue hemodialysis TTS. Consult nephrology if patient is not discharged  Multiple myeloma: Oncology team is managing.  Hypertension: Uncontrolled. Resume home medications Further management will depend on above.  Diabetes mellitus: Sliding scale insulin coverage.  DVT prophylaxis: Subcu heparin Code Status: Full code Family Communication:  Daughter. Disposition Plan: This will depend on hospital course Consults called: Consult GI and nephrology in the morning. Admission status: Observation  Time spent: 65 minutes  Dana Allan, MD  [facility] Pager #: [phone] 7PM-7AM contact night coverage as above  05/25/2019, 8:26 PM

## 2019-05-25 NOTE — ED Notes (Signed)
Attempted to call report. Rn unavailable

## 2019-05-25 NOTE — ED Notes (Signed)
ED TO INPATIENT HANDOFF REPORT  Name/Age/Gender [name] 85 y.o. female  Code Status Code Status History    Date Active Date Inactive Code Status Order ID Comments User Context   02/25/2019 0786 03/11/2019 2007 Full Code [id number]  Georgette Shell, MD ED   02/25/2019 1300 02/25/2019 1358 Full Code [id number]  Georgette Shell, MD ED   05/16/2016 1510 05/16/2016 2008 Full Code [id number]  Thompson Grayer, MD Inpatient   03/09/2015 2011 03/10/2015 2036 Full Code [id number]  Mendel Corning, MD Inpatient   Advance Care Planning Activity      Home/SNF/Other Home  Chief Complaint CA pt  - bloating, SHoB  Level of Care/Admitting Diagnosis ED Disposition    ED Disposition Condition [facility]: [facility] [[id number]]  Level of Care: Telemetry [5]  Admit to tele based on following criteria: Complex arrhythmia (Bradycardia/Tachycardia)  Covid Evaluation: Confirmed COVID Negative  Diagnosis: Abdominal pain [110315]  Admitting Physician: Bonnell Public [[id number]]  Attending Physician: Dana Allan I [[id number]]  PT Class (Do Not Modify): Observation [104]  PT Acc Code (Do Not Modify): Observation [10022]       Medical History Past Medical History:  Diagnosis Date  . Benign paroxysmal positional vertigo 04/06/2010  . Benign positional vertigo   . 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 CAROTID BRUIT 02/24/2008  . DIABETES MELLITUS, TYPE II 02/24/2008  . Glaucoma   . HYPERTENSION 02/24/2008  . Obstructive sleep apnea   . Sick sinus syndrome (HCC)     Allergies Allergies  Allergen Reactions  . Aspirin Other (See Comments)    REACTION: STOMACH ISSUES WITH DOSE HIGHER THAN 81 MG   . Penicillins Rash    Patient took injection and tablets, had a reaction. She has taken amoxicillin with no reaction Has patient had a PCN reaction causing immediate rash, facial/tongue/throat swelling, SOB or lightheadedness with hypotension: Yes Has patient had  a PCN reaction causing severe rash involving mucus membranes or skin necrosis: Yes Has patient had a PCN reaction that required hospitalization No Has patient had a PCN reaction occurring within the last 10 years: No If all of the above answers are "NO", then m  . Brimonidine Itching  . Diltiazem Hcl Rash  . Hydrocodone-Acetaminophen Nausea And Vomiting  . Morphine Nausea And Vomiting  . Neurontin [Gabapentin] Nausea And Vomiting    IV Location/Drains/Wounds Patient Lines/Drains/Airways Status   Active Line/Drains/Airways    Name:   Placement date:   Placement time:   Site:   Days:   Peripheral IV 05/25/19 Left;Upper Arm   05/25/19    1448    Arm   less than 1   Fistula / Graft Right Forearm Arteriovenous fistula   03/11/19    0816    Forearm   75   Hemodialysis Catheter Right Internal jugular Double-lumen;Permanent   03/07/19    1008    Internal jugular   79   Incision (Closed) 03/07/19 Neck Right   03/07/19    1022     79   Incision (Closed) 03/07/19 Chest Right   03/07/19    1023     79   Incision (Closed) 03/11/19 Arm Right   03/11/19    0751     75          Labs/Imaging Results for orders placed or performed during the hospital encounter of 05/25/19 (from the past 48 hour(s))  Lipase, blood  Status: None   Collection Time: 05/25/19  2:40 PM  Result Value Ref Range   Lipase 23 11 - 51 U/L    Comment: Performed at [facility], [place] [address], [place], [place] [address]  Comprehensive metabolic panel     Status: Abnormal   Collection Time: 05/25/19  2:40 PM  Result Value Ref Range   Sodium 134 (L) 135 - 145 mmol/L   Potassium 3.9 3.5 - 5.1 mmol/L   Chloride 97 (L) 98 - 111 mmol/L   CO2 26 22 - 32 mmol/L   Glucose, Bld 79 70 - 99 mg/dL   BUN <5 (L) 8 - 23 mg/dL   Creatinine, Ser 2.72 (H) 0.44 - 1.00 mg/dL   Calcium 8.2 (L) 8.9 - 10.3 mg/dL   Total Protein 10.7 (H) 6.5 - 8.1 g/dL   Albumin 1.9 (L) 3.5 - 5.0 g/dL   AST 20 15 - 41 U/L   ALT 14 0 -  44 U/L   Alkaline Phosphatase 48 38 - 126 U/L   Total Bilirubin 0.6 0.3 - 1.2 mg/dL   GFR calc non Af Amer 15 (L) >60 mL/min   GFR calc Af Amer 18 (L) >60 mL/min   Anion gap 11 5 - 15    Comment: Performed at [facility], [place] [address], [place], [place] [address]  CBC     Status: Abnormal   Collection Time: 05/25/19  2:40 PM  Result Value Ref Range   WBC 3.5 (L) 4.0 - 10.5 K/uL   RBC 3.03 (L) 3.87 - 5.11 MIL/uL   Hemoglobin 9.1 (L) 12.0 - 15.0 g/dL   HCT 28.9 (L) 36.0 - 46.0 %   MCV 95.4 80.0 - 100.0 fL   MCH 30.0 26.0 - 34.0 pg   MCHC 31.5 30.0 - 36.0 g/dL   RDW 21.5 (H) 11.5 - 15.5 %   Platelets 134 (L) 150 - 400 K/uL    Comment: REPEATED TO VERIFY DELTA CHECK NOTED PLATELET COUNT CONFIRMED BY SMEAR    nRBC 0.0 0.0 - 0.2 %    Comment: Performed at [facility], [place] [address] [place], [place] [address]  SARS Coronavirus 2 [facility] order, Performed in [facility] hospital lab) Nasopharyngeal Nasopharyngeal Swab     Status: None   Collection Time: 05/25/19  3:50 PM   Specimen: Nasopharyngeal Swab  Result Value Ref Range   SARS Coronavirus 2 NEGATIVE NEGATIVE    Comment: (NOTE) If result is NEGATIVE SARS-CoV-2 target nucleic acids are NOT DETECTED. The SARS-CoV-2 RNA is generally detectable in upper and lower  respiratory specimens during the acute phase of infection. The lowest  concentration of SARS-CoV-2 viral copies this assay can detect is 250  copies / mL. A negative result does not preclude SARS-CoV-2 infection  and should not be used as the sole basis for treatment or other  patient management decisions.  A negative result may occur with  improper specimen collection / handling, submission of specimen other  than nasopharyngeal swab, presence of viral mutation(s) within the  areas targeted by this assay, and inadequate number of viral copies  (<250 copies / mL). A negative result must be combined with clinical  observations,  patient history, and epidemiological information. If result is POSITIVE SARS-CoV-2 target nucleic acids are DETECTED. The SARS-CoV-2 RNA is generally detectable in upper and lower  respiratory specimens dur ing the acute phase of infection.  Positive  results are indicative of active infection with SARS-CoV-2.  Clinical  correlation with patient history and other diagnostic information is  necessary to determine patient infection status.  Positive results do  not rule out bacterial infection or co-infection with other viruses. If result is PRESUMPTIVE POSTIVE SARS-CoV-2 nucleic acids MAY BE PRESENT.   A presumptive positive result was obtained on the submitted specimen  and confirmed on repeat testing.  While 2019 novel coronavirus  (SARS-CoV-2) nucleic acids may be present in the submitted sample  additional confirmatory testing may be necessary for epidemiological  and / or clinical management purposes  to differentiate between  SARS-CoV-2 and other Sarbecovirus currently known to infect humans.  If clinically indicated additional testing with an alternate test  methodology [phone]) is advised. The SARS-CoV-2 RNA is generally  detectable in upper and lower respiratory sp ecimens during the acute  phase of infection. The expected result is Negative. Fact Sheet for Patients:  StrictlyIdeas.no Fact Sheet for Healthcare Providers: BankingDealers.co.za This test is not yet approved or cleared by the [place] FDA and has been authorized for detection and/or diagnosis of SARS-CoV-2 by FDA under an Emergency Use Authorization (EUA).  This EUA will remain in effect (meaning this test can be used) for the duration of the COVID-19 declaration under Section 564(b)(1) of the Act, 21 U.S.C. section 360bbb-3(b)(1), unless the authorization is terminated or revoked sooner. Performed at [facility], [place] [address] [place], [place] [address]    Ct Abdomen Pelvis W Contrast  Result Date: 05/25/2019 CLINICAL DATA:  Abdominal distension. EXAM: CT ABDOMEN AND PELVIS WITH CONTRAST TECHNIQUE: Multidetector CT imaging of the abdomen and pelvis was performed using the standard protocol following bolus administration of intravenous contrast. CONTRAST:  <See Chart> OMNIPAQUE IOHEXOL 300 MG/ML  SOLN COMPARISON:  None. FINDINGS: Lower chest: There is a trace right-sided pleural effusion. There is a trace left-sided pleural effusion. There is atelectasis at the lung bases.The heart size is significantly enlarged. Coronary and aortic calcifications are noted. Hepatobiliary: The liver is normal. Cholelithiasis without acute inflammation.The common bile duct is mildly dilated but tapers normally towards the head of the pancreas. Pancreas: Normal contours without ductal dilatation. No peripancreatic fluid collection. Spleen: No splenic laceration or hematoma. Adrenals/Urinary Tract: --Adrenal glands: No adrenal hemorrhage. --Right kidney/ureter: No hydronephrosis or perinephric hematoma. --Left kidney/ureter: No hydronephrosis or perinephric hematoma. --Urinary bladder: Unremarkable. Stomach/Bowel: --Stomach/Duodenum: No hiatal hernia or other gastric abnormality. Normal duodenal course and caliber. --Small bowel: No dilatation or inflammation. --Colon: No focal abnormality. --Appendix: Not visualized. No right lower quadrant inflammation or free fluid. Vascular/Lymphatic: Atherosclerotic calcification is present within the non-aneurysmal abdominal aorta, without hemodynamically significant stenosis. --No retroperitoneal lymphadenopathy. --No mesenteric lymphadenopathy. --No pelvic or inguinal lymphadenopathy. Reproductive: Status post hysterectomy. No adnexal mass. Other: There is a trace amount of free fluid in the abdomen and pelvis. There is mild body wall edema. Musculoskeletal. There is a lytic lesion in the L1 vertebral body. There  are advanced degenerative changes throughout the visualized thoracolumbar spine. There is no displaced fracture. IMPRESSION: 1. No acute intra-abdominal abnormality detected. 2. Small volume free fluid in the abdomen and pelvis. 3. There is cholelithiasis without secondary signs of acute cholecystitis. 4. Heterogeneous appearance of the osseous structures including a lytic lesion in the L1 vertebral body is consistent with the patient's reported history of multiple myeloma. There is no acute displaced fracture. 5. Cardiomegaly with trace bilateral pleural effusions and adjacent atelectasis. Electronically Signed   By: Constance Holster M.D.   On: 05/25/2019 18:37  Dg Chest Port 1 View  Result Date: 05/25/2019 CLINICAL DATA:  Shortness of breath. EXAM: PORTABLE CHEST 1 VIEW COMPARISON:  Mar 01, 2019 FINDINGS: A right I will is catheter is in good position. No pneumothorax. Cardiomegaly. No overt edema. Mild opacity in left base favored represent atelectasis, and perhaps a tiny effusion or pleural thickening, unchanged. IMPRESSION: 1. Cardiomegaly.  Stable pacemaker. 2. The dialysis catheter is in good position.  No pneumothorax. 3. Opacity in the left retrocardiac region is likely pleural thickening versus a small effusion with associated atelectasis, unchanged. Electronically Signed   By: Dorise Bullion III M.D   On: 05/25/2019 16:22    Pending Labs Unresulted Labs (From admission, onward)    Start     Ordered   05/25/19 1402  Urinalysis, Routine w reflex microscopic  ONCE - STAT,   STAT     05/25/19 1401   Signed and Held  CBC  (heparin)  Once,   R    Comments: Baseline for heparin therapy IF NOT ALREADY DRAWN.  Notify MD if PLT < 100 K.    Signed and Held   Signed and Held  Creatinine, serum  (heparin)  Once,   R    Comments: Baseline for heparin therapy IF NOT ALREADY DRAWN.    Signed and Held   Signed and Held  Magnesium  Once,   R     Signed and Held   Signed and Held  Phosphorus  Once,    R     Signed and Held   Signed and Held  Basic metabolic panel  Tomorrow morning,   R     Signed and Held   Signed and Held  CBC  Tomorrow morning,   R     Signed and Held          Vitals/Pain Today's Vitals   05/25/19 1630 05/25/19 1641 05/25/19 1730 05/25/19 1944  BP: (!) 172/63 (!) 172/63 (!) 173/59 (!) 176/63  Pulse: 70 77 73 82  Resp:  18  19  Temp:      TempSrc:      SpO2: 96% 98% 97% 96%  PainSc:        Isolation Precautions No active isolations  Medications Medications  sodium chloride flush (NS) 0.9 % injection 3 mL (3 mLs Intravenous Given 05/25/19 1826)  iohexol (OMNIPAQUE) 300 MG/ML solution 100 mL (100 mLs Intravenous Contrast Given 05/25/19 1752)    Mobility walks with device

## 2019-05-25 NOTE — ED Notes (Signed)
Pt keeps requesting something to drink, it has been explained multiple times we can not allow her to drink anything until test results are back.

## 2019-05-25 NOTE — ED Notes (Addendum)
Pt able to tolerate fluids without any reports of emesis

## 2019-05-25 NOTE — ED Notes (Signed)
DIET ORDERED PLACED ON WRONG PT .

## 2019-05-25 NOTE — ED Provider Notes (Signed)
[facility] DEPT Provider Note   CSN: [id number] Arrival date & time: 05/25/19  1351     History   Chief Complaint Chief Complaint  Patient presents with   Abdominal Pain   Bloated    HPI [name] is a 85 y.o. female.     HPI  85 year old female end-stage renal disease, on dialysis Tuesday Thursday Saturday, last dialyzed yesterday, presents complaining of abdominal pain.  She states that after she had her lunch yesterday she felt very bloated.  She had poor appetite last night secondary to the sensation of bloating.  She does not describe it as acute pain.  She states that she continues to feel very bloated and nauseated but has not been vomiting.  She describes her bowel movement today is a small but denies any diarrhea.  She denies fever, chills, chest pain, cough, or dyspnea.  She states that she has urge to urinate and only urinates a few drops at a time but this is unchanged her normal for her. Primary care is Dr.  Criss Rosales She has had a hysterectomy and appendectomy, but states that she has not had her gallbladder removed. Past Medical History:  Diagnosis Date   Benign paroxysmal positional vertigo 04/06/2010   Benign positional vertigo    BRADYCARDIA 01/27/2009   s/p PPM   CAROTID BRUIT 02/24/2008   DIABETES MELLITUS, TYPE II 02/24/2008   Glaucoma    HYPERTENSION 02/24/2008   Obstructive sleep apnea    Sick sinus syndrome [facility])     Patient Active Problem List   Diagnosis Date Noted   Yeast infection of the skin 04/04/2019   Cancer associated pain 03/26/2019   Nausea in adult 03/14/2019   ESRD (end stage renal disease) on dialysis ([place]) 03/14/2019   Other constipation 03/14/2019   Pancytopenia, acquired ([place]) 03/12/2019   Multiple myeloma not having achieved remission ([place]) 03/03/2019   AKI (acute kidney injury) ([place]) 02/25/2019   Hypokalemia 02/25/2019   Acute renal failure ([place])    Anemia    Chest pain  03/09/2015   Shingles 03/09/2015   Sick sinus syndrome ([place]) 02/03/2015   Shortness of breath 02/01/2014   Fatigue 02/01/2014   BENIGN PAROXYSMAL POSITIONAL VERTIGO 04/06/2010   BRADYCARDIA 01/27/2009   Diabetes mellitus ([place]) 02/24/2008   Obstructive sleep apnea 02/24/2008   PERIODIC LIMB MOVEMENT DISORDER 02/24/2008   Essential hypertension 02/24/2008   ALLERGIC RHINITIS 02/24/2008   CAROTID BRUIT 02/24/2008    Past Surgical History:  Procedure Laterality Date   ABDOMINAL HYSTERECTOMY     1980's   AV FISTULA PLACEMENT Right 03/11/2019   Procedure: CREATION RIGHT ARM RADIOCEPHALIC ARTERIOVENOUS FISTULA;  Surgeon: Angelia Mould, MD;  Location: [facility];  Service: Vascular;  Laterality: Right;   BACK SURGERY     x 3   EP IMPLANTABLE DEVICE N/A 05/16/2016   Procedure: PPM Generator Changeout;  Surgeon: Thompson Grayer, MD;  Location: [facility] CV LAB;  Service: Cardiovascular;  Laterality: N/A;   FISTULA SUPERFICIALIZATION Right 03/11/2019   Procedure: FISTULA SUPERFICIALIZATION;  Surgeon: Angelia Mould, MD;  Location: [facility] OR;  Service: Vascular;  Laterality: Right;   IR FLUORO GUIDE CV LINE RIGHT  03/07/2019   IR US GUIDE [place] RIGHT  03/07/2019   KNEE SURGERY Right    2003   KNEE SURGERY Left    2011   PACEMAKER INSERTION       OB History   No obstetric history on file.  Home Medications    Prior to Admission medications   Medication Sig Start Date End Date Taking? Authorizing Provider  acyclovir (ZOVIRAX) 400 MG tablet Take 1 tablet (400 mg total) by mouth daily. 04/25/19   Heath Lark, MD  allopurinol (ZYLOPRIM) 100 MG tablet Take 0.5 tablets (50 mg total) by mouth daily. 03/12/19   Nita Sells, MD  Coenzyme Q10 (CO Q 10 PO) Take 1 tablet by mouth daily.     [provider]  dexamethasone (DECADRON) 4 MG tablet TAKE 1 TABLET BY MOUTH EVERY DAY 04/17/19   Heath Lark, MD  dorzolamide (TRUSOPT) 2 % ophthalmic  solution Place 1 drop into the left eye every evening.  12/21/17   [provider]  fluorometholone (FML) 0.1 % ophthalmic suspension Place 1 drop into the right eye 3 (three) times daily.  02/01/19   [provider]  glimepiride (AMARYL) 1 MG tablet Take 0.5 mg by mouth 2 (two) times daily.     [provider]  HYDROcodone-acetaminophen (NORCO/VICODIN) 5-325 MG tablet Take 1 tablet by mouth every 6 (six) hours as needed for moderate pain. 03/26/19   Heath Lark, MD  isosorbide-hydrALAZINE (BIDIL) 20-37.5 MG tablet Take 1 tablet by mouth 2 (two) times a day. 03/11/19   Nita Sells, MD  lactulose (CHRONULAC) 10 GM/15ML solution TAKE 15 MLS (10 G TOTAL) BY MOUTH 2 (TWO) TIMES DAILY. 05/19/19   Gorsuch, Ni, MD  LUMIGAN 0.01 % SOLN Place 1 drop into the left eye every evening.  02/01/19   [provider]  LYRICA 50 MG capsule Take 50 mg by mouth daily.  06/14/15   [provider]  Multiple Vitamin (MULTIVITAMIN WITH MINERALS) TABS tablet Take 1 tablet by mouth daily.    [provider]  nystatin (MYCOSTATIN/NYSTOP) powder Apply topically 4 (four) times daily. 04/04/19   Heath Lark, MD  ondansetron (ZOFRAN) 4 MG tablet Take 1 tablet (4 mg total) by mouth every 8 (eight) hours as needed for nausea. 03/14/19   Heath Lark, MD  pantoprazole (PROTONIX) 40 MG tablet Take 40 mg by mouth daily. 02/20/19   [provider]  polyethylene glycol (MIRALAX / GLYCOLAX) 17 g packet Take 5.6 g by mouth at bedtime.    [provider]  prochlorperazine (COMPAZINE) 5 MG tablet Take 1 tablet (5 mg total) by mouth every 6 (six) hours as needed for nausea or vomiting. 03/14/19   Heath Lark, MD  senna-docusate (SENOKOT-S) 8.6-50 MG tablet Take 2 tablets by mouth 2 (two) times daily. 03/11/19   Nita Sells, MD  sevelamer carbonate (RENVELA) 2.4 g PACK Take 2.4 g by mouth 3 (three) times daily with meals. 03/11/19   Nita Sells, MD  sodium  chloride (OCEAN) 0.65 % SOLN nasal spray Place 1 spray into both nostrils as needed for congestion.    [provider]    Family History Family History  Problem Relation Age of Onset   Congestive Heart Failure Mother    Tuberculosis Mother    Multiple myeloma Mother    Bone cancer Father    Arthritis Father    Breast cancer Sister    Colon cancer Sister    Hypertension Sister    Dementia Sister    Alcohol abuse Son     Social History Social History   Tobacco Use   Smoking status: Never Smoker   Smokeless tobacco: Never Used  Substance Use Topics   Alcohol use: No   Drug use: No     Allergies  Aspirin, Penicillins, Brimonidine, Diltiazem hcl, Hydrocodone-acetaminophen, Morphine, and Neurontin [gabapentin]   Review of Systems Review of Systems  Constitutional: Negative for activity change, appetite change, chills, diaphoresis, fatigue, fever and unexpected weight change.  HENT: Negative.   Eyes: Negative.   Respiratory: Negative.   Cardiovascular: Negative.   Gastrointestinal: Negative.   Endocrine: Negative.   Genitourinary: Positive for difficulty urinating.  Musculoskeletal: Negative.   Skin: Positive for wound.       Patient feels that she is having some skin breakdown on her low back perirectal area  Neurological: Negative.   Hematological: Negative.   Psychiatric/Behavioral: Negative.   All other systems reviewed and are negative.    Physical Exam Updated Vital Signs BP (!) 197/84    Pulse 78    Temp 98 F (36.7 C) (Oral)    Resp 18    SpO2 100%   Physical Exam Vitals signs and nursing note reviewed.  Constitutional:      General: She is not in acute distress.    Appearance: She is well-developed. She is obese. She is not ill-appearing.  HENT:     Head: Normocephalic and atraumatic.     Mouth/Throat:     Mouth: Mucous membranes are moist.  Eyes:     Extraocular Movements: Extraocular movements intact.     Comments:  Right eye is nonreactive patient has history of herpes  Cardiovascular:     Rate and Rhythm: Normal rate and regular rhythm.  Pulmonary:     Effort: Pulmonary effort is normal.     Breath sounds: Normal breath sounds.  Abdominal:     General: Abdomen is flat. Bowel sounds are normal.     Palpations: Abdomen is soft.     Tenderness: There is abdominal tenderness in the epigastric area.  Genitourinary:    Rectum: Normal.  Skin:    Capillary Refill: Capillary refill takes less than 2 seconds.     Comments: Stool is yellow no blood Some very early skin breakdown in the sacral area  Neurological:     Mental Status: She is alert.      ED Treatments / Results  Labs (all labs ordered are listed, but only abnormal results are displayed) Labs Reviewed  LIPASE, BLOOD  COMPREHENSIVE METABOLIC PANEL  CBC  URINALYSIS, ROUTINE W REFLEX MICROSCOPIC    EKG None  Radiology Ct Abdomen Pelvis W Contrast  Result Date: 05/25/2019 CLINICAL DATA:  Abdominal distension. EXAM: CT ABDOMEN AND PELVIS WITH CONTRAST TECHNIQUE: Multidetector CT imaging of the abdomen and pelvis was performed using the standard protocol following bolus administration of intravenous contrast. CONTRAST:  <See Chart> OMNIPAQUE IOHEXOL 300 MG/ML  SOLN COMPARISON:  None. FINDINGS: Lower chest: There is a trace right-sided pleural effusion. There is a trace left-sided pleural effusion. There is atelectasis at the lung bases.The heart size is significantly enlarged. Coronary and aortic calcifications are noted. Hepatobiliary: The liver is normal. Cholelithiasis without acute inflammation.The common bile duct is mildly dilated but tapers normally towards the head of the pancreas. Pancreas: Normal contours without ductal dilatation. No peripancreatic fluid collection. Spleen: No splenic laceration or hematoma. Adrenals/Urinary Tract: --Adrenal glands: No adrenal hemorrhage. --Right kidney/ureter: No hydronephrosis or perinephric  hematoma. --Left kidney/ureter: No hydronephrosis or perinephric hematoma. --Urinary bladder: Unremarkable. Stomach/Bowel: --Stomach/Duodenum: No hiatal hernia or other gastric abnormality. Normal duodenal course and caliber. --Small bowel: No dilatation or inflammation. --Colon: No focal abnormality. --Appendix: Not visualized. No right lower quadrant inflammation or free fluid. Vascular/Lymphatic: Atherosclerotic calcification is present within the  non-aneurysmal abdominal aorta, without hemodynamically significant stenosis. --No retroperitoneal lymphadenopathy. --No mesenteric lymphadenopathy. --No pelvic or inguinal lymphadenopathy. Reproductive: Status post hysterectomy. No adnexal mass. Other: There is a trace amount of free fluid in the abdomen and pelvis. There is mild body wall edema. Musculoskeletal. There is a lytic lesion in the L1 vertebral body. There are advanced degenerative changes throughout the visualized thoracolumbar spine. There is no displaced fracture. IMPRESSION: 1. No acute intra-abdominal abnormality detected. 2. Small volume free fluid in the abdomen and pelvis. 3. There is cholelithiasis without secondary signs of acute cholecystitis. 4. Heterogeneous appearance of the osseous structures including a lytic lesion in the L1 vertebral body is consistent with the patient's reported history of multiple myeloma. There is no acute displaced fracture. 5. Cardiomegaly with trace bilateral pleural effusions and adjacent atelectasis. Electronically Signed   By: Constance Holster M.D.   On: 05/25/2019 18:37   Dg Chest Port 1 View  Result Date: 05/25/2019 CLINICAL DATA:  Shortness of breath. EXAM: PORTABLE CHEST 1 VIEW COMPARISON:  Mar 01, 2019 FINDINGS: A right I will is catheter is in good position. No pneumothorax. Cardiomegaly. No overt edema. Mild opacity in left base favored represent atelectasis, and perhaps a tiny effusion or pleural thickening, unchanged. IMPRESSION: 1. Cardiomegaly.   Stable pacemaker. 2. The dialysis catheter is in good position.  No pneumothorax. 3. Opacity in the left retrocardiac region is likely pleural thickening versus a small effusion with associated atelectasis, unchanged. Electronically Signed   By: Dorise Bullion III M.D   On: 05/25/2019 16:22    Procedures Procedures (including critical care time)  Medications Ordered in ED Medications  sodium chloride flush (NS) 0.9 % injection 3 mL (3 mLs Intravenous Given 05/25/19 1513)     Initial Impression / Assessment and Plan / ED Course  I have reviewed the triage vital signs and the nursing notes.  Pertinent labs & imaging results that were available during my care of the patient were reviewed by me and considered in my medical decision making (see chart for details).       85 year old female end-stage renal disease on dialysis presents today complaining of epigastric discomfort.  She is tender to palpation in the epigastrium.  Labs reveal no evidence of elevated lipase, LFTs, or leukocytosis.  CT obtained and shows gallstones but no evidence of acute cholecystitis and otherwise no acute intra-abdominal abnormalities.  Plan oral trial fluid. Patient only able to take about 1 ounce of fluid.  She continues to complain of epigastric and right upper quadrant pain.  Given the gallstones seen on the CT scan, advanced age, and comorbidities, plan admission for ongoing treatment and evaluation Discussed with Dr. Marthenia [place] Nephew see for admission Final Clinical Impressions(s) / ED Diagnoses   Final diagnoses:  Epigastric pain  Nausea and vomiting, intractability of vomiting not specified, unspecified vomiting type    ED Discharge Orders    None       Pattricia Boss, MD 05/25/19 2041

## 2019-05-26 DIAGNOSIS — K8020 Calculus of gallbladder without cholecystitis without obstruction: Secondary | ICD-10-CM | POA: Diagnosis not present

## 2019-05-26 DIAGNOSIS — N186 End stage renal disease: Secondary | ICD-10-CM | POA: Diagnosis not present

## 2019-05-26 DIAGNOSIS — L8990 Pressure ulcer of unspecified site, unspecified stage: Secondary | ICD-10-CM | POA: Insufficient documentation

## 2019-05-26 DIAGNOSIS — R1013 Epigastric pain: Secondary | ICD-10-CM | POA: Diagnosis not present

## 2019-05-26 DIAGNOSIS — D61818 Other pancytopenia: Secondary | ICD-10-CM

## 2019-05-26 DIAGNOSIS — R634 Abnormal weight loss: Secondary | ICD-10-CM

## 2019-05-26 LAB — BASIC METABOLIC PANEL
Anion gap: 9 (ref 5–15)
BUN: 5 mg/dL — ABNORMAL LOW (ref 8–23)
CO2: 24 mmol/L (ref 22–32)
Calcium: 7.7 mg/dL — ABNORMAL LOW (ref 8.9–10.3)
Chloride: 99 mmol/L (ref 98–111)
Creatinine, Ser: 3.57 mg/dL — ABNORMAL HIGH (ref 0.44–1.00)
GFR calc Af Amer: 13 mL/min — ABNORMAL LOW (ref >60)
GFR calc non Af Amer: 11 mL/min — ABNORMAL LOW (ref >60)
Glucose, Bld: 66 mg/dL — ABNORMAL LOW (ref 70–99)
Potassium: 3.8 mmol/L (ref 3.5–5.1)
Sodium: 132 mmol/L — ABNORMAL LOW (ref 135–145)

## 2019-05-26 LAB — MRSA PCR SCREENING: MRSA by PCR: NEGATIVE

## 2019-05-26 LAB — CBC
HCT: 23.5 % — ABNORMAL LOW (ref 36.0–46.0)
Hemoglobin: 7.4 g/dL — ABNORMAL LOW (ref 12.0–15.0)
MCH: 30.8 pg (ref 26.0–34.0)
MCHC: 31.5 g/dL (ref 30.0–36.0)
MCV: 97.9 fL (ref 80.0–100.0)
Platelets: 104 10*3/uL — ABNORMAL LOW (ref 150–400)
RBC: 2.4 MIL/uL — ABNORMAL LOW (ref 3.87–5.11)
RDW: 21.8 % — ABNORMAL HIGH (ref 11.5–15.5)
WBC: 3 10*3/uL — ABNORMAL LOW (ref 4.0–10.5)
nRBC: 0 % (ref 0.0–0.2)

## 2019-05-26 LAB — GLUCOSE, CAPILLARY
Glucose-Capillary: 132 mg/dL — ABNORMAL HIGH (ref 70–99)
Glucose-Capillary: 64 mg/dL — ABNORMAL LOW (ref 70–99)
Glucose-Capillary: 71 mg/dL (ref 70–99)
Glucose-Capillary: 79 mg/dL (ref 70–99)
Glucose-Capillary: 88 mg/dL (ref 70–99)

## 2019-05-26 MED ORDER — DEXAMETHASONE 4 MG PO TABS: 4.0000 mg | ORAL_TABLET | ORAL | Status: DC

## 2019-05-26 MED ORDER — ORAL CARE MOUTH RINSE
15.0000 mL | Freq: Two times a day (BID) | OROMUCOSAL | Status: DC
  Administered 2019-05-26: 15 mL via OROMUCOSAL

## 2019-05-26 MED ORDER — PANTOPRAZOLE SODIUM 40 MG PO TBEC: 40.0000 mg | DELAYED_RELEASE_TABLET | Freq: Every day | ORAL | 1 refills | Status: DC

## 2019-05-26 MED ORDER — POTASSIUM PHOSPHATES 15 MMOLE/5ML IV SOLN
20.0000 mmol | Freq: Once | INTRAVENOUS | Status: AC
  Administered 2019-05-26: 20 mmol via INTRAVENOUS
  Filled 2019-05-26: qty 6.67

## 2019-05-26 MED ORDER — GLUCOSE 40 % PO GEL
ORAL | Status: AC
  Administered 2019-05-26: 1
  Filled 2019-05-26: qty 1

## 2019-05-26 NOTE — Progress Notes (Signed)
Discharge paperwork, medication regimen, & follow up appointments reviewed with patient by [name]. Patient escorted to car by NT.

## 2019-05-26 NOTE — Discharge Summary (Signed)
Physician Discharge Summary  [name] BJY:[id number] DOB: 11/17/1929 DOA: 05/25/2019  PCP: Lucianne Lei, MD Nephrology Dr. Lorrene Reid Oncology Dr. Candis Schatz GI Dr. Cristina Gong   Admit date: 05/25/2019 Discharge date: 05/26/2019  Recommendations for Outpatient Follow-up:   Abdominal bloating, early satiety, weight loss, epigastric and right upper quadrant pain   Follow-up Information    Ronald Lobo, MD Follow up.   Specialty: Gastroenterology Why: Office will contact you with appointment Contact information: [address] [place] [address] [phone]            Discharge Diagnoses: Principal diagnosis is #1 1. Abdominal bloating, early satiety, weight loss, epigastric and right upper quadrant pain 2. Cholelithiasis 3. Hypophosphatemia 4. Multiple myeloma not in remission 5. Acquired pancytopenia 6. ESRD on hemodialysis Tuesday, Thursday, Saturday 7. Diabetes mellitus type 2 with peripheral neuropathy 8. Chronic severe constipation   Discharge Condition: improved Disposition: home  Diet recommendation: Dialysis appropriate diet, bland  Filed Weights   05/25/19 2215  Weight: 70.5 kg    History of present illness:  85 year old woman PMH including multiple myeloma last chemotherapy 8/7; ESRD Tuesday Thursday Saturday, last session 8/8; chronic severe constipation; Presented with abdominal bloating, epigastric and right upper quadrant tenderness and shortness of breath as well as early satiety, weight loss of about 35 pounds.  CT showed cholelithiasis. Admitted for further evaluation of bloating, pain, weight loss.  Hospital Course:  The following morning the patient felt well, bloating had significantly diminished and she tolerated diet.  No abdominal pain of significance.  No nausea or vomiting today.  Breathing fine.  Detailed history, chronicity suggested esophageal or gastric etiology.  CT was unrevealing other than cholelithiasis which does  not appear to be symptomatic at this point.  I discussed the case with Dr. Watt Climes I think the patient stable for outpatient evaluation, he recommended follow-up with Dr. Cristina Gong for consideration of EGD.  Patient started on PPI.  Discussed in detail with patient she agreed.  Individual issues as below.  Abdominal bloating, early satiety, weight loss, epigastric and right upper quadrant pain.  Detailed history was obtained she reports a long history of early satiety and bloating after meals particularly in the afternoon, curiously not on breakfast.  No regurgitation or vomiting.  No eructation.  She has a history of chronic constipation which appears to be stable.  She reports a 25 pound weight loss.  She gets bloating with pain in the epigastric area.  It does radiate somewhat to the right upper quadrant.  Reports 25 pound weight loss.  Able to eat cream of wheat and toast at breakfast but gets bloating and early satiety in the afternoon with meals. --Per Dr. Alvy Bimler 03/14/2019: "I suspect a big component of her nausea&vomiting is due to severe constipation I have extensive discussion with her son over the telephone about aggressive laxative therapy" --CT abdomen pelvis this admission no acute abnormalities noted.  Cholelithiasis seen.  LFTs and lipase unremarkable. I discussed with Dr. Earma Reading, there is no significant stool burden on CT. --Patient is on chronic steroids.    Will refer to gastroenterology as an outpatient for consideration of upper endoscopy given weight loss, early satiety, bloating and epigastric pain to rule out PUD.  Patient appears clinically stable for outpatient evaluation. --Weights 8/9 70.5 kg. 7/31 71.3 kg 7/10 73.7 kg 6/19 74.8 kg 01/2018 86.6 kg  Cholelithiasis --Lipase and LFTs unremarkable.  Neither history nor exam suggest acute or chronic cholecystitis.  Hypophosphatemia --Replete  Multiple myeloma  not in remission, followed by Dr. Alvy Bimler.  Last seen 7/31.   Plan at that time was to continue weekly chemotherapy and twice a week dexamethasone.  Acyclovir for antimicrobial prophylaxis. --Continue acyclovir, allopurinol, dexamethasone  Acquired pancytopenia.  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 --Trending down, presumably related to chemotherapy 3 days ago.  Follow-up with oncology as an outpatient.  ESRD on hemodialysis Tuesday, Thursday, Saturday --Next session tomorrow 8/11.  Diabetes mellitus type 2 with peripheral neuropathy --Hold glimepiride.  Continue Lyrica, sliding scale insulin.  Chronic severe constipation on MiraLAX and Senokot as an outpatient --No significant stool burden seen on CT.  Last bowel movement 8/9.  Continue MiraLAX and senna.  PMH right eye herpes ophthalmicus, sick sinus syndrome status post permanent pacemaker placement  Early skin breakdown over the sacrum per EDP --Local wound care.  Significant Hospital Events    8/9 admitted for evaluation of abdominal bloating, epigastric and right upper quadrant pain, early satiety, weight loss  Consults:   discussed with Dr. Watt Climes, will refer to outpt setting Dr. Wallis Mart  Procedures:     Significant Diagnostic Tests:   8/9 chest x-ray no acute disease  8/9 CT abdomen pelvis, no acute abnormality.  Cholelithiasis without acute cholecystitis.  Micro Data:   SARS-CoV-2 negative  Today's assessment: S: Feels better today.  Bloating is much less pronounced.  No bowel movement here.  Did have a bowel movement, small yesterday.  She reports a long history of early satiety and bloating after meals particularly in the afternoon, curiously not on breakfast.  No regurgitation or vomiting.  No eructation.  She has a history of chronic constipation which appears to be stable.  She reports a 25 pound weight loss.  She gets bloating with pain in the epigastric area.  It does radiate somewhat to the right upper quadrant. O: Vitals:  Vitals:   05/26/19  0422 05/26/19 1323  BP: (!) 136/48 (!) 147/64  Pulse: 76 72  Resp: 19 18  Temp: 98.8 F (37.1 C) 98.2 F (36.8 C)  SpO2: 97% 97%    Constitutional:   Appears calm and comfortable sitting in a chair Eyes:   Keeps right eye mostly closed ENMT:   grossly normal hearing   Lips appear normal Respiratory:   CTA bilaterally, no w/r/r.   Respiratory effort normal.  Cardiovascular:   RRR, no m/r/g  No LE extremity edema   Abdomen:   Abdomen appears normal; soft, mildly distended, tympanic, positive bowel sounds.  Mild epigastric pain with deep palpation.  Mild right upper quadrant pain with deep palpation. Psychiatric:   Mental status ? Mood, affect appropriate  Today's Data   Afebrile, vital signs stable  A.m. hypoglycemia of 64.  Repeat 88.  Potassium within normal limits.  Creatinine consistent with end-stage renal disease.  Anion gap within normal limits.  WBC stable at 3.0, hemoglobin slightly lower at 7.4, platelets lower at 104.   Discharge Instructions  Discharge Instructions    Activity as tolerated - No restrictions   Complete by: As directed    Discharge instructions   Complete by: As directed    Call your physician or seek immediate medical attention for vomiting, inability to eat, ongoing weight loss, unrelieved constipation or worsening of condition.  The gastroenterology office will contact you for an appointment.  Continue appropriate diet for dialysis, recommend bland diet.     Allergies as of 05/26/2019      Reactions   Aspirin Other (See Comments)   REACTION:  STOMACH ISSUES WITH DOSE HIGHER THAN 81 MG  Other reaction(s): GI Upset (intolerance), Other (See Comments) REACTION: STOMACH ISSUES WITH DOSE HIGHER THAN 81 MG  REACTION: STOMACH ISSUES WITH DOSE HIGHER THAN 81 MG    Hydrocodone-acetaminophen Nausea And Vomiting   Morphine Nausea And Vomiting   Penicillins Rash   Patient took injection and tablets, had a reaction. She has taken  amoxicillin with no reaction Has patient had a PCN reaction causing immediate rash, facial/tongue/throat swelling, SOB or lightheadedness with hypotension: Yes Has patient had a PCN reaction causing severe rash involving mucus membranes or skin necrosis: Yes Has patient had a PCN reaction that required hospitalization No Has patient had a PCN reaction occurring within the last 10 years: No If all of the above answers are "NO", then m Patient took injection and tablets, had a reaction. She has taken amoxicillin with no reaction Patient took injection and tablets, had a reaction. She has taken amoxicillin with no reaction Has patient had a PCN reaction causing immediate rash, facial/tongue/throat swelling, SOB or lightheadedness with hypotension: Yes Has patient had a PCN reaction causing severe rash involving mucus membranes or skin necrosis: Yes Has patient had a PCN reaction that required hospitalization No Has patient had a PCN reaction occurring within the last 10 years: No If all of the above answers are "NO", then m   Brimonidine Itching   Diltiazem Hcl Rash   Hydrocodone-acetaminophen Nausea And Vomiting   Neurontin [gabapentin] Nausea And Vomiting      Medication List    STOP taking these medications   HYDROcodone-acetaminophen 5-325 MG tablet Commonly known as: NORCO/VICODIN     TAKE these medications   acetaminophen 500 MG tablet Commonly known as: TYLENOL Take 500 mg by mouth 2 (two) times daily as needed for moderate pain.   acyclovir 400 MG tablet Commonly known as: ZOVIRAX Take 1 tablet (400 mg total) by mouth daily.   allopurinol 100 MG tablet Commonly known as: ZYLOPRIM Take 0.5 tablets (50 mg total) by mouth daily.   CO Q 10 PO Take 1 tablet by mouth daily.   dexamethasone 4 MG tablet Commonly known as: DECADRON Take 1 tablet (4 mg total) by mouth 2 (two) times a week.   Dialyvite 800-Zinc 15 0.8 MG Tabs Take 1 tablet by mouth daily.   dorzolamide 2 %  ophthalmic solution Commonly known as: TRUSOPT Place 1 drop into the left eye every evening.   fluorometholone 0.1 % ophthalmic suspension Commonly known as: FML Place 1 drop into the right eye 3 (three) times daily.   isosorbide-hydrALAZINE 20-37.5 MG tablet Commonly known as: BIDIL Take 1 tablet by mouth 2 (two) times a day.   lactulose 10 GM/15ML solution Commonly known as: CHRONULAC TAKE 15 MLS (10 G TOTAL) BY MOUTH 2 (TWO) TIMES DAILY.   Lumigan 0.01 % Soln Generic drug: bimatoprost Place 1 drop into the left eye every evening.   Lyrica 50 MG capsule Generic drug: pregabalin Take 50 mg by mouth at bedtime.   multivitamin with minerals Tabs tablet Take 1 tablet by mouth daily.   nystatin powder Commonly known as: MYCOSTATIN/NYSTOP Apply topically 4 (four) times daily.   ondansetron 4 MG tablet Commonly known as: ZOFRAN Take 1 tablet (4 mg total) by mouth every 8 (eight) hours as needed for nausea.   pantoprazole 40 MG tablet Commonly known as: Protonix Take 1 tablet (40 mg total) by mouth daily.   prochlorperazine 5 MG tablet Commonly known as: COMPAZINE Take 1  tablet (5 mg total) by mouth every 6 (six) hours as needed for nausea or vomiting.   senna-docusate 8.6-50 MG tablet Commonly known as: Senokot-S Take 2 tablets by mouth 2 (two) times daily.   sevelamer carbonate 2.4 g Pack Commonly known as: Renvela Take 2.4 g by mouth 3 (three) times daily with meals.   sevelamer carbonate 800 MG tablet Commonly known as: RENVELA Take 800 mg by mouth See admin instructions. Take 3 tablets by mouth three times a day with meals and 1 tablet by mouth twice a day with snacks   simethicone 80 MG chewable tablet Commonly known as: MYLICON Chew 80 mg by mouth every 6 (six) hours as needed for flatulence.   sodium chloride 0.65 % Soln nasal spray Commonly known as: OCEAN Place 1 spray into both nostrils as needed for congestion.      Allergies  Allergen Reactions    Aspirin Other (See Comments)    REACTION: STOMACH ISSUES WITH DOSE HIGHER THAN 81 MG  Other reaction(s): GI Upset (intolerance), Other (See Comments) REACTION: STOMACH ISSUES WITH DOSE HIGHER THAN 81 MG  REACTION: STOMACH ISSUES WITH DOSE HIGHER THAN 81 MG    Hydrocodone-Acetaminophen Nausea And Vomiting   Morphine Nausea And Vomiting   Penicillins Rash    Patient took injection and tablets, had a reaction. She has taken amoxicillin with no reaction Has patient had a PCN reaction causing immediate rash, facial/tongue/throat swelling, SOB or lightheadedness with hypotension: Yes Has patient had a PCN reaction causing severe rash involving mucus membranes or skin necrosis: Yes Has patient had a PCN reaction that required hospitalization No Has patient had a PCN reaction occurring within the last 10 years: No If all of the above answers are "NO", then m Patient took injection and tablets, had a reaction. She has taken amoxicillin with no reaction Patient took injection and tablets, had a reaction. She has taken amoxicillin with no reaction Has patient had a PCN reaction causing immediate rash, facial/tongue/throat swelling, SOB or lightheadedness with hypotension: Yes Has patient had a PCN reaction causing severe rash involving mucus membranes or skin necrosis: Yes Has patient had a PCN reaction that required hospitalization No Has patient had a PCN reaction occurring within the last 10 years: No If all of the above answers are "NO", then m   Brimonidine Itching   Diltiazem Hcl Rash   Hydrocodone-Acetaminophen Nausea And Vomiting   Neurontin [Gabapentin] Nausea And Vomiting    The results of significant diagnostics from this hospitalization (including imaging, microbiology, ancillary and laboratory) are listed below for reference.    Significant Diagnostic Studies: Ct Abdomen Pelvis W Contrast  Result Date: 05/25/2019 CLINICAL DATA:  Abdominal distension. EXAM: CT ABDOMEN AND  PELVIS WITH CONTRAST TECHNIQUE: Multidetector CT imaging of the abdomen and pelvis was performed using the standard protocol following bolus administration of intravenous contrast. CONTRAST:  <See Chart> OMNIPAQUE IOHEXOL 300 MG/ML  SOLN COMPARISON:  None. FINDINGS: Lower chest: There is a trace right-sided pleural effusion. There is a trace left-sided pleural effusion. There is atelectasis at the lung bases.The heart size is significantly enlarged. Coronary and aortic calcifications are noted. Hepatobiliary: The liver is normal. Cholelithiasis without acute inflammation.The common bile duct is mildly dilated but tapers normally towards the head of the pancreas. Pancreas: Normal contours without ductal dilatation. No peripancreatic fluid collection. Spleen: No splenic laceration or hematoma. Adrenals/Urinary Tract: --Adrenal glands: No adrenal hemorrhage. --Right kidney/ureter: No hydronephrosis or perinephric hematoma. --Left kidney/ureter: No hydronephrosis or perinephric hematoma. --Urinary  bladder: Unremarkable. Stomach/Bowel: --Stomach/Duodenum: No hiatal hernia or other gastric abnormality. Normal duodenal course and caliber. --Small bowel: No dilatation or inflammation. --Colon: No focal abnormality. --Appendix: Not visualized. No right lower quadrant inflammation or free fluid. Vascular/Lymphatic: Atherosclerotic calcification is present within the non-aneurysmal abdominal aorta, without hemodynamically significant stenosis. --No retroperitoneal lymphadenopathy. --No mesenteric lymphadenopathy. --No pelvic or inguinal lymphadenopathy. Reproductive: Status post hysterectomy. No adnexal mass. Other: There is a trace amount of free fluid in the abdomen and pelvis. There is mild body wall edema. Musculoskeletal. There is a lytic lesion in the L1 vertebral body. There are advanced degenerative changes throughout the visualized thoracolumbar spine. There is no displaced fracture. IMPRESSION: 1. No acute  intra-abdominal abnormality detected. 2. Small volume free fluid in the abdomen and pelvis. 3. There is cholelithiasis without secondary signs of acute cholecystitis. 4. Heterogeneous appearance of the osseous structures including a lytic lesion in the L1 vertebral body is consistent with the patient's reported history of multiple myeloma. There is no acute displaced fracture. 5. Cardiomegaly with trace bilateral pleural effusions and adjacent atelectasis. Electronically Signed   By: Constance Holster M.D.   On: 05/25/2019 18:37   Dg Chest Port 1 View  Result Date: 05/25/2019 CLINICAL DATA:  Shortness of breath. EXAM: PORTABLE CHEST 1 VIEW COMPARISON:  Mar 01, 2019 FINDINGS: A right I will is catheter is in good position. No pneumothorax. Cardiomegaly. No overt edema. Mild opacity in left base favored represent atelectasis, and perhaps a tiny effusion or pleural thickening, unchanged. IMPRESSION: 1. Cardiomegaly.  Stable pacemaker. 2. The dialysis catheter is in good position.  No pneumothorax. 3. Opacity in the left retrocardiac region is likely pleural thickening versus a small effusion with associated atelectasis, unchanged. Electronically Signed   By: Dorise Bullion III M.D   On: 05/25/2019 16:22    Microbiology: Recent Results (from the past 240 hour(s))  SARS Coronavirus 2 [facility] order, Performed in [facility] hospital lab) Nasopharyngeal Nasopharyngeal Swab     Status: None   Collection Time: 05/25/19  3:50 PM   Specimen: Nasopharyngeal Swab  Result Value Ref Range Status   SARS Coronavirus 2 NEGATIVE NEGATIVE Final    Comment: (NOTE) If result is NEGATIVE SARS-CoV-2 target nucleic acids are NOT DETECTED. The SARS-CoV-2 RNA is generally detectable in upper and lower  respiratory specimens during the acute phase of infection. The lowest  concentration of SARS-CoV-2 viral copies this assay can detect is 250  copies / mL. A negative result does not preclude SARS-CoV-2 infection  and  should not be used as the sole basis for treatment or other  patient management decisions.  A negative result may occur with  improper specimen collection / handling, submission of specimen other  than nasopharyngeal swab, presence of viral mutation(s) within the  areas targeted by this assay, and inadequate number of viral copies  (<250 copies / mL). A negative result must be combined with clinical  observations, patient history, and epidemiological information. If result is POSITIVE SARS-CoV-2 target nucleic acids are DETECTED. The SARS-CoV-2 RNA is generally detectable in upper and lower  respiratory specimens dur ing the acute phase of infection.  Positive  results are indicative of active infection with SARS-CoV-2.  Clinical  correlation with patient history and other diagnostic information is  necessary to determine patient infection status.  Positive results do  not rule out bacterial infection or co-infection with other viruses. If result is PRESUMPTIVE POSTIVE SARS-CoV-2 nucleic acids MAY BE PRESENT.   A presumptive positive  result was obtained on the submitted specimen  and confirmed on repeat testing.  While 2019 novel coronavirus  (SARS-CoV-2) nucleic acids may be present in the submitted sample  additional confirmatory testing may be necessary for epidemiological  and / or clinical management purposes  to differentiate between  SARS-CoV-2 and other Sarbecovirus currently known to infect humans.  If clinically indicated additional testing with an alternate test  methodology [phone]) is advised. The SARS-CoV-2 RNA is generally  detectable in upper and lower respiratory sp ecimens during the acute  phase of infection. The expected result is Negative. Fact Sheet for Patients:  StrictlyIdeas.no Fact Sheet for Healthcare Providers: BankingDealers.co.za This test is not yet approved or cleared by the [place] FDA and has been  authorized for detection and/or diagnosis of SARS-CoV-2 by FDA under an Emergency Use Authorization (EUA).  This EUA will remain in effect (meaning this test can be used) for the duration of the COVID-19 declaration under Section 564(b)(1) of the Act, 21 U.S.C. section 360bbb-3(b)(1), unless the authorization is terminated or revoked sooner. Performed at [facility], [place] [address] [place], [place] [address]   MRSA PCR Screening     Status: None   Collection Time: 05/25/19 10:31 PM   Specimen: Nasopharyngeal  Result Value Ref Range Status   MRSA by PCR NEGATIVE NEGATIVE Final    Comment:        The GeneXpert MRSA Assay (FDA approved for NASAL specimens only), is one component of a comprehensive MRSA colonization surveillance program. It is not intended to diagnose MRSA infection nor to guide or monitor treatment for MRSA infections. Performed at [facility], [place] [address], [place], [place] [address]      Labs: Basic Metabolic Panel: Recent Labs  Lab 05/23/19 0811 05/25/19 1440 05/25/19 2211 05/26/19 0554  NA 135 134*  --  132*  K 3.6 3.9  --  3.8  CL 98 97*  --  99  CO2 26 26  --  24  GLUCOSE 83 79  --  66*  BUN <4* <5*  --  5*  CREATININE 2.35* 2.72* 3.14* 3.57*  CALCIUM 7.9* 8.2*  --  7.7*  MG  --   --  1.7  --   PHOS  --   --  1.5*  --    Liver Function Tests: Recent Labs  Lab 05/23/19 0811 05/25/19 1440  AST 21 20  ALT 9 14  ALKPHOS 48 48  BILITOT 0.5 0.6  PROT 10.3* 10.7*  ALBUMIN 1.7* 1.9*   Recent Labs  Lab 05/25/19 1440  LIPASE 23   CBC: Recent Labs  Lab 05/23/19 0811 05/25/19 1440 05/25/19 2211 05/26/19 0554  WBC 3.9* 3.5* 3.5* 3.0*  NEUTROABS 2.2  --   --   --   HGB 7.9* 9.1* 8.0* 7.4*  HCT 24.6* 28.9* 24.9* 23.5*  MCV 93.5 95.4 95.4 97.9  PLT 147* 134* 141* 104*   CBG: Recent Labs  Lab 05/25/19 2246 05/26/19 0031 05/26/19 0730 05/26/19 0804 05/26/19 1108  GLUCAP 64* 79 64* 88 71      Active Problems:   Abdominal pain   Pressure injury of skin   Time coordinating discharge: 35 minutes  Signed:  Murray Hodgkins, MD  [facility]  05/26/2019, 3:38 PM

## 2019-05-27 LAB — MULTIPLE MYELOMA PANEL, SERUM
Albumin SerPl Elph-Mcnc: 2.7 g/dL — ABNORMAL LOW (ref 2.9–4.4)
Albumin/Glob SerPl: 0.4 — ABNORMAL LOW (ref 0.7–1.7)
Alpha 1: 0.2 g/dL (ref 0.0–0.4)
Alpha2 Glob SerPl Elph-Mcnc: 0.6 g/dL (ref 0.4–1.0)
B-Globulin SerPl Elph-Mcnc: 5.9 g/dL — ABNORMAL HIGH (ref 0.7–1.3)
Gamma Glob SerPl Elph-Mcnc: 0.3 g/dL — ABNORMAL LOW (ref 0.4–1.8)
Globulin, Total: 7 g/dL — ABNORMAL HIGH (ref 2.2–3.9)
IgA: 16 mg/dL — ABNORMAL LOW (ref 64–422)
IgG (Immunoglobin G), Serum: 6228 mg/dL — ABNORMAL HIGH (ref 586–1602)
IgM (Immunoglobulin M), Srm: 9 mg/dL — ABNORMAL LOW (ref 26–217)
M Protein SerPl Elph-Mcnc: 5.5 g/dL — ABNORMAL HIGH
Total Protein ELP: 9.7 g/dL — ABNORMAL HIGH (ref 6.0–8.5)

## 2019-05-29 ENCOUNTER — Telehealth: Payer: Self-pay

## 2019-05-29 NOTE — Telephone Encounter (Signed)
Spoke with pt by phone and informed of added appt to see Dr Alvy Bimler tomorrow.

## 2019-05-30 ENCOUNTER — Inpatient Hospital Stay: Payer: Medicare HMO

## 2019-05-30 ENCOUNTER — Other Ambulatory Visit: Payer: Self-pay

## 2019-05-30 ENCOUNTER — Encounter: Payer: Self-pay | Admitting: Hematology and Oncology

## 2019-05-30 ENCOUNTER — Inpatient Hospital Stay (HOSPITAL_BASED_OUTPATIENT_CLINIC_OR_DEPARTMENT_OTHER): Payer: Medicare HMO | Admitting: Hematology and Oncology

## 2019-05-30 DIAGNOSIS — Z7189 Other specified counseling: Secondary | ICD-10-CM | POA: Diagnosis not present

## 2019-05-30 DIAGNOSIS — R634 Abnormal weight loss: Secondary | ICD-10-CM | POA: Diagnosis not present

## 2019-05-30 DIAGNOSIS — C9000 Multiple myeloma not having achieved remission: Secondary | ICD-10-CM | POA: Diagnosis not present

## 2019-05-30 DIAGNOSIS — E1140 Type 2 diabetes mellitus with diabetic neuropathy, unspecified: Secondary | ICD-10-CM | POA: Diagnosis not present

## 2019-05-30 DIAGNOSIS — N186 End stage renal disease: Secondary | ICD-10-CM | POA: Diagnosis not present

## 2019-05-30 DIAGNOSIS — R630 Anorexia: Secondary | ICD-10-CM | POA: Diagnosis not present

## 2019-05-30 DIAGNOSIS — Z992 Dependence on renal dialysis: Secondary | ICD-10-CM | POA: Diagnosis not present

## 2019-05-30 DIAGNOSIS — K5900 Constipation, unspecified: Secondary | ICD-10-CM | POA: Diagnosis not present

## 2019-05-30 DIAGNOSIS — K5909 Other constipation: Secondary | ICD-10-CM | POA: Diagnosis not present

## 2019-05-30 DIAGNOSIS — R140 Abdominal distension (gaseous): Secondary | ICD-10-CM | POA: Diagnosis not present

## 2019-05-30 DIAGNOSIS — D61818 Other pancytopenia: Secondary | ICD-10-CM | POA: Diagnosis not present

## 2019-05-30 DIAGNOSIS — J90 Pleural effusion, not elsewhere classified: Secondary | ICD-10-CM | POA: Diagnosis not present

## 2019-05-30 DIAGNOSIS — K8020 Calculus of gallbladder without cholecystitis without obstruction: Secondary | ICD-10-CM | POA: Diagnosis not present

## 2019-05-30 DIAGNOSIS — Z5111 Encounter for antineoplastic chemotherapy: Secondary | ICD-10-CM | POA: Diagnosis not present

## 2019-05-30 LAB — CBC WITH DIFFERENTIAL/PLATELET
Abs Immature Granulocytes: 0 10*3/uL (ref 0.00–0.07)
Basophils Absolute: 0 10*3/uL (ref 0.0–0.1)
Basophils Relative: 0 %
Eosinophils Absolute: 0 10*3/uL (ref 0.0–0.5)
Eosinophils Relative: 1 %
HCT: 23.8 % — ABNORMAL LOW (ref 36.0–46.0)
Hemoglobin: 7.8 g/dL — ABNORMAL LOW (ref 12.0–15.0)
Immature Granulocytes: 0 %
Lymphocytes Relative: 39 %
Lymphs Abs: 1.2 10*3/uL (ref 0.7–4.0)
MCH: 29.9 pg (ref 26.0–34.0)
MCHC: 32.8 g/dL (ref 30.0–36.0)
MCV: 91.2 fL (ref 80.0–100.0)
Monocytes Absolute: 0.3 10*3/uL (ref 0.1–1.0)
Monocytes Relative: 10 %
Neutro Abs: 1.5 10*3/uL — ABNORMAL LOW (ref 1.7–7.7)
Neutrophils Relative %: 50 %
Platelets: 135 10*3/uL — ABNORMAL LOW (ref 150–400)
RBC: 2.61 MIL/uL — ABNORMAL LOW (ref 3.87–5.11)
RDW: 20.9 % — ABNORMAL HIGH (ref 11.5–15.5)
WBC: 3 10*3/uL — ABNORMAL LOW (ref 4.0–10.5)
nRBC: 0 % (ref 0.0–0.2)

## 2019-05-30 LAB — COMPREHENSIVE METABOLIC PANEL
ALT: 8 U/L (ref 0–44)
AST: 25 U/L (ref 15–41)
Albumin: 1.6 g/dL — ABNORMAL LOW (ref 3.5–5.0)
Alkaline Phosphatase: 48 U/L (ref 38–126)
Anion gap: 12 (ref 5–15)
BUN: <4 mg/dL — ABNORMAL LOW (ref 8–23)
CO2: 27 mmol/L (ref 22–32)
Calcium: 7.5 mg/dL — ABNORMAL LOW (ref 8.9–10.3)
Chloride: 97 mmol/L — ABNORMAL LOW (ref 98–111)
Creatinine, Ser: 2.36 mg/dL — ABNORMAL HIGH (ref 0.44–1.00)
GFR calc Af Amer: 21 mL/min — ABNORMAL LOW (ref >60)
GFR calc non Af Amer: 18 mL/min — ABNORMAL LOW (ref >60)
Glucose, Bld: 76 mg/dL (ref 70–99)
Potassium: 3.7 mmol/L (ref 3.5–5.1)
Sodium: 136 mmol/L (ref 135–145)
Total Bilirubin: 0.6 mg/dL (ref 0.3–1.2)
Total Protein: 9.9 g/dL — ABNORMAL HIGH (ref 6.5–8.1)

## 2019-05-30 LAB — SAMPLE TO BLOOD BANK

## 2019-05-30 MED ORDER — BORTEZOMIB CHEMO SQ INJECTION 3.5 MG (2.5MG/ML)
1.0400 mg/m2 | Freq: Once | INTRAMUSCULAR | Status: AC
  Administered 2019-05-30: 2 mg via SUBCUTANEOUS
  Filled 2019-05-30: qty 0.8

## 2019-05-30 MED ORDER — PROCHLORPERAZINE MALEATE 10 MG PO TABS
ORAL_TABLET | ORAL | Status: AC
  Filled 2019-05-30: qty 1

## 2019-05-30 MED ORDER — PROCHLORPERAZINE MALEATE 10 MG PO TABS: 10.0000 mg | ORAL_TABLET | Freq: Once | ORAL | Status: DC

## 2019-05-30 NOTE — Assessment & Plan Note (Signed)
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 I will defer to her nephrologist for further management 

## 2019-05-30 NOTE — Assessment & Plan Note (Signed)
We had numerous goals of care discussion in the past The patient wants to continue on treatment We discussed prognosis with or without treatment

## 2019-05-30 NOTE — Patient Instructions (Signed)
[facility] Discharge Instructions for Patients Receiving Chemotherapy  Today you received the following chemotherapy agents Velcade.  To help prevent nausea and vomiting after your treatment, we encourage you to take your nausea medication as directed.  If you develop nausea and vomiting that is not controlled by your nausea medication, call the clinic.   BELOW ARE SYMPTOMS THAT SHOULD BE REPORTED IMMEDIATELY:  *FEVER GREATER THAN 100.5 F  *CHILLS WITH OR WITHOUT FEVER  NAUSEA AND VOMITING THAT IS NOT CONTROLLED WITH YOUR NAUSEA MEDICATION  *UNUSUAL SHORTNESS OF BREATH  *UNUSUAL BRUISING OR BLEEDING  TENDERNESS IN MOUTH AND THROAT WITH OR WITHOUT PRESENCE OF ULCERS  *URINARY PROBLEMS  *BOWEL PROBLEMS  UNUSUAL RASH Items with * indicate a potential emergency and should be followed up as soon as possible.  Feel free to call the clinic should you have any questions or concerns. The clinic phone number is [phone].  Please show the CHEMO ALERT CARD at check-in to the Emergency Department and triage nurse.   

## 2019-05-30 NOTE — Progress Notes (Signed)
Per Dr. Alvy Bimler ok to treat after labs reviewed today.

## 2019-05-30 NOTE — Assessment & Plan Note (Addendum)
I had discussed the case with her nephrologist She will continue hemodialysis on Tuesdays, Thursdays and Saturdays as scheduled We discussed potential port placement for more aggressive chemotherapy but due to her plan for long-term dialysis, at this point, we will have to hold off port placement

## 2019-05-30 NOTE — Assessment & Plan Note (Signed)
She continues to have significant constipation and abdominal discomfort Recent CT imaging did not show evidence of bowel obstruction She is currently scheduled to see gastroenterologist for further management

## 2019-05-30 NOTE — Progress Notes (Signed)
[place] OFFICE PROGRESS NOTE  Patient Care Team: Lucianne Lei, MD as PCP - General (Family Medicine)  ASSESSMENT & PLAN:  Multiple myeloma not having achieved remission ([place]) Overall, the patient is miserable with multiple complaints including bloating, difficulties with bowel movement and abdominal discomfort She has lost some weight She had recent hospitalization with CT imaging which showed no evidence of bowel obstruction She has been referred to GI service I told her that her symptoms are unrelated to current treatment or her underlying bone marrow disorder  Based on her recent myeloma panel, the patient only had partial response Due to her age, significant comorbidities including diabetes with neuropathy, heart disease, end-stage renal disease and frail status, poor baseline performance, treatment options are limited. Subcutaneous daratumumab has just been approved; it will take me several weeks to get that set up Other oral agents could be associated with thrombotic events such as lenalidomide and pomalidomide I discussed with her nephrologist about adding intravenous daratumumab but due to venous access, that is not possible Ultimately, the patient wants to continue treatment I recommend consideration to increase her dexamethasone to daily I warned her about risk of fluid retention, uncontrolled diabetes and other side effects with daily dexamethasone and she appears not enthusiastic We will proceed with treatment as scheduled I will keep her informed and updated once I can get subcutaneous daratumumab approved This plan is also reviewed with her daughter  Pancytopenia, acquired [facility]) 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 I will defer to her nephrologist for further management  ESRD (end stage renal disease) on dialysis [facility]) I had discussed the case with her nephrologist She will continue hemodialysis on Tuesdays, Thursdays and Saturdays as  scheduled We discussed potential port placement for more aggressive chemotherapy but due to her plan for long-term dialysis, at this point, we will have to hold off port placement   Other constipation She continues to have significant constipation and abdominal discomfort Recent CT imaging did not show evidence of bowel obstruction She is currently scheduled to see gastroenterologist for further management  Goals of care, counseling/discussion We had numerous goals of care discussion in the past The patient wants to continue on treatment We discussed prognosis with or without treatment   No orders of the defined types were placed in this encounter.   INTERVAL HISTORY: Please see below for problem oriented charting. She returns for further follow-up due to recent hospitalization I have also reviewed the plan of care with her daughter, Remo Lipps as well as her nephrologist, Dr. Lorrene Reid She was recently hospitalized with abdominal discomfort CT scan rule out bowel obstruction She continues to have nausea, poor appetite as well as abdominal discomfort as well as constipation She is weak She denies worsening peripheral neuropathy No recent infection, fever or chills The patient denies any recent signs or symptoms of bleeding such as spontaneous epistaxis, hematuria or hematochezia.  SUMMARY OF ONCOLOGIC HISTORY: Oncology History  Multiple myeloma not having achieved remission ([place])  03/03/2019 Initial Diagnosis   Multiple myeloma not having achieved remission ([place])   03/03/2019 Imaging   1. Round and oval lucent/lytic lesions in the skull, bilateral humeri, distal left femur, and possibly also in the pelvis are compatible with multiple myeloma. No right lower extremity lytic lesion identified. No pathologic fracture. 2. Advanced degenerative changes in the spine with no vertebral myeloma evident by plain radiography. 3. Advanced degenerative changes in the right upper extremity. Bilateral  knee arthroplasty. 4.  Aortic Atherosclerosis (ICD10-I70.0).  03/04/2019 -  Chemotherapy   The patient had bortezomib for chemotherapy treatment.    03/04/2019 Bone Marrow Biopsy   Bone Marrow, Aspirate,Biopsy, and Clot - HYPERCELLULAR BONE MARROW FOR AGE WITH PLASMACYTOSIS. - SEE COMMENT. PERIPHERAL BLOOD: - NORMOCYTIC-NORMOCHROMIC ANEMIA. Diagnosis Note The bone marrow is hypercellular for age with increased number of atypical plasma cells averaging 30% of all cells in the aspirate associated with interstitial infiltrates and numerous variably sized aggregates in the clot and biopsy sections. 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 Confirmatory in situ hybridization for kappa and lambda as well as CD138 will be performed and the results reported in an addendum. The background shows trilineage hematopoiesis with mild non-specific changes primarily involving the erythroid series. Correlation with cytogenetic and FISH studies is recommended.      REVIEW OF SYSTEMS:   Constitutional: Denies fevers, chills or abnormal weight loss Eyes: Denies blurriness of vision Ears, nose, mouth, throat, and face: Denies mucositis or sore throat Respiratory: Denies cough, dyspnea or wheezes Cardiovascular: Denies palpitation, chest discomfort or lower extremity swelling Gastrointestinal:  Denies nausea, heartburn or change in bowel habits Skin: Denies abnormal skin rashes Lymphatics: Denies new lymphadenopathy or easy bruising Neurological:Denies numbness, tingling or new weaknesses Behavioral/Psych: Mood is stable, no new changes  All other systems were reviewed with the patient and are negative.  I have reviewed the past medical history, past surgical history, social history and family history with the patient and they are unchanged from previous note.  ALLERGIES:  is allergic to aspirin; hydrocodone-acetaminophen; morphine; penicillins; brimonidine; diltiazem hcl;  hydrocodone-acetaminophen; and neurontin [gabapentin].  MEDICATIONS:  Current Outpatient Medications  Medication Sig Dispense Refill  . acetaminophen (TYLENOL) 500 MG tablet Take 500 mg by mouth 2 (two) times daily as needed for moderate pain.    Marland Kitchen acyclovir (ZOVIRAX) 400 MG tablet Take 1 tablet (400 mg total) by mouth daily. 30 tablet 6  . allopurinol (ZYLOPRIM) 100 MG tablet Take 0.5 tablets (50 mg total) by mouth daily. 30 tablet 0  . B Complex-C-Zn-Folic Acid (DIALYVITE 122-QMGN 15) 0.8 MG TABS Take 1 tablet by mouth daily.    . Coenzyme Q10 (CO Q 10 PO) Take 1 tablet by mouth daily.     Marland Kitchen dexamethasone (DECADRON) 4 MG tablet Take 1 tablet (4 mg total) by mouth 2 (two) times a week.    . dorzolamide (TRUSOPT) 2 % ophthalmic solution Place 1 drop into the left eye every evening.   1  . fluorometholone (FML) 0.1 % ophthalmic suspension Place 1 drop into the right eye 3 (three) times daily.     . isosorbide-hydrALAZINE (BIDIL) 20-37.5 MG tablet Take 1 tablet by mouth 2 (two) times a day. 60 tablet 0  . lactulose (CHRONULAC) 10 GM/15ML solution TAKE 15 MLS (10 G TOTAL) BY MOUTH 2 (TWO) TIMES DAILY. 473 mL 0  . LUMIGAN 0.01 % SOLN Place 1 drop into the left eye every evening.     Marland Kitchen LYRICA 50 MG capsule Take 50 mg by mouth at bedtime.     . Multiple Vitamin (MULTIVITAMIN WITH MINERALS) TABS tablet Take 1 tablet by mouth daily.    Marland Kitchen nystatin (MYCOSTATIN/NYSTOP) powder Apply topically 4 (four) times daily. 15 g 0  . ondansetron (ZOFRAN) 4 MG tablet Take 1 tablet (4 mg total) by mouth every 8 (eight) hours as needed for nausea. 30 tablet 1  . pantoprazole (PROTONIX) 40 MG tablet Take 1 tablet (40 mg total) by mouth daily. 30 tablet 1  .  prochlorperazine (COMPAZINE) 5 MG tablet Take 1 tablet (5 mg total) by mouth every 6 (six) hours as needed for nausea or vomiting. 30 tablet 1  . senna-docusate (SENOKOT-S) 8.6-50 MG tablet Take 2 tablets by mouth 2 (two) times daily. 30 tablet 0  . sevelamer  carbonate (RENVELA) 2.4 g PACK Take 2.4 g by mouth 3 (three) times daily with meals. 90 each 0  . sevelamer carbonate (RENVELA) 800 MG tablet Take 800 mg by mouth See admin instructions. Take 3 tablets by mouth three times a day with meals and 1 tablet by mouth twice a day with snacks    . simethicone (MYLICON) 80 MG chewable tablet Chew 80 mg by mouth every 6 (six) hours as needed for flatulence.    . sodium chloride (OCEAN) 0.65 % SOLN nasal spray Place 1 spray into both nostrils as needed for congestion.     No current facility-administered medications for this visit.     PHYSICAL EXAMINATION: ECOG PERFORMANCE STATUS: 2 - Symptomatic, <50% confined to bed  Vitals:   05/30/19 0841  BP: (!) 154/47  Pulse: 73  Resp: 18  Temp: 98 F (36.7 C)  SpO2: 100%   Filed Weights   05/30/19 0841  Weight: 154 lb 12.8 oz (70.2 kg)    GENERAL:alert, no distress and comfortable.  She looks thinner SKIN: skin color, texture, turgor are normal, no rashes or significant lesions EYES: normal, Conjunctiva are pink and non-injected, sclera clear OROPHARYNX:no exudate, no erythema and lips, buccal mucosa, and tongue normal  NECK: supple, thyroid normal size, non-tender, without nodularity LYMPH:  no palpable lymphadenopathy in the cervical, axillary or inguinal LUNGS: clear to auscultation and percussion with normal breathing effort HEART: regular rate & rhythm and no murmurs and no lower extremity edema ABDOMEN:abdomen soft, non-tender and normal bowel sounds Musculoskeletal:no cyanosis of digits and no clubbing  NEURO: alert & oriented x 3 with fluent speech, no focal motor/sensory deficits  LABORATORY DATA:  I have reviewed the data as listed    Component Value Date/Time   NA 136 05/30/2019 0808   K 3.7 05/30/2019 0808   CL 97 (L) 05/30/2019 0808   CO2 27 05/30/2019 0808   GLUCOSE 76 05/30/2019 0808   BUN <4 (L) 05/30/2019 0808   CREATININE 2.36 (H) 05/30/2019 0808   CREATININE 1.03 (H)  05/11/2016 0931   CALCIUM 7.5 (L) 05/30/2019 0808   PROT 9.9 (H) 05/30/2019 0808   ALBUMIN 1.6 (L) 05/30/2019 0808   AST 25 05/30/2019 0808   ALT 8 05/30/2019 0808   ALKPHOS 48 05/30/2019 0808   BILITOT 0.6 05/30/2019 0808   GFRNONAA 18 (L) 05/30/2019 0808   GFRAA 21 (L) 05/30/2019 0808    No results found for: SPEP, UPEP  Lab Results  Component Value Date   WBC 3.0 (L) 05/30/2019   NEUTROABS 1.5 (L) 05/30/2019   HGB 7.8 (L) 05/30/2019   HCT 23.8 (L) 05/30/2019   MCV 91.2 05/30/2019   PLT 135 (L) 05/30/2019      Chemistry      Component Value Date/Time   NA 136 05/30/2019 0808   K 3.7 05/30/2019 0808   CL 97 (L) 05/30/2019 0808   CO2 27 05/30/2019 0808   BUN <4 (L) 05/30/2019 0808   CREATININE 2.36 (H) 05/30/2019 0808   CREATININE 1.03 (H) 05/11/2016 0931      Component Value Date/Time   CALCIUM 7.5 (L) 05/30/2019 0808   ALKPHOS 48 05/30/2019 0808   AST 25 05/30/2019 0109  ALT 8 05/30/2019 0808   BILITOT 0.6 05/30/2019 2197       RADIOGRAPHIC STUDIES: I have personally reviewed the radiological images as listed and agreed with the findings in the report. Ct Abdomen Pelvis W Contrast  Result Date: 05/25/2019 CLINICAL DATA:  Abdominal distension. EXAM: CT ABDOMEN AND PELVIS WITH CONTRAST TECHNIQUE: Multidetector CT imaging of the abdomen and pelvis was performed using the standard protocol following bolus administration of intravenous contrast. CONTRAST:  <See Chart> OMNIPAQUE IOHEXOL 300 MG/ML  SOLN COMPARISON:  None. FINDINGS: Lower chest: There is a trace right-sided pleural effusion. There is a trace left-sided pleural effusion. There is atelectasis at the lung bases.The heart size is significantly enlarged. Coronary and aortic calcifications are noted. Hepatobiliary: The liver is normal. Cholelithiasis without acute inflammation.The common bile duct is mildly dilated but tapers normally towards the head of the pancreas. Pancreas: Normal contours without ductal  dilatation. No peripancreatic fluid collection. Spleen: No splenic laceration or hematoma. Adrenals/Urinary Tract: --Adrenal glands: No adrenal hemorrhage. --Right kidney/ureter: No hydronephrosis or perinephric hematoma. --Left kidney/ureter: No hydronephrosis or perinephric hematoma. --Urinary bladder: Unremarkable. Stomach/Bowel: --Stomach/Duodenum: No hiatal hernia or other gastric abnormality. Normal duodenal course and caliber. --Small bowel: No dilatation or inflammation. --Colon: No focal abnormality. --Appendix: Not visualized. No right lower quadrant inflammation or free fluid. Vascular/Lymphatic: Atherosclerotic calcification is present within the non-aneurysmal abdominal aorta, without hemodynamically significant stenosis. --No retroperitoneal lymphadenopathy. --No mesenteric lymphadenopathy. --No pelvic or inguinal lymphadenopathy. Reproductive: Status post hysterectomy. No adnexal mass. Other: There is a trace amount of free fluid in the abdomen and pelvis. There is mild body wall edema. Musculoskeletal. There is a lytic lesion in the L1 vertebral body. There are advanced degenerative changes throughout the visualized thoracolumbar spine. There is no displaced fracture. IMPRESSION: 1. No acute intra-abdominal abnormality detected. 2. Small volume free fluid in the abdomen and pelvis. 3. There is cholelithiasis without secondary signs of acute cholecystitis. 4. Heterogeneous appearance of the osseous structures including a lytic lesion in the L1 vertebral body is consistent with the patient's reported history of multiple myeloma. There is no acute displaced fracture. 5. Cardiomegaly with trace bilateral pleural effusions and adjacent atelectasis. Electronically Signed   By: Constance Holster M.D.   On: 05/25/2019 18:37   Dg Chest Port 1 View  Result Date: 05/25/2019 CLINICAL DATA:  Shortness of breath. EXAM: PORTABLE CHEST 1 VIEW COMPARISON:  Mar 01, 2019 FINDINGS: A right I will is catheter is in  good position. No pneumothorax. Cardiomegaly. No overt edema. Mild opacity in left base favored represent atelectasis, and perhaps a tiny effusion or pleural thickening, unchanged. IMPRESSION: 1. Cardiomegaly.  Stable pacemaker. 2. The dialysis catheter is in good position.  No pneumothorax. 3. Opacity in the left retrocardiac region is likely pleural thickening versus a small effusion with associated atelectasis, unchanged. Electronically Signed   By: Dorise Bullion III M.D   On: 05/25/2019 16:22    All questions were answered. The patient knows to call the clinic with any problems, questions or concerns. No barriers to learning was detected.  I spent 30 minutes counseling the patient face to face. The total time spent in the appointment was 40 minutes and more than 50% was on counseling and review of test results  Heath Lark, MD 05/30/2019 3:13 PM

## 2019-05-30 NOTE — Progress Notes (Signed)
Pt refused compazine prior to Velcade treatment. Pt c/o "bloating" and is afraid that pill will "get stuck". MD informed and aware of issue. Pt to have GI referral.

## 2019-05-30 NOTE — Assessment & Plan Note (Addendum)
Overall, the patient is miserable with multiple complaints including bloating, difficulties with bowel movement and abdominal discomfort She has lost some weight She had recent hospitalization with CT imaging which showed no evidence of bowel obstruction She has been referred to GI service I told her that her symptoms are unrelated to current treatment or her underlying bone marrow disorder  Based on her recent myeloma panel, the patient only had partial response Due to her age, significant comorbidities including diabetes with neuropathy, heart disease, end-stage renal disease and frail status, poor baseline performance, treatment options are limited. Subcutaneous daratumumab has just been approved; it will take me several weeks to get that set up Other oral agents could be associated with thrombotic events such as lenalidomide and pomalidomide I discussed with her nephrologist about adding intravenous daratumumab but due to venous access, that is not possible Ultimately, the patient wants to continue treatment I recommend consideration to increase her dexamethasone to daily I warned her about risk of fluid retention, uncontrolled diabetes and other side effects with daily dexamethasone and she appears not enthusiastic We will proceed with treatment as scheduled I will keep her informed and updated once I can get subcutaneous daratumumab approved This plan is also reviewed with her daughter

## 2019-06-01 ENCOUNTER — Other Ambulatory Visit: Payer: Self-pay | Admitting: Hematology and Oncology

## 2019-06-04 DIAGNOSIS — R69 Illness, unspecified: Secondary | ICD-10-CM | POA: Diagnosis not present

## 2019-06-06 ENCOUNTER — Inpatient Hospital Stay: Payer: Medicare HMO

## 2019-06-06 ENCOUNTER — Ambulatory Visit: Payer: Medicare HMO | Admitting: Hematology and Oncology

## 2019-06-06 ENCOUNTER — Telehealth: Payer: Self-pay | Admitting: *Deleted

## 2019-06-06 ENCOUNTER — Other Ambulatory Visit: Payer: Self-pay

## 2019-06-06 VITALS — BP 155/54 | HR 75 | Temp 98.2°F | Resp 17 | Wt 150.0 lb

## 2019-06-06 DIAGNOSIS — J90 Pleural effusion, not elsewhere classified: Secondary | ICD-10-CM | POA: Diagnosis not present

## 2019-06-06 DIAGNOSIS — R140 Abdominal distension (gaseous): Secondary | ICD-10-CM | POA: Diagnosis not present

## 2019-06-06 DIAGNOSIS — K8020 Calculus of gallbladder without cholecystitis without obstruction: Secondary | ICD-10-CM | POA: Diagnosis not present

## 2019-06-06 DIAGNOSIS — R6881 Early satiety: Secondary | ICD-10-CM | POA: Diagnosis not present

## 2019-06-06 DIAGNOSIS — K5909 Other constipation: Secondary | ICD-10-CM | POA: Diagnosis not present

## 2019-06-06 DIAGNOSIS — Z5111 Encounter for antineoplastic chemotherapy: Secondary | ICD-10-CM | POA: Diagnosis not present

## 2019-06-06 DIAGNOSIS — R630 Anorexia: Secondary | ICD-10-CM | POA: Diagnosis not present

## 2019-06-06 DIAGNOSIS — D61818 Other pancytopenia: Secondary | ICD-10-CM

## 2019-06-06 DIAGNOSIS — C9000 Multiple myeloma not having achieved remission: Secondary | ICD-10-CM | POA: Diagnosis not present

## 2019-06-06 DIAGNOSIS — K5901 Slow transit constipation: Secondary | ICD-10-CM | POA: Diagnosis not present

## 2019-06-06 DIAGNOSIS — R634 Abnormal weight loss: Secondary | ICD-10-CM | POA: Diagnosis not present

## 2019-06-06 DIAGNOSIS — K5900 Constipation, unspecified: Secondary | ICD-10-CM | POA: Diagnosis not present

## 2019-06-06 DIAGNOSIS — E1140 Type 2 diabetes mellitus with diabetic neuropathy, unspecified: Secondary | ICD-10-CM | POA: Diagnosis not present

## 2019-06-06 LAB — CBC WITH DIFFERENTIAL/PLATELET
Abs Immature Granulocytes: 0.01 10*3/uL (ref 0.00–0.07)
Basophils Absolute: 0 10*3/uL (ref 0.0–0.1)
Basophils Relative: 0 %
Eosinophils Absolute: 0 10*3/uL (ref 0.0–0.5)
Eosinophils Relative: 1 %
HCT: 25.2 % — ABNORMAL LOW (ref 36.0–46.0)
Hemoglobin: 8.2 g/dL — ABNORMAL LOW (ref 12.0–15.0)
Immature Granulocytes: 0 %
Lymphocytes Relative: 36 %
Lymphs Abs: 1.2 10*3/uL (ref 0.7–4.0)
MCH: 29.9 pg (ref 26.0–34.0)
MCHC: 32.5 g/dL (ref 30.0–36.0)
MCV: 92 fL (ref 80.0–100.0)
Monocytes Absolute: 0.4 10*3/uL (ref 0.1–1.0)
Monocytes Relative: 12 %
Neutro Abs: 1.6 10*3/uL — ABNORMAL LOW (ref 1.7–7.7)
Neutrophils Relative %: 51 %
Platelets: 149 10*3/uL — ABNORMAL LOW (ref 150–400)
RBC: 2.74 MIL/uL — ABNORMAL LOW (ref 3.87–5.11)
RDW: 20.3 % — ABNORMAL HIGH (ref 11.5–15.5)
WBC: 3.2 10*3/uL — ABNORMAL LOW (ref 4.0–10.5)
nRBC: 0 % (ref 0.0–0.2)

## 2019-06-06 LAB — COMPREHENSIVE METABOLIC PANEL
ALT: 9 U/L (ref 0–44)
AST: 19 U/L (ref 15–41)
Albumin: 1.6 g/dL — ABNORMAL LOW (ref 3.5–5.0)
Alkaline Phosphatase: 48 U/L (ref 38–126)
Anion gap: 11 (ref 5–15)
BUN: 4 mg/dL — ABNORMAL LOW (ref 8–23)
CO2: 30 mmol/L (ref 22–32)
Calcium: 7.5 mg/dL — ABNORMAL LOW (ref 8.9–10.3)
Chloride: 95 mmol/L — ABNORMAL LOW (ref 98–111)
Creatinine, Ser: 3.25 mg/dL (ref 0.44–1.00)
GFR calc Af Amer: 14 mL/min — ABNORMAL LOW (ref >60)
GFR calc non Af Amer: 12 mL/min — ABNORMAL LOW (ref >60)
Glucose, Bld: 89 mg/dL (ref 70–99)
Potassium: 3.6 mmol/L (ref 3.5–5.1)
Sodium: 136 mmol/L (ref 135–145)
Total Bilirubin: 0.4 mg/dL (ref 0.3–1.2)
Total Protein: 10.3 g/dL — ABNORMAL HIGH (ref 6.5–8.1)

## 2019-06-06 LAB — SAMPLE TO BLOOD BANK

## 2019-06-06 MED ORDER — PROCHLORPERAZINE MALEATE 10 MG PO TABS
10.0000 mg | ORAL_TABLET | Freq: Once | ORAL | Status: AC
  Administered 2019-06-06: 14:00:00 10 mg via ORAL

## 2019-06-06 MED ORDER — BORTEZOMIB CHEMO SQ INJECTION 3.5 MG (2.5MG/ML)
1.0400 mg/m2 | Freq: Once | INTRAMUSCULAR | Status: AC
  Administered 2019-06-06: 2 mg via SUBCUTANEOUS
  Filled 2019-06-06: qty 0.8

## 2019-06-06 MED ORDER — PROCHLORPERAZINE MALEATE 10 MG PO TABS
ORAL_TABLET | ORAL | Status: AC
  Filled 2019-06-06: qty 1

## 2019-06-06 NOTE — Telephone Encounter (Signed)
Received call report from [facility].  "Today's Creat = 3.25."  Note left for collaborative with results.

## 2019-06-06 NOTE — Patient Instructions (Signed)
Cancer Center Discharge Instructions for Patients Receiving Chemotherapy  Today you received the following chemotherapy agent:  Velcade.  To help prevent nausea and vomiting after your treatment, we encourage you to take your nausea medication as directed.   If you develop nausea and vomiting that is not controlled by your nausea medication, call the clinic.   BELOW ARE SYMPTOMS THAT SHOULD BE REPORTED IMMEDIATELY:  *FEVER GREATER THAN 100.5 F  *CHILLS WITH OR WITHOUT FEVER  NAUSEA AND VOMITING THAT IS NOT CONTROLLED WITH YOUR NAUSEA MEDICATION  *UNUSUAL SHORTNESS OF BREATH  *UNUSUAL BRUISING OR BLEEDING  TENDERNESS IN MOUTH AND THROAT WITH OR WITHOUT PRESENCE OF ULCERS  *URINARY PROBLEMS  *BOWEL PROBLEMS  UNUSUAL RASH Items with * indicate a potential emergency and should be followed up as soon as possible.  Feel free to call the clinic should you have any questions or concerns. The clinic phone number is [phone].  Please show the CHEMO ALERT CARD at check-in to the Emergency Department and triage nurse.   

## 2019-06-10 ENCOUNTER — Other Ambulatory Visit: Payer: Self-pay | Admitting: Gastroenterology

## 2019-06-10 ENCOUNTER — Other Ambulatory Visit: Payer: Self-pay | Admitting: Hematology and Oncology

## 2019-06-10 DIAGNOSIS — E0822 Diabetes mellitus due to underlying condition with diabetic chronic kidney disease: Secondary | ICD-10-CM | POA: Diagnosis not present

## 2019-06-10 DIAGNOSIS — D689 Coagulation defect, unspecified: Secondary | ICD-10-CM | POA: Diagnosis not present

## 2019-06-10 DIAGNOSIS — C9000 Multiple myeloma not having achieved remission: Secondary | ICD-10-CM

## 2019-06-10 DIAGNOSIS — E876 Hypokalemia: Secondary | ICD-10-CM | POA: Diagnosis not present

## 2019-06-10 DIAGNOSIS — N186 End stage renal disease: Secondary | ICD-10-CM | POA: Diagnosis not present

## 2019-06-10 DIAGNOSIS — D631 Anemia in chronic kidney disease: Secondary | ICD-10-CM | POA: Diagnosis not present

## 2019-06-10 DIAGNOSIS — Z23 Encounter for immunization: Secondary | ICD-10-CM | POA: Diagnosis not present

## 2019-06-10 DIAGNOSIS — Z7189 Other specified counseling: Secondary | ICD-10-CM

## 2019-06-10 DIAGNOSIS — Z992 Dependence on renal dialysis: Secondary | ICD-10-CM | POA: Diagnosis not present

## 2019-06-10 DIAGNOSIS — N2581 Secondary hyperparathyroidism of renal origin: Secondary | ICD-10-CM | POA: Diagnosis not present

## 2019-06-10 NOTE — Progress Notes (Signed)
START OFF PATHWAY REGIMEN - Multiple Myeloma and Other Plasma Cell Dyscrasias   [id number]:Daratumumab Monotherapy 16 mg/kg IV q28 Days:   Cycles 1 and 2: A cycle is every 28 days:     Daratumumab    Cycles 3 through 6: A cycle is every 28 days:     Daratumumab    Cycles 7 and beyond: A cycle is every 28 days:     Daratumumab   **Always confirm dose/schedule in your pharmacy ordering system**  Administration Notes: SQ Daratumumab due to no IV access  Patient Characteristics: Newly Diagnosed, Transplant Ineligible or Refused, High Risk R-ISS Staging: III Disease Classification: Newly Diagnosed Is Patient Eligible for Transplant<= Transplant Ineligible or Refused Risk Status: High Risk Intent of Therapy: Non-Curative / Palliative Intent, Discussed with Patient

## 2019-06-12 ENCOUNTER — Other Ambulatory Visit: Payer: Self-pay | Admitting: Hematology and Oncology

## 2019-06-12 ENCOUNTER — Telehealth: Payer: Self-pay | Admitting: Hematology and Oncology

## 2019-06-12 ENCOUNTER — Telehealth: Payer: Self-pay

## 2019-06-12 NOTE — Telephone Encounter (Signed)
Scheduled appt per 8/27 sch message- pt to get an updated schedule when she comes in for 8/28 appt

## 2019-06-12 NOTE — Telephone Encounter (Signed)
Called and left a message asking son to call the office back.

## 2019-06-12 NOTE — Telephone Encounter (Signed)
Called back and given below message to son and patient on speaker phone. They both verbalized understanding. Appt scheduled for Monday at 1230.

## 2019-06-12 NOTE — Telephone Encounter (Signed)
-----   Message from Heath Lark, MD sent at 06/12/2019  8:20 AM EDT ----- Regarding: call son or daughter She has some poor hearing I am trying to get her started on SQ Daratumumab next week  I need to talk to her for consent next week She gets dialysis Tues, Thurs and Saturday. I am not here tomorrow Can she come in Monday or Wed next week to discuss (30 mins appt)? All treatment will still be Friday. I cannot consent her same day treatment on Friday

## 2019-06-13 ENCOUNTER — Other Ambulatory Visit: Payer: Self-pay

## 2019-06-13 ENCOUNTER — Other Ambulatory Visit: Payer: Self-pay | Admitting: *Deleted

## 2019-06-13 ENCOUNTER — Inpatient Hospital Stay: Payer: Medicare HMO

## 2019-06-13 ENCOUNTER — Inpatient Hospital Stay: Payer: Medicare HMO | Admitting: Nutrition

## 2019-06-13 VITALS — BP 140/65 | HR 85 | Temp 98.7°F | Resp 16

## 2019-06-13 DIAGNOSIS — R630 Anorexia: Secondary | ICD-10-CM | POA: Diagnosis not present

## 2019-06-13 DIAGNOSIS — K8020 Calculus of gallbladder without cholecystitis without obstruction: Secondary | ICD-10-CM | POA: Diagnosis not present

## 2019-06-13 DIAGNOSIS — J90 Pleural effusion, not elsewhere classified: Secondary | ICD-10-CM | POA: Diagnosis not present

## 2019-06-13 DIAGNOSIS — E1140 Type 2 diabetes mellitus with diabetic neuropathy, unspecified: Secondary | ICD-10-CM | POA: Diagnosis not present

## 2019-06-13 DIAGNOSIS — Z5111 Encounter for antineoplastic chemotherapy: Secondary | ICD-10-CM | POA: Diagnosis not present

## 2019-06-13 DIAGNOSIS — C9000 Multiple myeloma not having achieved remission: Secondary | ICD-10-CM

## 2019-06-13 DIAGNOSIS — R140 Abdominal distension (gaseous): Secondary | ICD-10-CM | POA: Diagnosis not present

## 2019-06-13 DIAGNOSIS — Z7189 Other specified counseling: Secondary | ICD-10-CM

## 2019-06-13 DIAGNOSIS — D61818 Other pancytopenia: Secondary | ICD-10-CM

## 2019-06-13 DIAGNOSIS — K5909 Other constipation: Secondary | ICD-10-CM | POA: Diagnosis not present

## 2019-06-13 DIAGNOSIS — K5900 Constipation, unspecified: Secondary | ICD-10-CM | POA: Diagnosis not present

## 2019-06-13 DIAGNOSIS — R634 Abnormal weight loss: Secondary | ICD-10-CM | POA: Diagnosis not present

## 2019-06-13 LAB — COMPREHENSIVE METABOLIC PANEL
ALT: 8 U/L (ref 0–44)
AST: 18 U/L (ref 15–41)
Albumin: 1.5 g/dL — ABNORMAL LOW (ref 3.5–5.0)
Alkaline Phosphatase: 43 U/L (ref 38–126)
Anion gap: 11 (ref 5–15)
BUN: 4 mg/dL — ABNORMAL LOW (ref 8–23)
CO2: 27 mmol/L (ref 22–32)
Calcium: 7.5 mg/dL — ABNORMAL LOW (ref 8.9–10.3)
Chloride: 98 mmol/L (ref 98–111)
Creatinine, Ser: 2.83 mg/dL — ABNORMAL HIGH (ref 0.44–1.00)
GFR calc Af Amer: 17 mL/min — ABNORMAL LOW (ref >60)
GFR calc non Af Amer: 14 mL/min — ABNORMAL LOW (ref >60)
Glucose, Bld: 86 mg/dL (ref 70–99)
Potassium: 3.3 mmol/L — ABNORMAL LOW (ref 3.5–5.1)
Sodium: 136 mmol/L (ref 135–145)
Total Bilirubin: 0.5 mg/dL (ref 0.3–1.2)
Total Protein: 10 g/dL — ABNORMAL HIGH (ref 6.5–8.1)

## 2019-06-13 LAB — TYPE AND SCREEN
ABO/RH(D): A POS
Antibody Screen: NEGATIVE

## 2019-06-13 LAB — CBC WITH DIFFERENTIAL/PLATELET
Abs Immature Granulocytes: 0.01 10*3/uL (ref 0.00–0.07)
Basophils Absolute: 0 10*3/uL (ref 0.0–0.1)
Basophils Relative: 0 %
Eosinophils Absolute: 0 10*3/uL (ref 0.0–0.5)
Eosinophils Relative: 1 %
HCT: 24.3 % — ABNORMAL LOW (ref 36.0–46.0)
Hemoglobin: 8.1 g/dL — ABNORMAL LOW (ref 12.0–15.0)
Immature Granulocytes: 0 %
Lymphocytes Relative: 40 %
Lymphs Abs: 1.4 10*3/uL (ref 0.7–4.0)
MCH: 30.3 pg (ref 26.0–34.0)
MCHC: 33.3 g/dL (ref 30.0–36.0)
MCV: 91 fL (ref 80.0–100.0)
Monocytes Absolute: 0.4 10*3/uL (ref 0.1–1.0)
Monocytes Relative: 11 %
Neutro Abs: 1.7 10*3/uL (ref 1.7–7.7)
Neutrophils Relative %: 48 %
Platelets: 139 10*3/uL — ABNORMAL LOW (ref 150–400)
RBC: 2.67 MIL/uL — ABNORMAL LOW (ref 3.87–5.11)
RDW: 20.6 % — ABNORMAL HIGH (ref 11.5–15.5)
WBC: 3.5 10*3/uL — ABNORMAL LOW (ref 4.0–10.5)
nRBC: 0 % (ref 0.0–0.2)

## 2019-06-13 MED ORDER — PROCHLORPERAZINE MALEATE 10 MG PO TABS
10.0000 mg | ORAL_TABLET | Freq: Once | ORAL | Status: AC
  Administered 2019-06-13: 10:00:00 10 mg via ORAL

## 2019-06-13 MED ORDER — PROCHLORPERAZINE MALEATE 10 MG PO TABS
ORAL_TABLET | ORAL | Status: AC
  Filled 2019-06-13: qty 1

## 2019-06-13 MED ORDER — BORTEZOMIB CHEMO SQ INJECTION 3.5 MG (2.5MG/ML)
1.0400 mg/m2 | Freq: Once | INTRAMUSCULAR | Status: AC
  Administered 2019-06-13: 2 mg via SUBCUTANEOUS
  Filled 2019-06-13: qty 0.8

## 2019-06-13 NOTE — Patient Instructions (Signed)
[facility] Discharge Instructions for Patients Receiving Chemotherapy  Today you received the following chemotherapy agents Velcade.  To help prevent nausea and vomiting after your treatment, we encourage you to take your nausea medication as directed.  If you develop nausea and vomiting that is not controlled by your nausea medication, call the clinic.   BELOW ARE SYMPTOMS THAT SHOULD BE REPORTED IMMEDIATELY:  *FEVER GREATER THAN 100.5 F  *CHILLS WITH OR WITHOUT FEVER  NAUSEA AND VOMITING THAT IS NOT CONTROLLED WITH YOUR NAUSEA MEDICATION  *UNUSUAL SHORTNESS OF BREATH  *UNUSUAL BRUISING OR BLEEDING  TENDERNESS IN MOUTH AND THROAT WITH OR WITHOUT PRESENCE OF ULCERS  *URINARY PROBLEMS  *BOWEL PROBLEMS  UNUSUAL RASH Items with * indicate a potential emergency and should be followed up as soon as possible.  Feel free to call the clinic should you have any questions or concerns. The clinic phone number is [phone].  Please show the CHEMO ALERT CARD at check-in to the Emergency Department and triage nurse.   

## 2019-06-13 NOTE — Progress Notes (Signed)
85 year old female diagnosed with multiple myeloma.  She is a patient of Dr. Alvy Bimler.  Past medical history includes end-stage renal disease on dialysis Tuesdays, Thursdays, and Saturday, diabetes, hypertension, OSA, SSS.  Medications include renal vitamin, co-Q10, Decadron, Chronulac, Zofran, Protonix, Compazine, Senokot-S, simethicone.  Labs include potassium 3.3, BUN 4, creatinine 2.83, albumin 1.5.  Height: 65 inches. Weight: 150 pounds on August 21. Usual body weight: 190 pounds in April 2019.  179 pounds in April 2020. BMI: 24.96.  Patient was identified to be at risk for malnutrition on the MST secondary to weight loss and poor appetite. Also noted early skin breakdown on sacrum. Patient receives dialysis 3 days a week and often talks to the renal dietitian about nutrition. She reports constipation. She also complains of poor appetite and early satiety. Breakfast consists of cream of wheat with protein powder, lunch is usually soup, and dinner is generally macaroni and cheese with vegetables and rice. Patient does drink Nepro and reports her son blenderizes her food so that it is easier to swallow. Patient understands the need for increased protein.  She would like additional strategies for bowel regimen.  Nutrition diagnosis:  Increased nutrient needs related to multiple myeloma and end-stage renal disease as evidenced by increased needs for healing.  Intervention: Recommended small frequent meals and snacks. Nepro twice daily. Education regarding bowel regimen. Continue follow-up with renal dietitian.  Monitoring, evaluation, goals: Patient will tolerate adequate calories and protein for wound healing.  Patient will follow up with renal dietitian as needed.  **Disclaimer: This note was dictated with voice recognition software. Similar sounding words can inadvertently be transcribed and this note may contain transcription errors which may not have been corrected upon  publication of note.**

## 2019-06-14 LAB — SAMPLE TO BLOOD BANK

## 2019-06-16 ENCOUNTER — Ambulatory Visit: Payer: Medicare HMO | Admitting: Hematology and Oncology

## 2019-06-16 DIAGNOSIS — N186 End stage renal disease: Secondary | ICD-10-CM | POA: Diagnosis not present

## 2019-06-16 DIAGNOSIS — E1122 Type 2 diabetes mellitus with diabetic chronic kidney disease: Secondary | ICD-10-CM | POA: Diagnosis not present

## 2019-06-16 DIAGNOSIS — Z992 Dependence on renal dialysis: Secondary | ICD-10-CM | POA: Diagnosis not present

## 2019-06-16 LAB — PRETREATMENT RBC PHENOTYPE

## 2019-06-17 DIAGNOSIS — Z992 Dependence on renal dialysis: Secondary | ICD-10-CM | POA: Diagnosis not present

## 2019-06-17 DIAGNOSIS — D509 Iron deficiency anemia, unspecified: Secondary | ICD-10-CM | POA: Diagnosis not present

## 2019-06-17 DIAGNOSIS — E876 Hypokalemia: Secondary | ICD-10-CM | POA: Diagnosis not present

## 2019-06-17 DIAGNOSIS — N2581 Secondary hyperparathyroidism of renal origin: Secondary | ICD-10-CM | POA: Diagnosis not present

## 2019-06-17 DIAGNOSIS — E0822 Diabetes mellitus due to underlying condition with diabetic chronic kidney disease: Secondary | ICD-10-CM | POA: Diagnosis not present

## 2019-06-17 DIAGNOSIS — N186 End stage renal disease: Secondary | ICD-10-CM | POA: Diagnosis not present

## 2019-06-17 DIAGNOSIS — D689 Coagulation defect, unspecified: Secondary | ICD-10-CM | POA: Diagnosis not present

## 2019-06-17 DIAGNOSIS — Z23 Encounter for immunization: Secondary | ICD-10-CM | POA: Diagnosis not present

## 2019-06-17 DIAGNOSIS — D631 Anemia in chronic kidney disease: Secondary | ICD-10-CM | POA: Diagnosis not present

## 2019-06-18 ENCOUNTER — Inpatient Hospital Stay: Payer: Medicare HMO | Attending: Hematology and Oncology | Admitting: Hematology and Oncology

## 2019-06-18 ENCOUNTER — Encounter: Payer: Self-pay | Admitting: Hematology and Oncology

## 2019-06-18 ENCOUNTER — Other Ambulatory Visit: Payer: Self-pay

## 2019-06-18 DIAGNOSIS — C9000 Multiple myeloma not having achieved remission: Secondary | ICD-10-CM | POA: Diagnosis not present

## 2019-06-18 DIAGNOSIS — G620 Drug-induced polyneuropathy: Secondary | ICD-10-CM | POA: Insufficient documentation

## 2019-06-18 DIAGNOSIS — Z5111 Encounter for antineoplastic chemotherapy: Secondary | ICD-10-CM | POA: Insufficient documentation

## 2019-06-18 DIAGNOSIS — R634 Abnormal weight loss: Secondary | ICD-10-CM | POA: Insufficient documentation

## 2019-06-18 DIAGNOSIS — G893 Neoplasm related pain (acute) (chronic): Secondary | ICD-10-CM | POA: Diagnosis not present

## 2019-06-18 DIAGNOSIS — I700 Atherosclerosis of aorta: Secondary | ICD-10-CM | POA: Diagnosis not present

## 2019-06-18 DIAGNOSIS — N186 End stage renal disease: Secondary | ICD-10-CM | POA: Insufficient documentation

## 2019-06-18 DIAGNOSIS — K5900 Constipation, unspecified: Secondary | ICD-10-CM | POA: Diagnosis not present

## 2019-06-18 DIAGNOSIS — R197 Diarrhea, unspecified: Secondary | ICD-10-CM | POA: Diagnosis not present

## 2019-06-18 DIAGNOSIS — Z79899 Other long term (current) drug therapy: Secondary | ICD-10-CM | POA: Diagnosis not present

## 2019-06-18 DIAGNOSIS — D61818 Other pancytopenia: Secondary | ICD-10-CM | POA: Insufficient documentation

## 2019-06-18 DIAGNOSIS — I2510 Atherosclerotic heart disease of native coronary artery without angina pectoris: Secondary | ICD-10-CM | POA: Insufficient documentation

## 2019-06-18 DIAGNOSIS — Z7189 Other specified counseling: Secondary | ICD-10-CM

## 2019-06-18 DIAGNOSIS — J90 Pleural effusion, not elsewhere classified: Secondary | ICD-10-CM | POA: Diagnosis not present

## 2019-06-18 DIAGNOSIS — Z992 Dependence on renal dialysis: Secondary | ICD-10-CM | POA: Diagnosis not present

## 2019-06-18 NOTE — Assessment & Plan Note (Signed)
We had numerous goals of care discussion in the past The patient wants to continue on treatment We discussed prognosis with or without treatment She understood treatment goal is palliative

## 2019-06-18 NOTE — Assessment & Plan Note (Signed)
I had discussed the case with her nephrologist She will continue hemodialysis on Tuesdays, Thursdays and Saturdays as scheduled We discussed potential port placement for more aggressive chemotherapy but due to her plan for long-term dialysis, at this point, we will have to hold off port placement Neither daratumumab nor Velcade needs dose adjustment in the setting of renal failure

## 2019-06-18 NOTE — Progress Notes (Signed)
[place] OFFICE PROGRESS NOTE  Patient Care Team: Lucianne Lei, MD as PCP - General (Family Medicine)  ASSESSMENT & PLAN:  Multiple myeloma not having achieved remission ([place]) I have reviewed the plan of care with the patient She has a suboptimal partial response only with weekly Velcade and dexamethasone Other treatment choices are restricted due to her end-stage renal disease, significant background history of cardiovascular disease and others Due to inability to get additional venous access secondary to her ongoing dialysis needs, I recommend subcutaneous daratumumab We discussed the risk, benefits, side effects of additional subcutaneous daratumumab along with weekly Velcade and dexamethasone and she agreed to proceed She is warned about potential risk of infusion reaction I will try to see if we can get her treatment in a bit due to long hours of treatment. The patient is willing to proceed with the plan of care She understood treatment goal is palliative  Cancer associated pain She has minimum bone pain and has not taken any pain medicine recently We will observe carefully  Pancytopenia, acquired [facility]) 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 I will defer to her nephrologist for further management  Goals of care, counseling/discussion We had numerous goals of care discussion in the past The patient wants to continue on treatment We discussed prognosis with or without treatment She understood treatment goal is palliative  ESRD (end stage renal disease) on dialysis [facility]) I had discussed the case with her nephrologist She will continue hemodialysis on Tuesdays, Thursdays and Saturdays as scheduled We discussed potential port placement for more aggressive chemotherapy but due to her plan for long-term dialysis, at this point, we will have to hold off port placement Neither daratumumab nor Velcade needs dose adjustment in the setting of renal  failure   No orders of the defined types were placed in this encounter.   INTERVAL HISTORY: Please see below for problem oriented charting. She returns to discuss plan of care with additional chemotherapy She continues to battle with symptoms of constipation alternate with diarrhea She is scheduled to see GI service for further management Her pain is well controlled Her appetite is fair while on steroids but she has lost some weight The patient denies any recent signs or symptoms of bleeding such as spontaneous epistaxis, hematuria or hematochezia.  SUMMARY OF ONCOLOGIC HISTORY: Oncology History  Multiple myeloma not having achieved remission ([place])  03/03/2019 Initial Diagnosis   Multiple myeloma not having achieved remission ([place])   03/03/2019 Imaging   1. Round and oval lucent/lytic lesions in the skull, bilateral humeri, distal left femur, and possibly also in the pelvis are compatible with multiple myeloma. No right lower extremity lytic lesion identified. No pathologic fracture. 2. Advanced degenerative changes in the spine with no vertebral myeloma evident by plain radiography. 3. Advanced degenerative changes in the right upper extremity. Bilateral knee arthroplasty. 4.  Aortic Atherosclerosis (ICD10-I70.0).   03/04/2019 -  Chemotherapy   The patient had bortezomib for chemotherapy treatment.    03/04/2019 Bone Marrow Biopsy   Bone Marrow, Aspirate,Biopsy, and Clot - HYPERCELLULAR BONE MARROW FOR AGE WITH PLASMACYTOSIS. - SEE COMMENT. PERIPHERAL BLOOD: - NORMOCYTIC-NORMOCHROMIC ANEMIA. Diagnosis Note The bone marrow is hypercellular for age with increased number of atypical plasma cells averaging 30% of all cells in the aspirate associated with interstitial infiltrates and numerous variably sized aggregates in the clot and biopsy sections. 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 Confirmatory in situ hybridization for kappa and lambda as well  as CD138 will be  performed and the results reported in an addendum. The background shows trilineage hematopoiesis with mild non-specific changes primarily involving the erythroid series. Correlation with cytogenetic and FISH studies is recommended.    06/20/2019 -  Chemotherapy   The patient had daratumumab-hyaluronidase-fihj (DARZALEX FASPRO) 1800-30000 MG-UT/15ML chemo SQ injection 1,800 mg, 1,800 mg, Subcutaneous,  Once, 0 of 10 cycles  for chemotherapy treatment.      REVIEW OF SYSTEMS:   Constitutional: Denies fevers, chills  Eyes: Denies blurriness of vision Ears, nose, mouth, throat, and face: Denies mucositis or sore throat Respiratory: Denies cough, dyspnea or wheezes Cardiovascular: Denies palpitation, chest discomfort or lower extremity swelling Gastrointestinal:  Denies nausea, heartburn or change in bowel habits Skin: Denies abnormal skin rashes Lymphatics: Denies new lymphadenopathy or easy bruising Neurological:Denies numbness, tingling or new weaknesses Behavioral/Psych: Mood is stable, no new changes  All other systems were reviewed with the patient and are negative.  I have reviewed the past medical history, past surgical history, social history and family history with the patient and they are unchanged from previous note.  ALLERGIES:  is allergic to aspirin; hydrocodone-acetaminophen; morphine; penicillins; brimonidine; diltiazem hcl; hydrocodone-acetaminophen; and neurontin [gabapentin].  MEDICATIONS:  Current Outpatient Medications  Medication Sig Dispense Refill  . acetaminophen (TYLENOL) 500 MG tablet Take 500 mg by mouth 2 (two) times daily as needed for moderate pain.    Marland Kitchen acyclovir (ZOVIRAX) 400 MG tablet Take 1 tablet (400 mg total) by mouth daily. 30 tablet 6  . allopurinol (ZYLOPRIM) 100 MG tablet Take 0.5 tablets (50 mg total) by mouth daily. 30 tablet 0  . B Complex-C-Zn-Folic Acid (DIALYVITE 001-VCBS 15) 0.8 MG TABS Take 1 tablet by mouth daily.    . Coenzyme Q10 (CO Q 10  PO) Take 1 tablet by mouth daily.     Marland Kitchen dexamethasone (DECADRON) 4 MG tablet TAKE 1 TABLET BY MOUTH EVERY DAY 30 tablet 0  . dorzolamide (TRUSOPT) 2 % ophthalmic solution Place 1 drop into the left eye every evening.   1  . fluorometholone (FML) 0.1 % ophthalmic suspension Place 1 drop into the right eye 3 (three) times daily.     . isosorbide-hydrALAZINE (BIDIL) 20-37.5 MG tablet Take 1 tablet by mouth 2 (two) times a day. 60 tablet 0  . lactulose (CHRONULAC) 10 GM/15ML solution TAKE 15 MLS (10 G TOTAL) BY MOUTH 2 (TWO) TIMES DAILY. 473 mL 0  . LUMIGAN 0.01 % SOLN Place 1 drop into the left eye every evening.     Marland Kitchen LYRICA 50 MG capsule Take 50 mg by mouth at bedtime.     . Multiple Vitamin (MULTIVITAMIN WITH MINERALS) TABS tablet Take 1 tablet by mouth daily.    Marland Kitchen nystatin (MYCOSTATIN/NYSTOP) powder Apply topically 4 (four) times daily. 15 g 0  . ondansetron (ZOFRAN) 4 MG tablet Take 1 tablet (4 mg total) by mouth every 8 (eight) hours as needed for nausea. 30 tablet 1  . pantoprazole (PROTONIX) 40 MG tablet Take 1 tablet (40 mg total) by mouth daily. 30 tablet 1  . prochlorperazine (COMPAZINE) 5 MG tablet Take 1 tablet (5 mg total) by mouth every 6 (six) hours as needed for nausea or vomiting. 30 tablet 1  . senna-docusate (SENOKOT-S) 8.6-50 MG tablet Take 2 tablets by mouth 2 (two) times daily. 30 tablet 0  . sevelamer carbonate (RENVELA) 2.4 g PACK Take 2.4 g by mouth 3 (three) times daily with meals. 90 each 0  . sevelamer carbonate (RENVELA)  800 MG tablet Take 800 mg by mouth See admin instructions. Take 3 tablets by mouth three times a day with meals and 1 tablet by mouth twice a day with snacks    . simethicone (MYLICON) 80 MG chewable tablet Chew 80 mg by mouth every 6 (six) hours as needed for flatulence.    . sodium chloride (OCEAN) 0.65 % SOLN nasal spray Place 1 spray into both nostrils as needed for congestion.     No current facility-administered medications for this visit.      PHYSICAL EXAMINATION: ECOG PERFORMANCE STATUS: 2 - Symptomatic, <50% confined to bed  Vitals:   06/18/19 0819  BP: (!) 136/49  Pulse: 89  Resp: 17  Temp: 98.5 F (36.9 C)  SpO2: 100%   Filed Weights   06/18/19 0819  Weight: 140 lb 3.2 oz (63.6 kg)    GENERAL:alert, no distress and comfortable SKIN: skin color, texture, turgor are normal, no rashes or significant lesions EYES: normal, Conjunctiva are pink and non-injected, sclera clear OROPHARYNX:no exudate, no erythema and lips, buccal mucosa, and tongue normal  NECK: supple, thyroid normal size, non-tender, without nodularity LYMPH:  no palpable lymphadenopathy in the cervical, axillary or inguinal LUNGS: clear to auscultation and percussion with normal breathing effort HEART: regular rate & rhythm and no murmurs and no lower extremity edema ABDOMEN:abdomen soft, non-tender and normal bowel sounds Musculoskeletal:no cyanosis of digits and no clubbing  NEURO: alert & oriented x 3 with fluent speech, no focal motor/sensory deficits  LABORATORY DATA:  I have reviewed the data as listed    Component Value Date/Time   NA 136 06/13/2019 0840   K 3.3 (L) 06/13/2019 0840   CL 98 06/13/2019 0840   CO2 27 06/13/2019 0840   GLUCOSE 86 06/13/2019 0840   BUN 4 (L) 06/13/2019 0840   CREATININE 2.83 (H) 06/13/2019 0840   CREATININE 1.03 (H) 05/11/2016 0931   CALCIUM 7.5 (L) 06/13/2019 0840   PROT 10.0 (H) 06/13/2019 0840   ALBUMIN 1.5 (L) 06/13/2019 0840   AST 18 06/13/2019 0840   ALT 8 06/13/2019 0840   ALKPHOS 43 06/13/2019 0840   BILITOT 0.5 06/13/2019 0840   GFRNONAA 14 (L) 06/13/2019 0840   GFRAA 17 (L) 06/13/2019 0840    No results found for: SPEP, UPEP  Lab Results  Component Value Date   WBC 3.5 (L) 06/13/2019   NEUTROABS 1.7 06/13/2019   HGB 8.1 (L) 06/13/2019   HCT 24.3 (L) 06/13/2019   MCV 91.0 06/13/2019   PLT 139 (L) 06/13/2019      Chemistry      Component Value Date/Time   NA 136 06/13/2019  0840   K 3.3 (L) 06/13/2019 0840   CL 98 06/13/2019 0840   CO2 27 06/13/2019 0840   BUN 4 (L) 06/13/2019 0840   CREATININE 2.83 (H) 06/13/2019 0840   CREATININE 1.03 (H) 05/11/2016 0931      Component Value Date/Time   CALCIUM 7.5 (L) 06/13/2019 0840   ALKPHOS 43 06/13/2019 0840   AST 18 06/13/2019 0840   ALT 8 06/13/2019 0840   BILITOT 0.5 06/13/2019 0840       RADIOGRAPHIC STUDIES: I have personally reviewed the radiological images as listed and agreed with the findings in the report. Ct Abdomen Pelvis W Contrast  Result Date: 05/25/2019 CLINICAL DATA:  Abdominal distension. EXAM: CT ABDOMEN AND PELVIS WITH CONTRAST TECHNIQUE: Multidetector CT imaging of the abdomen and pelvis was performed using the standard protocol following bolus administration of  intravenous contrast. CONTRAST:  <See Chart> OMNIPAQUE IOHEXOL 300 MG/ML  SOLN COMPARISON:  None. FINDINGS: Lower chest: There is a trace right-sided pleural effusion. There is a trace left-sided pleural effusion. There is atelectasis at the lung bases.The heart size is significantly enlarged. Coronary and aortic calcifications are noted. Hepatobiliary: The liver is normal. Cholelithiasis without acute inflammation.The common bile duct is mildly dilated but tapers normally towards the head of the pancreas. Pancreas: Normal contours without ductal dilatation. No peripancreatic fluid collection. Spleen: No splenic laceration or hematoma. Adrenals/Urinary Tract: --Adrenal glands: No adrenal hemorrhage. --Right kidney/ureter: No hydronephrosis or perinephric hematoma. --Left kidney/ureter: No hydronephrosis or perinephric hematoma. --Urinary bladder: Unremarkable. Stomach/Bowel: --Stomach/Duodenum: No hiatal hernia or other gastric abnormality. Normal duodenal course and caliber. --Small bowel: No dilatation or inflammation. --Colon: No focal abnormality. --Appendix: Not visualized. No right lower quadrant inflammation or free fluid.  Vascular/Lymphatic: Atherosclerotic calcification is present within the non-aneurysmal abdominal aorta, without hemodynamically significant stenosis. --No retroperitoneal lymphadenopathy. --No mesenteric lymphadenopathy. --No pelvic or inguinal lymphadenopathy. Reproductive: Status post hysterectomy. No adnexal mass. Other: There is a trace amount of free fluid in the abdomen and pelvis. There is mild body wall edema. Musculoskeletal. There is a lytic lesion in the L1 vertebral body. There are advanced degenerative changes throughout the visualized thoracolumbar spine. There is no displaced fracture. IMPRESSION: 1. No acute intra-abdominal abnormality detected. 2. Small volume free fluid in the abdomen and pelvis. 3. There is cholelithiasis without secondary signs of acute cholecystitis. 4. Heterogeneous appearance of the osseous structures including a lytic lesion in the L1 vertebral body is consistent with the patient's reported history of multiple myeloma. There is no acute displaced fracture. 5. Cardiomegaly with trace bilateral pleural effusions and adjacent atelectasis. Electronically Signed   By: Constance Holster M.D.   On: 05/25/2019 18:37   Dg Chest Port 1 View  Result Date: 05/25/2019 CLINICAL DATA:  Shortness of breath. EXAM: PORTABLE CHEST 1 VIEW COMPARISON:  Mar 01, 2019 FINDINGS: A right I will is catheter is in good position. No pneumothorax. Cardiomegaly. No overt edema. Mild opacity in left base favored represent atelectasis, and perhaps a tiny effusion or pleural thickening, unchanged. IMPRESSION: 1. Cardiomegaly.  Stable pacemaker. 2. The dialysis catheter is in good position.  No pneumothorax. 3. Opacity in the left retrocardiac region is likely pleural thickening versus a small effusion with associated atelectasis, unchanged. Electronically Signed   By: Dorise Bullion III M.D   On: 05/25/2019 16:22    All questions were answered. The patient knows to call the clinic with any problems,  questions or concerns. No barriers to learning was detected.  I spent 25 minutes counseling the patient face to face. The total time spent in the appointment was 30 minutes and more than 50% was on counseling and review of test results  Heath Lark, MD 06/18/2019 9:56 AM

## 2019-06-18 NOTE — Assessment & Plan Note (Signed)
She has minimum bone pain and has not taken any pain medicine recently We will observe carefully 

## 2019-06-18 NOTE — Assessment & Plan Note (Signed)
I have reviewed the plan of care with the patient She has a suboptimal partial response only with weekly Velcade and dexamethasone Other treatment choices are restricted due to her end-stage renal disease, significant background history of cardiovascular disease and others Due to inability to get additional venous access secondary to her ongoing dialysis needs, I recommend subcutaneous daratumumab We discussed the risk, benefits, side effects of additional subcutaneous daratumumab along with weekly Velcade and dexamethasone and she agreed to proceed She is warned about potential risk of infusion reaction I will try to see if we can get her treatment in a bit due to long hours of treatment. The patient is willing to proceed with the plan of care She understood treatment goal is palliative

## 2019-06-18 NOTE — Assessment & Plan Note (Signed)
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 I will defer to her nephrologist for further management 

## 2019-06-19 ENCOUNTER — Encounter (HOSPITAL_COMMUNITY): Payer: Self-pay

## 2019-06-19 NOTE — Progress Notes (Addendum)
Pacemaker orders faxed to [place]. Message sent to [facility] to review chart. Unable to reach pt via phone  06/19/2019 she was at dialysis.

## 2019-06-20 ENCOUNTER — Inpatient Hospital Stay: Payer: Medicare HMO

## 2019-06-20 ENCOUNTER — Other Ambulatory Visit: Payer: Self-pay

## 2019-06-20 ENCOUNTER — Encounter (HOSPITAL_COMMUNITY): Payer: Self-pay | Admitting: *Deleted

## 2019-06-20 ENCOUNTER — Other Ambulatory Visit (HOSPITAL_COMMUNITY)
Admission: RE | Admit: 2019-06-20 | Discharge: 2019-06-20 | Disposition: A | Discharge status: Home / Self Care | Payer: Medicare HMO | Source: Ambulatory Visit | Attending: Gastroenterology | Admitting: Gastroenterology

## 2019-06-20 VITALS — BP 134/55 | HR 74 | Temp 98.3°F | Resp 16 | Wt 139.5 lb

## 2019-06-20 DIAGNOSIS — C9000 Multiple myeloma not having achieved remission: Secondary | ICD-10-CM

## 2019-06-20 DIAGNOSIS — Z01812 Encounter for preprocedural laboratory examination: Secondary | ICD-10-CM | POA: Insufficient documentation

## 2019-06-20 DIAGNOSIS — Z5111 Encounter for antineoplastic chemotherapy: Secondary | ICD-10-CM | POA: Diagnosis not present

## 2019-06-20 DIAGNOSIS — Z7189 Other specified counseling: Secondary | ICD-10-CM

## 2019-06-20 DIAGNOSIS — D61818 Other pancytopenia: Secondary | ICD-10-CM

## 2019-06-20 DIAGNOSIS — Z20828 Contact with and (suspected) exposure to other viral communicable diseases: Secondary | ICD-10-CM | POA: Diagnosis not present

## 2019-06-20 LAB — COMPREHENSIVE METABOLIC PANEL
ALT: 8 U/L (ref 0–44)
AST: 20 U/L (ref 15–41)
Albumin: 1.6 g/dL — ABNORMAL LOW (ref 3.5–5.0)
Alkaline Phosphatase: 39 U/L (ref 38–126)
Anion gap: 12 (ref 5–15)
BUN: 5 mg/dL — ABNORMAL LOW (ref 8–23)
CO2: 26 mmol/L (ref 22–32)
Calcium: 7.6 mg/dL — ABNORMAL LOW (ref 8.9–10.3)
Chloride: 96 mmol/L — ABNORMAL LOW (ref 98–111)
Creatinine, Ser: 2.44 mg/dL — ABNORMAL HIGH (ref 0.44–1.00)
GFR calc Af Amer: 20 mL/min — ABNORMAL LOW (ref >60)
GFR calc non Af Amer: 17 mL/min — ABNORMAL LOW (ref >60)
Glucose, Bld: 69 mg/dL — ABNORMAL LOW (ref 70–99)
Potassium: 3.2 mmol/L — ABNORMAL LOW (ref 3.5–5.1)
Sodium: 134 mmol/L — ABNORMAL LOW (ref 135–145)
Total Bilirubin: 0.7 mg/dL (ref 0.3–1.2)
Total Protein: 10.3 g/dL — ABNORMAL HIGH (ref 6.5–8.1)

## 2019-06-20 LAB — SAMPLE TO BLOOD BANK

## 2019-06-20 LAB — CBC WITH DIFFERENTIAL/PLATELET
Abs Immature Granulocytes: 0.01 10*3/uL (ref 0.00–0.07)
Basophils Absolute: 0 10*3/uL (ref 0.0–0.1)
Basophils Relative: 0 %
Eosinophils Absolute: 0 10*3/uL (ref 0.0–0.5)
Eosinophils Relative: 1 %
HCT: 24.7 % — ABNORMAL LOW (ref 36.0–46.0)
Hemoglobin: 8.2 g/dL — ABNORMAL LOW (ref 12.0–15.0)
Immature Granulocytes: 0 %
Lymphocytes Relative: 42 %
Lymphs Abs: 1.4 10*3/uL (ref 0.7–4.0)
MCH: 30.6 pg (ref 26.0–34.0)
MCHC: 33.2 g/dL (ref 30.0–36.0)
MCV: 92.2 fL (ref 80.0–100.0)
Monocytes Absolute: 0.4 10*3/uL (ref 0.1–1.0)
Monocytes Relative: 11 %
Neutro Abs: 1.5 10*3/uL — ABNORMAL LOW (ref 1.7–7.7)
Neutrophils Relative %: 46 %
Platelets: 154 10*3/uL (ref 150–400)
RBC: 2.68 MIL/uL — ABNORMAL LOW (ref 3.87–5.11)
RDW: 19.7 % — ABNORMAL HIGH (ref 11.5–15.5)
WBC: 3.3 10*3/uL — ABNORMAL LOW (ref 4.0–10.5)
nRBC: 0 % (ref 0.0–0.2)

## 2019-06-20 LAB — SARS CORONAVIRUS 2 (TAT 6-24 HRS): SARS Coronavirus 2: NEGATIVE

## 2019-06-20 MED ORDER — DIPHENHYDRAMINE HCL 25 MG PO CAPS
ORAL_CAPSULE | ORAL | Status: AC
  Filled 2019-06-20: qty 1

## 2019-06-20 MED ORDER — ACETAMINOPHEN 325 MG PO TABS
ORAL_TABLET | ORAL | Status: AC
  Filled 2019-06-20: qty 2

## 2019-06-20 MED ORDER — ACETAMINOPHEN 325 MG PO TABS
650.0000 mg | ORAL_TABLET | Freq: Once | ORAL | Status: AC
  Administered 2019-06-20: 650 mg via ORAL

## 2019-06-20 MED ORDER — DEXAMETHASONE 4 MG PO TABS
ORAL_TABLET | ORAL | Status: AC
  Filled 2019-06-20: qty 5

## 2019-06-20 MED ORDER — DEXAMETHASONE 4 MG PO TABS
20.0000 mg | ORAL_TABLET | Freq: Once | ORAL | Status: AC
  Administered 2019-06-20: 20 mg via ORAL

## 2019-06-20 MED ORDER — DARATUMUMAB-HYALURONIDASE-FIHJ 1800-30000 MG-UT/15ML ~~LOC~~ SOLN
1800.0000 mg | Freq: Once | SUBCUTANEOUS | Status: AC
  Administered 2019-06-20: 1800 mg via SUBCUTANEOUS
  Filled 2019-06-20: qty 15

## 2019-06-20 MED ORDER — DIPHENHYDRAMINE HCL 25 MG PO CAPS
25.0000 mg | ORAL_CAPSULE | Freq: Once | ORAL | Status: AC
  Administered 2019-06-20: 25 mg via ORAL

## 2019-06-20 MED ORDER — PROCHLORPERAZINE MALEATE 10 MG PO TABS
10.0000 mg | ORAL_TABLET | Freq: Once | ORAL | Status: AC
  Administered 2019-06-20: 10 mg via ORAL

## 2019-06-20 MED ORDER — BORTEZOMIB CHEMO SQ INJECTION 3.5 MG (2.5MG/ML)
1.0400 mg/m2 | Freq: Once | INTRAMUSCULAR | Status: AC
  Administered 2019-06-20: 1.75 mg via SUBCUTANEOUS
  Filled 2019-06-20: qty 0.7

## 2019-06-20 MED ORDER — PROCHLORPERAZINE MALEATE 10 MG PO TABS
ORAL_TABLET | ORAL | Status: AC
  Filled 2019-06-20: qty 1

## 2019-06-20 NOTE — Patient Instructions (Addendum)
Daratumumab injection What is this medicine? DARATUMUMAB (dar a toom ue mab) is a monoclonal antibody. It is used to treat multiple myeloma. This medicine may be used for other purposes; ask your health care provider or pharmacist if you have questions. COMMON BRAND NAME(S): DARZALEX What should I tell my health care provider before I take this medicine? They need to know if you have any of these conditions:  infection (especially a virus infection such as chickenpox, herpes, or hepatitis B virus)  lung or breathing disease  an unusual or allergic reaction to daratumumab, other medicines, foods, dyes, or preservatives  pregnant or trying to get pregnant  breast-feeding How should I use this medicine? This medicine is for infusion into a vein. It is given by a health care professional in a hospital or clinic setting. Talk to your pediatrician regarding the use of this medicine in children. Special care may be needed. Overdosage: If you think you have taken too much of this medicine contact a poison control center or emergency room at once. NOTE: This medicine is only for you. Do not share this medicine with others. What if I miss a dose? Keep appointments for follow-up doses as directed. It is important not to miss your dose. Call your doctor or health care professional if you are unable to keep an appointment. What may interact with this medicine? Interactions have not been studied. This list may not describe all possible interactions. Give your health care provider a list of all the medicines, herbs, non-prescription drugs, or dietary supplements you use. Also tell them if you smoke, drink alcohol, or use illegal drugs. Some items may interact with your medicine. What should I watch for while using this medicine? This drug may make you feel generally unwell. Report any side effects. Continue your course of treatment even though you feel ill unless your doctor tells you to stop. This  medicine can cause serious allergic reactions. To reduce your risk you may need to take medicine before treatment with this medicine. Take your medicine as directed. This medicine can affect the results of blood tests to match your blood type. These changes can last for up to 6 months after the final dose. Your healthcare provider will do blood tests to match your blood type before you start treatment. Tell all of your healthcare providers that you are being treated with this medicine before receiving a blood transfusion. This medicine can affect the results of some tests used to determine treatment response; extra tests may be needed to evaluate response. Do not become pregnant while taking this medicine or for 3 months after stopping it. Women should inform their doctor if they wish to become pregnant or think they might be pregnant. There is a potential for serious side effects to an unborn child. Talk to your health care professional or pharmacist for more information. What side effects may I notice from receiving this medicine? Side effects that you should report to your doctor or health care professional as soon as possible:  allergic reactions like skin rash, itching or hives, swelling of the face, lips, or tongue  breathing problems  chills  cough  dizziness  feeling faint or lightheaded  headache  low blood counts - this medicine may decrease the number of white blood cells, red blood cells and platelets. You may be at increased risk for infections and bleeding.  nausea, vomiting  shortness of breath  signs of decreased platelets or bleeding - bruising, pinpoint red spots on  the skin, black, tarry stools, blood in the urine  signs of decreased red blood cells - unusually weak or tired, feeling faint or lightheaded, falls  signs of infection - fever or chills, cough, sore throat, pain or difficulty passing urine  signs and symptoms of liver injury like dark yellow or brown  urine; general ill feeling or flu-like symptoms; light-colored stools; loss of appetite; right upper belly pain; unusually weak or tired; yellowing of the eyes or skin Side effects that usually do not require medical attention (report to your doctor or health care professional if they continue or are bothersome):  back pain  constipation  loss of appetite  diarrhea  joint pain  muscle cramps  pain, tingling, numbness in the hands or feet  swelling of the ankles, feet, hands  tiredness  trouble sleeping This list may not describe all possible side effects. Call your doctor for medical advice about side effects. You may report side effects to FDA at 1-800-FDA-1088. Where should I keep my medicine? Keep out of the reach of children. This drug is given in a hospital or clinic and will not be stored at home. NOTE: This sheet is a summary. It may not cover all possible information. If you have questions about this medicine, talk to your doctor, pharmacist, or health care provider.  2020 Elsevier/Gold Standard (2018-07-18 14:00:48)    [facility] Discharge Instructions for Patients Receiving Chemotherapy  Today you received the following chemotherapy agents: Velcade and Darzalex  To help prevent nausea and vomiting after your treatment, we encourage you to take your nausea medication as directed.   If you develop nausea and vomiting that is not controlled by your nausea medication, call the clinic.   BELOW ARE SYMPTOMS THAT SHOULD BE REPORTED IMMEDIATELY:  *FEVER GREATER THAN 100.5 F  *CHILLS WITH OR WITHOUT FEVER  NAUSEA AND VOMITING THAT IS NOT CONTROLLED WITH YOUR NAUSEA MEDICATION  *UNUSUAL SHORTNESS OF BREATH  *UNUSUAL BRUISING OR BLEEDING  TENDERNESS IN MOUTH AND THROAT WITH OR WITHOUT PRESENCE OF ULCERS  *URINARY PROBLEMS  *BOWEL PROBLEMS  UNUSUAL RASH Items with * indicate a potential emergency and should be followed up as soon as  possible.  Feel free to call the clinic should you have any questions or concerns. The clinic phone number is [phone].  Please show the [place] at check-in to the Emergency Department and triage nurse.

## 2019-06-21 ENCOUNTER — Other Ambulatory Visit (HOSPITAL_COMMUNITY): Payer: Medicare HMO

## 2019-06-23 NOTE — Anesthesia Preprocedure Evaluation (Addendum)
Anesthesia Evaluation  Patient identified by MRN, date of birth, ID band Patient awake    Reviewed: Allergy & Precautions, NPO status , Patient's Chart, lab work & pertinent test results  History of Anesthesia Complications Negative for: history of anesthetic complications  Airway Mallampati: II  TM Distance: >3 FB Neck ROM: Full    Dental no notable dental hx. (+) Dental Advisory Given   Pulmonary shortness of breath and with exertion, sleep apnea and Continuous Positive Airway Pressure Ventilation ,    Pulmonary exam normal        Cardiovascular hypertension, Pt. on medications Normal cardiovascular exam     Neuro/Psych negative neurological ROS  negative psych ROS   GI/Hepatic negative GI ROS, Neg liver ROS,   Endo/Other  diabetes, Oral Hypoglycemic Agents  Renal/GU ESRFRenal disease     Musculoskeletal Gout   Abdominal   Peds  Hematology  (+) Blood dyscrasia, anemia , Thrombocytopenia    Anesthesia Other Findings end stage renal disease  Reproductive/Obstetrics                            Anesthesia Physical  Anesthesia Plan  ASA: III  Anesthesia Plan: MAC   Post-op Pain Management:    Induction:   PONV Risk Score and Plan: 2 and Ondansetron and Dexamethasone  Airway Management Planned: Simple Face Mask and Natural Airway  Additional Equipment:   Intra-op Plan:   Post-operative Plan:   Informed Consent: I have reviewed the patients History and Physical, chart, labs and discussed the procedure including the risks, benefits and alternatives for the proposed anesthesia with the patient or authorized representative who has indicated his/her understanding and acceptance.     Dental advisory given  Plan Discussed with: CRNA and Anesthesiologist  Anesthesia Plan Comments:        Anesthesia Quick Evaluation

## 2019-06-24 ENCOUNTER — Encounter (HOSPITAL_COMMUNITY)
Admission: RE | Disposition: A | Discharge status: Home / Self Care | Payer: Self-pay | Source: Home / Self Care | Attending: Gastroenterology

## 2019-06-24 ENCOUNTER — Ambulatory Visit (HOSPITAL_COMMUNITY)
Admission: RE | Admit: 2019-06-24 | Discharge: 2019-06-24 | Disposition: A | Discharge status: Home / Self Care | Payer: Medicare HMO | Attending: Gastroenterology | Admitting: Gastroenterology

## 2019-06-24 ENCOUNTER — Other Ambulatory Visit: Payer: Self-pay

## 2019-06-24 ENCOUNTER — Ambulatory Visit (HOSPITAL_COMMUNITY): Payer: Medicare HMO | Admitting: Anesthesiology

## 2019-06-24 ENCOUNTER — Encounter (HOSPITAL_COMMUNITY): Payer: Self-pay | Admitting: *Deleted

## 2019-06-24 ENCOUNTER — Telehealth: Payer: Self-pay

## 2019-06-24 DIAGNOSIS — R634 Abnormal weight loss: Secondary | ICD-10-CM | POA: Insufficient documentation

## 2019-06-24 DIAGNOSIS — E1139 Type 2 diabetes mellitus with other diabetic ophthalmic complication: Secondary | ICD-10-CM | POA: Diagnosis not present

## 2019-06-24 DIAGNOSIS — Z888 Allergy status to other drugs, medicaments and biological substances status: Secondary | ICD-10-CM | POA: Insufficient documentation

## 2019-06-24 DIAGNOSIS — H42 Glaucoma in diseases classified elsewhere: Secondary | ICD-10-CM | POA: Insufficient documentation

## 2019-06-24 DIAGNOSIS — Z992 Dependence on renal dialysis: Secondary | ICD-10-CM | POA: Diagnosis not present

## 2019-06-24 DIAGNOSIS — G4733 Obstructive sleep apnea (adult) (pediatric): Secondary | ICD-10-CM | POA: Insufficient documentation

## 2019-06-24 DIAGNOSIS — E1122 Type 2 diabetes mellitus with diabetic chronic kidney disease: Secondary | ICD-10-CM | POA: Insufficient documentation

## 2019-06-24 DIAGNOSIS — K2960 Other gastritis without bleeding: Secondary | ICD-10-CM | POA: Diagnosis not present

## 2019-06-24 DIAGNOSIS — R630 Anorexia: Secondary | ICD-10-CM | POA: Insufficient documentation

## 2019-06-24 DIAGNOSIS — C9000 Multiple myeloma not having achieved remission: Secondary | ICD-10-CM | POA: Insufficient documentation

## 2019-06-24 DIAGNOSIS — N186 End stage renal disease: Secondary | ICD-10-CM | POA: Diagnosis not present

## 2019-06-24 DIAGNOSIS — Z880 Allergy status to penicillin: Secondary | ICD-10-CM | POA: Diagnosis not present

## 2019-06-24 DIAGNOSIS — Z950 Presence of cardiac pacemaker: Secondary | ICD-10-CM | POA: Insufficient documentation

## 2019-06-24 DIAGNOSIS — R110 Nausea: Secondary | ICD-10-CM | POA: Insufficient documentation

## 2019-06-24 DIAGNOSIS — Z885 Allergy status to narcotic agent status: Secondary | ICD-10-CM | POA: Diagnosis not present

## 2019-06-24 DIAGNOSIS — I120 Hypertensive chronic kidney disease with stage 5 chronic kidney disease or end stage renal disease: Secondary | ICD-10-CM | POA: Insufficient documentation

## 2019-06-24 DIAGNOSIS — R6881 Early satiety: Secondary | ICD-10-CM | POA: Diagnosis not present

## 2019-06-24 DIAGNOSIS — K319 Disease of stomach and duodenum, unspecified: Secondary | ICD-10-CM | POA: Insufficient documentation

## 2019-06-24 DIAGNOSIS — K2950 Unspecified chronic gastritis without bleeding: Secondary | ICD-10-CM | POA: Diagnosis not present

## 2019-06-24 DIAGNOSIS — R140 Abdominal distension (gaseous): Secondary | ICD-10-CM | POA: Insufficient documentation

## 2019-06-24 DIAGNOSIS — K3189 Other diseases of stomach and duodenum: Secondary | ICD-10-CM | POA: Diagnosis not present

## 2019-06-24 HISTORY — PX: BIOPSY: SHX5522

## 2019-06-24 HISTORY — DX: Anemia, unspecified: D64.9

## 2019-06-24 HISTORY — DX: Other pancytopenia: D61.818

## 2019-06-24 HISTORY — DX: Multiple myeloma not having achieved remission: C90.00

## 2019-06-24 HISTORY — PX: ESOPHAGOGASTRODUODENOSCOPY (EGD) WITH PROPOFOL: SHX5813

## 2019-06-24 HISTORY — DX: Dependence on renal dialysis: Z99.2

## 2019-06-24 HISTORY — DX: End stage renal disease: N18.6

## 2019-06-24 LAB — MULTIPLE MYELOMA PANEL, SERUM
Albumin SerPl Elph-Mcnc: 2.7 g/dL — ABNORMAL LOW (ref 2.9–4.4)
Albumin/Glob SerPl: 0.4 — ABNORMAL LOW (ref 0.7–1.7)
Alpha 1: 0.2 g/dL (ref 0.0–0.4)
Alpha2 Glob SerPl Elph-Mcnc: 0.6 g/dL (ref 0.4–1.0)
B-Globulin SerPl Elph-Mcnc: 6.1 g/dL — ABNORMAL HIGH (ref 0.7–1.3)
Gamma Glob SerPl Elph-Mcnc: 0.3 g/dL — ABNORMAL LOW (ref 0.4–1.8)
Globulin, Total: 7.1 g/dL — ABNORMAL HIGH (ref 2.2–3.9)
IgA: 19 mg/dL — ABNORMAL LOW (ref 64–422)
IgG (Immunoglobin G), Serum: 6241 mg/dL — ABNORMAL HIGH (ref 586–1602)
IgM (Immunoglobulin M), Srm: 8 mg/dL — ABNORMAL LOW (ref 26–217)
M Protein SerPl Elph-Mcnc: 5.6 g/dL — ABNORMAL HIGH
Total Protein ELP: 9.8 g/dL — ABNORMAL HIGH (ref 6.0–8.5)

## 2019-06-24 LAB — GLUCOSE, CAPILLARY
Glucose-Capillary: 55 mg/dL — ABNORMAL LOW (ref 70–99)
Glucose-Capillary: 58 mg/dL — ABNORMAL LOW (ref 70–99)
Glucose-Capillary: 77 mg/dL (ref 70–99)

## 2019-06-24 LAB — POCT I-STAT 4, (NA,K, GLUC, HGB,HCT)
Glucose, Bld: 68 mg/dL — ABNORMAL LOW (ref 70–99)
HCT: 30 % — ABNORMAL LOW (ref 36.0–46.0)
Hemoglobin: 10.2 g/dL — ABNORMAL LOW (ref 12.0–15.0)
Potassium: 3.8 mmol/L (ref 3.5–5.1)
Sodium: 136 mmol/L (ref 135–145)

## 2019-06-24 LAB — KAPPA/LAMBDA LIGHT CHAINS
Kappa free light chain: 5703 mg/L — ABNORMAL HIGH (ref 3.3–19.4)
Kappa, lambda light chain ratio: 456.24 — ABNORMAL HIGH (ref 0.26–1.65)
Lambda free light chains: 12.5 mg/L (ref 5.7–26.3)

## 2019-06-24 SURGERY — ESOPHAGOGASTRODUODENOSCOPY (EGD) WITH PROPOFOL
Anesthesia: Monitor Anesthesia Care

## 2019-06-24 MED ORDER — ONDANSETRON HCL 4 MG/2ML IJ SOLN
INTRAMUSCULAR | Status: DC | PRN
  Administered 2019-06-24: 4 mg via INTRAVENOUS

## 2019-06-24 MED ORDER — PROPOFOL 10 MG/ML IV BOLUS
INTRAVENOUS | Status: AC
  Filled 2019-06-24: qty 20

## 2019-06-24 MED ORDER — PROPOFOL 10 MG/ML IV BOLUS
INTRAVENOUS | Status: DC | PRN
  Administered 2019-06-24 (×2): 20 mg via INTRAVENOUS

## 2019-06-24 MED ORDER — DEXAMETHASONE SODIUM PHOSPHATE 10 MG/ML IJ SOLN
INTRAMUSCULAR | Status: DC | PRN
  Administered 2019-06-24: 4 mg via INTRAVENOUS

## 2019-06-24 MED ORDER — SODIUM CHLORIDE 0.9 % IV SOLN
INTRAVENOUS | Status: DC
  Administered 2019-06-24: 500 mL via INTRAVENOUS

## 2019-06-24 MED ORDER — PROPOFOL 500 MG/50ML IV EMUL
INTRAVENOUS | Status: DC | PRN
  Administered 2019-06-24: 75 ug/kg/min via INTRAVENOUS

## 2019-06-24 MED ORDER — LIDOCAINE 2% (20 MG/ML) 5 ML SYRINGE
INTRAMUSCULAR | Status: DC | PRN
  Administered 2019-06-24: 100 mg via INTRAVENOUS

## 2019-06-24 SURGICAL SUPPLY — 15 items

## 2019-06-24 NOTE — Telephone Encounter (Signed)
-----   Message from Manuella Ghazi, RN sent at 06/20/2019 12:52 PM EDT ----- Regarding: [name]- First chemo follow up Please follow up with patient. She received darzalex faspro with no issues today following 2 hour wait period.

## 2019-06-24 NOTE — Anesthesia Procedure Notes (Signed)
Procedure Name: MAC Date/Time: 06/24/2019 8:27 AM Performed by: Lollie Sails, CRNA Pre-anesthesia Checklist: Patient identified, Emergency Drugs available, Suction available, Patient being monitored and Timeout performed Oxygen Delivery Method: Nasal cannula

## 2019-06-24 NOTE — H&P (Signed)
[name] is an 85 y.o. female.   Chief Complaint: Weight loss HPI: This is an 85 year old female with anorexia, weight loss, and bloating, diagnosed about 4 months ago with multiple myeloma and end-stage renal disease, on dialysis and weekly chemotherapy.  Associated with this has been a decreased appetite and early satiety with some nausea and bloating, and a weight loss of perhaps 30 pounds.  The symptoms are actually somewhat better since going on a gastroparesis type diet.  A CT about a month ago did not show any bowel obstruction or overt abdominal distention.  Past Medical History:  Diagnosis Date  . Anemia   . Benign paroxysmal positional vertigo 04/06/2010  . Benign positional vertigo   . 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 CAROTID BRUIT 02/24/2008  . DIABETES MELLITUS, TYPE II 02/24/2008  . ESRD (end stage renal disease) on dialysis ([place])    tues thurs sat [facility] sees [place] [facility]  . Glaucoma   . HYPERTENSION 02/24/2008  . Multiple myeloma ([place])   . Obstructive sleep apnea   . Pancytopenia ([place])   . Sick sinus syndrome [facility])     Past Surgical History:  Procedure Laterality Date  . ABDOMINAL HYSTERECTOMY     1980's  . AV FISTULA PLACEMENT Right 03/11/2019   Procedure: CREATION RIGHT ARM RADIOCEPHALIC ARTERIOVENOUS FISTULA;  Surgeon: Angelia Mould, MD;  Location: [place];  Service: Vascular;  Laterality: Right;  . BACK SURGERY     x 3  . EP IMPLANTABLE DEVICE N/A 05/16/2016   Procedure: PPM Generator Changeout;  Surgeon: Thompson Grayer, MD;  Location: [place] [facility];  Service: Cardiovascular;  Laterality: N/A;  . FISTULA SUPERFICIALIZATION Right 03/11/2019   Procedure: FISTULA SUPERFICIALIZATION;  Surgeon: Angelia Mould, MD;  Location: [place];  Service: Vascular;  Laterality: Right;  . IR FLUORO GUIDE CV LINE RIGHT  03/07/2019  . IR US GUIDE VASC ACCESS RIGHT  03/07/2019  . KNEE SURGERY Right    2003  . KNEE SURGERY Left    2011  . PACEMAKER  INSERTION      Family History  Problem Relation Age of Onset  . Congestive Heart Failure Mother   . Tuberculosis Mother   . Multiple myeloma Mother   . Bone cancer Father   . Arthritis Father   . Breast cancer Sister   . Colon cancer Sister   . Hypertension Sister   . Dementia Sister   . Alcohol abuse Son    Social History:  reports that she has never smoked. She has never used smokeless tobacco. She reports that she does not drink alcohol or use drugs.  Allergies:  Allergies  Allergen Reactions  . Aspirin Other (See Comments)    REACTION: STOMACH ISSUES WITH DOSE HIGHER THAN 81 MG  Other reaction(s): GI Upset (intolerance), Other (See Comments) REACTION: STOMACH ISSUES WITH DOSE HIGHER THAN 81 MG  REACTION: STOMACH ISSUES WITH DOSE HIGHER THAN 81 MG   . Hydrocodone-Acetaminophen Nausea And Vomiting  . Morphine Nausea And Vomiting  . Penicillins Rash    Patient took injection and tablets, had a reaction. She has taken amoxicillin with no reaction Has patient had a PCN reaction causing immediate rash, facial/tongue/throat swelling, SOB or lightheadedness with hypotension: Yes Has patient had a PCN reaction causing severe rash involving mucus membranes or skin necrosis: Yes Has patient had a PCN reaction that required hospitalization No Has patient had a PCN reaction occurring within the last 10 years: No  If all of the above answers are "NO", then m Patient took injection and tablets, had a reaction. She has taken amoxicillin with no reaction Patient took injection and tablets, had a reaction. She has taken amoxicillin with no reaction Has patient had a PCN reaction causing immediate rash, facial/tongue/throat swelling, SOB or lightheadedness with hypotension: Yes Has patient had a PCN reaction causing severe rash involving mucus membranes or skin necrosis: Yes Has patient had a PCN reaction that required hospitalization No Has patient had a PCN reaction occurring within the  last 10 years: No If all of the above answers are "NO", then m  . Brimonidine Itching  . Diltiazem Hcl Rash  . Hydrocodone-Acetaminophen Nausea And Vomiting  . Neurontin [Gabapentin] Nausea And Vomiting    Medications Prior to Admission  Medication Sig Dispense Refill  . acetaminophen (TYLENOL) 500 MG tablet Take 500 mg by mouth 2 (two) times daily as needed for moderate pain.    Marland Kitchen acyclovir (ZOVIRAX) 400 MG tablet Take 1 tablet (400 mg total) by mouth daily. 30 tablet 6  . allopurinol (ZYLOPRIM) 100 MG tablet Take 0.5 tablets (50 mg total) by mouth daily. 30 tablet 0  . B Complex-C-Zn-Folic Acid (DIALYVITE 299-MEQA 15) 0.8 MG TABS Take 1 tablet by mouth every evening.     Marland Kitchen dexamethasone (DECADRON) 4 MG tablet TAKE 1 TABLET BY MOUTH EVERY DAY (Patient taking differently: Take 4 mg by mouth 4 (four) times a week. Take 1 tablet (4 mg) by mouth on Sundays, Mondays,Wednesdays & Fridays.) 30 tablet 0  . dorzolamide (TRUSOPT) 2 % ophthalmic solution Place 1 drop into the left eye every evening.   1  . ethyl chloride spray Apply 1 application topically as needed (prior to port being accessed).     . fluorometholone (FML) 0.1 % ophthalmic suspension Place 1 drop into the right eye 3 (three) times daily.     . isosorbide-hydrALAZINE (BIDIL) 20-37.5 MG tablet Take 1 tablet by mouth 2 (two) times a day. 60 tablet 0  . lactulose (CHRONULAC) 10 GM/15ML solution TAKE 15 MLS (10 G TOTAL) BY MOUTH 2 (TWO) TIMES DAILY. 473 mL 0  . LUMIGAN 0.01 % SOLN Place 1 drop into the left eye every evening.     Marland Kitchen LYRICA 50 MG capsule Take 50 mg by mouth at bedtime as needed (pain/neuropathy).     . ondansetron (ZOFRAN) 4 MG tablet Take 1 tablet (4 mg total) by mouth every 8 (eight) hours as needed for nausea. 30 tablet 1  . pantoprazole (PROTONIX) 40 MG tablet Take 1 tablet (40 mg total) by mouth daily. (Patient taking differently: Take 40 mg by mouth daily as needed (acid reflux/indigestion.). ) 30 tablet 1  .  polyethylene glycol (MIRALAX / GLYCOLAX) 17 g packet Take 17 g by mouth daily as needed (constipation).    . prochlorperazine (COMPAZINE) 5 MG tablet Take 1 tablet (5 mg total) by mouth every 6 (six) hours as needed for nausea or vomiting. 30 tablet 1  . senna-docusate (SENOKOT-S) 8.6-50 MG tablet Take 2 tablets by mouth 2 (two) times daily. (Patient taking differently: Take 2 tablets by mouth 2 (two) times daily as needed (constipation.). ) 30 tablet 0  . sevelamer carbonate (RENVELA) 800 MG tablet Take 800-1,200 mg by mouth daily.     . sodium chloride (OCEAN) 0.65 % SOLN nasal spray Place 1 spray into both nostrils 4 (four) times daily as needed for congestion.     Marland Kitchen nystatin (MYCOSTATIN/NYSTOP) powder Apply topically  4 (four) times daily. (Patient not taking: Reported on 06/18/2019) 15 g 0  . sevelamer carbonate (RENVELA) 2.4 g PACK Take 2.4 g by mouth 3 (three) times daily with meals. (Patient not taking: Reported on 06/18/2019) 90 each 0    Results for orders placed or performed during the hospital encounter of 06/24/19 (from the past 48 hour(s))  I-STAT 4, (NA,K, GLUC, HGB,HCT)     Status: Abnormal   Collection Time: 06/24/19  7:50 AM  Result Value Ref Range   Sodium 136 135 - 145 mmol/L   Potassium 3.8 3.5 - 5.1 mmol/L   Glucose, Bld 68 (L) 70 - 99 mg/dL   HCT 30.0 (L) 36.0 - 46.0 %   Hemoglobin 10.2 (L) 12.0 - 15.0 g/dL   No results found.  ROS uses a walker because of hip problems, dyspnea with mild exertion (EF 65%, has pacemaker).  Tendency for constipation, uses MiraLAX and Senokot  Blood pressure (!) 162/72, pulse 96, temperature 98 F (36.7 C), temperature source Oral, resp. rate 20, height _0  (1.651 m), weight 63.3 kg, SpO2 99 %. Physical Exam pleasant, cognitively preserved African-American female who does appear to have some muscle wasting but is not frankly cachectic.  No evident distress.  No respiratory distress.  Chest is clear anteriorly.  Heart without murmur or  arrhythmia.  Abdomen still has some residual adiposity, no mass-effect or tenderness  Assessment/Plan Upper tract symptoms which could be related to her underlying diseases, but of course could be due to some sort of gastric pathology.  Note that she is on pantoprazole 40 mg daily and is not on ulcerogenic medications any longer (previously was on aspirin and Celebrex).  We will proceed to endoscopic evaluation, for which I have previously discussed the nature purpose, and risks and she is agreeable.  Cleotis Nipper, MD 06/24/2019, 8:14 AM

## 2019-06-24 NOTE — Anesthesia Postprocedure Evaluation (Signed)
Anesthesia Post Note  Patient: [name]  Procedure(s) Performed: ESOPHAGOGASTRODUODENOSCOPY (EGD) WITH PROPOFOL (N/A ) BIOPSY     Patient location during evaluation: Endoscopy Anesthesia Type: MAC Level of consciousness: awake and alert Pain management: pain level controlled Vital Signs Assessment: post-procedure vital signs reviewed and stable Respiratory status: spontaneous breathing, nonlabored ventilation, respiratory function stable and patient connected to nasal cannula oxygen Cardiovascular status: blood pressure returned to baseline and stable Postop Assessment: no apparent nausea or vomiting Anesthetic complications: no    Last Vitals:  Vitals:   06/24/19 0844 06/24/19 0850  BP: (!) 123/53 139/67  Pulse: 74 67  Resp: 15 10  Temp: (!) 36.4 C   SpO2: 100% 100%    Last Pain:  Vitals:   06/24/19 0844  TempSrc: Oral  PainSc: 6                  Zela Sobieski DANIEL

## 2019-06-24 NOTE — Progress Notes (Signed)
1027 Patient CBG 58, Dr Tobias Alexander notified. Patient given Sprite.  0915 CBG 55. Encouraged to finish Sprite, given graham crackers.  0940 CBG 77.

## 2019-06-24 NOTE — Transfer of Care (Signed)
Immediate Anesthesia Transfer of Care Note  Patient: [name]  Procedure(s) Performed: ESOPHAGOGASTRODUODENOSCOPY (EGD) WITH PROPOFOL (N/A ) BIOPSY  Patient Location: PACU and Endoscopy Unit  Anesthesia Type:MAC  Level of Consciousness: awake, alert  and oriented  Airway & Oxygen Therapy: Patient Spontanous Breathing and Patient connected to nasal cannula oxygen  Post-op Assessment: Report given to RN and Post -op Vital signs reviewed and stable  Post vital signs: Reviewed and stable  Last Vitals:  Vitals Value Taken Time  BP    Temp    Pulse    Resp    SpO2      Last Pain:  Vitals:   06/24/19 0744  TempSrc: Oral  PainSc: 0-No pain         Complications: No apparent anesthesia complications

## 2019-06-24 NOTE — Progress Notes (Signed)
Blood sugar on Istat is 68. Pt states she does not currently feel bad. Delfin Edis CRNA aware and made Dr. Tobias Alexander aware. No new orders. Will continue to assess.

## 2019-06-24 NOTE — Telephone Encounter (Signed)
Called and spoke with family member to see how she is doing after first treatment Friday. She is doing okay and out at another appts. Instructed family member to have her call the office for questions or concerns. He verbalized understanding.

## 2019-06-24 NOTE — Op Note (Signed)
[facility] Patient Name: [name] Procedure Date: 06/24/2019 MRN: [id number] Attending MD: Ronald Lobo , MD Date of Birth: [birth year] CSN: [id number] Age: 85 Admit Type: Outpatient Procedure:                Upper GI endoscopy Indications:              Anorexia, Early satiety, Weight loss Providers:                Ronald Lobo, MD, Jeanella Cara, RN,                            Josie Dixon, RN, Marguerita Merles, Technician Referring MD:              Medicines:                Monitored Anesthesia Care Complications:            No immediate complications. Estimated Blood Loss:     Estimated blood loss was minimal. Procedure:                Pre-Anesthesia Assessment:                           - Prior to the procedure, a History and Physical                            was performed, and patient medications and                            allergies were reviewed. The patient's tolerance of                            previous anesthesia was also reviewed. The risks                            and benefits of the procedure and the sedation                            options and risks were discussed with the patient.                            All questions were answered, and informed consent                            was obtained. Prior Anticoagulants: The patient has                            taken no previous anticoagulant or antiplatelet                            agents. ASA Grade Assessment: III - A patient with                            severe systemic disease. After reviewing the risks  and benefits, the patient was deemed in                            satisfactory condition to undergo the procedure.                           After obtaining informed consent, the endoscope was                            passed under direct vision. Throughout the                            procedure, the patient's blood pressure, pulse,  and                            oxygen saturations were monitored continuously. The                            GIF-H190 ([id number]) Olympus gastroscope was                            introduced through the mouth, and advanced to the                            second part of duodenum. The upper GI endoscopy was                            accomplished without difficulty. The patient                            tolerated the procedure well. Scope In: Scope Out: Findings:      The examined esophagus was normal.      The stomach contained no significant residual, although no motility was       noted.      Diffuse mild inflammation characterized by congestion (edema) and       erythema was found in the prepyloric region of the stomach. No mass,       erosions or ulcers were noted. Biopsies were taken with a cold forceps       for histology. Estimated blood loss was minimal.      The exam of the stomach was otherwise normal.      The cardia and gastric fundus were normal on retroflexion.      Diffuse atrophic mucosa (characterized by decreased duodenal folds) was       found in the second portion of the duodenum. Biopsies were taken with a       cold forceps for histology.      The exam of the duodenum was otherwise normal. Impression:               - Normal esophagus.                           - Chronic non-erosive gastritis. Biopsied.                           - Duodenal mucosal  atrophy. Biopsied.                           - No definite cause for patient's symptoms                            identified. Moderate Sedation:      This patient was sedated with monitored anesthesia care, not moderate       sedation. Recommendation:           - Continue present medications.                           - Await pathology results. Procedure Code(s):        --- Professional ---                           [phone], Esophagogastroduodenoscopy, flexible,                            transoral; with biopsy,  single or multiple Diagnosis Code(s):        --- Professional ---                           K29.50, Unspecified chronic gastritis without                            bleeding                           K29.60, Other gastritis without bleeding                           K31.89, Other diseases of stomach and duodenum                           R63.0, Anorexia                           R68.81, Early satiety                           R63.4, Abnormal weight loss CPT copyright 2019 [employer]. All rights reserved. The codes documented in this report are preliminary and upon coder review may  be revised to meet current compliance requirements. Ronald Lobo, MD 06/24/2019 8:48:56 AM This report has been signed electronically. Number of Addenda: 0

## 2019-06-24 NOTE — Discharge Instructions (Signed)

## 2019-06-25 ENCOUNTER — Other Ambulatory Visit: Payer: Self-pay | Admitting: Hematology and Oncology

## 2019-06-26 ENCOUNTER — Encounter (HOSPITAL_COMMUNITY): Payer: Self-pay | Admitting: Gastroenterology

## 2019-06-27 ENCOUNTER — Other Ambulatory Visit: Payer: Self-pay

## 2019-06-27 ENCOUNTER — Inpatient Hospital Stay: Payer: Medicare HMO

## 2019-06-27 VITALS — BP 108/58 | HR 80 | Temp 97.8°F | Resp 18

## 2019-06-27 DIAGNOSIS — Z5111 Encounter for antineoplastic chemotherapy: Secondary | ICD-10-CM | POA: Diagnosis not present

## 2019-06-27 DIAGNOSIS — C9000 Multiple myeloma not having achieved remission: Secondary | ICD-10-CM

## 2019-06-27 DIAGNOSIS — Z7189 Other specified counseling: Secondary | ICD-10-CM

## 2019-06-27 DIAGNOSIS — D61818 Other pancytopenia: Secondary | ICD-10-CM

## 2019-06-27 LAB — COMPREHENSIVE METABOLIC PANEL
ALT: 8 U/L (ref 0–44)
AST: 18 U/L (ref 15–41)
Albumin: 1.5 g/dL — ABNORMAL LOW (ref 3.5–5.0)
Alkaline Phosphatase: 44 U/L (ref 38–126)
Anion gap: 10 (ref 5–15)
BUN: 9 mg/dL (ref 8–23)
CO2: 25 mmol/L (ref 22–32)
Calcium: 7.3 mg/dL — ABNORMAL LOW (ref 8.9–10.3)
Chloride: 98 mmol/L (ref 98–111)
Creatinine, Ser: 3.19 mg/dL (ref 0.44–1.00)
GFR calc Af Amer: 15 mL/min — ABNORMAL LOW (ref >60)
GFR calc non Af Amer: 13 mL/min — ABNORMAL LOW (ref >60)
Glucose, Bld: 75 mg/dL (ref 70–99)
Potassium: 3.5 mmol/L (ref 3.5–5.1)
Sodium: 133 mmol/L — ABNORMAL LOW (ref 135–145)
Total Bilirubin: 0.5 mg/dL (ref 0.3–1.2)
Total Protein: 9.4 g/dL — ABNORMAL HIGH (ref 6.5–8.1)

## 2019-06-27 LAB — CBC WITH DIFFERENTIAL/PLATELET
Abs Immature Granulocytes: 0.01 K/uL (ref 0.00–0.07)
Basophils Absolute: 0 K/uL (ref 0.0–0.1)
Basophils Relative: 0 %
Eosinophils Absolute: 0 K/uL (ref 0.0–0.5)
Eosinophils Relative: 0 %
HCT: 26.7 % — ABNORMAL LOW (ref 36.0–46.0)
Hemoglobin: 8.8 g/dL — ABNORMAL LOW (ref 12.0–15.0)
Immature Granulocytes: 0 %
Lymphocytes Relative: 27 %
Lymphs Abs: 0.8 K/uL (ref 0.7–4.0)
MCH: 30.7 pg (ref 26.0–34.0)
MCHC: 33 g/dL (ref 30.0–36.0)
MCV: 93 fL (ref 80.0–100.0)
Monocytes Absolute: 0.2 K/uL (ref 0.1–1.0)
Monocytes Relative: 7 %
Neutro Abs: 2 K/uL (ref 1.7–7.7)
Neutrophils Relative %: 66 %
Platelets: 132 K/uL — ABNORMAL LOW (ref 150–400)
RBC: 2.87 MIL/uL — ABNORMAL LOW (ref 3.87–5.11)
RDW: 20.3 % — ABNORMAL HIGH (ref 11.5–15.5)
WBC: 3 K/uL — ABNORMAL LOW (ref 4.0–10.5)
nRBC: 0 % (ref 0.0–0.2)

## 2019-06-27 LAB — SAMPLE TO BLOOD BANK

## 2019-06-27 MED ORDER — PROCHLORPERAZINE MALEATE 10 MG PO TABS
10.0000 mg | ORAL_TABLET | Freq: Once | ORAL | Status: AC
  Administered 2019-06-27: 10 mg via ORAL

## 2019-06-27 MED ORDER — ACETAMINOPHEN 325 MG PO TABS
650.0000 mg | ORAL_TABLET | Freq: Once | ORAL | Status: AC
  Administered 2019-06-27: 650 mg via ORAL

## 2019-06-27 MED ORDER — DIPHENHYDRAMINE HCL 25 MG PO CAPS
ORAL_CAPSULE | ORAL | Status: AC
  Filled 2019-06-27: qty 1

## 2019-06-27 MED ORDER — BORTEZOMIB CHEMO SQ INJECTION 3.5 MG (2.5MG/ML)
1.0400 mg/m2 | Freq: Once | INTRAMUSCULAR | Status: AC
  Administered 2019-06-27: 1.75 mg via SUBCUTANEOUS
  Filled 2019-06-27: qty 0.7

## 2019-06-27 MED ORDER — DIPHENHYDRAMINE HCL 25 MG PO CAPS
25.0000 mg | ORAL_CAPSULE | Freq: Once | ORAL | Status: AC
  Administered 2019-06-27: 25 mg via ORAL

## 2019-06-27 MED ORDER — DEXAMETHASONE 4 MG PO TABS
20.0000 mg | ORAL_TABLET | Freq: Once | ORAL | Status: AC
  Administered 2019-06-27: 20 mg via ORAL

## 2019-06-27 MED ORDER — PROCHLORPERAZINE MALEATE 10 MG PO TABS
ORAL_TABLET | ORAL | Status: AC
  Filled 2019-06-27: qty 1

## 2019-06-27 MED ORDER — ACETAMINOPHEN 325 MG PO TABS
ORAL_TABLET | ORAL | Status: AC
  Filled 2019-06-27: qty 2

## 2019-06-27 MED ORDER — DEXAMETHASONE 4 MG PO TABS
ORAL_TABLET | ORAL | Status: AC
  Filled 2019-06-27: qty 1

## 2019-06-27 MED ORDER — DEXAMETHASONE 4 MG PO TABS
ORAL_TABLET | ORAL | Status: AC
  Filled 2019-06-27: qty 4

## 2019-06-27 MED ORDER — DARATUMUMAB-HYALURONIDASE-FIHJ 1800-30000 MG-UT/15ML ~~LOC~~ SOLN
1800.0000 mg | Freq: Once | SUBCUTANEOUS | Status: AC
  Administered 2019-06-27: 1800 mg via SUBCUTANEOUS
  Filled 2019-06-27: qty 15

## 2019-06-27 NOTE — Patient Instructions (Signed)
[facility] Discharge Instructions for Patients Receiving Chemotherapy  Today you received the following chemotherapy agents: Velcade and Darzalex FasPro  To help prevent nausea and vomiting after your treatment, we encourage you to take your nausea medication as directed.   If you develop nausea and vomiting that is not controlled by your nausea medication, call the clinic.   BELOW ARE SYMPTOMS THAT SHOULD BE REPORTED IMMEDIATELY:  *FEVER GREATER THAN 100.5 F  *CHILLS WITH OR WITHOUT FEVER  NAUSEA AND VOMITING THAT IS NOT CONTROLLED WITH YOUR NAUSEA MEDICATION  *UNUSUAL SHORTNESS OF BREATH  *UNUSUAL BRUISING OR BLEEDING  TENDERNESS IN MOUTH AND THROAT WITH OR WITHOUT PRESENCE OF ULCERS  *URINARY PROBLEMS  *BOWEL PROBLEMS  UNUSUAL RASH Items with * indicate a potential emergency and should be followed up as soon as possible.  Feel free to call the clinic should you have any questions or concerns. The clinic phone number is [phone].  Please show the [place] at check-in to the Emergency Department and triage nurse.

## 2019-06-27 NOTE — Progress Notes (Signed)
Per Dr. Alvy Bimler, patient OK to treat, with today's labs

## 2019-06-27 NOTE — Progress Notes (Addendum)
Per Dr. Alvy Bimler patient is OK to be d/c with currect BP. Patient is asymptomatic and states she is feeling well.

## 2019-07-01 ENCOUNTER — Other Ambulatory Visit: Payer: Self-pay | Admitting: Hematology and Oncology

## 2019-07-02 DIAGNOSIS — T82858A Stenosis of vascular prosthetic devices, implants and grafts, initial encounter: Secondary | ICD-10-CM | POA: Diagnosis not present

## 2019-07-02 DIAGNOSIS — I871 Compression of vein: Secondary | ICD-10-CM | POA: Diagnosis not present

## 2019-07-02 DIAGNOSIS — N186 End stage renal disease: Secondary | ICD-10-CM | POA: Diagnosis not present

## 2019-07-02 DIAGNOSIS — Z992 Dependence on renal dialysis: Secondary | ICD-10-CM | POA: Diagnosis not present

## 2019-07-03 DIAGNOSIS — Z4802 Encounter for removal of sutures: Secondary | ICD-10-CM | POA: Insufficient documentation

## 2019-07-04 ENCOUNTER — Encounter: Payer: Self-pay | Admitting: Hematology and Oncology

## 2019-07-04 ENCOUNTER — Other Ambulatory Visit: Payer: Self-pay

## 2019-07-04 ENCOUNTER — Inpatient Hospital Stay: Payer: Medicare HMO

## 2019-07-04 ENCOUNTER — Inpatient Hospital Stay (HOSPITAL_BASED_OUTPATIENT_CLINIC_OR_DEPARTMENT_OTHER): Payer: Medicare HMO | Admitting: Hematology and Oncology

## 2019-07-04 VITALS — BP 125/54 | HR 77 | Temp 98.2°F | Resp 17

## 2019-07-04 DIAGNOSIS — Z992 Dependence on renal dialysis: Secondary | ICD-10-CM

## 2019-07-04 DIAGNOSIS — N186 End stage renal disease: Secondary | ICD-10-CM

## 2019-07-04 DIAGNOSIS — D61818 Other pancytopenia: Secondary | ICD-10-CM

## 2019-07-04 DIAGNOSIS — C9000 Multiple myeloma not having achieved remission: Secondary | ICD-10-CM

## 2019-07-04 DIAGNOSIS — Z5111 Encounter for antineoplastic chemotherapy: Secondary | ICD-10-CM | POA: Diagnosis not present

## 2019-07-04 DIAGNOSIS — Z7189 Other specified counseling: Secondary | ICD-10-CM

## 2019-07-04 DIAGNOSIS — T451X5A Adverse effect of antineoplastic and immunosuppressive drugs, initial encounter: Secondary | ICD-10-CM | POA: Diagnosis not present

## 2019-07-04 DIAGNOSIS — G620 Drug-induced polyneuropathy: Secondary | ICD-10-CM

## 2019-07-04 LAB — COMPREHENSIVE METABOLIC PANEL
ALT: 6 U/L (ref 0–44)
AST: 15 U/L (ref 15–41)
Albumin: 1.6 g/dL — ABNORMAL LOW (ref 3.5–5.0)
Alkaline Phosphatase: 47 U/L (ref 38–126)
Anion gap: 12 (ref 5–15)
BUN: 6 mg/dL — ABNORMAL LOW (ref 8–23)
CO2: 29 mmol/L (ref 22–32)
Calcium: 7.2 mg/dL — ABNORMAL LOW (ref 8.9–10.3)
Chloride: 95 mmol/L — ABNORMAL LOW (ref 98–111)
Creatinine, Ser: 2.76 mg/dL — ABNORMAL HIGH (ref 0.44–1.00)
GFR calc Af Amer: 17 mL/min — ABNORMAL LOW (ref >60)
GFR calc non Af Amer: 15 mL/min — ABNORMAL LOW (ref >60)
Glucose, Bld: 90 mg/dL (ref 70–99)
Potassium: 3.2 mmol/L — ABNORMAL LOW (ref 3.5–5.1)
Sodium: 136 mmol/L (ref 135–145)
Total Bilirubin: 0.8 mg/dL (ref 0.3–1.2)
Total Protein: 9.4 g/dL — ABNORMAL HIGH (ref 6.5–8.1)

## 2019-07-04 LAB — CBC WITH DIFFERENTIAL/PLATELET
Abs Immature Granulocytes: 0.01 10*3/uL (ref 0.00–0.07)
Basophils Absolute: 0 10*3/uL (ref 0.0–0.1)
Basophils Relative: 1 %
Eosinophils Absolute: 0 10*3/uL (ref 0.0–0.5)
Eosinophils Relative: 0 %
HCT: 26.8 % — ABNORMAL LOW (ref 36.0–46.0)
Hemoglobin: 8.8 g/dL — ABNORMAL LOW (ref 12.0–15.0)
Immature Granulocytes: 1 %
Lymphocytes Relative: 25 %
Lymphs Abs: 0.5 10*3/uL — ABNORMAL LOW (ref 0.7–4.0)
MCH: 30.8 pg (ref 26.0–34.0)
MCHC: 32.8 g/dL (ref 30.0–36.0)
MCV: 93.7 fL (ref 80.0–100.0)
Monocytes Absolute: 0.2 10*3/uL (ref 0.1–1.0)
Monocytes Relative: 9 %
Neutro Abs: 1.2 10*3/uL — ABNORMAL LOW (ref 1.7–7.7)
Neutrophils Relative %: 64 %
Platelets: 125 10*3/uL — ABNORMAL LOW (ref 150–400)
RBC: 2.86 MIL/uL — ABNORMAL LOW (ref 3.87–5.11)
RDW: 21 % — ABNORMAL HIGH (ref 11.5–15.5)
WBC: 1.8 10*3/uL — ABNORMAL LOW (ref 4.0–10.5)
nRBC: 0 % (ref 0.0–0.2)

## 2019-07-04 LAB — SAMPLE TO BLOOD BANK

## 2019-07-04 MED ORDER — DEXAMETHASONE 4 MG PO TABS: 2.0000 mg | ORAL_TABLET | Freq: Every day | ORAL | 0 refills | Status: DC

## 2019-07-04 MED ORDER — PROCHLORPERAZINE MALEATE 10 MG PO TABS
10.0000 mg | ORAL_TABLET | Freq: Once | ORAL | Status: AC
  Administered 2019-07-04: 10 mg via ORAL

## 2019-07-04 MED ORDER — DEXAMETHASONE 4 MG PO TABS
20.0000 mg | ORAL_TABLET | Freq: Once | ORAL | Status: AC
  Administered 2019-07-04: 20 mg via ORAL

## 2019-07-04 MED ORDER — ACETAMINOPHEN 325 MG PO TABS
650.0000 mg | ORAL_TABLET | Freq: Once | ORAL | Status: AC
  Administered 2019-07-04: 650 mg via ORAL

## 2019-07-04 MED ORDER — ACETAMINOPHEN 325 MG PO TABS
ORAL_TABLET | ORAL | Status: AC
  Filled 2019-07-04: qty 2

## 2019-07-04 MED ORDER — DIPHENHYDRAMINE HCL 25 MG PO CAPS
ORAL_CAPSULE | ORAL | Status: AC
  Filled 2019-07-04: qty 1

## 2019-07-04 MED ORDER — PROCHLORPERAZINE MALEATE 10 MG PO TABS
ORAL_TABLET | ORAL | Status: AC
  Filled 2019-07-04: qty 1

## 2019-07-04 MED ORDER — DIPHENHYDRAMINE HCL 25 MG PO CAPS
25.0000 mg | ORAL_CAPSULE | Freq: Once | ORAL | Status: AC
  Administered 2019-07-04: 25 mg via ORAL

## 2019-07-04 MED ORDER — DARATUMUMAB-HYALURONIDASE-FIHJ 1800-30000 MG-UT/15ML ~~LOC~~ SOLN
1800.0000 mg | Freq: Once | SUBCUTANEOUS | Status: AC
  Administered 2019-07-04: 1800 mg via SUBCUTANEOUS
  Filled 2019-07-04: qty 15

## 2019-07-04 MED ORDER — DEXAMETHASONE 4 MG PO TABS
ORAL_TABLET | ORAL | Status: AC
  Filled 2019-07-04: qty 5

## 2019-07-04 MED ORDER — BORTEZOMIB CHEMO SQ INJECTION 3.5 MG (2.5MG/ML)
0.7800 mg/m2 | Freq: Once | INTRAMUSCULAR | Status: AC
  Administered 2019-07-04: 1.25 mg via SUBCUTANEOUS
  Filled 2019-07-04: qty 0.5

## 2019-07-04 NOTE — Assessment & Plan Note (Signed)
she has mild peripheral neuropathy, likely related to side effects of treatment. °I plan to reduce the dose of treatment as outlined above.  °I explained to the patient the rationale of this strategy and reassured the patient it would not compromise the efficacy of treatment ° °

## 2019-07-04 NOTE — Progress Notes (Signed)
Per Dr. Alvy Bimler patient will have a 1 hour observation time after Darzalex injection. Patient aware.

## 2019-07-04 NOTE — Progress Notes (Signed)
[place] OFFICE PROGRESS NOTE  Patient Care Team: Lucianne Lei, MD as PCP - General (Family Medicine)  ASSESSMENT & PLAN:  Multiple myeloma not having achieved remission [facility]) She tolerated the addition of subcutaneous daratumumab well without major side effects The total serum protein has already improved Due to neuropathy, I plan to reduce the dose of Velcade further We will continue myeloma panel once a month I recommend reducing dexamethasone to 2 mg every other day, for further dose reduction once we have confirmation that she is responding well to treatment  Pancytopenia, acquired [facility]) She has severe pancytopenia secondary to side effects of treatment We will proceed with dose adjustment of Velcade There is no dose adjustment needed for subcutaneous daratumumab Her nephrologist is managing her anemia She does not need transfusion support  ESRD (end stage renal disease) on dialysis [facility]) I had discussed the case with her nephrologist She will continue hemodialysis on Tuesdays, Thursdays and Saturdays as scheduled We discussed potential port placement for more aggressive chemotherapy but due to her plan for long-term dialysis, at this point, we will have to hold off port placement Neither daratumumab nor Velcade needs dose adjustment in the setting of renal failure  Peripheral neuropathy due to chemotherapy [facility]) she has mild peripheral neuropathy, likely related to side effects of treatment. I plan to reduce the dose of treatment as outlined above.  I explained to the patient the rationale of this strategy and reassured the patient it would not compromise the efficacy of treatment    No orders of the defined types were placed in this encounter.   INTERVAL HISTORY: Please see below for problem oriented charting. She returns for further follow-up She tolerated recent treatment well except for mild worsening neuropathy No new bone pain Appetite is  stable No recent infection, fever or chills Denies infusion reaction  SUMMARY OF ONCOLOGIC HISTORY: Oncology History  Multiple myeloma not having achieved remission ([place])  03/03/2019 Initial Diagnosis   Multiple myeloma not having achieved remission ([place])   03/03/2019 Imaging   1. Round and oval lucent/lytic lesions in the skull, bilateral humeri, distal left femur, and possibly also in the pelvis are compatible with multiple myeloma. No right lower extremity lytic lesion identified. No pathologic fracture. 2. Advanced degenerative changes in the spine with no vertebral myeloma evident by plain radiography. 3. Advanced degenerative changes in the right upper extremity. Bilateral knee arthroplasty. 4.  Aortic Atherosclerosis (ICD10-I70.0).   03/04/2019 -  Chemotherapy   The patient had bortezomib for chemotherapy treatment.    03/04/2019 Bone Marrow Biopsy   Bone Marrow, Aspirate,Biopsy, and Clot - HYPERCELLULAR BONE MARROW FOR AGE WITH PLASMACYTOSIS. - SEE COMMENT. PERIPHERAL BLOOD: - NORMOCYTIC-NORMOCHROMIC ANEMIA. Diagnosis Note The bone marrow is hypercellular for age with increased number of atypical plasma cells averaging 30% of all cells in the aspirate associated with interstitial infiltrates and numerous variably sized aggregates in the clot and biopsy sections. 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 Confirmatory in situ hybridization for kappa and lambda as well as CD138 will be performed and the results reported in an addendum. The background shows trilineage hematopoiesis with mild non-specific changes primarily involving the erythroid series. Correlation with cytogenetic and FISH studies is recommended.    06/20/2019 -  Chemotherapy   The patient had daratumumab-hyaluronidase-fihj (DARZALEX FASPRO) 1800-30000 MG-UT/15ML chemo SQ injection 1,800 mg, 1,800 mg, Subcutaneous,  Once, 1 of 10 cycles Administration: 1,800 mg (06/20/2019), 1,800 mg (06/27/2019)  for  chemotherapy treatment.  REVIEW OF SYSTEMS:   Constitutional: Denies fevers, chills or abnormal weight loss Eyes: Denies blurriness of vision Ears, nose, mouth, throat, and face: Denies mucositis or sore throat Respiratory: Denies cough, dyspnea or wheezes Cardiovascular: Denies palpitation, chest discomfort or lower extremity swelling Gastrointestinal:  Denies nausea, heartburn or change in bowel habits Skin: Denies abnormal skin rashes Lymphatics: Denies new lymphadenopathy or easy bruising Behavioral/Psych: Mood is stable, no new changes  All other systems were reviewed with the patient and are negative.  I have reviewed the past medical history, past surgical history, social history and family history with the patient and they are unchanged from previous note.  ALLERGIES:  is allergic to aspirin; hydrocodone-acetaminophen; morphine; penicillins; brimonidine; diltiazem hcl; hydrocodone-acetaminophen; and neurontin [gabapentin].  MEDICATIONS:  Current Outpatient Medications  Medication Sig Dispense Refill  . acetaminophen (TYLENOL) 500 MG tablet Take 500 mg by mouth 2 (two) times daily as needed for moderate pain.    Marland Kitchen acyclovir (ZOVIRAX) 400 MG tablet Take 1 tablet (400 mg total) by mouth daily. 30 tablet 6  . allopurinol (ZYLOPRIM) 100 MG tablet Take 0.5 tablets (50 mg total) by mouth daily. 30 tablet 0  . B Complex-C-Zn-Folic Acid (DIALYVITE 161-WRUE 15) 0.8 MG TABS Take 1 tablet by mouth every evening.     Marland Kitchen dexamethasone (DECADRON) 4 MG tablet Take 0.5 tablets (2 mg total) by mouth daily. 30 tablet 0  . dorzolamide (TRUSOPT) 2 % ophthalmic solution Place 1 drop into the left eye every evening.   1  . ethyl chloride spray Apply 1 application topically as needed (prior to port being accessed).     . fluorometholone (FML) 0.1 % ophthalmic suspension Place 1 drop into the right eye 3 (three) times daily.     . isosorbide-hydrALAZINE (BIDIL) 20-37.5 MG tablet Take 1 tablet by  mouth 2 (two) times a day. 60 tablet 0  . lactulose (CHRONULAC) 10 GM/15ML solution TAKE 15 MLS (10 G TOTAL) BY MOUTH 2 (TWO) TIMES DAILY. 473 mL 0  . LUMIGAN 0.01 % SOLN Place 1 drop into the left eye every evening.     Marland Kitchen LYRICA 50 MG capsule Take 50 mg by mouth at bedtime as needed (pain/neuropathy).     . ondansetron (ZOFRAN) 4 MG tablet Take 1 tablet (4 mg total) by mouth every 8 (eight) hours as needed for nausea. 30 tablet 1  . pantoprazole (PROTONIX) 40 MG tablet Take 1 tablet (40 mg total) by mouth daily as needed (acid reflux/indigestion.). 30 tablet 11  . polyethylene glycol (MIRALAX / GLYCOLAX) 17 g packet Take 17 g by mouth daily as needed (constipation).    . prochlorperazine (COMPAZINE) 5 MG tablet Take 1 tablet (5 mg total) by mouth every 6 (six) hours as needed for nausea or vomiting. 30 tablet 1  . senna-docusate (SENOKOT-S) 8.6-50 MG tablet Take 2 tablets by mouth 2 (two) times daily. (Patient taking differently: Take 2 tablets by mouth 2 (two) times daily as needed (constipation.). ) 30 tablet 0  . sevelamer carbonate (RENVELA) 800 MG tablet Take 800-1,200 mg by mouth daily.     . sodium chloride (OCEAN) 0.65 % SOLN nasal spray Place 1 spray into both nostrils 4 (four) times daily as needed for congestion.      No current facility-administered medications for this visit.     PHYSICAL EXAMINATION: ECOG PERFORMANCE STATUS: 2 - Symptomatic, <50% confined to bed  Vitals:   07/04/19 1016  BP: (!) 124/46  Pulse: 90  Resp: 17  Temp: 98.3 F (36.8 C)  SpO2: 98%   Filed Weights   07/04/19 1016  Weight: 137 lb 11.2 oz (62.5 kg)    GENERAL:alert, no distress and comfortable SKIN: skin color, texture, turgor are normal, no rashes or significant lesions EYES: normal, Conjunctiva are pink and non-injected, sclera clear OROPHARYNX:no exudate, no erythema and lips, buccal mucosa, and tongue normal  NECK: supple, thyroid normal size, non-tender, without nodularity LYMPH:  no  palpable lymphadenopathy in the cervical, axillary or inguinal LUNGS: clear to auscultation and percussion with normal breathing effort HEART: regular rate & rhythm and no murmurs and no lower extremity edema ABDOMEN:abdomen soft, non-tender and normal bowel sounds Musculoskeletal:no cyanosis of digits and no clubbing  NEURO: alert & oriented x 3 with fluent speech, no focal motor/sensory deficits  LABORATORY DATA:  I have reviewed the data as listed    Component Value Date/Time   NA 136 07/04/2019 0949   K 3.2 (L) 07/04/2019 0949   CL 95 (L) 07/04/2019 0949   CO2 29 07/04/2019 0949   GLUCOSE 90 07/04/2019 0949   BUN 6 (L) 07/04/2019 0949   CREATININE 2.76 (H) 07/04/2019 0949   CREATININE 1.03 (H) 05/11/2016 0931   CALCIUM 7.2 (L) 07/04/2019 0949   PROT 9.4 (H) 07/04/2019 0949   ALBUMIN 1.6 (L) 07/04/2019 0949   AST 15 07/04/2019 0949   ALT 6 07/04/2019 0949   ALKPHOS 47 07/04/2019 0949   BILITOT 0.8 07/04/2019 0949   GFRNONAA 15 (L) 07/04/2019 0949   GFRAA 17 (L) 07/04/2019 0949    No results found for: SPEP, UPEP  Lab Results  Component Value Date   WBC 1.8 (L) 07/04/2019   NEUTROABS 1.2 (L) 07/04/2019   HGB 8.8 (L) 07/04/2019   HCT 26.8 (L) 07/04/2019   MCV 93.7 07/04/2019   PLT 125 (L) 07/04/2019      Chemistry      Component Value Date/Time   NA 136 07/04/2019 0949   K 3.2 (L) 07/04/2019 0949   CL 95 (L) 07/04/2019 0949   CO2 29 07/04/2019 0949   BUN 6 (L) 07/04/2019 0949   CREATININE 2.76 (H) 07/04/2019 0949   CREATININE 1.03 (H) 05/11/2016 0931      Component Value Date/Time   CALCIUM 7.2 (L) 07/04/2019 0949   ALKPHOS 47 07/04/2019 0949   AST 15 07/04/2019 0949   ALT 6 07/04/2019 0949   BILITOT 0.8 07/04/2019 0949      All questions were answered. The patient knows to call the clinic with any problems, questions or concerns. No barriers to learning was detected.  I spent 25 minutes counseling the patient face to face. The total time spent in the  appointment was 30 minutes and more than 50% was on counseling and review of test results  Heath Lark, MD 07/04/2019 1:37 PM

## 2019-07-04 NOTE — Progress Notes (Signed)
Per Dr. Alvy Bimler, labs reviewed today and ok to proceed with treatment velcade and daratumumab.

## 2019-07-04 NOTE — Assessment & Plan Note (Signed)
I had discussed the case with her nephrologist She will continue hemodialysis on Tuesdays, Thursdays and Saturdays as scheduled We discussed potential port placement for more aggressive chemotherapy but due to her plan for long-term dialysis, at this point, we will have to hold off port placement Neither daratumumab nor Velcade needs dose adjustment in the setting of renal failure

## 2019-07-04 NOTE — Patient Instructions (Addendum)
[facility] Discharge Instructions for Patients Receiving Chemotherapy  Today you received the following chemotherapy agents: Velcade, Darzalex  To help prevent nausea and vomiting after your treatment, we encourage you to take your nausea medication as directed.   If you develop nausea and vomiting that is not controlled by your nausea medication, call the clinic.   BELOW ARE SYMPTOMS THAT SHOULD BE REPORTED IMMEDIATELY:  *FEVER GREATER THAN 100.5 F  *CHILLS WITH OR WITHOUT FEVER  NAUSEA AND VOMITING THAT IS NOT CONTROLLED WITH YOUR NAUSEA MEDICATION  *UNUSUAL SHORTNESS OF BREATH  *UNUSUAL BRUISING OR BLEEDING  TENDERNESS IN MOUTH AND THROAT WITH OR WITHOUT PRESENCE OF ULCERS  *URINARY PROBLEMS  *BOWEL PROBLEMS  UNUSUAL RASH Items with * indicate a potential emergency and should be followed up as soon as possible.  Feel free to call the clinic should you have any questions or concerns. The clinic phone number is [phone].  Please show the CHEMO ALERT CARD at check-in to the Emergency Department and triage nurse.   

## 2019-07-04 NOTE — Assessment & Plan Note (Signed)
She tolerated the addition of subcutaneous daratumumab well without major side effects The total serum protein has already improved Due to neuropathy, I plan to reduce the dose of Velcade further We will continue myeloma panel once a month I recommend reducing dexamethasone to 2 mg every other day, for further dose reduction once we have confirmation that she is responding well to treatment

## 2019-07-04 NOTE — Assessment & Plan Note (Signed)
She has severe pancytopenia secondary to side effects of treatment We will proceed with dose adjustment of Velcade There is no dose adjustment needed for subcutaneous daratumumab Her nephrologist is managing her anemia She does not need transfusion support

## 2019-07-07 ENCOUNTER — Telehealth: Payer: Self-pay | Admitting: Hematology and Oncology

## 2019-07-07 NOTE — Telephone Encounter (Signed)
I talk with patient son he said he received schedule

## 2019-07-11 ENCOUNTER — Inpatient Hospital Stay: Payer: Medicare HMO

## 2019-07-11 ENCOUNTER — Other Ambulatory Visit: Payer: Self-pay

## 2019-07-11 VITALS — BP 130/65 | HR 92 | Temp 98.5°F | Resp 18

## 2019-07-11 DIAGNOSIS — C9000 Multiple myeloma not having achieved remission: Secondary | ICD-10-CM

## 2019-07-11 DIAGNOSIS — Z5111 Encounter for antineoplastic chemotherapy: Secondary | ICD-10-CM | POA: Diagnosis not present

## 2019-07-11 DIAGNOSIS — Z7189 Other specified counseling: Secondary | ICD-10-CM

## 2019-07-11 LAB — CBC WITH DIFFERENTIAL/PLATELET
Abs Immature Granulocytes: 0.01 10*3/uL (ref 0.00–0.07)
Basophils Absolute: 0 10*3/uL (ref 0.0–0.1)
Basophils Relative: 0 %
Eosinophils Absolute: 0 10*3/uL (ref 0.0–0.5)
Eosinophils Relative: 1 %
HCT: 27.3 % — ABNORMAL LOW (ref 36.0–46.0)
Hemoglobin: 9 g/dL — ABNORMAL LOW (ref 12.0–15.0)
Immature Granulocytes: 1 %
Lymphocytes Relative: 33 %
Lymphs Abs: 0.6 10*3/uL — ABNORMAL LOW (ref 0.7–4.0)
MCH: 31.7 pg (ref 26.0–34.0)
MCHC: 33 g/dL (ref 30.0–36.0)
MCV: 96.1 fL (ref 80.0–100.0)
Monocytes Absolute: 0.2 10*3/uL (ref 0.1–1.0)
Monocytes Relative: 10 %
Neutro Abs: 1 10*3/uL — ABNORMAL LOW (ref 1.7–7.7)
Neutrophils Relative %: 55 %
Platelets: 118 10*3/uL — ABNORMAL LOW (ref 150–400)
RBC: 2.84 MIL/uL — ABNORMAL LOW (ref 3.87–5.11)
RDW: 21.2 % — ABNORMAL HIGH (ref 11.5–15.5)
WBC: 1.9 10*3/uL — ABNORMAL LOW (ref 4.0–10.5)
nRBC: 0 % (ref 0.0–0.2)

## 2019-07-11 LAB — COMPREHENSIVE METABOLIC PANEL
ALT: <6 U/L (ref 0–44)
AST: 16 U/L (ref 15–41)
Albumin: 1.6 g/dL — ABNORMAL LOW (ref 3.5–5.0)
Alkaline Phosphatase: 49 U/L (ref 38–126)
Anion gap: 12 (ref 5–15)
BUN: 5 mg/dL — ABNORMAL LOW (ref 8–23)
CO2: 26 mmol/L (ref 22–32)
Calcium: 7.3 mg/dL — ABNORMAL LOW (ref 8.9–10.3)
Chloride: 98 mmol/L (ref 98–111)
Creatinine, Ser: 2.25 mg/dL — ABNORMAL HIGH (ref 0.44–1.00)
GFR calc Af Amer: 22 mL/min — ABNORMAL LOW (ref >60)
GFR calc non Af Amer: 19 mL/min — ABNORMAL LOW (ref >60)
Glucose, Bld: 84 mg/dL (ref 70–99)
Potassium: 3.7 mmol/L (ref 3.5–5.1)
Sodium: 136 mmol/L (ref 135–145)
Total Bilirubin: 0.8 mg/dL (ref 0.3–1.2)
Total Protein: 9 g/dL — ABNORMAL HIGH (ref 6.5–8.1)

## 2019-07-11 MED ORDER — DIPHENHYDRAMINE HCL 25 MG PO CAPS
ORAL_CAPSULE | ORAL | Status: AC
  Filled 2019-07-11: qty 1

## 2019-07-11 MED ORDER — DEXAMETHASONE 4 MG PO TABS
ORAL_TABLET | ORAL | Status: AC
  Filled 2019-07-11: qty 5

## 2019-07-11 MED ORDER — ACETAMINOPHEN 325 MG PO TABS
650.0000 mg | ORAL_TABLET | Freq: Once | ORAL | Status: AC
  Administered 2019-07-11: 650 mg via ORAL

## 2019-07-11 MED ORDER — BORTEZOMIB CHEMO SQ INJECTION 3.5 MG (2.5MG/ML)
0.7800 mg/m2 | Freq: Once | INTRAMUSCULAR | Status: AC
  Administered 2019-07-11: 1.25 mg via SUBCUTANEOUS
  Filled 2019-07-11: qty 0.5

## 2019-07-11 MED ORDER — DARATUMUMAB-HYALURONIDASE-FIHJ 1800-30000 MG-UT/15ML ~~LOC~~ SOLN
1800.0000 mg | Freq: Once | SUBCUTANEOUS | Status: AC
  Administered 2019-07-11: 1800 mg via SUBCUTANEOUS
  Filled 2019-07-11: qty 15

## 2019-07-11 MED ORDER — ACETAMINOPHEN 325 MG PO TABS
ORAL_TABLET | ORAL | Status: AC
  Filled 2019-07-11: qty 2

## 2019-07-11 MED ORDER — DIPHENHYDRAMINE HCL 25 MG PO CAPS
25.0000 mg | ORAL_CAPSULE | Freq: Once | ORAL | Status: AC
  Administered 2019-07-11: 25 mg via ORAL

## 2019-07-11 MED ORDER — PROCHLORPERAZINE MALEATE 10 MG PO TABS
ORAL_TABLET | ORAL | Status: AC
  Filled 2019-07-11: qty 1

## 2019-07-11 MED ORDER — DEXAMETHASONE 4 MG PO TABS
20.0000 mg | ORAL_TABLET | Freq: Once | ORAL | Status: AC
  Administered 2019-07-11: 20 mg via ORAL

## 2019-07-11 MED ORDER — PROCHLORPERAZINE MALEATE 10 MG PO TABS
10.0000 mg | ORAL_TABLET | Freq: Once | ORAL | Status: AC
  Administered 2019-07-11: 10 mg via ORAL

## 2019-07-16 DIAGNOSIS — T82858A Stenosis of vascular prosthetic devices, implants and grafts, initial encounter: Secondary | ICD-10-CM | POA: Diagnosis not present

## 2019-07-16 DIAGNOSIS — Z992 Dependence on renal dialysis: Secondary | ICD-10-CM | POA: Diagnosis not present

## 2019-07-16 DIAGNOSIS — N186 End stage renal disease: Secondary | ICD-10-CM | POA: Diagnosis not present

## 2019-07-16 DIAGNOSIS — E1122 Type 2 diabetes mellitus with diabetic chronic kidney disease: Secondary | ICD-10-CM | POA: Diagnosis not present

## 2019-07-16 DIAGNOSIS — I871 Compression of vein: Secondary | ICD-10-CM | POA: Diagnosis not present

## 2019-07-17 DIAGNOSIS — E0822 Diabetes mellitus due to underlying condition with diabetic chronic kidney disease: Secondary | ICD-10-CM | POA: Diagnosis not present

## 2019-07-17 DIAGNOSIS — N186 End stage renal disease: Secondary | ICD-10-CM | POA: Diagnosis not present

## 2019-07-17 DIAGNOSIS — D689 Coagulation defect, unspecified: Secondary | ICD-10-CM | POA: Diagnosis not present

## 2019-07-17 DIAGNOSIS — E876 Hypokalemia: Secondary | ICD-10-CM | POA: Diagnosis not present

## 2019-07-17 DIAGNOSIS — Z992 Dependence on renal dialysis: Secondary | ICD-10-CM | POA: Diagnosis not present

## 2019-07-17 DIAGNOSIS — D631 Anemia in chronic kidney disease: Secondary | ICD-10-CM | POA: Diagnosis not present

## 2019-07-17 DIAGNOSIS — N2581 Secondary hyperparathyroidism of renal origin: Secondary | ICD-10-CM | POA: Diagnosis not present

## 2019-07-17 DIAGNOSIS — D509 Iron deficiency anemia, unspecified: Secondary | ICD-10-CM | POA: Diagnosis not present

## 2019-07-18 ENCOUNTER — Other Ambulatory Visit: Payer: Self-pay

## 2019-07-18 ENCOUNTER — Inpatient Hospital Stay: Payer: Medicare HMO

## 2019-07-18 ENCOUNTER — Inpatient Hospital Stay: Payer: Medicare HMO | Attending: Hematology and Oncology

## 2019-07-18 VITALS — BP 111/52 | HR 75 | Temp 98.5°F | Resp 18

## 2019-07-18 DIAGNOSIS — G620 Drug-induced polyneuropathy: Secondary | ICD-10-CM | POA: Insufficient documentation

## 2019-07-18 DIAGNOSIS — Z7189 Other specified counseling: Secondary | ICD-10-CM

## 2019-07-18 DIAGNOSIS — T451X5A Adverse effect of antineoplastic and immunosuppressive drugs, initial encounter: Secondary | ICD-10-CM | POA: Diagnosis not present

## 2019-07-18 DIAGNOSIS — C9000 Multiple myeloma not having achieved remission: Secondary | ICD-10-CM | POA: Insufficient documentation

## 2019-07-18 DIAGNOSIS — Z5111 Encounter for antineoplastic chemotherapy: Secondary | ICD-10-CM | POA: Insufficient documentation

## 2019-07-18 DIAGNOSIS — Z79899 Other long term (current) drug therapy: Secondary | ICD-10-CM | POA: Insufficient documentation

## 2019-07-18 DIAGNOSIS — D61818 Other pancytopenia: Secondary | ICD-10-CM | POA: Diagnosis not present

## 2019-07-18 LAB — CBC WITH DIFFERENTIAL/PLATELET
Abs Immature Granulocytes: 0 10*3/uL (ref 0.00–0.07)
Basophils Absolute: 0 10*3/uL (ref 0.0–0.1)
Basophils Relative: 0 %
Eosinophils Absolute: 0 10*3/uL (ref 0.0–0.5)
Eosinophils Relative: 0 %
HCT: 28.5 % — ABNORMAL LOW (ref 36.0–46.0)
Hemoglobin: 9.5 g/dL — ABNORMAL LOW (ref 12.0–15.0)
Immature Granulocytes: 0 %
Lymphocytes Relative: 40 %
Lymphs Abs: 1 10*3/uL (ref 0.7–4.0)
MCH: 31.8 pg (ref 26.0–34.0)
MCHC: 33.3 g/dL (ref 30.0–36.0)
MCV: 95.3 fL (ref 80.0–100.0)
Monocytes Absolute: 0.2 10*3/uL (ref 0.1–1.0)
Monocytes Relative: 9 %
Neutro Abs: 1.2 10*3/uL — ABNORMAL LOW (ref 1.7–7.7)
Neutrophils Relative %: 51 %
Platelets: 152 10*3/uL (ref 150–400)
RBC: 2.99 MIL/uL — ABNORMAL LOW (ref 3.87–5.11)
RDW: 21 % — ABNORMAL HIGH (ref 11.5–15.5)
WBC: 2.4 10*3/uL — ABNORMAL LOW (ref 4.0–10.5)
nRBC: 0 % (ref 0.0–0.2)

## 2019-07-18 LAB — COMPREHENSIVE METABOLIC PANEL
ALT: 6 U/L (ref 0–44)
AST: 14 U/L — ABNORMAL LOW (ref 15–41)
Albumin: 1.7 g/dL — ABNORMAL LOW (ref 3.5–5.0)
Alkaline Phosphatase: 55 U/L (ref 38–126)
Anion gap: 11 (ref 5–15)
BUN: 6 mg/dL — ABNORMAL LOW (ref 8–23)
CO2: 31 mmol/L (ref 22–32)
Calcium: 7.4 mg/dL — ABNORMAL LOW (ref 8.9–10.3)
Chloride: 94 mmol/L — ABNORMAL LOW (ref 98–111)
Creatinine, Ser: 2.42 mg/dL — ABNORMAL HIGH (ref 0.44–1.00)
GFR calc Af Amer: 20 mL/min — ABNORMAL LOW (ref >60)
GFR calc non Af Amer: 18 mL/min — ABNORMAL LOW (ref >60)
Glucose, Bld: 99 mg/dL (ref 70–99)
Potassium: 3.2 mmol/L — ABNORMAL LOW (ref 3.5–5.1)
Sodium: 136 mmol/L (ref 135–145)
Total Bilirubin: 0.6 mg/dL (ref 0.3–1.2)
Total Protein: 8.9 g/dL — ABNORMAL HIGH (ref 6.5–8.1)

## 2019-07-18 MED ORDER — DIPHENHYDRAMINE HCL 25 MG PO CAPS
ORAL_CAPSULE | ORAL | Status: AC
  Filled 2019-07-18: qty 1

## 2019-07-18 MED ORDER — ACETAMINOPHEN 325 MG PO TABS
ORAL_TABLET | ORAL | Status: AC
  Filled 2019-07-18: qty 2

## 2019-07-18 MED ORDER — PROCHLORPERAZINE MALEATE 10 MG PO TABS
10.0000 mg | ORAL_TABLET | Freq: Once | ORAL | Status: AC
  Administered 2019-07-18: 10 mg via ORAL

## 2019-07-18 MED ORDER — DEXAMETHASONE 4 MG PO TABS
20.0000 mg | ORAL_TABLET | Freq: Once | ORAL | Status: AC
  Administered 2019-07-18: 20 mg via ORAL

## 2019-07-18 MED ORDER — DARATUMUMAB-HYALURONIDASE-FIHJ 1800-30000 MG-UT/15ML ~~LOC~~ SOLN
1800.0000 mg | Freq: Once | SUBCUTANEOUS | Status: AC
  Administered 2019-07-18: 1800 mg via SUBCUTANEOUS
  Filled 2019-07-18: qty 15

## 2019-07-18 MED ORDER — DEXAMETHASONE 4 MG PO TABS
ORAL_TABLET | ORAL | Status: AC
  Filled 2019-07-18: qty 5

## 2019-07-18 MED ORDER — DIPHENHYDRAMINE HCL 25 MG PO CAPS
25.0000 mg | ORAL_CAPSULE | Freq: Once | ORAL | Status: AC
  Administered 2019-07-18: 25 mg via ORAL

## 2019-07-18 MED ORDER — PROCHLORPERAZINE MALEATE 10 MG PO TABS
ORAL_TABLET | ORAL | Status: AC
  Filled 2019-07-18: qty 1

## 2019-07-18 MED ORDER — BORTEZOMIB CHEMO SQ INJECTION 3.5 MG (2.5MG/ML)
0.7800 mg/m2 | Freq: Once | INTRAMUSCULAR | Status: AC
  Administered 2019-07-18: 1.25 mg via SUBCUTANEOUS
  Filled 2019-07-18: qty 0.5

## 2019-07-18 MED ORDER — ACETAMINOPHEN 325 MG PO TABS
650.0000 mg | ORAL_TABLET | Freq: Once | ORAL | Status: AC
  Administered 2019-07-18: 650 mg via ORAL

## 2019-07-18 NOTE — Patient Instructions (Signed)
[facility] Discharge Instructions for Patients Receiving Chemotherapy  Today you received the following chemotherapy agents: Velcade, Darzalex  To help prevent nausea and vomiting after your treatment, we encourage you to take your nausea medication as directed.   If you develop nausea and vomiting that is not controlled by your nausea medication, call the clinic.   BELOW ARE SYMPTOMS THAT SHOULD BE REPORTED IMMEDIATELY:  *FEVER GREATER THAN 100.5 F  *CHILLS WITH OR WITHOUT FEVER  NAUSEA AND VOMITING THAT IS NOT CONTROLLED WITH YOUR NAUSEA MEDICATION  *UNUSUAL SHORTNESS OF BREATH  *UNUSUAL BRUISING OR BLEEDING  TENDERNESS IN MOUTH AND THROAT WITH OR WITHOUT PRESENCE OF ULCERS  *URINARY PROBLEMS  *BOWEL PROBLEMS  UNUSUAL RASH Items with * indicate a potential emergency and should be followed up as soon as possible.  Feel free to call the clinic should you have any questions or concerns. The clinic phone number is [phone].  Please show the CHEMO ALERT CARD at check-in to the Emergency Department and triage nurse.   

## 2019-07-21 DIAGNOSIS — T829XXA Unspecified complication of cardiac and vascular prosthetic device, implant and graft, initial encounter: Secondary | ICD-10-CM | POA: Insufficient documentation

## 2019-07-21 LAB — MULTIPLE MYELOMA PANEL, SERUM
Albumin SerPl Elph-Mcnc: 2.6 g/dL — ABNORMAL LOW (ref 2.9–4.4)
Albumin/Glob SerPl: 0.5 — ABNORMAL LOW (ref 0.7–1.7)
Alpha 1: 0.3 g/dL (ref 0.0–0.4)
Alpha2 Glob SerPl Elph-Mcnc: 0.5 g/dL (ref 0.4–1.0)
B-Globulin SerPl Elph-Mcnc: 5.1 g/dL — ABNORMAL HIGH (ref 0.7–1.3)
Gamma Glob SerPl Elph-Mcnc: 0.2 g/dL — ABNORMAL LOW (ref 0.4–1.8)
Globulin, Total: 6.1 g/dL — ABNORMAL HIGH (ref 2.2–3.9)
IgA: 16 mg/dL — ABNORMAL LOW (ref 64–422)
IgG (Immunoglobin G), Serum: 5441 mg/dL — ABNORMAL HIGH (ref 586–1602)
IgM (Immunoglobulin M), Srm: 8 mg/dL — ABNORMAL LOW (ref 26–217)
M Protein SerPl Elph-Mcnc: 4.4 g/dL — ABNORMAL HIGH
Total Protein ELP: 8.7 g/dL — ABNORMAL HIGH (ref 6.0–8.5)

## 2019-07-21 LAB — KAPPA/LAMBDA LIGHT CHAINS
Kappa free light chain: 1910.6 mg/L — ABNORMAL HIGH (ref 3.3–19.4)
Kappa, lambda light chain ratio: 212.29 — ABNORMAL HIGH (ref 0.26–1.65)
Lambda free light chains: 9 mg/L (ref 5.7–26.3)

## 2019-07-23 DIAGNOSIS — H25813 Combined forms of age-related cataract, bilateral: Secondary | ICD-10-CM | POA: Diagnosis not present

## 2019-07-23 DIAGNOSIS — H4043X3 Glaucoma secondary to eye inflammation, bilateral, severe stage: Secondary | ICD-10-CM | POA: Diagnosis not present

## 2019-07-23 DIAGNOSIS — E119 Type 2 diabetes mellitus without complications: Secondary | ICD-10-CM | POA: Diagnosis not present

## 2019-07-24 ENCOUNTER — Encounter: Payer: Self-pay | Admitting: Hematology and Oncology

## 2019-07-25 ENCOUNTER — Other Ambulatory Visit: Payer: Self-pay | Admitting: Hematology and Oncology

## 2019-07-25 ENCOUNTER — Inpatient Hospital Stay: Payer: Medicare HMO

## 2019-07-25 ENCOUNTER — Inpatient Hospital Stay (HOSPITAL_BASED_OUTPATIENT_CLINIC_OR_DEPARTMENT_OTHER): Payer: Medicare HMO | Admitting: Hematology and Oncology

## 2019-07-25 ENCOUNTER — Other Ambulatory Visit: Payer: Self-pay

## 2019-07-25 ENCOUNTER — Encounter: Payer: Self-pay | Admitting: Hematology and Oncology

## 2019-07-25 VITALS — HR 97

## 2019-07-25 DIAGNOSIS — G620 Drug-induced polyneuropathy: Secondary | ICD-10-CM

## 2019-07-25 DIAGNOSIS — C9000 Multiple myeloma not having achieved remission: Secondary | ICD-10-CM

## 2019-07-25 DIAGNOSIS — D61818 Other pancytopenia: Secondary | ICD-10-CM

## 2019-07-25 DIAGNOSIS — T451X5A Adverse effect of antineoplastic and immunosuppressive drugs, initial encounter: Secondary | ICD-10-CM

## 2019-07-25 DIAGNOSIS — Z5111 Encounter for antineoplastic chemotherapy: Secondary | ICD-10-CM | POA: Diagnosis not present

## 2019-07-25 DIAGNOSIS — T782XXS Anaphylactic shock, unspecified, sequela: Secondary | ICD-10-CM | POA: Insufficient documentation

## 2019-07-25 DIAGNOSIS — Z7189 Other specified counseling: Secondary | ICD-10-CM

## 2019-07-25 DIAGNOSIS — Z79899 Other long term (current) drug therapy: Secondary | ICD-10-CM | POA: Diagnosis not present

## 2019-07-25 LAB — CBC WITH DIFFERENTIAL/PLATELET
Abs Immature Granulocytes: 0 10*3/uL (ref 0.00–0.07)
Basophils Absolute: 0 10*3/uL (ref 0.0–0.1)
Basophils Relative: 0 %
Eosinophils Absolute: 0 10*3/uL (ref 0.0–0.5)
Eosinophils Relative: 0 %
HCT: 27.4 % — ABNORMAL LOW (ref 36.0–46.0)
Hemoglobin: 9.1 g/dL — ABNORMAL LOW (ref 12.0–15.0)
Immature Granulocytes: 0 %
Lymphocytes Relative: 35 %
Lymphs Abs: 0.8 10*3/uL (ref 0.7–4.0)
MCH: 31.5 pg (ref 26.0–34.0)
MCHC: 33.2 g/dL (ref 30.0–36.0)
MCV: 94.8 fL (ref 80.0–100.0)
Monocytes Absolute: 0.2 10*3/uL (ref 0.1–1.0)
Monocytes Relative: 9 %
Neutro Abs: 1.2 10*3/uL — ABNORMAL LOW (ref 1.7–7.7)
Neutrophils Relative %: 56 %
Platelets: 136 10*3/uL — ABNORMAL LOW (ref 150–400)
RBC: 2.89 MIL/uL — ABNORMAL LOW (ref 3.87–5.11)
RDW: 21.4 % — ABNORMAL HIGH (ref 11.5–15.5)
WBC: 2.2 10*3/uL — ABNORMAL LOW (ref 4.0–10.5)
nRBC: 0 % (ref 0.0–0.2)

## 2019-07-25 LAB — COMPREHENSIVE METABOLIC PANEL
ALT: 7 U/L (ref 0–44)
AST: 13 U/L — ABNORMAL LOW (ref 15–41)
Albumin: 1.5 g/dL — ABNORMAL LOW (ref 3.5–5.0)
Alkaline Phosphatase: 47 U/L (ref 38–126)
Anion gap: 7 (ref 5–15)
BUN: 6 mg/dL — ABNORMAL LOW (ref 8–23)
CO2: 29 mmol/L (ref 22–32)
Calcium: 7.2 mg/dL — ABNORMAL LOW (ref 8.9–10.3)
Chloride: 99 mmol/L (ref 98–111)
Creatinine, Ser: 2.35 mg/dL — ABNORMAL HIGH (ref 0.44–1.00)
GFR calc Af Amer: 21 mL/min — ABNORMAL LOW (ref >60)
GFR calc non Af Amer: 18 mL/min — ABNORMAL LOW (ref >60)
Glucose, Bld: 96 mg/dL (ref 70–99)
Potassium: 3.7 mmol/L (ref 3.5–5.1)
Sodium: 135 mmol/L (ref 135–145)
Total Bilirubin: 0.6 mg/dL (ref 0.3–1.2)
Total Protein: 8 g/dL (ref 6.5–8.1)

## 2019-07-25 MED ORDER — PROCHLORPERAZINE MALEATE 10 MG PO TABS
10.0000 mg | ORAL_TABLET | Freq: Once | ORAL | Status: AC
  Administered 2019-07-25: 10 mg via ORAL

## 2019-07-25 MED ORDER — ACETAMINOPHEN 325 MG PO TABS
650.0000 mg | ORAL_TABLET | Freq: Once | ORAL | Status: AC
  Administered 2019-07-25: 650 mg via ORAL

## 2019-07-25 MED ORDER — DEXAMETHASONE 4 MG PO TABS
20.0000 mg | ORAL_TABLET | Freq: Once | ORAL | Status: AC
  Administered 2019-07-25: 20 mg via ORAL

## 2019-07-25 MED ORDER — BORTEZOMIB CHEMO SQ INJECTION 3.5 MG (2.5MG/ML)
0.7800 mg/m2 | Freq: Once | INTRAMUSCULAR | Status: AC
  Administered 2019-07-25: 1.25 mg via SUBCUTANEOUS
  Filled 2019-07-25: qty 0.5

## 2019-07-25 MED ORDER — DARATUMUMAB-HYALURONIDASE-FIHJ 1800-30000 MG-UT/15ML ~~LOC~~ SOLN
1800.0000 mg | Freq: Once | SUBCUTANEOUS | Status: AC
  Administered 2019-07-25: 1800 mg via SUBCUTANEOUS
  Filled 2019-07-25: qty 15

## 2019-07-25 MED ORDER — DEXAMETHASONE 4 MG PO TABS
ORAL_TABLET | ORAL | Status: AC
  Filled 2019-07-25: qty 5

## 2019-07-25 MED ORDER — DEXAMETHASONE 4 MG PO TABS: ORAL_TABLET | ORAL | 0 refills | Status: DC

## 2019-07-25 MED ORDER — DIPHENHYDRAMINE HCL 25 MG PO CAPS
ORAL_CAPSULE | ORAL | Status: AC
  Filled 2019-07-25: qty 1

## 2019-07-25 MED ORDER — PROCHLORPERAZINE MALEATE 10 MG PO TABS
ORAL_TABLET | ORAL | Status: AC
  Filled 2019-07-25: qty 1

## 2019-07-25 MED ORDER — DIPHENHYDRAMINE HCL 25 MG PO CAPS
25.0000 mg | ORAL_CAPSULE | Freq: Once | ORAL | Status: AC
  Administered 2019-07-25: 25 mg via ORAL

## 2019-07-25 MED ORDER — ACETAMINOPHEN 325 MG PO TABS
ORAL_TABLET | ORAL | Status: AC
  Filled 2019-07-25: qty 2

## 2019-07-25 NOTE — Progress Notes (Signed)
Per Dr. Alvy Bimler ok to treat w/ today's labs

## 2019-07-25 NOTE — Assessment & Plan Note (Signed)
She has severe pancytopenia secondary to side effects of treatment We will proceed with dose adjustment of Velcade There is no dose adjustment needed for subcutaneous daratumumab Her nephrologist is managing her anemia She does not need transfusion support

## 2019-07-25 NOTE — Assessment & Plan Note (Signed)
I have reviewed recent blood work with the patient She has good response to treatment Overall, she is feeling better, without pain and with improved energy level Her bowel habits are also becoming regular She has some mild residual neuropathy but it does not bother her We will continue treatment as scheduled After her last treatment at the end of the month, she will be switched to maintenance treatment every 3 weeks I plan to reduce frequency of dexamethasone to 2 mg every other day I will see her again at the end of the month for further follow-up

## 2019-07-25 NOTE — Progress Notes (Signed)
[place] OFFICE PROGRESS NOTE  Patient Care Team: Lucianne Lei, MD as PCP - General (Family Medicine)  ASSESSMENT & PLAN:  Multiple myeloma not having achieved remission ([place]) I have reviewed recent blood work with the patient She has good response to treatment Overall, she is feeling better, without pain and with improved energy level Her bowel habits are also becoming regular She has some mild residual neuropathy but it does not bother her We will continue treatment as scheduled After her last treatment at the end of the month, she will be switched to maintenance treatment every 3 weeks I plan to reduce frequency of dexamethasone to 2 mg every other day I will see her again at the end of the month for further follow-up  Peripheral neuropathy due to chemotherapy [facility]) she has mild peripheral neuropathy, likely related to side effects of treatment. We will observe only for now After the end of the month, her treatment will be switched to every 3 weeks  Pancytopenia, acquired ([place]) She has severe pancytopenia secondary to side effects of treatment We will proceed with dose adjustment of Velcade There is no dose adjustment needed for subcutaneous daratumumab Her nephrologist is managing her anemia She does not need transfusion support   No orders of the defined types were placed in this encounter.   INTERVAL HISTORY: Please see below for problem oriented charting. She returns for further follow-up She feels well She is eating better and is gaining weight She is not bothered by neuropathy Her bowel habits are becoming regular She denies pain  SUMMARY OF ONCOLOGIC HISTORY: Oncology History  Multiple myeloma not having achieved remission ([place])  03/03/2019 Initial Diagnosis   Multiple myeloma not having achieved remission ([place])   03/03/2019 Imaging   1. Round and oval lucent/lytic lesions in the skull, bilateral humeri, distal left femur, and possibly also  in the pelvis are compatible with multiple myeloma. No right lower extremity lytic lesion identified. No pathologic fracture. 2. Advanced degenerative changes in the spine with no vertebral myeloma evident by plain radiography. 3. Advanced degenerative changes in the right upper extremity. Bilateral knee arthroplasty. 4.  Aortic Atherosclerosis (ICD10-I70.0).   03/04/2019 -  Chemotherapy   The patient had bortezomib for chemotherapy treatment.    03/04/2019 Bone Marrow Biopsy   Bone Marrow, Aspirate,Biopsy, and Clot - HYPERCELLULAR BONE MARROW FOR AGE WITH PLASMACYTOSIS. - SEE COMMENT. PERIPHERAL BLOOD: - NORMOCYTIC-NORMOCHROMIC ANEMIA. Diagnosis Note The bone marrow is hypercellular for age with increased number of atypical plasma cells averaging 30% of all cells in the aspirate associated with interstitial infiltrates and numerous variably sized aggregates in the clot and biopsy sections. 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 Confirmatory in situ hybridization for kappa and lambda as well as CD138 will be performed and the results reported in an addendum. The background shows trilineage hematopoiesis with mild non-specific changes primarily involving the erythroid series. Correlation with cytogenetic and FISH studies is recommended.    06/20/2019 -  Chemotherapy   The patient had daratumumab-hyaluronidase-fihj (DARZALEX FASPRO) 1800-30000 MG-UT/15ML chemo SQ injection 1,800 mg, 1,800 mg, Subcutaneous,  Once, 2 of 10 cycles Administration: 1,800 mg (06/20/2019), 1,800 mg (06/27/2019), 1,800 mg (07/04/2019), 1,800 mg (07/11/2019), 1,800 mg (07/18/2019)  for chemotherapy treatment.      REVIEW OF SYSTEMS:   Constitutional: Denies fevers, chills or abnormal weight loss Eyes: Denies blurriness of vision Ears, nose, mouth, throat, and face: Denies mucositis or sore throat Respiratory: Denies cough, dyspnea or wheezes Cardiovascular:  Denies palpitation, chest discomfort or lower  extremity swelling Gastrointestinal:  Denies nausea, heartburn or change in bowel habits Skin: Denies abnormal skin rashes Lymphatics: Denies new lymphadenopathy or easy bruising Behavioral/Psych: Mood is stable, no new changes  All other systems were reviewed with the patient and are negative.  I have reviewed the past medical history, past surgical history, social history and family history with the patient and they are unchanged from previous note.  ALLERGIES:  is allergic to aspirin; hydrocodone-acetaminophen; morphine; penicillins; brimonidine; diltiazem hcl; hydrocodone-acetaminophen; and neurontin [gabapentin].  MEDICATIONS:  Current Outpatient Medications  Medication Sig Dispense Refill  . acetaminophen (TYLENOL) 500 MG tablet Take 500 mg by mouth 2 (two) times daily as needed for moderate pain.    Marland Kitchen acyclovir (ZOVIRAX) 400 MG tablet Take 1 tablet (400 mg total) by mouth daily. 30 tablet 6  . allopurinol (ZYLOPRIM) 100 MG tablet Take 0.5 tablets (50 mg total) by mouth daily. 30 tablet 0  . B Complex-C-Zn-Folic Acid (DIALYVITE 254-YHCW 15) 0.8 MG TABS Take 1 tablet by mouth every evening.     Marland Kitchen dexamethasone (DECADRON) 4 MG tablet 2 mg Mondays, Wednesdays, Fridays and Sundays 30 tablet 0  . dorzolamide (TRUSOPT) 2 % ophthalmic solution Place 1 drop into the left eye every evening.   1  . ethyl chloride spray Apply 1 application topically as needed (prior to port being accessed).     . fluorometholone (FML) 0.1 % ophthalmic suspension Place 1 drop into the right eye 3 (three) times daily.     . isosorbide-hydrALAZINE (BIDIL) 20-37.5 MG tablet Take 1 tablet by mouth 2 (two) times a day. 60 tablet 0  . lactulose (CHRONULAC) 10 GM/15ML solution TAKE 15 MLS (10 G TOTAL) BY MOUTH 2 (TWO) TIMES DAILY. 473 mL 0  . LUMIGAN 0.01 % SOLN Place 1 drop into the left eye every evening.     Marland Kitchen LYRICA 50 MG capsule Take 50 mg by mouth at bedtime as needed (pain/neuropathy).     . ondansetron (ZOFRAN)  4 MG tablet Take 1 tablet (4 mg total) by mouth every 8 (eight) hours as needed for nausea. 30 tablet 1  . pantoprazole (PROTONIX) 40 MG tablet Take 1 tablet (40 mg total) by mouth daily as needed (acid reflux/indigestion.). 30 tablet 11  . polyethylene glycol (MIRALAX / GLYCOLAX) 17 g packet Take 17 g by mouth daily as needed (constipation).    . prochlorperazine (COMPAZINE) 5 MG tablet Take 1 tablet (5 mg total) by mouth every 6 (six) hours as needed for nausea or vomiting. 30 tablet 1  . senna-docusate (SENOKOT-S) 8.6-50 MG tablet Take 2 tablets by mouth 2 (two) times daily. (Patient taking differently: Take 2 tablets by mouth 2 (two) times daily as needed (constipation.). ) 30 tablet 0  . sevelamer carbonate (RENVELA) 800 MG tablet Take 800-1,200 mg by mouth daily.     . sodium chloride (OCEAN) 0.65 % SOLN nasal spray Place 1 spray into both nostrils 4 (four) times daily as needed for congestion.      No current facility-administered medications for this visit.     PHYSICAL EXAMINATION: ECOG PERFORMANCE STATUS: 2 - Symptomatic, <50% confined to bed  Vitals:   07/25/19 1044  BP: (!) 115/58  Pulse: (!) 102  Resp: 17  Temp: 99.1 F (37.3 C)  SpO2: 98%   Filed Weights   07/25/19 1044  Weight: 132 lb 12.8 oz (60.2 kg)    GENERAL:alert, no distress and comfortable SKIN: skin color, texture,  turgor are normal, no rashes or significant lesions EYES: normal, Conjunctiva are pink and non-injected, sclera clear OROPHARYNX:no exudate, no erythema and lips, buccal mucosa, and tongue normal  NECK: supple, thyroid normal size, non-tender, without nodularity LYMPH:  no palpable lymphadenopathy in the cervical, axillary or inguinal LUNGS: clear to auscultation and percussion with normal breathing effort HEART: regular rate & rhythm and no murmurs and no lower extremity edema ABDOMEN:abdomen soft, non-tender and normal bowel sounds Musculoskeletal:no cyanosis of digits and no clubbing  NEURO:  alert & oriented x 3 with fluent speech, no focal motor/sensory deficits  LABORATORY DATA:  I have reviewed the data as listed    Component Value Date/Time   NA 135 07/25/2019 1014   K 3.7 07/25/2019 1014   CL 99 07/25/2019 1014   CO2 29 07/25/2019 1014   GLUCOSE 96 07/25/2019 1014   BUN 6 (L) 07/25/2019 1014   CREATININE 2.35 (H) 07/25/2019 1014   CREATININE 1.03 (H) 05/11/2016 0931   CALCIUM 7.2 (L) 07/25/2019 1014   PROT 8.0 07/25/2019 1014   ALBUMIN 1.5 (L) 07/25/2019 1014   AST 13 (L) 07/25/2019 1014   ALT 7 07/25/2019 1014   ALKPHOS 47 07/25/2019 1014   BILITOT 0.6 07/25/2019 1014   GFRNONAA 18 (L) 07/25/2019 1014   GFRAA 21 (L) 07/25/2019 1014    No results found for: SPEP, UPEP  Lab Results  Component Value Date   WBC 2.2 (L) 07/25/2019   NEUTROABS 1.2 (L) 07/25/2019   HGB 9.1 (L) 07/25/2019   HCT 27.4 (L) 07/25/2019   MCV 94.8 07/25/2019   PLT 136 (L) 07/25/2019      Chemistry      Component Value Date/Time   NA 135 07/25/2019 1014   K 3.7 07/25/2019 1014   CL 99 07/25/2019 1014   CO2 29 07/25/2019 1014   BUN 6 (L) 07/25/2019 1014   CREATININE 2.35 (H) 07/25/2019 1014   CREATININE 1.03 (H) 05/11/2016 0931      Component Value Date/Time   CALCIUM 7.2 (L) 07/25/2019 1014   ALKPHOS 47 07/25/2019 1014   AST 13 (L) 07/25/2019 1014   ALT 7 07/25/2019 1014   BILITOT 0.6 07/25/2019 1014     All questions were answered. The patient knows to call the clinic with any problems, questions or concerns. No barriers to learning was detected.  I spent 15 minutes counseling the patient face to face. The total time spent in the appointment was 20 minutes and more than 50% was on counseling and review of test results  Heath Lark, MD 07/25/2019 11:10 AM

## 2019-07-25 NOTE — Assessment & Plan Note (Signed)
she has mild peripheral neuropathy, likely related to side effects of treatment. We will observe only for now After the end of the month, her treatment will be switched to every 3 weeks

## 2019-07-25 NOTE — Patient Instructions (Signed)
[facility] Discharge Instructions for Patients Receiving Chemotherapy  Today you received the following chemotherapy agents: Velcade, Darzalex  To help prevent nausea and vomiting after your treatment, we encourage you to take your nausea medication as directed.   If you develop nausea and vomiting that is not controlled by your nausea medication, call the clinic.   BELOW ARE SYMPTOMS THAT SHOULD BE REPORTED IMMEDIATELY:  *FEVER GREATER THAN 100.5 F  *CHILLS WITH OR WITHOUT FEVER  NAUSEA AND VOMITING THAT IS NOT CONTROLLED WITH YOUR NAUSEA MEDICATION  *UNUSUAL SHORTNESS OF BREATH  *UNUSUAL BRUISING OR BLEEDING  TENDERNESS IN MOUTH AND THROAT WITH OR WITHOUT PRESENCE OF ULCERS  *URINARY PROBLEMS  *BOWEL PROBLEMS  UNUSUAL RASH Items with * indicate a potential emergency and should be followed up as soon as possible.  Feel free to call the clinic should you have any questions or concerns. The clinic phone number is [phone].  Please show the CHEMO ALERT CARD at check-in to the Emergency Department and triage nurse.   

## 2019-07-28 ENCOUNTER — Telehealth: Payer: Self-pay | Admitting: Hematology and Oncology

## 2019-07-28 NOTE — Telephone Encounter (Signed)
I talk with patient regarding schedule  

## 2019-08-01 ENCOUNTER — Encounter: Payer: Self-pay | Admitting: Hematology and Oncology

## 2019-08-01 ENCOUNTER — Inpatient Hospital Stay: Payer: Medicare HMO

## 2019-08-01 ENCOUNTER — Other Ambulatory Visit: Payer: Self-pay

## 2019-08-01 DIAGNOSIS — G620 Drug-induced polyneuropathy: Secondary | ICD-10-CM | POA: Diagnosis not present

## 2019-08-01 DIAGNOSIS — D61818 Other pancytopenia: Secondary | ICD-10-CM | POA: Diagnosis not present

## 2019-08-01 DIAGNOSIS — Z5111 Encounter for antineoplastic chemotherapy: Secondary | ICD-10-CM | POA: Diagnosis not present

## 2019-08-01 DIAGNOSIS — Z79899 Other long term (current) drug therapy: Secondary | ICD-10-CM | POA: Diagnosis not present

## 2019-08-01 DIAGNOSIS — C9000 Multiple myeloma not having achieved remission: Secondary | ICD-10-CM | POA: Diagnosis not present

## 2019-08-01 DIAGNOSIS — T451X5A Adverse effect of antineoplastic and immunosuppressive drugs, initial encounter: Secondary | ICD-10-CM | POA: Diagnosis not present

## 2019-08-01 LAB — CBC WITH DIFFERENTIAL/PLATELET
Abs Immature Granulocytes: 0.01 10*3/uL (ref 0.00–0.07)
Basophils Absolute: 0 10*3/uL (ref 0.0–0.1)
Basophils Relative: 0 %
Eosinophils Absolute: 0 10*3/uL (ref 0.0–0.5)
Eosinophils Relative: 0 %
HCT: 29.6 % — ABNORMAL LOW (ref 36.0–46.0)
Hemoglobin: 9.7 g/dL — ABNORMAL LOW (ref 12.0–15.0)
Immature Granulocytes: 0 %
Lymphocytes Relative: 26 %
Lymphs Abs: 0.6 10*3/uL — ABNORMAL LOW (ref 0.7–4.0)
MCH: 31.8 pg (ref 26.0–34.0)
MCHC: 32.8 g/dL (ref 30.0–36.0)
MCV: 97 fL (ref 80.0–100.0)
Monocytes Absolute: 0.3 10*3/uL (ref 0.1–1.0)
Monocytes Relative: 14 %
Neutro Abs: 1.4 10*3/uL — ABNORMAL LOW (ref 1.7–7.7)
Neutrophils Relative %: 60 %
Platelets: 142 10*3/uL — ABNORMAL LOW (ref 150–400)
RBC: 3.05 MIL/uL — ABNORMAL LOW (ref 3.87–5.11)
RDW: 20.8 % — ABNORMAL HIGH (ref 11.5–15.5)
WBC: 2.4 10*3/uL — ABNORMAL LOW (ref 4.0–10.5)
nRBC: 0 % (ref 0.0–0.2)

## 2019-08-01 LAB — COMPREHENSIVE METABOLIC PANEL
ALT: 7 U/L (ref 0–44)
AST: 15 U/L (ref 15–41)
Albumin: 1.5 g/dL — ABNORMAL LOW (ref 3.5–5.0)
Alkaline Phosphatase: 55 U/L (ref 38–126)
Anion gap: 10 (ref 5–15)
BUN: 12 mg/dL (ref 8–23)
CO2: 26 mmol/L (ref 22–32)
Calcium: 7.3 mg/dL — ABNORMAL LOW (ref 8.9–10.3)
Chloride: 99 mmol/L (ref 98–111)
Creatinine, Ser: 3.02 mg/dL (ref 0.44–1.00)
GFR calc Af Amer: 16 mL/min — ABNORMAL LOW (ref >60)
GFR calc non Af Amer: 13 mL/min — ABNORMAL LOW (ref >60)
Glucose, Bld: 102 mg/dL — ABNORMAL HIGH (ref 70–99)
Potassium: 3.6 mmol/L (ref 3.5–5.1)
Sodium: 135 mmol/L (ref 135–145)
Total Bilirubin: 0.6 mg/dL (ref 0.3–1.2)
Total Protein: 7.8 g/dL (ref 6.5–8.1)

## 2019-08-06 DIAGNOSIS — C9000 Multiple myeloma not having achieved remission: Secondary | ICD-10-CM | POA: Diagnosis not present

## 2019-08-06 DIAGNOSIS — D630 Anemia in neoplastic disease: Secondary | ICD-10-CM | POA: Diagnosis not present

## 2019-08-06 DIAGNOSIS — I120 Hypertensive chronic kidney disease with stage 5 chronic kidney disease or end stage renal disease: Secondary | ICD-10-CM | POA: Diagnosis not present

## 2019-08-07 DIAGNOSIS — D631 Anemia in chronic kidney disease: Secondary | ICD-10-CM | POA: Diagnosis not present

## 2019-08-07 DIAGNOSIS — Z992 Dependence on renal dialysis: Secondary | ICD-10-CM | POA: Diagnosis not present

## 2019-08-07 DIAGNOSIS — E0822 Diabetes mellitus due to underlying condition with diabetic chronic kidney disease: Secondary | ICD-10-CM | POA: Diagnosis not present

## 2019-08-07 DIAGNOSIS — E876 Hypokalemia: Secondary | ICD-10-CM | POA: Diagnosis not present

## 2019-08-07 DIAGNOSIS — D689 Coagulation defect, unspecified: Secondary | ICD-10-CM | POA: Diagnosis not present

## 2019-08-07 DIAGNOSIS — N2581 Secondary hyperparathyroidism of renal origin: Secondary | ICD-10-CM | POA: Diagnosis not present

## 2019-08-07 DIAGNOSIS — D509 Iron deficiency anemia, unspecified: Secondary | ICD-10-CM | POA: Diagnosis not present

## 2019-08-07 DIAGNOSIS — N186 End stage renal disease: Secondary | ICD-10-CM | POA: Diagnosis not present

## 2019-08-08 ENCOUNTER — Ambulatory Visit (INDEPENDENT_AMBULATORY_CARE_PROVIDER_SITE_OTHER): Payer: Medicare HMO | Admitting: *Deleted

## 2019-08-08 ENCOUNTER — Inpatient Hospital Stay: Payer: Medicare HMO

## 2019-08-08 ENCOUNTER — Other Ambulatory Visit: Payer: Self-pay

## 2019-08-08 VITALS — BP 113/50 | HR 70 | Temp 98.2°F | Resp 18

## 2019-08-08 DIAGNOSIS — T451X5A Adverse effect of antineoplastic and immunosuppressive drugs, initial encounter: Secondary | ICD-10-CM | POA: Diagnosis not present

## 2019-08-08 DIAGNOSIS — Z79899 Other long term (current) drug therapy: Secondary | ICD-10-CM | POA: Diagnosis not present

## 2019-08-08 DIAGNOSIS — C9000 Multiple myeloma not having achieved remission: Secondary | ICD-10-CM

## 2019-08-08 DIAGNOSIS — I495 Sick sinus syndrome: Secondary | ICD-10-CM | POA: Diagnosis not present

## 2019-08-08 DIAGNOSIS — Z7189 Other specified counseling: Secondary | ICD-10-CM

## 2019-08-08 DIAGNOSIS — D61818 Other pancytopenia: Secondary | ICD-10-CM | POA: Diagnosis not present

## 2019-08-08 DIAGNOSIS — G620 Drug-induced polyneuropathy: Secondary | ICD-10-CM | POA: Diagnosis not present

## 2019-08-08 DIAGNOSIS — Z5111 Encounter for antineoplastic chemotherapy: Secondary | ICD-10-CM | POA: Diagnosis not present

## 2019-08-08 LAB — COMPREHENSIVE METABOLIC PANEL
ALT: <6 U/L (ref 0–44)
AST: 14 U/L — ABNORMAL LOW (ref 15–41)
Albumin: 1.6 g/dL — ABNORMAL LOW (ref 3.5–5.0)
Alkaline Phosphatase: 65 U/L (ref 38–126)
Anion gap: 8 (ref 5–15)
BUN: 10 mg/dL (ref 8–23)
CO2: 28 mmol/L (ref 22–32)
Calcium: 7.7 mg/dL — ABNORMAL LOW (ref 8.9–10.3)
Chloride: 99 mmol/L (ref 98–111)
Creatinine, Ser: 2.67 mg/dL — ABNORMAL HIGH (ref 0.44–1.00)
GFR calc Af Amer: 18 mL/min — ABNORMAL LOW (ref >60)
GFR calc non Af Amer: 16 mL/min — ABNORMAL LOW (ref >60)
Glucose, Bld: 98 mg/dL (ref 70–99)
Potassium: 3.6 mmol/L (ref 3.5–5.1)
Sodium: 135 mmol/L (ref 135–145)
Total Bilirubin: 0.5 mg/dL (ref 0.3–1.2)
Total Protein: 7.7 g/dL (ref 6.5–8.1)

## 2019-08-08 LAB — CBC WITH DIFFERENTIAL/PLATELET
Abs Immature Granulocytes: 0 10*3/uL (ref 0.00–0.07)
Basophils Absolute: 0 10*3/uL (ref 0.0–0.1)
Basophils Relative: 0 %
Eosinophils Absolute: 0 10*3/uL (ref 0.0–0.5)
Eosinophils Relative: 0 %
HCT: 31.6 % — ABNORMAL LOW (ref 36.0–46.0)
Hemoglobin: 10.2 g/dL — ABNORMAL LOW (ref 12.0–15.0)
Immature Granulocytes: 0 %
Lymphocytes Relative: 30 %
Lymphs Abs: 0.7 10*3/uL (ref 0.7–4.0)
MCH: 31.7 pg (ref 26.0–34.0)
MCHC: 32.3 g/dL (ref 30.0–36.0)
MCV: 98.1 fL (ref 80.0–100.0)
Monocytes Absolute: 0.3 10*3/uL (ref 0.1–1.0)
Monocytes Relative: 13 %
Neutro Abs: 1.4 10*3/uL — ABNORMAL LOW (ref 1.7–7.7)
Neutrophils Relative %: 57 %
Platelets: 164 10*3/uL (ref 150–400)
RBC: 3.22 MIL/uL — ABNORMAL LOW (ref 3.87–5.11)
RDW: 19.9 % — ABNORMAL HIGH (ref 11.5–15.5)
WBC: 2.4 10*3/uL — ABNORMAL LOW (ref 4.0–10.5)
nRBC: 0 % (ref 0.0–0.2)

## 2019-08-08 MED ORDER — DIPHENHYDRAMINE HCL 25 MG PO CAPS
ORAL_CAPSULE | ORAL | Status: AC
  Filled 2019-08-08: qty 1

## 2019-08-08 MED ORDER — ACETAMINOPHEN 325 MG PO TABS
650.0000 mg | ORAL_TABLET | Freq: Once | ORAL | Status: AC
  Administered 2019-08-08: 15:00:00 650 mg via ORAL

## 2019-08-08 MED ORDER — DARATUMUMAB-HYALURONIDASE-FIHJ 1800-30000 MG-UT/15ML ~~LOC~~ SOLN
1800.0000 mg | Freq: Once | SUBCUTANEOUS | Status: AC
  Administered 2019-08-08: 16:00:00 1800 mg via SUBCUTANEOUS
  Filled 2019-08-08: qty 15

## 2019-08-08 MED ORDER — PROCHLORPERAZINE MALEATE 10 MG PO TABS
ORAL_TABLET | ORAL | Status: AC
  Filled 2019-08-08: qty 1

## 2019-08-08 MED ORDER — PROCHLORPERAZINE MALEATE 10 MG PO TABS
10.0000 mg | ORAL_TABLET | Freq: Once | ORAL | Status: AC
  Administered 2019-08-08: 15:00:00 10 mg via ORAL

## 2019-08-08 MED ORDER — DEXAMETHASONE 4 MG PO TABS
ORAL_TABLET | ORAL | Status: AC
  Filled 2019-08-08: qty 5

## 2019-08-08 MED ORDER — ACETAMINOPHEN 325 MG PO TABS
ORAL_TABLET | ORAL | Status: AC
  Filled 2019-08-08: qty 2

## 2019-08-08 MED ORDER — BORTEZOMIB CHEMO SQ INJECTION 3.5 MG (2.5MG/ML)
0.7800 mg/m2 | Freq: Once | INTRAMUSCULAR | Status: AC
  Administered 2019-08-08: 16:00:00 1.25 mg via SUBCUTANEOUS
  Filled 2019-08-08: qty 0.5

## 2019-08-08 MED ORDER — DIPHENHYDRAMINE HCL 25 MG PO CAPS
25.0000 mg | ORAL_CAPSULE | Freq: Once | ORAL | Status: AC
  Administered 2019-08-08: 15:00:00 25 mg via ORAL

## 2019-08-08 MED ORDER — DEXAMETHASONE 4 MG PO TABS
20.0000 mg | ORAL_TABLET | Freq: Once | ORAL | Status: AC
  Administered 2019-08-08: 20 mg via ORAL

## 2019-08-08 NOTE — Patient Instructions (Signed)
[facility] Discharge Instructions for Patients Receiving Chemotherapy  Today you received the following chemotherapy agents Bortezomib (VELCADE) & Daratumumab (DARZALEX).  To help prevent nausea and vomiting after your treatment, we encourage you to take your nausea medication as prescribed.   If you develop nausea and vomiting that is not controlled by your nausea medication, call the clinic.   BELOW ARE SYMPTOMS THAT SHOULD BE REPORTED IMMEDIATELY:  *FEVER GREATER THAN 100.5 F  *CHILLS WITH OR WITHOUT FEVER  NAUSEA AND VOMITING THAT IS NOT CONTROLLED WITH YOUR NAUSEA MEDICATION  *UNUSUAL SHORTNESS OF BREATH  *UNUSUAL BRUISING OR BLEEDING  TENDERNESS IN MOUTH AND THROAT WITH OR WITHOUT PRESENCE OF ULCERS  *URINARY PROBLEMS  *BOWEL PROBLEMS  UNUSUAL RASH Items with * indicate a potential emergency and should be followed up as soon as possible.  Feel free to call the clinic should you have any questions or concerns. The clinic phone number is [phone].  Please show the [place] at check-in to the Emergency Department and triage nurse.  Coronavirus (COVID-19) Are you at risk?  Are you at risk for the Coronavirus (COVID-19)?  To be considered HIGH RISK for Coronavirus (COVID-19), you have to meet the following criteria:  . Traveled to [place], [place], [place], [place] or [place]; or in the [place] to [place], [place], [place], or [place]; and have fever, cough, and shortness of breath within the last 2 weeks of travel OR . Been in close contact with a person diagnosed with COVID-19 within the last 2 weeks and have fever, cough, and shortness of breath . IF YOU DO NOT MEET THESE CRITERIA, YOU ARE CONSIDERED LOW RISK FOR COVID-19.  What to do if you are HIGH RISK for COVID-19?  Marland Kitchen If you are having a medical emergency, call 911. . Seek medical care right away. Before you go to a doctor's office, urgent care or emergency department,  call ahead and tell them about your recent travel, contact with someone diagnosed with COVID-19, and your symptoms. You should receive instructions from your physician's office regarding next steps of care.  . When you arrive at healthcare provider, tell the healthcare staff immediately you have returned from visiting [place], [place], [place], [place] or [place]; or traveled in the [place] to [place], [place], [place], or [place]; in the last two weeks or you have been in close contact with a person diagnosed with COVID-19 in the last 2 weeks.   . Tell the health care staff about your symptoms: fever, cough and shortness of breath. . After you have been seen by a medical provider, you will be either: o Tested for (COVID-19) and discharged home on quarantine except to seek medical care if symptoms worsen, and asked to  - Stay home and avoid contact with others until you get your results (4-5 days)  - Avoid travel on public transportation if possible (such as bus, train, or airplane) or o Sent to the Emergency Department by EMS for evaluation, COVID-19 testing, and possible admission depending on your condition and test results.  What to do if you are LOW RISK for COVID-19?  Reduce your risk of any infection by using the same precautions used for avoiding the common cold or flu:  Marland Kitchen Wash your hands often with soap and warm water for at least 20 seconds.  If soap and water are not readily available, use an alcohol-based hand sanitizer with at least 60% alcohol.  Marland Kitchen  If coughing or sneezing, cover your mouth and nose by coughing or sneezing into the elbow areas of your shirt or coat, into a tissue or into your sleeve (not your hands). . Avoid shaking hands with others and consider head nods or verbal greetings only. . Avoid touching your eyes, nose, or mouth with unwashed hands.  . Avoid close contact with people who are sick. . Avoid places or events with large numbers of people in one  location, like concerts or sporting events. . Carefully consider travel plans you have or are making. . If you are planning any travel outside or inside the [place], visit the CDC's Travelers' Health webpage for the latest health notices. . If you have some symptoms but not all symptoms, continue to monitor at home and seek medical attention if your symptoms worsen. . If you are having a medical emergency, call 911.   [place] / e-Visit: [email or web address]         [facility]: [facility]: [phone]                   [facility]: [phone]

## 2019-08-10 LAB — CUP PACEART REMOTE DEVICE CHECK
Battery Remaining Longevity: 87 mo
Battery Remaining Percentage: 95.5 %
Battery Voltage: 2.98 V
Brady Statistic AP VP Percent: 83 %
Brady Statistic AP VS Percent: 2.1 %
Brady Statistic AS VP Percent: 5.7 %
Brady Statistic AS VS Percent: 9.3 %
Brady Statistic RA Percent Paced: 84 %
Brady Statistic RV Percent Paced: 88 %
Date Time Interrogation Session: 20201022062817
Implantable Lead Implant Date: 20100422
Implantable Lead Implant Date: 20100422
Implantable Lead Location: 753859
Implantable Lead Location: 753860
Implantable Pulse Generator Implant Date: 20170801
Lead Channel Impedance Value: 360 Ohm
Lead Channel Impedance Value: 360 Ohm
Lead Channel Pacing Threshold Amplitude: 0.75 V
Lead Channel Pacing Threshold Amplitude: 1 V
Lead Channel Pacing Threshold Pulse Width: 0.5 ms
Lead Channel Pacing Threshold Pulse Width: 0.5 ms
Lead Channel Sensing Intrinsic Amplitude: 2.7 mV
Lead Channel Sensing Intrinsic Amplitude: 5.8 mV
Lead Channel Setting Pacing Amplitude: 2 V
Lead Channel Setting Pacing Amplitude: 2.5 V
Lead Channel Setting Pacing Pulse Width: 0.5 ms
Lead Channel Setting Sensing Sensitivity: 2 mV
Pulse Gen Model: 2272
Pulse Gen Serial Number: 7929552

## 2019-08-11 ENCOUNTER — Other Ambulatory Visit: Payer: Self-pay | Admitting: Hematology and Oncology

## 2019-08-14 ENCOUNTER — Other Ambulatory Visit: Payer: Self-pay | Admitting: Hematology and Oncology

## 2019-08-15 ENCOUNTER — Encounter: Payer: Self-pay | Admitting: Hematology and Oncology

## 2019-08-15 ENCOUNTER — Inpatient Hospital Stay: Payer: Medicare HMO

## 2019-08-15 ENCOUNTER — Other Ambulatory Visit: Payer: Self-pay

## 2019-08-15 ENCOUNTER — Inpatient Hospital Stay (HOSPITAL_BASED_OUTPATIENT_CLINIC_OR_DEPARTMENT_OTHER): Payer: Medicare HMO | Admitting: Hematology and Oncology

## 2019-08-15 VITALS — BP 113/61 | HR 75 | Temp 97.8°F | Resp 18

## 2019-08-15 DIAGNOSIS — N186 End stage renal disease: Secondary | ICD-10-CM | POA: Diagnosis not present

## 2019-08-15 DIAGNOSIS — T451X5A Adverse effect of antineoplastic and immunosuppressive drugs, initial encounter: Secondary | ICD-10-CM | POA: Diagnosis not present

## 2019-08-15 DIAGNOSIS — Z79899 Other long term (current) drug therapy: Secondary | ICD-10-CM | POA: Diagnosis not present

## 2019-08-15 DIAGNOSIS — C9000 Multiple myeloma not having achieved remission: Secondary | ICD-10-CM | POA: Diagnosis not present

## 2019-08-15 DIAGNOSIS — Z7189 Other specified counseling: Secondary | ICD-10-CM

## 2019-08-15 DIAGNOSIS — Z992 Dependence on renal dialysis: Secondary | ICD-10-CM | POA: Diagnosis not present

## 2019-08-15 DIAGNOSIS — D61818 Other pancytopenia: Secondary | ICD-10-CM | POA: Diagnosis not present

## 2019-08-15 DIAGNOSIS — G620 Drug-induced polyneuropathy: Secondary | ICD-10-CM | POA: Diagnosis not present

## 2019-08-15 DIAGNOSIS — Z5111 Encounter for antineoplastic chemotherapy: Secondary | ICD-10-CM | POA: Diagnosis not present

## 2019-08-15 LAB — COMPREHENSIVE METABOLIC PANEL
ALT: 7 U/L (ref 0–44)
AST: 16 U/L (ref 15–41)
Albumin: 1.8 g/dL — ABNORMAL LOW (ref 3.5–5.0)
Alkaline Phosphatase: 68 U/L (ref 38–126)
Anion gap: 9 (ref 5–15)
BUN: 11 mg/dL (ref 8–23)
CO2: 29 mmol/L (ref 22–32)
Calcium: 7.8 mg/dL — ABNORMAL LOW (ref 8.9–10.3)
Chloride: 98 mmol/L (ref 98–111)
Creatinine, Ser: 2.72 mg/dL — ABNORMAL HIGH (ref 0.44–1.00)
GFR calc Af Amer: 18 mL/min — ABNORMAL LOW (ref >60)
GFR calc non Af Amer: 15 mL/min — ABNORMAL LOW (ref >60)
Glucose, Bld: 101 mg/dL — ABNORMAL HIGH (ref 70–99)
Potassium: 3.5 mmol/L (ref 3.5–5.1)
Sodium: 136 mmol/L (ref 135–145)
Total Bilirubin: 0.7 mg/dL (ref 0.3–1.2)
Total Protein: 8.1 g/dL (ref 6.5–8.1)

## 2019-08-15 LAB — CBC WITH DIFFERENTIAL/PLATELET
Abs Immature Granulocytes: 0.01 10*3/uL (ref 0.00–0.07)
Basophils Absolute: 0 10*3/uL (ref 0.0–0.1)
Basophils Relative: 0 %
Eosinophils Absolute: 0 10*3/uL (ref 0.0–0.5)
Eosinophils Relative: 0 %
HCT: 35.4 % — ABNORMAL LOW (ref 36.0–46.0)
Hemoglobin: 11.7 g/dL — ABNORMAL LOW (ref 12.0–15.0)
Immature Granulocytes: 0 %
Lymphocytes Relative: 39 %
Lymphs Abs: 0.9 10*3/uL (ref 0.7–4.0)
MCH: 31.8 pg (ref 26.0–34.0)
MCHC: 33.1 g/dL (ref 30.0–36.0)
MCV: 96.2 fL (ref 80.0–100.0)
Monocytes Absolute: 0.3 10*3/uL (ref 0.1–1.0)
Monocytes Relative: 14 %
Neutro Abs: 1.1 10*3/uL — ABNORMAL LOW (ref 1.7–7.7)
Neutrophils Relative %: 47 %
Platelets: 195 10*3/uL (ref 150–400)
RBC: 3.68 MIL/uL — ABNORMAL LOW (ref 3.87–5.11)
RDW: 18.2 % — ABNORMAL HIGH (ref 11.5–15.5)
WBC: 2.4 10*3/uL — ABNORMAL LOW (ref 4.0–10.5)
nRBC: 0 % (ref 0.0–0.2)

## 2019-08-15 MED ORDER — DEXAMETHASONE 4 MG PO TABS
20.0000 mg | ORAL_TABLET | Freq: Once | ORAL | Status: AC
  Administered 2019-08-15: 20 mg via ORAL

## 2019-08-15 MED ORDER — DEXAMETHASONE 4 MG PO TABS: ORAL_TABLET | ORAL | 0 refills | Status: DC

## 2019-08-15 MED ORDER — ACETAMINOPHEN 325 MG PO TABS
650.0000 mg | ORAL_TABLET | Freq: Once | ORAL | Status: AC
  Administered 2019-08-15: 650 mg via ORAL

## 2019-08-15 MED ORDER — ACETAMINOPHEN 325 MG PO TABS
ORAL_TABLET | ORAL | Status: AC
  Filled 2019-08-15: qty 2

## 2019-08-15 MED ORDER — DIPHENHYDRAMINE HCL 25 MG PO CAPS
25.0000 mg | ORAL_CAPSULE | Freq: Once | ORAL | Status: AC
  Administered 2019-08-15: 25 mg via ORAL

## 2019-08-15 MED ORDER — PROCHLORPERAZINE MALEATE 10 MG PO TABS
10.0000 mg | ORAL_TABLET | Freq: Once | ORAL | Status: AC
  Administered 2019-08-15: 10 mg via ORAL

## 2019-08-15 MED ORDER — DIPHENHYDRAMINE HCL 25 MG PO CAPS
ORAL_CAPSULE | ORAL | Status: AC
  Filled 2019-08-15: qty 1

## 2019-08-15 MED ORDER — PROCHLORPERAZINE MALEATE 10 MG PO TABS
ORAL_TABLET | ORAL | Status: AC
  Filled 2019-08-15: qty 1

## 2019-08-15 MED ORDER — BORTEZOMIB CHEMO SQ INJECTION 3.5 MG (2.5MG/ML)
0.7800 mg/m2 | Freq: Once | INTRAMUSCULAR | Status: AC
  Administered 2019-08-15: 1.25 mg via SUBCUTANEOUS
  Filled 2019-08-15: qty 0.5

## 2019-08-15 MED ORDER — DARATUMUMAB-HYALURONIDASE-FIHJ 1800-30000 MG-UT/15ML ~~LOC~~ SOLN
1800.0000 mg | Freq: Once | SUBCUTANEOUS | Status: AC
  Administered 2019-08-15: 1800 mg via SUBCUTANEOUS
  Filled 2019-08-15: qty 15

## 2019-08-15 MED ORDER — DEXAMETHASONE 4 MG PO TABS
ORAL_TABLET | ORAL | Status: AC
  Filled 2019-08-15: qty 5

## 2019-08-15 NOTE — Patient Instructions (Signed)
[facility] Discharge Instructions for Patients Receiving Chemotherapy  Today you received the following chemotherapy agents: Velcade, Darzalex faspro  To help prevent nausea and vomiting after your treatment, we encourage you to take your nausea medication as directed.   If you develop nausea and vomiting that is not controlled by your nausea medication, call the clinic.   BELOW ARE SYMPTOMS THAT SHOULD BE REPORTED IMMEDIATELY:  *FEVER GREATER THAN 100.5 F  *CHILLS WITH OR WITHOUT FEVER  NAUSEA AND VOMITING THAT IS NOT CONTROLLED WITH YOUR NAUSEA MEDICATION  *UNUSUAL SHORTNESS OF BREATH  *UNUSUAL BRUISING OR BLEEDING  TENDERNESS IN MOUTH AND THROAT WITH OR WITHOUT PRESENCE OF ULCERS  *URINARY PROBLEMS  *BOWEL PROBLEMS  UNUSUAL RASH Items with * indicate a potential emergency and should be followed up as soon as possible.  Feel free to call the clinic should you have any questions or concerns. The clinic phone number is [phone].  Please show the [place] at check-in to the Emergency Department and triage nurse.

## 2019-08-15 NOTE — Assessment & Plan Note (Signed)
She will continue hemodialysis on Tuesdays, Thursdays and Saturdays as scheduled Neither daratumumab nor Velcade needs dose adjustment in the setting of renal failure

## 2019-08-15 NOTE — Assessment & Plan Note (Signed)
She has severe pancytopenia secondary to side effects of treatment and bone marrow disease but since we started her on daratumumab, her pancytopenia has improved We will proceed with dose adjustment of Velcade There is no dose adjustment needed for subcutaneous daratumumab Her nephrologist is managing her anemia She does not need transfusion support

## 2019-08-15 NOTE — Assessment & Plan Note (Signed)
She has good response to treatment Overall, she is feeling better, without pain and with improved energy level Her bowel habits are also becoming regular Her pancytopenia is resolving She has some mild residual neuropathy but it does not bother her We will continue treatment as scheduled After today, she will be switched to maintenance treatment every 3 weeks I plan to reduce frequency of dexamethasone to 2 mg to twice a week and then stop

## 2019-08-15 NOTE — Progress Notes (Signed)
[name] OFFICE PROGRESS NOTE  Patient Care Team: Lucianne Lei, MD as PCP - General (Family Medicine)  ASSESSMENT & PLAN:  Multiple myeloma not having achieved remission [facility]) She has good response to treatment Overall, she is feeling better, without pain and with improved energy level Her bowel habits are also becoming regular Her pancytopenia is resolving She has some mild residual neuropathy but it does not bother her We will continue treatment as scheduled After today, she will be switched to maintenance treatment every 3 weeks I plan to reduce frequency of dexamethasone to 2 mg to twice a week and then stop  Pancytopenia, acquired ([place] She has severe pancytopenia secondary to side effects of treatment and bone marrow disease but since we started her on daratumumab, her pancytopenia has improved We will proceed with dose adjustment of Velcade There is no dose adjustment needed for subcutaneous daratumumab Her nephrologist is managing her anemia She does not need transfusion support  ESRD (end stage renal disease) on dialysis [facility]) She will continue hemodialysis on Tuesdays, Thursdays and Saturdays as scheduled Neither daratumumab nor Velcade needs dose adjustment in the setting of renal failure   No orders of the defined types were placed in this encounter.   INTERVAL HISTORY: Please see below for problem oriented charting. She returns for chemotherapy and follow-up She is doing very well on treatment She have more energy Dialysis is going well Her pain is minimum She denies worsening peripheral neuropathy She have no infusion reaction  SUMMARY OF ONCOLOGIC HISTORY: Oncology History  Multiple myeloma not having achieved remission ([place])  03/03/2019 Initial Diagnosis   Multiple myeloma not having achieved remission ([place])   03/03/2019 Imaging   1. Round and oval lucent/lytic lesions in the skull, bilateral humeri, distal left femur, and possibly also  in the pelvis are compatible with multiple myeloma. No right lower extremity lytic lesion identified. No pathologic fracture. 2. Advanced degenerative changes in the spine with no vertebral myeloma evident by plain radiography. 3. Advanced degenerative changes in the right upper extremity. Bilateral knee arthroplasty. 4.  Aortic Atherosclerosis (ICD10-I70.0).   03/04/2019 -  Chemotherapy   The patient had bortezomib for chemotherapy treatment.    03/04/2019 Bone Marrow Biopsy   Bone Marrow, Aspirate,Biopsy, and Clot - HYPERCELLULAR BONE MARROW FOR AGE WITH PLASMACYTOSIS. - SEE COMMENT. PERIPHERAL BLOOD: - NORMOCYTIC-NORMOCHROMIC ANEMIA. Diagnosis Note The bone marrow is hypercellular for age with increased number of atypical plasma cells averaging 30% of all cells in the aspirate associated with interstitial infiltrates and numerous variably sized aggregates in the clot and biopsy sections. 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 Confirmatory in situ hybridization for kappa and lambda as well as CD138 will be performed and the results reported in an addendum. The background shows trilineage hematopoiesis with mild non-specific changes primarily involving the erythroid series. Correlation with cytogenetic and FISH studies is recommended.    06/20/2019 -  Chemotherapy   The patient had daratumumab-hyaluronidase-fihj (DARZALEX FASPRO) 1800-30000 MG-UT/15ML chemo SQ injection 1,800 mg, 1,800 mg, Subcutaneous,  Once, 2 of 10 cycles Administration: 1,800 mg (06/20/2019), 1,800 mg (06/27/2019), 1,800 mg (07/04/2019), 1,800 mg (07/11/2019), 1,800 mg (07/18/2019), 1,800 mg (07/25/2019), 1,800 mg (08/08/2019)  for chemotherapy treatment.      REVIEW OF SYSTEMS:   Constitutional: Denies fevers, chills or abnormal weight loss Eyes: Denies blurriness of vision Ears, nose, mouth, throat, and face: Denies mucositis or sore throat Respiratory: Denies cough, dyspnea or wheezes Cardiovascular:  Denies palpitation, chest discomfort  or lower extremity swelling Gastrointestinal:  Denies nausea, heartburn or change in bowel habits Skin: Denies abnormal skin rashes Lymphatics: Denies new lymphadenopathy or easy bruising Neurological:Denies numbness, tingling or new weaknesses Behavioral/Psych: Mood is stable, no new changes  All other systems were reviewed with the patient and are negative.  I have reviewed the past medical history, past surgical history, social history and family history with the patient and they are unchanged from previous note.  ALLERGIES:  is allergic to aspirin; hydrocodone-acetaminophen; morphine; penicillins; brimonidine; diltiazem hcl; hydrocodone-acetaminophen; and neurontin [gabapentin].  MEDICATIONS:  Current Outpatient Medications  Medication Sig Dispense Refill  . acetaminophen (TYLENOL) 500 MG tablet Take 500 mg by mouth 2 (two) times daily as needed for moderate pain.    Marland Kitchen acyclovir (ZOVIRAX) 400 MG tablet TAKE 1 TABLET BY MOUTH EVERY DAY 90 tablet 2  . allopurinol (ZYLOPRIM) 100 MG tablet Take 0.5 tablets (50 mg total) by mouth daily. 30 tablet 0  . B Complex-C-Zn-Folic Acid (DIALYVITE 287-OMVE 15) 0.8 MG TABS Take 1 tablet by mouth every evening.     Marland Kitchen dexamethasone (DECADRON) 4 MG tablet 2 mg Mondays, and Thursdays 30 tablet 0  . dorzolamide (TRUSOPT) 2 % ophthalmic solution Place 1 drop into the left eye every evening.   1  . ethyl chloride spray Apply 1 application topically as needed (prior to port being accessed).     . fluorometholone (FML) 0.1 % ophthalmic suspension Place 1 drop into the right eye 3 (three) times daily.     . isosorbide-hydrALAZINE (BIDIL) 20-37.5 MG tablet Take 1 tablet by mouth 2 (two) times a day. 60 tablet 0  . lactulose (CHRONULAC) 10 GM/15ML solution TAKE 15 MLS (10 G TOTAL) BY MOUTH 2 (TWO) TIMES DAILY. 473 mL 0  . LUMIGAN 0.01 % SOLN Place 1 drop into the left eye every evening.     Marland Kitchen LYRICA 50 MG capsule Take 50 mg by  mouth at bedtime as needed (pain/neuropathy).     . ondansetron (ZOFRAN) 4 MG tablet Take 1 tablet (4 mg total) by mouth every 8 (eight) hours as needed for nausea. 30 tablet 1  . pantoprazole (PROTONIX) 40 MG tablet Take 1 tablet (40 mg total) by mouth daily as needed (acid reflux/indigestion.). 30 tablet 11  . polyethylene glycol (MIRALAX / GLYCOLAX) 17 g packet Take 17 g by mouth daily as needed (constipation).    . prochlorperazine (COMPAZINE) 5 MG tablet Take 1 tablet (5 mg total) by mouth every 6 (six) hours as needed for nausea or vomiting. 30 tablet 1  . senna-docusate (SENOKOT-S) 8.6-50 MG tablet Take 2 tablets by mouth 2 (two) times daily. (Patient taking differently: Take 2 tablets by mouth 2 (two) times daily as needed (constipation.). ) 30 tablet 0  . sevelamer carbonate (RENVELA) 800 MG tablet Take 800-1,200 mg by mouth daily.     . sodium chloride (OCEAN) 0.65 % SOLN nasal spray Place 1 spray into both nostrils 4 (four) times daily as needed for congestion.      No current facility-administered medications for this visit.     PHYSICAL EXAMINATION: ECOG PERFORMANCE STATUS: 2 - Symptomatic, <50% confined to bed  Vitals:   08/15/19 1140  BP: (!) 103/40  Pulse: 62  Resp: 18  Temp: 98.5 F (36.9 C)  SpO2: 99%   Filed Weights   08/15/19 1140  Weight: 131 lb 4.8 oz (59.6 kg)    GENERAL:alert, no distress and comfortable SKIN: skin color, texture, turgor are normal, no  rashes or significant lesions EYES: normal, Conjunctiva are pink and non-injected, sclera clear OROPHARYNX:no exudate, no erythema and lips, buccal mucosa, and tongue normal  NECK: supple, thyroid normal size, non-tender, without nodularity LYMPH:  no palpable lymphadenopathy in the cervical, axillary or inguinal LUNGS: clear to auscultation and percussion with normal breathing effort HEART: regular rate & rhythm and no murmurs and no lower extremity edema ABDOMEN:abdomen soft, non-tender and normal bowel  sounds Musculoskeletal:no cyanosis of digits and no clubbing  NEURO: alert & oriented x 3 with fluent speech, no focal motor/sensory deficits  LABORATORY DATA:  I have reviewed the data as listed    Component Value Date/Time   NA 136 08/15/2019 1102   K 3.5 08/15/2019 1102   CL 98 08/15/2019 1102   CO2 29 08/15/2019 1102   GLUCOSE 101 (H) 08/15/2019 1102   BUN 11 08/15/2019 1102   CREATININE 2.72 (H) 08/15/2019 1102   CREATININE 1.03 (H) 05/11/2016 0931   CALCIUM 7.8 (L) 08/15/2019 1102   PROT 8.1 08/15/2019 1102   ALBUMIN 1.8 (L) 08/15/2019 1102   AST 16 08/15/2019 1102   ALT 7 08/15/2019 1102   ALKPHOS 68 08/15/2019 1102   BILITOT 0.7 08/15/2019 1102   GFRNONAA 15 (L) 08/15/2019 1102   GFRAA 18 (L) 08/15/2019 1102    No results found for: SPEP, UPEP  Lab Results  Component Value Date   WBC 2.4 (L) 08/15/2019   NEUTROABS 1.1 (L) 08/15/2019   HGB 11.7 (L) 08/15/2019   HCT 35.4 (L) 08/15/2019   MCV 96.2 08/15/2019   PLT 195 08/15/2019      Chemistry      Component Value Date/Time   NA 136 08/15/2019 1102   K 3.5 08/15/2019 1102   CL 98 08/15/2019 1102   CO2 29 08/15/2019 1102   BUN 11 08/15/2019 1102   CREATININE 2.72 (H) 08/15/2019 1102   CREATININE 1.03 (H) 05/11/2016 0931      Component Value Date/Time   CALCIUM 7.8 (L) 08/15/2019 1102   ALKPHOS 68 08/15/2019 1102   AST 16 08/15/2019 1102   ALT 7 08/15/2019 1102   BILITOT 0.7 08/15/2019 1102       All questions were answered. The patient knows to call the clinic with any problems, questions or concerns. No barriers to learning was detected.  I spent 15 minutes counseling the patient face to face. The total time spent in the appointment was 20 minutes and more than 50% was on counseling and review of test results  Heath Lark, MD 08/15/2019 12:03 PM

## 2019-08-16 DIAGNOSIS — Z992 Dependence on renal dialysis: Secondary | ICD-10-CM | POA: Diagnosis not present

## 2019-08-16 DIAGNOSIS — E1122 Type 2 diabetes mellitus with diabetic chronic kidney disease: Secondary | ICD-10-CM | POA: Diagnosis not present

## 2019-08-16 DIAGNOSIS — N186 End stage renal disease: Secondary | ICD-10-CM | POA: Diagnosis not present

## 2019-08-18 LAB — MULTIPLE MYELOMA PANEL, SERUM
Albumin SerPl Elph-Mcnc: 2.6 g/dL — ABNORMAL LOW (ref 2.9–4.4)
Albumin/Glob SerPl: 0.6 — ABNORMAL LOW (ref 0.7–1.7)
Alpha 1: 0.2 g/dL (ref 0.0–0.4)
Alpha2 Glob SerPl Elph-Mcnc: 0.7 g/dL (ref 0.4–1.0)
B-Globulin SerPl Elph-Mcnc: 3.9 g/dL — ABNORMAL HIGH (ref 0.7–1.3)
Gamma Glob SerPl Elph-Mcnc: 0.1 g/dL — ABNORMAL LOW (ref 0.4–1.8)
Globulin, Total: 4.9 g/dL — ABNORMAL HIGH (ref 2.2–3.9)
IgA: 15 mg/dL — ABNORMAL LOW (ref 64–422)
IgG (Immunoglobin G), Serum: 4053 mg/dL — ABNORMAL HIGH (ref 586–1602)
IgM (Immunoglobulin M), Srm: 9 mg/dL — ABNORMAL LOW (ref 26–217)
M Protein SerPl Elph-Mcnc: 3.5 g/dL — ABNORMAL HIGH
Total Protein ELP: 7.5 g/dL (ref 6.0–8.5)

## 2019-08-18 LAB — KAPPA/LAMBDA LIGHT CHAINS
Kappa free light chain: 1219.9 mg/L — ABNORMAL HIGH (ref 3.3–19.4)
Kappa, lambda light chain ratio: 111.92 — ABNORMAL HIGH (ref 0.26–1.65)
Lambda free light chains: 10.9 mg/L (ref 5.7–26.3)

## 2019-08-18 NOTE — Progress Notes (Signed)
Remote pacemaker transmission.   

## 2019-08-19 DIAGNOSIS — E876 Hypokalemia: Secondary | ICD-10-CM | POA: Diagnosis not present

## 2019-08-19 DIAGNOSIS — Z992 Dependence on renal dialysis: Secondary | ICD-10-CM | POA: Diagnosis not present

## 2019-08-19 DIAGNOSIS — E0822 Diabetes mellitus due to underlying condition with diabetic chronic kidney disease: Secondary | ICD-10-CM | POA: Diagnosis not present

## 2019-08-19 DIAGNOSIS — D689 Coagulation defect, unspecified: Secondary | ICD-10-CM | POA: Diagnosis not present

## 2019-08-19 DIAGNOSIS — N2581 Secondary hyperparathyroidism of renal origin: Secondary | ICD-10-CM | POA: Diagnosis not present

## 2019-08-19 DIAGNOSIS — D509 Iron deficiency anemia, unspecified: Secondary | ICD-10-CM | POA: Diagnosis not present

## 2019-08-19 DIAGNOSIS — N186 End stage renal disease: Secondary | ICD-10-CM | POA: Diagnosis not present

## 2019-08-19 DIAGNOSIS — D631 Anemia in chronic kidney disease: Secondary | ICD-10-CM | POA: Diagnosis not present

## 2019-08-20 DIAGNOSIS — R6881 Early satiety: Secondary | ICD-10-CM | POA: Diagnosis not present

## 2019-08-20 DIAGNOSIS — R634 Abnormal weight loss: Secondary | ICD-10-CM | POA: Diagnosis not present

## 2019-08-31 DIAGNOSIS — R69 Illness, unspecified: Secondary | ICD-10-CM | POA: Diagnosis not present

## 2019-09-05 ENCOUNTER — Inpatient Hospital Stay (HOSPITAL_BASED_OUTPATIENT_CLINIC_OR_DEPARTMENT_OTHER): Payer: Medicare HMO | Admitting: Hematology and Oncology

## 2019-09-05 ENCOUNTER — Encounter: Payer: Self-pay | Admitting: Hematology and Oncology

## 2019-09-05 ENCOUNTER — Inpatient Hospital Stay: Payer: Medicare HMO

## 2019-09-05 ENCOUNTER — Inpatient Hospital Stay: Payer: Medicare HMO | Attending: Hematology and Oncology

## 2019-09-05 ENCOUNTER — Other Ambulatory Visit: Payer: Self-pay

## 2019-09-05 DIAGNOSIS — Z79899 Other long term (current) drug therapy: Secondary | ICD-10-CM | POA: Diagnosis not present

## 2019-09-05 DIAGNOSIS — D61818 Other pancytopenia: Secondary | ICD-10-CM | POA: Diagnosis not present

## 2019-09-05 DIAGNOSIS — T451X5A Adverse effect of antineoplastic and immunosuppressive drugs, initial encounter: Secondary | ICD-10-CM | POA: Insufficient documentation

## 2019-09-05 DIAGNOSIS — Z992 Dependence on renal dialysis: Secondary | ICD-10-CM | POA: Insufficient documentation

## 2019-09-05 DIAGNOSIS — N186 End stage renal disease: Secondary | ICD-10-CM

## 2019-09-05 DIAGNOSIS — C9000 Multiple myeloma not having achieved remission: Secondary | ICD-10-CM

## 2019-09-05 DIAGNOSIS — Z5111 Encounter for antineoplastic chemotherapy: Secondary | ICD-10-CM | POA: Diagnosis not present

## 2019-09-05 DIAGNOSIS — Z7189 Other specified counseling: Secondary | ICD-10-CM

## 2019-09-05 DIAGNOSIS — G620 Drug-induced polyneuropathy: Secondary | ICD-10-CM

## 2019-09-05 LAB — CBC WITH DIFFERENTIAL/PLATELET
Abs Immature Granulocytes: 0 10*3/uL (ref 0.00–0.07)
Basophils Absolute: 0 10*3/uL (ref 0.0–0.1)
Basophils Relative: 0 %
Eosinophils Absolute: 0 10*3/uL (ref 0.0–0.5)
Eosinophils Relative: 1 %
HCT: 35.3 % — ABNORMAL LOW (ref 36.0–46.0)
Hemoglobin: 11.5 g/dL — ABNORMAL LOW (ref 12.0–15.0)
Immature Granulocytes: 0 %
Lymphocytes Relative: 40 %
Lymphs Abs: 1 10*3/uL (ref 0.7–4.0)
MCH: 31.3 pg (ref 26.0–34.0)
MCHC: 32.6 g/dL (ref 30.0–36.0)
MCV: 95.9 fL (ref 80.0–100.0)
Monocytes Absolute: 0.2 10*3/uL (ref 0.1–1.0)
Monocytes Relative: 10 %
Neutro Abs: 1.2 10*3/uL — ABNORMAL LOW (ref 1.7–7.7)
Neutrophils Relative %: 49 %
Platelets: 168 10*3/uL (ref 150–400)
RBC: 3.68 MIL/uL — ABNORMAL LOW (ref 3.87–5.11)
RDW: 15.1 % (ref 11.5–15.5)
WBC: 2.4 10*3/uL — ABNORMAL LOW (ref 4.0–10.5)
nRBC: 0 % (ref 0.0–0.2)

## 2019-09-05 LAB — COMPREHENSIVE METABOLIC PANEL
ALT: 8 U/L (ref 0–44)
AST: 19 U/L (ref 15–41)
Albumin: 2.2 g/dL — ABNORMAL LOW (ref 3.5–5.0)
Alkaline Phosphatase: 51 U/L (ref 38–126)
Anion gap: 10 (ref 5–15)
BUN: 14 mg/dL (ref 8–23)
CO2: 27 mmol/L (ref 22–32)
Calcium: 7.9 mg/dL — ABNORMAL LOW (ref 8.9–10.3)
Chloride: 101 mmol/L (ref 98–111)
Creatinine, Ser: 2.92 mg/dL — ABNORMAL HIGH (ref 0.44–1.00)
GFR calc Af Amer: 16 mL/min — ABNORMAL LOW (ref >60)
GFR calc non Af Amer: 14 mL/min — ABNORMAL LOW (ref >60)
Glucose, Bld: 106 mg/dL — ABNORMAL HIGH (ref 70–99)
Potassium: 3.8 mmol/L (ref 3.5–5.1)
Sodium: 138 mmol/L (ref 135–145)
Total Bilirubin: 0.5 mg/dL (ref 0.3–1.2)
Total Protein: 7.1 g/dL (ref 6.5–8.1)

## 2019-09-05 MED ORDER — DIPHENHYDRAMINE HCL 25 MG PO CAPS
25.0000 mg | ORAL_CAPSULE | Freq: Once | ORAL | Status: AC
  Administered 2019-09-05: 25 mg via ORAL

## 2019-09-05 MED ORDER — DIPHENHYDRAMINE HCL 25 MG PO CAPS
ORAL_CAPSULE | ORAL | Status: AC
  Filled 2019-09-05: qty 1

## 2019-09-05 MED ORDER — ACETAMINOPHEN 325 MG PO TABS
ORAL_TABLET | ORAL | Status: AC
  Filled 2019-09-05: qty 2

## 2019-09-05 MED ORDER — DARATUMUMAB-HYALURONIDASE-FIHJ 1800-30000 MG-UT/15ML ~~LOC~~ SOLN
1800.0000 mg | Freq: Once | SUBCUTANEOUS | Status: AC
  Administered 2019-09-05: 1800 mg via SUBCUTANEOUS
  Filled 2019-09-05: qty 15

## 2019-09-05 MED ORDER — PROCHLORPERAZINE MALEATE 10 MG PO TABS
ORAL_TABLET | ORAL | Status: AC
  Filled 2019-09-05: qty 1

## 2019-09-05 MED ORDER — DEXAMETHASONE 4 MG PO TABS
20.0000 mg | ORAL_TABLET | Freq: Once | ORAL | Status: AC
  Administered 2019-09-05: 20 mg via ORAL

## 2019-09-05 MED ORDER — ACETAMINOPHEN 325 MG PO TABS
650.0000 mg | ORAL_TABLET | Freq: Once | ORAL | Status: AC
  Administered 2019-09-05: 650 mg via ORAL

## 2019-09-05 MED ORDER — DEXAMETHASONE 4 MG PO TABS
ORAL_TABLET | ORAL | Status: AC
  Filled 2019-09-05: qty 5

## 2019-09-05 MED ORDER — BORTEZOMIB CHEMO SQ INJECTION 3.5 MG (2.5MG/ML)
0.7800 mg/m2 | Freq: Once | INTRAMUSCULAR | Status: AC
  Administered 2019-09-05: 1.25 mg via SUBCUTANEOUS
  Filled 2019-09-05: qty 0.5

## 2019-09-05 MED ORDER — PROCHLORPERAZINE MALEATE 10 MG PO TABS
10.0000 mg | ORAL_TABLET | Freq: Once | ORAL | Status: AC
  Administered 2019-09-05: 10 mg via ORAL

## 2019-09-05 NOTE — Progress Notes (Signed)
Per Dr. Alvy Bimler, okay to continue with treatment using today's labs.

## 2019-09-05 NOTE — Assessment & Plan Note (Signed)
She has severe pancytopenia secondary to side effects of treatment and bone marrow disease but since we started her on daratumumab, her pancytopenia has improved There is no dose adjustment needed for subcutaneous daratumumab/Velcade Her nephrologist is managing her anemia She does not need transfusion support

## 2019-09-05 NOTE — Progress Notes (Signed)
[place] OFFICE PROGRESS NOTE  Patient Care Team: Lucianne Lei, MD as PCP - General (Family Medicine)  ASSESSMENT & PLAN:  Multiple myeloma not having achieved remission [facility]) She has good response to treatment Overall, she is feeling better, without pain and with improved energy level Her pancytopenia is resolving She has some mild residual neuropathy but it does not bother her We will continue treatment as scheduled She will continue maintenance treatment every 3 weeks We have tapered her off dexamethasone  Pancytopenia, acquired ([place]) She has severe pancytopenia secondary to side effects of treatment and bone marrow disease but since we started her on daratumumab, her pancytopenia has improved There is no dose adjustment needed for subcutaneous daratumumab/Velcade Her nephrologist is managing her anemia She does not need transfusion support  Peripheral neuropathy due to chemotherapy [facility]) she has mild peripheral neuropathy, likely related to side effects of treatment. We will observe only for now Her current treatment is every 3 weeks  ESRD (end stage renal disease) on dialysis [facility]) She will continue hemodialysis on Tuesdays, Thursdays and Saturdays as scheduled Neither daratumumab nor Velcade needs dose adjustment in the setting of renal failure   No orders of the defined types were placed in this encounter.   INTERVAL HISTORY: Please see below for problem oriented charting. She returns for chemo and follow-up She tolerated treatment well No worsening neuropathy No recent infection No recent infusion reactions  SUMMARY OF ONCOLOGIC HISTORY: Oncology History  Multiple myeloma not having achieved remission ([place])  03/03/2019 Initial Diagnosis   Multiple myeloma not having achieved remission ([place])   03/03/2019 Imaging   1. Round and oval lucent/lytic lesions in the skull, bilateral humeri, distal left femur, and possibly also in the pelvis are  compatible with multiple myeloma. No right lower extremity lytic lesion identified. No pathologic fracture. 2. Advanced degenerative changes in the spine with no vertebral myeloma evident by plain radiography. 3. Advanced degenerative changes in the right upper extremity. Bilateral knee arthroplasty. 4.  Aortic Atherosclerosis (ICD10-I70.0).   03/04/2019 -  Chemotherapy   The patient had bortezomib for chemotherapy treatment.    03/04/2019 Bone Marrow Biopsy   Bone Marrow, Aspirate,Biopsy, and Clot - HYPERCELLULAR BONE MARROW FOR AGE WITH PLASMACYTOSIS. - SEE COMMENT. PERIPHERAL BLOOD: - NORMOCYTIC-NORMOCHROMIC ANEMIA. Diagnosis Note The bone marrow is hypercellular for age with increased number of atypical plasma cells averaging 30% of all cells in the aspirate associated with interstitial infiltrates and numerous variably sized aggregates in the clot and biopsy sections. 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 Confirmatory in situ hybridization for kappa and lambda as well as CD138 will be performed and the results reported in an addendum. The background shows trilineage hematopoiesis with mild non-specific changes primarily involving the erythroid series. Correlation with cytogenetic and FISH studies is recommended.    06/20/2019 -  Chemotherapy   The patient had daratumumab-hyaluronidase-fihj (DARZALEX FASPRO) 1800-30000 MG-UT/15ML chemo SQ injection 1,800 mg, 1,800 mg, Subcutaneous,  Once, 3 of 10 cycles Administration: 1,800 mg (06/20/2019), 1,800 mg (06/27/2019), 1,800 mg (07/04/2019), 1,800 mg (07/11/2019), 1,800 mg (07/18/2019), 1,800 mg (07/25/2019), 1,800 mg (08/08/2019), 1,800 mg (08/15/2019)  for chemotherapy treatment.      REVIEW OF SYSTEMS:   Constitutional: Denies fevers, chills or abnormal weight loss Eyes: Denies blurriness of vision Ears, nose, mouth, throat, and face: Denies mucositis or sore throat Respiratory: Denies cough, dyspnea or  wheezes Cardiovascular: Denies palpitation, chest discomfort or lower extremity swelling Gastrointestinal:  Denies nausea, heartburn or  change in bowel habits Skin: Denies abnormal skin rashes Lymphatics: Denies new lymphadenopathy or easy bruising Neurological:Denies numbness, tingling or new weaknesses Behavioral/Psych: Mood is stable, no new changes  All other systems were reviewed with the patient and are negative.  I have reviewed the past medical history, past surgical history, social history and family history with the patient and they are unchanged from previous note.  ALLERGIES:  is allergic to aspirin; hydrocodone-acetaminophen; morphine; penicillins; brimonidine; diltiazem hcl; hydrocodone-acetaminophen; and neurontin [gabapentin].  MEDICATIONS:  Current Outpatient Medications  Medication Sig Dispense Refill  . acetaminophen (TYLENOL) 500 MG tablet Take 500 mg by mouth 2 (two) times daily as needed for moderate pain.    Marland Kitchen acyclovir (ZOVIRAX) 400 MG tablet TAKE 1 TABLET BY MOUTH EVERY DAY 90 tablet 2  . allopurinol (ZYLOPRIM) 100 MG tablet Take 0.5 tablets (50 mg total) by mouth daily. 30 tablet 0  . B Complex-C-Zn-Folic Acid (DIALYVITE 354-TGYB 15) 0.8 MG TABS Take 1 tablet by mouth every evening.     Marland Kitchen dexamethasone (DECADRON) 4 MG tablet 2 mg Mondays, and Thursdays 30 tablet 0  . dorzolamide (TRUSOPT) 2 % ophthalmic solution Place 1 drop into the left eye every evening.   1  . ethyl chloride spray Apply 1 application topically as needed (prior to port being accessed).     . fluorometholone (FML) 0.1 % ophthalmic suspension Place 1 drop into the right eye 3 (three) times daily.     . isosorbide-hydrALAZINE (BIDIL) 20-37.5 MG tablet Take 1 tablet by mouth 2 (two) times a day. 60 tablet 0  . lactulose (CHRONULAC) 10 GM/15ML solution TAKE 15 MLS (10 G TOTAL) BY MOUTH 2 (TWO) TIMES DAILY. 473 mL 0  . LUMIGAN 0.01 % SOLN Place 1 drop into the left eye every evening.     Marland Kitchen LYRICA 50  MG capsule Take 50 mg by mouth at bedtime as needed (pain/neuropathy).     . ondansetron (ZOFRAN) 4 MG tablet Take 1 tablet (4 mg total) by mouth every 8 (eight) hours as needed for nausea. 30 tablet 1  . pantoprazole (PROTONIX) 40 MG tablet Take 1 tablet (40 mg total) by mouth daily as needed (acid reflux/indigestion.). 30 tablet 11  . polyethylene glycol (MIRALAX / GLYCOLAX) 17 g packet Take 17 g by mouth daily as needed (constipation).    . prochlorperazine (COMPAZINE) 5 MG tablet Take 1 tablet (5 mg total) by mouth every 6 (six) hours as needed for nausea or vomiting. 30 tablet 1  . senna-docusate (SENOKOT-S) 8.6-50 MG tablet Take 2 tablets by mouth 2 (two) times daily. (Patient taking differently: Take 2 tablets by mouth 2 (two) times daily as needed (constipation.). ) 30 tablet 0  . sevelamer carbonate (RENVELA) 800 MG tablet Take 800-1,200 mg by mouth daily.     . sodium chloride (OCEAN) 0.65 % SOLN nasal spray Place 1 spray into both nostrils 4 (four) times daily as needed for congestion.      No current facility-administered medications for this visit.    Facility-Administered Medications Ordered in Other Visits  Medication Dose Route Frequency Provider Last Rate Last Dose  . bortezomib SQ (VELCADE) chemo injection 1.25 mg  0.78 mg/m2 (Treatment Plan Recorded) Subcutaneous Once Loukisha Gunnerson, MD      . daratumumab-hyaluronidase-fihj (DARZALEX FASPRO) 1800-30000 MG-UT/15ML chemo SQ injection 1,800 mg  1,800 mg Subcutaneous Once Alvy Bimler, Pearly Apachito, MD        PHYSICAL EXAMINATION: ECOG PERFORMANCE STATUS: 2 - Symptomatic, <50% confined to bed  Vitals:   09/05/19 1111  BP: (!) 110/59  Pulse: 69  Resp: 18  Temp: 98.2 F (36.8 C)  SpO2: 99%   Filed Weights   09/05/19 1111  Weight: 130 lb 14.4 oz (59.4 kg)    GENERAL:alert, no distress and comfortable SKIN: skin color, texture, turgor are normal, no rashes or significant lesions EYES: normal, Conjunctiva are pink and non-injected, sclera  clear OROPHARYNX:no exudate, no erythema and lips, buccal mucosa, and tongue normal  NECK: supple, thyroid normal size, non-tender, without nodularity LYMPH:  no palpable lymphadenopathy in the cervical, axillary or inguinal LUNGS: clear to auscultation and percussion with normal breathing effort HEART: regular rate & rhythm and no murmurs and no lower extremity edema ABDOMEN:abdomen soft, non-tender and normal bowel sounds Musculoskeletal:no cyanosis of digits and no clubbing  NEURO: alert & oriented x 3 with fluent speech, no focal motor/sensory deficits  LABORATORY DATA:  I have reviewed the data as listed    Component Value Date/Time   NA 138 09/05/2019 1044   K 3.8 09/05/2019 1044   CL 101 09/05/2019 1044   CO2 27 09/05/2019 1044   GLUCOSE 106 (H) 09/05/2019 1044   BUN 14 09/05/2019 1044   CREATININE 2.92 (H) 09/05/2019 1044   CREATININE 1.03 (H) 05/11/2016 0931   CALCIUM 7.9 (L) 09/05/2019 1044   PROT 7.1 09/05/2019 1044   ALBUMIN 2.2 (L) 09/05/2019 1044   AST 19 09/05/2019 1044   ALT 8 09/05/2019 1044   ALKPHOS 51 09/05/2019 1044   BILITOT 0.5 09/05/2019 1044   GFRNONAA 14 (L) 09/05/2019 1044   GFRAA 16 (L) 09/05/2019 1044    No results found for: SPEP, UPEP  Lab Results  Component Value Date   WBC 2.4 (L) 09/05/2019   NEUTROABS 1.2 (L) 09/05/2019   HGB 11.5 (L) 09/05/2019   HCT 35.3 (L) 09/05/2019   MCV 95.9 09/05/2019   PLT 168 09/05/2019      Chemistry      Component Value Date/Time   NA 138 09/05/2019 1044   K 3.8 09/05/2019 1044   CL 101 09/05/2019 1044   CO2 27 09/05/2019 1044   BUN 14 09/05/2019 1044   CREATININE 2.92 (H) 09/05/2019 1044   CREATININE 1.03 (H) 05/11/2016 0931      Component Value Date/Time   CALCIUM 7.9 (L) 09/05/2019 1044   ALKPHOS 51 09/05/2019 1044   AST 19 09/05/2019 1044   ALT 8 09/05/2019 1044   BILITOT 0.5 09/05/2019 1044      All questions were answered. The patient knows to call the clinic with any problems,  questions or concerns. No barriers to learning was detected.  I spent 15 minutes counseling the patient face to face. The total time spent in the appointment was 20 minutes and more than 50% was on counseling and review of test results  Heath Lark, MD 09/05/2019 12:25 PM

## 2019-09-05 NOTE — Assessment & Plan Note (Signed)
she has mild peripheral neuropathy, likely related to side effects of treatment. We will observe only for now Her current treatment is every 3 weeks

## 2019-09-05 NOTE — Assessment & Plan Note (Signed)
She will continue hemodialysis on Tuesdays, Thursdays and Saturdays as scheduled Neither daratumumab nor Velcade needs dose adjustment in the setting of renal failure

## 2019-09-05 NOTE — Assessment & Plan Note (Signed)
She has good response to treatment Overall, she is feeling better, without pain and with improved energy level Her pancytopenia is resolving She has some mild residual neuropathy but it does not bother her We will continue treatment as scheduled She will continue maintenance treatment every 3 weeks We have tapered her off dexamethasone

## 2019-09-05 NOTE — Patient Instructions (Signed)
[facility] Discharge Instructions for Patients Receiving Chemotherapy  Today you received the following chemotherapy agents: Velcade and Faspro.  To help prevent nausea and vomiting after your treatment, we encourage you to take your nausea medication as directed.   If you develop nausea and vomiting that is not controlled by your nausea medication, call the clinic.   BELOW ARE SYMPTOMS THAT SHOULD BE REPORTED IMMEDIATELY:  *FEVER GREATER THAN 100.5 F  *CHILLS WITH OR WITHOUT FEVER  NAUSEA AND VOMITING THAT IS NOT CONTROLLED WITH YOUR NAUSEA MEDICATION  *UNUSUAL SHORTNESS OF BREATH  *UNUSUAL BRUISING OR BLEEDING  TENDERNESS IN MOUTH AND THROAT WITH OR WITHOUT PRESENCE OF ULCERS  *URINARY PROBLEMS  *BOWEL PROBLEMS  UNUSUAL RASH Items with * indicate a potential emergency and should be followed up as soon as possible.  Feel free to call the clinic should you have any questions or concerns. The clinic phone number is [phone].  Please show the [facility] at check-in to the Emergency Department and triage nurse.

## 2019-09-08 ENCOUNTER — Telehealth: Payer: Self-pay | Admitting: Hematology and Oncology

## 2019-09-08 DIAGNOSIS — Z992 Dependence on renal dialysis: Secondary | ICD-10-CM | POA: Diagnosis not present

## 2019-09-08 DIAGNOSIS — E876 Hypokalemia: Secondary | ICD-10-CM | POA: Diagnosis not present

## 2019-09-08 DIAGNOSIS — D631 Anemia in chronic kidney disease: Secondary | ICD-10-CM | POA: Diagnosis not present

## 2019-09-08 DIAGNOSIS — E0822 Diabetes mellitus due to underlying condition with diabetic chronic kidney disease: Secondary | ICD-10-CM | POA: Diagnosis not present

## 2019-09-08 DIAGNOSIS — D509 Iron deficiency anemia, unspecified: Secondary | ICD-10-CM | POA: Diagnosis not present

## 2019-09-08 DIAGNOSIS — D689 Coagulation defect, unspecified: Secondary | ICD-10-CM | POA: Diagnosis not present

## 2019-09-08 DIAGNOSIS — N2581 Secondary hyperparathyroidism of renal origin: Secondary | ICD-10-CM | POA: Diagnosis not present

## 2019-09-08 DIAGNOSIS — N186 End stage renal disease: Secondary | ICD-10-CM | POA: Diagnosis not present

## 2019-09-08 LAB — KAPPA/LAMBDA LIGHT CHAINS
Kappa free light chain: 677.4 mg/L — ABNORMAL HIGH (ref 3.3–19.4)
Kappa, lambda light chain ratio: 63.91 — ABNORMAL HIGH (ref 0.26–1.65)
Lambda free light chains: 10.6 mg/L (ref 5.7–26.3)

## 2019-09-08 NOTE — Telephone Encounter (Signed)
Scheduled appt per 11/20 sch message - called pt . Someone answer home phone and said to call back in a few hours. - will call pt back another time

## 2019-09-09 LAB — MULTIPLE MYELOMA PANEL, SERUM
Albumin SerPl Elph-Mcnc: 2.7 g/dL — ABNORMAL LOW (ref 2.9–4.4)
Albumin/Glob SerPl: 0.7 (ref 0.7–1.7)
Alpha 1: 0.2 g/dL (ref 0.0–0.4)
Alpha2 Glob SerPl Elph-Mcnc: 0.6 g/dL (ref 0.4–1.0)
B-Globulin SerPl Elph-Mcnc: 3.3 g/dL — ABNORMAL HIGH (ref 0.7–1.3)
Gamma Glob SerPl Elph-Mcnc: 0.2 g/dL — ABNORMAL LOW (ref 0.4–1.8)
Globulin, Total: 4.3 g/dL — ABNORMAL HIGH (ref 2.2–3.9)
IgA: 14 mg/dL — ABNORMAL LOW (ref 64–422)
IgG (Immunoglobin G), Serum: 3066 mg/dL — ABNORMAL HIGH (ref 586–1602)
IgM (Immunoglobulin M), Srm: 8 mg/dL — ABNORMAL LOW (ref 26–217)
M Protein SerPl Elph-Mcnc: 2.7 g/dL — ABNORMAL HIGH
Total Protein ELP: 7 g/dL (ref 6.0–8.5)

## 2019-09-10 ENCOUNTER — Telehealth: Payer: Self-pay | Admitting: Hematology and Oncology

## 2019-09-10 NOTE — Telephone Encounter (Signed)
Scheduled appt per 11/20 sch message - called pt son and he is aware of appt date and time

## 2019-09-15 DIAGNOSIS — Z992 Dependence on renal dialysis: Secondary | ICD-10-CM | POA: Diagnosis not present

## 2019-09-15 DIAGNOSIS — N186 End stage renal disease: Secondary | ICD-10-CM | POA: Diagnosis not present

## 2019-09-15 DIAGNOSIS — E1122 Type 2 diabetes mellitus with diabetic chronic kidney disease: Secondary | ICD-10-CM | POA: Diagnosis not present

## 2019-09-16 DIAGNOSIS — E876 Hypokalemia: Secondary | ICD-10-CM | POA: Diagnosis not present

## 2019-09-16 DIAGNOSIS — D689 Coagulation defect, unspecified: Secondary | ICD-10-CM | POA: Diagnosis not present

## 2019-09-16 DIAGNOSIS — Z992 Dependence on renal dialysis: Secondary | ICD-10-CM | POA: Diagnosis not present

## 2019-09-16 DIAGNOSIS — N2581 Secondary hyperparathyroidism of renal origin: Secondary | ICD-10-CM | POA: Diagnosis not present

## 2019-09-16 DIAGNOSIS — D631 Anemia in chronic kidney disease: Secondary | ICD-10-CM | POA: Diagnosis not present

## 2019-09-16 DIAGNOSIS — E0822 Diabetes mellitus due to underlying condition with diabetic chronic kidney disease: Secondary | ICD-10-CM | POA: Diagnosis not present

## 2019-09-16 DIAGNOSIS — D509 Iron deficiency anemia, unspecified: Secondary | ICD-10-CM | POA: Diagnosis not present

## 2019-09-16 DIAGNOSIS — N186 End stage renal disease: Secondary | ICD-10-CM | POA: Diagnosis not present

## 2019-09-17 DIAGNOSIS — Z992 Dependence on renal dialysis: Secondary | ICD-10-CM | POA: Diagnosis not present

## 2019-09-17 DIAGNOSIS — N186 End stage renal disease: Secondary | ICD-10-CM | POA: Diagnosis not present

## 2019-09-17 DIAGNOSIS — I771 Stricture of artery: Secondary | ICD-10-CM | POA: Diagnosis not present

## 2019-09-17 DIAGNOSIS — I871 Compression of vein: Secondary | ICD-10-CM | POA: Diagnosis not present

## 2019-09-26 ENCOUNTER — Inpatient Hospital Stay: Payer: Medicare HMO

## 2019-09-26 ENCOUNTER — Other Ambulatory Visit: Payer: Self-pay | Admitting: Hematology and Oncology

## 2019-09-26 ENCOUNTER — Other Ambulatory Visit: Payer: Self-pay

## 2019-09-26 ENCOUNTER — Inpatient Hospital Stay: Payer: Medicare HMO | Attending: Hematology and Oncology

## 2019-09-26 VITALS — BP 132/69 | HR 82 | Temp 98.3°F | Resp 18

## 2019-09-26 DIAGNOSIS — Z5111 Encounter for antineoplastic chemotherapy: Secondary | ICD-10-CM | POA: Diagnosis present

## 2019-09-26 DIAGNOSIS — Z7189 Other specified counseling: Secondary | ICD-10-CM

## 2019-09-26 DIAGNOSIS — Z5112 Encounter for antineoplastic immunotherapy: Secondary | ICD-10-CM | POA: Insufficient documentation

## 2019-09-26 DIAGNOSIS — D539 Nutritional anemia, unspecified: Secondary | ICD-10-CM

## 2019-09-26 DIAGNOSIS — C9000 Multiple myeloma not having achieved remission: Secondary | ICD-10-CM | POA: Insufficient documentation

## 2019-09-26 LAB — CBC WITH DIFFERENTIAL/PLATELET
Abs Immature Granulocytes: 0.01 10*3/uL (ref 0.00–0.07)
Basophils Absolute: 0 10*3/uL (ref 0.0–0.1)
Basophils Relative: 0 %
Eosinophils Absolute: 0 10*3/uL (ref 0.0–0.5)
Eosinophils Relative: 1 %
HCT: 32.1 % — ABNORMAL LOW (ref 36.0–46.0)
Hemoglobin: 10.6 g/dL — ABNORMAL LOW (ref 12.0–15.0)
Immature Granulocytes: 0 %
Lymphocytes Relative: 35 %
Lymphs Abs: 0.9 10*3/uL (ref 0.7–4.0)
MCH: 30.7 pg (ref 26.0–34.0)
MCHC: 33 g/dL (ref 30.0–36.0)
MCV: 93 fL (ref 80.0–100.0)
Monocytes Absolute: 0.3 10*3/uL (ref 0.1–1.0)
Monocytes Relative: 11 %
Neutro Abs: 1.4 10*3/uL — ABNORMAL LOW (ref 1.7–7.7)
Neutrophils Relative %: 53 %
Platelets: 142 10*3/uL — ABNORMAL LOW (ref 150–400)
RBC: 3.45 MIL/uL — ABNORMAL LOW (ref 3.87–5.11)
RDW: 14.7 % (ref 11.5–15.5)
WBC: 2.6 10*3/uL — ABNORMAL LOW (ref 4.0–10.5)
nRBC: 0 % (ref 0.0–0.2)

## 2019-09-26 LAB — VITAMIN B12: Vitamin B-12: 180 pg/mL (ref 180–914)

## 2019-09-26 LAB — COMPREHENSIVE METABOLIC PANEL
ALT: 6 U/L (ref 0–44)
AST: 20 U/L (ref 15–41)
Albumin: 2.4 g/dL — ABNORMAL LOW (ref 3.5–5.0)
Alkaline Phosphatase: 48 U/L (ref 38–126)
Anion gap: 7 (ref 5–15)
BUN: 9 mg/dL (ref 8–23)
CO2: 31 mmol/L (ref 22–32)
Calcium: 7.8 mg/dL — ABNORMAL LOW (ref 8.9–10.3)
Chloride: 99 mmol/L (ref 98–111)
Creatinine, Ser: 2.26 mg/dL — ABNORMAL HIGH (ref 0.44–1.00)
GFR calc Af Amer: 22 mL/min — ABNORMAL LOW (ref >60)
GFR calc non Af Amer: 19 mL/min — ABNORMAL LOW (ref >60)
Glucose, Bld: 87 mg/dL (ref 70–99)
Potassium: 3.6 mmol/L (ref 3.5–5.1)
Sodium: 137 mmol/L (ref 135–145)
Total Bilirubin: 0.5 mg/dL (ref 0.3–1.2)
Total Protein: 6.7 g/dL (ref 6.5–8.1)

## 2019-09-26 MED ORDER — BORTEZOMIB CHEMO SQ INJECTION 3.5 MG (2.5MG/ML)
0.7800 mg/m2 | Freq: Once | INTRAMUSCULAR | Status: AC
  Administered 2019-09-26: 1.25 mg via SUBCUTANEOUS
  Filled 2019-09-26: qty 0.5

## 2019-09-26 MED ORDER — DIPHENHYDRAMINE HCL 25 MG PO CAPS
ORAL_CAPSULE | ORAL | Status: AC
  Filled 2019-09-26: qty 1

## 2019-09-26 MED ORDER — DEXAMETHASONE 4 MG PO TABS
ORAL_TABLET | ORAL | Status: AC
  Filled 2019-09-26: qty 5

## 2019-09-26 MED ORDER — DIPHENHYDRAMINE HCL 25 MG PO CAPS
25.0000 mg | ORAL_CAPSULE | Freq: Once | ORAL | Status: AC
  Administered 2019-09-26: 25 mg via ORAL

## 2019-09-26 MED ORDER — ACETAMINOPHEN 325 MG PO TABS
ORAL_TABLET | ORAL | Status: AC
  Filled 2019-09-26: qty 2

## 2019-09-26 MED ORDER — PROCHLORPERAZINE MALEATE 10 MG PO TABS
10.0000 mg | ORAL_TABLET | Freq: Once | ORAL | Status: AC
  Administered 2019-09-26: 10 mg via ORAL

## 2019-09-26 MED ORDER — PROCHLORPERAZINE MALEATE 10 MG PO TABS
ORAL_TABLET | ORAL | Status: AC
  Filled 2019-09-26: qty 1

## 2019-09-26 MED ORDER — ACETAMINOPHEN 325 MG PO TABS
650.0000 mg | ORAL_TABLET | Freq: Once | ORAL | Status: AC
  Administered 2019-09-26: 650 mg via ORAL

## 2019-09-26 MED ORDER — DARATUMUMAB-HYALURONIDASE-FIHJ 1800-30000 MG-UT/15ML ~~LOC~~ SOLN
1800.0000 mg | Freq: Once | SUBCUTANEOUS | Status: AC
  Administered 2019-09-26: 1800 mg via SUBCUTANEOUS
  Filled 2019-09-26: qty 15

## 2019-09-26 MED ORDER — DEXAMETHASONE 4 MG PO TABS
20.0000 mg | ORAL_TABLET | Freq: Once | ORAL | Status: AC
  Administered 2019-09-26: 20 mg via ORAL

## 2019-09-26 NOTE — Progress Notes (Signed)
Okay to treat with low ANC per Dr Alvy Bimler

## 2019-09-26 NOTE — Patient Instructions (Signed)
Daratumumab injection What is this medicine? DARATUMUMAB (dar a toom ue mab) is a monoclonal antibody. It is used to treat multiple myeloma. This medicine may be used for other purposes; ask your health care provider or pharmacist if you have questions. COMMON BRAND NAME(S): DARZALEX What should I tell my health care provider before I take this medicine? They need to know if you have any of these conditions:  infection (especially a virus infection such as chickenpox, herpes, or hepatitis B virus)  lung or breathing disease  an unusual or allergic reaction to daratumumab, other medicines, foods, dyes, or preservatives  pregnant or trying to get pregnant  breast-feeding How should I use this medicine? This medicine is for infusion into a vein. It is given by a health care professional in a hospital or clinic setting. Talk to your pediatrician regarding the use of this medicine in children. Special care may be needed. Overdosage: If you think you have taken too much of this medicine contact a poison control center or emergency room at once. NOTE: This medicine is only for you. Do not share this medicine with others. What if I miss a dose? Keep appointments for follow-up doses as directed. It is important not to miss your dose. Call your doctor or health care professional if you are unable to keep an appointment. What may interact with this medicine? Interactions have not been studied. This list may not describe all possible interactions. Give your health care provider a list of all the medicines, herbs, non-prescription drugs, or dietary supplements you use. Also tell them if you smoke, drink alcohol, or use illegal drugs. Some items may interact with your medicine. What should I watch for while using this medicine? This drug may make you feel generally unwell. Report any side effects. Continue your course of treatment even though you feel ill unless your doctor tells you to stop. This  medicine can cause serious allergic reactions. To reduce your risk you may need to take medicine before treatment with this medicine. Take your medicine as directed. This medicine can affect the results of blood tests to match your blood type. These changes can last for up to 6 months after the final dose. Your healthcare provider will do blood tests to match your blood type before you start treatment. Tell all of your healthcare providers that you are being treated with this medicine before receiving a blood transfusion. This medicine can affect the results of some tests used to determine treatment response; extra tests may be needed to evaluate response. Do not become pregnant while taking this medicine or for 3 months after stopping it. Women should inform their doctor if they wish to become pregnant or think they might be pregnant. There is a potential for serious side effects to an unborn child. Talk to your health care professional or pharmacist for more information. What side effects may I notice from receiving this medicine? Side effects that you should report to your doctor or health care professional as soon as possible:  allergic reactions like skin rash, itching or hives, swelling of the face, lips, or tongue  breathing problems  chills  cough  dizziness  feeling faint or lightheaded  headache  low blood counts - this medicine may decrease the number of white blood cells, red blood cells and platelets. You may be at increased risk for infections and bleeding.  nausea, vomiting  shortness of breath  signs of decreased platelets or bleeding - bruising, pinpoint red spots on  the skin, black, tarry stools, blood in the urine  signs of decreased red blood cells - unusually weak or tired, feeling faint or lightheaded, falls  signs of infection - fever or chills, cough, sore throat, pain or difficulty passing urine  signs and symptoms of liver injury like dark yellow or brown  urine; general ill feeling or flu-like symptoms; light-colored stools; loss of appetite; right upper belly pain; unusually weak or tired; yellowing of the eyes or skin Side effects that usually do not require medical attention (report to your doctor or health care professional if they continue or are bothersome):  back pain  constipation  loss of appetite  diarrhea  joint pain  muscle cramps  pain, tingling, numbness in the hands or feet  swelling of the ankles, feet, hands  tiredness  trouble sleeping This list may not describe all possible side effects. Call your doctor for medical advice about side effects. You may report side effects to FDA at 1-800-FDA-1088. Where should I keep my medicine? Keep out of the reach of children. This drug is given in a hospital or clinic and will not be stored at home. NOTE: This sheet is a summary. It may not cover all possible information. If you have questions about this medicine, talk to your doctor, pharmacist, or health care provider.  2020 [employer]vier/Gold Standard (2018-07-18 14:00:48)

## 2019-10-06 DIAGNOSIS — Z452 Encounter for adjustment and management of vascular access device: Secondary | ICD-10-CM | POA: Diagnosis not present

## 2019-10-07 DIAGNOSIS — N2581 Secondary hyperparathyroidism of renal origin: Secondary | ICD-10-CM | POA: Diagnosis not present

## 2019-10-07 DIAGNOSIS — Z992 Dependence on renal dialysis: Secondary | ICD-10-CM | POA: Diagnosis not present

## 2019-10-07 DIAGNOSIS — D689 Coagulation defect, unspecified: Secondary | ICD-10-CM | POA: Diagnosis not present

## 2019-10-07 DIAGNOSIS — D631 Anemia in chronic kidney disease: Secondary | ICD-10-CM | POA: Diagnosis not present

## 2019-10-07 DIAGNOSIS — E876 Hypokalemia: Secondary | ICD-10-CM | POA: Diagnosis not present

## 2019-10-07 DIAGNOSIS — D509 Iron deficiency anemia, unspecified: Secondary | ICD-10-CM | POA: Diagnosis not present

## 2019-10-07 DIAGNOSIS — E0822 Diabetes mellitus due to underlying condition with diabetic chronic kidney disease: Secondary | ICD-10-CM | POA: Diagnosis not present

## 2019-10-07 DIAGNOSIS — N186 End stage renal disease: Secondary | ICD-10-CM | POA: Diagnosis not present

## 2019-10-16 DIAGNOSIS — N186 End stage renal disease: Secondary | ICD-10-CM | POA: Diagnosis not present

## 2019-10-16 DIAGNOSIS — Z992 Dependence on renal dialysis: Secondary | ICD-10-CM | POA: Diagnosis not present

## 2019-10-16 DIAGNOSIS — E1122 Type 2 diabetes mellitus with diabetic chronic kidney disease: Secondary | ICD-10-CM | POA: Diagnosis not present

## 2019-10-20 ENCOUNTER — Inpatient Hospital Stay (HOSPITAL_BASED_OUTPATIENT_CLINIC_OR_DEPARTMENT_OTHER): Payer: Medicare HMO | Admitting: Hematology and Oncology

## 2019-10-20 ENCOUNTER — Inpatient Hospital Stay: Payer: Medicare HMO

## 2019-10-20 ENCOUNTER — Telehealth: Payer: Self-pay | Admitting: Hematology and Oncology

## 2019-10-20 ENCOUNTER — Other Ambulatory Visit: Payer: Self-pay

## 2019-10-20 ENCOUNTER — Other Ambulatory Visit: Payer: Self-pay | Admitting: Hematology and Oncology

## 2019-10-20 ENCOUNTER — Inpatient Hospital Stay: Payer: Medicare HMO | Attending: Hematology and Oncology

## 2019-10-20 ENCOUNTER — Encounter: Payer: Self-pay | Admitting: Hematology and Oncology

## 2019-10-20 DIAGNOSIS — D61818 Other pancytopenia: Secondary | ICD-10-CM | POA: Insufficient documentation

## 2019-10-20 DIAGNOSIS — T451X5A Adverse effect of antineoplastic and immunosuppressive drugs, initial encounter: Secondary | ICD-10-CM

## 2019-10-20 DIAGNOSIS — N186 End stage renal disease: Secondary | ICD-10-CM

## 2019-10-20 DIAGNOSIS — M25559 Pain in unspecified hip: Secondary | ICD-10-CM | POA: Diagnosis not present

## 2019-10-20 DIAGNOSIS — E538 Deficiency of other specified B group vitamins: Secondary | ICD-10-CM

## 2019-10-20 DIAGNOSIS — G620 Drug-induced polyneuropathy: Secondary | ICD-10-CM

## 2019-10-20 DIAGNOSIS — K5909 Other constipation: Secondary | ICD-10-CM | POA: Insufficient documentation

## 2019-10-20 DIAGNOSIS — C9000 Multiple myeloma not having achieved remission: Secondary | ICD-10-CM | POA: Insufficient documentation

## 2019-10-20 DIAGNOSIS — Z992 Dependence on renal dialysis: Secondary | ICD-10-CM

## 2019-10-20 DIAGNOSIS — Z5111 Encounter for antineoplastic chemotherapy: Secondary | ICD-10-CM | POA: Diagnosis not present

## 2019-10-20 DIAGNOSIS — R110 Nausea: Secondary | ICD-10-CM

## 2019-10-20 DIAGNOSIS — Z79899 Other long term (current) drug therapy: Secondary | ICD-10-CM | POA: Diagnosis not present

## 2019-10-20 DIAGNOSIS — Z7189 Other specified counseling: Secondary | ICD-10-CM

## 2019-10-20 LAB — CBC WITH DIFFERENTIAL/PLATELET
Abs Immature Granulocytes: 0 10*3/uL (ref 0.00–0.07)
Basophils Absolute: 0 10*3/uL (ref 0.0–0.1)
Basophils Relative: 0 %
Eosinophils Absolute: 0 10*3/uL (ref 0.0–0.5)
Eosinophils Relative: 1 %
HCT: 29.7 % — ABNORMAL LOW (ref 36.0–46.0)
Hemoglobin: 9.7 g/dL — ABNORMAL LOW (ref 12.0–15.0)
Immature Granulocytes: 0 %
Lymphocytes Relative: 34 %
Lymphs Abs: 1.1 10*3/uL (ref 0.7–4.0)
MCH: 29.8 pg (ref 26.0–34.0)
MCHC: 32.7 g/dL (ref 30.0–36.0)
MCV: 91.4 fL (ref 80.0–100.0)
Monocytes Absolute: 0.3 10*3/uL (ref 0.1–1.0)
Monocytes Relative: 10 %
Neutro Abs: 1.8 10*3/uL (ref 1.7–7.7)
Neutrophils Relative %: 55 %
Platelets: 154 10*3/uL (ref 150–400)
RBC: 3.25 MIL/uL — ABNORMAL LOW (ref 3.87–5.11)
RDW: 15.4 % (ref 11.5–15.5)
WBC: 3.3 10*3/uL — ABNORMAL LOW (ref 4.0–10.5)
nRBC: 0 % (ref 0.0–0.2)

## 2019-10-20 LAB — CMP (CANCER CENTER ONLY)
ALT: 10 U/L (ref 0–44)
AST: 18 U/L (ref 15–41)
Albumin: 2.8 g/dL — ABNORMAL LOW (ref 3.5–5.0)
Alkaline Phosphatase: 41 U/L (ref 38–126)
Anion gap: 11 (ref 5–15)
BUN: 14 mg/dL (ref 8–23)
CO2: 29 mmol/L (ref 22–32)
Calcium: 8.6 mg/dL — ABNORMAL LOW (ref 8.9–10.3)
Chloride: 98 mmol/L (ref 98–111)
Creatinine: 2.71 mg/dL — ABNORMAL HIGH (ref 0.44–1.00)
GFR, Est AFR Am: 18 mL/min — ABNORMAL LOW (ref >60)
GFR, Estimated: 15 mL/min — ABNORMAL LOW (ref >60)
Glucose, Bld: 86 mg/dL (ref 70–99)
Potassium: 3.8 mmol/L (ref 3.5–5.1)
Sodium: 138 mmol/L (ref 135–145)
Total Bilirubin: 0.4 mg/dL (ref 0.3–1.2)
Total Protein: 6.6 g/dL (ref 6.5–8.1)

## 2019-10-20 MED ORDER — ACETAMINOPHEN 325 MG PO TABS
ORAL_TABLET | ORAL | Status: AC
  Filled 2019-10-20: qty 2

## 2019-10-20 MED ORDER — PROCHLORPERAZINE MALEATE 10 MG PO TABS
10.0000 mg | ORAL_TABLET | Freq: Once | ORAL | Status: AC
  Administered 2019-10-20: 10 mg via ORAL

## 2019-10-20 MED ORDER — CYANOCOBALAMIN 1000 MCG/ML IJ SOLN
1000.0000 ug | Freq: Once | INTRAMUSCULAR | Status: AC
  Administered 2019-10-20: 14:00:00 1000 ug via INTRAMUSCULAR
  Filled 2019-10-20: qty 1

## 2019-10-20 MED ORDER — DIPHENHYDRAMINE HCL 25 MG PO CAPS
ORAL_CAPSULE | ORAL | Status: AC
  Filled 2019-10-20: qty 2

## 2019-10-20 MED ORDER — DEXAMETHASONE 4 MG PO TABS
ORAL_TABLET | ORAL | Status: AC
  Filled 2019-10-20: qty 5

## 2019-10-20 MED ORDER — DEXAMETHASONE 4 MG PO TABS
20.0000 mg | ORAL_TABLET | Freq: Once | ORAL | Status: AC
  Administered 2019-10-20: 20 mg via ORAL

## 2019-10-20 MED ORDER — DEXAMETHASONE 4 MG PO TABS: ORAL_TABLET | ORAL | 0 refills | Status: DC

## 2019-10-20 MED ORDER — DIPHENHYDRAMINE HCL 25 MG PO CAPS
25.0000 mg | ORAL_CAPSULE | Freq: Once | ORAL | Status: AC
  Administered 2019-10-20: 25 mg via ORAL

## 2019-10-20 MED ORDER — BORTEZOMIB CHEMO SQ INJECTION 3.5 MG (2.5MG/ML)
0.7800 mg/m2 | Freq: Once | INTRAMUSCULAR | Status: AC
  Administered 2019-10-20: 1.25 mg via SUBCUTANEOUS
  Filled 2019-10-20: qty 0.5

## 2019-10-20 MED ORDER — ACETAMINOPHEN 325 MG PO TABS
650.0000 mg | ORAL_TABLET | Freq: Once | ORAL | Status: AC
  Administered 2019-10-20: 13:00:00 650 mg via ORAL

## 2019-10-20 MED ORDER — PROCHLORPERAZINE MALEATE 10 MG PO TABS
ORAL_TABLET | ORAL | Status: AC
  Filled 2019-10-20: qty 1

## 2019-10-20 MED ORDER — DARATUMUMAB-HYALURONIDASE-FIHJ 1800-30000 MG-UT/15ML ~~LOC~~ SOLN
1800.0000 mg | Freq: Once | SUBCUTANEOUS | Status: AC
  Administered 2019-10-20: 1800 mg via SUBCUTANEOUS
  Filled 2019-10-20: qty 15

## 2019-10-20 NOTE — Assessment & Plan Note (Signed)
Overall, she has stable pancytopenia and gradually improving I also detected low vitamin B12 She will be getting vitamin B12 injection with each visit as well She agreed with the plan of care

## 2019-10-20 NOTE — Assessment & Plan Note (Signed)
She will continue hemodialysis on Tuesdays, Thursdays and Saturdays as scheduled Neither daratumumab nor Velcade needs dose adjustment in the setting of renal failure

## 2019-10-20 NOTE — Progress Notes (Signed)
[place] OFFICE PROGRESS NOTE  Patient Care Team: Lucianne Lei, MD as PCP - General (Family Medicine)  ASSESSMENT & PLAN:  Multiple myeloma not having achieved remission [facility]) I reviewed recent myeloma panel with the patient She had excellent response to treatment and so far has minimum side effects We will proceed with maintenance treatment every 3 weeks She has no infusion reaction and so we will not have to keep her for observation after each dose of Faspro I will continue to monitor myeloma panel once a month I reminded her to take acyclovir along with vitamin D supplement I will taper her off steroids therapy Due to poor renal function, I will not be prescribing Zometa  Pancytopenia, acquired ([place]) Overall, she has stable pancytopenia and gradually improving I also detected low vitamin B12 She will be getting vitamin B12 injection with each visit as well She agreed with the plan of care  ESRD (end stage renal disease) on dialysis [facility]) She will continue hemodialysis on Tuesdays, Thursdays and Saturdays as scheduled Neither daratumumab nor Velcade needs dose adjustment in the setting of renal failure  Peripheral neuropathy due to chemotherapy [facility]) She denies worsening neuropathy while on treatment I am hopeful that vitamin B12 injection will help with her neuropathy as well   Orders Placed This Encounter  Procedures  . Vitamin B12    Standing Status:   Future    Standing Expiration Date:   11/23/2020    INTERVAL HISTORY: Please see below for problem oriented charting. She returns for further follow-up She tolerated recent treatment well Denies worsening neuropathy No recent infection, fever or chills No new bone pain She has chronic hip pain which is stable She denies recent nausea or constipation.  SUMMARY OF ONCOLOGIC HISTORY: Oncology History  Multiple myeloma not having achieved remission ([place])  03/03/2019 Initial Diagnosis   Multiple  myeloma not having achieved remission ([place])   03/03/2019 Imaging   1. Round and oval lucent/lytic lesions in the skull, bilateral humeri, distal left femur, and possibly also in the pelvis are compatible with multiple myeloma. No right lower extremity lytic lesion identified. No pathologic fracture. 2. Advanced degenerative changes in the spine with no vertebral myeloma evident by plain radiography. 3. Advanced degenerative changes in the right upper extremity. Bilateral knee arthroplasty. 4.  Aortic Atherosclerosis (ICD10-I70.0).   03/04/2019 -  Chemotherapy   The patient had bortezomib for chemotherapy treatment.    03/04/2019 Bone Marrow Biopsy   Bone Marrow, Aspirate,Biopsy, and Clot - HYPERCELLULAR BONE MARROW FOR AGE WITH PLASMACYTOSIS. - SEE COMMENT. PERIPHERAL BLOOD: - NORMOCYTIC-NORMOCHROMIC ANEMIA. Diagnosis Note The bone marrow is hypercellular for age with increased number of atypical plasma cells averaging 30% of all cells in the aspirate associated with interstitial infiltrates and numerous variably sized aggregates in the clot and biopsy sections. 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 Confirmatory in situ hybridization for kappa and lambda as well as CD138 will be performed and the results reported in an addendum. The background shows trilineage hematopoiesis with mild non-specific changes primarily involving the erythroid series. Correlation with cytogenetic and FISH studies is recommended.    06/20/2019 -  Chemotherapy   The patient had daratumumab-hyaluronidase-fihj (DARZALEX FASPRO) 1800-30000 MG-UT/15ML chemo SQ injection 1,800 mg, 1,800 mg, Subcutaneous,  Once, 5 of 10 cycles Administration: 1,800 mg (06/20/2019), 1,800 mg (06/27/2019), 1,800 mg (07/04/2019), 1,800 mg (07/11/2019), 1,800 mg (07/18/2019), 1,800 mg (07/25/2019), 1,800 mg (08/08/2019), 1,800 mg (08/15/2019), 1,800 mg (09/05/2019), 1,800 mg (09/26/2019)  for chemotherapy treatment.      REVIEW OF  SYSTEMS:   Constitutional: Denies fevers, chills or abnormal weight loss Eyes: Denies blurriness of vision Ears, nose, mouth, throat, and face: Denies mucositis or sore throat Respiratory: Denies cough, dyspnea or wheezes Cardiovascular: Denies palpitation, chest discomfort or lower extremity swelling Skin: Denies abnormal skin rashes Lymphatics: Denies new lymphadenopathy or easy bruising Behavioral/Psych: Mood is stable, no new changes  All other systems were reviewed with the patient and are negative.  I have reviewed the past medical history, past surgical history, social history and family history with the patient and they are unchanged from previous note.  ALLERGIES:  is allergic to aspirin; hydrocodone-acetaminophen; morphine; penicillins; brimonidine; diltiazem hcl; hydrocodone-acetaminophen; and neurontin [gabapentin].  MEDICATIONS:  Current Outpatient Medications  Medication Sig Dispense Refill  . cholecalciferol (VITAMIN D3) 25 MCG (1000 UT) tablet Take 2,000 Units by mouth daily.    Marland Kitchen acetaminophen (TYLENOL) 500 MG tablet Take 500 mg by mouth 2 (two) times daily as needed for moderate pain.    Marland Kitchen acyclovir (ZOVIRAX) 400 MG tablet TAKE 1 TABLET BY MOUTH EVERY DAY 90 tablet 2  . allopurinol (ZYLOPRIM) 100 MG tablet Take 0.5 tablets (50 mg total) by mouth daily. 30 tablet 0  . B Complex-C-Zn-Folic Acid (DIALYVITE 785-YIFO 15) 0.8 MG TABS Take 1 tablet by mouth every evening.     Marland Kitchen dexamethasone (DECADRON) 4 MG tablet Weekly until it is finished 30 tablet 0  . dorzolamide (TRUSOPT) 2 % ophthalmic solution Place 1 drop into the left eye every evening.   1  . ethyl chloride spray Apply 1 application topically as needed (prior to port being accessed).     . fluorometholone (FML) 0.1 % ophthalmic suspension Place 1 drop into the right eye 3 (three) times daily.     . isosorbide-hydrALAZINE (BIDIL) 20-37.5 MG tablet Take 1 tablet by mouth 2 (two) times a day. 60 tablet 0  . lactulose  (CHRONULAC) 10 GM/15ML solution TAKE 15 MLS (10 G TOTAL) BY MOUTH 2 (TWO) TIMES DAILY. 473 mL 0  . LUMIGAN 0.01 % SOLN Place 1 drop into the left eye every evening.     Marland Kitchen LYRICA 50 MG capsule Take 50 mg by mouth at bedtime as needed (pain/neuropathy).     . ondansetron (ZOFRAN) 4 MG tablet Take 1 tablet (4 mg total) by mouth every 8 (eight) hours as needed for nausea. 30 tablet 1  . pantoprazole (PROTONIX) 40 MG tablet Take 1 tablet (40 mg total) by mouth daily as needed (acid reflux/indigestion.). 30 tablet 11  . polyethylene glycol (MIRALAX / GLYCOLAX) 17 g packet Take 17 g by mouth daily as needed (constipation).    . prochlorperazine (COMPAZINE) 5 MG tablet Take 1 tablet (5 mg total) by mouth every 6 (six) hours as needed for nausea or vomiting. 30 tablet 1  . senna-docusate (SENOKOT-S) 8.6-50 MG tablet Take 2 tablets by mouth 2 (two) times daily. (Patient taking differently: Take 2 tablets by mouth 2 (two) times daily as needed (constipation.). ) 30 tablet 0  . sevelamer carbonate (RENVELA) 800 MG tablet Take 800-1,200 mg by mouth daily.     . sodium chloride (OCEAN) 0.65 % SOLN nasal spray Place 1 spray into both nostrils 4 (four) times daily as needed for congestion.      No current facility-administered medications for this visit.   Facility-Administered Medications Ordered in Other Visits  Medication Dose Route Frequency Provider Last Rate Last Admin  . bortezomib SQ (  VELCADE) chemo injection 1.25 mg  0.78 mg/m2 (Treatment Plan Recorded) Subcutaneous Once Alvy Bimler, Pippa Hanif, MD      . cyanocobalamin ((VITAMIN B-12)) injection 1,000 mcg  1,000 mcg Intramuscular Once Alvy Bimler, Oretta Berkland, MD      . daratumumab-hyaluronidase-fihj (DARZALEX FASPRO) 1800-30000 MG-UT/15ML chemo SQ injection 1,800 mg  1,800 mg Subcutaneous Once Alvy Bimler, Thalia Turkington, MD        PHYSICAL EXAMINATION: ECOG PERFORMANCE STATUS: 2 - Symptomatic, <50% confined to bed  Vitals:   10/20/19 1213  BP: (!) 111/45  Pulse: 71  Resp: 18  Temp:  98 F (36.7 C)  SpO2: 100%   There were no vitals filed for this visit.  GENERAL:alert, no distress and comfortable SKIN: skin color, texture, turgor are normal, no rashes or significant lesions EYES: normal, Conjunctiva are pink and non-injected, sclera clear OROPHARYNX:no exudate, no erythema and lips, buccal mucosa, and tongue normal  NECK: supple, thyroid normal size, non-tender, without nodularity LYMPH:  no palpable lymphadenopathy in the cervical, axillary or inguinal LUNGS: clear to auscultation and percussion with normal breathing effort HEART: regular rate & rhythm and no murmurs and no lower extremity edema ABDOMEN:abdomen soft, non-tender and normal bowel sounds Musculoskeletal:no cyanosis of digits and no clubbing  NEURO: alert & oriented x 3 with fluent speech, no focal motor/sensory deficits  LABORATORY DATA:  I have reviewed the data as listed    Component Value Date/Time   NA 137 09/26/2019 1207   K 3.6 09/26/2019 1207   CL 99 09/26/2019 1207   CO2 31 09/26/2019 1207   GLUCOSE 87 09/26/2019 1207   BUN 9 09/26/2019 1207   CREATININE 2.26 (H) 09/26/2019 1207   CREATININE 1.03 (H) 05/11/2016 0931   CALCIUM 7.8 (L) 09/26/2019 1207   PROT 6.7 09/26/2019 1207   ALBUMIN 2.4 (L) 09/26/2019 1207   AST 20 09/26/2019 1207   ALT 6 09/26/2019 1207   ALKPHOS 48 09/26/2019 1207   BILITOT 0.5 09/26/2019 1207   GFRNONAA 19 (L) 09/26/2019 1207   GFRAA 22 (L) 09/26/2019 1207    No results found for: SPEP, UPEP  Lab Results  Component Value Date   WBC 3.3 (L) 10/20/2019   NEUTROABS 1.8 10/20/2019   HGB 9.7 (L) 10/20/2019   HCT 29.7 (L) 10/20/2019   MCV 91.4 10/20/2019   PLT 154 10/20/2019      Chemistry      Component Value Date/Time   NA 137 09/26/2019 1207   K 3.6 09/26/2019 1207   CL 99 09/26/2019 1207   CO2 31 09/26/2019 1207   BUN 9 09/26/2019 1207   CREATININE 2.26 (H) 09/26/2019 1207   CREATININE 1.03 (H) 05/11/2016 0931      Component Value  Date/Time   CALCIUM 7.8 (L) 09/26/2019 1207   ALKPHOS 48 09/26/2019 1207   AST 20 09/26/2019 1207   ALT 6 09/26/2019 1207   BILITOT 0.5 09/26/2019 1207     All questions were answered. The patient knows to call the clinic with any problems, questions or concerns. No barriers to learning was detected.  I spent 15 minutes counseling the patient face to face. The total time spent in the appointment was 20 minutes and more than 50% was on counseling and review of test results  Heath Lark, MD 10/20/2019 1:01 PM

## 2019-10-20 NOTE — Assessment & Plan Note (Signed)
She denies worsening neuropathy while on treatment I am hopeful that vitamin B12 injection will help with her neuropathy as well

## 2019-10-20 NOTE — Telephone Encounter (Signed)
Scheduled per 1/4 sch msg. Called and spoke with pt, confirmed 1/25 and 2/15 appts

## 2019-10-20 NOTE — Assessment & Plan Note (Signed)
I reviewed recent myeloma panel with the patient She had excellent response to treatment and so far has minimum side effects We will proceed with maintenance treatment every 3 weeks She has no infusion reaction and so we will not have to keep her for observation after each dose of Faspro I will continue to monitor myeloma panel once a month I reminded her to take acyclovir along with vitamin D supplement I will taper her off steroids therapy Due to poor renal function, I will not be prescribing Zometa

## 2019-10-21 ENCOUNTER — Encounter: Payer: Self-pay | Admitting: Hematology and Oncology

## 2019-10-21 LAB — KAPPA/LAMBDA LIGHT CHAINS
Kappa free light chain: 314.3 mg/L — ABNORMAL HIGH (ref 3.3–19.4)
Kappa, lambda light chain ratio: 29.37 — ABNORMAL HIGH (ref 0.26–1.65)
Lambda free light chains: 10.7 mg/L (ref 5.7–26.3)

## 2019-10-22 LAB — MULTIPLE MYELOMA PANEL, SERUM
Albumin SerPl Elph-Mcnc: 2.9 g/dL (ref 2.9–4.4)
Albumin/Glob SerPl: 0.9 (ref 0.7–1.7)
Alpha 1: 0.2 g/dL (ref 0.0–0.4)
Alpha2 Glob SerPl Elph-Mcnc: 0.5 g/dL (ref 0.4–1.0)
B-Globulin SerPl Elph-Mcnc: 2.4 g/dL — ABNORMAL HIGH (ref 0.7–1.3)
Gamma Glob SerPl Elph-Mcnc: 0.2 g/dL — ABNORMAL LOW (ref 0.4–1.8)
Globulin, Total: 3.4 g/dL (ref 2.2–3.9)
IgA: 17 mg/dL — ABNORMAL LOW (ref 64–422)
IgG (Immunoglobin G), Serum: 2426 mg/dL — ABNORMAL HIGH (ref 586–1602)
IgM (Immunoglobulin M), Srm: 10 mg/dL — ABNORMAL LOW (ref 26–217)
M Protein SerPl Elph-Mcnc: 1.6 g/dL — ABNORMAL HIGH
Total Protein ELP: 6.3 g/dL (ref 6.0–8.5)

## 2019-10-24 ENCOUNTER — Other Ambulatory Visit: Payer: Self-pay | Admitting: Hematology and Oncology

## 2019-10-24 DIAGNOSIS — C9000 Multiple myeloma not having achieved remission: Secondary | ICD-10-CM

## 2019-11-07 ENCOUNTER — Ambulatory Visit (INDEPENDENT_AMBULATORY_CARE_PROVIDER_SITE_OTHER): Payer: Medicare HMO | Admitting: *Deleted

## 2019-11-07 DIAGNOSIS — I495 Sick sinus syndrome: Secondary | ICD-10-CM | POA: Diagnosis not present

## 2019-11-07 LAB — CUP PACEART REMOTE DEVICE CHECK
Battery Remaining Longevity: 88 mo
Battery Remaining Percentage: 95.5 %
Battery Voltage: 2.98 V
Brady Statistic AP VP Percent: 79 %
Brady Statistic AP VS Percent: 2.4 %
Brady Statistic AS VP Percent: 8.2 %
Brady Statistic AS VS Percent: 10 %
Brady Statistic RA Percent Paced: 80 %
Brady Statistic RV Percent Paced: 87 %
Date Time Interrogation Session: 20210122020019
Implantable Lead Implant Date: 20100422
Implantable Lead Implant Date: 20100422
Implantable Lead Location: 753859
Implantable Lead Location: 753860
Implantable Pulse Generator Implant Date: 20170801
Lead Channel Impedance Value: 360 Ohm
Lead Channel Impedance Value: 380 Ohm
Lead Channel Pacing Threshold Amplitude: 0.75 V
Lead Channel Pacing Threshold Amplitude: 1 V
Lead Channel Pacing Threshold Pulse Width: 0.5 ms
Lead Channel Pacing Threshold Pulse Width: 0.5 ms
Lead Channel Sensing Intrinsic Amplitude: 11.8 mV
Lead Channel Sensing Intrinsic Amplitude: 3.1 mV
Lead Channel Setting Pacing Amplitude: 2 V
Lead Channel Setting Pacing Amplitude: 2.5 V
Lead Channel Setting Pacing Pulse Width: 0.5 ms
Lead Channel Setting Sensing Sensitivity: 2 mV
Pulse Gen Model: 2272
Pulse Gen Serial Number: 7929552

## 2019-11-10 ENCOUNTER — Encounter: Payer: Self-pay | Admitting: Hematology and Oncology

## 2019-11-10 ENCOUNTER — Other Ambulatory Visit: Payer: Self-pay

## 2019-11-10 ENCOUNTER — Inpatient Hospital Stay: Payer: Medicare HMO

## 2019-11-10 ENCOUNTER — Inpatient Hospital Stay (HOSPITAL_BASED_OUTPATIENT_CLINIC_OR_DEPARTMENT_OTHER): Payer: Medicare HMO | Admitting: Hematology and Oncology

## 2019-11-10 VITALS — BP 118/49 | HR 67 | Temp 98.2°F | Resp 18

## 2019-11-10 DIAGNOSIS — C9000 Multiple myeloma not having achieved remission: Secondary | ICD-10-CM | POA: Diagnosis not present

## 2019-11-10 DIAGNOSIS — Z992 Dependence on renal dialysis: Secondary | ICD-10-CM

## 2019-11-10 DIAGNOSIS — Z5111 Encounter for antineoplastic chemotherapy: Secondary | ICD-10-CM | POA: Diagnosis not present

## 2019-11-10 DIAGNOSIS — Z7189 Other specified counseling: Secondary | ICD-10-CM

## 2019-11-10 DIAGNOSIS — D61818 Other pancytopenia: Secondary | ICD-10-CM | POA: Diagnosis not present

## 2019-11-10 DIAGNOSIS — R110 Nausea: Secondary | ICD-10-CM

## 2019-11-10 DIAGNOSIS — K5909 Other constipation: Secondary | ICD-10-CM

## 2019-11-10 DIAGNOSIS — N186 End stage renal disease: Secondary | ICD-10-CM

## 2019-11-10 LAB — COMPREHENSIVE METABOLIC PANEL
ALT: 8 U/L (ref 0–44)
AST: 17 U/L (ref 15–41)
Albumin: 3 g/dL — ABNORMAL LOW (ref 3.5–5.0)
Alkaline Phosphatase: 48 U/L (ref 38–126)
Anion gap: 11 (ref 5–15)
BUN: 21 mg/dL (ref 8–23)
CO2: 33 mmol/L — ABNORMAL HIGH (ref 22–32)
Calcium: 8.5 mg/dL — ABNORMAL LOW (ref 8.9–10.3)
Chloride: 96 mmol/L — ABNORMAL LOW (ref 98–111)
Creatinine, Ser: 3.54 mg/dL (ref 0.44–1.00)
GFR calc Af Amer: 13 mL/min — ABNORMAL LOW (ref >60)
GFR calc non Af Amer: 11 mL/min — ABNORMAL LOW (ref >60)
Glucose, Bld: 104 mg/dL — ABNORMAL HIGH (ref 70–99)
Potassium: 3.7 mmol/L (ref 3.5–5.1)
Sodium: 140 mmol/L (ref 135–145)
Total Bilirubin: 0.5 mg/dL (ref 0.3–1.2)
Total Protein: 6.9 g/dL (ref 6.5–8.1)

## 2019-11-10 LAB — CBC WITH DIFFERENTIAL/PLATELET
Abs Immature Granulocytes: 0.01 10*3/uL (ref 0.00–0.07)
Basophils Absolute: 0 10*3/uL (ref 0.0–0.1)
Basophils Relative: 1 %
Eosinophils Absolute: 0 10*3/uL (ref 0.0–0.5)
Eosinophils Relative: 1 %
HCT: 30.4 % — ABNORMAL LOW (ref 36.0–46.0)
Hemoglobin: 9.8 g/dL — ABNORMAL LOW (ref 12.0–15.0)
Immature Granulocytes: 0 %
Lymphocytes Relative: 34 %
Lymphs Abs: 1.3 10*3/uL (ref 0.7–4.0)
MCH: 29.8 pg (ref 26.0–34.0)
MCHC: 32.2 g/dL (ref 30.0–36.0)
MCV: 92.4 fL (ref 80.0–100.0)
Monocytes Absolute: 0.4 10*3/uL (ref 0.1–1.0)
Monocytes Relative: 10 %
Neutro Abs: 2.2 10*3/uL (ref 1.7–7.7)
Neutrophils Relative %: 54 %
Platelets: 183 10*3/uL (ref 150–400)
RBC: 3.29 MIL/uL — ABNORMAL LOW (ref 3.87–5.11)
RDW: 15 % (ref 11.5–15.5)
WBC: 3.9 10*3/uL — ABNORMAL LOW (ref 4.0–10.5)
nRBC: 0 % (ref 0.0–0.2)

## 2019-11-10 MED ORDER — DEXAMETHASONE 4 MG PO TABS
20.0000 mg | ORAL_TABLET | Freq: Once | ORAL | Status: AC
  Administered 2019-11-10: 20 mg via ORAL

## 2019-11-10 MED ORDER — ACETAMINOPHEN 325 MG PO TABS
650.0000 mg | ORAL_TABLET | Freq: Once | ORAL | Status: AC
  Administered 2019-11-10: 650 mg via ORAL

## 2019-11-10 MED ORDER — PROCHLORPERAZINE MALEATE 10 MG PO TABS
10.0000 mg | ORAL_TABLET | Freq: Once | ORAL | Status: AC
  Administered 2019-11-10: 10 mg via ORAL

## 2019-11-10 MED ORDER — DEXAMETHASONE 4 MG PO TABS
ORAL_TABLET | ORAL | Status: AC
  Filled 2019-11-10: qty 5

## 2019-11-10 MED ORDER — ACETAMINOPHEN 325 MG PO TABS
ORAL_TABLET | ORAL | Status: AC
  Filled 2019-11-10: qty 2

## 2019-11-10 MED ORDER — DARATUMUMAB-HYALURONIDASE-FIHJ 1800-30000 MG-UT/15ML ~~LOC~~ SOLN
1800.0000 mg | Freq: Once | SUBCUTANEOUS | Status: AC
  Administered 2019-11-10: 1800 mg via SUBCUTANEOUS
  Filled 2019-11-10: qty 15

## 2019-11-10 MED ORDER — PROCHLORPERAZINE MALEATE 10 MG PO TABS
ORAL_TABLET | ORAL | Status: AC
  Filled 2019-11-10: qty 1

## 2019-11-10 MED ORDER — CYANOCOBALAMIN 1000 MCG/ML IJ SOLN
INTRAMUSCULAR | Status: AC
  Filled 2019-11-10: qty 1

## 2019-11-10 MED ORDER — DIPHENHYDRAMINE HCL 25 MG PO CAPS
ORAL_CAPSULE | ORAL | Status: AC
  Filled 2019-11-10: qty 1

## 2019-11-10 MED ORDER — DIPHENHYDRAMINE HCL 25 MG PO CAPS
25.0000 mg | ORAL_CAPSULE | Freq: Once | ORAL | Status: AC
  Administered 2019-11-10: 25 mg via ORAL

## 2019-11-10 MED ORDER — CYANOCOBALAMIN 1000 MCG/ML IJ SOLN
1000.0000 ug | Freq: Once | INTRAMUSCULAR | Status: AC
  Administered 2019-11-10: 1000 ug via INTRAMUSCULAR

## 2019-11-10 MED ORDER — BORTEZOMIB CHEMO SQ INJECTION 3.5 MG (2.5MG/ML)
0.7800 mg/m2 | Freq: Once | INTRAMUSCULAR | Status: AC
  Administered 2019-11-10: 1.25 mg via SUBCUTANEOUS
  Filled 2019-11-10: qty 0.5

## 2019-11-10 NOTE — Assessment & Plan Note (Signed)
Overall, she has stable pancytopenia and gradually improving She will be getting vitamin B12 injection with each visit as well She agreed with the plan of care

## 2019-11-10 NOTE — Progress Notes (Signed)
[place] OFFICE PROGRESS NOTE  Patient Care Team: Lucianne Lei, MD as PCP - General (Family Medicine)  ASSESSMENT & PLAN:  Multiple myeloma not having achieved remission ([place]) I reviewed recent myeloma panel with the patient She had excellent response to treatment and so far has minimum side effects We will proceed with maintenance treatment every 3 weeks She has no infusion reaction and so we will not have to keep her for observation after each dose of Faspro I will continue to monitor myeloma panel once a month I reminded her to take acyclovir along with vitamin D supplement She is to taper off dexamethasone Due to poor renal function, I will not be prescribing Zometa  Pancytopenia, acquired ([place]) Overall, she has stable pancytopenia and gradually improving She will be getting vitamin B12 injection with each visit as well She agreed with the plan of care  ESRD (end stage renal disease) on dialysis [facility]) She will continue hemodialysis on Tuesdays, Thursdays and Saturdays as scheduled Neither daratumumab nor Velcade needs dose adjustment in the setting of renal failure  Other constipation She continues to have mild intermittent constipation but is resolved nicely with laxative She will continue the same   No orders of the defined types were placed in this encounter.   All questions were answered. The patient knows to call the clinic with any problems, questions or concerns. The total time spent in the appointment was 20 minutes encounter with patients including review of chart and various tests results, discussions about plan of care and coordination of care plan   Heath Lark, MD 11/10/2019 1:12 PM  INTERVAL HISTORY: Please see below for problem oriented charting. She returns for chemotherapy and follow-up So far, she tolerated treatment very well She continues to have intermittent hip pain but that is stable Her dialysis is going well Her energy level is  fair Her bowel habits has improved although she is still needing to take laxatives on a regular basis No worsening neuropathy  SUMMARY OF ONCOLOGIC HISTORY: Oncology History  Multiple myeloma not having achieved remission ([place])  03/03/2019 Initial Diagnosis   Multiple myeloma not having achieved remission ([place])   03/03/2019 Imaging   1. Round and oval lucent/lytic lesions in the skull, bilateral humeri, distal left femur, and possibly also in the pelvis are compatible with multiple myeloma. No right lower extremity lytic lesion identified. No pathologic fracture. 2. Advanced degenerative changes in the spine with no vertebral myeloma evident by plain radiography. 3. Advanced degenerative changes in the right upper extremity. Bilateral knee arthroplasty. 4.  Aortic Atherosclerosis (ICD10-I70.0).   03/04/2019 -  Chemotherapy   The patient had bortezomib for chemotherapy treatment.    03/04/2019 Bone Marrow Biopsy   Bone Marrow, Aspirate,Biopsy, and Clot - HYPERCELLULAR BONE MARROW FOR AGE WITH PLASMACYTOSIS. - SEE COMMENT. PERIPHERAL BLOOD: - NORMOCYTIC-NORMOCHROMIC ANEMIA. Diagnosis Note The bone marrow is hypercellular for age with increased number of atypical plasma cells averaging 30% of all cells in the aspirate associated with interstitial infiltrates and numerous variably sized aggregates in the clot and biopsy sections. 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 Confirmatory in situ hybridization for kappa and lambda as well as CD138 will be performed and the results reported in an addendum. The background shows trilineage hematopoiesis with mild non-specific changes primarily involving the erythroid series. Correlation with cytogenetic and FISH studies is recommended.    06/20/2019 -  Chemotherapy   The patient had daratumumab-hyaluronidase-fihj (DARZALEX FASPRO) 1800-30000 MG-UT/15ML chemo SQ injection  1,800 mg, 1,800 mg, Subcutaneous,  Once, 5 of 10  cycles Administration: 1,800 mg (06/20/2019), 1,800 mg (06/27/2019), 1,800 mg (07/04/2019), 1,800 mg (07/11/2019), 1,800 mg (07/18/2019), 1,800 mg (07/25/2019), 1,800 mg (08/08/2019), 1,800 mg (08/15/2019), 1,800 mg (09/05/2019), 1,800 mg (09/26/2019), 1,800 mg (10/20/2019)  for chemotherapy treatment.      REVIEW OF SYSTEMS:   Constitutional: Denies fevers, chills or abnormal weight loss Eyes: Denies blurriness of vision Ears, nose, mouth, throat, and face: Denies mucositis or sore throat Respiratory: Denies cough, dyspnea or wheezes Cardiovascular: Denies palpitation, chest discomfort or lower extremity swelling Skin: Denies abnormal skin rashes Lymphatics: Denies new lymphadenopathy or easy bruising Neurological:Denies numbness, tingling or new weaknesses Behavioral/Psych: Mood is stable, no new changes  All other systems were reviewed with the patient and are negative.  I have reviewed the past medical history, past surgical history, social history and family history with the patient and they are unchanged from previous note.  ALLERGIES:  is allergic to aspirin; hydrocodone-acetaminophen; morphine; penicillins; brimonidine; diltiazem hcl; hydrocodone-acetaminophen; and neurontin [gabapentin].  MEDICATIONS:  Current Outpatient Medications  Medication Sig Dispense Refill  . acetaminophen (TYLENOL) 500 MG tablet Take 500 mg by mouth 2 (two) times daily as needed for moderate pain.    Marland Kitchen acyclovir (ZOVIRAX) 400 MG tablet TAKE 1 TABLET BY MOUTH EVERY DAY 90 tablet 2  . allopurinol (ZYLOPRIM) 100 MG tablet Take 0.5 tablets (50 mg total) by mouth daily. 30 tablet 0  . B Complex-C-Zn-Folic Acid (DIALYVITE 924-QAST 15) 0.8 MG TABS Take 1 tablet by mouth every evening.     . cholecalciferol (VITAMIN D3) 25 MCG (1000 UT) tablet Take 2,000 Units by mouth daily.    Marland Kitchen dexamethasone (DECADRON) 4 MG tablet Weekly until it is finished 30 tablet 0  . dorzolamide (TRUSOPT) 2 % ophthalmic solution Place 1 drop  into the left eye every evening.   1  . ethyl chloride spray Apply 1 application topically as needed (prior to port being accessed).     . fluorometholone (FML) 0.1 % ophthalmic suspension Place 1 drop into the right eye 3 (three) times daily.     . isosorbide-hydrALAZINE (BIDIL) 20-37.5 MG tablet Take 1 tablet by mouth 2 (two) times a day. 60 tablet 0  . lactulose (CHRONULAC) 10 GM/15ML solution TAKE 15 MLS (10 G TOTAL) BY MOUTH 2 (TWO) TIMES DAILY. 473 mL 0  . LUMIGAN 0.01 % SOLN Place 1 drop into the left eye every evening.     Marland Kitchen LYRICA 50 MG capsule Take 50 mg by mouth at bedtime as needed (pain/neuropathy).     . ondansetron (ZOFRAN) 4 MG tablet Take 1 tablet (4 mg total) by mouth every 8 (eight) hours as needed for nausea. 30 tablet 1  . pantoprazole (PROTONIX) 40 MG tablet Take 1 tablet (40 mg total) by mouth daily as needed (acid reflux/indigestion.). 30 tablet 11  . polyethylene glycol (MIRALAX / GLYCOLAX) 17 g packet Take 17 g by mouth daily as needed (constipation).    . prochlorperazine (COMPAZINE) 5 MG tablet Take 1 tablet (5 mg total) by mouth every 6 (six) hours as needed for nausea or vomiting. 30 tablet 1  . senna-docusate (SENOKOT-S) 8.6-50 MG tablet Take 2 tablets by mouth 2 (two) times daily. (Patient taking differently: Take 2 tablets by mouth 2 (two) times daily as needed (constipation.). ) 30 tablet 0  . sevelamer carbonate (RENVELA) 800 MG tablet Take 800-1,200 mg by mouth daily.     . sodium chloride (OCEAN) 0.65 %  SOLN nasal spray Place 1 spray into both nostrils 4 (four) times daily as needed for congestion.      No current facility-administered medications for this visit.    PHYSICAL EXAMINATION: ECOG PERFORMANCE STATUS: 2 - Symptomatic, <50% confined to bed  Vitals:   11/10/19 1239  BP: (!) 106/50  Pulse: 79  Resp: 18  Temp: 98.5 F (36.9 C)  SpO2: 100%   Filed Weights   11/10/19 1239  Weight: 128 lb 14.4 oz (58.5 kg)    GENERAL:alert, no distress and  comfortable SKIN: skin color, texture, turgor are normal, no rashes or significant lesions EYES: normal, Conjunctiva are pink and non-injected, sclera clear OROPHARYNX:no exudate, no erythema and lips, buccal mucosa, and tongue normal  NECK: supple, thyroid normal size, non-tender, without nodularity LYMPH:  no palpable lymphadenopathy in the cervical, axillary or inguinal LUNGS: clear to auscultation and percussion with normal breathing effort HEART: regular rate & rhythm and no murmurs and no lower extremity edema ABDOMEN:abdomen soft, non-tender and normal bowel sounds Musculoskeletal:no cyanosis of digits and no clubbing  NEURO: alert & oriented x 3 with fluent speech, no focal motor/sensory deficits  LABORATORY DATA:  I have reviewed the data as listed    Component Value Date/Time   NA 140 11/10/2019 1222   K 3.7 11/10/2019 1222   CL 96 (L) 11/10/2019 1222   CO2 33 (H) 11/10/2019 1222   GLUCOSE 104 (H) 11/10/2019 1222   BUN 21 11/10/2019 1222   CREATININE 3.54 (HH) 11/10/2019 1222   CREATININE 2.71 (H) 10/20/2019 1128   CREATININE 1.03 (H) 05/11/2016 0931   CALCIUM 8.5 (L) 11/10/2019 1222   PROT 6.9 11/10/2019 1222   ALBUMIN 3.0 (L) 11/10/2019 1222   AST 17 11/10/2019 1222   AST 18 10/20/2019 1128   ALT 8 11/10/2019 1222   ALT 10 10/20/2019 1128   ALKPHOS 48 11/10/2019 1222   BILITOT 0.5 11/10/2019 1222   BILITOT 0.4 10/20/2019 1128   GFRNONAA 11 (L) 11/10/2019 1222   GFRNONAA 15 (L) 10/20/2019 1128   GFRAA 13 (L) 11/10/2019 1222   GFRAA 18 (L) 10/20/2019 1128    No results found for: SPEP, UPEP  Lab Results  Component Value Date   WBC 3.9 (L) 11/10/2019   NEUTROABS 2.2 11/10/2019   HGB 9.8 (L) 11/10/2019   HCT 30.4 (L) 11/10/2019   MCV 92.4 11/10/2019   PLT 183 11/10/2019      Chemistry      Component Value Date/Time   NA 140 11/10/2019 1222   K 3.7 11/10/2019 1222   CL 96 (L) 11/10/2019 1222   CO2 33 (H) 11/10/2019 1222   BUN 21 11/10/2019 1222    CREATININE 3.54 (HH) 11/10/2019 1222   CREATININE 2.71 (H) 10/20/2019 1128   CREATININE 1.03 (H) 05/11/2016 0931      Component Value Date/Time   CALCIUM 8.5 (L) 11/10/2019 1222   ALKPHOS 48 11/10/2019 1222   AST 17 11/10/2019 1222   AST 18 10/20/2019 1128   ALT 8 11/10/2019 1222   ALT 10 10/20/2019 1128   BILITOT 0.5 11/10/2019 1222   BILITOT 0.4 10/20/2019 1128

## 2019-11-10 NOTE — Assessment & Plan Note (Signed)
I reviewed recent myeloma panel with the patient She had excellent response to treatment and so far has minimum side effects We will proceed with maintenance treatment every 3 weeks She has no infusion reaction and so we will not have to keep her for observation after each dose of Faspro I will continue to monitor myeloma panel once a month I reminded her to take acyclovir along with vitamin D supplement She is to taper off dexamethasone Due to poor renal function, I will not be prescribing Zometa

## 2019-11-10 NOTE — Assessment & Plan Note (Signed)
She will continue hemodialysis on Tuesdays, Thursdays and Saturdays as scheduled Neither daratumumab nor Velcade needs dose adjustment in the setting of renal failure

## 2019-11-10 NOTE — Patient Instructions (Signed)
[facility] Discharge Instructions for Patients Receiving Chemotherapy  Today you received the following chemotherapy agents: Velcade and Faspro.  To help prevent nausea and vomiting after your treatment, we encourage you to take your nausea medication as directed.   If you develop nausea and vomiting that is not controlled by your nausea medication, call the clinic.   BELOW ARE SYMPTOMS THAT SHOULD BE REPORTED IMMEDIATELY:  *FEVER GREATER THAN 100.5 F  *CHILLS WITH OR WITHOUT FEVER  NAUSEA AND VOMITING THAT IS NOT CONTROLLED WITH YOUR NAUSEA MEDICATION  *UNUSUAL SHORTNESS OF BREATH  *UNUSUAL BRUISING OR BLEEDING  TENDERNESS IN MOUTH AND THROAT WITH OR WITHOUT PRESENCE OF ULCERS  *URINARY PROBLEMS  *BOWEL PROBLEMS  UNUSUAL RASH Items with * indicate a potential emergency and should be followed up as soon as possible.  Feel free to call the clinic should you have any questions or concerns. The clinic phone number is [phone].  Please show the [place] at check-in to the Emergency Department and triage nurse.

## 2019-11-10 NOTE — Assessment & Plan Note (Signed)
She continues to have mild intermittent constipation but is resolved nicely with laxative She will continue the same

## 2019-11-11 LAB — KAPPA/LAMBDA LIGHT CHAINS
Kappa free light chain: 307.9 mg/L — ABNORMAL HIGH (ref 3.3–19.4)
Kappa, lambda light chain ratio: 24.24 — ABNORMAL HIGH (ref 0.26–1.65)
Lambda free light chains: 12.7 mg/L (ref 5.7–26.3)

## 2019-11-14 LAB — MULTIPLE MYELOMA PANEL, SERUM
Albumin SerPl Elph-Mcnc: 3.4 g/dL (ref 2.9–4.4)
Albumin/Glob SerPl: 1.1 (ref 0.7–1.7)
Alpha 1: 0.2 g/dL (ref 0.0–0.4)
Alpha2 Glob SerPl Elph-Mcnc: 0.5 g/dL (ref 0.4–1.0)
B-Globulin SerPl Elph-Mcnc: 2.3 g/dL — ABNORMAL HIGH (ref 0.7–1.3)
Gamma Glob SerPl Elph-Mcnc: 0.1 g/dL — ABNORMAL LOW (ref 0.4–1.8)
Globulin, Total: 3.2 g/dL (ref 2.2–3.9)
IgA: 18 mg/dL — ABNORMAL LOW (ref 64–422)
IgG (Immunoglobin G), Serum: 2073 mg/dL — ABNORMAL HIGH (ref 586–1602)
IgM (Immunoglobulin M), Srm: 11 mg/dL — ABNORMAL LOW (ref 26–217)
M Protein SerPl Elph-Mcnc: 1.6 g/dL — ABNORMAL HIGH
Total Protein ELP: 6.6 g/dL (ref 6.0–8.5)

## 2019-11-27 ENCOUNTER — Emergency Department (HOSPITAL_COMMUNITY)
Admission: EM | Admit: 2019-11-27 | Discharge: 2019-11-27 | Disposition: A | Discharge status: Home / Self Care | Payer: Medicare HMO | Attending: Emergency Medicine | Admitting: Emergency Medicine

## 2019-11-27 ENCOUNTER — Encounter (HOSPITAL_COMMUNITY): Payer: Self-pay

## 2019-11-27 ENCOUNTER — Other Ambulatory Visit: Payer: Self-pay

## 2019-11-27 ENCOUNTER — Emergency Department (HOSPITAL_COMMUNITY): Payer: Medicare HMO

## 2019-11-27 DIAGNOSIS — I120 Hypertensive chronic kidney disease with stage 5 chronic kidney disease or end stage renal disease: Secondary | ICD-10-CM | POA: Diagnosis not present

## 2019-11-27 DIAGNOSIS — E1122 Type 2 diabetes mellitus with diabetic chronic kidney disease: Secondary | ICD-10-CM | POA: Insufficient documentation

## 2019-11-27 DIAGNOSIS — Z950 Presence of cardiac pacemaker: Secondary | ICD-10-CM | POA: Insufficient documentation

## 2019-11-27 DIAGNOSIS — Z79899 Other long term (current) drug therapy: Secondary | ICD-10-CM | POA: Insufficient documentation

## 2019-11-27 DIAGNOSIS — K5900 Constipation, unspecified: Secondary | ICD-10-CM | POA: Diagnosis not present

## 2019-11-27 DIAGNOSIS — Z992 Dependence on renal dialysis: Secondary | ICD-10-CM | POA: Insufficient documentation

## 2019-11-27 DIAGNOSIS — N186 End stage renal disease: Secondary | ICD-10-CM | POA: Insufficient documentation

## 2019-11-27 DIAGNOSIS — R109 Unspecified abdominal pain: Secondary | ICD-10-CM | POA: Diagnosis present

## 2019-11-27 LAB — CBC WITH DIFFERENTIAL/PLATELET
Abs Immature Granulocytes: 0.02 10*3/uL (ref 0.00–0.07)
Basophils Absolute: 0 10*3/uL (ref 0.0–0.1)
Basophils Relative: 0 %
Eosinophils Absolute: 0 10*3/uL (ref 0.0–0.5)
Eosinophils Relative: 1 %
HCT: 33.9 % — ABNORMAL LOW (ref 36.0–46.0)
Hemoglobin: 11.1 g/dL — ABNORMAL LOW (ref 12.0–15.0)
Immature Granulocytes: 1 %
Lymphocytes Relative: 27 %
Lymphs Abs: 1.2 10*3/uL (ref 0.7–4.0)
MCH: 29.9 pg (ref 26.0–34.0)
MCHC: 32.7 g/dL (ref 30.0–36.0)
MCV: 91.4 fL (ref 80.0–100.0)
Monocytes Absolute: 0.4 10*3/uL (ref 0.1–1.0)
Monocytes Relative: 9 %
Neutro Abs: 2.7 10*3/uL (ref 1.7–7.7)
Neutrophils Relative %: 62 %
Platelets: 216 10*3/uL (ref 150–400)
RBC: 3.71 MIL/uL — ABNORMAL LOW (ref 3.87–5.11)
RDW: 14.7 % (ref 11.5–15.5)
WBC: 4.4 10*3/uL (ref 4.0–10.5)
nRBC: 0 % (ref 0.0–0.2)

## 2019-11-27 LAB — COMPREHENSIVE METABOLIC PANEL
ALT: 10 U/L (ref 0–44)
AST: 24 U/L (ref 15–41)
Albumin: 3.1 g/dL — ABNORMAL LOW (ref 3.5–5.0)
Alkaline Phosphatase: 46 U/L (ref 38–126)
Anion gap: 16 — ABNORMAL HIGH (ref 5–15)
BUN: 14 mg/dL (ref 8–23)
CO2: 27 mmol/L (ref 22–32)
Calcium: 8.9 mg/dL (ref 8.9–10.3)
Chloride: 95 mmol/L — ABNORMAL LOW (ref 98–111)
Creatinine, Ser: 3.74 mg/dL — ABNORMAL HIGH (ref 0.44–1.00)
GFR calc Af Amer: 12 mL/min — ABNORMAL LOW (ref >60)
GFR calc non Af Amer: 10 mL/min — ABNORMAL LOW (ref >60)
Glucose, Bld: 107 mg/dL — ABNORMAL HIGH (ref 70–99)
Potassium: 3.6 mmol/L (ref 3.5–5.1)
Sodium: 138 mmol/L (ref 135–145)
Total Bilirubin: 1 mg/dL (ref 0.3–1.2)
Total Protein: 6.8 g/dL (ref 6.5–8.1)

## 2019-11-27 LAB — URINALYSIS, ROUTINE W REFLEX MICROSCOPIC
Bilirubin Urine: NEGATIVE
Glucose, UA: NEGATIVE mg/dL
Hgb urine dipstick: NEGATIVE
Ketones, ur: NEGATIVE mg/dL
Nitrite: NEGATIVE
Protein, ur: NEGATIVE mg/dL
Specific Gravity, Urine: 1.01 (ref 1.005–1.030)
pH: 9 — ABNORMAL HIGH (ref 5.0–8.0)

## 2019-11-27 MED ORDER — IOHEXOL 300 MG/ML  SOLN
100.0000 mL | Freq: Once | INTRAMUSCULAR | Status: AC | PRN
  Administered 2019-11-27: 100 mL via INTRAVENOUS

## 2019-11-27 MED ORDER — FENTANYL CITRATE (PF) 100 MCG/2ML IJ SOLN
50.0000 ug | Freq: Once | INTRAMUSCULAR | Status: AC
  Administered 2019-11-27: 50 ug via INTRAVENOUS
  Filled 2019-11-27: qty 2

## 2019-11-27 NOTE — ED Provider Notes (Signed)
Medical screening examination/treatment/procedure(s) were conducted as a shared visit with non-physician practitioner(s) and myself.  I personally evaluated the patient during the encounter.  Clinical Impression:   Final diagnoses:  Constipation, unspecified constipation type      This patient is a pleasant 86 year old female, she has a history of a prior hysterectomy, pacemaker placement over 15 years ago and is on dialysis Tuesdays Thursdays and Saturdays.  She has not missed dialysis.  She presents with abdominal pain distention along with nausea vomiting.  She states she has felt excessively gassy and bloated and on my exam she does not fact have a distended tympanic abdomen with mild diffuse tenderness to palpation.  It is nonfocal and nonperitoneal.  She has no significant edema of the legs, no significant JVD, no pulmonary edema.  Her heart is regular, paced on the cardiac monitor, at this time the patient will need evaluation for the cause of her abdominal pain including labs and a CT scan.  Patient agreeable to the plan  8:45 AM Cardiac monitoring reveals paced rhythm, rate of 70 (Rate & rhythm), as reviewed and interpreted by me. Cardiac monitoring was ordered due to nominal pain and nausea and to monitor patient for dysrhythmia.    Noemi Chapel, MD 12/02/19 [phone]

## 2019-11-27 NOTE — ED Notes (Signed)
Doreatha Offer- [phone]- would like pt updates.

## 2019-11-27 NOTE — ED Notes (Signed)
Pt transported to CT ?

## 2019-11-27 NOTE — ED Triage Notes (Addendum)
Pt arrived via [facility] from home c/o abdominal pain x4 days. Pt also c/o N/V x4 days. PT stated she was seen last week for constipation and had a bowel movement yesterday. Pt took zofran prior to EMS arrival.

## 2019-11-27 NOTE — ED Provider Notes (Signed)
[facility] EMERGENCY DEPARTMENT Provider Note   CSN: [id number] Arrival date & time: 11/27/19  1109     History Chief Complaint  Patient presents with  . Abdominal Pain    [name] is a 86 y.o. female presenting for evaluation of abdominal pain, nausea, vomiting.  Patient states the past 4 days, she has been having persistent lower abdominal pain.  She describes it as cramping.  She states it is a discomfort, not a sharp pain.  She has been having issues with constipation, which is not new for her.  She was constipated for an unknown period of time until 4 days ago when she had a small watery bowel movement.  She again had another small watery bowel movement yesterday.  No improvement of her pain with the bowel movements.  She has been taking MiraLAX to encourage bowel movements.  She reports associated nausea and vomiting.  She took Zofran this morning, which improved her nausea.  She denies fevers, chills, chest pain, shortness breath, cough.  She produces minimal amount of urine, reports no change in this.  She goes to dialysis Tuesday, Thursday, Saturday, last session was on Tuesday.  Normal amount of fluid was pulled off.  He is receiving chemotherapy for multiple myeloma, last treatment was 3 weeks ago, next treatment is scheduled for 4 days from now.  Additional history obtained per chart review.  Patient with a history of diabetes, hypertension, anemia, ESRD on dialysis, multiple myeloma receiving chemo, OSA, bradycardia status post pacemaker placement.  HPI     Past Medical History:  Diagnosis Date  . Anemia   . Benign paroxysmal positional vertigo 04/06/2010  . Benign positional vertigo   . 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 CAROTID BRUIT 02/24/2008  . DIABETES MELLITUS, TYPE II 02/24/2008  . ESRD (end stage renal disease) on dialysis ([place])    tues thurs sat nw kidney center sees [place] [facility]  . Glaucoma   . HYPERTENSION 02/24/2008  . Multiple  myeloma ([place])   . Obstructive sleep apnea   . Pancytopenia ([place])   . Sick sinus syndrome [facility])     Patient Active Problem List   Diagnosis Date Noted  . Vitamin B12 deficiency 10/20/2019  . Deficiency anemia 09/26/2019  . Peripheral neuropathy due to chemotherapy ([place]) 07/04/2019  . Goals of care, counseling/discussion 05/30/2019  . Pressure injury of skin 05/26/2019  . Abdominal pain 05/25/2019  . Yeast infection of the skin 04/04/2019  . Cancer associated pain 03/26/2019  . Nausea in adult 03/14/2019  . ESRD (end stage renal disease) on dialysis ([place]) 03/14/2019  . Other constipation 03/14/2019  . Pancytopenia, acquired ([place]) 03/12/2019  . Multiple myeloma not having achieved remission ([place]) 03/03/2019  . AKI (acute kidney injury) ([place]) 02/25/2019  . Hypokalemia 02/25/2019  . Acute renal failure ([place])   . Anemia   . Chest pain 03/09/2015  . Shingles 03/09/2015  . Sick sinus syndrome ([place]) 02/03/2015  . Shortness of breath 02/01/2014  . Fatigue 02/01/2014  . BENIGN PAROXYSMAL POSITIONAL VERTIGO 04/06/2010  . BRADYCARDIA 01/27/2009  . Diabetes mellitus ([place]) 02/24/2008  . Obstructive sleep apnea 02/24/2008  . 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 Essential hypertension 02/24/2008  . ALLERGIC RHINITIS 02/24/2008  . CAROTID BRUIT 02/24/2008    Past Surgical History:  Procedure Laterality Date  . ABDOMINAL HYSTERECTOMY     1980's  . AV FISTULA PLACEMENT Right 03/11/2019   Procedure: CREATION RIGHT ARM RADIOCEPHALIC ARTERIOVENOUS FISTULA;  Surgeon: Angelia Mould, MD;  Location: [facility];  Service: Vascular;  Laterality: Right;  . BACK SURGERY     x 3  . BIOPSY  06/24/2019   Procedure: BIOPSY;  Surgeon: Ronald Lobo, MD;  Location: [facility] ENDOSCOPY;  Service: Endoscopy;;  . EP IMPLANTABLE DEVICE N/A 05/16/2016   Procedure: PPM Generator Changeout;  Surgeon: Thompson Grayer, MD;  Location: [facility] CV LAB;  Service: Cardiovascular;  Laterality: N/A;  .  ESOPHAGOGASTRODUODENOSCOPY (EGD) WITH PROPOFOL N/A 06/24/2019   Procedure: ESOPHAGOGASTRODUODENOSCOPY (EGD) WITH PROPOFOL;  Surgeon: Ronald Lobo, MD;  Location: [facility];  Service: Endoscopy;  Laterality: N/A;  . FISTULA SUPERFICIALIZATION Right 03/11/2019   Procedure: FISTULA SUPERFICIALIZATION;  Surgeon: Angelia Mould, MD;  Location: [facility];  Service: Vascular;  Laterality: Right;  . IR FLUORO GUIDE CV LINE RIGHT  03/07/2019  . IR US GUIDE VASC ACCESS RIGHT  03/07/2019  . KNEE SURGERY Right    2003  . KNEE SURGERY Left    2011  . PACEMAKER INSERTION       OB History   No obstetric history on file.     Family History  Problem Relation Age of Onset  . Congestive Heart Failure Mother   . Tuberculosis Mother   . Multiple myeloma Mother   . Bone cancer Father   . Arthritis Father   . Breast cancer Sister   . Colon cancer Sister   . Hypertension Sister   . Dementia Sister   . Alcohol abuse Son     Social History   Tobacco Use  . Smoking status: Never Smoker  . Smokeless tobacco: Never Used  Substance Use Topics  . Alcohol use: No  . Drug use: No    Home Medications Prior to Admission medications   Medication Sig Start Date End Date Taking? Authorizing Provider  acetaminophen (TYLENOL) 500 MG tablet Take 500 mg by mouth 2 (two) times daily as needed for moderate pain.    [provider]  acyclovir (ZOVIRAX) 400 MG tablet TAKE 1 TABLET BY MOUTH EVERY DAY 08/11/19   Heath Lark, MD  allopurinol (ZYLOPRIM) 100 MG tablet Take 0.5 tablets (50 mg total) by mouth daily. 03/12/19   Nita Sells, MD  B Complex-C-Zn-Folic Acid (DIALYVITE 967-RFFM 15) 0.8 MG TABS Take 1 tablet by mouth every evening.  03/24/19   [provider]  cholecalciferol (VITAMIN D3) 25 MCG (1000 UT) tablet Take 2,000 Units by mouth daily.    [provider]  dexamethasone (DECADRON) 4 MG tablet Weekly until it is finished 10/20/19   Alvy Bimler, Ni, MD  dorzolamide  (TRUSOPT) 2 % ophthalmic solution Place 1 drop into the left eye every evening.  12/21/17   [provider]  ethyl chloride spray Apply 1 application topically as needed (prior to port being accessed).  06/04/19   [provider]  fluorometholone (FML) 0.1 % ophthalmic suspension Place 1 drop into the right eye 3 (three) times daily.  02/01/19   [provider]  isosorbide-hydrALAZINE (BIDIL) 20-37.5 MG tablet Take 1 tablet by mouth 2 (two) times a day. 03/11/19   Nita Sells, MD  lactulose (CHRONULAC) 10 GM/15ML solution TAKE 15 MLS (10 G TOTAL) BY MOUTH 2 (TWO) TIMES DAILY. 08/14/19   Gorsuch, Ni, MD  LUMIGAN 0.01 % SOLN Place 1 drop into the left eye every evening.  02/01/19   [provider]  LYRICA 50 MG capsule Take 50 mg by mouth at bedtime as needed (pain/neuropathy).  06/14/15  [provider]  ondansetron (ZOFRAN) 4 MG tablet Take 1 tablet (4 mg total) by mouth every 8 (eight) hours as needed for nausea. 03/14/19   Heath Lark, MD  pantoprazole (PROTONIX) 40 MG tablet Take 1 tablet (40 mg total) by mouth daily as needed (acid reflux/indigestion.). 06/26/19   Heath Lark, MD  polyethylene glycol (MIRALAX / GLYCOLAX) 17 g packet Take 17 g by mouth daily as needed (constipation).    [provider]  prochlorperazine (COMPAZINE) 5 MG tablet Take 1 tablet (5 mg total) by mouth every 6 (six) hours as needed for nausea or vomiting. 03/14/19   Heath Lark, MD  senna-docusate (SENOKOT-S) 8.6-50 MG tablet Take 2 tablets by mouth 2 (two) times daily. Patient taking differently: Take 2 tablets by mouth 2 (two) times daily as needed (constipation.).  03/11/19   Nita Sells, MD  sevelamer carbonate (RENVELA) 800 MG tablet Take 800-1,200 mg by mouth daily.  04/25/19   [provider]  sodium chloride (OCEAN) 0.65 % SOLN nasal spray Place 1 spray into both nostrils 4 (four) times daily as needed for congestion.     [provider]     Allergies    Aspirin, Hydrocodone-acetaminophen, Morphine, Penicillins, Brimonidine, Diltiazem hcl, Hydrocodone-acetaminophen, and Neurontin [gabapentin]  Review of Systems   Review of Systems  Gastrointestinal: Positive for abdominal distention, abdominal pain, nausea and vomiting.  All other systems reviewed and are negative.   Physical Exam Updated Vital Signs BP (!) 162/65   Pulse 74   Temp 98.4 F (36.9 C) (Oral)   Resp 19   Ht 5' 5"  (1.651 m)   Wt 54 kg   SpO2 100%   BMI 19.80 kg/m   Physical Exam Vitals and nursing note reviewed.  Constitutional:      General: She is not in acute distress.    Appearance: She is well-developed.     Comments: Elderly female who appears uncomfortable due to pain otherwise nontoxic  HENT:     Head: Normocephalic and atraumatic.  Eyes:     Conjunctiva/sclera: Conjunctivae normal.     Pupils: Pupils are equal, round, and reactive to light.  Cardiovascular:     Rate and Rhythm: Normal rate and regular rhythm.     Pulses: Normal pulses.  Pulmonary:     Effort: Pulmonary effort is normal. No respiratory distress.     Breath sounds: Normal breath sounds. No wheezing.  Abdominal:     General: Bowel sounds are normal. There is no distension.     Palpations: Abdomen is soft.     Tenderness: There is abdominal tenderness.     Comments: generalized ttp of the abd, worse in the lower abd. No rigidity, guarding, distention, negative rebound.   Musculoskeletal:        General: Normal range of motion.     Cervical back: Normal range of motion and neck supple.     Right lower leg: No edema.     Left lower leg: No edema.  Skin:    General: Skin is warm and dry.     Capillary Refill: Capillary refill takes less than 2 seconds.  Neurological:     Mental Status: She is alert and oriented to person, place, and time.     ED Results / Procedures / Treatments   Labs (all labs ordered are listed, but only abnormal results are  displayed) Labs Reviewed  CBC WITH DIFFERENTIAL/PLATELET - Abnormal; Notable for the following components:      Result Value  RBC 3.71 (*)    Hemoglobin 11.1 (*)    HCT 33.9 (*)    All other components within normal limits  COMPREHENSIVE METABOLIC PANEL - Abnormal; Notable for the following components:   Chloride 95 (*)    Glucose, Bld 107 (*)    Creatinine, Ser 3.74 (*)    Albumin 3.1 (*)    GFR calc non Af Amer 10 (*)    GFR calc Af Amer 12 (*)    Anion gap 16 (*)    All other components within normal limits  URINALYSIS, ROUTINE W REFLEX MICROSCOPIC - Abnormal; Notable for the following components:   pH 9.0 (*)    Leukocytes,Ua SMALL (*)    Bacteria, UA RARE (*)    All other components within normal limits  URINE CULTURE    EKG None  Radiology CT ABDOMEN PELVIS W CONTRAST  Result Date: 11/27/2019 CLINICAL DATA:  Left lower quadrant abdominal pain and constipation with nausea and vomiting for 4 days. Suspect diverticulitis. Dialysis patient. Multiple myeloma. EXAM: CT ABDOMEN AND PELVIS WITH CONTRAST TECHNIQUE: Multidetector CT imaging of the abdomen and pelvis was performed using the standard protocol following bolus administration of intravenous contrast. CONTRAST:  175m OMNIPAQUE IOHEXOL 300 MG/ML  SOLN COMPARISON:  05/25/2019 CT abdomen/pelvis. FINDINGS: Lower chest: Cardiomegaly. Pacemaker lead tips are seen in the right atrium and right ventricle. Coronary atherosclerosis. Stable small parenchymal bands at the dependent lung bases bilaterally compatible with mild scarring or atelectasis. No acute abnormality at the lung bases. Hepatobiliary: Normal liver size. No liver mass. Cholelithiasis. No definite gallbladder wall thickening. No pericholecystic fluid. No biliary ductal dilatation. CBD diameter 5 mm. Pancreas: Normal, with no mass or duct dilation. Spleen: Normal size. No mass. Adrenals/Urinary Tract: Normal adrenals. Symmetric moderate bilateral renal atrophy. No  hydronephrosis. Small simple 1.2 cm anterior interpolar left renal cyst. Subcentimeter hypodense renal cortical lesion in the posterior lower left kidney is too small to characterize and is unchanged, considered benign. No new renal lesions. Normal bladder. Stomach/Bowel: Normal non-distended stomach. Normal caliber small bowel with no small bowel wall thickening. Appendix not discretely visualized. No pericecal inflammatory changes. Large colorectal stool volume. Mild diffuse colonic diverticulosis. No definite large bowel wall thickening or significant pericolonic fat stranding. Vascular/Lymphatic: Atherosclerotic nonaneurysmal abdominal aorta. Patent portal, splenic, hepatic and renal veins. No pathologically enlarged lymph nodes in the abdomen or pelvis. Reproductive: Status post hysterectomy, with no abnormal findings at the vaginal cuff. No adnexal mass. Other: Trace free fluid in the pelvis. No focal fluid collections. No pneumoperitoneum. Mild anasarca. Musculoskeletal: Numerous lytic osseous lesions throughout visualized spine and pelvic girdle with waxing and waning changes since 05/25/2019 CT. For example 3.0 x 1.3 cm left iliac wing lytic lesion (series 3/image 47), previously 3.4 x 2.4 cm, decreased. Right iliac wing 2.0 x 1.7 cm lytic lesion (series 3/image 47), increased from 1.5 x 1.1 cm. Marked thoracolumbar spondylosis. Bone cage in place in the L4-5 disc space. IMPRESSION: 1. Large colorectal stool volume, suggesting constipation. No evidence of bowel obstruction or acute bowel inflammation. Mild diffuse colonic diverticulosis, without evidence of acute diverticulitis. 2. Numerous lytic osseous lesions throughout the visualized spine and pelvic girdle with waxing and waning changes since 05/25/2019 CT, compatible with known multiple myeloma. 3. Cardiomegaly. Coronary atherosclerosis. 4. Evidence of third spacing of fluid with trace pelvic ascites and anasarca. 5. Cholelithiasis. Electronically  Signed   By: JIlona SorrelM.D.   On: 11/27/2019 13:50    Procedures Procedures (including critical care  time)  Medications Ordered in ED Medications  fentaNYL (SUBLIMAZE) injection 50 mcg (50 mcg Intravenous Given 11/27/19 1144)  iohexol (OMNIPAQUE) 300 MG/ML solution 100 mL (100 mLs Intravenous Contrast Given 11/27/19 1312)    ED Course  I have reviewed the triage vital signs and the nursing notes.  Pertinent labs & imaging results that were available during my care of the patient were reviewed by me and considered in my medical decision making (see chart for details).    MDM Rules/Calculators/A&P                      Patient presenting for evaluation of abdominal pain, nausea, vomiting.  Physical exam shows patient who appears uncomfortable, but nontoxic.  She does have abdominal tenderness, mostly in the lower abdomen.  She has associated constipation, consider constipation as cause of symptoms.  Also consider diverticulitis versus bowel obstruction.  Patient with a history of multiple myeloma, consider mass/tumor.  Will obtain labs, urine, CT.  Case discussed with attending, Dr. Sabra Heck evaluated the patient.  Labs interpreted by me, overall reassuring.  No leukocytosis.  Electrolytes stable.  Creatinine elevated, at baseline and patient is ESRD on dialysis.  Hemoglobin mildly low at 11, baseline.  Discussed with patient son with patient's agreement, he is aware of plan and results that are available as of this time.  CT consistent with constipation, shows no obstruction.  No infection or indications for surgical abdomen.   Urine interpreted by me, no obvious infection.  She has small leuks and rare bacteria, but no white cells.  Similar to previous UAs.  Urine culture sent.  Will hold on treatment unless urine culture is positive.  Discussed findings and plan with patient, who is agreeable.  Discussed importance of bowel regimen and close follow-up with primary care as needed.  At this  time, patient appears safe for discharge.  Return precautions given.  Patient states she understands and agrees to plan.  Final Clinical Impression(s) / ED Diagnoses Final diagnoses:  Constipation, unspecified constipation type    Rx / DC Orders ED Discharge Orders    None       Franchot Heidelberg, PA-C 11/27/19 1521    Noemi Chapel, MD 12/02/19 [phone]

## 2019-11-27 NOTE — Discharge Instructions (Addendum)
Continue taking home medications as prescribed. Drink a bottle of magnesium citrate daily, and use 1-2 capfuls of miralax up  to 3 times a day until you are having regular bowel movements.  Follow up with your primary care doctor as needed for further management of your constipation.  Return to the ER if you develop fevers, vomiting, severe/worsening abdominal pain, or any new, worsening, or concerning symptoms.

## 2019-11-28 LAB — URINE CULTURE: Culture: NO GROWTH

## 2019-12-01 ENCOUNTER — Inpatient Hospital Stay: Payer: Medicare HMO

## 2019-12-01 ENCOUNTER — Inpatient Hospital Stay (HOSPITAL_BASED_OUTPATIENT_CLINIC_OR_DEPARTMENT_OTHER): Payer: Medicare HMO | Admitting: Hematology and Oncology

## 2019-12-01 ENCOUNTER — Other Ambulatory Visit: Payer: Self-pay

## 2019-12-01 ENCOUNTER — Inpatient Hospital Stay: Payer: Medicare HMO | Attending: Hematology and Oncology

## 2019-12-01 DIAGNOSIS — T451X5A Adverse effect of antineoplastic and immunosuppressive drugs, initial encounter: Secondary | ICD-10-CM | POA: Insufficient documentation

## 2019-12-01 DIAGNOSIS — C9000 Multiple myeloma not having achieved remission: Secondary | ICD-10-CM | POA: Diagnosis not present

## 2019-12-01 DIAGNOSIS — Z79899 Other long term (current) drug therapy: Secondary | ICD-10-CM | POA: Diagnosis not present

## 2019-12-01 DIAGNOSIS — Z5111 Encounter for antineoplastic chemotherapy: Secondary | ICD-10-CM | POA: Diagnosis present

## 2019-12-01 DIAGNOSIS — N281 Cyst of kidney, acquired: Secondary | ICD-10-CM | POA: Diagnosis not present

## 2019-12-01 DIAGNOSIS — Z992 Dependence on renal dialysis: Secondary | ICD-10-CM

## 2019-12-01 DIAGNOSIS — K8020 Calculus of gallbladder without cholecystitis without obstruction: Secondary | ICD-10-CM | POA: Diagnosis not present

## 2019-12-01 DIAGNOSIS — K5909 Other constipation: Secondary | ICD-10-CM

## 2019-12-01 DIAGNOSIS — K5730 Diverticulosis of large intestine without perforation or abscess without bleeding: Secondary | ICD-10-CM | POA: Diagnosis not present

## 2019-12-01 DIAGNOSIS — G620 Drug-induced polyneuropathy: Secondary | ICD-10-CM

## 2019-12-01 DIAGNOSIS — N186 End stage renal disease: Secondary | ICD-10-CM | POA: Diagnosis not present

## 2019-12-01 DIAGNOSIS — Z7189 Other specified counseling: Secondary | ICD-10-CM

## 2019-12-01 DIAGNOSIS — I2510 Atherosclerotic heart disease of native coronary artery without angina pectoris: Secondary | ICD-10-CM | POA: Diagnosis not present

## 2019-12-01 LAB — COMPREHENSIVE METABOLIC PANEL
ALT: 8 U/L (ref 0–44)
AST: 17 U/L (ref 15–41)
Albumin: 3.4 g/dL — ABNORMAL LOW (ref 3.5–5.0)
Alkaline Phosphatase: 56 U/L (ref 38–126)
Anion gap: 11 (ref 5–15)
BUN: 22 mg/dL (ref 8–23)
CO2: 32 mmol/L (ref 22–32)
Calcium: 8.7 mg/dL — ABNORMAL LOW (ref 8.9–10.3)
Chloride: 96 mmol/L — ABNORMAL LOW (ref 98–111)
Creatinine, Ser: 4.26 mg/dL (ref 0.44–1.00)
GFR calc Af Amer: 10 mL/min — ABNORMAL LOW (ref >60)
GFR calc non Af Amer: 9 mL/min — ABNORMAL LOW (ref >60)
Glucose, Bld: 125 mg/dL — ABNORMAL HIGH (ref 70–99)
Potassium: 3.7 mmol/L (ref 3.5–5.1)
Sodium: 139 mmol/L (ref 135–145)
Total Bilirubin: 0.6 mg/dL (ref 0.3–1.2)
Total Protein: 7.5 g/dL (ref 6.5–8.1)

## 2019-12-01 LAB — CBC WITH DIFFERENTIAL/PLATELET
Abs Immature Granulocytes: 0.01 10*3/uL (ref 0.00–0.07)
Basophils Absolute: 0 10*3/uL (ref 0.0–0.1)
Basophils Relative: 1 %
Eosinophils Absolute: 0 10*3/uL (ref 0.0–0.5)
Eosinophils Relative: 1 %
HCT: 33.1 % — ABNORMAL LOW (ref 36.0–46.0)
Hemoglobin: 10.7 g/dL — ABNORMAL LOW (ref 12.0–15.0)
Immature Granulocytes: 0 %
Lymphocytes Relative: 29 %
Lymphs Abs: 1.1 10*3/uL (ref 0.7–4.0)
MCH: 30.1 pg (ref 26.0–34.0)
MCHC: 32.3 g/dL (ref 30.0–36.0)
MCV: 93.2 fL (ref 80.0–100.0)
Monocytes Absolute: 0.4 10*3/uL (ref 0.1–1.0)
Monocytes Relative: 9 %
Neutro Abs: 2.2 10*3/uL (ref 1.7–7.7)
Neutrophils Relative %: 60 %
Platelets: 215 10*3/uL (ref 150–400)
RBC: 3.55 MIL/uL — ABNORMAL LOW (ref 3.87–5.11)
RDW: 14.4 % (ref 11.5–15.5)
WBC: 3.7 10*3/uL — ABNORMAL LOW (ref 4.0–10.5)
nRBC: 0 % (ref 0.0–0.2)

## 2019-12-01 MED ORDER — SODIUM CHLORIDE 0.9 % IV SOLN
Freq: Once | INTRAVENOUS | Status: DC
  Filled 2019-12-01: qty 250

## 2019-12-01 MED ORDER — ACETAMINOPHEN 325 MG PO TABS
650.0000 mg | ORAL_TABLET | Freq: Once | ORAL | Status: AC
  Administered 2019-12-01: 650 mg via ORAL

## 2019-12-01 MED ORDER — DIPHENHYDRAMINE HCL 25 MG PO CAPS
25.0000 mg | ORAL_CAPSULE | Freq: Once | ORAL | Status: AC
  Administered 2019-12-01: 25 mg via ORAL

## 2019-12-01 MED ORDER — DEXAMETHASONE 4 MG PO TABS
ORAL_TABLET | ORAL | Status: AC
  Filled 2019-12-01: qty 5

## 2019-12-01 MED ORDER — PROCHLORPERAZINE MALEATE 10 MG PO TABS
ORAL_TABLET | ORAL | Status: AC
  Filled 2019-12-01: qty 1

## 2019-12-01 MED ORDER — DEXAMETHASONE 4 MG PO TABS
20.0000 mg | ORAL_TABLET | Freq: Once | ORAL | Status: AC
  Administered 2019-12-01: 20 mg via ORAL

## 2019-12-01 MED ORDER — DARATUMUMAB-HYALURONIDASE-FIHJ 1800-30000 MG-UT/15ML ~~LOC~~ SOLN
1800.0000 mg | Freq: Once | SUBCUTANEOUS | Status: AC
  Administered 2019-12-01: 1800 mg via SUBCUTANEOUS
  Filled 2019-12-01: qty 15

## 2019-12-01 MED ORDER — ACETAMINOPHEN 325 MG PO TABS
ORAL_TABLET | ORAL | Status: AC
  Filled 2019-12-01: qty 2

## 2019-12-01 MED ORDER — DIPHENHYDRAMINE HCL 25 MG PO CAPS
ORAL_CAPSULE | ORAL | Status: AC
  Filled 2019-12-01: qty 1

## 2019-12-01 MED ORDER — BORTEZOMIB CHEMO SQ INJECTION 3.5 MG (2.5MG/ML)
0.7800 mg/m2 | Freq: Once | INTRAMUSCULAR | Status: AC
  Administered 2019-12-01: 15:00:00 1.25 mg via SUBCUTANEOUS
  Filled 2019-12-01: qty 0.5

## 2019-12-01 MED ORDER — PROCHLORPERAZINE MALEATE 10 MG PO TABS
10.0000 mg | ORAL_TABLET | Freq: Once | ORAL | Status: AC
  Administered 2019-12-01: 10 mg via ORAL

## 2019-12-01 NOTE — Patient Instructions (Signed)
[facility] Discharge Instructions for Patients Receiving Chemotherapy  Today you received the following chemotherapy agents: Velcade/Faspro.  To help prevent nausea and vomiting after your treatment, we encourage you to take your nausea medication as directed.   If you develop nausea and vomiting that is not controlled by your nausea medication, call the clinic.   BELOW ARE SYMPTOMS THAT SHOULD BE REPORTED IMMEDIATELY:  *FEVER GREATER THAN 100.5 F  *CHILLS WITH OR WITHOUT FEVER  NAUSEA AND VOMITING THAT IS NOT CONTROLLED WITH YOUR NAUSEA MEDICATION  *UNUSUAL SHORTNESS OF BREATH  *UNUSUAL BRUISING OR BLEEDING  TENDERNESS IN MOUTH AND THROAT WITH OR WITHOUT PRESENCE OF ULCERS  *URINARY PROBLEMS  *BOWEL PROBLEMS  UNUSUAL RASH Items with * indicate a potential emergency and should be followed up as soon as possible.  Feel free to call the clinic should you have any questions or concerns. The clinic phone number is [phone].  Please show the [facility] at check-in to the Emergency Department and triage nurse.

## 2019-12-02 ENCOUNTER — Encounter: Payer: Self-pay | Admitting: Hematology and Oncology

## 2019-12-02 LAB — KAPPA/LAMBDA LIGHT CHAINS
Kappa free light chain: 293.2 mg/L — ABNORMAL HIGH (ref 3.3–19.4)
Kappa, lambda light chain ratio: 17.66 — ABNORMAL HIGH (ref 0.26–1.65)
Lambda free light chains: 16.6 mg/L (ref 5.7–26.3)

## 2019-12-02 NOTE — Assessment & Plan Note (Signed)
She will continue hemodialysis on Tuesdays, Thursdays and Saturdays as scheduled Neither daratumumab nor Velcade needs dose adjustment in the setting of renal failure

## 2019-12-02 NOTE — Assessment & Plan Note (Signed)
She continues to have mild intermittent constipation  I recommend aggressive laxative therapy She has not been taking laxatives on a regular basis recently and went to the ER Her constipation has resolved

## 2019-12-02 NOTE — Assessment & Plan Note (Signed)
I reviewed recent myeloma panel with the patient She had excellent response to treatment and so far has minimum side effects We will proceed with maintenance treatment every 3 weeks She has no infusion reaction and so we will not have to keep her for observation after each dose of Faspro I will continue to monitor myeloma panel once a month I reminded her to take acyclovir along with vitamin D supplement She is to taper off dexamethasone Due to poor renal function, I will not be prescribing Zometa

## 2019-12-02 NOTE — Assessment & Plan Note (Signed)
She denies worsening neuropathy while on treatment I am hopeful that vitamin B12 injection will help with her neuropathy as well

## 2019-12-02 NOTE — Progress Notes (Signed)
[facility] OFFICE PROGRESS NOTE  Patient Care Team: Lucianne Lei, MD as PCP - General (Family Medicine)  ASSESSMENT & PLAN:  Multiple myeloma not having achieved remission [facility]) I reviewed recent myeloma panel with the patient She had excellent response to treatment and so far has minimum side effects We will proceed with maintenance treatment every 3 weeks She has no infusion reaction and so we will not have to keep her for observation after each dose of Faspro I will continue to monitor myeloma panel once a month I reminded her to take acyclovir along with vitamin D supplement She is to taper off dexamethasone Due to poor renal function, I will not be prescribing Zometa  Peripheral neuropathy due to chemotherapy [facility]) She denies worsening neuropathy while on treatment I am hopeful that vitamin B12 injection will help with her neuropathy as well  Other constipation She continues to have mild intermittent constipation  I recommend aggressive laxative therapy She has not been taking laxatives on a regular basis recently and went to the ER Her constipation has resolved  ESRD (end stage renal disease) on dialysis [facility]) She will continue hemodialysis on Tuesdays, Thursdays and Saturdays as scheduled Neither daratumumab nor Velcade needs dose adjustment in the setting of renal failure   No orders of the defined types were placed in this encounter.   All questions were answered. The patient knows to call the clinic with any problems, questions or concerns. The total time spent in the appointment was 20 minutes encounter with patients including review of chart and various tests results, discussions about plan of care and coordination of care plan   Heath Lark, MD 12/02/2019 9:20 AM  INTERVAL HISTORY: Please see below for problem oriented charting. She returns for further follow-up She went to the emergency room recently due to severe constipation She was given  magnesium citrate with good results She denies worsening neuropathy No infusion reaction Her dialysis is going well Her appetite is fair No recent nausea  SUMMARY OF ONCOLOGIC HISTORY: Oncology History  Multiple myeloma not having achieved remission ([place])  03/03/2019 Initial Diagnosis   Multiple myeloma not having achieved remission ([place])   03/03/2019 Imaging   1. Round and oval lucent/lytic lesions in the skull, bilateral humeri, distal left femur, and possibly also in the pelvis are compatible with multiple myeloma. No right lower extremity lytic lesion identified. No pathologic fracture. 2. Advanced degenerative changes in the spine with no vertebral myeloma evident by plain radiography. 3. Advanced degenerative changes in the right upper extremity. Bilateral knee arthroplasty. 4.  Aortic Atherosclerosis (ICD10-I70.0).   03/04/2019 -  Chemotherapy   The patient had bortezomib for chemotherapy treatment.    03/04/2019 Bone Marrow Biopsy   Bone Marrow, Aspirate,Biopsy, and Clot - HYPERCELLULAR BONE MARROW FOR AGE WITH PLASMACYTOSIS. - SEE COMMENT. PERIPHERAL BLOOD: - NORMOCYTIC-NORMOCHROMIC ANEMIA. Diagnosis Note The bone marrow is hypercellular for age with increased number of atypical plasma cells averaging 30% of all cells in the aspirate associated with interstitial infiltrates and numerous variably sized aggregates in the clot and biopsy sections. 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 Confirmatory in situ hybridization for kappa and lambda as well as CD138 will be performed and the results reported in an addendum. The background shows trilineage hematopoiesis with mild non-specific changes primarily involving the erythroid series. Correlation with cytogenetic and FISH studies is recommended.    06/20/2019 -  Chemotherapy   The patient had daratumumab-hyaluronidase-fihj (DARZALEX FASPRO) 1800-30000 MG-UT/15ML chemo SQ  injection 1,800 mg, 1,800 mg, Subcutaneous,   Once, 7 of 10 cycles Administration: 1,800 mg (06/20/2019), 1,800 mg (06/27/2019), 1,800 mg (07/04/2019), 1,800 mg (07/11/2019), 1,800 mg (07/18/2019), 1,800 mg (07/25/2019), 1,800 mg (08/08/2019), 1,800 mg (08/15/2019), 1,800 mg (09/05/2019), 1,800 mg (12/01/2019), 1,800 mg (09/26/2019), 1,800 mg (10/20/2019), 1,800 mg (11/10/2019)  for chemotherapy treatment.      REVIEW OF SYSTEMS:   Constitutional: Denies fevers, chills or abnormal weight loss Eyes: Denies blurriness of vision Ears, nose, mouth, throat, and face: Denies mucositis or sore throat Respiratory: Denies cough, dyspnea or wheezes Cardiovascular: Denies palpitation, chest discomfort or lower extremity swelling Skin: Denies abnormal skin rashes Lymphatics: Denies new lymphadenopathy or easy bruising Behavioral/Psych: Mood is stable, no new changes  All other systems were reviewed with the patient and are negative.  I have reviewed the past medical history, past surgical history, social history and family history with the patient and they are unchanged from previous note.  ALLERGIES:  is allergic to aspirin; hydrocodone-acetaminophen; morphine; penicillins; brimonidine; diltiazem hcl; hydrocodone-acetaminophen; and neurontin [gabapentin].  MEDICATIONS:  Current Outpatient Medications  Medication Sig Dispense Refill  . acetaminophen (TYLENOL) 500 MG tablet Take 500 mg by mouth 2 (two) times daily as needed for moderate pain.    Marland Kitchen acyclovir (ZOVIRAX) 400 MG tablet TAKE 1 TABLET BY MOUTH EVERY DAY 90 tablet 2  . allopurinol (ZYLOPRIM) 100 MG tablet Take 0.5 tablets (50 mg total) by mouth daily. 30 tablet 0  . B Complex-C-Zn-Folic Acid (DIALYVITE 944-HQPR 15) 0.8 MG TABS Take 1 tablet by mouth every evening.     . cholecalciferol (VITAMIN D3) 25 MCG (1000 UT) tablet Take 2,000 Units by mouth daily.    Marland Kitchen dexamethasone (DECADRON) 4 MG tablet Weekly until it is finished 30 tablet 0  . dorzolamide (TRUSOPT) 2 % ophthalmic solution Place 1 drop  into the left eye every evening.   1  . ethyl chloride spray Apply 1 application topically as needed (prior to port being accessed).     . fluorometholone (FML) 0.1 % ophthalmic suspension Place 1 drop into the right eye 3 (three) times daily.     . isosorbide-hydrALAZINE (BIDIL) 20-37.5 MG tablet Take 1 tablet by mouth 2 (two) times a day. 60 tablet 0  . lactulose (CHRONULAC) 10 GM/15ML solution TAKE 15 MLS (10 G TOTAL) BY MOUTH 2 (TWO) TIMES DAILY. 473 mL 0  . LUMIGAN 0.01 % SOLN Place 1 drop into the left eye every evening.     Marland Kitchen LYRICA 50 MG capsule Take 50 mg by mouth at bedtime as needed (pain/neuropathy).     . ondansetron (ZOFRAN) 4 MG tablet Take 1 tablet (4 mg total) by mouth every 8 (eight) hours as needed for nausea. 30 tablet 1  . pantoprazole (PROTONIX) 40 MG tablet Take 1 tablet (40 mg total) by mouth daily as needed (acid reflux/indigestion.). 30 tablet 11  . polyethylene glycol (MIRALAX / GLYCOLAX) 17 g packet Take 17 g by mouth daily as needed (constipation).    . prochlorperazine (COMPAZINE) 5 MG tablet Take 1 tablet (5 mg total) by mouth every 6 (six) hours as needed for nausea or vomiting. 30 tablet 1  . senna-docusate (SENOKOT-S) 8.6-50 MG tablet Take 2 tablets by mouth 2 (two) times daily. (Patient taking differently: Take 2 tablets by mouth 2 (two) times daily as needed (constipation.). ) 30 tablet 0  . sevelamer carbonate (RENVELA) 800 MG tablet Take 800-1,200 mg by mouth daily.     . sodium chloride (OCEAN)  0.65 % SOLN nasal spray Place 1 spray into both nostrils 4 (four) times daily as needed for congestion.      No current facility-administered medications for this visit.    PHYSICAL EXAMINATION: ECOG PERFORMANCE STATUS: 2 - Symptomatic, <50% confined to bed  Vitals:   12/01/19 1254  BP: (!) 121/53  Pulse: 71  Resp: 18  Temp: 98.3 F (36.8 C)  SpO2: 100%   Filed Weights   12/01/19 1254  Weight: 126 lb 12.8 oz (57.5 kg)    GENERAL:alert, no distress and  comfortable SKIN: skin color, texture, turgor are normal, no rashes or significant lesions EYES: normal, Conjunctiva are pink and non-injected, sclera clear OROPHARYNX:no exudate, no erythema and lips, buccal mucosa, and tongue normal  NECK: supple, thyroid normal size, non-tender, without nodularity LYMPH:  no palpable lymphadenopathy in the cervical, axillary or inguinal LUNGS: clear to auscultation and percussion with normal breathing effort HEART: regular rate & rhythm and no murmurs and no lower extremity edema ABDOMEN:abdomen soft, non-tender and normal bowel sounds Musculoskeletal:no cyanosis of digits and no clubbing  NEURO: alert & oriented x 3 with fluent speech, no focal motor/sensory deficits  LABORATORY DATA:  I have reviewed the data as listed    Component Value Date/Time   NA 139 12/01/2019 1224   K 3.7 12/01/2019 1224   CL 96 (L) 12/01/2019 1224   CO2 32 12/01/2019 1224   GLUCOSE 125 (H) 12/01/2019 1224   BUN 22 12/01/2019 1224   CREATININE 4.26 (HH) 12/01/2019 1224   CREATININE 2.71 (H) 10/20/2019 1128   CREATININE 1.03 (H) 05/11/2016 0931   CALCIUM 8.7 (L) 12/01/2019 1224   PROT 7.5 12/01/2019 1224   ALBUMIN 3.4 (L) 12/01/2019 1224   AST 17 12/01/2019 1224   AST 18 10/20/2019 1128   ALT 8 12/01/2019 1224   ALT 10 10/20/2019 1128   ALKPHOS 56 12/01/2019 1224   BILITOT 0.6 12/01/2019 1224   BILITOT 0.4 10/20/2019 1128   GFRNONAA 9 (L) 12/01/2019 1224   GFRNONAA 15 (L) 10/20/2019 1128   GFRAA 10 (L) 12/01/2019 1224   GFRAA 18 (L) 10/20/2019 1128    No results found for: SPEP, UPEP  Lab Results  Component Value Date   WBC 3.7 (L) 12/01/2019   NEUTROABS 2.2 12/01/2019   HGB 10.7 (L) 12/01/2019   HCT 33.1 (L) 12/01/2019   MCV 93.2 12/01/2019   PLT 215 12/01/2019      Chemistry      Component Value Date/Time   NA 139 12/01/2019 1224   K 3.7 12/01/2019 1224   CL 96 (L) 12/01/2019 1224   CO2 32 12/01/2019 1224   BUN 22 12/01/2019 1224   CREATININE  4.26 (HH) 12/01/2019 1224   CREATININE 2.71 (H) 10/20/2019 1128   CREATININE 1.03 (H) 05/11/2016 0931      Component Value Date/Time   CALCIUM 8.7 (L) 12/01/2019 1224   ALKPHOS 56 12/01/2019 1224   AST 17 12/01/2019 1224   AST 18 10/20/2019 1128   ALT 8 12/01/2019 1224   ALT 10 10/20/2019 1128   BILITOT 0.6 12/01/2019 1224   BILITOT 0.4 10/20/2019 1128       RADIOGRAPHIC STUDIES: I have personally reviewed the radiological images as listed and agreed with the findings in the report. CT ABDOMEN PELVIS W CONTRAST  Result Date: 11/27/2019 CLINICAL DATA:  Left lower quadrant abdominal pain and constipation with nausea and vomiting for 4 days. Suspect diverticulitis. Dialysis patient. Multiple myeloma. EXAM: CT ABDOMEN AND PELVIS  WITH CONTRAST TECHNIQUE: Multidetector CT imaging of the abdomen and pelvis was performed using the standard protocol following bolus administration of intravenous contrast. CONTRAST:  154m OMNIPAQUE IOHEXOL 300 MG/ML  SOLN COMPARISON:  05/25/2019 CT abdomen/pelvis. FINDINGS: Lower chest: Cardiomegaly. Pacemaker lead tips are seen in the right atrium and right ventricle. Coronary atherosclerosis. Stable small parenchymal bands at the dependent lung bases bilaterally compatible with mild scarring or atelectasis. No acute abnormality at the lung bases. Hepatobiliary: Normal liver size. No liver mass. Cholelithiasis. No definite gallbladder wall thickening. No pericholecystic fluid. No biliary ductal dilatation. CBD diameter 5 mm. Pancreas: Normal, with no mass or duct dilation. Spleen: Normal size. No mass. Adrenals/Urinary Tract: Normal adrenals. Symmetric moderate bilateral renal atrophy. No hydronephrosis. Small simple 1.2 cm anterior interpolar left renal cyst. Subcentimeter hypodense renal cortical lesion in the posterior lower left kidney is too small to characterize and is unchanged, considered benign. No new renal lesions. Normal bladder. Stomach/Bowel: Normal  non-distended stomach. Normal caliber small bowel with no small bowel wall thickening. Appendix not discretely visualized. No pericecal inflammatory changes. Large colorectal stool volume. Mild diffuse colonic diverticulosis. No definite large bowel wall thickening or significant pericolonic fat stranding. Vascular/Lymphatic: Atherosclerotic nonaneurysmal abdominal aorta. Patent portal, splenic, hepatic and renal veins. No pathologically enlarged lymph nodes in the abdomen or pelvis. Reproductive: Status post hysterectomy, with no abnormal findings at the vaginal cuff. No adnexal mass. Other: Trace free fluid in the pelvis. No focal fluid collections. No pneumoperitoneum. Mild anasarca. Musculoskeletal: Numerous lytic osseous lesions throughout visualized spine and pelvic girdle with waxing and waning changes since 05/25/2019 CT. For example 3.0 x 1.3 cm left iliac wing lytic lesion (series 3/image 47), previously 3.4 x 2.4 cm, decreased. Right iliac wing 2.0 x 1.7 cm lytic lesion (series 3/image 47), increased from 1.5 x 1.1 cm. Marked thoracolumbar spondylosis. Bone cage in place in the L4-5 disc space. IMPRESSION: 1. Large colorectal stool volume, suggesting constipation. No evidence of bowel obstruction or acute bowel inflammation. Mild diffuse colonic diverticulosis, without evidence of acute diverticulitis. 2. Numerous lytic osseous lesions throughout the visualized spine and pelvic girdle with waxing and waning changes since 05/25/2019 CT, compatible with known multiple myeloma. 3. Cardiomegaly. Coronary atherosclerosis. 4. Evidence of third spacing of fluid with trace pelvic ascites and anasarca. 5. Cholelithiasis. Electronically Signed   By: JIlona SorrelM.D.   On: 11/27/2019 13:50   CUP PACEART REMOTE DEVICE CHECK  Result Date: 11/07/2019 Scheduled remote reviewed.  Normal device function.  AF Burden <1%. Longest <30 mins since last remote. No OAC. Next remote 91 days.

## 2019-12-04 LAB — MULTIPLE MYELOMA PANEL, SERUM
Albumin SerPl Elph-Mcnc: 3.6 g/dL (ref 2.9–4.4)
Albumin/Glob SerPl: 1.1 (ref 0.7–1.7)
Alpha 1: 0.2 g/dL (ref 0.0–0.4)
Alpha2 Glob SerPl Elph-Mcnc: 0.6 g/dL (ref 0.4–1.0)
B-Globulin SerPl Elph-Mcnc: 2.4 g/dL — ABNORMAL HIGH (ref 0.7–1.3)
Gamma Glob SerPl Elph-Mcnc: 0.2 g/dL — ABNORMAL LOW (ref 0.4–1.8)
Globulin, Total: 3.5 g/dL (ref 2.2–3.9)
IgA: 19 mg/dL — ABNORMAL LOW (ref 64–422)
IgG (Immunoglobin G), Serum: 2147 mg/dL — ABNORMAL HIGH (ref 586–1602)
IgM (Immunoglobulin M), Srm: 13 mg/dL — ABNORMAL LOW (ref 26–217)
M Protein SerPl Elph-Mcnc: 1.9 g/dL — ABNORMAL HIGH
Total Protein ELP: 7.1 g/dL (ref 6.0–8.5)

## 2019-12-08 ENCOUNTER — Telehealth: Payer: Self-pay | Admitting: Hematology and Oncology

## 2019-12-08 NOTE — Telephone Encounter (Signed)
Scheduled appt per 2/15 sch message - called pt . No answer . Left message and mailed reminder letter

## 2019-12-22 ENCOUNTER — Inpatient Hospital Stay: Payer: Medicare HMO

## 2019-12-22 ENCOUNTER — Other Ambulatory Visit: Payer: Self-pay

## 2019-12-22 ENCOUNTER — Inpatient Hospital Stay: Payer: Medicare HMO | Attending: Hematology and Oncology

## 2019-12-22 ENCOUNTER — Inpatient Hospital Stay (HOSPITAL_BASED_OUTPATIENT_CLINIC_OR_DEPARTMENT_OTHER): Payer: Medicare HMO | Admitting: Hematology and Oncology

## 2019-12-22 DIAGNOSIS — Z992 Dependence on renal dialysis: Secondary | ICD-10-CM

## 2019-12-22 DIAGNOSIS — K8020 Calculus of gallbladder without cholecystitis without obstruction: Secondary | ICD-10-CM | POA: Insufficient documentation

## 2019-12-22 DIAGNOSIS — R110 Nausea: Secondary | ICD-10-CM

## 2019-12-22 DIAGNOSIS — N281 Cyst of kidney, acquired: Secondary | ICD-10-CM | POA: Insufficient documentation

## 2019-12-22 DIAGNOSIS — K5730 Diverticulosis of large intestine without perforation or abscess without bleeding: Secondary | ICD-10-CM | POA: Diagnosis not present

## 2019-12-22 DIAGNOSIS — Z79899 Other long term (current) drug therapy: Secondary | ICD-10-CM | POA: Insufficient documentation

## 2019-12-22 DIAGNOSIS — T451X5A Adverse effect of antineoplastic and immunosuppressive drugs, initial encounter: Secondary | ICD-10-CM | POA: Insufficient documentation

## 2019-12-22 DIAGNOSIS — G620 Drug-induced polyneuropathy: Secondary | ICD-10-CM | POA: Diagnosis not present

## 2019-12-22 DIAGNOSIS — Z7189 Other specified counseling: Secondary | ICD-10-CM

## 2019-12-22 DIAGNOSIS — Z5111 Encounter for antineoplastic chemotherapy: Secondary | ICD-10-CM | POA: Diagnosis not present

## 2019-12-22 DIAGNOSIS — C9000 Multiple myeloma not having achieved remission: Secondary | ICD-10-CM

## 2019-12-22 DIAGNOSIS — N186 End stage renal disease: Secondary | ICD-10-CM | POA: Insufficient documentation

## 2019-12-22 DIAGNOSIS — I2510 Atherosclerotic heart disease of native coronary artery without angina pectoris: Secondary | ICD-10-CM | POA: Insufficient documentation

## 2019-12-22 DIAGNOSIS — E538 Deficiency of other specified B group vitamins: Secondary | ICD-10-CM | POA: Diagnosis not present

## 2019-12-22 LAB — COMPREHENSIVE METABOLIC PANEL
ALT: 8 U/L (ref 0–44)
AST: 16 U/L (ref 15–41)
Albumin: 3.3 g/dL — ABNORMAL LOW (ref 3.5–5.0)
Alkaline Phosphatase: 51 U/L (ref 38–126)
Anion gap: 10 (ref 5–15)
BUN: 28 mg/dL — ABNORMAL HIGH (ref 8–23)
CO2: 33 mmol/L — ABNORMAL HIGH (ref 22–32)
Calcium: 8.7 mg/dL — ABNORMAL LOW (ref 8.9–10.3)
Chloride: 98 mmol/L (ref 98–111)
Creatinine, Ser: 3.78 mg/dL (ref 0.44–1.00)
GFR calc Af Amer: 12 mL/min — ABNORMAL LOW (ref >60)
GFR calc non Af Amer: 10 mL/min — ABNORMAL LOW (ref >60)
Glucose, Bld: 121 mg/dL — ABNORMAL HIGH (ref 70–99)
Potassium: 3.4 mmol/L — ABNORMAL LOW (ref 3.5–5.1)
Sodium: 141 mmol/L (ref 135–145)
Total Bilirubin: 0.5 mg/dL (ref 0.3–1.2)
Total Protein: 7 g/dL (ref 6.5–8.1)

## 2019-12-22 LAB — CBC WITH DIFFERENTIAL/PLATELET
Abs Immature Granulocytes: 0.01 10*3/uL (ref 0.00–0.07)
Basophils Absolute: 0 10*3/uL (ref 0.0–0.1)
Basophils Relative: 0 %
Eosinophils Absolute: 0.1 10*3/uL (ref 0.0–0.5)
Eosinophils Relative: 2 %
HCT: 35.4 % — ABNORMAL LOW (ref 36.0–46.0)
Hemoglobin: 11.2 g/dL — ABNORMAL LOW (ref 12.0–15.0)
Immature Granulocytes: 0 %
Lymphocytes Relative: 32 %
Lymphs Abs: 1.3 10*3/uL (ref 0.7–4.0)
MCH: 29.5 pg (ref 26.0–34.0)
MCHC: 31.6 g/dL (ref 30.0–36.0)
MCV: 93.2 fL (ref 80.0–100.0)
Monocytes Absolute: 0.4 10*3/uL (ref 0.1–1.0)
Monocytes Relative: 9 %
Neutro Abs: 2.3 10*3/uL (ref 1.7–7.7)
Neutrophils Relative %: 57 %
Platelets: 204 10*3/uL (ref 150–400)
RBC: 3.8 MIL/uL — ABNORMAL LOW (ref 3.87–5.11)
RDW: 13.8 % (ref 11.5–15.5)
WBC: 4.1 10*3/uL (ref 4.0–10.5)
nRBC: 0 % (ref 0.0–0.2)

## 2019-12-22 MED ORDER — DARATUMUMAB-HYALURONIDASE-FIHJ 1800-30000 MG-UT/15ML ~~LOC~~ SOLN
1800.0000 mg | Freq: Once | SUBCUTANEOUS | Status: AC
  Administered 2019-12-22: 1800 mg via SUBCUTANEOUS
  Filled 2019-12-22: qty 15

## 2019-12-22 MED ORDER — PROCHLORPERAZINE MALEATE 10 MG PO TABS
ORAL_TABLET | ORAL | Status: AC
  Filled 2019-12-22: qty 1

## 2019-12-22 MED ORDER — DEXAMETHASONE 4 MG PO TABS
20.0000 mg | ORAL_TABLET | Freq: Once | ORAL | Status: AC
  Administered 2019-12-22: 20 mg via ORAL

## 2019-12-22 MED ORDER — CYANOCOBALAMIN 1000 MCG/ML IJ SOLN
1000.0000 ug | Freq: Once | INTRAMUSCULAR | Status: AC
  Administered 2019-12-22: 1000 ug via INTRAMUSCULAR

## 2019-12-22 MED ORDER — ACETAMINOPHEN 325 MG PO TABS
650.0000 mg | ORAL_TABLET | Freq: Once | ORAL | Status: AC
  Administered 2019-12-22: 650 mg via ORAL

## 2019-12-22 MED ORDER — PROCHLORPERAZINE MALEATE 10 MG PO TABS
10.0000 mg | ORAL_TABLET | Freq: Once | ORAL | Status: AC
  Administered 2019-12-22: 10 mg via ORAL

## 2019-12-22 MED ORDER — ACETAMINOPHEN 325 MG PO TABS
ORAL_TABLET | ORAL | Status: AC
  Filled 2019-12-22: qty 2

## 2019-12-22 MED ORDER — BORTEZOMIB CHEMO SQ INJECTION 3.5 MG (2.5MG/ML)
0.7800 mg/m2 | Freq: Once | INTRAMUSCULAR | Status: AC
  Administered 2019-12-22: 1.25 mg via SUBCUTANEOUS
  Filled 2019-12-22: qty 0.5

## 2019-12-22 MED ORDER — DIPHENHYDRAMINE HCL 25 MG PO CAPS
ORAL_CAPSULE | ORAL | Status: AC
  Filled 2019-12-22: qty 1

## 2019-12-22 MED ORDER — DIPHENHYDRAMINE HCL 25 MG PO CAPS
25.0000 mg | ORAL_CAPSULE | Freq: Once | ORAL | Status: AC
  Administered 2019-12-22: 25 mg via ORAL

## 2019-12-22 MED ORDER — DEXAMETHASONE 4 MG PO TABS
ORAL_TABLET | ORAL | Status: AC
  Filled 2019-12-22: qty 5

## 2019-12-22 NOTE — Patient Instructions (Signed)
COVID-19 Vaccine Information can be found at: [email or web address] For questions related to vaccine distribution or appointments, please email [email or web address] or call [phone].   [facility] Discharge Instructions for Patients Receiving Chemotherapy  Today you received the following chemotherapy agents: Bortezomib (Velcade) and Daratumumab-hyaluronidase-fihj (Darxalex Faspro)  To help prevent nausea and vomiting after your treatment, we encourage you to take your nausea medication as directed by your provider.   If you develop nausea and vomiting that is not controlled by your nausea medication, call the clinic.   BELOW ARE SYMPTOMS THAT SHOULD BE REPORTED IMMEDIATELY:  *FEVER GREATER THAN 100.5 F  *CHILLS WITH OR WITHOUT FEVER  NAUSEA AND VOMITING THAT IS NOT CONTROLLED WITH YOUR NAUSEA MEDICATION  *UNUSUAL SHORTNESS OF BREATH  *UNUSUAL BRUISING OR BLEEDING  TENDERNESS IN MOUTH AND THROAT WITH OR WITHOUT PRESENCE OF ULCERS  *URINARY PROBLEMS  *BOWEL PROBLEMS  UNUSUAL RASH Items with * indicate a potential emergency and should be followed up as soon as possible.  Feel free to call the clinic should you have any questions or concerns. The clinic phone number is [phone].  Please show the [facility] at check-in to the Emergency Department and triage nurse.  Coronavirus (COVID-19) Are you at risk?  Are you at risk for the Coronavirus (COVID-19)?  To be considered HIGH RISK for Coronavirus (COVID-19), you have to meet the following criteria:  . Traveled to [place], [place], [place], [place] or [place]; or in the [place] to [place], [place], [place], or [place]; and have fever, cough, and shortness of breath within the last 2 weeks of travel OR . Been in close contact with a person diagnosed with COVID-19 within the last 2 weeks and have fever, cough, and shortness of  breath . IF YOU DO NOT MEET THESE CRITERIA, YOU ARE CONSIDERED LOW RISK FOR COVID-19.  What to do if you are HIGH RISK for COVID-19?  Marland Kitchen If you are having a medical emergency, call 911. . Seek medical care right away. Before you go to a doctor's office, urgent care or emergency department, call ahead and tell them about your recent travel, contact with someone diagnosed with COVID-19, and your symptoms. You should receive instructions from your physician's office regarding next steps of care.  . When you arrive at healthcare provider, tell the healthcare staff immediately you have returned from visiting [place], [place], [place], [place] or [place]; or traveled in the [place] to [place], [place], [place], or [place]; in the last two weeks or you have been in close contact with a person diagnosed with COVID-19 in the last 2 weeks.   . Tell the health care staff about your symptoms: fever, cough and shortness of breath. . After you have been seen by a medical provider, you will be either: o Tested for (COVID-19) and discharged home on quarantine except to seek medical care if symptoms worsen, and asked to  - Stay home and avoid contact with others until you get your results (4-5 days)  - Avoid travel on public transportation if possible (such as bus, train, or airplane) or o Sent to the Emergency Department by EMS for evaluation, COVID-19 testing, and possible admission depending on your condition and test results.  What to do if you are LOW RISK for COVID-19?  Reduce your risk of any infection by using the same precautions used for avoiding the common cold or flu:  Marland Kitchen Wash your hands often with soap and  warm water for at least 20 seconds.  If soap and water are not readily available, use an alcohol-based hand sanitizer with at least 60% alcohol.  . If coughing or sneezing, cover your mouth and nose by coughing or sneezing into the elbow areas of your shirt or coat, into a tissue or into  your sleeve (not your hands). . Avoid shaking hands with others and consider head nods or verbal greetings only. . Avoid touching your eyes, nose, or mouth with unwashed hands.  . Avoid close contact with people who are sick. . Avoid places or events with large numbers of people in one location, like concerts or sporting events. . Carefully consider travel plans you have or are making. . If you are planning any travel outside or inside the [place], visit the CDC's Travelers' Health webpage for the latest health notices. . If you have some symptoms but not all symptoms, continue to monitor at home and seek medical attention if your symptoms worsen. . If you are having a medical emergency, call 911.   [place] / e-Visit: [email or web address]         [facility]: [facility]: [phone]                   [facility]: [phone]

## 2019-12-23 ENCOUNTER — Encounter: Payer: Self-pay | Admitting: Hematology and Oncology

## 2019-12-23 LAB — KAPPA/LAMBDA LIGHT CHAINS
Kappa free light chain: 355.3 mg/L — ABNORMAL HIGH (ref 3.3–19.4)
Kappa, lambda light chain ratio: 29.12 — ABNORMAL HIGH (ref 0.26–1.65)
Lambda free light chains: 12.2 mg/L (ref 5.7–26.3)

## 2019-12-23 NOTE — Assessment & Plan Note (Signed)
I reviewed recent myeloma panel with the patient She had excellent response to treatment and so far has minimum side effects We will proceed with maintenance treatment every 3 weeks She has no infusion reaction and so we will not have to keep her for observation after each dose of Faspro I will continue to monitor myeloma panel once a month I reminded her to take acyclovir along with vitamin D supplement She is to tapered off dexamethasone Due to poor renal function, I will not be prescribing Zometa

## 2019-12-23 NOTE — Progress Notes (Signed)
[facility] OFFICE PROGRESS NOTE  Patient Care Team: Lucianne Lei, MD as PCP - General (Family Medicine)  ASSESSMENT & PLAN:  Multiple myeloma not having achieved remission [facility]) I reviewed recent myeloma panel with the patient She had excellent response to treatment and so far has minimum side effects We will proceed with maintenance treatment every 3 weeks She has no infusion reaction and so we will not have to keep her for observation after each dose of Faspro I will continue to monitor myeloma panel once a month I reminded her to take acyclovir along with vitamin D supplement She is to tapered off dexamethasone Due to poor renal function, I will not be prescribing Zometa  Peripheral neuropathy due to chemotherapy [facility]) She denies worsening neuropathy while on treatment I am hopeful that vitamin B12 injection will help with her neuropathy as well  Vitamin B12 deficiency She has received multiple vitamin B12 injection I plan to recheck her B12 level again next month  ESRD (end stage renal disease) on dialysis [facility]) She will continue hemodialysis on Tuesdays, Thursdays and Saturdays as scheduled Neither daratumumab nor Velcade needs dose adjustment in the setting of renal failure   No orders of the defined types were placed in this encounter.   All questions were answered. The patient knows to call the clinic with any problems, questions or concerns. The total time spent in the appointment was 20 minutes encounter with patients including review of chart and various tests results, discussions about plan of care and coordination of care plan   Heath Lark, MD 12/23/2019 10:56 AM  INTERVAL HISTORY: Please see below for problem oriented charting. She returns for treatment and follow-up She tolerated treatment well No worsening neuropathy No recent infection, fever or chills Denies recent constipation  SUMMARY OF ONCOLOGIC HISTORY: Oncology History  Multiple  myeloma not having achieved remission ([place])  03/03/2019 Initial Diagnosis   Multiple myeloma not having achieved remission ([place])   03/03/2019 Imaging   1. Round and oval lucent/lytic lesions in the skull, bilateral humeri, distal left femur, and possibly also in the pelvis are compatible with multiple myeloma. No right lower extremity lytic lesion identified. No pathologic fracture. 2. Advanced degenerative changes in the spine with no vertebral myeloma evident by plain radiography. 3. Advanced degenerative changes in the right upper extremity. Bilateral knee arthroplasty. 4.  Aortic Atherosclerosis (ICD10-I70.0).   03/04/2019 -  Chemotherapy   The patient had bortezomib for chemotherapy treatment.    03/04/2019 Bone Marrow Biopsy   Bone Marrow, Aspirate,Biopsy, and Clot - HYPERCELLULAR BONE MARROW FOR AGE WITH PLASMACYTOSIS. - SEE COMMENT. PERIPHERAL BLOOD: - NORMOCYTIC-NORMOCHROMIC ANEMIA. Diagnosis Note The bone marrow is hypercellular for age with increased number of atypical plasma cells averaging 30% of all cells in the aspirate associated with interstitial infiltrates and numerous variably sized aggregates in the clot and biopsy sections. 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 Confirmatory in situ hybridization for kappa and lambda as well as CD138 will be performed and the results reported in an addendum. The background shows trilineage hematopoiesis with mild non-specific changes primarily involving the erythroid series. Correlation with cytogenetic and FISH studies is recommended.    06/20/2019 -  Chemotherapy   The patient had daratumumab-hyaluronidase-fihj (DARZALEX FASPRO) 1800-30000 MG-UT/15ML chemo SQ injection 1,800 mg, 1,800 mg, Subcutaneous,  Once, 8 of 10 cycles Administration: 1,800 mg (06/20/2019), 1,800 mg (06/27/2019), 1,800 mg (07/04/2019), 1,800 mg (07/11/2019), 1,800 mg (07/18/2019), 1,800 mg (07/25/2019), 1,800 mg (08/08/2019), 1,800 mg (  08/15/2019), 1,800 mg  (09/05/2019), 1,800 mg (12/01/2019), 1,800 mg (09/26/2019), 1,800 mg (10/20/2019), 1,800 mg (11/10/2019), 1,800 mg (12/22/2019)  for chemotherapy treatment.      REVIEW OF SYSTEMS:   Constitutional: Denies fevers, chills or abnormal weight loss Eyes: Denies blurriness of vision Ears, nose, mouth, throat, and face: Denies mucositis or sore throat Respiratory: Denies cough, dyspnea or wheezes Cardiovascular: Denies palpitation, chest discomfort or lower extremity swelling Gastrointestinal:  Denies nausea, heartburn or change in bowel habits Skin: Denies abnormal skin rashes Lymphatics: Denies new lymphadenopathy or easy bruising Behavioral/Psych: Mood is stable, no new changes  All other systems were reviewed with the patient and are negative.  I have reviewed the past medical history, past surgical history, social history and family history with the patient and they are unchanged from previous note.  ALLERGIES:  is allergic to aspirin; hydrocodone-acetaminophen; morphine; penicillins; betaine; dacarbazine; ketorolac tromethamine; brimonidine; diltiazem hcl; hydrocodone-acetaminophen; neurontin [gabapentin]; and sulfacetamide sodium.  MEDICATIONS:  Current Outpatient Medications  Medication Sig Dispense Refill  . acetaminophen (TYLENOL) 500 MG tablet Take 500 mg by mouth 2 (two) times daily as needed for moderate pain.    Marland Kitchen acyclovir (ZOVIRAX) 400 MG tablet TAKE 1 TABLET BY MOUTH EVERY DAY 90 tablet 2  . allopurinol (ZYLOPRIM) 100 MG tablet Take 0.5 tablets (50 mg total) by mouth daily. 30 tablet 0  . B Complex-C-Zn-Folic Acid (DIALYVITE 681-LXBW 15) 0.8 MG TABS Take 1 tablet by mouth every evening.     . cholecalciferol (VITAMIN D3) 25 MCG (1000 UT) tablet Take 2,000 Units by mouth daily.    Marland Kitchen dexamethasone (DECADRON) 4 MG tablet Weekly until it is finished 30 tablet 0  . dorzolamide (TRUSOPT) 2 % ophthalmic solution Place 1 drop into the left eye every evening.   1  . ethyl chloride spray  Apply 1 application topically as needed (prior to port being accessed).     . fluorometholone (FML) 0.1 % ophthalmic suspension Place 1 drop into the right eye 3 (three) times daily.     . isosorbide-hydrALAZINE (BIDIL) 20-37.5 MG tablet Take 1 tablet by mouth 2 (two) times a day. 60 tablet 0  . lactulose (CHRONULAC) 10 GM/15ML solution TAKE 15 MLS (10 G TOTAL) BY MOUTH 2 (TWO) TIMES DAILY. 473 mL 0  . LUMIGAN 0.01 % SOLN Place 1 drop into the left eye every evening.     Marland Kitchen LYRICA 50 MG capsule Take 50 mg by mouth at bedtime as needed (pain/neuropathy).     . ondansetron (ZOFRAN) 4 MG tablet Take 1 tablet (4 mg total) by mouth every 8 (eight) hours as needed for nausea. 30 tablet 1  . pantoprazole (PROTONIX) 40 MG tablet Take 1 tablet (40 mg total) by mouth daily as needed (acid reflux/indigestion.). 30 tablet 11  . polyethylene glycol (MIRALAX / GLYCOLAX) 17 g packet Take 17 g by mouth daily as needed (constipation).    . prochlorperazine (COMPAZINE) 5 MG tablet Take 1 tablet (5 mg total) by mouth every 6 (six) hours as needed for nausea or vomiting. 30 tablet 1  . senna-docusate (SENOKOT-S) 8.6-50 MG tablet Take 2 tablets by mouth 2 (two) times daily. (Patient taking differently: Take 2 tablets by mouth 2 (two) times daily as needed (constipation.). ) 30 tablet 0  . sevelamer carbonate (RENVELA) 800 MG tablet Take 800-1,200 mg by mouth daily.     . sodium chloride (OCEAN) 0.65 % SOLN nasal spray Place 1 spray into both nostrils 4 (four) times daily as needed  for congestion.      No current facility-administered medications for this visit.    PHYSICAL EXAMINATION: ECOG PERFORMANCE STATUS: 2 - Symptomatic, <50% confined to bed  Vitals:   12/22/19 1138  BP: (!) 114/49  Pulse: 76  Resp: 18  Temp: 98 F (36.7 C)  SpO2: 99%   Filed Weights   12/22/19 1138  Weight: 127 lb (57.6 kg)    GENERAL:alert, no distress and comfortable NEURO: alert & oriented x 3 with fluent speech, no focal  motor/sensory deficits  LABORATORY DATA:  I have reviewed the data as listed    Component Value Date/Time   NA 141 12/22/2019 1126   K 3.4 (L) 12/22/2019 1126   CL 98 12/22/2019 1126   CO2 33 (H) 12/22/2019 1126   GLUCOSE 121 (H) 12/22/2019 1126   BUN 28 (H) 12/22/2019 1126   CREATININE 3.78 (HH) 12/22/2019 1126   CREATININE 2.71 (H) 10/20/2019 1128   CREATININE 1.03 (H) 05/11/2016 0931   CALCIUM 8.7 (L) 12/22/2019 1126   PROT 7.0 12/22/2019 1126   ALBUMIN 3.3 (L) 12/22/2019 1126   AST 16 12/22/2019 1126   AST 18 10/20/2019 1128   ALT 8 12/22/2019 1126   ALT 10 10/20/2019 1128   ALKPHOS 51 12/22/2019 1126   BILITOT 0.5 12/22/2019 1126   BILITOT 0.4 10/20/2019 1128   GFRNONAA 10 (L) 12/22/2019 1126   GFRNONAA 15 (L) 10/20/2019 1128   GFRAA 12 (L) 12/22/2019 1126   GFRAA 18 (L) 10/20/2019 1128    No results found for: SPEP, UPEP  Lab Results  Component Value Date   WBC 4.1 12/22/2019   NEUTROABS 2.3 12/22/2019   HGB 11.2 (L) 12/22/2019   HCT 35.4 (L) 12/22/2019   MCV 93.2 12/22/2019   PLT 204 12/22/2019      Chemistry      Component Value Date/Time   NA 141 12/22/2019 1126   K 3.4 (L) 12/22/2019 1126   CL 98 12/22/2019 1126   CO2 33 (H) 12/22/2019 1126   BUN 28 (H) 12/22/2019 1126   CREATININE 3.78 (HH) 12/22/2019 1126   CREATININE 2.71 (H) 10/20/2019 1128   CREATININE 1.03 (H) 05/11/2016 0931      Component Value Date/Time   CALCIUM 8.7 (L) 12/22/2019 1126   ALKPHOS 51 12/22/2019 1126   AST 16 12/22/2019 1126   AST 18 10/20/2019 1128   ALT 8 12/22/2019 1126   ALT 10 10/20/2019 1128   BILITOT 0.5 12/22/2019 1126   BILITOT 0.4 10/20/2019 1128       RADIOGRAPHIC STUDIES: I have personally reviewed the radiological images as listed and agreed with the findings in the report. CT ABDOMEN PELVIS W CONTRAST  Result Date: 11/27/2019 CLINICAL DATA:  Left lower quadrant abdominal pain and constipation with nausea and vomiting for 4 days. Suspect  diverticulitis. Dialysis patient. Multiple myeloma. EXAM: CT ABDOMEN AND PELVIS WITH CONTRAST TECHNIQUE: Multidetector CT imaging of the abdomen and pelvis was performed using the standard protocol following bolus administration of intravenous contrast. CONTRAST:  117m OMNIPAQUE IOHEXOL 300 MG/ML  SOLN COMPARISON:  05/25/2019 CT abdomen/pelvis. FINDINGS: Lower chest: Cardiomegaly. Pacemaker lead tips are seen in the right atrium and right ventricle. Coronary atherosclerosis. Stable small parenchymal bands at the dependent lung bases bilaterally compatible with mild scarring or atelectasis. No acute abnormality at the lung bases. Hepatobiliary: Normal liver size. No liver mass. Cholelithiasis. No definite gallbladder wall thickening. No pericholecystic fluid. No biliary ductal dilatation. CBD diameter 5 mm. Pancreas: Normal, with no  mass or duct dilation. Spleen: Normal size. No mass. Adrenals/Urinary Tract: Normal adrenals. Symmetric moderate bilateral renal atrophy. No hydronephrosis. Small simple 1.2 cm anterior interpolar left renal cyst. Subcentimeter hypodense renal cortical lesion in the posterior lower left kidney is too small to characterize and is unchanged, considered benign. No new renal lesions. Normal bladder. Stomach/Bowel: Normal non-distended stomach. Normal caliber small bowel with no small bowel wall thickening. Appendix not discretely visualized. No pericecal inflammatory changes. Large colorectal stool volume. Mild diffuse colonic diverticulosis. No definite large bowel wall thickening or significant pericolonic fat stranding. Vascular/Lymphatic: Atherosclerotic nonaneurysmal abdominal aorta. Patent portal, splenic, hepatic and renal veins. No pathologically enlarged lymph nodes in the abdomen or pelvis. Reproductive: Status post hysterectomy, with no abnormal findings at the vaginal cuff. No adnexal mass. Other: Trace free fluid in the pelvis. No focal fluid collections. No pneumoperitoneum.  Mild anasarca. Musculoskeletal: Numerous lytic osseous lesions throughout visualized spine and pelvic girdle with waxing and waning changes since 05/25/2019 CT. For example 3.0 x 1.3 cm left iliac wing lytic lesion (series 3/image 47), previously 3.4 x 2.4 cm, decreased. Right iliac wing 2.0 x 1.7 cm lytic lesion (series 3/image 47), increased from 1.5 x 1.1 cm. Marked thoracolumbar spondylosis. Bone cage in place in the L4-5 disc space. IMPRESSION: 1. Large colorectal stool volume, suggesting constipation. No evidence of bowel obstruction or acute bowel inflammation. Mild diffuse colonic diverticulosis, without evidence of acute diverticulitis. 2. Numerous lytic osseous lesions throughout the visualized spine and pelvic girdle with waxing and waning changes since 05/25/2019 CT, compatible with known multiple myeloma. 3. Cardiomegaly. Coronary atherosclerosis. 4. Evidence of third spacing of fluid with trace pelvic ascites and anasarca. 5. Cholelithiasis. Electronically Signed   By: Ilona Sorrel M.D.   On: 11/27/2019 13:50

## 2019-12-23 NOTE — Assessment & Plan Note (Signed)
She denies worsening neuropathy while on treatment I am hopeful that vitamin B12 injection will help with her neuropathy as well

## 2019-12-23 NOTE — Assessment & Plan Note (Signed)
She has received multiple vitamin B12 injection I plan to recheck her B12 level again next month

## 2019-12-23 NOTE — Assessment & Plan Note (Signed)
She will continue hemodialysis on Tuesdays, Thursdays and Saturdays as scheduled Neither daratumumab nor Velcade needs dose adjustment in the setting of renal failure

## 2019-12-25 LAB — MULTIPLE MYELOMA PANEL, SERUM
Albumin SerPl Elph-Mcnc: 3.5 g/dL (ref 2.9–4.4)
Albumin/Glob SerPl: 1.1 (ref 0.7–1.7)
Alpha 1: 0.2 g/dL (ref 0.0–0.4)
Alpha2 Glob SerPl Elph-Mcnc: 0.6 g/dL (ref 0.4–1.0)
B-Globulin SerPl Elph-Mcnc: 2.3 g/dL — ABNORMAL HIGH (ref 0.7–1.3)
Gamma Glob SerPl Elph-Mcnc: 0.2 g/dL — ABNORMAL LOW (ref 0.4–1.8)
Globulin, Total: 3.4 g/dL (ref 2.2–3.9)
IgA: 17 mg/dL — ABNORMAL LOW (ref 64–422)
IgG (Immunoglobin G), Serum: 2015 mg/dL — ABNORMAL HIGH (ref 586–1602)
IgM (Immunoglobulin M), Srm: 12 mg/dL — ABNORMAL LOW (ref 26–217)
M Protein SerPl Elph-Mcnc: 1.6 g/dL — ABNORMAL HIGH
Total Protein ELP: 6.9 g/dL (ref 6.0–8.5)

## 2020-01-06 NOTE — Progress Notes (Signed)
Pharmacist Chemotherapy Monitoring - Follow Up Assessment    I verify that I have reviewed each item in the below checklist:  . Regimen for the patient is scheduled for the appropriate day and plan matches scheduled date. Marland Kitchen Appropriate non-routine labs are ordered dependent on drug ordered. . If applicable, additional medications reviewed and ordered per protocol based on lifetime cumulative doses and/or treatment regimen.   Plan for follow-up and/or issues identified: No . I-vent associated with next due treatment: No . MD and/or nursing notified: No  [name] 01/06/2020 1:44 PM

## 2020-01-12 ENCOUNTER — Other Ambulatory Visit: Payer: Self-pay

## 2020-01-12 ENCOUNTER — Telehealth: Payer: Self-pay | Admitting: Hematology and Oncology

## 2020-01-12 ENCOUNTER — Inpatient Hospital Stay: Payer: Medicare HMO

## 2020-01-12 ENCOUNTER — Inpatient Hospital Stay (HOSPITAL_BASED_OUTPATIENT_CLINIC_OR_DEPARTMENT_OTHER): Payer: Medicare HMO | Admitting: Hematology and Oncology

## 2020-01-12 ENCOUNTER — Encounter: Payer: Self-pay | Admitting: Hematology and Oncology

## 2020-01-12 ENCOUNTER — Telehealth: Payer: Self-pay

## 2020-01-12 DIAGNOSIS — C9000 Multiple myeloma not having achieved remission: Secondary | ICD-10-CM

## 2020-01-12 DIAGNOSIS — Z5111 Encounter for antineoplastic chemotherapy: Secondary | ICD-10-CM | POA: Diagnosis not present

## 2020-01-12 DIAGNOSIS — N186 End stage renal disease: Secondary | ICD-10-CM

## 2020-01-12 DIAGNOSIS — Z992 Dependence on renal dialysis: Secondary | ICD-10-CM

## 2020-01-12 DIAGNOSIS — T451X5A Adverse effect of antineoplastic and immunosuppressive drugs, initial encounter: Secondary | ICD-10-CM

## 2020-01-12 DIAGNOSIS — M25552 Pain in left hip: Secondary | ICD-10-CM | POA: Diagnosis not present

## 2020-01-12 DIAGNOSIS — R110 Nausea: Secondary | ICD-10-CM

## 2020-01-12 DIAGNOSIS — G620 Drug-induced polyneuropathy: Secondary | ICD-10-CM | POA: Diagnosis not present

## 2020-01-12 DIAGNOSIS — Z7189 Other specified counseling: Secondary | ICD-10-CM

## 2020-01-12 DIAGNOSIS — E538 Deficiency of other specified B group vitamins: Secondary | ICD-10-CM

## 2020-01-12 LAB — COMPREHENSIVE METABOLIC PANEL
ALT: 13 U/L (ref 0–44)
AST: 21 U/L (ref 15–41)
Albumin: 3.4 g/dL — ABNORMAL LOW (ref 3.5–5.0)
Alkaline Phosphatase: 55 U/L (ref 38–126)
Anion gap: 11 (ref 5–15)
BUN: 35 mg/dL — ABNORMAL HIGH (ref 8–23)
CO2: 29 mmol/L (ref 22–32)
Calcium: 8.8 mg/dL — ABNORMAL LOW (ref 8.9–10.3)
Chloride: 99 mmol/L (ref 98–111)
Creatinine, Ser: 3.92 mg/dL (ref 0.44–1.00)
GFR calc Af Amer: 11 mL/min — ABNORMAL LOW (ref >60)
GFR calc non Af Amer: 10 mL/min — ABNORMAL LOW (ref >60)
Glucose, Bld: 121 mg/dL — ABNORMAL HIGH (ref 70–99)
Potassium: 3.9 mmol/L (ref 3.5–5.1)
Sodium: 139 mmol/L (ref 135–145)
Total Bilirubin: 0.4 mg/dL (ref 0.3–1.2)
Total Protein: 7.4 g/dL (ref 6.5–8.1)

## 2020-01-12 LAB — CBC WITH DIFFERENTIAL/PLATELET
Abs Immature Granulocytes: 0 10*3/uL (ref 0.00–0.07)
Basophils Absolute: 0 10*3/uL (ref 0.0–0.1)
Basophils Relative: 0 %
Eosinophils Absolute: 0.1 10*3/uL (ref 0.0–0.5)
Eosinophils Relative: 2 %
HCT: 35.9 % — ABNORMAL LOW (ref 36.0–46.0)
Hemoglobin: 11.6 g/dL — ABNORMAL LOW (ref 12.0–15.0)
Immature Granulocytes: 0 %
Lymphocytes Relative: 34 %
Lymphs Abs: 1.2 10*3/uL (ref 0.7–4.0)
MCH: 28.8 pg (ref 26.0–34.0)
MCHC: 32.3 g/dL (ref 30.0–36.0)
MCV: 89.1 fL (ref 80.0–100.0)
Monocytes Absolute: 0.4 10*3/uL (ref 0.1–1.0)
Monocytes Relative: 10 %
Neutro Abs: 2 10*3/uL (ref 1.7–7.7)
Neutrophils Relative %: 54 %
Platelets: 148 10*3/uL — ABNORMAL LOW (ref 150–400)
RBC: 4.03 MIL/uL (ref 3.87–5.11)
RDW: 13.3 % (ref 11.5–15.5)
WBC: 3.7 10*3/uL — ABNORMAL LOW (ref 4.0–10.5)
nRBC: 0 % (ref 0.0–0.2)

## 2020-01-12 LAB — VITAMIN B12: Vitamin B-12: 517 pg/mL (ref 180–914)

## 2020-01-12 MED ORDER — DARATUMUMAB-HYALURONIDASE-FIHJ 1800-30000 MG-UT/15ML ~~LOC~~ SOLN
1800.0000 mg | Freq: Once | SUBCUTANEOUS | Status: AC
  Administered 2020-01-12: 1800 mg via SUBCUTANEOUS
  Filled 2020-01-12: qty 15

## 2020-01-12 MED ORDER — DIPHENHYDRAMINE HCL 25 MG PO CAPS
ORAL_CAPSULE | ORAL | Status: AC
  Filled 2020-01-12: qty 1

## 2020-01-12 MED ORDER — CYANOCOBALAMIN 1000 MCG/ML IJ SOLN
1000.0000 ug | Freq: Once | INTRAMUSCULAR | Status: AC
  Administered 2020-01-12: 1000 ug via INTRAMUSCULAR

## 2020-01-12 MED ORDER — PROCHLORPERAZINE MALEATE 10 MG PO TABS
ORAL_TABLET | ORAL | Status: AC
  Filled 2020-01-12: qty 1

## 2020-01-12 MED ORDER — ACETAMINOPHEN 325 MG PO TABS
ORAL_TABLET | ORAL | Status: AC
  Filled 2020-01-12: qty 2

## 2020-01-12 MED ORDER — BORTEZOMIB CHEMO SQ INJECTION 3.5 MG (2.5MG/ML)
0.7800 mg/m2 | Freq: Once | INTRAMUSCULAR | Status: AC
  Administered 2020-01-12: 1.25 mg via SUBCUTANEOUS
  Filled 2020-01-12: qty 0.5

## 2020-01-12 MED ORDER — PROCHLORPERAZINE MALEATE 10 MG PO TABS
10.0000 mg | ORAL_TABLET | Freq: Once | ORAL | Status: AC
  Administered 2020-01-12: 10 mg via ORAL

## 2020-01-12 MED ORDER — DEXAMETHASONE 4 MG PO TABS
ORAL_TABLET | ORAL | Status: AC
  Filled 2020-01-12: qty 1

## 2020-01-12 MED ORDER — DEXAMETHASONE 4 MG PO TABS
20.0000 mg | ORAL_TABLET | Freq: Once | ORAL | Status: AC
  Administered 2020-01-12: 20 mg via ORAL

## 2020-01-12 MED ORDER — CYANOCOBALAMIN 1000 MCG/ML IJ SOLN
INTRAMUSCULAR | Status: AC
  Filled 2020-01-12: qty 1

## 2020-01-12 MED ORDER — ACETAMINOPHEN 325 MG PO TABS
650.0000 mg | ORAL_TABLET | Freq: Once | ORAL | Status: AC
  Administered 2020-01-12: 650 mg via ORAL

## 2020-01-12 MED ORDER — DIPHENHYDRAMINE HCL 25 MG PO CAPS
25.0000 mg | ORAL_CAPSULE | Freq: Once | ORAL | Status: AC
  Administered 2020-01-12: 25 mg via ORAL

## 2020-01-12 MED ORDER — DEXAMETHASONE 4 MG PO TABS
ORAL_TABLET | ORAL | Status: AC
  Filled 2020-01-12: qty 5

## 2020-01-12 NOTE — Patient Instructions (Signed)
COVID-19 Vaccine Information can be found at: [email or web address] For questions related to vaccine distribution or appointments, please email [email or web address] or call [phone].   [facility] Discharge Instructions for Patients Receiving Chemotherapy  Today you received the following chemotherapy agents: Bortezomib (Velcade) and Daratumumab-hyaluronidase-fihj (Darxalex Faspro)  To help prevent nausea and vomiting after your treatment, we encourage you to take your nausea medication as directed by your provider.   If you develop nausea and vomiting that is not controlled by your nausea medication, call the clinic.   BELOW ARE SYMPTOMS THAT SHOULD BE REPORTED IMMEDIATELY:  *FEVER GREATER THAN 100.5 F  *CHILLS WITH OR WITHOUT FEVER  NAUSEA AND VOMITING THAT IS NOT CONTROLLED WITH YOUR NAUSEA MEDICATION  *UNUSUAL SHORTNESS OF BREATH  *UNUSUAL BRUISING OR BLEEDING  TENDERNESS IN MOUTH AND THROAT WITH OR WITHOUT PRESENCE OF ULCERS  *URINARY PROBLEMS  *BOWEL PROBLEMS  UNUSUAL RASH Items with * indicate a potential emergency and should be followed up as soon as possible.  Feel free to call the clinic should you have any questions or concerns. The clinic phone number is [phone].  Please show the [facility] at check-in to the Emergency Department and triage nurse.  Coronavirus (COVID-19) Are you at risk?  Are you at risk for the Coronavirus (COVID-19)?  To be considered HIGH RISK for Coronavirus (COVID-19), you have to meet the following criteria:  . Traveled to [place], [place], [place], [place] or [place]; or in the [place] to [place], [place], [place], or [place]; and have fever, cough, and shortness of breath within the last 2 weeks of travel OR . Been in close contact with a person diagnosed with COVID-19 within the last 2 weeks and have fever, cough, and shortness of  breath . IF YOU DO NOT MEET THESE CRITERIA, YOU ARE CONSIDERED LOW RISK FOR COVID-19.  What to do if you are HIGH RISK for COVID-19?  Marland Kitchen If you are having a medical emergency, call 911. . Seek medical care right away. Before you go to a doctor's office, urgent care or emergency department, call ahead and tell them about your recent travel, contact with someone diagnosed with COVID-19, and your symptoms. You should receive instructions from your physician's office regarding next steps of care.  . When you arrive at healthcare provider, tell the healthcare staff immediately you have returned from visiting [place], [place], [place], [place] or [place]; or traveled in the [place] to [place], [place], [place], or [place]; in the last two weeks or you have been in close contact with a person diagnosed with COVID-19 in the last 2 weeks.   . Tell the health care staff about your symptoms: fever, cough and shortness of breath. . After you have been seen by a medical provider, you will be either: o Tested for (COVID-19) and discharged home on quarantine except to seek medical care if symptoms worsen, and asked to  - Stay home and avoid contact with others until you get your results (4-5 days)  - Avoid travel on public transportation if possible (such as bus, train, or airplane) or o Sent to the Emergency Department by EMS for evaluation, COVID-19 testing, and possible admission depending on your condition and test results.  What to do if you are LOW RISK for COVID-19?  Reduce your risk of any infection by using the same precautions used for avoiding the common cold or flu:  Marland Kitchen Wash your hands often with soap and  warm water for at least 20 seconds.  If soap and water are not readily available, use an alcohol-based hand sanitizer with at least 60% alcohol.  . If coughing or sneezing, cover your mouth and nose by coughing or sneezing into the elbow areas of your shirt or coat, into a tissue or into  your sleeve (not your hands). . Avoid shaking hands with others and consider head nods or verbal greetings only. . Avoid touching your eyes, nose, or mouth with unwashed hands.  . Avoid close contact with people who are sick. . Avoid places or events with large numbers of people in one location, like concerts or sporting events. . Carefully consider travel plans you have or are making. . If you are planning any travel outside or inside the [place], visit the CDC's Travelers' Health webpage for the latest health notices. . If you have some symptoms but not all symptoms, continue to monitor at home and seek medical attention if your symptoms worsen. . If you are having a medical emergency, call 911.   [facility] / e-Visit: [email or web address]         [facility]: [facility]: [phone]                   [facility]: [phone]

## 2020-01-12 NOTE — Assessment & Plan Note (Signed)
She will continue hemodialysis on Tuesdays, Thursdays and Saturdays as scheduled Neither daratumumab nor Velcade needs dose adjustment in the setting of renal failure

## 2020-01-12 NOTE — Assessment & Plan Note (Signed)
The cause of her neuropathy is multifactorial, likely secondary to diabetic neuropathy as well as Velcade We will observe closely for now

## 2020-01-12 NOTE — Telephone Encounter (Signed)
Scheduled appt per 3/29 sch message - pt to get an updated schedule next visit.

## 2020-01-12 NOTE — Assessment & Plan Note (Signed)
She has chronic left hip pain I recommend conservative approach There is no contraindication for her to get injection to her left hip joint if needed She has appointment to see orthopedics for further management

## 2020-01-12 NOTE — Progress Notes (Signed)
[place] OFFICE PROGRESS NOTE  Patient Care Team: Lucianne Lei, MD as PCP - General (Family Medicine)  ASSESSMENT & PLAN:  Multiple myeloma not having achieved remission [facility]) I reviewed recent myeloma panel with the patient She had excellent response to treatment and so far has minimum side effects Her M protein is stable and the light chains fluctuated widely We will proceed with maintenance treatment every 3 weeks She has no infusion reaction and so we will not have to keep her for observation after each dose of Faspro I will continue to monitor myeloma panel once a month I reminded her to take acyclovir along with vitamin D supplement She is to tapered off dexamethasone Due to poor renal function, I will not be prescribing Zometa  ESRD (end stage renal disease) on dialysis [facility]) She will continue hemodialysis on Tuesdays, Thursdays and Saturdays as scheduled Neither daratumumab nor Velcade needs dose adjustment in the setting of renal failure  Peripheral neuropathy due to chemotherapy [facility]) The cause of her neuropathy is multifactorial, likely secondary to diabetic neuropathy as well as Velcade We will observe closely for now  Left hip pain She has chronic left hip pain I recommend conservative approach There is no contraindication for her to get injection to her left hip joint if needed She has appointment to see orthopedics for further management   No orders of the defined types were placed in this encounter.   All questions were answered. The patient knows to call the clinic with any problems, questions or concerns. The total time spent in the appointment was 20 minutes encounter with patients including review of chart and various tests results, discussions about plan of care and coordination of care plan   Heath Lark, MD 01/12/2020 12:06 PM  INTERVAL HISTORY: Please see below for problem oriented charting. She returns for her myeloma treatment  today She has occasional nausea but no vomiting Denies constipation She felt debilitated due to her severe chronic left hip pain She has appointment pending to see orthopedic surgery She tolerated hemodialysis well She has stable peripheral neuropathy affecting her feet  SUMMARY OF ONCOLOGIC HISTORY: Oncology History  Multiple myeloma not having achieved remission ([place])  03/03/2019 Initial Diagnosis   Multiple myeloma not having achieved remission ([place])   03/03/2019 Imaging   1. Round and oval lucent/lytic lesions in the skull, bilateral humeri, distal left femur, and possibly also in the pelvis are compatible with multiple myeloma. No right lower extremity lytic lesion identified. No pathologic fracture. 2. Advanced degenerative changes in the spine with no vertebral myeloma evident by plain radiography. 3. Advanced degenerative changes in the right upper extremity. Bilateral knee arthroplasty. 4.  Aortic Atherosclerosis (ICD10-I70.0).   03/04/2019 -  Chemotherapy   The patient had bortezomib for chemotherapy treatment.    03/04/2019 Bone Marrow Biopsy   Bone Marrow, Aspirate,Biopsy, and Clot - HYPERCELLULAR BONE MARROW FOR AGE WITH PLASMACYTOSIS. - SEE COMMENT. PERIPHERAL BLOOD: - NORMOCYTIC-NORMOCHROMIC ANEMIA. Diagnosis Note The bone marrow is hypercellular for age with increased number of atypical plasma cells averaging 30% of all cells in the aspirate associated with interstitial infiltrates and numerous variably sized aggregates in the clot and biopsy sections. 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 Confirmatory in situ hybridization for kappa and lambda as well as CD138 will be performed and the results reported in an addendum. The background shows trilineage hematopoiesis with mild non-specific changes primarily involving the erythroid series. Correlation with cytogenetic and FISH studies is recommended.  06/20/2019 -  Chemotherapy   The patient had  daratumumab-hyaluronidase-fihj (DARZALEX FASPRO) 1800-30000 MG-UT/15ML chemo SQ injection 1,800 mg, 1,800 mg, Subcutaneous,  Once, 8 of 11 cycles Administration: 1,800 mg (06/20/2019), 1,800 mg (06/27/2019), 1,800 mg (07/04/2019), 1,800 mg (07/11/2019), 1,800 mg (07/18/2019), 1,800 mg (07/25/2019), 1,800 mg (08/08/2019), 1,800 mg (08/15/2019), 1,800 mg (09/05/2019), 1,800 mg (12/01/2019), 1,800 mg (09/26/2019), 1,800 mg (10/20/2019), 1,800 mg (11/10/2019), 1,800 mg (12/22/2019)  for chemotherapy treatment.      REVIEW OF SYSTEMS:   Constitutional: Denies fevers, chills or abnormal weight loss Eyes: Denies blurriness of vision Ears, nose, mouth, throat, and face: Denies mucositis or sore throat Respiratory: Denies cough, dyspnea or wheezes Cardiovascular: Denies palpitation, chest discomfort or lower extremity swelling Skin: Denies abnormal skin rashes Lymphatics: Denies new lymphadenopathy or easy bruising Behavioral/Psych: Mood is stable, no new changes  All other systems were reviewed with the patient and are negative.  I have reviewed the past medical history, past surgical history, social history and family history with the patient and they are unchanged from previous note.  ALLERGIES:  is allergic to aspirin; hydrocodone-acetaminophen; morphine; penicillins; betaine; dacarbazine; ketorolac tromethamine; brimonidine; diltiazem hcl; hydrocodone-acetaminophen; neurontin [gabapentin]; and sulfacetamide sodium.  MEDICATIONS:  Current Outpatient Medications  Medication Sig Dispense Refill  . acetaminophen (TYLENOL) 500 MG tablet Take 500 mg by mouth 2 (two) times daily as needed for moderate pain.    Marland Kitchen acyclovir (ZOVIRAX) 400 MG tablet TAKE 1 TABLET BY MOUTH EVERY DAY 90 tablet 2  . allopurinol (ZYLOPRIM) 100 MG tablet Take 0.5 tablets (50 mg total) by mouth daily. 30 tablet 0  . B Complex-C-Zn-Folic Acid (DIALYVITE 916-OMAY 15) 0.8 MG TABS Take 1 tablet by mouth every evening.     . cholecalciferol  (VITAMIN D3) 25 MCG (1000 UT) tablet Take 2,000 Units by mouth daily.    Marland Kitchen dexamethasone (DECADRON) 4 MG tablet Weekly until it is finished 30 tablet 0  . dorzolamide (TRUSOPT) 2 % ophthalmic solution Place 1 drop into the left eye every evening.   1  . ethyl chloride spray Apply 1 application topically as needed (prior to port being accessed).     . fluorometholone (FML) 0.1 % ophthalmic suspension Place 1 drop into the right eye 3 (three) times daily.     . isosorbide-hydrALAZINE (BIDIL) 20-37.5 MG tablet Take 1 tablet by mouth 2 (two) times a day. 60 tablet 0  . lactulose (CHRONULAC) 10 GM/15ML solution TAKE 15 MLS (10 G TOTAL) BY MOUTH 2 (TWO) TIMES DAILY. 473 mL 0  . LUMIGAN 0.01 % SOLN Place 1 drop into the left eye every evening.     Marland Kitchen LYRICA 50 MG capsule Take 50 mg by mouth at bedtime as needed (pain/neuropathy).     . ondansetron (ZOFRAN) 4 MG tablet Take 1 tablet (4 mg total) by mouth every 8 (eight) hours as needed for nausea. 30 tablet 1  . pantoprazole (PROTONIX) 40 MG tablet Take 1 tablet (40 mg total) by mouth daily as needed (acid reflux/indigestion.). 30 tablet 11  . polyethylene glycol (MIRALAX / GLYCOLAX) 17 g packet Take 17 g by mouth daily as needed (constipation).    . prochlorperazine (COMPAZINE) 5 MG tablet Take 1 tablet (5 mg total) by mouth every 6 (six) hours as needed for nausea or vomiting. 30 tablet 1  . senna-docusate (SENOKOT-S) 8.6-50 MG tablet Take 2 tablets by mouth 2 (two) times daily. (Patient taking differently: Take 2 tablets by mouth 2 (two) times daily as needed (constipation.). )  30 tablet 0  . sevelamer carbonate (RENVELA) 800 MG tablet Take 800-1,200 mg by mouth daily.     . sodium chloride (OCEAN) 0.65 % SOLN nasal spray Place 1 spray into both nostrils 4 (four) times daily as needed for congestion.      No current facility-administered medications for this visit.    PHYSICAL EXAMINATION: ECOG PERFORMANCE STATUS: 2 - Symptomatic, <50% confined to  bed  Vitals:   01/12/20 1127  BP: (!) 120/43  Pulse: 70  Resp: 18  Temp: 98.2 F (36.8 C)  SpO2: 100%   Filed Weights   01/12/20 1127  Weight: 129 lb 6.4 oz (58.7 kg)    GENERAL:alert, no distress and comfortable SKIN: skin color, texture, turgor are normal, no rashes or significant lesions EYES: normal, Conjunctiva are pink and non-injected, sclera clear OROPHARYNX:no exudate, no erythema and lips, buccal mucosa, and tongue normal  NECK: supple, thyroid normal size, non-tender, without nodularity LYMPH:  no palpable lymphadenopathy in the cervical, axillary or inguinal LUNGS: clear to auscultation and percussion with normal breathing effort HEART: regular rate & rhythm and no murmurs and no lower extremity edema ABDOMEN:abdomen soft, non-tender and normal bowel sounds Musculoskeletal:no cyanosis of digits and no clubbing  NEURO: alert & oriented x 3 with fluent speech, no focal motor/sensory deficits  LABORATORY DATA:  I have reviewed the data as listed    Component Value Date/Time   NA 139 01/12/2020 1104   K 3.9 01/12/2020 1104   CL 99 01/12/2020 1104   CO2 29 01/12/2020 1104   GLUCOSE 121 (H) 01/12/2020 1104   BUN 35 (H) 01/12/2020 1104   CREATININE 3.92 (HH) 01/12/2020 1104   CREATININE 2.71 (H) 10/20/2019 1128   CREATININE 1.03 (H) 05/11/2016 0931   CALCIUM 8.8 (L) 01/12/2020 1104   PROT 7.4 01/12/2020 1104   ALBUMIN 3.4 (L) 01/12/2020 1104   AST 21 01/12/2020 1104   AST 18 10/20/2019 1128   ALT 13 01/12/2020 1104   ALT 10 10/20/2019 1128   ALKPHOS 55 01/12/2020 1104   BILITOT 0.4 01/12/2020 1104   BILITOT 0.4 10/20/2019 1128   GFRNONAA 10 (L) 01/12/2020 1104   GFRNONAA 15 (L) 10/20/2019 1128   GFRAA 11 (L) 01/12/2020 1104   GFRAA 18 (L) 10/20/2019 1128    No results found for: SPEP, UPEP  Lab Results  Component Value Date   WBC 3.7 (L) 01/12/2020   NEUTROABS 2.0 01/12/2020   HGB 11.6 (L) 01/12/2020   HCT 35.9 (L) 01/12/2020   MCV 89.1 01/12/2020    PLT 148 (L) 01/12/2020      Chemistry      Component Value Date/Time   NA 139 01/12/2020 1104   K 3.9 01/12/2020 1104   CL 99 01/12/2020 1104   CO2 29 01/12/2020 1104   BUN 35 (H) 01/12/2020 1104   CREATININE 3.92 (HH) 01/12/2020 1104   CREATININE 2.71 (H) 10/20/2019 1128   CREATININE 1.03 (H) 05/11/2016 0931      Component Value Date/Time   CALCIUM 8.8 (L) 01/12/2020 1104   ALKPHOS 55 01/12/2020 1104   AST 21 01/12/2020 1104   AST 18 10/20/2019 1128   ALT 13 01/12/2020 1104   ALT 10 10/20/2019 1128   BILITOT 0.4 01/12/2020 1104   BILITOT 0.4 10/20/2019 1128

## 2020-01-12 NOTE — Assessment & Plan Note (Signed)
I reviewed recent myeloma panel with the patient She had excellent response to treatment and so far has minimum side effects Her M protein is stable and the light chains fluctuated widely We will proceed with maintenance treatment every 3 weeks She has no infusion reaction and so we will not have to keep her for observation after each dose of Faspro I will continue to monitor myeloma panel once a month I reminded her to take acyclovir along with vitamin D supplement She is to tapered off dexamethasone Due to poor renal function, I will not be prescribing Zometa

## 2020-01-12 NOTE — Telephone Encounter (Signed)
Reported creatinine of 3.92 to Dr. Alvy Bimler.

## 2020-01-13 LAB — KAPPA/LAMBDA LIGHT CHAINS
Kappa free light chain: 436.5 mg/L — ABNORMAL HIGH (ref 3.3–19.4)
Kappa, lambda light chain ratio: 41.18 — ABNORMAL HIGH (ref 0.26–1.65)
Lambda free light chains: 10.6 mg/L (ref 5.7–26.3)

## 2020-01-15 LAB — MULTIPLE MYELOMA PANEL, SERUM
Albumin SerPl Elph-Mcnc: 3.5 g/dL (ref 2.9–4.4)
Albumin/Glob SerPl: 1.1 (ref 0.7–1.7)
Alpha 1: 0.2 g/dL (ref 0.0–0.4)
Alpha2 Glob SerPl Elph-Mcnc: 0.6 g/dL (ref 0.4–1.0)
B-Globulin SerPl Elph-Mcnc: 2.3 g/dL — ABNORMAL HIGH (ref 0.7–1.3)
Gamma Glob SerPl Elph-Mcnc: 0.3 g/dL — ABNORMAL LOW (ref 0.4–1.8)
Globulin, Total: 3.4 g/dL (ref 2.2–3.9)
IgA: 17 mg/dL — ABNORMAL LOW (ref 64–422)
IgG (Immunoglobin G), Serum: 2082 mg/dL — ABNORMAL HIGH (ref 586–1602)
IgM (Immunoglobulin M), Srm: 13 mg/dL — ABNORMAL LOW (ref 26–217)
M Protein SerPl Elph-Mcnc: 1.4 g/dL — ABNORMAL HIGH
Total Protein ELP: 6.9 g/dL (ref 6.0–8.5)

## 2020-01-27 ENCOUNTER — Other Ambulatory Visit: Payer: Self-pay | Admitting: Hematology and Oncology

## 2020-01-27 NOTE — Progress Notes (Signed)

## 2020-02-02 ENCOUNTER — Encounter: Payer: Self-pay | Admitting: *Deleted

## 2020-02-02 ENCOUNTER — Inpatient Hospital Stay: Payer: Medicare HMO | Attending: Hematology and Oncology

## 2020-02-02 ENCOUNTER — Other Ambulatory Visit: Payer: Self-pay

## 2020-02-02 ENCOUNTER — Inpatient Hospital Stay: Payer: Medicare HMO

## 2020-02-02 ENCOUNTER — Inpatient Hospital Stay (HOSPITAL_BASED_OUTPATIENT_CLINIC_OR_DEPARTMENT_OTHER): Payer: Medicare HMO | Admitting: Hematology and Oncology

## 2020-02-02 ENCOUNTER — Encounter: Payer: Self-pay | Admitting: Hematology and Oncology

## 2020-02-02 ENCOUNTER — Other Ambulatory Visit: Payer: Self-pay | Admitting: Hematology and Oncology

## 2020-02-02 DIAGNOSIS — E538 Deficiency of other specified B group vitamins: Secondary | ICD-10-CM | POA: Diagnosis not present

## 2020-02-02 DIAGNOSIS — Z5111 Encounter for antineoplastic chemotherapy: Secondary | ICD-10-CM | POA: Insufficient documentation

## 2020-02-02 DIAGNOSIS — Z5112 Encounter for antineoplastic immunotherapy: Secondary | ICD-10-CM | POA: Diagnosis not present

## 2020-02-02 DIAGNOSIS — Z7189 Other specified counseling: Secondary | ICD-10-CM

## 2020-02-02 DIAGNOSIS — C9000 Multiple myeloma not having achieved remission: Secondary | ICD-10-CM | POA: Insufficient documentation

## 2020-02-02 DIAGNOSIS — Z79899 Other long term (current) drug therapy: Secondary | ICD-10-CM | POA: Insufficient documentation

## 2020-02-02 DIAGNOSIS — N186 End stage renal disease: Secondary | ICD-10-CM | POA: Insufficient documentation

## 2020-02-02 DIAGNOSIS — T451X5A Adverse effect of antineoplastic and immunosuppressive drugs, initial encounter: Secondary | ICD-10-CM

## 2020-02-02 DIAGNOSIS — G620 Drug-induced polyneuropathy: Secondary | ICD-10-CM

## 2020-02-02 DIAGNOSIS — M25552 Pain in left hip: Secondary | ICD-10-CM | POA: Diagnosis not present

## 2020-02-02 DIAGNOSIS — Z992 Dependence on renal dialysis: Secondary | ICD-10-CM

## 2020-02-02 LAB — COMPREHENSIVE METABOLIC PANEL
ALT: 13 U/L (ref 0–44)
AST: 20 U/L (ref 15–41)
Albumin: 3.5 g/dL (ref 3.5–5.0)
Alkaline Phosphatase: 58 U/L (ref 38–126)
Anion gap: 14 (ref 5–15)
BUN: 52 mg/dL — ABNORMAL HIGH (ref 8–23)
CO2: 30 mmol/L (ref 22–32)
Calcium: 8.9 mg/dL (ref 8.9–10.3)
Chloride: 92 mmol/L — ABNORMAL LOW (ref 98–111)
Creatinine, Ser: 4.9 mg/dL (ref 0.44–1.00)
GFR calc Af Amer: 9 mL/min — ABNORMAL LOW (ref >60)
GFR calc non Af Amer: 7 mL/min — ABNORMAL LOW (ref >60)
Glucose, Bld: 101 mg/dL — ABNORMAL HIGH (ref 70–99)
Potassium: 4.3 mmol/L (ref 3.5–5.1)
Sodium: 136 mmol/L (ref 135–145)
Total Bilirubin: 0.5 mg/dL (ref 0.3–1.2)
Total Protein: 7.9 g/dL (ref 6.5–8.1)

## 2020-02-02 LAB — CBC WITH DIFFERENTIAL/PLATELET
Abs Immature Granulocytes: 0.01 10*3/uL (ref 0.00–0.07)
Basophils Absolute: 0 10*3/uL (ref 0.0–0.1)
Basophils Relative: 1 %
Eosinophils Absolute: 0.1 10*3/uL (ref 0.0–0.5)
Eosinophils Relative: 1 %
HCT: 34.8 % — ABNORMAL LOW (ref 36.0–46.0)
Hemoglobin: 11.2 g/dL — ABNORMAL LOW (ref 12.0–15.0)
Immature Granulocytes: 0 %
Lymphocytes Relative: 33 %
Lymphs Abs: 1.2 10*3/uL (ref 0.7–4.0)
MCH: 28.6 pg (ref 26.0–34.0)
MCHC: 32.2 g/dL (ref 30.0–36.0)
MCV: 88.8 fL (ref 80.0–100.0)
Monocytes Absolute: 0.4 10*3/uL (ref 0.1–1.0)
Monocytes Relative: 11 %
Neutro Abs: 2.1 10*3/uL (ref 1.7–7.7)
Neutrophils Relative %: 54 %
Platelets: 173 10*3/uL (ref 150–400)
RBC: 3.92 MIL/uL (ref 3.87–5.11)
RDW: 14.6 % (ref 11.5–15.5)
WBC: 3.8 10*3/uL — ABNORMAL LOW (ref 4.0–10.5)
nRBC: 0 % (ref 0.0–0.2)

## 2020-02-02 MED ORDER — DIPHENHYDRAMINE HCL 25 MG PO CAPS
ORAL_CAPSULE | ORAL | Status: AC
  Filled 2020-02-02: qty 1

## 2020-02-02 MED ORDER — ACETAMINOPHEN 325 MG PO TABS
ORAL_TABLET | ORAL | Status: AC
  Filled 2020-02-02: qty 2

## 2020-02-02 MED ORDER — BORTEZOMIB CHEMO SQ INJECTION 3.5 MG (2.5MG/ML)
0.7800 mg/m2 | Freq: Once | INTRAMUSCULAR | Status: AC
  Administered 2020-02-02: 1.25 mg via SUBCUTANEOUS
  Filled 2020-02-02: qty 0.5

## 2020-02-02 MED ORDER — DARATUMUMAB-HYALURONIDASE-FIHJ 1800-30000 MG-UT/15ML ~~LOC~~ SOLN
1800.0000 mg | Freq: Once | SUBCUTANEOUS | Status: AC
  Administered 2020-02-02: 1800 mg via SUBCUTANEOUS
  Filled 2020-02-02: qty 15

## 2020-02-02 MED ORDER — ACETAMINOPHEN 325 MG PO TABS
650.0000 mg | ORAL_TABLET | Freq: Once | ORAL | Status: AC
  Administered 2020-02-02: 650 mg via ORAL

## 2020-02-02 MED ORDER — DEXAMETHASONE 4 MG PO TABS
8.0000 mg | ORAL_TABLET | Freq: Once | ORAL | Status: AC
  Administered 2020-02-02: 8 mg via ORAL

## 2020-02-02 MED ORDER — DEXAMETHASONE 4 MG PO TABS
ORAL_TABLET | ORAL | Status: AC
  Filled 2020-02-02: qty 2

## 2020-02-02 MED ORDER — PROCHLORPERAZINE MALEATE 10 MG PO TABS
ORAL_TABLET | ORAL | Status: AC
  Filled 2020-02-02: qty 1

## 2020-02-02 MED ORDER — PROCHLORPERAZINE MALEATE 10 MG PO TABS
10.0000 mg | ORAL_TABLET | Freq: Once | ORAL | Status: AC
  Administered 2020-02-02: 10 mg via ORAL

## 2020-02-02 MED ORDER — DIPHENHYDRAMINE HCL 25 MG PO CAPS
25.0000 mg | ORAL_CAPSULE | Freq: Once | ORAL | Status: AC
  Administered 2020-02-02: 25 mg via ORAL

## 2020-02-02 NOTE — Progress Notes (Signed)
Spoke w/ Dr. Alvy Bimler - dexamethasone dose will be decreased to 8mg  orally today and future doses due to age and diabetes. Doses may be adjusted in the future pending tolerability.  Demetrius Charity, PharmD, BCPS, [place] [facility] Phone: [phone] 02/02/2020

## 2020-02-02 NOTE — Progress Notes (Signed)
CRITICAL VALUE ALERT  Critical Value:  crt 4.9  Date & Time Notied:  02/02/20 at 1315  Provider Notified: Dr. Alvy Bimler  Orders Received/Actions taken: MD notified, no orders received at this time.

## 2020-02-02 NOTE — Assessment & Plan Note (Signed)
The cause of her neuropathy is multifactorial, likely secondary to diabetic neuropathy, vitamin B12 deficiency as well as Velcade We will observe closely for now

## 2020-02-02 NOTE — Progress Notes (Signed)
Received critical value creatinine of 4.9 and called Merleen Nicely, RN at Dr. Calton Dach desk to let her know.  Gardiner Rhyme, RN

## 2020-02-02 NOTE — Progress Notes (Signed)
[place] OFFICE PROGRESS NOTE  Patient Care Team: Lucianne Lei, MD as PCP - General (Family Medicine)  ASSESSMENT & PLAN:  Multiple myeloma not having achieved remission [facility]) I reviewed recent myeloma panel with the patient She had excellent response to treatment and so far has minimum side effects Her M protein is stable and the light chains fluctuated widely We will proceed with maintenance treatment every 3 weeks She has no infusion reaction and so we will not have to keep her for observation after each dose of Faspro I will continue to monitor myeloma panel once a month I reminded her to take acyclovir along with vitamin D supplement She is to tapered off dexamethasone Due to poor renal function, I will not be prescribing Zometa  ESRD (end stage renal disease) on dialysis [facility]) She will continue hemodialysis on Tuesdays, Thursdays and Saturdays as scheduled Neither daratumumab nor Velcade needs dose adjustment in the setting of renal failure  Peripheral neuropathy due to chemotherapy [facility] The cause of her neuropathy is multifactorial, likely secondary to diabetic neuropathy, vitamin B12 deficiency as well as Velcade We will observe closely for now  Left hip pain She has chronic left hip pain I recommend conservative approach There is no contraindication for her to get injection to her left hip joint if needed She has appointment to see orthopedics for further management The patient tolerated narcotic prescription poorly in the past with severe constipation   No orders of the defined types were placed in this encounter.   All questions were answered. The patient knows to call the clinic with any problems, questions or concerns. The total time spent in the appointment was 20 minutes encounter with patients including review of chart and various tests results, discussions about plan of care and coordination of care plan   Heath Lark, MD 02/02/2020 1:25  PM  INTERVAL HISTORY: Please see below for problem oriented charting. She returns for chemotherapy and follow-up She still have persistent neuropathy but not worse She is managing her bowel habits well with regular laxatives She has not got her hip pain taken care of because she recalled being referred to pain clinic in the past No recent falls Hemodialysis is working well No recent infection, fever or chills  SUMMARY OF ONCOLOGIC HISTORY: Oncology History  Multiple myeloma not having achieved remission ([place])  03/03/2019 Initial Diagnosis   Multiple myeloma not having achieved remission ([place])   03/03/2019 Imaging   1. Round and oval lucent/lytic lesions in the skull, bilateral humeri, distal left femur, and possibly also in the pelvis are compatible with multiple myeloma. No right lower extremity lytic lesion identified. No pathologic fracture. 2. Advanced degenerative changes in the spine with no vertebral myeloma evident by plain radiography. 3. Advanced degenerative changes in the right upper extremity. Bilateral knee arthroplasty. 4.  Aortic Atherosclerosis (ICD10-I70.0).   03/04/2019 -  Chemotherapy   The patient had bortezomib for chemotherapy treatment.    03/04/2019 Bone Marrow Biopsy   Bone Marrow, Aspirate,Biopsy, and Clot - HYPERCELLULAR BONE MARROW FOR AGE WITH PLASMACYTOSIS. - SEE COMMENT. PERIPHERAL BLOOD: - NORMOCYTIC-NORMOCHROMIC ANEMIA. Diagnosis Note The bone marrow is hypercellular for age with increased number of atypical plasma cells averaging 30% of all cells in the aspirate associated with interstitial infiltrates and numerous variably sized aggregates in the clot and biopsy sections. 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 Confirmatory in situ hybridization for kappa and lambda as well as CD138 will be performed and the results  reported in an addendum. The background shows trilineage hematopoiesis with mild non-specific changes primarily  involving the erythroid series. Correlation with cytogenetic and FISH studies is recommended.    06/20/2019 -  Chemotherapy   The patient had daratumumab-hyaluronidase-fihj (DARZALEX FASPRO) 1800-30000 MG-UT/15ML chemo SQ injection 1,800 mg, 1,800 mg, Subcutaneous,  Once, 9 of 11 cycles Administration: 1,800 mg (06/20/2019), 1,800 mg (06/27/2019), 1,800 mg (07/04/2019), 1,800 mg (07/11/2019), 1,800 mg (07/18/2019), 1,800 mg (07/25/2019), 1,800 mg (08/08/2019), 1,800 mg (08/15/2019), 1,800 mg (09/05/2019), 1,800 mg (12/01/2019), 1,800 mg (09/26/2019), 1,800 mg (10/20/2019), 1,800 mg (11/10/2019), 1,800 mg (12/22/2019), 1,800 mg (01/12/2020)  for chemotherapy treatment.      REVIEW OF SYSTEMS:   Constitutional: Denies fevers, chills or abnormal weight loss Eyes: Denies blurriness of vision Ears, nose, mouth, throat, and face: Denies mucositis or sore throat Respiratory: Denies cough, dyspnea or wheezes Cardiovascular: Denies palpitation, chest discomfort or lower extremity swelling Gastrointestinal:  Denies nausea, heartburn or change in bowel habits Skin: Denies abnormal skin rashes Lymphatics: Denies new lymphadenopathy or easy bruising Behavioral/Psych: Mood is stable, no new changes  All other systems were reviewed with the patient and are negative.  I have reviewed the past medical history, past surgical history, social history and family history with the patient and they are unchanged from previous note.  ALLERGIES:  is allergic to aspirin; hydrocodone-acetaminophen; morphine; penicillins; betaine; dacarbazine; ketorolac tromethamine; brimonidine; diltiazem hcl; hydrocodone-acetaminophen; neurontin [gabapentin]; and sulfacetamide sodium.  MEDICATIONS:  Current Outpatient Medications  Medication Sig Dispense Refill  . acetaminophen (TYLENOL) 500 MG tablet Take 500 mg by mouth 2 (two) times daily as needed for moderate pain.    Marland Kitchen acyclovir (ZOVIRAX) 400 MG tablet TAKE 1 TABLET BY MOUTH EVERY DAY 90  tablet 2  . allopurinol (ZYLOPRIM) 100 MG tablet Take 0.5 tablets (50 mg total) by mouth daily. 30 tablet 0  . B Complex-C-Zn-Folic Acid (DIALYVITE 341-PFXT 15) 0.8 MG TABS Take 1 tablet by mouth every evening.     . cholecalciferol (VITAMIN D3) 25 MCG (1000 UT) tablet Take 2,000 Units by mouth daily.    . dorzolamide (TRUSOPT) 2 % ophthalmic solution Place 1 drop into the left eye every evening.   1  . ethyl chloride spray Apply 1 application topically as needed (prior to port being accessed).     . fluorometholone (FML) 0.1 % ophthalmic suspension Place 1 drop into the right eye 3 (three) times daily.     . isosorbide-hydrALAZINE (BIDIL) 20-37.5 MG tablet Take 1 tablet by mouth 2 (two) times a day. 60 tablet 0  . lactulose (CHRONULAC) 10 GM/15ML solution TAKE 15 MLS (10 G TOTAL) BY MOUTH 2 (TWO) TIMES DAILY. 473 mL 0  . LUMIGAN 0.01 % SOLN Place 1 drop into the left eye every evening.     Marland Kitchen LYRICA 50 MG capsule Take 50 mg by mouth at bedtime as needed (pain/neuropathy).     . ondansetron (ZOFRAN) 4 MG tablet Take 1 tablet (4 mg total) by mouth every 8 (eight) hours as needed for nausea. 30 tablet 1  . pantoprazole (PROTONIX) 40 MG tablet Take 1 tablet (40 mg total) by mouth daily as needed (acid reflux/indigestion.). 30 tablet 11  . polyethylene glycol (MIRALAX / GLYCOLAX) 17 g packet Take 17 g by mouth daily as needed (constipation).    . prochlorperazine (COMPAZINE) 5 MG tablet Take 1 tablet (5 mg total) by mouth every 6 (six) hours as needed for nausea or vomiting. 30 tablet 1  . senna-docusate (SENOKOT-S) 8.6-50  MG tablet Take 2 tablets by mouth 2 (two) times daily. (Patient taking differently: Take 2 tablets by mouth 2 (two) times daily as needed (constipation.). ) 30 tablet 0  . sevelamer carbonate (RENVELA) 800 MG tablet Take 800-1,200 mg by mouth daily.     . sodium chloride (OCEAN) 0.65 % SOLN nasal spray Place 1 spray into both nostrils 4 (four) times daily as needed for congestion.       No current facility-administered medications for this visit.    PHYSICAL EXAMINATION: ECOG PERFORMANCE STATUS: 2 - Symptomatic, <50% confined to bed  Vitals:   02/02/20 1241  BP: (!) 100/46  Pulse: 71  Resp: 18  Temp: 97.8 F (36.6 C)  SpO2: 94%   Filed Weights   02/02/20 1241  Weight: 127 lb 6.4 oz (57.8 kg)    GENERAL:alert, no distress and comfortable NEURO: alert & oriented x 3 with fluent speech, no focal motor/sensory deficits  LABORATORY DATA:  I have reviewed the data as listed    Component Value Date/Time   NA 136 02/02/2020 1213   K 4.3 02/02/2020 1213   CL 92 (L) 02/02/2020 1213   CO2 30 02/02/2020 1213   GLUCOSE 101 (H) 02/02/2020 1213   BUN 52 (H) 02/02/2020 1213   CREATININE 4.90 (HH) 02/02/2020 1213   CREATININE 2.71 (H) 10/20/2019 1128   CREATININE 1.03 (H) 05/11/2016 0931   CALCIUM 8.9 02/02/2020 1213   PROT 7.9 02/02/2020 1213   ALBUMIN 3.5 02/02/2020 1213   AST 20 02/02/2020 1213   AST 18 10/20/2019 1128   ALT 13 02/02/2020 1213   ALT 10 10/20/2019 1128   ALKPHOS 58 02/02/2020 1213   BILITOT 0.5 02/02/2020 1213   BILITOT 0.4 10/20/2019 1128   GFRNONAA 7 (L) 02/02/2020 1213   GFRNONAA 15 (L) 10/20/2019 1128   GFRAA 9 (L) 02/02/2020 1213   GFRAA 18 (L) 10/20/2019 1128    No results found for: SPEP, UPEP  Lab Results  Component Value Date   WBC 3.8 (L) 02/02/2020   NEUTROABS 2.1 02/02/2020   HGB 11.2 (L) 02/02/2020   HCT 34.8 (L) 02/02/2020   MCV 88.8 02/02/2020   PLT 173 02/02/2020      Chemistry      Component Value Date/Time   NA 136 02/02/2020 1213   K 4.3 02/02/2020 1213   CL 92 (L) 02/02/2020 1213   CO2 30 02/02/2020 1213   BUN 52 (H) 02/02/2020 1213   CREATININE 4.90 (HH) 02/02/2020 1213   CREATININE 2.71 (H) 10/20/2019 1128   CREATININE 1.03 (H) 05/11/2016 0931      Component Value Date/Time   CALCIUM 8.9 02/02/2020 1213   ALKPHOS 58 02/02/2020 1213   AST 20 02/02/2020 1213   AST 18 10/20/2019 1128   ALT 13  02/02/2020 1213   ALT 10 10/20/2019 1128   BILITOT 0.5 02/02/2020 1213   BILITOT 0.4 10/20/2019 1128

## 2020-02-02 NOTE — Assessment & Plan Note (Signed)
She has chronic left hip pain I recommend conservative approach There is no contraindication for her to get injection to her left hip joint if needed She has appointment to see orthopedics for further management The patient tolerated narcotic prescription poorly in the past with severe constipation

## 2020-02-02 NOTE — Assessment & Plan Note (Signed)
I reviewed recent myeloma panel with the patient She had excellent response to treatment and so far has minimum side effects Her M protein is stable and the light chains fluctuated widely We will proceed with maintenance treatment every 3 weeks She has no infusion reaction and so we will not have to keep her for observation after each dose of Faspro I will continue to monitor myeloma panel once a month I reminded her to take acyclovir along with vitamin D supplement She is to tapered off dexamethasone Due to poor renal function, I will not be prescribing Zometa

## 2020-02-02 NOTE — Patient Instructions (Signed)
COVID-19 Vaccine Information can be found at: [email or web address] For questions related to vaccine distribution or appointments, please email [email or web address] or call [phone].   [facility] Discharge Instructions for Patients Receiving Chemotherapy  Today you received the following chemotherapy agents: Bortezomib (Velcade) and Daratumumab-hyaluronidase-fihj (Darxalex Faspro)  To help prevent nausea and vomiting after your treatment, we encourage you to take your nausea medication as directed by your provider.   If you develop nausea and vomiting that is not controlled by your nausea medication, call the clinic.   BELOW ARE SYMPTOMS THAT SHOULD BE REPORTED IMMEDIATELY:  *FEVER GREATER THAN 100.5 F  *CHILLS WITH OR WITHOUT FEVER  NAUSEA AND VOMITING THAT IS NOT CONTROLLED WITH YOUR NAUSEA MEDICATION  *UNUSUAL SHORTNESS OF BREATH  *UNUSUAL BRUISING OR BLEEDING  TENDERNESS IN MOUTH AND THROAT WITH OR WITHOUT PRESENCE OF ULCERS  *URINARY PROBLEMS  *BOWEL PROBLEMS  UNUSUAL RASH Items with * indicate a potential emergency and should be followed up as soon as possible.  Feel free to call the clinic should you have any questions or concerns. The clinic phone number is [phone].  Please show the [facility] at check-in to the Emergency Department and triage nurse.  Coronavirus (COVID-19) Are you at risk?  Are you at risk for the Coronavirus (COVID-19)?  To be considered HIGH RISK for Coronavirus (COVID-19), you have to meet the following criteria:  . Traveled to [place], [place], [place], [place] or [place]; or in the [place] to [place], [place], [place], or [place]; and have fever, cough, and shortness of breath within the last 2 weeks of travel OR . Been in close contact with a person diagnosed with COVID-19 within the last 2 weeks and have fever, cough, and shortness of  breath . IF YOU DO NOT MEET THESE CRITERIA, YOU ARE CONSIDERED LOW RISK FOR COVID-19.  What to do if you are HIGH RISK for COVID-19?  Marland Kitchen If you are having a medical emergency, call 911. . Seek medical care right away. Before you go to a doctor's office, urgent care or emergency department, call ahead and tell them about your recent travel, contact with someone diagnosed with COVID-19, and your symptoms. You should receive instructions from your physician's office regarding next steps of care.  . When you arrive at healthcare provider, tell the healthcare staff immediately you have returned from visiting [place], [place], [place], [place] or [place]; or traveled in the [place] to [place], [place], [place], or [place]; in the last two weeks or you have been in close contact with a person diagnosed with COVID-19 in the last 2 weeks.   . Tell the health care staff about your symptoms: fever, cough and shortness of breath. . After you have been seen by a medical provider, you will be either: o Tested for (COVID-19) and discharged home on quarantine except to seek medical care if symptoms worsen, and asked to  - Stay home and avoid contact with others until you get your results (4-5 days)  - Avoid travel on public transportation if possible (such as bus, train, or airplane) or o Sent to the Emergency Department by EMS for evaluation, COVID-19 testing, and possible admission depending on your condition and test results.  What to do if you are LOW RISK for COVID-19?  Reduce your risk of any infection by using the same precautions used for avoiding the common cold or flu:  Marland Kitchen Wash your hands often with soap and  warm water for at least 20 seconds.  If soap and water are not readily available, use an alcohol-based hand sanitizer with at least 60% alcohol.  . If coughing or sneezing, cover your mouth and nose by coughing or sneezing into the elbow areas of your shirt or coat, into a tissue or into  your sleeve (not your hands). . Avoid shaking hands with others and consider head nods or verbal greetings only. . Avoid touching your eyes, nose, or mouth with unwashed hands.  . Avoid close contact with people who are sick. . Avoid places or events with large numbers of people in one location, like concerts or sporting events. . Carefully consider travel plans you have or are making. . If you are planning any travel outside or inside the [place], visit the CDC's Travelers' Health webpage for the latest health notices. . If you have some symptoms but not all symptoms, continue to monitor at home and seek medical attention if your symptoms worsen. . If you are having a medical emergency, call 911.   [place] / e-Visit: [email or web address]         [facility]: [facility]: [phone]                   [facility]: [phone]

## 2020-02-02 NOTE — Assessment & Plan Note (Signed)
She will continue hemodialysis on Tuesdays, Thursdays and Saturdays as scheduled Neither daratumumab nor Velcade needs dose adjustment in the setting of renal failure

## 2020-02-03 ENCOUNTER — Telehealth: Payer: Self-pay | Admitting: Hematology and Oncology

## 2020-02-03 LAB — KAPPA/LAMBDA LIGHT CHAINS
Kappa free light chain: 800.1 mg/L — ABNORMAL HIGH (ref 3.3–19.4)
Kappa, lambda light chain ratio: 65.58 — ABNORMAL HIGH (ref 0.26–1.65)
Lambda free light chains: 12.2 mg/L (ref 5.7–26.3)

## 2020-02-03 NOTE — Telephone Encounter (Signed)
No 4/19 los/sch msg. No changes made to pt's schedule.

## 2020-02-06 ENCOUNTER — Ambulatory Visit (INDEPENDENT_AMBULATORY_CARE_PROVIDER_SITE_OTHER): Payer: Medicare HMO | Admitting: *Deleted

## 2020-02-06 ENCOUNTER — Telehealth: Payer: Self-pay | Admitting: *Deleted

## 2020-02-06 ENCOUNTER — Telehealth: Payer: Self-pay | Admitting: Hematology and Oncology

## 2020-02-06 DIAGNOSIS — I495 Sick sinus syndrome: Secondary | ICD-10-CM | POA: Diagnosis not present

## 2020-02-06 LAB — CUP PACEART REMOTE DEVICE CHECK
Battery Remaining Longevity: 87 mo
Battery Remaining Percentage: 95.5 %
Battery Voltage: 2.96 V
Brady Statistic AP VP Percent: 79 %
Brady Statistic AP VS Percent: 2.5 %
Brady Statistic AS VP Percent: 8.6 %
Brady Statistic AS VS Percent: 9.8 %
Brady Statistic RA Percent Paced: 81 %
Brady Statistic RV Percent Paced: 87 %
Date Time Interrogation Session: 20210423020016
Implantable Lead Implant Date: 20100422
Implantable Lead Implant Date: 20100422
Implantable Lead Location: 753859
Implantable Lead Location: 753860
Implantable Pulse Generator Implant Date: 20170801
Lead Channel Impedance Value: 390 Ohm
Lead Channel Impedance Value: 460 Ohm
Lead Channel Pacing Threshold Amplitude: 0.75 V
Lead Channel Pacing Threshold Amplitude: 1 V
Lead Channel Pacing Threshold Pulse Width: 0.5 ms
Lead Channel Pacing Threshold Pulse Width: 0.5 ms
Lead Channel Sensing Intrinsic Amplitude: 11 mV
Lead Channel Sensing Intrinsic Amplitude: 2.3 mV
Lead Channel Setting Pacing Amplitude: 2 V
Lead Channel Setting Pacing Amplitude: 2.5 V
Lead Channel Setting Pacing Pulse Width: 0.5 ms
Lead Channel Setting Sensing Sensitivity: 2 mV
Pulse Gen Model: 2272
Pulse Gen Serial Number: 7929552

## 2020-02-06 LAB — MULTIPLE MYELOMA PANEL, SERUM
Albumin SerPl Elph-Mcnc: 3.5 g/dL (ref 2.9–4.4)
Albumin/Glob SerPl: 1 (ref 0.7–1.7)
Alpha 1: 0.3 g/dL (ref 0.0–0.4)
Alpha2 Glob SerPl Elph-Mcnc: 0.7 g/dL (ref 0.4–1.0)
B-Globulin SerPl Elph-Mcnc: 2.5 g/dL — ABNORMAL HIGH (ref 0.7–1.3)
Gamma Glob SerPl Elph-Mcnc: 0.2 g/dL — ABNORMAL LOW (ref 0.4–1.8)
Globulin, Total: 3.7 g/dL (ref 2.2–3.9)
IgA: 18 mg/dL — ABNORMAL LOW (ref 64–422)
IgG (Immunoglobin G), Serum: 2460 mg/dL — ABNORMAL HIGH (ref 586–1602)
IgM (Immunoglobulin M), Srm: 12 mg/dL — ABNORMAL LOW (ref 26–217)
M Protein SerPl Elph-Mcnc: 2.4 g/dL — ABNORMAL HIGH
Total Protein ELP: 7.2 g/dL (ref 6.0–8.5)

## 2020-02-06 NOTE — Progress Notes (Signed)
PPM Remote  

## 2020-02-06 NOTE — Telephone Encounter (Signed)
Scheduled appt per 4/23 sch message - spoke with pt and son and they are aware of appt date and time

## 2020-02-06 NOTE — Telephone Encounter (Signed)
Per Dr.Gorsuch, called to see if pt would come for office visit today or next week. Pt preferred 4/30. Scheduling message was sent

## 2020-02-13 ENCOUNTER — Telehealth: Payer: Self-pay | Admitting: Pharmacist

## 2020-02-13 ENCOUNTER — Encounter: Payer: Self-pay | Admitting: Hematology and Oncology

## 2020-02-13 ENCOUNTER — Other Ambulatory Visit: Payer: Self-pay

## 2020-02-13 ENCOUNTER — Inpatient Hospital Stay (HOSPITAL_BASED_OUTPATIENT_CLINIC_OR_DEPARTMENT_OTHER): Payer: Medicare HMO | Admitting: Hematology and Oncology

## 2020-02-13 VITALS — BP 109/45 | HR 77 | Temp 98.2°F | Resp 18 | Ht 65.0 in | Wt 123.4 lb

## 2020-02-13 DIAGNOSIS — C9000 Multiple myeloma not having achieved remission: Secondary | ICD-10-CM

## 2020-02-13 DIAGNOSIS — G620 Drug-induced polyneuropathy: Secondary | ICD-10-CM | POA: Diagnosis not present

## 2020-02-13 DIAGNOSIS — Z5111 Encounter for antineoplastic chemotherapy: Secondary | ICD-10-CM | POA: Diagnosis not present

## 2020-02-13 DIAGNOSIS — T451X5A Adverse effect of antineoplastic and immunosuppressive drugs, initial encounter: Secondary | ICD-10-CM | POA: Diagnosis not present

## 2020-02-13 DIAGNOSIS — Z7189 Other specified counseling: Secondary | ICD-10-CM

## 2020-02-13 MED ORDER — LENALIDOMIDE 2.5 MG PO CAPS: ORAL_CAPSULE | ORAL | 0 refills | Status: DC

## 2020-02-13 NOTE — Progress Notes (Signed)
[facility] OFFICE PROGRESS NOTE  Patient Care Team: Lucianne Lei, MD as PCP - General (Family Medicine)  ASSESSMENT & PLAN:  Multiple myeloma not having achieved remission ([place]) Unfortunately, recent myeloma panel suggest disease progression She has grade 2 neuropathy I do not recommend increasing the frequency of Velcade I recommend we keep daratumumab We discussed other treatment options including adding Cytoxan which I believe will be quite toxic for her causing risk of worsening pancytopenia and nausea and reduced quality of life On the other hand, the addition of lenalidomide can also increase the risk of thrombosis, heart attack or stroke After a long discussion, she is in agreement to try very low-dose lenalidomide at 2.5 mg 3 times a week after hemodialysis to see if we can add additional benefit to her current treatment She will continue daratumumab along with Velcade every 3 weeks I will get insurance prior authorization for lenalidomide We discussed the risk, benefits, side effects of lenalidomide including pancytopenia, risk of infection, and risk of thrombosis and others and she is in agreement to proceed She is aware that she needs to resume taking aspirin therapy  Peripheral neuropathy due to chemotherapy [facility]) Her neuropathy is stable We will monitor closely She will continue on low-dose bortezomib  Goals of care, counseling/discussion We have a very long discussion about goals of care Unfortunately, with her age and multiple comorbidities, treatment options are limited After long discussion, she is in agreement to try additional lenalidomide She is aware that treatment goal is palliative in nature   No orders of the defined types were placed in this encounter.   All questions were answered. The patient knows to call the clinic with any problems, questions or concerns. The total time spent in the appointment was 30 minutes encounter with patients  including review of chart and various tests results, discussions about plan of care and coordination of care plan   Heath Lark, MD 02/13/2020 7:04 PM  INTERVAL HISTORY: Please see below for problem oriented charting. She returns for further discussion about plan of care Unfortunately, recent myeloma panel showed disease progression She is not symptomatic No new bone pain No recent infection, fever or chills She has persistent grade 2 neuropathy affecting her hands and feet No recent chest pain or shortness of breath  SUMMARY OF ONCOLOGIC HISTORY: Oncology History  Multiple myeloma not having achieved remission ([place])  03/03/2019 Initial Diagnosis   Multiple myeloma not having achieved remission ([place])   03/03/2019 Imaging   1. Round and oval lucent/lytic lesions in the skull, bilateral humeri, distal left femur, and possibly also in the pelvis are compatible with multiple myeloma. No right lower extremity lytic lesion identified. No pathologic fracture. 2. Advanced degenerative changes in the spine with no vertebral myeloma evident by plain radiography. 3. Advanced degenerative changes in the right upper extremity. Bilateral knee arthroplasty. 4.  Aortic Atherosclerosis (ICD10-I70.0).   03/04/2019 -  Chemotherapy   The patient had bortezomib for chemotherapy treatment.    03/04/2019 Bone Marrow Biopsy   Bone Marrow, Aspirate,Biopsy, and Clot - HYPERCELLULAR BONE MARROW FOR AGE WITH PLASMACYTOSIS. - SEE COMMENT. PERIPHERAL BLOOD: - NORMOCYTIC-NORMOCHROMIC ANEMIA. Diagnosis Note The bone marrow is hypercellular for age with increased number of atypical plasma cells averaging 30% of all cells in the aspirate associated with interstitial infiltrates and numerous variably sized aggregates in the clot and biopsy sections. 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 Confirmatory in situ hybridization for kappa and lambda as well as  CD138 will be performed and the results  reported in an addendum. The background shows trilineage hematopoiesis with mild non-specific changes primarily involving the erythroid series. Correlation with cytogenetic and FISH studies is recommended.    06/20/2019 -  Chemotherapy   The patient had daratumumab-hyaluronidase-fihj (DARZALEX FASPRO) 1800-30000 MG-UT/15ML chemo SQ injection 1,800 mg, 1,800 mg, Subcutaneous,  Once, 10 of 11 cycles Administration: 1,800 mg (06/20/2019), 1,800 mg (06/27/2019), 1,800 mg (07/04/2019), 1,800 mg (07/11/2019), 1,800 mg (07/18/2019), 1,800 mg (07/25/2019), 1,800 mg (08/08/2019), 1,800 mg (08/15/2019), 1,800 mg (09/05/2019), 1,800 mg (12/01/2019), 1,800 mg (02/02/2020), 1,800 mg (09/26/2019), 1,800 mg (10/20/2019), 1,800 mg (11/10/2019), 1,800 mg (12/22/2019), 1,800 mg (01/12/2020)  for chemotherapy treatment.      REVIEW OF SYSTEMS:   Constitutional: Denies fevers, chills or abnormal weight loss Eyes: Denies blurriness of vision Ears, nose, mouth, throat, and face: Denies mucositis or sore throat Respiratory: Denies cough, dyspnea or wheezes Cardiovascular: Denies palpitation, chest discomfort or lower extremity swelling Gastrointestinal:  Denies nausea, heartburn or change in bowel habits Skin: Denies abnormal skin rashes Lymphatics: Denies new lymphadenopathy or easy bruising Behavioral/Psych: Mood is stable, no new changes  All other systems were reviewed with the patient and are negative.  I have reviewed the past medical history, past surgical history, social history and family history with the patient and they are unchanged from previous note.  ALLERGIES:  is allergic to aspirin; hydrocodone-acetaminophen; morphine; penicillins; betaine; dacarbazine; ketorolac tromethamine; brimonidine; diltiazem hcl; hydrocodone-acetaminophen; neurontin [gabapentin]; and sulfacetamide sodium.  MEDICATIONS:  Current Outpatient Medications  Medication Sig Dispense Refill  . acetaminophen (TYLENOL) 500 MG tablet Take 500 mg  by mouth 2 (two) times daily as needed for moderate pain.    Marland Kitchen acyclovir (ZOVIRAX) 400 MG tablet TAKE 1 TABLET BY MOUTH EVERY DAY 90 tablet 2  . allopurinol (ZYLOPRIM) 100 MG tablet Take 0.5 tablets (50 mg total) by mouth daily. 30 tablet 0  . B Complex-C-Zn-Folic Acid (DIALYVITE 998-PJAS 15) 0.8 MG TABS Take 1 tablet by mouth every evening.     . cholecalciferol (VITAMIN D3) 25 MCG (1000 UT) tablet Take 2,000 Units by mouth daily.    . dorzolamide (TRUSOPT) 2 % ophthalmic solution Place 1 drop into the left eye every evening.   1  . ethyl chloride spray Apply 1 application topically as needed (prior to port being accessed).     . fluorometholone (FML) 0.1 % ophthalmic suspension Place 1 drop into the right eye 3 (three) times daily.     . isosorbide-hydrALAZINE (BIDIL) 20-37.5 MG tablet Take 1 tablet by mouth 2 (two) times a day. 60 tablet 0  . lactulose (CHRONULAC) 10 GM/15ML solution TAKE 15 MLS (10 G TOTAL) BY MOUTH 2 (TWO) TIMES DAILY. 473 mL 0  . lenalidomide (REVLIMID) 2.5 MG capsule Take 1 capsule three times a week after dialysis on Tuesday, Thursday and Saturdays 12 capsule 0  . LUMIGAN 0.01 % SOLN Place 1 drop into the left eye every evening.     Marland Kitchen LYRICA 50 MG capsule Take 50 mg by mouth at bedtime as needed (pain/neuropathy).     . ondansetron (ZOFRAN) 4 MG tablet Take 1 tablet (4 mg total) by mouth every 8 (eight) hours as needed for nausea. 30 tablet 1  . pantoprazole (PROTONIX) 40 MG tablet Take 1 tablet (40 mg total) by mouth daily as needed (acid reflux/indigestion.). 30 tablet 11  . polyethylene glycol (MIRALAX / GLYCOLAX) 17 g packet Take 17 g by mouth daily as needed (constipation).    Marland Kitchen  prochlorperazine (COMPAZINE) 5 MG tablet Take 1 tablet (5 mg total) by mouth every 6 (six) hours as needed for nausea or vomiting. 30 tablet 1  . senna-docusate (SENOKOT-S) 8.6-50 MG tablet Take 2 tablets by mouth 2 (two) times daily. (Patient taking differently: Take 2 tablets by mouth 2 (two)  times daily as needed (constipation.). ) 30 tablet 0  . sevelamer carbonate (RENVELA) 800 MG tablet Take 800-1,200 mg by mouth daily.     . sodium chloride (OCEAN) 0.65 % SOLN nasal spray Place 1 spray into both nostrils 4 (four) times daily as needed for congestion.      No current facility-administered medications for this visit.    PHYSICAL EXAMINATION: ECOG PERFORMANCE STATUS: 2 - Symptomatic, <50% confined to bed  Vitals:   02/13/20 1016  BP: (!) 109/45  Pulse: 77  Resp: 18  Temp: 98.2 F (36.8 C)  SpO2: 97%   Filed Weights   02/13/20 1016  Weight: 123 lb 6.4 oz (56 kg)    GENERAL:alert, no distress and comfortable NEURO: alert & oriented x 3 with fluent speech, no focal motor/sensory deficits  LABORATORY DATA:  I have reviewed the data as listed    Component Value Date/Time   NA 136 02/02/2020 1213   K 4.3 02/02/2020 1213   CL 92 (L) 02/02/2020 1213   CO2 30 02/02/2020 1213   GLUCOSE 101 (H) 02/02/2020 1213   BUN 52 (H) 02/02/2020 1213   CREATININE 4.90 (HH) 02/02/2020 1213   CREATININE 2.71 (H) 10/20/2019 1128   CREATININE 1.03 (H) 05/11/2016 0931   CALCIUM 8.9 02/02/2020 1213   PROT 7.9 02/02/2020 1213   ALBUMIN 3.5 02/02/2020 1213   AST 20 02/02/2020 1213   AST 18 10/20/2019 1128   ALT 13 02/02/2020 1213   ALT 10 10/20/2019 1128   ALKPHOS 58 02/02/2020 1213   BILITOT 0.5 02/02/2020 1213   BILITOT 0.4 10/20/2019 1128   GFRNONAA 7 (L) 02/02/2020 1213   GFRNONAA 15 (L) 10/20/2019 1128   GFRAA 9 (L) 02/02/2020 1213   GFRAA 18 (L) 10/20/2019 1128    No results found for: SPEP, UPEP  Lab Results  Component Value Date   WBC 3.8 (L) 02/02/2020   NEUTROABS 2.1 02/02/2020   HGB 11.2 (L) 02/02/2020   HCT 34.8 (L) 02/02/2020   MCV 88.8 02/02/2020   PLT 173 02/02/2020      Chemistry      Component Value Date/Time   NA 136 02/02/2020 1213   K 4.3 02/02/2020 1213   CL 92 (L) 02/02/2020 1213   CO2 30 02/02/2020 1213   BUN 52 (H) 02/02/2020 1213    CREATININE 4.90 (HH) 02/02/2020 1213   CREATININE 2.71 (H) 10/20/2019 1128   CREATININE 1.03 (H) 05/11/2016 0931      Component Value Date/Time   CALCIUM 8.9 02/02/2020 1213   ALKPHOS 58 02/02/2020 1213   AST 20 02/02/2020 1213   AST 18 10/20/2019 1128   ALT 13 02/02/2020 1213   ALT 10 10/20/2019 1128   BILITOT 0.5 02/02/2020 1213   BILITOT 0.4 10/20/2019 1128

## 2020-02-13 NOTE — Assessment & Plan Note (Signed)
We have a very long discussion about goals of care Unfortunately, with her age and multiple comorbidities, treatment options are limited After long discussion, she is in agreement to try additional lenalidomide She is aware that treatment goal is palliative in nature

## 2020-02-13 NOTE — Assessment & Plan Note (Signed)
Unfortunately, recent myeloma panel suggest disease progression She has grade 2 neuropathy I do not recommend increasing the frequency of Velcade I recommend we keep daratumumab We discussed other treatment options including adding Cytoxan which I believe will be quite toxic for her causing risk of worsening pancytopenia and nausea and reduced quality of life On the other hand, the addition of lenalidomide can also increase the risk of thrombosis, heart attack or stroke After a long discussion, she is in agreement to try very low-dose lenalidomide at 2.5 mg 3 times a week after hemodialysis to see if we can add additional benefit to her current treatment She will continue daratumumab along with Velcade every 3 weeks I will get insurance prior authorization for lenalidomide We discussed the risk, benefits, side effects of lenalidomide including pancytopenia, risk of infection, and risk of thrombosis and others and she is in agreement to proceed She is aware that she needs to resume taking aspirin therapy

## 2020-02-13 NOTE — Assessment & Plan Note (Signed)
Her neuropathy is stable We will monitor closely She will continue on low-dose bortezomib

## 2020-02-13 NOTE — Telephone Encounter (Signed)
Oral Oncology Pharmacist Encounter  Received new prescription for Revlimid (lenalidomide) for the treatment of multiple myeloma in conjunction with daratumumab and bortezomib, planned duration until disease control or unacceptable drug toxicity.  CMP from 02/02/20 assessed, SCr elevated. Patient on hemodialysis, dose adjusted and patient to take Revlimid following hemodialysis. Prescription dose and frequency assessed.   Current medication list in Epic reviewed, no DDIs with lenalidomide identified.  Prescription has been e-scribed to the [facility] for benefits analysis and approval.  Oral Oncology Clinic will continue to follow for insurance authorization, copayment issues, initial counseling and start date.  Darl Pikes, PharmD, BCPS, BCOP, CPP Hematology/Oncology Clinical Pharmacist [facility]/HP/AP Oral [facility] [phone]  02/13/2020 5:00 PM

## 2020-02-16 MED ORDER — LENALIDOMIDE 2.5 MG PO CAPS: ORAL_CAPSULE | ORAL | 0 refills | Status: DC

## 2020-02-17 NOTE — Progress Notes (Signed)
Pharmacist Chemotherapy Monitoring - Follow Up Assessment    I verify that I have reviewed each item in the below checklist:  . Regimen for the patient is scheduled for the appropriate day and plan matches scheduled date. Marland Kitchen Appropriate non-routine labs are ordered dependent on drug ordered. . If applicable, additional medications reviewed and ordered per protocol based on lifetime cumulative doses and/or treatment regimen.   Plan for follow-up and/or issues identified: No . I-vent associated with next due treatment: No . MD and/or nursing notified: No  Victory Dresden D 02/17/2020 8:47 AM

## 2020-02-23 ENCOUNTER — Other Ambulatory Visit: Payer: Self-pay

## 2020-02-23 ENCOUNTER — Inpatient Hospital Stay: Payer: Medicare HMO

## 2020-02-23 ENCOUNTER — Encounter: Payer: Self-pay | Admitting: Hematology and Oncology

## 2020-02-23 ENCOUNTER — Inpatient Hospital Stay: Payer: Medicare HMO | Attending: Hematology and Oncology

## 2020-02-23 ENCOUNTER — Inpatient Hospital Stay (HOSPITAL_BASED_OUTPATIENT_CLINIC_OR_DEPARTMENT_OTHER): Payer: Medicare HMO | Admitting: Hematology and Oncology

## 2020-02-23 DIAGNOSIS — Z79899 Other long term (current) drug therapy: Secondary | ICD-10-CM | POA: Insufficient documentation

## 2020-02-23 DIAGNOSIS — Z7189 Other specified counseling: Secondary | ICD-10-CM

## 2020-02-23 DIAGNOSIS — C9000 Multiple myeloma not having achieved remission: Secondary | ICD-10-CM | POA: Diagnosis not present

## 2020-02-23 DIAGNOSIS — Z992 Dependence on renal dialysis: Secondary | ICD-10-CM | POA: Insufficient documentation

## 2020-02-23 DIAGNOSIS — D61818 Other pancytopenia: Secondary | ICD-10-CM | POA: Insufficient documentation

## 2020-02-23 DIAGNOSIS — M25552 Pain in left hip: Secondary | ICD-10-CM | POA: Insufficient documentation

## 2020-02-23 DIAGNOSIS — Z5111 Encounter for antineoplastic chemotherapy: Secondary | ICD-10-CM | POA: Diagnosis not present

## 2020-02-23 DIAGNOSIS — E538 Deficiency of other specified B group vitamins: Secondary | ICD-10-CM | POA: Insufficient documentation

## 2020-02-23 DIAGNOSIS — R110 Nausea: Secondary | ICD-10-CM

## 2020-02-23 LAB — CBC WITH DIFFERENTIAL/PLATELET
Abs Immature Granulocytes: 0.01 10*3/uL (ref 0.00–0.07)
Basophils Absolute: 0 10*3/uL (ref 0.0–0.1)
Basophils Relative: 1 %
Eosinophils Absolute: 0.1 10*3/uL (ref 0.0–0.5)
Eosinophils Relative: 2 %
HCT: 28.9 % — ABNORMAL LOW (ref 36.0–46.0)
Hemoglobin: 9.5 g/dL — ABNORMAL LOW (ref 12.0–15.0)
Immature Granulocytes: 0 %
Lymphocytes Relative: 31 %
Lymphs Abs: 1.3 10*3/uL (ref 0.7–4.0)
MCH: 28.8 pg (ref 26.0–34.0)
MCHC: 32.9 g/dL (ref 30.0–36.0)
MCV: 87.6 fL (ref 80.0–100.0)
Monocytes Absolute: 0.4 10*3/uL (ref 0.1–1.0)
Monocytes Relative: 8 %
Neutro Abs: 2.4 10*3/uL (ref 1.7–7.7)
Neutrophils Relative %: 58 %
Platelets: 147 10*3/uL — ABNORMAL LOW (ref 150–400)
RBC: 3.3 MIL/uL — ABNORMAL LOW (ref 3.87–5.11)
RDW: 15.6 % — ABNORMAL HIGH (ref 11.5–15.5)
WBC: 4.2 10*3/uL (ref 4.0–10.5)
nRBC: 0 % (ref 0.0–0.2)

## 2020-02-23 LAB — COMPREHENSIVE METABOLIC PANEL
ALT: 9 U/L (ref 0–44)
AST: 18 U/L (ref 15–41)
Albumin: 3.3 g/dL — ABNORMAL LOW (ref 3.5–5.0)
Alkaline Phosphatase: 59 U/L (ref 38–126)
Anion gap: 12 (ref 5–15)
BUN: 44 mg/dL — ABNORMAL HIGH (ref 8–23)
CO2: 31 mmol/L (ref 22–32)
Calcium: 9 mg/dL (ref 8.9–10.3)
Chloride: 96 mmol/L — ABNORMAL LOW (ref 98–111)
Creatinine, Ser: 5.05 mg/dL (ref 0.44–1.00)
GFR calc Af Amer: 8 mL/min — ABNORMAL LOW (ref >60)
GFR calc non Af Amer: 7 mL/min — ABNORMAL LOW (ref >60)
Glucose, Bld: 109 mg/dL — ABNORMAL HIGH (ref 70–99)
Potassium: 4 mmol/L (ref 3.5–5.1)
Sodium: 139 mmol/L (ref 135–145)
Total Bilirubin: 0.5 mg/dL (ref 0.3–1.2)
Total Protein: 7.6 g/dL (ref 6.5–8.1)

## 2020-02-23 MED ORDER — DEXAMETHASONE 4 MG PO TABS
ORAL_TABLET | ORAL | Status: AC
  Filled 2020-02-23: qty 2

## 2020-02-23 MED ORDER — DEXAMETHASONE 4 MG PO TABS
8.0000 mg | ORAL_TABLET | Freq: Once | ORAL | Status: AC
  Administered 2020-02-23: 8 mg via ORAL

## 2020-02-23 MED ORDER — DIPHENHYDRAMINE HCL 25 MG PO CAPS
25.0000 mg | ORAL_CAPSULE | Freq: Once | ORAL | Status: AC
  Administered 2020-02-23: 25 mg via ORAL

## 2020-02-23 MED ORDER — CYANOCOBALAMIN 1000 MCG/ML IJ SOLN
1000.0000 ug | Freq: Once | INTRAMUSCULAR | Status: AC
  Administered 2020-02-23: 1000 ug via INTRAMUSCULAR

## 2020-02-23 MED ORDER — ACETAMINOPHEN 325 MG PO TABS
ORAL_TABLET | ORAL | Status: AC
  Filled 2020-02-23: qty 2

## 2020-02-23 MED ORDER — CYANOCOBALAMIN 1000 MCG/ML IJ SOLN
INTRAMUSCULAR | Status: AC
  Filled 2020-02-23: qty 1

## 2020-02-23 MED ORDER — PROCHLORPERAZINE MALEATE 10 MG PO TABS
ORAL_TABLET | ORAL | Status: AC
  Filled 2020-02-23: qty 1

## 2020-02-23 MED ORDER — PROCHLORPERAZINE MALEATE 10 MG PO TABS
10.0000 mg | ORAL_TABLET | Freq: Once | ORAL | Status: AC
  Administered 2020-02-23: 10 mg via ORAL

## 2020-02-23 MED ORDER — BORTEZOMIB CHEMO SQ INJECTION 3.5 MG (2.5MG/ML)
0.7800 mg/m2 | Freq: Once | INTRAMUSCULAR | Status: AC
  Administered 2020-02-23: 1.25 mg via SUBCUTANEOUS
  Filled 2020-02-23: qty 0.5

## 2020-02-23 MED ORDER — DIPHENHYDRAMINE HCL 25 MG PO CAPS
ORAL_CAPSULE | ORAL | Status: AC
  Filled 2020-02-23: qty 1

## 2020-02-23 MED ORDER — DARATUMUMAB-HYALURONIDASE-FIHJ 1800-30000 MG-UT/15ML ~~LOC~~ SOLN
1800.0000 mg | Freq: Once | SUBCUTANEOUS | Status: AC
  Administered 2020-02-23: 1800 mg via SUBCUTANEOUS
  Filled 2020-02-23: qty 15

## 2020-02-23 MED ORDER — ACETAMINOPHEN 325 MG PO TABS
650.0000 mg | ORAL_TABLET | Freq: Once | ORAL | Status: AC
  Administered 2020-02-23: 650 mg via ORAL

## 2020-02-23 NOTE — Progress Notes (Signed)
[place] OFFICE PROGRESS NOTE  Patient Care Team: Lucianne Lei, MD as PCP - General (Family Medicine)  ASSESSMENT & PLAN:  Multiple myeloma not having achieved remission [facility]) We have a long discussion in our previous visit We have agreed on adding low-dose lenalidomide to her current regimen to augment disease response For some reason, she has not received her dose of lenalidomide She is aware that she will take the lenalidomide after each dialysis episode, 3 times a week I will get my nursing staff to call to find out the status of insurance approval for lenalidomide For now, she will continue subcutaneous daratumumab and bortezomib every 3 weeks  Pancytopenia, acquired ([place]) Overall, she has stable pancytopenia She will be getting vitamin B12 injection with each visit She agreed with the plan of care  Left hip pain She has chronic left hip pain I recommend conservative approach There is no contraindication for her to get injection to her left hip joint if needed She has appointment to see orthopedics for further management The patient tolerated narcotic prescription poorly in the past with severe constipation   No orders of the defined types were placed in this encounter.   All questions were answered. The patient knows to call the clinic with any problems, questions or concerns. The total time spent in the appointment was 25 minutes encounter with patients including review of chart and various tests results, discussions about plan of care and coordination of care plan   Heath Lark, MD 02/23/2020 10:46 AM  INTERVAL HISTORY: Please see below for problem oriented charting. She returns for her treatment and follow-up She has not received her supply of lenalidomide She has some questions regarding side effects of therapy which I have addressed today From my last visit, I recommend she contact her orthopedic surgeon to see if a hip injection on the left would  be helpful to control her pain She has not made the appointment In the meantime, she denies recent nausea or constipation  SUMMARY OF ONCOLOGIC HISTORY: Oncology History  Multiple myeloma not having achieved remission ([place])  03/03/2019 Initial Diagnosis   Multiple myeloma not having achieved remission ([place])   03/03/2019 Imaging   1. Round and oval lucent/lytic lesions in the skull, bilateral humeri, distal left femur, and possibly also in the pelvis are compatible with multiple myeloma. No right lower extremity lytic lesion identified. No pathologic fracture. 2. Advanced degenerative changes in the spine with no vertebral myeloma evident by plain radiography. 3. Advanced degenerative changes in the right upper extremity. Bilateral knee arthroplasty. 4.  Aortic Atherosclerosis (ICD10-I70.0).   03/04/2019 -  Chemotherapy   The patient had bortezomib for chemotherapy treatment.    03/04/2019 Bone Marrow Biopsy   Bone Marrow, Aspirate,Biopsy, and Clot - HYPERCELLULAR BONE MARROW FOR AGE WITH PLASMACYTOSIS. - SEE COMMENT. PERIPHERAL BLOOD: - NORMOCYTIC-NORMOCHROMIC ANEMIA. Diagnosis Note The bone marrow is hypercellular for age with increased number of atypical plasma cells averaging 30% of all cells in the aspirate associated with interstitial infiltrates and numerous variably sized aggregates in the clot and biopsy sections. 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 Confirmatory in situ hybridization for kappa and lambda as well as CD138 will be performed and the results reported in an addendum. The background shows trilineage hematopoiesis with mild non-specific changes primarily involving the erythroid series. Correlation with cytogenetic and FISH studies is recommended.    06/20/2019 -  Chemotherapy   The patient had daratumumab-hyaluronidase-fihj (DARZALEX FASPRO) 1800-30000 MG-UT/15ML chemo  SQ injection 1,800 mg, 1,800 mg, Subcutaneous,  Once, 10 of 12  cycles Administration: 1,800 mg (06/20/2019), 1,800 mg (06/27/2019), 1,800 mg (07/04/2019), 1,800 mg (07/11/2019), 1,800 mg (07/18/2019), 1,800 mg (07/25/2019), 1,800 mg (08/08/2019), 1,800 mg (08/15/2019), 1,800 mg (09/05/2019), 1,800 mg (12/01/2019), 1,800 mg (02/02/2020), 1,800 mg (09/26/2019), 1,800 mg (10/20/2019), 1,800 mg (11/10/2019), 1,800 mg (12/22/2019), 1,800 mg (01/12/2020)  for chemotherapy treatment.      REVIEW OF SYSTEMS:   Constitutional: Denies fevers, chills or abnormal weight loss Eyes: Denies blurriness of vision Ears, nose, mouth, throat, and face: Denies mucositis or sore throat Respiratory: Denies cough, dyspnea or wheezes Cardiovascular: Denies palpitation, chest discomfort or lower extremity swelling Gastrointestinal:  Denies nausea, heartburn or change in bowel habits Skin: Denies abnormal skin rashes Lymphatics: Denies new lymphadenopathy or easy bruising Neurological:Denies numbness, tingling or new weaknesses Behavioral/Psych: Mood is stable, no new changes  All other systems were reviewed with the patient and are negative.  I have reviewed the past medical history, past surgical history, social history and family history with the patient and they are unchanged from previous note.  ALLERGIES:  is allergic to aspirin; hydrocodone-acetaminophen; morphine; penicillins; betaine; dacarbazine; ketorolac tromethamine; brimonidine; diltiazem hcl; hydrocodone-acetaminophen; neurontin [gabapentin]; and sulfacetamide sodium.  MEDICATIONS:  Current Outpatient Medications  Medication Sig Dispense Refill  . acetaminophen (TYLENOL) 500 MG tablet Take 500 mg by mouth 2 (two) times daily as needed for moderate pain.    Marland Kitchen acyclovir (ZOVIRAX) 400 MG tablet TAKE 1 TABLET BY MOUTH EVERY DAY 90 tablet 2  . allopurinol (ZYLOPRIM) 100 MG tablet Take 0.5 tablets (50 mg total) by mouth daily. 30 tablet 0  . B Complex-C-Zn-Folic Acid (DIALYVITE 416-SAYT 15) 0.8 MG TABS Take 1 tablet by mouth every  evening.     . cholecalciferol (VITAMIN D3) 25 MCG (1000 UT) tablet Take 2,000 Units by mouth daily.    . dorzolamide (TRUSOPT) 2 % ophthalmic solution Place 1 drop into the left eye every evening.   1  . ethyl chloride spray Apply 1 application topically as needed (prior to port being accessed).     . fluorometholone (FML) 0.1 % ophthalmic suspension Place 1 drop into the right eye 3 (three) times daily.     . isosorbide-hydrALAZINE (BIDIL) 20-37.5 MG tablet Take 1 tablet by mouth 2 (two) times a day. 60 tablet 0  . lactulose (CHRONULAC) 10 GM/15ML solution TAKE 15 MLS (10 G TOTAL) BY MOUTH 2 (TWO) TIMES DAILY. 473 mL 0  . lenalidomide (REVLIMID) 2.5 MG capsule Take 1 capsule three times a week after dialysis on Tuesday, Thursday and Saturdays 12 capsule 0  . LUMIGAN 0.01 % SOLN Place 1 drop into the left eye every evening.     Marland Kitchen LYRICA 50 MG capsule Take 50 mg by mouth at bedtime as needed (pain/neuropathy).     . ondansetron (ZOFRAN) 4 MG tablet Take 1 tablet (4 mg total) by mouth every 8 (eight) hours as needed for nausea. 30 tablet 1  . pantoprazole (PROTONIX) 40 MG tablet Take 1 tablet (40 mg total) by mouth daily as needed (acid reflux/indigestion.). 30 tablet 11  . polyethylene glycol (MIRALAX / GLYCOLAX) 17 g packet Take 17 g by mouth daily as needed (constipation).    . prochlorperazine (COMPAZINE) 5 MG tablet Take 1 tablet (5 mg total) by mouth every 6 (six) hours as needed for nausea or vomiting. 30 tablet 1  . senna-docusate (SENOKOT-S) 8.6-50 MG tablet Take 2 tablets by mouth 2 (two) times daily. (  Patient taking differently: Take 2 tablets by mouth 2 (two) times daily as needed (constipation.). ) 30 tablet 0  . sevelamer carbonate (RENVELA) 800 MG tablet Take 800-1,200 mg by mouth daily.     . sodium chloride (OCEAN) 0.65 % SOLN nasal spray Place 1 spray into both nostrils 4 (four) times daily as needed for congestion.      No current facility-administered medications for this visit.     PHYSICAL EXAMINATION: ECOG PERFORMANCE STATUS: 2 - Symptomatic, <50% confined to bed  Vitals:   02/23/20 1014  BP: (!) 108/43  Pulse: 71  Resp: 18  Temp: 98.7 F (37.1 C)  SpO2: 100%   Filed Weights   02/23/20 1014  Weight: 125 lb (56.7 kg)    GENERAL:alert, no distress and comfortable NEURO: alert & oriented x 3 with fluent speech, no focal motor/sensory deficits  LABORATORY DATA:  I have reviewed the data as listed    Component Value Date/Time   NA 139 02/23/2020 0947   K 4.0 02/23/2020 0947   CL 96 (L) 02/23/2020 0947   CO2 31 02/23/2020 0947   GLUCOSE 109 (H) 02/23/2020 0947   BUN 44 (H) 02/23/2020 0947   CREATININE 5.05 (HH) 02/23/2020 0947   CREATININE 2.71 (H) 10/20/2019 1128   CREATININE 1.03 (H) 05/11/2016 0931   CALCIUM 9.0 02/23/2020 0947   PROT 7.6 02/23/2020 0947   ALBUMIN 3.3 (L) 02/23/2020 0947   AST 18 02/23/2020 0947   AST 18 10/20/2019 1128   ALT 9 02/23/2020 0947   ALT 10 10/20/2019 1128   ALKPHOS 59 02/23/2020 0947   BILITOT 0.5 02/23/2020 0947   BILITOT 0.4 10/20/2019 1128   GFRNONAA 7 (L) 02/23/2020 0947   GFRNONAA 15 (L) 10/20/2019 1128   GFRAA 8 (L) 02/23/2020 0947   GFRAA 18 (L) 10/20/2019 1128    No results found for: SPEP, UPEP  Lab Results  Component Value Date   WBC 4.2 02/23/2020   NEUTROABS 2.4 02/23/2020   HGB 9.5 (L) 02/23/2020   HCT 28.9 (L) 02/23/2020   MCV 87.6 02/23/2020   PLT 147 (L) 02/23/2020      Chemistry      Component Value Date/Time   NA 139 02/23/2020 0947   K 4.0 02/23/2020 0947   CL 96 (L) 02/23/2020 0947   CO2 31 02/23/2020 0947   BUN 44 (H) 02/23/2020 0947   CREATININE 5.05 (HH) 02/23/2020 0947   CREATININE 2.71 (H) 10/20/2019 1128   CREATININE 1.03 (H) 05/11/2016 0931      Component Value Date/Time   CALCIUM 9.0 02/23/2020 0947   ALKPHOS 59 02/23/2020 0947   AST 18 02/23/2020 0947   AST 18 10/20/2019 1128   ALT 9 02/23/2020 0947   ALT 10 10/20/2019 1128   BILITOT 0.5 02/23/2020  0947   BILITOT 0.4 10/20/2019 1128

## 2020-02-23 NOTE — Patient Instructions (Signed)
[facility] Discharge Instructions for Patients Receiving Chemotherapy  Today you received the following chemotherapy agents: bortezomib and daratumumab.  To help prevent nausea and vomiting after your treatment, we encourage you to take your nausea medication as directed.   If you develop nausea and vomiting that is not controlled by your nausea medication, call the clinic.   BELOW ARE SYMPTOMS THAT SHOULD BE REPORTED IMMEDIATELY:  *FEVER GREATER THAN 100.5 F  *CHILLS WITH OR WITHOUT FEVER  NAUSEA AND VOMITING THAT IS NOT CONTROLLED WITH YOUR NAUSEA MEDICATION  *UNUSUAL SHORTNESS OF BREATH  *UNUSUAL BRUISING OR BLEEDING  TENDERNESS IN MOUTH AND THROAT WITH OR WITHOUT PRESENCE OF ULCERS  *URINARY PROBLEMS  *BOWEL PROBLEMS  UNUSUAL RASH Items with * indicate a potential emergency and should be followed up as soon as possible.  Feel free to call the clinic should you have any questions or concerns. The clinic phone number is [phone].  Please show the [facility] at check-in to the Emergency Department and triage nurse.

## 2020-02-23 NOTE — Progress Notes (Signed)
Reported critical creatinine of 5.05 to Dr. Alvy Bimler.

## 2020-02-23 NOTE — Assessment & Plan Note (Signed)
She has chronic left hip pain I recommend conservative approach There is no contraindication for her to get injection to her left hip joint if needed She has appointment to see orthopedics for further management The patient tolerated narcotic prescription poorly in the past with severe constipation

## 2020-02-23 NOTE — Assessment & Plan Note (Signed)
Overall, she has stable pancytopenia She will be getting vitamin B12 injection with each visit She agreed with the plan of care

## 2020-02-23 NOTE — Assessment & Plan Note (Signed)
We have a long discussion in our previous visit We have agreed on adding low-dose lenalidomide to her current regimen to augment disease response For some reason, she has not received her dose of lenalidomide She is aware that she will take the lenalidomide after each dialysis episode, 3 times a week I will get my nursing staff to call to find out the status of insurance approval for lenalidomide For now, she will continue subcutaneous daratumumab and bortezomib every 3 weeks

## 2020-02-24 ENCOUNTER — Telehealth: Payer: Self-pay | Admitting: Hematology and Oncology

## 2020-02-24 ENCOUNTER — Other Ambulatory Visit: Payer: Self-pay | Admitting: *Deleted

## 2020-02-24 ENCOUNTER — Telehealth: Payer: Self-pay

## 2020-02-24 DIAGNOSIS — Z992 Dependence on renal dialysis: Secondary | ICD-10-CM

## 2020-02-24 DIAGNOSIS — N186 End stage renal disease: Secondary | ICD-10-CM

## 2020-02-24 LAB — KAPPA/LAMBDA LIGHT CHAINS
Kappa free light chain: 1126.1 mg/L — ABNORMAL HIGH (ref 3.3–19.4)
Kappa, lambda light chain ratio: 112.61 — ABNORMAL HIGH (ref 0.26–1.65)
Lambda free light chains: 10 mg/L (ref 5.7–26.3)

## 2020-02-24 NOTE — Telephone Encounter (Signed)
Spoke with pt's son jacque. Pt aware of appts on 6/7. Scheduled per 5/10 sch msg.

## 2020-02-24 NOTE — Progress Notes (Signed)
HISTORY AND PHYSICAL     CC:  dialysis access Requesting Provider:  Lucianne Lei, MD  HPI: This is a 86 y.o. female here for evaluation of her hemodialysis access.  She underwent a right RC AVF and superficialization of her fistula on 03/11/2019 by Dr. Scot Dock.  She has a left sided PPM.   She comes in today for evaluation of her fistula.  She states that her fistula worked pretty good yesterday, but sometimes, it doesn't run well and the machine shuts down.  She states that she had some bleeding from the stick site in the forearm area this morning, but his has stopped this afternoon.  She denies any pain in her right hand.  She states she does have some numbness in both hands.   The pt is right hand dominant.    Pt is on dialysis.   Days of dialysis if applicable:  T/T/S    HD center if applicable:  [place] location.   The pt is not on a statin for cholesterol management.  The pt is not on a daily aspirin.  Other AC:  none The pt is not on meds for hypertension.  The pt is diabetic.   Tobacco hx:  never  Past Medical History:  Diagnosis Date  . Anemia   . Benign paroxysmal positional vertigo 04/06/2010  . Benign positional vertigo   . 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 CAROTID BRUIT 02/24/2008  . DIABETES MELLITUS, TYPE II 02/24/2008  . ESRD (end stage renal disease) on dialysis ([place])    tues thurs sat [facility] kidney center sees [place] [facility]  . Glaucoma   . HYPERTENSION 02/24/2008  . Multiple myeloma ([place])   . Obstructive sleep apnea   . Pancytopenia ([place])   . Sick sinus syndrome [facility])     Past Surgical History:  Procedure Laterality Date  . ABDOMINAL HYSTERECTOMY     1980's  . AV FISTULA PLACEMENT Right 03/11/2019   Procedure: CREATION RIGHT ARM RADIOCEPHALIC ARTERIOVENOUS FISTULA;  Surgeon: Angelia Mould, MD;  Location: [facility];  Service: Vascular;  Laterality: Right;  . BACK SURGERY     x 3  . BIOPSY  06/24/2019   Procedure: BIOPSY;  Surgeon: Ronald Lobo, MD;  Location: [facility];  Service: Endoscopy;;  . EP IMPLANTABLE DEVICE N/A 05/16/2016   Procedure: PPM Generator Changeout;  Surgeon: Thompson Grayer, MD;  Location: [facility] CV LAB;  Service: Cardiovascular;  Laterality: N/A;  . ESOPHAGOGASTRODUODENOSCOPY (EGD) WITH PROPOFOL N/A 06/24/2019   Procedure: ESOPHAGOGASTRODUODENOSCOPY (EGD) WITH PROPOFOL;  Surgeon: Ronald Lobo, MD;  Location: [facility];  Service: Endoscopy;  Laterality: N/A;  . FISTULA SUPERFICIALIZATION Right 03/11/2019   Procedure: FISTULA SUPERFICIALIZATION;  Surgeon: Angelia Mould, MD;  Location: [facility];  Service: Vascular;  Laterality: Right;  . IR FLUORO GUIDE CV LINE RIGHT  03/07/2019  . IR US GUIDE VASC ACCESS RIGHT  03/07/2019  . KNEE SURGERY Right    2003  . KNEE SURGERY Left    2011  . PACEMAKER INSERTION      Allergies  Allergen Reactions  . Aspirin Other (See Comments)    REACTION: STOMACH ISSUES WITH DOSE HIGHER THAN 81 MG  Other reaction(s): GI Upset (intolerance), Other (See Comments) REACTION: STOMACH ISSUES WITH DOSE HIGHER THAN 81 MG  REACTION: STOMACH ISSUES WITH DOSE HIGHER THAN 81 MG   . Hydrocodone-Acetaminophen Nausea And Vomiting  . Morphine Nausea And Vomiting  . Penicillins Rash    Patient  took injection and tablets, had a reaction. She has taken amoxicillin with no reaction Has patient had a PCN reaction causing immediate rash, facial/tongue/throat swelling, SOB or lightheadedness with hypotension: Yes Has patient had a PCN reaction causing severe rash involving mucus membranes or skin necrosis: Yes Has patient had a PCN reaction that required hospitalization No Has patient had a PCN reaction occurring within the last 10 years: No If all of the above answers are "NO", then m Patient took injection and tablets, had a reaction. She has taken amoxicillin with no reaction Patient took injection and tablets, had a reaction. She has taken amoxicillin with no reaction Has patient  had a PCN reaction causing immediate rash, facial/tongue/throat swelling, SOB or lightheadedness with hypotension: Yes Has patient had a PCN reaction causing severe rash involving mucus membranes or skin necrosis: Yes Has patient had a PCN reaction that required hospitalization No Has patient had a PCN reaction occurring within the last 10 years: No If all of the above answers are "NO", then m  . Betaine Nausea And Vomiting  . Dacarbazine Nausea And Vomiting  . Ketorolac Tromethamine Itching  . Brimonidine Itching  . Diltiazem Hcl Rash  . Hydrocodone-Acetaminophen Nausea And Vomiting  . Neurontin [Gabapentin] Nausea And Vomiting  . Sulfacetamide Sodium Rash    Current Outpatient Medications  Medication Sig Dispense Refill  . acetaminophen (TYLENOL) 500 MG tablet Take 500 mg by mouth 2 (two) times daily as needed for moderate pain.    Marland Kitchen acyclovir (ZOVIRAX) 400 MG tablet TAKE 1 TABLET BY MOUTH EVERY DAY 90 tablet 2  . allopurinol (ZYLOPRIM) 100 MG tablet Take 0.5 tablets (50 mg total) by mouth daily. 30 tablet 0  . B Complex-C-Zn-Folic Acid (DIALYVITE 496-PRFF 15) 0.8 MG TABS Take 1 tablet by mouth every evening.     . cholecalciferol (VITAMIN D3) 25 MCG (1000 UT) tablet Take 2,000 Units by mouth daily.    . dorzolamide (TRUSOPT) 2 % ophthalmic solution Place 1 drop into the left eye every evening.   1  . ethyl chloride spray Apply 1 application topically as needed (prior to port being accessed).     . fluorometholone (FML) 0.1 % ophthalmic suspension Place 1 drop into the right eye 3 (three) times daily.     . isosorbide-hydrALAZINE (BIDIL) 20-37.5 MG tablet Take 1 tablet by mouth 2 (two) times a day. 60 tablet 0  . lactulose (CHRONULAC) 10 GM/15ML solution TAKE 15 MLS (10 G TOTAL) BY MOUTH 2 (TWO) TIMES DAILY. 473 mL 0  . lenalidomide (REVLIMID) 2.5 MG capsule Take 1 capsule three times a week after dialysis on Tuesday, Thursday and Saturdays 12 capsule 0  . LUMIGAN 0.01 % SOLN Place 1  drop into the left eye every evening.     Marland Kitchen LYRICA 50 MG capsule Take 50 mg by mouth at bedtime as needed (pain/neuropathy).     . ondansetron (ZOFRAN) 4 MG tablet Take 1 tablet (4 mg total) by mouth every 8 (eight) hours as needed for nausea. 30 tablet 1  . pantoprazole (PROTONIX) 40 MG tablet Take 1 tablet (40 mg total) by mouth daily as needed (acid reflux/indigestion.). 30 tablet 11  . polyethylene glycol (MIRALAX / GLYCOLAX) 17 g packet Take 17 g by mouth daily as needed (constipation).    . prochlorperazine (COMPAZINE) 5 MG tablet Take 1 tablet (5 mg total) by mouth every 6 (six) hours as needed for nausea or vomiting. 30 tablet 1  . senna-docusate (SENOKOT-S) 8.6-50 MG tablet  Take 2 tablets by mouth 2 (two) times daily. (Patient taking differently: Take 2 tablets by mouth 2 (two) times daily as needed (constipation.). ) 30 tablet 0  . sevelamer carbonate (RENVELA) 800 MG tablet Take 800-1,200 mg by mouth daily.     . sodium chloride (OCEAN) 0.65 % SOLN nasal spray Place 1 spray into both nostrils 4 (four) times daily as needed for congestion.      No current facility-administered medications for this visit.    Family History  Problem Relation Age of Onset  . Congestive Heart Failure Mother   . Tuberculosis Mother   . Multiple myeloma Mother   . Bone cancer Father   . Arthritis Father   . Breast cancer Sister   . Colon cancer Sister   . Hypertension Sister   . Dementia Sister   . Alcohol abuse Son     Social History   Socioeconomic History  . Marital status: Married    Spouse name: Not on file  . Number of children: 5  . Years of education: Not on file  . Highest education level: Not on file  Occupational History  . Occupation: retired  Tobacco Use  . Smoking status: Never Smoker  . Smokeless tobacco: Never Used  Substance and Sexual Activity  . Alcohol use: No  . Drug use: No  . Sexual activity: Not on file  Other Topics Concern  . Not on file  Social History  Narrative   Retired Marine scientist   Social Determinants of Radio broadcast assistant Strain:   . Difficulty of Paying Living Expenses:   Food Insecurity:   . Worried About Charity fundraiser in the Last Year:   . Arboriculturist in the Last Year:   Transportation Needs:   . Film/video editor (Medical):   Marland Kitchen Lack of Transportation (Non-Medical):   Physical Activity:   . Days of Exercise per Week:   . Minutes of Exercise per Session:   Stress:   . Feeling of Stress :   Social Connections:   . Frequency of Communication with Friends and Family:   . Frequency of Social Gatherings with Friends and Family:   . Attends Religious Services:   . Active Member of Clubs or Organizations:   . Attends Archivist Meetings:   Marland Kitchen Marital Status:   Intimate Partner Violence:   . Fear of Current or Ex-Partner:   . Emotionally Abused:   Marland Kitchen Physically Abused:   . Sexually Abused:      ROS: [x]  Positive   [ ]  Negative   [ ]  All sytems reviewed and are negative  Cardiac: [x]  PPM (left chest) [x]  shortness of breath with activity  Vascular: []  pain in legs while walking []  pain in feet when lying flat []  hx of DVT []  swelling in legs  Pulmonary: []  asthma []  wheezing  Neurologic: []  weakness in []  arms []  legs []  numbness in []  arms []  legs [] difficulty speaking or slurred speech []  temporary loss of vision in one eye []  dizziness  Hematologic: [x]  multiple myeloma  GI []  GERD  GU: [x]  CKD/renal failure  [x]  HD---[]  M/W/F [x]  T/T/S []  burning with urination []  blood in urine  Psychiatric: []  hx of major depression  Integumentary: []  rashes []  ulcers  Constitutional: []  fever []  chills  PHYSICAL EXAMINATION:  Today's Vitals   02/27/20 1146  BP: (!) 100/45  Pulse: 66  Resp: 16  Temp: 97.7 F (36.5 C)  TempSrc: Temporal  SpO2: 96%  Weight: 120 lb (54.4 kg)  Height: 5' 5"  (1.651 m)   Body mass index is 19.97 kg/m.    General:  WDWN female in  NAD Gait: Not observed HENT: WNL Pulmonary: normal non-labored breathing , without Rales, rhonchi,  wheezing Cardiac: regular, without  Murmurs Abdomen: soft, NT, no masses Skin: without rashes, without ulcers  Vascular Exam/Pulses:   Right Left  Radial 2+ (normal) 2+ (normal)  Ulnar Unable to palpate  Unable to palpate    Extremities:  without ischemic changes, without Gangrene, without cellulitis; without open wounds; there is a stronger thrill/bruit in the distal portion of the fistula in the forearm, but weakens proximally.   Musculoskeletal: no muscle wasting or atrophy  Neurologic: A&O X 3; Moving all extremities equally;  Speech is fluent/normal  Non-Invasive Vascular Imaging:   Dialysis duplex on 02-27-2020: +------------+----------+-------------+----------+------------------+  OUTFLOW VEINPSV (cm/s)Diameter (cm)Depth (cm)   Describe     +------------+----------+-------------+----------+------------------+  Prox UA     17    0.62     0.27             +------------+----------+-------------+----------+------------------+  Mid UA     24    0.61     0.21             +------------+----------+-------------+----------+------------------+  Dist UA     23    0.61     0.20   competing branch   +------------+----------+-------------+----------+------------------+  AC Fossa    30    0.91     0.15  change in Diameter  +------------+----------+-------------+----------+------------------+  Prox Forearm  517    0.21     0.56     stenotic     +------------+----------+-------------+----------+------------------+     Summary:  Patent arteriovenous fistula.  Arteriovenous fistula-Velocities less than 100cm/s noted in the  antecubital  fossa through the proximal upper arm.  Arteriovenous fistula-Stenosis noted in the proximal forearm.   Carotid duplex 11/30/2015 1-39%  bilateral carotid stenosis   ASSESSMENT/PLAN: 86 y.o. female with ESRD here for evaluation of her hemodialysis access and underwent right RC AVF and superficialization of her fistula on 03/11/2019 by Dr. Scot Dock.  -she has a good thrill in the distal portion of the fistula but it weakens more proximally.  Her velocities are decreased and stenotic in the forearm.  Discussed this with pt and her son who is present.  -will schedule pt for fistulogram to evaluate fistula on a M/W/F -pt is on dialysis T/T/S at [place] location -pt is is right hand dominant (she has PPM on the left) -pt is not on anticoagulation   Leontine Locket, [facility] [phone]  Clinic MD:   Donzetta Matters

## 2020-02-24 NOTE — Telephone Encounter (Signed)
Oral Oncology Patient Advocate Encounter  Was successful in securing patient a $11000 grant from [facility] to provide copayment coverage for Revlimid.  This will keep the out of pocket expense at $0.     [employer] ID: [id number]  I have spoken with the patient.   The billing information is as follows and has been shared with [employer].    RxBin: [id number] PCN: [id number] Member ID: [id number] Group ID: [id number] Dates of Eligibility: 01/24/20 through 01/22/21  Fund:  [name] Patient [name] Phone [phone] Fax [phone] 02/24/2020 8:33 AM

## 2020-02-25 LAB — MULTIPLE MYELOMA PANEL, SERUM
Albumin SerPl Elph-Mcnc: 3.4 g/dL (ref 2.9–4.4)
Albumin/Glob SerPl: 1 (ref 0.7–1.7)
Alpha 1: 0.2 g/dL (ref 0.0–0.4)
Alpha2 Glob SerPl Elph-Mcnc: 0.6 g/dL (ref 0.4–1.0)
B-Globulin SerPl Elph-Mcnc: 2.5 g/dL — ABNORMAL HIGH (ref 0.7–1.3)
Gamma Glob SerPl Elph-Mcnc: 0.2 g/dL — ABNORMAL LOW (ref 0.4–1.8)
Globulin, Total: 3.6 g/dL (ref 2.2–3.9)
IgA: 16 mg/dL — ABNORMAL LOW (ref 64–422)
IgG (Immunoglobin G), Serum: 2135 mg/dL — ABNORMAL HIGH (ref 586–1602)
IgM (Immunoglobulin M), Srm: 10 mg/dL — ABNORMAL LOW (ref 26–217)
M Protein SerPl Elph-Mcnc: 1.8 g/dL — ABNORMAL HIGH
Total Protein ELP: 7 g/dL (ref 6.0–8.5)

## 2020-02-27 ENCOUNTER — Ambulatory Visit: Payer: Medicare HMO | Admitting: Physician Assistant

## 2020-02-27 ENCOUNTER — Other Ambulatory Visit: Payer: Self-pay

## 2020-02-27 ENCOUNTER — Ambulatory Visit (HOSPITAL_COMMUNITY)
Admission: RE | Admit: 2020-02-27 | Discharge: 2020-02-27 | Disposition: A | Discharge status: Home / Self Care | Payer: Medicare HMO | Source: Ambulatory Visit | Attending: Vascular Surgery | Admitting: Vascular Surgery

## 2020-02-27 VITALS — BP 100/45 | HR 66 | Temp 97.7°F | Resp 16 | Ht 65.0 in | Wt 120.0 lb

## 2020-02-27 DIAGNOSIS — N186 End stage renal disease: Secondary | ICD-10-CM

## 2020-02-27 DIAGNOSIS — Z992 Dependence on renal dialysis: Secondary | ICD-10-CM

## 2020-03-05 ENCOUNTER — Other Ambulatory Visit (HOSPITAL_COMMUNITY)
Admission: RE | Admit: 2020-03-05 | Discharge: 2020-03-05 | Disposition: A | Discharge status: Home / Self Care | Payer: Medicare HMO | Source: Ambulatory Visit | Attending: Vascular Surgery | Admitting: Vascular Surgery

## 2020-03-05 DIAGNOSIS — Z01812 Encounter for preprocedural laboratory examination: Secondary | ICD-10-CM | POA: Diagnosis present

## 2020-03-05 DIAGNOSIS — Z20822 Contact with and (suspected) exposure to covid-19: Secondary | ICD-10-CM | POA: Insufficient documentation

## 2020-03-06 LAB — SARS CORONAVIRUS 2 (TAT 6-24 HRS): SARS Coronavirus 2: NEGATIVE

## 2020-03-08 ENCOUNTER — Encounter (HOSPITAL_COMMUNITY)
Admission: RE | Disposition: A | Discharge status: Home / Self Care | Payer: Self-pay | Source: Home / Self Care | Attending: Vascular Surgery

## 2020-03-08 ENCOUNTER — Other Ambulatory Visit: Payer: Self-pay

## 2020-03-08 ENCOUNTER — Ambulatory Visit (HOSPITAL_COMMUNITY)
Admission: RE | Admit: 2020-03-08 | Discharge: 2020-03-08 | Disposition: A | Discharge status: Home / Self Care | Payer: Medicare HMO | Attending: Vascular Surgery | Admitting: Vascular Surgery

## 2020-03-08 DIAGNOSIS — Y832 Surgical operation with anastomosis, bypass or graft as the cause of abnormal reaction of the patient, or of later complication, without mention of misadventure at the time of the procedure: Secondary | ICD-10-CM | POA: Diagnosis not present

## 2020-03-08 DIAGNOSIS — T82898A Other specified complication of vascular prosthetic devices, implants and grafts, initial encounter: Secondary | ICD-10-CM

## 2020-03-08 DIAGNOSIS — T829XXA Unspecified complication of cardiac and vascular prosthetic device, implant and graft, initial encounter: Secondary | ICD-10-CM | POA: Diagnosis present

## 2020-03-08 DIAGNOSIS — Z992 Dependence on renal dialysis: Secondary | ICD-10-CM

## 2020-03-08 DIAGNOSIS — N186 End stage renal disease: Secondary | ICD-10-CM | POA: Insufficient documentation

## 2020-03-08 HISTORY — PX: A/V FISTULAGRAM: CATH118298

## 2020-03-08 LAB — POCT I-STAT, CHEM 8
BUN: 58 mg/dL — ABNORMAL HIGH (ref 8–23)
Calcium, Ion: 0.97 mmol/L — ABNORMAL LOW (ref 1.15–1.40)
Chloride: 95 mmol/L — ABNORMAL LOW (ref 98–111)
Creatinine, Ser: 6.7 mg/dL — ABNORMAL HIGH (ref 0.44–1.00)
Glucose, Bld: 82 mg/dL (ref 70–99)
HCT: 30 % — ABNORMAL LOW (ref 36.0–46.0)
Hemoglobin: 10.2 g/dL — ABNORMAL LOW (ref 12.0–15.0)
Potassium: 4.4 mmol/L (ref 3.5–5.1)
Sodium: 133 mmol/L — ABNORMAL LOW (ref 135–145)
TCO2: 29 mmol/L (ref 22–32)

## 2020-03-08 SURGERY — A/V FISTULAGRAM
Anesthesia: LOCAL | Laterality: Right

## 2020-03-08 MED ORDER — HEPARIN (PORCINE) IN NACL 1000-0.9 UT/500ML-% IV SOLN
INTRAVENOUS | Status: AC
  Filled 2020-03-08: qty 500

## 2020-03-08 MED ORDER — LIDOCAINE HCL (PF) 1 % IJ SOLN
INTRAMUSCULAR | Status: DC | PRN
  Administered 2020-03-08: 3 mL

## 2020-03-08 MED ORDER — SODIUM CHLORIDE 0.9% FLUSH: 3.0000 mL | Freq: Two times a day (BID) | INTRAVENOUS | Status: DC

## 2020-03-08 MED ORDER — LIDOCAINE HCL (PF) 1 % IJ SOLN
INTRAMUSCULAR | Status: AC
  Filled 2020-03-08: qty 30

## 2020-03-08 MED ORDER — HEPARIN (PORCINE) IN NACL 1000-0.9 UT/500ML-% IV SOLN
INTRAVENOUS | Status: DC | PRN
  Administered 2020-03-08: 500 mL

## 2020-03-08 MED ORDER — SODIUM CHLORIDE 0.9 % IV SOLN: 250.0000 mL | INTRAVENOUS | Status: DC | PRN

## 2020-03-08 MED ORDER — IODIXANOL 320 MG/ML IV SOLN
INTRAVENOUS | Status: DC | PRN
  Administered 2020-03-08: 40 mL via INTRAVENOUS

## 2020-03-08 MED ORDER — SODIUM CHLORIDE 0.9% FLUSH: 3.0000 mL | INTRAVENOUS | Status: DC | PRN

## 2020-03-08 SURGICAL SUPPLY — 9 items
BAG SNAP BAND KOVER 36X36 (MISCELLANEOUS) ×2 IMPLANT
COVER DOME SNAP 22 D (MISCELLANEOUS) ×2 IMPLANT
KIT MICROPUNCTURE NIT STIFF (SHEATH) ×1 IMPLANT
PROTECTION STATION PRESSURIZED (MISCELLANEOUS) ×2
SHEATH PROBE COVER 6X72 (BAG) ×3 IMPLANT
STATION PROTECTION PRESSURIZED (MISCELLANEOUS) ×1 IMPLANT
STOPCOCK MORSE 400PSI 3WAY (MISCELLANEOUS) ×2 IMPLANT
TRAY PV CATH (CUSTOM PROCEDURE TRAY) ×2 IMPLANT
TUBING CIL FLEX 10 FLL-RA (TUBING) ×2 IMPLANT

## 2020-03-08 NOTE — Op Note (Signed)
    Patient name: [name] MRN: [id number] DOB: 11/15/1929 Sex: female  03/08/2020 Pre-operative Diagnosis: esrd, malfunction of right arm avf Post-operative diagnosis:  Same Surgeon:  Eda Paschal. Donzetta Matters, MD Procedure Performed: 1.  Ultrasound-guided cannulation right arm AV fistula 2.  Right upper extremity shuntogram   Indications: 86 year old female on dialysis via right arm fistula.  She has had difficulty with clearance.  She is now indicated for fistulogram possible invention.  Findings: The fistula at the anastomosis is actually quite diseased.  There is runoff via the cephalic and basilic vein but these are diminutive there is no flow-limiting stenosis.  Patient will be set up for right upper arm AV graft versus conversion upper arm AV fistula and will need catheter at the same time.   Procedure:  The patient was identified in the holding area and taken to room 8 where she was placed upon operative table right arm was prepped and draped timeout was called.  Ultrasound was used to identify the cephalic vein in the mid forearm.  This appeared quite diseased the anastomosis was patent.  The area was anesthetized with 1% lidocaine.  We cannulated with direct ultrasound visualization a micropuncture needle followed wire sheath.  Images saved the permanent record.  We performed right upper extremity shuntogram.  We attempted to perform retrograde views but could not compress given the disease of the fistula.  I elected patient will need conversion upper arm access and a catheter.  I discussed this with the patient.  She tolerated procedure without any complication.  Contrast: 40cc   Darrell Leonhardt C. Donzetta Matters, MD [facility] Office: [phone] Pager: [phone]

## 2020-03-08 NOTE — H&P (Signed)
   History and Physical Update  The patient was interviewed and re-examined.  The patient's previous History and Physical has been reviewed and is unchanged from recent office visit. Plan for right arm fistulogram.  Kishaun Erekson C. Donzetta Matters, MD [facility] Office: [phone] Pager: [phone]   03/08/2020, 7:18 AM

## 2020-03-08 NOTE — Progress Notes (Signed)
Discharge instructions reviewed with pt voices understanding. Pt son called and updated.

## 2020-03-08 NOTE — Discharge Instructions (Signed)

## 2020-03-09 ENCOUNTER — Other Ambulatory Visit: Payer: Self-pay

## 2020-03-16 IMAGING — DX METASTATIC BONE SURVEY
10 series · 10 of 10 positions shown · non-contrast
Comparison: CT lumbar myelogram 06/19/2018

CLINICAL DATA: 88-year-old female with multiple myeloma, pain
radiating through the right leg.

EXAM:
METASTATIC BONE SURVEY

[skull lat]
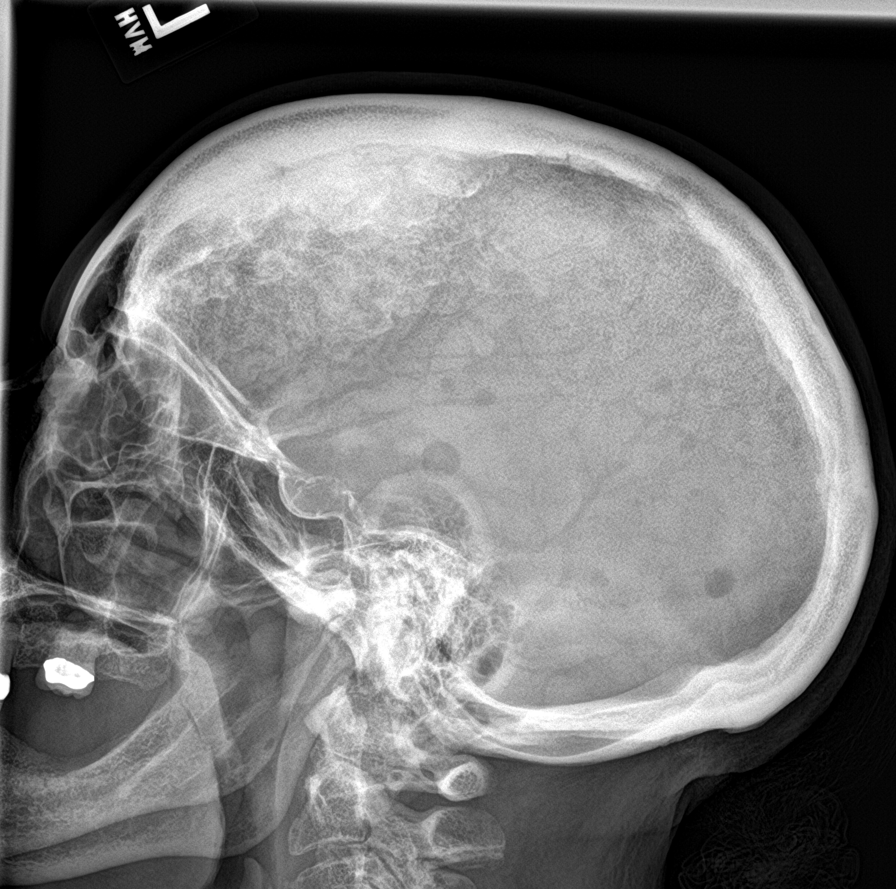

[shoulder ap]
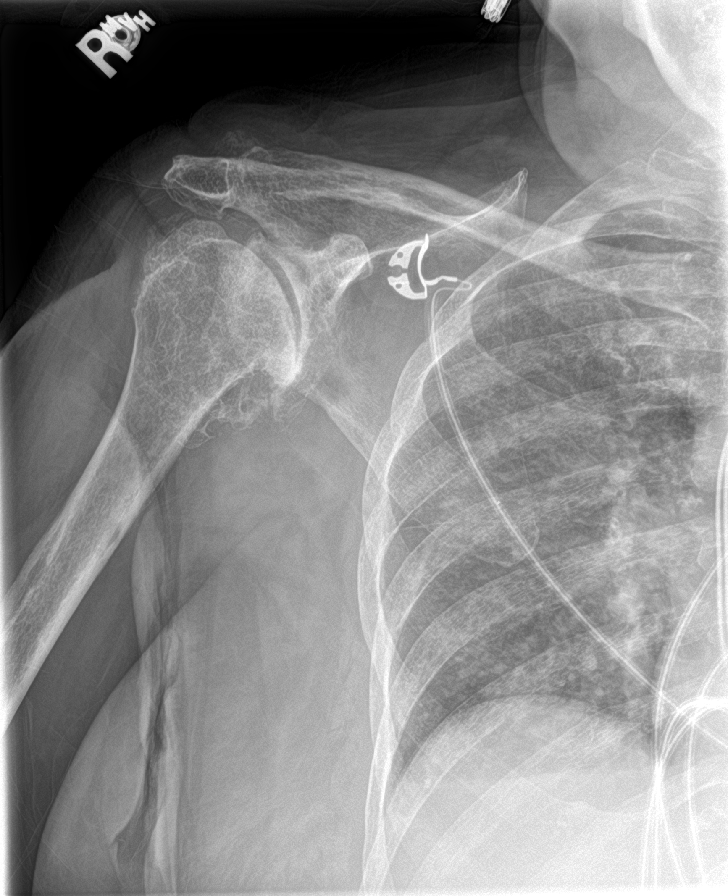

[humerus ap (1 of 2)]
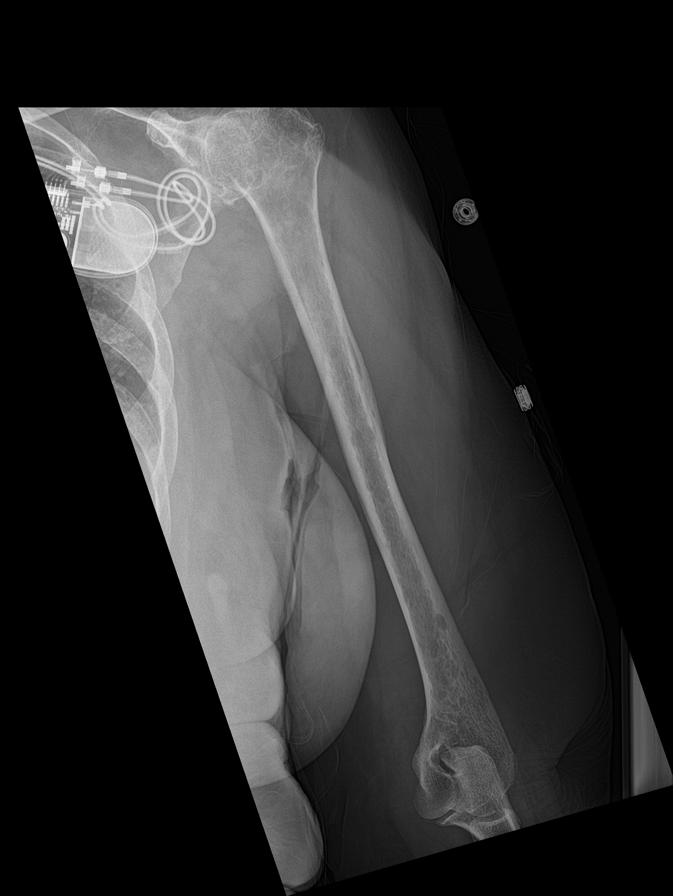

[humerus ap (2 of 2)]
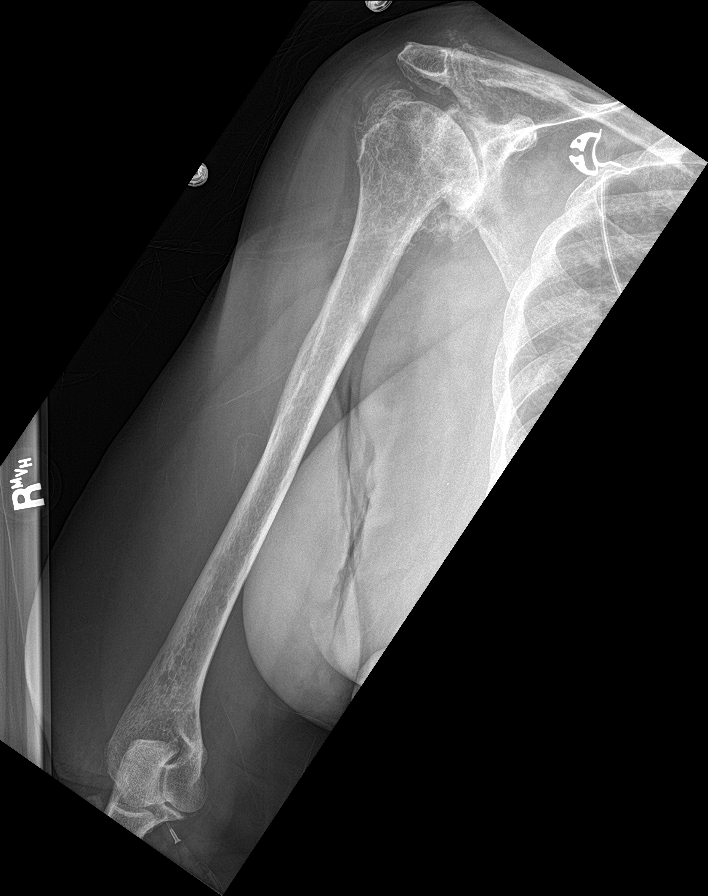

[forearm ap (1 of 2)]
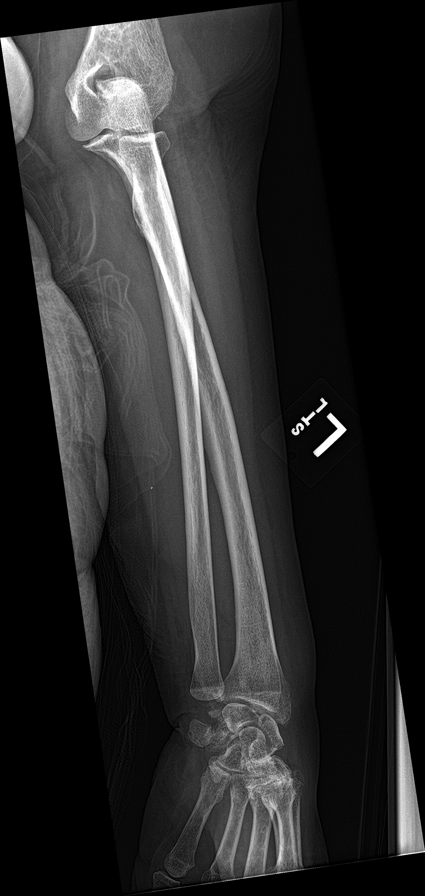

[forearm ap (2 of 2)]
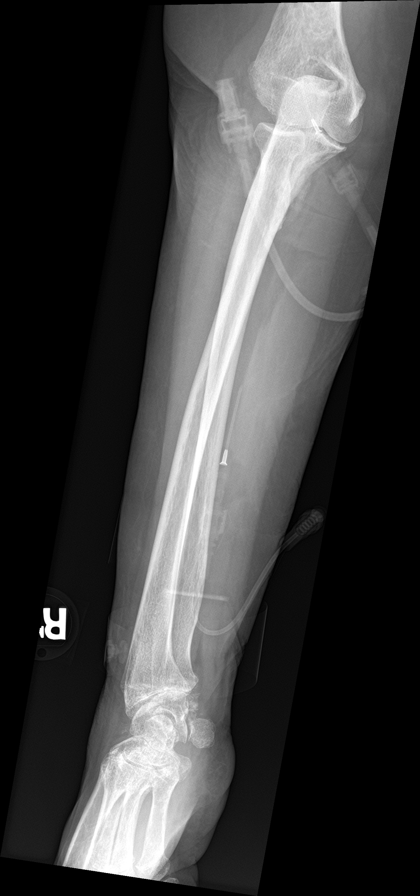

[c-spine ap]
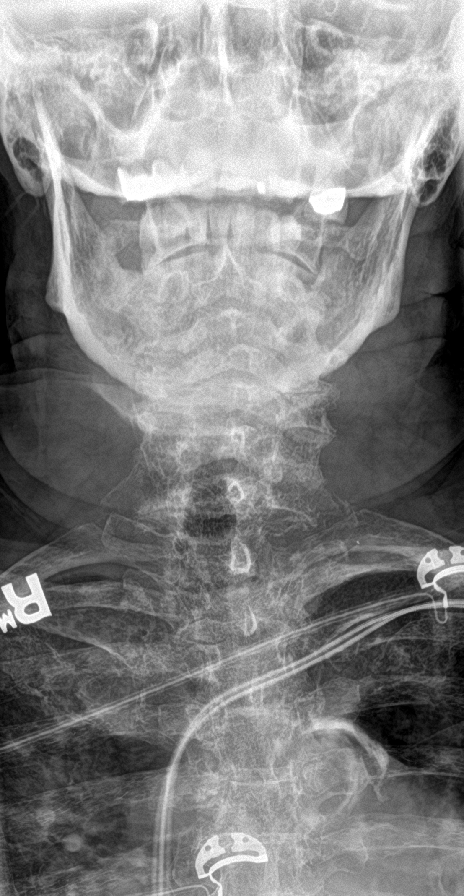

[c-spine lat]
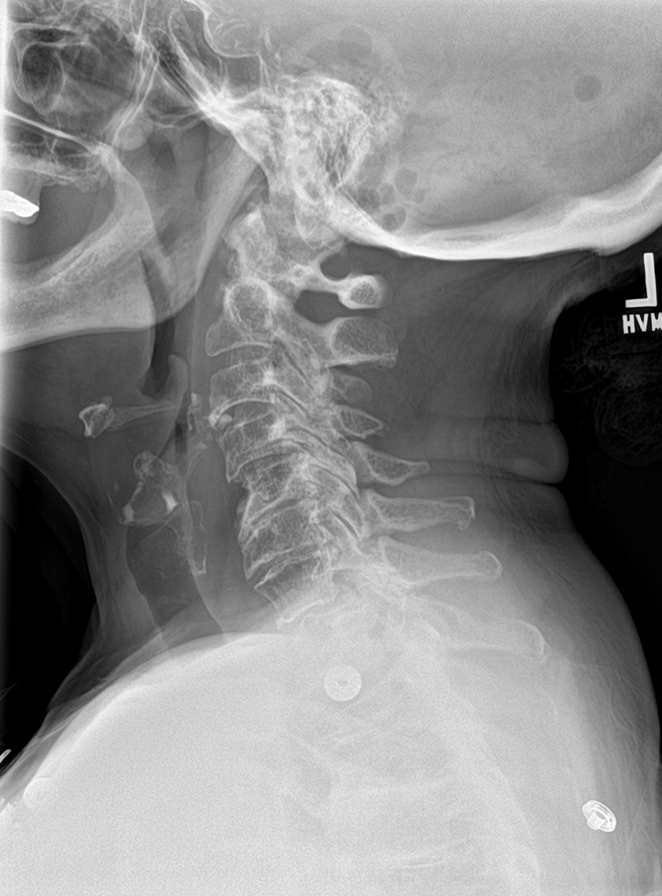

[t-spine ap]
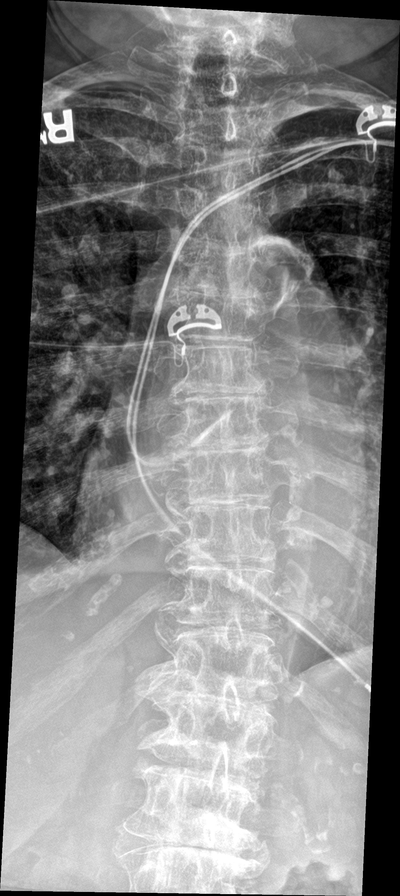

[t-spine lat]
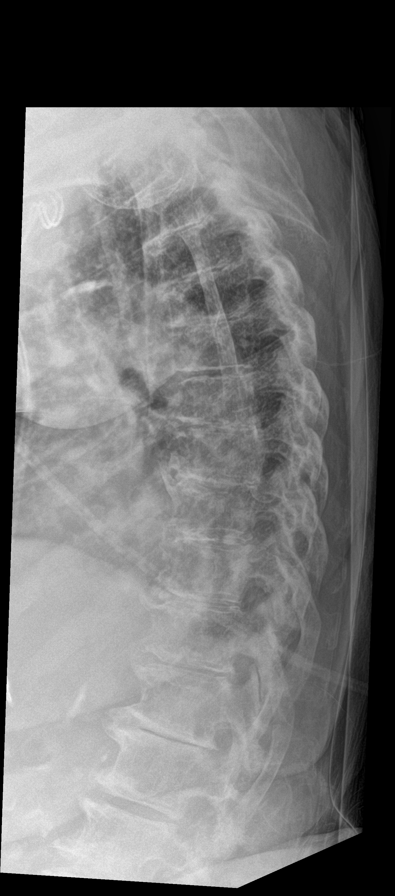

[10 of 10 positions shown; findings below may reference images not displayed]

FINDINGS: Scattered round lucent lesions in the skull.

Degenerative changes in the cervical spine where bone mineralization
appears within normal limits.

Degenerative changes in the thoracic spine and lumbar spine. No
spinal compression fracture identified. Spine bone mineralization
appears within normal limits. Previous lower lumbar interbody
fusion.

Calcified aortic atherosclerosis.

Questionable heterogeneous lucency in the iliac wings. Femoral heads
are normally located and hip joint spaces are normal for age. Pubic
rami appear intact.

No definite lytic lesion in the right lower extremity. Total right
knee arthroplasty.

There is an oval lytic lesion in the distal left femur measuring 11
millimeters (arrow). Prior left knee arthroplasty also. Elsewhere in
the left lower extremity bone mineralization appears normal for age.

Advanced degenerative changes at the right shoulder and wrist. There
are lucent lesions in the distal right humerus.

Cardiomegaly with left chest cardiac pacemaker. No discrete rib
lesion identified.

Small lucent lesions in the mid and distal humerus. Mild to moderate
degenerative changes in the left upper extremity.

No pathologic fracture identified.
IMPRESSION: 1. Round and oval lucent/lytic lesions in the skull, bilateral
humeri, distal left femur, and possibly also in the pelvis are
compatible with multiple myeloma.
No right lower extremity lytic lesion identified.
No pathologic fracture.
2. Advanced degenerative changes in the spine with no vertebral
myeloma evident by plain radiography.
3. Advanced degenerative changes in the right upper extremity.
Bilateral knee arthroplasty.
4.  Aortic Atherosclerosis ([id number]).

## 2020-03-16 NOTE — Progress Notes (Signed)
Pharmacist Chemotherapy Monitoring - Follow Up Assessment    I verify that I have reviewed each item in the below checklist:  . Regimen for the patient is scheduled for the appropriate day and plan matches scheduled date. Marland Kitchen Appropriate non-routine labs are ordered dependent on drug ordered. . If applicable, additional medications reviewed and ordered per protocol based on lifetime cumulative doses and/or treatment regimen.   Plan for follow-up and/or issues identified: No . I-vent associated with next due treatment: No . MD and/or nursing notified: No  [name] 03/16/2020 12:56 PM

## 2020-03-18 ENCOUNTER — Other Ambulatory Visit: Payer: Self-pay

## 2020-03-18 DIAGNOSIS — C9000 Multiple myeloma not having achieved remission: Secondary | ICD-10-CM

## 2020-03-18 MED ORDER — LENALIDOMIDE 2.5 MG PO CAPS: ORAL_CAPSULE | ORAL | 0 refills | Status: DC

## 2020-03-20 IMAGING — US IR RIGHT FLUORO GUIDE CV LINE
1 series · 1 of 1 positions shown · non-contrast
Comparison: none

INDICATION: 88-year-old with acute kidney injury.  Need for hemodialysis.

[Series 1: ir right fluoro guide cv line · 1 of 1 slices shown]
[im 1/1]
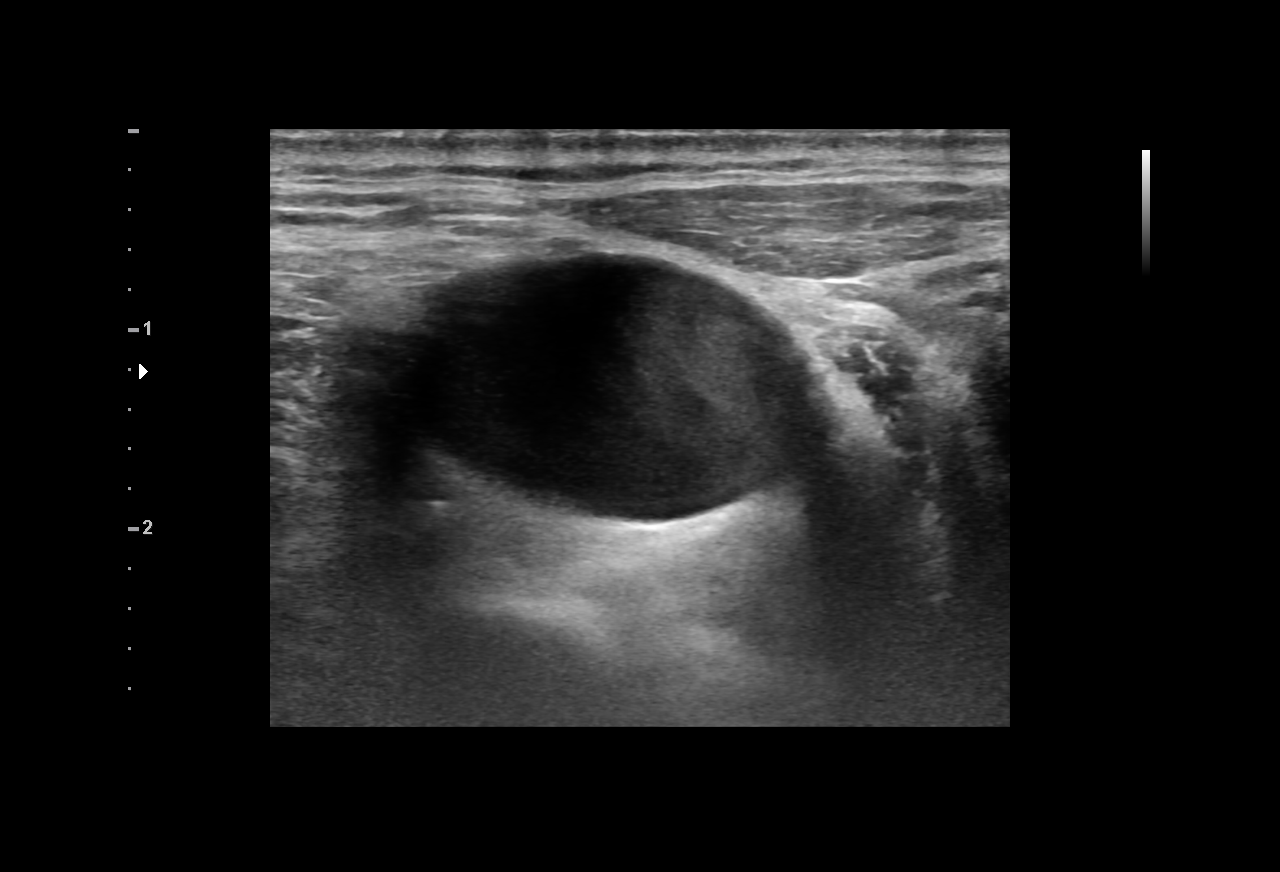

[1 of 1 positions shown; findings below may reference images not displayed]

EXAM:
FLUOROSCOPIC AND ULTRASOUND GUIDED PLACEMENT OF A TUNNELED DIALYSIS
CATHETER

MEDICATIONS:
Vancomycin 1 gm IV; The antibiotic was administered within an
appropriate time interval prior to skin puncture.

ANESTHESIA/SEDATION:
Versed 1.0 mg IV; Fentanyl 50 mcg IV;

Moderate Sedation Time:  22 minutes

The patient was continuously monitored during the procedure by the
interventional radiology nurse under my direct supervision.

FLUOROSCOPY TIME:  Fluoroscopy Time: 30 seconds, 2 mGy

COMPLICATIONS:
None immediate.

PROCEDURE:
Informed consent was obtained for placement of a tunneled dialysis
catheter. The patient was placed supine on the interventional table.
Ultrasound confirmed a patent right internal jugular vein.
Ultrasound images were obtained for documentation. The right side of
the neck and chest was prepped and draped in a sterile fashion. The
right neck was anesthetized with 1% lidocaine. Maximal barrier
sterile technique was utilized including caps, mask, sterile gowns,
sterile gloves, sterile drape, hand hygiene and skin antiseptic. A
small incision was made with #11 blade scalpel. A 21 gauge needle
directed into the right internal jugular vein with ultrasound
guidance. A micropuncture dilator set was placed. A 19 cm tip to
cuff Palindrome catheter was selected. The skin below the right
clavicle was anesthetized and a small incision was made with an #11
blade scalpel. A subcutaneous tunnel was formed to the vein
dermatotomy site. The catheter was brought through the tunnel. The
vein dermatotomy site was dilated to accommodate a peel-away sheath.
The catheter was placed through the peel-away sheath and directed
into the central venous structures. The tip of the catheter was
placed at the superior cavoatrial junction with fluoroscopy.
Fluoroscopic images were obtained for documentation. Both lumens
were found to aspirate and flush well. The proper amount of heparin
was flushed in both lumens. The vein dermatotomy site was closed
using a single layer of absorbable suture and Dermabond. Gel-Foam
was placed in the subcutaneous tract for hemostasis. The catheter
was secured to the skin using Prolene suture.
IMPRESSION: Successful placement of a right jugular tunneled dialysis catheter
using ultrasound and fluoroscopic guidance.

## 2020-03-21 ENCOUNTER — Other Ambulatory Visit: Payer: Self-pay

## 2020-03-21 ENCOUNTER — Emergency Department (HOSPITAL_COMMUNITY): Payer: Medicare HMO

## 2020-03-21 ENCOUNTER — Emergency Department (HOSPITAL_COMMUNITY)
Admission: EM | Admit: 2020-03-21 | Discharge: 2020-03-21 | Disposition: A | Discharge status: Home / Self Care | Payer: Medicare HMO | Attending: Emergency Medicine | Admitting: Emergency Medicine

## 2020-03-21 ENCOUNTER — Telehealth: Payer: Self-pay | Admitting: Physician Assistant

## 2020-03-21 DIAGNOSIS — R519 Headache, unspecified: Secondary | ICD-10-CM | POA: Diagnosis not present

## 2020-03-21 DIAGNOSIS — R55 Syncope and collapse: Secondary | ICD-10-CM | POA: Diagnosis not present

## 2020-03-21 DIAGNOSIS — N186 End stage renal disease: Secondary | ICD-10-CM | POA: Diagnosis not present

## 2020-03-21 DIAGNOSIS — Y999 Unspecified external cause status: Secondary | ICD-10-CM | POA: Diagnosis not present

## 2020-03-21 DIAGNOSIS — Z992 Dependence on renal dialysis: Secondary | ICD-10-CM | POA: Diagnosis not present

## 2020-03-21 DIAGNOSIS — E1122 Type 2 diabetes mellitus with diabetic chronic kidney disease: Secondary | ICD-10-CM | POA: Insufficient documentation

## 2020-03-21 DIAGNOSIS — R937 Abnormal findings on diagnostic imaging of other parts of musculoskeletal system: Secondary | ICD-10-CM | POA: Insufficient documentation

## 2020-03-21 DIAGNOSIS — Y9389 Activity, other specified: Secondary | ICD-10-CM | POA: Insufficient documentation

## 2020-03-21 DIAGNOSIS — Y929 Unspecified place or not applicable: Secondary | ICD-10-CM | POA: Insufficient documentation

## 2020-03-21 DIAGNOSIS — M25511 Pain in right shoulder: Secondary | ICD-10-CM | POA: Diagnosis not present

## 2020-03-21 DIAGNOSIS — R531 Weakness: Secondary | ICD-10-CM | POA: Insufficient documentation

## 2020-03-21 DIAGNOSIS — I120 Hypertensive chronic kidney disease with stage 5 chronic kidney disease or end stage renal disease: Secondary | ICD-10-CM | POA: Diagnosis not present

## 2020-03-21 DIAGNOSIS — W07XXXA Fall from chair, initial encounter: Secondary | ICD-10-CM | POA: Insufficient documentation

## 2020-03-21 DIAGNOSIS — R64 Cachexia: Secondary | ICD-10-CM | POA: Insufficient documentation

## 2020-03-21 DIAGNOSIS — Z8579 Personal history of other malignant neoplasms of lymphoid, hematopoietic and related tissues: Secondary | ICD-10-CM | POA: Insufficient documentation

## 2020-03-21 DIAGNOSIS — M542 Cervicalgia: Secondary | ICD-10-CM | POA: Diagnosis not present

## 2020-03-21 DIAGNOSIS — Z79899 Other long term (current) drug therapy: Secondary | ICD-10-CM | POA: Insufficient documentation

## 2020-03-21 LAB — CBC WITH DIFFERENTIAL/PLATELET
Abs Immature Granulocytes: 0.01 10*3/uL (ref 0.00–0.07)
Basophils Absolute: 0 10*3/uL (ref 0.0–0.1)
Basophils Relative: 1 %
Eosinophils Absolute: 0.1 10*3/uL (ref 0.0–0.5)
Eosinophils Relative: 4 %
HCT: 25.9 % — ABNORMAL LOW (ref 36.0–46.0)
Hemoglobin: 8.6 g/dL — ABNORMAL LOW (ref 12.0–15.0)
Immature Granulocytes: 0 %
Lymphocytes Relative: 25 %
Lymphs Abs: 0.7 10*3/uL (ref 0.7–4.0)
MCH: 30 pg (ref 26.0–34.0)
MCHC: 33.2 g/dL (ref 30.0–36.0)
MCV: 90.2 fL (ref 80.0–100.0)
Monocytes Absolute: 0.6 10*3/uL (ref 0.1–1.0)
Monocytes Relative: 21 %
Neutro Abs: 1.4 10*3/uL — ABNORMAL LOW (ref 1.7–7.7)
Neutrophils Relative %: 49 %
Platelets: 141 10*3/uL — ABNORMAL LOW (ref 150–400)
RBC: 2.87 MIL/uL — ABNORMAL LOW (ref 3.87–5.11)
RDW: 16.5 % — ABNORMAL HIGH (ref 11.5–15.5)
WBC: 2.8 10*3/uL — ABNORMAL LOW (ref 4.0–10.5)
nRBC: 0 % (ref 0.0–0.2)

## 2020-03-21 LAB — COMPREHENSIVE METABOLIC PANEL
ALT: 14 U/L (ref 0–44)
AST: 28 U/L (ref 15–41)
Albumin: 2.8 g/dL — ABNORMAL LOW (ref 3.5–5.0)
Alkaline Phosphatase: 44 U/L (ref 38–126)
Anion gap: 11 (ref 5–15)
BUN: 36 mg/dL — ABNORMAL HIGH (ref 8–23)
CO2: 28 mmol/L (ref 22–32)
Calcium: 8.3 mg/dL — ABNORMAL LOW (ref 8.9–10.3)
Chloride: 96 mmol/L — ABNORMAL LOW (ref 98–111)
Creatinine, Ser: 4.47 mg/dL — ABNORMAL HIGH (ref 0.44–1.00)
GFR calc Af Amer: 10 mL/min — ABNORMAL LOW (ref >60)
GFR calc non Af Amer: 8 mL/min — ABNORMAL LOW (ref >60)
Glucose, Bld: 99 mg/dL (ref 70–99)
Potassium: 4.4 mmol/L (ref 3.5–5.1)
Sodium: 135 mmol/L (ref 135–145)
Total Bilirubin: 0.6 mg/dL (ref 0.3–1.2)
Total Protein: 6.3 g/dL — ABNORMAL LOW (ref 6.5–8.1)

## 2020-03-21 LAB — CBG MONITORING, ED: Glucose-Capillary: 134 mg/dL — ABNORMAL HIGH (ref 70–99)

## 2020-03-21 MED ORDER — ACETAMINOPHEN 325 MG PO TABS
650.0000 mg | ORAL_TABLET | Freq: Once | ORAL | Status: AC
  Administered 2020-03-21: 650 mg via ORAL
  Filled 2020-03-21: qty 2

## 2020-03-21 MED ORDER — SODIUM CHLORIDE 0.9 % IV BOLUS
250.0000 mL | Freq: Once | INTRAVENOUS | Status: AC
  Administered 2020-03-21: 250 mL via INTRAVENOUS

## 2020-03-21 NOTE — Discharge Instructions (Addendum)
You presented to the ED with acute headache and cervical spine pain followed by a near syncopal episode with right shoulder pain.  Your x-rays of your chest and shoulder show no signs of fracture.  Your head CT is unremarkable.  However you do have new lesions in your cervical spine that may be due to multiple myeloma.  Discussed with radiology getting MRIs while in the ED however you have a pacemaker and it is not possible to get MRIs at this time.  However you have no fractures on CT of your cervical spine, no neurologic deficits.  Discussed your case with on-call oncologist and as you are stable with no neuro deficits and your close follow-up discharge home is appropriate.  If anything changes please return to the ED.

## 2020-03-21 NOTE — ED Notes (Signed)
Pt was able to ambulate with walker steady gait.

## 2020-03-21 NOTE — Telephone Encounter (Signed)
   The patient's son called the answering service after-hours today on behalf of his mom, wanted to know name of pacemaker and last implant date. Relayed information found in chart ([facility] MRI from 2017 changeout). The patient's son verbalized understanding and gratitude.  Charlie Pitter, PA-C

## 2020-03-21 NOTE — ED Provider Notes (Signed)
[place] [facility] EMERGENCY DEPARTMENT Provider Note   CSN: [id number] Arrival date & time: 03/21/20  1346     History Chief Complaint  Patient presents with  . Loss of Consciousness    [name] is a 86 y.o. female.  Presents after a witnessed syncopal episode.  Patient goes to dialysis Tuesday, Thursday, Saturday.  Dialysis is going well however she does have some low blood pressures during dialysis but that resolved.  Per family member patient was put in addition the same and then felt generalized weakness and had to sit to a chair and then fell on her right side.  Patient not strike her head.  Additionally patient has cervical spine pain and tenderness, did not hurt her neck during the fall  The history is provided by the patient, medical records and a relative.  Illness Location:  Right shoulder Quality:  Pain Severity:  Moderate Onset quality:  Sudden Timing:  Constant Progression:  Unchanged Chronicity:  New Context:  Patient fell on right shoulder Relieved by:  Nothing tried Worsened by:  Arm movement Ineffective treatments:  None tried Associated symptoms: headaches and loss of consciousness   Associated symptoms: no abdominal pain, no chest pain, no cough, no nausea, no shortness of breath and no vomiting        Past Medical History:  Diagnosis Date  . Anemia   . Benign paroxysmal positional vertigo 04/06/2010  . Benign positional vertigo   . 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 CAROTID BRUIT 02/24/2008  . DIABETES MELLITUS, TYPE II 02/24/2008  . ESRD (end stage renal disease) on dialysis ([place])    tues thurs [facility] sees [place] [facility]  . Glaucoma   . HYPERTENSION 02/24/2008  . Multiple myeloma ([place])   . Obstructive sleep apnea   . Pancytopenia ([place])   . Sick sinus syndrome [facility])     Patient Active Problem List   Diagnosis Date Noted  . Left hip pain 01/12/2020  . Vitamin B12 deficiency 10/20/2019  . Deficiency anemia  09/26/2019  . Peripheral neuropathy due to chemotherapy ([place]) 07/04/2019  . Goals of care, counseling/discussion 05/30/2019  . Pressure injury of skin 05/26/2019  . Abdominal pain 05/25/2019  . Yeast infection of the skin 04/04/2019  . Cancer associated pain 03/26/2019  . Nausea in adult 03/14/2019  . ESRD (end stage renal disease) on dialysis ([place]) 03/14/2019  . Other constipation 03/14/2019  . Pancytopenia, acquired ([place]) 03/12/2019  . Multiple myeloma not having achieved remission ([place]) 03/03/2019  . AKI (acute kidney injury) ([place]) 02/25/2019  . Hypokalemia 02/25/2019  . Acute renal failure ([place])   . Anemia   . Chest pain 03/09/2015  . Shingles 03/09/2015  . Sick sinus syndrome ([place]) 02/03/2015  . Shortness of breath 02/01/2014  . Fatigue 02/01/2014  . BENIGN PAROXYSMAL POSITIONAL VERTIGO 04/06/2010  . BRADYCARDIA 01/27/2009  . Diabetes mellitus ([place]) 02/24/2008  . Obstructive sleep apnea 02/24/2008  . 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 Essential hypertension 02/24/2008  . ALLERGIC RHINITIS 02/24/2008  . CAROTID BRUIT 02/24/2008    Past Surgical History:  Procedure Laterality Date  . A/V FISTULAGRAM Right 03/08/2020   Procedure: A/V FISTULAGRAM- Right Arm;  Surgeon: Waynetta Sandy, MD;  Location: [facility];  Service: Cardiovascular;  Laterality: Right;  . ABDOMINAL HYSTERECTOMY     1980's  . AV FISTULA PLACEMENT Right 03/11/2019   Procedure: CREATION RIGHT ARM RADIOCEPHALIC ARTERIOVENOUS FISTULA;  Surgeon: Angelia Mould, MD;  Location: [facility] OR;  Service: Vascular;  Laterality: Right;  . BACK SURGERY     x 3  . BIOPSY  06/24/2019   Procedure: BIOPSY;  Surgeon: Ronald Lobo, MD;  Location: [facility];  Service: Endoscopy;;  . EP IMPLANTABLE DEVICE N/A 05/16/2016   Procedure: PPM Generator Changeout;  Surgeon: Thompson Grayer, MD;  Location: [facility];  Service: Cardiovascular;  Laterality: N/A;  . ESOPHAGOGASTRODUODENOSCOPY  (EGD) WITH PROPOFOL N/A 06/24/2019   Procedure: ESOPHAGOGASTRODUODENOSCOPY (EGD) WITH PROPOFOL;  Surgeon: Ronald Lobo, MD;  Location: [facility];  Service: Endoscopy;  Laterality: N/A;  . FISTULA SUPERFICIALIZATION Right 03/11/2019   Procedure: FISTULA SUPERFICIALIZATION;  Surgeon: Angelia Mould, MD;  Location: [facility];  Service: Vascular;  Laterality: Right;  . IR FLUORO GUIDE CV LINE RIGHT  03/07/2019  . IR US GUIDE VASC ACCESS RIGHT  03/07/2019  . KNEE SURGERY Right    2003  . KNEE SURGERY Left    2011  . PACEMAKER INSERTION       OB History   No obstetric history on file.     Family History  Problem Relation Age of Onset  . Congestive Heart Failure Mother   . Tuberculosis Mother   . Multiple myeloma Mother   . Bone cancer Father   . Arthritis Father   . Breast cancer Sister   . Colon cancer Sister   . Hypertension Sister   . Dementia Sister   . Alcohol abuse Son     Social History   Tobacco Use  . Smoking status: Never Smoker  . Smokeless tobacco: Never Used  Substance Use Topics  . Alcohol use: No  . Drug use: No    Home Medications Prior to Admission medications   Medication Sig Start Date End Date Taking? Authorizing Provider  acetaminophen (TYLENOL) 500 MG tablet Take 500 mg by mouth 2 (two) times daily as needed for moderate pain.    [provider]  acyclovir (ZOVIRAX) 400 MG tablet TAKE 1 TABLET BY MOUTH EVERY DAY Patient taking differently: Take 400 mg by mouth daily.  08/11/19   Heath Lark, MD  allopurinol (ZYLOPRIM) 100 MG tablet Take 0.5 tablets (50 mg total) by mouth daily. 03/12/19   Nita Sells, MD  B Complex-C-Zn-Folic Acid (DIALYVITE 253-GUYQ 15) 0.8 MG TABS Take 1 tablet by mouth every evening.  03/24/19   [provider]  cholecalciferol (VITAMIN D3) 25 MCG (1000 UT) tablet Take 1,000 Units by mouth daily.     [provider]  dexamethasone (DECADRON) 2 MG tablet Take 1 tablet (2 mg total) by mouth  every other day. 03/22/20   Heath Lark, MD  dorzolamide (TRUSOPT) 2 % ophthalmic solution Place 1 drop into the left eye 2 (two) times daily.  12/21/17   [provider]  fluorometholone (FML) 0.1 % ophthalmic suspension Place 1 drop into the right eye daily.  02/01/19   [provider]  isosorbide-hydrALAZINE (BIDIL) 20-37.5 MG tablet Take 1 tablet by mouth 2 (two) times a day. 03/11/19   Nita Sells, MD  lactulose (CHRONULAC) 10 GM/15ML solution TAKE 15 MLS (10 G TOTAL) BY MOUTH 2 (TWO) TIMES DAILY. Patient not taking: Reported on 03/12/2020 08/14/19   Heath Lark, MD  lenalidomide (REVLIMID) 2.5 MG capsule Take 1 capsule three times a week after dialysis on Tuesday, Thursday and Saturdays 03/18/20   Heath Lark, MD  LUMIGAN 0.01 % SOLN Place 1 drop into the left eye at bedtime.  02/01/19   [provider]  LYRICA 50 MG capsule Take 50 mg by mouth at bedtime.  06/14/15   [provider]  ondansetron (ZOFRAN) 4 MG tablet Take 1 tablet (4 mg total) by mouth every 8 (eight) hours as needed for nausea. 03/14/19   Heath Lark, MD  pantoprazole (PROTONIX) 40 MG tablet Take 1 tablet (40 mg total) by mouth daily as needed (acid reflux/indigestion.). 06/26/19   Heath Lark, MD  polyethylene glycol (MIRALAX / GLYCOLAX) 17 g packet Take 17 g by mouth daily.     [provider]  prochlorperazine (COMPAZINE) 5 MG tablet Take 1 tablet (5 mg total) by mouth every 6 (six) hours as needed for nausea or vomiting. 03/14/19   Heath Lark, MD  senna-docusate (SENOKOT-S) 8.6-50 MG tablet Take 2 tablets by mouth 2 (two) times daily. Patient taking differently: Take 2 tablets by mouth in the morning and at bedtime.  03/11/19   Nita Sells, MD  sevelamer carbonate (RENVELA) 800 MG tablet Take 800-1,600 mg by mouth 3 (three) times daily with meals. Depending on meal size depends on the 1 or 2 tablets 04/25/19   [provider]  sodium chloride (OCEAN) 0.65 % SOLN  nasal spray Place 1 spray into both nostrils 4 (four) times daily as needed for congestion.     [provider]    Allergies    Aspirin, Hydrocodone-acetaminophen, Morphine, Penicillins, Betaine, Dacarbazine, Ketorolac tromethamine, Brimonidine, Diltiazem hcl, Hydrocodone-acetaminophen, Neurontin [gabapentin], and Sulfacetamide sodium  Review of Systems   Review of Systems  Respiratory: Negative for cough and shortness of breath.   Cardiovascular: Negative for chest pain.  Gastrointestinal: Negative for abdominal pain, nausea and vomiting.  Musculoskeletal: Positive for neck pain.  Neurological: Positive for loss of consciousness, syncope and headaches.  All other systems reviewed and are negative.   Physical Exam Updated Vital Signs BP (!) 116/46 (BP Location: Left Arm)   Pulse 70   Temp 97.8 F (36.6 C) (Oral)   Resp 16   SpO2 99%   Physical Exam Vitals and nursing note reviewed.  Constitutional:      General: She is not in acute distress.    Appearance: She is cachectic.  HENT:     Head: Normocephalic and atraumatic.     Right Ear: External ear normal.     Left Ear: External ear normal.     Nose: Nose normal.     Mouth/Throat:     Mouth: Mucous membranes are moist.     Pharynx: Oropharynx is clear.  Eyes:     Extraocular Movements: Extraocular movements intact.     Comments: Patient has significant bilateral cataracts  Cardiovascular:     Rate and Rhythm: Normal rate and regular rhythm.     Heart sounds: No murmur.  Pulmonary:     Effort: Pulmonary effort is normal. No respiratory distress.     Breath sounds: Normal breath sounds.  Abdominal:     Palpations: Abdomen is soft.     Tenderness: There is no abdominal tenderness.  Musculoskeletal:        General: Normal range of motion.     Cervical back: Tenderness present.  Skin:    General: Skin is warm and dry.  Neurological:     General: No focal deficit present.     Mental Status: She is alert and  oriented to person, place, and time.     Cranial Nerves: No cranial nerve deficit.     Motor: No weakness.  Psychiatric:  Mood and Affect: Mood normal.        Behavior: Behavior normal.     ED Results / Procedures / Treatments   Labs (all labs ordered are listed, but only abnormal results are displayed) Labs Reviewed  CBC WITH DIFFERENTIAL/PLATELET - Abnormal; Notable for the following components:      Result Value   WBC 2.8 (*)    RBC 2.87 (*)    Hemoglobin 8.6 (*)    HCT 25.9 (*)    RDW 16.5 (*)    Platelets 141 (*)    Neutro Abs 1.4 (*)    All other components within normal limits  COMPREHENSIVE METABOLIC PANEL - Abnormal; Notable for the following components:   Chloride 96 (*)    BUN 36 (*)    Creatinine, Ser 4.47 (*)    Calcium 8.3 (*)    Total Protein 6.3 (*)    Albumin 2.8 (*)    GFR calc non Af Amer 8 (*)    GFR calc Af Amer 10 (*)    All other components within normal limits  CBG MONITORING, ED - Abnormal; Notable for the following components:   Glucose-Capillary 134 (*)    All other components within normal limits    EKG EKG Interpretation  Date/Time:  Sunday March 21 2020 13:55:35 EDT Ventricular Rate:  70 PR Interval:  196 QRS Duration: 194 QT Interval:  530 QTC Calculation: 572 R Axis:   -76 Text Interpretation: AV dual-paced rhythm Abnormal ECG no significant change since Feb 2021 Confirmed by Sherwood Gambler [phone] on 03/21/2020 2:25:49 PM   Radiology DG Chest 2 View  Result Date: 03/21/2020 CLINICAL DATA:  Right shoulder pain and nausea. EXAM: CHEST - 2 VIEW COMPARISON:  May 25, 2019 FINDINGS: A dual lead AICD is noted. There is no evidence of acute infiltrate, pleural effusion or pneumothorax. The heart size and mediastinal contours are within normal limits. There is moderate severity calcification of the aortic arch. Marked severity degenerative changes seen throughout the bilateral shoulders and thoracic spine. IMPRESSION: No active  cardiopulmonary disease. Electronically Signed   By: Virgina Norfolk M.D.   On: 03/21/2020 16:13   DG Shoulder Right  Result Date: 03/21/2020 CLINICAL DATA:  Right shoulder pain. EXAM: RIGHT SHOULDER - 2+ VIEW COMPARISON:  None. FINDINGS: There is no evidence of an acute fracture or dislocation. Marked severity chronic and degenerative changes are seen involving the glenohumeral articulation and adjacent portions of the right humeral head and right glenoid. Chronic changes are also seen along the greater tubercle of the right humeral head. Soft tissues are unremarkable. IMPRESSION: Marked severity chronic and degenerative changes without evidence of acute osseous abnormality. Electronically Signed   By: Virgina Norfolk M.D.   On: 03/21/2020 16:11   CT Head Wo Contrast  Result Date: 03/21/2020 CLINICAL DATA:  Neck pain. Neck pain and RIGHT shoulder pain just prior to syncope. Patient did not fall. RIGHT shoulder pain and nausea. EXAM: CT HEAD WITHOUT CONTRAST CT CERVICAL SPINE WITHOUT CONTRAST TECHNIQUE: Multidetector CT imaging of the head and cervical spine was performed following the standard protocol without intravenous contrast. Multiplanar CT image reconstructions of the cervical spine were also generated. COMPARISON:  CT head on 03/08/2015, CT of the cervical spine on 02/23/2015 FINDINGS: CT HEAD FINDINGS Brain: There is mild central and cortical atrophy. There is no intra or extra-axial fluid collection or mass lesion. The basilar cisterns and ventricles have a normal appearance. There is no CT evidence for acute infarction or  hemorrhage. Vascular: There is dense calcification of the internal carotid arteries, associated with tortuosity. No hyperdense vessels. Skull: Hyperostosis frontalis. There are new circumscribed lytic lesions within the RIGHT frontal bone, RIGHT parietal bone, LEFT parietal bone consistent with metastatic disease versus myeloma. There is fluid within a previously aerated RIGHT  petrous ridge air cell. Sinuses/Orbits: No acute finding. Other: None CT CERVICAL SPINE FINDINGS Alignment: There is 3 millimeters anterolisthesis of C4 on C5. There is 4 millimeters anterolisthesis of C7 on T1. Skull base and vertebrae: Numerous new lytic lesions are identified throughout the cervical spine, involving vertebral bodies as well as posterior elements. Largest lesion is identified at T1. Lesion involves an expanded LEFT transverse process and extends into the vertebral body. There is significant associated destruction of the cortex of the transverse process. Similar lesions are identified within the proximal ribs. There is no definite evidence for acute fracture. Soft tissues and spinal canal: No prevertebral fluid or swelling. No visible canal hematoma. Disc levels: Significant disc height loss and uncovertebral spurring primarily at C4-5, C5-6, C6-7. Upper chest: Lung apices are clear. Other: Carotid artery calcification. IMPRESSION: 1. No evidence for acute intracranial abnormality.  Atrophy. 2. Interval development of lytic lesions within the skull, consistent metastatic disease or myeloma. 3. Numerous new lytic lesions throughout the cervical spine and proximal ribs consistent with metastatic disease versus myeloma. 4. Expanded LEFT transverse process of T1 is associated with cortical destruction, and possibly accounts for the patient's pain. Recommend further evaluation with MRI of the cervical spine. 5. Significant degenerative changes. 6. No evidence for acute cervical spine abnormality. These results were called by telephone at the time of interpretation on 03/21/2020 at 4:14 pm to provider [facility] , who verbally acknowledged these results. Electronically Signed   By: Nolon Nations M.D.   On: 03/21/2020 16:15   CT Cervical Spine Wo Contrast  Result Date: 03/21/2020 CLINICAL DATA:  Neck pain. Neck pain and RIGHT shoulder pain just prior to syncope. Patient did not fall. RIGHT shoulder  pain and nausea. EXAM: CT HEAD WITHOUT CONTRAST CT CERVICAL SPINE WITHOUT CONTRAST TECHNIQUE: Multidetector CT imaging of the head and cervical spine was performed following the standard protocol without intravenous contrast. Multiplanar CT image reconstructions of the cervical spine were also generated. COMPARISON:  CT head on 03/08/2015, CT of the cervical spine on 02/23/2015 FINDINGS: CT HEAD FINDINGS Brain: There is mild central and cortical atrophy. There is no intra or extra-axial fluid collection or mass lesion. The basilar cisterns and ventricles have a normal appearance. There is no CT evidence for acute infarction or hemorrhage. Vascular: There is dense calcification of the internal carotid arteries, associated with tortuosity. No hyperdense vessels. Skull: Hyperostosis frontalis. There are new circumscribed lytic lesions within the RIGHT frontal bone, RIGHT parietal bone, LEFT parietal bone consistent with metastatic disease versus myeloma. There is fluid within a previously aerated RIGHT petrous ridge air cell. Sinuses/Orbits: No acute finding. Other: None CT CERVICAL SPINE FINDINGS Alignment: There is 3 millimeters anterolisthesis of C4 on C5. There is 4 millimeters anterolisthesis of C7 on T1. Skull base and vertebrae: Numerous new lytic lesions are identified throughout the cervical spine, involving vertebral bodies as well as posterior elements. Largest lesion is identified at T1. Lesion involves an expanded LEFT transverse process and extends into the vertebral body. There is significant associated destruction of the cortex of the transverse process. Similar lesions are identified within the proximal ribs. There is no definite evidence for acute fracture. Soft tissues and  spinal canal: No prevertebral fluid or swelling. No visible canal hematoma. Disc levels: Significant disc height loss and uncovertebral spurring primarily at C4-5, C5-6, C6-7. Upper chest: Lung apices are clear. Other: Carotid  artery calcification. IMPRESSION: 1. No evidence for acute intracranial abnormality.  Atrophy. 2. Interval development of lytic lesions within the skull, consistent metastatic disease or myeloma. 3. Numerous new lytic lesions throughout the cervical spine and proximal ribs consistent with metastatic disease versus myeloma. 4. Expanded LEFT transverse process of T1 is associated with cortical destruction, and possibly accounts for the patient's pain. Recommend further evaluation with MRI of the cervical spine. 5. Significant degenerative changes. 6. No evidence for acute cervical spine abnormality. These results were called by telephone at the time of interpretation on 03/21/2020 at 4:14 pm to provider [facility] , who verbally acknowledged these results. Electronically Signed   By: Nolon Nations M.D.   On: 03/21/2020 16:15    Procedures Procedures (including critical care time)  Medications Ordered in ED Medications  acetaminophen (TYLENOL) tablet 650 mg (650 mg Oral Given 03/21/20 1750)  sodium chloride 0.9 % bolus 250 mL (0 mLs Intravenous Stopped 03/21/20 1915)    ED Course  I have reviewed the triage vital signs and the nursing notes.  Pertinent labs & imaging results that were available during my care of the patient were reviewed by me and considered in my medical decision making (see chart for details).  Clinical Course as of Mar 22 1233  Sun Mar 21, 2020  1643 Patient has concerning findings on CT of the C-spine for multiple neuroma versus neoplasm.  Radiology recommends MRI.  Based on patient's history of multiple and likely worsening disease.  MRIs of the spine with and without contrast were ordered.   [AL]    Clinical Course User Index [AL] Delma Post, MD   MDM Rules/Calculators/A&P                      Differential diagnosis: Right shoulder dislocation, humerus fracture, head bleed, cervical spine injury, progression of multiple myeloma, other cancer  ED physician  interpretation of imaging: CT cervical spine with new lytic lesions not present on previous studies.  Chest x-ray without acute fracture, hemopneumothorax, widened mediastinum, full pneumonia.  Right shoulder x-ray without dislocation, no humerus fracture. ED physician interpretation of EKG: No STEMI.  Unchanged from previous.  Pacemaker. ED physician interpretation of labs: No critical lab values on screening labs.  Patient anemia worse from a few months ago but likely consistent with patient's multiple myeloma as well as current cancer treatments  MDM: Patient is an 86 year old female with a complex medical history presenting with acute neck pain, headache as well as near syncopal fall without loss of consciousness found to have new lytic lesions likely associated with multiple myeloma with stable vital signs appropriate for discharge home and close outpatient follow-up.  Patient's vital signs are stable, patient afebrile.  Patient's physical exam remarkable for right shoulder pain but no deformity.  Attempts were made to get MRI of patient's spine however based on patient's pacemaker as well as limited resources on the weekend the studies were unavailable.  Patient discussed with the on-call oncologist.  After reviewing the patient's chart and stability, no indication for admission.  Family comfortable with this plan.  They will follow closely with patient's oncologist in the morning.  Patient had some mild hypotension, received small IV fluid bolus, ambulated to the bathroom without difficulty and without dizziness.  Patient  states the pain in her neck feels similar to her bone pain from multiple myeloma.  Through shared decision-making patient appropriate for discharge home.  Both family and patient comfortable with this plan.  Strict return precautions given.  Diagnosis, treatment and plan of care was discussed and agreed upon with patient.  Patient comfortable with discharge at this time.   Key  discharge instructions: You presented to the ED with acute headache and cervical spine pain followed by a near syncopal episode with right shoulder pain.  Your x-rays of your chest and shoulder show no signs of fracture.  Your head CT is unremarkable.  However you do have new lesions in your cervical spine that may be due to multiple myeloma.  Discussed with radiology getting MRIs while in the ED however you have a pacemaker and it is not possible to get MRIs at this time.  However you have no fractures on CT of your cervical spine, no neurologic deficits.  Discussed your case with on-call oncologist and as you are stable with no neuro deficits and your close follow-up discharge home is appropriate.  If anything changes please return to the ED.   Final Clinical Impression(s) / ED Diagnoses Final diagnoses:  Near syncope  Abnormal CT of spine    Rx / DC Orders ED Discharge Orders    None       Delma Post, MD 03/22/20 1235    Elnora Morrison, MD 03/22/20 2340

## 2020-03-21 NOTE — ED Notes (Signed)
Pt back from imaging

## 2020-03-21 NOTE — ED Triage Notes (Signed)
Per EMS Pt from home with son. Today pt reported neck pain and rt shoulder pain just before syncope. Son with Pt and Pt did not fall. Pt still has rt shoulder pain ,nausea, . Pt goes o Dialysis T.T.S. CB G 111, BP 126/62

## 2020-03-22 ENCOUNTER — Inpatient Hospital Stay: Payer: Medicare HMO | Attending: Hematology and Oncology | Admitting: Hematology and Oncology

## 2020-03-22 ENCOUNTER — Other Ambulatory Visit (HOSPITAL_COMMUNITY)
Admission: RE | Admit: 2020-03-22 | Discharge: 2020-03-22 | Disposition: A | Discharge status: Home / Self Care | Payer: Medicare HMO | Source: Ambulatory Visit | Attending: Vascular Surgery | Admitting: Vascular Surgery

## 2020-03-22 ENCOUNTER — Other Ambulatory Visit: Payer: Self-pay

## 2020-03-22 ENCOUNTER — Inpatient Hospital Stay: Payer: Medicare HMO

## 2020-03-22 ENCOUNTER — Telehealth: Payer: Self-pay | Admitting: Internal Medicine

## 2020-03-22 VITALS — BP 122/50 | HR 73 | Temp 98.9°F | Resp 18 | Ht 65.0 in | Wt 125.8 lb

## 2020-03-22 DIAGNOSIS — Z5112 Encounter for antineoplastic immunotherapy: Secondary | ICD-10-CM | POA: Diagnosis present

## 2020-03-22 DIAGNOSIS — N186 End stage renal disease: Secondary | ICD-10-CM | POA: Diagnosis not present

## 2020-03-22 DIAGNOSIS — R634 Abnormal weight loss: Secondary | ICD-10-CM | POA: Diagnosis not present

## 2020-03-22 DIAGNOSIS — C9000 Multiple myeloma not having achieved remission: Secondary | ICD-10-CM | POA: Insufficient documentation

## 2020-03-22 DIAGNOSIS — I252 Old myocardial infarction: Secondary | ICD-10-CM | POA: Insufficient documentation

## 2020-03-22 DIAGNOSIS — R110 Nausea: Secondary | ICD-10-CM | POA: Diagnosis not present

## 2020-03-22 DIAGNOSIS — G893 Neoplasm related pain (acute) (chronic): Secondary | ICD-10-CM | POA: Diagnosis not present

## 2020-03-22 DIAGNOSIS — D61818 Other pancytopenia: Secondary | ICD-10-CM | POA: Diagnosis not present

## 2020-03-22 DIAGNOSIS — Z79899 Other long term (current) drug therapy: Secondary | ICD-10-CM | POA: Diagnosis not present

## 2020-03-22 DIAGNOSIS — Z01812 Encounter for preprocedural laboratory examination: Secondary | ICD-10-CM | POA: Diagnosis present

## 2020-03-22 DIAGNOSIS — Z20822 Contact with and (suspected) exposure to covid-19: Secondary | ICD-10-CM | POA: Diagnosis not present

## 2020-03-22 DIAGNOSIS — I952 Hypotension due to drugs: Secondary | ICD-10-CM

## 2020-03-22 DIAGNOSIS — Z7189 Other specified counseling: Secondary | ICD-10-CM

## 2020-03-22 DIAGNOSIS — I959 Hypotension, unspecified: Secondary | ICD-10-CM | POA: Insufficient documentation

## 2020-03-22 DIAGNOSIS — E538 Deficiency of other specified B group vitamins: Secondary | ICD-10-CM | POA: Diagnosis not present

## 2020-03-22 DIAGNOSIS — M25511 Pain in right shoulder: Secondary | ICD-10-CM | POA: Diagnosis not present

## 2020-03-22 DIAGNOSIS — Z992 Dependence on renal dialysis: Secondary | ICD-10-CM

## 2020-03-22 LAB — COMPREHENSIVE METABOLIC PANEL
ALT: 11 U/L (ref 0–44)
AST: 17 U/L (ref 15–41)
Albumin: 3.2 g/dL — ABNORMAL LOW (ref 3.5–5.0)
Alkaline Phosphatase: 52 U/L (ref 38–126)
Anion gap: 13 (ref 5–15)
BUN: 55 mg/dL — ABNORMAL HIGH (ref 8–23)
CO2: 28 mmol/L (ref 22–32)
Calcium: 8.7 mg/dL — ABNORMAL LOW (ref 8.9–10.3)
Chloride: 96 mmol/L — ABNORMAL LOW (ref 98–111)
Creatinine, Ser: 5.42 mg/dL (ref 0.44–1.00)
GFR calc Af Amer: 8 mL/min — ABNORMAL LOW (ref >60)
GFR calc non Af Amer: 7 mL/min — ABNORMAL LOW (ref >60)
Glucose, Bld: 101 mg/dL — ABNORMAL HIGH (ref 70–99)
Potassium: 4.4 mmol/L (ref 3.5–5.1)
Sodium: 137 mmol/L (ref 135–145)
Total Bilirubin: 0.6 mg/dL (ref 0.3–1.2)
Total Protein: 6.9 g/dL (ref 6.5–8.1)

## 2020-03-22 LAB — CBC WITH DIFFERENTIAL/PLATELET
Abs Immature Granulocytes: 0 10*3/uL (ref 0.00–0.07)
Basophils Absolute: 0 10*3/uL (ref 0.0–0.1)
Basophils Relative: 1 %
Eosinophils Absolute: 0.2 10*3/uL (ref 0.0–0.5)
Eosinophils Relative: 8 %
HCT: 27.6 % — ABNORMAL LOW (ref 36.0–46.0)
Hemoglobin: 9 g/dL — ABNORMAL LOW (ref 12.0–15.0)
Immature Granulocytes: 0 %
Lymphocytes Relative: 31 %
Lymphs Abs: 0.9 10*3/uL (ref 0.7–4.0)
MCH: 29.6 pg (ref 26.0–34.0)
MCHC: 32.6 g/dL (ref 30.0–36.0)
MCV: 90.8 fL (ref 80.0–100.0)
Monocytes Absolute: 0.5 10*3/uL (ref 0.1–1.0)
Monocytes Relative: 18 %
Neutro Abs: 1.2 10*3/uL — ABNORMAL LOW (ref 1.7–7.7)
Neutrophils Relative %: 42 %
Platelets: 149 10*3/uL — ABNORMAL LOW (ref 150–400)
RBC: 3.04 MIL/uL — ABNORMAL LOW (ref 3.87–5.11)
RDW: 16.3 % — ABNORMAL HIGH (ref 11.5–15.5)
WBC: 2.8 10*3/uL — ABNORMAL LOW (ref 4.0–10.5)
nRBC: 0 % (ref 0.0–0.2)

## 2020-03-22 LAB — SARS CORONAVIRUS 2 (TAT 6-24 HRS): SARS Coronavirus 2: NEGATIVE

## 2020-03-22 MED ORDER — ACETAMINOPHEN 325 MG PO TABS
ORAL_TABLET | ORAL | Status: AC
  Filled 2020-03-22: qty 2

## 2020-03-22 MED ORDER — CYANOCOBALAMIN 1000 MCG/ML IJ SOLN
INTRAMUSCULAR | Status: AC
  Filled 2020-03-22: qty 1

## 2020-03-22 MED ORDER — DARATUMUMAB-HYALURONIDASE-FIHJ 1800-30000 MG-UT/15ML ~~LOC~~ SOLN
1800.0000 mg | Freq: Once | SUBCUTANEOUS | Status: AC
  Administered 2020-03-22: 1800 mg via SUBCUTANEOUS
  Filled 2020-03-22: qty 15

## 2020-03-22 MED ORDER — CYANOCOBALAMIN 1000 MCG/ML IJ SOLN
1000.0000 ug | Freq: Once | INTRAMUSCULAR | Status: AC
  Administered 2020-03-22: 1000 ug via INTRAMUSCULAR

## 2020-03-22 MED ORDER — DEXAMETHASONE 4 MG PO TABS
8.0000 mg | ORAL_TABLET | Freq: Once | ORAL | Status: AC
  Administered 2020-03-22: 8 mg via ORAL

## 2020-03-22 MED ORDER — ACETAMINOPHEN 325 MG PO TABS
650.0000 mg | ORAL_TABLET | Freq: Once | ORAL | Status: AC
  Administered 2020-03-22: 650 mg via ORAL

## 2020-03-22 MED ORDER — DIPHENHYDRAMINE HCL 25 MG PO CAPS
25.0000 mg | ORAL_CAPSULE | Freq: Once | ORAL | Status: AC
  Administered 2020-03-22: 25 mg via ORAL

## 2020-03-22 MED ORDER — PROCHLORPERAZINE MALEATE 10 MG PO TABS
ORAL_TABLET | ORAL | Status: AC
  Filled 2020-03-22: qty 1

## 2020-03-22 MED ORDER — PROCHLORPERAZINE MALEATE 10 MG PO TABS
10.0000 mg | ORAL_TABLET | Freq: Once | ORAL | Status: AC
  Administered 2020-03-22: 10 mg via ORAL

## 2020-03-22 MED ORDER — BORTEZOMIB CHEMO SQ INJECTION 3.5 MG (2.5MG/ML)
0.7800 mg/m2 | Freq: Once | INTRAMUSCULAR | Status: AC
  Administered 2020-03-22: 1.25 mg via SUBCUTANEOUS
  Filled 2020-03-22: qty 0.5

## 2020-03-22 MED ORDER — DIPHENHYDRAMINE HCL 25 MG PO CAPS
ORAL_CAPSULE | ORAL | Status: AC
  Filled 2020-03-22: qty 1

## 2020-03-22 MED ORDER — DEXAMETHASONE 2 MG PO TABS
2.0000 mg | ORAL_TABLET | ORAL | 0 refills | Status: DC
Start: 2020-03-22 — End: 2020-05-31

## 2020-03-22 MED ORDER — DEXAMETHASONE 4 MG PO TABS
ORAL_TABLET | ORAL | Status: AC
  Filled 2020-03-22: qty 2

## 2020-03-22 NOTE — Progress Notes (Signed)
Reported critical creatinine of 5.42 to Dr. Alvy Bimler.

## 2020-03-22 NOTE — Telephone Encounter (Signed)
Pt c/o BP issue: STAT if pt c/o blurred vision, one-sided weakness or slurred speech  1. What are your last 5 BP readings? 122/50, 110/50   2. Are you having any other symptoms (ex. Dizziness, headache, blurred vision, passed out)? Passed out yesterday, not dizzy now  3. What is your BP issue? Patient's son states the patietn went to see her Oncologist today who told her her BP is too low. Patient's son states she passed out yesterday, but is not having any symptoms now. She has an appointment 04/02/2020

## 2020-03-22 NOTE — Progress Notes (Signed)
Labs reviewed by Dr. Alvy Bimler. OK to treat with today 's labs per Dr. Alvy Bimler.

## 2020-03-22 NOTE — Patient Instructions (Signed)
[facility] Discharge Instructions for Patients Receiving Chemotherapy  Today you received the following chemotherapy agents: bortezomib and daratumumab.  To help prevent nausea and vomiting after your treatment, we encourage you to take your nausea medication as directed.   If you develop nausea and vomiting that is not controlled by your nausea medication, call the clinic.   BELOW ARE SYMPTOMS THAT SHOULD BE REPORTED IMMEDIATELY:  *FEVER GREATER THAN 100.5 F  *CHILLS WITH OR WITHOUT FEVER  NAUSEA AND VOMITING THAT IS NOT CONTROLLED WITH YOUR NAUSEA MEDICATION  *UNUSUAL SHORTNESS OF BREATH  *UNUSUAL BRUISING OR BLEEDING  TENDERNESS IN MOUTH AND THROAT WITH OR WITHOUT PRESENCE OF ULCERS  *URINARY PROBLEMS  *BOWEL PROBLEMS  UNUSUAL RASH Items with * indicate a potential emergency and should be followed up as soon as possible.  Feel free to call the clinic should you have any questions or concerns. The clinic phone number is [phone].  Please show the [facility] at check-in to the Emergency Department and triage nurse.

## 2020-03-23 ENCOUNTER — Telehealth: Payer: Self-pay | Admitting: Hematology and Oncology

## 2020-03-23 ENCOUNTER — Encounter: Payer: Self-pay | Admitting: Hematology and Oncology

## 2020-03-23 DIAGNOSIS — I959 Hypotension, unspecified: Secondary | ICD-10-CM | POA: Insufficient documentation

## 2020-03-23 LAB — KAPPA/LAMBDA LIGHT CHAINS
Kappa free light chain: 109.6 mg/L — ABNORMAL HIGH (ref 3.3–19.4)
Kappa, lambda light chain ratio: 5.86 — ABNORMAL HIGH (ref 0.26–1.65)
Lambda free light chains: 18.7 mg/L (ref 5.7–26.3)

## 2020-03-23 NOTE — Assessment & Plan Note (Signed)
She has multifactorial pancytopenia, likely worsened with recent addition of Revlimid She does not need transfusion support We will monitor closely

## 2020-03-23 NOTE — Assessment & Plan Note (Signed)
We have a long discussion in our previous visit I brought updates to her family the rationale behind recent medication changes and the rationale behind recent medical changes We have agreed on adding low-dose lenalidomide to her current regimen to augment disease response She is developing mild pancytopenia with just a low dose Revlimid At this point in time, we are not able to adjust the dose further She will continue taking lenalidomide after each dialysis episode, 3 times a week For now, she will continue subcutaneous daratumumab and bortezomib every 3 weeks I plan to see her again in 2 weeks for further follow-up I will add dexamethasone for pain management to see if that would help

## 2020-03-23 NOTE — Assessment & Plan Note (Signed)
We discussed the limitations of treatment options due to her end-stage renal disease °She will continue hemodialysis °

## 2020-03-23 NOTE — Telephone Encounter (Signed)
Per review of Pt's oncology note, there is concern that transient hypotension is causing Pt to pass out/fall.  Will schedule Pt with first available cardiac APP to evaluate what can be done for Pt.

## 2020-03-23 NOTE — Assessment & Plan Note (Signed)
We reviewed multiple imaging studies extensively She is symptomatic from bone lesion We discussed the risk and benefits of referral to radiation oncology versus pain management versus the addition of low-dose steroids She is interested to try low-dose steroid therapy I plan to see her again in 2 weeks If she have minimum pain control, I will refer her to radiation oncology to consider palliative radiation therapy Previously, she tolerated pain medication poorly with severe constipation

## 2020-03-23 NOTE — Progress Notes (Signed)
[place] OFFICE PROGRESS NOTE  Patient Care Team: Lucianne Lei, MD as PCP - General (Family Medicine)  ASSESSMENT & PLAN:  Multiple myeloma not having achieved remission [facility]) We have a long discussion in our previous visit I brought updates to her family the rationale behind recent medication changes and the rationale behind recent medical changes We have agreed on adding low-dose lenalidomide to her current regimen to augment disease response She is developing mild pancytopenia with just a low dose Revlimid At this point in time, we are not able to adjust the dose further She will continue taking lenalidomide after each dialysis episode, 3 times a week For now, she will continue subcutaneous daratumumab and bortezomib every 3 weeks I plan to see her again in 2 weeks for further follow-up I will add dexamethasone for pain management to see if that would help  Pancytopenia, acquired [facility]) She has multifactorial pancytopenia, likely worsened with recent addition of Revlimid She does not need transfusion support We will monitor closely  Cancer associated pain We reviewed multiple imaging studies extensively She is symptomatic from bone lesion We discussed the risk and benefits of referral to radiation oncology versus pain management versus the addition of low-dose steroids She is interested to try low-dose steroid therapy I plan to see her again in 2 weeks If she have minimum pain control, I will refer her to radiation oncology to consider palliative radiation therapy Previously, she tolerated pain medication poorly with severe constipation  ESRD (end stage renal disease) on dialysis [facility]) We discussed the limitations of treatment options due to her end-stage renal disease She will continue hemodialysis  Hypotension She had recent hypotension She is on nitrates for history of MI The patient has been losing weight I recommend the family to contact her  nephrologist as well as her cardiologist for medical management, hopefully we can avoid another syncopal episode and fall   No orders of the defined types were placed in this encounter.   All questions were answered. The patient knows to call the clinic with any problems, questions or concerns. The total time spent in the appointment was 40 minutes encounter with patients including review of chart and various tests results, discussions about plan of care and coordination of care plan   Heath Lark, MD 03/23/2020 8:12 AM  INTERVAL HISTORY: Please see below for problem oriented charting. She returns with her son for further follow-up Her daughter is also available over the phone The patient recently had a fall secondary to syncopal episode and went to the emergency department She had multiple imaging studies Since she was discharged from the emergency room, she is weak She continues to have gradual weight loss She denies nausea or diarrhea with recent treatment She is having intermittent pain in her back and is only taking acetaminophen which did not control her pain She continues to have stable peripheral neuropathy  SUMMARY OF ONCOLOGIC HISTORY: Oncology History  Multiple myeloma not having achieved remission ([place])  03/03/2019 Initial Diagnosis   Multiple myeloma not having achieved remission ([place])   03/03/2019 Imaging   1. Round and oval lucent/lytic lesions in the skull, bilateral humeri, distal left femur, and possibly also in the pelvis are compatible with multiple myeloma. No right lower extremity lytic lesion identified. No pathologic fracture. 2. Advanced degenerative changes in the spine with no vertebral myeloma evident by plain radiography. 3. Advanced degenerative changes in the right upper extremity. Bilateral knee arthroplasty. 4.  Aortic Atherosclerosis (ICD10-I70.0).  03/04/2019 -  Chemotherapy   The patient had bortezomib for chemotherapy treatment.    03/04/2019 Bone  Marrow Biopsy   Bone Marrow, Aspirate,Biopsy, and Clot - HYPERCELLULAR BONE MARROW FOR AGE WITH PLASMACYTOSIS. - SEE COMMENT. PERIPHERAL BLOOD: - NORMOCYTIC-NORMOCHROMIC ANEMIA. Diagnosis Note The bone marrow is hypercellular for age with increased number of atypical plasma cells averaging 30% of all cells in the aspirate associated with interstitial infiltrates and numerous variably sized aggregates in the clot and biopsy sections. 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 Confirmatory in situ hybridization for kappa and lambda as well as CD138 will be performed and the results reported in an addendum. The background shows trilineage hematopoiesis with mild non-specific changes primarily involving the erythroid series. Correlation with cytogenetic and FISH studies is recommended.    06/20/2019 -  Chemotherapy   The patient had daratumumab-hyaluronidase-fihj (DARZALEX FASPRO) 1800-30000 MG-UT/15ML chemo SQ injection 1,800 mg, 1,800 mg, Subcutaneous,  Once, 12 of 12 cycles Administration: 1,800 mg (06/20/2019), 1,800 mg (06/27/2019), 1,800 mg (07/04/2019), 1,800 mg (07/11/2019), 1,800 mg (07/18/2019), 1,800 mg (07/25/2019), 1,800 mg (08/08/2019), 1,800 mg (08/15/2019), 1,800 mg (09/05/2019), 1,800 mg (12/01/2019), 1,800 mg (02/02/2020), 1,800 mg (09/26/2019), 1,800 mg (10/20/2019), 1,800 mg (11/10/2019), 1,800 mg (12/22/2019), 1,800 mg (01/12/2020), 1,800 mg (02/23/2020), 1,800 mg (03/22/2020)  for chemotherapy treatment.      REVIEW OF SYSTEMS:   Constitutional: Denies fevers, chills or abnormal weight loss Eyes: Denies blurriness of vision Ears, nose, mouth, throat, and face: Denies mucositis or sore throat Respiratory: Denies cough, dyspnea or wheezes Cardiovascular: Denies palpitation, chest discomfort or lower extremity swelling Gastrointestinal:  Denies nausea, heartburn or change in bowel habits Skin: Denies abnormal skin rashes Lymphatics: Denies new lymphadenopathy or easy  bruising Neurological:Denies numbness, tingling or new weaknesses Behavioral/Psych: Mood is stable, no new changes  All other systems were reviewed with the patient and are negative.  I have reviewed the past medical history, past surgical history, social history and family history with the patient and they are unchanged from previous note.  ALLERGIES:  is allergic to aspirin; hydrocodone-acetaminophen; morphine; penicillins; betaine; dacarbazine; ketorolac tromethamine; brimonidine; diltiazem hcl; hydrocodone-acetaminophen; neurontin [gabapentin]; and sulfacetamide sodium.  MEDICATIONS:  Current Outpatient Medications  Medication Sig Dispense Refill  . acetaminophen (TYLENOL) 500 MG tablet Take 500 mg by mouth 2 (two) times daily as needed for moderate pain.    Marland Kitchen acyclovir (ZOVIRAX) 400 MG tablet TAKE 1 TABLET BY MOUTH EVERY DAY (Patient taking differently: Take 400 mg by mouth daily. ) 90 tablet 2  . allopurinol (ZYLOPRIM) 100 MG tablet Take 0.5 tablets (50 mg total) by mouth daily. 30 tablet 0  . B Complex-C-Zn-Folic Acid (DIALYVITE 962-[id number] 15) 0.8 MG TABS Take 1 tablet by mouth every evening.     . cholecalciferol (VITAMIN D3) 25 MCG (1000 UT) tablet Take 1,000 Units by mouth daily.     Marland Kitchen dexamethasone (DECADRON) 2 MG tablet Take 1 tablet (2 mg total) by mouth every other day. 30 tablet 0  . dorzolamide (TRUSOPT) 2 % ophthalmic solution Place 1 drop into the left eye 2 (two) times daily.   1  . fluorometholone (FML) 0.1 % ophthalmic suspension Place 1 drop into the right eye daily.     . isosorbide-hydrALAZINE (BIDIL) 20-37.5 MG tablet Take 1 tablet by mouth 2 (two) times a day. 60 tablet 0  . lactulose (CHRONULAC) 10 GM/15ML solution TAKE 15 MLS (10 G TOTAL) BY MOUTH 2 (TWO) TIMES DAILY. (Patient not taking: Reported on 03/12/2020) 473  mL 0  . lenalidomide (REVLIMID) 2.5 MG capsule Take 1 capsule three times a week after dialysis on Tuesday, Thursday and Saturdays 12 capsule 0  . LUMIGAN  0.01 % SOLN Place 1 drop into the left eye at bedtime.     Marland Kitchen LYRICA 50 MG capsule Take 50 mg by mouth at bedtime.     . ondansetron (ZOFRAN) 4 MG tablet Take 1 tablet (4 mg total) by mouth every 8 (eight) hours as needed for nausea. 30 tablet 1  . pantoprazole (PROTONIX) 40 MG tablet Take 1 tablet (40 mg total) by mouth daily as needed (acid reflux/indigestion.). 30 tablet 11  . polyethylene glycol (MIRALAX / GLYCOLAX) 17 g packet Take 17 g by mouth daily.     . prochlorperazine (COMPAZINE) 5 MG tablet Take 1 tablet (5 mg total) by mouth every 6 (six) hours as needed for nausea or vomiting. 30 tablet 1  . senna-docusate (SENOKOT-S) 8.6-50 MG tablet Take 2 tablets by mouth 2 (two) times daily. (Patient taking differently: Take 2 tablets by mouth in the morning and at bedtime. ) 30 tablet 0  . sevelamer carbonate (RENVELA) 800 MG tablet Take 800-1,600 mg by mouth 3 (three) times daily with meals. Depending on meal size depends on the 1 or 2 tablets    . sodium chloride (OCEAN) 0.65 % SOLN nasal spray Place 1 spray into both nostrils 4 (four) times daily as needed for congestion.      No current facility-administered medications for this visit.    PHYSICAL EXAMINATION: ECOG PERFORMANCE STATUS: 2 - Symptomatic, <50% confined to bed  Vitals:   03/22/20 1114  BP: (!) 122/50  Pulse: 73  Resp: 18  Temp: 98.9 F (37.2 C)  SpO2: 100%   Filed Weights   03/22/20 1114  Weight: 125 lb 12.8 oz (57.1 kg)    GENERAL:alert, no distress and comfortable.  She is frail NEURO: alert & oriented x 3 with fluent speech, no focal motor/sensory deficits  LABORATORY DATA:  I have reviewed the data as listed    Component Value Date/Time   NA 137 03/22/2020 1040   K 4.4 03/22/2020 1040   CL 96 (L) 03/22/2020 1040   CO2 28 03/22/2020 1040   GLUCOSE 101 (H) 03/22/2020 1040   BUN 55 (H) 03/22/2020 1040   CREATININE 5.42 (HH) 03/22/2020 1040   CREATININE 2.71 (H) 10/20/2019 1128   CREATININE 1.03 (H)  05/11/2016 0931   CALCIUM 8.7 (L) 03/22/2020 1040   PROT 6.9 03/22/2020 1040   ALBUMIN 3.2 (L) 03/22/2020 1040   AST 17 03/22/2020 1040   AST 18 10/20/2019 1128   ALT 11 03/22/2020 1040   ALT 10 10/20/2019 1128   ALKPHOS 52 03/22/2020 1040   BILITOT 0.6 03/22/2020 1040   BILITOT 0.4 10/20/2019 1128   GFRNONAA 7 (L) 03/22/2020 1040   GFRNONAA 15 (L) 10/20/2019 1128   GFRAA 8 (L) 03/22/2020 1040   GFRAA 18 (L) 10/20/2019 1128    No results found for: SPEP, UPEP  Lab Results  Component Value Date   WBC 2.8 (L) 03/22/2020   NEUTROABS 1.2 (L) 03/22/2020   HGB 9.0 (L) 03/22/2020   HCT 27.6 (L) 03/22/2020   MCV 90.8 03/22/2020   PLT 149 (L) 03/22/2020      Chemistry      Component Value Date/Time   NA 137 03/22/2020 1040   K 4.4 03/22/2020 1040   CL 96 (L) 03/22/2020 1040   CO2 28 03/22/2020 1040  BUN 55 (H) 03/22/2020 1040   CREATININE 5.42 (HH) 03/22/2020 1040   CREATININE 2.71 (H) 10/20/2019 1128   CREATININE 1.03 (H) 05/11/2016 0931      Component Value Date/Time   CALCIUM 8.7 (L) 03/22/2020 1040   ALKPHOS 52 03/22/2020 1040   AST 17 03/22/2020 1040   AST 18 10/20/2019 1128   ALT 11 03/22/2020 1040   ALT 10 10/20/2019 1128   BILITOT 0.6 03/22/2020 1040   BILITOT 0.4 10/20/2019 1128       RADIOGRAPHIC STUDIES: I have reviewed multiple imaging studies with the patient and family I have personally reviewed the radiological images as listed and agreed with the findings in the report. DG Chest 2 View  Result Date: 03/21/2020 CLINICAL DATA:  Right shoulder pain and nausea. EXAM: CHEST - 2 VIEW COMPARISON:  May 25, 2019 FINDINGS: A dual lead AICD is noted. There is no evidence of acute infiltrate, pleural effusion or pneumothorax. The heart size and mediastinal contours are within normal limits. There is moderate severity calcification of the aortic arch. Marked severity degenerative changes seen throughout the bilateral shoulders and thoracic spine. IMPRESSION: No  active cardiopulmonary disease. Electronically Signed   By: Virgina Norfolk M.D.   On: 03/21/2020 16:13   DG Shoulder Right  Result Date: 03/21/2020 CLINICAL DATA:  Right shoulder pain. EXAM: RIGHT SHOULDER - 2+ VIEW COMPARISON:  None. FINDINGS: There is no evidence of an acute fracture or dislocation. Marked severity chronic and degenerative changes are seen involving the glenohumeral articulation and adjacent portions of the right humeral head and right glenoid. Chronic changes are also seen along the greater tubercle of the right humeral head. Soft tissues are unremarkable. IMPRESSION: Marked severity chronic and degenerative changes without evidence of acute osseous abnormality. Electronically Signed   By: Virgina Norfolk M.D.   On: 03/21/2020 16:11   CT Head Wo Contrast  Result Date: 03/21/2020 CLINICAL DATA:  Neck pain. Neck pain and RIGHT shoulder pain just prior to syncope. Patient did not fall. RIGHT shoulder pain and nausea. EXAM: CT HEAD WITHOUT CONTRAST CT CERVICAL SPINE WITHOUT CONTRAST TECHNIQUE: Multidetector CT imaging of the head and cervical spine was performed following the standard protocol without intravenous contrast. Multiplanar CT image reconstructions of the cervical spine were also generated. COMPARISON:  CT head on 03/08/2015, CT of the cervical spine on 02/23/2015 FINDINGS: CT HEAD FINDINGS Brain: There is mild central and cortical atrophy. There is no intra or extra-axial fluid collection or mass lesion. The basilar cisterns and ventricles have a normal appearance. There is no CT evidence for acute infarction or hemorrhage. Vascular: There is dense calcification of the internal carotid arteries, associated with tortuosity. No hyperdense vessels. Skull: Hyperostosis frontalis. There are new circumscribed lytic lesions within the RIGHT frontal bone, RIGHT parietal bone, LEFT parietal bone consistent with metastatic disease versus myeloma. There is fluid within a previously  aerated RIGHT petrous ridge air cell. Sinuses/Orbits: No acute finding. Other: None CT CERVICAL SPINE FINDINGS Alignment: There is 3 millimeters anterolisthesis of C4 on C5. There is 4 millimeters anterolisthesis of C7 on T1. Skull base and vertebrae: Numerous new lytic lesions are identified throughout the cervical spine, involving vertebral bodies as well as posterior elements. Largest lesion is identified at T1. Lesion involves an expanded LEFT transverse process and extends into the vertebral body. There is significant associated destruction of the cortex of the transverse process. Similar lesions are identified within the proximal ribs. There is no definite evidence for acute fracture. Soft  tissues and spinal canal: No prevertebral fluid or swelling. No visible canal hematoma. Disc levels: Significant disc height loss and uncovertebral spurring primarily at C4-5, C5-6, C6-7. Upper chest: Lung apices are clear. Other: Carotid artery calcification. IMPRESSION: 1. No evidence for acute intracranial abnormality.  Atrophy. 2. Interval development of lytic lesions within the skull, consistent metastatic disease or myeloma. 3. Numerous new lytic lesions throughout the cervical spine and proximal ribs consistent with metastatic disease versus myeloma. 4. Expanded LEFT transverse process of T1 is associated with cortical destruction, and possibly accounts for the patient's pain. Recommend further evaluation with MRI of the cervical spine. 5. Significant degenerative changes. 6. No evidence for acute cervical spine abnormality. These results were called by telephone at the time of interpretation on 03/21/2020 at 4:14 pm to provider [facility] , who verbally acknowledged these results. Electronically Signed   By: Nolon Nations M.D.   On: 03/21/2020 16:15   CT Cervical Spine Wo Contrast  Result Date: 03/21/2020 CLINICAL DATA:  Neck pain. Neck pain and RIGHT shoulder pain just prior to syncope. Patient did not fall.  RIGHT shoulder pain and nausea. EXAM: CT HEAD WITHOUT CONTRAST CT CERVICAL SPINE WITHOUT CONTRAST TECHNIQUE: Multidetector CT imaging of the head and cervical spine was performed following the standard protocol without intravenous contrast. Multiplanar CT image reconstructions of the cervical spine were also generated. COMPARISON:  CT head on 03/08/2015, CT of the cervical spine on 02/23/2015 FINDINGS: CT HEAD FINDINGS Brain: There is mild central and cortical atrophy. There is no intra or extra-axial fluid collection or mass lesion. The basilar cisterns and ventricles have a normal appearance. There is no CT evidence for acute infarction or hemorrhage. Vascular: There is dense calcification of the internal carotid arteries, associated with tortuosity. No hyperdense vessels. Skull: Hyperostosis frontalis. There are new circumscribed lytic lesions within the RIGHT frontal bone, RIGHT parietal bone, LEFT parietal bone consistent with metastatic disease versus myeloma. There is fluid within a previously aerated RIGHT petrous ridge air cell. Sinuses/Orbits: No acute finding. Other: None CT CERVICAL SPINE FINDINGS Alignment: There is 3 millimeters anterolisthesis of C4 on C5. There is 4 millimeters anterolisthesis of C7 on T1. Skull base and vertebrae: Numerous new lytic lesions are identified throughout the cervical spine, involving vertebral bodies as well as posterior elements. Largest lesion is identified at T1. Lesion involves an expanded LEFT transverse process and extends into the vertebral body. There is significant associated destruction of the cortex of the transverse process. Similar lesions are identified within the proximal ribs. There is no definite evidence for acute fracture. Soft tissues and spinal canal: No prevertebral fluid or swelling. No visible canal hematoma. Disc levels: Significant disc height loss and uncovertebral spurring primarily at C4-5, C5-6, C6-7. Upper chest: Lung apices are clear.  Other: Carotid artery calcification. IMPRESSION: 1. No evidence for acute intracranial abnormality.  Atrophy. 2. Interval development of lytic lesions within the skull, consistent metastatic disease or myeloma. 3. Numerous new lytic lesions throughout the cervical spine and proximal ribs consistent with metastatic disease versus myeloma. 4. Expanded LEFT transverse process of T1 is associated with cortical destruction, and possibly accounts for the patient's pain. Recommend further evaluation with MRI of the cervical spine. 5. Significant degenerative changes. 6. No evidence for acute cervical spine abnormality. These results were called by telephone at the time of interpretation on 03/21/2020 at 4:14 pm to provider [facility] , who verbally acknowledged these results. Electronically Signed   By: Nolon Nations M.D.  On: 03/21/2020 16:15   PERIPHERAL VASCULAR CATHETERIZATION  Result Date: 03/08/2020 Patient name: STARIA BIRKHEAD MRN: [id number] DOB: 07-30-1930 Sex: female 03/08/2020 Pre-operative Diagnosis: esrd, malfunction of right arm avf Post-operative diagnosis:  Same Surgeon:  Eda Paschal. Donzetta Matters, MD Procedure Performed: 1.  Ultrasound-guided cannulation right arm AV fistula 2.  Right upper extremity shuntogram Indications: 86 year old female on dialysis via right arm fistula.  She has had difficulty with clearance.  She is now indicated for fistulogram possible invention. Findings: The fistula at the anastomosis is actually quite diseased.  There is runoff via the cephalic and basilic vein but these are diminutive there is no flow-limiting stenosis. Patient will be set up for right upper arm AV graft versus conversion upper arm AV fistula and will need catheter at the same time.  Procedure:  The patient was identified in the holding area and taken to room 8 where she was placed upon operative table right arm was prepped and draped timeout was called.  Ultrasound was used to identify the cephalic vein in  the mid forearm.  This appeared quite diseased the anastomosis was patent.  The area was anesthetized with 1% lidocaine.  We cannulated with direct ultrasound visualization a micropuncture needle followed wire sheath.  Images saved the permanent record.  We performed right upper extremity shuntogram.  We attempted to perform retrograde views but could not compress given the disease of the fistula.  I elected patient will need conversion upper arm access and a catheter.  I discussed this with the patient.  She tolerated procedure without any complication. Contrast: 40cc Brandon C. Donzetta Matters, MD [facility] Office: [phone] Pager: [phone]  VAS US DUPLEX DIALYSIS ACCESS (AVF, AVG)  Result Date: 02/27/2020 DIALYSIS ACCESS Reason for Exam: Unable to dialyze through AVF/AVG. Access Site: Right Upper Extremity. Access Type: Radial-cephalic AVF. History: 03/11/2019: Right radial cephalic AV fistula with subsequent          Superficialization of right radiocephalic AV fistula. Comparison Study: 04/23/2019: Arteriovenous Fistula- Velocities less than 100                   cm/s noted in the ACF. PAtent right radiocephalic                   arteriovenous fistula with no evidence of hemodynamically                   signifcant stenosis. Performing Technologist: Caralee Ates BA, RVT, RDMS  Examination Guidelines: A complete evaluation includes B-mode imaging, spectral Doppler, color Doppler, and power Doppler as needed of all accessible portions of each vessel. Unilateral testing is considered an integral part of a complete examination. Limited examinations for reoccurring indications may be performed as noted.  Findings: +--------------------+----------+-----------------+--------+ AVF                 PSV (cm/s)Flow Vol (mL/min)Comments +--------------------+----------+-----------------+--------+ Native artery inflow    66           200                 +--------------------+----------+-----------------+--------+ AVF Anastomosis        432                              +--------------------+----------+-----------------+--------+  +------------+----------+-------------+----------+------------------+ OUTFLOW VEINPSV (cm/s)Diameter (cm)Depth (cm)     Describe      +------------+----------+-------------+----------+------------------+ Prox UA  17        0.62        0.27                      +------------+----------+-------------+----------+------------------+ Mid UA          24        0.61        0.21                      +------------+----------+-------------+----------+------------------+ Dist UA         23        0.61        0.20    competing branch  +------------+----------+-------------+----------+------------------+ AC Fossa        30        0.91        0.15   change in Diameter +------------+----------+-------------+----------+------------------+ Prox Forearm   517        0.21        0.56        stenotic      +------------+----------+-------------+----------+------------------+  Summary: Patent arteriovenous fistula. Arteriovenous fistula-Velocities less than 100cm/s noted in the antecubital fossa through the proximal upper arm. Arteriovenous fistula-Stenosis noted in the proximal forearm. *See table(s) above for measurements and observations.  Diagnosing physician: Servando Snare MD Electronically signed by Servando Snare MD on 02/27/2020 at 3:25:43 PM.    --------------------------------------------------------------------------------   Final

## 2020-03-23 NOTE — Telephone Encounter (Signed)
Scheduled per 6/7 sch message. Pt aware of appts on 6/21

## 2020-03-23 NOTE — Assessment & Plan Note (Signed)
She had recent hypotension She is on nitrates for history of MI The patient has been losing weight I recommend the family to contact her nephrologist as well as her cardiologist for medical management, hopefully we can avoid another syncopal episode and fall

## 2020-03-24 ENCOUNTER — Encounter (HOSPITAL_COMMUNITY): Payer: Self-pay | Admitting: Vascular Surgery

## 2020-03-24 ENCOUNTER — Encounter: Payer: Self-pay | Admitting: Internal Medicine

## 2020-03-24 ENCOUNTER — Other Ambulatory Visit: Payer: Self-pay

## 2020-03-24 NOTE — Progress Notes (Signed)
Pt denies SOB and chest pain. Pt stated that she is under the care of Dr. Rayann Heman, Cardiology (EP) and  Dr. Lucianne Lei, PCP. Pt denies having a cardiac cath. Pt requested that her son, [name], complete the SDW-pre-op assessment. Pt stated that she will not take any medications on the morning of surgery because her BP has been low; she will take them post-op. Pt son made aware to have pt stop taking vitamins, fish oil and herbal medications. Do not take any NSAIDs ie: Ibuprofen, Advil, Naproxen (Aleve), Motrin, BC and Goody Powder. Pt son made aware to have pt check CBG every 2 hours prior to arrival to hospital on DOS. Pt son made aware to treat a CBG < 70 with  4 ounces of apple  juice, wait 15 minutes after intervention to recheck CBG, if CBG remains < 70, call Short Stay unit to speak with a nurse. Pt son reminded to have pt to continue to quarantine. Pt son verbalized understanding of all pre-op instructions. PA, Anesthesiology, asked to review pt history; see note.

## 2020-03-24 NOTE — Progress Notes (Signed)
PERIOPERATIVE PRESCRIPTION FOR IMPLANTED CARDIAC DEVICE PROGRAMMING  Patient Information: Name:  [name]  DOB:  Mar 25, 1930  MRN:  [id number]    Planned Procedure: RIGHT UPPER ARM ARTERIOVENOUS GRAFT PLACEMENT AND TUNNEL DIALYSIS CATHETER PLACEMENT   Surgeon: Dr. Servando Snare  Date of Procedure: 03/25/2020  Cautery will be used.  Position during surgery:   Please send documentation back to:  [facility] (Fax # [phone])   Catalina Pizza, RN  03/24/2020 11:52 AM   Device Information:  Clinic EP Physician:  Thompson Grayer, MD   Device Type:  Pacemaker Manufacturer and Phone #:  [employer]: [phone] Pacemaker Dependent?:  No. Date of Last Device Check:  02/06/20 Normal Device Function?:  Yes.    Electrophysiologist's Recommendations:   Have magnet available.  Provide continuous ECG monitoring when magnet is used or reprogramming is to be performed.   Procedure may interfere with device function.  Magnet should be placed over device during procedure.  Per Device Clinic Standing Orders, York Ram, RN  3:12 PM 03/24/2020

## 2020-03-24 NOTE — Progress Notes (Signed)
Anesthesia Chart Review: Same day workup  Follows with cardiology for history of sick sinus syndrome/secondary AV block status pacemaker ([place]).  Last remote check 02/06/2020 showed normal device function.  Battery status, lead stable.  Follows with hematology/oncology for multiple myeloma.  Per last office visit note 03/22/2020 she has not achieved remission.  She has developed mild pancytopenia on low-dose Revlimid and at that visit lenalidomide was added. She was advised to continue subcutaneous daratumumab and bortezomib every 3 weeks.  Was advised to follow-up with nephrology and cardiology regarding recent hypotension, has not seen cardiology yet.  Recent ED eval 03/21/2020 for presyncope.  Reported history of hypotension during dialysis.  EKG showed paced rhythm, no STEMI, unchanged from previous.  No critical lab values.  She had mild hypotension was given a small IV fluid bolus.  Ambulated without difficulty, without dizziness.  Patient discharged home with return precautions.  ESRD on HD T Th S.  Previous history of DM 2, last A1c 5.5 on 02/26/2019.  OSA on CPAP.  CMP and CBC from 03/22/2020 reviewed, creatinine consistent with ESRD.  WBC stable at 2.8, Hgb stable at 9.0,  Will need day of surgery labs and eval.  EKG 03/21/20: AV dual-paced rhythm. Rate 70. No significant change since Feb 2021  TTE 03/01/2019: 1. The left ventricle has normal systolic function, with an ejection  fraction of 60-65%. The cavity size was normal. Left ventricular diastolic  function could not be evaluated due to nondiagnostic images. Indeterminate  filling pressures No evidence of  left ventricular regional wall motion abnormalities.  2. The right ventricle has normal systolc function. The cavity was  normal. There is no increase in right ventricular wall thickness. Right  ventricular systolic pressure is moderately elevated with an estimated  pressure of 45.7 mmHg.  3. Left atrial size  was severely dilated.  4. The mitral valve is degenerative. Mild thickening of the anterior and  posterior mitral valve leaflets. There is mild mitral annular  calcification present.  5. Tricuspid valve regurgitation is mild-moderate.  6. The aortic valve is tricuspid Moderate sclerosis of the aortic valve.  Aortic valve regurgitation was not assessed by color flow Doppler.  7. Pulmonic valve regurgitation is mild by color flow Doppler.  8. The inferior vena cava was dilated in size with >50% respiratory  variability.   Carotid US 11/30/2015: - The vertebral arteries appear patent with antegrade flow.  - Findings consistent with 1-39 percent stenosis involving the  right internal carotid artery and the left internal carotid  artery  Nuclear stress 02/18/2014: Overall Impression:  Low risk stress nuclear study Fixed defec in the mid and basal inferior and inferolateral walls and apical lateral wall consistent with diaphragmatic attenuation.  No evidence of ischemia.+.  LV Ejection Fraction: 64%.  LV Wall Motion:  NL LV Function; NL Wall Motion   Wynonia Musty [facility] Short Stay Center/Anesthesiology Phone [phone] 03/24/2020 1:06 PM

## 2020-03-24 NOTE — Anesthesia Preprocedure Evaluation (Addendum)
Anesthesia Evaluation  Patient identified by MRN, date of birth, ID band Patient awake    Reviewed: Allergy & Precautions, NPO status , Patient's Chart, lab work & pertinent test results  Airway Mallampati: II  TM Distance: >3 FB Neck ROM: Full    Dental  (+) Dental Advisory Given, Partial Upper, Partial Lower   Pulmonary sleep apnea ,    Pulmonary exam normal breath sounds clear to auscultation       Cardiovascular hypertension, Normal cardiovascular exam+ pacemaker  Rhythm:Regular Rate:Normal     Neuro/Psych  Neuromuscular disease    GI/Hepatic Neg liver ROS, GERD  Medicated,  Endo/Other  diabetes, Type 2  Renal/GU ESRF and DialysisRenal disease     Musculoskeletal  (+) Arthritis ,   Abdominal   Peds  Hematology  (+) Blood dyscrasia (Pancytopenia), anemia , Multiple myeloma    Anesthesia Other Findings Day of surgery medications reviewed with the patient.  Reproductive/Obstetrics                           Anesthesia Physical Anesthesia Plan  ASA: III  Anesthesia Plan: MAC   Post-op Pain Management:    Induction: Intravenous  PONV Risk Score and Plan: 2 and Propofol infusion and Treatment may vary due to age or medical condition  Airway Management Planned: Nasal Cannula and Natural Airway  Additional Equipment:   Intra-op Plan:   Post-operative Plan:   Informed Consent: I have reviewed the patients History and Physical, chart, labs and discussed the procedure including the risks, benefits and alternatives for the proposed anesthesia with the patient or authorized representative who has indicated his/her understanding and acceptance.     Dental advisory given  Plan Discussed with: CRNA and Anesthesiologist  Anesthesia Plan Comments: (PAT note by Karoline Caldwell, PA-C: Follows with cardiology for history of sick sinus syndrome/secondary AV block status pacemaker ([place]).  Last remote check 02/06/2020 showed normal device function.  Battery status, lead stable.  Follows with hematology/oncology for multiple myeloma.  Per last office visit note 03/22/2020 she has not achieved remission.  She has developed mild pancytopenia on low-dose Revlimid and at that visit lenalidomide was added. She was advised to continue subcutaneous daratumumab and bortezomib every 3 weeks.  Was advised to follow-up with nephrology and cardiology regarding recent hypotension, has not seen cardiology yet.  Recent ED eval 03/21/2020 for presyncope.  Reported history of hypotension during dialysis.  EKG showed paced rhythm, no STEMI, unchanged from previous.  No critical lab values.  She had mild hypotension was given a small IV fluid bolus.  Ambulated without difficulty, without dizziness.  Patient discharged home with return precautions.  ESRD on HD T Th S.  Previous history of DM 2, last A1c 5.5 on 02/26/2019.  OSA on CPAP.  CMP and CBC from 03/22/2020 reviewed, creatinine consistent with ESRD.  WBC stable at 2.8, Hgb stable at 9.0,  Will need day of surgery labs and eval.  EKG 03/21/20: AV dual-paced rhythm. Rate 70. No significant change since Feb 2021  TTE 03/01/2019: 1. The left ventricle has normal systolic function, with an ejection  fraction of 60-65%. The cavity size was normal. Left ventricular diastolic  function could not be evaluated due to nondiagnostic images. Indeterminate  filling pressures No evidence of  left ventricular regional wall motion abnormalities.  2. The right ventricle has normal systolc function. The cavity was  normal. There is no increase in right ventricular wall thickness.  Right  ventricular systolic pressure is moderately elevated with an estimated  pressure of 45.7 mmHg.  3. Left atrial size was severely dilated.  4. The mitral valve is degenerative. Mild thickening of the anterior and  posterior mitral valve leaflets. There is mild mitral  annular  calcification present.  5. Tricuspid valve regurgitation is mild-moderate.  6. The aortic valve is tricuspid Moderate sclerosis of the aortic valve.  Aortic valve regurgitation was not assessed by color flow Doppler.  7. Pulmonic valve regurgitation is mild by color flow Doppler.  8. The inferior vena cava was dilated in size with >50% respiratory  variability.   Carotid US 11/30/2015: - The vertebral arteries appear patent with antegrade flow.  - Findings consistent with 1-39 percent stenosis involving the  right internal carotid artery and the left internal carotid  artery  Nuclear stress 02/18/2014: Overall Impression:  Low risk stress nuclear study Fixed defec in the mid and basal inferior and inferolateral walls and apical lateral wall consistent with diaphragmatic attenuation.  No evidence of ischemia.+.  LV Ejection Fraction: 64%.  LV Wall Motion:  NL LV Function; NL Wall Motion)       Anesthesia Quick Evaluation

## 2020-03-25 ENCOUNTER — Other Ambulatory Visit: Payer: Self-pay

## 2020-03-25 ENCOUNTER — Encounter (HOSPITAL_COMMUNITY)
Admission: RE | Disposition: A | Discharge status: Home / Self Care | Payer: Self-pay | Source: Home / Self Care | Attending: Vascular Surgery

## 2020-03-25 ENCOUNTER — Encounter (HOSPITAL_COMMUNITY): Payer: Self-pay | Admitting: Vascular Surgery

## 2020-03-25 ENCOUNTER — Ambulatory Visit (HOSPITAL_COMMUNITY)
Admission: RE | Admit: 2020-03-25 | Discharge: 2020-03-25 | Disposition: A | Discharge status: Home / Self Care | Payer: Medicare HMO | Attending: Vascular Surgery | Admitting: Vascular Surgery

## 2020-03-25 ENCOUNTER — Ambulatory Visit (HOSPITAL_COMMUNITY): Payer: Medicare HMO | Admitting: Physician Assistant

## 2020-03-25 ENCOUNTER — Ambulatory Visit (HOSPITAL_COMMUNITY): Payer: Medicare HMO

## 2020-03-25 DIAGNOSIS — Z8579 Personal history of other malignant neoplasms of lymphoid, hematopoietic and related tissues: Secondary | ICD-10-CM | POA: Diagnosis not present

## 2020-03-25 DIAGNOSIS — Z950 Presence of cardiac pacemaker: Secondary | ICD-10-CM | POA: Insufficient documentation

## 2020-03-25 DIAGNOSIS — Z885 Allergy status to narcotic agent status: Secondary | ICD-10-CM | POA: Diagnosis not present

## 2020-03-25 DIAGNOSIS — N186 End stage renal disease: Secondary | ICD-10-CM | POA: Diagnosis not present

## 2020-03-25 DIAGNOSIS — D649 Anemia, unspecified: Secondary | ICD-10-CM | POA: Diagnosis not present

## 2020-03-25 DIAGNOSIS — G4733 Obstructive sleep apnea (adult) (pediatric): Secondary | ICD-10-CM | POA: Diagnosis not present

## 2020-03-25 DIAGNOSIS — E1122 Type 2 diabetes mellitus with diabetic chronic kidney disease: Secondary | ICD-10-CM | POA: Insufficient documentation

## 2020-03-25 DIAGNOSIS — Y841 Kidney dialysis as the cause of abnormal reaction of the patient, or of later complication, without mention of misadventure at the time of the procedure: Secondary | ICD-10-CM | POA: Diagnosis not present

## 2020-03-25 DIAGNOSIS — Z95828 Presence of other vascular implants and grafts: Secondary | ICD-10-CM

## 2020-03-25 DIAGNOSIS — Z886 Allergy status to analgesic agent status: Secondary | ICD-10-CM | POA: Insufficient documentation

## 2020-03-25 DIAGNOSIS — Z882 Allergy status to sulfonamides status: Secondary | ICD-10-CM | POA: Diagnosis not present

## 2020-03-25 DIAGNOSIS — N185 Chronic kidney disease, stage 5: Secondary | ICD-10-CM

## 2020-03-25 DIAGNOSIS — Z992 Dependence on renal dialysis: Secondary | ICD-10-CM | POA: Diagnosis not present

## 2020-03-25 DIAGNOSIS — Z888 Allergy status to other drugs, medicaments and biological substances status: Secondary | ICD-10-CM | POA: Diagnosis not present

## 2020-03-25 DIAGNOSIS — I120 Hypertensive chronic kidney disease with stage 5 chronic kidney disease or end stage renal disease: Secondary | ICD-10-CM | POA: Diagnosis not present

## 2020-03-25 DIAGNOSIS — Z79899 Other long term (current) drug therapy: Secondary | ICD-10-CM | POA: Diagnosis not present

## 2020-03-25 DIAGNOSIS — T82858A Stenosis of vascular prosthetic devices, implants and grafts, initial encounter: Secondary | ICD-10-CM | POA: Insufficient documentation

## 2020-03-25 DIAGNOSIS — I495 Sick sinus syndrome: Secondary | ICD-10-CM | POA: Insufficient documentation

## 2020-03-25 HISTORY — PX: INSERTION OF DIALYSIS CATHETER: SHX1324

## 2020-03-25 HISTORY — DX: Presence of dental prosthetic device (complete) (partial): Z97.2

## 2020-03-25 HISTORY — DX: Gout, unspecified: M10.9

## 2020-03-25 HISTORY — DX: Gastro-esophageal reflux disease without esophagitis: K21.9

## 2020-03-25 HISTORY — DX: Other complications of anesthesia, initial encounter: T88.59XA

## 2020-03-25 HISTORY — DX: Unspecified osteoarthritis, unspecified site: M19.90

## 2020-03-25 HISTORY — DX: Presence of spectacles and contact lenses: Z97.3

## 2020-03-25 HISTORY — PX: AV FISTULA PLACEMENT: SHX1204

## 2020-03-25 LAB — POCT I-STAT, CHEM 8
BUN: 42 mg/dL — ABNORMAL HIGH (ref 8–23)
Calcium, Ion: 1.13 mmol/L — ABNORMAL LOW (ref 1.15–1.40)
Chloride: 97 mmol/L — ABNORMAL LOW (ref 98–111)
Creatinine, Ser: 4.8 mg/dL — ABNORMAL HIGH (ref 0.44–1.00)
Glucose, Bld: 82 mg/dL (ref 70–99)
HCT: 27 % — ABNORMAL LOW (ref 36.0–46.0)
Hemoglobin: 9.2 g/dL — ABNORMAL LOW (ref 12.0–15.0)
Potassium: 4.1 mmol/L (ref 3.5–5.1)
Sodium: 137 mmol/L (ref 135–145)
TCO2: 27 mmol/L (ref 22–32)

## 2020-03-25 LAB — MULTIPLE MYELOMA PANEL, SERUM
Albumin SerPl Elph-Mcnc: 3.4 g/dL (ref 2.9–4.4)
Albumin/Glob SerPl: 1.1 (ref 0.7–1.7)
Alpha 1: 0.3 g/dL (ref 0.0–0.4)
Alpha2 Glob SerPl Elph-Mcnc: 0.7 g/dL (ref 0.4–1.0)
B-Globulin SerPl Elph-Mcnc: 2 g/dL — ABNORMAL HIGH (ref 0.7–1.3)
Gamma Glob SerPl Elph-Mcnc: 0.2 g/dL — ABNORMAL LOW (ref 0.4–1.8)
Globulin, Total: 3.2 g/dL (ref 2.2–3.9)
IgA: 19 mg/dL — ABNORMAL LOW (ref 64–422)
IgG (Immunoglobin G), Serum: 1870 mg/dL — ABNORMAL HIGH (ref 586–1602)
IgM (Immunoglobulin M), Srm: 13 mg/dL — ABNORMAL LOW (ref 26–217)
M Protein SerPl Elph-Mcnc: 1.9 g/dL — ABNORMAL HIGH
Total Protein ELP: 6.6 g/dL (ref 6.0–8.5)

## 2020-03-25 LAB — GLUCOSE, CAPILLARY
Glucose-Capillary: 80 mg/dL (ref 70–99)
Glucose-Capillary: 79 mg/dL (ref 70–99)
Glucose-Capillary: 95 mg/dL (ref 70–99)

## 2020-03-25 SURGERY — INSERTION OF DIALYSIS CATHETER
Anesthesia: Monitor Anesthesia Care | Site: Chest | Laterality: Right

## 2020-03-25 MED ORDER — GLYCOPYRROLATE PF 0.2 MG/ML IJ SOSY
PREFILLED_SYRINGE | INTRAMUSCULAR | Status: AC
  Filled 2020-03-25: qty 2

## 2020-03-25 MED ORDER — FENTANYL CITRATE (PF) 100 MCG/2ML IJ SOLN
INTRAMUSCULAR | Status: DC | PRN
  Administered 2020-03-25 (×2): 25 ug via INTRAVENOUS

## 2020-03-25 MED ORDER — ORAL CARE MOUTH RINSE: 15.0000 mL | Freq: Once | OROMUCOSAL | Status: AC

## 2020-03-25 MED ORDER — ONDANSETRON HCL 4 MG/2ML IJ SOLN: 4.0000 mg | Freq: Once | INTRAMUSCULAR | Status: DC | PRN

## 2020-03-25 MED ORDER — FENTANYL CITRATE (PF) 100 MCG/2ML IJ SOLN
INTRAMUSCULAR | Status: AC
  Filled 2020-03-25: qty 2

## 2020-03-25 MED ORDER — ONDANSETRON HCL 4 MG/2ML IJ SOLN
INTRAMUSCULAR | Status: AC
  Filled 2020-03-25: qty 2

## 2020-03-25 MED ORDER — CHLORHEXIDINE GLUCONATE 4 % EX LIQD: 60.0000 mL | Freq: Once | CUTANEOUS | Status: DC

## 2020-03-25 MED ORDER — LIDOCAINE-EPINEPHRINE (PF) 1 %-1:200000 IJ SOLN
INTRAMUSCULAR | Status: DC | PRN
  Administered 2020-03-25: 24 mL via INTRADERMAL

## 2020-03-25 MED ORDER — TRAMADOL HCL 50 MG PO TABS: 50.0000 mg | ORAL_TABLET | Freq: Three times a day (TID) | ORAL | 0 refills | Status: DC | PRN

## 2020-03-25 MED ORDER — SODIUM CHLORIDE 0.9 % IV SOLN
INTRAVENOUS | Status: DC
  Administered 2020-03-25: 10 mL/h via INTRAVENOUS

## 2020-03-25 MED ORDER — FENTANYL CITRATE (PF) 100 MCG/2ML IJ SOLN
25.0000 ug | INTRAMUSCULAR | Status: DC | PRN
  Administered 2020-03-25 (×2): 25 ug via INTRAVENOUS

## 2020-03-25 MED ORDER — ACETAMINOPHEN 500 MG PO TABS
1000.0000 mg | ORAL_TABLET | Freq: Once | ORAL | Status: AC
  Administered 2020-03-25: 500 mg via ORAL
  Filled 2020-03-25: qty 2

## 2020-03-25 MED ORDER — HEPARIN SODIUM (PORCINE) 1000 UNIT/ML IJ SOLN
INTRAMUSCULAR | Status: AC
  Filled 2020-03-25: qty 1

## 2020-03-25 MED ORDER — VANCOMYCIN HCL IN DEXTROSE 1-5 GM/200ML-% IV SOLN
1000.0000 mg | INTRAVENOUS | Status: AC
  Administered 2020-03-25: 1000 mg via INTRAVENOUS
  Filled 2020-03-25: qty 200

## 2020-03-25 MED ORDER — PHENYLEPHRINE 40 MCG/ML (10ML) SYRINGE FOR IV PUSH (FOR BLOOD PRESSURE SUPPORT)
PREFILLED_SYRINGE | INTRAVENOUS | Status: DC | PRN
  Administered 2020-03-25: 80 ug via INTRAVENOUS
  Administered 2020-03-25 (×3): 60 ug via INTRAVENOUS

## 2020-03-25 MED ORDER — LIDOCAINE-EPINEPHRINE (PF) 1 %-1:200000 IJ SOLN
INTRAMUSCULAR | Status: AC
  Filled 2020-03-25: qty 30

## 2020-03-25 MED ORDER — CHLORHEXIDINE GLUCONATE 0.12 % MT SOLN
OROMUCOSAL | Status: AC
  Administered 2020-03-25: 15 mL via OROMUCOSAL
  Filled 2020-03-25: qty 15

## 2020-03-25 MED ORDER — 0.9 % SODIUM CHLORIDE (POUR BTL) OPTIME
TOPICAL | Status: DC | PRN
  Administered 2020-03-25: 1000 mL

## 2020-03-25 MED ORDER — PROPOFOL 500 MG/50ML IV EMUL
INTRAVENOUS | Status: DC | PRN
  Administered 2020-03-25: 50 ug/kg/min via INTRAVENOUS

## 2020-03-25 MED ORDER — SODIUM CHLORIDE 0.9 % IV SOLN
INTRAVENOUS | Status: DC | PRN
  Administered 2020-03-25: 500 mL

## 2020-03-25 MED ORDER — SODIUM CHLORIDE 0.9 % IV SOLN
INTRAVENOUS | Status: AC
  Filled 2020-03-25: qty 1.2

## 2020-03-25 MED ORDER — CHLORHEXIDINE GLUCONATE 0.12 % MT SOLN: 15.0000 mL | Freq: Once | OROMUCOSAL | Status: AC

## 2020-03-25 MED ORDER — HEPARIN SODIUM (PORCINE) 1000 UNIT/ML IJ SOLN
INTRAMUSCULAR | Status: DC | PRN
  Administered 2020-03-25: 3200 [IU]

## 2020-03-25 MED ORDER — FENTANYL CITRATE (PF) 250 MCG/5ML IJ SOLN
INTRAMUSCULAR | Status: AC
  Filled 2020-03-25: qty 5

## 2020-03-25 MED ORDER — PHENYLEPHRINE 40 MCG/ML (10ML) SYRINGE FOR IV PUSH (FOR BLOOD PRESSURE SUPPORT)
PREFILLED_SYRINGE | INTRAVENOUS | Status: AC
  Filled 2020-03-25: qty 10

## 2020-03-25 MED ORDER — LIDOCAINE 2% (20 MG/ML) 5 ML SYRINGE
INTRAMUSCULAR | Status: AC
  Filled 2020-03-25: qty 10

## 2020-03-25 SURGICAL SUPPLY — 57 items
ADH SKN CLS APL DERMABOND .7 (GAUZE/BANDAGES/DRESSINGS) ×3
ARMBAND PINK RESTRICT EXTREMIT (MISCELLANEOUS) ×10 IMPLANT
BAG DECANTER FOR FLEXI CONT (MISCELLANEOUS) ×5 IMPLANT
BIOPATCH RED 1 DISK 7.0 (GAUZE/BANDAGES/DRESSINGS) ×4 IMPLANT
BIOPATCH RED 1IN DISK 7.0MM (GAUZE/BANDAGES/DRESSINGS) ×1
CANISTER SUCT 3000ML PPV (MISCELLANEOUS) ×5 IMPLANT
CATH PALINDROME-P 19CM W/VT (CATHETERS) ×5 IMPLANT
CATH PALINDROME-P 23CM W/VT (CATHETERS) IMPLANT
CATH PALINDROME-P 28CM W/VT (CATHETERS) IMPLANT
CLIP VESOCCLUDE MED 6/CT (CLIP) ×5 IMPLANT
CLIP VESOCCLUDE SM WIDE 6/CT (CLIP) ×5 IMPLANT
COVER PROBE W GEL 5X96 (DRAPES) ×5 IMPLANT
COVER SURGICAL LIGHT HANDLE (MISCELLANEOUS) ×5 IMPLANT
COVER WAND RF STERILE (DRAPES) ×5 IMPLANT
DERMABOND ADVANCED (GAUZE/BANDAGES/DRESSINGS) ×2
DERMABOND ADVANCED .7 DNX12 (GAUZE/BANDAGES/DRESSINGS) ×3 IMPLANT
DRAPE C-ARM 42X72 X-RAY (DRAPES) ×5 IMPLANT
DRAPE CHEST BREAST 15X10 FENES (DRAPES) ×5 IMPLANT
ELECT REM PT RETURN 9FT ADLT (ELECTROSURGICAL) ×5
ELECTRODE REM PT RTRN 9FT ADLT (ELECTROSURGICAL) ×3 IMPLANT
GAUZE 4X4 16PLY RFD (DISPOSABLE) ×5 IMPLANT
GLOVE BIO SURGEON STRL SZ7.5 (GLOVE) ×5 IMPLANT
GLOVE BIOGEL PI IND STRL 6.5 (GLOVE) ×3 IMPLANT
GLOVE BIOGEL PI INDICATOR 6.5 (GLOVE) ×2
GOWN STRL REUS W/ TWL LRG LVL3 (GOWN DISPOSABLE) ×6 IMPLANT
GOWN STRL REUS W/ TWL XL LVL3 (GOWN DISPOSABLE) ×3 IMPLANT
GOWN STRL REUS W/TWL LRG LVL3 (GOWN DISPOSABLE) ×10
GOWN STRL REUS W/TWL XL LVL3 (GOWN DISPOSABLE) ×5
HEMOSTAT SNOW SURGICEL 2X4 (HEMOSTASIS) IMPLANT
INSERT FOGARTY SM (MISCELLANEOUS) ×5 IMPLANT
KIT BASIN OR (CUSTOM PROCEDURE TRAY) ×5 IMPLANT
KIT PALINDROME-P 55CM (CATHETERS) IMPLANT
KIT TURNOVER KIT B (KITS) ×5 IMPLANT
NEEDLE 18GX1X1/2 (RX/OR ONLY) (NEEDLE) ×5 IMPLANT
NEEDLE HYPO 25GX1X1/2 BEV (NEEDLE) ×5 IMPLANT
NS IRRIG 1000ML POUR BTL (IV SOLUTION) ×5 IMPLANT
PACK CV ACCESS (CUSTOM PROCEDURE TRAY) ×5 IMPLANT
PACK SURGICAL SETUP 50X90 (CUSTOM PROCEDURE TRAY) ×5 IMPLANT
PAD ARMBOARD 7.5X6 YLW CONV (MISCELLANEOUS) ×10 IMPLANT
SET MICROPUNCTURE 5F STIFF (MISCELLANEOUS) ×5 IMPLANT
SOAP 2 % CHG 4 OZ (WOUND CARE) ×5 IMPLANT
SUT ETHILON 3 0 PS 1 (SUTURE) ×5 IMPLANT
SUT MNCRL AB 4-0 PS2 18 (SUTURE) ×5 IMPLANT
SUT PROLENE 6 0 BV (SUTURE) IMPLANT
SUT SILK 2 0 SH (SUTURE) IMPLANT
SUT VIC AB 3-0 SH 27 (SUTURE) ×10
SUT VIC AB 3-0 SH 27X BRD (SUTURE) ×6 IMPLANT
SYR 10ML LL (SYRINGE) ×5 IMPLANT
SYR 20ML LL LF (SYRINGE) ×10 IMPLANT
SYR 5ML LL (SYRINGE) ×5 IMPLANT
SYR CONTROL 10ML LL (SYRINGE) ×5 IMPLANT
SYR TOOMEY 50ML (SYRINGE) IMPLANT
TOWEL GREEN STERILE (TOWEL DISPOSABLE) ×5 IMPLANT
TOWEL GREEN STERILE FF (TOWEL DISPOSABLE) ×10 IMPLANT
UNDERPAD 30X36 HEAVY ABSORB (UNDERPADS AND DIAPERS) ×5 IMPLANT
WATER STERILE IRR 1000ML POUR (IV SOLUTION) ×5 IMPLANT
WIRE TORQFLEX AUST .018X40CM (WIRE) ×5 IMPLANT

## 2020-03-25 NOTE — H&P (Signed)
HPI: This is a 86 y.o. female here for evaluation of her hemodialysis access.  She underwent a right RC AVF and superficialization of her fistula on 03/11/2019 by Dr. Scot Dock.  She has a left sided PPM.   She comes in today for evaluation of her fistula.  She states that her fistula worked pretty good yesterday, but sometimes, it doesn't run well and the machine shuts down.  She states that she had some bleeding from the stick site in the forearm area this morning, but his has stopped this afternoon.  She denies any pain in her right hand.  She states she does have some numbness in both hands.   The pt is right hand dominant.    Pt is on dialysis.   Days of dialysis if applicable:  T/T/S    HD center if applicable:  [place] location.   The pt is not on a statin for cholesterol management.  The pt is not on a daily aspirin.  Other AC:  none The pt is not on meds for hypertension.  The pt is diabetic.   Tobacco hx:  never      Past Medical History:  Diagnosis Date  . Anemia   . Benign paroxysmal positional vertigo 04/06/2010  . Benign positional vertigo   . 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 CAROTID BRUIT 02/24/2008  . DIABETES MELLITUS, TYPE II 02/24/2008  . ESRD (end stage renal disease) on dialysis ([place])    tues thurs sat nw kidney center sees [place] [facility]  . Glaucoma   . HYPERTENSION 02/24/2008  . Multiple myeloma ([place])   . Obstructive sleep apnea   . Pancytopenia ([place])   . Sick sinus syndrome [facility])          Past Surgical History:  Procedure Laterality Date  . ABDOMINAL HYSTERECTOMY     1980's  . AV FISTULA PLACEMENT Right 03/11/2019   Procedure: CREATION RIGHT ARM RADIOCEPHALIC ARTERIOVENOUS FISTULA;  Surgeon: Angelia Mould, MD;  Location: [place];  Service: Vascular;  Laterality: Right;  . BACK SURGERY     x 3  . BIOPSY  06/24/2019   Procedure: BIOPSY;  Surgeon: Ronald Lobo, MD;  Location: [facility];  Service:  Endoscopy;;  . EP IMPLANTABLE DEVICE N/A 05/16/2016   Procedure: PPM Generator Changeout;  Surgeon: Thompson Grayer, MD;  Location: [place] [facility];  Service: Cardiovascular;  Laterality: N/A;  . ESOPHAGOGASTRODUODENOSCOPY (EGD) WITH PROPOFOL N/A 06/24/2019   Procedure: ESOPHAGOGASTRODUODENOSCOPY (EGD) WITH PROPOFOL;  Surgeon: Ronald Lobo, MD;  Location: [facility];  Service: Endoscopy;  Laterality: N/A;  . FISTULA SUPERFICIALIZATION Right 03/11/2019   Procedure: FISTULA SUPERFICIALIZATION;  Surgeon: Angelia Mould, MD;  Location: [place];  Service: Vascular;  Laterality: Right;  . IR FLUORO GUIDE CV LINE RIGHT  03/07/2019  . IR US GUIDE VASC ACCESS RIGHT  03/07/2019  . KNEE SURGERY Right    2003  . KNEE SURGERY Left    2011  . PACEMAKER INSERTION           Allergies  Allergen Reactions  . Aspirin Other (See Comments)    REACTION: STOMACH ISSUES WITH DOSE HIGHER THAN 81 MG  Other reaction(s): GI Upset (intolerance), Other (See Comments) REACTION: STOMACH ISSUES WITH DOSE HIGHER THAN 81 MG  REACTION: STOMACH ISSUES WITH DOSE HIGHER THAN 81 MG   . Hydrocodone-Acetaminophen Nausea And Vomiting  . Morphine Nausea And Vomiting  . Penicillins Rash    Patient took injection  and tablets, had a reaction. She has taken amoxicillin with no reaction Has patient had a PCN reaction causing immediate rash, facial/tongue/throat swelling, SOB or lightheadedness with hypotension: Yes Has patient had a PCN reaction causing severe rash involving mucus membranes or skin necrosis: Yes Has patient had a PCN reaction that required hospitalization No Has patient had a PCN reaction occurring within the last 10 years: No If all of the above answers are "NO", then m Patient took injection and tablets, had a reaction. She has taken amoxicillin with no reaction Patient took injection and tablets, had a reaction. She has taken amoxicillin with no reaction Has patient had a PCN reaction  causing immediate rash, facial/tongue/throat swelling, SOB or lightheadedness with hypotension: Yes Has patient had a PCN reaction causing severe rash involving mucus membranes or skin necrosis: Yes Has patient had a PCN reaction that required hospitalization No Has patient had a PCN reaction occurring within the last 10 years: No If all of the above answers are "NO", then m  . Betaine Nausea And Vomiting  . Dacarbazine Nausea And Vomiting  . Ketorolac Tromethamine Itching  . Brimonidine Itching  . Diltiazem Hcl Rash  . Hydrocodone-Acetaminophen Nausea And Vomiting  . Neurontin [Gabapentin] Nausea And Vomiting  . Sulfacetamide Sodium Rash          Current Outpatient Medications  Medication Sig Dispense Refill  . acetaminophen (TYLENOL) 500 MG tablet Take 500 mg by mouth 2 (two) times daily as needed for moderate pain.    Marland Kitchen acyclovir (ZOVIRAX) 400 MG tablet TAKE 1 TABLET BY MOUTH EVERY DAY 90 tablet 2  . allopurinol (ZYLOPRIM) 100 MG tablet Take 0.5 tablets (50 mg total) by mouth daily. 30 tablet 0  . B Complex-C-Zn-Folic Acid (DIALYVITE [id number]) 0.8 MG TABS Take 1 tablet by mouth every evening.     . cholecalciferol (VITAMIN D3) 25 MCG (1000 UT) tablet Take 2,000 Units by mouth daily.    . dorzolamide (TRUSOPT) 2 % ophthalmic solution Place 1 drop into the left eye every evening.   1  . ethyl chloride spray Apply 1 application topically as needed (prior to port being accessed).     . fluorometholone (FML) 0.1 % ophthalmic suspension Place 1 drop into the right eye 3 (three) times daily.     . isosorbide-hydrALAZINE (BIDIL) 20-37.5 MG tablet Take 1 tablet by mouth 2 (two) times a day. 60 tablet 0  . lactulose (CHRONULAC) 10 GM/15ML solution TAKE 15 MLS (10 G TOTAL) BY MOUTH 2 (TWO) TIMES DAILY. 473 mL 0  . lenalidomide (REVLIMID) 2.5 MG capsule Take 1 capsule three times a week after dialysis on Tuesday, Thursday and Saturdays 12 capsule 0  . LUMIGAN 0.01 % SOLN Place 1  drop into the left eye every evening.     Marland Kitchen LYRICA 50 MG capsule Take 50 mg by mouth at bedtime as needed (pain/neuropathy).     . ondansetron (ZOFRAN) 4 MG tablet Take 1 tablet (4 mg total) by mouth every 8 (eight) hours as needed for nausea. 30 tablet 1  . pantoprazole (PROTONIX) 40 MG tablet Take 1 tablet (40 mg total) by mouth daily as needed (acid reflux/indigestion.). 30 tablet 11  . polyethylene glycol (MIRALAX / GLYCOLAX) 17 g packet Take 17 g by mouth daily as needed (constipation).    . prochlorperazine (COMPAZINE) 5 MG tablet Take 1 tablet (5 mg total) by mouth every 6 (six) hours as needed for nausea or vomiting. 30 tablet 1  . senna-docusate (  SENOKOT-S) 8.6-50 MG tablet Take 2 tablets by mouth 2 (two) times daily. (Patient taking differently: Take 2 tablets by mouth 2 (two) times daily as needed (constipation.). ) 30 tablet 0  . sevelamer carbonate (RENVELA) 800 MG tablet Take 800-1,200 mg by mouth daily.     . sodium chloride (OCEAN) 0.65 % SOLN nasal spray Place 1 spray into both nostrils 4 (four) times daily as needed for congestion.      No current facility-administered medications for this visit.         Family History  Problem Relation Age of Onset  . Congestive Heart Failure Mother   . Tuberculosis Mother   . Multiple myeloma Mother   . Bone cancer Father   . Arthritis Father   . Breast cancer Sister   . Colon cancer Sister   . Hypertension Sister   . Dementia Sister   . Alcohol abuse Son     Social History        Socioeconomic History  . Marital status: Married    Spouse name: Not on file  . Number of children: 5  . Years of education: Not on file  . Highest education level: Not on file  Occupational History  . Occupation: retired  Tobacco Use  . Smoking status: Never Smoker  . Smokeless tobacco: Never Used  Substance and Sexual Activity  . Alcohol use: No  . Drug use: No  . Sexual activity: Not on file  Other Topics  Concern  . Not on file  Social History Narrative   Retired Marine scientist   Social Determinants of Scientist, physiological Strain:   . Difficulty of Paying Living Expenses:   Food Insecurity:   . Worried About Charity fundraiser in the Last Year:   . Arboriculturist in the Last Year:   Transportation Needs:   . Film/video editor (Medical):   Marland Kitchen Lack of Transportation (Non-Medical):   Physical Activity:   . Days of Exercise per Week:   . Minutes of Exercise per Session:   Stress:   . Feeling of Stress :   Social Connections:   . Frequency of Communication with Friends and Family:   . Frequency of Social Gatherings with Friends and Family:   . Attends Religious Services:   . Active Member of Clubs or Organizations:   . Attends Archivist Meetings:   Marland Kitchen Marital Status:   Intimate Partner Violence:   . Fear of Current or Ex-Partner:   . Emotionally Abused:   Marland Kitchen Physically Abused:   . Sexually Abused:      ROS: [x]  Positive   [ ]  Negative   [ ]  All sytems reviewed and are negative  Cardiac: [x]  PPM (left chest) [x]  shortness of breath with activity  Vascular: []  pain in legs while walking []  pain in feet when lying flat []  hx of DVT []  swelling in legs  Pulmonary: []  asthma []  wheezing  Neurologic: []  weakness in []  arms []  legs []  numbness in []  arms []  legs [] difficulty speaking or slurred speech []  temporary loss of vision in one eye []  dizziness  Hematologic: [x]  multiple myeloma  GI []  GERD  GU: [x]  CKD/renal failure  [x]  HD---[]  M/W/F [x]  T/T/S []  burning with urination []  blood in urine  Psychiatric: []  hx of major depression  Integumentary: []  rashes []  ulcers  Constitutional: []  fever []  chills  PHYSICAL EXAMINATION:  Vitals:   03/25/20 0910  BP: (!) 147/62  Pulse: 70  Resp: 18  Temp: 97.7 F (36.5 C)  SpO2: 100%      General:  WDWN female in NAD Gait: Not observed HENT: WNL Pulmonary:  normal non-labored breathing , without Rales, rhonchi,  wheezing Cardiac: regular, without  Murmurs Abdomen: soft, NT, no masses Skin: without rashes, without ulcers  Vascular Exam/Pulses:   Right Left  Radial 2+ (normal) 2+ (normal)  Ulnar Unable to palpate  Unable to palpate    Extremities:  without ischemic changes, without Gangrene, without cellulitis; without open wounds; there is a stronger thrill/bruit in the distal portion of the fistula in the forearm, but weakens proximally.   Musculoskeletal: no muscle wasting or atrophy       Neurologic: A&O X 3; Moving all extremities equally;  Speech is fluent/normal  Non-Invasive Vascular Imaging:   Dialysis duplex on 02-27-2020: +------------+----------+-------------+----------+------------------+  OUTFLOW VEINPSV (cm/s)Diameter (cm)Depth (cm)   Describe     +------------+----------+-------------+----------+------------------+  Prox UA     17    0.62     0.27             +------------+----------+-------------+----------+------------------+  Mid UA     24    0.61     0.21             +------------+----------+-------------+----------+------------------+  Dist UA     23    0.61     0.20   competing branch   +------------+----------+-------------+----------+------------------+  AC Fossa    30    0.91     0.15  change in Diameter  +------------+----------+-------------+----------+------------------+  Prox Forearm  517    0.21     0.56     stenotic     +------------+----------+-------------+----------+------------------+     Summary:  Patent arteriovenous fistula.  Arteriovenous fistula-Velocities less than 100cm/s noted in the  antecubital  fossa through the proximal upper arm.  Arteriovenous fistula-Stenosis noted in the proximal forearm.   Carotid duplex 11/30/2015 1-39% bilateral carotid  stenosis   ASSESSMENT/PLAN: 86 y.o. female with ESRD here for evaluation of her hemodialysis access and underwent right RC AVF and superficialization of her fistula on 03/11/2019 by Dr. Scot Dock.  She is having difficulty with running on dialysis.  We have performed fistulogram we will plan for conversion to upper arm fistula versus graft in the OR today and also place catheter to allow the fistula to heal.  Dean Wonder C. Donzetta Matters, MD [facility] Office: [phone] Pager: [phone]

## 2020-03-25 NOTE — Transfer of Care (Signed)
Immediate Anesthesia Transfer of Care Note  Patient: [name]  Procedure(s) Performed: INSERTION OF DIALYSIS CATHETER, right internal jugular (Right Chest) right arm ARTERIOVENOUS (AV) FISTULA CREATION (Right Arm Lower)  Patient Location: PACU  Anesthesia Type:MAC  Level of Consciousness: awake, alert  and oriented  Airway & Oxygen Therapy: Patient Spontanous Breathing and Patient connected to face mask oxygen  Post-op Assessment: Report given to RN, Post -op Vital signs reviewed and stable and Patient moving all extremities X 4  Post vital signs: Reviewed and stable  Last Vitals:  Vitals Value Taken Time  BP    Temp    Pulse 72 03/25/20 1251  Resp 13 03/25/20 1251  SpO2 100 % 03/25/20 1251  Vitals shown include unvalidated device data.  Last Pain:  Vitals:   03/25/20 0945  TempSrc:   PainSc: 0-No pain      Patients Stated Pain Goal: 5 (01/60/10 9323)  Complications: No complications documented.

## 2020-03-25 NOTE — Anesthesia Procedure Notes (Signed)
Procedure Name: MAC Date/Time: 03/25/2020 11:33 AM Performed by: Candis Shine, CRNA Pre-anesthesia Checklist: Patient identified, Suction available, Patient being monitored, Timeout performed and Emergency Drugs available Patient Re-evaluated:Patient Re-evaluated prior to induction Oxygen Delivery Method: Simple face mask and Nasal cannula

## 2020-03-25 NOTE — Op Note (Signed)
Patient name: [name] MRN: [id number] DOB: Oct 04, 1930 Sex: female  03/25/2020 Pre-operative Diagnosis: esrd Post-operative diagnosis:  Same Surgeon:  Erlene Quan C. Donzetta Matters, MD Assistant: Leontine Locket, PA Procedure Performed: 1.  Placement of right innominate vein 19 cm tunneled dialysis catheter with ultrasound and fluoroscopic guidance 2.  Revision of right arm AV fistula with conversion to brachial artery to cephalic vein fistula  Indications: 86 year old female has a history of end-stage renal disease she currently dialyzes via a forearm fistula on the right.  She has had difficulty with dialyzing we performed fistulogram and decided to convert her to an upper arm access in would need placement of catheter in the interim.  Findings: Patient's right IJ was obliterated.  We able to cannulate very small residual vein with micropuncture needle but a wire would not pass.  She does not appear to have suitable veins on the left side with either ultrasound or due to the location of her pacemaker.  I was able to see under her clavicle to the innominate vein right at the subclavian and IJ junction.  I was able to cannulate this with ultrasound guidance under the clavicle and placed a 19 cm catheter in the right atrium.  On the right upper extremity the cephalic vein was easily visible.  This was actually quite thickened although somewhat tortuous in the upper arm.  This was sewn to the brachial artery ligating the previous connection to the cephalic vein and fistula.  Previous fistula still has runoff via the basilic vein.  Fistula will need at least 6 weeks possibly longer for maturation but was significantly thickened on evaluation.   Procedure:  The patient was identified in the holding area and taken to the operating room where she was fully supine on the operative table and MAC anesthesia induced.  She was sterilely prepped and draped in the bilateral neck chest and right upper extremity in the  usual fashion and antibiotics were administered.  Timeout was called.  We began using ultrasound guidance to evaluate her right IJ vein.  This was clearly obliterated.  There is anesthetized 1% lidocaine.  I first used an 18-gauge needle attempted to cannulate this but could not.  I then used a micropuncture needle and was able to get it into the obliterated vein by ultrasound but could not get a wire to pass.  I then used ultrasound identify the left internal jugular vein I could see the wires from the pacemaker elected not to cannulate this.  I was able to identify the subclavian vein under the clavicle right where it joined the IJ.  Using a micropuncture needle I was able to cannulate the innominate vein with direct ultrasound visualization.  I passed a wire centrally under fluoroscopy.  I then placed a micropuncture sheath followed by a J-wire into the right ventricle.  The wire tract was serially dilated introducer sheath was placed under fluoroscopic guidance.  A counterincision was made in 19 cm catheter was tunneled.  This was then placed into the right atrium.  It was then affixed to the skin with 3-0 nylon suture and the neck incision was closed with 4 Monocryl.  Dermabond placed at both sites.  Attention was then turned to the right upper extremity.  I anesthetized the area just below the antecubitum and a transverse incision was made.  I dissected out the vein dividing branches between ties.  There is a direct connection of the previous fistula in the cephalic vein and this appeared  to be suitable for fistula creation.  I marked it for orientation.  I dissected to the deep fascia to the artery and placed a vessel loop around this.  The vein was then transected distally and tied off.  I flushed with heparinized saline and spatulated and clamped it.  I clamped the artery distally and proximally opened longitudinally and flushed with heparinized saline both directions.  The vein was then sewn end-to-side  with 6-0 Prolene suture.  Prior to completion we allowed flushing all directions.  Upon completion there is a very strong thrill in the upper arm vein and a palpable radial artery pulse the wrist both confirmed with Doppler.  Satisfied we irrigated the wound obtain hemostasis and closed in layers with Vicryl and Monocryl.  Dermabond is placed at the skin.  She was awakened anesthesia having tolerated procedure well any complication.  All counts were correct at completion.  EBL: 20 cc   Hayla Hinger C. Donzetta Matters, MD [facility] Office: [phone] Pager: [phone]

## 2020-03-25 NOTE — Progress Notes (Signed)
   Patient was initially having shoulder discomfort and dysfunction of her right hand.  After a few hours of monitoring it appears her hand has regained function which was likely secondary to significant amounts of lidocaine use during the procedure.  I discussed with both patient and her son we will let her go home this evening and she is to call the office if there are further issues with her hand.  [name] Tiano C. Donzetta Matters, MD

## 2020-03-25 NOTE — Anesthesia Postprocedure Evaluation (Signed)
Anesthesia Post Note  Patient: [name]  Procedure(s) Performed: INSERTION OF DIALYSIS CATHETER, right internal jugular (Right Chest) right arm ARTERIOVENOUS (AV) FISTULA CREATION (Right Arm Lower)     Patient location during evaluation: PACU Anesthesia Type: MAC Level of consciousness: awake and alert, awake and oriented Pain management: pain level controlled Vital Signs Assessment: post-procedure vital signs reviewed and stable Respiratory status: spontaneous breathing, nonlabored ventilation, respiratory function stable and patient connected to nasal cannula oxygen Cardiovascular status: stable and blood pressure returned to baseline Postop Assessment: no apparent nausea or vomiting Anesthetic complications: no   No complications documented.  Last Vitals:  Vitals:   03/25/20 1545 03/25/20 1600  BP: (!) 131/57 (!) 117/46  Pulse: 70 70  Resp: (!) 8 11  Temp:    SpO2: 100% 100%    Last Pain:  Vitals:   03/25/20 1600  TempSrc:   PainSc: Asleep                 Catalina Gravel

## 2020-03-25 NOTE — Discharge Instructions (Signed)
[facility]  Discharge Instructions  AV Fistula or Graft Surgery for Dialysis Access  Please refer to the following instructions for your post-procedure care. Your surgeon or physician assistant will discuss any changes with you.  Activity  You may drive the day following your surgery, if you are comfortable and no longer taking prescription pain medication. Resume full activity as the soreness in your incision resolves.  Bathing/Showering  You may shower after you go home. Keep your incision dry for 48 hours. Do not soak in a bathtub, hot tub, or swim until the incision heals completely. You may not shower if you have a hemodialysis catheter.  Incision Care  Clean your incision with mild soap and water after 48 hours. Pat the area dry with a clean towel. You do not need a bandage unless otherwise instructed. Do not apply any ointments or creams to your incision. You may have skin glue on your incision. Do not peel it off. It will come off on its own in about one week. Your arm may swell a bit after surgery. To reduce swelling use pillows to elevate your arm so it is above your heart. Your doctor will tell you if you need to lightly wrap your arm with an ACE bandage.  Diet  Resume your normal diet. There are not special food restrictions following this procedure. In order to heal from your surgery, it is CRITICAL to get adequate nutrition. Your body requires vitamins, minerals, and protein. Vegetables are the best source of vitamins and minerals. Vegetables also provide the perfect balance of protein. Processed food has little nutritional value, so try to avoid this.  Medications  Resume taking all of your medications. If your incision is causing pain, you may take over-the counter pain relievers such as acetaminophen (Tylenol). If you were prescribed a stronger pain medication, please be aware these medications can cause nausea and constipation. Prevent  nausea by taking the medication with a snack or meal. Avoid constipation by drinking plenty of fluids and eating foods with high amount of fiber, such as fruits, vegetables, and grains.  Do not take Tylenol if you are taking prescription pain medications.  Follow up Your surgeon may want to see you in the office following your access surgery. If so, this will be arranged at the time of your surgery.  Please call us immediately for any of the following conditions:  . Increased pain, redness, drainage (pus) from your incision site . Fever of 101 degrees or higher . Severe or worsening pain at your incision site . Hand pain or numbness. .  Reduce your risk of vascular disease:  . Stop smoking. If you would like help, call [facility] at 1-800-QUIT-NOW 854 664 7238) or [place] at [phone]  . Manage your cholesterol . Maintain a desired weight . Control your diabetes . Keep your blood pressure down  Dialysis  It will take several weeks to several months for your new dialysis access to be ready for use. Your surgeon will determine when it is okay to use it. Your nephrologist will continue to direct your dialysis. You can continue to use your Permcath until your new access is ready for use.   03/25/2020 [name] [id number] [birth year]  Surgeon(s): Waynetta Sandy, MD  Procedure(s): Insertion of tunneled dialysis catheter and creation of right brachial cephalic AV fistula  x Do not stick fistula for 12 weeks    If you have any questions, please call the office at  [phone].   

## 2020-03-26 ENCOUNTER — Encounter (HOSPITAL_COMMUNITY): Payer: Self-pay | Admitting: Vascular Surgery

## 2020-03-29 ENCOUNTER — Telehealth: Payer: Self-pay

## 2020-03-29 ENCOUNTER — Other Ambulatory Visit: Payer: Self-pay

## 2020-03-29 ENCOUNTER — Encounter: Payer: Self-pay | Admitting: Student

## 2020-03-29 ENCOUNTER — Ambulatory Visit: Payer: Medicare HMO | Admitting: Student

## 2020-03-29 VITALS — BP 110/58 | HR 74 | Ht 65.0 in | Wt 227.2 lb

## 2020-03-29 DIAGNOSIS — N186 End stage renal disease: Secondary | ICD-10-CM | POA: Diagnosis not present

## 2020-03-29 DIAGNOSIS — G4733 Obstructive sleep apnea (adult) (pediatric): Secondary | ICD-10-CM

## 2020-03-29 DIAGNOSIS — I495 Sick sinus syndrome: Secondary | ICD-10-CM | POA: Diagnosis not present

## 2020-03-29 DIAGNOSIS — I471 Supraventricular tachycardia: Secondary | ICD-10-CM

## 2020-03-29 DIAGNOSIS — Z992 Dependence on renal dialysis: Secondary | ICD-10-CM

## 2020-03-29 LAB — CUP PACEART INCLINIC DEVICE CHECK
Battery Remaining Longevity: 81 mo
Battery Voltage: 2.96 V
Brady Statistic RA Percent Paced: 81 %
Brady Statistic RV Percent Paced: 88 %
Date Time Interrogation Session: 20210614132550
Implantable Lead Implant Date: 20100422
Implantable Lead Implant Date: 20100422
Implantable Lead Location: 753859
Implantable Lead Location: 753860
Implantable Pulse Generator Implant Date: 20170801
Lead Channel Impedance Value: 375 Ohm
Lead Channel Impedance Value: 387.5 Ohm
Lead Channel Pacing Threshold Amplitude: 0.75 V
Lead Channel Pacing Threshold Amplitude: 0.75 V
Lead Channel Pacing Threshold Amplitude: 1 V
Lead Channel Pacing Threshold Amplitude: 1 V
Lead Channel Pacing Threshold Pulse Width: 0.5 ms
Lead Channel Pacing Threshold Pulse Width: 0.5 ms
Lead Channel Pacing Threshold Pulse Width: 0.5 ms
Lead Channel Pacing Threshold Pulse Width: 0.5 ms
Lead Channel Sensing Intrinsic Amplitude: 2.6 mV
Lead Channel Sensing Intrinsic Amplitude: 7.7 mV
Lead Channel Setting Pacing Amplitude: 2 V
Lead Channel Setting Pacing Amplitude: 2.5 V
Lead Channel Setting Pacing Pulse Width: 0.5 ms
Lead Channel Setting Sensing Sensitivity: 2 mV
Pulse Gen Model: 2272
Pulse Gen Serial Number: 7929552

## 2020-03-29 NOTE — Patient Instructions (Addendum)
Medication Instructions:  none *If you need a refill on your cardiac medications before your next appointment, please call your pharmacy*   Lab Work: none If you have labs (blood work) drawn today and your tests are completely normal, you will receive your results only by: Marland Kitchen MyChart Message (if you have MyChart) OR . A paper copy in the mail If you have any lab test that is abnormal or we need to change your treatment, we will call you to review the results.   Testing/Procedures: none   Follow-Up: At [facility], you and your health needs are our priority.  As part of our continuing mission to provide you with exceptional heart care, we have created designated Provider Care Teams.  These Care Teams include your primary Cardiologist (physician) and Advanced Practice Providers (APPs -  Physician Assistants and Nurse Practitioners) who all work together to provide you with the care you need, when you need it.  Your next appointment:   1 year  The format for your next appointment:   Either In Person or Virtual  Provider:   Dr Rayann Heman    Other Instructions Remote monitoring is used to monitor your Pacemaker from home. This monitoring reduces the number of office visits required to check your device to one time per year. It allows [place] to keep an eye on the functioning of your device to ensure it is working properly. You are scheduled for a device check from home on 05/07/20. You may send your transmission at any time that day. If you have a wireless device, the transmission will be sent automatically. After your physician reviews your transmission, you will receive a postcard with your next transmission date.

## 2020-03-29 NOTE — Progress Notes (Signed)
Electrophysiology Office Note Date: 03/29/2020  ID:  [name], DOB 1930-06-12, MRN [id number]  PCP: Lucianne Lei, MD Primary Cardiologist: No primary care provider on file. Electrophysiologist: Thompson Grayer, MD   CC: Pacemaker follow-up  [name] is a 86 y.o. female seen today for Thompson Grayer, MD for acute visit due to intermittent hypotension.  Since last being seen in our clinic the patient reports doing well overall. She had an episode of hypotension 6/6 that resulted in syncope, but without injury. She was seen by EMS that day. She was seen by her Oncologist the next day and Bidil has been held. Her BP has remained low in the 100-110s. No further syncope. She denies SOB or chest pain. Her BP remains low on HD days (T/Th/Sat). She remains sore from a AV fistula revision 03/25/2020  Device History: St. Jude Dual Chamber PPM implanted 01/2009, gen change 05/16/2016 for SSS/second degree AV block.    Past Medical History:  Diagnosis Date  . Anemia   . Arthritis   . Benign paroxysmal positional vertigo 04/06/2010  . Benign positional vertigo   . 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 CAROTID BRUIT 02/24/2008  . Complication of anesthesia    took a long time to wake up with knee replacement  . DIABETES MELLITUS, TYPE II 02/24/2008  . ESRD (end stage renal disease) on dialysis ([place])    tues thurs sat nw kidney center sees [place] [facility]  . GERD (gastroesophageal reflux disease)   . Glaucoma   . Gout   . HYPERTENSION 02/24/2008  . Multiple myeloma ([place])   . Obstructive sleep apnea   . Pancytopenia ([place])   . Sick sinus syndrome ([place])   . Wears dentures   . Wears glasses    Past Surgical History:  Procedure Laterality Date  . A/V FISTULAGRAM Right 03/08/2020   Procedure: A/V FISTULAGRAM- Right Arm;  Surgeon: Waynetta Sandy, MD;  Location: [place] [facility];  Service: Cardiovascular;  Laterality: Right;  . ABDOMINAL HYSTERECTOMY     1980's  . AV FISTULA  PLACEMENT Right 03/11/2019   Procedure: CREATION RIGHT ARM RADIOCEPHALIC ARTERIOVENOUS FISTULA;  Surgeon: Angelia Mould, MD;  Location: [place];  Service: Vascular;  Laterality: Right;  . AV FISTULA PLACEMENT Right 03/25/2020   Procedure: right arm ARTERIOVENOUS (AV) FISTULA CREATION;  Surgeon: Waynetta Sandy, MD;  Location: [place];  Service: Vascular;  Laterality: Right;  . BACK SURGERY     x 3  . BIOPSY  06/24/2019   Procedure: BIOPSY;  Surgeon: Ronald Lobo, MD;  Location: [facility];  Service: Endoscopy;;  . EP IMPLANTABLE DEVICE N/A 05/16/2016   Procedure: PPM Generator Changeout;  Surgeon: Thompson Grayer, MD;  Location: [place] [facility];  Service: Cardiovascular;  Laterality: N/A;  . ESOPHAGOGASTRODUODENOSCOPY (EGD) WITH PROPOFOL N/A 06/24/2019   Procedure: ESOPHAGOGASTRODUODENOSCOPY (EGD) WITH PROPOFOL;  Surgeon: Ronald Lobo, MD;  Location: [facility];  Service: Endoscopy;  Laterality: N/A;  . FISTULA SUPERFICIALIZATION Right 03/11/2019   Procedure: FISTULA SUPERFICIALIZATION;  Surgeon: Angelia Mould, MD;  Location: [place];  Service: Vascular;  Laterality: Right;  . INSERTION OF DIALYSIS CATHETER Right 03/25/2020   Procedure: INSERTION OF DIALYSIS CATHETER, right internal jugular;  Surgeon: Waynetta Sandy, MD;  Location: [place];  Service: Vascular;  Laterality: Right;  . IR FLUORO GUIDE CV LINE RIGHT  03/07/2019  . IR US GUIDE VASC ACCESS RIGHT  03/07/2019  . KNEE SURGERY Right    2003  .  KNEE SURGERY Left    2011  . PACEMAKER INSERTION      Current Outpatient Medications  Medication Sig Dispense Refill  . lactulose (CHRONULAC) 10 GM/15ML solution Take by mouth as needed for mild constipation.    Marland Kitchen acetaminophen (TYLENOL) 500 MG tablet Take 500 mg by mouth 2 (two) times daily as needed for moderate pain.    Marland Kitchen acyclovir (ZOVIRAX) 400 MG tablet TAKE 1 TABLET BY MOUTH EVERY DAY 90 tablet 2  . allopurinol (ZYLOPRIM) 100 MG tablet Take 0.5 tablets (50  mg total) by mouth daily. 30 tablet 0  . B Complex-C-Zn-Folic Acid (DIALYVITE 887-[id number] 15) 0.8 MG TABS Take 1 tablet by mouth every evening.     . cholecalciferol (VITAMIN D3) 25 MCG (1000 UT) tablet Take 1,000 Units by mouth daily.     Marland Kitchen dexamethasone (DECADRON) 2 MG tablet Take 1 tablet (2 mg total) by mouth every other day. 30 tablet 0  . dorzolamide (TRUSOPT) 2 % ophthalmic solution Place 1 drop into the left eye 2 (two) times daily.   1  . fluorometholone (FML) 0.1 % ophthalmic suspension Place 1 drop into the right eye daily.     . isosorbide-hydrALAZINE (BIDIL) 20-37.5 MG tablet Take 1 tablet by mouth 2 (two) times a day. 60 tablet 0  . lenalidomide (REVLIMID) 2.5 MG capsule Take 1 capsule three times a week after dialysis on Tuesday, Thursday and Saturdays 12 capsule 0  . LUMIGAN 0.01 % SOLN Place 1 drop into the left eye at bedtime.     Marland Kitchen LYRICA 50 MG capsule Take 50 mg by mouth at bedtime.     . ondansetron (ZOFRAN) 4 MG tablet Take 1 tablet (4 mg total) by mouth every 8 (eight) hours as needed for nausea. 30 tablet 1  . pantoprazole (PROTONIX) 40 MG tablet Take 1 tablet (40 mg total) by mouth daily as needed (acid reflux/indigestion.). 30 tablet 11  . polyethylene glycol (MIRALAX / GLYCOLAX) 17 g packet Take 17 g by mouth daily.     . prochlorperazine (COMPAZINE) 5 MG tablet Take 1 tablet (5 mg total) by mouth every 6 (six) hours as needed for nausea or vomiting. 30 tablet 1  . senna-docusate (SENOKOT-S) 8.6-50 MG tablet Take 2 tablets by mouth 2 (two) times daily. 30 tablet 0  . sevelamer carbonate (RENVELA) 800 MG tablet Take 800-1,600 mg by mouth 3 (three) times daily with meals. Depending on meal size depends on the 1 or 2 tablets    . sodium chloride (OCEAN) 0.65 % SOLN nasal spray Place 1 spray into both nostrils 4 (four) times daily as needed for congestion.     . traMADol (ULTRAM) 50 MG tablet Take 1 tablet (50 mg total) by mouth every 8 (eight) hours as needed. 6 tablet 0   No  current facility-administered medications for this visit.    Allergies:   Aspirin, Hydrocodone-acetaminophen, Morphine, Penicillins, Betaine, Dacarbazine, Ketorolac tromethamine, Brimonidine, Diltiazem hcl, Hydrocodone-acetaminophen, Neurontin [gabapentin], and Sulfacetamide sodium   Social History: Social History   Socioeconomic History  . Marital status: Married    Spouse name: Not on file  . Number of children: 5  . Years of education: Not on file  . Highest education level: Not on file  Occupational History  . Occupation: retired  Tobacco Use  . Smoking status: Never Smoker  . Smokeless tobacco: Never Used  Vaping Use  . Vaping Use: Never used  Substance and Sexual Activity  . Alcohol use: No  .  Drug use: No  . Sexual activity: Not on file  Other Topics Concern  . Not on file  Social History Narrative   Retired Marine scientist   Social Determinants of Radio broadcast assistant Strain:   . Difficulty of Paying Living Expenses:   Food Insecurity:   . Worried About Charity fundraiser in the Last Year:   . Arboriculturist in the Last Year:   Transportation Needs:   . Film/video editor (Medical):   Marland Kitchen Lack of Transportation (Non-Medical):   Physical Activity:   . Days of Exercise per Week:   . Minutes of Exercise per Session:   Stress:   . Feeling of Stress :   Social Connections:   . Frequency of Communication with Friends and Family:   . Frequency of Social Gatherings with Friends and Family:   . Attends Religious Services:   . Active Member of Clubs or Organizations:   . Attends Archivist Meetings:   Marland Kitchen Marital Status:   Intimate Partner Violence:   . Fear of Current or Ex-Partner:   . Emotionally Abused:   Marland Kitchen Physically Abused:   . Sexually Abused:     Family History: Family History  Problem Relation Age of Onset  . Congestive Heart Failure Mother   . Tuberculosis Mother   . Multiple myeloma Mother   . Bone cancer Father   . Arthritis Father    . Breast cancer Sister   . Colon cancer Sister   . Hypertension Sister   . Dementia Sister   . Alcohol abuse Son      Review of Systems: All other systems reviewed and are otherwise negative except as noted above.  Physical Exam: Vitals:   03/29/20 1258  BP: (!) 110/58  Pulse: 74  SpO2: 99%  Weight: 227 lb 3.2 oz (103.1 kg)  Height: _0  (1.651 m)     GEN- The patient is well appearing, alert and oriented x 3 today.   HEENT: normocephalic, atraumatic; sclera clear, conjunctiva pink; hearing intact; oropharynx clear; neck supple  Lungs- Clear to ausculation bilaterally, normal work of breathing.  No wheezes, rales, rhonchi Heart- Regular rate and rhythm, no murmurs, rubs or gallops  GI- soft, non-tender, non-distended, bowel sounds present  Extremities- no clubbing, cyanosis, or edema  MS- no significant deformity or atrophy Skin- warm and dry, no rash or lesion; PPM pocket well healed Psych- euthymic mood, full affect Neuro- strength and sensation are intact  PPM Interrogation- reviewed in detail today,  See PACEART report  EKG:  EKG is not ordered today. The ekg ordered 03/21/2020 shows A-V dual paced rhythm at 70 bpm. QRS 194 ms  Recent Labs: 05/25/2019: Magnesium 1.7 03/22/2020: ALT 11; Platelets 149 03/25/2020: BUN 42; Creatinine, Ser 4.80; Hemoglobin 9.2; Potassium 4.1; Sodium 137   Wt Readings from Last 3 Encounters:  03/29/20 227 lb 3.2 oz (103.1 kg)  03/25/20 125 lb 12.8 oz (57.1 kg)  03/22/20 125 lb 12.8 oz (57.1 kg)     Other studies Reviewed: Additional studies/ records that were reviewed today include: Previous EP office notes, Previous remote checks, Most recent labwork.   Assessment and Plan:  1. SSS/second degree AV block s/p St. Jude PPM  Normal PPM function See Pace Art report No changes today  2. Syncope/hypotension Device functioning normally.  Ok to stop BIDIL that she has been on for HTN. Can add back low doses of separate components if  needed, but suspect she will  eventually need midodrine.  We discussed at length today, that given her age we would rather her be mildly hypertensive, than hypotensive and at risk for falls.   3. ESRD on HD Recent issues with her fistula, and s/p revision of right arm AV fistula 03/25/20.  4. OSA Compliant with CPAP   5. Atach Previously noted on device.  Burden low overall. (<1%) She previously had an episode (06/2019) that was over 36 hrs, but no extended data is available apart from trigger EGM. If true AF, she would likely be indicated for [facility] with CHA2DS2VASC of at least 5 (female, agex2, DM2, HTN) However, with multiple myeloma, chronically low Hgb, and recent syncope would likely be poor candidate.  Will see if can get more information about the episode, though unlikely.  She has not had prolonged episodes since then.   Signed,  Shirley Friar, PA-C    03/29/2020 1:50 PM    Current medicines are reviewed at length with the patient today.   The patient does have concerns regarding her medicines.  The following changes were made today:  STOP Bidil due to hypotension  Labs/ tests ordered today include:  Orders Placed This Encounter  Procedures  . CUP PACEART INCLINIC DEVICE CHECK   Disposition:   Follow up with EP in 12 months. Sooner with issues.   Jacalyn Lefevre, PA-C  03/29/2020 1:38 PM  [place] [address] [place] [address] [phone] (office) [phone] (fax)

## 2020-03-29 NOTE — Telephone Encounter (Signed)
I reminded patient of 1 pm appointment.

## 2020-04-02 ENCOUNTER — Encounter (HOSPITAL_COMMUNITY): Payer: Self-pay

## 2020-04-02 ENCOUNTER — Emergency Department (HOSPITAL_COMMUNITY): Payer: Medicare HMO

## 2020-04-02 ENCOUNTER — Encounter: Payer: Medicare HMO | Admitting: Nurse Practitioner

## 2020-04-02 ENCOUNTER — Inpatient Hospital Stay (HOSPITAL_COMMUNITY)
Admission: EM | Admit: 2020-04-02 | Discharge: 2020-04-04 | DRG: 640 | Disposition: A | Discharge status: Home / Self Care | Payer: Medicare HMO | Attending: Internal Medicine | Admitting: Internal Medicine

## 2020-04-02 ENCOUNTER — Inpatient Hospital Stay (HOSPITAL_COMMUNITY): Payer: Medicare HMO

## 2020-04-02 ENCOUNTER — Other Ambulatory Visit: Payer: Self-pay

## 2020-04-02 ENCOUNTER — Other Ambulatory Visit (HOSPITAL_COMMUNITY): Payer: Self-pay

## 2020-04-02 DIAGNOSIS — Z803 Family history of malignant neoplasm of breast: Secondary | ICD-10-CM

## 2020-04-02 DIAGNOSIS — Z90710 Acquired absence of both cervix and uterus: Secondary | ICD-10-CM

## 2020-04-02 DIAGNOSIS — Z79899 Other long term (current) drug therapy: Secondary | ICD-10-CM

## 2020-04-02 DIAGNOSIS — E8770 Fluid overload, unspecified: Principal | ICD-10-CM | POA: Diagnosis present

## 2020-04-02 DIAGNOSIS — E1122 Type 2 diabetes mellitus with diabetic chronic kidney disease: Secondary | ICD-10-CM | POA: Diagnosis present

## 2020-04-02 DIAGNOSIS — I495 Sick sinus syndrome: Secondary | ICD-10-CM | POA: Diagnosis present

## 2020-04-02 DIAGNOSIS — I959 Hypotension, unspecified: Secondary | ICD-10-CM | POA: Diagnosis present

## 2020-04-02 DIAGNOSIS — R0603 Acute respiratory distress: Secondary | ICD-10-CM

## 2020-04-02 DIAGNOSIS — I447 Left bundle-branch block, unspecified: Secondary | ICD-10-CM | POA: Diagnosis present

## 2020-04-02 DIAGNOSIS — Z807 Family history of other malignant neoplasms of lymphoid, hematopoietic and related tissues: Secondary | ICD-10-CM

## 2020-04-02 DIAGNOSIS — H409 Unspecified glaucoma: Secondary | ICD-10-CM | POA: Diagnosis present

## 2020-04-02 DIAGNOSIS — Z885 Allergy status to narcotic agent status: Secondary | ICD-10-CM | POA: Diagnosis not present

## 2020-04-02 DIAGNOSIS — Z20822 Contact with and (suspected) exposure to covid-19: Secondary | ICD-10-CM | POA: Diagnosis present

## 2020-04-02 DIAGNOSIS — I350 Nonrheumatic aortic (valve) stenosis: Secondary | ICD-10-CM

## 2020-04-02 DIAGNOSIS — G4733 Obstructive sleep apnea (adult) (pediatric): Secondary | ICD-10-CM | POA: Diagnosis present

## 2020-04-02 DIAGNOSIS — Z992 Dependence on renal dialysis: Secondary | ICD-10-CM | POA: Diagnosis not present

## 2020-04-02 DIAGNOSIS — Z800 Family history of malignant neoplasm of digestive organs: Secondary | ICD-10-CM

## 2020-04-02 DIAGNOSIS — J9601 Acute respiratory failure with hypoxia: Secondary | ICD-10-CM | POA: Diagnosis present

## 2020-04-02 DIAGNOSIS — Z8249 Family history of ischemic heart disease and other diseases of the circulatory system: Secondary | ICD-10-CM

## 2020-04-02 DIAGNOSIS — R778 Other specified abnormalities of plasma proteins: Secondary | ICD-10-CM | POA: Diagnosis not present

## 2020-04-02 DIAGNOSIS — I082 Rheumatic disorders of both aortic and tricuspid valves: Secondary | ICD-10-CM | POA: Diagnosis present

## 2020-04-02 DIAGNOSIS — Z888 Allergy status to other drugs, medicaments and biological substances status: Secondary | ICD-10-CM

## 2020-04-02 DIAGNOSIS — Z880 Allergy status to penicillin: Secondary | ICD-10-CM | POA: Diagnosis not present

## 2020-04-02 DIAGNOSIS — I361 Nonrheumatic tricuspid (valve) insufficiency: Secondary | ICD-10-CM

## 2020-04-02 DIAGNOSIS — J810 Acute pulmonary edema: Secondary | ICD-10-CM | POA: Diagnosis present

## 2020-04-02 DIAGNOSIS — Z8261 Family history of arthritis: Secondary | ICD-10-CM

## 2020-04-02 DIAGNOSIS — Z831 Family history of other infectious and parasitic diseases: Secondary | ICD-10-CM

## 2020-04-02 DIAGNOSIS — I441 Atrioventricular block, second degree: Secondary | ICD-10-CM | POA: Diagnosis present

## 2020-04-02 DIAGNOSIS — N2581 Secondary hyperparathyroidism of renal origin: Secondary | ICD-10-CM | POA: Diagnosis present

## 2020-04-02 DIAGNOSIS — I340 Nonrheumatic mitral (valve) insufficiency: Secondary | ICD-10-CM

## 2020-04-02 DIAGNOSIS — C9000 Multiple myeloma not having achieved remission: Secondary | ICD-10-CM | POA: Diagnosis present

## 2020-04-02 DIAGNOSIS — I120 Hypertensive chronic kidney disease with stage 5 chronic kidney disease or end stage renal disease: Secondary | ICD-10-CM | POA: Diagnosis present

## 2020-04-02 DIAGNOSIS — E2740 Unspecified adrenocortical insufficiency: Secondary | ICD-10-CM | POA: Diagnosis present

## 2020-04-02 DIAGNOSIS — Z886 Allergy status to analgesic agent status: Secondary | ICD-10-CM | POA: Diagnosis not present

## 2020-04-02 DIAGNOSIS — R931 Abnormal findings on diagnostic imaging of heart and coronary circulation: Secondary | ICD-10-CM | POA: Diagnosis not present

## 2020-04-02 DIAGNOSIS — N186 End stage renal disease: Secondary | ICD-10-CM | POA: Diagnosis present

## 2020-04-02 DIAGNOSIS — D631 Anemia in chronic kidney disease: Secondary | ICD-10-CM | POA: Diagnosis present

## 2020-04-02 DIAGNOSIS — R0902 Hypoxemia: Secondary | ICD-10-CM

## 2020-04-02 DIAGNOSIS — K219 Gastro-esophageal reflux disease without esophagitis: Secondary | ICD-10-CM | POA: Diagnosis present

## 2020-04-02 DIAGNOSIS — R7989 Other specified abnormal findings of blood chemistry: Secondary | ICD-10-CM | POA: Diagnosis present

## 2020-04-02 DIAGNOSIS — Z950 Presence of cardiac pacemaker: Secondary | ICD-10-CM | POA: Diagnosis not present

## 2020-04-02 DIAGNOSIS — E8889 Other specified metabolic disorders: Secondary | ICD-10-CM | POA: Diagnosis present

## 2020-04-02 DIAGNOSIS — I2510 Atherosclerotic heart disease of native coronary artery without angina pectoris: Secondary | ICD-10-CM | POA: Diagnosis present

## 2020-04-02 DIAGNOSIS — I953 Hypotension of hemodialysis: Secondary | ICD-10-CM | POA: Diagnosis not present

## 2020-04-02 LAB — TROPONIN I (HIGH SENSITIVITY)
Troponin I (High Sensitivity): 101 ng/L (ref <18)
Troponin I (High Sensitivity): 99 ng/L — ABNORMAL HIGH (ref <18)
Troponin I (High Sensitivity): 172 ng/L (ref <18)
Troponin I (High Sensitivity): 160 ng/L (ref <18)

## 2020-04-02 LAB — PROTIME-INR
INR: 1 (ref 0.8–1.2)
Prothrombin Time: 12.7 seconds (ref 11.4–15.2)

## 2020-04-02 LAB — BASIC METABOLIC PANEL
Anion gap: 15 (ref 5–15)
BUN: 13 mg/dL (ref 8–23)
CO2: 25 mmol/L (ref 22–32)
Calcium: 8.4 mg/dL — ABNORMAL LOW (ref 8.9–10.3)
Chloride: 96 mmol/L — ABNORMAL LOW (ref 98–111)
Creatinine, Ser: 2.69 mg/dL — ABNORMAL HIGH (ref 0.44–1.00)
GFR calc Af Amer: 18 mL/min — ABNORMAL LOW (ref >60)
GFR calc non Af Amer: 15 mL/min — ABNORMAL LOW (ref >60)
Glucose, Bld: 96 mg/dL (ref 70–99)
Potassium: 3.7 mmol/L (ref 3.5–5.1)
Sodium: 136 mmol/L (ref 135–145)

## 2020-04-02 LAB — I-STAT CHEM 8, ED
BUN: 15 mg/dL (ref 8–23)
Calcium, Ion: 0.95 mmol/L — ABNORMAL LOW (ref 1.15–1.40)
Chloride: 95 mmol/L — ABNORMAL LOW (ref 98–111)
Creatinine, Ser: 2.9 mg/dL — ABNORMAL HIGH (ref 0.44–1.00)
Glucose, Bld: 96 mg/dL (ref 70–99)
HCT: 30 % — ABNORMAL LOW (ref 36.0–46.0)
Hemoglobin: 10.2 g/dL — ABNORMAL LOW (ref 12.0–15.0)
Potassium: 3.4 mmol/L — ABNORMAL LOW (ref 3.5–5.1)
Sodium: 135 mmol/L (ref 135–145)
TCO2: 29 mmol/L (ref 22–32)

## 2020-04-02 LAB — GLUCOSE, CAPILLARY
Glucose-Capillary: 81 mg/dL (ref 70–99)
Glucose-Capillary: 97 mg/dL (ref 70–99)

## 2020-04-02 LAB — CBC
HCT: 29.2 % — ABNORMAL LOW (ref 36.0–46.0)
Hemoglobin: 9.5 g/dL — ABNORMAL LOW (ref 12.0–15.0)
MCH: 30.1 pg (ref 26.0–34.0)
MCHC: 32.5 g/dL (ref 30.0–36.0)
MCV: 92.4 fL (ref 80.0–100.0)
Platelets: 252 10*3/uL (ref 150–400)
RBC: 3.16 MIL/uL — ABNORMAL LOW (ref 3.87–5.11)
RDW: 17 % — ABNORMAL HIGH (ref 11.5–15.5)
WBC: 4 10*3/uL (ref 4.0–10.5)
nRBC: 0 % (ref 0.0–0.2)

## 2020-04-02 LAB — BRAIN NATRIURETIC PEPTIDE: B Natriuretic Peptide: 260.8 pg/mL — ABNORMAL HIGH (ref 0.0–100.0)

## 2020-04-02 LAB — CBG MONITORING, ED: Glucose-Capillary: 77 mg/dL (ref 70–99)

## 2020-04-02 LAB — HEMOGLOBIN A1C
Hgb A1c MFr Bld: 4.7 % — ABNORMAL LOW (ref 4.8–5.6)
Mean Plasma Glucose: 88.19 mg/dL

## 2020-04-02 LAB — SARS CORONAVIRUS 2 BY RT PCR (HOSPITAL ORDER, PERFORMED IN ~~LOC~~ HOSPITAL LAB): SARS Coronavirus 2: NEGATIVE

## 2020-04-02 LAB — MAGNESIUM: Magnesium: 2 mg/dL (ref 1.7–2.4)

## 2020-04-02 LAB — ECHOCARDIOGRAM COMPLETE

## 2020-04-02 MED ORDER — RENA-VITE PO TABS
1.0000 | ORAL_TABLET | Freq: Every evening | ORAL | Status: DC
  Administered 2020-04-02 – 2020-04-03 (×2): 1 via ORAL
  Filled 2020-04-02 (×3): qty 1

## 2020-04-02 MED ORDER — ALLOPURINOL 100 MG PO TABS
50.0000 mg | ORAL_TABLET | Freq: Every day | ORAL | Status: DC
  Administered 2020-04-02 – 2020-04-04 (×3): 50 mg via ORAL
  Filled 2020-04-02: qty 0.5
  Filled 2020-04-02 (×2): qty 1

## 2020-04-02 MED ORDER — ONDANSETRON HCL 4 MG PO TABS: 4.0000 mg | ORAL_TABLET | Freq: Four times a day (QID) | ORAL | Status: DC | PRN

## 2020-04-02 MED ORDER — ONDANSETRON HCL 4 MG/2ML IJ SOLN: 4.0000 mg | Freq: Four times a day (QID) | INTRAMUSCULAR | Status: DC | PRN

## 2020-04-02 MED ORDER — TRAMADOL HCL 50 MG PO TABS: 50.0000 mg | ORAL_TABLET | Freq: Three times a day (TID) | ORAL | Status: DC | PRN

## 2020-04-02 MED ORDER — ONDANSETRON HCL 4 MG PO TABS: 4.0000 mg | ORAL_TABLET | Freq: Three times a day (TID) | ORAL | Status: DC | PRN

## 2020-04-02 MED ORDER — ACETAMINOPHEN 325 MG PO TABS
650.0000 mg | ORAL_TABLET | Freq: Four times a day (QID) | ORAL | Status: DC | PRN
  Administered 2020-04-02 – 2020-04-03 (×3): 650 mg via ORAL
  Filled 2020-04-02 (×3): qty 2

## 2020-04-02 MED ORDER — SODIUM CHLORIDE 0.9% FLUSH
3.0000 mL | Freq: Two times a day (BID) | INTRAVENOUS | Status: DC
  Administered 2020-04-02 – 2020-04-03 (×2): 3 mL via INTRAVENOUS

## 2020-04-02 MED ORDER — PROCHLORPERAZINE MALEATE 5 MG PO TABS
5.0000 mg | ORAL_TABLET | Freq: Four times a day (QID) | ORAL | Status: DC | PRN
  Filled 2020-04-02: qty 1

## 2020-04-02 MED ORDER — FLUOROMETHOLONE 0.1 % OP SUSP
1.0000 [drp] | Freq: Every day | OPHTHALMIC | Status: DC
  Administered 2020-04-03 – 2020-04-04 (×2): 1 [drp] via OPHTHALMIC
  Filled 2020-04-02: qty 5

## 2020-04-02 MED ORDER — SALINE SPRAY 0.65 % NA SOLN
1.0000 | Freq: Four times a day (QID) | NASAL | Status: DC | PRN
  Filled 2020-04-02: qty 44

## 2020-04-02 MED ORDER — HEPARIN SODIUM (PORCINE) 5000 UNIT/ML IJ SOLN
5000.0000 [IU] | Freq: Three times a day (TID) | INTRAMUSCULAR | Status: DC
  Administered 2020-04-02 – 2020-04-04 (×7): 5000 [IU] via SUBCUTANEOUS
  Filled 2020-04-02 (×7): qty 1

## 2020-04-02 MED ORDER — LACTULOSE 10 GM/15ML PO SOLN
10.0000 g | ORAL | Status: DC | PRN
  Filled 2020-04-02: qty 15

## 2020-04-02 MED ORDER — SENNOSIDES-DOCUSATE SODIUM 8.6-50 MG PO TABS
2.0000 | ORAL_TABLET | Freq: Two times a day (BID) | ORAL | Status: DC
  Administered 2020-04-02 – 2020-04-04 (×5): 2 via ORAL
  Filled 2020-04-02 (×5): qty 2

## 2020-04-02 MED ORDER — PANTOPRAZOLE SODIUM 40 MG PO TBEC: 40.0000 mg | DELAYED_RELEASE_TABLET | Freq: Every day | ORAL | Status: DC | PRN

## 2020-04-02 MED ORDER — SODIUM CHLORIDE 0.9% FLUSH: 3.0000 mL | INTRAVENOUS | Status: DC | PRN

## 2020-04-02 MED ORDER — ASPIRIN EC 81 MG PO TBEC
81.0000 mg | DELAYED_RELEASE_TABLET | Freq: Every day | ORAL | Status: DC
  Administered 2020-04-02 – 2020-04-04 (×3): 81 mg via ORAL
  Filled 2020-04-02 (×3): qty 1

## 2020-04-02 MED ORDER — POLYETHYLENE GLYCOL 3350 17 G PO PACK
17.0000 g | PACK | Freq: Every day | ORAL | Status: DC
  Administered 2020-04-02 – 2020-04-03 (×2): 17 g via ORAL
  Filled 2020-04-02 (×3): qty 1

## 2020-04-02 MED ORDER — CHLORHEXIDINE GLUCONATE CLOTH 2 % EX PADS
6.0000 | MEDICATED_PAD | Freq: Every day | CUTANEOUS | Status: DC
  Administered 2020-04-03: 6 via TOPICAL

## 2020-04-02 MED ORDER — ACYCLOVIR 400 MG PO TABS
400.0000 mg | ORAL_TABLET | Freq: Every day | ORAL | Status: DC
  Administered 2020-04-02 – 2020-04-04 (×3): 400 mg via ORAL
  Filled 2020-04-02 (×3): qty 1

## 2020-04-02 MED ORDER — INSULIN ASPART 100 UNIT/ML ~~LOC~~ SOLN: 0.0000 [IU] | Freq: Three times a day (TID) | SUBCUTANEOUS | Status: DC

## 2020-04-02 MED ORDER — SODIUM CHLORIDE 0.9 % IV SOLN: 250.0000 mL | INTRAVENOUS | Status: DC | PRN

## 2020-04-02 MED ORDER — DORZOLAMIDE HCL 2 % OP SOLN
1.0000 [drp] | Freq: Two times a day (BID) | OPHTHALMIC | Status: DC
  Administered 2020-04-02 – 2020-04-04 (×4): 1 [drp] via OPHTHALMIC
  Filled 2020-04-02: qty 10

## 2020-04-02 MED ORDER — PREGABALIN 25 MG PO CAPS
50.0000 mg | ORAL_CAPSULE | Freq: Every day | ORAL | Status: DC
  Administered 2020-04-02 – 2020-04-03 (×2): 50 mg via ORAL
  Filled 2020-04-02 (×2): qty 2

## 2020-04-02 MED ORDER — SODIUM CHLORIDE 0.9% FLUSH
3.0000 mL | Freq: Two times a day (BID) | INTRAVENOUS | Status: DC
  Administered 2020-04-02 (×2): 3 mL via INTRAVENOUS

## 2020-04-02 MED ORDER — VITAMIN D 25 MCG (1000 UNIT) PO TABS
1000.0000 [IU] | ORAL_TABLET | Freq: Every day | ORAL | Status: DC
  Administered 2020-04-02 – 2020-04-04 (×3): 1000 [IU] via ORAL
  Filled 2020-04-02 (×3): qty 1

## 2020-04-02 MED ORDER — POTASSIUM CHLORIDE CRYS ER 20 MEQ PO TBCR
40.0000 meq | EXTENDED_RELEASE_TABLET | Freq: Once | ORAL | Status: AC
  Administered 2020-04-02: 40 meq via ORAL
  Filled 2020-04-02: qty 2

## 2020-04-02 MED ORDER — ACETAMINOPHEN 650 MG RE SUPP: 650.0000 mg | Freq: Four times a day (QID) | RECTAL | Status: DC | PRN

## 2020-04-02 MED ORDER — LATANOPROST 0.005 % OP SOLN
1.0000 [drp] | Freq: Every day | OPHTHALMIC | Status: DC
  Administered 2020-04-02 – 2020-04-03 (×2): 1 [drp] via OPHTHALMIC
  Filled 2020-04-02: qty 2.5

## 2020-04-02 MED ORDER — SENNOSIDES-DOCUSATE SODIUM 8.6-50 MG PO TABS: 1.0000 | ORAL_TABLET | Freq: Every evening | ORAL | Status: DC | PRN

## 2020-04-02 MED ORDER — LENALIDOMIDE 2.5 MG PO CAPS
2.5000 mg | ORAL_CAPSULE | ORAL | Status: DC
  Administered 2020-04-03: 2.5 mg via ORAL
  Filled 2020-04-02: qty 1

## 2020-04-02 MED ORDER — SEVELAMER CARBONATE 800 MG PO TABS
800.0000 mg | ORAL_TABLET | Freq: Three times a day (TID) | ORAL | Status: DC
  Administered 2020-04-02: 1600 mg via ORAL
  Administered 2020-04-02 – 2020-04-04 (×5): 800 mg via ORAL
  Filled 2020-04-02 (×4): qty 1
  Filled 2020-04-02: qty 2

## 2020-04-02 MED ORDER — IOHEXOL 350 MG/ML SOLN
100.0000 mL | Freq: Once | INTRAVENOUS | Status: AC | PRN
  Administered 2020-04-02: 100 mL via INTRAVENOUS

## 2020-04-02 MED ORDER — DEXAMETHASONE 2 MG PO TABS
2.0000 mg | ORAL_TABLET | ORAL | Status: DC
  Administered 2020-04-03: 2 mg via ORAL
  Filled 2020-04-02 (×2): qty 1

## 2020-04-02 NOTE — Consult Note (Signed)
[facility] Renal Consultation Note    Indication for Consultation:  Management of ESRD/hemodialysis; anemia, hypertension/volume and secondary hyperparathyroidism  HPI: [name] is a 86 y.o. female with ESRD on HD. [place]. PMH also significant for multiple myeloma, sick sinus syndrome s/p PPM, DM2, HTN, BPPV,  OSA on CPAP.   She is admitted under observation status with acute onset dyspnea. Presented to the ED with sudden onset of SOB, confusion last night. Per notes,  O2 sats 88% on EMS arrival and she was placed on CPAP. CXR clear, CTA neg for PE. Labs WNL for ESRD patient.   Seen and examined in ED. Breathing comfortably on RA. O2 sats 100%. She feels better than she did last night. She thinks she may have forgotten to wear her CPAP last night. She completed a full dialysis session yesterday with 2.4L removed. She had no trouble breathing after dialysis.  She recently had her RUE AVF revised and currently dialyzing via [place].    Past Medical History:  Diagnosis Date  . Anemia   . Arthritis   . Benign paroxysmal positional vertigo 04/06/2010  . Benign positional vertigo   . 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 CAROTID BRUIT 02/24/2008  . Complication of anesthesia    took a long time to wake up with knee replacement  . DIABETES MELLITUS, TYPE II 02/24/2008  . ESRD (end stage renal disease) on dialysis ([place])    tues thurs sat [facility] kidney [facility] sees [place] [facility]  . GERD (gastroesophageal reflux disease)   . Glaucoma   . Gout   . HYPERTENSION 02/24/2008  . Multiple myeloma ([place])   . Obstructive sleep apnea   . Pancytopenia ([place])   . Sick sinus syndrome ([place])   . Wears dentures   . Wears glasses    Past Surgical History:  Procedure Laterality Date  . A/V FISTULAGRAM Right 03/08/2020   Procedure: A/V FISTULAGRAM- Right Arm;  Surgeon: Waynetta Sandy, MD;  Location: [facility];  Service: Cardiovascular;  Laterality: Right;  .  ABDOMINAL HYSTERECTOMY     1980's  . AV FISTULA PLACEMENT Right 03/11/2019   Procedure: CREATION RIGHT ARM RADIOCEPHALIC ARTERIOVENOUS FISTULA;  Surgeon: Angelia Mould, MD;  Location: [facility];  Service: Vascular;  Laterality: Right;  . AV FISTULA PLACEMENT Right 03/25/2020   Procedure: right arm ARTERIOVENOUS (AV) FISTULA CREATION;  Surgeon: Waynetta Sandy, MD;  Location: [place];  Service: Vascular;  Laterality: Right;  . BACK SURGERY     x 3  . BIOPSY  06/24/2019   Procedure: BIOPSY;  Surgeon: Ronald Lobo, MD;  Location: [facility];  Service: Endoscopy;;  . EP IMPLANTABLE DEVICE N/A 05/16/2016   Procedure: PPM Generator Changeout;  Surgeon: Thompson Grayer, MD;  Location: [facility];  Service: Cardiovascular;  Laterality: N/A;  . ESOPHAGOGASTRODUODENOSCOPY (EGD) WITH PROPOFOL N/A 06/24/2019   Procedure: ESOPHAGOGASTRODUODENOSCOPY (EGD) WITH PROPOFOL;  Surgeon: Ronald Lobo, MD;  Location: [facility];  Service: Endoscopy;  Laterality: N/A;  . FISTULA SUPERFICIALIZATION Right 03/11/2019   Procedure: FISTULA SUPERFICIALIZATION;  Surgeon: Angelia Mould, MD;  Location: [facility];  Service: Vascular;  Laterality: Right;  . INSERTION OF DIALYSIS CATHETER Right 03/25/2020   Procedure: INSERTION OF DIALYSIS CATHETER, right internal jugular;  Surgeon: Waynetta Sandy, MD;  Location: [facility];  Service: Vascular;  Laterality: Right;  . IR FLUORO GUIDE CV LINE RIGHT  03/07/2019  . IR US GUIDE VASC ACCESS RIGHT  03/07/2019  .  KNEE SURGERY Right    2003  . KNEE SURGERY Left    2011  . PACEMAKER INSERTION     Family History  Problem Relation Age of Onset  . Congestive Heart Failure Mother   . Tuberculosis Mother   . Multiple myeloma Mother   . Bone cancer Father   . Arthritis Father   . Breast cancer Sister   . Colon cancer Sister   . Hypertension Sister   . Dementia Sister   . Alcohol abuse Son    Social History:  reports that she has never smoked. She has  never used smokeless tobacco. She reports that she does not drink alcohol and does not use drugs. Allergies  Allergen Reactions  . Aspirin Other (See Comments)    REACTION: STOMACH ISSUES WITH DOSE HIGHER THAN 81 MG  Other reaction(s): GI Upset (intolerance), Other (See Comments) REACTION: STOMACH ISSUES WITH DOSE HIGHER THAN 81 MG  REACTION: STOMACH ISSUES WITH DOSE HIGHER THAN 81 MG   . Hydrocodone-Acetaminophen Nausea And Vomiting  . Morphine Nausea And Vomiting  . Penicillins Rash    Patient took injection and tablets, had a reaction. She has taken amoxicillin with no reaction Has patient had a PCN reaction causing immediate rash, facial/tongue/throat swelling, SOB or lightheadedness with hypotension: Yes Has patient had a PCN reaction causing severe rash involving mucus membranes or skin necrosis: Yes Has patient had a PCN reaction that required hospitalization No Has patient had a PCN reaction occurring within the last 10 years: No If all of the above answers are "NO", then m Patient took injection and tablets, had a reaction. She has taken amoxicillin with no reaction Patient took injection and tablets, had a reaction. She has taken amoxicillin with no reaction Has patient had a PCN reaction causing immediate rash, facial/tongue/throat swelling, SOB or lightheadedness with hypotension: Yes Has patient had a PCN reaction causing severe rash involving mucus membranes or skin necrosis: Yes Has patient had a PCN reaction that required hospitalization No Has patient had a PCN reaction occurring within the last 10 years: No If all of the above answers are "NO", then m  . Betaine Nausea And Vomiting  . Dacarbazine Nausea And Vomiting  . Ketorolac Tromethamine Itching  . Brimonidine Itching  . Diltiazem Hcl Rash  . Hydrocodone-Acetaminophen Nausea And Vomiting  . Neurontin [Gabapentin] Nausea And Vomiting  . Sulfacetamide Sodium Rash   Prior to Admission medications   Medication Sig  Start Date End Date Taking? Authorizing Provider  acetaminophen (TYLENOL) 500 MG tablet Take 1,000 mg by mouth 2 (two) times daily as needed for moderate pain.    Yes [provider]  acyclovir (ZOVIRAX) 400 MG tablet TAKE 1 TABLET BY MOUTH EVERY DAY Patient taking differently: Take 400 mg by mouth daily.  08/11/19  Yes Gorsuch, Ni, MD  allopurinol (ZYLOPRIM) 100 MG tablet Take 0.5 tablets (50 mg total) by mouth daily. 03/12/19  Yes Nita Sells, MD  aspirin EC 81 MG tablet Take 81 mg by mouth daily. Swallow whole.   Yes [provider]  B Complex-C-Zn-Folic Acid (DIALYVITE 161-WRUE 15) 0.8 MG TABS Take 1 tablet by mouth every evening.  03/24/19  Yes [provider]  cholecalciferol (VITAMIN D3) 25 MCG (1000 UT) tablet Take 1,000 Units by mouth daily.    Yes [provider]  dexamethasone (DECADRON) 2 MG tablet Take 1 tablet (2 mg total) by mouth every other day. 03/22/20  Yes Gorsuch, Ni, MD  dorzolamide (TRUSOPT) 2 %  ophthalmic solution Place 1 drop into the left eye 2 (two) times daily.  12/21/17  Yes [provider]  fluorometholone (FML) 0.1 % ophthalmic suspension Place 1 drop into the right eye daily.  02/01/19  Yes [provider]  lactulose (CHRONULAC) 10 GM/15ML solution Take 10 g by mouth as needed for mild constipation.    Yes [provider]  lenalidomide (REVLIMID) 2.5 MG capsule Take 1 capsule three times a week after dialysis on Tuesday, Thursday and Saturdays 03/18/20  Yes Gorsuch, Ni, MD  LUMIGAN 0.01 % SOLN Place 1 drop into the left eye at bedtime.  02/01/19  Yes [provider]  LYRICA 50 MG capsule Take 50 mg by mouth at bedtime.  06/14/15  Yes [provider]  ondansetron (ZOFRAN) 4 MG tablet Take 1 tablet (4 mg total) by mouth every 8 (eight) hours as needed for nausea. 03/14/19  Yes Gorsuch, Ni, MD  pantoprazole (PROTONIX) 40 MG tablet Take 1 tablet (40 mg total) by mouth daily as needed (acid  reflux/indigestion.). 06/26/19  Yes Gorsuch, Ni, MD  polyethylene glycol (MIRALAX / GLYCOLAX) 17 g packet Take 17 g by mouth daily.    Yes [provider]  prochlorperazine (COMPAZINE) 5 MG tablet Take 1 tablet (5 mg total) by mouth every 6 (six) hours as needed for nausea or vomiting. 03/14/19  Yes Gorsuch, Ni, MD  senna-docusate (SENOKOT-S) 8.6-50 MG tablet Take 2 tablets by mouth 2 (two) times daily. 03/11/19  Yes Nita Sells, MD  sevelamer carbonate (RENVELA) 800 MG tablet Take 800-1,600 mg by mouth 3 (three) times daily with meals. Depending on meal size depends on the 1 or 2 tablets 04/25/19  Yes [provider]  sodium chloride (OCEAN) 0.65 % SOLN nasal spray Place 1 spray into both nostrils 4 (four) times daily as needed for congestion.    Yes [provider]  traMADol (ULTRAM) 50 MG tablet Take 1 tablet (50 mg total) by mouth every 8 (eight) hours as needed. 03/25/20  Yes Rhyne, Samantha J, PA-C  isosorbide-hydrALAZINE (BIDIL) 20-37.5 MG tablet Take 1 tablet by mouth 2 (two) times a day. Patient not taking: Reported on 04/02/2020 03/11/19   Nita Sells, MD   Current Facility-Administered Medications  Medication Dose Route Frequency Provider Last Rate Last Admin  . 0.9 %  sodium chloride infusion  250 mL Intravenous PRN Opyd, Ilene Qua, MD      . acetaminophen (TYLENOL) tablet 650 mg  650 mg Oral Q6H PRN Opyd, Ilene Qua, MD   650 mg at 04/02/20 1240   Or  . acetaminophen (TYLENOL) suppository 650 mg  650 mg Rectal Q6H PRN Opyd, Ilene Qua, MD      . acyclovir (ZOVIRAX) tablet 400 mg  400 mg Oral Daily Swayze, Ava, DO   400 mg at 04/02/20 1242  . allopurinol (ZYLOPRIM) tablet 50 mg  50 mg Oral Daily Swayze, Ava, DO   50 mg at 04/02/20 1241  . aspirin EC tablet 81 mg  81 mg Oral Daily Swayze, Ava, DO   81 mg at 04/02/20 1241  . cholecalciferol (VITAMIN D3) tablet 1,000 Units  1,000 Units Oral Daily Swayze, Ava, DO   1,000 Units at 04/02/20 1249  . [START  ON 04/03/2020] dexamethasone (DECADRON) tablet 2 mg  2 mg Oral QODAY Swayze, Ava, DO      . dorzolamide (TRUSOPT) 2 % ophthalmic solution 1 drop  1 drop Left Eye BID Swayze, Ava, DO      . fluorometholone (  FML) 0.1 % ophthalmic suspension 1 drop  1 drop Right Eye Daily Swayze, Ava, DO      . heparin injection 5,000 Units  5,000 Units Subcutaneous Q8H Opyd, Ilene Qua, MD   5,000 Units at 04/02/20 0939  . lactulose (CHRONULAC) 10 GM/15ML solution 10 g  10 g Oral PRN Swayze, Ava, DO      . latanoprost (XALATAN) 0.005 % ophthalmic solution 1 drop  1 drop Left Eye QHS Swayze, Ava, DO      . lenalidomide (REVLIMID) capsule 2.5 mg  2.5 mg Oral Q dialysis Swayze, Ava, DO      . multivitamin (RENA-VIT) tablet 1 tablet  1 tablet Oral QPM Swayze, Ava, DO      . ondansetron (ZOFRAN) tablet 4 mg  4 mg Oral Q6H PRN Opyd, Ilene Qua, MD       Or  . ondansetron (ZOFRAN) injection 4 mg  4 mg Intravenous Q6H PRN Opyd, Ilene Qua, MD      . pantoprazole (PROTONIX) EC tablet 40 mg  40 mg Oral Daily PRN Swayze, Ava, DO      . polyethylene glycol (MIRALAX / GLYCOLAX) packet 17 g  17 g Oral Daily Swayze, Ava, DO      . pregabalin (LYRICA) capsule 50 mg  50 mg Oral QHS Swayze, Ava, DO      . prochlorperazine (COMPAZINE) tablet 5 mg  5 mg Oral Q6H PRN Swayze, Ava, DO      . senna-docusate (Senokot-S) tablet 1 tablet  1 tablet Oral QHS PRN Opyd, Ilene Qua, MD      . senna-docusate (Senokot-S) tablet 2 tablet  2 tablet Oral BID Swayze, Ava, DO      . sevelamer carbonate (RENVELA) tablet 800-1,600 mg  800-1,600 mg Oral TID WC Swayze, Ava, DO      . sodium chloride (OCEAN) 0.65 % nasal spray 1 spray  1 spray Each Nare QID PRN Swayze, Ava, DO      . sodium chloride flush (NS) 0.9 % injection 3 mL  3 mL Intravenous Q12H Opyd, Timothy S, MD      . sodium chloride flush (NS) 0.9 % injection 3 mL  3 mL Intravenous Q12H Opyd, Ilene Qua, MD   3 mL at 04/02/20 0942  . sodium chloride flush (NS) 0.9 % injection 3 mL  3 mL Intravenous  PRN Opyd, Ilene Qua, MD      . traMADol (ULTRAM) tablet 50 mg  50 mg Oral Q8H PRN Swayze, Ava, DO       Current Outpatient Medications  Medication Sig Dispense Refill  . acetaminophen (TYLENOL) 500 MG tablet Take 1,000 mg by mouth 2 (two) times daily as needed for moderate pain.     Marland Kitchen acyclovir (ZOVIRAX) 400 MG tablet TAKE 1 TABLET BY MOUTH EVERY DAY (Patient taking differently: Take 400 mg by mouth daily. ) 90 tablet 2  . allopurinol (ZYLOPRIM) 100 MG tablet Take 0.5 tablets (50 mg total) by mouth daily. 30 tablet 0  . aspirin EC 81 MG tablet Take 81 mg by mouth daily. Swallow whole.    . B Complex-C-Zn-Folic Acid (DIALYVITE 333-LKTG 15) 0.8 MG TABS Take 1 tablet by mouth every evening.     . cholecalciferol (VITAMIN D3) 25 MCG (1000 UT) tablet Take 1,000 Units by mouth daily.     Marland Kitchen dexamethasone (DECADRON) 2 MG tablet Take 1 tablet (2 mg total) by mouth every other day. 30 tablet 0  . dorzolamide (TRUSOPT) 2 % ophthalmic solution  Place 1 drop into the left eye 2 (two) times daily.   1  . fluorometholone (FML) 0.1 % ophthalmic suspension Place 1 drop into the right eye daily.     Marland Kitchen lactulose (CHRONULAC) 10 GM/15ML solution Take 10 g by mouth as needed for mild constipation.     Marland Kitchen lenalidomide (REVLIMID) 2.5 MG capsule Take 1 capsule three times a week after dialysis on Tuesday, Thursday and Saturdays 12 capsule 0  . LUMIGAN 0.01 % SOLN Place 1 drop into the left eye at bedtime.     Marland Kitchen LYRICA 50 MG capsule Take 50 mg by mouth at bedtime.     . ondansetron (ZOFRAN) 4 MG tablet Take 1 tablet (4 mg total) by mouth every 8 (eight) hours as needed for nausea. 30 tablet 1  . pantoprazole (PROTONIX) 40 MG tablet Take 1 tablet (40 mg total) by mouth daily as needed (acid reflux/indigestion.). 30 tablet 11  . polyethylene glycol (MIRALAX / GLYCOLAX) 17 g packet Take 17 g by mouth daily.     . prochlorperazine (COMPAZINE) 5 MG tablet Take 1 tablet (5 mg total) by mouth every 6 (six) hours as needed for  nausea or vomiting. 30 tablet 1  . senna-docusate (SENOKOT-S) 8.6-50 MG tablet Take 2 tablets by mouth 2 (two) times daily. 30 tablet 0  . sevelamer carbonate (RENVELA) 800 MG tablet Take 800-1,600 mg by mouth 3 (three) times daily with meals. Depending on meal size depends on the 1 or 2 tablets    . sodium chloride (OCEAN) 0.65 % SOLN nasal spray Place 1 spray into both nostrils 4 (four) times daily as needed for congestion.     . traMADol (ULTRAM) 50 MG tablet Take 1 tablet (50 mg total) by mouth every 8 (eight) hours as needed. 6 tablet 0  . isosorbide-hydrALAZINE (BIDIL) 20-37.5 MG tablet Take 1 tablet by mouth 2 (two) times a day. (Patient not taking: Reported on 04/02/2020) 60 tablet 0     ROS: As per HPI otherwise negative.  Physical Exam: Vitals:   04/02/20 0501 04/02/20 0515 04/02/20 0516 04/02/20 0531  BP: (!) 120/51 121/61  (!) 122/52  Pulse: 78 69 70 70  Resp: 18 19 14 13   Temp:      TempSrc:      SpO2: 100% 100% 100% 100%     General: Well appearing elderly female, nad  Head: NCAT sclera not icteric MMM Neck: Supple. No JVD appreciated  Lungs: CTA bilaterally without wheezes, rales, or rhonchi. Breathing is unlabored. Heart: RRR with S1 S2 Abdomen: soft NT + BS Lower extremities:without edema or ischemic changes, no open wounds  Neuro: A & O  X 3. Moves all extremities spontaneously. Psych:  Responds to questions appropriately with a normal affect. Dialysis Access: R IJ TDC, RUE AVF +bruit   Labs: Basic Metabolic Panel: Recent Labs  Lab 04/02/20 0202 04/02/20 0235  NA 136 135  K 3.7 3.4*  CL 96* 95*  CO2 25  --   GLUCOSE 96 96  BUN 13 15  CREATININE 2.69* 2.90*  CALCIUM 8.4*  --    Liver Function Tests: No results for input(s): AST, ALT, ALKPHOS, BILITOT, PROT, ALBUMIN in the last 168 hours. No results for input(s): LIPASE, AMYLASE in the last 168 hours. No results for input(s): AMMONIA in the last 168 hours. CBC: Recent Labs  Lab 04/02/20 0202  04/02/20 0235  WBC 4.0  --   HGB 9.5* 10.2*  HCT 29.2* 30.0*  MCV 92.4  --  PLT 252  --    Cardiac Enzymes: No results for input(s): CKTOTAL, CKMB, CKMBINDEX, TROPONINI in the last 168 hours. CBG: Recent Labs  Lab 04/02/20 0932  GLUCAP 77   Iron Studies: No results for input(s): IRON, TIBC, TRANSFERRIN, FERRITIN in the last 72 hours. Studies/Results: CT Angio Chest PE W and/or Wo Contrast  Result Date: 04/02/2020 CLINICAL DATA:  Shortness of breath EXAM: CT ANGIOGRAPHY CHEST WITH CONTRAST TECHNIQUE: Multidetector CT imaging of the chest was performed using the standard protocol during bolus administration of intravenous contrast. Multiplanar CT image reconstructions and MIPs were obtained to evaluate the vascular anatomy. CONTRAST:  160m OMNIPAQUE IOHEXOL 350 MG/ML SOLN COMPARISON:  March 20, 2010 FINDINGS: Cardiovascular: There is a optimal opacification of the pulmonary arteries. There is no central,segmental, or subsegmental filling defects within the pulmonary arteries. There is moderate cardiomegaly. Coronary artery calcifications are seen. No pericardial effusion or thickening. No evidence right heart strain. There is normal three-vessel brachiocephalic anatomy without proximal stenosis. Scattered aortic atherosclerosis is noted. Mediastinum/Nodes: No hilar, mediastinal, or axillary adenopathy. Thyroid gland, trachea, and esophagus demonstrate no significant findings. Lungs/Pleura: Minimal bibasilar atelectasis is seen. No large airspace consolidation is noted. No pleural effusion or pneumothorax. For Upper Abdomen: No acute abnormalities present in the visualized portions of the upper abdomen. Musculoskeletal: There is diffuse osteopenia. There heterogeneous lytic lesions seen scattered throughout the bilateral ribs and thoracic spine. No osseous fracture however is noted. Advanced bilateral shoulder osteoarthritis is noted. Review of the MIP images confirms the above findings. IMPRESSION:  1. No central, segmental, or subsegmental pulmonary embolism. 2. Moderate cardiomegaly. 3. No other acute intrathoracic pathology to explain the patient's symptoms. 4. Scattered lytic lesions throughout the visualized axial and appendicular skeleton, which could be due to metastatic process versus monoclonal gammopathy such is multiple myeloma Electronically Signed   By: BPrudencio PairM.D.   On: 04/02/2020 04:02   DG Chest Portable 1 View  Result Date: 04/02/2020 CLINICAL DATA:  Shortness of breath EXAM: PORTABLE CHEST 1 VIEW COMPARISON:  03/25/2020 FINDINGS: Left pacer and right dialysis catheter remain in place, unchanged. Cardiomegaly. Aortic atherosclerosis. Lungs clear. No effusions or edema. No acute bony abnormality. IMPRESSION: Cardiomegaly.  No active disease. Electronically Signed   By: KRolm BaptiseM.D.   On: 04/02/2020 02:06    Dialysis Orders:  [facility] 4h 146m  350/800 EDW 55.5kg 2K/2Ca TDC Heparin 2000  Hectorol 1 2x/week.    Assessment/Plan: 1. Acute onset dyspnea - Improved this am. Currently off supp O2 with O2 sats 100%.  CXR neg for plum edema. No volume on exam.  2. ESRD -  [facility] T[facility]. No urgent indication for HD today. Plan HD tomorrow on schedule and will challenge EDW.  3. Hypertension/volume  - BP controlled. Occasional episodes of hypotension on dialysis. No volume overload on exam. Below EDW today. 4. Anemia  - Hb 10.2, not on ESA as outpatient. Follow.  5. Metabolic bone disease -  Ca ok. Continue binders/VDRA  6. Multiple myeloma - Followed by Dr. GoAlvy Bimler7. OSA on CPAP   OgLynnda ChildA-C Ca[place]akfieldager [id number]/18/2021, 1:18 PM

## 2020-04-02 NOTE — ED Notes (Signed)
Assisted pt to toiled without oxygen.  Pt was in no respiratory distress.  Sats remained 100% on RA.

## 2020-04-02 NOTE — H&P (Signed)
History and Physical    [name]:[id number] DOB: 08-14-1930 DOA: 04/02/2020  PCP: Lucianne Lei, MD   Patient coming from: Home   Chief Complaint: SOB   HPI: [name] is a 86 y.o. female with medical history significant for sick sinus syndrome with pacer, ESRD on hemodialysis, OSA on CPAP, multiple myeloma not in remission, and recent hypotension with syncope, now presenting to the emergency department with shortness of breath.  Patient reports that she had been in her usual state of health, completed dialysis, and had dinner before going to bed, but shortly after laying down she developed acute onset shortness of breath.  She had not been coughing, denies fevers or chills, and denies any chest pain or palpitations.  There was no nausea or diaphoresis associated with this.  Patient reports feeling as though she could not catch her breath but now wonders if this could have just been a panic attack.  She had never experienced this sensation previously.  Her son checks her blood pressure daily after her antihypertensives were recently stopped due to hypotension, and she reports that she blood pressure has been consistently normal off of the medications.    EMS was called, found the patient to be saturating 88% on room air, placed on nonrebreather, she continued to have labored respirations and was transitioned to CPAP prior to arrival in the ED.  ED Course: Upon arrival to the ED, patient is found to be afebrile, weaned down to 4 L/min via nasal cannula, tachypneic, and with blood pressure 112/79.  EKG features sinus rhythm with LBBB.  Chest x-ray notable for cardiomegaly but no acute cardiopulmonary disease.  CTA chest is negative for PE but notable for moderate cardiomegaly and scattered lytic lesions.  Chemistry panel features a normal potassium, normal bicarbonate, and normal BUN.  CBC with stable normocytic anemia.  BNP is elevated to 261 and troponin is elevated to 101.  COVID-19  PCR is negative.  ED physician discussed the case with cardiology who recommended checking another troponin level but after reviewing the case including EKG, had low suspicion for ACS.  With new supplemental oxygen requirement, hospitalists were asked to admit.   Review of Systems:  All other systems reviewed and apart from HPI, are negative.  Past Medical History:  Diagnosis Date  . Anemia   . Arthritis   . Benign paroxysmal positional vertigo 04/06/2010  . Benign positional vertigo   . 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 CAROTID BRUIT 02/24/2008  . Complication of anesthesia    took a long time to wake up with knee replacement  . DIABETES MELLITUS, TYPE II 02/24/2008  . ESRD (end stage renal disease) on dialysis ([place])    tues thurs [facility] sees [name]  . GERD (gastroesophageal reflux disease)   . Glaucoma   . Gout   . HYPERTENSION 02/24/2008  . Multiple myeloma ([place])   . Obstructive sleep apnea   . Pancytopenia ([place])   . Sick sinus syndrome ([name])   . Wears dentures   . Wears glasses     Past Surgical History:  Procedure Laterality Date  . A/V FISTULAGRAM Right 03/08/2020   Procedure: A/V FISTULAGRAM- Right Arm;  Surgeon: [name] Sandy, MD;  Location: [facility];  Service: Cardiovascular;  Laterality: Right;  . ABDOMINAL HYSTERECTOMY     1980's  . AV FISTULA PLACEMENT Right 03/11/2019   Procedure: CREATION RIGHT ARM RADIOCEPHALIC ARTERIOVENOUS FISTULA;  Surgeon: [name]  S, MD;  Location: [facility];  Service: Vascular;  Laterality: Right;  . AV FISTULA PLACEMENT Right 03/25/2020   Procedure: right arm ARTERIOVENOUS (AV) FISTULA CREATION;  Surgeon: [name] Sandy, MD;  Location: [facility];  Service: Vascular;  Laterality: Right;  . BACK SURGERY     x 3  . BIOPSY  06/24/2019   Procedure: BIOPSY;  Surgeon: [name] Lobo, MD;  Location: [facility];  Service: Endoscopy;;  . EP IMPLANTABLE DEVICE N/A 05/16/2016   Procedure: PPM  Generator Changeout;  Surgeon: [name] Grayer, MD;  Location: [facility];  Service: Cardiovascular;  Laterality: N/A;  . ESOPHAGOGASTRODUODENOSCOPY (EGD) WITH PROPOFOL N/A 06/24/2019   Procedure: ESOPHAGOGASTRODUODENOSCOPY (EGD) WITH PROPOFOL;  Surgeon: [name] Lobo, MD;  Location: [facility];  Service: Endoscopy;  Laterality: N/A;  . FISTULA SUPERFICIALIZATION Right 03/11/2019   Procedure: FISTULA SUPERFICIALIZATION;  Surgeon: [name] Mould, MD;  Location: [facility];  Service: Vascular;  Laterality: Right;  . INSERTION OF DIALYSIS CATHETER Right 03/25/2020   Procedure: INSERTION OF DIALYSIS CATHETER, right internal jugular;  Surgeon: [name] Sandy, MD;  Location: [facility];  Service: Vascular;  Laterality: Right;  . IR FLUORO GUIDE CV LINE RIGHT  03/07/2019  . IR US GUIDE VASC ACCESS RIGHT  03/07/2019  . KNEE SURGERY Right    2003  . KNEE SURGERY Left    2011  . PACEMAKER INSERTION       reports that she has never smoked. She has never used smokeless tobacco. She reports that she does not drink alcohol and does not use drugs.  Allergies  Allergen Reactions  . Aspirin Other (See Comments)    REACTION: STOMACH ISSUES WITH DOSE HIGHER THAN 81 MG  Other reaction(s): GI Upset (intolerance), Other (See Comments) REACTION: STOMACH ISSUES WITH DOSE HIGHER THAN 81 MG  REACTION: STOMACH ISSUES WITH DOSE HIGHER THAN 81 MG   . Hydrocodone-Acetaminophen Nausea And Vomiting  . Morphine Nausea And Vomiting  . Penicillins Rash    Patient took injection and tablets, had a reaction. She has taken amoxicillin with no reaction Has patient had a PCN reaction causing immediate rash, facial/tongue/throat swelling, SOB or lightheadedness with hypotension: Yes Has patient had a PCN reaction causing severe rash involving mucus membranes or skin necrosis: Yes Has patient had a PCN reaction that required hospitalization No Has patient had a PCN reaction occurring within the last 10 years:  No If all of the above answers are "NO", then m Patient took injection and tablets, had a reaction. She has taken amoxicillin with no reaction Patient took injection and tablets, had a reaction. She has taken amoxicillin with no reaction Has patient had a PCN reaction causing immediate rash, facial/tongue/throat swelling, SOB or lightheadedness with hypotension: Yes Has patient had a PCN reaction causing severe rash involving mucus membranes or skin necrosis: Yes Has patient had a PCN reaction that required hospitalization No Has patient had a PCN reaction occurring within the last 10 years: No If all of the above answers are "NO", then m  . Betaine Nausea And Vomiting  . Dacarbazine Nausea And Vomiting  . Ketorolac Tromethamine Itching  . Brimonidine Itching  . Diltiazem Hcl Rash  . Hydrocodone-Acetaminophen Nausea And Vomiting  . Neurontin [Gabapentin] Nausea And Vomiting  . Sulfacetamide Sodium Rash    Family History  Problem Relation Age of Onset  . Congestive Heart Failure Mother   . Tuberculosis Mother   . Multiple myeloma Mother   . Bone cancer Father   .  Arthritis Father   . Breast cancer Sister   . Colon cancer Sister   . Hypertension Sister   . Dementia Sister   . Alcohol abuse Son      Prior to Admission medications   Medication Sig Start Date End Date Taking? Authorizing Provider  acetaminophen (TYLENOL) 500 MG tablet Take 500 mg by mouth 2 (two) times daily as needed for moderate pain.    [provider]  acyclovir (ZOVIRAX) 400 MG tablet TAKE 1 TABLET BY MOUTH EVERY DAY 08/11/19   Heath Lark, MD  allopurinol (ZYLOPRIM) 100 MG tablet Take 0.5 tablets (50 mg total) by mouth daily. 03/12/19   Nita Sells, MD  B Complex-C-Zn-Folic Acid (DIALYVITE 716-RCVE 15) 0.8 MG TABS Take 1 tablet by mouth every evening.  03/24/19   [provider]  cholecalciferol (VITAMIN D3) 25 MCG (1000 UT) tablet Take 1,000 Units by mouth daily.     [provider]  dexamethasone (DECADRON) 2 MG tablet Take 1 tablet (2 mg total) by mouth every other day. 03/22/20   Heath Lark, MD  dorzolamide (TRUSOPT) 2 % ophthalmic solution Place 1 drop into the left eye 2 (two) times daily.  12/21/17   [provider]  fluorometholone (FML) 0.1 % ophthalmic suspension Place 1 drop into the right eye daily.  02/01/19   [provider]  isosorbide-hydrALAZINE (BIDIL) 20-37.5 MG tablet Take 1 tablet by mouth 2 (two) times a day. 03/11/19   Nita Sells, MD  lactulose (CHRONULAC) 10 GM/15ML solution Take by mouth as needed for mild constipation.    [provider]  lenalidomide (REVLIMID) 2.5 MG capsule Take 1 capsule three times a week after dialysis on Tuesday, Thursday and Saturdays 03/18/20   Heath Lark, MD  LUMIGAN 0.01 % SOLN Place 1 drop into the left eye at bedtime.  02/01/19   [provider]  LYRICA 50 MG capsule Take 50 mg by mouth at bedtime.  06/14/15   [provider]  ondansetron (ZOFRAN) 4 MG tablet Take 1 tablet (4 mg total) by mouth every 8 (eight) hours as needed for nausea. 03/14/19   Heath Lark, MD  pantoprazole (PROTONIX) 40 MG tablet Take 1 tablet (40 mg total) by mouth daily as needed (acid reflux/indigestion.). 06/26/19   Heath Lark, MD  polyethylene glycol (MIRALAX / GLYCOLAX) 17 g packet Take 17 g by mouth daily.     [provider]  prochlorperazine (COMPAZINE) 5 MG tablet Take 1 tablet (5 mg total) by mouth every 6 (six) hours as needed for nausea or vomiting. 03/14/19   Heath Lark, MD  senna-docusate (SENOKOT-S) 8.6-50 MG tablet Take 2 tablets by mouth 2 (two) times daily. 03/11/19   Nita Sells, MD  sevelamer carbonate (RENVELA) 800 MG tablet Take 800-1,600 mg by mouth 3 (three) times daily with meals. Depending on meal size depends on the 1 or 2 tablets 04/25/19   [provider]  sodium chloride (OCEAN) 0.65 % SOLN nasal spray Place 1 spray into both nostrils 4  (four) times daily as needed for congestion.     [provider]  traMADol (ULTRAM) 50 MG tablet Take 1 tablet (50 mg total) by mouth every 8 (eight) hours as needed. 03/25/20   Gabriel Earing, PA-C    Physical Exam: Vitals:   04/02/20 0213 04/02/20 0228 04/02/20 0243 04/02/20 0258  BP: 138/61 (!) 138/55 112/79   Pulse: 70 70 73 72  Resp: 16 (!) _0 Temp:  TempSrc:      SpO2: 100% 100% 100% 100%    Constitutional: NAD, calm  Eyes: PERTLA, lids and conjunctivae normal ENMT: Mucous membranes are moist. Posterior pharynx clear of any exudate or lesions.   Neck: normal, supple, no masses, no thyromegaly Respiratory: no wheezing, no crackles. No accessory muscle use.  Cardiovascular: S1 & S2 heard, regular rate and rhythm. No extremity edema.   Abdomen: No distension, no tenderness, soft. Bowel sounds active.  Musculoskeletal: no clubbing / cyanosis. No joint deformity upper and lower extremities.   Skin: no significant rashes, lesions, ulcers. Warm, dry, well-perfused. Neurologic: CN 2-12 grossly intact. Sensation intact. Moving all extremities.  Psychiatric: Alert and oriented to person, place, and situation. Very pleasant and cooperative.    Labs and Imaging on Admission: I have personally reviewed following labs and imaging studies  CBC: Recent Labs  Lab 04/02/20 0202 04/02/20 0235  WBC 4.0  --   HGB 9.5* 10.2*  HCT 29.2* 30.0*  MCV 92.4  --   PLT 252  --    Basic Metabolic Panel: Recent Labs  Lab 04/02/20 0202 04/02/20 0235  NA 136 135  K 3.7 3.4*  CL 96* 95*  CO2 25  --   GLUCOSE 96 96  BUN 13 15  CREATININE 2.69* 2.90*  CALCIUM 8.4*  --    GFR: Estimated Creatinine Clearance: 16.3 mL/min (A) (by C-G formula based on SCr of 2.9 mg/dL (H)). Liver Function Tests: No results for input(s): AST, ALT, ALKPHOS, BILITOT, PROT, ALBUMIN in the last 168 hours. No results for input(s): LIPASE, AMYLASE in the last 168 hours. No results for  input(s): AMMONIA in the last 168 hours. Coagulation Profile: Recent Labs  Lab 04/02/20 0202  INR 1.0   Cardiac Enzymes: No results for input(s): CKTOTAL, CKMB, CKMBINDEX, TROPONINI in the last 168 hours. BNP (last 3 results) No results for input(s): PROBNP in the last 8760 hours. HbA1C: No results for input(s): HGBA1C in the last 72 hours. CBG: No results for input(s): GLUCAP in the last 168 hours. Lipid Profile: No results for input(s): CHOL, HDL, LDLCALC, TRIG, CHOLHDL, LDLDIRECT in the last 72 hours. Thyroid Function Tests: No results for input(s): TSH, T4TOTAL, FREET4, T3FREE, THYROIDAB in the last 72 hours. Anemia Panel: No results for input(s): VITAMINB12, FOLATE, FERRITIN, TIBC, IRON, RETICCTPCT in the last 72 hours. Urine analysis:    Component Value Date/Time   COLORURINE YELLOW 11/27/2019 1430   APPEARANCEUR CLEAR 11/27/2019 1430   LABSPEC 1.010 11/27/2019 1430   PHURINE 9.0 (H) 11/27/2019 1430   GLUCOSEU NEGATIVE 11/27/2019 1430   HGBUR NEGATIVE 11/27/2019 1430   BILIRUBINUR NEGATIVE 11/27/2019 1430   KETONESUR NEGATIVE 11/27/2019 1430   PROTEINUR NEGATIVE 11/27/2019 1430   UROBILINOGEN 1.0 10/05/2012 1953   NITRITE NEGATIVE 11/27/2019 1430   LEUKOCYTESUR SMALL (A) 11/27/2019 1430   Sepsis Labs: _0 (procalcitonin:4,lacticidven:4) ) Recent Results (from the past 240 hour(s))  SARS Coronavirus 2 by RT PCR (hospital order, performed in [facility] hospital lab) Nasopharyngeal Nasopharyngeal Swab     Status: None   Collection Time: 04/02/20  2:02 AM   Specimen: Nasopharyngeal Swab  Result Value Ref Range Status   SARS Coronavirus 2 NEGATIVE NEGATIVE Final    Comment: (NOTE) SARS-CoV-2 target nucleic acids are NOT DETECTED.  The SARS-CoV-2 RNA is generally detectable in upper and lower respiratory specimens during the acute phase of infection. The lowest concentration of SARS-CoV-2 viral copies this assay can detect is 250 copies / mL. A negative  result  does not preclude SARS-CoV-2 infection and should not be used as the sole basis for treatment or other patient management decisions.  A negative result may occur with improper specimen collection / handling, submission of specimen other than nasopharyngeal swab, presence of viral mutation(s) within the areas targeted by this assay, and inadequate number of viral copies (<250 copies / mL). A negative result must be combined with clinical observations, patient history, and epidemiological information.  Fact Sheet for Patients:   StrictlyIdeas.no  Fact Sheet for Healthcare Providers: BankingDealers.co.za  This test is not yet approved or  cleared by the [place] FDA and has been authorized for detection and/or diagnosis of SARS-CoV-2 by FDA under an Emergency Use Authorization (EUA).  This EUA will remain in effect (meaning this test can be used) for the duration of the COVID-19 declaration under Section 564(b)(1) of the Act, 21 U.S.C. section 360bbb-3(b)(1), unless the authorization is terminated or revoked sooner.  Performed at [facility], [place] [address], [place], [place] [address]      Radiological Exams on Admission: CT Angio Chest PE W and/or Wo Contrast  Result Date: 04/02/2020 CLINICAL DATA:  Shortness of breath EXAM: CT ANGIOGRAPHY CHEST WITH CONTRAST TECHNIQUE: Multidetector CT imaging of the chest was performed using the standard protocol during bolus administration of intravenous contrast. Multiplanar CT image reconstructions and MIPs were obtained to evaluate the vascular anatomy. CONTRAST:  1105m OMNIPAQUE IOHEXOL 350 MG/ML SOLN COMPARISON:  March 20, 2010 FINDINGS: Cardiovascular: There is a optimal opacification of the pulmonary arteries. There is no central,segmental, or subsegmental filling defects within the pulmonary arteries. There is moderate cardiomegaly. Coronary artery calcifications are seen. No  pericardial effusion or thickening. No evidence right heart strain. There is normal three-vessel brachiocephalic anatomy without proximal stenosis. Scattered aortic atherosclerosis is noted. Mediastinum/Nodes: No hilar, mediastinal, or axillary adenopathy. Thyroid gland, trachea, and esophagus demonstrate no significant findings. Lungs/Pleura: Minimal bibasilar atelectasis is seen. No large airspace consolidation is noted. No pleural effusion or pneumothorax. For Upper Abdomen: No acute abnormalities present in the visualized portions of the upper abdomen. Musculoskeletal: There is diffuse osteopenia. There heterogeneous lytic lesions seen scattered throughout the bilateral ribs and thoracic spine. No osseous fracture however is noted. Advanced bilateral shoulder osteoarthritis is noted. Review of the MIP images confirms the above findings. IMPRESSION: 1. No central, segmental, or subsegmental pulmonary embolism. 2. Moderate cardiomegaly. 3. No other acute intrathoracic pathology to explain the patient's symptoms. 4. Scattered lytic lesions throughout the visualized axial and appendicular skeleton, which could be due to metastatic process versus monoclonal gammopathy such is multiple myeloma Electronically Signed   By: BPrudencio PairM.D.   On: 04/02/2020 04:02   DG Chest Portable 1 View  Result Date: 04/02/2020 CLINICAL DATA:  Shortness of breath EXAM: PORTABLE CHEST 1 VIEW COMPARISON:  03/25/2020 FINDINGS: Left pacer and right dialysis catheter remain in place, unchanged. Cardiomegaly. Aortic atherosclerosis. Lungs clear. No effusions or edema. No acute bony abnormality. IMPRESSION: Cardiomegaly.  No active disease. Electronically Signed   By: KRolm BaptiseM.D.   On: 04/02/2020 02:06    EKG: Independently reviewed. Sinus rhythm, LBBB.   Assessment/Plan   1. Acute hypoxic respiratory failure   - Presents with acute-onset of SOB, was saturating 88% on rm air with EMS and had labored respirations   - CTA  chest negative for PE or other acute finding to explain sxs  - She is laying flat in ED now and appears comfortable, saturating 100% on 3 Lpm  with normal RR  - Continue to wean supplemental O2, this may have been a panic attack as the patient suspected though she was reportedly saturating 88% with EMS; consider echocardiogram if she continues to require supplemental O2    2. ESRD  - She reports completing HD on 6/17  - There is no acidosis, hyperkalemia, uremia, severe HTN, or hypervolemia  - SLIV, fluid-restrict, renally-dose medications, monitor    3. Multiple myeloma  - Follows with oncology, has not achieved remission, pharmacy medication-reconciliation pending    4. OSA  - Continue CPAP qHS    5. Elevated troponin  - Troponin is 101 in ED without chest pain, similar to prior values - ED physician discussed with cardiology, there is low-suspicion for ACS and no further workup needed unless 2nd troponin much higher    6. Hypertension  - Antihypertensives held recently after hypotension with syncope  - BP at goal in ED, pharmacy medication-reconciliation pending, treat only as needed for now     DVT prophylaxis: sq heparin  Code Status: Full  Family Communication: Discussed with patient  Disposition Plan:  Patient is from: Home  Anticipated d/c is to: TBD Anticipated d/c date is: 04/03/20 Patient currently: Has new supplemental O2 requirement  Consults called: None  Admission status: Observation    Vianne Bulls, MD [facility] Pager: See [email or web address]  If 7AM-7PM, please contact the daytime attending [email or web address]  04/02/2020, 5:02 AM

## 2020-04-02 NOTE — Consult Note (Signed)
Cardiology Consultation:   Patient ID: [name] MRN: [id number]; DOB: 1930/04/09  Admit date: 04/02/2020 Date of Consult: 04/02/2020  Primary Care Provider: Lucianne Lei, MD [facility] Cardiologist:  [facility] Electrophysiologist:  Thompson Grayer, MD    Patient Profile:   [name] is a 86 y.o. female with a hx of SSS with dual-chamber pacer followed by Dr. Rayann Heman, hypertension, diabetes type 2, OSA on CPAP, multiple myeloma not in remission, ESRD on dialysis and recent hypotension with syncope who is being seen today for the evaluation of abnormal EKG at the request of Dr Benny Lennert.  History of Present Illness:   [name] followed by Dr. Rayann Heman. She was last seen 03/29/2020 by Oda Kilts, PA-C for hypotension.  She had a previous episode of hypotension on 6/6 that resulted in syncope without injury seen by EMS that day as well as her oncologist.  BiDil was held.  Blood pressure remained low systolics [id number].  Blood pressure low on HD days (TTS).  Device was functioning normally.  BiDil was stopped  Presented to the ER 04/02/2020 for shortness of breath.  In her usual state of health and had completed dialysis. Son says she was mildly confused after HD. She ate and watched some TV. She went to sleep and does not remember if she put on her CPAP.  She woke up with acute shortness of breath and confusion. She tried to call her son (that sleeps upstairs) but could nor remember his number. Eventually got on the phone with him and all she could says was "I can't breath". She remember she was sat up but still felt her breathing was labored.  Denied chest pain, palpitations, fevers, chills, nausea, vomiting, diaphoresis, LLE. By the time the son got there the patient was standing up. EMS was called who found the patient to be saturating 88% on room air and was placed on a nonrebreather.  ED patient was weaned down to 4 L O2.  She was afebrile with stable blood pressure.  Labs  showed normal potassium, bicarb, BUN.  CBC with stable normocytic anemia.  BNP 261. HS troponin 101.  Covid negative.  Chest x-ray showed cardiomegaly with no acute disease. EKG showed AV paced rhythm with TWI in lateral leads.  CTA chest showed no PE, moderate cardiomegaly, no acute process, scattered lytic lesions possibly metastatic process.  Patient was admitted for further work-up.   Past Medical History:  Diagnosis Date  . Anemia   . Arthritis   . Benign paroxysmal positional vertigo 04/06/2010  . Benign positional vertigo   . 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 CAROTID BRUIT 02/24/2008  . Complication of anesthesia    took a long time to wake up with knee replacement  . DIABETES MELLITUS, TYPE II 02/24/2008  . ESRD (end stage renal disease) on dialysis ([place])    tues thurs sat [facility] sees [place] [facility]  . GERD (gastroesophageal reflux disease)   . Glaucoma   . Gout   . HYPERTENSION 02/24/2008  . Multiple myeloma ([place])   . Obstructive sleep apnea   . Pancytopenia ([place])   . Sick sinus syndrome ([place])   . Wears dentures   . Wears glasses     Past Surgical History:  Procedure Laterality Date  . A/V FISTULAGRAM Right 03/08/2020   Procedure: A/V FISTULAGRAM- Right Arm;  Surgeon: Waynetta Sandy, MD;  Location: [place] [facility];  Service: Cardiovascular;  Laterality: Right;  . ABDOMINAL HYSTERECTOMY  1980's  . AV FISTULA PLACEMENT Right 03/11/2019   Procedure: CREATION RIGHT ARM RADIOCEPHALIC ARTERIOVENOUS FISTULA;  Surgeon: Angelia Mould, MD;  Location: [place];  Service: Vascular;  Laterality: Right;  . AV FISTULA PLACEMENT Right 03/25/2020   Procedure: right arm ARTERIOVENOUS (AV) FISTULA CREATION;  Surgeon: Waynetta Sandy, MD;  Location: [place];  Service: Vascular;  Laterality: Right;  . BACK SURGERY     x 3  . BIOPSY  06/24/2019   Procedure: BIOPSY;  Surgeon: Ronald Lobo, MD;  Location: [facility];  Service: Endoscopy;;  . EP  IMPLANTABLE DEVICE N/A 05/16/2016   Procedure: PPM Generator Changeout;  Surgeon: Thompson Grayer, MD;  Location: [place] [facility];  Service: Cardiovascular;  Laterality: N/A;  . ESOPHAGOGASTRODUODENOSCOPY (EGD) WITH PROPOFOL N/A 06/24/2019   Procedure: ESOPHAGOGASTRODUODENOSCOPY (EGD) WITH PROPOFOL;  Surgeon: Ronald Lobo, MD;  Location: [facility];  Service: Endoscopy;  Laterality: N/A;  . FISTULA SUPERFICIALIZATION Right 03/11/2019   Procedure: FISTULA SUPERFICIALIZATION;  Surgeon: Angelia Mould, MD;  Location: [place];  Service: Vascular;  Laterality: Right;  . INSERTION OF DIALYSIS CATHETER Right 03/25/2020   Procedure: INSERTION OF DIALYSIS CATHETER, right internal jugular;  Surgeon: Waynetta Sandy, MD;  Location: [place];  Service: Vascular;  Laterality: Right;  . IR FLUORO GUIDE CV LINE RIGHT  03/07/2019  . IR US GUIDE VASC ACCESS RIGHT  03/07/2019  . KNEE SURGERY Right    2003  . KNEE SURGERY Left    2011  . PACEMAKER INSERTION       Home Medications:  Prior to Admission medications   Medication Sig Start Date End Date Taking? Authorizing Provider  acetaminophen (TYLENOL) 500 MG tablet Take 1,000 mg by mouth 2 (two) times daily as needed for moderate pain.    Yes [provider]  acyclovir (ZOVIRAX) 400 MG tablet TAKE 1 TABLET BY MOUTH EVERY DAY Patient taking differently: Take 400 mg by mouth daily.  08/11/19  Yes Gorsuch, Ni, MD  allopurinol (ZYLOPRIM) 100 MG tablet Take 0.5 tablets (50 mg total) by mouth daily. 03/12/19  Yes Nita Sells, MD  aspirin EC 81 MG tablet Take 81 mg by mouth daily. Swallow whole.   Yes [provider]  B Complex-C-Zn-Folic Acid (DIALYVITE 465-KCLE 15) 0.8 MG TABS Take 1 tablet by mouth every evening.  03/24/19  Yes [provider]  cholecalciferol (VITAMIN D3) 25 MCG (1000 UT) tablet Take 1,000 Units by mouth daily.    Yes [provider]  dexamethasone (DECADRON) 2 MG tablet Take 1 tablet (2 mg  total) by mouth every other day. 03/22/20  Yes Gorsuch, Ni, MD  dorzolamide (TRUSOPT) 2 % ophthalmic solution Place 1 drop into the left eye 2 (two) times daily.  12/21/17  Yes [provider]  fluorometholone (FML) 0.1 % ophthalmic suspension Place 1 drop into the right eye daily.  02/01/19  Yes [provider]  lactulose (CHRONULAC) 10 GM/15ML solution Take 10 g by mouth as needed for mild constipation.    Yes [provider]  lenalidomide (REVLIMID) 2.5 MG capsule Take 1 capsule three times a week after dialysis on Tuesday, Thursday and Saturdays 03/18/20  Yes Gorsuch, Ni, MD  LUMIGAN 0.01 % SOLN Place 1 drop into the left eye at bedtime.  02/01/19  Yes [provider]  LYRICA 50 MG capsule Take 50 mg by mouth at bedtime.  06/14/15  Yes [provider]  ondansetron (ZOFRAN) 4 MG tablet Take 1 tablet (4 mg total) by  mouth every 8 (eight) hours as needed for nausea. 03/14/19  Yes Gorsuch, Ni, MD  pantoprazole (PROTONIX) 40 MG tablet Take 1 tablet (40 mg total) by mouth daily as needed (acid reflux/indigestion.). 06/26/19  Yes Gorsuch, Ni, MD  polyethylene glycol (MIRALAX / GLYCOLAX) 17 g packet Take 17 g by mouth daily.    Yes [provider]  prochlorperazine (COMPAZINE) 5 MG tablet Take 1 tablet (5 mg total) by mouth every 6 (six) hours as needed for nausea or vomiting. 03/14/19  Yes Gorsuch, Ni, MD  senna-docusate (SENOKOT-S) 8.6-50 MG tablet Take 2 tablets by mouth 2 (two) times daily. 03/11/19  Yes Nita Sells, MD  sevelamer carbonate (RENVELA) 800 MG tablet Take 800-1,600 mg by mouth 3 (three) times daily with meals. Depending on meal size depends on the 1 or 2 tablets 04/25/19  Yes [provider]  sodium chloride (OCEAN) 0.65 % SOLN nasal spray Place 1 spray into both nostrils 4 (four) times daily as needed for congestion.    Yes [provider]  traMADol (ULTRAM) 50 MG tablet Take 1 tablet (50 mg total) by mouth every 8  (eight) hours as needed. 03/25/20  Yes Rhyne, Samantha J, PA-C  isosorbide-hydrALAZINE (BIDIL) 20-37.5 MG tablet Take 1 tablet by mouth 2 (two) times a day. Patient not taking: Reported on 04/02/2020 03/11/19   Nita Sells, MD    Inpatient Medications: Scheduled Meds: . acyclovir  400 mg Oral Daily  . allopurinol  50 mg Oral Daily  . aspirin EC  81 mg Oral Daily  . cholecalciferol  1,000 Units Oral Daily  . [START ON 04/03/2020] dexamethasone  2 mg Oral QODAY  . dorzolamide  1 drop Left Eye BID  . fluorometholone  1 drop Right Eye Daily  . heparin  5,000 Units Subcutaneous Q8H  . latanoprost  1 drop Left Eye QHS  . multivitamin  1 tablet Oral QPM  . polyethylene glycol  17 g Oral Daily  . pregabalin  50 mg Oral QHS  . senna-docusate  2 tablet Oral BID  . sevelamer carbonate  800-1,600 mg Oral TID WC  . sodium chloride flush  3 mL Intravenous Q12H  . sodium chloride flush  3 mL Intravenous Q12H   Continuous Infusions: . sodium chloride     PRN Meds: sodium chloride, acetaminophen **OR** acetaminophen, lactulose, lenalidomide, ondansetron **OR** ondansetron (ZOFRAN) IV, pantoprazole, prochlorperazine, senna-docusate, sodium chloride, sodium chloride flush, traMADol  Allergies:    Allergies  Allergen Reactions  . Aspirin Other (See Comments)    REACTION: STOMACH ISSUES WITH DOSE HIGHER THAN 81 MG  Other reaction(s): GI Upset (intolerance), Other (See Comments) REACTION: STOMACH ISSUES WITH DOSE HIGHER THAN 81 MG  REACTION: STOMACH ISSUES WITH DOSE HIGHER THAN 81 MG   . Hydrocodone-Acetaminophen Nausea And Vomiting  . Morphine Nausea And Vomiting  . Penicillins Rash    Patient took injection and tablets, had a reaction. She has taken amoxicillin with no reaction Has patient had a PCN reaction causing immediate rash, facial/tongue/throat swelling, SOB or lightheadedness with hypotension: Yes Has patient had a PCN reaction causing severe rash involving mucus membranes or  skin necrosis: Yes Has patient had a PCN reaction that required hospitalization No Has patient had a PCN reaction occurring within the last 10 years: No If all of the above answers are "NO", then m Patient took injection and tablets, had a reaction. She has taken amoxicillin with no reaction Patient took injection and tablets, had a reaction. She has taken amoxicillin  with no reaction Has patient had a PCN reaction causing immediate rash, facial/tongue/throat swelling, SOB or lightheadedness with hypotension: Yes Has patient had a PCN reaction causing severe rash involving mucus membranes or skin necrosis: Yes Has patient had a PCN reaction that required hospitalization No Has patient had a PCN reaction occurring within the last 10 years: No If all of the above answers are "NO", then m  . Betaine Nausea And Vomiting  . Dacarbazine Nausea And Vomiting  . Ketorolac Tromethamine Itching  . Brimonidine Itching  . Diltiazem Hcl Rash  . Hydrocodone-Acetaminophen Nausea And Vomiting  . Neurontin [Gabapentin] Nausea And Vomiting  . Sulfacetamide Sodium Rash    Social History:   Social History   Socioeconomic History  . Marital status: Married    Spouse name: Not on file  . Number of children: 5  . Years of education: Not on file  . Highest education level: Not on file  Occupational History  . Occupation: retired  Tobacco Use  . Smoking status: Never Smoker  . Smokeless tobacco: Never Used  Vaping Use  . Vaping Use: Never used  Substance and Sexual Activity  . Alcohol use: No  . Drug use: No  . Sexual activity: Not on file  Other Topics Concern  . Not on file  Social History Narrative   Retired Marine scientist   Social Determinants of Radio broadcast assistant Strain:   . Difficulty of Paying Living Expenses:   Food Insecurity:   . Worried About Charity fundraiser in the Last Year:   . Arboriculturist in the Last Year:   Transportation Needs:   . Film/video editor  (Medical):   Marland Kitchen Lack of Transportation (Non-Medical):   Physical Activity:   . Days of Exercise per Week:   . Minutes of Exercise per Session:   Stress:   . Feeling of Stress :   Social Connections:   . Frequency of Communication with Friends and Family:   . Frequency of Social Gatherings with Friends and Family:   . Attends Religious Services:   . Active Member of Clubs or Organizations:   . Attends Archivist Meetings:   Marland Kitchen Marital Status:   Intimate Partner Violence:   . Fear of Current or Ex-Partner:   . Emotionally Abused:   Marland Kitchen Physically Abused:   . Sexually Abused:     Family History:   Family History  Problem Relation Age of Onset  . Congestive Heart Failure Mother   . Tuberculosis Mother   . Multiple myeloma Mother   . Bone cancer Father   . Arthritis Father   . Breast cancer Sister   . Colon cancer Sister   . Hypertension Sister   . Dementia Sister   . Alcohol abuse Son      ROS:  Please see the history of present illness.  All other ROS reviewed and negative.     Physical Exam/Data:   Vitals:   04/02/20 1319 04/02/20 1320 04/02/20 1321 04/02/20 1322  BP:      Pulse:      Resp:      Temp:      TempSrc:      SpO2: 100% 100% 100% 100%   No intake or output data in the 24 hours ending 04/02/20 1325 Last 3 Weights 03/29/2020 03/25/2020 03/22/2020  Weight (lbs) 227 lb 3.2 oz 125 lb 12.8 oz 125 lb 12.8 oz  Weight (kg) 103.057 kg 57.063 kg 57.063 kg  There is no height or weight on file to calculate BMI.  General:  Well nourished, well developed, in no acute distress HEENT: normal Lymph: no adenopathy Neck: no JVD Endocrine:  No thryomegaly Vascular: No carotid bruits; FA pulses 2+ bilaterally without bruits  Cardiac:  normal S1, S2; RRR; + murmur  Lungs:  clear to auscultation bilaterally, no wheezing, rhonchi or rales  Abd: soft, nontender, no hepatomegaly  Ext: no edema Musculoskeletal:  No deformities, BUE and BLE strength normal and  equal Skin: warm and dry  Neuro:  CNs 2-12 intact, no focal abnormalities noted Psych:  Normal affect   EKG:  The EKG was personally reviewed and demonstrates: AV paced rhythm 70 bpm, TWI lateral leads Telemetry:  Telemetry was personally reviewed and demonstrates:  AV paced rhythm, HR 70s  Relevant CV Studies:  Echo 03/01/2019 1. The left ventricle has normal systolic function, with an ejection  fraction of 60-65%. The cavity size was normal. Left ventricular diastolic  function could not be evaluated due to nondiagnostic images. Indeterminate  filling pressures No evidence of  left ventricular regional wall motion abnormalities.  2. The right ventricle has normal systolc function. The cavity was  normal. There is no increase in right ventricular wall thickness. Right  ventricular systolic pressure is moderately elevated with an estimated  pressure of 45.7 mmHg.  3. Left atrial size was severely dilated.  4. The mitral valve is degenerative. Mild thickening of the anterior and  posterior mitral valve leaflets. There is mild mitral annular  calcification present.  5. Tricuspid valve regurgitation is mild-moderate.  6. The aortic valve is tricuspid Moderate sclerosis of the aortic valve.  Aortic valve regurgitation was not assessed by color flow Doppler.  7. Pulmonic valve regurgitation is mild by color flow Doppler.  8. The inferior vena cava was dilated in size with >50% respiratory  variability.   Laboratory Data:  High Sensitivity Troponin:   Recent Labs  Lab 04/02/20 0202 04/02/20 0418  TROPONINIHS 101* 99*     Chemistry Recent Labs  Lab 04/02/20 0202 04/02/20 0235  NA 136 135  K 3.7 3.4*  CL 96* 95*  CO2 25  --   GLUCOSE 96 96  BUN 13 15  CREATININE 2.69* 2.90*  CALCIUM 8.4*  --   GFRNONAA 15*  --   GFRAA 18*  --   ANIONGAP 15  --     No results for input(s): PROT, ALBUMIN, AST, ALT, ALKPHOS, BILITOT in the last 168 hours. Hematology Recent Labs    Lab 04/02/20 0202 04/02/20 0235  WBC 4.0  --   RBC 3.16*  --   HGB 9.5* 10.2*  HCT 29.2* 30.0*  MCV 92.4  --   MCH 30.1  --   MCHC 32.5  --   RDW 17.0*  --   PLT 252  --    BNP Recent Labs  Lab 04/02/20 0202  BNP 260.8*    DDimer No results for input(s): DDIMER in the last 168 hours.   Radiology/Studies:  CT Angio Chest PE W and/or Wo Contrast  Result Date: 04/02/2020 CLINICAL DATA:  Shortness of breath EXAM: CT ANGIOGRAPHY CHEST WITH CONTRAST TECHNIQUE: Multidetector CT imaging of the chest was performed using the standard protocol during bolus administration of intravenous contrast. Multiplanar CT image reconstructions and MIPs were obtained to evaluate the vascular anatomy. CONTRAST:  130m OMNIPAQUE IOHEXOL 350 MG/ML SOLN COMPARISON:  March 20, 2010 FINDINGS: Cardiovascular: There is a optimal opacification of the pulmonary arteries.  There is no central,segmental, or subsegmental filling defects within the pulmonary arteries. There is moderate cardiomegaly. Coronary artery calcifications are seen. No pericardial effusion or thickening. No evidence right heart strain. There is normal three-vessel brachiocephalic anatomy without proximal stenosis. Scattered aortic atherosclerosis is noted. Mediastinum/Nodes: No hilar, mediastinal, or axillary adenopathy. Thyroid gland, trachea, and esophagus demonstrate no significant findings. Lungs/Pleura: Minimal bibasilar atelectasis is seen. No large airspace consolidation is noted. No pleural effusion or pneumothorax. For Upper Abdomen: No acute abnormalities present in the visualized portions of the upper abdomen. Musculoskeletal: There is diffuse osteopenia. There heterogeneous lytic lesions seen scattered throughout the bilateral ribs and thoracic spine. No osseous fracture however is noted. Advanced bilateral shoulder osteoarthritis is noted. Review of the MIP images confirms the above findings. IMPRESSION: 1. No central, segmental, or  subsegmental pulmonary embolism. 2. Moderate cardiomegaly. 3. No other acute intrathoracic pathology to explain the patient's symptoms. 4. Scattered lytic lesions throughout the visualized axial and appendicular skeleton, which could be due to metastatic process versus monoclonal gammopathy such is multiple myeloma Electronically Signed   By: Prudencio Pair M.D.   On: 04/02/2020 04:02   DG Chest Portable 1 View  Result Date: 04/02/2020 CLINICAL DATA:  Shortness of breath EXAM: PORTABLE CHEST 1 VIEW COMPARISON:  03/25/2020 FINDINGS: Left pacer and right dialysis catheter remain in place, unchanged. Cardiomegaly. Aortic atherosclerosis. Lungs clear. No effusions or edema. No acute bony abnormality. IMPRESSION: Cardiomegaly.  No active disease. Electronically Signed   By: Rolm Baptise M.D.   On: 04/02/2020 02:06   CUP PACEART INCLINIC DEVICE CHECK  Result Date: 03/29/2020 Pacemaker check in clinic. Normal device function. Thresholds, sensing, impedances consistent with previous measurements. Device programmed to maximize longevity. AMS > 1%. PMT at times, managed by algorithm. No high ventricular rates noted. Device programmed at appropriate safety margins. Histogram distribution appropriate for patient activity level. Estimated longevity 6 yr, 9 mo. Patient enrolled in remote follow-ups. Next remote 05/07/2020. RTC 12 months.    Assessment and Plan:   Shortness of breath/acute hypoxic respiratory failure -Patient was found by EMS of 88% O2 on room air started on nonrebreather and wean down to 4 L in the ER -Chest x-ray with no acute process -CTA negative for PE or other acute finding -Fluid status managed with hemodialysis - Question as to whether the patient fell asleep without CPAP machine and had apneic episode. She reports she is feeling much better and is no longer confused on exam.  -Per primary team  Elevated troponin -Troponin 101> 99>160 -EKG shows AV paced rhythm, with TWI that appears  to be new. Went through old EKGs. Will review with MD. - Patient denies chest pain - No known history of CAD however CTA shows coronary artery calcifications.  - Will get an echo. Echo 02/2019 showed EF 60-65%, mild to mod TR. Pending echo results can decide if further ischemic work-up wants to be pursued.    SSS/second-degree AV block s/p Saint Jude PPM -Normal PPM function on last check  ESRD on HD - TTS  OSA -CPAP  For questions or updates, please contact [facility] Please consult [email or web address] for contact info under    Signed, [facility] Ninfa Meeker, PA-C  04/02/2020 1:25 PM

## 2020-04-02 NOTE — ED Notes (Signed)
Son [name] would like a status update asap.  Please call him @ [phone]

## 2020-04-02 NOTE — Plan of Care (Signed)
Plan of Care initiated 

## 2020-04-02 NOTE — Progress Notes (Signed)
[name] is a 86 yr old woman who presented to [facility] ED last night with complaints of shortness of breath. She has a past medical history significant for SSS with pacemaker, ESRD on HD (last HD 04/01/2020), OSA on CPAP, Multiple myeloma not in remission, hypotension with syncope.  CXR of the Chest demonstrated no cardio/pulmonary pathology, but CT of the chest did demonstrate some areas of pulmonary edema.   [facility] were consulted to admit the patient for further work up and treatment.   Initial EKG demonstrated sinus rhythm with a Left BBB. When telemetry seemed to show ST segment elavations repeat EKG was obtained that demonstrated no ST segment elevations, but a change to a junctional rhythm. Repeat troponins were ordered and a cardiology consult was obtained. Repeat troponins demonstrate an increase from 99 to 160.   Nephrology was also consulted.   Vitals: The patient is saturating 100% on 4L, BP 117/50, HR 72, RR 15.   The patient was comfortable. No complaints of chest pain. She was awake and alert. She was a little disoriented, but otherwise understood the situation. Heart and Lung exam were within normal limits. Abdomen was soft, non-tender, non-distended. No cyanosis, clubbing or edema.  She will be admitted to a step down bed.

## 2020-04-02 NOTE — ED Notes (Signed)
Lunch Tray Ordered @ 1049. °

## 2020-04-02 NOTE — ED Triage Notes (Signed)
Pt comes from home via [facility] for SOB that started tonight, initial stat was 88% on RA, improved with non-rebreather but work of breathing did not, so placed on CPAP. Pt is dialysis, T Thu and Sat, has not missed any treatments.

## 2020-04-02 NOTE — ED Provider Notes (Signed)
[facility] EMERGENCY DEPARTMENT Provider Note   CSN: [id number] Arrival date & time: 04/02/20  0148     History Chief Complaint  Patient presents with  . Shortness of Breath    [name] is a 86 y.o. female.  The history is provided by the EMS personnel. The history is limited by the condition of the patient.  Shortness of Breath Severity:  Severe Onset quality:  Sudden Timing:  Constant Progression:  Unchanged Chronicity:  New Context: not fumes   Relieved by:  Nothing Worsened by:  Nothing Ineffective treatments:  None tried Associated symptoms: no chest pain, no cough and no fever   Risk factors: no obesity        Past Medical History:  Diagnosis Date  . Anemia   . Arthritis   . Benign paroxysmal positional vertigo 04/06/2010  . Benign positional vertigo   . 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 CAROTID BRUIT 02/24/2008  . Complication of anesthesia    took a long time to wake up with knee replacement  . DIABETES MELLITUS, TYPE II 02/24/2008  . ESRD (end stage renal disease) on dialysis ([place])    tues thurs [facility] sees [place] [facility]  . GERD (gastroesophageal reflux disease)   . Glaucoma   . Gout   . HYPERTENSION 02/24/2008  . Multiple myeloma ([place])   . Obstructive sleep apnea   . Pancytopenia ([place])   . Sick sinus syndrome ([place])   . Wears dentures   . Wears glasses     Patient Active Problem List   Diagnosis Date Noted  . Hypotension 03/23/2020  . Left hip pain 01/12/2020  . Vitamin B12 deficiency 10/20/2019  . Deficiency anemia 09/26/2019  . Peripheral neuropathy due to chemotherapy ([place]) 07/04/2019  . Goals of care, counseling/discussion 05/30/2019  . Pressure injury of skin 05/26/2019  . Abdominal pain 05/25/2019  . Yeast infection of the skin 04/04/2019  . Cancer associated pain 03/26/2019  . Nausea in adult 03/14/2019  . ESRD (end stage renal disease) on dialysis ([place]) 03/14/2019  . Other constipation  03/14/2019  . Pancytopenia, acquired ([place]) 03/12/2019  . Multiple myeloma not having achieved remission ([place]) 03/03/2019  . AKI (acute kidney injury) ([place]) 02/25/2019  . Hypokalemia 02/25/2019  . Acute renal failure ([place])   . Anemia   . Chest pain 03/09/2015  . Shingles 03/09/2015  . Sick sinus syndrome ([place]) 02/03/2015  . Shortness of breath 02/01/2014  . Fatigue 02/01/2014  . BENIGN PAROXYSMAL POSITIONAL VERTIGO 04/06/2010  . BRADYCARDIA 01/27/2009  . Diabetes mellitus ([place]) 02/24/2008  . Obstructive sleep apnea 02/24/2008  . 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 Essential hypertension 02/24/2008  . ALLERGIC RHINITIS 02/24/2008  . CAROTID BRUIT 02/24/2008    Past Surgical History:  Procedure Laterality Date  . A/V FISTULAGRAM Right 03/08/2020   Procedure: A/V FISTULAGRAM- Right Arm;  Surgeon: Waynetta Sandy, MD;  Location: [facility];  Service: Cardiovascular;  Laterality: Right;  . ABDOMINAL HYSTERECTOMY     1980's  . AV FISTULA PLACEMENT Right 03/11/2019   Procedure: CREATION RIGHT ARM RADIOCEPHALIC ARTERIOVENOUS FISTULA;  Surgeon: Angelia Mould, MD;  Location: [facility];  Service: Vascular;  Laterality: Right;  . AV FISTULA PLACEMENT Right 03/25/2020   Procedure: right arm ARTERIOVENOUS (AV) FISTULA CREATION;  Surgeon: Waynetta Sandy, MD;  Location: [facility];  Service: Vascular;  Laterality: Right;  . BACK SURGERY     x 3  .  BIOPSY  06/24/2019   Procedure: BIOPSY;  Surgeon: Ronald Lobo, MD;  Location: [facility];  Service: Endoscopy;;  . EP IMPLANTABLE DEVICE N/A 05/16/2016   Procedure: PPM Generator Changeout;  Surgeon: Thompson Grayer, MD;  Location: [facility];  Service: Cardiovascular;  Laterality: N/A;  . ESOPHAGOGASTRODUODENOSCOPY (EGD) WITH PROPOFOL N/A 06/24/2019   Procedure: ESOPHAGOGASTRODUODENOSCOPY (EGD) WITH PROPOFOL;  Surgeon: Ronald Lobo, MD;  [facility];  Service: Endoscopy;  Laterality: N/A;  .  FISTULA SUPERFICIALIZATION Right 03/11/2019   Procedure: FISTULA SUPERFICIALIZATION;  Surgeon: Angelia Mould, MD;  Location: [facility];  Service: Vascular;  Laterality: Right;  . INSERTION OF DIALYSIS CATHETER Right 03/25/2020   Procedure: INSERTION OF DIALYSIS CATHETER, right internal jugular;  Surgeon: Waynetta Sandy, MD;  Location: [facility];  Service: Vascular;  Laterality: Right;  . IR FLUORO GUIDE CV LINE RIGHT  03/07/2019  . IR US GUIDE VASC ACCESS RIGHT  03/07/2019  . KNEE SURGERY Right    2003  . KNEE SURGERY Left    2011  . PACEMAKER INSERTION       OB History   No obstetric history on file.     Family History  Problem Relation Age of Onset  . Congestive Heart Failure Mother   . Tuberculosis Mother   . Multiple myeloma Mother   . Bone cancer Father   . Arthritis Father   . Breast cancer Sister   . Colon cancer Sister   . Hypertension Sister   . Dementia Sister   . Alcohol abuse Son     Social History   Tobacco Use  . Smoking status: Never Smoker  . Smokeless tobacco: Never Used  Vaping Use  . Vaping Use: Never used  Substance Use Topics  . Alcohol use: No  . Drug use: No    Home Medications Prior to Admission medications   Medication Sig Start Date End Date Taking? Authorizing Provider  acetaminophen (TYLENOL) 500 MG tablet Take 500 mg by mouth 2 (two) times daily as needed for moderate pain.    [provider]  acyclovir (ZOVIRAX) 400 MG tablet TAKE 1 TABLET BY MOUTH EVERY DAY 08/11/19   Heath Lark, MD  allopurinol (ZYLOPRIM) 100 MG tablet Take 0.5 tablets (50 mg total) by mouth daily. 03/12/19   Nita Sells, MD  B Complex-C-Zn-Folic Acid (DIALYVITE [id number] 15) 0.8 MG TABS Take 1 tablet by mouth every evening.  03/24/19   [provider]  cholecalciferol (VITAMIN D3) 25 MCG (1000 UT) tablet Take 1,000 Units by mouth daily.     [provider]  dexamethasone (DECADRON) 2 MG tablet Take 1 tablet (2 mg total) by  mouth every other day. 03/22/20   Heath Lark, MD  dorzolamide (TRUSOPT) 2 % ophthalmic solution Place 1 drop into the left eye 2 (two) times daily.  12/21/17   [provider]  fluorometholone (FML) 0.1 % ophthalmic suspension Place 1 drop into the right eye daily.  02/01/19   [provider]  isosorbide-hydrALAZINE (BIDIL) 20-37.5 MG tablet Take 1 tablet by mouth 2 (two) times a day. 03/11/19   Nita Sells, MD  lactulose (CHRONULAC) 10 GM/15ML solution Take by mouth as needed for mild constipation.    [provider]  lenalidomide (REVLIMID) 2.5 MG capsule Take 1 capsule three times a week after dialysis on Tuesday, Thursday and Saturdays 03/18/20   Heath Lark, MD  LUMIGAN 0.01 % SOLN Place 1 drop into the left eye at bedtime.  02/01/19  [provider]  LYRICA 50 MG capsule Take 50 mg by mouth at bedtime.  06/14/15   [provider]  ondansetron (ZOFRAN) 4 MG tablet Take 1 tablet (4 mg total) by mouth every 8 (eight) hours as needed for nausea. 03/14/19   Heath Lark, MD  pantoprazole (PROTONIX) 40 MG tablet Take 1 tablet (40 mg total) by mouth daily as needed (acid reflux/indigestion.). 06/26/19   Heath Lark, MD  polyethylene glycol (MIRALAX / GLYCOLAX) 17 g packet Take 17 g by mouth daily.     [provider]  prochlorperazine (COMPAZINE) 5 MG tablet Take 1 tablet (5 mg total) by mouth every 6 (six) hours as needed for nausea or vomiting. 03/14/19   Heath Lark, MD  senna-docusate (SENOKOT-S) 8.6-50 MG tablet Take 2 tablets by mouth 2 (two) times daily. 03/11/19   Nita Sells, MD  sevelamer carbonate (RENVELA) 800 MG tablet Take 800-1,600 mg by mouth 3 (three) times daily with meals. Depending on meal size depends on the 1 or 2 tablets 04/25/19   [provider]  sodium chloride (OCEAN) 0.65 % SOLN nasal spray Place 1 spray into both nostrils 4 (four) times daily as needed for congestion.     [provider]  traMADol  (ULTRAM) 50 MG tablet Take 1 tablet (50 mg total) by mouth every 8 (eight) hours as needed. 03/25/20   Rhyne, Hulen Shouts, PA-C    Allergies    Aspirin, Hydrocodone-acetaminophen, Morphine, Penicillins, Betaine, Dacarbazine, Ketorolac tromethamine, Brimonidine, Diltiazem hcl, Hydrocodone-acetaminophen, Neurontin [gabapentin], and Sulfacetamide sodium  Review of Systems   Review of Systems  Unable to perform ROS: Acuity of condition  Constitutional: Negative for fever.  HENT: Negative for congestion.   Respiratory: Positive for shortness of breath. Negative for cough.   Cardiovascular: Negative for chest pain and leg swelling.    Physical Exam Updated Vital Signs BP 137/63   Pulse 74   Temp (!) 97.2 F (36.2 C) (Temporal)   Resp (!) 29   SpO2 100%   Physical Exam Vitals and nursing note reviewed.  Constitutional:      Appearance: Normal appearance. She is not diaphoretic.  HENT:     Head: Normocephalic and atraumatic.     Nose: Nose normal.  Eyes:     Conjunctiva/sclera: Conjunctivae normal.     Pupils: Pupils are equal, round, and reactive to light.  Cardiovascular:     Rate and Rhythm: Normal rate and regular rhythm.     Pulses: Normal pulses.     Heart sounds: Normal heart sounds.  Pulmonary:     Effort: Tachypnea present.     Breath sounds: Rales present.  Abdominal:     General: Abdomen is flat. Bowel sounds are normal.     Tenderness: There is no abdominal tenderness. There is no guarding or rebound.  Musculoskeletal:        General: Normal range of motion.     Cervical back: Normal range of motion and neck supple.     Right lower leg: No edema.     Left lower leg: No edema.  Skin:    General: Skin is warm and dry.     Capillary Refill: Capillary refill takes less than 2 seconds.  Neurological:     General: No focal deficit present.     Mental Status: She is alert.     Deep Tendon Reflexes: Reflexes normal.  Psychiatric:        Mood and Affect: Mood normal.  Behavior: Behavior normal.     ED Results / Procedures / Treatments   Labs (all labs ordered are listed, but only abnormal results are displayed) Results for orders placed or performed during the hospital encounter of 04/02/20  CBC  Result Value Ref Range   WBC 4.0 4.0 - 10.5 K/uL   RBC 3.16 (L) 3.87 - 5.11 MIL/uL   Hemoglobin 9.5 (L) 12.0 - 15.0 g/dL   HCT 29.2 (L) 36 - 46 %   MCV 92.4 80.0 - 100.0 fL   MCH 30.1 26.0 - 34.0 pg   MCHC 32.5 30.0 - 36.0 g/dL   RDW 17.0 (H) 11.5 - 15.5 %   Platelets 252 150 - 400 K/uL   nRBC 0.0 0.0 - 0.2 %  Protime-INR  Result Value Ref Range   Prothrombin Time 12.7 11.4 - 15.2 seconds   INR 1.0 0.8 - 1.2  I-stat chem 8, ED (not at [facility] or [facility])  Result Value Ref Range   Sodium 135 135 - 145 mmol/L   Potassium 3.4 (L) 3.5 - 5.1 mmol/L   Chloride 95 (L) 98 - 111 mmol/L   BUN 15 8 - 23 mg/dL   Creatinine, Ser 2.90 (H) 0.44 - 1.00 mg/dL   Glucose, Bld 96 70 - 99 mg/dL   Calcium, Ion 0.95 (L) 1.15 - 1.40 mmol/L   TCO2 29 22 - 32 mmol/L   Hemoglobin 10.2 (L) 12.0 - 15.0 g/dL   HCT 30.0 (L) 36 - 46 %   DG Chest 2 View  Result Date: 03/21/2020 CLINICAL DATA:  Right shoulder pain and nausea. EXAM: CHEST - 2 VIEW COMPARISON:  May 25, 2019 FINDINGS: A dual lead AICD is noted. There is no evidence of acute infiltrate, pleural effusion or pneumothorax. The heart size and mediastinal contours are within normal limits. There is moderate severity calcification of the aortic arch. Marked severity degenerative changes seen throughout the bilateral shoulders and thoracic spine. IMPRESSION: No active cardiopulmonary disease. Electronically Signed   By: Virgina Norfolk M.D.   On: 03/21/2020 16:13   DG Shoulder Right  Result Date: 03/21/2020 CLINICAL DATA:  Right shoulder pain. EXAM: RIGHT SHOULDER - 2+ VIEW COMPARISON:  None. FINDINGS: There is no evidence of an acute fracture or dislocation. Marked severity chronic and degenerative changes are seen  involving the glenohumeral articulation and adjacent portions of the right humeral head and right glenoid. Chronic changes are also seen along the greater tubercle of the right humeral head. Soft tissues are unremarkable. IMPRESSION: Marked severity chronic and degenerative changes without evidence of acute osseous abnormality. Electronically Signed   By: Virgina Norfolk M.D.   On: 03/21/2020 16:11   CT Head Wo Contrast  Result Date: 03/21/2020 CLINICAL DATA:  Neck pain. Neck pain and RIGHT shoulder pain just prior to syncope. Patient did not fall. RIGHT shoulder pain and nausea. EXAM: CT HEAD WITHOUT CONTRAST CT CERVICAL SPINE WITHOUT CONTRAST TECHNIQUE: Multidetector CT imaging of the head and cervical spine was performed following the standard protocol without intravenous contrast. Multiplanar CT image reconstructions of the cervical spine were also generated. COMPARISON:  CT head on 03/08/2015, CT of the cervical spine on 02/23/2015 FINDINGS: CT HEAD FINDINGS Brain: There is mild central and cortical atrophy. There is no intra or extra-axial fluid collection or mass lesion. The basilar cisterns and ventricles have a normal appearance. There is no CT evidence for acute infarction or hemorrhage. Vascular: There is dense calcification of the internal carotid arteries, associated with tortuosity. No  hyperdense vessels. Skull: Hyperostosis frontalis. There are new circumscribed lytic lesions within the RIGHT frontal bone, RIGHT parietal bone, LEFT parietal bone consistent with metastatic disease versus myeloma. There is fluid within a previously aerated RIGHT petrous ridge air cell. Sinuses/Orbits: No acute finding. Other: None CT CERVICAL SPINE FINDINGS Alignment: There is 3 millimeters anterolisthesis of C4 on C5. There is 4 millimeters anterolisthesis of C7 on T1. Skull base and vertebrae: Numerous new lytic lesions are identified throughout the cervical spine, involving vertebral bodies as well as posterior  elements. Largest lesion is identified at T1. Lesion involves an expanded LEFT transverse process and extends into the vertebral body. There is significant associated destruction of the cortex of the transverse process. Similar lesions are identified within the proximal ribs. There is no definite evidence for acute fracture. Soft tissues and spinal canal: No prevertebral fluid or swelling. No visible canal hematoma. Disc levels: Significant disc height loss and uncovertebral spurring primarily at C4-5, C5-6, C6-7. Upper chest: Lung apices are clear. Other: Carotid artery calcification. IMPRESSION: 1. No evidence for acute intracranial abnormality.  Atrophy. 2. Interval development of lytic lesions within the skull, consistent metastatic disease or myeloma. 3. Numerous new lytic lesions throughout the cervical spine and proximal ribs consistent with metastatic disease versus myeloma. 4. Expanded LEFT transverse process of T1 is associated with cortical destruction, and possibly accounts for the patient's pain. Recommend further evaluation with MRI of the cervical spine. 5. Significant degenerative changes. 6. No evidence for acute cervical spine abnormality. These results were called by telephone at the time of interpretation on 03/21/2020 at 4:14 pm to provider [facility] , who verbally acknowledged these results. Electronically Signed   By: Nolon Nations M.D.   On: 03/21/2020 16:15   CT Cervical Spine Wo Contrast  Result Date: 03/21/2020 CLINICAL DATA:  Neck pain. Neck pain and RIGHT shoulder pain just prior to syncope. Patient did not fall. RIGHT shoulder pain and nausea. EXAM: CT HEAD WITHOUT CONTRAST CT CERVICAL SPINE WITHOUT CONTRAST TECHNIQUE: Multidetector CT imaging of the head and cervical spine was performed following the standard protocol without intravenous contrast. Multiplanar CT image reconstructions of the cervical spine were also generated. COMPARISON:  CT head on 03/08/2015, CT of the  cervical spine on 02/23/2015 FINDINGS: CT HEAD FINDINGS Brain: There is mild central and cortical atrophy. There is no intra or extra-axial fluid collection or mass lesion. The basilar cisterns and ventricles have a normal appearance. There is no CT evidence for acute infarction or hemorrhage. Vascular: There is dense calcification of the internal carotid arteries, associated with tortuosity. No hyperdense vessels. Skull: Hyperostosis frontalis. There are new circumscribed lytic lesions within the RIGHT frontal bone, RIGHT parietal bone, LEFT parietal bone consistent with metastatic disease versus myeloma. There is fluid within a previously aerated RIGHT petrous ridge air cell. Sinuses/Orbits: No acute finding. Other: None CT CERVICAL SPINE FINDINGS Alignment: There is 3 millimeters anterolisthesis of C4 on C5. There is 4 millimeters anterolisthesis of C7 on T1. Skull base and vertebrae: Numerous new lytic lesions are identified throughout the cervical spine, involving vertebral bodies as well as posterior elements. Largest lesion is identified at T1. Lesion involves an expanded LEFT transverse process and extends into the vertebral body. There is significant associated destruction of the cortex of the transverse process. Similar lesions are identified within the proximal ribs. There is no definite evidence for acute fracture. Soft tissues and spinal canal: No prevertebral fluid or swelling. No visible canal hematoma. Disc levels: Significant disc  height loss and uncovertebral spurring primarily at C4-5, C5-6, C6-7. Upper chest: Lung apices are clear. Other: Carotid artery calcification. IMPRESSION: 1. No evidence for acute intracranial abnormality.  Atrophy. 2. Interval development of lytic lesions within the skull, consistent metastatic disease or myeloma. 3. Numerous new lytic lesions throughout the cervical spine and proximal ribs consistent with metastatic disease versus myeloma. 4. Expanded LEFT transverse  process of T1 is associated with cortical destruction, and possibly accounts for the patient's pain. Recommend further evaluation with MRI of the cervical spine. 5. Significant degenerative changes. 6. No evidence for acute cervical spine abnormality. These results were called by telephone at the time of interpretation on 03/21/2020 at 4:14 pm to provider [facility] , who verbally acknowledged these results. Electronically Signed   By: Nolon Nations M.D.   On: 03/21/2020 16:15   PERIPHERAL VASCULAR CATHETERIZATION  Result Date: 03/08/2020 Patient name: KELLEY POLINSKY MRN: [id number] DOB: [birth year] Sex: female 03/08/2020 Pre-operative Diagnosis: esrd, malfunction of right arm avf Post-operative diagnosis:  Same Surgeon:  Eda Paschal. Donzetta Matters, MD Procedure Performed: 1.  Ultrasound-guided cannulation right arm AV fistula 2.  Right upper extremity shuntogram Indications: 86 year old female on dialysis via right arm fistula.  She has had difficulty with clearance.  She is now indicated for fistulogram possible invention. Findings: The fistula at the anastomosis is actually quite diseased.  There is runoff via the cephalic and basilic vein but these are diminutive there is no flow-limiting stenosis. Patient will be set up for right upper arm AV graft versus conversion upper arm AV fistula and will need catheter at the same time.  Procedure:  The patient was identified in the holding area and taken to room 8 where she was placed upon operative table right arm was prepped and draped timeout was called.  Ultrasound was used to identify the cephalic vein in the mid forearm.  This appeared quite diseased the anastomosis was patent.  The area was anesthetized with 1% lidocaine.  We cannulated with direct ultrasound visualization a micropuncture needle followed wire sheath.  Images saved the permanent record.  We performed right upper extremity shuntogram.  We attempted to perform retrograde views but could not compress  given the disease of the fistula.  I elected patient will need conversion upper arm access and a catheter.  I discussed this with the patient.  She tolerated procedure without any complication. Contrast: 40cc Brandon C. Donzetta Matters, MD [facility] Office: [phone] Pager: [phone]  DG Chest Portable 1 View  Result Date: 04/02/2020 CLINICAL DATA:  Shortness of breath EXAM: PORTABLE CHEST 1 VIEW COMPARISON:  03/25/2020 FINDINGS: Left pacer and right dialysis catheter remain in place, unchanged. Cardiomegaly. Aortic atherosclerosis. Lungs clear. No effusions or edema. No acute bony abnormality. IMPRESSION: Cardiomegaly.  No active disease. Electronically Signed   By: Rolm Baptise M.D.   On: 04/02/2020 02:06   DG Chest Port 1 View  Result Date: 03/25/2020 CLINICAL DATA:  Dialysis catheter placement, protocol evaluation for pneumothorax EXAM: PORTABLE CHEST 1 VIEW COMPARISON:  03/21/2020 FINDINGS: Single frontal view of the chest demonstrates right-sided dialysis catheter placement via subclavian approach, tip overlying superior vena cava. Dual lead pacer unchanged. Cardiac silhouette is enlarged but stable. No airspace disease, effusion, or pneumothorax. Stable degenerative changes of the shoulders. IMPRESSION: 1. No complication after right-sided dialysis catheter placement. The Electronically Signed   By: Randa Ngo M.D.   On: 03/25/2020 15:26   CUP PACEART INCLINIC DEVICE CHECK  Result Date: 03/29/2020  Pacemaker check in clinic. Normal device function. Thresholds, sensing, impedances consistent with previous measurements. Device programmed to maximize longevity. AMS > 1%. PMT at times, managed by algorithm. No high ventricular rates noted. Device programmed at appropriate safety margins. Histogram distribution appropriate for patient activity level. Estimated longevity 6 yr, 9 mo. Patient enrolled in remote follow-ups. Next remote 05/07/2020. RTC 12 months.  HYBRID OR  IMAGING ([facility] ONLY)  Result Date: 03/25/2020 There is no interpretation for this exam.  This order is for images obtained during a surgical procedure.  Please See "Surgeries" Tab for more information regarding the procedure.    EKG Sinus with LBBB see Epic  Radiology DG Chest Portable 1 View  Result Date: 04/02/2020 CLINICAL DATA:  Shortness of breath EXAM: PORTABLE CHEST 1 VIEW COMPARISON:  03/25/2020 FINDINGS: Left pacer and right dialysis catheter remain in place, unchanged. Cardiomegaly. Aortic atherosclerosis. Lungs clear. No effusions or edema. No acute bony abnormality. IMPRESSION: Cardiomegaly.  No active disease. Electronically Signed   By: Rolm Baptise M.D.   On: 04/02/2020 02:06    Procedures Procedures (including critical care time)  Medications Ordered in ED Medications - No data to display  ED Course  I have reviewed the triage vital signs and the nursing notes.  Pertinent labs & imaging results that were available during my care of the patient were reviewed by me and considered in my medical decision making (see chart for details).   345 Case d/w Cardiology.  Cycle troponins. DO not start heparin at this time.  Agrees with PE rule out.     MDM Rules/Calculators/A&P                          Admit to medicine.   Final Clinical Impression(s) / ED Diagnoses Admit to medicine    Jla Reynolds, MD 04/02/20 3014

## 2020-04-02 NOTE — Progress Notes (Signed)
  Echocardiogram 2D Echocardiogram has been performed.  [name] 04/02/2020, 5:44 PM

## 2020-04-02 NOTE — ED Notes (Signed)
[name], son, [phone] would like an update when available

## 2020-04-02 NOTE — ED Notes (Signed)
Cardiology at bedside, aware of latest troponin level

## 2020-04-02 NOTE — ED Notes (Signed)
Tele  Breakfast Ordered 

## 2020-04-02 NOTE — Progress Notes (Signed)
RT placed patient on CPAP HS. 2L O2 bleed in needed. Patient tolerating well at this time. 

## 2020-04-02 NOTE — ED Notes (Signed)
EKG changes noted.  2nd EKG shot and uploaded.  MD notified.

## 2020-04-03 LAB — HEPATITIS B SURFACE ANTIGEN: Hepatitis B Surface Ag: NONREACTIVE

## 2020-04-03 LAB — GLUCOSE, CAPILLARY
Glucose-Capillary: 78 mg/dL (ref 70–99)
Glucose-Capillary: 72 mg/dL (ref 70–99)
Glucose-Capillary: 133 mg/dL — ABNORMAL HIGH (ref 70–99)
Glucose-Capillary: 108 mg/dL — ABNORMAL HIGH (ref 70–99)
Glucose-Capillary: 84 mg/dL (ref 70–99)

## 2020-04-03 LAB — BASIC METABOLIC PANEL
Anion gap: 13 (ref 5–15)
BUN: 27 mg/dL — ABNORMAL HIGH (ref 8–23)
CO2: 26 mmol/L (ref 22–32)
Calcium: 8.7 mg/dL — ABNORMAL LOW (ref 8.9–10.3)
Chloride: 97 mmol/L — ABNORMAL LOW (ref 98–111)
Creatinine, Ser: 4.33 mg/dL — ABNORMAL HIGH (ref 0.44–1.00)
GFR calc Af Amer: 10 mL/min — ABNORMAL LOW (ref >60)
GFR calc non Af Amer: 9 mL/min — ABNORMAL LOW (ref >60)
Glucose, Bld: 71 mg/dL (ref 70–99)
Potassium: 4 mmol/L (ref 3.5–5.1)
Sodium: 136 mmol/L (ref 135–145)

## 2020-04-03 LAB — CBC
HCT: 28.8 % — ABNORMAL LOW (ref 36.0–46.0)
Hemoglobin: 9.5 g/dL — ABNORMAL LOW (ref 12.0–15.0)
MCH: 30.2 pg (ref 26.0–34.0)
MCHC: 33 g/dL (ref 30.0–36.0)
MCV: 91.4 fL (ref 80.0–100.0)
Platelets: 202 10*3/uL (ref 150–400)
RBC: 3.15 MIL/uL — ABNORMAL LOW (ref 3.87–5.11)
RDW: 17.2 % — ABNORMAL HIGH (ref 11.5–15.5)
WBC: 5 10*3/uL (ref 4.0–10.5)
nRBC: 0 % (ref 0.0–0.2)

## 2020-04-03 MED ORDER — COSYNTROPIN 0.25 MG IJ SOLR
0.2500 mg | Freq: Once | INTRAMUSCULAR | Status: AC
  Administered 2020-04-04: 0.25 mg via INTRAVENOUS
  Filled 2020-04-03: qty 0.25

## 2020-04-03 MED ORDER — HEPARIN SODIUM (PORCINE) 1000 UNIT/ML IJ SOLN
INTRAMUSCULAR | Status: AC
  Administered 2020-04-03: 1000 [IU]
  Filled 2020-04-03: qty 6

## 2020-04-03 NOTE — Progress Notes (Signed)
Pt brought revlimid 2.5mg  from home to take after dialysis  Pharmacy contacted  md notified  Medication given at 1700  Unable to enter into mar  Remainder of bottle sent to pharmacy Documentation in chart

## 2020-04-03 NOTE — Progress Notes (Signed)
Progress Note  Patient Name: [name] Date of Encounter: 04/03/2020  [facility] Cardiologist: Jenkins Rouge, MD   Subjective   Currently feeling well without further episodes of confusion  Inpatient Medications    Scheduled Meds: . acyclovir  400 mg Oral Daily  . allopurinol  50 mg Oral Daily  . aspirin EC  81 mg Oral Daily  . Chlorhexidine Gluconate Cloth  6 each Topical Daily  . cholecalciferol  1,000 Units Oral Daily  . dexamethasone  2 mg Oral QODAY  . dorzolamide  1 drop Left Eye BID  . fluorometholone  1 drop Right Eye Daily  . heparin  5,000 Units Subcutaneous Q8H  . insulin aspart  0-6 Units Subcutaneous TID WC  . latanoprost  1 drop Left Eye QHS  . multivitamin  1 tablet Oral QPM  . polyethylene glycol  17 g Oral Daily  . pregabalin  50 mg Oral QHS  . senna-docusate  2 tablet Oral BID  . sevelamer carbonate  800-1,600 mg Oral TID WC  . sodium chloride flush  3 mL Intravenous Q12H  . sodium chloride flush  3 mL Intravenous Q12H   Continuous Infusions: . sodium chloride     PRN Meds: sodium chloride, acetaminophen **OR** acetaminophen, lactulose, lenalidomide, ondansetron **OR** ondansetron (ZOFRAN) IV, pantoprazole, prochlorperazine, senna-docusate, sodium chloride, sodium chloride flush, traMADol   Vital Signs    Vitals:   04/03/20 1000 04/03/20 1030 04/03/20 1100 04/03/20 1135  BP: (!) 90/50 (!) 88/50 (!) 85/40 (!) 104/47  Pulse: 70 72 70   Resp: 12 16    Temp:    98.2 F (36.8 C)  TempSrc:    Oral  SpO2:      Weight:    54 kg    Intake/Output Summary (Last 24 hours) at 04/03/2020 1211 Last data filed at 04/03/2020 1135 Gross per 24 hour  Intake 240 ml  Output 500 ml  Net -260 ml   Last 3 Weights 04/03/2020 04/03/2020 04/03/2020  Weight (lbs) 119 lb 0.8 oz 119 lb 11.4 oz 120 lb  Weight (kg) 54 kg 54.3 kg 54.432 kg      Telemetry    AV paced- Personally Reviewed  ECG    AV paced- Personally Reviewed  Physical Exam   GEN: No  acute distress.   Neck: No JVD Cardiac: RRR, no murmurs, rubs, or gallops.  Respiratory: Clear to auscultation bilaterally. GI: Soft, nontender, non-distended  MS: No edema; No deformity. Neuro:  Nonfocal  Psych: Normal affect   Labs    High Sensitivity Troponin:   Recent Labs  Lab 04/02/20 0202 04/02/20 0418 04/02/20 1218 04/02/20 1230  TROPONINIHS 101* 99* 172* 160*      Chemistry Recent Labs  Lab 04/02/20 0202 04/02/20 0235 04/03/20 0521  NA 136 135 136  K 3.7 3.4* 4.0  CL 96* 95* 97*  CO2 25  --  26  GLUCOSE 96 96 71  BUN 13 15 27*  CREATININE 2.69* 2.90* 4.33*  CALCIUM 8.4*  --  8.7*  GFRNONAA 15*  --  9*  GFRAA 18*  --  10*  ANIONGAP 15  --  13     Hematology Recent Labs  Lab 04/02/20 0202 04/02/20 0235 04/03/20 0521  WBC 4.0  --  5.0  RBC 3.16*  --  3.15*  HGB 9.5* 10.2* 9.5*  HCT 29.2* 30.0* 28.8*  MCV 92.4  --  91.4  MCH 30.1  --  30.2  MCHC 32.5  --  33.0  RDW 17.0*  --  17.2*  PLT 252  --  202    BNP Recent Labs  Lab 04/02/20 0202  BNP 260.8*     DDimer No results for input(s): DDIMER in the last 168 hours.   Radiology    CT Angio Chest PE W and/or Wo Contrast  Result Date: 04/02/2020 CLINICAL DATA:  Shortness of breath EXAM: CT ANGIOGRAPHY CHEST WITH CONTRAST TECHNIQUE: Multidetector CT imaging of the chest was performed using the standard protocol during bolus administration of intravenous contrast. Multiplanar CT image reconstructions and MIPs were obtained to evaluate the vascular anatomy. CONTRAST:  145m OMNIPAQUE IOHEXOL 350 MG/ML SOLN COMPARISON:  March 20, 2010 FINDINGS: Cardiovascular: There is a optimal opacification of the pulmonary arteries. There is no central,segmental, or subsegmental filling defects within the pulmonary arteries. There is moderate cardiomegaly. Coronary artery calcifications are seen. No pericardial effusion or thickening. No evidence right heart strain. There is normal three-vessel brachiocephalic  anatomy without proximal stenosis. Scattered aortic atherosclerosis is noted. Mediastinum/Nodes: No hilar, mediastinal, or axillary adenopathy. Thyroid gland, trachea, and esophagus demonstrate no significant findings. Lungs/Pleura: Minimal bibasilar atelectasis is seen. No large airspace consolidation is noted. No pleural effusion or pneumothorax. For Upper Abdomen: No acute abnormalities present in the visualized portions of the upper abdomen. Musculoskeletal: There is diffuse osteopenia. There heterogeneous lytic lesions seen scattered throughout the bilateral ribs and thoracic spine. No osseous fracture however is noted. Advanced bilateral shoulder osteoarthritis is noted. Review of the MIP images confirms the above findings. IMPRESSION: 1. No central, segmental, or subsegmental pulmonary embolism. 2. Moderate cardiomegaly. 3. No other acute intrathoracic pathology to explain the patient's symptoms. 4. Scattered lytic lesions throughout the visualized axial and appendicular skeleton, which could be due to metastatic process versus monoclonal gammopathy such is multiple myeloma Electronically Signed   By: BPrudencio PairM.D.   On: 04/02/2020 04:02   DG Chest Portable 1 View  Result Date: 04/02/2020 CLINICAL DATA:  Shortness of breath EXAM: PORTABLE CHEST 1 VIEW COMPARISON:  03/25/2020 FINDINGS: Left pacer and right dialysis catheter remain in place, unchanged. Cardiomegaly. Aortic atherosclerosis. Lungs clear. No effusions or edema. No acute bony abnormality. IMPRESSION: Cardiomegaly.  No active disease. Electronically Signed   By: KRolm BaptiseM.D.   On: 04/02/2020 02:06   ECHOCARDIOGRAM COMPLETE  Result Date: 04/02/2020    ECHOCARDIOGRAM REPORT   Patient Name:   [name] Date of Exam: 04/02/2020 Medical Rec #:  [id number]       Height:       65.0 in Accession #:    [id number]      Weight:       227.2 lb Date of Birth:  110-Aug-1931      BSA:          2.088 m Patient Age:    89years         BP:            115/52 mmHg Patient Gender: F                HR:           70 bpm. Exam Location:  Inpatient Procedure: 2D Echo, Cardiac Doppler and Color Doppler Indications:    Elevated troponin  History:        Patient has prior history of Echocardiogram examinations, most                 recent 03/01/2019. Pacemaker, Signs/Symptoms:Shortness of  Breath;                 Risk Factors:Hypertension, Sleep Apnea and Diabetes. ESRD.  Sonographer:    Clayton Lefort RDCS (AE) Referring Phys: [id number] [place]  1. There is significant apical-basal and septal-lateral pacing-induced dyssynchrony. Left ventricular ejection fraction, by estimation, is 40 to 45%. The left ventricle has mildly decreased function. The left ventricle demonstrates regional wall motion abnormalities (see scoring diagram/findings for description). There is moderate concentric left ventricular hypertrophy. Left ventricular diastolic parameters are consistent with Grade II diastolic dysfunction (pseudonormalization). Elevated left atrial pressure. There is moderate dyskinesis of the left ventricular, entire apical segment, although this may be artifactual (pacing related).  2. Right ventricular systolic function is normal. The right ventricular size is normal. Mildly increased right ventricular wall thickness. There is mildly elevated pulmonary artery systolic pressure. The estimated right ventricular systolic pressure is 05.6 mmHg.  3. Left atrial size was mildly dilated.  4. Right atrial size was mildly dilated.  5. The mitral valve is normal in structure. Mild mitral valve regurgitation.  6. Tricuspid valve regurgitation is mild to moderate.  7. The aortic valve is tricuspid. Aortic valve regurgitation is trivial. Very mild aortic valve stenosis.  8. The inferior vena cava is normal in size with greater than 50% respiratory variability, suggesting right atrial pressure of 3 mmHg. Comparison(s): Prior images reviewed side by side. The left  ventricular function is worsened. The difference is at least in part (possibly entirely) due to ventricular pacing (which was present only intermittently on the 03/01/2019 study, but is seen constantly throughout the current study). FINDINGS  Left Ventricle: There is significant apical-basal and septal-lateral pacing-induced dyssynchrony. Left ventricular ejection fraction, by estimation, is 40 to 45%. The left ventricle has mildly decreased function. The left ventricle demonstrates regional  wall motion abnormalities. Moderate dyskinesis of the left ventricular, entire apical segment. The left ventricular internal cavity size was normal in size. There is moderate concentric left ventricular hypertrophy. Abnormal (paradoxical) septal motion,  consistent with RV pacemaker. Left ventricular diastolic parameters are consistent with Grade II diastolic dysfunction (pseudonormalization). Elevated left atrial pressure. Right Ventricle: The right ventricular size is normal. Mildly increased right ventricular wall thickness. Right ventricular systolic function is normal. There is mildly elevated pulmonary artery systolic pressure. The tricuspid regurgitant velocity is 3.02 m/s, and with an assumed right atrial pressure of 3 mmHg, the estimated right ventricular systolic pressure is 97.9 mmHg. Left Atrium: Left atrial size was mildly dilated. Right Atrium: Right atrial size was mildly dilated. Pericardium: A small pericardial effusion is present. The pericardial effusion is circumferential. There is no evidence of cardiac tamponade. Mitral Valve: The mitral valve is normal in structure. Mild mitral valve regurgitation. MV peak gradient, 4.7 mmHg. The mean mitral valve gradient is 2.0 mmHg. Tricuspid Valve: The tricuspid valve is normal in structure. Tricuspid valve regurgitation is mild to moderate. Aortic Valve: The aortic valve is tricuspid. . There is mild thickening and mild calcification of the aortic valve. Aortic valve  regurgitation is trivial. Mild aortic stenosis is present. There is mild thickening of the aortic valve. There is mild calcification of the aortic valve. Aortic valve mean gradient measures 6.7 mmHg. Aortic valve peak gradient measures 14.0 mmHg. Aortic valve area, by VTI measures 1.59 cm. Pulmonic Valve: The pulmonic valve was grossly normal. Pulmonic valve regurgitation is not visualized. Aorta: The aortic root and ascending aorta are structurally normal, with no evidence of dilitation. Venous: The inferior  vena cava is normal in size with greater than 50% respiratory variability, suggesting right atrial pressure of 3 mmHg. IAS/Shunts: No atrial level shunt detected by color flow Doppler. Additional Comments: A pacer wire is visualized.  LEFT VENTRICLE PLAX 2D LVIDd:         4.70 cm      Diastology LVIDs:         3.30 cm      LV e' lateral:   4.95 cm/s LV PW:         1.60 cm      LV E/e' lateral: 20.4 LV IVS:        1.44 cm      LV e' medial:    4.50 cm/s LVOT diam:     2.20 cm      LV E/e' medial:  22.4 LV SV:         62 LV SV Index:   30 LVOT Area:     3.80 cm  LV Volumes (MOD) LV vol d, MOD A2C: 68.6 ml LV vol d, MOD A4C: 140.0 ml LV vol s, MOD A2C: 37.9 ml LV vol s, MOD A4C: 62.2 ml LV SV MOD A2C:     30.7 ml LV SV MOD A4C:     140.0 ml LV SV MOD BP:      51.4 ml RIGHT VENTRICLE             IVC RV Basal diam:  3.20 cm     IVC diam: 1.80 cm RV S prime:     10.20 cm/s TAPSE (M-mode): 2.0 cm LEFT ATRIUM              Index       RIGHT ATRIUM           Index LA diam:        3.70 cm  1.77 cm/m  RA Area:     16.10 cm LA Vol (A2C):   81.5 ml  39.03 ml/m RA Volume:   37.70 ml  18.05 ml/m LA Vol (A4C):   108.0 ml 51.72 ml/m LA Biplane Vol: 95.7 ml  45.83 ml/m  AORTIC VALVE AV Area (Vmax):    1.80 cm AV Area (Vmean):   1.89 cm AV Area (VTI):     1.59 cm AV Vmax:           187.33 cm/s AV Vmean:          117.333 cm/s AV VTI:            0.389 m AV Peak Grad:      14.0 mmHg AV Mean Grad:      6.7 mmHg LVOT Vmax:          88.70 cm/s LVOT Vmean:        58.300 cm/s LVOT VTI:          0.163 m LVOT/AV VTI ratio: 0.42  AORTA Ao Root diam: 3.20 cm Ao Asc diam:  3.00 cm MITRAL VALVE                TRICUSPID VALVE MV Area (PHT): 4.39 cm     TR Peak grad:   36.5 mmHg MV Peak grad:  4.7 mmHg     TR Vmax:        302.00 cm/s MV Mean grad:  2.0 mmHg MV Vmax:       1.08 m/s     SHUNTS MV Vmean:      63.4 cm/s  Systemic VTI:  0.16 m MV Decel Time: 173 msec     Systemic Diam: 2.20 cm MV E velocity: 101.00 cm/s MV A velocity: 67.80 cm/s MV E/A ratio:  1.49 Mihai Croitoru MD Electronically signed by Sanda Klein MD Signature Date/Time: 04/02/2020/5:55:21 PM    Final     Cardiac Studies   TTE 04/02/2020 1. There is significant apical-basal and septal-lateral pacing-induced  dyssynchrony. Left ventricular ejection fraction, by estimation, is 40 to  45%. The left ventricle has mildly decreased function. The left ventricle  demonstrates regional wall motion  abnormalities (see scoring diagram/findings for description). There is  moderate concentric left ventricular hypertrophy. Left ventricular  diastolic parameters are consistent with Grade II diastolic dysfunction  (pseudonormalization). Elevated left atrial  pressure. There is moderate dyskinesis of the left ventricular, entire  apical segment, although this may be artifactual (pacing related).  2. Right ventricular systolic function is normal. The right ventricular  size is normal. Mildly increased right ventricular wall thickness. There  is mildly elevated pulmonary artery systolic pressure. The estimated right  ventricular systolic pressure is  43.1 mmHg.  3. Left atrial size was mildly dilated.  4. Right atrial size was mildly dilated.  5. The mitral valve is normal in structure. Mild mitral valve  regurgitation.  6. Tricuspid valve regurgitation is mild to moderate.  7. The aortic valve is tricuspid. Aortic valve regurgitation is trivial.  Very mild aortic  valve stenosis.  8. The inferior vena cava is normal in size with greater than 50%  respiratory variability, suggesting right atrial pressure of 3 mmHg.   Patient Profile     86 y.o. female with a history of sick sinus syndrome status post dual-chamber pacemaker, hypertension, diabetes, OSA on CPAP, multiple myeloma, ESRD on dialysis who presents for evaluation of abnormal ECG.  Assessment & Plan    Sick sinus syndrome: Status post [facility] dual-chamber pacemaker.  Device functioning appropriately.  Elevated troponin: Likely due to dialysis needs.  No current chest pain.  No changes.  3.  Acute shortness of breath: Managed by dialysis.  CPAP compliance encouraged.  [facility]elita Amparo sign off.   Medication Recommendations: None Other recommendations (labs, testing, etc):   Follow up as an outpatient: Usual follow-up  For questions or updates, please contact [place] Please consult [email or web address] for contact info under        Signed, Marabelle Cushman Meredith Leeds, MD  04/03/2020, 12:11 PM

## 2020-04-03 NOTE — Progress Notes (Signed)
[facility] Progress Note   Subjective:  Seen in HD unit. UF goal 1.8L Hypotensive on HD. No complaints this am. Denies CP,SOB.   Objective Vitals:   04/03/20 0725 04/03/20 0735 04/03/20 0800 04/03/20 0830  BP: 125/65 (!) 105/58 (!) 87/42 (!) 88/42  Pulse:  72 70 73  Resp:  16 16 16   Temp: 98.2 F (36.8 C)     TempSrc:      SpO2:      Weight: 54.3 kg         Additional Objective Labs: Basic Metabolic Panel: Recent Labs  Lab 04/02/20 0202 04/02/20 0235 04/03/20 0521  NA 136 135 136  K 3.7 3.4* 4.0  CL 96* 95* 97*  CO2 25  --  26  GLUCOSE 96 96 71  BUN 13 15 27*  CREATININE 2.69* 2.90* 4.33*  CALCIUM 8.4*  --  8.7*   CBC: Recent Labs  Lab 04/02/20 0202 04/02/20 0235 04/03/20 0521  WBC 4.0  --  5.0  HGB 9.5* 10.2* 9.5*  HCT 29.2* 30.0* 28.8*  MCV 92.4  --  91.4  PLT 252  --  202   Blood Culture    Component Value Date/Time   SDES URINE, CLEAN CATCH 11/27/2019 1525   SPECREQUEST NONE 11/27/2019 1525   CULT  11/27/2019 1525    NO GROWTH Performed at [facility], [place] [address] [place], [place] [address]    REPTSTATUS 11/28/2019 FINAL 11/27/2019 1525     Physical Exam General: Elderly female, nad Heart: RRR no m,r,g  Lungs: Clear, bilaterally  Abdomen: soft, non-tender  Extremities: no LE edema  Dialysis Access: R IJ TDC in use   Medications: . sodium chloride     . acyclovir  400 mg Oral Daily  . allopurinol  50 mg Oral Daily  . aspirin EC  81 mg Oral Daily  . Chlorhexidine Gluconate Cloth  6 each Topical Daily  . cholecalciferol  1,000 Units Oral Daily  . dexamethasone  2 mg Oral QODAY  . dorzolamide  1 drop Left Eye BID  . fluorometholone  1 drop Right Eye Daily  . heparin  5,000 Units Subcutaneous Q8H  . heparin sodium (porcine)      . insulin aspart  0-6 Units Subcutaneous TID WC  . latanoprost  1 drop Left Eye QHS  . multivitamin  1 tablet Oral QPM  . polyethylene glycol  17 g Oral Daily  . pregabalin  50  mg Oral QHS  . senna-docusate  2 tablet Oral BID  . sevelamer carbonate  800-1,600 mg Oral TID WC  . sodium chloride flush  3 mL Intravenous Q12H  . sodium chloride flush  3 mL Intravenous Q12H    Dialysis Orders:  [facility] TTS 4h 45mn  350/800 EDW 55.5kg 2K/2Ca TDC Heparin 2000  Hectorol 1 2x/week.   Assessment/Plan: 1. Acute onset dyspnea - Improved this am. Currently off supp O2 with O2 sats 100%.  CXR neg for plum edema. No volume on exam. Echo 6/18 EF 40-45%, Grade II DD.  Optimize volume status on HD today.   2. ESRD -  HD TTS. HD today on schedule.  3. Hypertension/volume  - BP controlled. Occasional episodes of hypotension on dialysis. No volume overload on exam. Challenging EDW on HD today.  4. Anemia  - Hb 10.2, not on ESA as outpatient. Follow.  5. Metabolic bone disease -  Ca ok. Continue binders/VDRA  6. Multiple myeloma - Followed by Dr. GAlvy Bimler  7. OSA on CPAP    [name] Child PA-C [facility] 04/03/2020,8:54 AM

## 2020-04-03 NOTE — Progress Notes (Signed)
PROGRESS NOTE  [name]:[id number] DOB: [birth year] DOA: 04/02/2020 PCP: Lucianne Lei, MD  Brief History   Ms Mcmasters is a 86 yr old woman who presented to [facility] ED last night with complaints of shortness of breath. She has a past medical history significant for SSS with pacemaker, ESRD on HD (last HD 04/01/2020), OSA on CPAP, Multiple myeloma not in remission, hypotension with syncope.  CXR of the Chest demonstrated no cardio/pulmonary pathology, but CT of the chest did demonstrate some areas of pulmonary edema.   [facility] were consulted to admit the patient for further work up and treatment.   Initial EKG demonstrated sinus rhythm with a Left BBB. When telemetry seemed to show ST segment elavations repeat EKG was obtained that demonstrated no ST segment elevations, but a change to a junctional rhythm. Repeat troponins were ordered and a cardiology consult was obtained. Repeat troponins demonstrate an increase from 99 to 160. Cardiology was consulted. They felt that elevated troponins were due to need for HD. They have signed off.  Nephrology was also consulted. The patient went for HD yesterday and today. She has been markedly hypotensive overnight and at time with HD. All of her antihypertensive meds have been stopped recently due to hypotension.   Consultants  . Nephrology . Cardiology  Procedures  . HD  Antibiotics   Anti-infectives (From admission, onward)   Start     Dose/Rate Route Frequency Ordered Stop   04/02/20 1030  acyclovir (ZOVIRAX) tablet 400 mg     Discontinue     400 mg Oral Daily 04/02/20 1005      .  Subjective  The patient is seen following HD. No new complaints.   Objective   Vitals:  Vitals:   04/03/20 1135 04/03/20 1200  BP: (!) 104/47 (!) 103/43  Pulse:  70  Resp:  15  Temp: 98.2 F (36.8 C) 98.3 F (36.8 C)  SpO2:  100%    Exam:  Constitutional:  . The patient is awake, alert, and oriented x 3. No acute  distress. Respiratory:  . No increased work of breathing. . No wheezes, rales, or rhonchi . No tactile fremitus Cardiovascular:  . Regular rate and rhythm . No murmurs, ectopy, or gallups. . No lateral PMI. No thrills. Abdomen:  . Abdomen is soft, non-tender, non-distended . No hernias, masses, or organomegaly . Normoactive bowel sounds.  Musculoskeletal:  . No cyanosis, clubbing, or edema . Right upper extremity is swollen due to graft placement. Skin:  . No rashes, lesions, ulcers . palpation of skin: no induration or nodules Neurologic:  . CN 2-12 intact . Sensation all 4 extremities intact Psychiatric:  . Mental status o Mood, affect appropriate o Orientation to person, place, time  . judgment and insight appear intact  I have personally reviewed the following:   Today's Data  . Vitals, BMP, CBC  Micro Data  . Urine culture: No growth  Cardiology Data  . Echocardiogram: Pacing induced dys-synchrony, EF 40-45% with mildly decreased LV function. There are regional wall motion abnormalities. There is evidence of a grade II  Diastolic dysfunction. There is mild to moderate tricuspid valve regurgitation.  Scheduled Meds: . acyclovir  400 mg Oral Daily  . allopurinol  50 mg Oral Daily  . aspirin EC  81 mg Oral Daily  . Chlorhexidine Gluconate Cloth  6 each Topical Daily  . cholecalciferol  1,000 Units Oral Daily  . dexamethasone  2 mg Oral QODAY  . dorzolamide  1  drop Left Eye BID  . fluorometholone  1 drop Right Eye Daily  . heparin  5,000 Units Subcutaneous Q8H  . insulin aspart  0-6 Units Subcutaneous TID WC  . latanoprost  1 drop Left Eye QHS  . multivitamin  1 tablet Oral QPM  . polyethylene glycol  17 g Oral Daily  . pregabalin  50 mg Oral QHS  . senna-docusate  2 tablet Oral BID  . sevelamer carbonate  800-1,600 mg Oral TID WC  . sodium chloride flush  3 mL Intravenous Q12H  . sodium chloride flush  3 mL Intravenous Q12H   Continuous Infusions: .  sodium chloride      Principal Problem:   Acute respiratory failure with hypoxia (HCC) Active Problems:   Obstructive sleep apnea   Multiple myeloma not having achieved remission (HCC)   ESRD (end stage renal disease) on dialysis ([place])   Elevated troponin   LOS: 1 day   A & P  Acute hypoxic respiratory failure: Resolved. Likely due to pulmonary edema and need for HD. The patient is currently saturating in the 90's on room air.   Elevated troponins with abnormal EKG: The patient was admitted to a telemetry bed. Cardiology was consulted. Troponins did trend down. Echocardiogram was obtained that demonstrated Pacing induced dys-synchrony, EF 40-45% with mildly decreased LV function. There are regional wall motion abnormalities. There is evidence of a grade II  Diastolic dysfunction. There is mild to moderate tricuspid valve regurgitation. Cardiology feels that elevated troponins were due to patient's need for HD. They have signed off and have recommended no changes to medications. They wish for the patient to follow up with them as outpatient. The patient continues to deny chest pain.  Hypotension: The patient had some low blood pressures overnight and early this morning with systolic pressures in the 80's. It improved this morning. The patient states that this has been occurring, and that many times she is unable to dialyze because her blood pressure is too low. She has recently been taken off of all of her antihypertensives for this reason. I will check a Cosyntropin stim test in the morning.  ESRD on HD: The patient has been admitted to a telemetry bed, and nephrology. She has gone to HD yesterday and today. Pulmonary edema and hypoxic respiratory failure have resolved. She has been fluid restricted. Monitor electrolytes.    Multiple myeloma: Follows with oncology, has not achieved remission.  OSA: Continue CPAP qHS    I have seen and examined this patient myself. I have spent 36 minutes  in her evaluation and care.  DVT prophylaxis: sq heparin  Code Status: Full  Family Communication: Discussed with patient  Disposition Plan:  Patient is from: Home  Anticipated d/c is to: home Anticipated d/c date is: 04/03/20 Barriers to discharge: Significant hypotension off of antihypertensive medication. Will check Cosyntropin stim test as outpatient.  Nickalus Thornsberry, DO [facility] Direct contact: see [email or web address]  7PM-7AM contact night coverage as above 04/03/2020, 3:40 PM  LOS: 1 day

## 2020-04-04 LAB — CBC
HCT: 24.9 % — ABNORMAL LOW (ref 36.0–46.0)
Hemoglobin: 8 g/dL — ABNORMAL LOW (ref 12.0–15.0)
MCH: 29.9 pg (ref 26.0–34.0)
MCHC: 32.1 g/dL (ref 30.0–36.0)
MCV: 92.9 fL (ref 80.0–100.0)
Platelets: 194 10*3/uL (ref 150–400)
RBC: 2.68 MIL/uL — ABNORMAL LOW (ref 3.87–5.11)
RDW: 17 % — ABNORMAL HIGH (ref 11.5–15.5)
WBC: 4.2 10*3/uL (ref 4.0–10.5)
nRBC: 0 % (ref 0.0–0.2)

## 2020-04-04 LAB — BASIC METABOLIC PANEL
Anion gap: 10 (ref 5–15)
BUN: 16 mg/dL (ref 8–23)
CO2: 26 mmol/L (ref 22–32)
Calcium: 8.4 mg/dL — ABNORMAL LOW (ref 8.9–10.3)
Chloride: 99 mmol/L (ref 98–111)
Creatinine, Ser: 3.05 mg/dL — ABNORMAL HIGH (ref 0.44–1.00)
GFR calc Af Amer: 15 mL/min — ABNORMAL LOW (ref >60)
GFR calc non Af Amer: 13 mL/min — ABNORMAL LOW (ref >60)
Glucose, Bld: 79 mg/dL (ref 70–99)
Potassium: 4.4 mmol/L (ref 3.5–5.1)
Sodium: 135 mmol/L (ref 135–145)

## 2020-04-04 LAB — ACTH STIMULATION, 3 TIME POINTS
Cortisol, 30 Min: 16.6 ug/dL
Cortisol, 60 Min: 19.8 ug/dL
Cortisol, Base: 6.5 ug/dL

## 2020-04-04 LAB — GLUCOSE, CAPILLARY
Glucose-Capillary: 90 mg/dL (ref 70–99)
Glucose-Capillary: 99 mg/dL (ref 70–99)

## 2020-04-04 MED ORDER — DEXAMETHASONE 2 MG PO TABS: 2.0000 mg | ORAL_TABLET | Freq: Every day | ORAL | Status: DC

## 2020-04-04 NOTE — TOC Initial Note (Signed)
Transition of Care [facility]) - Initial/Assessment Note    Patient Details  Name: [name] MRN: [id number] Date of Birth: 16-Apr-1930  Transition of Care [facility]) CM/SW Contact:    Bethena Roys, RN Phone Number: 04/04/2020, 11:15 AM  Clinical Narrative:  Risk for readmission assessment completed. Prior to arrival patient was from home with husband and support of son. Son takes patient to all appointments and picks up medication. Patient has HD on TTS. Patient states she has a caregiver that assists her on those days to get her ready for HD. Case Manager discussed home health needs with patient and she feels that she will not need any assistance at this time. Patient is aware to contact her primary care physician if she needs home health services in the future. No further needs from Case Manager at this time.               Expected Discharge Plan: Home/Self Care Barriers to Discharge: No Barriers Identified   Patient Goals and CMS Choice Patient states their goals for this hospitalization and ongoing recovery are:: to return home   Choice offered to / list presented to : NA  Expected Discharge Plan and Services Expected Discharge Plan: Home/Self Care In-house Referral: NA Discharge Planning Services: CM Consult Post Acute Care Choice: NA Living arrangements for the past 2 months: Single Family Home Expected Discharge Date: 04/04/20               DME Arranged: N/A    HH Arranged: NA    Prior Living Arrangements/Services Living arrangements for the past 2 months: Single Family Home Lives with:: Spouse Patient language and need for interpreter reviewed:: Yes        Need for Family Participation in Patient Care: Yes (Comment) Care giver support system in place?: Yes (comment) Current home services: Other (comment) (personal care services on TTS) Criminal Activity/Legal Involvement Pertinent to Current Situation/Hospitalization: No - Comment as  needed   Emotional Assessment Appearance:: Appears stated age Attitude/Demeanor/Rapport: Engaged Affect (typically observed): Appropriate Orientation: : Oriented to Situation, Oriented to  Time, Oriented to Self, Oriented to Place Alcohol / Substance Use: Not Applicable Psych Involvement: No (comment)  Admission diagnosis:  Acute respiratory distress [R06.03] Hypoxia [R09.02] Acute respiratory failure with hypoxia ([place]) [J96.01] Patient Active Problem List   Diagnosis Date Noted  . Acute respiratory failure with hypoxia ([place]) 04/02/2020  . Elevated troponin 04/02/2020  . Hypotension 03/23/2020  . Left hip pain 01/12/2020  . Vitamin B12 deficiency 10/20/2019  . Deficiency anemia 09/26/2019  . Peripheral neuropathy due to chemotherapy ([place]) 07/04/2019  . Goals of care, counseling/discussion 05/30/2019  . Pressure injury of skin 05/26/2019  . Yeast infection of the skin 04/04/2019  . Cancer associated pain 03/26/2019  . Nausea in adult 03/14/2019  . ESRD (end stage renal disease) on dialysis ([place]) 03/14/2019  . Other constipation 03/14/2019  . Pancytopenia, acquired ([place]) 03/12/2019  . Multiple myeloma not having achieved remission ([place]) 03/03/2019  . Hypokalemia 02/25/2019  . Anemia   . Shingles 03/09/2015  . Sick sinus syndrome ([place]) 02/03/2015  . Shortness of breath 02/01/2014  . Fatigue 02/01/2014  . BENIGN PAROXYSMAL POSITIONAL VERTIGO 04/06/2010  . BRADYCARDIA 01/27/2009  . Diabetes mellitus ([place]) 02/24/2008  . Obstructive sleep apnea 02/24/2008  . 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 Essential hypertension 02/24/2008  . ALLERGIC RHINITIS 02/24/2008  . CAROTID BRUIT 02/24/2008   PCP:  Lucianne Lei, MD Pharmacy:   [id number] [place],  [place] - [address] [place] [address] Phone: [phone] Fax: [phone]  [facility], [place] - [address] [place] [address] Phone:  [phone] Fax: [phone]    Readmission Risk Interventions Readmission Risk Prevention Plan 04/04/2020 03/03/2019  Transportation Screening Complete Complete  PCP or Specialist Appt within 3-5 Days - Not Complete  Not Complete comments - not yet ready for d/c  HRI or [place] - Not Complete  HRI or Home Care Consult comments - no needs at this time  Social Work Consult for [place] Planning/Counseling - Not Complete  SW consult not completed comments - no needs at this time  Palliative Care Screening - Not Applicable  Medication Review ([place]) Complete Complete  PCP or Specialist appointment within 3-5 days of discharge (No Data) -  [place] or Home Care Consult Complete -  SW Recovery Care/Counseling Consult Complete -  Palliative Care Screening Not Applicable -  [place] Not Applicable -  Some recent data might be hidden

## 2020-04-04 NOTE — Plan of Care (Signed)
  Problem: Education: Goal: Knowledge of General Education information will improve Description: Including pain rating scale, medication(s)/side effects and non-pharmacologic comfort measures Outcome: Completed/Met   Problem: Health Behavior/Discharge Planning: Goal: Ability to manage health-related needs will improve Outcome: Completed/Met   Problem: Clinical Measurements: Goal: Ability to maintain clinical measurements within normal limits will improve Outcome: Completed/Met Goal: Will remain free from infection Outcome: Completed/Met Goal: Diagnostic test results will improve Outcome: Completed/Met Goal: Respiratory complications will improve Outcome: Completed/Met Goal: Cardiovascular complication will be avoided Outcome: Completed/Met   Problem: Activity: Goal: Risk for activity intolerance will decrease Outcome: Completed/Met   Problem: Nutrition: Goal: Adequate nutrition will be maintained Outcome: Completed/Met   Problem: Coping: Goal: Level of anxiety will decrease Outcome: Completed/Met   Problem: Pain Managment: Goal: General experience of comfort will improve Outcome: Completed/Met   Problem: Safety: Goal: Ability to remain free from injury will improve Outcome: Completed/Met   Problem: Skin Integrity: Goal: Risk for impaired skin integrity will decrease Outcome: Completed/Met   Discharge instructions reviewed with patient.  These included, but were not limited to, the following:  medication list, when to call the MD, activity recommendations, follow-up MD visits, etc.  Patient discharged to private residence via private vehicle driven by son.  Home medications and discharge instructions given to patient.

## 2020-04-04 NOTE — Progress Notes (Addendum)
[facility] Progress Note   Subjective:  Completed HD yesterday with minimal UF d/t hypotension on HD. BP stable overnight. No complaints this am. Wondering if she can go home today.   Objective Vitals:   04/04/20 0000 04/04/20 0500 04/04/20 0526 04/04/20 0900  BP: (!) 94/36 (!) 112/53  (!) 121/50  Pulse: 70 70  70  Resp: 17 (!) 22  20  Temp: 98.3 F (36.8 C) 97.9 F (36.6 C)  98.6 F (37 C)  TempSrc:    Oral  SpO2: 100% 100%  100%  Weight:   54.6 kg       Additional Objective Labs: Basic Metabolic Panel: Recent Labs  Lab 04/02/20 0202 04/02/20 0202 04/02/20 0235 04/03/20 0521 04/04/20 0658  NA 136   < > 135 136 135  K 3.7   < > 3.4* 4.0 4.4  CL 96*   < > 95* 97* 99  CO2 25  --   --  26 26  GLUCOSE 96   < > 96 71 79  BUN 13   < > 15 27* 16  CREATININE 2.69*   < > 2.90* 4.33* 3.05*  CALCIUM 8.4*  --   --  8.7* 8.4*   < > = values in this interval not displayed.   CBC: Recent Labs  Lab 04/02/20 0202 04/02/20 0202 04/02/20 0235 04/03/20 0521 04/04/20 0658  WBC 4.0  --   --  5.0 4.2  HGB 9.5*   < > 10.2* 9.5* 8.0*  HCT 29.2*   < > 30.0* 28.8* 24.9*  MCV 92.4  --   --  91.4 92.9  PLT 252  --   --  202 194   < > = values in this interval not displayed.   Blood Culture    Component Value Date/Time   SDES URINE, CLEAN CATCH 11/27/2019 1525   SPECREQUEST NONE 11/27/2019 1525   CULT  11/27/2019 1525    NO GROWTH Performed at [facility], [place] [address] [place], [place] [address]    REPTSTATUS 11/28/2019 FINAL 11/27/2019 1525     Physical Exam General: Elderly female, nad Heart: RRR no m,r,g  Lungs: Clear, bilaterally  Abdomen: soft, non-tender  Extremities: no LE edema  Dialysis Access: R IJ TDC in use   Medications: . sodium chloride     . acyclovir  400 mg Oral Daily  . allopurinol  50 mg Oral Daily  . aspirin EC  81 mg Oral Daily  . Chlorhexidine Gluconate Cloth  6 each Topical Daily  . cholecalciferol  1,000 Units  Oral Daily  . dexamethasone  2 mg Oral QODAY  . dorzolamide  1 drop Left Eye BID  . fluorometholone  1 drop Right Eye Daily  . heparin  5,000 Units Subcutaneous Q8H  . insulin aspart  0-6 Units Subcutaneous TID WC  . latanoprost  1 drop Left Eye QHS  . lenalidomide  2.5 mg Oral Q T,Th,Sa-HD  . multivitamin  1 tablet Oral QPM  . polyethylene glycol  17 g Oral Daily  . pregabalin  50 mg Oral QHS  . senna-docusate  2 tablet Oral BID  . sevelamer carbonate  800-1,600 mg Oral TID WC  . sodium chloride flush  3 mL Intravenous Q12H  . sodium chloride flush  3 mL Intravenous Q12H    Dialysis Orders:  NW TTS 4h 27mn  350/800 EDW 55.5kg 2K/2Ca TDC Heparin 2000  Hectorol 1 2x/week.   Assessment/Plan: 1. Acute onset dyspnea -  Improved. Currently off supp O2 with O2 sats 100%.  CXR neg for plum edema. No volume on exam. Echo 6/18 EF 40-45%, Grade II DD. Optimize volume status with dialysis.  2. ESRD -  HD TTS. Next HD 6/21 3. Hypertension/volume  - BP controlled. Occasional episodes of hypotension on dialysis. No volume overload on exam. Didn't tolerate UF challenge on HD. Post wt. 54.6kg. Consider midodrine for BP support on HD.  4. Anemia  - Hb 10.2 >9,5>8.0. Resume ESA with next HD -not ordered yet.  5. Metabolic bone disease -  Ca ok. Continue binders/VDRA  6. Multiple myeloma - Followed by Dr. Alvy Bimler  7. OSA on CPAP   8. Disp: Stable for discharge from renal standpoint   Lynnda Child PA-C [place] 04/04/2020,9:59 AM

## 2020-04-04 NOTE — Discharge Summary (Addendum)
Physician Discharge Summary  [name] WYO:[id number] DOB: 02/27/1930 DOA: 04/02/2020  PCP: Lucianne Lei, MD  Admit date: 04/02/2020 Discharge date: 04/04/2020  Recommendations for Outpatient Follow-up:  Discharge to home Continue with dialysis as scheduled.  Follow up with nephrology as directed.  Follow up with cardiology as directed. Follow up with PCP in 7-10 days. Pt should follow up with endocrinology as outpatient for adrenal insufficiency. Keep scheduled follow up appointments with oncology.   Discharge Diagnoses: Principal diagnosis is #1 Hypotension due to Adrenal insufficiency Acute hypoxic respiratory failure due to pulmonary edema Acute Pulmonary edema due to volume overload ESRD on HD  Diastolic dysfunction Multiple Myeloma OSA on CPAP  Discharge Condition: Fair  Disposition: Home  Diet recommendation: Heart healthy/renal  Filed Weights   04/03/20 0725 04/03/20 1135 04/04/20 0526  Weight: 54.3 kg 54 kg 54.6 kg    History of present illness:  [name] is a 86 y.o. female with medical history significant for sick sinus syndrome with pacer, ESRD on hemodialysis, OSA on CPAP, multiple myeloma not in remission, and recent hypotension with syncope, now presenting to the emergency department with shortness of breath.  Patient reports that she had been in her usual state of health, completed dialysis, and had dinner before going to bed, but shortly after laying down she developed acute onset shortness of breath.  She had not been coughing, denies fevers or chills, and denies any chest pain or palpitations.  There was no nausea or diaphoresis associated with this.  Patient reports feeling as though she could not catch her breath but now wonders if this could have just been a panic attack.  She had never experienced this sensation previously.  Her son checks her blood pressure daily after her antihypertensives were recently stopped due to hypotension, and she  reports that she blood pressure has been consistently normal off of the medications.     EMS was called, found the patient to be saturating 88% on room air, placed on nonrebreather, she continued to have labored respirations and was transitioned to CPAP prior to arrival in the ED.   ED Course: Upon arrival to the ED, patient is found to be afebrile, weaned down to 4 L/min via nasal cannula, tachypneic, and with blood pressure 112/79.  EKG features sinus rhythm with LBBB.  Chest x-ray notable for cardiomegaly but no acute cardiopulmonary disease.  CTA chest is negative for PE but notable for moderate cardiomegaly and scattered lytic lesions.  Chemistry panel features a normal potassium, normal bicarbonate, and normal BUN.  CBC with stable normocytic anemia.  BNP is elevated to 261 and troponin is elevated to 101.  COVID-19 PCR is negative.  ED physician discussed the case with cardiology who recommended checking another troponin level but after reviewing the case including EKG, had low suspicion for ACS.  With new supplemental oxygen requirement, hospitalists were asked to admit.   Hospital Course:  Ms Chain is a 86 yr old woman who presented to [facility] ED last night with complaints of shortness of breath. She has a past medical history significant for SSS with pacemaker, ESRD on HD (last HD 04/01/2020), OSA on CPAP, Multiple myeloma not in remission, hypotension with syncope.   CXR of the Chest demonstrated no cardio/pulmonary pathology, but CT of the chest did demonstrate some areas of pulmonary edema.    [facility] were consulted to admit the patient for further work up and treatment.    Initial EKG demonstrated sinus rhythm with a  Left BBB. When telemetry seemed to show ST segment elavations repeat EKG was obtained that demonstrated no ST segment elevations, but a change to a junctional rhythm. Repeat troponins were ordered and a cardiology consult was obtained. Repeat troponins demonstrate an  increase from 99 to 160. Cardiology was consulted. They felt that elevated troponins were due to need for HD. They have signed off.   Nephrology was also consulted. The patient went for HD yesterday and today. She has been markedly hypotensive overnight and at time with HD. All of her antihypertensive meds have been stopped recently due to hypotension.   Cosyntropin stim test was performed which indicated adrenal insufficiency. She is already taking decadron every other day. She should follow up with endocrinology as outpatient.  She will be discharged to home in fair condition.   Today's assessment: S: The patient states that she is feeling well. No new complaints. O: Vitals:  Vitals:   04/04/20 0500 04/04/20 0900  BP: (!) 112/53 (!) 121/50  Pulse: 70 70  Resp: (!) 22 20  Temp: 97.9 F (36.6 C) 98.6 F (37 C)  SpO2: 100% 100%   Exam:  Constitutional:  The patient is awake, alert, and oriented x 3. No acute distress. Respiratory:  No increased work of breathing. No wheezes, rales, or rhonchi No tactile fremitus Cardiovascular:  Regular rate and rhythm No murmurs, ectopy, or gallups. No lateral PMI. No thrills. Abdomen:  Abdomen is soft, non-tender, non-distended No hernias, masses, or organomegaly Normoactive bowel sounds.  Musculoskeletal:  No cyanosis, clubbing, or edema Skin:  No rashes, lesions, ulcers palpation of skin: no induration or nodules Neurologic:  CN 2-12 intact Sensation all 4 extremities intact Psychiatric:  Mental status Mood, affect appropriate Orientation to person, place, time  judgment and insight appear intact  Discharge Instructions  Discharge Instructions     Activity as tolerated - No restrictions   Complete by: As directed    Call MD for:  extreme fatigue   Complete by: As directed    Call MD for:  persistant dizziness or light-headedness   Complete by: As directed    Call MD for:  persistant nausea and vomiting   Complete by:  As directed    Call MD for:  temperature >100.4   Complete by: As directed    Diet - low sodium heart healthy   Complete by: As directed    Discharge instructions   Complete by: As directed    Discharge to home Continue with dialysis as scheduled.  Follow up with nephrology as directed.  Follow up with cardiology as directed. Follow up with PCP in 7-10 days. Pt should follow up with endocrinology as outpatient for adrenal insufficiency. Keep scheduled follow up appointments with oncology.   Increase activity slowly   Complete by: As directed    No wound care   Complete by: As directed       Allergies as of 04/04/2020       Reactions   Aspirin Other (See Comments)   REACTION: STOMACH ISSUES WITH DOSE HIGHER THAN 81 MG  Other reaction(s): GI Upset (intolerance), Other (See Comments) REACTION: STOMACH ISSUES WITH DOSE HIGHER THAN 81 MG  REACTION: STOMACH ISSUES WITH DOSE HIGHER THAN 81 MG    Hydrocodone-acetaminophen Nausea And Vomiting   Morphine Nausea And Vomiting   Penicillins Rash   Patient took injection and tablets, had a reaction. She has taken amoxicillin with no reaction Has patient had a PCN reaction causing immediate rash, facial/tongue/throat swelling,  SOB or lightheadedness with hypotension: Yes Has patient had a PCN reaction causing severe rash involving mucus membranes or skin necrosis: Yes Has patient had a PCN reaction that required hospitalization No Has patient had a PCN reaction occurring within the last 10 years: No If all of the above answers are "NO", then m Patient took injection and tablets, had a reaction. She has taken amoxicillin with no reaction Patient took injection and tablets, had a reaction. She has taken amoxicillin with no reaction Has patient had a PCN reaction causing immediate rash, facial/tongue/throat swelling, SOB or lightheadedness with hypotension: Yes Has patient had a PCN reaction causing severe rash involving mucus membranes or  skin necrosis: Yes Has patient had a PCN reaction that required hospitalization No Has patient had a PCN reaction occurring within the last 10 years: No If all of the above answers are "NO", then m   Betaine Nausea And Vomiting   Dacarbazine Nausea And Vomiting   Ketorolac Tromethamine Itching   Brimonidine Itching   Diltiazem Hcl Rash   Hydrocodone-acetaminophen Nausea And Vomiting   Neurontin [gabapentin] Nausea And Vomiting   Sulfacetamide Sodium Rash        Medication List     STOP taking these medications    isosorbide-hydrALAZINE 20-37.5 MG tablet Commonly known as: BIDIL       TAKE these medications    acetaminophen 500 MG tablet Commonly known as: TYLENOL Take 1,000 mg by mouth 2 (two) times daily as needed for moderate pain.   acyclovir 400 MG tablet Commonly known as: ZOVIRAX TAKE 1 TABLET BY MOUTH EVERY DAY   allopurinol 100 MG tablet Commonly known as: ZYLOPRIM Take 0.5 tablets (50 mg total) by mouth daily.   aspirin EC 81 MG tablet Take 81 mg by mouth daily. Swallow whole.   cholecalciferol 25 MCG (1000 UNIT) tablet Commonly known as: VITAMIN D3 Take 1,000 Units by mouth daily.   dexamethasone 2 MG tablet Commonly known as: DECADRON Take 1 tablet (2 mg total) by mouth every other day.   Dialyvite 800-Zinc 15 0.8 MG Tabs Take 1 tablet by mouth every evening.   dorzolamide 2 % ophthalmic solution Commonly known as: TRUSOPT Place 1 drop into the left eye 2 (two) times daily.   fluorometholone 0.1 % ophthalmic suspension Commonly known as: FML Place 1 drop into the right eye daily.   lactulose 10 GM/15ML solution Commonly known as: CHRONULAC Take 10 g by mouth as needed for mild constipation.   lenalidomide 2.5 MG capsule Commonly known as: REVLIMID Take 1 capsule three times a week after dialysis on Tuesday, Thursday and Saturdays   Lumigan 0.01 % Soln Generic drug: bimatoprost Place 1 drop into the left eye at bedtime.   Lyrica 50  MG capsule Generic drug: pregabalin Take 50 mg by mouth at bedtime.   ondansetron 4 MG tablet Commonly known as: ZOFRAN Take 1 tablet (4 mg total) by mouth every 8 (eight) hours as needed for nausea.   pantoprazole 40 MG tablet Commonly known as: PROTONIX Take 1 tablet (40 mg total) by mouth daily as needed (acid reflux/indigestion.).   polyethylene glycol 17 g packet Commonly known as: MIRALAX / GLYCOLAX Take 17 g by mouth daily.   prochlorperazine 5 MG tablet Commonly known as: COMPAZINE Take 1 tablet (5 mg total) by mouth every 6 (six) hours as needed for nausea or vomiting.   senna-docusate 8.6-50 MG tablet Commonly known as: Senokot-S Take 2 tablets by mouth 2 (two) times daily.  sevelamer carbonate 800 MG tablet Commonly known as: RENVELA Take 800-1,600 mg by mouth 3 (three) times daily with meals. Depending on meal size depends on the 1 or 2 tablets   sodium chloride 0.65 % Soln nasal spray Commonly known as: OCEAN Place 1 spray into both nostrils 4 (four) times daily as needed for congestion.   traMADol 50 MG tablet Commonly known as: Ultram Take 1 tablet (50 mg total) by mouth every 8 (eight) hours as needed.       Allergies  Allergen Reactions   Aspirin Other (See Comments)    REACTION: STOMACH ISSUES WITH DOSE HIGHER THAN 81 MG  Other reaction(s): GI Upset (intolerance), Other (See Comments) REACTION: STOMACH ISSUES WITH DOSE HIGHER THAN 81 MG  REACTION: STOMACH ISSUES WITH DOSE HIGHER THAN 81 MG    Hydrocodone-Acetaminophen Nausea And Vomiting   Morphine Nausea And Vomiting   Penicillins Rash    Patient took injection and tablets, had a reaction. She has taken amoxicillin with no reaction Has patient had a PCN reaction causing immediate rash, facial/tongue/throat swelling, SOB or lightheadedness with hypotension: Yes Has patient had a PCN reaction causing severe rash involving mucus membranes or skin necrosis: Yes Has patient had a PCN reaction that  required hospitalization No Has patient had a PCN reaction occurring within the last 10 years: No If all of the above answers are "NO", then m Patient took injection and tablets, had a reaction. She has taken amoxicillin with no reaction Patient took injection and tablets, had a reaction. She has taken amoxicillin with no reaction Has patient had a PCN reaction causing immediate rash, facial/tongue/throat swelling, SOB or lightheadedness with hypotension: Yes Has patient had a PCN reaction causing severe rash involving mucus membranes or skin necrosis: Yes Has patient had a PCN reaction that required hospitalization No Has patient had a PCN reaction occurring within the last 10 years: No If all of the above answers are "NO", then m   Betaine Nausea And Vomiting   Dacarbazine Nausea And Vomiting   Ketorolac Tromethamine Itching   Brimonidine Itching   Diltiazem Hcl Rash   Hydrocodone-Acetaminophen Nausea And Vomiting   Neurontin [Gabapentin] Nausea And Vomiting   Sulfacetamide Sodium Rash    The results of significant diagnostics from this hospitalization (including imaging, microbiology, ancillary and laboratory) are listed below for reference.    Significant Diagnostic Studies: DG Chest 2 View  Result Date: 03/21/2020 CLINICAL DATA:  Right shoulder pain and nausea. EXAM: CHEST - 2 VIEW COMPARISON:  May 25, 2019 FINDINGS: A dual lead AICD is noted. There is no evidence of acute infiltrate, pleural effusion or pneumothorax. The heart size and mediastinal contours are within normal limits. There is moderate severity calcification of the aortic arch. Marked severity degenerative changes seen throughout the bilateral shoulders and thoracic spine. IMPRESSION: No active cardiopulmonary disease. Electronically Signed   By: Virgina Norfolk M.D.   On: 03/21/2020 16:13   DG Shoulder Right  Result Date: 03/21/2020 CLINICAL DATA:  Right shoulder pain. EXAM: RIGHT SHOULDER - 2+ VIEW COMPARISON:   None. FINDINGS: There is no evidence of an acute fracture or dislocation. Marked severity chronic and degenerative changes are seen involving the glenohumeral articulation and adjacent portions of the right humeral head and right glenoid. Chronic changes are also seen along the greater tubercle of the right humeral head. Soft tissues are unremarkable. IMPRESSION: Marked severity chronic and degenerative changes without evidence of acute osseous abnormality. Electronically Signed   By: Virgina Norfolk  M.D.   On: 03/21/2020 16:11   CT Head Wo Contrast  Result Date: 03/21/2020 CLINICAL DATA:  Neck pain. Neck pain and RIGHT shoulder pain just prior to syncope. Patient did not fall. RIGHT shoulder pain and nausea. EXAM: CT HEAD WITHOUT CONTRAST CT CERVICAL SPINE WITHOUT CONTRAST TECHNIQUE: Multidetector CT imaging of the head and cervical spine was performed following the standard protocol without intravenous contrast. Multiplanar CT image reconstructions of the cervical spine were also generated. COMPARISON:  CT head on 03/08/2015, CT of the cervical spine on 02/23/2015 FINDINGS: CT HEAD FINDINGS Brain: There is mild central and cortical atrophy. There is no intra or extra-axial fluid collection or mass lesion. The basilar cisterns and ventricles have a normal appearance. There is no CT evidence for acute infarction or hemorrhage. Vascular: There is dense calcification of the internal carotid arteries, associated with tortuosity. No hyperdense vessels. Skull: Hyperostosis frontalis. There are new circumscribed lytic lesions within the RIGHT frontal bone, RIGHT parietal bone, LEFT parietal bone consistent with metastatic disease versus myeloma. There is fluid within a previously aerated RIGHT petrous ridge air cell. Sinuses/Orbits: No acute finding. Other: None CT CERVICAL SPINE FINDINGS Alignment: There is 3 millimeters anterolisthesis of C4 on C5. There is 4 millimeters anterolisthesis of C7 on T1. Skull base and  vertebrae: Numerous new lytic lesions are identified throughout the cervical spine, involving vertebral bodies as well as posterior elements. Largest lesion is identified at T1. Lesion involves an expanded LEFT transverse process and extends into the vertebral body. There is significant associated destruction of the cortex of the transverse process. Similar lesions are identified within the proximal ribs. There is no definite evidence for acute fracture. Soft tissues and spinal canal: No prevertebral fluid or swelling. No visible canal hematoma. Disc levels: Significant disc height loss and uncovertebral spurring primarily at C4-5, C5-6, C6-7. Upper chest: Lung apices are clear. Other: Carotid artery calcification. IMPRESSION: 1. No evidence for acute intracranial abnormality.  Atrophy. 2. Interval development of lytic lesions within the skull, consistent metastatic disease or myeloma. 3. Numerous new lytic lesions throughout the cervical spine and proximal ribs consistent with metastatic disease versus myeloma. 4. Expanded LEFT transverse process of T1 is associated with cortical destruction, and possibly accounts for the patient's pain. Recommend further evaluation with MRI of the cervical spine. 5. Significant degenerative changes. 6. No evidence for acute cervical spine abnormality. These results were called by telephone at the time of interpretation on 03/21/2020 at 4:14 pm to provider [facility] , who verbally acknowledged these results. Electronically Signed   By: Nolon Nations M.D.   On: 03/21/2020 16:15   CT Angio Chest PE W and/or Wo Contrast  Result Date: 04/02/2020 CLINICAL DATA:  Shortness of breath EXAM: CT ANGIOGRAPHY CHEST WITH CONTRAST TECHNIQUE: Multidetector CT imaging of the chest was performed using the standard protocol during bolus administration of intravenous contrast. Multiplanar CT image reconstructions and MIPs were obtained to evaluate the vascular anatomy. CONTRAST:  149m  OMNIPAQUE IOHEXOL 350 MG/ML SOLN COMPARISON:  March 20, 2010 FINDINGS: Cardiovascular: There is a optimal opacification of the pulmonary arteries. There is no central,segmental, or subsegmental filling defects within the pulmonary arteries. There is moderate cardiomegaly. Coronary artery calcifications are seen. No pericardial effusion or thickening. No evidence right heart strain. There is normal three-vessel brachiocephalic anatomy without proximal stenosis. Scattered aortic atherosclerosis is noted. Mediastinum/Nodes: No hilar, mediastinal, or axillary adenopathy. Thyroid gland, trachea, and esophagus demonstrate no significant findings. Lungs/Pleura: Minimal bibasilar atelectasis is seen. No large  airspace consolidation is noted. No pleural effusion or pneumothorax. For Upper Abdomen: No acute abnormalities present in the visualized portions of the upper abdomen. Musculoskeletal: There is diffuse osteopenia. There heterogeneous lytic lesions seen scattered throughout the bilateral ribs and thoracic spine. No osseous fracture however is noted. Advanced bilateral shoulder osteoarthritis is noted. Review of the MIP images confirms the above findings. IMPRESSION: 1. No central, segmental, or subsegmental pulmonary embolism. 2. Moderate cardiomegaly. 3. No other acute intrathoracic pathology to explain the patient's symptoms. 4. Scattered lytic lesions throughout the visualized axial and appendicular skeleton, which could be due to metastatic process versus monoclonal gammopathy such is multiple myeloma Electronically Signed   By: Prudencio Pair M.D.   On: 04/02/2020 04:02   CT Cervical Spine Wo Contrast  Result Date: 03/21/2020 CLINICAL DATA:  Neck pain. Neck pain and RIGHT shoulder pain just prior to syncope. Patient did not fall. RIGHT shoulder pain and nausea. EXAM: CT HEAD WITHOUT CONTRAST CT CERVICAL SPINE WITHOUT CONTRAST TECHNIQUE: Multidetector CT imaging of the head and cervical spine was performed following  the standard protocol without intravenous contrast. Multiplanar CT image reconstructions of the cervical spine were also generated. COMPARISON:  CT head on 03/08/2015, CT of the cervical spine on 02/23/2015 FINDINGS: CT HEAD FINDINGS Brain: There is mild central and cortical atrophy. There is no intra or extra-axial fluid collection or mass lesion. The basilar cisterns and ventricles have a normal appearance. There is no CT evidence for acute infarction or hemorrhage. Vascular: There is dense calcification of the internal carotid arteries, associated with tortuosity. No hyperdense vessels. Skull: Hyperostosis frontalis. There are new circumscribed lytic lesions within the RIGHT frontal bone, RIGHT parietal bone, LEFT parietal bone consistent with metastatic disease versus myeloma. There is fluid within a previously aerated RIGHT petrous ridge air cell. Sinuses/Orbits: No acute finding. Other: None CT CERVICAL SPINE FINDINGS Alignment: There is 3 millimeters anterolisthesis of C4 on C5. There is 4 millimeters anterolisthesis of C7 on T1. Skull base and vertebrae: Numerous new lytic lesions are identified throughout the cervical spine, involving vertebral bodies as well as posterior elements. Largest lesion is identified at T1. Lesion involves an expanded LEFT transverse process and extends into the vertebral body. There is significant associated destruction of the cortex of the transverse process. Similar lesions are identified within the proximal ribs. There is no definite evidence for acute fracture. Soft tissues and spinal canal: No prevertebral fluid or swelling. No visible canal hematoma. Disc levels: Significant disc height loss and uncovertebral spurring primarily at C4-5, C5-6, C6-7. Upper chest: Lung apices are clear. Other: Carotid artery calcification. IMPRESSION: 1. No evidence for acute intracranial abnormality.  Atrophy. 2. Interval development of lytic lesions within the skull, consistent metastatic  disease or myeloma. 3. Numerous new lytic lesions throughout the cervical spine and proximal ribs consistent with metastatic disease versus myeloma. 4. Expanded LEFT transverse process of T1 is associated with cortical destruction, and possibly accounts for the patient's pain. Recommend further evaluation with MRI of the cervical spine. 5. Significant degenerative changes. 6. No evidence for acute cervical spine abnormality. These results were called by telephone at the time of interpretation on 03/21/2020 at 4:14 pm to provider [facility] , who verbally acknowledged these results. Electronically Signed   By: Nolon Nations M.D.   On: 03/21/2020 16:15   PERIPHERAL VASCULAR CATHETERIZATION  Result Date: 03/08/2020 Patient name: KRYSTALE RINKENBERGER MRN: [id number] DOB: 12-23-1929 Sex: female 03/08/2020 Pre-operative Diagnosis: esrd, malfunction of right arm avf Post-operative diagnosis:  Same Surgeon:  Eda Paschal. Donzetta Matters, MD Procedure Performed: 1.  Ultrasound-guided cannulation right arm AV fistula 2.  Right upper extremity shuntogram Indications: 86 year old female on dialysis via right arm fistula.  She has had difficulty with clearance.  She is now indicated for fistulogram possible invention. Findings: The fistula at the anastomosis is actually quite diseased.  There is runoff via the cephalic and basilic vein but these are diminutive there is no flow-limiting stenosis. Patient will be set up for right upper arm AV graft versus conversion upper arm AV fistula and will need catheter at the same time.  Procedure:  The patient was identified in the holding area and taken to room 8 where she was placed upon operative table right arm was prepped and draped timeout was called.  Ultrasound was used to identify the cephalic vein in the mid forearm.  This appeared quite diseased the anastomosis was patent.  The area was anesthetized with 1% lidocaine.  We cannulated with direct ultrasound visualization a micropuncture  needle followed wire sheath.  Images saved the permanent record.  We performed right upper extremity shuntogram.  We attempted to perform retrograde views but could not compress given the disease of the fistula.  I elected patient will need conversion upper arm access and a catheter.  I discussed this with the patient.  She tolerated procedure without any complication. Contrast: 40cc Brandon C. Donzetta Matters, MD [facility] Office: [phone] Pager: [phone]  DG Chest Portable 1 View  Result Date: 04/02/2020 CLINICAL DATA:  Shortness of breath EXAM: PORTABLE CHEST 1 VIEW COMPARISON:  03/25/2020 FINDINGS: Left pacer and right dialysis catheter remain in place, unchanged. Cardiomegaly. Aortic atherosclerosis. Lungs clear. No effusions or edema. No acute bony abnormality. IMPRESSION: Cardiomegaly.  No active disease. Electronically Signed   By: Rolm Baptise M.D.   On: 04/02/2020 02:06   DG Chest Port 1 View  Result Date: 03/25/2020 CLINICAL DATA:  Dialysis catheter placement, protocol evaluation for pneumothorax EXAM: PORTABLE CHEST 1 VIEW COMPARISON:  03/21/2020 FINDINGS: Single frontal view of the chest demonstrates right-sided dialysis catheter placement via subclavian approach, tip overlying superior vena cava. Dual lead pacer unchanged. Cardiac silhouette is enlarged but stable. No airspace disease, effusion, or pneumothorax. Stable degenerative changes of the shoulders. IMPRESSION: 1. No complication after right-sided dialysis catheter placement. The Electronically Signed   By: Randa Ngo M.D.   On: 03/25/2020 15:26   ECHOCARDIOGRAM COMPLETE  Result Date: 04/02/2020    ECHOCARDIOGRAM REPORT   Patient Name:   JANICA ELDRED Galasso Date of Exam: 04/02/2020 Medical Rec #:  [id number]        Height:       65.0 in Accession #:    [id number]       Weight:       227.2 lb Date of Birth:  December 11, 1929       BSA:          2.088 m Patient Age:    89 years         BP:           115/52  mmHg Patient Gender: F                HR:           70 bpm. Exam Location:  Inpatient Procedure: 2D Echo, Cardiac Doppler and Color Doppler Indications:    Elevated troponin  History:        Patient has prior history of Echocardiogram examinations, most  recent 03/01/2019. Pacemaker, Signs/Symptoms:Shortness of Breath;                 Risk Factors:Hypertension, Sleep Apnea and Diabetes. ESRD.  Sonographer:    Clayton Lefort RDCS (AE) Referring Phys: [id number] [place]  1. There is significant apical-basal and septal-lateral pacing-induced dyssynchrony. Left ventricular ejection fraction, by estimation, is 40 to 45%. The left ventricle has mildly decreased function. The left ventricle demonstrates regional wall motion abnormalities (see scoring diagram/findings for description). There is moderate concentric left ventricular hypertrophy. Left ventricular diastolic parameters are consistent with Grade II diastolic dysfunction (pseudonormalization). Elevated left atrial pressure. There is moderate dyskinesis of the left ventricular, entire apical segment, although this may be artifactual (pacing related).  2. Right ventricular systolic function is normal. The right ventricular size is normal. Mildly increased right ventricular wall thickness. There is mildly elevated pulmonary artery systolic pressure. The estimated right ventricular systolic pressure is 75.1 mmHg.  3. Left atrial size was mildly dilated.  4. Right atrial size was mildly dilated.  5. The mitral valve is normal in structure. Mild mitral valve regurgitation.  6. Tricuspid valve regurgitation is mild to moderate.  7. The aortic valve is tricuspid. Aortic valve regurgitation is trivial. Very mild aortic valve stenosis.  8. The inferior vena cava is normal in size with greater than 50% respiratory variability, suggesting right atrial pressure of 3 mmHg. Comparison(s): Prior images reviewed side by side. The left ventricular  function is worsened. The difference is at least in part (possibly entirely) due to ventricular pacing (which was present only intermittently on the 03/01/2019 study, but is seen constantly throughout the current study). FINDINGS  Left Ventricle: There is significant apical-basal and septal-lateral pacing-induced dyssynchrony. Left ventricular ejection fraction, by estimation, is 40 to 45%. The left ventricle has mildly decreased function. The left ventricle demonstrates regional  wall motion abnormalities. Moderate dyskinesis of the left ventricular, entire apical segment. The left ventricular internal cavity size was normal in size. There is moderate concentric left ventricular hypertrophy. Abnormal (paradoxical) septal motion,  consistent with RV pacemaker. Left ventricular diastolic parameters are consistent with Grade II diastolic dysfunction (pseudonormalization). Elevated left atrial pressure. Right Ventricle: The right ventricular size is normal. Mildly increased right ventricular wall thickness. Right ventricular systolic function is normal. There is mildly elevated pulmonary artery systolic pressure. The tricuspid regurgitant velocity is 3.02 m/s, and with an assumed right atrial pressure of 3 mmHg, the estimated right ventricular systolic pressure is 70.0 mmHg. Left Atrium: Left atrial size was mildly dilated. Right Atrium: Right atrial size was mildly dilated. Pericardium: A small pericardial effusion is present. The pericardial effusion is circumferential. There is no evidence of cardiac tamponade. Mitral Valve: The mitral valve is normal in structure. Mild mitral valve regurgitation. MV peak gradient, 4.7 mmHg. The mean mitral valve gradient is 2.0 mmHg. Tricuspid Valve: The tricuspid valve is normal in structure. Tricuspid valve regurgitation is mild to moderate. Aortic Valve: The aortic valve is tricuspid. . There is mild thickening and mild calcification of the aortic valve. Aortic valve  regurgitation is trivial. Mild aortic stenosis is present. There is mild thickening of the aortic valve. There is mild calcification of the aortic valve. Aortic valve mean gradient measures 6.7 mmHg. Aortic valve peak gradient measures 14.0 mmHg. Aortic valve area, by VTI measures 1.59 cm. Pulmonic Valve: The pulmonic valve was grossly normal. Pulmonic valve regurgitation is not visualized. Aorta: The aortic root and ascending aorta are structurally normal, with no evidence  of dilitation. Venous: The inferior vena cava is normal in size with greater than 50% respiratory variability, suggesting right atrial pressure of 3 mmHg. IAS/Shunts: No atrial level shunt detected by color flow Doppler. Additional Comments: A pacer wire is visualized.  LEFT VENTRICLE PLAX 2D LVIDd:         4.70 cm      Diastology LVIDs:         3.30 cm      LV e' lateral:   4.95 cm/s LV PW:         1.60 cm      LV E/e' lateral: 20.4 LV IVS:        1.44 cm      LV e' medial:    4.50 cm/s LVOT diam:     2.20 cm      LV E/e' medial:  22.4 LV SV:         62 LV SV Index:   30 LVOT Area:     3.80 cm  LV Volumes (MOD) LV vol d, MOD A2C: 68.6 ml LV vol d, MOD A4C: 140.0 ml LV vol s, MOD A2C: 37.9 ml LV vol s, MOD A4C: 62.2 ml LV SV MOD A2C:     30.7 ml LV SV MOD A4C:     140.0 ml LV SV MOD BP:      51.4 ml RIGHT VENTRICLE             IVC RV Basal diam:  3.20 cm     IVC diam: 1.80 cm RV S prime:     10.20 cm/s TAPSE (M-mode): 2.0 cm LEFT ATRIUM              Index       RIGHT ATRIUM           Index LA diam:        3.70 cm  1.77 cm/m  RA Area:     16.10 cm LA Vol (A2C):   81.5 ml  39.03 ml/m RA Volume:   37.70 ml  18.05 ml/m LA Vol (A4C):   108.0 ml 51.72 ml/m LA Biplane Vol: 95.7 ml  45.83 ml/m  AORTIC VALVE AV Area (Vmax):    1.80 cm AV Area (Vmean):   1.89 cm AV Area (VTI):     1.59 cm AV Vmax:           187.33 cm/s AV Vmean:          117.333 cm/s AV VTI:            0.389 m AV Peak Grad:      14.0 mmHg AV Mean Grad:      6.7 mmHg LVOT Vmax:          88.70 cm/s LVOT Vmean:        58.300 cm/s LVOT VTI:          0.163 m LVOT/AV VTI ratio: 0.42  AORTA Ao Root diam: 3.20 cm Ao Asc diam:  3.00 cm MITRAL VALVE                TRICUSPID VALVE MV Area (PHT): 4.39 cm     TR Peak grad:   36.5 mmHg MV Peak grad:  4.7 mmHg     TR Vmax:        302.00 cm/s MV Mean grad:  2.0 mmHg MV Vmax:       1.08 m/s     SHUNTS MV Vmean:  63.4 cm/s    Systemic VTI:  0.16 m MV Decel Time: 173 msec     Systemic Diam: 2.20 cm MV E velocity: 101.00 cm/s MV A velocity: 67.80 cm/s MV E/A ratio:  1.49 Mihai Croitoru MD Electronically signed by Sanda Klein MD Signature Date/Time: 04/02/2020/5:55:21 PM    Final    CUP PACEART INCLINIC DEVICE CHECK  Result Date: 03/29/2020 Pacemaker check in clinic. Normal device function. Thresholds, sensing, impedances consistent with previous measurements. Device programmed to maximize longevity. AMS > 1%. PMT at times, managed by algorithm. No high ventricular rates noted. Device programmed at appropriate safety margins. Histogram distribution appropriate for patient activity level. Estimated longevity 6 yr, 9 mo. Patient enrolled in remote follow-ups. Next remote 05/07/2020. RTC 12 months.  HYBRID OR IMAGING ([facility] ONLY)  Result Date: 03/25/2020 There is no interpretation for this exam.  This order is for images obtained during a surgical procedure.  Please See "Surgeries" Tab for more information regarding the procedure.    Microbiology: Recent Results (from the past 240 hour(s))  SARS Coronavirus 2 by RT PCR (hospital order, performed in [facility] hospital lab) Nasopharyngeal Nasopharyngeal Swab     Status: None   Collection Time: 04/02/20  2:02 AM   Specimen: Nasopharyngeal Swab  Result Value Ref Range Status   SARS Coronavirus 2 NEGATIVE NEGATIVE Final    Comment: (NOTE) SARS-CoV-2 target nucleic acids are NOT DETECTED.  The SARS-CoV-2 RNA is generally detectable in upper and lower respiratory specimens during the acute phase  of infection. The lowest concentration of SARS-CoV-2 viral copies this assay can detect is 250 copies / mL. A negative result does not preclude SARS-CoV-2 infection and should not be used as the sole basis for treatment or other patient management decisions.  A negative result may occur with improper specimen collection / handling, submission of specimen other than nasopharyngeal swab, presence of viral mutation(s) within the areas targeted by this assay, and inadequate number of viral copies (<250 copies / mL). A negative result must be combined with clinical observations, patient history, and epidemiological information.  Fact Sheet for Patients:   StrictlyIdeas.no  Fact Sheet for Healthcare Providers: BankingDealers.co.za  This test is not yet approved or  cleared by the [place] FDA and has been authorized for detection and/or diagnosis of SARS-CoV-2 by FDA under an Emergency Use Authorization (EUA).  This EUA will remain in effect (meaning this test can be used) for the duration of the COVID-19 declaration under Section 564(b)(1) of the Act, 21 U.S.C. section 360bbb-3(b)(1), unless the authorization is terminated or revoked sooner.  Performed at [facility], [place] [address], [place], [place] [address]      Labs: Basic Metabolic Panel: Recent Labs  Lab 04/02/20 0202 04/02/20 0235 04/02/20 1230 04/03/20 0521 04/04/20 0658  NA 136 135  --  136 135  K 3.7 3.4*  --  4.0 4.4  CL 96* 95*  --  97* 99  CO2 25  --   --  26 26  GLUCOSE 96 96  --  71 79  BUN 13 15  --  27* 16  CREATININE 2.69* 2.90*  --  4.33* 3.05*  CALCIUM 8.4*  --   --  8.7* 8.4*  MG  --   --  2.0  --   --    Liver Function Tests: No results for input(s): AST, ALT, ALKPHOS, BILITOT, PROT, ALBUMIN in the last 168 hours. No results for input(s): LIPASE, AMYLASE in the last  168 hours. No results for input(s): AMMONIA in the last 168  hours. CBC: Recent Labs  Lab 04/02/20 0202 04/02/20 0235 04/03/20 0521 04/04/20 0658  WBC 4.0  --  5.0 4.2  HGB 9.5* 10.2* 9.5* 8.0*  HCT 29.2* 30.0* 28.8* 24.9*  MCV 92.4  --  91.4 92.9  PLT 252  --  202 194   Cardiac Enzymes: No results for input(s): CKTOTAL, CKMB, CKMBINDEX, TROPONINI in the last 168 hours. BNP: BNP (last 3 results) Recent Labs    04/02/20 0202  BNP 260.8*    ProBNP (last 3 results) No results for input(s): PROBNP in the last 8760 hours.  CBG: Recent Labs  Lab 04/03/20 1523 04/03/20 1628 04/03/20 2211 04/04/20 0611 04/04/20 1149  GLUCAP 133* 108* 84 90 99    Principal Problem:   Acute respiratory failure with hypoxia (HCC) Active Problems:   Obstructive sleep apnea   Multiple myeloma not having achieved remission (HCC)   ESRD (end stage renal disease) on dialysis (HCC)   Elevated troponin  Time coordinating discharge: 38 mintues.  Signed:        Isaia Hassell, DO [facility]  04/04/2020, 6:33 PM

## 2020-04-05 ENCOUNTER — Telehealth: Payer: Self-pay | Admitting: Physician Assistant

## 2020-04-05 ENCOUNTER — Inpatient Hospital Stay: Payer: Medicare HMO

## 2020-04-05 ENCOUNTER — Other Ambulatory Visit: Payer: Self-pay

## 2020-04-05 ENCOUNTER — Encounter: Payer: Self-pay | Admitting: Hematology and Oncology

## 2020-04-05 ENCOUNTER — Inpatient Hospital Stay: Payer: Medicare HMO | Admitting: Hematology and Oncology

## 2020-04-05 ENCOUNTER — Telehealth: Payer: Self-pay

## 2020-04-05 DIAGNOSIS — M25511 Pain in right shoulder: Secondary | ICD-10-CM | POA: Diagnosis not present

## 2020-04-05 DIAGNOSIS — I952 Hypotension due to drugs: Secondary | ICD-10-CM | POA: Diagnosis not present

## 2020-04-05 DIAGNOSIS — R634 Abnormal weight loss: Secondary | ICD-10-CM | POA: Diagnosis not present

## 2020-04-05 DIAGNOSIS — D61818 Other pancytopenia: Secondary | ICD-10-CM | POA: Diagnosis not present

## 2020-04-05 DIAGNOSIS — C9000 Multiple myeloma not having achieved remission: Secondary | ICD-10-CM | POA: Diagnosis not present

## 2020-04-05 DIAGNOSIS — I252 Old myocardial infarction: Secondary | ICD-10-CM | POA: Diagnosis not present

## 2020-04-05 DIAGNOSIS — R110 Nausea: Secondary | ICD-10-CM | POA: Diagnosis not present

## 2020-04-05 DIAGNOSIS — N186 End stage renal disease: Secondary | ICD-10-CM

## 2020-04-05 DIAGNOSIS — Z992 Dependence on renal dialysis: Secondary | ICD-10-CM

## 2020-04-05 DIAGNOSIS — G893 Neoplasm related pain (acute) (chronic): Secondary | ICD-10-CM | POA: Diagnosis not present

## 2020-04-05 DIAGNOSIS — Z79899 Other long term (current) drug therapy: Secondary | ICD-10-CM | POA: Diagnosis not present

## 2020-04-05 DIAGNOSIS — Z5112 Encounter for antineoplastic immunotherapy: Secondary | ICD-10-CM | POA: Diagnosis not present

## 2020-04-05 DIAGNOSIS — I959 Hypotension, unspecified: Secondary | ICD-10-CM | POA: Diagnosis not present

## 2020-04-05 LAB — CBC WITH DIFFERENTIAL/PLATELET
Abs Immature Granulocytes: 0 10*3/uL (ref 0.00–0.07)
Basophils Absolute: 0 10*3/uL (ref 0.0–0.1)
Basophils Relative: 1 %
Eosinophils Absolute: 0.4 10*3/uL (ref 0.0–0.5)
Eosinophils Relative: 9 %
HCT: 26.9 % — ABNORMAL LOW (ref 36.0–46.0)
Hemoglobin: 8.8 g/dL — ABNORMAL LOW (ref 12.0–15.0)
Immature Granulocytes: 0 %
Lymphocytes Relative: 22 %
Lymphs Abs: 0.9 10*3/uL (ref 0.7–4.0)
MCH: 30.4 pg (ref 26.0–34.0)
MCHC: 32.7 g/dL (ref 30.0–36.0)
MCV: 93.1 fL (ref 80.0–100.0)
Monocytes Absolute: 0.4 10*3/uL (ref 0.1–1.0)
Monocytes Relative: 11 %
Neutro Abs: 2.4 10*3/uL (ref 1.7–7.7)
Neutrophils Relative %: 57 %
Platelets: 191 10*3/uL (ref 150–400)
RBC: 2.89 MIL/uL — ABNORMAL LOW (ref 3.87–5.11)
RDW: 16.6 % — ABNORMAL HIGH (ref 11.5–15.5)
WBC: 4.1 10*3/uL (ref 4.0–10.5)
nRBC: 0 % (ref 0.0–0.2)

## 2020-04-05 LAB — COMPREHENSIVE METABOLIC PANEL
ALT: 13 U/L (ref 0–44)
AST: 18 U/L (ref 15–41)
Albumin: 3.2 g/dL — ABNORMAL LOW (ref 3.5–5.0)
Alkaline Phosphatase: 49 U/L (ref 38–126)
Anion gap: 13 (ref 5–15)
BUN: 34 mg/dL — ABNORMAL HIGH (ref 8–23)
CO2: 26 mmol/L (ref 22–32)
Calcium: 8.5 mg/dL — ABNORMAL LOW (ref 8.9–10.3)
Chloride: 99 mmol/L (ref 98–111)
Creatinine, Ser: 4.78 mg/dL (ref 0.44–1.00)
GFR calc Af Amer: 9 mL/min — ABNORMAL LOW (ref >60)
GFR calc non Af Amer: 8 mL/min — ABNORMAL LOW (ref >60)
Glucose, Bld: 107 mg/dL — ABNORMAL HIGH (ref 70–99)
Potassium: 4.4 mmol/L (ref 3.5–5.1)
Sodium: 138 mmol/L (ref 135–145)
Total Bilirubin: 0.6 mg/dL (ref 0.3–1.2)
Total Protein: 6.7 g/dL (ref 6.5–8.1)

## 2020-04-05 NOTE — Telephone Encounter (Signed)
Critical lab creatinine 4.78 made Nurse Beth aware

## 2020-04-05 NOTE — Assessment & Plan Note (Signed)
We discussed the limitations of treatment options due to her end-stage renal disease °She will continue hemodialysis °

## 2020-04-05 NOTE — Assessment & Plan Note (Signed)
Her recent serum light chains has improved M protein is stable We will continue current treatment and supportive care

## 2020-04-05 NOTE — Assessment & Plan Note (Signed)
Patient was diagnosed with possible adrenal insufficiency She is on chronic steroid therapy

## 2020-04-05 NOTE — Progress Notes (Signed)
[place] OFFICE PROGRESS NOTE  Patient Care Team: Lucianne Lei, MD as PCP - General (Family Medicine) Thompson Grayer, MD as PCP - Electrophysiology (Cardiology) Josue Hector, MD as PCP - Cardiology (Cardiology)  ASSESSMENT & PLAN:  Multiple myeloma not having achieved remission [facility]) Her recent serum light chains has improved M protein is stable We will continue current treatment and supportive care  ESRD (end stage renal disease) on dialysis [facility]) We discussed the limitations of treatment options due to her end-stage renal disease She will continue hemodialysis  Pancytopenia, acquired [facility]) She has multifactorial pancytopenia, likely worsened with recent addition of Revlimid She does not need transfusion support We will monitor closely  Hypotension Patient was diagnosed with possible adrenal insufficiency She is on chronic steroid therapy   No orders of the defined types were placed in this encounter.   All questions were answered. The patient knows to call the clinic with any problems, questions or concerns. The total time spent in the appointment was 20 minutes encounter with patients including review of chart and various tests results, discussions about plan of care and coordination of care plan   Heath Lark, MD 04/05/2020 11:25 AM  INTERVAL HISTORY: Please see below for problem oriented charting. She is here accompanied by her son The patient was discharged from the hospital yesterday for admission due to hypotension and weakness She was diagnosed with adrenal insufficiency A lot of her medications including antihypertensives were discontinued recently She felt a bit better She denies significant pain or nausea The patient denies any recent signs or symptoms of bleeding such as spontaneous epistaxis, hematuria or hematochezia.   SUMMARY OF ONCOLOGIC HISTORY: Oncology History  Multiple myeloma not having achieved remission ([place])  03/03/2019  Initial Diagnosis   Multiple myeloma not having achieved remission ([place])   03/03/2019 Imaging   1. Round and oval lucent/lytic lesions in the skull, bilateral humeri, distal left femur, and possibly also in the pelvis are compatible with multiple myeloma. No right lower extremity lytic lesion identified. No pathologic fracture. 2. Advanced degenerative changes in the spine with no vertebral myeloma evident by plain radiography. 3. Advanced degenerative changes in the right upper extremity. Bilateral knee arthroplasty. 4.  Aortic Atherosclerosis (ICD10-I70.0).   03/04/2019 -  Chemotherapy   The patient had bortezomib for chemotherapy treatment.    03/04/2019 Bone Marrow Biopsy   Bone Marrow, Aspirate,Biopsy, and Clot - HYPERCELLULAR BONE MARROW FOR AGE WITH PLASMACYTOSIS. - SEE COMMENT. PERIPHERAL BLOOD: - NORMOCYTIC-NORMOCHROMIC ANEMIA. Diagnosis Note The bone marrow is hypercellular for age with increased number of atypical plasma cells averaging 30% of all cells in the aspirate associated with interstitial infiltrates and numerous variably sized aggregates in the clot and biopsy sections. 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 Confirmatory in situ hybridization for kappa and lambda as well as CD138 will be performed and the results reported in an addendum. The background shows trilineage hematopoiesis with mild non-specific changes primarily involving the erythroid series. Correlation with cytogenetic and FISH studies is recommended.    06/20/2019 -  Chemotherapy   The patient had dexamethasone (DECADRON) tablet 20 mg, 20 mg (100 % of original dose 20 mg), Oral,  Once, 12 of 14 cycles Dose modification: 20 mg (original dose 20 mg, Cycle 1), 8 mg (original dose 8 mg, Cycle 10) Administration: 20 mg (06/20/2019), 20 mg (06/27/2019), 20 mg (07/04/2019), 20 mg (07/11/2019), 20 mg (07/18/2019), 20 mg (07/25/2019), 20 mg (08/08/2019), 20 mg (08/15/2019), 20 mg (09/05/2019),  20 mg  (12/01/2019), 20 mg (09/26/2019), 20 mg (10/20/2019), 20 mg (11/10/2019), 20 mg (12/22/2019), 20 mg (01/12/2020), 8 mg (02/02/2020), 8 mg (02/23/2020), 8 mg (03/22/2020) daratumumab-hyaluronidase-fihj (DARZALEX FASPRO) 1800-30000 MG-UT/15ML chemo SQ injection 1,800 mg, 1,800 mg, Subcutaneous,  Once, 12 of 14 cycles Administration: 1,800 mg (06/20/2019), 1,800 mg (06/27/2019), 1,800 mg (07/04/2019), 1,800 mg (07/11/2019), 1,800 mg (07/18/2019), 1,800 mg (07/25/2019), 1,800 mg (08/08/2019), 1,800 mg (08/15/2019), 1,800 mg (09/05/2019), 1,800 mg (12/01/2019), 1,800 mg (02/02/2020), 1,800 mg (09/26/2019), 1,800 mg (10/20/2019), 1,800 mg (11/10/2019), 1,800 mg (12/22/2019), 1,800 mg (01/12/2020), 1,800 mg (02/23/2020), 1,800 mg (03/22/2020)  for chemotherapy treatment.      REVIEW OF SYSTEMS:   Constitutional: Denies fevers, chills or abnormal weight loss Eyes: Denies blurriness of vision Ears, nose, mouth, throat, and face: Denies mucositis or sore throat Respiratory: Denies cough, dyspnea or wheezes Cardiovascular: Denies palpitation, chest discomfort or lower extremity swelling Gastrointestinal:  Denies nausea, heartburn or change in bowel habits Skin: Denies abnormal skin rashes Lymphatics: Denies new lymphadenopathy or easy bruising Neurological:Denies numbness, tingling or new weaknesses Behavioral/Psych: Mood is stable, no new changes  All other systems were reviewed with the patient and are negative.  I have reviewed the past medical history, past surgical history, social history and family history with the patient and they are unchanged from previous note.  ALLERGIES:  is allergic to aspirin, hydrocodone-acetaminophen, morphine, penicillins, betaine, dacarbazine, ketorolac tromethamine, brimonidine, diltiazem hcl, hydrocodone-acetaminophen, neurontin [gabapentin], and sulfacetamide sodium.  MEDICATIONS:  Current Outpatient Medications  Medication Sig Dispense Refill  . acetaminophen (TYLENOL) 500 MG tablet Take  1,000 mg by mouth 2 (two) times daily as needed for moderate pain.     Marland Kitchen acyclovir (ZOVIRAX) 400 MG tablet TAKE 1 TABLET BY MOUTH EVERY DAY (Patient taking differently: Take 400 mg by mouth daily. ) 90 tablet 2  . allopurinol (ZYLOPRIM) 100 MG tablet Take 0.5 tablets (50 mg total) by mouth daily. 30 tablet 0  . aspirin EC 81 MG tablet Take 81 mg by mouth daily. Swallow whole.    . B Complex-C-Zn-Folic Acid (DIALYVITE 270-WCBJ 15) 0.8 MG TABS Take 1 tablet by mouth every evening.     . cholecalciferol (VITAMIN D3) 25 MCG (1000 UT) tablet Take 1,000 Units by mouth daily.     Marland Kitchen dexamethasone (DECADRON) 2 MG tablet Take 1 tablet (2 mg total) by mouth every other day. 30 tablet 0  . dorzolamide (TRUSOPT) 2 % ophthalmic solution Place 1 drop into the left eye 2 (two) times daily.   1  . fluorometholone (FML) 0.1 % ophthalmic suspension Place 1 drop into the right eye daily.     Marland Kitchen lactulose (CHRONULAC) 10 GM/15ML solution Take 10 g by mouth as needed for mild constipation.     Marland Kitchen lenalidomide (REVLIMID) 2.5 MG capsule Take 1 capsule three times a week after dialysis on Tuesday, Thursday and Saturdays 12 capsule 0  . LUMIGAN 0.01 % SOLN Place 1 drop into the left eye at bedtime.     Marland Kitchen LYRICA 50 MG capsule Take 50 mg by mouth at bedtime.     . ondansetron (ZOFRAN) 4 MG tablet Take 1 tablet (4 mg total) by mouth every 8 (eight) hours as needed for nausea. 30 tablet 1  . pantoprazole (PROTONIX) 40 MG tablet Take 1 tablet (40 mg total) by mouth daily as needed (acid reflux/indigestion.). 30 tablet 11  . polyethylene glycol (MIRALAX / GLYCOLAX) 17 g packet Take 17 g by mouth daily.     Marland Kitchen  prochlorperazine (COMPAZINE) 5 MG tablet Take 1 tablet (5 mg total) by mouth every 6 (six) hours as needed for nausea or vomiting. 30 tablet 1  . senna-docusate (SENOKOT-S) 8.6-50 MG tablet Take 2 tablets by mouth 2 (two) times daily. 30 tablet 0  . sevelamer carbonate (RENVELA) 800 MG tablet Take 800-1,600 mg by mouth 3 (three)  times daily with meals. Depending on meal size depends on the 1 or 2 tablets    . sodium chloride (OCEAN) 0.65 % SOLN nasal spray Place 1 spray into both nostrils 4 (four) times daily as needed for congestion.     . traMADol (ULTRAM) 50 MG tablet Take 1 tablet (50 mg total) by mouth every 8 (eight) hours as needed. 6 tablet 0   No current facility-administered medications for this visit.    PHYSICAL EXAMINATION: ECOG PERFORMANCE STATUS: 2 - Symptomatic, <50% confined to bed  Vitals:   04/05/20 1036  BP: (!) 111/46  Pulse: 70  Resp: 18  Temp: 98.1 F (36.7 C)  SpO2: 100%   Filed Weights   04/05/20 1036  Weight: 126 lb 3.2 oz (57.2 kg)    GENERAL:alert, no distress and comfortable NEURO: alert & oriented x 3 with fluent speech, no focal motor/sensory deficits  LABORATORY DATA:  I have reviewed the data as listed    Component Value Date/Time   NA 138 04/05/2020 1005   K 4.4 04/05/2020 1005   CL 99 04/05/2020 1005   CO2 26 04/05/2020 1005   GLUCOSE 107 (H) 04/05/2020 1005   BUN 34 (H) 04/05/2020 1005   CREATININE 4.78 (HH) 04/05/2020 1005   CREATININE 2.71 (H) 10/20/2019 1128   CREATININE 1.03 (H) 05/11/2016 0931   CALCIUM 8.5 (L) 04/05/2020 1005   PROT 6.7 04/05/2020 1005   ALBUMIN 3.2 (L) 04/05/2020 1005   AST 18 04/05/2020 1005   AST 18 10/20/2019 1128   ALT 13 04/05/2020 1005   ALT 10 10/20/2019 1128   ALKPHOS 49 04/05/2020 1005   BILITOT 0.6 04/05/2020 1005   BILITOT 0.4 10/20/2019 1128   GFRNONAA 8 (L) 04/05/2020 1005   GFRNONAA 15 (L) 10/20/2019 1128   GFRAA 9 (L) 04/05/2020 1005   GFRAA 18 (L) 10/20/2019 1128    No results found for: SPEP, UPEP  Lab Results  Component Value Date   WBC 4.1 04/05/2020   NEUTROABS 2.4 04/05/2020   HGB 8.8 (L) 04/05/2020   HCT 26.9 (L) 04/05/2020   MCV 93.1 04/05/2020   PLT 191 04/05/2020      Chemistry      Component Value Date/Time   NA 138 04/05/2020 1005   K 4.4 04/05/2020 1005   CL 99 04/05/2020 1005    CO2 26 04/05/2020 1005   BUN 34 (H) 04/05/2020 1005   CREATININE 4.78 (HH) 04/05/2020 1005   CREATININE 2.71 (H) 10/20/2019 1128   CREATININE 1.03 (H) 05/11/2016 0931      Component Value Date/Time   CALCIUM 8.5 (L) 04/05/2020 1005   ALKPHOS 49 04/05/2020 1005   AST 18 04/05/2020 1005   AST 18 10/20/2019 1128   ALT 13 04/05/2020 1005   ALT 10 10/20/2019 1128   BILITOT 0.6 04/05/2020 1005   BILITOT 0.4 10/20/2019 1128

## 2020-04-05 NOTE — Assessment & Plan Note (Signed)
She has multifactorial pancytopenia, likely worsened with recent addition of Revlimid She does not need transfusion support We will monitor closely

## 2020-04-05 NOTE — Telephone Encounter (Signed)
Transition of care contact from inpatient facility  Date of discharge: 04/04/20 Date of contact:  04/05/20 Method: Phone Spoke to: Patient  Patient contacted to discuss transition of care from recent inpatient hospitalization. Patient was admitted to [facility] from 04/02/20-04/04/20 with discharge diagnosis of hypotension, adrenal insufficiency, pulmonary edema and pancytopenia 2/2 multiple myeloma.   Medication changes were reviewed. Reviewed follow up appointments and referred patient to endocrinology for adrenal insufficiency.  Patient will follow up with his/her outpatient HD unit on: 04/06/20  Anice Paganini, PA-C 04/05/2020, 2:43 PM  [facility] Pager: [phone]

## 2020-04-06 ENCOUNTER — Encounter: Payer: Self-pay | Admitting: Hematology and Oncology

## 2020-04-06 NOTE — Progress Notes (Signed)
Pharmacist Chemotherapy Monitoring - Follow Up Assessment    I verify that I have reviewed each item in the below checklist:  . Regimen for the patient is scheduled for the appropriate day and plan matches scheduled date. Marland Kitchen Appropriate non-routine labs are ordered dependent on drug ordered. . If applicable, additional medications reviewed and ordered per protocol based on lifetime cumulative doses and/or treatment regimen.   Plan for follow-up and/or issues identified: No . I-vent associated with next due treatment: No . MD and/or nursing notified: No  [name] 04/06/2020 1:18 PM

## 2020-04-07 ENCOUNTER — Telehealth: Payer: Self-pay | Admitting: Hematology and Oncology

## 2020-04-07 NOTE — Telephone Encounter (Signed)
Scheduled appt per 6/21 sch message - pt is aware of appts added.

## 2020-04-12 ENCOUNTER — Inpatient Hospital Stay: Payer: Medicare HMO

## 2020-04-12 ENCOUNTER — Other Ambulatory Visit: Payer: Self-pay

## 2020-04-12 VITALS — BP 124/60 | HR 72 | Temp 98.1°F | Resp 18

## 2020-04-12 DIAGNOSIS — Z5112 Encounter for antineoplastic immunotherapy: Secondary | ICD-10-CM | POA: Diagnosis not present

## 2020-04-12 DIAGNOSIS — Z7189 Other specified counseling: Secondary | ICD-10-CM

## 2020-04-12 DIAGNOSIS — C9000 Multiple myeloma not having achieved remission: Secondary | ICD-10-CM

## 2020-04-12 DIAGNOSIS — R110 Nausea: Secondary | ICD-10-CM

## 2020-04-12 LAB — CBC WITH DIFFERENTIAL/PLATELET
Abs Immature Granulocytes: 0 10*3/uL (ref 0.00–0.07)
Basophils Absolute: 0.1 10*3/uL (ref 0.0–0.1)
Basophils Relative: 1 %
Eosinophils Absolute: 0.4 10*3/uL (ref 0.0–0.5)
Eosinophils Relative: 8 %
HCT: 28.4 % — ABNORMAL LOW (ref 36.0–46.0)
Hemoglobin: 9 g/dL — ABNORMAL LOW (ref 12.0–15.0)
Immature Granulocytes: 0 %
Lymphocytes Relative: 23 %
Lymphs Abs: 1 10*3/uL (ref 0.7–4.0)
MCH: 30.2 pg (ref 26.0–34.0)
MCHC: 31.7 g/dL (ref 30.0–36.0)
MCV: 95.3 fL (ref 80.0–100.0)
Monocytes Absolute: 0.6 10*3/uL (ref 0.1–1.0)
Monocytes Relative: 14 %
Neutro Abs: 2.4 10*3/uL (ref 1.7–7.7)
Neutrophils Relative %: 54 %
Platelets: 176 10*3/uL (ref 150–400)
RBC: 2.98 MIL/uL — ABNORMAL LOW (ref 3.87–5.11)
RDW: 15.8 % — ABNORMAL HIGH (ref 11.5–15.5)
WBC: 4.4 10*3/uL (ref 4.0–10.5)
nRBC: 0 % (ref 0.0–0.2)

## 2020-04-12 LAB — COMPREHENSIVE METABOLIC PANEL
ALT: 17 U/L (ref 0–44)
AST: 19 U/L (ref 15–41)
Albumin: 3.3 g/dL — ABNORMAL LOW (ref 3.5–5.0)
Alkaline Phosphatase: 48 U/L (ref 38–126)
Anion gap: 14 (ref 5–15)
BUN: 39 mg/dL — ABNORMAL HIGH (ref 8–23)
CO2: 32 mmol/L (ref 22–32)
Calcium: 8.4 mg/dL — ABNORMAL LOW (ref 8.9–10.3)
Chloride: 96 mmol/L — ABNORMAL LOW (ref 98–111)
Creatinine, Ser: 5.28 mg/dL (ref 0.44–1.00)
GFR calc Af Amer: 8 mL/min — ABNORMAL LOW (ref >60)
GFR calc non Af Amer: 7 mL/min — ABNORMAL LOW (ref >60)
Glucose, Bld: 109 mg/dL — ABNORMAL HIGH (ref 70–99)
Potassium: 3.6 mmol/L (ref 3.5–5.1)
Sodium: 142 mmol/L (ref 135–145)
Total Bilirubin: 0.5 mg/dL (ref 0.3–1.2)
Total Protein: 6.6 g/dL (ref 6.5–8.1)

## 2020-04-12 MED ORDER — CYANOCOBALAMIN 1000 MCG/ML IJ SOLN
1000.0000 ug | Freq: Once | INTRAMUSCULAR | Status: AC
  Administered 2020-04-12: 1000 ug via INTRAMUSCULAR

## 2020-04-12 MED ORDER — ACETAMINOPHEN 325 MG PO TABS
ORAL_TABLET | ORAL | Status: AC
  Filled 2020-04-12: qty 2

## 2020-04-12 MED ORDER — PROCHLORPERAZINE MALEATE 10 MG PO TABS
ORAL_TABLET | ORAL | Status: AC
  Filled 2020-04-12: qty 1

## 2020-04-12 MED ORDER — PROCHLORPERAZINE MALEATE 10 MG PO TABS
10.0000 mg | ORAL_TABLET | Freq: Once | ORAL | Status: AC
  Administered 2020-04-12: 10 mg via ORAL

## 2020-04-12 MED ORDER — DEXAMETHASONE 4 MG PO TABS
ORAL_TABLET | ORAL | Status: AC
  Filled 2020-04-12: qty 2

## 2020-04-12 MED ORDER — DIPHENHYDRAMINE HCL 25 MG PO CAPS
25.0000 mg | ORAL_CAPSULE | Freq: Once | ORAL | Status: AC
  Administered 2020-04-12: 25 mg via ORAL

## 2020-04-12 MED ORDER — CYANOCOBALAMIN 1000 MCG/ML IJ SOLN
INTRAMUSCULAR | Status: AC
  Filled 2020-04-12: qty 1

## 2020-04-12 MED ORDER — BORTEZOMIB CHEMO SQ INJECTION 3.5 MG (2.5MG/ML)
0.7800 mg/m2 | Freq: Once | INTRAMUSCULAR | Status: AC
  Administered 2020-04-12: 1.25 mg via SUBCUTANEOUS
  Filled 2020-04-12: qty 0.5

## 2020-04-12 MED ORDER — DIPHENHYDRAMINE HCL 25 MG PO CAPS
ORAL_CAPSULE | ORAL | Status: AC
  Filled 2020-04-12: qty 1

## 2020-04-12 MED ORDER — DEXAMETHASONE 4 MG PO TABS
8.0000 mg | ORAL_TABLET | Freq: Once | ORAL | Status: AC
  Administered 2020-04-12: 8 mg via ORAL

## 2020-04-12 MED ORDER — ACETAMINOPHEN 325 MG PO TABS
650.0000 mg | ORAL_TABLET | Freq: Once | ORAL | Status: AC
  Administered 2020-04-12: 650 mg via ORAL

## 2020-04-12 MED ORDER — DARATUMUMAB-HYALURONIDASE-FIHJ 1800-30000 MG-UT/15ML ~~LOC~~ SOLN
1800.0000 mg | Freq: Once | SUBCUTANEOUS | Status: AC
  Administered 2020-04-12: 1800 mg via SUBCUTANEOUS
  Filled 2020-04-12: qty 15

## 2020-04-12 NOTE — Patient Instructions (Signed)
[facility] Discharge Instructions for Patients Receiving Chemotherapy  Today you received the following chemotherapy agents: Velcade and Darzalex Faspro  To help prevent nausea and vomiting after your treatment, we encourage you to take your nausea medication  as prescribed.    If you develop nausea and vomiting that is not controlled by your nausea medication, call the clinic.   BELOW ARE SYMPTOMS THAT SHOULD BE REPORTED IMMEDIATELY:  *FEVER GREATER THAN 100.5 F  *CHILLS WITH OR WITHOUT FEVER  NAUSEA AND VOMITING THAT IS NOT CONTROLLED WITH YOUR NAUSEA MEDICATION  *UNUSUAL SHORTNESS OF BREATH  *UNUSUAL BRUISING OR BLEEDING  TENDERNESS IN MOUTH AND THROAT WITH OR WITHOUT PRESENCE OF ULCERS  *URINARY PROBLEMS  *BOWEL PROBLEMS  UNUSUAL RASH Items with * indicate a potential emergency and should be followed up as soon as possible.  Feel free to call the clinic should you have any questions or concerns. The clinic phone number is [phone].  Please show the CHEMO ALERT CARD at check-in to the Emergency Department and triage nurse.   

## 2020-04-15 ENCOUNTER — Other Ambulatory Visit: Payer: Self-pay | Admitting: Hematology and Oncology

## 2020-05-03 ENCOUNTER — Other Ambulatory Visit: Payer: Self-pay

## 2020-05-03 ENCOUNTER — Encounter: Payer: Self-pay | Admitting: Hematology and Oncology

## 2020-05-03 ENCOUNTER — Inpatient Hospital Stay: Payer: Medicare HMO | Attending: Hematology and Oncology

## 2020-05-03 ENCOUNTER — Inpatient Hospital Stay: Payer: Medicare HMO

## 2020-05-03 ENCOUNTER — Inpatient Hospital Stay (HOSPITAL_BASED_OUTPATIENT_CLINIC_OR_DEPARTMENT_OTHER): Payer: Medicare HMO | Admitting: Hematology and Oncology

## 2020-05-03 DIAGNOSIS — C9000 Multiple myeloma not having achieved remission: Secondary | ICD-10-CM | POA: Diagnosis not present

## 2020-05-03 DIAGNOSIS — K5909 Other constipation: Secondary | ICD-10-CM

## 2020-05-03 DIAGNOSIS — D61818 Other pancytopenia: Secondary | ICD-10-CM | POA: Insufficient documentation

## 2020-05-03 DIAGNOSIS — N186 End stage renal disease: Secondary | ICD-10-CM

## 2020-05-03 DIAGNOSIS — Z7982 Long term (current) use of aspirin: Secondary | ICD-10-CM | POA: Diagnosis not present

## 2020-05-03 DIAGNOSIS — Z992 Dependence on renal dialysis: Secondary | ICD-10-CM | POA: Diagnosis not present

## 2020-05-03 DIAGNOSIS — Z5112 Encounter for antineoplastic immunotherapy: Secondary | ICD-10-CM | POA: Insufficient documentation

## 2020-05-03 DIAGNOSIS — Z79899 Other long term (current) drug therapy: Secondary | ICD-10-CM | POA: Diagnosis not present

## 2020-05-03 DIAGNOSIS — R110 Nausea: Secondary | ICD-10-CM

## 2020-05-03 DIAGNOSIS — Z7189 Other specified counseling: Secondary | ICD-10-CM

## 2020-05-03 LAB — CBC WITH DIFFERENTIAL/PLATELET
Abs Immature Granulocytes: 0 10*3/uL (ref 0.00–0.07)
Basophils Absolute: 0.1 10*3/uL (ref 0.0–0.1)
Basophils Relative: 2 %
Eosinophils Absolute: 0.2 10*3/uL (ref 0.0–0.5)
Eosinophils Relative: 6 %
HCT: 33.3 % — ABNORMAL LOW (ref 36.0–46.0)
Hemoglobin: 10.7 g/dL — ABNORMAL LOW (ref 12.0–15.0)
Immature Granulocytes: 0 %
Lymphocytes Relative: 28 %
Lymphs Abs: 1.1 10*3/uL (ref 0.7–4.0)
MCH: 30.1 pg (ref 26.0–34.0)
MCHC: 32.1 g/dL (ref 30.0–36.0)
MCV: 93.5 fL (ref 80.0–100.0)
Monocytes Absolute: 0.5 10*3/uL (ref 0.1–1.0)
Monocytes Relative: 13 %
Neutro Abs: 2 10*3/uL (ref 1.7–7.7)
Neutrophils Relative %: 51 %
Platelets: 174 10*3/uL (ref 150–400)
RBC: 3.56 MIL/uL — ABNORMAL LOW (ref 3.87–5.11)
RDW: 14.5 % (ref 11.5–15.5)
WBC: 3.9 10*3/uL — ABNORMAL LOW (ref 4.0–10.5)
nRBC: 0 % (ref 0.0–0.2)

## 2020-05-03 LAB — COMPREHENSIVE METABOLIC PANEL
ALT: 16 U/L (ref 0–44)
AST: 22 U/L (ref 15–41)
Albumin: 3.4 g/dL — ABNORMAL LOW (ref 3.5–5.0)
Alkaline Phosphatase: 50 U/L (ref 38–126)
Anion gap: 14 (ref 5–15)
BUN: 42 mg/dL — ABNORMAL HIGH (ref 8–23)
CO2: 28 mmol/L (ref 22–32)
Calcium: 8.9 mg/dL (ref 8.9–10.3)
Chloride: 96 mmol/L — ABNORMAL LOW (ref 98–111)
Creatinine, Ser: 5.16 mg/dL (ref 0.44–1.00)
GFR calc Af Amer: 8 mL/min — ABNORMAL LOW (ref >60)
GFR calc non Af Amer: 7 mL/min — ABNORMAL LOW (ref >60)
Glucose, Bld: 118 mg/dL — ABNORMAL HIGH (ref 70–99)
Potassium: 4.3 mmol/L (ref 3.5–5.1)
Sodium: 138 mmol/L (ref 135–145)
Total Bilirubin: 0.5 mg/dL (ref 0.3–1.2)
Total Protein: 6.7 g/dL (ref 6.5–8.1)

## 2020-05-03 MED ORDER — ACETAMINOPHEN 325 MG PO TABS
ORAL_TABLET | ORAL | Status: AC
  Filled 2020-05-03: qty 2

## 2020-05-03 MED ORDER — PROCHLORPERAZINE MALEATE 10 MG PO TABS
10.0000 mg | ORAL_TABLET | Freq: Once | ORAL | Status: AC
  Administered 2020-05-03: 10 mg via ORAL

## 2020-05-03 MED ORDER — BORTEZOMIB CHEMO SQ INJECTION 3.5 MG (2.5MG/ML)
0.7800 mg/m2 | Freq: Once | INTRAMUSCULAR | Status: AC
  Administered 2020-05-03: 1.25 mg via SUBCUTANEOUS
  Filled 2020-05-03: qty 0.5

## 2020-05-03 MED ORDER — DIPHENHYDRAMINE HCL 25 MG PO CAPS
ORAL_CAPSULE | ORAL | Status: AC
  Filled 2020-05-03: qty 1

## 2020-05-03 MED ORDER — DIPHENHYDRAMINE HCL 25 MG PO CAPS
25.0000 mg | ORAL_CAPSULE | Freq: Once | ORAL | Status: AC
  Administered 2020-05-03: 25 mg via ORAL

## 2020-05-03 MED ORDER — PROCHLORPERAZINE MALEATE 10 MG PO TABS
ORAL_TABLET | ORAL | Status: AC
  Filled 2020-05-03: qty 1

## 2020-05-03 MED ORDER — CYANOCOBALAMIN 1000 MCG/ML IJ SOLN
1000.0000 ug | Freq: Once | INTRAMUSCULAR | Status: AC
  Administered 2020-05-03: 1000 ug via INTRAMUSCULAR

## 2020-05-03 MED ORDER — DEXAMETHASONE 4 MG PO TABS
8.0000 mg | ORAL_TABLET | Freq: Once | ORAL | Status: AC
  Administered 2020-05-03: 8 mg via ORAL

## 2020-05-03 MED ORDER — DARATUMUMAB-HYALURONIDASE-FIHJ 1800-30000 MG-UT/15ML ~~LOC~~ SOLN
1800.0000 mg | Freq: Once | SUBCUTANEOUS | Status: AC
  Administered 2020-05-03: 1800 mg via SUBCUTANEOUS
  Filled 2020-05-03: qty 15

## 2020-05-03 MED ORDER — CYANOCOBALAMIN 1000 MCG/ML IJ SOLN
INTRAMUSCULAR | Status: AC
  Filled 2020-05-03: qty 1

## 2020-05-03 MED ORDER — DEXAMETHASONE 4 MG PO TABS
ORAL_TABLET | ORAL | Status: AC
  Filled 2020-05-03: qty 2

## 2020-05-03 MED ORDER — ACETAMINOPHEN 325 MG PO TABS
650.0000 mg | ORAL_TABLET | Freq: Once | ORAL | Status: AC
  Administered 2020-05-03: 650 mg via ORAL

## 2020-05-03 NOTE — Assessment & Plan Note (Signed)
We discussed the limitations of treatment options due to her end-stage renal disease °She will continue hemodialysis °

## 2020-05-03 NOTE — Progress Notes (Signed)
[facility] OFF[facility] PROGRESS NOTE  Patient Care Team: Lucianne Lei, MD as PCP - General (Family Medicine) Thompson Grayer, MD as PCP - Electrophysiology (Cardiology) Josue Hector, MD as PCP - Cardiology (Cardiology)  ASSESSMENT & PLAN:  Multiple myeloma not having achieved remission [facility]) Repeat myeloma panel is pending Clinically, she is improving Her pancytopenia is stable She will continue current treatment as scheduled I do not plan to taper dexamethasone yet  Pancytopenia, acquired [facility]) She has multifactorial pancytopenia, likely worsened with recent addition of Revlimid She does not need transfusion support We will monitor closely  ESRD (end stage renal disease) on dialysis [facility]) We discussed the limitations of treatment options due to her end-stage renal disease She will continue hemodialysis  Other constipation She has severe, recurrent constipation We have extensive discussions today about laxative therapy   No orders of the defined types were placed in this encounter.   All questions were answered. The patient knows to call the clinic with any problems, questions or concerns. The total time spent in the appointment was 20 minutes encounter with patients including review of chart and various tests results, discussions about plan of care and coordination of care plan   Heath Lark, MD 05/03/2020 3:05 PM  INTERVAL HISTORY: Please see below for problem oriented charting. She returns with her son for further treatment and follow-up She has severe constipation since a week ago No nausea or vomiting She is tolerating dialysis well Her appetite is improved She denies pain  SUMMARY OF ONCOLOGIC HISTORY: Oncology History  Multiple myeloma not having achieved remission ([place])  03/03/2019 Initial Diagnosis   Multiple myeloma not having achieved remission ([place])   03/03/2019 Imaging   1. Round and oval lucent/lytic lesions in the skull, bilateral humeri,  distal left femur, and possibly also in the pelvis are compatible with multiple myeloma. No right lower extremity lytic lesion identified. No pathologic fracture. 2. Advanced degenerative changes in the spine with no vertebral myeloma evident by plain radiography. 3. Advanced degenerative changes in the right upper extremity. Bilateral knee arthroplasty. 4.  Aortic Atherosclerosis (ICD10-I70.0).   03/04/2019 -  Chemotherapy   The patient had bortezomib for chemotherapy treatment.    03/04/2019 Bone Marrow Biopsy   Bone Marrow, Aspirate,Biopsy, and Clot - HYPERCELLULAR BONE MARROW FOR AGE WITH PLASMACYTOSIS. - SEE COMMENT. PERIPHERAL BLOOD: - NORMOCYTIC-NORMOCHROMIC ANEMIA. Diagnosis Note The bone marrow is hypercellular for age with increased number of atypical plasma cells averaging 30% of all cells in the aspirate associated with interstitial infiltrates and numerous variably sized aggregates in the clot and biopsy sections. 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 Confirmatory in situ hybridization for kappa and lambda as well as CD138 will be performed and the results reported in an addendum. The background shows trilineage hematopoiesis with mild non-specific changes primarily involving the erythroid series. Correlation with cytogenetic and FISH studies is recommended.    06/20/2019 -  Chemotherapy   The patient had dexamethasone (DECADRON) tablet 20 mg, 20 mg (100 % of original dose 20 mg), Oral,  Once, 14 of 15 cycles Dose modification: 20 mg (original dose 20 mg, Cycle 1), 8 mg (original dose 8 mg, Cycle 10) Administration: 20 mg (06/20/2019), 20 mg (06/27/2019), 20 mg (07/04/2019), 20 mg (07/11/2019), 20 mg (07/18/2019), 20 mg (07/25/2019), 20 mg (08/08/2019), 20 mg (08/15/2019), 20 mg (09/05/2019), 20 mg (12/01/2019), 20 mg (09/26/2019), 20 mg (10/20/2019), 20 mg (11/10/2019), 20 mg (12/22/2019), 20 mg (01/12/2020), 8 mg (02/02/2020), 8 mg (  02/23/2020), 8 mg (03/22/2020), 8 mg (04/12/2020)  daratumumab-hyaluronidase-fihj (DARZALEX FASPRO) 1800-30000 MG-UT/15ML chemo SQ injection 1,800 mg, 1,800 mg, Subcutaneous,  Once, 14 of 15 cycles Administration: 1,800 mg (06/20/2019), 1,800 mg (06/27/2019), 1,800 mg (07/04/2019), 1,800 mg (07/11/2019), 1,800 mg (07/18/2019), 1,800 mg (07/25/2019), 1,800 mg (08/08/2019), 1,800 mg (08/15/2019), 1,800 mg (09/05/2019), 1,800 mg (12/01/2019), 1,800 mg (02/02/2020), 1,800 mg (09/26/2019), 1,800 mg (10/20/2019), 1,800 mg (11/10/2019), 1,800 mg (12/22/2019), 1,800 mg (01/12/2020), 1,800 mg (02/23/2020), 1,800 mg (03/22/2020), 1,800 mg (04/12/2020)  for chemotherapy treatment.      REVIEW OF SYSTEMS:   Constitutional: Denies fevers, chills or abnormal weight loss Eyes: Denies blurriness of vision Ears, nose, mouth, throat, and face: Denies mucositis or sore throat Respiratory: Denies cough, dyspnea or wheezes Cardiovascular: Denies palpitation, chest discomfort or lower extremity swelling Skin: Denies abnormal skin rashes Lymphatics: Denies new lymphadenopathy or easy bruising Neurological:Denies numbness, tingling or new weaknesses Behavioral/Psych: Mood is stable, no new changes  All other systems were reviewed with the patient and are negative.  I have reviewed the past medical history, past surgical history, social history and family history with the patient and they are unchanged from previous note.  ALLERGIES:  is allergic to aspirin, hydrocodone-acetaminophen, morphine, penicillins, betaine, dacarbazine, ketorolac tromethamine, brimonidine, diltiazem hcl, hydrocodone-acetaminophen, neurontin [gabapentin], and sulfacetamide sodium.  MEDICATIONS:  Current Outpatient Medications  Medication Sig Dispense Refill  . acetaminophen (TYLENOL) 500 MG tablet Take 1,000 mg by mouth 2 (two) times daily as needed for moderate pain.     Marland Kitchen acyclovir (ZOVIRAX) 400 MG tablet Take 1 tablet (400 mg total) by mouth daily. 90 tablet 6  . allopurinol (ZYLOPRIM) 100 MG tablet Take  0.5 tablets (50 mg total) by mouth daily. 30 tablet 0  . aspirin EC 81 MG tablet Take 81 mg by mouth daily. Swallow whole.    . B Complex-C-Zn-Folic Acid (DIALYVITE 675-QGBE 15) 0.8 MG TABS Take 1 tablet by mouth every evening.     . cholecalciferol (VITAMIN D3) 25 MCG (1000 UT) tablet Take 1,000 Units by mouth daily.     Marland Kitchen dexamethasone (DECADRON) 2 MG tablet Take 1 tablet (2 mg total) by mouth every other day. 30 tablet 0  . dorzolamide (TRUSOPT) 2 % ophthalmic solution Place 1 drop into the left eye 2 (two) times daily.   1  . fluorometholone (FML) 0.1 % ophthalmic suspension Place 1 drop into the right eye daily.     Marland Kitchen lactulose (CHRONULAC) 10 GM/15ML solution Take 10 g by mouth as needed for mild constipation.     Marland Kitchen lenalidomide (REVLIMID) 2.5 MG capsule Take 1 capsule three times a week after dialysis on Tuesday, Thursday and Saturdays 12 capsule 0  . LUMIGAN 0.01 % SOLN Place 1 drop into the left eye at bedtime.     Marland Kitchen LYRICA 50 MG capsule Take 50 mg by mouth at bedtime.     . ondansetron (ZOFRAN) 4 MG tablet Take 1 tablet (4 mg total) by mouth every 8 (eight) hours as needed for nausea. 30 tablet 1  . pantoprazole (PROTONIX) 40 MG tablet Take 1 tablet (40 mg total) by mouth daily as needed (acid reflux/indigestion.). 30 tablet 11  . polyethylene glycol (MIRALAX / GLYCOLAX) 17 g packet Take 17 g by mouth daily.     . prochlorperazine (COMPAZINE) 5 MG tablet Take 1 tablet (5 mg total) by mouth every 6 (six) hours as needed for nausea or vomiting. 30 tablet 1  . senna-docusate (SENOKOT-S) 8.6-50 MG tablet Take 2  tablets by mouth 2 (two) times daily. 30 tablet 0  . sevelamer carbonate (RENVELA) 800 MG tablet Take 800-1,600 mg by mouth 3 (three) times daily with meals. Depending on meal size depends on the 1 or 2 tablets    . sodium chloride (OCEAN) 0.65 % SOLN nasal spray Place 1 spray into both nostrils 4 (four) times daily as needed for congestion.     . traMADol (ULTRAM) 50 MG tablet Take 1  tablet (50 mg total) by mouth every 8 (eight) hours as needed. 6 tablet 0   No current facility-administered medications for this visit.   Facility-Administered Medications Ordered in Other Visits  Medication Dose Route Frequency Provider Last Rate Last Admin  . cyanocobalamin ((VITAMIN B-12)) injection 1,000 mcg  1,000 mcg Intramuscular Once Alvy Bimler, Suhana Wilner, MD        PHYSICAL EXAMINATION: ECOG PERFORMANCE STATUS: 2 - Symptomatic, <50% confined to bed  Vitals:   05/03/20 1206  BP: (!) 116/42  Pulse: 71  Resp: 18  Temp: 98.3 F (36.8 C)  SpO2: 100%   Filed Weights   05/03/20 1206  Weight: 125 lb (56.7 kg)    GENERAL:alert, no distress and comfortable Musculoskeletal:no cyanosis of digits and no clubbing  NEURO: alert & oriented x 3 with fluent speech, no focal motor/sensory deficits  LABORATORY DATA:  I have reviewed the data as listed    Component Value Date/Time   NA 138 05/03/2020 1137   K 4.3 05/03/2020 1137   CL 96 (L) 05/03/2020 1137   CO2 28 05/03/2020 1137   GLUCOSE 118 (H) 05/03/2020 1137   BUN 42 (H) 05/03/2020 1137   CREATININE 5.16 (HH) 05/03/2020 1137   CREATININE 2.71 (H) 10/20/2019 1128   CREATININE 1.03 (H) 05/11/2016 0931   CALCIUM 8.9 05/03/2020 1137   PROT 6.7 05/03/2020 1137   ALBUMIN 3.4 (L) 05/03/2020 1137   AST 22 05/03/2020 1137   AST 18 10/20/2019 1128   ALT 16 05/03/2020 1137   ALT 10 10/20/2019 1128   ALKPHOS 50 05/03/2020 1137   BILITOT 0.5 05/03/2020 1137   BILITOT 0.4 10/20/2019 1128   GFRNONAA 7 (L) 05/03/2020 1137   GFRNONAA 15 (L) 10/20/2019 1128   GFRAA 8 (L) 05/03/2020 1137   GFRAA 18 (L) 10/20/2019 1128    No results found for: SPEP, UPEP  Lab Results  Component Value Date   WBC 3.9 (L) 05/03/2020   NEUTROABS 2.0 05/03/2020   HGB 10.7 (L) 05/03/2020   HCT 33.3 (L) 05/03/2020   MCV 93.5 05/03/2020   PLT 174 05/03/2020      Chemistry      Component Value Date/Time   NA 138 05/03/2020 1137   K 4.3 05/03/2020 1137    CL 96 (L) 05/03/2020 1137   CO2 28 05/03/2020 1137   BUN 42 (H) 05/03/2020 1137   CREATININE 5.16 (HH) 05/03/2020 1137   CREATININE 2.71 (H) 10/20/2019 1128   CREATININE 1.03 (H) 05/11/2016 0931      Component Value Date/Time   CALCIUM 8.9 05/03/2020 1137   ALKPHOS 50 05/03/2020 1137   AST 22 05/03/2020 1137   AST 18 10/20/2019 1128   ALT 16 05/03/2020 1137   ALT 10 10/20/2019 1128   BILITOT 0.5 05/03/2020 1137   BILITOT 0.4 10/20/2019 1128

## 2020-05-03 NOTE — Patient Instructions (Signed)
[facility] Discharge Instructions for Patients Receiving Chemotherapy  Today you received the following chemotherapy agents: Velcade and Darzalex Faspro  To help prevent nausea and vomiting after your treatment, we encourage you to take your nausea medication  as prescribed.    If you develop nausea and vomiting that is not controlled by your nausea medication, call the clinic.   BELOW ARE SYMPTOMS THAT SHOULD BE REPORTED IMMEDIATELY:  *FEVER GREATER THAN 100.5 F  *CHILLS WITH OR WITHOUT FEVER  NAUSEA AND VOMITING THAT IS NOT CONTROLLED WITH YOUR NAUSEA MEDICATION  *UNUSUAL SHORTNESS OF BREATH  *UNUSUAL BRUISING OR BLEEDING  TENDERNESS IN MOUTH AND THROAT WITH OR WITHOUT PRESENCE OF ULCERS  *URINARY PROBLEMS  *BOWEL PROBLEMS  UNUSUAL RASH Items with * indicate a potential emergency and should be followed up as soon as possible.  Feel free to call the clinic should you have any questions or concerns. The clinic phone number is [phone].  Please show the CHEMO ALERT CARD at check-in to the Emergency Department and triage nurse.   

## 2020-05-03 NOTE — Assessment & Plan Note (Signed)
Repeat myeloma panel is pending Clinically, she is improving Her pancytopenia is stable She will continue current treatment as scheduled I do not plan to taper dexamethasone yet

## 2020-05-03 NOTE — Progress Notes (Signed)
Reported creatinine 5.16 to Dr. Alvy Bimler. Per Dr. Alvy Bimler okay to treat today.

## 2020-05-03 NOTE — Assessment & Plan Note (Signed)
She has severe, recurrent constipation We have extensive discussions today about laxative therapy

## 2020-05-03 NOTE — Assessment & Plan Note (Signed)
She has multifactorial pancytopenia, likely worsened with recent addition of Revlimid She does not need transfusion support We will monitor closely

## 2020-05-04 ENCOUNTER — Telehealth: Payer: Self-pay | Admitting: *Deleted

## 2020-05-04 LAB — KAPPA/LAMBDA LIGHT CHAINS
Kappa free light chain: 109.8 mg/L — ABNORMAL HIGH (ref 3.3–19.4)
Kappa, lambda light chain ratio: 5.69 — ABNORMAL HIGH (ref 0.26–1.65)
Lambda free light chains: 19.3 mg/L (ref 5.7–26.3)

## 2020-05-04 NOTE — Telephone Encounter (Signed)
Per Dr.Gorsuch, called to f/u with pt about not having a bm. Pt stated that an enema was administered and was able to have a bm. Pt verbalized understanding and advised to call with any other concerns

## 2020-05-05 ENCOUNTER — Telehealth: Payer: Self-pay

## 2020-05-05 NOTE — Telephone Encounter (Signed)
Faxed requested office note and letter to [place] to the attention of [facility] for w/c and neck brace. Received confirmation.

## 2020-05-06 LAB — MULTIPLE MYELOMA PANEL, SERUM
Albumin SerPl Elph-Mcnc: 3.4 g/dL (ref 2.9–4.4)
Albumin/Glob SerPl: 1.3 (ref 0.7–1.7)
Alpha 1: 0.2 g/dL (ref 0.0–0.4)
Alpha2 Glob SerPl Elph-Mcnc: 0.6 g/dL (ref 0.4–1.0)
B-Globulin SerPl Elph-Mcnc: 1.6 g/dL — ABNORMAL HIGH (ref 0.7–1.3)
Gamma Glob SerPl Elph-Mcnc: 0.3 g/dL — ABNORMAL LOW (ref 0.4–1.8)
Globulin, Total: 2.8 g/dL (ref 2.2–3.9)
IgA: 23 mg/dL — ABNORMAL LOW (ref 64–422)
IgG (Immunoglobin G), Serum: 1424 mg/dL (ref 586–1602)
IgM (Immunoglobulin M), Srm: 12 mg/dL — ABNORMAL LOW (ref 26–217)
M Protein SerPl Elph-Mcnc: 0.6 g/dL — ABNORMAL HIGH
Total Protein ELP: 6.2 g/dL (ref 6.0–8.5)

## 2020-05-07 ENCOUNTER — Other Ambulatory Visit: Payer: Self-pay

## 2020-05-07 ENCOUNTER — Ambulatory Visit (INDEPENDENT_AMBULATORY_CARE_PROVIDER_SITE_OTHER): Payer: Self-pay | Admitting: Physician Assistant

## 2020-05-07 ENCOUNTER — Ambulatory Visit (INDEPENDENT_AMBULATORY_CARE_PROVIDER_SITE_OTHER): Payer: Medicare HMO | Admitting: *Deleted

## 2020-05-07 VITALS — BP 111/54 | HR 70 | Temp 97.8°F | Resp 20 | Ht 65.0 in | Wt 125.0 lb

## 2020-05-07 DIAGNOSIS — I495 Sick sinus syndrome: Secondary | ICD-10-CM | POA: Diagnosis not present

## 2020-05-07 DIAGNOSIS — N186 End stage renal disease: Secondary | ICD-10-CM

## 2020-05-07 DIAGNOSIS — Z992 Dependence on renal dialysis: Secondary | ICD-10-CM

## 2020-05-07 LAB — CUP PACEART REMOTE DEVICE CHECK
Battery Remaining Longevity: 83 mo
Battery Remaining Percentage: 95.5 %
Battery Voltage: 2.96 V
Brady Statistic AP VP Percent: 94 %
Brady Statistic AP VS Percent: 1 %
Brady Statistic AS VP Percent: 5.8 %
Brady Statistic AS VS Percent: 1 %
Brady Statistic RA Percent Paced: 94 %
Brady Statistic RV Percent Paced: 99 %
Date Time Interrogation Session: 20210723023339
Implantable Lead Implant Date: 20100422
Implantable Lead Implant Date: 20100422
Implantable Lead Location: 753859
Implantable Lead Location: 753860
Implantable Pulse Generator Implant Date: 20170801
Lead Channel Impedance Value: 390 Ohm
Lead Channel Impedance Value: 400 Ohm
Lead Channel Pacing Threshold Amplitude: 0.75 V
Lead Channel Pacing Threshold Amplitude: 1 V
Lead Channel Pacing Threshold Pulse Width: 0.5 ms
Lead Channel Pacing Threshold Pulse Width: 0.5 ms
Lead Channel Sensing Intrinsic Amplitude: 11.8 mV
Lead Channel Sensing Intrinsic Amplitude: 3.4 mV
Lead Channel Setting Pacing Amplitude: 2 V
Lead Channel Setting Pacing Amplitude: 2.5 V
Lead Channel Setting Pacing Pulse Width: 0.5 ms
Lead Channel Setting Sensing Sensitivity: 2 mV
Pulse Gen Model: 2272
Pulse Gen Serial Number: 7929552

## 2020-05-07 NOTE — Progress Notes (Signed)
    Postoperative Access Visit   History of Present Illness   [name] is a 86 y.o. year old female who presents for postoperative follow-up for: placement of right innominate vein TDC and revision of right arm AV fistula with conversion to brachial artery to cephalic vein fistula on 2/70/62 by Dr. Donzetta Matters. She had undergone a RUE shuntogram on 03/08/20 by Dr. Donzetta Matters due to difficulty with clearance. This showed diseased fistula anastomosis with diminutive cephalic and basilic veins. She therefore was scheduled to be converted to an upper arm fistula   The patient's wounds are healing well.  The patient notes no steal symptoms.  The patient is able to complete their activities of daily living.  The patient's current symptoms are: numbness of bilateral hands secondary to chemotherapy  She is currently dialyzing via right IJ TDC at the [facility] location on TTS. Catheter has been working well   Physical Examination   Vitals:   05/07/20 1150  BP: (!) 111/54  Pulse: 70  Resp: 20  Temp: 97.8 F (36.6 C)  TempSrc: Temporal  SpO2: 100%  Weight: 125 lb (56.7 kg)  Height: 5\' 5"  (1.651 m)   Body mass index is 20.8 kg/m.  right arm Incision is healing well. There is small little eschar present on medial aspect of incision, 2+ radial pulse, hand grip is 5/5, sensation in digits is intact, palpable thrill, bruit can be auscultated     Medical Decision Making    [name] is a 86 y.o. year old female who presents s/p placement of right innominate vein TDC and revision of right arm AV fistula with conversion to brachial artery to cephalic vein fistula on 3/76/28 by Dr. Donzetta Matters. She is doing well post op. Incision is almost completely healed. No steal symptoms. Fistula is well matured and easily palpable in right upper arm with good thrill.   Patent is without signs or symptoms of steal syndrome  The patient's access will be ready for use immediately  The patient's tunneled  dialysis catheter can be removed when Nephrology is comfortable with the performance of the fistula. We placed catheter so they will contact us to set up removal  The patient may follow up on a prn basis   Karoline Caldwell, PA-C [facility]: [phone]  On Call MD: Dr. Carlis Abbott

## 2020-05-10 NOTE — Progress Notes (Signed)
Remote pacemaker transmission.   

## 2020-05-11 ENCOUNTER — Other Ambulatory Visit: Payer: Self-pay

## 2020-05-11 DIAGNOSIS — C9000 Multiple myeloma not having achieved remission: Secondary | ICD-10-CM

## 2020-05-11 MED ORDER — LENALIDOMIDE 2.5 MG PO CAPS: ORAL_CAPSULE | ORAL | 0 refills | Status: DC

## 2020-05-15 ENCOUNTER — Encounter: Payer: Self-pay | Admitting: Hematology and Oncology

## 2020-05-17 ENCOUNTER — Other Ambulatory Visit: Payer: Self-pay

## 2020-05-17 MED ORDER — LACTULOSE 10 GM/15ML PO SOLN: 10.0000 g | Freq: Every day | ORAL | 1 refills | Status: AC | PRN

## 2020-05-24 ENCOUNTER — Telehealth: Payer: Self-pay

## 2020-05-24 ENCOUNTER — Inpatient Hospital Stay: Payer: Medicare HMO

## 2020-05-24 ENCOUNTER — Inpatient Hospital Stay: Payer: Medicare HMO | Admitting: Hematology and Oncology

## 2020-05-24 ENCOUNTER — Telehealth: Payer: Self-pay | Admitting: Hematology and Oncology

## 2020-05-24 NOTE — Telephone Encounter (Signed)
Called and left a message asking her to call the office back. She was scheduled for labs starting at 1200 today.

## 2020-05-24 NOTE — Telephone Encounter (Signed)
Called son regarding today's appts. He was not aware that she had appts today. Today's appts canceled and scheduling message sent to reschedule to 8/16 Monday per Dr. Alvy Bimler

## 2020-05-24 NOTE — Telephone Encounter (Signed)
Scheduled per 8/9 sch message. Spoke with pt's son and is aware of appts on 8/16.

## 2020-05-26 ENCOUNTER — Other Ambulatory Visit: Payer: Self-pay | Admitting: Hematology and Oncology

## 2020-05-31 ENCOUNTER — Inpatient Hospital Stay: Payer: Medicare HMO | Attending: Hematology and Oncology

## 2020-05-31 ENCOUNTER — Encounter: Payer: Self-pay | Admitting: Hematology and Oncology

## 2020-05-31 ENCOUNTER — Inpatient Hospital Stay (HOSPITAL_BASED_OUTPATIENT_CLINIC_OR_DEPARTMENT_OTHER): Payer: Medicare HMO | Admitting: Hematology and Oncology

## 2020-05-31 ENCOUNTER — Inpatient Hospital Stay: Payer: Medicare HMO

## 2020-05-31 ENCOUNTER — Other Ambulatory Visit: Payer: Self-pay

## 2020-05-31 DIAGNOSIS — Z7189 Other specified counseling: Secondary | ICD-10-CM

## 2020-05-31 DIAGNOSIS — Z5112 Encounter for antineoplastic immunotherapy: Secondary | ICD-10-CM | POA: Diagnosis present

## 2020-05-31 DIAGNOSIS — G893 Neoplasm related pain (acute) (chronic): Secondary | ICD-10-CM

## 2020-05-31 DIAGNOSIS — Z79899 Other long term (current) drug therapy: Secondary | ICD-10-CM | POA: Diagnosis not present

## 2020-05-31 DIAGNOSIS — Z9221 Personal history of antineoplastic chemotherapy: Secondary | ICD-10-CM | POA: Insufficient documentation

## 2020-05-31 DIAGNOSIS — D61818 Other pancytopenia: Secondary | ICD-10-CM | POA: Diagnosis not present

## 2020-05-31 DIAGNOSIS — I132 Hypertensive heart and chronic kidney disease with heart failure and with stage 5 chronic kidney disease, or end stage renal disease: Secondary | ICD-10-CM | POA: Insufficient documentation

## 2020-05-31 DIAGNOSIS — K5909 Other constipation: Secondary | ICD-10-CM

## 2020-05-31 DIAGNOSIS — C9000 Multiple myeloma not having achieved remission: Secondary | ICD-10-CM

## 2020-05-31 DIAGNOSIS — N186 End stage renal disease: Secondary | ICD-10-CM | POA: Insufficient documentation

## 2020-05-31 DIAGNOSIS — R110 Nausea: Secondary | ICD-10-CM

## 2020-05-31 DIAGNOSIS — Z992 Dependence on renal dialysis: Secondary | ICD-10-CM

## 2020-05-31 DIAGNOSIS — Z7982 Long term (current) use of aspirin: Secondary | ICD-10-CM | POA: Diagnosis not present

## 2020-05-31 LAB — CBC WITH DIFFERENTIAL/PLATELET
Abs Immature Granulocytes: 0.01 10*3/uL (ref 0.00–0.07)
Basophils Absolute: 0 10*3/uL (ref 0.0–0.1)
Basophils Relative: 1 %
Eosinophils Absolute: 0.2 10*3/uL (ref 0.0–0.5)
Eosinophils Relative: 6 %
HCT: 35.5 % — ABNORMAL LOW (ref 36.0–46.0)
Hemoglobin: 11.4 g/dL — ABNORMAL LOW (ref 12.0–15.0)
Immature Granulocytes: 0 %
Lymphocytes Relative: 30 %
Lymphs Abs: 0.9 10*3/uL (ref 0.7–4.0)
MCH: 28.8 pg (ref 26.0–34.0)
MCHC: 32.1 g/dL (ref 30.0–36.0)
MCV: 89.6 fL (ref 80.0–100.0)
Monocytes Absolute: 0.5 10*3/uL (ref 0.1–1.0)
Monocytes Relative: 16 %
Neutro Abs: 1.5 10*3/uL — ABNORMAL LOW (ref 1.7–7.7)
Neutrophils Relative %: 47 %
Platelets: 162 10*3/uL (ref 150–400)
RBC: 3.96 MIL/uL (ref 3.87–5.11)
RDW: 14.1 % (ref 11.5–15.5)
WBC: 3.2 10*3/uL — ABNORMAL LOW (ref 4.0–10.5)
nRBC: 0 % (ref 0.0–0.2)

## 2020-05-31 LAB — COMPREHENSIVE METABOLIC PANEL
ALT: 11 U/L (ref 0–44)
AST: 19 U/L (ref 15–41)
Albumin: 3.2 g/dL — ABNORMAL LOW (ref 3.5–5.0)
Alkaline Phosphatase: 53 U/L (ref 38–126)
Anion gap: 12 (ref 5–15)
BUN: 35 mg/dL — ABNORMAL HIGH (ref 8–23)
CO2: 27 mmol/L (ref 22–32)
Calcium: 8.9 mg/dL (ref 8.9–10.3)
Chloride: 96 mmol/L — ABNORMAL LOW (ref 98–111)
Creatinine, Ser: 4.99 mg/dL (ref 0.44–1.00)
GFR calc Af Amer: 8 mL/min — ABNORMAL LOW (ref >60)
GFR calc non Af Amer: 7 mL/min — ABNORMAL LOW (ref >60)
Glucose, Bld: 96 mg/dL (ref 70–99)
Potassium: 3.2 mmol/L — ABNORMAL LOW (ref 3.5–5.1)
Sodium: 135 mmol/L (ref 135–145)
Total Bilirubin: 0.4 mg/dL (ref 0.3–1.2)
Total Protein: 6.4 g/dL — ABNORMAL LOW (ref 6.5–8.1)

## 2020-05-31 MED ORDER — DIPHENHYDRAMINE HCL 25 MG PO CAPS
25.0000 mg | ORAL_CAPSULE | Freq: Once | ORAL | Status: AC
  Administered 2020-05-31: 25 mg via ORAL

## 2020-05-31 MED ORDER — PROCHLORPERAZINE MALEATE 10 MG PO TABS
ORAL_TABLET | ORAL | Status: AC
  Filled 2020-05-31: qty 1

## 2020-05-31 MED ORDER — DIPHENHYDRAMINE HCL 25 MG PO CAPS
ORAL_CAPSULE | ORAL | Status: AC
  Filled 2020-05-31: qty 1

## 2020-05-31 MED ORDER — DARATUMUMAB-HYALURONIDASE-FIHJ 1800-30000 MG-UT/15ML ~~LOC~~ SOLN
1800.0000 mg | Freq: Once | SUBCUTANEOUS | Status: AC
  Administered 2020-05-31: 1800 mg via SUBCUTANEOUS
  Filled 2020-05-31: qty 15

## 2020-05-31 MED ORDER — ACETAMINOPHEN 325 MG PO TABS
650.0000 mg | ORAL_TABLET | Freq: Once | ORAL | Status: AC
  Administered 2020-05-31: 650 mg via ORAL

## 2020-05-31 MED ORDER — CYANOCOBALAMIN 1000 MCG/ML IJ SOLN
1000.0000 ug | Freq: Once | INTRAMUSCULAR | Status: AC
  Administered 2020-05-31: 1000 ug via INTRAMUSCULAR

## 2020-05-31 MED ORDER — PROCHLORPERAZINE MALEATE 10 MG PO TABS
10.0000 mg | ORAL_TABLET | Freq: Once | ORAL | Status: AC
  Administered 2020-05-31: 10 mg via ORAL

## 2020-05-31 MED ORDER — DEXAMETHASONE 4 MG PO TABS
ORAL_TABLET | ORAL | Status: AC
  Filled 2020-05-31: qty 2

## 2020-05-31 MED ORDER — CYANOCOBALAMIN 1000 MCG/ML IJ SOLN
INTRAMUSCULAR | Status: AC
  Filled 2020-05-31: qty 1

## 2020-05-31 MED ORDER — DEXAMETHASONE 4 MG PO TABS
ORAL_TABLET | ORAL | Status: AC
  Filled 2020-05-31: qty 1

## 2020-05-31 MED ORDER — DEXAMETHASONE 4 MG PO TABS
8.0000 mg | ORAL_TABLET | Freq: Once | ORAL | Status: AC
  Administered 2020-05-31: 8 mg via ORAL

## 2020-05-31 MED ORDER — DEXAMETHASONE 2 MG PO TABS
ORAL_TABLET | ORAL | 0 refills | Status: DC
Start: 2020-05-31 — End: 2020-07-14

## 2020-05-31 MED ORDER — BORTEZOMIB CHEMO SQ INJECTION 3.5 MG (2.5MG/ML)
0.7800 mg/m2 | Freq: Once | INTRAMUSCULAR | Status: AC
  Administered 2020-05-31: 1.25 mg via SUBCUTANEOUS
  Filled 2020-05-31: qty 0.5

## 2020-05-31 MED ORDER — ACETAMINOPHEN 325 MG PO TABS
ORAL_TABLET | ORAL | Status: AC
  Filled 2020-05-31: qty 2

## 2020-05-31 NOTE — Assessment & Plan Note (Signed)
She has multifactorial pancytopenia, likely worsened with recent addition of Revlimid She does not need transfusion support We will monitor closely

## 2020-05-31 NOTE — Assessment & Plan Note (Signed)
She has severe, recurrent constipation We have extensive discussions today about laxative therapy

## 2020-05-31 NOTE — Assessment & Plan Note (Signed)
We discussed the limitations of treatment options due to her end-stage renal disease °She will continue hemodialysis °

## 2020-05-31 NOTE — Assessment & Plan Note (Signed)
She will continue taking pain medicine as needed

## 2020-05-31 NOTE — Assessment & Plan Note (Signed)
I have reviewed blood work with the patient and her son She have very good response with the addition of Revlimid recently She has virtually no side effects from therapy We will proceed with Velcade, daratumumab injection every 3 weeks and she will continue to take lenalidomide 3 times a week after dialysis I recommend dexamethasone taper to 2 mg twice a week When I see her back next month, I will get her treatment to once a week and eventual taper off

## 2020-05-31 NOTE — Progress Notes (Signed)
Reported critical creatinine of 4.99 to Dr. Alvy Bimler.

## 2020-05-31 NOTE — Patient Instructions (Signed)
[facility] Discharge Instructions for Patients Receiving Chemotherapy  Today you received the following chemotherapy agents: Velcade and Darzalex Faspro  To help prevent nausea and vomiting after your treatment, we encourage you to take your nausea medication  as prescribed.    If you develop nausea and vomiting that is not controlled by your nausea medication, call the clinic.   BELOW ARE SYMPTOMS THAT SHOULD BE REPORTED IMMEDIATELY:  *FEVER GREATER THAN 100.5 F  *CHILLS WITH OR WITHOUT FEVER  NAUSEA AND VOMITING THAT IS NOT CONTROLLED WITH YOUR NAUSEA MEDICATION  *UNUSUAL SHORTNESS OF BREATH  *UNUSUAL BRUISING OR BLEEDING  TENDERNESS IN MOUTH AND THROAT WITH OR WITHOUT PRESENCE OF ULCERS  *URINARY PROBLEMS  *BOWEL PROBLEMS  UNUSUAL RASH Items with * indicate a potential emergency and should be followed up as soon as possible.  Feel free to call the clinic should you have any questions or concerns. The clinic phone number is [phone].  Please show the CHEMO ALERT CARD at check-in to the Emergency Department and triage nurse.   

## 2020-05-31 NOTE — Progress Notes (Signed)
[place] OFFICE PROGRESS NOTE  Patient Care Team: Lucianne Lei, MD as PCP - General (Family Medicine) Thompson Grayer, MD as PCP - Electrophysiology (Cardiology) Josue Hector, MD as PCP - Cardiology (Cardiology)  ASSESSMENT & PLAN:  Multiple myeloma not having achieved remission ([place]) I have reviewed blood work with the patient and her son She have very good response with the addition of Revlimid recently She has virtually no side effects from therapy We will proceed with Velcade, daratumumab injection every 3 weeks and she will continue to take lenalidomide 3 times a week after dialysis I recommend dexamethasone taper to 2 mg twice a week When I see her back next month, I will get her treatment to once a week and eventual taper off   Pancytopenia, acquired ([place]) She has multifactorial pancytopenia, likely worsened with recent addition of Revlimid She does not need transfusion support We will monitor closely  ESRD (end stage renal disease) on dialysis [facility]) We discussed the limitations of treatment options due to her end-stage renal disease She will continue hemodialysis  Cancer associated pain She will continue taking pain medicine as needed   Other constipation She has severe, recurrent constipation We have extensive discussions today about laxative therapy   No orders of the defined types were placed in this encounter.   All questions were answered. The patient knows to call the clinic with any problems, questions or concerns. The total time spent in the appointment was 25 minutes encounter with patients including review of chart and various tests results, discussions about plan of care and coordination of care plan   Heath Lark, MD 05/31/2020 11:17 AM  INTERVAL HISTORY: Please see below for problem oriented charting. She returns with her son for further follow-up She missed an appointment recently She continues to have intermittent severe  constipation resolved with laxative Hemodialysis is going on well Her chronic pain is stable No recent infection, fever or chills  SUMMARY OF ONCOLOGIC HISTORY: Oncology History  Multiple myeloma not having achieved remission ([place])  03/03/2019 Initial Diagnosis   Multiple myeloma not having achieved remission ([place])   03/03/2019 Imaging   1. Round and oval lucent/lytic lesions in the skull, bilateral humeri, distal left femur, and possibly also in the pelvis are compatible with multiple myeloma. No right lower extremity lytic lesion identified. No pathologic fracture. 2. Advanced degenerative changes in the spine with no vertebral myeloma evident by plain radiography. 3. Advanced degenerative changes in the right upper extremity. Bilateral knee arthroplasty. 4.  Aortic Atherosclerosis (ICD10-I70.0).   03/04/2019 -  Chemotherapy   The patient had bortezomib for chemotherapy treatment.    03/04/2019 Bone Marrow Biopsy   Bone Marrow, Aspirate,Biopsy, and Clot - HYPERCELLULAR BONE MARROW FOR AGE WITH PLASMACYTOSIS. - SEE COMMENT. PERIPHERAL BLOOD: - NORMOCYTIC-NORMOCHROMIC ANEMIA. Diagnosis Note The bone marrow is hypercellular for age with increased number of atypical plasma cells averaging 30% of all cells in the aspirate associated with interstitial infiltrates and numerous variably sized aggregates in the clot and biopsy sections. 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 Confirmatory in situ hybridization for kappa and lambda as well as CD138 will be performed and the results reported in an addendum. The background shows trilineage hematopoiesis with mild non-specific changes primarily involving the erythroid series. Correlation with cytogenetic and FISH studies is recommended.    06/20/2019 -  Chemotherapy   The patient had dexamethasone (DECADRON) tablet 20 mg, 20 mg (100 % of original dose 20 mg), Oral,  Once, 14 of 16 cycles Dose modification: 20 mg (original dose 20 mg,  Cycle 1), 8 mg (original dose 8 mg, Cycle 10) Administration: 20 mg (06/20/2019), 20 mg (06/27/2019), 20 mg (07/04/2019), 20 mg (07/11/2019), 20 mg (07/18/2019), 20 mg (07/25/2019), 20 mg (08/08/2019), 20 mg (08/15/2019), 20 mg (09/05/2019), 20 mg (12/01/2019), 20 mg (09/26/2019), 20 mg (10/20/2019), 20 mg (11/10/2019), 20 mg (12/22/2019), 20 mg (01/12/2020), 8 mg (02/02/2020), 8 mg (02/23/2020), 8 mg (03/22/2020), 8 mg (04/12/2020), 8 mg (05/03/2020) daratumumab-hyaluronidase-fihj (DARZALEX FASPRO) 1800-30000 MG-UT/15ML chemo SQ injection 1,800 mg, 1,800 mg, Subcutaneous,  Once, 14 of 16 cycles Administration: 1,800 mg (06/20/2019), 1,800 mg (06/27/2019), 1,800 mg (07/04/2019), 1,800 mg (07/11/2019), 1,800 mg (07/18/2019), 1,800 mg (07/25/2019), 1,800 mg (08/08/2019), 1,800 mg (08/15/2019), 1,800 mg (09/05/2019), 1,800 mg (12/01/2019), 1,800 mg (02/02/2020), 1,800 mg (09/26/2019), 1,800 mg (10/20/2019), 1,800 mg (11/10/2019), 1,800 mg (12/22/2019), 1,800 mg (01/12/2020), 1,800 mg (02/23/2020), 1,800 mg (03/22/2020), 1,800 mg (04/12/2020), 1,800 mg (05/03/2020)  for chemotherapy treatment.      REVIEW OF SYSTEMS:   Constitutional: Denies fevers, chills or abnormal weight loss Eyes: Denies blurriness of vision Ears, nose, mouth, throat, and face: Denies mucositis or sore throat Respiratory: Denies cough, dyspnea or wheezes Cardiovascular: Denies palpitation, chest discomfort or lower extremity swelling Skin: Denies abnormal skin rashes Lymphatics: Denies new lymphadenopathy or easy bruising Neurological:Denies numbness, tingling or new weaknesses Behavioral/Psych: Mood is stable, no new changes  All other systems were reviewed with the patient and are negative.  I have reviewed the past medical history, past surgical history, social history and family history with the patient and they are unchanged from previous note.  ALLERGIES:  is allergic to aspirin, hydrocodone-acetaminophen, morphine, penicillins, betaine, dacarbazine, ketorolac  tromethamine, brimonidine, diltiazem hcl, hydrocodone-acetaminophen, neurontin [gabapentin], and sulfacetamide sodium.  MEDICATIONS:  Current Outpatient Medications  Medication Sig Dispense Refill  . acetaminophen (TYLENOL) 500 MG tablet Take 1,000 mg by mouth 2 (two) times daily as needed for moderate pain.     Marland Kitchen acyclovir (ZOVIRAX) 400 MG tablet Take 1 tablet (400 mg total) by mouth daily. 90 tablet 6  . allopurinol (ZYLOPRIM) 100 MG tablet Take 0.5 tablets (50 mg total) by mouth daily. 30 tablet 0  . aspirin EC 81 MG tablet Take 81 mg by mouth daily. Swallow whole.    . B Complex-C-Zn-Folic Acid (DIALYVITE 992-EQAS 15) 0.8 MG TABS Take 1 tablet by mouth every evening.     Marland Kitchen BIDIL 20-37.5 MG tablet     . cholecalciferol (VITAMIN D3) 25 MCG (1000 UT) tablet Take 1,000 Units by mouth daily.     Marland Kitchen dexamethasone (DECADRON) 2 MG tablet Take only 1 tab on Mondays and Fridays 30 tablet 0  . dorzolamide (TRUSOPT) 2 % ophthalmic solution Place 1 drop into the left eye 2 (two) times daily.   1  . fluorometholone (FML) 0.1 % ophthalmic suspension Place 1 drop into the right eye daily.     Marland Kitchen glimepiride (AMARYL) 1 MG tablet SMARTSIG:0.5 Pill By Mouth Daily    . heparin 1000 unit/mL SOLN injection Heparin Sodium (Porcine) 1,000 Units/mL Catheter Lock Arterial    . lactulose (CHRONULAC) 10 GM/15ML solution Take 15 mLs (10 g total) by mouth daily as needed for mild constipation. 450 mL 1  . lenalidomide (REVLIMID) 2.5 MG capsule Take 1 capsule three times a week after dialysis on Tuesday, Thursday and Saturdays 12 capsule 0  . LUMIGAN 0.01 % SOLN Place 1 drop into the left eye at bedtime.     Marland Kitchen  LYRICA 50 MG capsule Take 50 mg by mouth at bedtime.     . ondansetron (ZOFRAN) 4 MG tablet Take 1 tablet (4 mg total) by mouth every 8 (eight) hours as needed for nausea. 30 tablet 1  . pantoprazole (PROTONIX) 40 MG tablet Take 1 tablet (40 mg total) by mouth daily as needed (acid reflux/indigestion.). 30 tablet 11   . polyethylene glycol (MIRALAX / GLYCOLAX) 17 g packet Take 17 g by mouth daily.     . prochlorperazine (COMPAZINE) 5 MG tablet TAKE 1 TABLET (5 MG TOTAL) BY MOUTH EVERY 6 (SIX) HOURS AS NEEDED FOR NAUSEA OR VOMITING. 30 tablet 1  . Prochlorperazine Edisylate (COMPAZINE IJ) Take by mouth.    . senna-docusate (SENOKOT-S) 8.6-50 MG tablet Take 2 tablets by mouth 2 (two) times daily. 30 tablet 0  . sevelamer carbonate (RENVELA) 800 MG tablet Take 800-1,600 mg by mouth 3 (three) times daily with meals. Depending on meal size depends on the 1 or 2 tablets    . sodium chloride (OCEAN) 0.65 % SOLN nasal spray Place 1 spray into both nostrils 4 (four) times daily as needed for congestion.     . traMADol (ULTRAM) 50 MG tablet Take 1 tablet (50 mg total) by mouth every 8 (eight) hours as needed. 6 tablet 0   No current facility-administered medications for this visit.    PHYSICAL EXAMINATION: ECOG PERFORMANCE STATUS: 2 - Symptomatic, <50% confined to bed  Vitals:   05/31/20 1059  BP: (!) 121/50  Pulse: 71  Resp: 18  Temp: (!) 96.6 F (35.9 C)  SpO2: 100%   Filed Weights   05/31/20 1059  Weight: 125 lb 14.4 oz (57.1 kg)    GENERAL:alert, no distress and comfortable NEURO: alert & oriented x 3 with fluent speech, no focal motor/sensory deficits  LABORATORY DATA:  I have reviewed the data as listed    Component Value Date/Time   NA 138 05/03/2020 1137   K 4.3 05/03/2020 1137   CL 96 (L) 05/03/2020 1137   CO2 28 05/03/2020 1137   GLUCOSE 118 (H) 05/03/2020 1137   BUN 42 (H) 05/03/2020 1137   CREATININE 5.16 (HH) 05/03/2020 1137   CREATININE 2.71 (H) 10/20/2019 1128   CREATININE 1.03 (H) 05/11/2016 0931   CALCIUM 8.9 05/03/2020 1137   PROT 6.7 05/03/2020 1137   ALBUMIN 3.4 (L) 05/03/2020 1137   AST 22 05/03/2020 1137   AST 18 10/20/2019 1128   ALT 16 05/03/2020 1137   ALT 10 10/20/2019 1128   ALKPHOS 50 05/03/2020 1137   BILITOT 0.5 05/03/2020 1137   BILITOT 0.4 10/20/2019  1128   GFRNONAA 7 (L) 05/03/2020 1137   GFRNONAA 15 (L) 10/20/2019 1128   GFRAA 8 (L) 05/03/2020 1137   GFRAA 18 (L) 10/20/2019 1128    No results found for: SPEP, UPEP  Lab Results  Component Value Date   WBC 3.2 (L) 05/31/2020   NEUTROABS 1.5 (L) 05/31/2020   HGB 11.4 (L) 05/31/2020   HCT 35.5 (L) 05/31/2020   MCV 89.6 05/31/2020   PLT 162 05/31/2020      Chemistry      Component Value Date/Time   NA 138 05/03/2020 1137   K 4.3 05/03/2020 1137   CL 96 (L) 05/03/2020 1137   CO2 28 05/03/2020 1137   BUN 42 (H) 05/03/2020 1137   CREATININE 5.16 (HH) 05/03/2020 1137   CREATININE 2.71 (H) 10/20/2019 1128   CREATININE 1.03 (H) 05/11/2016 7494  Component Value Date/Time   CALCIUM 8.9 05/03/2020 1137   ALKPHOS 50 05/03/2020 1137   AST 22 05/03/2020 1137   AST 18 10/20/2019 1128   ALT 16 05/03/2020 1137   ALT 10 10/20/2019 1128   BILITOT 0.5 05/03/2020 1137   BILITOT 0.4 10/20/2019 1128

## 2020-06-01 LAB — KAPPA/LAMBDA LIGHT CHAINS
Kappa free light chain: 86.5 mg/L — ABNORMAL HIGH (ref 3.3–19.4)
Kappa, lambda light chain ratio: 3.33 — ABNORMAL HIGH (ref 0.26–1.65)
Lambda free light chains: 26 mg/L (ref 5.7–26.3)

## 2020-06-03 LAB — MULTIPLE MYELOMA PANEL, SERUM
Albumin SerPl Elph-Mcnc: 3.5 g/dL (ref 2.9–4.4)
Albumin/Glob SerPl: 1.5 (ref 0.7–1.7)
Alpha 1: 0.3 g/dL (ref 0.0–0.4)
Alpha2 Glob SerPl Elph-Mcnc: 0.6 g/dL (ref 0.4–1.0)
B-Globulin SerPl Elph-Mcnc: 1.4 g/dL — ABNORMAL HIGH (ref 0.7–1.3)
Gamma Glob SerPl Elph-Mcnc: 0.2 g/dL — ABNORMAL LOW (ref 0.4–1.8)
Globulin, Total: 2.5 g/dL (ref 2.2–3.9)
IgA: 25 mg/dL — ABNORMAL LOW (ref 64–422)
IgG (Immunoglobin G), Serum: 1133 mg/dL (ref 586–1602)
IgM (Immunoglobulin M), Srm: 11 mg/dL — ABNORMAL LOW (ref 26–217)
M Protein SerPl Elph-Mcnc: 0.9 g/dL — ABNORMAL HIGH
Total Protein ELP: 6 g/dL (ref 6.0–8.5)

## 2020-06-11 ENCOUNTER — Other Ambulatory Visit: Payer: Self-pay

## 2020-06-11 DIAGNOSIS — C9000 Multiple myeloma not having achieved remission: Secondary | ICD-10-CM

## 2020-06-11 MED ORDER — LENALIDOMIDE 2.5 MG PO CAPS: ORAL_CAPSULE | ORAL | 0 refills | Status: DC

## 2020-06-23 ENCOUNTER — Other Ambulatory Visit: Payer: Self-pay

## 2020-06-23 ENCOUNTER — Telehealth: Payer: Self-pay | Admitting: Hematology and Oncology

## 2020-06-23 ENCOUNTER — Inpatient Hospital Stay (HOSPITAL_BASED_OUTPATIENT_CLINIC_OR_DEPARTMENT_OTHER): Payer: Medicare HMO | Admitting: Hematology and Oncology

## 2020-06-23 ENCOUNTER — Inpatient Hospital Stay: Payer: Medicare HMO | Attending: Hematology and Oncology

## 2020-06-23 ENCOUNTER — Inpatient Hospital Stay: Payer: Medicare HMO

## 2020-06-23 ENCOUNTER — Other Ambulatory Visit: Payer: Self-pay | Admitting: *Deleted

## 2020-06-23 ENCOUNTER — Telehealth: Payer: Self-pay | Admitting: *Deleted

## 2020-06-23 ENCOUNTER — Ambulatory Visit: Payer: Medicare HMO | Admitting: Physician Assistant

## 2020-06-23 ENCOUNTER — Encounter: Payer: Self-pay | Admitting: Hematology and Oncology

## 2020-06-23 VITALS — BP 106/61 | HR 80 | Temp 98.2°F | Resp 20 | Ht 65.0 in | Wt 125.2 lb

## 2020-06-23 DIAGNOSIS — N186 End stage renal disease: Secondary | ICD-10-CM | POA: Insufficient documentation

## 2020-06-23 DIAGNOSIS — E538 Deficiency of other specified B group vitamins: Secondary | ICD-10-CM | POA: Insufficient documentation

## 2020-06-23 DIAGNOSIS — Z7982 Long term (current) use of aspirin: Secondary | ICD-10-CM | POA: Insufficient documentation

## 2020-06-23 DIAGNOSIS — Z5112 Encounter for antineoplastic immunotherapy: Secondary | ICD-10-CM | POA: Insufficient documentation

## 2020-06-23 DIAGNOSIS — M25552 Pain in left hip: Secondary | ICD-10-CM | POA: Diagnosis not present

## 2020-06-23 DIAGNOSIS — Z7189 Other specified counseling: Secondary | ICD-10-CM

## 2020-06-23 DIAGNOSIS — D61818 Other pancytopenia: Secondary | ICD-10-CM | POA: Insufficient documentation

## 2020-06-23 DIAGNOSIS — Z79899 Other long term (current) drug therapy: Secondary | ICD-10-CM | POA: Diagnosis not present

## 2020-06-23 DIAGNOSIS — C9000 Multiple myeloma not having achieved remission: Secondary | ICD-10-CM | POA: Diagnosis not present

## 2020-06-23 DIAGNOSIS — Z992 Dependence on renal dialysis: Secondary | ICD-10-CM

## 2020-06-23 DIAGNOSIS — I129 Hypertensive chronic kidney disease with stage 1 through stage 4 chronic kidney disease, or unspecified chronic kidney disease: Secondary | ICD-10-CM | POA: Diagnosis not present

## 2020-06-23 DIAGNOSIS — R110 Nausea: Secondary | ICD-10-CM

## 2020-06-23 DIAGNOSIS — T82898D Other specified complication of vascular prosthetic devices, implants and grafts, subsequent encounter: Secondary | ICD-10-CM | POA: Diagnosis not present

## 2020-06-23 LAB — COMPREHENSIVE METABOLIC PANEL
ALT: 14 U/L (ref 0–44)
AST: 19 U/L (ref 15–41)
Albumin: 3.2 g/dL — ABNORMAL LOW (ref 3.5–5.0)
Alkaline Phosphatase: 58 U/L (ref 38–126)
Anion gap: 7 (ref 5–15)
BUN: 21 mg/dL (ref 8–23)
CO2: 35 mmol/L — ABNORMAL HIGH (ref 22–32)
Calcium: 8.4 mg/dL — ABNORMAL LOW (ref 8.9–10.3)
Chloride: 97 mmol/L — ABNORMAL LOW (ref 98–111)
Creatinine, Ser: 3.24 mg/dL (ref 0.44–1.00)
GFR calc Af Amer: 14 mL/min — ABNORMAL LOW (ref >60)
GFR calc non Af Amer: 12 mL/min — ABNORMAL LOW (ref >60)
Glucose, Bld: 88 mg/dL (ref 70–99)
Potassium: 3.5 mmol/L (ref 3.5–5.1)
Sodium: 139 mmol/L (ref 135–145)
Total Bilirubin: 0.4 mg/dL (ref 0.3–1.2)
Total Protein: 6.1 g/dL — ABNORMAL LOW (ref 6.5–8.1)

## 2020-06-23 LAB — CBC WITH DIFFERENTIAL/PLATELET
Abs Immature Granulocytes: 0 10*3/uL (ref 0.00–0.07)
Basophils Absolute: 0 10*3/uL (ref 0.0–0.1)
Basophils Relative: 1 %
Eosinophils Absolute: 0.3 10*3/uL (ref 0.0–0.5)
Eosinophils Relative: 6 %
HCT: 31.8 % — ABNORMAL LOW (ref 36.0–46.0)
Hemoglobin: 10.3 g/dL — ABNORMAL LOW (ref 12.0–15.0)
Immature Granulocytes: 0 %
Lymphocytes Relative: 29 %
Lymphs Abs: 1.1 10*3/uL (ref 0.7–4.0)
MCH: 28.9 pg (ref 26.0–34.0)
MCHC: 32.4 g/dL (ref 30.0–36.0)
MCV: 89.3 fL (ref 80.0–100.0)
Monocytes Absolute: 0.6 10*3/uL (ref 0.1–1.0)
Monocytes Relative: 15 %
Neutro Abs: 1.9 10*3/uL (ref 1.7–7.7)
Neutrophils Relative %: 49 %
Platelets: 133 10*3/uL — ABNORMAL LOW (ref 150–400)
RBC: 3.56 MIL/uL — ABNORMAL LOW (ref 3.87–5.11)
RDW: 15 % (ref 11.5–15.5)
WBC: 3.9 10*3/uL — ABNORMAL LOW (ref 4.0–10.5)
nRBC: 0 % (ref 0.0–0.2)

## 2020-06-23 MED ORDER — ACETAMINOPHEN 325 MG PO TABS
ORAL_TABLET | ORAL | Status: AC
  Filled 2020-06-23: qty 2

## 2020-06-23 MED ORDER — SODIUM CHLORIDE 0.9 % IV SOLN: 250.0000 mL | INTRAVENOUS | Status: DC | PRN

## 2020-06-23 MED ORDER — BORTEZOMIB CHEMO SQ INJECTION 3.5 MG (2.5MG/ML)
0.7800 mg/m2 | Freq: Once | INTRAMUSCULAR | Status: AC
  Administered 2020-06-23: 1.25 mg via SUBCUTANEOUS
  Filled 2020-06-23: qty 0.5

## 2020-06-23 MED ORDER — PROCHLORPERAZINE MALEATE 10 MG PO TABS
ORAL_TABLET | ORAL | Status: AC
  Filled 2020-06-23: qty 1

## 2020-06-23 MED ORDER — ACETAMINOPHEN 325 MG PO TABS
650.0000 mg | ORAL_TABLET | Freq: Once | ORAL | Status: AC
  Administered 2020-06-23: 650 mg via ORAL

## 2020-06-23 MED ORDER — DIPHENHYDRAMINE HCL 25 MG PO CAPS
25.0000 mg | ORAL_CAPSULE | Freq: Once | ORAL | Status: AC
  Administered 2020-06-23: 25 mg via ORAL

## 2020-06-23 MED ORDER — CYANOCOBALAMIN 1000 MCG/ML IJ SOLN
1000.0000 ug | Freq: Once | INTRAMUSCULAR | Status: AC
  Administered 2020-06-23: 1000 ug via INTRAMUSCULAR

## 2020-06-23 MED ORDER — CYANOCOBALAMIN 1000 MCG/ML IJ SOLN
INTRAMUSCULAR | Status: AC
  Filled 2020-06-23: qty 1

## 2020-06-23 MED ORDER — DEXAMETHASONE 4 MG PO TABS
8.0000 mg | ORAL_TABLET | Freq: Once | ORAL | Status: AC
  Administered 2020-06-23: 8 mg via ORAL

## 2020-06-23 MED ORDER — LENALIDOMIDE 2.5 MG PO CAPS: ORAL_CAPSULE | ORAL | 0 refills | Status: DC

## 2020-06-23 MED ORDER — DIPHENHYDRAMINE HCL 25 MG PO CAPS
ORAL_CAPSULE | ORAL | Status: AC
  Filled 2020-06-23: qty 1

## 2020-06-23 MED ORDER — DARATUMUMAB-HYALURONIDASE-FIHJ 1800-30000 MG-UT/15ML ~~LOC~~ SOLN
1800.0000 mg | Freq: Once | SUBCUTANEOUS | Status: AC
  Administered 2020-06-23: 1800 mg via SUBCUTANEOUS
  Filled 2020-06-23: qty 15

## 2020-06-23 MED ORDER — DEXAMETHASONE 4 MG PO TABS
ORAL_TABLET | ORAL | Status: AC
  Filled 2020-06-23: qty 2

## 2020-06-23 MED ORDER — TRAMADOL HCL 50 MG PO TABS
50.0000 mg | ORAL_TABLET | Freq: Three times a day (TID) | ORAL | 0 refills | Status: DC | PRN
Start: 2020-06-23 — End: 2020-08-04

## 2020-06-23 MED ORDER — PROCHLORPERAZINE MALEATE 10 MG PO TABS
10.0000 mg | ORAL_TABLET | Freq: Once | ORAL | Status: AC
  Administered 2020-06-23: 10 mg via ORAL

## 2020-06-23 NOTE — Assessment & Plan Note (Signed)
She has multifactorial pancytopenia, likely worsened with recent addition of Revlimid She does not need transfusion support We will monitor closely

## 2020-06-23 NOTE — Patient Instructions (Signed)
[facility] Discharge Instructions for Patients Receiving Chemotherapy  Today you received the following chemotherapy agents: Velcade and Darzalex Faspro  To help prevent nausea and vomiting after your treatment, we encourage you to take your nausea medication  as prescribed.    If you develop nausea and vomiting that is not controlled by your nausea medication, call the clinic.   BELOW ARE SYMPTOMS THAT SHOULD BE REPORTED IMMEDIATELY:  *FEVER GREATER THAN 100.5 F  *CHILLS WITH OR WITHOUT FEVER  NAUSEA AND VOMITING THAT IS NOT CONTROLLED WITH YOUR NAUSEA MEDICATION  *UNUSUAL SHORTNESS OF BREATH  *UNUSUAL BRUISING OR BLEEDING  TENDERNESS IN MOUTH AND THROAT WITH OR WITHOUT PRESENCE OF ULCERS  *URINARY PROBLEMS  *BOWEL PROBLEMS  UNUSUAL RASH Items with * indicate a potential emergency and should be followed up as soon as possible.  Feel free to call the clinic should you have any questions or concerns. The clinic phone number is [phone].  Please show the CHEMO ALERT CARD at check-in to the Emergency Department and triage nurse.   

## 2020-06-23 NOTE — Progress Notes (Signed)
[facility] OFFICE PROGRESS NOTE  Patient Care Team: Renaye Rakers, MD as PCP - General (Family Medicine) Hillis Range, MD as PCP - Electrophysiology (Cardiology) Wendall Stade, MD as PCP - Cardiology (Cardiology)  ASSESSMENT & PLAN:  Multiple myeloma not having achieved remission ([facility]) I have reviewed blood work with the patient She have very good response with the addition of Revlimid recently She has virtually no side effects from therapy We will proceed with Velcade, daratumumab injection every 3 weeks and she will continue to take lenalidomide 3 times a week after dialysis I recommend dexamethasone taper By the end of the month, I will get her treatment to once a week and eventual taper off   Pancytopenia, acquired ([facility]) She has multifactorial pancytopenia, likely worsened with recent addition of Revlimid She does not need transfusion support We will monitor closely  ESRD (end stage renal disease) on dialysis [facility]) We discussed the limitations of treatment options due to her end-stage renal disease She will continue hemodialysis  Left hip pain She has chronic left hip pain I recommend conservative approach There is no contraindication for her to get injection to her left hip joint if needed I refilled the prescription of tramadol for the patient I warned her about risk of constipation   No orders of the defined types were placed in this encounter.   All questions were answered. The patient knows to call the clinic with any problems, questions or concerns. The total time spent in the appointment was 20 minutes encounter with patients including review of chart and various tests results, discussions about plan of care and coordination of care plan   Artis Delay, MD 06/23/2020 1:02 PM  INTERVAL HISTORY: Please see below for problem oriented charting. She returns for treatment and follow-up She is compliant taking medications as directed She saw her  orthopedic surgeon for hip pain She is taking tramadol now No recent infection, fever or chills Denies recent bleeding  SUMMARY OF ONCOLOGIC HISTORY: Oncology History  Multiple myeloma not having achieved remission ([facility])  03/03/2019 Initial Diagnosis   Multiple myeloma not having achieved remission ([facility])   03/03/2019 Imaging   1. Round and oval lucent/lytic lesions in the skull, bilateral humeri, distal left femur, and possibly also in the pelvis are compatible with multiple myeloma. No right lower extremity lytic lesion identified. No pathologic fracture. 2. Advanced degenerative changes in the spine with no vertebral myeloma evident by plain radiography. 3. Advanced degenerative changes in the right upper extremity. Bilateral knee arthroplasty. 4.  Aortic Atherosclerosis (ICD10-I70.0).   03/04/2019 -  Chemotherapy   The patient had bortezomib for chemotherapy treatment.    03/04/2019 Bone Marrow Biopsy   Bone Marrow, Aspirate,Biopsy, and Clot - HYPERCELLULAR BONE MARROW FOR AGE WITH PLASMACYTOSIS. - SEE COMMENT. PERIPHERAL BLOOD: - NORMOCYTIC-NORMOCHROMIC ANEMIA. Diagnosis Note The bone marrow is hypercellular for age with increased number of atypical plasma cells averaging 30% of all cells in the aspirate associated with interstitial infiltrates and numerous variably sized aggregates in the clot and biopsy sections. 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 Confirmatory in situ hybridization for kappa and lambda as well as CD138 will be performed and the results reported in an addendum. The background shows trilineage hematopoiesis with mild non-specific changes primarily involving the erythroid series. Correlation with cytogenetic and FISH studies is recommended.    06/20/2019 -  Chemotherapy   The patient had dexamethasone (DECADRON) tablet 20 mg, 20 mg (100 % of original dose 20  mg), Oral,  Once, 16 of 17 cycles Dose modification: 20 mg (original dose 20 mg, Cycle 1),  8 mg (original dose 8 mg, Cycle 10) Administration: 20 mg (06/20/2019), 20 mg (06/27/2019), 20 mg (07/04/2019), 20 mg (07/11/2019), 20 mg (07/18/2019), 20 mg (07/25/2019), 20 mg (08/08/2019), 20 mg (08/15/2019), 20 mg (09/05/2019), 20 mg (12/01/2019), 20 mg (09/26/2019), 20 mg (10/20/2019), 20 mg (11/10/2019), 20 mg (12/22/2019), 20 mg (01/12/2020), 8 mg (02/02/2020), 8 mg (02/23/2020), 8 mg (03/22/2020), 8 mg (04/12/2020), 8 mg (05/03/2020), 8 mg (05/31/2020) daratumumab-hyaluronidase-fihj (DARZALEX FASPRO) 1800-30000 MG-UT/15ML chemo SQ injection 1,800 mg, 1,800 mg, Subcutaneous,  Once, 16 of 17 cycles Administration: 1,800 mg (06/20/2019), 1,800 mg (06/27/2019), 1,800 mg (07/04/2019), 1,800 mg (07/11/2019), 1,800 mg (07/18/2019), 1,800 mg (07/25/2019), 1,800 mg (08/08/2019), 1,800 mg (08/15/2019), 1,800 mg (09/05/2019), 1,800 mg (12/01/2019), 1,800 mg (02/02/2020), 1,800 mg (09/26/2019), 1,800 mg (10/20/2019), 1,800 mg (11/10/2019), 1,800 mg (12/22/2019), 1,800 mg (01/12/2020), 1,800 mg (02/23/2020), 1,800 mg (03/22/2020), 1,800 mg (04/12/2020), 1,800 mg (05/03/2020), 1,800 mg (05/31/2020)  for chemotherapy treatment.      REVIEW OF SYSTEMS:   Constitutional: Denies fevers, chills or abnormal weight loss Eyes: Denies blurriness of vision Ears, nose, mouth, throat, and face: Denies mucositis or sore throat Respiratory: Denies cough, dyspnea or wheezes Cardiovascular: Denies palpitation, chest discomfort or lower extremity swelling Gastrointestinal:  Denies nausea, heartburn or change in bowel habits Skin: Denies abnormal skin rashes Lymphatics: Denies new lymphadenopathy or easy bruising Neurological:Denies numbness, tingling or new weaknesses Behavioral/Psych: Mood is stable, no new changes  All other systems were reviewed with the patient and are negative.  I have reviewed the past medical history, past surgical history, social history and family history with the patient and they are unchanged from previous note.  ALLERGIES:  is  allergic to aspirin, hydrocodone-acetaminophen, morphine, penicillins, betaine, dacarbazine, ketorolac tromethamine, brimonidine, diltiazem hcl, hydrocodone-acetaminophen, neurontin [gabapentin], and sulfacetamide sodium.  MEDICATIONS:  Current Outpatient Medications  Medication Sig Dispense Refill  . acetaminophen (TYLENOL) 500 MG tablet Take 1,000 mg by mouth 2 (two) times daily as needed for moderate pain.     Marland Kitchen acyclovir (ZOVIRAX) 400 MG tablet Take 1 tablet (400 mg total) by mouth daily. 90 tablet 6  . allopurinol (ZYLOPRIM) 100 MG tablet Take 0.5 tablets (50 mg total) by mouth daily. 30 tablet 0  . aspirin EC 81 MG tablet Take 81 mg by mouth daily. Swallow whole.    . B Complex-C-Zn-Folic Acid (DIALYVITE 944-HQPR 15) 0.8 MG TABS Take 1 tablet by mouth every evening.     Marland Kitchen BIDIL 20-37.5 MG tablet     . cholecalciferol (VITAMIN D3) 25 MCG (1000 UT) tablet Take 1,000 Units by mouth daily.     Marland Kitchen dexamethasone (DECADRON) 2 MG tablet Take only 1 tab on Mondays and Fridays 30 tablet 0  . dorzolamide (TRUSOPT) 2 % ophthalmic solution Place 1 drop into the left eye 2 (two) times daily.   1  . fluorometholone (FML) 0.1 % ophthalmic suspension Place 1 drop into the right eye daily.     Marland Kitchen glimepiride (AMARYL) 1 MG tablet SMARTSIG:0.5 Pill By Mouth Daily    . heparin 1000 unit/mL SOLN injection Heparin Sodium (Porcine) 1,000 Units/mL Catheter Lock Arterial    . lenalidomide (REVLIMID) 2.5 MG capsule Take 1 capsule three times a week after dialysis on Tuesday, Thursday and Saturdays 12 capsule 0  . lidocaine-prilocaine (EMLA) cream SMARTSIG:Sparingly Topical    . LUMIGAN 0.01 % SOLN Place 1 drop into the left eye at  bedtime.     Marland Kitchen LYRICA 50 MG capsule Take 50 mg by mouth at bedtime.     . midodrine (PROAMATINE) 10 MG tablet TAKE 1 TAB AS DIRECTED TAKE ONE 30 MIN PRIOR TO TX AND THEN TAKE ONE MID TREATMENT IF NEEDED.    Marland Kitchen ondansetron (ZOFRAN) 4 MG tablet Take 1 tablet (4 mg total) by mouth every 8  (eight) hours as needed for nausea. 30 tablet 1  . pantoprazole (PROTONIX) 40 MG tablet Take 1 tablet (40 mg total) by mouth daily as needed (acid reflux/indigestion.). 30 tablet 11  . polyethylene glycol (MIRALAX / GLYCOLAX) 17 g packet Take 17 g by mouth daily.     . prochlorperazine (COMPAZINE) 5 MG tablet TAKE 1 TABLET (5 MG TOTAL) BY MOUTH EVERY 6 (SIX) HOURS AS NEEDED FOR NAUSEA OR VOMITING. 30 tablet 1  . Prochlorperazine Edisylate (COMPAZINE IJ) Take by mouth.    . senna-docusate (SENOKOT-S) 8.6-50 MG tablet Take 2 tablets by mouth 2 (two) times daily. 30 tablet 0  . sevelamer carbonate (RENVELA) 800 MG tablet Take 800-1,600 mg by mouth 3 (three) times daily with meals. Depending on meal size depends on the 1 or 2 tablets    . sodium chloride (OCEAN) 0.65 % SOLN nasal spray Place 1 spray into both nostrils 4 (four) times daily as needed for congestion.     . traMADol (ULTRAM) 50 MG tablet Take 1 tablet (50 mg total) by mouth every 8 (eight) hours as needed. 60 tablet 0   Current Facility-Administered Medications  Medication Dose Route Frequency Provider Last Rate Last Admin  . 0.9 %  sodium chloride infusion  250 mL Intravenous PRN Waynetta Sandy, MD       Facility-Administered Medications Ordered in Other Visits  Medication Dose Route Frequency Provider Last Rate Last Admin  . bortezomib SQ (VELCADE) chemo injection 1.25 mg  0.78 mg/m2 (Treatment Plan Recorded) Subcutaneous Once Alvy Bimler, Kaley Jutras, MD      . cyanocobalamin ((VITAMIN B-12)) injection 1,000 mcg  1,000 mcg Intramuscular Once Alvy Bimler, Zacharee Gaddie, MD      . daratumumab-hyaluronidase-fihj (DARZALEX FASPRO) 1800-30000 MG-UT/15ML chemo SQ injection 1,800 mg  1,800 mg Subcutaneous Once Alvy Bimler, Kehlani Vancamp, MD        PHYSICAL EXAMINATION: ECOG PERFORMANCE STATUS: 1 - Symptomatic but completely ambulatory  Vitals:   06/23/20 1107  BP: (!) 117/46  Pulse: 73  Resp: 18  Temp: 98 F (36.7 C)  SpO2: 100%   Filed Weights   06/23/20  1107  Weight: 126 lb (57.2 kg)    GENERAL:alert, no distress and comfortable NEURO: alert & oriented x 3 with fluent speech, no focal motor/sensory deficits  LABORATORY DATA:  I have reviewed the data as listed    Component Value Date/Time   NA 139 06/23/2020 1031   K 3.5 06/23/2020 1031   CL 97 (L) 06/23/2020 1031   CO2 35 (H) 06/23/2020 1031   GLUCOSE 88 06/23/2020 1031   BUN 21 06/23/2020 1031   CREATININE 3.24 (HH) 06/23/2020 1031   CREATININE 2.71 (H) 10/20/2019 1128   CREATININE 1.03 (H) 05/11/2016 0931   CALCIUM 8.4 (L) 06/23/2020 1031   PROT 6.1 (L) 06/23/2020 1031   ALBUMIN 3.2 (L) 06/23/2020 1031   AST 19 06/23/2020 1031   AST 18 10/20/2019 1128   ALT 14 06/23/2020 1031   ALT 10 10/20/2019 1128   ALKPHOS 58 06/23/2020 1031   BILITOT 0.4 06/23/2020 1031   BILITOT 0.4 10/20/2019 1128   GFRNONAA 12 (L)  06/23/2020 1031   GFRNONAA 15 (L) 10/20/2019 1128   GFRAA 14 (L) 06/23/2020 1031   GFRAA 18 (L) 10/20/2019 1128    No results found for: SPEP, UPEP  Lab Results  Component Value Date   WBC 3.9 (L) 06/23/2020   NEUTROABS 1.9 06/23/2020   HGB 10.3 (L) 06/23/2020   HCT 31.8 (L) 06/23/2020   MCV 89.3 06/23/2020   PLT 133 (L) 06/23/2020      Chemistry      Component Value Date/Time   NA 139 06/23/2020 1031   K 3.5 06/23/2020 1031   CL 97 (L) 06/23/2020 1031   CO2 35 (H) 06/23/2020 1031   BUN 21 06/23/2020 1031   CREATININE 3.24 (HH) 06/23/2020 1031   CREATININE 2.71 (H) 10/20/2019 1128   CREATININE 1.03 (H) 05/11/2016 0931      Component Value Date/Time   CALCIUM 8.4 (L) 06/23/2020 1031   ALKPHOS 58 06/23/2020 1031   AST 19 06/23/2020 1031   AST 18 10/20/2019 1128   ALT 14 06/23/2020 1031   ALT 10 10/20/2019 1128   BILITOT 0.4 06/23/2020 1031   BILITOT 0.4 10/20/2019 1128

## 2020-06-23 NOTE — Telephone Encounter (Signed)
Per Dr.Gorsuch, OK to proceed with treatment with high creatinine. Pt is on dialysis

## 2020-06-23 NOTE — Assessment & Plan Note (Signed)
We discussed the limitations of treatment options due to her end-stage renal disease °She will continue hemodialysis °

## 2020-06-23 NOTE — Assessment & Plan Note (Signed)
She has chronic left hip pain I recommend conservative approach There is no contraindication for her to get injection to her left hip joint if needed I refilled the prescription of tramadol for the patient I warned her about risk of constipation

## 2020-06-23 NOTE — Assessment & Plan Note (Signed)
I have reviewed blood work with the patient [name] have very good response with the addition of Revlimid recently [name] has virtually no side effects from therapy We will proceed with Velcade, daratumumab injection every 3 weeks and [name] will continue to take lenalidomide 3 times a week after dialysis I recommend dexamethasone taper By the end of the month, I will get her treatment to once a week and eventual taper off

## 2020-06-23 NOTE — Progress Notes (Signed)
POST OPERATIVE DIALYSIS ACCESS OFFICE NOTE    CC:  F/u for dialysis access surgery  HPI:  This is a 86 y.o. female who is s/p placement of right innominate vein TDC and revision of right arm AV fistula with conversion to brachial artery to cephalic vein fistula on 5/85/27 by Dr. Donzetta Matters.  She presents today with persistent swelling of her right arm for approximately 2 weeks.  Her dialysis clinic has been using her right tunneled dialysis catheter for treatment.  Her son accompanies her today.  She says there is been no reports of treatment issues.  She denies fever or chills.  She had one episode of right forearm pain after dialysis treatment that was relieved with elevation and ice pack.  She has chronic numbness of both hands.  She currently denies hand pain.  Dialysis days:  TTS   Dialysis center:  [facility] ([place])  Allergies  Allergen Reactions  . Aspirin Other (See Comments)    REACTION: STOMACH ISSUES WITH DOSE HIGHER THAN 81 MG  Other reaction(s): GI Upset (intolerance), Other (See Comments) REACTION: STOMACH ISSUES WITH DOSE HIGHER THAN 81 MG  REACTION: STOMACH ISSUES WITH DOSE HIGHER THAN 81 MG   . Hydrocodone-Acetaminophen Nausea And Vomiting  . Morphine Nausea And Vomiting  . Penicillins Rash    Patient took injection and tablets, had a reaction. She has taken amoxicillin with no reaction Has patient had a PCN reaction causing immediate rash, facial/tongue/throat swelling, SOB or lightheadedness with hypotension: Yes Has patient had a PCN reaction causing severe rash involving mucus membranes or skin necrosis: Yes Has patient had a PCN reaction that required hospitalization No Has patient had a PCN reaction occurring within the last 10 years: No If all of the above answers are "NO", then m Patient took injection and tablets, had a reaction. She has taken amoxicillin with no reaction Patient took injection and tablets, had a reaction. She has taken amoxicillin with no  reaction Has patient had a PCN reaction causing immediate rash, facial/tongue/throat swelling, SOB or lightheadedness with hypotension: Yes Has patient had a PCN reaction causing severe rash involving mucus membranes or skin necrosis: Yes Has patient had a PCN reaction that required hospitalization No Has patient had a PCN reaction occurring within the last 10 years: No If all of the above answers are "NO", then m  . Betaine Nausea And Vomiting  . Dacarbazine Nausea And Vomiting  . Ketorolac Tromethamine Itching  . Brimonidine Itching  . Diltiazem Hcl Rash  . Hydrocodone-Acetaminophen Nausea And Vomiting  . Neurontin [Gabapentin] Nausea And Vomiting  . Sulfacetamide Sodium Rash    Current Outpatient Medications  Medication Sig Dispense Refill  . acetaminophen (TYLENOL) 500 MG tablet Take 1,000 mg by mouth 2 (two) times daily as needed for moderate pain.     Marland Kitchen acyclovir (ZOVIRAX) 400 MG tablet Take 1 tablet (400 mg total) by mouth daily. 90 tablet 6  . allopurinol (ZYLOPRIM) 100 MG tablet Take 0.5 tablets (50 mg total) by mouth daily. 30 tablet 0  . aspirin EC 81 MG tablet Take 81 mg by mouth daily. Swallow whole.    . B Complex-C-Zn-Folic Acid (DIALYVITE 782-UMPN 15) 0.8 MG TABS Take 1 tablet by mouth every evening.     Marland Kitchen BIDIL 20-37.5 MG tablet     . cholecalciferol (VITAMIN D3) 25 MCG (1000 UT) tablet Take 1,000 Units by mouth daily.     Marland Kitchen dexamethasone (DECADRON) 2 MG tablet Take only 1 tab on Mondays  and Fridays 30 tablet 0  . dorzolamide (TRUSOPT) 2 % ophthalmic solution Place 1 drop into the left eye 2 (two) times daily.   1  . fluorometholone (FML) 0.1 % ophthalmic suspension Place 1 drop into the right eye daily.     Marland Kitchen glimepiride (AMARYL) 1 MG tablet SMARTSIG:0.5 Pill By Mouth Daily    . heparin 1000 unit/mL SOLN injection Heparin Sodium (Porcine) 1,000 Units/mL Catheter Lock Arterial    . lenalidomide (REVLIMID) 2.5 MG capsule Take 1 capsule three times a week after dialysis  on Tuesday, Thursday and Saturdays 12 capsule 0  . lidocaine-prilocaine (EMLA) cream SMARTSIG:Sparingly Topical    . LUMIGAN 0.01 % SOLN Place 1 drop into the left eye at bedtime.     Marland Kitchen LYRICA 50 MG capsule Take 50 mg by mouth at bedtime.     . midodrine (PROAMATINE) 10 MG tablet TAKE 1 TAB AS DIRECTED TAKE ONE 30 MIN PRIOR TO TX AND THEN TAKE ONE MID TREATMENT IF NEEDED.    Marland Kitchen ondansetron (ZOFRAN) 4 MG tablet Take 1 tablet (4 mg total) by mouth every 8 (eight) hours as needed for nausea. 30 tablet 1  . pantoprazole (PROTONIX) 40 MG tablet Take 1 tablet (40 mg total) by mouth daily as needed (acid reflux/indigestion.). 30 tablet 11  . polyethylene glycol (MIRALAX / GLYCOLAX) 17 g packet Take 17 g by mouth daily.     . prochlorperazine (COMPAZINE) 5 MG tablet TAKE 1 TABLET (5 MG TOTAL) BY MOUTH EVERY 6 (SIX) HOURS AS NEEDED FOR NAUSEA OR VOMITING. 30 tablet 1  . Prochlorperazine Edisylate (COMPAZINE IJ) Take by mouth.    . senna-docusate (SENOKOT-S) 8.6-50 MG tablet Take 2 tablets by mouth 2 (two) times daily. 30 tablet 0  . sevelamer carbonate (RENVELA) 800 MG tablet Take 800-1,600 mg by mouth 3 (three) times daily with meals. Depending on meal size depends on the 1 or 2 tablets    . sodium chloride (OCEAN) 0.65 % SOLN nasal spray Place 1 spray into both nostrils 4 (four) times daily as needed for congestion.     . traMADol (ULTRAM) 50 MG tablet Take 1 tablet (50 mg total) by mouth every 8 (eight) hours as needed. 6 tablet 0   No current facility-administered medications for this visit.     ROS:  See HPI  There were no vitals taken for this visit.   Physical Exam:  General appearance: Well-developed, well-nourished female in no apparent distress.  She ambulates via rolling walker Cardiac: Heart rate and rhythm are regular Respiratory: Lungs are clear to auscultation bilaterally Extremities: There is mild edema of the right arm extending from her shoulder to her wrist.  Her incisions are  well-healed.  Her fistula is well developed with palpable thrill.  Her bruit is mildly pulsatile.  She has no erythema or tenderness to palpation.  Grip strength is 5/5.  Assessment/Plan:   86 y.o. year old female who is s/p placement of right innominate vein TDC and revision of right arm AV fistula with conversion to brachial artery to cephalic vein fistula on 5/85/27 with persistent edema of her right upper extremity for approximately 2 weeks.  We discussed the possibility of central venous stenosis and/or malpositioning of her catheter.  Her dialysis treatment center has been using her catheter since noticing the swelling and she reports no flow issues or clearance issues.  We will plan for fistulogram on nondialysis day.  The patient and her son are in agreement with this plan.  Barbie Banner, PA-C 06/23/2020 9:05 AM [facility] [phone]  Clinic MD:  Scot Dock

## 2020-06-23 NOTE — Telephone Encounter (Signed)
Scheduled appts per 9/8 sch msg. Gave pt a print out of AVS.

## 2020-06-23 NOTE — H&P (View-Only) (Signed)
POST OPERATIVE DIALYSIS ACCESS OFFICE NOTE    CC:  F/u for dialysis access surgery  HPI:  This is a 86 y.o. female who is s/p placement of right innominate vein TDC and revision of right arm AV fistula with conversion to brachial artery to cephalic vein fistula on 0/30/09 by Dr. Donzetta Matters.  She presents today with persistent swelling of her right arm for approximately 2 weeks.  Her dialysis clinic has been using her right tunneled dialysis catheter for treatment.  Her son accompanies her today.  She says there is been no reports of treatment issues.  She denies fever or chills.  She had one episode of right forearm pain after dialysis treatment that was relieved with elevation and ice pack.  She has chronic numbness of both hands.  She currently denies hand pain.  Dialysis days:  TTS   Dialysis center:  [facility] ([place])  Allergies  Allergen Reactions  . Aspirin Other (See Comments)    REACTION: STOMACH ISSUES WITH DOSE HIGHER THAN 81 MG  Other reaction(s): GI Upset (intolerance), Other (See Comments) REACTION: STOMACH ISSUES WITH DOSE HIGHER THAN 81 MG  REACTION: STOMACH ISSUES WITH DOSE HIGHER THAN 81 MG   . Hydrocodone-Acetaminophen Nausea And Vomiting  . Morphine Nausea And Vomiting  . Penicillins Rash    Patient took injection and tablets, had a reaction. She has taken amoxicillin with no reaction Has patient had a PCN reaction causing immediate rash, facial/tongue/throat swelling, SOB or lightheadedness with hypotension: Yes Has patient had a PCN reaction causing severe rash involving mucus membranes or skin necrosis: Yes Has patient had a PCN reaction that required hospitalization No Has patient had a PCN reaction occurring within the last 10 years: No If all of the above answers are "NO", then m Patient took injection and tablets, had a reaction. She has taken amoxicillin with no reaction Patient took injection and tablets, had a reaction. She has taken amoxicillin with no  reaction Has patient had a PCN reaction causing immediate rash, facial/tongue/throat swelling, SOB or lightheadedness with hypotension: Yes Has patient had a PCN reaction causing severe rash involving mucus membranes or skin necrosis: Yes Has patient had a PCN reaction that required hospitalization No Has patient had a PCN reaction occurring within the last 10 years: No If all of the above answers are "NO", then m  . Betaine Nausea And Vomiting  . Dacarbazine Nausea And Vomiting  . Ketorolac Tromethamine Itching  . Brimonidine Itching  . Diltiazem Hcl Rash  . Hydrocodone-Acetaminophen Nausea And Vomiting  . Neurontin [Gabapentin] Nausea And Vomiting  . Sulfacetamide Sodium Rash    Current Outpatient Medications  Medication Sig Dispense Refill  . acetaminophen (TYLENOL) 500 MG tablet Take 1,000 mg by mouth 2 (two) times daily as needed for moderate pain.     Marland Kitchen acyclovir (ZOVIRAX) 400 MG tablet Take 1 tablet (400 mg total) by mouth daily. 90 tablet 6  . allopurinol (ZYLOPRIM) 100 MG tablet Take 0.5 tablets (50 mg total) by mouth daily. 30 tablet 0  . aspirin EC 81 MG tablet Take 81 mg by mouth daily. Swallow whole.    . B Complex-C-Zn-Folic Acid (DIALYVITE 233-AQTM 15) 0.8 MG TABS Take 1 tablet by mouth every evening.     Marland Kitchen BIDIL 20-37.5 MG tablet     . cholecalciferol (VITAMIN D3) 25 MCG (1000 UT) tablet Take 1,000 Units by mouth daily.     Marland Kitchen dexamethasone (DECADRON) 2 MG tablet Take only 1 tab on Mondays  and Fridays 30 tablet 0  . dorzolamide (TRUSOPT) 2 % ophthalmic solution Place 1 drop into the left eye 2 (two) times daily.   1  . fluorometholone (FML) 0.1 % ophthalmic suspension Place 1 drop into the right eye daily.     Marland Kitchen glimepiride (AMARYL) 1 MG tablet SMARTSIG:0.5 Pill By Mouth Daily    . heparin 1000 unit/mL SOLN injection Heparin Sodium (Porcine) 1,000 Units/mL Catheter Lock Arterial    . lenalidomide (REVLIMID) 2.5 MG capsule Take 1 capsule three times a week after dialysis  on Tuesday, Thursday and Saturdays 12 capsule 0  . lidocaine-prilocaine (EMLA) cream SMARTSIG:Sparingly Topical    . LUMIGAN 0.01 % SOLN Place 1 drop into the left eye at bedtime.     Marland Kitchen LYRICA 50 MG capsule Take 50 mg by mouth at bedtime.     . midodrine (PROAMATINE) 10 MG tablet TAKE 1 TAB AS DIRECTED TAKE ONE 30 MIN PRIOR TO TX AND THEN TAKE ONE MID TREATMENT IF NEEDED.    Marland Kitchen ondansetron (ZOFRAN) 4 MG tablet Take 1 tablet (4 mg total) by mouth every 8 (eight) hours as needed for nausea. 30 tablet 1  . pantoprazole (PROTONIX) 40 MG tablet Take 1 tablet (40 mg total) by mouth daily as needed (acid reflux/indigestion.). 30 tablet 11  . polyethylene glycol (MIRALAX / GLYCOLAX) 17 g packet Take 17 g by mouth daily.     . prochlorperazine (COMPAZINE) 5 MG tablet TAKE 1 TABLET (5 MG TOTAL) BY MOUTH EVERY 6 (SIX) HOURS AS NEEDED FOR NAUSEA OR VOMITING. 30 tablet 1  . Prochlorperazine Edisylate (COMPAZINE IJ) Take by mouth.    . senna-docusate (SENOKOT-S) 8.6-50 MG tablet Take 2 tablets by mouth 2 (two) times daily. 30 tablet 0  . sevelamer carbonate (RENVELA) 800 MG tablet Take 800-1,600 mg by mouth 3 (three) times daily with meals. Depending on meal size depends on the 1 or 2 tablets    . sodium chloride (OCEAN) 0.65 % SOLN nasal spray Place 1 spray into both nostrils 4 (four) times daily as needed for congestion.     . traMADol (ULTRAM) 50 MG tablet Take 1 tablet (50 mg total) by mouth every 8 (eight) hours as needed. 6 tablet 0   No current facility-administered medications for this visit.     ROS:  See HPI  There were no vitals taken for this visit.   Physical Exam:  General appearance: Well-developed, well-nourished female in no apparent distress.  She ambulates via rolling walker Cardiac: Heart rate and rhythm are regular Respiratory: Lungs are clear to auscultation bilaterally Extremities: There is mild edema of the right arm extending from her shoulder to her wrist.  Her incisions are  well-healed.  Her fistula is well developed with palpable thrill.  Her bruit is mildly pulsatile.  She has no erythema or tenderness to palpation.  Grip strength is 5/5.  Assessment/Plan:   86 y.o. year old female who is s/p placement of right innominate vein TDC and revision of right arm AV fistula with conversion to brachial artery to cephalic vein fistula on 1/32/44 with persistent edema of her right upper extremity for approximately 2 weeks.  We discussed the possibility of central venous stenosis and/or malpositioning of her catheter.  Her dialysis treatment center has been using her catheter since noticing the swelling and she reports no flow issues or clearance issues.  We will plan for fistulogram on nondialysis day.  The patient and her son are in agreement with this plan.  Barbie Banner, PA-C 06/23/2020 9:05 AM [facility] [phone]  Clinic MD:  Scot Dock

## 2020-06-24 ENCOUNTER — Other Ambulatory Visit (HOSPITAL_COMMUNITY)
Admission: RE | Admit: 2020-06-24 | Discharge: 2020-06-24 | Disposition: A | Discharge status: Home / Self Care | Payer: Medicare HMO | Source: Ambulatory Visit | Attending: Vascular Surgery | Admitting: Vascular Surgery

## 2020-06-24 DIAGNOSIS — Z20822 Contact with and (suspected) exposure to covid-19: Secondary | ICD-10-CM | POA: Insufficient documentation

## 2020-06-24 DIAGNOSIS — Z01812 Encounter for preprocedural laboratory examination: Secondary | ICD-10-CM | POA: Insufficient documentation

## 2020-06-24 LAB — SARS CORONAVIRUS 2 (TAT 6-24 HRS): SARS Coronavirus 2: NEGATIVE

## 2020-06-24 LAB — KAPPA/LAMBDA LIGHT CHAINS
Kappa free light chain: 55 mg/L — ABNORMAL HIGH (ref 3.3–19.4)
Kappa, lambda light chain ratio: 2.59 — ABNORMAL HIGH (ref 0.26–1.65)
Lambda free light chains: 21.2 mg/L (ref 5.7–26.3)

## 2020-06-24 LAB — MULTIPLE MYELOMA PANEL, SERUM
Albumin SerPl Elph-Mcnc: 3.1 g/dL (ref 2.9–4.4)
Albumin/Glob SerPl: 1.3 (ref 0.7–1.7)
Alpha 1: 0.3 g/dL (ref 0.0–0.4)
Alpha2 Glob SerPl Elph-Mcnc: 0.7 g/dL (ref 0.4–1.0)
B-Globulin SerPl Elph-Mcnc: 1.3 g/dL (ref 0.7–1.3)
Gamma Glob SerPl Elph-Mcnc: 0.3 g/dL — ABNORMAL LOW (ref 0.4–1.8)
Globulin, Total: 2.5 g/dL (ref 2.2–3.9)
IgA: 24 mg/dL — ABNORMAL LOW (ref 64–422)
IgG (Immunoglobin G), Serum: 987 mg/dL (ref 586–1602)
IgM (Immunoglobulin M), Srm: 10 mg/dL — ABNORMAL LOW (ref 26–217)
M Protein SerPl Elph-Mcnc: 0.6 g/dL — ABNORMAL HIGH
Total Protein ELP: 5.6 g/dL — ABNORMAL LOW (ref 6.0–8.5)

## 2020-06-28 ENCOUNTER — Encounter (HOSPITAL_COMMUNITY)
Admission: RE | Disposition: A | Discharge status: Home / Self Care | Payer: Self-pay | Source: Home / Self Care | Attending: Vascular Surgery

## 2020-06-28 ENCOUNTER — Ambulatory Visit (HOSPITAL_COMMUNITY)
Admission: RE | Admit: 2020-06-28 | Discharge: 2020-06-28 | Disposition: A | Discharge status: Home / Self Care | Payer: Medicare HMO | Attending: Vascular Surgery | Admitting: Vascular Surgery

## 2020-06-28 ENCOUNTER — Encounter (HOSPITAL_COMMUNITY): Payer: Self-pay | Admitting: Vascular Surgery

## 2020-06-28 ENCOUNTER — Other Ambulatory Visit: Payer: Self-pay

## 2020-06-28 DIAGNOSIS — Z79899 Other long term (current) drug therapy: Secondary | ICD-10-CM | POA: Insufficient documentation

## 2020-06-28 DIAGNOSIS — Z992 Dependence on renal dialysis: Secondary | ICD-10-CM | POA: Diagnosis not present

## 2020-06-28 DIAGNOSIS — Z886 Allergy status to analgesic agent status: Secondary | ICD-10-CM | POA: Diagnosis not present

## 2020-06-28 DIAGNOSIS — Z880 Allergy status to penicillin: Secondary | ICD-10-CM | POA: Diagnosis not present

## 2020-06-28 DIAGNOSIS — T82898A Other specified complication of vascular prosthetic devices, implants and grafts, initial encounter: Secondary | ICD-10-CM

## 2020-06-28 DIAGNOSIS — Z882 Allergy status to sulfonamides status: Secondary | ICD-10-CM | POA: Insufficient documentation

## 2020-06-28 DIAGNOSIS — Z885 Allergy status to narcotic agent status: Secondary | ICD-10-CM | POA: Insufficient documentation

## 2020-06-28 DIAGNOSIS — Z7984 Long term (current) use of oral hypoglycemic drugs: Secondary | ICD-10-CM | POA: Diagnosis not present

## 2020-06-28 DIAGNOSIS — N186 End stage renal disease: Secondary | ICD-10-CM | POA: Insufficient documentation

## 2020-06-28 DIAGNOSIS — T82858A Stenosis of vascular prosthetic devices, implants and grafts, initial encounter: Secondary | ICD-10-CM | POA: Insufficient documentation

## 2020-06-28 DIAGNOSIS — N185 Chronic kidney disease, stage 5: Secondary | ICD-10-CM

## 2020-06-28 DIAGNOSIS — Y841 Kidney dialysis as the cause of abnormal reaction of the patient, or of later complication, without mention of misadventure at the time of the procedure: Secondary | ICD-10-CM | POA: Diagnosis not present

## 2020-06-28 DIAGNOSIS — Z7982 Long term (current) use of aspirin: Secondary | ICD-10-CM | POA: Diagnosis not present

## 2020-06-28 DIAGNOSIS — Z888 Allergy status to other drugs, medicaments and biological substances status: Secondary | ICD-10-CM | POA: Insufficient documentation

## 2020-06-28 HISTORY — PX: PERIPHERAL VASCULAR BALLOON ANGIOPLASTY: CATH118281

## 2020-06-28 HISTORY — PX: A/V FISTULAGRAM: CATH118298

## 2020-06-28 LAB — POCT I-STAT, CHEM 8
BUN: 27 mg/dL — ABNORMAL HIGH (ref 8–23)
Calcium, Ion: 1.11 mmol/L — ABNORMAL LOW (ref 1.15–1.40)
Chloride: 95 mmol/L — ABNORMAL LOW (ref 98–111)
Creatinine, Ser: 4.1 mg/dL — ABNORMAL HIGH (ref 0.44–1.00)
Glucose, Bld: 85 mg/dL (ref 70–99)
HCT: 32 % — ABNORMAL LOW (ref 36.0–46.0)
Hemoglobin: 10.9 g/dL — ABNORMAL LOW (ref 12.0–15.0)
Potassium: 3.1 mmol/L — ABNORMAL LOW (ref 3.5–5.1)
Sodium: 138 mmol/L (ref 135–145)
TCO2: 31 mmol/L (ref 22–32)

## 2020-06-28 SURGERY — A/V FISTULAGRAM
Anesthesia: LOCAL | Laterality: Right

## 2020-06-28 MED ORDER — SODIUM CHLORIDE 0.9% FLUSH: 3.0000 mL | Freq: Two times a day (BID) | INTRAVENOUS | Status: DC

## 2020-06-28 MED ORDER — HEPARIN (PORCINE) IN NACL 1000-0.9 UT/500ML-% IV SOLN
INTRAVENOUS | Status: AC
  Filled 2020-06-28: qty 500

## 2020-06-28 MED ORDER — SODIUM CHLORIDE 0.9% FLUSH: 3.0000 mL | INTRAVENOUS | Status: DC | PRN

## 2020-06-28 MED ORDER — LIDOCAINE HCL (PF) 1 % IJ SOLN
INTRAMUSCULAR | Status: DC | PRN
  Administered 2020-06-28: 2 mL via INTRADERMAL

## 2020-06-28 MED ORDER — LIDOCAINE HCL (PF) 1 % IJ SOLN
INTRAMUSCULAR | Status: AC
  Filled 2020-06-28: qty 30

## 2020-06-28 MED ORDER — HEPARIN (PORCINE) IN NACL 1000-0.9 UT/500ML-% IV SOLN
INTRAVENOUS | Status: DC | PRN
  Administered 2020-06-28: 500 mL

## 2020-06-28 MED ORDER — IODIXANOL 320 MG/ML IV SOLN
INTRAVENOUS | Status: DC | PRN
  Administered 2020-06-28: 45 mL

## 2020-06-28 SURGICAL SUPPLY — 17 items
BAG SNAP BAND KOVER 36X36 (MISCELLANEOUS) ×3 IMPLANT
BALLN ATHLETIS 10X40X75 (BALLOONS) ×3
BALLN LUTONIX AV 10X60X75 (BALLOONS) ×3
BALLOON ATHLETIS 10X40X75 (BALLOONS) ×2 IMPLANT
BALLOON LUTONIX AV 10X60X75 (BALLOONS) ×2 IMPLANT
CATH ANGIO 5F BER2 65CM (CATHETERS) ×3 IMPLANT
COVER DOME SNAP 22 D (MISCELLANEOUS) ×3 IMPLANT
KIT ENCORE 26 ADVANTAGE (KITS) ×3 IMPLANT
KIT MICROPUNCTURE NIT STIFF (SHEATH) ×3 IMPLANT
PROTECTION STATION PRESSURIZED (MISCELLANEOUS) ×3
SHEATH PINNACLE R/O II 7F 4CM (SHEATH) ×3 IMPLANT
SHEATH PROBE COVER 6X72 (BAG) ×3 IMPLANT
STATION PROTECTION PRESSURIZED (MISCELLANEOUS) ×2 IMPLANT
STOPCOCK MORSE 400PSI 3WAY (MISCELLANEOUS) ×3 IMPLANT
TRAY PV CATH (CUSTOM PROCEDURE TRAY) ×3 IMPLANT
TUBING CIL FLEX 10 FLL-RA (TUBING) ×3 IMPLANT
WIRE BENTSON .035X145CM (WIRE) ×3 IMPLANT

## 2020-06-28 NOTE — Op Note (Signed)
    Patient name: [name] MRN: [id number] DOB: [birth year] Sex: female  06/28/2020 Pre-operative Diagnosis: End-stage renal disease, swelling right upper extremity Post-operative diagnosis:  Same Surgeon:  Eda Paschal. Donzetta Matters, MD Procedure Performed: 1.  Ultrasound-guided cannulation right arm AV fistula 2.  Right upper extremity and central fistulogram 3.  Drug-coated balloon angioplasty right subclavian vein with 10 x 6 mm lutonix  Indications: 86 year old female on dialysis currently via catheter.  When the catheter was placed it was stuck in the innominate vein on the right.  She has swelling in the right upper extremity consistent with central venous occlusion.  She is indicated for fistulogram possible intervention.  Findings: Fistula self is quite tortuous in the upper arm however there is no evidence of occlusive disease.  We did evaluate with retrograde fistulogram demonstrated patent arterial anastomosis.  At the subclavian vein at the site of the catheter the vein is occluded fills the innominate vein via collateralization.  After drug-coated balloon angioplasty there is no residual stenosis were previously was occluded.  Fistula is ready for cannulation.   Procedure:  The patient was identified in the holding area and taken to room 8.  The patient was then placed supine on the table and prepped and draped in the usual sterile fashion.  A time out was called.  Ultrasound was used to evaluate the right arm AV fistula.  This appeared to be large the areas anesthetized 1% lidocaine cannulated with direct ultrasound visualization with micropuncture needle followed by wire and sheath.  And images saved the permanent record.  Right upper extremity fistulogram central fistulogram was performed.  With the above findings we exchanged over a Bentson wire for a 7 [place] sheath.  Using a Berenstein catheter and Bentson wire were able to traverse centrally.  We then performed primary balloon  angioplasty with an 10 x 4 mm athletis balloon.  While this was inflated we attempted to perform retrograde fistulogram but unfortunately she filled by collaterals.  Pressure was then held we were able to perform retrograde which demonstrated patent arterial anastomosis.  After the balloon was allowed down we performed fistulogram which demonstrated patency of the subclavian vein.  We then performed drug-coated balloon angioplasty with a 10 x 6 mm balloon for 3 minutes at nominal pressure.  Completion demonstrated no residual stenosis were previously was occluded.  Satisfied we removed the wire and sheath the area was anesthetized 1% lidocaine closed with 4 Monocryl suture.  She tolerated procedure well any complication.    Contrast: 45cc   Lillien Petronio C. Donzetta Matters, MD [facility] Office: [phone] Pager: [phone]

## 2020-06-28 NOTE — Discharge Instructions (Signed)

## 2020-06-28 NOTE — Interval H&P Note (Signed)
History and Physical Interval Note:  06/28/2020 7:22 AM  [name]  has presented today for surgery, with the diagnosis of edema.  The various methods of treatment have been discussed with the patient and family. After consideration of risks, benefits and other options for treatment, the patient has consented to  Procedure(s): A/V FISTULAGRAM - Right Upper Arm (N/A) as a surgical intervention.  The patient's history has been reviewed, patient examined, no change in status, stable for surgery.  I have reviewed the patient's chart and labs.  Questions were answered to the patient's satisfaction.     Servando Snare

## 2020-06-28 NOTE — Progress Notes (Signed)
Notified [employer] of K 3.1.  Stated was okay.

## 2020-06-29 ENCOUNTER — Encounter (HOSPITAL_COMMUNITY): Payer: Self-pay | Admitting: Vascular Surgery

## 2020-07-14 ENCOUNTER — Inpatient Hospital Stay: Payer: Medicare HMO

## 2020-07-14 ENCOUNTER — Telehealth: Payer: Self-pay | Admitting: Hematology and Oncology

## 2020-07-14 ENCOUNTER — Encounter: Payer: Self-pay | Admitting: Hematology and Oncology

## 2020-07-14 ENCOUNTER — Other Ambulatory Visit: Payer: Self-pay

## 2020-07-14 ENCOUNTER — Inpatient Hospital Stay (HOSPITAL_BASED_OUTPATIENT_CLINIC_OR_DEPARTMENT_OTHER): Payer: Medicare HMO | Admitting: Hematology and Oncology

## 2020-07-14 DIAGNOSIS — R110 Nausea: Secondary | ICD-10-CM

## 2020-07-14 DIAGNOSIS — Z992 Dependence on renal dialysis: Secondary | ICD-10-CM

## 2020-07-14 DIAGNOSIS — M25552 Pain in left hip: Secondary | ICD-10-CM

## 2020-07-14 DIAGNOSIS — N186 End stage renal disease: Secondary | ICD-10-CM

## 2020-07-14 DIAGNOSIS — Z7189 Other specified counseling: Secondary | ICD-10-CM

## 2020-07-14 DIAGNOSIS — D61818 Other pancytopenia: Secondary | ICD-10-CM

## 2020-07-14 DIAGNOSIS — C9000 Multiple myeloma not having achieved remission: Secondary | ICD-10-CM

## 2020-07-14 DIAGNOSIS — Z5112 Encounter for antineoplastic immunotherapy: Secondary | ICD-10-CM | POA: Diagnosis not present

## 2020-07-14 LAB — CBC WITH DIFFERENTIAL/PLATELET
Abs Immature Granulocytes: 0.01 10*3/uL (ref 0.00–0.07)
Basophils Absolute: 0 10*3/uL (ref 0.0–0.1)
Basophils Relative: 1 %
Eosinophils Absolute: 0.3 10*3/uL (ref 0.0–0.5)
Eosinophils Relative: 7 %
HCT: 31.4 % — ABNORMAL LOW (ref 36.0–46.0)
Hemoglobin: 10.2 g/dL — ABNORMAL LOW (ref 12.0–15.0)
Immature Granulocytes: 0 %
Lymphocytes Relative: 33 %
Lymphs Abs: 1.3 10*3/uL (ref 0.7–4.0)
MCH: 28.4 pg (ref 26.0–34.0)
MCHC: 32.5 g/dL (ref 30.0–36.0)
MCV: 87.5 fL (ref 80.0–100.0)
Monocytes Absolute: 0.6 10*3/uL (ref 0.1–1.0)
Monocytes Relative: 14 %
Neutro Abs: 1.8 10*3/uL (ref 1.7–7.7)
Neutrophils Relative %: 45 %
Platelets: 148 10*3/uL — ABNORMAL LOW (ref 150–400)
RBC: 3.59 MIL/uL — ABNORMAL LOW (ref 3.87–5.11)
RDW: 17.2 % — ABNORMAL HIGH (ref 11.5–15.5)
WBC: 4 10*3/uL (ref 4.0–10.5)
nRBC: 0 % (ref 0.0–0.2)

## 2020-07-14 LAB — COMPREHENSIVE METABOLIC PANEL
ALT: 13 U/L (ref 0–44)
AST: 19 U/L (ref 15–41)
Albumin: 3 g/dL — ABNORMAL LOW (ref 3.5–5.0)
Alkaline Phosphatase: 72 U/L (ref 38–126)
Anion gap: 6 (ref 5–15)
BUN: 17 mg/dL (ref 8–23)
CO2: 35 mmol/L — ABNORMAL HIGH (ref 22–32)
Calcium: 8.2 mg/dL — ABNORMAL LOW (ref 8.9–10.3)
Chloride: 96 mmol/L — ABNORMAL LOW (ref 98–111)
Creatinine, Ser: 3.28 mg/dL (ref 0.44–1.00)
GFR calc Af Amer: 14 mL/min — ABNORMAL LOW (ref >60)
GFR calc non Af Amer: 12 mL/min — ABNORMAL LOW (ref >60)
Glucose, Bld: 87 mg/dL (ref 70–99)
Potassium: 3.4 mmol/L — ABNORMAL LOW (ref 3.5–5.1)
Sodium: 137 mmol/L (ref 135–145)
Total Bilirubin: 0.6 mg/dL (ref 0.3–1.2)
Total Protein: 5.7 g/dL — ABNORMAL LOW (ref 6.5–8.1)

## 2020-07-14 MED ORDER — DARATUMUMAB-HYALURONIDASE-FIHJ 1800-30000 MG-UT/15ML ~~LOC~~ SOLN
1800.0000 mg | Freq: Once | SUBCUTANEOUS | Status: AC
  Administered 2020-07-14: 1800 mg via SUBCUTANEOUS
  Filled 2020-07-14: qty 15

## 2020-07-14 MED ORDER — BORTEZOMIB CHEMO SQ INJECTION 3.5 MG (2.5MG/ML)
0.7800 mg/m2 | Freq: Once | INTRAMUSCULAR | Status: AC
  Administered 2020-07-14: 1.25 mg via SUBCUTANEOUS
  Filled 2020-07-14: qty 0.5

## 2020-07-14 MED ORDER — DEXAMETHASONE 4 MG PO TABS
8.0000 mg | ORAL_TABLET | Freq: Once | ORAL | Status: AC
  Administered 2020-07-14: 8 mg via ORAL

## 2020-07-14 MED ORDER — ACETAMINOPHEN 325 MG PO TABS
ORAL_TABLET | ORAL | Status: AC
  Filled 2020-07-14: qty 2

## 2020-07-14 MED ORDER — PROCHLORPERAZINE MALEATE 10 MG PO TABS
10.0000 mg | ORAL_TABLET | Freq: Once | ORAL | Status: AC
  Administered 2020-07-14: 10 mg via ORAL

## 2020-07-14 MED ORDER — DIPHENHYDRAMINE HCL 25 MG PO CAPS
25.0000 mg | ORAL_CAPSULE | Freq: Once | ORAL | Status: AC
  Administered 2020-07-14: 25 mg via ORAL

## 2020-07-14 MED ORDER — CYANOCOBALAMIN 1000 MCG/ML IJ SOLN
INTRAMUSCULAR | Status: AC
  Filled 2020-07-14: qty 1

## 2020-07-14 MED ORDER — CYANOCOBALAMIN 1000 MCG/ML IJ SOLN
1000.0000 ug | Freq: Once | INTRAMUSCULAR | Status: AC
  Administered 2020-07-14: 1000 ug via INTRAMUSCULAR

## 2020-07-14 MED ORDER — DEXAMETHASONE 4 MG PO TABS
ORAL_TABLET | ORAL | Status: AC
  Filled 2020-07-14: qty 2

## 2020-07-14 MED ORDER — ACETAMINOPHEN 325 MG PO TABS
650.0000 mg | ORAL_TABLET | Freq: Once | ORAL | Status: AC
  Administered 2020-07-14: 650 mg via ORAL

## 2020-07-14 MED ORDER — DIPHENHYDRAMINE HCL 25 MG PO CAPS
ORAL_CAPSULE | ORAL | Status: AC
  Filled 2020-07-14: qty 1

## 2020-07-14 MED ORDER — PROCHLORPERAZINE MALEATE 10 MG PO TABS
ORAL_TABLET | ORAL | Status: AC
  Filled 2020-07-14: qty 1

## 2020-07-14 NOTE — Telephone Encounter (Signed)
Scheduled appts per 9/29 los. Gave pt a print out of AVS.

## 2020-07-14 NOTE — Progress Notes (Signed)
Reported critical creatinine of 3.28 to Dr. Alvy Bimler.

## 2020-07-14 NOTE — Progress Notes (Signed)
Clarified with Dr Alvy Bimler that pt is to receive Velcade today in addition to Darzalex

## 2020-07-14 NOTE — Progress Notes (Signed)
Per Dr. Alvy Bimler, okay to proceed with labs.  Patient is on HD.

## 2020-07-14 NOTE — Progress Notes (Signed)
[facility] OFFICE PROGRESS NOTE  Patient Care Team: Lucianne Lei, MD as PCP - General (Family Medicine) Thompson Grayer, MD as PCP - Electrophysiology (Cardiology) Josue Hector, MD as PCP - Cardiology (Cardiology)  ASSESSMENT & PLAN:  Multiple myeloma not having achieved remission ([place]) I have reviewed blood work with the patient She have very good response with the addition of Revlimid recently She has virtually no side effects from therapy We will proceed with Velcade, daratumumab injection every 3 weeks and she will continue to take lenalidomide 3 times a week after dialysis I recommend dexamethasone taper, hopefully she will complete taper in 2 weeks  Pancytopenia, acquired ([place]) She has multifactorial pancytopenia, likely worsened with recent addition of Revlimid She does not need transfusion support We will monitor closely  ESRD (end stage renal disease) on dialysis [facility]) We discussed the limitations of treatment options due to her end-stage renal disease She will continue hemodialysis  Left hip pain She has chronic left hip pain I recommend conservative approach There is no contraindication for her to get injection to her left hip joint if needed    No orders of the defined types were placed in this encounter.   All questions were answered. The patient knows to call the clinic with any problems, questions or concerns. The total time spent in the appointment was 20 minutes encounter with patients including review of chart and various tests results, discussions about plan of care and coordination of care plan   Heath Lark, MD 07/14/2020 11:55 AM  INTERVAL HISTORY: Please see below for problem oriented charting. She returns for treatment and follow-up She tolerated recent treatment well Dialysis is going on well She continues to have chronic hip pain, stable when she takes pain medicine Denies recent constipation No recent infection, fever or  chills  SUMMARY OF ONCOLOGIC HISTORY: Oncology History  Multiple myeloma not having achieved remission ([place])  03/03/2019 Initial Diagnosis   Multiple myeloma not having achieved remission ([place])   03/03/2019 Imaging   1. Round and oval lucent/lytic lesions in the skull, bilateral humeri, distal left femur, and possibly also in the pelvis are compatible with multiple myeloma. No right lower extremity lytic lesion identified. No pathologic fracture. 2. Advanced degenerative changes in the spine with no vertebral myeloma evident by plain radiography. 3. Advanced degenerative changes in the right upper extremity. Bilateral knee arthroplasty. 4.  Aortic Atherosclerosis (ICD10-I70.0).   03/04/2019 -  Chemotherapy   The patient had bortezomib for chemotherapy treatment.    03/04/2019 Bone Marrow Biopsy   Bone Marrow, Aspirate,Biopsy, and Clot - HYPERCELLULAR BONE MARROW FOR AGE WITH PLASMACYTOSIS. - SEE COMMENT. PERIPHERAL BLOOD: - NORMOCYTIC-NORMOCHROMIC ANEMIA. Diagnosis Note The bone marrow is hypercellular for age with increased number of atypical plasma cells averaging 30% of all cells in the aspirate associated with interstitial infiltrates and numerous variably sized aggregates in the clot and biopsy sections. 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 Confirmatory in situ hybridization for kappa and lambda as well as CD138 will be performed and the results reported in an addendum. The background shows trilineage hematopoiesis with mild non-specific changes primarily involving the erythroid series. Correlation with cytogenetic and FISH studies is recommended.    06/20/2019 -  Chemotherapy   The patient had dexamethasone (DECADRON) tablet 20 mg, 20 mg (100 % of original dose 20 mg), Oral,  Once, 17 of 19 cycles Dose modification: 20 mg (original dose 20 mg, Cycle 1), 8 mg (original dose 8 mg,  Cycle 10) Administration: 20 mg (06/20/2019), 20 mg (06/27/2019), 20 mg (07/04/2019), 20 mg  (07/11/2019), 20 mg (07/18/2019), 20 mg (07/25/2019), 20 mg (08/08/2019), 20 mg (08/15/2019), 20 mg (09/05/2019), 20 mg (12/01/2019), 20 mg (09/26/2019), 20 mg (10/20/2019), 20 mg (11/10/2019), 20 mg (12/22/2019), 20 mg (01/12/2020), 8 mg (02/02/2020), 8 mg (02/23/2020), 8 mg (03/22/2020), 8 mg (04/12/2020), 8 mg (05/03/2020), 8 mg (05/31/2020), 8 mg (06/23/2020) daratumumab-hyaluronidase-fihj (DARZALEX FASPRO) 1800-30000 MG-UT/15ML chemo SQ injection 1,800 mg, 1,800 mg, Subcutaneous,  Once, 17 of 19 cycles Administration: 1,800 mg (06/20/2019), 1,800 mg (06/27/2019), 1,800 mg (07/04/2019), 1,800 mg (07/11/2019), 1,800 mg (07/18/2019), 1,800 mg (07/25/2019), 1,800 mg (08/08/2019), 1,800 mg (08/15/2019), 1,800 mg (09/05/2019), 1,800 mg (12/01/2019), 1,800 mg (02/02/2020), 1,800 mg (09/26/2019), 1,800 mg (10/20/2019), 1,800 mg (11/10/2019), 1,800 mg (12/22/2019), 1,800 mg (01/12/2020), 1,800 mg (02/23/2020), 1,800 mg (03/22/2020), 1,800 mg (04/12/2020), 1,800 mg (05/03/2020), 1,800 mg (05/31/2020), 1,800 mg (06/23/2020)  for chemotherapy treatment.      REVIEW OF SYSTEMS:   Constitutional: Denies fevers, chills or abnormal weight loss Eyes: Denies blurriness of vision Ears, nose, mouth, throat, and face: Denies mucositis or sore throat Respiratory: Denies cough, dyspnea or wheezes Cardiovascular: Denies palpitation, chest discomfort or lower extremity swelling Gastrointestinal:  Denies nausea, heartburn or change in bowel habits Skin: Denies abnormal skin rashes Lymphatics: Denies new lymphadenopathy or easy bruising Neurological:Denies numbness, tingling or new weaknesses Behavioral/Psych: Mood is stable, no new changes  All other systems were reviewed with the patient and are negative.  I have reviewed the past medical history, past surgical history, social history and family history with the patient and they are unchanged from previous note.  ALLERGIES:  is allergic to aspirin, hydrocodone-acetaminophen, morphine, penicillins,  betaine, dacarbazine, ketorolac tromethamine, brimonidine, diltiazem hcl, hydrocodone-acetaminophen, neurontin [gabapentin], and sulfacetamide sodium.  MEDICATIONS:  Current Outpatient Medications  Medication Sig Dispense Refill  . acetaminophen (TYLENOL) 500 MG tablet Take 1,000 mg by mouth 2 (two) times daily as needed for mild pain or moderate pain.     Marland Kitchen acyclovir (ZOVIRAX) 400 MG tablet Take 1 tablet (400 mg total) by mouth daily. 90 tablet 6  . allopurinol (ZYLOPRIM) 100 MG tablet Take 0.5 tablets (50 mg total) by mouth daily. (Patient taking differently: Take 50 mg by mouth at bedtime. ) 30 tablet 0  . aspirin EC 81 MG tablet Take 81 mg by mouth daily. Swallow whole.    . B Complex-C-Zn-Folic Acid (DIALYVITE 465-KCLE 15) 0.8 MG TABS Take 1 tablet by mouth every evening.     . cholecalciferol (VITAMIN D3) 25 MCG (1000 UT) tablet Take 1,000 Units by mouth daily.     . dorzolamide (TRUSOPT) 2 % ophthalmic solution Place 1 drop into both eyes 2 (two) times daily.   1  . fluorometholone (FML) 0.1 % ophthalmic suspension Place 1 drop into the right eye daily.     Marland Kitchen glimepiride (AMARYL) 1 MG tablet Take 0.5 mg by mouth at bedtime.     Marland Kitchen lenalidomide (REVLIMID) 2.5 MG capsule Take 1 capsule three times a week after dialysis on Tuesday, Thursday and Saturdays (Patient taking differently: Take 2.5 mg by mouth See admin instructions. Take 2.5 mg capsule three times a week after dialysis on Tuesday, Thursday and Saturdays) 12 capsule 0  . lidocaine-prilocaine (EMLA) cream Apply 1 application topically as needed [address] [employer]).     Marland Kitchen LUMIGAN 0.01 % SOLN Place 1 drop into the left eye at bedtime.     Marland Kitchen LYRICA 50 MG capsule Take 50 mg by  mouth at bedtime.     . ondansetron (ZOFRAN) 4 MG tablet Take 1 tablet (4 mg total) by mouth every 8 (eight) hours as needed for nausea. 30 tablet 1  . pantoprazole (PROTONIX) 40 MG tablet Take 1 tablet (40 mg total) by mouth daily as needed (acid reflux/indigestion.). 30 tablet  11  . polyethylene glycol (MIRALAX / GLYCOLAX) 17 g packet Take 17 g by mouth daily.     . prochlorperazine (COMPAZINE) 5 MG tablet TAKE 1 TABLET (5 MG TOTAL) BY MOUTH EVERY 6 (SIX) HOURS AS NEEDED FOR NAUSEA OR VOMITING. 30 tablet 1  . senna-docusate (SENOKOT-S) 8.6-50 MG tablet Take 2 tablets by mouth 2 (two) times daily. (Patient taking differently: Take 2 tablets by mouth daily. ) 30 tablet 0  . sevelamer carbonate (RENVELA) 800 MG tablet Take 1,600 mg by mouth 3 (three) times daily with meals.     . sodium chloride (OCEAN) 0.65 % SOLN nasal spray Place 1 spray into both nostrils 4 (four) times daily as needed for congestion.     . traMADol (ULTRAM) 50 MG tablet Take 1 tablet (50 mg total) by mouth every 8 (eight) hours as needed. (Patient taking differently: Take 50 mg by mouth 2 (two) times daily. ) 60 tablet 0   Current Facility-Administered Medications  Medication Dose Route Frequency Provider Last Rate Last Admin  . 0.9 %  sodium chloride infusion  250 mL Intravenous PRN Waynetta Sandy, MD       Facility-Administered Medications Ordered in Other Visits  Medication Dose Route Frequency Provider Last Rate Last Admin  . acetaminophen (TYLENOL) tablet 650 mg  650 mg Oral Once Alvy Bimler, Jayleen Scaglione, MD      . daratumumab-hyaluronidase-fihj (DARZALEX FASPRO) 1800-30000 MG-UT/15ML chemo SQ injection 1,800 mg  1,800 mg Subcutaneous Once Alvy Bimler, Sharbel Sahagun, MD      . dexamethasone (DECADRON) tablet 8 mg  8 mg Oral Once Alvy Bimler, Josph Norfleet, MD      . diphenhydrAMINE (BENADRYL) capsule 25 mg  25 mg Oral Once Heath Lark, MD        PHYSICAL EXAMINATION: ECOG PERFORMANCE STATUS: 2 - Symptomatic, <50% confined to bed  Vitals:   07/14/20 1054  BP: (!) 113/44  Pulse: 70  Resp: 18  Temp: 97.7 F (36.5 C)  SpO2: 94%   Filed Weights   07/14/20 1054  Weight: 121 lb 9.6 oz (55.2 kg)    GENERAL:alert, no distress and comfortable SKIN: skin color, texture, turgor are normal, no rashes or significant  lesions EYES: normal, Conjunctiva are pink and non-injected, sclera clear OROPHARYNX:no exudate, no erythema and lips, buccal mucosa, and tongue normal  NECK: supple, thyroid normal size, non-tender, without nodularity LYMPH:  no palpable lymphadenopathy in the cervical, axillary or inguinal LUNGS: clear to auscultation and percussion with normal breathing effort HEART: regular rate & rhythm and no murmurs and no lower extremity edema ABDOMEN:abdomen soft, non-tender and normal bowel sounds Musculoskeletal:no cyanosis of digits and no clubbing  NEURO: alert & oriented x 3 with fluent speech, no focal motor/sensory deficits  LABORATORY DATA:  I have reviewed the data as listed    Component Value Date/Time   NA 137 07/14/2020 1021   K 3.4 (L) 07/14/2020 1021   CL 96 (L) 07/14/2020 1021   CO2 35 (H) 07/14/2020 1021   GLUCOSE 87 07/14/2020 1021   BUN 17 07/14/2020 1021   CREATININE 3.28 (HH) 07/14/2020 1021   CREATININE 2.71 (H) 10/20/2019 1128   CREATININE 1.03 (H) 05/11/2016 8502  CALCIUM 8.2 (L) 07/14/2020 1021   PROT 5.7 (L) 07/14/2020 1021   ALBUMIN 3.0 (L) 07/14/2020 1021   AST 19 07/14/2020 1021   AST 18 10/20/2019 1128   ALT 13 07/14/2020 1021   ALT 10 10/20/2019 1128   ALKPHOS 72 07/14/2020 1021   BILITOT 0.6 07/14/2020 1021   BILITOT 0.4 10/20/2019 1128   GFRNONAA 12 (L) 07/14/2020 1021   GFRNONAA 15 (L) 10/20/2019 1128   GFRAA 14 (L) 07/14/2020 1021   GFRAA 18 (L) 10/20/2019 1128    No results found for: SPEP, UPEP  Lab Results  Component Value Date   WBC 4.0 07/14/2020   NEUTROABS 1.8 07/14/2020   HGB 10.2 (L) 07/14/2020   HCT 31.4 (L) 07/14/2020   MCV 87.5 07/14/2020   PLT 148 (L) 07/14/2020      Chemistry      Component Value Date/Time   NA 137 07/14/2020 1021   K 3.4 (L) 07/14/2020 1021   CL 96 (L) 07/14/2020 1021   CO2 35 (H) 07/14/2020 1021   BUN 17 07/14/2020 1021   CREATININE 3.28 (HH) 07/14/2020 1021   CREATININE 2.71 (H) 10/20/2019 1128    CREATININE 1.03 (H) 05/11/2016 0931      Component Value Date/Time   CALCIUM 8.2 (L) 07/14/2020 1021   ALKPHOS 72 07/14/2020 1021   AST 19 07/14/2020 1021   AST 18 10/20/2019 1128   ALT 13 07/14/2020 1021   ALT 10 10/20/2019 1128   BILITOT 0.6 07/14/2020 1021   BILITOT 0.4 10/20/2019 1128

## 2020-07-14 NOTE — Assessment & Plan Note (Signed)
We discussed the limitations of treatment options due to her end-stage renal disease °She will continue hemodialysis °

## 2020-07-14 NOTE — Assessment & Plan Note (Signed)
She has multifactorial pancytopenia, likely worsened with recent addition of Revlimid She does not need transfusion support We will monitor closely

## 2020-07-14 NOTE — Assessment & Plan Note (Signed)
She has chronic left hip pain I recommend conservative approach There is no contraindication for her to get injection to her left hip joint if needed

## 2020-07-14 NOTE — Assessment & Plan Note (Signed)
I have reviewed blood work with the patient She have very good response with the addition of Revlimid recently She has virtually no side effects from therapy We will proceed with Velcade, daratumumab injection every 3 weeks and she will continue to take lenalidomide 3 times a week after dialysis I recommend dexamethasone taper, hopefully she will complete taper in 2 weeks

## 2020-07-14 NOTE — Patient Instructions (Addendum)
[facility] Discharge Instructions for Patients Receiving Chemotherapy  Today you received the following chemotherapy agents: Darzalex Faspro/Velcade.  To help prevent nausea and vomiting after your treatment, we encourage you to take your nausea medication as directed.   If you develop nausea and vomiting that is not controlled by your nausea medication, call the clinic.   BELOW ARE SYMPTOMS THAT SHOULD BE REPORTED IMMEDIATELY:  *FEVER GREATER THAN 100.5 F  *CHILLS WITH OR WITHOUT FEVER  NAUSEA AND VOMITING THAT IS NOT CONTROLLED WITH YOUR NAUSEA MEDICATION  *UNUSUAL SHORTNESS OF BREATH  *UNUSUAL BRUISING OR BLEEDING  TENDERNESS IN MOUTH AND THROAT WITH OR WITHOUT PRESENCE OF ULCERS  *URINARY PROBLEMS  *BOWEL PROBLEMS  UNUSUAL RASH Items with * indicate a potential emergency and should be followed up as soon as possible.  Feel free to call the clinic should you have any questions or concerns. The clinic phone number is [phone].  Please show the CHEMO ALERT CARD at check-in to the Emergency Department and triage nurse.  Cyanocobalamin, Vitamin B12 injection What is this medicine? CYANOCOBALAMIN (sye an oh koe BAL a min) is a man made form of vitamin B12. Vitamin B12 is used in the growth of healthy blood cells, nerve cells, and proteins in the body. It also helps with the metabolism of fats and carbohydrates. This medicine is used to treat people who can not absorb vitamin B12. This medicine may be used for other purposes; ask your health care provider or pharmacist if you have questions. COMMON BRAND NAME(S): B-12 Compliance Kit, B-12 Injection Kit, Cyomin, LA-12, Nutri-Twelve, Physicians EZ Use B-12, Primabalt What should I tell my health care provider before I take this medicine? They need to know if you have any of these conditions:  kidney disease  Leber's disease  megaloblastic anemia  an unusual or allergic reaction to cyanocobalamin,  cobalt, other medicines, foods, dyes, or preservatives  pregnant or trying to get pregnant  breast-feeding How should I use this medicine? This medicine is injected into a muscle or deeply under the skin. It is usually given by a health care professional in a clinic or doctor's office. However, your doctor may teach you how to inject yourself. Follow all instructions. Talk to your pediatrician regarding the use of this medicine in children. Special care may be needed. Overdosage: If you think you have taken too much of this medicine contact a poison control center or emergency room at once. NOTE: This medicine is only for you. Do not share this medicine with others. What if I miss a dose? If you are given your dose at a clinic or doctor's office, call to reschedule your appointment. If you give your own injections and you miss a dose, take it as soon as you can. If it is almost time for your next dose, take only that dose. Do not take double or extra doses. What may interact with this medicine?  colchicine  heavy alcohol intake This list may not describe all possible interactions. Give your health care provider a list of all the medicines, herbs, non-prescription drugs, or dietary supplements you use. Also tell them if you smoke, drink alcohol, or use illegal drugs. Some items may interact with your medicine. What should I watch for while using this medicine? Visit your doctor or health care professional regularly. You may need blood work done while you are taking this medicine. You may need to follow a special diet. Talk to your doctor. Limit your alcohol intake   and avoid smoking to get the best benefit. What side effects may I notice from receiving this medicine? Side effects that you should report to your doctor or health care professional as soon as possible:  allergic reactions like skin rash, itching or hives, swelling of the face, lips, or tongue  blue tint to skin  chest  tightness, pain  difficulty breathing, wheezing  dizziness  red, swollen painful area on the leg Side effects that usually do not require medical attention (report to your doctor or health care professional if they continue or are bothersome):  diarrhea  headache This list may not describe all possible side effects. Call your doctor for medical advice about side effects. You may report side effects to FDA at 1-800-FDA-1088. Where should I keep my medicine? Keep out of the reach of children. Store at room temperature between 15 and 30 degrees C (59 and 85 degrees F). Protect from light. Throw away any unused medicine after the expiration date. NOTE: This sheet is a summary. It may not cover all possible information. If you have questions about this medicine, talk to your doctor, pharmacist, or health care provider.  2020 Elsevier/Gold Standard (2008-01-13 22:10:20)  

## 2020-07-21 ENCOUNTER — Other Ambulatory Visit: Payer: Self-pay | Admitting: Hematology and Oncology

## 2020-07-29 ENCOUNTER — Other Ambulatory Visit: Payer: Self-pay

## 2020-07-29 ENCOUNTER — Encounter: Payer: Self-pay | Admitting: Hematology and Oncology

## 2020-07-29 MED ORDER — LACTULOSE 10 GM/15ML PO SOLN: 10.0000 g | Freq: Every day | ORAL | 0 refills | Status: DC | PRN

## 2020-08-04 ENCOUNTER — Inpatient Hospital Stay: Payer: Medicare HMO | Admitting: Hematology and Oncology

## 2020-08-04 ENCOUNTER — Inpatient Hospital Stay: Payer: Medicare HMO

## 2020-08-04 ENCOUNTER — Encounter: Payer: Self-pay | Admitting: Hematology and Oncology

## 2020-08-04 ENCOUNTER — Inpatient Hospital Stay: Payer: Medicare HMO | Attending: Hematology and Oncology

## 2020-08-04 ENCOUNTER — Other Ambulatory Visit: Payer: Self-pay

## 2020-08-04 VITALS — BP 104/53 | HR 78 | Temp 97.5°F | Resp 18 | Ht 65.0 in | Wt 123.4 lb

## 2020-08-04 DIAGNOSIS — R110 Nausea: Secondary | ICD-10-CM

## 2020-08-04 DIAGNOSIS — K5900 Constipation, unspecified: Secondary | ICD-10-CM | POA: Insufficient documentation

## 2020-08-04 DIAGNOSIS — C9000 Multiple myeloma not having achieved remission: Secondary | ICD-10-CM | POA: Insufficient documentation

## 2020-08-04 DIAGNOSIS — D61818 Other pancytopenia: Secondary | ICD-10-CM | POA: Diagnosis not present

## 2020-08-04 DIAGNOSIS — N186 End stage renal disease: Secondary | ICD-10-CM | POA: Insufficient documentation

## 2020-08-04 DIAGNOSIS — Z7189 Other specified counseling: Secondary | ICD-10-CM

## 2020-08-04 DIAGNOSIS — Z992 Dependence on renal dialysis: Secondary | ICD-10-CM

## 2020-08-04 DIAGNOSIS — G893 Neoplasm related pain (acute) (chronic): Secondary | ICD-10-CM

## 2020-08-04 DIAGNOSIS — Z79899 Other long term (current) drug therapy: Secondary | ICD-10-CM | POA: Insufficient documentation

## 2020-08-04 DIAGNOSIS — E538 Deficiency of other specified B group vitamins: Secondary | ICD-10-CM | POA: Diagnosis not present

## 2020-08-04 DIAGNOSIS — Z5112 Encounter for antineoplastic immunotherapy: Secondary | ICD-10-CM | POA: Insufficient documentation

## 2020-08-04 DIAGNOSIS — K5909 Other constipation: Secondary | ICD-10-CM

## 2020-08-04 LAB — CBC WITH DIFFERENTIAL/PLATELET
Abs Immature Granulocytes: 0.01 10*3/uL (ref 0.00–0.07)
Basophils Absolute: 0 10*3/uL (ref 0.0–0.1)
Basophils Relative: 1 %
Eosinophils Absolute: 0.1 10*3/uL (ref 0.0–0.5)
Eosinophils Relative: 5 %
HCT: 30.7 % — ABNORMAL LOW (ref 36.0–46.0)
Hemoglobin: 9.8 g/dL — ABNORMAL LOW (ref 12.0–15.0)
Immature Granulocytes: 0 %
Lymphocytes Relative: 29 %
Lymphs Abs: 0.8 10*3/uL (ref 0.7–4.0)
MCH: 28.7 pg (ref 26.0–34.0)
MCHC: 31.9 g/dL (ref 30.0–36.0)
MCV: 89.8 fL (ref 80.0–100.0)
Monocytes Absolute: 0.4 10*3/uL (ref 0.1–1.0)
Monocytes Relative: 13 %
Neutro Abs: 1.5 10*3/uL — ABNORMAL LOW (ref 1.7–7.7)
Neutrophils Relative %: 52 %
Platelets: 141 10*3/uL — ABNORMAL LOW (ref 150–400)
RBC: 3.42 MIL/uL — ABNORMAL LOW (ref 3.87–5.11)
RDW: 16.4 % — ABNORMAL HIGH (ref 11.5–15.5)
WBC: 2.9 10*3/uL — ABNORMAL LOW (ref 4.0–10.5)
nRBC: 0 % (ref 0.0–0.2)

## 2020-08-04 LAB — COMPREHENSIVE METABOLIC PANEL
ALT: 11 U/L (ref 0–44)
AST: 19 U/L (ref 15–41)
Albumin: 2.9 g/dL — ABNORMAL LOW (ref 3.5–5.0)
Alkaline Phosphatase: 60 U/L (ref 38–126)
Anion gap: 11 (ref 5–15)
BUN: 18 mg/dL (ref 8–23)
CO2: 34 mmol/L — ABNORMAL HIGH (ref 22–32)
Calcium: 8.4 mg/dL — ABNORMAL LOW (ref 8.9–10.3)
Chloride: 93 mmol/L — ABNORMAL LOW (ref 98–111)
Creatinine, Ser: 2.9 mg/dL — ABNORMAL HIGH (ref 0.44–1.00)
GFR, Estimated: 14 mL/min — ABNORMAL LOW (ref >60)
Glucose, Bld: 101 mg/dL — ABNORMAL HIGH (ref 70–99)
Potassium: 3.4 mmol/L — ABNORMAL LOW (ref 3.5–5.1)
Sodium: 138 mmol/L (ref 135–145)
Total Bilirubin: 0.5 mg/dL (ref 0.3–1.2)
Total Protein: 5.6 g/dL — ABNORMAL LOW (ref 6.5–8.1)

## 2020-08-04 MED ORDER — DEXAMETHASONE 4 MG PO TABS
ORAL_TABLET | ORAL | Status: AC
  Filled 2020-08-04: qty 2

## 2020-08-04 MED ORDER — CYANOCOBALAMIN 1000 MCG/ML IJ SOLN
1000.0000 ug | Freq: Once | INTRAMUSCULAR | Status: AC
  Administered 2020-08-04: 1000 ug via INTRAMUSCULAR

## 2020-08-04 MED ORDER — OXYCODONE HCL 5 MG PO TABS: 5.0000 mg | ORAL_TABLET | ORAL | 0 refills | Status: DC | PRN

## 2020-08-04 MED ORDER — PROCHLORPERAZINE MALEATE 10 MG PO TABS
ORAL_TABLET | ORAL | Status: AC
  Filled 2020-08-04: qty 1

## 2020-08-04 MED ORDER — ACETAMINOPHEN 325 MG PO TABS
650.0000 mg | ORAL_TABLET | Freq: Once | ORAL | Status: AC
  Administered 2020-08-04: 650 mg via ORAL

## 2020-08-04 MED ORDER — DIPHENHYDRAMINE HCL 25 MG PO CAPS
ORAL_CAPSULE | ORAL | Status: AC
  Filled 2020-08-04: qty 1

## 2020-08-04 MED ORDER — DEXAMETHASONE 4 MG PO TABS
8.0000 mg | ORAL_TABLET | Freq: Once | ORAL | Status: AC
  Administered 2020-08-04: 8 mg via ORAL

## 2020-08-04 MED ORDER — ACETAMINOPHEN 325 MG PO TABS
ORAL_TABLET | ORAL | Status: AC
  Filled 2020-08-04: qty 2

## 2020-08-04 MED ORDER — PROCHLORPERAZINE MALEATE 10 MG PO TABS
10.0000 mg | ORAL_TABLET | Freq: Once | ORAL | Status: AC
  Administered 2020-08-04: 10 mg via ORAL

## 2020-08-04 MED ORDER — DIPHENHYDRAMINE HCL 25 MG PO CAPS
25.0000 mg | ORAL_CAPSULE | Freq: Once | ORAL | Status: AC
  Administered 2020-08-04: 25 mg via ORAL

## 2020-08-04 MED ORDER — TRAMADOL HCL 50 MG PO TABS
50.0000 mg | ORAL_TABLET | Freq: Three times a day (TID) | ORAL | 0 refills | Status: DC | PRN
Start: 2020-08-04 — End: 2020-08-25

## 2020-08-04 MED ORDER — DARATUMUMAB-HYALURONIDASE-FIHJ 1800-30000 MG-UT/15ML ~~LOC~~ SOLN
1800.0000 mg | Freq: Once | SUBCUTANEOUS | Status: AC
  Administered 2020-08-04: 1800 mg via SUBCUTANEOUS
  Filled 2020-08-04: qty 15

## 2020-08-04 MED ORDER — BORTEZOMIB CHEMO SQ INJECTION 3.5 MG (2.5MG/ML)
0.7800 mg/m2 | Freq: Once | INTRAMUSCULAR | Status: AC
  Administered 2020-08-04: 1.25 mg via SUBCUTANEOUS
  Filled 2020-08-04: qty 0.5

## 2020-08-04 MED ORDER — CYANOCOBALAMIN 1000 MCG/ML IJ SOLN
INTRAMUSCULAR | Status: AC
  Filled 2020-08-04: qty 1

## 2020-08-04 NOTE — Patient Instructions (Signed)
[facility] Discharge Instructions for Patients Receiving Chemotherapy  Today you received the following chemotherapy agents: Darzalex Faspro/Velcade.  To help prevent nausea and vomiting after your treatment, we encourage you to take your nausea medication as directed.   If you develop nausea and vomiting that is not controlled by your nausea medication, call the clinic.   BELOW ARE SYMPTOMS THAT SHOULD BE REPORTED IMMEDIATELY:  *FEVER GREATER THAN 100.5 F  *CHILLS WITH OR WITHOUT FEVER  NAUSEA AND VOMITING THAT IS NOT CONTROLLED WITH YOUR NAUSEA MEDICATION  *UNUSUAL SHORTNESS OF BREATH  *UNUSUAL BRUISING OR BLEEDING  TENDERNESS IN MOUTH AND THROAT WITH OR WITHOUT PRESENCE OF ULCERS  *URINARY PROBLEMS  *BOWEL PROBLEMS  UNUSUAL RASH Items with * indicate a potential emergency and should be followed up as soon as possible.  Feel free to call the clinic should you have any questions or concerns. The clinic phone number is [phone].  Please show the [place] at check-in to the Emergency Department and triage nurse.  Cyanocobalamin, Vitamin B12 injection What is this medicine? CYANOCOBALAMIN (sye an oh koe BAL a min) is a man made form of vitamin B12. Vitamin B12 is used in the growth of healthy blood cells, nerve cells, and proteins in the body. It also helps with the metabolism of fats and carbohydrates. This medicine is used to treat people who can not absorb vitamin B12. This medicine may be used for other purposes; ask your health care provider or pharmacist if you have questions. COMMON BRAND NAME(S): B-12 Compliance Kit, B-12 Injection Kit, Cyomin, LA-12, Nutri-Twelve, Physicians EZ Use B-12, Primabalt What should I tell my health care provider before I take this medicine? They need to know if you have any of these conditions:  kidney disease  Leber's disease  megaloblastic anemia  an unusual or allergic reaction to cyanocobalamin,  cobalt, other medicines, foods, dyes, or preservatives  pregnant or trying to get pregnant  breast-feeding How should I use this medicine? This medicine is injected into a muscle or deeply under the skin. It is usually given by a health care professional in a clinic or doctor's office. However, your doctor may teach you how to inject yourself. Follow all instructions. Talk to your pediatrician regarding the use of this medicine in children. Special care may be needed. Overdosage: If you think you have taken too much of this medicine contact a poison control center or emergency room at once. NOTE: This medicine is only for you. Do not share this medicine with others. What if I miss a dose? If you are given your dose at a clinic or doctor's office, call to reschedule your appointment. If you give your own injections and you miss a dose, take it as soon as you can. If it is almost time for your next dose, take only that dose. Do not take double or extra doses. What may interact with this medicine?  colchicine  heavy alcohol intake This list may not describe all possible interactions. Give your health care provider a list of all the medicines, herbs, non-prescription drugs, or dietary supplements you use. Also tell them if you smoke, drink alcohol, or use illegal drugs. Some items may interact with your medicine. What should I watch for while using this medicine? Visit your doctor or health care professional regularly. You may need blood work done while you are taking this medicine. You may need to follow a special diet. Talk to your doctor. Limit your alcohol intake  and avoid smoking to get the best benefit. What side effects may I notice from receiving this medicine? Side effects that you should report to your doctor or health care professional as soon as possible:  allergic reactions like skin rash, itching or hives, swelling of the face, lips, or tongue  blue tint to skin  chest  tightness, pain  difficulty breathing, wheezing  dizziness  red, swollen painful area on the leg Side effects that usually do not require medical attention (report to your doctor or health care professional if they continue or are bothersome):  diarrhea  headache This list may not describe all possible side effects. Call your doctor for medical advice about side effects. You may report side effects to FDA at 1-800-FDA-1088. Where should I keep my medicine? Keep out of the reach of children. Store at room temperature between 15 and 30 degrees C (59 and 85 degrees F). Protect from light. Throw away any unused medicine after the expiration date. NOTE: This sheet is a summary. It may not cover all possible information. If you have questions about this medicine, talk to your doctor, pharmacist, or health care provider.  2020 Elsevier/Gold Standard (2008-01-13 22:10:20)

## 2020-08-04 NOTE — Assessment & Plan Note (Signed)
She will continue taking pain medicine as needed I warned her about risk of sedation and constipation We discussed narcotic refill policy I refilled both tramadol and oxycodone today

## 2020-08-04 NOTE — Assessment & Plan Note (Signed)
We discussed the limitations of treatment options due to her end-stage renal disease °She will continue hemodialysis °

## 2020-08-04 NOTE — Assessment & Plan Note (Signed)
We discussed the importance of aggressive laxative therapy while on chronic pain medicine 

## 2020-08-04 NOTE — Progress Notes (Signed)
[place] OFFICE PROGRESS NOTE  Patient Care Team: Lucianne Lei, MD as PCP - General (Family Medicine) Thompson Grayer, MD as PCP - Electrophysiology (Cardiology) Josue Hector, MD as PCP - Cardiology (Cardiology)  ASSESSMENT & PLAN:  Multiple myeloma not having achieved remission [facility]) She have very good response with the addition of Revlimid recently She has virtually no side effects from therapy We will proceed with Velcade, daratumumab injection every 3 weeks and she will continue to take lenalidomide 3 times a week after dialysis Due to end-stage renal disease, she is not a candidate for bisphosphonate therapy She will continue vitamin D supplement along with calcium as managed by nephrologist  ESRD (end stage renal disease) on dialysis [facility]) We discussed the limitations of treatment options due to her end-stage renal disease She will continue hemodialysis  Cancer associated pain She will continue taking pain medicine as needed I warned her about risk of sedation and constipation We discussed narcotic refill policy I refilled both tramadol and oxycodone today  Pancytopenia, acquired ([place]) She has multifactorial pancytopenia, likely worsened with recent addition of Revlimid She does not need transfusion support We will monitor closely  Vitamin B12 deficiency She will continue vitamin B12 injections with each treatment  Other constipation We discussed the importance of aggressive laxative therapy while on chronic pain medicine   Orders Placed This Encounter  Procedures  . Kappa/lambda light chains    Standing Status:   Standing    Number of Occurrences:   22    Standing Expiration Date:   08/04/2021  . Multiple Myeloma Panel (SPEP&IFE w/QIG)    Standing Status:   Standing    Number of Occurrences:   22    Standing Expiration Date:   08/04/2021    All questions were answered. The patient knows to call the clinic with any problems, questions or  concerns. The total time spent in the appointment was 30 minutes encounter with patients including review of chart and various tests results, discussions about plan of care and coordination of care plan   Heath Lark, MD 08/04/2020 1:22 PM  INTERVAL HISTORY: Please see below for problem oriented charting. She returns with her son for further follow-up and on treatment She is doing well except for intermittent left hip pain She has been taking tramadol along with oxycodone as needed She continues to have chronic constipation but she takes laxative on a regular basis No recent infection, fever or chills No other new areas of bone pain Denies side effects such as nausea from chemotherapy  SUMMARY OF ONCOLOGIC HISTORY: Oncology History  Multiple myeloma not having achieved remission ([place])  03/03/2019 Initial Diagnosis   Multiple myeloma not having achieved remission ([place])   03/03/2019 Imaging   1. Round and oval lucent/lytic lesions in the skull, bilateral humeri, distal left femur, and possibly also in the pelvis are compatible with multiple myeloma. No right lower extremity lytic lesion identified. No pathologic fracture. 2. Advanced degenerative changes in the spine with no vertebral myeloma evident by plain radiography. 3. Advanced degenerative changes in the right upper extremity. Bilateral knee arthroplasty. 4.  Aortic Atherosclerosis (ICD10-I70.0).   03/04/2019 -  Chemotherapy   The patient had bortezomib for chemotherapy treatment.    03/04/2019 Bone Marrow Biopsy   Bone Marrow, Aspirate,Biopsy, and Clot - HYPERCELLULAR BONE MARROW FOR AGE WITH PLASMACYTOSIS. - SEE COMMENT. PERIPHERAL BLOOD: - NORMOCYTIC-NORMOCHROMIC ANEMIA. Diagnosis Note The bone marrow is hypercellular for age with increased number of atypical plasma cells  averaging 30% of all cells in the aspirate associated with interstitial infiltrates and numerous variably sized aggregates in the clot and biopsy sections.  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 Confirmatory in situ hybridization for kappa and lambda as well as CD138 will be performed and the results reported in an addendum. The background shows trilineage hematopoiesis with mild non-specific changes primarily involving the erythroid series. Correlation with cytogenetic and FISH studies is recommended.    06/20/2019 -  Chemotherapy   The patient had dexamethasone (DECADRON) tablet 20 mg, 20 mg (100 % of original dose 20 mg), Oral,  Once, 18 of 21 cycles Dose modification: 20 mg (original dose 20 mg, Cycle 1), 8 mg (original dose 8 mg, Cycle 10) Administration: 20 mg (06/20/2019), 20 mg (06/27/2019), 20 mg (07/04/2019), 20 mg (07/11/2019), 20 mg (07/18/2019), 20 mg (07/25/2019), 20 mg (08/08/2019), 20 mg (08/15/2019), 20 mg (09/05/2019), 20 mg (12/01/2019), 20 mg (09/26/2019), 20 mg (10/20/2019), 20 mg (11/10/2019), 20 mg (12/22/2019), 20 mg (01/12/2020), 8 mg (02/02/2020), 8 mg (02/23/2020), 8 mg (03/22/2020), 8 mg (04/12/2020), 8 mg (05/03/2020), 8 mg (05/31/2020), 8 mg (06/23/2020), 8 mg (07/14/2020), 8 mg (08/04/2020) daratumumab-hyaluronidase-fihj (DARZALEX FASPRO) 1800-30000 MG-UT/15ML chemo SQ injection 1,800 mg, 1,800 mg, Subcutaneous,  Once, 18 of 21 cycles Administration: 1,800 mg (06/20/2019), 1,800 mg (06/27/2019), 1,800 mg (07/04/2019), 1,800 mg (07/11/2019), 1,800 mg (07/18/2019), 1,800 mg (07/25/2019), 1,800 mg (08/08/2019), 1,800 mg (08/15/2019), 1,800 mg (09/05/2019), 1,800 mg (12/01/2019), 1,800 mg (02/02/2020), 1,800 mg (09/26/2019), 1,800 mg (10/20/2019), 1,800 mg (11/10/2019), 1,800 mg (12/22/2019), 1,800 mg (01/12/2020), 1,800 mg (02/23/2020), 1,800 mg (03/22/2020), 1,800 mg (04/12/2020), 1,800 mg (05/03/2020), 1,800 mg (05/31/2020), 1,800 mg (06/23/2020), 1,800 mg (07/14/2020), 1,800 mg (08/04/2020)  for chemotherapy treatment.      REVIEW OF SYSTEMS:   Constitutional: Denies fevers, chills or abnormal weight loss Eyes: Denies blurriness of vision Ears, nose,  mouth, throat, and face: Denies mucositis or sore throat Respiratory: Denies cough, dyspnea or wheezes Cardiovascular: Denies palpitation, chest discomfort or lower extremity swelling Skin: Denies abnormal skin rashes Lymphatics: Denies new lymphadenopathy or easy bruising Neurological:Denies numbness, tingling or new weaknesses Behavioral/Psych: Mood is stable, no new changes  All other systems were reviewed with the patient and are negative.  I have reviewed the past medical history, past surgical history, social history and family history with the patient and they are unchanged from previous note.  ALLERGIES:  is allergic to aspirin, hydrocodone-acetaminophen, morphine, penicillins, betaine, dacarbazine, ketorolac tromethamine, brimonidine, diltiazem hcl, hydrocodone-acetaminophen, neurontin [gabapentin], and sulfacetamide sodium.  MEDICATIONS:  Current Outpatient Medications  Medication Sig Dispense Refill  . acetaminophen (TYLENOL) 500 MG tablet Take 1,000 mg by mouth 2 (two) times daily as needed for mild pain or moderate pain.     Marland Kitchen acyclovir (ZOVIRAX) 400 MG tablet Take 1 tablet (400 mg total) by mouth daily. 90 tablet 6  . allopurinol (ZYLOPRIM) 100 MG tablet Take 0.5 tablets (50 mg total) by mouth daily. (Patient taking differently: Take 50 mg by mouth at bedtime. ) 30 tablet 0  . aspirin EC 81 MG tablet Take 81 mg by mouth daily. Swallow whole.    . B Complex-C-Zn-Folic Acid (DIALYVITE 062-IRSW 15) 0.8 MG TABS Take 1 tablet by mouth every evening.     . cholecalciferol (VITAMIN D3) 25 MCG (1000 UT) tablet Take 1,000 Units by mouth daily.     . dorzolamide (TRUSOPT) 2 % ophthalmic solution Place 1 drop into both eyes 2 (two) times daily.   1  .  fluorometholone (FML) 0.1 % ophthalmic suspension Place 1 drop into the right eye daily.     Marland Kitchen glimepiride (AMARYL) 1 MG tablet Take 0.5 mg by mouth at bedtime.     Marland Kitchen lactulose (CHRONULAC) 10 GM/15ML solution Take 15 mLs (10 g total) by  mouth daily as needed for mild constipation. 473 mL 0  . lenalidomide (REVLIMID) 2.5 MG capsule Take 1 capsule three times a week after dialysis on Tuesday, Thursday and Saturdays (Patient taking differently: Take 2.5 mg by mouth See admin instructions. Take 2.5 mg capsule three times a week after dialysis on Tuesday, Thursday and Saturdays) 12 capsule 0  . lidocaine-prilocaine (EMLA) cream Apply 1 application topically as needed [facility]).     Marland Kitchen LUMIGAN 0.01 % SOLN Place 1 drop into the left eye at bedtime.     Marland Kitchen LYRICA 50 MG capsule Take 50 mg by mouth at bedtime.     . ondansetron (ZOFRAN) 4 MG tablet TAKE 1 TABLET BY MOUTH EVERY 8 HOURS AS NEEDED FOR NAUSEA 30 tablet 1  . oxyCODONE (OXY IR/ROXICODONE) 5 MG immediate release tablet Take 1 tablet (5 mg total) by mouth every 4 (four) hours as needed for severe pain. 60 tablet 0  . pantoprazole (PROTONIX) 40 MG tablet Take 1 tablet (40 mg total) by mouth daily as needed (acid reflux/indigestion.). 30 tablet 11  . polyethylene glycol (MIRALAX / GLYCOLAX) 17 g packet Take 17 g by mouth daily.     . prochlorperazine (COMPAZINE) 5 MG tablet TAKE 1 TABLET (5 MG TOTAL) BY MOUTH EVERY 6 (SIX) HOURS AS NEEDED FOR NAUSEA OR VOMITING. 30 tablet 1  . senna-docusate (SENOKOT-S) 8.6-50 MG tablet Take 2 tablets by mouth 2 (two) times daily. (Patient taking differently: Take 2 tablets by mouth daily. ) 30 tablet 0  . sevelamer carbonate (RENVELA) 800 MG tablet Take 1,600 mg by mouth 3 (three) times daily with meals.     . sodium chloride (OCEAN) 0.65 % SOLN nasal spray Place 1 spray into both nostrils 4 (four) times daily as needed for congestion.     . traMADol (ULTRAM) 50 MG tablet Take 1 tablet (50 mg total) by mouth every 8 (eight) hours as needed. 60 tablet 0   Current Facility-Administered Medications  Medication Dose Route Frequency Provider Last Rate Last Admin  . 0.9 %  sodium chloride infusion  250 mL Intravenous PRN Waynetta Sandy, MD         PHYSICAL EXAMINATION: ECOG PERFORMANCE STATUS: 2 - Symptomatic, <50% confined to bed  Vitals:   08/04/20 1045  BP: (!) 104/53  Pulse: 78  Resp: 18  Temp: (!) 97.5 F (36.4 C)  SpO2: 95%   Filed Weights   08/04/20 1045  Weight: 123 lb 6.4 oz (56 kg)    GENERAL:alert, no distress and comfortable SKIN: skin color, texture, turgor are normal, no rashes or significant lesions EYES: normal, Conjunctiva are pink and non-injected, sclera clear OROPHARYNX:no exudate, no erythema and lips, buccal mucosa, and tongue normal  NECK: supple, thyroid normal size, non-tender, without nodularity LYMPH:  no palpable lymphadenopathy in the cervical, axillary or inguinal LUNGS: clear to auscultation and percussion with normal breathing effort HEART: regular rate & rhythm and no murmurs and no lower extremity edema ABDOMEN:abdomen soft, non-tender and normal bowel sounds Musculoskeletal:no cyanosis of digits and no clubbing  NEURO: alert & oriented x 3 with fluent speech, no focal motor/sensory deficits  LABORATORY DATA:  I have reviewed the data as listed  Component Value Date/Time   NA 138 08/04/2020 1023   K 3.4 (L) 08/04/2020 1023   CL 93 (L) 08/04/2020 1023   CO2 34 (H) 08/04/2020 1023   GLUCOSE 101 (H) 08/04/2020 1023   BUN 18 08/04/2020 1023   CREATININE 2.90 (H) 08/04/2020 1023   CREATININE 2.71 (H) 10/20/2019 1128   CREATININE 1.03 (H) 05/11/2016 0931   CALCIUM 8.4 (L) 08/04/2020 1023   PROT 5.6 (L) 08/04/2020 1023   ALBUMIN 2.9 (L) 08/04/2020 1023   AST 19 08/04/2020 1023   AST 18 10/20/2019 1128   ALT 11 08/04/2020 1023   ALT 10 10/20/2019 1128   ALKPHOS 60 08/04/2020 1023   BILITOT 0.5 08/04/2020 1023   BILITOT 0.4 10/20/2019 1128   GFRNONAA 14 (L) 08/04/2020 1023   GFRNONAA 15 (L) 10/20/2019 1128   GFRAA 14 (L) 07/14/2020 1021   GFRAA 18 (L) 10/20/2019 1128    No results found for: SPEP, UPEP  Lab Results  Component Value Date   WBC 2.9 (L) 08/04/2020    NEUTROABS 1.5 (L) 08/04/2020   HGB 9.8 (L) 08/04/2020   HCT 30.7 (L) 08/04/2020   MCV 89.8 08/04/2020   PLT 141 (L) 08/04/2020      Chemistry      Component Value Date/Time   NA 138 08/04/2020 1023   K 3.4 (L) 08/04/2020 1023   CL 93 (L) 08/04/2020 1023   CO2 34 (H) 08/04/2020 1023   BUN 18 08/04/2020 1023   CREATININE 2.90 (H) 08/04/2020 1023   CREATININE 2.71 (H) 10/20/2019 1128   CREATININE 1.03 (H) 05/11/2016 0931      Component Value Date/Time   CALCIUM 8.4 (L) 08/04/2020 1023   ALKPHOS 60 08/04/2020 1023   AST 19 08/04/2020 1023   AST 18 10/20/2019 1128   ALT 11 08/04/2020 1023   ALT 10 10/20/2019 1128   BILITOT 0.5 08/04/2020 1023   BILITOT 0.4 10/20/2019 1128

## 2020-08-04 NOTE — Assessment & Plan Note (Signed)
She will continue vitamin B12 injections with each treatment

## 2020-08-04 NOTE — Assessment & Plan Note (Signed)
She has multifactorial pancytopenia, likely worsened with recent addition of Revlimid She does not need transfusion support We will monitor closely

## 2020-08-04 NOTE — Assessment & Plan Note (Signed)
She have very good response with the addition of Revlimid recently She has virtually no side effects from therapy We will proceed with Velcade, daratumumab injection every 3 weeks and she will continue to take lenalidomide 3 times a week after dialysis Due to end-stage renal disease, she is not a candidate for bisphosphonate therapy She will continue vitamin D supplement along with calcium as managed by nephrologist

## 2020-08-06 ENCOUNTER — Ambulatory Visit (INDEPENDENT_AMBULATORY_CARE_PROVIDER_SITE_OTHER): Payer: Medicare HMO

## 2020-08-06 ENCOUNTER — Other Ambulatory Visit: Payer: Self-pay

## 2020-08-06 DIAGNOSIS — C9000 Multiple myeloma not having achieved remission: Secondary | ICD-10-CM

## 2020-08-06 DIAGNOSIS — I495 Sick sinus syndrome: Secondary | ICD-10-CM

## 2020-08-06 MED ORDER — LENALIDOMIDE 2.5 MG PO CAPS: ORAL_CAPSULE | ORAL | 0 refills | Status: DC

## 2020-08-07 ENCOUNTER — Other Ambulatory Visit: Payer: Self-pay | Admitting: Hematology and Oncology

## 2020-08-07 LAB — CUP PACEART REMOTE DEVICE CHECK
Battery Remaining Longevity: 82 mo
Battery Remaining Percentage: 95.5 %
Battery Voltage: 2.96 V
Brady Statistic AP VP Percent: 90 %
Brady Statistic AP VS Percent: 1 %
Brady Statistic AS VP Percent: 9 %
Brady Statistic AS VS Percent: 1 %
Brady Statistic RA Percent Paced: 91 %
Brady Statistic RV Percent Paced: 99 %
Date Time Interrogation Session: 20211022020013
Implantable Lead Implant Date: 20100422
Implantable Lead Implant Date: 20100422
Implantable Lead Location: 753859
Implantable Lead Location: 753860
Implantable Pulse Generator Implant Date: 20170801
Lead Channel Impedance Value: 360 Ohm
Lead Channel Impedance Value: 390 Ohm
Lead Channel Pacing Threshold Amplitude: 0.75 V
Lead Channel Pacing Threshold Amplitude: 1 V
Lead Channel Pacing Threshold Pulse Width: 0.5 ms
Lead Channel Pacing Threshold Pulse Width: 0.5 ms
Lead Channel Sensing Intrinsic Amplitude: 2.9 mV
Lead Channel Sensing Intrinsic Amplitude: 9.5 mV
Lead Channel Setting Pacing Amplitude: 2 V
Lead Channel Setting Pacing Amplitude: 2.5 V
Lead Channel Setting Pacing Pulse Width: 0.5 ms
Lead Channel Setting Sensing Sensitivity: 2 mV
Pulse Gen Model: 2272
Pulse Gen Serial Number: 7929552

## 2020-08-11 NOTE — Progress Notes (Signed)
Remote pacemaker transmission.   

## 2020-08-25 ENCOUNTER — Inpatient Hospital Stay: Payer: Medicare HMO

## 2020-08-25 ENCOUNTER — Encounter: Payer: Self-pay | Admitting: Hematology and Oncology

## 2020-08-25 ENCOUNTER — Inpatient Hospital Stay: Payer: Medicare HMO | Admitting: Hematology and Oncology

## 2020-08-25 ENCOUNTER — Other Ambulatory Visit: Payer: Self-pay

## 2020-08-25 ENCOUNTER — Inpatient Hospital Stay: Payer: Medicare HMO | Attending: Hematology and Oncology

## 2020-08-25 DIAGNOSIS — K5900 Constipation, unspecified: Secondary | ICD-10-CM | POA: Diagnosis not present

## 2020-08-25 DIAGNOSIS — N186 End stage renal disease: Secondary | ICD-10-CM | POA: Diagnosis not present

## 2020-08-25 DIAGNOSIS — G893 Neoplasm related pain (acute) (chronic): Secondary | ICD-10-CM

## 2020-08-25 DIAGNOSIS — R110 Nausea: Secondary | ICD-10-CM

## 2020-08-25 DIAGNOSIS — E538 Deficiency of other specified B group vitamins: Secondary | ICD-10-CM | POA: Insufficient documentation

## 2020-08-25 DIAGNOSIS — C9000 Multiple myeloma not having achieved remission: Secondary | ICD-10-CM

## 2020-08-25 DIAGNOSIS — Z7984 Long term (current) use of oral hypoglycemic drugs: Secondary | ICD-10-CM | POA: Diagnosis not present

## 2020-08-25 DIAGNOSIS — Z7982 Long term (current) use of aspirin: Secondary | ICD-10-CM | POA: Diagnosis not present

## 2020-08-25 DIAGNOSIS — D61818 Other pancytopenia: Secondary | ICD-10-CM | POA: Diagnosis not present

## 2020-08-25 DIAGNOSIS — Z5111 Encounter for antineoplastic chemotherapy: Secondary | ICD-10-CM | POA: Diagnosis not present

## 2020-08-25 DIAGNOSIS — Z79899 Other long term (current) drug therapy: Secondary | ICD-10-CM | POA: Diagnosis not present

## 2020-08-25 DIAGNOSIS — Z992 Dependence on renal dialysis: Secondary | ICD-10-CM

## 2020-08-25 DIAGNOSIS — K5909 Other constipation: Secondary | ICD-10-CM

## 2020-08-25 DIAGNOSIS — Z5112 Encounter for antineoplastic immunotherapy: Secondary | ICD-10-CM | POA: Diagnosis present

## 2020-08-25 DIAGNOSIS — Z7189 Other specified counseling: Secondary | ICD-10-CM

## 2020-08-25 LAB — CBC WITH DIFFERENTIAL/PLATELET
Abs Immature Granulocytes: 0.01 10*3/uL (ref 0.00–0.07)
Basophils Absolute: 0 10*3/uL (ref 0.0–0.1)
Basophils Relative: 1 %
Eosinophils Absolute: 0.2 10*3/uL (ref 0.0–0.5)
Eosinophils Relative: 5 %
HCT: 28.8 % — ABNORMAL LOW (ref 36.0–46.0)
Hemoglobin: 9.4 g/dL — ABNORMAL LOW (ref 12.0–15.0)
Immature Granulocytes: 0 %
Lymphocytes Relative: 29 %
Lymphs Abs: 1 10*3/uL (ref 0.7–4.0)
MCH: 29.9 pg (ref 26.0–34.0)
MCHC: 32.6 g/dL (ref 30.0–36.0)
MCV: 91.7 fL (ref 80.0–100.0)
Monocytes Absolute: 0.4 10*3/uL (ref 0.1–1.0)
Monocytes Relative: 13 %
Neutro Abs: 1.7 10*3/uL (ref 1.7–7.7)
Neutrophils Relative %: 52 %
Platelets: 127 10*3/uL — ABNORMAL LOW (ref 150–400)
RBC: 3.14 MIL/uL — ABNORMAL LOW (ref 3.87–5.11)
RDW: 16.7 % — ABNORMAL HIGH (ref 11.5–15.5)
WBC: 3.3 10*3/uL — ABNORMAL LOW (ref 4.0–10.5)
nRBC: 0 % (ref 0.0–0.2)

## 2020-08-25 LAB — COMPREHENSIVE METABOLIC PANEL
ALT: 8 U/L (ref 0–44)
AST: 18 U/L (ref 15–41)
Albumin: 3.2 g/dL — ABNORMAL LOW (ref 3.5–5.0)
Alkaline Phosphatase: 68 U/L (ref 38–126)
Anion gap: 7 (ref 5–15)
BUN: 19 mg/dL (ref 8–23)
CO2: 36 mmol/L — ABNORMAL HIGH (ref 22–32)
Calcium: 8.3 mg/dL — ABNORMAL LOW (ref 8.9–10.3)
Chloride: 97 mmol/L — ABNORMAL LOW (ref 98–111)
Creatinine, Ser: 2.99 mg/dL — ABNORMAL HIGH (ref 0.44–1.00)
GFR, Estimated: 15 mL/min — ABNORMAL LOW (ref >60)
Glucose, Bld: 93 mg/dL (ref 70–99)
Potassium: 3.7 mmol/L (ref 3.5–5.1)
Sodium: 140 mmol/L (ref 135–145)
Total Bilirubin: 0.9 mg/dL (ref 0.3–1.2)
Total Protein: 5.8 g/dL — ABNORMAL LOW (ref 6.5–8.1)

## 2020-08-25 MED ORDER — DEXAMETHASONE 4 MG PO TABS
ORAL_TABLET | ORAL | Status: AC
  Filled 2020-08-25: qty 1

## 2020-08-25 MED ORDER — ACETAMINOPHEN 325 MG PO TABS
650.0000 mg | ORAL_TABLET | Freq: Once | ORAL | Status: AC
  Administered 2020-08-25: 650 mg via ORAL

## 2020-08-25 MED ORDER — DEXAMETHASONE 4 MG PO TABS
ORAL_TABLET | ORAL | Status: AC
  Filled 2020-08-25: qty 2

## 2020-08-25 MED ORDER — DIPHENHYDRAMINE HCL 25 MG PO CAPS
25.0000 mg | ORAL_CAPSULE | Freq: Once | ORAL | Status: AC
  Administered 2020-08-25: 25 mg via ORAL

## 2020-08-25 MED ORDER — BORTEZOMIB CHEMO SQ INJECTION 3.5 MG (2.5MG/ML)
0.7800 mg/m2 | Freq: Once | INTRAMUSCULAR | Status: AC
  Administered 2020-08-25: 1.25 mg via SUBCUTANEOUS
  Filled 2020-08-25: qty 0.5

## 2020-08-25 MED ORDER — DEXAMETHASONE 4 MG PO TABS
8.0000 mg | ORAL_TABLET | Freq: Once | ORAL | Status: AC
  Administered 2020-08-25: 8 mg via ORAL

## 2020-08-25 MED ORDER — DARATUMUMAB-HYALURONIDASE-FIHJ 1800-30000 MG-UT/15ML ~~LOC~~ SOLN
1800.0000 mg | Freq: Once | SUBCUTANEOUS | Status: AC
  Administered 2020-08-25: 1800 mg via SUBCUTANEOUS
  Filled 2020-08-25: qty 15

## 2020-08-25 MED ORDER — CYANOCOBALAMIN 1000 MCG/ML IJ SOLN
1000.0000 ug | Freq: Once | INTRAMUSCULAR | Status: AC
  Administered 2020-08-25: 1000 ug via INTRAMUSCULAR

## 2020-08-25 MED ORDER — CYANOCOBALAMIN 1000 MCG/ML IJ SOLN
INTRAMUSCULAR | Status: AC
  Filled 2020-08-25: qty 1

## 2020-08-25 MED ORDER — PROCHLORPERAZINE MALEATE 10 MG PO TABS
10.0000 mg | ORAL_TABLET | Freq: Once | ORAL | Status: AC
  Administered 2020-08-25: 10 mg via ORAL

## 2020-08-25 MED ORDER — DIPHENHYDRAMINE HCL 25 MG PO CAPS
ORAL_CAPSULE | ORAL | Status: AC
  Filled 2020-08-25: qty 1

## 2020-08-25 MED ORDER — TRAMADOL HCL 50 MG PO TABS
100.0000 mg | ORAL_TABLET | Freq: Four times a day (QID) | ORAL | 0 refills | Status: DC | PRN
Start: 2020-08-25 — End: 2020-10-27

## 2020-08-25 MED ORDER — ACETAMINOPHEN 325 MG PO TABS
ORAL_TABLET | ORAL | Status: AC
  Filled 2020-08-25: qty 2

## 2020-08-25 MED ORDER — PROCHLORPERAZINE MALEATE 10 MG PO TABS
ORAL_TABLET | ORAL | Status: AC
  Filled 2020-08-25: qty 1

## 2020-08-25 NOTE — Progress Notes (Signed)
[place] OFFICE PROGRESS NOTE  Patient Care Team: Lucianne Lei, MD as PCP - General (Family Medicine) Thompson Grayer, MD as PCP - Electrophysiology (Cardiology) Josue Hector, MD as PCP - Cardiology (Cardiology)  ASSESSMENT & PLAN:  Multiple myeloma not having achieved remission [facility]) She have very good response with the addition of Revlimid recently She has virtually no side effects from therapy We will proceed with Velcade, daratumumab injection every 3 weeks and she will continue to take lenalidomide 3 times a week after dialysis Due to end-stage renal disease, she is not a candidate for bisphosphonate therapy She will continue vitamin D supplement along with calcium as managed by nephrologist I recommend her to increase the dose of vitamin D supplement  Pancytopenia, acquired ([place]) She has multifactorial pancytopenia, likely worsened with recent addition of Revlimid She does not need transfusion support We will monitor closely  Vitamin B12 deficiency She will continue vitamin B12 injections with each treatment  ESRD (end stage renal disease) on dialysis ([place]) We discussed the limitations of treatment options due to her end-stage renal disease She will continue hemodialysis  Cancer associated pain She has multifactorial bone pain She is not a candidate for aggressive management given her age and comorbidities I recommend increased dose of vitamin D supplement We discussed the use of over-the-counter acetaminophen in addition to tramadol or oxycodone for severe bone pain After much discussion, she is in agreement to have the dose of tramadol increased to 100 mg each time as needed and to reserve to use oxycodone for severe pain only We discussed the risk of constipation and nausea with pain medicine We discussed narcotic refill policy  Other constipation We discussed the importance of aggressive laxative therapy while on chronic pain medicine  Nausea in  adult She has multifactorial intermittent nausea, could be exacerbated by side effects of pain medicine and constipation I recommend she use Zofran or Compazine at different times of the day We discussed side effects to be expected with each medication   No orders of the defined types were placed in this encounter.   All questions were answered. The patient knows to call the clinic with any problems, questions or concerns. The total time spent in the appointment was 30 minutes encounter with patients including review of chart and various tests results, discussions about plan of care and coordination of care plan   Heath Lark, MD 08/25/2020 11:00 AM  INTERVAL HISTORY: Please see below for problem oriented charting. She returns for further follow-up with her son She tolerated chemotherapy well except for mild intermittent nausea and persistent hip pain She is taking tramadol predominantly for pain control and is avoiding oxycodone because of risk of severe constipation Her appetite is fair She tolerated hemodialysis well No recent infection, fever or chills Denies infusion reactions  SUMMARY OF ONCOLOGIC HISTORY: Oncology History  Multiple myeloma not having achieved remission ([place])  03/03/2019 Initial Diagnosis   Multiple myeloma not having achieved remission ([place])   03/03/2019 Imaging   1. Round and oval lucent/lytic lesions in the skull, bilateral humeri, distal left femur, and possibly also in the pelvis are compatible with multiple myeloma. No right lower extremity lytic lesion identified. No pathologic fracture. 2. Advanced degenerative changes in the spine with no vertebral myeloma evident by plain radiography. 3. Advanced degenerative changes in the right upper extremity. Bilateral knee arthroplasty. 4.  Aortic Atherosclerosis (ICD10-I70.0).   03/04/2019 -  Chemotherapy   The patient had bortezomib for chemotherapy treatment.  03/04/2019 Bone Marrow Biopsy   Bone Marrow,  Aspirate,Biopsy, and Clot - HYPERCELLULAR BONE MARROW FOR AGE WITH PLASMACYTOSIS. - SEE COMMENT. PERIPHERAL BLOOD: - NORMOCYTIC-NORMOCHROMIC ANEMIA. Diagnosis Note The bone marrow is hypercellular for age with increased number of atypical plasma cells averaging 30% of all cells in the aspirate associated with interstitial infiltrates and numerous variably sized aggregates in the clot and biopsy sections. 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 Confirmatory in situ hybridization for kappa and lambda as well as CD138 will be performed and the results reported in an addendum. The background shows trilineage hematopoiesis with mild non-specific changes primarily involving the erythroid series. Correlation with cytogenetic and FISH studies is recommended.    06/20/2019 -  Chemotherapy   The patient had dexamethasone (DECADRON) tablet 20 mg, 20 mg (100 % of original dose 20 mg), Oral,  Once, 18 of 21 cycles Dose modification: 20 mg (original dose 20 mg, Cycle 1), 8 mg (original dose 8 mg, Cycle 10) Administration: 20 mg (06/20/2019), 20 mg (06/27/2019), 20 mg (07/04/2019), 20 mg (07/11/2019), 20 mg (07/18/2019), 20 mg (07/25/2019), 20 mg (08/08/2019), 20 mg (08/15/2019), 20 mg (09/05/2019), 20 mg (12/01/2019), 20 mg (09/26/2019), 20 mg (10/20/2019), 20 mg (11/10/2019), 20 mg (12/22/2019), 20 mg (01/12/2020), 8 mg (02/02/2020), 8 mg (02/23/2020), 8 mg (03/22/2020), 8 mg (04/12/2020), 8 mg (05/03/2020), 8 mg (05/31/2020), 8 mg (06/23/2020), 8 mg (07/14/2020), 8 mg (08/04/2020) daratumumab-hyaluronidase-fihj (DARZALEX FASPRO) 1800-30000 MG-UT/15ML chemo SQ injection 1,800 mg, 1,800 mg, Subcutaneous,  Once, 18 of 21 cycles Administration: 1,800 mg (06/20/2019), 1,800 mg (06/27/2019), 1,800 mg (07/04/2019), 1,800 mg (07/11/2019), 1,800 mg (07/18/2019), 1,800 mg (07/25/2019), 1,800 mg (08/08/2019), 1,800 mg (08/15/2019), 1,800 mg (09/05/2019), 1,800 mg (12/01/2019), 1,800 mg (02/02/2020), 1,800 mg (09/26/2019), 1,800 mg  (10/20/2019), 1,800 mg (11/10/2019), 1,800 mg (12/22/2019), 1,800 mg (01/12/2020), 1,800 mg (02/23/2020), 1,800 mg (03/22/2020), 1,800 mg (04/12/2020), 1,800 mg (05/03/2020), 1,800 mg (05/31/2020), 1,800 mg (06/23/2020), 1,800 mg (07/14/2020), 1,800 mg (08/04/2020)  for chemotherapy treatment.      REVIEW OF SYSTEMS:   Constitutional: Denies fevers, chills or abnormal weight loss Eyes: Denies blurriness of vision Ears, nose, mouth, throat, and face: Denies mucositis or sore throat Respiratory: Denies cough, dyspnea or wheezes Cardiovascular: Denies palpitation, chest discomfort or lower extremity swelling Skin: Denies abnormal skin rashes Lymphatics: Denies new lymphadenopathy or easy bruising Neurological:Denies numbness, tingling or new weaknesses Behavioral/Psych: Mood is stable, no new changes  All other systems were reviewed with the patient and are negative.  I have reviewed the past medical history, past surgical history, social history and family history with the patient and they are unchanged from previous note.  ALLERGIES:  is allergic to aspirin, hydrocodone-acetaminophen, morphine, penicillins, betaine, dacarbazine, ketorolac tromethamine, brimonidine, diltiazem hcl, hydrocodone-acetaminophen, neurontin [gabapentin], and sulfacetamide sodium.  MEDICATIONS:  Current Outpatient Medications  Medication Sig Dispense Refill  . cholecalciferol (VITAMIN D3) 25 MCG (1000 UT) tablet Take 2,000 Units by mouth daily.    Marland Kitchen acetaminophen (TYLENOL) 500 MG tablet Take 1,000 mg by mouth 2 (two) times daily as needed for mild pain or moderate pain.     Marland Kitchen acyclovir (ZOVIRAX) 400 MG tablet Take 1 tablet (400 mg total) by mouth daily. 90 tablet 6  . allopurinol (ZYLOPRIM) 100 MG tablet Take 0.5 tablets (50 mg total) by mouth daily. (Patient taking differently: Take 50 mg by mouth at bedtime. ) 30 tablet 0  . aspirin EC 81 MG tablet Take 81 mg by mouth daily. Swallow whole.    Marland Kitchen  B Complex-C-Zn-Folic Acid  (DIALYVITE 800-ZINC 15) 0.8 MG TABS Take 1 tablet by mouth every evening.     . dorzolamide (TRUSOPT) 2 % ophthalmic solution Place 1 drop into both eyes 2 (two) times daily.   1  . fluorometholone (FML) 0.1 % ophthalmic suspension Place 1 drop into the right eye daily.     Marland Kitchen glimepiride (AMARYL) 1 MG tablet Take 0.5 mg by mouth at bedtime.     Marland Kitchen lactulose (CHRONULAC) 10 GM/15ML solution TAKE 15 MLS (10 G TOTAL) BY MOUTH DAILY AS NEEDED FOR MILD CONSTIPATION. 473 mL 0  . lenalidomide (REVLIMID) 2.5 MG capsule Take 1 capsule three times a week after dialysis on Tuesday, Thursday and Saturdays 12 capsule 0  . lidocaine-prilocaine (EMLA) cream Apply 1 application topically as needed [facility]).     Marland Kitchen LUMIGAN 0.01 % SOLN Place 1 drop into the left eye at bedtime.     Marland Kitchen LYRICA 50 MG capsule Take 50 mg by mouth at bedtime.     . ondansetron (ZOFRAN) 4 MG tablet TAKE 1 TABLET BY MOUTH EVERY 8 HOURS AS NEEDED FOR NAUSEA 30 tablet 1  . oxyCODONE (OXY IR/ROXICODONE) 5 MG immediate release tablet Take 1 tablet (5 mg total) by mouth every 4 (four) hours as needed for severe pain. 60 tablet 0  . pantoprazole (PROTONIX) 40 MG tablet Take 1 tablet (40 mg total) by mouth daily as needed (acid reflux/indigestion.). 30 tablet 11  . polyethylene glycol (MIRALAX / GLYCOLAX) 17 g packet Take 17 g by mouth daily.     . prochlorperazine (COMPAZINE) 5 MG tablet TAKE 1 TABLET (5 MG TOTAL) BY MOUTH EVERY 6 (SIX) HOURS AS NEEDED FOR NAUSEA OR VOMITING. 30 tablet 1  . senna-docusate (SENOKOT-S) 8.6-50 MG tablet Take 2 tablets by mouth 2 (two) times daily. (Patient taking differently: Take 2 tablets by mouth daily. ) 30 tablet 0  . sevelamer carbonate (RENVELA) 800 MG tablet Take 1,600 mg by mouth 3 (three) times daily with meals.     . sodium chloride (OCEAN) 0.65 % SOLN nasal spray Place 1 spray into both nostrils 4 (four) times daily as needed for congestion.     . traMADol (ULTRAM) 50 MG tablet Take 2 tablets (100 mg total) by  mouth every 6 (six) hours as needed. 60 tablet 0   Current Facility-Administered Medications  Medication Dose Route Frequency Provider Last Rate Last Admin  . 0.9 %  sodium chloride infusion  250 mL Intravenous PRN Waynetta Sandy, MD        PHYSICAL EXAMINATION: ECOG PERFORMANCE STATUS: 2 - Symptomatic, <50% confined to bed  Vitals:   08/25/20 1046  BP: (!) 118/48  Pulse: 72  Resp: 18  Temp: 97.9 F (36.6 C)  SpO2: 99%   Filed Weights   08/25/20 1046  Weight: 121 lb 6.4 oz (55.1 kg)    GENERAL:alert, no distress and comfortable SKIN: skin color, texture, turgor are normal, no rashes or significant lesions EYES: normal, Conjunctiva are pink and non-injected, sclera clear OROPHARYNX:no exudate, no erythema and lips, buccal mucosa, and tongue normal  NECK: supple, thyroid normal size, non-tender, without nodularity LYMPH:  no palpable lymphadenopathy in the cervical, axillary or inguinal LUNGS: clear to auscultation and percussion with normal breathing effort HEART: regular rate & rhythm and no murmurs and no lower extremity edema ABDOMEN:abdomen soft, non-tender and normal bowel sounds Musculoskeletal:no cyanosis of digits and no clubbing  NEURO: alert & oriented x 3 with fluent speech, no focal  motor/sensory deficits  LABORATORY DATA:  I have reviewed the data as listed    Component Value Date/Time   NA 138 08/04/2020 1023   K 3.4 (L) 08/04/2020 1023   CL 93 (L) 08/04/2020 1023   CO2 34 (H) 08/04/2020 1023   GLUCOSE 101 (H) 08/04/2020 1023   BUN 18 08/04/2020 1023   CREATININE 2.90 (H) 08/04/2020 1023   CREATININE 2.71 (H) 10/20/2019 1128   CREATININE 1.03 (H) 05/11/2016 0931   CALCIUM 8.4 (L) 08/04/2020 1023   PROT 5.6 (L) 08/04/2020 1023   ALBUMIN 2.9 (L) 08/04/2020 1023   AST 19 08/04/2020 1023   AST 18 10/20/2019 1128   ALT 11 08/04/2020 1023   ALT 10 10/20/2019 1128   ALKPHOS 60 08/04/2020 1023   BILITOT 0.5 08/04/2020 1023   BILITOT 0.4  10/20/2019 1128   GFRNONAA 14 (L) 08/04/2020 1023   GFRNONAA 15 (L) 10/20/2019 1128   GFRAA 14 (L) 07/14/2020 1021   GFRAA 18 (L) 10/20/2019 1128    No results found for: SPEP, UPEP  Lab Results  Component Value Date   WBC 3.3 (L) 08/25/2020   NEUTROABS 1.7 08/25/2020   HGB 9.4 (L) 08/25/2020   HCT 28.8 (L) 08/25/2020   MCV 91.7 08/25/2020   PLT 127 (L) 08/25/2020      Chemistry      Component Value Date/Time   NA 138 08/04/2020 1023   K 3.4 (L) 08/04/2020 1023   CL 93 (L) 08/04/2020 1023   CO2 34 (H) 08/04/2020 1023   BUN 18 08/04/2020 1023   CREATININE 2.90 (H) 08/04/2020 1023   CREATININE 2.71 (H) 10/20/2019 1128   CREATININE 1.03 (H) 05/11/2016 0931      Component Value Date/Time   CALCIUM 8.4 (L) 08/04/2020 1023   ALKPHOS 60 08/04/2020 1023   AST 19 08/04/2020 1023   AST 18 10/20/2019 1128   ALT 11 08/04/2020 1023   ALT 10 10/20/2019 1128   BILITOT 0.5 08/04/2020 1023   BILITOT 0.4 10/20/2019 1128

## 2020-08-25 NOTE — Assessment & Plan Note (Signed)
We discussed the limitations of treatment options due to her end-stage renal disease °She will continue hemodialysis °

## 2020-08-25 NOTE — Assessment & Plan Note (Signed)
We discussed the importance of aggressive laxative therapy while on chronic pain medicine 

## 2020-08-25 NOTE — Assessment & Plan Note (Signed)
She has multifactorial bone pain She is not a candidate for aggressive management given her age and comorbidities I recommend increased dose of vitamin D supplement We discussed the use of over-the-counter acetaminophen in addition to tramadol or oxycodone for severe bone pain After much discussion, she is in agreement to have the dose of tramadol increased to 100 mg each time as needed and to reserve to use oxycodone for severe pain only We discussed the risk of constipation and nausea with pain medicine We discussed narcotic refill policy

## 2020-08-25 NOTE — Assessment & Plan Note (Signed)
She will continue vitamin B12 injections with each treatment

## 2020-08-25 NOTE — Assessment & Plan Note (Signed)
She have very good response with the addition of Revlimid recently She has virtually no side effects from therapy We will proceed with Velcade, daratumumab injection every 3 weeks and she will continue to take lenalidomide 3 times a week after dialysis Due to end-stage renal disease, she is not a candidate for bisphosphonate therapy She will continue vitamin D supplement along with calcium as managed by nephrologist I recommend her to increase the dose of vitamin D supplement

## 2020-08-25 NOTE — Assessment & Plan Note (Signed)
She has multifactorial pancytopenia, likely worsened with recent addition of Revlimid She does not need transfusion support We will monitor closely

## 2020-08-25 NOTE — Patient Instructions (Signed)
[place] Discharge Instructions for Patients Receiving Chemotherapy  Today you received the following chemotherapy agents: Darzalex Faspro/Velcade.  To help prevent nausea and vomiting after your treatment, we encourage you to take your nausea medication as directed.   If you develop nausea and vomiting that is not controlled by your nausea medication, call the clinic.   BELOW ARE SYMPTOMS THAT SHOULD BE REPORTED IMMEDIATELY:  *FEVER GREATER THAN 100.5 F  *CHILLS WITH OR WITHOUT FEVER  NAUSEA AND VOMITING THAT IS NOT CONTROLLED WITH YOUR NAUSEA MEDICATION  *UNUSUAL SHORTNESS OF BREATH  *UNUSUAL BRUISING OR BLEEDING  TENDERNESS IN MOUTH AND THROAT WITH OR WITHOUT PRESENCE OF ULCERS  *URINARY PROBLEMS  *BOWEL PROBLEMS  UNUSUAL RASH Items with * indicate a potential emergency and should be followed up as soon as possible.  Feel free to call the clinic should you have any questions or concerns. The clinic phone number is [phone].  Please show the [place] at check-in to the Emergency Department and triage nurse.  Cyanocobalamin, Vitamin B12 injection What is this medicine? CYANOCOBALAMIN (sye an oh koe BAL a min) is a man made form of vitamin B12. Vitamin B12 is used in the growth of healthy blood cells, nerve cells, and proteins in the body. It also helps with the metabolism of fats and carbohydrates. This medicine is used to treat people who can not absorb vitamin B12. This medicine may be used for other purposes; ask your health care provider or pharmacist if you have questions. COMMON BRAND NAME(S): B-12 Compliance Kit, B-12 Injection Kit, Cyomin, LA-12, Nutri-Twelve, Physicians EZ Use B-12, Primabalt What should I tell my health care provider before I take this medicine? They need to know if you have any of these conditions:  kidney disease  Leber's disease  megaloblastic anemia  an unusual or allergic reaction to cyanocobalamin,  cobalt, other medicines, foods, dyes, or preservatives  pregnant or trying to get pregnant  breast-feeding How should I use this medicine? This medicine is injected into a muscle or deeply under the skin. It is usually given by a health care professional in a clinic or doctor's office. However, your doctor may teach you how to inject yourself. Follow all instructions. Talk to your pediatrician regarding the use of this medicine in children. Special care may be needed. Overdosage: If you think you have taken too much of this medicine contact a poison control center or emergency room at once. NOTE: This medicine is only for you. Do not share this medicine with others. What if I miss a dose? If you are given your dose at a clinic or doctor's office, call to reschedule your appointment. If you give your own injections and you miss a dose, take it as soon as you can. If it is almost time for your next dose, take only that dose. Do not take double or extra doses. What may interact with this medicine?  colchicine  heavy alcohol intake This list may not describe all possible interactions. Give your health care provider a list of all the medicines, herbs, non-prescription drugs, or dietary supplements you use. Also tell them if you smoke, drink alcohol, or use illegal drugs. Some items may interact with your medicine. What should I watch for while using this medicine? Visit your doctor or health care professional regularly. You may need blood work done while you are taking this medicine. You may need to follow a special diet. Talk to your doctor. Limit your alcohol intake  and avoid smoking to get the best benefit. What side effects may I notice from receiving this medicine? Side effects that you should report to your doctor or health care professional as soon as possible:  allergic reactions like skin rash, itching or hives, swelling of the face, lips, or tongue  blue tint to skin  chest  tightness, pain  difficulty breathing, wheezing  dizziness  red, swollen painful area on the leg Side effects that usually do not require medical attention (report to your doctor or health care professional if they continue or are bothersome):  diarrhea  headache This list may not describe all possible side effects. Call your doctor for medical advice about side effects. You may report side effects to FDA at 1-800-FDA-1088. Where should I keep my medicine? Keep out of the reach of children. Store at room temperature between 15 and 30 degrees C (59 and 85 degrees F). Protect from light. Throw away any unused medicine after the expiration date. NOTE: This sheet is a summary. It may not cover all possible information. If you have questions about this medicine, talk to your doctor, pharmacist, or health care provider.  2020 Elsevier/Gold Standard (2008-01-13 22:10:20)  

## 2020-08-25 NOTE — Assessment & Plan Note (Signed)
She has multifactorial intermittent nausea, could be exacerbated by side effects of pain medicine and constipation I recommend she use Zofran or Compazine at different times of the day We discussed side effects to be expected with each medication

## 2020-08-26 LAB — KAPPA/LAMBDA LIGHT CHAINS
Kappa free light chain: 62.3 mg/L — ABNORMAL HIGH (ref 3.3–19.4)
Kappa, lambda light chain ratio: 2.83 — ABNORMAL HIGH (ref 0.26–1.65)
Lambda free light chains: 22 mg/L (ref 5.7–26.3)

## 2020-08-27 LAB — MULTIPLE MYELOMA PANEL, SERUM
Albumin SerPl Elph-Mcnc: 3.1 g/dL (ref 2.9–4.4)
Albumin/Glob SerPl: 1.5 (ref 0.7–1.7)
Alpha 1: 0.3 g/dL (ref 0.0–0.4)
Alpha2 Glob SerPl Elph-Mcnc: 0.6 g/dL (ref 0.4–1.0)
B-Globulin SerPl Elph-Mcnc: 1 g/dL (ref 0.7–1.3)
Gamma Glob SerPl Elph-Mcnc: 0.3 g/dL — ABNORMAL LOW (ref 0.4–1.8)
Globulin, Total: 2.2 g/dL (ref 2.2–3.9)
IgA: 25 mg/dL — ABNORMAL LOW (ref 64–422)
IgG (Immunoglobin G), Serum: 769 mg/dL (ref 586–1602)
IgM (Immunoglobulin M), Srm: 11 mg/dL — ABNORMAL LOW (ref 26–217)
M Protein SerPl Elph-Mcnc: 0.5 g/dL — ABNORMAL HIGH
Total Protein ELP: 5.3 g/dL — ABNORMAL LOW (ref 6.0–8.5)

## 2020-08-28 ENCOUNTER — Other Ambulatory Visit: Payer: Self-pay | Admitting: Hematology and Oncology

## 2020-09-01 ENCOUNTER — Other Ambulatory Visit: Payer: Self-pay

## 2020-09-01 ENCOUNTER — Ambulatory Visit: Payer: Medicare HMO | Admitting: Podiatry

## 2020-09-01 ENCOUNTER — Encounter: Payer: Self-pay | Admitting: Podiatry

## 2020-09-01 DIAGNOSIS — B351 Tinea unguium: Secondary | ICD-10-CM

## 2020-09-01 DIAGNOSIS — M79675 Pain in left toe(s): Secondary | ICD-10-CM

## 2020-09-01 DIAGNOSIS — M79674 Pain in right toe(s): Secondary | ICD-10-CM | POA: Diagnosis not present

## 2020-09-01 DIAGNOSIS — D689 Coagulation defect, unspecified: Secondary | ICD-10-CM

## 2020-09-03 ENCOUNTER — Ambulatory Visit: Payer: Medicare HMO

## 2020-09-03 ENCOUNTER — Other Ambulatory Visit: Payer: Self-pay

## 2020-09-03 ENCOUNTER — Ambulatory Visit: Payer: Medicare HMO | Admitting: Hematology and Oncology

## 2020-09-03 ENCOUNTER — Other Ambulatory Visit: Payer: Medicare HMO

## 2020-09-03 ENCOUNTER — Encounter: Payer: Self-pay | Admitting: Podiatry

## 2020-09-03 DIAGNOSIS — C9000 Multiple myeloma not having achieved remission: Secondary | ICD-10-CM

## 2020-09-03 MED ORDER — LENALIDOMIDE 2.5 MG PO CAPS: ORAL_CAPSULE | ORAL | 0 refills | Status: DC

## 2020-09-03 NOTE — Progress Notes (Signed)
°  Subjective:  Patient ID: [name], female    DOB: 02-Aug-1930,  MRN: [id number]  Chief Complaint  Patient presents with   Nail Problem    thick painful toenails   Diabetes   86 y.o. female returns for the above complaint.  Patient presents with thickened elongated dystrophic painful toenails x10.  They are painful to walk on.  Patient states that she is not able to debride debrided outside.  Patient has had a history of having diabetes however now she is on diet control with last A1c of 4.7.  She denies any other acute complaint she would like for me to debride them.  She has not seen anyone else prior to seeing me.  Objective:  There were no vitals filed for this visit. Podiatric Exam: Vascular: dorsalis pedis and posterior tibial pulses are palpable bilateral. Capillary return is immediate. Temperature gradient is WNL. Skin turgor WNL  Sensorium: Normal Semmes Weinstein monofilament test. Normal tactile sensation bilaterally. Nail Exam: Pt has thick disfigured discolored nails with subungual debris noted bilateral entire nail hallux through fifth toenails.  Pain on palpation to the nails. Ulcer Exam: There is no evidence of ulcer or pre-ulcerative changes or infection. Orthopedic Exam: Muscle tone and strength are WNL. No limitations in general ROM. No crepitus or effusions noted. HAV  B/L.  Hammer toes 2-5  B/L. Skin: No Porokeratosis. No infection or ulcers    Assessment & Plan:  No diagnosis found.  Patient was evaluated and treated and all questions answered.  Onychomycosis with pain  -Nails palliatively debrided as below. -Educated on self-care  Procedure: Nail Debridement Rationale: pain  Type of Debridement: manual, sharp debridement. Instrumentation: Nail nipper, rotary burr. Number of Nails: 10  Procedures and Treatment: Consent by patient was obtained for treatment procedures. The patient understood the discussion of treatment and procedures well. All  questions were answered thoroughly reviewed. Debridement of mycotic and hypertrophic toenails, 1 through 5 bilateral and clearing of subungual debris. No ulceration, no infection noted.  Return Visit-Office Procedure: Patient instructed to return to the office for a follow up visit 3 months for continued evaluation and treatment.  Boneta Lucks, DPM    No follow-ups on file.

## 2020-09-08 ENCOUNTER — Encounter: Payer: Self-pay | Admitting: Hematology and Oncology

## 2020-09-08 ENCOUNTER — Ambulatory Visit: Payer: Medicare HMO | Admitting: Podiatry

## 2020-09-10 ENCOUNTER — Emergency Department (HOSPITAL_COMMUNITY): Payer: Medicare HMO

## 2020-09-10 ENCOUNTER — Encounter (HOSPITAL_COMMUNITY): Payer: Self-pay

## 2020-09-10 ENCOUNTER — Inpatient Hospital Stay (HOSPITAL_COMMUNITY)
Admission: EM | Admit: 2020-09-10 | Discharge: 2020-09-12 | DRG: 640 | Disposition: A | Discharge status: Home / Self Care | Payer: Medicare HMO | Attending: Family Medicine | Admitting: Family Medicine

## 2020-09-10 DIAGNOSIS — R1013 Epigastric pain: Secondary | ICD-10-CM | POA: Diagnosis not present

## 2020-09-10 DIAGNOSIS — J810 Acute pulmonary edema: Secondary | ICD-10-CM | POA: Diagnosis not present

## 2020-09-10 DIAGNOSIS — K219 Gastro-esophageal reflux disease without esophagitis: Secondary | ICD-10-CM | POA: Diagnosis present

## 2020-09-10 DIAGNOSIS — N2581 Secondary hyperparathyroidism of renal origin: Secondary | ICD-10-CM | POA: Diagnosis present

## 2020-09-10 DIAGNOSIS — G620 Drug-induced polyneuropathy: Secondary | ICD-10-CM | POA: Diagnosis present

## 2020-09-10 DIAGNOSIS — I132 Hypertensive heart and chronic kidney disease with heart failure and with stage 5 chronic kidney disease, or end stage renal disease: Secondary | ICD-10-CM | POA: Diagnosis present

## 2020-09-10 DIAGNOSIS — Z886 Allergy status to analgesic agent status: Secondary | ICD-10-CM

## 2020-09-10 DIAGNOSIS — H409 Unspecified glaucoma: Secondary | ICD-10-CM | POA: Diagnosis present

## 2020-09-10 DIAGNOSIS — E43 Unspecified severe protein-calorie malnutrition: Secondary | ICD-10-CM | POA: Diagnosis present

## 2020-09-10 DIAGNOSIS — E1152 Type 2 diabetes mellitus with diabetic peripheral angiopathy with gangrene: Secondary | ICD-10-CM | POA: Diagnosis present

## 2020-09-10 DIAGNOSIS — D61818 Other pancytopenia: Secondary | ICD-10-CM | POA: Diagnosis not present

## 2020-09-10 DIAGNOSIS — I495 Sick sinus syndrome: Secondary | ICD-10-CM | POA: Diagnosis present

## 2020-09-10 DIAGNOSIS — C9000 Multiple myeloma not having achieved remission: Secondary | ICD-10-CM | POA: Diagnosis not present

## 2020-09-10 DIAGNOSIS — R634 Abnormal weight loss: Secondary | ICD-10-CM | POA: Diagnosis present

## 2020-09-10 DIAGNOSIS — J811 Chronic pulmonary edema: Secondary | ICD-10-CM | POA: Diagnosis present

## 2020-09-10 DIAGNOSIS — N186 End stage renal disease: Secondary | ICD-10-CM | POA: Diagnosis present

## 2020-09-10 DIAGNOSIS — Z807 Family history of other malignant neoplasms of lymphoid, hematopoietic and related tissues: Secondary | ICD-10-CM

## 2020-09-10 DIAGNOSIS — E8779 Other fluid overload: Principal | ICD-10-CM | POA: Diagnosis present

## 2020-09-10 DIAGNOSIS — Z681 Body mass index (BMI) 19 or less, adult: Secondary | ICD-10-CM

## 2020-09-10 DIAGNOSIS — T451X5A Adverse effect of antineoplastic and immunosuppressive drugs, initial encounter: Secondary | ICD-10-CM | POA: Diagnosis present

## 2020-09-10 DIAGNOSIS — Z7984 Long term (current) use of oral hypoglycemic drugs: Secondary | ICD-10-CM

## 2020-09-10 DIAGNOSIS — I5033 Acute on chronic diastolic (congestive) heart failure: Secondary | ICD-10-CM | POA: Diagnosis present

## 2020-09-10 DIAGNOSIS — J9811 Atelectasis: Secondary | ICD-10-CM | POA: Diagnosis present

## 2020-09-10 DIAGNOSIS — Z885 Allergy status to narcotic agent status: Secondary | ICD-10-CM

## 2020-09-10 DIAGNOSIS — Z888 Allergy status to other drugs, medicaments and biological substances status: Secondary | ICD-10-CM

## 2020-09-10 DIAGNOSIS — Z8249 Family history of ischemic heart disease and other diseases of the circulatory system: Secondary | ICD-10-CM

## 2020-09-10 DIAGNOSIS — L89151 Pressure ulcer of sacral region, stage 1: Secondary | ICD-10-CM | POA: Diagnosis present

## 2020-09-10 DIAGNOSIS — Z992 Dependence on renal dialysis: Secondary | ICD-10-CM

## 2020-09-10 DIAGNOSIS — G4733 Obstructive sleep apnea (adult) (pediatric): Secondary | ICD-10-CM | POA: Diagnosis present

## 2020-09-10 DIAGNOSIS — Z20822 Contact with and (suspected) exposure to covid-19: Secondary | ICD-10-CM | POA: Diagnosis present

## 2020-09-10 DIAGNOSIS — Z880 Allergy status to penicillin: Secondary | ICD-10-CM

## 2020-09-10 DIAGNOSIS — R54 Age-related physical debility: Secondary | ICD-10-CM | POA: Diagnosis present

## 2020-09-10 DIAGNOSIS — Z90710 Acquired absence of both cervix and uterus: Secondary | ICD-10-CM

## 2020-09-10 DIAGNOSIS — G4761 Periodic limb movement disorder: Secondary | ICD-10-CM | POA: Diagnosis present

## 2020-09-10 DIAGNOSIS — R778 Other specified abnormalities of plasma proteins: Secondary | ICD-10-CM | POA: Diagnosis present

## 2020-09-10 DIAGNOSIS — D631 Anemia in chronic kidney disease: Secondary | ICD-10-CM | POA: Diagnosis present

## 2020-09-10 DIAGNOSIS — Z79891 Long term (current) use of opiate analgesic: Secondary | ICD-10-CM

## 2020-09-10 DIAGNOSIS — Z79899 Other long term (current) drug therapy: Secondary | ICD-10-CM

## 2020-09-10 DIAGNOSIS — R7989 Other specified abnormal findings of blood chemistry: Secondary | ICD-10-CM

## 2020-09-10 DIAGNOSIS — M25559 Pain in unspecified hip: Secondary | ICD-10-CM | POA: Diagnosis not present

## 2020-09-10 DIAGNOSIS — Z882 Allergy status to sulfonamides status: Secondary | ICD-10-CM

## 2020-09-10 DIAGNOSIS — M109 Gout, unspecified: Secondary | ICD-10-CM | POA: Diagnosis present

## 2020-09-10 DIAGNOSIS — J90 Pleural effusion, not elsewhere classified: Secondary | ICD-10-CM | POA: Diagnosis present

## 2020-09-10 DIAGNOSIS — R0600 Dyspnea, unspecified: Secondary | ICD-10-CM

## 2020-09-10 DIAGNOSIS — E1122 Type 2 diabetes mellitus with diabetic chronic kidney disease: Secondary | ICD-10-CM | POA: Diagnosis present

## 2020-09-10 DIAGNOSIS — Z950 Presence of cardiac pacemaker: Secondary | ICD-10-CM

## 2020-09-10 DIAGNOSIS — Z7982 Long term (current) use of aspirin: Secondary | ICD-10-CM

## 2020-09-10 LAB — COMPREHENSIVE METABOLIC PANEL
ALT: 21 U/L (ref 0–44)
AST: 34 U/L (ref 15–41)
Albumin: 3.2 g/dL — ABNORMAL LOW (ref 3.5–5.0)
Alkaline Phosphatase: 62 U/L (ref 38–126)
Anion gap: 13 (ref 5–15)
BUN: 14 mg/dL (ref 8–23)
CO2: 28 mmol/L (ref 22–32)
Calcium: 8.6 mg/dL — ABNORMAL LOW (ref 8.9–10.3)
Chloride: 95 mmol/L — ABNORMAL LOW (ref 98–111)
Creatinine, Ser: 3.41 mg/dL — ABNORMAL HIGH (ref 0.44–1.00)
GFR, Estimated: 12 mL/min — ABNORMAL LOW (ref >60)
Glucose, Bld: 91 mg/dL (ref 70–99)
Potassium: 4 mmol/L (ref 3.5–5.1)
Sodium: 136 mmol/L (ref 135–145)
Total Bilirubin: 1.1 mg/dL (ref 0.3–1.2)
Total Protein: 5.4 g/dL — ABNORMAL LOW (ref 6.5–8.1)

## 2020-09-10 LAB — GLUCOSE, CAPILLARY: Glucose-Capillary: 109 mg/dL — ABNORMAL HIGH (ref 70–99)

## 2020-09-10 LAB — CBC
HCT: 28.2 % — ABNORMAL LOW (ref 36.0–46.0)
Hemoglobin: 9.1 g/dL — ABNORMAL LOW (ref 12.0–15.0)
MCH: 30.1 pg (ref 26.0–34.0)
MCHC: 32.3 g/dL (ref 30.0–36.0)
MCV: 93.4 fL (ref 80.0–100.0)
Platelets: 128 10*3/uL — ABNORMAL LOW (ref 150–400)
RBC: 3.02 MIL/uL — ABNORMAL LOW (ref 3.87–5.11)
RDW: 15.5 % (ref 11.5–15.5)
WBC: 3.5 10*3/uL — ABNORMAL LOW (ref 4.0–10.5)
nRBC: 0 % (ref 0.0–0.2)

## 2020-09-10 LAB — RESP PANEL BY RT-PCR (FLU A&B, COVID) ARPGX2
Influenza A by PCR: NEGATIVE
Influenza B by PCR: NEGATIVE
SARS Coronavirus 2 by RT PCR: NEGATIVE

## 2020-09-10 LAB — LIPASE, BLOOD: Lipase: 19 U/L (ref 11–51)

## 2020-09-10 LAB — MRSA PCR SCREENING: MRSA by PCR: NEGATIVE

## 2020-09-10 LAB — TROPONIN I (HIGH SENSITIVITY)
Troponin I (High Sensitivity): 168 ng/L (ref <18)
Troponin I (High Sensitivity): 159 ng/L (ref <18)

## 2020-09-10 MED ORDER — FENTANYL CITRATE (PF) 100 MCG/2ML IJ SOLN
50.0000 ug | Freq: Once | INTRAMUSCULAR | Status: AC
  Administered 2020-09-10: 50 ug via INTRAVENOUS
  Filled 2020-09-10: qty 2

## 2020-09-10 MED ORDER — IOHEXOL 350 MG/ML SOLN: 100.0000 mL | Freq: Once | INTRAVENOUS | Status: DC

## 2020-09-10 MED ORDER — TRIMETHOBENZAMIDE HCL 300 MG PO CAPS
300.0000 mg | ORAL_CAPSULE | Freq: Four times a day (QID) | ORAL | Status: DC | PRN
  Filled 2020-09-10: qty 1

## 2020-09-10 MED ORDER — IOHEXOL 350 MG/ML SOLN
80.0000 mL | Freq: Once | INTRAVENOUS | Status: AC | PRN
  Administered 2020-09-10: 80 mL via INTRAVENOUS

## 2020-09-10 MED ORDER — TRIMETHOBENZAMIDE HCL 100 MG/ML IM SOLN
200.0000 mg | Freq: Three times a day (TID) | INTRAMUSCULAR | Status: DC | PRN
  Filled 2020-09-10: qty 2

## 2020-09-10 MED ORDER — HYDROMORPHONE HCL 1 MG/ML IJ SOLN
1.0000 mg | Freq: Once | INTRAMUSCULAR | Status: AC
  Administered 2020-09-10: 1 mg via INTRAVENOUS
  Filled 2020-09-10: qty 1

## 2020-09-10 MED ORDER — CHLORHEXIDINE GLUCONATE CLOTH 2 % EX PADS
6.0000 | MEDICATED_PAD | Freq: Every day | CUTANEOUS | Status: DC
  Administered 2020-09-11: 6 via TOPICAL

## 2020-09-10 MED ORDER — FUROSEMIDE 10 MG/ML IJ SOLN
60.0000 mg | Freq: Once | INTRAMUSCULAR | Status: AC
  Administered 2020-09-10: 60 mg via INTRAVENOUS
  Filled 2020-09-10: qty 6

## 2020-09-10 MED ORDER — ACETAMINOPHEN 325 MG PO TABS
650.0000 mg | ORAL_TABLET | Freq: Once | ORAL | Status: AC
  Administered 2020-09-10: 650 mg via ORAL
  Filled 2020-09-10: qty 2

## 2020-09-10 NOTE — ED Provider Notes (Signed)
[facility] Provider Note   CSN: [id number] Arrival date & time: 09/10/20  1024     History Chief Complaint  Patient presents with  . Abdominal Pain    [name] is a 86 y.o. female.  HPI 86 yo female ho esrd on dialysis, t/th/s, multiple myeloma, dm presents today with midabdominal pain radiating through to back that began yesterday morning.  Patient describes pain as sharp and 6-8/10.  No worsening or alleviating factors.  Patient has not eaten today but denies nausea, vomiting, diarrhea.  She ate yesterday and does not think there is a relationship to food. Patient received fentanyl 100 mc by ems prior to arrival.    Multiple myeloma- last infusion of darzalex faspro/velcade 11/10 Past Medical History:  Diagnosis Date  . Anemia   . Arthritis   . Benign paroxysmal positional vertigo 04/06/2010  . Benign positional vertigo   . 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 CAROTID BRUIT 02/24/2008  . Complication of anesthesia    took a long time to wake up with knee replacement  . DIABETES MELLITUS, TYPE II 02/24/2008  . ESRD (end stage renal disease) on dialysis ([place])    tues thurs [facility] sees [place] [facility]  . GERD (gastroesophageal reflux disease)   . Glaucoma   . Gout   . HYPERTENSION 02/24/2008  . Multiple myeloma ([place])   . Obstructive sleep apnea   . Pancytopenia ([place])   . Sick sinus syndrome ([place])   . Wears dentures   . Wears glasses     Patient Active Problem List   Diagnosis Date Noted  . Acute respiratory failure with hypoxia ([place]) 04/02/2020  . Elevated troponin 04/02/2020  . Hypotension 03/23/2020  . Left hip pain 01/12/2020  . Vitamin B12 deficiency 10/20/2019  . Deficiency anemia 09/26/2019  . Anaphylactic shock, unspecified, sequela 07/25/2019  . Complication of vascular dialysis catheter 07/21/2019  . Peripheral neuropathy due to chemotherapy ([place]) 07/04/2019  . Encounter for removal of sutures  07/03/2019  . Goals of care, counseling/discussion 05/30/2019  . Pressure injury of skin 05/26/2019  . Yeast infection of the skin 04/04/2019  . Cancer associated pain 03/26/2019  . Moderate protein-calorie malnutrition ([place]) 03/24/2019  . Nausea in adult 03/14/2019  . ESRD (end stage renal disease) on dialysis ([place]) 03/14/2019  . Other constipation 03/14/2019  . Coagulation defect, unspecified ([place]) 03/13/2019  . Pancytopenia, acquired ([place]) 03/12/2019  . Anemia in chronic kidney disease 03/11/2019  . Bradycardia, unspecified 03/11/2019  . Iron deficiency anemia, unspecified 03/11/2019  . Secondary hyperparathyroidism of renal origin ([place]) 03/11/2019  . Multiple myeloma not having achieved remission ([place]) 03/03/2019  . Hypokalemia 02/25/2019  . Anemia   . Shingles 03/09/2015  . Sick sinus syndrome ([place]) 02/03/2015  . Shortness of breath 02/01/2014  . Fatigue 02/01/2014  . BENIGN PAROXYSMAL POSITIONAL VERTIGO 04/06/2010  . BRADYCARDIA 01/27/2009  . Diabetes mellitus ([place]) 02/24/2008  . Obstructive sleep apnea 02/24/2008  . 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 Essential hypertension 02/24/2008  . ALLERGIC RHINITIS 02/24/2008  . CAROTID BRUIT 02/24/2008    Past Surgical History:  Procedure Laterality Date  . A/V FISTULAGRAM Right 03/08/2020   Procedure: A/V FISTULAGRAM- Right Arm;  Surgeon: Waynetta Sandy, MD;  Location: [facility];  Service: Cardiovascular;  Laterality: Right;  . A/V FISTULAGRAM N/A 06/28/2020   Procedure: A/V FISTULAGRAM - Right Upper Arm;  Surgeon: Waynetta Sandy, MD;  Location: [facility];  Service: Cardiovascular;  Laterality: N/A;  . ABDOMINAL HYSTERECTOMY     1980's  . AV FISTULA PLACEMENT Right 03/11/2019   Procedure: CREATION RIGHT ARM RADIOCEPHALIC ARTERIOVENOUS FISTULA;  Surgeon: Angelia Mould, MD;  Location: [facility];  Service: Vascular;  Laterality: Right;  . AV FISTULA PLACEMENT Right 03/25/2020    Procedure: right arm ARTERIOVENOUS (AV) FISTULA CREATION;  Surgeon: Waynetta Sandy, MD;  Location: [facility];  Service: Vascular;  Laterality: Right;  . BACK SURGERY     x 3  . BIOPSY  06/24/2019   Procedure: BIOPSY;  Surgeon: Ronald Lobo, MD;  Location: [facility];  Service: Endoscopy;;  . EP IMPLANTABLE DEVICE N/A 05/16/2016   Procedure: PPM Generator Changeout;  Surgeon: Thompson Grayer, MD;  Location: [facility];  Service: Cardiovascular;  Laterality: N/A;  . ESOPHAGOGASTRODUODENOSCOPY (EGD) WITH PROPOFOL N/A 06/24/2019   Procedure: ESOPHAGOGASTRODUODENOSCOPY (EGD) WITH PROPOFOL;  Surgeon: Ronald Lobo, MD;  Location: [facility];  Service: Endoscopy;  Laterality: N/A;  . FISTULA SUPERFICIALIZATION Right 03/11/2019   Procedure: FISTULA SUPERFICIALIZATION;  Surgeon: Angelia Mould, MD;  Location: [facility];  Service: Vascular;  Laterality: Right;  . INSERTION OF DIALYSIS CATHETER Right 03/25/2020   Procedure: INSERTION OF DIALYSIS CATHETER, right internal jugular;  Surgeon: Waynetta Sandy, MD;  Location: [facility];  Service: Vascular;  Laterality: Right;  . IR FLUORO GUIDE CV LINE RIGHT  03/07/2019  . IR US GUIDE VASC ACCESS RIGHT  03/07/2019  . KNEE SURGERY Right    2003  . KNEE SURGERY Left    2011  . PACEMAKER INSERTION    . PERIPHERAL VASCULAR BALLOON ANGIOPLASTY Right 06/28/2020   Procedure: PERIPHERAL VASCULAR BALLOON ANGIOPLASTY;  Surgeon: Waynetta Sandy, MD;  Location: [facility];  Service: Cardiovascular;  Laterality: Right;     OB History   No obstetric history on file.     Family History  Problem Relation Age of Onset  . Congestive Heart Failure Mother   . Tuberculosis Mother   . Multiple myeloma Mother   . Bone cancer Father   . Arthritis Father   . Breast cancer Sister   . Colon cancer Sister   . Hypertension Sister   . Dementia Sister   . Alcohol abuse Son     Social History   Tobacco Use  . Smoking status: Never  Smoker  . Smokeless tobacco: Never Used  Vaping Use  . Vaping Use: Never used  Substance Use Topics  . Alcohol use: No  . Drug use: No    Home Medications Prior to Admission medications   Medication Sig Start Date End Date Taking? Authorizing Provider  acetaminophen (TYLENOL) 500 MG tablet Take 1,000 mg by mouth 2 (two) times daily as needed for mild pain or moderate pain.     [provider]  acyclovir (ZOVIRAX) 400 MG tablet Take 1 tablet (400 mg total) by mouth daily. 04/15/20   Heath Lark, MD  allopurinol (ZYLOPRIM) 100 MG tablet Take 0.5 tablets (50 mg total) by mouth daily. Patient taking differently: Take 50 mg by mouth at bedtime.  03/12/19   Nita Sells, MD  aspirin EC 81 MG tablet Take 81 mg by mouth daily. Swallow whole.    [provider]  B Complex-C-Zn-Folic Acid (DIALYVITE 761-YWVP 15) 0.8 MG TABS Take 1 tablet by mouth every evening.  03/24/19   [provider]  cholecalciferol (VITAMIN D3) 25 MCG (1000 UT) tablet Take 2,000 Units by mouth daily.  [provider]  dorzolamide (TRUSOPT) 2 % ophthalmic solution Place 1 drop into both eyes 2 (two) times daily.  12/21/17   [provider]  fluorometholone (FML) 0.1 % ophthalmic suspension Place 1 drop into the right eye daily.  02/01/19   [provider]  glimepiride (AMARYL) 1 MG tablet Take 0.5 mg by mouth at bedtime.  04/07/20   [provider]  isosorbide-hydrALAZINE (BIDIL) 20-37.5 MG tablet Take by mouth. 06/24/20   [provider]  lactulose (CHRONULAC) 10 GM/15ML solution TAKE 15 MLS (10 G TOTAL) BY MOUTH DAILY AS NEEDED FOR MILD CONSTIPATION. 08/30/20 09/29/20  Heath Lark, MD  lenalidomide (REVLIMID) 2.5 MG capsule Take 1 capsule three times a week after dialysis on Tuesday, Thursday and Saturdays 09/03/20   Heath Lark, MD  lidocaine-prilocaine (EMLA) cream Apply 1 application topically as needed [facility]).  05/26/20   [provider]    LUMIGAN 0.01 % SOLN Place 1 drop into the left eye at bedtime.  02/01/19   [provider]  LYRICA 50 MG capsule Take 50 mg by mouth at bedtime.  06/14/15   [provider]  Methoxy PEG-Epoetin Beta (MIRCERA IJ) Mircera 07/01/20 06/30/21  [provider]  ondansetron (ZOFRAN) 4 MG tablet TAKE 1 TABLET BY MOUTH EVERY 8 HOURS AS NEEDED FOR NAUSEA 07/21/20   Alvy Bimler, Ni, MD  oxyCODONE (OXY IR/ROXICODONE) 5 MG immediate release tablet Take 1 tablet (5 mg total) by mouth every 4 (four) hours as needed for severe pain. 08/04/20   Heath Lark, MD  pantoprazole (PROTONIX) 40 MG tablet Take 1 tablet (40 mg total) by mouth daily as needed (acid reflux/indigestion.). 06/26/19   Heath Lark, MD  polyethylene glycol (MIRALAX / GLYCOLAX) 17 g packet Take 17 g by mouth daily.     [provider]  prochlorperazine (COMPAZINE) 5 MG tablet TAKE 1 TABLET (5 MG TOTAL) BY MOUTH EVERY 6 (SIX) HOURS AS NEEDED FOR NAUSEA OR VOMITING. 05/27/20   Heath Lark, MD  senna-docusate (SENOKOT-S) 8.6-50 MG tablet Take 2 tablets by mouth 2 (two) times daily. Patient taking differently: Take 2 tablets by mouth daily.  03/11/19   Nita Sells, MD  sevelamer carbonate (RENVELA) 800 MG tablet Take 1,600 mg by mouth 3 (three) times daily with meals.  04/25/19   [provider]  sodium chloride (OCEAN) 0.65 % SOLN nasal spray Place 1 spray into both nostrils 4 (four) times daily as needed for congestion.     [provider]  traMADol (ULTRAM) 50 MG tablet Take 2 tablets (100 mg total) by mouth every 6 (six) hours as needed. 08/25/20   Heath Lark, MD    Allergies    Aspirin, Hydrocodone-acetaminophen, Morphine, Penicillins, Betaine, Dacarbazine, Ketorolac tromethamine, Brimonidine, Diltiazem hcl, Hydrocodone-acetaminophen, Neurontin [gabapentin], and Sulfacetamide sodium  Review of Systems   Review of Systems  All other systems reviewed and are negative.   Physical Exam Updated  Vital Signs BP (!) 151/65   Pulse 76   Temp 97.8 F (36.6 C) (Oral)   Resp 16   Ht 1.664 m (5' 5.5")   Wt 54 kg   SpO2 99%   BMI 19.50 kg/m   Physical Exam Vitals and nursing note reviewed.  Constitutional:      General: She is not in acute distress.    Appearance: She is well-developed. She is not ill-appearing.  HENT:     Head: Normocephalic.     Comments: msk- no obvioud trauma or swelling Dialysis acces rue,no s/s  infection Left chest wall with pacer in place with normal pocket    Mouth/Throat:     Mouth: Mucous membranes are moist.  Eyes:     Extraocular Movements: Extraocular movements intact.  Cardiovascular:     Rate and Rhythm: Normal rate and regular rhythm.     Comments: Pulses equal bilateral radial and femoral  Pulmonary:     Effort: Pulmonary effort is normal.     Breath sounds: Normal breath sounds.  Abdominal:     General: Abdomen is flat. Bowel sounds are normal.     Palpations: Abdomen is soft.     Comments: Palpable mid upper abdomen pulsatile mass  Skin:    General: Skin is warm.  Neurological:     General: No focal deficit present.     Mental Status: She is alert.  Psychiatric:        Mood and Affect: Mood normal.     ED Results / Procedures / Treatments   Labs (all labs ordered are listed, but only abnormal results are displayed) Labs Reviewed - No data to display  EKG EKG Interpretation  Date/Time:  Friday September 10 2020 10:42:55 EST Ventricular Rate:  74 PR Interval:    QRS Duration: 175 QT Interval:  514 QTC Calculation: 571 R Axis:   -74 Text Interpretation: A-V dual-paced rhythm with some inhibition No further analysis attempted due to paced rhythm Confirmed by Pattricia Boss [phone]) on 09/10/2020 2:29:06 PM   Radiology CT ANGIO CHEST AORTA W/CM & OR WO/CM  Result Date: 09/10/2020 CLINICAL DATA:  Abdominal pain radiating to back EXAM: CT ANGIOGRAPHY CHEST WITH CONTRAST TECHNIQUE: Multidetector CT imaging of the chest was  performed using the standard protocol during bolus administration of intravenous contrast. Multiplanar CT image reconstructions and MIPs were obtained to evaluate the vascular anatomy. CONTRAST:  64m OMNIPAQUE IOHEXOL 350 MG/ML SOLN COMPARISON:  04/02/2020 FINDINGS: Cardiovascular: Satisfactory opacification of the pulmonary arteries to the segmental level. No evidence of pulmonary embolism. Thoracic aorta is normal in caliber with atherosclerotic plaque. No evidence of dissection. Cardiomegaly. Left chest wall pacemaker with atrial and ventricular leads. No pericardial effusion. Mediastinum/Nodes: No enlarged nodes identified. Thyroid is unremarkable. Lungs/Pleura: Moderate right pleural effusion adjacent right lower lobe atelectasis. Small left pleural effusion with adjacent left lower lobe atelectasis. Mild interlobular septal thickening likely reflecting edema. Upper Abdomen: No acute abnormality. Musculoskeletal: Numerous foci of lucency reflecting known multiple myeloma. No new compression deformity. Advanced degenerative changes at the glenohumeral joints. Review of the MIP images confirms the above findings. IMPRESSION: No evidence of acute pulmonary embolism or aortic dissection. Moderate right and small left pleural effusions with adjacent bilateral lower lobe atelectasis. Mild interstitial pulmonary edema. Cardiomegaly. Electronically Signed   By: PMacy MisM.D.   On: 09/10/2020 12:07    Procedures Procedures (including critical care time)  Medications Ordered in ED Medications - No data to display  ED Course  I have reviewed the triage vital signs and the nursing notes.  Pertinent labs & imaging results that were available during my care of the patient were reviewed by me and considered in my medical decision making (see chart for details).  Clinical Course as of Sep 10 1513  Fri Sep 10, 2020  1432 Hemoglobin(!): 9.1 [DR]  1432 Anemia stable from prior    [DR]  1432 Creatinine  elevated with known esrd on dialysi  Creatinine(!): 3.41 [DR]  1433 Cta without evidence of pe, moderate right and small left pleural effusion   [DR]  1433 Elevated troponin Patient with elevated trop in July  Troponin I (High Sensitivity)(!!): 159 [DR]    Clinical Course User Index [DR] Pattricia Boss, MD   MDM Rules/Calculators/A&P                          86 yo female multiple myeloma presents with upper abdomen/lower chest pain. CT done to rule out acute dissection or AAA as patient had pulsatile mass in her upper abdomen.  Does not appear to be any acute intra-abdominal or intrathoracic process defined on CT.  Patient does have elevated troponins here with first troponin I 68 and repeat 159.  She has had some elevated troponins in the past that were thought to be due to her status as a dialysis patient.  However, today, the elevated troponin with chest pain, will need to be trended and have further investigation.  Patient also has bilateral effusions and has had some dyspnea here.  She does not have a low oxygen saturation but is much more comfortable on oxygen. Discussed care with patient, daughter, and Dr. Tamala Julian.  Plan admission for ongoing monitoring, evaluation, and pain management. Final Clinical Impression(s) / ED Diagnoses Final diagnoses:  None    Rx / DC Orders ED Discharge Orders    None       Pattricia Boss, MD 09/10/20 1645

## 2020-09-10 NOTE — H&P (Signed)
History and Physical    [name] HKV:[id number] DOB: 19-Nov-1929 DOA: 09/10/2020  Referring MD/NP/PA: Pattricia Boss, MD PCP: Lucianne Lei, MD  Patient coming from: home  Chief Complaint: Shortness of breath and abdominal discomfort  I have personally briefly reviewed patient's old medical records in [place]   HPI: [name] is a 86 y.o. female with medical history significant of ESRD on HD (TTS), sick sinus syndrome s/p pacemaker, multiple myeloma, diabetes mellitus type 2, OSA on CPAP, multiple myeloma not in remission presents with complaints of shortness of breath and abdominal discomfort over the last day.  Last night patient states that she was unable to lay flat due to feeling short of breath and sat up in recliner all night.  Normally she is not on oxygen at baseline.  Patient also noted complaints of having abdominal fullness and bloat.  Last had hemodialysis 2 days ago and is scheduled to go for hemodialysis tomorrow.  Over the last several weeks patient reports that she has had little to no appetite and recently notes losing approximately 1 pound or so.  She has been receiving Revlimid infusions for treatment of her multiple myeloma by Dr. Alvy Bimler and is due for a another infusion next week.  Denies having any significant chest pain, nausea, vomiting, diarrhea, blood in stool/urine, dysuria, or fever symptoms.  Last bowel movement was 2 days ago.  She still is able to make urine.  ED Course: Upon admission into the emergency department patient was seen to be afebrile with vital signs relatively within normal limits.  Patient had been placed on 2 L of nasal cannula oxygen for comfort, but was never noted to be hypoxic.  Revealed WBC 3.5 hemoglobin 9.1, platelets 128, potassium 4, BUN 14, creatinine 3.41, lipase 19, and troponin 168 ->159.  CT scan of the abdomen pelvis showed cardiomegaly with moderate right and small left pleural effusion with adjacent bilateral lower  lobe atelectasis and edema.  Patient has been given 650 mg of Tylenol, 60 mg of Lasix IV and 50 mcg of fentanyl.  Respiratory virus panel negative.   Review of Systems  Constitutional: Positive for weight loss. Negative for fever.  HENT: Negative for congestion and nosebleeds.   Eyes: Negative for double vision and photophobia.  Respiratory: Positive for shortness of breath. Negative for cough.   Cardiovascular: Positive for palpitations and orthopnea. Negative for chest pain and leg swelling.  Gastrointestinal: Positive for abdominal pain. Negative for blood in stool and diarrhea.  Genitourinary: Negative for dysuria and hematuria.  Musculoskeletal: Negative for joint pain.  Skin: Negative for rash.  Neurological: Negative for focal weakness and loss of consciousness.  Psychiatric/Behavioral: Negative for substance abuse. The patient has insomnia.     Past Medical History:  Diagnosis Date  . Anemia   . Arthritis   . Benign paroxysmal positional vertigo 04/06/2010  . Benign positional vertigo   . 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 CAROTID BRUIT 02/24/2008  . Complication of anesthesia    took a long time to wake up with knee replacement  . DIABETES MELLITUS, TYPE II 02/24/2008  . ESRD (end stage renal disease) on dialysis ([place])    tues thurs sat [facility] kidney center sees [place] [facility]  . GERD (gastroesophageal reflux disease)   . Glaucoma   . Gout   . HYPERTENSION 02/24/2008  . Multiple myeloma ([place])   . Obstructive sleep apnea   . Pancytopenia ([place])   . Sick sinus syndrome ([place])   .  Wears dentures   . Wears glasses     Past Surgical History:  Procedure Laterality Date  . A/V FISTULAGRAM Right 03/08/2020   Procedure: A/V FISTULAGRAM- Right Arm;  Surgeon: Waynetta Sandy, MD;  Location: [facility];  Service: Cardiovascular;  Laterality: Right;  . A/V FISTULAGRAM N/A 06/28/2020   Procedure: A/V FISTULAGRAM - Right Upper Arm;  Surgeon: Waynetta Sandy,  MD;  Location: [facility];  Service: Cardiovascular;  Laterality: N/A;  . ABDOMINAL HYSTERECTOMY     1980's  . AV FISTULA PLACEMENT Right 03/11/2019   Procedure: CREATION RIGHT ARM RADIOCEPHALIC ARTERIOVENOUS FISTULA;  Surgeon: Angelia Mould, MD;  Location: [facility];  Service: Vascular;  Laterality: Right;  . AV FISTULA PLACEMENT Right 03/25/2020   Procedure: right arm ARTERIOVENOUS (AV) FISTULA CREATION;  Surgeon: Waynetta Sandy, MD;  Location: [facility];  Service: Vascular;  Laterality: Right;  . BACK SURGERY     x 3  . BIOPSY  06/24/2019   Procedure: BIOPSY;  Surgeon: Ronald Lobo, MD;  Location: [facility];  Service: Endoscopy;;  . EP IMPLANTABLE DEVICE N/A 05/16/2016   Procedure: PPM Generator Changeout;  Surgeon: Thompson Grayer, MD;  Location: [facility];  Service: Cardiovascular;  Laterality: N/A;  . ESOPHAGOGASTRODUODENOSCOPY (EGD) WITH PROPOFOL N/A 06/24/2019   Procedure: ESOPHAGOGASTRODUODENOSCOPY (EGD) WITH PROPOFOL;  Surgeon: Ronald Lobo, MD;  [facility]: [facility];  Service: Endoscopy;  Laterality: N/A;  . FISTULA SUPERFICIALIZATION Right 03/11/2019   Procedure: FISTULA SUPERFICIALIZATION;  Surgeon: Angelia Mould, MD;  Location: [facility];  Service: Vascular;  Laterality: Right;  . INSERTION OF DIALYSIS CATHETER Right 03/25/2020   Procedure: INSERTION OF DIALYSIS CATHETER, right internal jugular;  Surgeon: Waynetta Sandy, MD;  Location: [facility];  Service: Vascular;  Laterality: Right;  . IR FLUORO GUIDE CV LINE RIGHT  03/07/2019  . IR US GUIDE VASC ACCESS RIGHT  03/07/2019  . KNEE SURGERY Right    2003  . KNEE SURGERY Left    2011  . PACEMAKER INSERTION    . PERIPHERAL VASCULAR BALLOON ANGIOPLASTY Right 06/28/2020   Procedure: PERIPHERAL VASCULAR BALLOON ANGIOPLASTY;  Surgeon: Waynetta Sandy, MD;  Location: [facility];  Service: Cardiovascular;  Laterality: Right;     reports that she has never smoked. She has never  used smokeless tobacco. She reports that she does not drink alcohol and does not use drugs.  Allergies  Allergen Reactions  . Aspirin Other (See Comments)    REACTION: STOMACH ISSUES WITH DOSE HIGHER THAN 81 MG  Other reaction(s): GI Upset (intolerance), Other (See Comments) REACTION: STOMACH ISSUES WITH DOSE HIGHER THAN 81 MG  REACTION: STOMACH ISSUES WITH DOSE HIGHER THAN 81 MG   . Hydrocodone-Acetaminophen Nausea And Vomiting  . Morphine Nausea And Vomiting  . Penicillins Rash    Patient took injection and tablets, had a reaction. She has taken amoxicillin with no reaction Has patient had a PCN reaction causing immediate rash, facial/tongue/throat swelling, SOB or lightheadedness with hypotension: Yes Has patient had a PCN reaction causing severe rash involving mucus membranes or skin necrosis: Yes Has patient had a PCN reaction that required hospitalization No Has patient had a PCN reaction occurring within the last 10 years: No If all of the above answers are "NO", then m Patient took injection and tablets, had a reaction. She has taken amoxicillin with no reaction Patient took injection and tablets, had a reaction. She has taken amoxicillin with no reaction Has patient had  a PCN reaction causing immediate rash, facial/tongue/throat swelling, SOB or lightheadedness with hypotension: Yes Has patient had a PCN reaction causing severe rash involving mucus membranes or skin necrosis: Yes Has patient had a PCN reaction that required hospitalization No Has patient had a PCN reaction occurring within the last 10 years: No If all of the above answers are "NO", then m  . Betaine Nausea And Vomiting  . Dacarbazine Nausea And Vomiting  . Ketorolac Tromethamine Itching  . Brimonidine Itching  . Diltiazem Hcl Rash  . Hydrocodone-Acetaminophen Nausea And Vomiting  . Neurontin [Gabapentin] Nausea And Vomiting  . Sulfacetamide Sodium Rash    Family History  Problem Relation Age of Onset  .  Congestive Heart Failure Mother   . Tuberculosis Mother   . Multiple myeloma Mother   . Bone cancer Father   . Arthritis Father   . Breast cancer Sister   . Colon cancer Sister   . Hypertension Sister   . Dementia Sister   . Alcohol abuse Son     Prior to Admission medications   Medication Sig Start Date End Date Taking? Authorizing Provider  acetaminophen (TYLENOL) 500 MG tablet Take 1,000 mg by mouth 2 (two) times daily as needed for mild pain or moderate pain.     [provider]  acyclovir (ZOVIRAX) 400 MG tablet Take 1 tablet (400 mg total) by mouth daily. 04/15/20   Heath Lark, MD  allopurinol (ZYLOPRIM) 100 MG tablet Take 0.5 tablets (50 mg total) by mouth daily. Patient taking differently: Take 50 mg by mouth at bedtime.  03/12/19   Nita Sells, MD  aspirin EC 81 MG tablet Take 81 mg by mouth daily. Swallow whole.    [provider]  B Complex-C-Zn-Folic Acid (DIALYVITE 784-[id number] 15) 0.8 MG TABS Take 1 tablet by mouth every evening.  03/24/19   [provider]  cholecalciferol (VITAMIN D3) 25 MCG (1000 UT) tablet Take 2,000 Units by mouth daily.    [provider]  dorzolamide (TRUSOPT) 2 % ophthalmic solution Place 1 drop into both eyes 2 (two) times daily.  12/21/17   [provider]  fluorometholone (FML) 0.1 % ophthalmic suspension Place 1 drop into the right eye daily.  02/01/19   [provider]  glimepiride (AMARYL) 1 MG tablet Take 0.5 mg by mouth at bedtime.  04/07/20   [provider]  isosorbide-hydrALAZINE (BIDIL) 20-37.5 MG tablet Take by mouth. 06/24/20   [provider]  lactulose (CHRONULAC) 10 GM/15ML solution TAKE 15 MLS (10 G TOTAL) BY MOUTH DAILY AS NEEDED FOR MILD CONSTIPATION. 08/30/20 09/29/20  Heath Lark, MD  lenalidomide (REVLIMID) 2.5 MG capsule Take 1 capsule three times a week after dialysis on Tuesday, Thursday and Saturdays 09/03/20   Heath Lark, MD  lidocaine-prilocaine (EMLA)  cream Apply 1 application topically as needed [facility]).  05/26/20   [provider]  LUMIGAN 0.01 % SOLN Place 1 drop into the left eye at bedtime.  02/01/19   [provider]  LYRICA 50 MG capsule Take 50 mg by mouth at bedtime.  06/14/15   [provider]  Methoxy PEG-Epoetin Beta (MIRCERA IJ) Mircera 07/01/20 06/30/21  [provider]  ondansetron (ZOFRAN) 4 MG tablet TAKE 1 TABLET BY MOUTH EVERY 8 HOURS AS NEEDED FOR NAUSEA 07/21/20   Alvy Bimler, Ni, MD  oxyCODONE (OXY IR/ROXICODONE) 5 MG immediate release tablet Take 1 tablet (5 mg total) by mouth every 4 (four) hours as needed for severe pain. 08/04/20  Heath Lark, MD  pantoprazole (PROTONIX) 40 MG tablet Take 1 tablet (40 mg total) by mouth daily as needed (acid reflux/indigestion.). 06/26/19   Heath Lark, MD  polyethylene glycol (MIRALAX / GLYCOLAX) 17 g packet Take 17 g by mouth daily.     [provider]  prochlorperazine (COMPAZINE) 5 MG tablet TAKE 1 TABLET (5 MG TOTAL) BY MOUTH EVERY 6 (SIX) HOURS AS NEEDED FOR NAUSEA OR VOMITING. 05/27/20   Heath Lark, MD  senna-docusate (SENOKOT-S) 8.6-50 MG tablet Take 2 tablets by mouth 2 (two) times daily. Patient taking differently: Take 2 tablets by mouth daily.  03/11/19   Nita Sells, MD  sevelamer carbonate (RENVELA) 800 MG tablet Take 1,600 mg by mouth 3 (three) times daily with meals.  04/25/19   [provider]  sodium chloride (OCEAN) 0.65 % SOLN nasal spray Place 1 spray into both nostrils 4 (four) times daily as needed for congestion.     [provider]  traMADol (ULTRAM) 50 MG tablet Take 2 tablets (100 mg total) by mouth every 6 (six) hours as needed. 08/25/20   Heath Lark, MD    Physical Exam:  Constitutional: Elderly female who appears to be in no acute distress at this time. Vitals:   09/10/20 1330 09/10/20 1400 09/10/20 1430 09/10/20 1500  BP: (!) 161/68 (!) 149/71 (!) 152/78 (!) 161/63  Pulse: 73 70 75 77  Resp:  16 13 20 15   Temp:      TempSrc:      SpO2: 100% 100% 100% 99%  Weight:      Height:       Eyes: PERRL, lids and conjunctivae normal ENMT: Mucous membranes are moist. Posterior pharynx clear of any exudate or lesions.  Neck: normal, supple, no masses, no thyromegaly Respiratory: Positive crackles heard in the mid to lower lung fields bilaterally.  Currently on 2 L nasal cannula oxygen for comfort.   Cardiovascular: Regular rate and rhythm, no murmurs / rubs / gallops. No extremity edema. 2+ pedal pulses. No carotid bruits.  Right forearm fistula. Abdomen: no tenderness, no masses palpated. No hepatosplenomegaly. Bowel sounds positive.  Musculoskeletal: no clubbing / cyanosis. No joint deformity upper and lower extremities. Good ROM, no contractures. Normal muscle tone.  Skin: no rashes, lesions, ulcers. No induration Neurologic: CN 2-12 grossly intact. Sensation intact, DTR normal. Strength 5/5 in all 4.  Psychiatric: Normal judgment and insight. Alert and oriented x 3. Normal mood.     Labs on Admission: I have personally reviewed following labs and imaging studies  CBC: Recent Labs  Lab 09/10/20 1047  WBC 3.5*  HGB 9.1*  HCT 28.2*  MCV 93.4  PLT 277*   Basic Metabolic Panel: Recent Labs  Lab 09/10/20 1047  NA 136  K 4.0  CL 95*  CO2 28  GLUCOSE 91  BUN 14  CREATININE 3.41*  CALCIUM 8.6*   GFR: Estimated Creatinine Clearance: 9.7 mL/min (A) (by C-G formula based on SCr of 3.41 mg/dL (H)). Liver Function Tests: Recent Labs  Lab 09/10/20 1047  AST 34  ALT 21  ALKPHOS 62  BILITOT 1.1  PROT 5.4*  ALBUMIN 3.2*   Recent Labs  Lab 09/10/20 1047  LIPASE 19   No results for input(s): AMMONIA in the last 168 hours. Coagulation Profile: No results for input(s): INR, PROTIME in the last 168 hours. Cardiac Enzymes: No results for input(s): CKTOTAL, CKMB, CKMBINDEX, TROPONINI in the last 168 hours. BNP (last 3 results) No results for input(s): PROBNP in  the  last 8760 hours. HbA1C: No results for input(s): HGBA1C in the last 72 hours. CBG: No results for input(s): GLUCAP in the last 168 hours. Lipid Profile: No results for input(s): CHOL, HDL, LDLCALC, TRIG, CHOLHDL, LDLDIRECT in the last 72 hours. Thyroid Function Tests: No results for input(s): TSH, T4TOTAL, FREET4, T3FREE, THYROIDAB in the last 72 hours. Anemia Panel: No results for input(s): VITAMINB12, FOLATE, FERRITIN, TIBC, IRON, RETICCTPCT in the last 72 hours. Urine analysis:    Component Value Date/Time   COLORURINE YELLOW 11/27/2019 1430   APPEARANCEUR CLEAR 11/27/2019 1430   LABSPEC 1.010 11/27/2019 1430   PHURINE 9.0 (H) 11/27/2019 1430   GLUCOSEU NEGATIVE 11/27/2019 1430   HGBUR NEGATIVE 11/27/2019 1430   BILIRUBINUR NEGATIVE 11/27/2019 1430   KETONESUR NEGATIVE 11/27/2019 1430   PROTEINUR NEGATIVE 11/27/2019 1430   UROBILINOGEN 1.0 10/05/2012 1953   NITRITE NEGATIVE 11/27/2019 1430   LEUKOCYTESUR SMALL (A) 11/27/2019 1430   Sepsis Labs: No results found for this or any previous visit (from the past 240 hour(s)).   Radiological Exams on Admission: CT ANGIO CHEST AORTA W/CM & OR WO/CM  Result Date: 09/10/2020 CLINICAL DATA:  Abdominal pain radiating to back EXAM: CT ANGIOGRAPHY CHEST WITH CONTRAST TECHNIQUE: Multidetector CT imaging of the chest was performed using the standard protocol during bolus administration of intravenous contrast. Multiplanar CT image reconstructions and MIPs were obtained to evaluate the vascular anatomy. CONTRAST:  73m OMNIPAQUE IOHEXOL 350 MG/ML SOLN COMPARISON:  04/02/2020 FINDINGS: Cardiovascular: Satisfactory opacification of the pulmonary arteries to the segmental level. No evidence of pulmonary embolism. Thoracic aorta is normal in caliber with atherosclerotic plaque. No evidence of dissection. Cardiomegaly. Left chest wall pacemaker with atrial and ventricular leads. No pericardial effusion. Mediastinum/Nodes: No enlarged nodes identified.  Thyroid is unremarkable. Lungs/Pleura: Moderate right pleural effusion adjacent right lower lobe atelectasis. Small left pleural effusion with adjacent left lower lobe atelectasis. Mild interlobular septal thickening likely reflecting edema. Upper Abdomen: No acute abnormality. Musculoskeletal: Numerous foci of lucency reflecting known multiple myeloma. No new compression deformity. Advanced degenerative changes at the glenohumeral joints. Review of the MIP images confirms the above findings. IMPRESSION: No evidence of acute pulmonary embolism or aortic dissection. Moderate right and small left pleural effusions with adjacent bilateral lower lobe atelectasis. Mild interstitial pulmonary edema. Cardiomegaly. Electronically Signed   By: PMacy MisM.D.   On: 09/10/2020 12:07    EKG: Independently reviewed.  Paced rhythm 74 bpm  Assessment/Plan Pulmonary edema and pleural effusions secondary to fluid overloadESRD on HD: Acute.  Patient normally dialyzes on Tuesday, Thursday, and Saturday.  Chest x-ray showing mild interstitial pulmonary edema with bilateral pleural effusion.  Labs currently stable with potassium 4, BUN 14, creatinine 3.14.  Patient had been given 60 mg of Lasix IV x1 dose and she still reports making some urine. -Admit to a medical telemetry bed -Continuous pulse oximetry with nasal cannula oxygen as needed to maintain O2 saturation greater than 90% -Continue current medication regimen -Recheck renal function panel in a.m. -Nephrology consulted for need of hemodialysis  Abdominal discomfort: Acute.  Patient planes of abdominal fullness and bloating.  CT scan of the abdomen and pelvis did not reveal any acute abnormalities as a cause for patient's symptoms.  Lipase was noted to be within normal limits.  Unclear cause of patient's symptoms at this time. -Continue to monitor  Elevated troponin: Acute on chronic.  On admission patient was high-sensitivity troponin 168-> 159.  Patient  with no complaints of chest pain at  this time.  Records note similar elevation during previous admission.  Possibly secondary to demand. -Follow-up telemetry overnight   Pancytopenia: Chronic.  On admission WBC 3.5, hemoglobin 9.1, platelets 128 which appear similar relatively stable.  Symptoms reportedly started after being started on Revlimid.  -Continue to monitor  Multiple myeloma: Patient reportedly not yet in remission.  Followed by Dr. Alvy Bimler in outpatient setting. -Dr. Alvy Bimler to the treatment team  Sick sinus syndrome s/p pacemaker -Order placed to have pacemaker interrogated  Protein calorie malnutrition: Patient complains of poor appetite.  Albumin just mildly low at 3.2 -Check prealbumin in a.m.   DVT prophylaxis: Heparin Code Status: Full Family Communication: Daughter updated over the phone Disposition Plan: Possible discharge home in 1 to 2 days Consults called: Nephrology Admission status: Observation  Norval Morton MD [facility] Pager [phone]   If 7PM-7AM, please contact night-coverage [email or web address] Password [facility]  09/10/2020, 3:23 PM

## 2020-09-10 NOTE — ED Triage Notes (Signed)
Pt arrived via [facility] from home for c/o 8/10 throbbing pain in periumbilical area that radiates to mid back started yesterday. EMS gave fentanyl 100 mcg for pain. Pt is A&Ox4. Pt is sinus and v-paced on monitor. VSS.

## 2020-09-10 NOTE — ED Notes (Signed)
Placed pt on 2L 02 per [place] for comfort, because pt was c/o SOB.

## 2020-09-10 NOTE — Progress Notes (Signed)
[facility] Nephrology Note:  Aware that [name] is being admitted OBS status. Here with abdominal pain, found to have ^ troponin which is being trended. Per chart review, she is on room air with O2 sat 98% and K is 4. COVID/Flu negative. She is due for dialysis tomorrow.  Blood pressure (!) 146/74, pulse 74, temperature 97.8 F (36.6 C), temperature source Oral, resp. rate 19, height 5' 5.5" (1.664 m), weight 54 kg, SpO2 98 %.  Plan: - Dialysis orders placed for TOMORROW - Full consult to follow at that time.  Veneta Penton, PA-C [employer] Pager [phone]

## 2020-09-10 NOTE — ED Notes (Signed)
Provided pt w/turkey bag

## 2020-09-11 ENCOUNTER — Observation Stay (HOSPITAL_COMMUNITY): Payer: Medicare HMO

## 2020-09-11 DIAGNOSIS — I5033 Acute on chronic diastolic (congestive) heart failure: Secondary | ICD-10-CM | POA: Diagnosis present

## 2020-09-11 DIAGNOSIS — E1152 Type 2 diabetes mellitus with diabetic peripheral angiopathy with gangrene: Secondary | ICD-10-CM | POA: Diagnosis present

## 2020-09-11 DIAGNOSIS — C9000 Multiple myeloma not having achieved remission: Secondary | ICD-10-CM | POA: Diagnosis present

## 2020-09-11 DIAGNOSIS — H409 Unspecified glaucoma: Secondary | ICD-10-CM | POA: Diagnosis present

## 2020-09-11 DIAGNOSIS — G4733 Obstructive sleep apnea (adult) (pediatric): Secondary | ICD-10-CM | POA: Diagnosis present

## 2020-09-11 DIAGNOSIS — J810 Acute pulmonary edema: Secondary | ICD-10-CM | POA: Diagnosis not present

## 2020-09-11 DIAGNOSIS — Z681 Body mass index (BMI) 19 or less, adult: Secondary | ICD-10-CM | POA: Diagnosis not present

## 2020-09-11 DIAGNOSIS — D61818 Other pancytopenia: Secondary | ICD-10-CM | POA: Diagnosis present

## 2020-09-11 DIAGNOSIS — E43 Unspecified severe protein-calorie malnutrition: Secondary | ICD-10-CM | POA: Diagnosis present

## 2020-09-11 DIAGNOSIS — R634 Abnormal weight loss: Secondary | ICD-10-CM | POA: Diagnosis present

## 2020-09-11 DIAGNOSIS — R778 Other specified abnormalities of plasma proteins: Secondary | ICD-10-CM | POA: Diagnosis present

## 2020-09-11 DIAGNOSIS — K219 Gastro-esophageal reflux disease without esophagitis: Secondary | ICD-10-CM | POA: Diagnosis present

## 2020-09-11 DIAGNOSIS — M109 Gout, unspecified: Secondary | ICD-10-CM | POA: Diagnosis present

## 2020-09-11 DIAGNOSIS — G4761 Periodic limb movement disorder: Secondary | ICD-10-CM | POA: Diagnosis present

## 2020-09-11 DIAGNOSIS — D631 Anemia in chronic kidney disease: Secondary | ICD-10-CM | POA: Diagnosis present

## 2020-09-11 DIAGNOSIS — N2581 Secondary hyperparathyroidism of renal origin: Secondary | ICD-10-CM | POA: Diagnosis present

## 2020-09-11 DIAGNOSIS — I132 Hypertensive heart and chronic kidney disease with heart failure and with stage 5 chronic kidney disease, or end stage renal disease: Secondary | ICD-10-CM | POA: Diagnosis present

## 2020-09-11 DIAGNOSIS — E1122 Type 2 diabetes mellitus with diabetic chronic kidney disease: Secondary | ICD-10-CM | POA: Diagnosis present

## 2020-09-11 DIAGNOSIS — J9811 Atelectasis: Secondary | ICD-10-CM | POA: Diagnosis present

## 2020-09-11 DIAGNOSIS — Z20822 Contact with and (suspected) exposure to covid-19: Secondary | ICD-10-CM | POA: Diagnosis present

## 2020-09-11 DIAGNOSIS — E8779 Other fluid overload: Secondary | ICD-10-CM | POA: Diagnosis present

## 2020-09-11 DIAGNOSIS — R54 Age-related physical debility: Secondary | ICD-10-CM | POA: Diagnosis present

## 2020-09-11 DIAGNOSIS — M25559 Pain in unspecified hip: Secondary | ICD-10-CM | POA: Diagnosis not present

## 2020-09-11 DIAGNOSIS — L89151 Pressure ulcer of sacral region, stage 1: Secondary | ICD-10-CM | POA: Diagnosis present

## 2020-09-11 DIAGNOSIS — N186 End stage renal disease: Secondary | ICD-10-CM | POA: Diagnosis present

## 2020-09-11 DIAGNOSIS — R1013 Epigastric pain: Secondary | ICD-10-CM | POA: Diagnosis present

## 2020-09-11 LAB — RENAL FUNCTION PANEL
Albumin: 3.1 g/dL — ABNORMAL LOW (ref 3.5–5.0)
Anion gap: 14 (ref 5–15)
BUN: 18 mg/dL (ref 8–23)
CO2: 28 mmol/L (ref 22–32)
Calcium: 8.4 mg/dL — ABNORMAL LOW (ref 8.9–10.3)
Chloride: 95 mmol/L — ABNORMAL LOW (ref 98–111)
Creatinine, Ser: 3.61 mg/dL — ABNORMAL HIGH (ref 0.44–1.00)
GFR, Estimated: 12 mL/min — ABNORMAL LOW (ref >60)
Glucose, Bld: 128 mg/dL — ABNORMAL HIGH (ref 70–99)
Phosphorus: 3.8 mg/dL (ref 2.5–4.6)
Potassium: 3.3 mmol/L — ABNORMAL LOW (ref 3.5–5.1)
Sodium: 137 mmol/L (ref 135–145)

## 2020-09-11 LAB — PREALBUMIN: Prealbumin: 17.5 mg/dL — ABNORMAL LOW (ref 18–38)

## 2020-09-11 LAB — CBC
HCT: 26.6 % — ABNORMAL LOW (ref 36.0–46.0)
Hemoglobin: 8.7 g/dL — ABNORMAL LOW (ref 12.0–15.0)
MCH: 29.8 pg (ref 26.0–34.0)
MCHC: 32.7 g/dL (ref 30.0–36.0)
MCV: 91.1 fL (ref 80.0–100.0)
Platelets: 146 10*3/uL — ABNORMAL LOW (ref 150–400)
RBC: 2.92 MIL/uL — ABNORMAL LOW (ref 3.87–5.11)
RDW: 15.2 % (ref 11.5–15.5)
WBC: 4.1 10*3/uL (ref 4.0–10.5)
nRBC: 0 % (ref 0.0–0.2)

## 2020-09-11 MED ORDER — LIDOCAINE HCL (PF) 1 % IJ SOLN: 5.0000 mL | INTRAMUSCULAR | Status: DC | PRN

## 2020-09-11 MED ORDER — ACETAMINOPHEN 650 MG RE SUPP: 650.0000 mg | Freq: Four times a day (QID) | RECTAL | Status: DC | PRN

## 2020-09-11 MED ORDER — LATANOPROST 0.005 % OP SOLN
1.0000 [drp] | Freq: Every day | OPHTHALMIC | Status: DC
  Administered 2020-09-11: 1 [drp] via OPHTHALMIC
  Filled 2020-09-11: qty 2.5

## 2020-09-11 MED ORDER — SEVELAMER CARBONATE 800 MG PO TABS
1600.0000 mg | ORAL_TABLET | Freq: Three times a day (TID) | ORAL | Status: DC
  Administered 2020-09-11: 800 mg via ORAL
  Administered 2020-09-12 (×2): 1600 mg via ORAL
  Filled 2020-09-11 (×4): qty 2

## 2020-09-11 MED ORDER — SODIUM CHLORIDE 0.9 % IV SOLN: 100.0000 mL | INTRAVENOUS | Status: DC | PRN

## 2020-09-11 MED ORDER — HEPARIN SODIUM (PORCINE) 1000 UNIT/ML DIALYSIS: 1000.0000 [IU] | INTRAMUSCULAR | Status: DC | PRN

## 2020-09-11 MED ORDER — HEPARIN SODIUM (PORCINE) 5000 UNIT/ML IJ SOLN
5000.0000 [IU] | Freq: Three times a day (TID) | INTRAMUSCULAR | Status: DC
  Administered 2020-09-11 – 2020-09-12 (×3): 5000 [IU] via SUBCUTANEOUS
  Filled 2020-09-11 (×3): qty 1

## 2020-09-11 MED ORDER — ONDANSETRON HCL 4 MG PO TABS
4.0000 mg | ORAL_TABLET | Freq: Three times a day (TID) | ORAL | Status: DC | PRN
  Administered 2020-09-12: 4 mg via ORAL
  Filled 2020-09-11: qty 1

## 2020-09-11 MED ORDER — DORZOLAMIDE HCL 2 % OP SOLN
1.0000 [drp] | Freq: Two times a day (BID) | OPHTHALMIC | Status: DC
  Administered 2020-09-12: 1 [drp] via OPHTHALMIC
  Filled 2020-09-11: qty 10

## 2020-09-11 MED ORDER — ALLOPURINOL 100 MG PO TABS
50.0000 mg | ORAL_TABLET | Freq: Every day | ORAL | Status: DC
  Filled 2020-09-11: qty 1

## 2020-09-11 MED ORDER — LIDOCAINE-PRILOCAINE 2.5-2.5 % EX CREA: 1.0000 "application " | TOPICAL_CREAM | CUTANEOUS | Status: DC | PRN

## 2020-09-11 MED ORDER — PROCHLORPERAZINE MALEATE 5 MG PO TABS
5.0000 mg | ORAL_TABLET | Freq: Four times a day (QID) | ORAL | Status: DC | PRN
  Filled 2020-09-11: qty 1

## 2020-09-11 MED ORDER — ONDANSETRON HCL 4 MG PO TABS: 4.0000 mg | ORAL_TABLET | Freq: Three times a day (TID) | ORAL | Status: DC | PRN

## 2020-09-11 MED ORDER — FLUOROMETHOLONE 0.1 % OP SUSP
1.0000 [drp] | Freq: Every day | OPHTHALMIC | Status: DC
  Administered 2020-09-11 – 2020-09-12 (×2): 1 [drp] via OPHTHALMIC
  Filled 2020-09-11: qty 5

## 2020-09-11 MED ORDER — LACTULOSE 10 GM/15ML PO SOLN
10.0000 g | Freq: Three times a day (TID) | ORAL | Status: DC
  Administered 2020-09-11 – 2020-09-12 (×2): 10 g via ORAL
  Filled 2020-09-11 (×2): qty 15

## 2020-09-11 MED ORDER — PANTOPRAZOLE SODIUM 40 MG PO TBEC: 40.0000 mg | DELAYED_RELEASE_TABLET | Freq: Every day | ORAL | Status: DC | PRN

## 2020-09-11 MED ORDER — PREGABALIN 25 MG PO CAPS
50.0000 mg | ORAL_CAPSULE | Freq: Every day | ORAL | Status: DC
  Administered 2020-09-11: 50 mg via ORAL
  Filled 2020-09-11: qty 2

## 2020-09-11 MED ORDER — TRIMETHOBENZAMIDE HCL 100 MG/ML IM SOLN
200.0000 mg | Freq: Four times a day (QID) | INTRAMUSCULAR | Status: DC | PRN
  Administered 2020-09-11 (×2): 200 mg via INTRAMUSCULAR
  Filled 2020-09-11 (×3): qty 2

## 2020-09-11 MED ORDER — ASPIRIN EC 81 MG PO TBEC
81.0000 mg | DELAYED_RELEASE_TABLET | Freq: Every day | ORAL | Status: DC
  Administered 2020-09-12: 81 mg via ORAL
  Filled 2020-09-11: qty 1

## 2020-09-11 MED ORDER — PROCHLORPERAZINE MALEATE 5 MG PO TABS: 5.0000 mg | ORAL_TABLET | Freq: Four times a day (QID) | ORAL | Status: DC | PRN

## 2020-09-11 MED ORDER — ACETAMINOPHEN 325 MG PO TABS: 650.0000 mg | ORAL_TABLET | Freq: Four times a day (QID) | ORAL | Status: DC | PRN

## 2020-09-11 MED ORDER — DM-GUAIFENESIN ER 30-600 MG PO TB12
1.0000 | ORAL_TABLET | Freq: Once | ORAL | Status: AC
  Administered 2020-09-11: 1 via ORAL
  Filled 2020-09-11: qty 1

## 2020-09-11 MED ORDER — PENTAFLUOROPROP-TETRAFLUOROETH EX AERO: 1.0000 "application " | INHALATION_SPRAY | CUTANEOUS | Status: DC | PRN

## 2020-09-11 MED ORDER — TRAMADOL HCL 50 MG PO TABS
100.0000 mg | ORAL_TABLET | Freq: Four times a day (QID) | ORAL | Status: DC | PRN
  Administered 2020-09-11: 100 mg via ORAL
  Filled 2020-09-11 (×2): qty 2

## 2020-09-11 MED ORDER — LIDOCAINE-PRILOCAINE 2.5-2.5 % EX CREA
1.0000 "application " | TOPICAL_CREAM | CUTANEOUS | Status: DC | PRN
  Filled 2020-09-11: qty 5

## 2020-09-11 MED ORDER — SEVELAMER CARBONATE 800 MG PO TABS: 1600.0000 mg | ORAL_TABLET | Freq: Three times a day (TID) | ORAL | Status: DC

## 2020-09-11 MED ORDER — NEPRO/CARBSTEADY PO LIQD
237.0000 mL | Freq: Two times a day (BID) | ORAL | Status: DC
  Administered 2020-09-12: 237 mL via ORAL

## 2020-09-11 MED ORDER — METOPROLOL TARTRATE 5 MG/5ML IV SOLN: 5.0000 mg | Freq: Four times a day (QID) | INTRAVENOUS | Status: DC | PRN

## 2020-09-11 MED ORDER — TRAMADOL HCL 50 MG PO TABS
100.0000 mg | ORAL_TABLET | Freq: Four times a day (QID) | ORAL | Status: DC | PRN
  Administered 2020-09-11: 100 mg via ORAL

## 2020-09-11 MED ORDER — ACETAMINOPHEN 500 MG PO TABS: 1000.0000 mg | ORAL_TABLET | Freq: Two times a day (BID) | ORAL | Status: DC | PRN

## 2020-09-11 MED ORDER — LENALIDOMIDE 2.5 MG PO CAPS: 2.5000 mg | ORAL_CAPSULE | ORAL | Status: DC

## 2020-09-11 MED ORDER — ACYCLOVIR 400 MG PO TABS
400.0000 mg | ORAL_TABLET | Freq: Every day | ORAL | Status: DC
  Administered 2020-09-11 – 2020-09-12 (×2): 400 mg via ORAL
  Filled 2020-09-11 (×2): qty 1

## 2020-09-11 MED ORDER — ALTEPLASE 2 MG IJ SOLR: 2.0000 mg | Freq: Once | INTRAMUSCULAR | Status: DC | PRN

## 2020-09-11 NOTE — Progress Notes (Signed)
[facility] PROGRESS NOTE    [name]  KNL:[id number] DOB: 1929/12/11 DOA: 09/10/2020 PCP: Lucianne Lei, MD      Brief Narrative:  [name] is an 86 y.o. F with ESRD on HD TThS, SSS s/p PPM, MM on treatment, DM, and OSA on CPAP who presented with SOB and bloating for 1 day.  Dialysis schedule had been altered due to holiday.  Patient developed dyspnea, orthopnea, and feeling of abdominal fullness.  No other fever, vomiting, diarrhea/melena, chest pain.  In the ER, CT abdomen and pelvis was unremarkable except for cardiomegaly and small bilateral pleural effusions with edema.  URI panel negative.  Patient was admitted for extra dialysis.            Assessment & Plan:   Acute on chronic diastolic CHF Patient with orthopnea, edema on CXR, cardiomegaly and pleural effusio and dyspnea on exertion.  Underwent dialysis and orthopne improved but CXR still shows persistent bilateral opacities, favor that this is edema.    Sick sinus syndrome with pacemaker  Multiple myeloma Blood counts were stable from prior. -Continue Revlimid -Continue acyclovir -Continue COmpazine and ondansetron  Diabetes Glucoses were within normal limits. -Hold glimepiride -SS corrections   Elevated troponin Troponin low and flat, consistent with poor renal clearance and fluid overload. ECG shows paced rhythm, no change from previous.  Glaucoma -Continue eye drops  Gout -Continue allopurinol  CV disease, secondary prevention -Continue aspirin  Hip pain -Continue tramadol, Lyrica  Pressure injury sacrum, stage I, POA  Severe protein calorie malnutrition As evidenced by 15 lb weight loss in last year, severe loss of subcutaneous muscle mass and fat, temporal wasting, thenar wasting, poor PO intake and chronic illness/multiple myeloma.        Disposition: Status is:  INPATIENT  The patient will require care spanning > 2 midnights and should be moved  to inpatient because: ongoing shortness of breath with ambulating just a few feet  Dispo: The patient is from: Home              Anticipated d/c is to: Home              Anticipated d/c date is: 1 day              Patient currently is not medically stable to d/c.              MDM: The below labs and imaging reports were reviewed and summarized above.  Medication management as above.    DVT prophylaxis: heparin injection 5,000 Units Start: 09/11/20 0815  Code Status: FULL Family Communication: Son and daughter by phone    Consultants:   nephrology  Procedures:   HD on 11/27  Antimicrobials:      Culture data:              Subjective: Patient still feeling OOB with just a few steps after HD.  No fever. No sputum or cough.  No vomiting.  She has abdominal fullness with exertion.    Objective: Vitals:   09/11/20 1132 09/11/20 1209 09/11/20 1210 09/11/20 1609  BP:  113/60    Pulse: 73 75    Resp:  12    Temp: (!) 97.5 F (36.4 C)  98.7 F (37.1 C) 98.1 F (36.7 C)  TempSrc: Oral  Oral Oral  SpO2: 99% 100%    Weight: 51.2 kg     Height:        Intake/Output Summary (Last 24  hours) at 09/11/2020 1624 Last data filed at 09/11/2020 1132 Gross per 24 hour  Intake --  Output 3000 ml  Net -3000 ml   Filed Weights   09/10/20 1937 09/11/20 0742 09/11/20 1132  Weight: 53.2 kg 54 kg 51.2 kg    Examination: General appearance: thin elderly adult female, alert and in no acute distress.  Interactive, lying in bed HEENT: Anicteric, conjunctiva pink, lids and lashes normal. No nasal deformity, discharge, epistaxis.   Skin: Warm and dry.  no jaundice.  No suspicious rashes or lesions. Cardiac: RRR, nl S1-S2, SEM noted.  Capillary refill is brisk.  JVP normal.  No LE edema.  Radial pulses 2+ and symmetric. Respiratory: Normal respiratory rate and rhythm.  Dullness at bases.CTAB without rales or wheezes. Abdomen: Abdomen soft.  No focal TTP or  guarding. No ascites, distension, hepatosplenomegaly.   MSK: No deformities or effusions. Severe diffuse loss of subcutaneousmuscle mass and fat. Neuro: Awake and alert.  EOMI, moves all extremities. Speech fluent.    Psych: Sensorium intact and responding to questions, attention normal. Affect normal.  Judgment and insight appear normal.    Data Reviewed: I have personally reviewed following labs and imaging studies:  CBC: Recent Labs  Lab 09/10/20 1047 09/11/20 0828  WBC 3.5* 4.1  HGB 9.1* 8.7*  HCT 28.2* 26.6*  MCV 93.4 91.1  PLT 128* 782*   Basic Metabolic Panel: Recent Labs  Lab 09/10/20 1047 09/11/20 0828  NA 136 137  K 4.0 3.3*  CL 95* 95*  CO2 28 28  GLUCOSE 91 128*  BUN 14 18  CREATININE 3.41* 3.61*  CALCIUM 8.6* 8.4*  PHOS  --  3.8   GFR: Estimated Creatinine Clearance: 8.7 mL/min (A) (by C-G formula based on SCr of 3.61 mg/dL (H)). Liver Function Tests: Recent Labs  Lab 09/10/20 1047 09/11/20 0828  AST 34  --   ALT 21  --   ALKPHOS 62  --   BILITOT 1.1  --   PROT 5.4*  --   ALBUMIN 3.2* 3.1*   Recent Labs  Lab 09/10/20 1047  LIPASE 19   No results for input(s): AMMONIA in the last 168 hours. Coagulation Profile: No results for input(s): INR, PROTIME in the last 168 hours. Cardiac Enzymes: No results for input(s): CKTOTAL, CKMB, CKMBINDEX, TROPONINI in the last 168 hours. BNP (last 3 results) No results for input(s): PROBNP in the last 8760 hours. HbA1C: No results for input(s): HGBA1C in the last 72 hours. CBG: Recent Labs  Lab 09/10/20 2312  GLUCAP 109*   Lipid Profile: No results for input(s): CHOL, HDL, LDLCALC, TRIG, CHOLHDL, LDLDIRECT in the last 72 hours. Thyroid Function Tests: No results for input(s): TSH, T4TOTAL, FREET4, T3FREE, THYROIDAB in the last 72 hours. Anemia Panel: No results for input(s): VITAMINB12, FOLATE, FERRITIN, TIBC, IRON, RETICCTPCT in the last 72 hours. Urine analysis:    Component Value Date/Time    COLORURINE YELLOW 11/27/2019 1430   APPEARANCEUR CLEAR 11/27/2019 1430   LABSPEC 1.010 11/27/2019 1430   PHURINE 9.0 (H) 11/27/2019 1430   GLUCOSEU NEGATIVE 11/27/2019 1430   HGBUR NEGATIVE 11/27/2019 1430   BILIRUBINUR NEGATIVE 11/27/2019 1430   KETONESUR NEGATIVE 11/27/2019 1430   PROTEINUR NEGATIVE 11/27/2019 1430   UROBILINOGEN 1.0 10/05/2012 1953   NITRITE NEGATIVE 11/27/2019 1430   LEUKOCYTESUR SMALL (A) 11/27/2019 1430   Sepsis Labs: @LABRCNTIP (procalcitonin:4,lacticacidven:4)  ) Recent Results (from the past 240 hour(s))  Resp Panel by RT-PCR (Flu A&B, Covid) Nasopharyngeal Swab  Status: None   Collection Time: 09/10/20  2:52 PM   Specimen: Nasopharyngeal Swab; Nasopharyngeal(NP) swabs in vial transport medium  Result Value Ref Range Status   SARS Coronavirus 2 by RT PCR NEGATIVE NEGATIVE Final    Comment: (NOTE) SARS-CoV-2 target nucleic acids are NOT DETECTED.  The SARS-CoV-2 RNA is generally detectable in upper respiratory specimens during the acute phase of infection. The lowest concentration of SARS-CoV-2 viral copies this assay can detect is 138 copies/mL. A negative result does not preclude SARS-Cov-2 infection and should not be used as the sole basis for treatment or other patient management decisions. A negative result may occur with  improper specimen collection/handling, submission of specimen other than nasopharyngeal swab, presence of viral mutation(s) within the areas targeted by this assay, and inadequate number of viral copies(<138 copies/mL). A negative result must be combined with clinical observations, patient history, and epidemiological information. The expected result is Negative.  Fact Sheet for Patients:  EntrepreneurPulse.com.au  Fact Sheet for Healthcare Providers:  IncredibleEmployment.be  This test is no t yet approved or cleared by the [place] FDA and  has been authorized for detection  and/or diagnosis of SARS-CoV-2 by FDA under an Emergency Use Authorization (EUA). This EUA will remain  in effect (meaning this test can be used) for the duration of the COVID-19 declaration under Section 564(b)(1) of the Act, 21 U.S.C.section 360bbb-3(b)(1), unless the authorization is terminated  or revoked sooner.       Influenza A by PCR NEGATIVE NEGATIVE Final   Influenza B by PCR NEGATIVE NEGATIVE Final    Comment: (NOTE) The Xpert Xpress SARS-CoV-2/FLU/RSV plus assay is intended as an aid in the diagnosis of influenza from Nasopharyngeal swab specimens and should not be used as a sole basis for treatment. Nasal washings and aspirates are unacceptable for Xpert Xpress SARS-CoV-2/FLU/RSV testing.  Fact Sheet for Patients: EntrepreneurPulse.com.au  Fact Sheet for Healthcare Providers: IncredibleEmployment.be  This test is not yet approved or cleared by the [place] FDA and has been authorized for detection and/or diagnosis of SARS-CoV-2 by FDA under an Emergency Use Authorization (EUA). This EUA will remain in effect (meaning this test can be used) for the duration of the COVID-19 declaration under Section 564(b)(1) of the Act, 21 U.S.C. section 360bbb-3(b)(1), unless the authorization is terminated or revoked.  Performed at [facility], [place] [address], [place], [place] [address]   MRSA PCR Screening     Status: None   Collection Time: 09/10/20  7:43 PM   Specimen: Nasal Mucosa; Nasopharyngeal  Result Value Ref Range Status   MRSA by PCR NEGATIVE NEGATIVE Final    Comment:        The GeneXpert MRSA Assay (FDA approved for NASAL specimens only), is one component of a comprehensive MRSA colonization surveillance program. It is not intended to diagnose MRSA infection nor to guide or monitor treatment for MRSA infections. Performed at [facility], [place] [address], [place], [place] [address]           Radiology Studies: CT ANGIO CHEST AORTA W/CM & OR WO/CM  Result Date: 09/10/2020 CLINICAL DATA:  Abdominal pain radiating to back EXAM: CT ANGIOGRAPHY CHEST WITH CONTRAST TECHNIQUE: Multidetector CT imaging of the chest was performed using the standard protocol during bolus administration of intravenous contrast. Multiplanar CT image reconstructions and MIPs were obtained to evaluate the vascular anatomy. CONTRAST:  53m OMNIPAQUE IOHEXOL 350 MG/ML SOLN COMPARISON:  04/02/2020 FINDINGS: Cardiovascular: Satisfactory opacification of the pulmonary arteries  to the segmental level. No evidence of pulmonary embolism. Thoracic aorta is normal in caliber with atherosclerotic plaque. No evidence of dissection. Cardiomegaly. Left chest wall pacemaker with atrial and ventricular leads. No pericardial effusion. Mediastinum/Nodes: No enlarged nodes identified. Thyroid is unremarkable. Lungs/Pleura: Moderate right pleural effusion adjacent right lower lobe atelectasis. Small left pleural effusion with adjacent left lower lobe atelectasis. Mild interlobular septal thickening likely reflecting edema. Upper Abdomen: No acute abnormality. Musculoskeletal: Numerous foci of lucency reflecting known multiple myeloma. No new compression deformity. Advanced degenerative changes at the glenohumeral joints. Review of the MIP images confirms the above findings. IMPRESSION: No evidence of acute pulmonary embolism or aortic dissection. Moderate right and small left pleural effusions with adjacent bilateral lower lobe atelectasis. Mild interstitial pulmonary edema. Cardiomegaly. Electronically Signed   By: Macy Mis M.D.   On: 09/10/2020 12:07        Scheduled Meds: . Chlorhexidine Gluconate Cloth  6 each Topical Q0600  . heparin  5,000 Units Subcutaneous Q8H  . iohexol  100 mL Intravenous Once   Continuous Infusions:   LOS: 0 days    Time spent: 25 minutes    Edwin Dada, MD [facility] 09/11/2020, 4:24 PM     Please page though [place] or Epic secure chat:  For [employer], Adult nurse

## 2020-09-11 NOTE — Progress Notes (Signed)
Complained of feeling nauseous after eating lunch. MD aware. Tigan im given . Continue to monitor.

## 2020-09-11 NOTE — Procedures (Signed)
Patient seen and examined on Hemodialysis. BP 113/60   Pulse 75   Temp 98.7 F (37.1 C) (Oral)   Resp 12   Ht 5\' 5"  (1.651 m)   Wt 51.2 kg   SpO2 100%   BMI 18.78 kg/m   QB 400 mL/ min via AVF UF goal 2.5L  Tolerating treatment without complaints at this time.  Says breathing getting better but not back to baseline yet.   Madelon Lips MD [facility] pgr [phone] 12:58 PM

## 2020-09-11 NOTE — Consult Note (Signed)
[facility] Renal Consultation Note    Indication for Consultation:  Management of ESRD/hemodialysis, anemia, hypertension/volume, and secondary hyperparathyroidism. PCP:  HPI: [name] is a 86 y.o. female  with ESRD, Hx bradycardia s/p PPM, T2DM, HTN, Multiple Myeloma, and OSA who was admitted with  abdominal pain and pulmonary edema.  She presented to the ED on 11/26 after having abdominal pain and dyspnea the previous night. She had to sit in a recliner overnight due to her breathing status. Denied CP or N/V/diarrhea. No fever or chills or sick contacts. She had not missed dialysis recently. Her appetite has been poor and she has lost weight recently.  In the ED, her vitals showed moderate hypertension. She initially was satting fine on RA, but required O2 in the evening and overnight. Her labs showed Na 136, K 4, Ca 8.6, Alb 3.2, WBC 3.5, Hgb 9.1, Trop 168 -> 159. COVID/FLu were negative. She had a CTA of her chest and abdomen without PE or dissection, but with moderate B pleural effusions and numerous skeletal lucencies c/w Hx MM.   She dialyzes on TTS schedule at [facility]. She is due for dialysis today - was brought up to the unit and currently running on HD with 3L net UF goal.  Past Medical History:  Diagnosis Date  . Anemia   . Arthritis   . Benign paroxysmal positional vertigo 04/06/2010  . Benign positional vertigo   . 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 CAROTID BRUIT 02/24/2008  . Complication of anesthesia    took a long time to wake up with knee replacement  . DIABETES MELLITUS, TYPE II 02/24/2008  . ESRD (end stage renal disease) on dialysis ([place])    tues thurs sat [facility] kidney center sees [place] [facility]  . GERD (gastroesophageal reflux disease)   . Glaucoma   . Gout   . HYPERTENSION 02/24/2008  . Multiple myeloma ([place])   . Obstructive sleep apnea   . Pancytopenia ([place])   . Sick sinus syndrome ([place])   . Wears dentures   . Wears  glasses    Past Surgical History:  Procedure Laterality Date  . A/V FISTULAGRAM Right 03/08/2020   Procedure: A/V FISTULAGRAM- Right Arm;  Surgeon: Waynetta Sandy, MD;  Location: [place] [facility];  Service: Cardiovascular;  Laterality: Right;  . A/V FISTULAGRAM N/A 06/28/2020   Procedure: A/V FISTULAGRAM - Right Upper Arm;  Surgeon: Waynetta Sandy, MD;  Location: [place] [facility];  Service: Cardiovascular;  Laterality: N/A;  . ABDOMINAL HYSTERECTOMY     1980's  . AV FISTULA PLACEMENT Right 03/11/2019   Procedure: CREATION RIGHT ARM RADIOCEPHALIC ARTERIOVENOUS FISTULA;  Surgeon: Angelia Mould, MD;  Location: [place];  Service: Vascular;  Laterality: Right;  . AV FISTULA PLACEMENT Right 03/25/2020   Procedure: right arm ARTERIOVENOUS (AV) FISTULA CREATION;  Surgeon: Waynetta Sandy, MD;  Location: [place];  Service: Vascular;  Laterality: Right;  . BACK SURGERY     x 3  . BIOPSY  06/24/2019   Procedure: BIOPSY;  Surgeon: Ronald Lobo, MD;  Location: [facility];  Service: Endoscopy;;  . EP IMPLANTABLE DEVICE N/A 05/16/2016   Procedure: PPM Generator Changeout;  Surgeon: Thompson Grayer, MD;  Location: [place] [facility];  Service: Cardiovascular;  Laterality: N/A;  . ESOPHAGOGASTRODUODENOSCOPY (EGD) WITH PROPOFOL N/A 06/24/2019   Procedure: ESOPHAGOGASTRODUODENOSCOPY (EGD) WITH PROPOFOL;  Surgeon: Ronald Lobo, MD;  Location: [facility];  Service: Endoscopy;  Laterality: N/A;  .  FISTULA SUPERFICIALIZATION Right 03/11/2019   Procedure: FISTULA SUPERFICIALIZATION;  Surgeon: Angelia Mould, MD;  Location: [place];  Service: Vascular;  Laterality: Right;  . INSERTION OF DIALYSIS CATHETER Right 03/25/2020   Procedure: INSERTION OF DIALYSIS CATHETER, right internal jugular;  Surgeon: Waynetta Sandy, MD;  Location: [place];  Service: Vascular;  Laterality: Right;  . IR FLUORO GUIDE CV LINE RIGHT  03/07/2019  . IR US GUIDE VASC ACCESS RIGHT  03/07/2019   . KNEE SURGERY Right    2003  . KNEE SURGERY Left    2011  . PACEMAKER INSERTION    . PERIPHERAL VASCULAR BALLOON ANGIOPLASTY Right 06/28/2020   Procedure: PERIPHERAL VASCULAR BALLOON ANGIOPLASTY;  Surgeon: Waynetta Sandy, MD;  Location: [place] [facility];  Service: Cardiovascular;  Laterality: Right;   Family History  Problem Relation Age of Onset  . Congestive Heart Failure Mother   . Tuberculosis Mother   . Multiple myeloma Mother   . Bone cancer Father   . Arthritis Father   . Breast cancer Sister   . Colon cancer Sister   . Hypertension Sister   . Dementia Sister   . Alcohol abuse Son    Social History:  reports that she has never smoked. She has never used smokeless tobacco. She reports that she does not drink alcohol and does not use drugs.  ROS: As per HPI otherwise negative.  Physical Exam: Vitals:   09/11/20 0200 09/11/20 0300 09/11/20 0400 09/11/20 0700  BP: (!) 173/75 (!) 169/68 (!) 167/64   Pulse: 70 73 70   Resp: _0 Temp:    98.1 F (36.7 C)  TempSrc:    Oral  SpO2: 94% 98% 100%   Weight:      Height:         General: Frail, elderly woman. NAD. On nasal O2 Head: Normocephalic, atraumatic, sclera non-icteric, mucus membranes are moist. Neck: Supple without lymphadenopathy/masses. JVD not elevated. Lungs: Clear in upper lobes, + bibasilar crackles Heart: RRR with normal S1, S2. No murmurs, rubs, or gallops appreciated. Abdomen: Soft, non-tender, non-distended with normoactive bowel sounds.  Musculoskeletal:  Reduced muscle tone, moving all extremities well.  Lower extremities: No edema or ischemic changes, no open wounds. Neuro: Alert and oriented X 3. Moves all extremities spontaneously. Psych:  Responds to questions appropriately with a normal affect. Dialysis Access: RUE AVF + bruit (cannulated)  Allergies  Allergen Reactions  . Aspirin Other (See Comments)    REACTION: STOMACH ISSUES WITH DOSE HIGHER THAN 81 MG  Other  reaction(s): GI Upset (intolerance), Other (See Comments) REACTION: STOMACH ISSUES WITH DOSE HIGHER THAN 81 MG  REACTION: STOMACH ISSUES WITH DOSE HIGHER THAN 81 MG   . Hydrocodone-Acetaminophen Nausea And Vomiting  . Morphine Nausea And Vomiting  . Penicillins Rash    Patient took injection and tablets, had a reaction. She has taken amoxicillin with no reaction Has patient had a PCN reaction causing immediate rash, facial/tongue/throat swelling, SOB or lightheadedness with hypotension: Yes Has patient had a PCN reaction causing severe rash involving mucus membranes or skin necrosis: Yes Has patient had a PCN reaction that required hospitalization No Has patient had a PCN reaction occurring within the last 10 years: No If all of the above answers are "NO", then m Patient took injection and tablets, had a reaction. She has taken amoxicillin with no reaction Patient took injection and tablets, had a reaction. She has taken amoxicillin with no reaction Has patient had a  PCN reaction causing immediate rash, facial/tongue/throat swelling, SOB or lightheadedness with hypotension: Yes Has patient had a PCN reaction causing severe rash involving mucus membranes or skin necrosis: Yes Has patient had a PCN reaction that required hospitalization No Has patient had a PCN reaction occurring within the last 10 years: No If all of the above answers are "NO", then m  . Betaine Nausea And Vomiting  . Dacarbazine Nausea And Vomiting  . Ketorolac Tromethamine Itching  . Brimonidine Itching  . Diltiazem Hcl Rash  . Hydrocodone-Acetaminophen Nausea And Vomiting  . Neurontin [Gabapentin] Nausea And Vomiting  . Sulfacetamide Sodium Rash   Prior to Admission medications   Medication Sig Start Date End Date Taking? Authorizing Provider  acetaminophen (TYLENOL) 500 MG tablet Take 1,000 mg by mouth 2 (two) times daily as needed for mild pain or moderate pain.    Yes [provider]  acyclovir (ZOVIRAX)  400 MG tablet Take 1 tablet (400 mg total) by mouth daily. 04/15/20  Yes Heath Lark, MD  aspirin EC 81 MG tablet Take 81 mg by mouth daily. Swallow whole.   Yes [provider]  cholecalciferol (VITAMIN D3) 25 MCG (1000 UT) tablet Take 2,000 Units by mouth daily.   Yes [provider]  dorzolamide (TRUSOPT) 2 % ophthalmic solution Place 1 drop into the left eye 2 (two) times daily.  12/21/17  Yes [provider]  fluorometholone (FML) 0.1 % ophthalmic suspension Place 1 drop into the right eye daily.  02/01/19  Yes [provider]  glimepiride (AMARYL) 1 MG tablet Take 0.5 mg by mouth daily.  04/07/20  Yes [provider]  lactulose (CHRONULAC) 10 GM/15ML solution TAKE 15 MLS (10 G TOTAL) BY MOUTH DAILY AS NEEDED FOR MILD CONSTIPATION. Patient taking differently: Take 10 g by mouth 3 (three) times daily.  08/30/20 09/29/20 Yes Heath Lark, MD  lenalidomide (REVLIMID) 2.5 MG capsule Take 1 capsule three times a week after dialysis on Tuesday, Thursday and Saturdays 09/03/20  Yes Gorsuch, Ni, MD  lidocaine-prilocaine (EMLA) cream Apply 1 application topically as needed [facility]).  05/26/20  Yes [provider]  LUMIGAN 0.01 % SOLN Place 1 drop into the left eye at bedtime.  02/01/19  Yes [provider]  LYRICA 50 MG capsule Take 50 mg by mouth at bedtime.  06/14/15  Yes [provider]  ondansetron (ZOFRAN) 4 MG tablet TAKE 1 TABLET BY MOUTH EVERY 8 HOURS AS NEEDED FOR NAUSEA Patient taking differently: Take 4 mg by mouth every 8 (eight) hours as needed for nausea.  07/21/20  Yes Gorsuch, Ni, MD  oxyCODONE (OXY IR/ROXICODONE) 5 MG immediate release tablet Take 1 tablet (5 mg total) by mouth every 4 (four) hours as needed for severe pain. 08/04/20  Yes Gorsuch, Ni, MD  pantoprazole (PROTONIX) 40 MG tablet Take 1 tablet (40 mg total) by mouth daily as needed (acid reflux/indigestion.). 06/26/19  Yes Gorsuch, Ni, MD  polyethylene glycol (MIRALAX  / GLYCOLAX) 17 g packet Take 17 g by mouth daily.    Yes [provider]  prochlorperazine (COMPAZINE) 5 MG tablet TAKE 1 TABLET (5 MG TOTAL) BY MOUTH EVERY 6 (SIX) HOURS AS NEEDED FOR NAUSEA OR VOMITING. 05/27/20  Yes Gorsuch, Ni, MD  senna-docusate (SENOKOT-S) 8.6-50 MG tablet Take 2 tablets by mouth 2 (two) times daily. 03/11/19  Yes Nita Sells, MD  sevelamer carbonate (RENVELA) 800 MG tablet Take 1,600 mg by mouth 3 (three) times daily with meals.  04/25/19  Yes [provider]  sodium chloride (OCEAN) 0.65 % SOLN nasal spray Place 1 spray into both nostrils 4 (four) times daily as needed for congestion.    Yes [provider]  traMADol (ULTRAM) 50 MG tablet Take 2 tablets (100 mg total) by mouth every 6 (six) hours as needed. Patient taking differently: Take 100 mg by mouth every 6 (six) hours as needed for moderate pain.  08/25/20  Yes Gorsuch, Ni, MD  allopurinol (ZYLOPRIM) 100 MG tablet Take 0.5 tablets (50 mg total) by mouth daily. Patient taking differently: Take 50 mg by mouth at bedtime.  03/12/19   Nita Sells, MD  Methoxy PEG-Epoetin Beta (MIRCERA IJ) Mircera 07/01/20 06/30/21  [provider]   Current Facility-Administered Medications  Medication Dose Route Frequency Provider Last Rate Last Admin  . 0.9 %  sodium chloride infusion  100 mL Intravenous PRN Loren Racer, PA-C      . 0.9 %  sodium chloride infusion  100 mL Intravenous PRN Loren Racer, PA-C      . acetaminophen (TYLENOL) tablet 650 mg  650 mg Oral Q6H PRN Danford, Suann Larry, MD       Or  . acetaminophen (TYLENOL) suppository 650 mg  650 mg Rectal Q6H PRN Danford, Suann Larry, MD      . alteplase (CATHFLO ACTIVASE) injection 2 mg  2 mg Intracatheter Once PRN Loren Racer, PA-C      . Chlorhexidine Gluconate Cloth 2 % PADS 6 each  6 each Topical Q0600 Loren Racer, PA-C   6 each at 09/11/20 0430  . heparin injection 1,000 Units  1,000 Units  Dialysis PRN Loren Racer, PA-C      . heparin injection 5,000 Units  5,000 Units Subcutaneous Q8H Danford, Suann Larry, MD      . iohexol (OMNIPAQUE) 350 MG/ML injection 100 mL  100 mL Intravenous Once Pattricia Boss, MD      . lidocaine (PF) (XYLOCAINE) 1 % injection 5 mL  5 mL Intradermal PRN Loren Racer, PA-C      . lidocaine-prilocaine (EMLA) cream 1 application  1 application Topical PRN Loren Racer, PA-C      . metoprolol tartrate (LOPRESSOR) injection 5 mg  5 mg Intravenous Q6H PRN Danford, Suann Larry, MD      . pentafluoroprop-tetrafluoroeth (GEBAUERS) aerosol 1 application  1 application Topical PRN Loren Racer, PA-C      . traMADol Veatrice Bourbon) tablet 100 mg  100 mg Oral Q6H PRN Chotiner, Yevonne Aline, MD   100 mg at 09/11/20 0659  . trimethobenzamide (TIGAN) injection 200 mg  200 mg Intramuscular Q6H PRN Chotiner, Yevonne Aline, MD   200 mg at 09/11/20 0430   Labs: Basic Metabolic Panel: Recent Labs  Lab 09/10/20 1047  NA 136  K 4.0  CL 95*  CO2 28  GLUCOSE 91  BUN 14  CREATININE 3.41*  CALCIUM 8.6*   Liver Function Tests: Recent Labs  Lab 09/10/20 1047  AST 34  ALT 21  ALKPHOS 62  BILITOT 1.1  PROT 5.4*  ALBUMIN 3.2*   Recent Labs  Lab 09/10/20 1047  LIPASE 19   CBC: Recent Labs  Lab 09/10/20 1047  WBC 3.5*  HGB 9.1*  HCT 28.2*  MCV 93.4  PLT 128*   CBG: Recent Labs  Lab 09/10/20 2312  GLUCAP 109*   Studies/Results: CT ANGIO CHEST AORTA W/CM & OR WO/CM  Result Date: 09/10/2020 CLINICAL DATA:  Abdominal pain radiating to back EXAM:  CT ANGIOGRAPHY CHEST WITH CONTRAST TECHNIQUE: Multidetector CT imaging of the chest was performed using the standard protocol during bolus administration of intravenous contrast. Multiplanar CT image reconstructions and MIPs were obtained to evaluate the vascular anatomy. CONTRAST:  61m OMNIPAQUE IOHEXOL 350 MG/ML SOLN COMPARISON:  04/02/2020 FINDINGS: Cardiovascular: Satisfactory opacification of  the pulmonary arteries to the segmental level. No evidence of pulmonary embolism. Thoracic aorta is normal in caliber with atherosclerotic plaque. No evidence of dissection. Cardiomegaly. Left chest wall pacemaker with atrial and ventricular leads. No pericardial effusion. Mediastinum/Nodes: No enlarged nodes identified. Thyroid is unremarkable. Lungs/Pleura: Moderate right pleural effusion adjacent right lower lobe atelectasis. Small left pleural effusion with adjacent left lower lobe atelectasis. Mild interlobular septal thickening likely reflecting edema. Upper Abdomen: No acute abnormality. Musculoskeletal: Numerous foci of lucency reflecting known multiple myeloma. No new compression deformity. Advanced degenerative changes at the glenohumeral joints. Review of the MIP images confirms the above findings. IMPRESSION: No evidence of acute pulmonary embolism or aortic dissection. Moderate right and small left pleural effusions with adjacent bilateral lower lobe atelectasis. Mild interstitial pulmonary edema. Cardiomegaly. Electronically Signed   By: PMacy MisM.D.   On: 09/10/2020 12:07   [facility], EDW 55.5kg, 2K/2Ca, AVF, heparin 2000 bolus - Hectoral 120m IV q HD - Mircera 7517mIV q 4 weeks (last 11/24)  Assessment/Plan: 1.  Dyspnea/Pulm edema/Bilateral pleural effusions: S/p Lasix 109m84m/26, currently on HD with 3L net UF goal and tolerating, suspect she has true body weight loss and needs dry weight lowered. Consider thoracentesis if not improving with HD. 2. Abdominal pain: Improved today, CT unrevealing. 3. Elevated troponin: Suspect d/t #1. Per primary. 4.  ESRD: Usual TTS sched - HD now, as above. 5.  Hypertension/volume: BP high with volume on imaging - she is below OP dry weight, will challenge/lower further here. 6.  Anemia of ESRD: Hgb low - ESA just given as outpatient, follow for now. 7.  Metabolic bone disease: Ca ok, Phos pending. Restart  home binders prn. 8.  Nutrition: Alb low, adding protein supplements. 9. Hx pacemaker/Sick sinus syndrome 10. Multiple Myeloma: Continue home meds.  KatiVeneta Penton-C 09/11/2020, 8:19 AM  [employer]

## 2020-09-11 NOTE — Care Management Obs Status (Signed)
[place] NOTIFICATION   Patient Details  Name: RIONNA FELTES MRN: [id number] Date of Birth: 1930-04-02   Medicare Observation Status Notification Given:  Yes    Carles Collet, RN 09/11/2020, 3:10 PM

## 2020-09-11 NOTE — Progress Notes (Signed)
Back from hemodialysis by bed awake and alert. Claimed to feel much better.

## 2020-09-11 NOTE — Progress Notes (Signed)
Transported to hemodialyses by bed stable.

## 2020-09-11 NOTE — Progress Notes (Signed)
Md made aware about pt's bad reaction from dilaudid  last night.

## 2020-09-11 NOTE — Progress Notes (Signed)
Flutter valve given and able to use it , tolerated well.

## 2020-09-11 NOTE — Progress Notes (Signed)
Started ambulation along the hallway with walker when started complaining of shortness of breath and abdominal discomfort after  few steps, on room air. Sat- 98 %.Marland Kitchen  assisted back to bed. Patient claimed that she does not want to go home today as planned. MD aware with order.

## 2020-09-12 DIAGNOSIS — J810 Acute pulmonary edema: Secondary | ICD-10-CM | POA: Diagnosis not present

## 2020-09-12 LAB — CBC
HCT: 26.8 % — ABNORMAL LOW (ref 36.0–46.0)
Hemoglobin: 8.8 g/dL — ABNORMAL LOW (ref 12.0–15.0)
MCH: 29.3 pg (ref 26.0–34.0)
MCHC: 32.8 g/dL (ref 30.0–36.0)
MCV: 89.3 fL (ref 80.0–100.0)
Platelets: 149 10*3/uL — ABNORMAL LOW (ref 150–400)
RBC: 3 MIL/uL — ABNORMAL LOW (ref 3.87–5.11)
RDW: 15.1 % (ref 11.5–15.5)
WBC: 4.4 10*3/uL (ref 4.0–10.5)
nRBC: 0 % (ref 0.0–0.2)

## 2020-09-12 LAB — RENAL FUNCTION PANEL
Albumin: 3.2 g/dL — ABNORMAL LOW (ref 3.5–5.0)
Anion gap: 11 (ref 5–15)
BUN: 9 mg/dL (ref 8–23)
CO2: 29 mmol/L (ref 22–32)
Calcium: 8.3 mg/dL — ABNORMAL LOW (ref 8.9–10.3)
Chloride: 96 mmol/L — ABNORMAL LOW (ref 98–111)
Creatinine, Ser: 2.64 mg/dL — ABNORMAL HIGH (ref 0.44–1.00)
GFR, Estimated: 17 mL/min — ABNORMAL LOW (ref >60)
Glucose, Bld: 99 mg/dL (ref 70–99)
Phosphorus: 2.2 mg/dL — ABNORMAL LOW (ref 2.5–4.6)
Potassium: 3 mmol/L — ABNORMAL LOW (ref 3.5–5.1)
Sodium: 136 mmol/L (ref 135–145)

## 2020-09-12 MED ORDER — TRAMADOL HCL 50 MG PO TABS
50.0000 mg | ORAL_TABLET | Freq: Four times a day (QID) | ORAL | Status: DC | PRN
  Administered 2020-09-12: 50 mg via ORAL
  Filled 2020-09-12: qty 1

## 2020-09-12 NOTE — Evaluation (Signed)
Physical Therapy Evaluation and Discharge Patient Details Name: [name] MRN: [id number] DOB: 07/03/1930 Today's Date: 09/12/2020   History of Present Illness  Pt is a 86 y/o F presenting to 11/26 on for abdominal pain and SOB. PMH includes anemia, arthritis, DM II, acute on chronic CHF, multiple myeloma, ESRD, gout, and glaucoma.  Clinical Impression   Patient evaluated by Physical Therapy with no further acute PT needs identified. All education has been completed and the patient has no further questions. Patient has been using RW for some time due to left hip pain. She demonstrated proper use. Required min guard assist due to dizziness from pain medication and feeling woozy and a bit nauseated.  All VSS per monitor. Patient is safe to return home with her son as her caregiver (as PTA). PT is signing off. Thank you for this referral.      Follow Up Recommendations No PT follow up;Supervision for mobility/OOB    Equipment Recommendations  None recommended by PT    Recommendations for Other Services       Precautions / Restrictions Precautions Precautions: Fall      Mobility  Bed Mobility                    Transfers Overall transfer level: Needs assistance Equipment used: Rolling walker (2 wheeled) Transfers: Sit to/from Stand Sit to Stand: Min guard         General transfer comment: from recliner and std toilet with grab bar  Ambulation/Gait Ambulation/Gait assistance: Supervision Gait Distance (Feet): 80 Feet Assistive device: Rolling walker (2 wheeled) Gait Pattern/deviations: Step-through pattern;Decreased stride length;Trunk flexed     General Gait Details: slow, cautious  Stairs            Wheelchair Mobility    Modified Rankin (Stroke Patients Only)       Balance Overall balance assessment: Modified Independent (uses RW at all times at home)                                           Pertinent Vitals/Pain  Pain Assessment: 0-10 Pain Score: 6  Pain Location: left hip and back Pain Descriptors / Indicators: Aching;Discomfort Pain Intervention(s): Limited activity within patient's tolerance;Monitored during session;Premedicated before session    Home Living Family/patient expects to be discharged to:: Private residence Living Arrangements: Spouse/significant other;Children (son is main caregiver for her and 64 yo husband) Available Help at Discharge: Family;Available 24 hours/day Type of Home: House Home Access: Stairs to enter Entrance Stairs-Rails: None Entrance Stairs-Number of Steps: 1+2+1 (one into laundry area; 2 up to kitchen; 1 kitchen to [employer]) Home Layout: Two level;Able to live on main level with bedroom/bathroom;Bed/bath upstairs Home Equipment: Walker - 2 wheels;Walker - 4 wheels;Shower seat;Grab bars - tub/shower;Hand held shower head;Wheelchair - manual;Bedside commode      Prior Function Level of Independence: Needs assistance   Gait / Transfers Assistance Needed: uses RW in home; uses rollator to go into dialysis  ADL's / Homemaking Assistance Needed: always someone there as she gets in/out of shower        Hand Dominance        Extremity/Trunk Assessment   Upper Extremity Assessment Upper Extremity Assessment: Overall WFL for tasks assessed    Lower Extremity Assessment Lower Extremity Assessment: Overall WFL for tasks assessed    Cervical / Trunk Assessment Cervical / Trunk  Assessment: Kyphotic  Communication   Communication: No difficulties  Cognition Arousal/Alertness: Awake/alert Behavior During Therapy: WFL for tasks assessed/performed Overall Cognitive Status: Within Functional Limits for tasks assessed                                        General Comments      Exercises     Assessment/Plan    PT Assessment Patent does not need any further PT services  PT Problem List         PT Treatment Interventions      PT  Goals (Current goals can be found in the Care Plan section)  Acute Rehab PT Goals Patient Stated Goal: return home today PT Goal Formulation: All assessment and education complete, DC therapy    Frequency     Barriers to discharge        Co-evaluation               AM-PAC PT "6 Clicks" Mobility  Outcome Measure Help needed turning from your back to your side while in a flat bed without using bedrails?: None Help needed moving from lying on your back to sitting on the side of a flat bed without using bedrails?: None Help needed moving to and from a bed to a chair (including a wheelchair)?: None Help needed standing up from a chair using your arms (e.g., wheelchair or bedside chair)?: None Help needed to walk in hospital room?: None Help needed climbing 3-5 steps with a railing? : A Little 6 Click Score: 23    End of Session Equipment Utilized During Treatment: Gait belt Activity Tolerance: Patient tolerated treatment well (HR 80s; sats >92% on room air) Patient left: in chair;with call bell/phone within reach Nurse Communication: Mobility status;Other (comment) (no PT or DME needs) PT Visit Diagnosis: Difficulty in walking, not elsewhere classified (R26.2);Pain Pain - Right/Left: Left Pain - part of body: Hip    Time: 1112-1150 PT Time Calculation (min) (ACUTE ONLY): 38 min   Charges:   PT Evaluation $PT Eval Low Complexity: 1 Low PT Treatments $Gait Training: 23-37 mins         Arby Barrette, PT Pager [phone]   Rexanne Mano 09/12/2020, 12:00 PM

## 2020-09-12 NOTE — Progress Notes (Signed)
  [facility] Progress Note   Assessment/ Plan:   Dialysis Orders:  TTS at [facility] 4:15hr, 350/800, EDW 55.5kg, 2K/2Ca, AVF, heparin 2000 bolus - Hectoral 16mcg IV q HD - Mircera 85mcg IV q 4 weeks (last 11/24)  Assessment/Plan: 1.  Dyspnea/Pulm edema/Bilateral pleural effusions: S/p Lasix $RemoveB'60mg'eLhKCXdP$  11/26, HD 11/27 with 2.5L off.  Post-weight 5.12 kg. 2. Abdominal pain: ImprovedCT unrevealing. 3. Elevated troponin: Suspect d/t #1. Per primary. 4.  ESRD: Usual TTS sched - HD yesterday 11/27.  No indication for today.  No O2 requirement.  Discussed further challenging EDW as OP to establish EDW. Discussed fluid restriction- eating a lot of ice, discussed frozen grapes/ mouthwash/ sour lemon candy.  Next HD planned for Tuesday.  Instructed her to call her center in the AM if she feels like she needs an extra OP rx.  She is agreeable to plan. 5.  Hypertension/volume: BP high with volume on imaging - she is below OP dry weight, will challenge/lower further here. 6.  Anemia of ESRD: Hgb low - ESA just given as outpatient, follow for now. 7.  Metabolic bone disease: Ca ok, Phos pending. Restart home binders prn. 8.  Nutrition: Alb low, adding protein supplements. 9. Hx pacemaker/Sick sinus syndrome 10. Multiple Myeloma: Continue home meds. 11. Dispo: OK to d/c from renal perspective   Subjective:    Seen in room.  Sitting up in bed off O2.  Eating breakfast.  No complaints. HD yesterday with    Objective:   BP (!) 132/57 (BP Location: Left Arm)   Pulse 78   Temp 98.2 F (36.8 C) (Oral)   Resp 14   Ht $R'5\' 5"'iU$  (1.651 m)   Wt 51.2 kg   SpO2 95%   BMI 18.78 kg/m   Physical Exam: Gen: NAD, sitting up in bed CVS: RRR Resp: faint bibasilar crackles Abd: soft Ext: no LE edema ACCESS: RUE AVF + T/B  Labs: BMET Recent Labs  Lab 09/10/20 1047 09/11/20 0828 09/12/20 0048  NA 136 137 136  K 4.0 3.3* 3.0*  CL 95* 95* 96*  CO2 $Re'28 28 29  'nbe$ GLUCOSE 91 128* 99  BUN $Re'14 18 9   'VZe$ CREATININE 3.41* 3.61* 2.64*  CALCIUM 8.6* 8.4* 8.3*  PHOS  --  3.8 2.2*   CBC Recent Labs  Lab 09/10/20 1047 09/11/20 0828 09/12/20 0048  WBC 3.5* 4.1 4.4  HGB 9.1* 8.7* 8.8*  HCT 28.2* 26.6* 26.8*  MCV 93.4 91.1 89.3  PLT 128* 146* 149*      Medications:    . acyclovir  400 mg Oral Daily  . allopurinol  50 mg Oral QHS  . aspirin EC  81 mg Oral Daily  . Chlorhexidine Gluconate Cloth  6 each Topical Q0600  . dorzolamide  1 drop Left Eye BID  . feeding supplement (NEPRO CARB STEADY)  237 mL Oral BID BM  . fluorometholone  1 drop Right Eye Daily  . heparin  5,000 Units Subcutaneous Q8H  . iohexol  100 mL Intravenous Once  . lactulose  10 g Oral TID  . latanoprost  1 drop Left Eye QHS  . pregabalin  50 mg Oral QHS  . sevelamer carbonate  1,600 mg Oral TID WC     Madelon Lips, MD 09/12/2020, 10:15 AM

## 2020-09-12 NOTE — Discharge Summary (Signed)
Physician Discharge Summary  [name] UVO:[id number] DOB: 08-23-1930 DOA: 09/10/2020  PCP: [name] Lei, MD  Admit date: 09/10/2020 Discharge date: 09/12/2020  Admitted From: Home  Disposition:  Home   Recommendations for Outpatient Follow-up:  1. Follow up with PCP [name] in 1 week   Home Health: None  Equipment/Devices: None  Discharge Condition: Fair  CODE STATUS: FULL Diet recommendation: Renal, diabetic  Brief/Interim Summary: [name] is an 86 y.o. F with ESRD on HD TThS, SSS s/p PPM, MM on treatment, DM, and OSA on CPAP who presented with SOB and bloating for 1 day.  Dialysis schedule had been altered due to holiday.  Patient developed dyspnea, orthopnea, and feeling of abdominal fullness.  No other fever, vomiting, diarrhea/melena, chest pain.  In the ER, CT abdomen and pelvis was unremarkable except for cardiomegaly and small bilateral pleural effusions with edema.  URI panel negative.  Patient was admitted for extra dialysis.     PRINCIPAL HOSPITAL DIAGNOSIS: Acute on chronic diastolic CHF due to fluid overload from renal failure    Discharge Diagnoses:   Acute on chronic diastolic CHF due to fluid overload from renal failure Patient with orthopnea, edema on CXR, cardiomegaly and pleural effusion and dyspnea on exertion.  Admitted and underwent dialysis to below outpatient dry weight.  After this session, she remained significantly symptomatic initially, and further imaging was obtained that showed persistent edema, but overnight, her orthopnea and dyspnea improved, and Nephrology recommended deferring further inpatient dialysis.   No suspicion of pneumonia/infection.  Nephrology may further challenge her outpatient dry weight, as she appears to be losing weight due to her chronic Revlimid associated nausea.  Will call her center tomorrow if needs further outpatient HD.   Sick sinus syndrome with pacemaker  Multiple myeloma Revlimid  associated nausea Blood counts were stable from prior.  Nausea, persistent and chronic and stable over last 6 months, was her primary symptom of concern to her.     Diabetes  Pressure injury sacrum, stage I, POA  Severe protein calorie malnutrition As evidenced by 15 lb weight loss in last year, severe loss of subcutaneous muscle mass and fat, temporal wasting, thenar wasting, poor PO intake and chronic illness/multiple myeloma.        Discharge Instructions  Discharge Instructions    Discharge instructions   Complete by: As directed    From [name]: You were admitted with fluid overload This was fixed with an extra session of dialysis.   I am glad that you are feeling back to your normal That symptom of waking up at night and feeling out of breath until you sit on the edge of the bed is a symptom of fluid overload, like we talked about (paroxysmal nocturnal dyspnea)  You should go to your regularly scheduled dialysis Resume your home medicine regimen You clearly are able to manage a detailed medicine regimen with accuracy and excellent awareness of how the medicines affect you.  Few people are able to do this, it is very admirable.   Increase activity slowly   Complete by: As directed      Allergies as of 09/12/2020      Reactions   Aspirin Other (See Comments)   REACTION: STOMACH ISSUES WITH DOSE HIGHER THAN 81 MG  Other reaction(s): GI Upset (intolerance), Other (See Comments) REACTION: STOMACH ISSUES WITH DOSE HIGHER THAN 81 MG  REACTION: STOMACH ISSUES WITH DOSE HIGHER THAN 81 MG    Hydrocodone-acetaminophen Nausea And Vomiting   Morphine  Nausea And Vomiting   Penicillins Rash   Patient took injection and tablets, had a reaction. She has taken amoxicillin with no reaction Has patient had a PCN reaction causing immediate rash, facial/tongue/throat swelling, SOB or lightheadedness with hypotension: Yes Has patient had a PCN reaction causing severe rash  involving mucus membranes or skin necrosis: Yes Has patient had a PCN reaction that required hospitalization No Has patient had a PCN reaction occurring within the last 10 years: No If all of the above answers are "NO", then m Patient took injection and tablets, had a reaction. She has taken amoxicillin with no reaction Patient took injection and tablets, had a reaction. She has taken amoxicillin with no reaction Has patient had a PCN reaction causing immediate rash, facial/tongue/throat swelling, SOB or lightheadedness with hypotension: Yes Has patient had a PCN reaction causing severe rash involving mucus membranes or skin necrosis: Yes Has patient had a PCN reaction that required hospitalization No Has patient had a PCN reaction occurring within the last 10 years: No If all of the above answers are "NO", then m   Betaine Nausea And Vomiting   Dacarbazine Nausea And Vomiting   Ketorolac Tromethamine Itching   Brimonidine Itching   Diltiazem Hcl Rash   Hydrocodone-acetaminophen Nausea And Vomiting   Neurontin [gabapentin] Nausea And Vomiting   Sulfacetamide Sodium Rash      Medication List    TAKE these medications   acetaminophen 500 MG tablet Commonly known as: TYLENOL Take 1,000 mg by mouth 2 (two) times daily as needed for mild pain or moderate pain.   acyclovir 400 MG tablet Commonly known as: ZOVIRAX Take 1 tablet (400 mg total) by mouth daily.   allopurinol 100 MG tablet Commonly known as: ZYLOPRIM Take 0.5 tablets (50 mg total) by mouth daily. What changed: when to take this   aspirin EC 81 MG tablet Take 81 mg by mouth daily. Swallow whole.   cholecalciferol 25 MCG (1000 UNIT) tablet Commonly known as: VITAMIN D3 Take 2,000 Units by mouth daily.   dorzolamide 2 % ophthalmic solution Commonly known as: TRUSOPT Place 1 drop into the left eye 2 (two) times daily.   fluorometholone 0.1 % ophthalmic suspension Commonly known as: FML Place 1 drop into the right  eye daily.   glimepiride 1 MG tablet Commonly known as: AMARYL Take 0.5 mg by mouth daily.   lactulose 10 GM/15ML solution Commonly known as: CHRONULAC TAKE 15 MLS (10 G TOTAL) BY MOUTH DAILY AS NEEDED FOR MILD CONSTIPATION. What changed: when to take this   lenalidomide 2.5 MG capsule Commonly known as: REVLIMID Take 1 capsule three times a week after dialysis on Tuesday, Thursday and Saturdays   lidocaine-prilocaine cream Commonly known as: EMLA Apply 1 application topically as needed [facility]).   Lumigan 0.01 % Soln Generic drug: bimatoprost Place 1 drop into the left eye at bedtime.   Lyrica 50 MG capsule Generic drug: pregabalin Take 50 mg by mouth at bedtime.   MIRCERA IJ Mircera   ondansetron 4 MG tablet Commonly known as: ZOFRAN TAKE 1 TABLET BY MOUTH EVERY 8 HOURS AS NEEDED FOR NAUSEA What changed:   reasons to take this  additional instructions   oxyCODONE 5 MG immediate release tablet Commonly known as: Oxy IR/ROXICODONE Take 1 tablet (5 mg total) by mouth every 4 (four) hours as needed for severe pain.   pantoprazole 40 MG tablet Commonly known as: PROTONIX Take 1 tablet (40 mg total) by mouth daily as needed (acid  reflux/indigestion.).   polyethylene glycol 17 g packet Commonly known as: MIRALAX / GLYCOLAX Take 17 g by mouth daily.   prochlorperazine 5 MG tablet Commonly known as: COMPAZINE TAKE 1 TABLET (5 MG TOTAL) BY MOUTH EVERY 6 (SIX) HOURS AS NEEDED FOR NAUSEA OR VOMITING.   senna-docusate 8.6-50 MG tablet Commonly known as: Senokot-S Take 2 tablets by mouth 2 (two) times daily.   sevelamer carbonate 800 MG tablet Commonly known as: RENVELA Take 1,600 mg by mouth 3 (three) times daily with meals.   sodium chloride 0.65 % Soln nasal spray Commonly known as: OCEAN Place 1 spray into both nostrils 4 (four) times daily as needed for congestion.   traMADol 50 MG tablet Commonly known as: Ultram Take 2 tablets (100 mg total) by mouth  every 6 (six) hours as needed. What changed: reasons to take this       Allergies  Allergen Reactions  . Aspirin Other (See Comments)    REACTION: STOMACH ISSUES WITH DOSE HIGHER THAN 81 MG  Other reaction(s): GI Upset (intolerance), Other (See Comments) REACTION: STOMACH ISSUES WITH DOSE HIGHER THAN 81 MG  REACTION: STOMACH ISSUES WITH DOSE HIGHER THAN 81 MG   . Hydrocodone-Acetaminophen Nausea And Vomiting  . Morphine Nausea And Vomiting  . Penicillins Rash    Patient took injection and tablets, had a reaction. She has taken amoxicillin with no reaction Has patient had a PCN reaction causing immediate rash, facial/tongue/throat swelling, SOB or lightheadedness with hypotension: Yes Has patient had a PCN reaction causing severe rash involving mucus membranes or skin necrosis: Yes Has patient had a PCN reaction that required hospitalization No Has patient had a PCN reaction occurring within the last 10 years: No If all of the above answers are "NO", then m Patient took injection and tablets, had a reaction. She has taken amoxicillin with no reaction Patient took injection and tablets, had a reaction. She has taken amoxicillin with no reaction Has patient had a PCN reaction causing immediate rash, facial/tongue/throat swelling, SOB or lightheadedness with hypotension: Yes Has patient had a PCN reaction causing severe rash involving mucus membranes or skin necrosis: Yes Has patient had a PCN reaction that required hospitalization No Has patient had a PCN reaction occurring within the last 10 years: No If all of the above answers are "NO", then m  . Betaine Nausea And Vomiting  . Dacarbazine Nausea And Vomiting  . Ketorolac Tromethamine Itching  . Brimonidine Itching  . Diltiazem Hcl Rash  . Hydrocodone-Acetaminophen Nausea And Vomiting  . Neurontin [Gabapentin] Nausea And Vomiting  . Sulfacetamide Sodium Rash    Consultations:  Nephrology   Procedures/Studies: DG Chest 2  View  Result Date: 09/11/2020 CLINICAL DATA:  Dyspnea. EXAM: CHEST - 2 VIEW COMPARISON:  April 02, 2020 FINDINGS: There is a dual lead AICD. Decreased lung volumes are seen which is likely, in part, secondary to the degree of patient inspiration. Mild areas of atelectasis and/or early infiltrate are seen within the bilateral lung bases. There is a small right pleural effusion. No pneumothorax is seen. The cardiac silhouette is mildly enlarged. There is marked severity calcification of the aortic arch. Marked severity degenerative changes seen involving the bilateral shoulders and thoracic spine. IMPRESSION: 1. Mild bibasilar atelectasis and/or early infiltrate. 2. Small right pleural effusion. Electronically Signed   By: Virgina Norfolk M.D.   On: 09/11/2020 16:52   CT ANGIO CHEST AORTA W/CM & OR WO/CM  Result Date: 09/10/2020 CLINICAL DATA:  Abdominal pain radiating to  back EXAM: CT ANGIOGRAPHY CHEST WITH CONTRAST TECHNIQUE: Multidetector CT imaging of the chest was performed using the standard protocol during bolus administration of intravenous contrast. Multiplanar CT image reconstructions and MIPs were obtained to evaluate the vascular anatomy. CONTRAST:  24m OMNIPAQUE IOHEXOL 350 MG/ML SOLN COMPARISON:  04/02/2020 FINDINGS: Cardiovascular: Satisfactory opacification of the pulmonary arteries to the segmental level. No evidence of pulmonary embolism. Thoracic aorta is normal in caliber with atherosclerotic plaque. No evidence of dissection. Cardiomegaly. Left chest wall pacemaker with atrial and ventricular leads. No pericardial effusion. Mediastinum/Nodes: No enlarged nodes identified. Thyroid is unremarkable. Lungs/Pleura: Moderate right pleural effusion adjacent right lower lobe atelectasis. Small left pleural effusion with adjacent left lower lobe atelectasis. Mild interlobular septal thickening likely reflecting edema. Upper Abdomen: No acute abnormality. Musculoskeletal: Numerous foci of lucency  reflecting known multiple myeloma. No new compression deformity. Advanced degenerative changes at the glenohumeral joints. Review of the MIP images confirms the above findings. IMPRESSION: No evidence of acute pulmonary embolism or aortic dissection. Moderate right and small left pleural effusions with adjacent bilateral lower lobe atelectasis. Mild interstitial pulmonary edema. Cardiomegaly. Electronically Signed   By: PMacy MisM.D.   On: 09/10/2020 12:07       Subjective: Feeling well.  Some nausea, but similar to baseline.  Discharge Exam: Vitals:   09/12/20 0900 09/12/20 1217  BP: (!) 128/52   Pulse: 81 80  Resp: 11 16  Temp:  97.6 F (36.4 C)  SpO2:  96%   Vitals:   09/12/20 0734 09/12/20 0819 09/12/20 0900 09/12/20 1217  BP:   (!) 128/52   Pulse: 89 78 81 80  Resp: _0 Temp: 98.2 F (36.8 C)   97.6 F (36.4 C)  TempSrc: Oral   Oral  SpO2: 95%   96%  Weight:      Height:        General: Pt is alert, awake, not in acute distress Cardiovascular: RRR, nl S1-S2, no murmurs appreciated.   No LE edema.   Respiratory: Normal respiratory rate and rhythm.  CTAB without rales or wheezes. Abdominal: Abdomen soft and non-tender.  No distension or HSM.   Neuro/Psych: Strength symmetric in upper and lower extremities.  Judgment and insight appear normal.   The results of significant diagnostics from this hospitalization (including imaging, microbiology, ancillary and laboratory) are listed below for reference.     Microbiology: Recent Results (from the past 240 hour(s))  Resp Panel by RT-PCR (Flu A&B, Covid) Nasopharyngeal Swab     Status: None   Collection Time: 09/10/20  2:52 PM   Specimen: Nasopharyngeal Swab; Nasopharyngeal(NP) swabs in vial transport medium  Result Value Ref Range Status   SARS Coronavirus 2 by RT PCR NEGATIVE NEGATIVE Final    Comment: (NOTE) SARS-CoV-2 target nucleic acids are NOT DETECTED.  The SARS-CoV-2 RNA is generally detectable  in upper respiratory specimens during the acute phase of infection. The lowest concentration of SARS-CoV-2 viral copies this assay can detect is 138 copies/mL. A negative result does not preclude SARS-Cov-2 infection and should not be used as the sole basis for treatment or other patient management decisions. A negative result may occur with  improper specimen collection/handling, submission of specimen other than nasopharyngeal swab, presence of viral mutation(s) within the areas targeted by this assay, and inadequate number of viral copies(<138 copies/mL). A negative result must be combined with clinical observations, patient history, and epidemiological information. The expected result is Negative.  Fact Sheet for Patients:  EntrepreneurPulse.com.au  Fact Sheet for Healthcare Providers:  IncredibleEmployment.be  This test is no t yet approved or cleared by the [place] FDA and  has been authorized for detection and/or diagnosis of SARS-CoV-2 by FDA under an Emergency Use Authorization (EUA). This EUA will remain  in effect (meaning this test can be used) for the duration of the COVID-19 declaration under Section 564(b)(1) of the Act, 21 U.S.C.section 360bbb-3(b)(1), unless the authorization is terminated  or revoked sooner.       Influenza A by PCR NEGATIVE NEGATIVE Final   Influenza B by PCR NEGATIVE NEGATIVE Final    Comment: (NOTE) The Xpert Xpress SARS-CoV-2/FLU/RSV plus assay is intended as an aid in the diagnosis of influenza from Nasopharyngeal swab specimens and should not be used as a sole basis for treatment. Nasal washings and aspirates are unacceptable for Xpert Xpress SARS-CoV-2/FLU/RSV testing.  Fact Sheet for Patients: EntrepreneurPulse.com.au  Fact Sheet for Healthcare Providers: IncredibleEmployment.be  This test is not yet approved or cleared by the [place] FDA and has  been authorized for detection and/or diagnosis of SARS-CoV-2 by FDA under an Emergency Use Authorization (EUA). This EUA will remain in effect (meaning this test can be used) for the duration of the COVID-19 declaration under Section 564(b)(1) of the Act, 21 U.S.C. section 360bbb-3(b)(1), unless the authorization is terminated or revoked.  Performed at [facility], [place] [address] [place], [place] [address]   MRSA PCR Screening     Status: None   Collection Time: 09/10/20  7:43 PM   Specimen: Nasal Mucosa; Nasopharyngeal  Result Value Ref Range Status   MRSA by PCR NEGATIVE NEGATIVE Final    Comment:        The GeneXpert MRSA Assay (FDA approved for NASAL specimens only), is one component of a comprehensive MRSA colonization surveillance program. It is not intended to diagnose MRSA infection nor to guide or monitor treatment for MRSA infections. Performed at [facility], [place] [address] [place], [place] [address]      Labs: BNP (last 3 results) Recent Labs    04/02/20 0202  BNP 856.3*   Basic Metabolic Panel: Recent Labs  Lab 09/10/20 1047 09/11/20 0828 09/12/20 0048  NA 136 137 136  K 4.0 3.3* 3.0*  CL 95* 95* 96*  CO2 _0 GLUCOSE 91 128* 99  BUN _1 CREATININE 3.41* 3.61* 2.64*  CALCIUM 8.6* 8.4* 8.3*  PHOS  --  3.8 2.2*   Liver Function Tests: Recent Labs  Lab 09/10/20 1047 09/11/20 0828 09/12/20 0048  AST 34  --   --   ALT 21  --   --   ALKPHOS 62  --   --   BILITOT 1.1  --   --   PROT 5.4*  --   --   ALBUMIN 3.2* 3.1* 3.2*   Recent Labs  Lab 09/10/20 1047  LIPASE 19   No results for input(s): AMMONIA in the last 168 hours. CBC: Recent Labs  Lab 09/10/20 1047 09/11/20 0828 09/12/20 0048  WBC 3.5* 4.1 4.4  HGB 9.1* 8.7* 8.8*  HCT 28.2* 26.6* 26.8*  MCV 93.4 91.1 89.3  PLT 128* 146* 149*   Cardiac Enzymes: No results for input(s): CKTOTAL, CKMB, CKMBINDEX, TROPONINI in the last 168 hours. BNP: Invalid  input(s): POCBNP CBG: Recent Labs  Lab 09/10/20 2312  GLUCAP 109*   D-Dimer No results for input(s): DDIMER in the last 72 hours. Hgb A1c No  results for input(s): HGBA1C in the last 72 hours. Lipid Profile No results for input(s): CHOL, HDL, LDLCALC, TRIG, CHOLHDL, LDLDIRECT in the last 72 hours. Thyroid function studies No results for input(s): TSH, T4TOTAL, T3FREE, THYROIDAB in the last 72 hours.  Invalid input(s): FREET3 Anemia work up No results for input(s): VITAMINB12, FOLATE, FERRITIN, TIBC, IRON, RETICCTPCT in the last 72 hours. Urinalysis    Component Value Date/Time   COLORURINE YELLOW 11/27/2019 1430   APPEARANCEUR CLEAR 11/27/2019 1430   LABSPEC 1.010 11/27/2019 1430   PHURINE 9.0 (H) 11/27/2019 1430   GLUCOSEU NEGATIVE 11/27/2019 1430   HGBUR NEGATIVE 11/27/2019 1430   BILIRUBINUR NEGATIVE 11/27/2019 1430   KETONESUR NEGATIVE 11/27/2019 1430   PROTEINUR NEGATIVE 11/27/2019 1430   UROBILINOGEN 1.0 10/05/2012 1953   NITRITE NEGATIVE 11/27/2019 1430   LEUKOCYTESUR SMALL (A) 11/27/2019 1430   Sepsis Labs Invalid input(s): PROCALCITONIN,  WBC,  LACTICIDVEN Microbiology Recent Results (from the past 240 hour(s))  Resp Panel by RT-PCR (Flu A&B, Covid) Nasopharyngeal Swab     Status: None   Collection Time: 09/10/20  2:52 PM   Specimen: Nasopharyngeal Swab; Nasopharyngeal(NP) swabs in vial transport medium  Result Value Ref Range Status   SARS Coronavirus 2 by RT PCR NEGATIVE NEGATIVE Final    Comment: (NOTE) SARS-CoV-2 target nucleic acids are NOT DETECTED.  The SARS-CoV-2 RNA is generally detectable in upper respiratory specimens during the acute phase of infection. The lowest concentration of SARS-CoV-2 viral copies this assay can detect is 138 copies/mL. A negative result does not preclude SARS-Cov-2 infection and should not be used as the sole basis for treatment or other patient management decisions. A negative result may occur with  improper specimen  collection/handling, submission of specimen other than nasopharyngeal swab, presence of viral mutation(s) within the areas targeted by this assay, and inadequate number of viral copies(<138 copies/mL). A negative result must be combined with clinical observations, patient history, and epidemiological information. The expected result is Negative.  Fact Sheet for Patients:  EntrepreneurPulse.com.au  Fact Sheet for Healthcare Providers:  IncredibleEmployment.be  This test is no t yet approved or cleared by the [place] FDA and  has been authorized for detection and/or diagnosis of SARS-CoV-2 by FDA under an Emergency Use Authorization (EUA). This EUA will remain  in effect (meaning this test can be used) for the duration of the COVID-19 declaration under Section 564(b)(1) of the Act, 21 U.S.C.section 360bbb-3(b)(1), unless the authorization is terminated  or revoked sooner.       Influenza A by PCR NEGATIVE NEGATIVE Final   Influenza B by PCR NEGATIVE NEGATIVE Final    Comment: (NOTE) The Xpert Xpress SARS-CoV-2/FLU/RSV plus assay is intended as an aid in the diagnosis of influenza from Nasopharyngeal swab specimens and should not be used as a sole basis for treatment. Nasal washings and aspirates are unacceptable for Xpert Xpress SARS-CoV-2/FLU/RSV testing.  Fact Sheet for Patients: EntrepreneurPulse.com.au  Fact Sheet for Healthcare Providers: IncredibleEmployment.be  This test is not yet approved or cleared by the [place] FDA and has been authorized for detection and/or diagnosis of SARS-CoV-2 by FDA under an Emergency Use Authorization (EUA). This EUA will remain in effect (meaning this test can be used) for the duration of the COVID-19 declaration under Section 564(b)(1) of the Act, 21 U.S.C. section 360bbb-3(b)(1), unless the authorization is terminated or revoked.  Performed at [facility], [place] [address], [place], [place] [address]   MRSA PCR Screening     Status:  None   Collection Time: 09/10/20  7:43 PM   Specimen: Nasal Mucosa; Nasopharyngeal  Result Value Ref Range Status   MRSA by PCR NEGATIVE NEGATIVE Final    Comment:        The GeneXpert MRSA Assay (FDA approved for NASAL specimens only), is one component of a comprehensive MRSA colonization surveillance program. It is not intended to diagnose MRSA infection nor to guide or monitor treatment for MRSA infections. Performed at [facility], [place] [address] [place], [place] [address]      Time coordinating discharge: 25 minutes       SIGNED:   Edwin Dada, MD  [facility] 09/12/2020, 5:52 PM

## 2020-09-12 NOTE — Plan of Care (Signed)
  Problem: Education: Goal: Knowledge of General Education information will improve Description: Including pain rating scale, medication(s)/side effects and non-pharmacologic comfort measures Outcome: Progressing   Problem: Clinical Measurements: Goal: Ability to maintain clinical measurements within normal limits will improve Outcome: Progressing Goal: Will remain free from infection Outcome: Progressing Goal: Diagnostic test results will improve Outcome: Progressing Goal: Respiratory complications will improve Outcome: Progressing Goal: Cardiovascular complication will be avoided Outcome: Progressing   Problem: Education: Goal: Knowledge of General Education information will improve Description: Including pain rating scale, medication(s)/side effects and non-pharmacologic comfort measures Outcome: Progressing   Problem: Activity: Goal: Risk for activity intolerance will decrease Outcome: Progressing   Problem: Nutrition: Goal: Adequate nutrition will be maintained Outcome: Progressing   Problem: Coping: Goal: Level of anxiety will decrease Outcome: Progressing   Problem: Elimination: Goal: Will not experience complications related to bowel motility Outcome: Progressing Goal: Will not experience complications related to urinary retention Outcome: Progressing   Problem: Pain Managment: Goal: General experience of comfort will improve Outcome: Progressing   Problem: Safety: Goal: Ability to remain free from injury will improve Outcome: Progressing   Problem: Skin Integrity: Goal: Risk for impaired skin integrity will decrease Outcome: Progressing

## 2020-09-13 ENCOUNTER — Other Ambulatory Visit: Payer: Self-pay | Admitting: Hematology and Oncology

## 2020-09-13 ENCOUNTER — Telehealth: Payer: Self-pay | Admitting: Nurse Practitioner

## 2020-09-13 NOTE — Telephone Encounter (Signed)
Transition of care contact from inpatient facility  Date of discharge: 09/12/2020 Date of contact: 09/13/2020 Method: Phone Spoke to: Patient  Patient contacted to discuss transition of care from recent inpatient hospitalization. Patient was admitted to [facility] from 11/26-11/28/2021 with discharge diagnosis of Acute on chronic diastolic CHF due to fluid overload from renal failure  Medication changes were reviewed.  Patient will follow up with his/her outpatient HD unit on: 09/14/2020

## 2020-09-15 ENCOUNTER — Other Ambulatory Visit: Payer: Self-pay

## 2020-09-15 ENCOUNTER — Inpatient Hospital Stay: Payer: Medicare HMO

## 2020-09-15 ENCOUNTER — Inpatient Hospital Stay: Payer: Medicare HMO | Attending: Hematology and Oncology | Admitting: Hematology and Oncology

## 2020-09-15 DIAGNOSIS — Z7984 Long term (current) use of oral hypoglycemic drugs: Secondary | ICD-10-CM | POA: Insufficient documentation

## 2020-09-15 DIAGNOSIS — R110 Nausea: Secondary | ICD-10-CM | POA: Diagnosis not present

## 2020-09-15 DIAGNOSIS — Z79899 Other long term (current) drug therapy: Secondary | ICD-10-CM | POA: Diagnosis not present

## 2020-09-15 DIAGNOSIS — Z992 Dependence on renal dialysis: Secondary | ICD-10-CM | POA: Diagnosis not present

## 2020-09-15 DIAGNOSIS — C9000 Multiple myeloma not having achieved remission: Secondary | ICD-10-CM

## 2020-09-15 DIAGNOSIS — N186 End stage renal disease: Secondary | ICD-10-CM

## 2020-09-15 DIAGNOSIS — G893 Neoplasm related pain (acute) (chronic): Secondary | ICD-10-CM | POA: Diagnosis not present

## 2020-09-15 DIAGNOSIS — R778 Other specified abnormalities of plasma proteins: Secondary | ICD-10-CM | POA: Insufficient documentation

## 2020-09-15 DIAGNOSIS — Z7982 Long term (current) use of aspirin: Secondary | ICD-10-CM | POA: Diagnosis not present

## 2020-09-15 DIAGNOSIS — K5900 Constipation, unspecified: Secondary | ICD-10-CM | POA: Insufficient documentation

## 2020-09-15 LAB — CBC WITH DIFFERENTIAL/PLATELET
Abs Immature Granulocytes: 0.01 10*3/uL (ref 0.00–0.07)
Basophils Absolute: 0.1 10*3/uL (ref 0.0–0.1)
Basophils Relative: 1 %
Eosinophils Absolute: 0.1 10*3/uL (ref 0.0–0.5)
Eosinophils Relative: 1 %
HCT: 30.5 % — ABNORMAL LOW (ref 36.0–46.0)
Hemoglobin: 9.9 g/dL — ABNORMAL LOW (ref 12.0–15.0)
Immature Granulocytes: 0 %
Lymphocytes Relative: 19 %
Lymphs Abs: 0.7 10*3/uL (ref 0.7–4.0)
MCH: 29.6 pg (ref 26.0–34.0)
MCHC: 32.5 g/dL (ref 30.0–36.0)
MCV: 91.3 fL (ref 80.0–100.0)
Monocytes Absolute: 0.5 10*3/uL (ref 0.1–1.0)
Monocytes Relative: 14 %
Neutro Abs: 2.6 10*3/uL (ref 1.7–7.7)
Neutrophils Relative %: 65 %
Platelets: 182 10*3/uL (ref 150–400)
RBC: 3.34 MIL/uL — ABNORMAL LOW (ref 3.87–5.11)
RDW: 15.6 % — ABNORMAL HIGH (ref 11.5–15.5)
WBC: 3.9 10*3/uL — ABNORMAL LOW (ref 4.0–10.5)
nRBC: 0 % (ref 0.0–0.2)

## 2020-09-15 LAB — COMPREHENSIVE METABOLIC PANEL
ALT: 20 U/L (ref 0–44)
AST: 22 U/L (ref 15–41)
Albumin: 3.4 g/dL — ABNORMAL LOW (ref 3.5–5.0)
Alkaline Phosphatase: 73 U/L (ref 38–126)
Anion gap: 11 (ref 5–15)
BUN: 10 mg/dL (ref 8–23)
CO2: 31 mmol/L (ref 22–32)
Calcium: 9 mg/dL (ref 8.9–10.3)
Chloride: 99 mmol/L (ref 98–111)
Creatinine, Ser: 2.85 mg/dL — ABNORMAL HIGH (ref 0.44–1.00)
GFR, Estimated: 15 mL/min — ABNORMAL LOW (ref >60)
Glucose, Bld: 106 mg/dL — ABNORMAL HIGH (ref 70–99)
Potassium: 3.2 mmol/L — ABNORMAL LOW (ref 3.5–5.1)
Sodium: 141 mmol/L (ref 135–145)
Total Bilirubin: 0.5 mg/dL (ref 0.3–1.2)
Total Protein: 5.9 g/dL — ABNORMAL LOW (ref 6.5–8.1)

## 2020-09-15 MED ORDER — ACYCLOVIR 400 MG PO TABS: 400.0000 mg | ORAL_TABLET | Freq: Every day | ORAL | 6 refills | Status: AC

## 2020-09-15 MED ORDER — OXYCODONE HCL 5 MG PO TABS: 5.0000 mg | ORAL_TABLET | ORAL | 0 refills | Status: DC | PRN

## 2020-09-15 MED ORDER — PROCHLORPERAZINE MALEATE 5 MG PO TABS
5.0000 mg | ORAL_TABLET | Freq: Four times a day (QID) | ORAL | 1 refills | Status: DC | PRN
Start: 2020-09-15 — End: 2020-10-27

## 2020-09-16 ENCOUNTER — Encounter: Payer: Self-pay | Admitting: Hematology and Oncology

## 2020-09-16 LAB — KAPPA/LAMBDA LIGHT CHAINS
Kappa free light chain: 46.6 mg/L — ABNORMAL HIGH (ref 3.3–19.4)
Kappa, lambda light chain ratio: 2.4 — ABNORMAL HIGH (ref 0.26–1.65)
Lambda free light chains: 19.4 mg/L (ref 5.7–26.3)

## 2020-09-16 NOTE — Assessment & Plan Note (Signed)
She has multifactorial intermittent nausea, could be exacerbated by side effects of pain medicine and constipation I recommend she use Zofran or Compazine at different times of the day We discussed side effects to be expected with each medication

## 2020-09-16 NOTE — Progress Notes (Signed)
[facility] OFFICE PROGRESS NOTE  Patient Care Team: Lucianne Lei, MD as PCP - General (Family Medicine) Thompson Grayer, MD as PCP - Electrophysiology (Cardiology) Josue Hector, MD as PCP - Cardiology (Cardiology)  ASSESSMENT & PLAN:  Multiple myeloma not having achieved remission [facility]) I reviewed her recent myeloma panel with the patient and her son She have excellent response to therapy and has achieved near complete response However, she continues to have significant other health issues including recent hospitalization for fluid retention, elevated troponin, chronic nausea, chronic constipation and others She felt that the chemotherapy may have contributed to her symptoms of nausea At this point in time, I recommend we take a break from treatment I plan to see her every month and monitor her myeloma panel closely She is in agreement  Nausea in adult She has multifactorial intermittent nausea, could be exacerbated by side effects of pain medicine and constipation I recommend she use Zofran or Compazine at different times of the day We discussed side effects to be expected with each medication  ESRD (end stage renal disease) on dialysis [facility]) We discussed the limitations of treatment options due to her end-stage renal disease She will continue hemodialysis  Cancer associated pain She has multifactorial bone pain She is not a candidate for aggressive management given her age and comorbidities I recommend increased dose of vitamin D supplement We discussed the use of over-the-counter acetaminophen in addition to tramadol or oxycodone for severe bone pain After much discussion, she is in agreement to have the dose of tramadol increased to 100 mg each time as needed and to reserve to use oxycodone for severe pain only We discussed the risk of constipation and nausea with pain medicine We discussed narcotic refill policy   No orders of the defined types were placed in  this encounter.   All questions were answered. The patient knows to call the clinic with any problems, questions or concerns. The total time spent in the appointment was 20 minutes encounter with patients including review of chart and various tests results, discussions about plan of care and coordination of care plan   Heath Lark, MD 09/16/2020 10:42 AM  INTERVAL HISTORY: Please see below for problem oriented charting. She returns with her son for further follow-up She was recently hospitalized for chest pain and was found to have elevated troponin and evidence of fluid overload Her medications has been adjusted She continues to complain of nausea and constipation which she attributed partly to chemotherapy Her performance status is poor Her chronic pain is stable on current medication  SUMMARY OF ONCOLOGIC HISTORY: Oncology History  Multiple myeloma not having achieved remission ([place])  03/03/2019 Initial Diagnosis   Multiple myeloma not having achieved remission ([place])   03/03/2019 Imaging   1. Round and oval lucent/lytic lesions in the skull, bilateral humeri, distal left femur, and possibly also in the pelvis are compatible with multiple myeloma. No right lower extremity lytic lesion identified. No pathologic fracture. 2. Advanced degenerative changes in the spine with no vertebral myeloma evident by plain radiography. 3. Advanced degenerative changes in the right upper extremity. Bilateral knee arthroplasty. 4.  Aortic Atherosclerosis (ICD10-I70.0).   03/04/2019 -  Chemotherapy   The patient had bortezomib for chemotherapy treatment.    03/04/2019 Bone Marrow Biopsy   Bone Marrow, Aspirate,Biopsy, and Clot - HYPERCELLULAR BONE MARROW FOR AGE WITH PLASMACYTOSIS. - SEE COMMENT. PERIPHERAL BLOOD: - NORMOCYTIC-NORMOCHROMIC ANEMIA. Diagnosis Note The bone marrow is hypercellular for age with increased  number of atypical plasma cells averaging 30% of all cells in the aspirate  associated with interstitial infiltrates and numerous variably sized aggregates in the clot and biopsy sections. 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 Confirmatory in situ hybridization for kappa and lambda as well as CD138 will be performed and the results reported in an addendum. The background shows trilineage hematopoiesis with mild non-specific changes primarily involving the erythroid series. Correlation with cytogenetic and FISH studies is recommended.    06/20/2019 -  Chemotherapy   The patient had dexamethasone (DECADRON) tablet 20 mg, 20 mg (100 % of original dose 20 mg), Oral,  Once, 19 of 21 cycles Dose modification: 20 mg (original dose 20 mg, Cycle 1), 8 mg (original dose 8 mg, Cycle 10) Administration: 20 mg (06/20/2019), 20 mg (06/27/2019), 20 mg (07/04/2019), 20 mg (07/11/2019), 20 mg (07/18/2019), 20 mg (07/25/2019), 20 mg (08/08/2019), 20 mg (08/15/2019), 20 mg (09/05/2019), 20 mg (12/01/2019), 20 mg (09/26/2019), 20 mg (10/20/2019), 20 mg (11/10/2019), 20 mg (12/22/2019), 20 mg (01/12/2020), 8 mg (02/02/2020), 8 mg (02/23/2020), 8 mg (03/22/2020), 8 mg (04/12/2020), 8 mg (05/03/2020), 8 mg (05/31/2020), 8 mg (06/23/2020), 8 mg (07/14/2020), 8 mg (08/04/2020), 8 mg (08/25/2020) daratumumab-hyaluronidase-fihj (DARZALEX FASPRO) 1800-30000 MG-UT/15ML chemo SQ injection 1,800 mg, 1,800 mg, Subcutaneous,  Once, 19 of 21 cycles Administration: 1,800 mg (06/20/2019), 1,800 mg (06/27/2019), 1,800 mg (07/04/2019), 1,800 mg (07/11/2019), 1,800 mg (07/18/2019), 1,800 mg (07/25/2019), 1,800 mg (08/08/2019), 1,800 mg (08/15/2019), 1,800 mg (09/05/2019), 1,800 mg (12/01/2019), 1,800 mg (02/02/2020), 1,800 mg (09/26/2019), 1,800 mg (10/20/2019), 1,800 mg (11/10/2019), 1,800 mg (12/22/2019), 1,800 mg (01/12/2020), 1,800 mg (02/23/2020), 1,800 mg (03/22/2020), 1,800 mg (04/12/2020), 1,800 mg (05/03/2020), 1,800 mg (05/31/2020), 1,800 mg (06/23/2020), 1,800 mg (07/14/2020), 1,800 mg (08/04/2020), 1,800 mg (08/25/2020)  for chemotherapy  treatment.      REVIEW OF SYSTEMS:   Constitutional: Denies fevers, chills or abnormal weight loss Eyes: Denies blurriness of vision Ears, nose, mouth, throat, and face: Denies mucositis or sore throat Respiratory: Denies cough, dyspnea or wheezes Cardiovascular: Denies palpitation, chest discomfort or lower extremity swelling Skin: Denies abnormal skin rashes Lymphatics: Denies new lymphadenopathy or easy bruising Neurological:Denies numbness, tingling or new weaknesses Behavioral/Psych: Mood is stable, no new changes  All other systems were reviewed with the patient and are negative.  I have reviewed the past medical history, past surgical history, social history and family history with the patient and they are unchanged from previous note.  ALLERGIES:  is allergic to aspirin, hydrocodone-acetaminophen, morphine, penicillins, betaine, dacarbazine, ketorolac tromethamine, brimonidine, diltiazem hcl, hydrocodone-acetaminophen, neurontin [gabapentin], and sulfacetamide sodium.  MEDICATIONS:  Current Outpatient Medications  Medication Sig Dispense Refill  . acetaminophen (TYLENOL) 500 MG tablet Take 1,000 mg by mouth 2 (two) times daily as needed for mild pain or moderate pain.     Marland Kitchen acyclovir (ZOVIRAX) 400 MG tablet Take 1 tablet (400 mg total) by mouth daily. 90 tablet 6  . allopurinol (ZYLOPRIM) 100 MG tablet Take 0.5 tablets (50 mg total) by mouth daily. (Patient taking differently: Take 50 mg by mouth at bedtime. ) 30 tablet 0  . aspirin EC 81 MG tablet Take 81 mg by mouth daily. Swallow whole.    . cholecalciferol (VITAMIN D3) 25 MCG (1000 UT) tablet Take 2,000 Units by mouth daily.    . dorzolamide (TRUSOPT) 2 % ophthalmic solution Place 1 drop into the left eye 2 (two) times daily.   1  . fluorometholone (FML) 0.1 % ophthalmic suspension Place 1 drop into  the right eye daily.     Marland Kitchen glimepiride (AMARYL) 1 MG tablet Take 0.5 mg by mouth daily.     Marland Kitchen lactulose (CHRONULAC) 10 GM/15ML  solution Take 15 mLs (10 g total) by mouth 3 (three) times daily. 473 mL 11  . lenalidomide (REVLIMID) 2.5 MG capsule Take 1 capsule three times a week after dialysis on Tuesday, Thursday and Saturdays 12 capsule 0  . lidocaine-prilocaine (EMLA) cream Apply 1 application topically as needed [facility]).     Marland Kitchen LUMIGAN 0.01 % SOLN Place 1 drop into the left eye at bedtime.     Marland Kitchen LYRICA 50 MG capsule Take 50 mg by mouth at bedtime.     . Methoxy PEG-Epoetin Beta (MIRCERA IJ) Mircera    . ondansetron (ZOFRAN) 4 MG tablet TAKE 1 TABLET BY MOUTH EVERY 8 HOURS AS NEEDED FOR NAUSEA (Patient taking differently: Take 4 mg by mouth every 8 (eight) hours as needed for nausea. ) 30 tablet 1  . oxyCODONE (OXY IR/ROXICODONE) 5 MG immediate release tablet Take 1 tablet (5 mg total) by mouth every 4 (four) hours as needed for severe pain. 60 tablet 0  . pantoprazole (PROTONIX) 40 MG tablet Take 1 tablet (40 mg total) by mouth daily as needed (acid reflux/indigestion.). 30 tablet 11  . polyethylene glycol (MIRALAX / GLYCOLAX) 17 g packet Take 17 g by mouth daily.     . prochlorperazine (COMPAZINE) 5 MG tablet Take 1 tablet (5 mg total) by mouth every 6 (six) hours as needed for nausea or vomiting. 90 tablet 1  . senna-docusate (SENOKOT-S) 8.6-50 MG tablet Take 2 tablets by mouth 2 (two) times daily. 30 tablet 0  . sevelamer carbonate (RENVELA) 800 MG tablet Take 1,600 mg by mouth 3 (three) times daily with meals.     . sodium chloride (OCEAN) 0.65 % SOLN nasal spray Place 1 spray into both nostrils 4 (four) times daily as needed for congestion.     . traMADol (ULTRAM) 50 MG tablet Take 2 tablets (100 mg total) by mouth every 6 (six) hours as needed. (Patient taking differently: Take 100 mg by mouth every 6 (six) hours as needed for moderate pain. ) 60 tablet 0   Current Facility-Administered Medications  Medication Dose Route Frequency Provider Last Rate Last Admin  . 0.9 %  sodium chloride infusion  250 mL Intravenous  PRN Waynetta Sandy, MD        PHYSICAL EXAMINATION: ECOG PERFORMANCE STATUS: 2 - Symptomatic, <50% confined to bed  Vitals:   09/15/20 1013  BP: (!) 118/41  SpO2: 100%   Filed Weights   09/15/20 1013  Weight: 115 lb 9.6 oz (52.4 kg)    GENERAL:alert, no distress and comfortable Musculoskeletal:no cyanosis of digits and no clubbing  NEURO: alert & oriented x 3 with fluent speech, no focal motor/sensory deficits  LABORATORY DATA:  I have reviewed the data as listed    Component Value Date/Time   NA 141 09/15/2020 0938   K 3.2 (L) 09/15/2020 0938   CL 99 09/15/2020 0938   CO2 31 09/15/2020 0938   GLUCOSE 106 (H) 09/15/2020 0938   BUN 10 09/15/2020 0938   CREATININE 2.85 (H) 09/15/2020 0938   CREATININE 2.71 (H) 10/20/2019 1128   CREATININE 1.03 (H) 05/11/2016 0931   CALCIUM 9.0 09/15/2020 0938   PROT 5.9 (L) 09/15/2020 0938   ALBUMIN 3.4 (L) 09/15/2020 0938   AST 22 09/15/2020 0938   AST 18 10/20/2019 1128   ALT 20  09/15/2020 0938   ALT 10 10/20/2019 1128   ALKPHOS 73 09/15/2020 0938   BILITOT 0.5 09/15/2020 0938   BILITOT 0.4 10/20/2019 1128   GFRNONAA 15 (L) 09/15/2020 0938   GFRNONAA 15 (L) 10/20/2019 1128   GFRAA 14 (L) 07/14/2020 1021   GFRAA 18 (L) 10/20/2019 1128    No results found for: SPEP, UPEP  Lab Results  Component Value Date   WBC 3.9 (L) 09/15/2020   NEUTROABS 2.6 09/15/2020   HGB 9.9 (L) 09/15/2020   HCT 30.5 (L) 09/15/2020   MCV 91.3 09/15/2020   PLT 182 09/15/2020      Chemistry      Component Value Date/Time   NA 141 09/15/2020 0938   K 3.2 (L) 09/15/2020 0938   CL 99 09/15/2020 0938   CO2 31 09/15/2020 0938   BUN 10 09/15/2020 0938   CREATININE 2.85 (H) 09/15/2020 0938   CREATININE 2.71 (H) 10/20/2019 1128   CREATININE 1.03 (H) 05/11/2016 0931      Component Value Date/Time   CALCIUM 9.0 09/15/2020 0938   ALKPHOS 73 09/15/2020 0938   AST 22 09/15/2020 0938   AST 18 10/20/2019 1128   ALT 20 09/15/2020 0938    ALT 10 10/20/2019 1128   BILITOT 0.5 09/15/2020 0938   BILITOT 0.4 10/20/2019 1128

## 2020-09-16 NOTE — Assessment & Plan Note (Signed)
I reviewed her recent myeloma panel with the patient and her son She have excellent response to therapy and has achieved near complete response However, she continues to have significant other health issues including recent hospitalization for fluid retention, elevated troponin, chronic nausea, chronic constipation and others She felt that the chemotherapy may have contributed to her symptoms of nausea At this point in time, I recommend we take a break from treatment I plan to see her every month and monitor her myeloma panel closely She is in agreement

## 2020-09-16 NOTE — Assessment & Plan Note (Signed)
She has multifactorial bone pain She is not a candidate for aggressive management given her age and comorbidities I recommend increased dose of vitamin D supplement We discussed the use of over-the-counter acetaminophen in addition to tramadol or oxycodone for severe bone pain After much discussion, she is in agreement to have the dose of tramadol increased to 100 mg each time as needed and to reserve to use oxycodone for severe pain only We discussed the risk of constipation and nausea with pain medicine We discussed narcotic refill policy

## 2020-09-16 NOTE — Assessment & Plan Note (Signed)
We discussed the limitations of treatment options due to her end-stage renal disease °She will continue hemodialysis °

## 2020-09-17 ENCOUNTER — Telehealth: Payer: Self-pay

## 2020-09-17 LAB — MULTIPLE MYELOMA PANEL, SERUM
Albumin SerPl Elph-Mcnc: 3.2 g/dL (ref 2.9–4.4)
Albumin/Glob SerPl: 1.6 (ref 0.7–1.7)
Alpha 1: 0.3 g/dL (ref 0.0–0.4)
Alpha2 Glob SerPl Elph-Mcnc: 0.6 g/dL (ref 0.4–1.0)
B-Globulin SerPl Elph-Mcnc: 0.9 g/dL (ref 0.7–1.3)
Gamma Glob SerPl Elph-Mcnc: 0.3 g/dL — ABNORMAL LOW (ref 0.4–1.8)
Globulin, Total: 2.1 g/dL — ABNORMAL LOW (ref 2.2–3.9)
IgA: 28 mg/dL — ABNORMAL LOW (ref 64–422)
IgG (Immunoglobin G), Serum: 735 mg/dL (ref 586–1602)
IgM (Immunoglobulin M), Srm: 11 mg/dL — ABNORMAL LOW (ref 26–217)
M Protein SerPl Elph-Mcnc: 0.4 g/dL — ABNORMAL HIGH
Total Protein ELP: 5.3 g/dL — ABNORMAL LOW (ref 6.0–8.5)

## 2020-09-17 NOTE — Telephone Encounter (Signed)
-----   Message from Heath Lark, MD sent at 09/17/2020 11:44 AM EST ----- Regarding: pls call pt or son Pls let her know myeloma panel still very good. I hope she feels better soon

## 2020-09-17 NOTE — Telephone Encounter (Signed)
Called and given below message. She verbalized understanding. 

## 2020-10-05 ENCOUNTER — Other Ambulatory Visit: Payer: Self-pay | Admitting: Hematology and Oncology

## 2020-10-05 ENCOUNTER — Encounter: Payer: Medicare HMO | Admitting: Student

## 2020-10-06 ENCOUNTER — Other Ambulatory Visit: Payer: Medicare HMO

## 2020-10-06 ENCOUNTER — Ambulatory Visit: Payer: Medicare HMO | Admitting: Hematology and Oncology

## 2020-10-06 ENCOUNTER — Ambulatory Visit: Payer: Medicare HMO

## 2020-10-07 ENCOUNTER — Encounter: Payer: Self-pay | Admitting: Student

## 2020-10-07 ENCOUNTER — Other Ambulatory Visit: Payer: Self-pay

## 2020-10-07 ENCOUNTER — Ambulatory Visit: Payer: Medicare HMO | Admitting: Student

## 2020-10-07 VITALS — BP 112/50 | HR 73 | Ht 65.0 in | Wt 115.0 lb

## 2020-10-07 DIAGNOSIS — I495 Sick sinus syndrome: Secondary | ICD-10-CM | POA: Diagnosis not present

## 2020-10-07 DIAGNOSIS — I10 Essential (primary) hypertension: Secondary | ICD-10-CM

## 2020-10-07 DIAGNOSIS — G4733 Obstructive sleep apnea (adult) (pediatric): Secondary | ICD-10-CM

## 2020-10-07 DIAGNOSIS — I471 Supraventricular tachycardia: Secondary | ICD-10-CM

## 2020-10-07 DIAGNOSIS — Z992 Dependence on renal dialysis: Secondary | ICD-10-CM

## 2020-10-07 DIAGNOSIS — N186 End stage renal disease: Secondary | ICD-10-CM

## 2020-10-07 LAB — CUP PACEART INCLINIC DEVICE CHECK
Battery Remaining Longevity: 80 mo
Battery Voltage: 2.96 V
Brady Statistic RA Percent Paced: 90 %
Brady Statistic RV Percent Paced: 99 %
Date Time Interrogation Session: 20211223131708
Implantable Lead Implant Date: 20100422
Implantable Lead Implant Date: 20100422
Implantable Lead Location: 753859
Implantable Lead Location: 753860
Implantable Pulse Generator Implant Date: 20170801
Lead Channel Impedance Value: 375 Ohm
Lead Channel Impedance Value: 400 Ohm
Lead Channel Pacing Threshold Amplitude: 0.75 V
Lead Channel Pacing Threshold Amplitude: 0.75 V
Lead Channel Pacing Threshold Amplitude: 0.75 V
Lead Channel Pacing Threshold Amplitude: 0.75 V
Lead Channel Pacing Threshold Pulse Width: 0.5 ms
Lead Channel Pacing Threshold Pulse Width: 0.5 ms
Lead Channel Pacing Threshold Pulse Width: 0.5 ms
Lead Channel Pacing Threshold Pulse Width: 0.5 ms
Lead Channel Sensing Intrinsic Amplitude: 10.5 mV
Lead Channel Sensing Intrinsic Amplitude: 2.9 mV
Lead Channel Setting Pacing Amplitude: 2 V
Lead Channel Setting Pacing Amplitude: 2.5 V
Lead Channel Setting Pacing Pulse Width: 0.5 ms
Lead Channel Setting Sensing Sensitivity: 2 mV
Pulse Gen Model: 2272
Pulse Gen Serial Number: 7929552

## 2020-10-07 NOTE — Progress Notes (Signed)
Electrophysiology Office Note Date: 10/07/2020  ID:  [name], DOB [birth year], MRN [id number]  PCP: Lucianne Lei, MD Primary Cardiologist: Jenkins Rouge, MD Electrophysiologist: Thompson Grayer, MD   CC: Pacemaker follow-up  [name] is a 86 y.o. female seen today for Thompson Grayer, MD for routine electrophysiology followup.  Since last being seen in our clinic the patient reports doing about the same. She has had intermittent hypotension and been started on midodrine on her HD days. She has had chronic nausea for >2 years. There has been some concern recently that this has been related to hypotension, though her hypotension has been relatively consistent over the past several years. She reports her systolic BPs were as low as the 90s during HD prior to starting on midodrine. Now, they are usually 100-120s. She denies SOB or CP. She is not on any anti-hypertensives. She has a very poor appetite overall.   Device History: St. Jude Dual Chamber PPM implanted 01/2009, gen change 05/16/2016 for SSS/second degree AV block.    Past Medical History:  Diagnosis Date  . Anemia   . Arthritis   . Benign paroxysmal positional vertigo 04/06/2010  . Benign positional vertigo   . 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 CAROTID BRUIT 02/24/2008  . Complication of anesthesia    took a long time to wake up with knee replacement  . DIABETES MELLITUS, TYPE II 02/24/2008  . ESRD (end stage renal disease) on dialysis ([place])    tues thurs sat [facility] kidney center sees [place] [facility]  . GERD (gastroesophageal reflux disease)   . Glaucoma   . Gout   . HYPERTENSION 02/24/2008  . Multiple myeloma ([place])   . Obstructive sleep apnea   . Pancytopenia ([place])   . Sick sinus syndrome ([place])   . Wears dentures   . Wears glasses    Past Surgical History:  Procedure Laterality Date  . A/V FISTULAGRAM Right 03/08/2020   Procedure: A/V FISTULAGRAM- Right Arm;  Surgeon: Waynetta Sandy, MD;  Location: [place] [facility];  Service: Cardiovascular;  Laterality: Right;  . A/V FISTULAGRAM N/A 06/28/2020   Procedure: A/V FISTULAGRAM - Right Upper Arm;  Surgeon: Waynetta Sandy, MD;  Location: [place] [facility];  Service: Cardiovascular;  Laterality: N/A;  . ABDOMINAL HYSTERECTOMY     1980's  . AV FISTULA PLACEMENT Right 03/11/2019   Procedure: CREATION RIGHT ARM RADIOCEPHALIC ARTERIOVENOUS FISTULA;  Surgeon: Angelia Mould, MD;  Location: [place];  Service: Vascular;  Laterality: Right;  . AV FISTULA PLACEMENT Right 03/25/2020   Procedure: right arm ARTERIOVENOUS (AV) FISTULA CREATION;  Surgeon: Waynetta Sandy, MD;  Location: [facility];  Service: Vascular;  Laterality: Right;  . BACK SURGERY     x 3  . BIOPSY  06/24/2019   Procedure: BIOPSY;  Surgeon: Ronald Lobo, MD;  Location: [facility];  Service: Endoscopy;;  . EP IMPLANTABLE DEVICE N/A 05/16/2016   Procedure: PPM Generator Changeout;  Surgeon: Thompson Grayer, MD;  Location: [place] [facility];  Service: Cardiovascular;  Laterality: N/A;  . ESOPHAGOGASTRODUODENOSCOPY (EGD) WITH PROPOFOL N/A 06/24/2019   Procedure: ESOPHAGOGASTRODUODENOSCOPY (EGD) WITH PROPOFOL;  Surgeon: Ronald Lobo, MD;  Location: [facility];  Service: Endoscopy;  Laterality: N/A;  . FISTULA SUPERFICIALIZATION Right 03/11/2019   Procedure: FISTULA SUPERFICIALIZATION;  Surgeon: Angelia Mould, MD;  Location: [place];  Service: Vascular;  Laterality: Right;  . INSERTION OF DIALYSIS CATHETER Right 03/25/2020   Procedure: INSERTION OF DIALYSIS CATHETER,  right internal jugular;  Surgeon: Waynetta Sandy, MD;  Location: [place];  Service: Vascular;  Laterality: Right;  . IR FLUORO GUIDE CV LINE RIGHT  03/07/2019  . IR US GUIDE VASC ACCESS RIGHT  03/07/2019  . KNEE SURGERY Right    2003  . KNEE SURGERY Left    2011  . PACEMAKER INSERTION    . PERIPHERAL VASCULAR BALLOON ANGIOPLASTY Right 06/28/2020   Procedure: PERIPHERAL VASCULAR BALLOON  ANGIOPLASTY;  Surgeon: Waynetta Sandy, MD;  Location: [place] [facility];  Service: Cardiovascular;  Laterality: Right;    Current Outpatient Medications  Medication Sig Dispense Refill  . acetaminophen (TYLENOL) 500 MG tablet Take 1,000 mg by mouth 2 (two) times daily as needed for mild pain or moderate pain.     Marland Kitchen acyclovir (ZOVIRAX) 400 MG tablet Take 1 tablet (400 mg total) by mouth daily. 90 tablet 6  . allopurinol (ZYLOPRIM) 100 MG tablet Take 0.5 tablets (50 mg total) by mouth daily. (Patient taking differently: Take 50 mg by mouth at bedtime.) 30 tablet 0  . aspirin EC 81 MG tablet Take 81 mg by mouth daily. HOLD...    . cholecalciferol (VITAMIN D3) 25 MCG (1000 UT) tablet Take 2,000 Units by mouth daily.    . dorzolamide (TRUSOPT) 2 % ophthalmic solution Place 1 drop into the left eye 2 (two) times daily.   1  . fluorometholone (FML) 0.1 % ophthalmic suspension Place 1 drop into the right eye daily.     Marland Kitchen glimepiride (AMARYL) 1 MG tablet Take 0.5 mg by mouth daily.     Marland Kitchen lactulose (CHRONULAC) 10 GM/15ML solution Take 15 mLs (10 g total) by mouth 3 (three) times daily. 473 mL 11  . lidocaine-prilocaine (EMLA) cream Apply 1 application topically as needed [facility]).     Marland Kitchen LUMIGAN 0.01 % SOLN Place 1 drop into the left eye at bedtime.     Marland Kitchen LYRICA 50 MG capsule Take 50 mg by mouth at bedtime.    . Methoxy PEG-Epoetin Beta (MIRCERA IJ) Mircera    . midodrine (PROAMATINE) 5 MG tablet Take 5 mg by mouth as directed. Low bp, and before dialysis    . ondansetron (ZOFRAN) 4 MG tablet Take 1 tablet (4 mg total) by mouth every 8 (eight) hours as needed for nausea. 30 tablet 1  . oxyCODONE (OXY IR/ROXICODONE) 5 MG immediate release tablet Take 1 tablet (5 mg total) by mouth every 4 (four) hours as needed for severe pain. 60 tablet 0  . pantoprazole (PROTONIX) 40 MG tablet Take 1 tablet (40 mg total) by mouth daily as needed (acid reflux/indigestion.). 30 tablet 11  . polyethylene glycol  (MIRALAX / GLYCOLAX) 17 g packet Take 17 g by mouth daily.    . prochlorperazine (COMPAZINE) 5 MG tablet Take 1 tablet (5 mg total) by mouth every 6 (six) hours as needed for nausea or vomiting. 90 tablet 1  . senna-docusate (SENOKOT-S) 8.6-50 MG tablet Take 2 tablets by mouth 2 (two) times daily. 30 tablet 0  . sevelamer carbonate (RENVELA) 800 MG tablet Take 1,600 mg by mouth 3 (three) times daily with meals.     . sodium chloride (OCEAN) 0.65 % SOLN nasal spray Place 1 spray into both nostrils 4 (four) times daily as needed for congestion.     . traMADol (ULTRAM) 50 MG tablet Take 2 tablets (100 mg total) by mouth every 6 (six) hours as needed. (Patient taking differently: Take 100 mg by mouth every 6 (  six) hours as needed for moderate pain.) 60 tablet 0   Current Facility-Administered Medications  Medication Dose Route Frequency Provider Last Rate Last Admin  . 0.9 %  sodium chloride infusion  250 mL Intravenous PRN Waynetta Sandy, MD        Allergies:   Aspirin, Hydrocodone-acetaminophen, Morphine, Penicillins, Betaine, Dacarbazine, Ketorolac tromethamine, Brimonidine, Diltiazem hcl, Hydrocodone-acetaminophen, Neurontin [gabapentin], and Sulfacetamide sodium   Social History: Social History   Socioeconomic History  . Marital status: Married    Spouse name: Not on file  . Number of children: 5  . Years of education: Not on file  . Highest education level: Not on file  Occupational History  . Occupation: retired  Tobacco Use  . Smoking status: Never Smoker  . Smokeless tobacco: Never Used  Vaping Use  . Vaping Use: Never used  Substance and Sexual Activity  . Alcohol use: No  . Drug use: No  . Sexual activity: Not on file  Other Topics Concern  . Not on file  Social History Narrative   Retired Marine scientist   Social Determinants of Radio broadcast assistant Strain: Not on file  Food Insecurity: Not on file  Transportation Needs: Not on file  Physical Activity: Not  on file  Stress: Not on file  Social Connections: Not on file  Intimate Partner Violence: Not on file    Family History: Family History  Problem Relation Age of Onset  . Congestive Heart Failure Mother   . Tuberculosis Mother   . Multiple myeloma Mother   . Bone cancer Father   . Arthritis Father   . Breast cancer Sister   . Colon cancer Sister   . Hypertension Sister   . Dementia Sister   . Alcohol abuse Son     Review of systems complete and found to be negative unless listed in HPI.    Physical Exam: Vitals:   10/07/20 1044  BP: (!) 112/50  Pulse: 73  SpO2: 100%  Weight: 115 lb (52.2 kg)  Height: 5' 5"  (1.651 m)    General: Pleasant, NAD. No resp difficulty Psych: Normal affect. HEENT: Normal, without mass or lesion. Neck: Supple, no bruits or JVD. Carotids 2+. No lymphadenopathy/thyromegaly appreciated. Heart: PMI nondisplaced. RRR no s3, s4, or murmurs. Lungs: Resp regular and unlabored, CTA. Abdomen: Soft, non-tender, non-distended, No HSM, BS + x 4.  Extremities: No clubbing, cyanosis or edema. DP/PT/Radials 2+ and equal bilaterally. Neuro: Alert and oriented X 3. Moves all extremities spontaneously.  PPM Interrogation- Reviewed in detail today, See PACEART report.   EKG:  EKG is not ordered today.  Recent Labs: 04/02/2020: B Natriuretic Peptide 260.8; Magnesium 2.0 09/15/2020: ALT 20; BUN 10; Creatinine, Ser 2.85; Hemoglobin 9.9; Platelets 182; Potassium 3.2; Sodium 141   Wt Readings from Last 3 Encounters:  10/07/20 115 lb (52.2 kg)  09/15/20 115 lb 9.6 oz (52.4 kg)  09/11/20 112 lb 14 oz (51.2 kg)     Other studies Reviewed: Additional studies/ records that were reviewed today include: Previous EP office notes, recent admission notes.   Assessment and Plan:  1. SSS/second degree AV block s/p St. Jude PPM  Normal PPM function.  See Claudia Desanctis Art report No changes today.   2. Hypotension Normal device function.  Off anti-hypertensive  medication. She is now on midodrine 5 mg on HD days. OK to titrate up per Nephrology from a cardiac perspective.   3. ESRD on HD Recent issues with her fistula, and s/p revision of  right arm AV fistula 03/25/20. Now on midodrine on HD days as above.   4. OSA Compliant with CPAP   5. Atach Previously noted on device.  Overall low burden.   6. Chronic nausea/Failure to thrive Multifactorial with her significant co-morbidities  I do not think her long standing hypotension is the primary contributor.  Most recent EF 40-45%.  She has seen GI in the past. Would recommend management per PCP and GI.  Oncology (follows for multiple myeloma) may also have recommendations for chronic nausea.  Body mass index is 19.14 kg/m. Cyclical component due to her poor appetite and overall low oral intake.    Current medicines are reviewed at length with the patient today.   The patient does have concerns regarding her medicines.  The following changes were made today:  None  Labs/ tests ordered today include:  No orders of the defined types were placed in this encounter.  Disposition:   Follow up with EP in 6 months. Sooner with issues.   Jacalyn Lefevre, PA-C  10/07/2020 11:02 AM  [facility] [address] [place] [address] [phone] (office) [phone] (fax)

## 2020-10-07 NOTE — Patient Instructions (Signed)
Medication Instructions:  *If you need a refill on your cardiac medications before your next appointment, please call your pharmacy*  Follow-Up: At [facility], you and your health needs are our priority.  As part of our continuing mission to provide you with exceptional heart care, we have created designated Provider Care Teams.  These Care Teams include your primary Cardiologist (physician) and Advanced Practice Providers (APPs -  Physician Assistants and Nurse Practitioners) who all work together to provide you with the care you need, when you need it.  We recommend signing up for the patient portal called "MyChart".  Sign up information is provided on this After Visit Summary.  MyChart is used to connect with patients for Virtual Visits (Telemedicine).  Patients are able to view lab/test results, encounter notes, upcoming appointments, etc.  Non-urgent messages can be sent to your provider as well.   To learn more about what you can do with MyChart, go to [email or web address].    Your next appointment:   Your physician wants you to follow-up in: 6 MONTHS with Oda Kilts, PA-C.  You will receive a reminder letter in the mail two months in advance. If you don't receive a letter, please call our office to schedule the follow-up appointment.  Remote monitoring is used to monitor your Pacemaker from home. This monitoring reduces the number of office visits required to check your device to one time per year. It allows [place] to keep an eye on the functioning of your device to ensure it is working properly. You are scheduled for a device check from home on 11/05/20. You may send your transmission at any time that day. If you have a wireless device, the transmission will be sent automatically. After your physician reviews your transmission, you will receive a postcard with your next transmission date.  The format for your next appointment:   In Person with Thompson Grayer, MD

## 2020-10-14 ENCOUNTER — Telehealth: Payer: Self-pay | Admitting: Emergency Medicine

## 2020-10-14 NOTE — Telephone Encounter (Signed)
Received alert for AF episodes. Patient has AT/af burden of 27 %. Presenting EGM is AFL/VP. Patient currently at dialysis until 4:30 pm. Patient's son on DPR and reports she has had increased nausea the past 2 days. He reports she has not reported no CP, SOB, decreased energy, dizziness, or syncope. He will send a remote transmission when they return home after dialysis. Longest AF episode  1 day 17 hours, 38 minutes , 28 seconds. Son made aware patient will be referred to AF clinic and he is to be contacted to schedule appointment. ED precautions given.

## 2020-10-14 NOTE — Telephone Encounter (Signed)
I gave patient son verbal instructions on how to send the transmission. He states he is about to send the transmission.

## 2020-10-18 NOTE — Telephone Encounter (Signed)
Manual transmission received, pt continues in AF. Routing to AF clinic to f/u with pt for consult.

## 2020-10-18 NOTE — Telephone Encounter (Signed)
Called patient but mailbox full, unable to leave message.  Will call back.

## 2020-10-19 NOTE — Telephone Encounter (Signed)
Tried to reach patient, no answer and VM still full.  Will call back

## 2020-10-20 NOTE — Telephone Encounter (Signed)
Called priority phone number on file, still no answer and VM still full.  Tried the home number listed for patient.  Left message on home phone for patient or son to call us to schedule appt.

## 2020-10-21 NOTE — Telephone Encounter (Signed)
appt made for 1/10

## 2020-10-25 ENCOUNTER — Encounter (HOSPITAL_COMMUNITY): Payer: Self-pay | Admitting: Physician Assistant

## 2020-10-25 ENCOUNTER — Other Ambulatory Visit: Payer: Self-pay

## 2020-10-25 ENCOUNTER — Ambulatory Visit (HOSPITAL_COMMUNITY)
Admission: RE | Admit: 2020-10-25 | Discharge: 2020-10-25 | Disposition: A | Discharge status: Home / Self Care | Payer: Medicare PPO | Source: Ambulatory Visit | Attending: Physician Assistant | Admitting: Physician Assistant

## 2020-10-25 VITALS — BP 128/70 | HR 90 | Ht 65.0 in | Wt 113.6 lb

## 2020-10-25 DIAGNOSIS — G4733 Obstructive sleep apnea (adult) (pediatric): Secondary | ICD-10-CM | POA: Diagnosis not present

## 2020-10-25 DIAGNOSIS — I441 Atrioventricular block, second degree: Secondary | ICD-10-CM | POA: Insufficient documentation

## 2020-10-25 DIAGNOSIS — Z950 Presence of cardiac pacemaker: Secondary | ICD-10-CM | POA: Insufficient documentation

## 2020-10-25 DIAGNOSIS — Z7901 Long term (current) use of anticoagulants: Secondary | ICD-10-CM | POA: Diagnosis not present

## 2020-10-25 DIAGNOSIS — D6869 Other thrombophilia: Secondary | ICD-10-CM | POA: Diagnosis not present

## 2020-10-25 DIAGNOSIS — I082 Rheumatic disorders of both aortic and tricuspid valves: Secondary | ICD-10-CM | POA: Diagnosis not present

## 2020-10-25 DIAGNOSIS — E1122 Type 2 diabetes mellitus with diabetic chronic kidney disease: Secondary | ICD-10-CM | POA: Insufficient documentation

## 2020-10-25 DIAGNOSIS — I4892 Unspecified atrial flutter: Secondary | ICD-10-CM | POA: Insufficient documentation

## 2020-10-25 DIAGNOSIS — I495 Sick sinus syndrome: Secondary | ICD-10-CM | POA: Diagnosis not present

## 2020-10-25 DIAGNOSIS — I4819 Other persistent atrial fibrillation: Secondary | ICD-10-CM | POA: Insufficient documentation

## 2020-10-25 DIAGNOSIS — I484 Atypical atrial flutter: Secondary | ICD-10-CM

## 2020-10-25 DIAGNOSIS — Z79899 Other long term (current) drug therapy: Secondary | ICD-10-CM | POA: Diagnosis not present

## 2020-10-25 DIAGNOSIS — I120 Hypertensive chronic kidney disease with stage 5 chronic kidney disease or end stage renal disease: Secondary | ICD-10-CM | POA: Diagnosis not present

## 2020-10-25 DIAGNOSIS — N186 End stage renal disease: Secondary | ICD-10-CM | POA: Insufficient documentation

## 2020-10-25 MED ORDER — APIXABAN 2.5 MG PO TABS
2.5000 mg | ORAL_TABLET | Freq: Two times a day (BID) | ORAL | 3 refills | Status: DC
Start: 2020-10-25 — End: 2021-01-31

## 2020-10-25 NOTE — Addendum Note (Signed)
Encounter addended by: Juluis Mire, RN on: 10/25/2020 3:46 PM  Actions taken: Pharmacy for encounter modified, Order list changed

## 2020-10-25 NOTE — Progress Notes (Addendum)
Primary Care Physician: Lucianne Lei, MD Primary Cardiologist: Dr Johnsie Cancel Primary Electrophysiologist: Dr Rayann Heman Referring Physician: Device clinic   [name] is a 87 y.o. female with a history of SSS s/p PPM, DM, HTN, ESRD, multiple myeloma, OSA, atrial tachycardia, and atrial fibrillation who presents for follow up in the [facility]. The patient was initially diagnosed with atrial fibrillation 09/2020 on a remote device check. The longest episode was 1 day and 17 hours. She was not aware of her arrhythmia although she does have intermittent SOB. Patient has a CHADS2VASC score of 5. There were no specific triggers that she could identify.   Today, she denies symptoms of palpitations, chest pain, shortness of breath, orthopnea, PND, lower extremity edema, dizziness, presyncope, syncope, bleeding, or neurologic sequela. The patient is tolerating medications without difficulties and is otherwise without complaint today.    Atrial Fibrillation Risk Factors:  she does have symptoms or diagnosis of sleep apnea. she is compliant with CPAP therapy. she does not have a history of rheumatic fever.   she has a BMI of Body mass index is 18.9 kg/m.Marland Kitchen Filed Weights   10/25/20 1115  Weight: 51.5 kg    Family History  Problem Relation Age of Onset  . Congestive Heart Failure Mother   . Tuberculosis Mother   . Multiple myeloma Mother   . Bone cancer Father   . Arthritis Father   . Breast cancer Sister   . Colon cancer Sister   . Hypertension Sister   . Dementia Sister   . Alcohol abuse Son      Atrial Fibrillation Management history:  Previous antiarrhythmic drugs: none Previous cardioversions: none Previous ablations: none CHADS2VASC score: 5 Anticoagulation history: none   Past Medical History:  Diagnosis Date  . Anemia   . Arthritis   . Benign paroxysmal positional vertigo 04/06/2010  . Benign positional vertigo   . BRADYCARDIA  01/27/2009   s/p PPM  . CAROTID BRUIT 02/24/2008  . Complication of anesthesia    took a long time to wake up with knee replacement  . DIABETES MELLITUS, TYPE II 02/24/2008  . ESRD (end stage renal disease) on dialysis ([place])    tues thurs sat nw kidney center sees [place] [facility]  . GERD (gastroesophageal reflux disease)   . Glaucoma   . Gout   . HYPERTENSION 02/24/2008  . Multiple myeloma ([place])   . Obstructive sleep apnea   . Pancytopenia ([place])   . Sick sinus syndrome ([place])   . Wears dentures   . Wears glasses    Past Surgical History:  Procedure Laterality Date  . A/V FISTULAGRAM Right 03/08/2020   Procedure: A/V FISTULAGRAM- Right Arm;  Surgeon: Waynetta Sandy, MD;  Location: [facility];  Service: Cardiovascular;  Laterality: Right;  . A/V FISTULAGRAM N/A 06/28/2020   Procedure: A/V FISTULAGRAM - Right Upper Arm;  Surgeon: Waynetta Sandy, MD;  Location: [place] [facility];  Service: Cardiovascular;  Laterality: N/A;  . ABDOMINAL HYSTERECTOMY     1980's  . AV FISTULA PLACEMENT Right 03/11/2019   Procedure: CREATION RIGHT ARM RADIOCEPHALIC ARTERIOVENOUS FISTULA;  Surgeon: Angelia Mould, MD;  Location: [facility];  Service: Vascular;  Laterality: Right;  . AV FISTULA PLACEMENT Right 03/25/2020   Procedure: right arm ARTERIOVENOUS (AV) FISTULA CREATION;  Surgeon: Waynetta Sandy, MD;  Location: [place];  Service: Vascular;  Laterality: Right;  . BACK SURGERY     x 3  . BIOPSY  06/24/2019   Procedure: BIOPSY;  Surgeon: Ronald Lobo, MD;  Location: [facility];  Service: Endoscopy;;  . EP IMPLANTABLE DEVICE N/A 05/16/2016   Procedure: PPM Generator Changeout;  Surgeon: Thompson Grayer, MD;  Location: [place] [facility];  Service: Cardiovascular;  Laterality: N/A;  . ESOPHAGOGASTRODUODENOSCOPY (EGD) WITH PROPOFOL N/A 06/24/2019   Procedure: ESOPHAGOGASTRODUODENOSCOPY (EGD) WITH PROPOFOL;  Surgeon: Ronald Lobo, MD;  Location: [facility];  Service:  Endoscopy;  Laterality: N/A;  . FISTULA SUPERFICIALIZATION Right 03/11/2019   Procedure: FISTULA SUPERFICIALIZATION;  Surgeon: Angelia Mould, MD;  Location: [place];  Service: Vascular;  Laterality: Right;  . INSERTION OF DIALYSIS CATHETER Right 03/25/2020   Procedure: INSERTION OF DIALYSIS CATHETER, right internal jugular;  Surgeon: Waynetta Sandy, MD;  Location: [place];  Service: Vascular;  Laterality: Right;  . IR FLUORO GUIDE CV LINE RIGHT  03/07/2019  . IR US GUIDE VASC ACCESS RIGHT  03/07/2019  . KNEE SURGERY Right    2003  . KNEE SURGERY Left    2011  . PACEMAKER INSERTION    . PERIPHERAL VASCULAR BALLOON ANGIOPLASTY Right 06/28/2020   Procedure: PERIPHERAL VASCULAR BALLOON ANGIOPLASTY;  Surgeon: Waynetta Sandy, MD;  Location: [place] [facility];  Service: Cardiovascular;  Laterality: Right;    Current Outpatient Medications  Medication Sig Dispense Refill  . acetaminophen (TYLENOL) 500 MG tablet Take 1,000 mg by mouth 2 (two) times daily as needed for mild pain or moderate pain.     Marland Kitchen acyclovir (ZOVIRAX) 400 MG tablet Take 1 tablet (400 mg total) by mouth daily. 90 tablet 6  . allopurinol (ZYLOPRIM) 100 MG tablet Take 0.5 tablets (50 mg total) by mouth daily. 30 tablet 0  . cholecalciferol (VITAMIN D3) 25 MCG (1000 UT) tablet Take 2,000 Units by mouth daily.    . dorzolamide (TRUSOPT) 2 % ophthalmic solution Place 1 drop into the left eye 2 (two) times daily.   1  . fluorometholone (FML) 0.1 % ophthalmic suspension Place 1 drop into the right eye daily.     Marland Kitchen glimepiride (AMARYL) 1 MG tablet Take 0.5 mg by mouth daily.     Marland Kitchen lidocaine-prilocaine (EMLA) cream Apply 1 application topically as needed [facility]).     Marland Kitchen LUMIGAN 0.01 % SOLN Place 1 drop into the left eye at bedtime.     Marland Kitchen LYRICA 50 MG capsule Take 50 mg by mouth at bedtime.    . Methoxy PEG-Epoetin Beta (MIRCERA IJ) Mircera    . midodrine (PROAMATINE) 5 MG tablet Take 5 mg by mouth as directed. Low  bp, and before dialysis    . ondansetron (ZOFRAN) 4 MG tablet Take 1 tablet (4 mg total) by mouth every 8 (eight) hours as needed for nausea. 30 tablet 1  . oxyCODONE (OXY IR/ROXICODONE) 5 MG immediate release tablet Take 1 tablet (5 mg total) by mouth every 4 (four) hours as needed for severe pain. 60 tablet 0  . pantoprazole (PROTONIX) 40 MG tablet Take 1 tablet (40 mg total) by mouth daily as needed (acid reflux/indigestion.). 30 tablet 11  . polyethylene glycol (MIRALAX / GLYCOLAX) 17 g packet Take 17 g by mouth daily.    . prochlorperazine (COMPAZINE) 5 MG tablet Take 1 tablet (5 mg total) by mouth every 6 (six) hours as needed for nausea or vomiting. 90 tablet 1  . senna-docusate (SENOKOT-S) 8.6-50 MG tablet Take 2 tablets by mouth 2 (two) times daily. 30 tablet 0  . sevelamer carbonate (RENVELA) 800 MG tablet Take  1,600 mg by mouth 3 (three) times daily with meals.    . traMADol (ULTRAM) 50 MG tablet Take 2 tablets (100 mg total) by mouth every 6 (six) hours as needed. 60 tablet 0  . aspirin EC 81 MG tablet Take 81 mg by mouth daily. HOLD... (Patient not taking: Reported on 10/25/2020)    . sodium chloride (OCEAN) 0.65 % SOLN nasal spray Place 1 spray into both nostrils 4 (four) times daily as needed for congestion.  (Patient not taking: Reported on 10/25/2020)     Current Facility-Administered Medications  Medication Dose Route Frequency Provider Last Rate Last Admin  . 0.9 %  sodium chloride infusion  250 mL Intravenous PRN Waynetta Sandy, MD        Allergies  Allergen Reactions  . Aspirin Other (See Comments)    REACTION: STOMACH ISSUES WITH DOSE HIGHER THAN 81 MG  Other reaction(s): GI Upset (intolerance), Other (See Comments) REACTION: STOMACH ISSUES WITH DOSE HIGHER THAN 81 MG  REACTION: STOMACH ISSUES WITH DOSE HIGHER THAN 81 MG   . Hydrocodone-Acetaminophen Nausea And Vomiting  . Morphine Nausea And Vomiting  . Penicillins Rash    Patient took injection and tablets,  had a reaction. She has taken amoxicillin with no reaction Has patient had a PCN reaction causing immediate rash, facial/tongue/throat swelling, SOB or lightheadedness with hypotension: Yes Has patient had a PCN reaction causing severe rash involving mucus membranes or skin necrosis: Yes Has patient had a PCN reaction that required hospitalization No Has patient had a PCN reaction occurring within the last 10 years: No If all of the above answers are "NO", then m Patient took injection and tablets, had a reaction. She has taken amoxicillin with no reaction Patient took injection and tablets, had a reaction. She has taken amoxicillin with no reaction Has patient had a PCN reaction causing immediate rash, facial/tongue/throat swelling, SOB or lightheadedness with hypotension: Yes Has patient had a PCN reaction causing severe rash involving mucus membranes or skin necrosis: Yes Has patient had a PCN reaction that required hospitalization No Has patient had a PCN reaction occurring within the last 10 years: No If all of the above answers are "NO", then m  . Betaine Nausea And Vomiting  . Dacarbazine Nausea And Vomiting  . Ketorolac Tromethamine Itching  . Brimonidine Itching  . Diltiazem Hcl Rash  . Hydrocodone-Acetaminophen Nausea And Vomiting  . Neurontin [Gabapentin] Nausea And Vomiting  . Sulfacetamide Sodium Rash    Social History   Socioeconomic History  . Marital status: Married    Spouse name: Not on file  . Number of children: 5  . Years of education: Not on file  . Highest education level: Not on file  Occupational History  . Occupation: retired  Tobacco Use  . Smoking status: Never Smoker  . Smokeless tobacco: Never Used  Vaping Use  . Vaping Use: Never used  Substance and Sexual Activity  . Alcohol use: No  . Drug use: No  . Sexual activity: Not on file  Other Topics Concern  . Not on file  Social History Narrative   Retired Marine scientist   Social Determinants of Adult nurse Strain: Not on file  Food Insecurity: Not on file  Transportation Needs: Not on file  Physical Activity: Not on file  Stress: Not on file  Social Connections: Not on file  Intimate Partner Violence: Not on file     ROS- All systems are reviewed and negative except as  per the HPI above.  Physical Exam: Vitals:   10/25/20 1115  BP: 128/70  Pulse: 90  Weight: 51.5 kg  Height: _0  (1.651 m)    GEN- The patient is a cachectic appearing elderly female, alert and oriented x 3 today.   Head- normocephalic, atraumatic Eyes-  Sclera clear, conjunctiva pink Ears- hearing intact Oropharynx- clear Neck- supple  Lungs- Clear to ausculation bilaterally, normal work of breathing Heart- irregular rate and rhythm, no murmurs, rubs or gallops  GI- soft, NT, ND, + BS Extremities- no clubbing, cyanosis, or edema MS- no significant deformity or atrophy Skin- no rash or lesion Psych- euthymic mood, full affect Neuro- strength and sensation are intact  Wt Readings from Last 3 Encounters:  10/25/20 51.5 kg  10/07/20 52.2 kg  09/15/20 52.4 kg    EKG today demonstrates Atrial flutter with variable block, intermittent V pacing Vent. rate 90 BPM QRS duration 114 ms QT/QTc 388/474 ms  Echo 04/02/20 demonstrated  1. There is significant apical-basal and septal-lateral pacing-induced  dyssynchrony. Left ventricular ejection fraction, by estimation, is 40 to  45%. The left ventricle has mildly decreased function. The left ventricle  demonstrates regional wall motion  abnormalities (see scoring diagram/findings for description). There is  moderate concentric left ventricular hypertrophy. Left ventricular  diastolic parameters are consistent with Grade II diastolic dysfunction  (pseudonormalization). Elevated left atrial  pressure. There is moderate dyskinesis of the left ventricular, entire  apical segment, although this may be artifactual (pacing related).  2. Right  ventricular systolic function is normal. The right ventricular  size is normal. Mildly increased right ventricular wall thickness. There  is mildly elevated pulmonary artery systolic pressure. The estimated right  ventricular systolic pressure is  92.1 mmHg.  3. Left atrial size was mildly dilated.  4. Right atrial size was mildly dilated.  5. The mitral valve is normal in structure. Mild mitral valve  regurgitation.  6. Tricuspid valve regurgitation is mild to moderate.  7. The aortic valve is tricuspid. Aortic valve regurgitation is trivial.  Very mild aortic valve stenosis.  8. The inferior vena cava is normal in size with greater than 50%  respiratory variability, suggesting right atrial pressure of 3 mmHg.   Comparison(s): Prior images reviewed side by side. The left ventricular  function is worsened. The difference is at least in part (possibly  entirely) due to ventricular pacing (which was present only intermittently  on the 03/01/2019 study, but is seen  constantly throughout the current study).   Epic records are reviewed at length today  CHA2DS2-VASc Score = 5  The patient's score is based upon: CHF History: No HTN History: Yes Diabetes History: Yes Stroke History: No Vascular Disease History: No Age Score: 2 Gender Score: 1      ASSESSMENT AND PLAN: 1. Persistent Atrial Fibrillation/atach/atrial flutter The patient's CHA2DS2-VASc score is 5, indicating a 7.2% annual risk of stroke.   General education about afib provided and questions answered. We also discussed her stroke risk and the risks and benefits of anticoagulation. Will plan to start Eliquis 2.5 mg BID (age, weight, Cr >1.5). She is no longer taking ASA. We discussed rate vs rhythm control. Will need to wait at least 3 weeks of uninterrupted anticoagulation if rhythm control is attempted. Will monitor symptoms in meantime.  2. Secondary Hypercoagulable State (ICD10:  D68.69) The patient is at  significant risk for stroke/thromboembolism based upon her CHA2DS2-VASc Score of 5.  Start Apixaban (Eliquis).   3. SSS/2nd degree AV  block S/p PPM, followed by Dr Rayann Heman and the device clinic.  4. Obstructive sleep apnea The importance of adequate treatment of sleep apnea was discussed today in order to improve our ability to maintain sinus rhythm long term. Patient reports compliance with CPAP therapy.  5. ESRD HD TTS   Follow up in the AF clinic in 2 weeks.    [facility] [address] [place], [place] [address] [phone] 10/25/2020 2:21 PM

## 2020-10-27 ENCOUNTER — Other Ambulatory Visit: Payer: Self-pay

## 2020-10-27 ENCOUNTER — Encounter: Payer: Self-pay | Admitting: Hematology and Oncology

## 2020-10-27 ENCOUNTER — Inpatient Hospital Stay: Payer: Medicare PPO

## 2020-10-27 ENCOUNTER — Inpatient Hospital Stay: Payer: Medicare PPO | Attending: Hematology and Oncology | Admitting: Hematology and Oncology

## 2020-10-27 DIAGNOSIS — Z79899 Other long term (current) drug therapy: Secondary | ICD-10-CM | POA: Insufficient documentation

## 2020-10-27 DIAGNOSIS — D61818 Other pancytopenia: Secondary | ICD-10-CM | POA: Diagnosis not present

## 2020-10-27 DIAGNOSIS — K30 Functional dyspepsia: Secondary | ICD-10-CM | POA: Diagnosis not present

## 2020-10-27 DIAGNOSIS — C9000 Multiple myeloma not having achieved remission: Secondary | ICD-10-CM

## 2020-10-27 DIAGNOSIS — K2970 Gastritis, unspecified, without bleeding: Secondary | ICD-10-CM | POA: Diagnosis not present

## 2020-10-27 DIAGNOSIS — Z7901 Long term (current) use of anticoagulants: Secondary | ICD-10-CM | POA: Diagnosis not present

## 2020-10-27 DIAGNOSIS — M25559 Pain in unspecified hip: Secondary | ICD-10-CM | POA: Insufficient documentation

## 2020-10-27 DIAGNOSIS — K5900 Constipation, unspecified: Secondary | ICD-10-CM | POA: Insufficient documentation

## 2020-10-27 LAB — CBC WITH DIFFERENTIAL/PLATELET
Abs Immature Granulocytes: 0.01 10*3/uL (ref 0.00–0.07)
Basophils Absolute: 0 10*3/uL (ref 0.0–0.1)
Basophils Relative: 1 %
Eosinophils Absolute: 0 10*3/uL (ref 0.0–0.5)
Eosinophils Relative: 1 %
HCT: 32.9 % — ABNORMAL LOW (ref 36.0–46.0)
Hemoglobin: 10.9 g/dL — ABNORMAL LOW (ref 12.0–15.0)
Immature Granulocytes: 0 %
Lymphocytes Relative: 31 %
Lymphs Abs: 1.6 10*3/uL (ref 0.7–4.0)
MCH: 29.1 pg (ref 26.0–34.0)
MCHC: 33.1 g/dL (ref 30.0–36.0)
MCV: 88 fL (ref 80.0–100.0)
Monocytes Absolute: 0.4 10*3/uL (ref 0.1–1.0)
Monocytes Relative: 8 %
Neutro Abs: 3.1 10*3/uL (ref 1.7–7.7)
Neutrophils Relative %: 59 %
Platelets: 136 10*3/uL — ABNORMAL LOW (ref 150–400)
RBC: 3.74 MIL/uL — ABNORMAL LOW (ref 3.87–5.11)
RDW: 16.6 % — ABNORMAL HIGH (ref 11.5–15.5)
WBC: 5.2 10*3/uL (ref 4.0–10.5)
nRBC: 0 % (ref 0.0–0.2)

## 2020-10-27 LAB — COMPREHENSIVE METABOLIC PANEL
ALT: 9 U/L (ref 0–44)
AST: 21 U/L (ref 15–41)
Albumin: 3.7 g/dL (ref 3.5–5.0)
Alkaline Phosphatase: 81 U/L (ref 38–126)
Anion gap: 8 (ref 5–15)
BUN: 13 mg/dL (ref 8–23)
CO2: 33 mmol/L — ABNORMAL HIGH (ref 22–32)
Calcium: 8.8 mg/dL — ABNORMAL LOW (ref 8.9–10.3)
Chloride: 96 mmol/L — ABNORMAL LOW (ref 98–111)
Creatinine, Ser: 2.67 mg/dL — ABNORMAL HIGH (ref 0.44–1.00)
GFR, Estimated: 17 mL/min — ABNORMAL LOW (ref >60)
Glucose, Bld: 111 mg/dL — ABNORMAL HIGH (ref 70–99)
Potassium: 3.4 mmol/L — ABNORMAL LOW (ref 3.5–5.1)
Sodium: 137 mmol/L (ref 135–145)
Total Bilirubin: 0.7 mg/dL (ref 0.3–1.2)
Total Protein: 6 g/dL — ABNORMAL LOW (ref 6.5–8.1)

## 2020-10-27 MED ORDER — ONDANSETRON HCL 4 MG PO TABS
4.0000 mg | ORAL_TABLET | Freq: Three times a day (TID) | ORAL | 1 refills | Status: DC | PRN
Start: 2020-10-27 — End: 2021-10-26

## 2020-10-27 MED ORDER — METOCLOPRAMIDE HCL 5 MG PO TABS: 5.0000 mg | ORAL_TABLET | Freq: Three times a day (TID) | ORAL | 1 refills | Status: DC

## 2020-10-27 NOTE — Assessment & Plan Note (Signed)
Her recent nausea and vomiting around mealtime could be due to delayed gastric emptying I recommend a trial of Reglan We also discussed possible GI referral I will see her again next month for further follow-up

## 2020-10-27 NOTE — Progress Notes (Signed)
[facility] OFFICE PROGRESS NOTE  Patient Care Team: Bland, Veita, MD as PCP - General (Family Medicine) Allred, James, MD as PCP - Electrophysiology (Cardiology) shan, Peter C, MD as PCP - Cardiology (Cardiology)  ASSESSMENT & PLAN:  Multiple myeloma not having achieved remission (HCC) Her last myeloma panel showed near complete response Her myeloma panel is rechecked again, pending today I will call her son with test results In the meantime, she is not symptomatic I plan to see her every month and monitor her myeloma panel closely She is in agreement  Pancytopenia, acquired (HCC) She has multifactorial pancytopenia likely due to recent treatment She does not need transfusion support We will monitor closely  Gastritis Suspect she might have symptoms of gastritis I recommend a trial of omeprazole  Delayed gastric emptying Her recent nausea and vomiting around mealtime could be due to delayed gastric emptying I recommend a trial of Reglan We also discussed possible GI referral I will see her again next month for further follow-up   No orders of the defined types were placed in this encounter.   All questions were answered. The patient knows to call the clinic with any problems, questions or concerns. The total time spent in the appointment was 25 minutes encounter with patients including review of chart and various tests results, discussions about plan of care and coordination of care plan    , MD 10/27/2020 3:26 PM  INTERVAL HISTORY: Please see below for problem oriented charting. She returns with her son for further follow-up Her appetite is fair although she had recent gastritis/vomiting around noon time She have occasional persistent constipation Her hip pain is stable I reviewed her recent cardiology follow-up She is taking apixaban to prevent stroke SUMMARY OF ONCOLOGIC HISTORY: Oncology History  Multiple myeloma not having achieved  remission (HCC)  03/03/2019 Initial Diagnosis   Multiple myeloma not having achieved remission (HCC)   03/03/2019 Imaging   1. Round and oval lucent/lytic lesions in the skull, bilateral humeri, distal left femur, and possibly also in the pelvis are compatible with multiple myeloma. No right lower extremity lytic lesion identified. No pathologic fracture. 2. Advanced degenerative changes in the spine with no vertebral myeloma evident by plain radiography. 3. Advanced degenerative changes in the right upper extremity. Bilateral knee arthroplasty. 4.  Aortic Atherosclerosis (ICD10-I70.0).   03/04/2019 -  Chemotherapy   The patient had bortezomib for chemotherapy treatment.    03/04/2019 Bone Marrow Biopsy   Bone Marrow, Aspirate,Biopsy, and Clot - HYPERCELLULAR BONE MARROW FOR AGE WITH PLASMACYTOSIS. - SEE COMMENT. PERIPHERAL BLOOD: - NORMOCYTIC-NORMOCHROMIC ANEMIA. Diagnosis Note The bone marrow is hypercellular for age with increased number of atypical plasma cells averaging 30% of all cells in the aspirate associated with interstitial infiltrates and numerous variably sized aggregates in the clot and biopsy sections. 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 Confirmatory in situ hybridization for kappa and lambda as well as CD138 will be performed and the results reported in an addendum. The background shows trilineage hematopoiesis with mild non-specific changes primarily involving the erythroid series. Correlation with cytogenetic and FISH studies is recommended.    06/20/2019 -  Chemotherapy   The patient had dexamethasone (DECADRON) tablet 20 mg, 20 mg (100 % of original dose 20 mg), Oral,  Once, 19 of 21 cycles Dose modification: 20 mg (original dose 20 mg, Cycle 1), 8 mg (original dose 8 mg, Cycle 10) Administration: 20 mg (06/20/2019), 20 mg (06/27/2019), 20 mg (07/04/2019), 20 mg (  07/11/2019), 20 mg (07/18/2019), 20 mg (07/25/2019), 20 mg (08/08/2019), 20 mg (08/15/2019), 20 mg  (09/05/2019), 20 mg (12/01/2019), 20 mg (09/26/2019), 20 mg (10/20/2019), 20 mg (11/10/2019), 20 mg (12/22/2019), 20 mg (01/12/2020), 8 mg (02/02/2020), 8 mg (02/23/2020), 8 mg (03/22/2020), 8 mg (04/12/2020), 8 mg (05/03/2020), 8 mg (05/31/2020), 8 mg (06/23/2020), 8 mg (07/14/2020), 8 mg (08/04/2020), 8 mg (08/25/2020) daratumumab-hyaluronidase-fihj (DARZALEX FASPRO) 1800-30000 MG-UT/15ML chemo SQ injection 1,800 mg, 1,800 mg, Subcutaneous,  Once, 19 of 21 cycles Administration: 1,800 mg (06/20/2019), 1,800 mg (06/27/2019), 1,800 mg (07/04/2019), 1,800 mg (07/11/2019), 1,800 mg (07/18/2019), 1,800 mg (07/25/2019), 1,800 mg (08/08/2019), 1,800 mg (08/15/2019), 1,800 mg (09/05/2019), 1,800 mg (12/01/2019), 1,800 mg (02/02/2020), 1,800 mg (09/26/2019), 1,800 mg (10/20/2019), 1,800 mg (11/10/2019), 1,800 mg (12/22/2019), 1,800 mg (01/12/2020), 1,800 mg (02/23/2020), 1,800 mg (03/22/2020), 1,800 mg (04/12/2020), 1,800 mg (05/03/2020), 1,800 mg (05/31/2020), 1,800 mg (06/23/2020), 1,800 mg (07/14/2020), 1,800 mg (08/04/2020), 1,800 mg (08/25/2020)  for chemotherapy treatment.      REVIEW OF SYSTEMS:   Constitutional: Denies fevers, chills or abnormal weight loss Eyes: Denies blurriness of vision Ears, nose, mouth, throat, and face: Denies mucositis or sore throat Respiratory: Denies cough, dyspnea or wheezes Cardiovascular: Denies palpitation, chest discomfort or lower extremity swelling Skin: Denies abnormal skin rashes Lymphatics: Denies new lymphadenopathy or easy bruising Neurological:Denies numbness, tingling or new weaknesses Behavioral/Psych: Mood is stable, no new changes  All other systems were reviewed with the patient and are negative.  I have reviewed the past medical history, past surgical history, social history and family history with the patient and they are unchanged from previous note.  ALLERGIES:  is allergic to aspirin, hydrocodone-acetaminophen, morphine, penicillins, betaine, dacarbazine, ketorolac tromethamine,  brimonidine, diltiazem hcl, hydrocodone-acetaminophen, neurontin [gabapentin], and sulfacetamide sodium.  MEDICATIONS:  Current Outpatient Medications  Medication Sig Dispense Refill  . metoCLOPramide (REGLAN) 5 MG tablet Take 1 tablet (5 mg total) by mouth 3 (three) times daily before meals. 90 tablet 1  . acetaminophen (TYLENOL) 500 MG tablet Take 1,000 mg by mouth 2 (two) times daily as needed for mild pain or moderate pain.     . acyclovir (ZOVIRAX) 400 MG tablet Take 1 tablet (400 mg total) by mouth daily. 90 tablet 6  . allopurinol (ZYLOPRIM) 100 MG tablet Take 0.5 tablets (50 mg total) by mouth daily. 30 tablet 0  . apixaban (ELIQUIS) 2.5 MG TABS tablet Take 1 tablet (2.5 mg total) by mouth 2 (two) times daily. 60 tablet 3  . cholecalciferol (VITAMIN D3) 25 MCG (1000 UT) tablet Take 2,000 Units by mouth daily.    . dorzolamide (TRUSOPT) 2 % ophthalmic solution Place 1 drop into the left eye 2 (two) times daily.   1  . fluorometholone (FML) 0.1 % ophthalmic suspension Place 1 drop into the right eye daily.     . glimepiride (AMARYL) 1 MG tablet Take 0.5 mg by mouth daily.     . lidocaine-prilocaine (EMLA) cream Apply 1 application topically as needed (Port).     . LUMIGAN 0.01 % SOLN Place 1 drop into the left eye at bedtime.     . LYRICA 50 MG capsule Take 50 mg by mouth at bedtime.    . Methoxy PEG-Epoetin Beta (MIRCERA IJ) Mircera    . midodrine (PROAMATINE) 5 MG tablet Take 5 mg by mouth as directed. Low bp, and before dialysis    . ondansetron (ZOFRAN) 4 MG tablet Take 1 tablet (4 mg total) by mouth every 8 (eight) hours as needed for nausea.   60 tablet 1  . oxyCODONE (OXY IR/ROXICODONE) 5 MG immediate release tablet Take 1 tablet (5 mg total) by mouth every 4 (four) hours as needed for severe pain. 60 tablet 0  . pantoprazole (PROTONIX) 40 MG tablet Take 1 tablet (40 mg total) by mouth daily as needed (acid reflux/indigestion.). 30 tablet 11  . polyethylene glycol (MIRALAX /  GLYCOLAX) 17 g packet Take 17 g by mouth daily.    . senna-docusate (SENOKOT-S) 8.6-50 MG tablet Take 2 tablets by mouth 2 (two) times daily. 30 tablet 0  . sevelamer carbonate (RENVELA) 800 MG tablet Take 1,600 mg by mouth 3 (three) times daily with meals.    . sodium chloride (OCEAN) 0.65 % SOLN nasal spray Place 1 spray into both nostrils 4 (four) times daily as needed for congestion.  (Patient not taking: Reported on 10/25/2020)     Current Facility-Administered Medications  Medication Dose Route Frequency Provider Last Rate Last Admin  . 0.9 %  sodium chloride infusion  250 mL Intravenous PRN Cain, Brandon Christopher, MD        PHYSICAL EXAMINATION: ECOG PERFORMANCE STATUS: 2 - Symptomatic, <50% confined to bed  Vitals:   10/27/20 1119  BP: (!) 104/49  Pulse: 79  Resp: 18  Temp: 97.8 F (36.6 C)  SpO2: 100%   Filed Weights   10/27/20 1119  Weight: 113 lb 9.6 oz (51.5 kg)    GENERAL:alert, no distress and comfortable NEURO: alert & oriented x 3 with fluent speech, no focal motor/sensory deficits  LABORATORY DATA:  I have reviewed the data as listed    Component Value Date/Time   NA 137 10/27/2020 1054   K 3.4 (L) 10/27/2020 1054   CL 96 (L) 10/27/2020 1054   CO2 33 (H) 10/27/2020 1054   GLUCOSE 111 (H) 10/27/2020 1054   BUN 13 10/27/2020 1054   CREATININE 2.67 (H) 10/27/2020 1054   CREATININE 2.71 (H) 10/20/2019 1128   CREATININE 1.03 (H) 05/11/2016 0931   CALCIUM 8.8 (L) 10/27/2020 1054   PROT 6.0 (L) 10/27/2020 1054   ALBUMIN 3.7 10/27/2020 1054   AST 21 10/27/2020 1054   AST 18 10/20/2019 1128   ALT 9 10/27/2020 1054   ALT 10 10/20/2019 1128   ALKPHOS 81 10/27/2020 1054   BILITOT 0.7 10/27/2020 1054   BILITOT 0.4 10/20/2019 1128   GFRNONAA 17 (L) 10/27/2020 1054   GFRNONAA 15 (L) 10/20/2019 1128   GFRAA 14 (L) 07/14/2020 1021   GFRAA 18 (L) 10/20/2019 1128    No results found for: SPEP, UPEP  Lab Results  Component Value Date   WBC 5.2 10/27/2020    NEUTROABS 3.1 10/27/2020   HGB 10.9 (L) 10/27/2020   HCT 32.9 (L) 10/27/2020   MCV 88.0 10/27/2020   PLT 136 (L) 10/27/2020      Chemistry      Component Value Date/Time   NA 137 10/27/2020 1054   K 3.4 (L) 10/27/2020 1054   CL 96 (L) 10/27/2020 1054   CO2 33 (H) 10/27/2020 1054   BUN 13 10/27/2020 1054   CREATININE 2.67 (H) 10/27/2020 1054   CREATININE 2.71 (H) 10/20/2019 1128   CREATININE 1.03 (H) 05/11/2016 0931      Component Value Date/Time   CALCIUM 8.8 (L) 10/27/2020 1054   ALKPHOS 81 10/27/2020 1054   AST 21 10/27/2020 1054   AST 18 10/20/2019 1128   ALT 9 10/27/2020 1054   ALT 10 10/20/2019 1128   BILITOT 0.7 10/27/2020 1054     BILITOT 0.4 10/20/2019 1128       

## 2020-10-27 NOTE — Assessment & Plan Note (Signed)
Her last myeloma panel showed near complete response Her myeloma panel is rechecked again, pending today I will call her son with test results In the meantime, she is not symptomatic I plan to see her every month and monitor her myeloma panel closely She is in agreement

## 2020-10-27 NOTE — Assessment & Plan Note (Signed)
Suspect she might have symptoms of gastritis I recommend a trial of omeprazole

## 2020-10-27 NOTE — Assessment & Plan Note (Signed)
She has multifactorial pancytopenia likely due to recent treatment She does not need transfusion support We will monitor closely

## 2020-10-28 LAB — KAPPA/LAMBDA LIGHT CHAINS
Kappa free light chain: 32.3 mg/L — ABNORMAL HIGH (ref 3.3–19.4)
Kappa, lambda light chain ratio: 2.02 — ABNORMAL HIGH (ref 0.26–1.65)
Lambda free light chains: 16 mg/L (ref 5.7–26.3)

## 2020-10-29 ENCOUNTER — Other Ambulatory Visit: Payer: Medicare PPO

## 2020-10-29 ENCOUNTER — Ambulatory Visit: Payer: Medicare PPO | Admitting: Hematology and Oncology

## 2020-10-29 LAB — MULTIPLE MYELOMA PANEL, SERUM
Albumin SerPl Elph-Mcnc: 3.7 g/dL (ref 2.9–4.4)
Albumin/Glob SerPl: 1.8 — ABNORMAL HIGH (ref 0.7–1.7)
Alpha 1: 0.2 g/dL (ref 0.0–0.4)
Alpha2 Glob SerPl Elph-Mcnc: 0.6 g/dL (ref 0.4–1.0)
B-Globulin SerPl Elph-Mcnc: 0.9 g/dL (ref 0.7–1.3)
Gamma Glob SerPl Elph-Mcnc: 0.3 g/dL — ABNORMAL LOW (ref 0.4–1.8)
Globulin, Total: 2.1 g/dL — ABNORMAL LOW (ref 2.2–3.9)
IgA: 24 mg/dL — ABNORMAL LOW (ref 64–422)
IgG (Immunoglobin G), Serum: 704 mg/dL (ref 586–1602)
IgM (Immunoglobulin M), Srm: 13 mg/dL — ABNORMAL LOW (ref 26–217)
M Protein SerPl Elph-Mcnc: 0.2 g/dL — ABNORMAL HIGH
Total Protein ELP: 5.8 g/dL — ABNORMAL LOW (ref 6.0–8.5)

## 2020-11-01 ENCOUNTER — Telehealth: Payer: Self-pay

## 2020-11-01 NOTE — Telephone Encounter (Signed)
Called and given below message to son. He verbalized understanding . ?

## 2020-11-01 NOTE — Telephone Encounter (Signed)
-----   Message from Heath Lark, MD sent at 11/01/2020 11:14 AM EST ----- Regarding: myeloma panel is good Pls call her or son Myeloma panel continues to look good

## 2020-11-04 LAB — CUP PACEART REMOTE DEVICE CHECK
Battery Remaining Longevity: 88 mo
Battery Remaining Percentage: 95.5 %
Battery Voltage: 2.96 V
Brady Statistic AP VP Percent: 77 %
Brady Statistic AP VS Percent: 1 %
Brady Statistic AS VP Percent: 19 %
Brady Statistic AS VS Percent: 2.5 %
Brady Statistic RA Percent Paced: 32 %
Brady Statistic RV Percent Paced: 82 %
Date Time Interrogation Session: 20220120022921
Implantable Lead Implant Date: 20100422
Implantable Lead Implant Date: 20100422
Implantable Lead Location: 753859
Implantable Lead Location: 753860
Implantable Pulse Generator Implant Date: 20170801
Lead Channel Impedance Value: 350 Ohm
Lead Channel Impedance Value: 380 Ohm
Lead Channel Pacing Threshold Amplitude: 0.75 V
Lead Channel Pacing Threshold Amplitude: 0.75 V
Lead Channel Pacing Threshold Pulse Width: 0.5 ms
Lead Channel Pacing Threshold Pulse Width: 0.5 ms
Lead Channel Sensing Intrinsic Amplitude: 1.2 mV
Lead Channel Sensing Intrinsic Amplitude: 6.9 mV
Lead Channel Setting Pacing Amplitude: 2 V
Lead Channel Setting Pacing Amplitude: 2.5 V
Lead Channel Setting Pacing Pulse Width: 0.5 ms
Lead Channel Setting Sensing Sensitivity: 2 mV
Pulse Gen Model: 2272
Pulse Gen Serial Number: 7929552

## 2020-11-05 ENCOUNTER — Ambulatory Visit (INDEPENDENT_AMBULATORY_CARE_PROVIDER_SITE_OTHER): Payer: Medicare PPO

## 2020-11-05 DIAGNOSIS — I495 Sick sinus syndrome: Secondary | ICD-10-CM | POA: Diagnosis not present

## 2020-11-08 ENCOUNTER — Encounter (HOSPITAL_COMMUNITY): Payer: Self-pay | Admitting: Physician Assistant

## 2020-11-08 ENCOUNTER — Ambulatory Visit (HOSPITAL_COMMUNITY)
Admission: RE | Admit: 2020-11-08 | Discharge: 2020-11-08 | Disposition: A | Discharge status: Home / Self Care | Payer: Medicare PPO | Source: Ambulatory Visit | Attending: Physician Assistant | Admitting: Physician Assistant

## 2020-11-08 ENCOUNTER — Other Ambulatory Visit: Payer: Self-pay

## 2020-11-08 VITALS — BP 112/60 | HR 76 | Ht 65.0 in | Wt 110.2 lb

## 2020-11-08 DIAGNOSIS — G4733 Obstructive sleep apnea (adult) (pediatric): Secondary | ICD-10-CM | POA: Insufficient documentation

## 2020-11-08 DIAGNOSIS — D6869 Other thrombophilia: Secondary | ICD-10-CM | POA: Insufficient documentation

## 2020-11-08 DIAGNOSIS — I081 Rheumatic disorders of both mitral and tricuspid valves: Secondary | ICD-10-CM | POA: Diagnosis not present

## 2020-11-08 DIAGNOSIS — Z992 Dependence on renal dialysis: Secondary | ICD-10-CM | POA: Insufficient documentation

## 2020-11-08 DIAGNOSIS — I441 Atrioventricular block, second degree: Secondary | ICD-10-CM | POA: Insufficient documentation

## 2020-11-08 DIAGNOSIS — I495 Sick sinus syndrome: Secondary | ICD-10-CM | POA: Insufficient documentation

## 2020-11-08 DIAGNOSIS — N186 End stage renal disease: Secondary | ICD-10-CM | POA: Insufficient documentation

## 2020-11-08 DIAGNOSIS — I4819 Other persistent atrial fibrillation: Secondary | ICD-10-CM | POA: Insufficient documentation

## 2020-11-08 DIAGNOSIS — Z79899 Other long term (current) drug therapy: Secondary | ICD-10-CM | POA: Diagnosis not present

## 2020-11-08 DIAGNOSIS — R112 Nausea with vomiting, unspecified: Secondary | ICD-10-CM | POA: Insufficient documentation

## 2020-11-08 DIAGNOSIS — I4892 Unspecified atrial flutter: Secondary | ICD-10-CM | POA: Insufficient documentation

## 2020-11-08 DIAGNOSIS — I120 Hypertensive chronic kidney disease with stage 5 chronic kidney disease or end stage renal disease: Secondary | ICD-10-CM | POA: Insufficient documentation

## 2020-11-08 DIAGNOSIS — E1122 Type 2 diabetes mellitus with diabetic chronic kidney disease: Secondary | ICD-10-CM | POA: Insufficient documentation

## 2020-11-08 DIAGNOSIS — Z950 Presence of cardiac pacemaker: Secondary | ICD-10-CM | POA: Diagnosis not present

## 2020-11-08 DIAGNOSIS — Z7901 Long term (current) use of anticoagulants: Secondary | ICD-10-CM | POA: Diagnosis not present

## 2020-11-08 NOTE — Progress Notes (Signed)
Primary Care Physician: Lucianne Lei, MD Primary Cardiologist: Dr Johnsie Cancel Primary Electrophysiologist: Dr Rayann Heman Referring Physician: Device clinic   [name] is a 87 y.o. female with a history of SSS s/p PPM, DM, HTN, ESRD, multiple myeloma, OSA, atrial tachycardia, and atrial fibrillation who presents for follow up in the [facility]. The patient was initially diagnosed with atrial fibrillation 09/2020 on a remote device check. The longest episode was 1 day and 17 hours. She was not aware of her arrhythmia although she does have intermittent SOB. Patient has a CHADS2VASC score of 5. There were no specific triggers that she could identify.   On follow up today, patient reports she has done reasonably well since her last visit. She denies any awareness of her arrhythmia. She does have chronic nausea and vomiting. She denies any bleeding issues since starting anticoagulation.   Today, she denies symptoms of palpitations, chest pain, shortness of breath, orthopnea, PND, lower extremity edema, dizziness, presyncope, syncope, bleeding, or neurologic sequela. The patient is tolerating medications without difficulties and is otherwise without complaint today.    Atrial Fibrillation Risk Factors:  she does have symptoms or diagnosis of sleep apnea. she is compliant with CPAP therapy. she does not have a history of rheumatic fever.   she has a BMI of Body mass index is 18.34 kg/m.Marland Kitchen Filed Weights   11/08/20 1107  Weight: 50 kg    Family History  Problem Relation Age of Onset  . Congestive Heart Failure Mother   . Tuberculosis Mother   . Multiple myeloma Mother   . Bone cancer Father   . Arthritis Father   . Breast cancer Sister   . Colon cancer Sister   . Hypertension Sister   . Dementia Sister   . Alcohol abuse Son      Atrial Fibrillation Management history:  Previous antiarrhythmic drugs: none Previous cardioversions: none Previous  ablations: none CHADS2VASC score: 5 Anticoagulation history: Eliquis   Past Medical History:  Diagnosis Date  . Anemia   . Arthritis   . Benign paroxysmal positional vertigo 04/06/2010  . Benign positional vertigo   . 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 CAROTID BRUIT 02/24/2008  . Complication of anesthesia    took a long time to wake up with knee replacement  . DIABETES MELLITUS, TYPE II 02/24/2008  . ESRD (end stage renal disease) on dialysis ([place])    tues thurs sat [facility]w kidney center sees [place] [facility]  . GERD (gastroesophageal reflux disease)   . Glaucoma   . Gout   . HYPERTENSION 02/24/2008  . Multiple myeloma ([place])   . Obstructive sleep apnea   . Pancytopenia ([place])   . Sick sinus syndrome ([place])   . Wears dentures   . Wears glasses    Past Surgical History:  Procedure Laterality Date  . A/V FISTULAGRAM Right 03/08/2020   Procedure: A/V FISTULAGRAM- Right Arm;  Surgeon: Waynetta Sandy, MD;  Location: [place] [facility];  Service: Cardiovascular;  Laterality: Right;  . A/V FISTULAGRAM N/A 06/28/2020   Procedure: A/V FISTULAGRAM - Right Upper Arm;  Surgeon: Waynetta Sandy, MD;  Location: [place] [facility];  Service: Cardiovascular;  Laterality: N/A;  . ABDOMINAL HYSTERECTOMY     1980's  . AV FISTULA PLACEMENT Right 03/11/2019   Procedure: CREATION RIGHT ARM RADIOCEPHALIC ARTERIOVENOUS FISTULA;  Surgeon: Angelia Mould, MD;  Location[facility];  Service: Vascular;  Laterality: Right;  . AV FISTULA PLACEMENT Right  03/25/2020   Procedure: right arm ARTERIOVENOUS (AV) FISTULA CREATION;  Surgeon: Waynetta Sandy, MD;  Location: [place];  Service: Vascular;  Laterality: Right;  . BACK SURGERY     x 3  . BIOPSY  06/24/2019   Procedure: BIOPSY;  Surgeon: Ronald Lobo, MD;  Location: [facility];  Service: Endoscopy;;  . EP IMPLANTABLE DEVICE N/A 05/16/2016   Procedure: PPM Generator Changeout;  Surgeon: Thompson Grayer, MD;  Location: [place] [facility];  Service: Cardiovascular;  Laterality: N/A;  . ESOPHAGOGASTRODUODENOSCOPY (EGD) WITH PROPOFOL N/A 06/24/2019   Procedure: ESOPHAGOGASTRODUODENOSCOPY (EGD) WITH PROPOFOL;  Surgeon: Ronald Lobo, MD;  Location: [facility];  Service: Endoscopy;  Laterality: N/A;  . FISTULA SUPERFICIALIZATION Right 03/11/2019   Procedure: FISTULA SUPERFICIALIZATION;  Surgeon: Angelia Mould, MD;  Location: [place];  Service: Vascular;  Laterality: Right;  . INSERTION OF DIALYSIS CATHETER Right 03/25/2020   Procedure: INSERTION OF DIALYSIS CATHETER, right internal jugular;  Surgeon: Waynetta Sandy, MD;  Location: [place];  Service: Vascular;  Laterality: Right;  . IR FLUORO GUIDE CV LINE RIGHT  03/07/2019  . IR US GUIDE VASC ACCESS RIGHT  03/07/2019  . KNEE SURGERY Right    2003  . KNEE SURGERY Left    2011  . PACEMAKER INSERTION    . PERIPHERAL VASCULAR BALLOON ANGIOPLASTY Right 06/28/2020   Procedure: PERIPHERAL VASCULAR BALLOON ANGIOPLASTY;  Surgeon: Waynetta Sandy, MD;  Location: [place];  Service: Cardiovascular;  Laterality: Right;    Current Outpatient Medications  Medication Sig Dispense Refill  . acetaminophen (TYLENOL) 500 MG tablet Take 1,000 mg by mouth 2 (two) times daily as needed for mild pain or moderate pain.     Marland Kitchen acyclovir (ZOVIRAX) 400 MG tablet Take 1 tablet (400 mg total) by mouth daily. 90 tablet 6  . allopurinol (ZYLOPRIM) 100 MG tablet Take 0.5 tablets (50 mg total) by mouth daily. 30 tablet 0  . apixaban (ELIQUIS) 2.5 MG TABS tablet Take 1 tablet (2.5 mg total) by mouth 2 (two) times daily. 60 tablet 3  . cholecalciferol (VITAMIN D3) 25 MCG (1000 UT) tablet Take 2,000 Units by mouth daily.    . dorzolamide (TRUSOPT) 2 % ophthalmic solution Place 1 drop into the left eye 2 (two) times daily.   1  . fluorometholone (FML) 0.1 % ophthalmic suspension Place 1 drop into the right eye daily.     Marland Kitchen glimepiride (AMARYL) 1 MG tablet Take 0.5 mg by mouth  daily.     Marland Kitchen lidocaine-prilocaine (EMLA) cream Apply 1 application topically as needed [facility]).     Marland Kitchen LUMIGAN 0.01 % SOLN Place 1 drop into the left eye at bedtime.     Marland Kitchen LYRICA 50 MG capsule Take 50 mg by mouth at bedtime.    . Methoxy PEG-Epoetin Beta (MIRCERA IJ) Mircera    . metoCLOPramide (REGLAN) 5 MG tablet Take 1 tablet (5 mg total) by mouth 3 (three) times daily before meals. 90 tablet 1  . midodrine (PROAMATINE) 5 MG tablet Take 5 mg by mouth as directed. Low bp, and before dialysis    . ondansetron (ZOFRAN) 4 MG tablet Take 1 tablet (4 mg total) by mouth every 8 (eight) hours as needed for nausea. 60 tablet 1  . oxyCODONE (OXY IR/ROXICODONE) 5 MG immediate release tablet Take 1 tablet (5 mg total) by mouth every 4 (four) hours as needed for severe pain. 60 tablet 0  . pantoprazole (PROTONIX) 40 MG tablet Take 1 tablet (  40 mg total) by mouth daily as needed (acid reflux/indigestion.). 30 tablet 11  . polyethylene glycol (MIRALAX / GLYCOLAX) 17 g packet Take 17 g by mouth daily.    Marland Kitchen senna-docusate (SENOKOT-S) 8.6-50 MG tablet Take 2 tablets by mouth 2 (two) times daily. 30 tablet 0  . sevelamer carbonate (RENVELA) 800 MG tablet Take 1,600 mg by mouth 3 (three) times daily with meals.    . sodium chloride (OCEAN) 0.65 % SOLN nasal spray Place 1 spray into both nostrils 4 (four) times daily as needed for congestion.     Current Facility-Administered Medications  Medication Dose Route Frequency Provider Last Rate Last Admin  . 0.9 %  sodium chloride infusion  250 mL Intravenous PRN Waynetta Sandy, MD        Allergies  Allergen Reactions  . Aspirin Other (See Comments)    REACTION: STOMACH ISSUES WITH DOSE HIGHER THAN 81 MG  Other reaction(s): GI Upset (intolerance), Other (See Comments) REACTION: STOMACH ISSUES WITH DOSE HIGHER THAN 81 MG  REACTION: STOMACH ISSUES WITH DOSE HIGHER THAN 81 MG   . Hydrocodone-Acetaminophen Nausea And Vomiting  . Morphine Nausea And Vomiting   . Penicillins Rash    Patient took injection and tablets, had a reaction. She has taken amoxicillin with no reaction Has patient had a PCN reaction causing immediate rash, facial/tongue/throat swelling, SOB or lightheadedness with hypotension: Yes Has patient had a PCN reaction causing severe rash involving mucus membranes or skin necrosis: Yes Has patient had a PCN reaction that required hospitalization No Has patient had a PCN reaction occurring within the last 10 years: No If all of the above answers are "NO", then m Patient took injection and tablets, had a reaction. She has taken amoxicillin with no reaction Patient took injection and tablets, had a reaction. She has taken amoxicillin with no reaction Has patient had a PCN reaction causing immediate rash, facial/tongue/throat swelling, SOB or lightheadedness with hypotension: Yes Has patient had a PCN reaction causing severe rash involving mucus membranes or skin necrosis: Yes Has patient had a PCN reaction that required hospitalization No Has patient had a PCN reaction occurring within the last 10 years: No If all of the above answers are "NO", then m  . Betaine Nausea And Vomiting  . Dacarbazine Nausea And Vomiting  . Ketorolac Tromethamine Itching  . Brimonidine Itching  . Diltiazem Hcl Rash  . Hydrocodone-Acetaminophen Nausea And Vomiting  . Neurontin [Gabapentin] Nausea And Vomiting  . Sulfacetamide Sodium Rash    Social History   Socioeconomic History  . Marital status: Married    Spouse name: Not on file  . Number of children: 5  . Years of education: Not on file  . Highest education level: Not on file  Occupational History  . Occupation: retired  Tobacco Use  . Smoking status: Never Smoker  . Smokeless tobacco: Never Used  Vaping Use  . Vaping Use: Never used  Substance and Sexual Activity  . Alcohol use: No  . Drug use: No  . Sexual activity: Not on file  Other Topics Concern  . Not on file  Social History  Narrative   Retired Marine scientist   Social Determinants of Radio broadcast assistant Strain: Not on file  Food Insecurity: Not on file  Transportation Needs: Not on file  Physical Activity: Not on file  Stress: Not on file  Social Connections: Not on file  Intimate Partner Violence: Not on file     ROS- All systems  are reviewed and negative except as per the HPI above.  Physical Exam: Vitals:   11/08/20 1107  BP: 112/60  Pulse: 76  Weight: 50 kg  Height: _0  (1.651 m)    GEN- The patient is a cachectic elderly female, alert and oriented x 3 today.   HEENT-head normocephalic, atraumatic, sclera clear, conjunctiva pink, hearing intact, trachea midline. Lungs- Clear to ausculation bilaterally, normal work of breathing Heart- irregular rate and rhythm, no murmurs, rubs or gallops  GI- soft, NT, ND, + BS Extremities- no clubbing, cyanosis, or edema MS- no significant deformity or atrophy Skin- no rash or lesion Psych- euthymic mood, full affect Neuro- strength and sensation are intact   Wt Readings from Last 3 Encounters:  11/08/20 50 kg  10/27/20 51.5 kg  10/25/20 51.5 kg    EKG today demonstrates V pacing with underlying afib Vent. rate 76 BPM QRS duration 146 ms QT/QTc 446/501 ms  Echo 04/02/20 demonstrated  1. There is significant apical-basal and septal-lateral pacing-induced  dyssynchrony. Left ventricular ejection fraction, by estimation, is 40 to  45%. The left ventricle has mildly decreased function. The left ventricle  demonstrates regional wall motion  abnormalities (see scoring diagram/findings for description). There is  moderate concentric left ventricular hypertrophy. Left ventricular  diastolic parameters are consistent with Grade II diastolic dysfunction  (pseudonormalization). Elevated left atrial  pressure. There is moderate dyskinesis of the left ventricular, entire  apical segment, although this may be artifactual (pacing related).  2. Right  ventricular systolic function is normal. The right ventricular  size is normal. Mildly increased right ventricular wall thickness. There  is mildly elevated pulmonary artery systolic pressure. The estimated right  ventricular systolic pressure is  35.4 mmHg.  3. Left atrial size was mildly dilated.  4. Right atrial size was mildly dilated.  5. The mitral valve is normal in structure. Mild mitral valve  regurgitation.  6. Tricuspid valve regurgitation is mild to moderate.  7. The aortic valve is tricuspid. Aortic valve regurgitation is trivial.  Very mild aortic valve stenosis.  8. The inferior vena cava is normal in size with greater than 50%  respiratory variability, suggesting right atrial pressure of 3 mmHg.   Comparison(s): Prior images reviewed side by side. The left ventricular  function is worsened. The difference is at least in part (possibly  entirely) due to ventricular pacing (which was present only intermittently  on the 03/01/2019 study, but is seen  constantly throughout the current study).   Epic records are reviewed at length today  CHA2DS2-VASc Score = 5  The patient's score is based upon: CHF History: No HTN History: Yes Diabetes History: Yes Stroke History: No Vascular Disease History: No Age Score: 2 Gender Score: 1      ASSESSMENT AND PLAN: 1. Persistent Atrial Fibrillation/atach/atrial flutter The patient's CHA2DS2-VASc score is 5, indicating a 7.2% annual risk of stroke.   Patient remains in rate controlled afib. We discussed rate vs rhythm control. She is not aware of her afib. Will pursue rate control for now in case invasive GI workup is needed.  Continue Eliquis 2.5 mg BID (age, weight, Cr >1.5).  2. Secondary Hypercoagulable State (ICD10:  D68.69) The patient is at significant risk for stroke/thromboembolism based upon her CHA2DS2-VASc Score of 5.  Continue Apixaban (Eliquis).   3. SSS/2nd degree AV block S/p PPM, followed by Dr Rayann Heman  and the device clinic.  4. Obstructive sleep apnea Patient reports compliance with CPAP therapy.  5. ESRD HD TTS  6. Nausea/vomiting  Patient has seen PCP and GI, delayed gastric emptying. Encouraged f/u with GI.   Follow up in the AF clinic in one month.    [facility] [address] [place], [place] [address] [phone] 11/08/2020 11:41 AM

## 2020-11-17 NOTE — Progress Notes (Signed)
Remote pacemaker transmission.   

## 2020-11-25 ENCOUNTER — Telehealth: Payer: Self-pay

## 2020-11-25 NOTE — Telephone Encounter (Signed)
Son called and left a message to call him.  Called back. He and his Mom tested positive for covid. She tested at the dialysis center one day last week, he is not sure of the day. They did a home test for covid on 2/3 and she was positive. Offered to send referral for monoclonal infusion. Son declined referral and said she is doing fine. Moved appts to 2/24. He is aware of appt time/date.

## 2020-11-29 ENCOUNTER — Encounter: Payer: Self-pay | Admitting: Podiatry

## 2020-11-29 ENCOUNTER — Inpatient Hospital Stay: Payer: Medicare PPO | Admitting: Hematology and Oncology

## 2020-11-29 ENCOUNTER — Inpatient Hospital Stay: Payer: Medicare PPO

## 2020-12-08 ENCOUNTER — Other Ambulatory Visit: Payer: Self-pay | Admitting: Hematology and Oncology

## 2020-12-08 ENCOUNTER — Other Ambulatory Visit: Payer: Self-pay

## 2020-12-08 ENCOUNTER — Encounter: Payer: Self-pay | Admitting: Podiatry

## 2020-12-08 ENCOUNTER — Telehealth: Payer: Self-pay

## 2020-12-08 ENCOUNTER — Ambulatory Visit (INDEPENDENT_AMBULATORY_CARE_PROVIDER_SITE_OTHER): Payer: Medicare PPO | Admitting: Podiatry

## 2020-12-08 DIAGNOSIS — N186 End stage renal disease: Secondary | ICD-10-CM | POA: Diagnosis not present

## 2020-12-08 DIAGNOSIS — E1159 Type 2 diabetes mellitus with other circulatory complications: Secondary | ICD-10-CM

## 2020-12-08 DIAGNOSIS — B351 Tinea unguium: Secondary | ICD-10-CM

## 2020-12-08 DIAGNOSIS — D689 Coagulation defect, unspecified: Secondary | ICD-10-CM

## 2020-12-08 DIAGNOSIS — Z992 Dependence on renal dialysis: Secondary | ICD-10-CM

## 2020-12-08 DIAGNOSIS — T451X5A Adverse effect of antineoplastic and immunosuppressive drugs, initial encounter: Secondary | ICD-10-CM

## 2020-12-08 DIAGNOSIS — M79674 Pain in right toe(s): Secondary | ICD-10-CM

## 2020-12-08 DIAGNOSIS — G620 Drug-induced polyneuropathy: Secondary | ICD-10-CM | POA: Diagnosis not present

## 2020-12-08 DIAGNOSIS — M79675 Pain in left toe(s): Secondary | ICD-10-CM

## 2020-12-08 MED ORDER — OXYCODONE HCL 5 MG PO TABS: 5.0000 mg | ORAL_TABLET | ORAL | 0 refills | Status: DC | PRN

## 2020-12-08 NOTE — Progress Notes (Signed)
This patient returns to my office for at risk foot care.  This patient requires this care by a professional since this patient will be at risk due to having peripheral neuropathy, ESRD and coagulation defect due to eliquis.  Patient is also diabetic.  This patient is unable to cut nails herself since the patient cannot reach her nails.These nails are painful walking and wearing shoes.  This patient presents for at risk foot care today.  She presents to the office with her son.  General Appearance  Alert, conversant and in no acute stress.  Vascular  Dorsalis pedis and posterior tibial  pulses are weakly  palpable  bilaterally.  Capillary return is within normal limits  bilaterally. Cold feet   Bilaterally.  Digital hair absent.  Neurologic  Senn-Weinstein monofilament wire test within normal limits  bilaterally. Muscle power within normal limits bilaterally.  Nails Thick disfigured discolored nails with subungual debris  from hallux to fifth toes bilaterally. No evidence of bacterial infection or drainage bilaterally.  Orthopedic  No limitations of motion  feet .  No crepitus or effusions noted.  No bony pathology or digital deformities noted. PTTD right foot.  Exostosis midfoot left 3rd MCJ and 1st MCJ  Right.  DJD 1st MPJ  Right foot.  Hallux varus 1st MPJ  Left foot.  Skin  normotropic skin with no porokeratosis noted bilaterally.  No signs of infections or ulcers noted.     Onychomycosis  Pain in right toes  Pain in left toes  Consent was obtained for treatment procedures.   Mechanical debridement of nails 1-5  bilaterally performed with a nail nipper.  Filed with dremel without incident.    Return office visit   4 months                  Told patient to return for periodic foot care and evaluation due to potential at risk complications.   Gardiner Barefoot DPM

## 2020-12-08 NOTE — Telephone Encounter (Signed)
Son called and left a message requesting refill on Oxycodone Rx.

## 2020-12-08 NOTE — Addendum Note (Signed)
Addended by: Gardiner Barefoot on: 12/08/2020 07:01 PM   Modules accepted: Level of Service

## 2020-12-08 NOTE — Telephone Encounter (Signed)
Done

## 2020-12-08 NOTE — Telephone Encounter (Signed)
Called and told son Rx sent. He verbalized understanding.

## 2020-12-09 ENCOUNTER — Ambulatory Visit (HOSPITAL_COMMUNITY): Payer: Medicare PPO | Admitting: Physician Assistant

## 2020-12-10 ENCOUNTER — Encounter: Payer: Self-pay | Admitting: Hematology and Oncology

## 2020-12-10 ENCOUNTER — Inpatient Hospital Stay (HOSPITAL_BASED_OUTPATIENT_CLINIC_OR_DEPARTMENT_OTHER): Payer: Medicare PPO | Admitting: Hematology and Oncology

## 2020-12-10 ENCOUNTER — Other Ambulatory Visit: Payer: Self-pay

## 2020-12-10 ENCOUNTER — Telehealth: Payer: Self-pay | Admitting: Hematology and Oncology

## 2020-12-10 ENCOUNTER — Inpatient Hospital Stay: Payer: Medicare PPO | Attending: Hematology and Oncology

## 2020-12-10 VITALS — BP 123/51 | HR 54 | Temp 97.5°F | Resp 18 | Ht 65.0 in | Wt 109.2 lb

## 2020-12-10 DIAGNOSIS — R634 Abnormal weight loss: Secondary | ICD-10-CM | POA: Diagnosis not present

## 2020-12-10 DIAGNOSIS — R54 Age-related physical debility: Secondary | ICD-10-CM | POA: Insufficient documentation

## 2020-12-10 DIAGNOSIS — Z79899 Other long term (current) drug therapy: Secondary | ICD-10-CM | POA: Diagnosis not present

## 2020-12-10 DIAGNOSIS — K5909 Other constipation: Secondary | ICD-10-CM | POA: Insufficient documentation

## 2020-12-10 DIAGNOSIS — R110 Nausea: Secondary | ICD-10-CM | POA: Insufficient documentation

## 2020-12-10 DIAGNOSIS — C9000 Multiple myeloma not having achieved remission: Secondary | ICD-10-CM | POA: Diagnosis not present

## 2020-12-10 DIAGNOSIS — Z7952 Long term (current) use of systemic steroids: Secondary | ICD-10-CM | POA: Diagnosis not present

## 2020-12-10 DIAGNOSIS — Z7901 Long term (current) use of anticoagulants: Secondary | ICD-10-CM | POA: Diagnosis not present

## 2020-12-10 DIAGNOSIS — R630 Anorexia: Secondary | ICD-10-CM | POA: Diagnosis not present

## 2020-12-10 LAB — COMPREHENSIVE METABOLIC PANEL
ALT: 7 U/L (ref 0–44)
AST: 17 U/L (ref 15–41)
Albumin: 3.3 g/dL — ABNORMAL LOW (ref 3.5–5.0)
Alkaline Phosphatase: 54 U/L (ref 38–126)
Anion gap: 9 (ref 5–15)
BUN: 14 mg/dL (ref 8–23)
CO2: 27 mmol/L (ref 22–32)
Calcium: 8.6 mg/dL — ABNORMAL LOW (ref 8.9–10.3)
Chloride: 99 mmol/L (ref 98–111)
Creatinine, Ser: 2.6 mg/dL — ABNORMAL HIGH (ref 0.44–1.00)
GFR, Estimated: 17 mL/min — ABNORMAL LOW (ref >60)
Glucose, Bld: 113 mg/dL — ABNORMAL HIGH (ref 70–99)
Potassium: 3.5 mmol/L (ref 3.5–5.1)
Sodium: 135 mmol/L (ref 135–145)
Total Bilirubin: 1 mg/dL (ref 0.3–1.2)
Total Protein: 5.3 g/dL — ABNORMAL LOW (ref 6.5–8.1)

## 2020-12-10 LAB — CBC WITH DIFFERENTIAL/PLATELET
Abs Immature Granulocytes: 0.01 10*3/uL (ref 0.00–0.07)
Basophils Absolute: 0 10*3/uL (ref 0.0–0.1)
Basophils Relative: 0 %
Eosinophils Absolute: 0 10*3/uL (ref 0.0–0.5)
Eosinophils Relative: 0 %
HCT: 26.6 % — ABNORMAL LOW (ref 36.0–46.0)
Hemoglobin: 9 g/dL — ABNORMAL LOW (ref 12.0–15.0)
Immature Granulocytes: 0 %
Lymphocytes Relative: 25 %
Lymphs Abs: 1.2 10*3/uL (ref 0.7–4.0)
MCH: 29.7 pg (ref 26.0–34.0)
MCHC: 33.8 g/dL (ref 30.0–36.0)
MCV: 87.8 fL (ref 80.0–100.0)
Monocytes Absolute: 0.4 10*3/uL (ref 0.1–1.0)
Monocytes Relative: 8 %
Neutro Abs: 3.1 10*3/uL (ref 1.7–7.7)
Neutrophils Relative %: 67 %
Platelets: 160 10*3/uL (ref 150–400)
RBC: 3.03 MIL/uL — ABNORMAL LOW (ref 3.87–5.11)
RDW: 17 % — ABNORMAL HIGH (ref 11.5–15.5)
WBC: 4.7 10*3/uL (ref 4.0–10.5)
nRBC: 0 % (ref 0.0–0.2)

## 2020-12-10 MED ORDER — DEXAMETHASONE 2 MG PO TABS: 2.0000 mg | ORAL_TABLET | Freq: Every day | ORAL | 1 refills | Status: DC

## 2020-12-10 NOTE — Assessment & Plan Note (Signed)
We discussed the importance of aggressive laxative therapy 

## 2020-12-10 NOTE — Assessment & Plan Note (Signed)
She has chronic nausea She is taking daily metoclopramide We discussed the importance of aggressive laxative therapy to avoid constipation that could contribute to nausea

## 2020-12-10 NOTE — Assessment & Plan Note (Signed)
Myeloma panel is pending The patient is very frail with progressive weight loss We discussed the importance of supportive care and oral intake I recommend a trial of low-dose dexamethasone I will see her back in a month for further follow-up

## 2020-12-10 NOTE — Assessment & Plan Note (Signed)
She has progressive and intentional weight loss We discussed the risk and benefits of trial of dexamethasone

## 2020-12-10 NOTE — Telephone Encounter (Signed)
Scheduled appts per 2/25 sch msg. Gave pt a print out of AVS 

## 2020-12-10 NOTE — Progress Notes (Signed)
[facility] OFFICE PROGRESS NOTE  Patient Care Team: Lucianne Lei, MD as PCP - General (Family Medicine) Thompson Grayer, MD as PCP - Electrophysiology (Cardiology) Josue Hector, MD as PCP - Cardiology (Cardiology)  ASSESSMENT & PLAN:  Multiple myeloma not having achieved remission [facility]) Myeloma panel is pending The patient is very frail with progressive weight loss We discussed the importance of supportive care and oral intake I recommend a trial of low-dose dexamethasone I will see her back in a month for further follow-up  Nausea in adult She has chronic nausea She is taking daily metoclopramide We discussed the importance of aggressive laxative therapy to avoid constipation that could contribute to nausea  Other constipation We discussed the importance of aggressive laxative therapy  Weight loss, non-intentional She has progressive and intentional weight loss We discussed the risk and benefits of trial of dexamethasone    No orders of the defined types were placed in this encounter.   All questions were answered. The patient knows to call the clinic with any problems, questions or concerns. The total time spent in the appointment was 20 minutes encounter with patients including review of chart and various tests results, discussions about plan of care and coordination of care plan   Heath Lark, MD 12/10/2020 10:28 AM  INTERVAL HISTORY: Please see below for problem oriented charting. She returns with her son for further follow-up She has lost a lot of weight She is not eating well because of altered taste sensation and lack of appetite She also have frequent nausea She does not go to the bathroom to have bowel movement daily However, she denies constipation She is feeling weak all the time No recent infection, fever or chills  SUMMARY OF ONCOLOGIC HISTORY: Oncology History  Multiple myeloma not having achieved remission ([place])  03/03/2019 Initial  Diagnosis   Multiple myeloma not having achieved remission ([place])   03/03/2019 Imaging   1. Round and oval lucent/lytic lesions in the skull, bilateral humeri, distal left femur, and possibly also in the pelvis are compatible with multiple myeloma. No right lower extremity lytic lesion identified. No pathologic fracture. 2. Advanced degenerative changes in the spine with no vertebral myeloma evident by plain radiography. 3. Advanced degenerative changes in the right upper extremity. Bilateral knee arthroplasty. 4.  Aortic Atherosclerosis (ICD10-I70.0).   03/04/2019 -  Chemotherapy   The patient had bortezomib for chemotherapy treatment.    03/04/2019 Bone Marrow Biopsy   Bone Marrow, Aspirate,Biopsy, and Clot - HYPERCELLULAR BONE MARROW FOR AGE WITH PLASMACYTOSIS. - SEE COMMENT. PERIPHERAL BLOOD: - NORMOCYTIC-NORMOCHROMIC ANEMIA. Diagnosis Note The bone marrow is hypercellular for age with increased number of atypical plasma cells averaging 30% of all cells in the aspirate associated with interstitial infiltrates and numerous variably sized aggregates in the clot and biopsy sections. 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 Confirmatory in situ hybridization for kappa and lambda as well as CD138 will be performed and the results reported in an addendum. The background shows trilineage hematopoiesis with mild non-specific changes primarily involving the erythroid series. Correlation with cytogenetic and FISH studies is recommended.    06/20/2019 -  Chemotherapy   The patient had dexamethasone (DECADRON) tablet 20 mg, 20 mg (100 % of original dose 20 mg), Oral,  Once, 19 of 21 cycles Dose modification: 20 mg (original dose 20 mg, Cycle 1), 8 mg (original dose 8 mg, Cycle 10) Administration: 20 mg (06/20/2019), 20 mg (06/27/2019), 20 mg (07/04/2019), 20 mg (07/11/2019), 20  mg (07/18/2019), 20 mg (07/25/2019), 20 mg (08/08/2019), 20 mg (08/15/2019), 20 mg (09/05/2019), 20 mg (12/01/2019), 20  mg (09/26/2019), 20 mg (10/20/2019), 20 mg (11/10/2019), 20 mg (12/22/2019), 20 mg (01/12/2020), 8 mg (02/02/2020), 8 mg (02/23/2020), 8 mg (03/22/2020), 8 mg (04/12/2020), 8 mg (05/03/2020), 8 mg (05/31/2020), 8 mg (06/23/2020), 8 mg (07/14/2020), 8 mg (08/04/2020), 8 mg (08/25/2020) daratumumab-hyaluronidase-fihj (DARZALEX FASPRO) 1800-30000 MG-UT/15ML chemo SQ injection 1,800 mg, 1,800 mg, Subcutaneous,  Once, 19 of 21 cycles Administration: 1,800 mg (06/20/2019), 1,800 mg (06/27/2019), 1,800 mg (07/04/2019), 1,800 mg (07/11/2019), 1,800 mg (07/18/2019), 1,800 mg (07/25/2019), 1,800 mg (08/08/2019), 1,800 mg (08/15/2019), 1,800 mg (09/05/2019), 1,800 mg (12/01/2019), 1,800 mg (02/02/2020), 1,800 mg (09/26/2019), 1,800 mg (10/20/2019), 1,800 mg (11/10/2019), 1,800 mg (12/22/2019), 1,800 mg (01/12/2020), 1,800 mg (02/23/2020), 1,800 mg (03/22/2020), 1,800 mg (04/12/2020), 1,800 mg (05/03/2020), 1,800 mg (05/31/2020), 1,800 mg (06/23/2020), 1,800 mg (07/14/2020), 1,800 mg (08/04/2020), 1,800 mg (08/25/2020)  for chemotherapy treatment.      REVIEW OF SYSTEMS:   Constitutional: Denies fevers, chills  Eyes: Denies blurriness of vision Ears, nose, mouth, throat, and face: Denies mucositis or sore throat Respiratory: Denies cough, dyspnea or wheezes Cardiovascular: Denies palpitation, chest discomfort or lower extremity swelling Skin: Denies abnormal skin rashes Lymphatics: Denies new lymphadenopathy or easy bruising Neurological:Denies numbness, tingling or new weaknesses Behavioral/Psych: Mood is stable, no new changes  All other systems were reviewed with the patient and are negative.  I have reviewed the past medical history, past surgical history, social history and family history with the patient and they are unchanged from previous note.  ALLERGIES:  is allergic to aspirin, hydrocodone-acetaminophen, morphine, penicillins, betaine, dacarbazine, ketorolac tromethamine, brimonidine, diltiazem hcl, hydrocodone-acetaminophen, neurontin  [gabapentin], and sulfacetamide sodium.  MEDICATIONS:  Current Outpatient Medications  Medication Sig Dispense Refill  . dexamethasone (DECADRON) 2 MG tablet Take 1 tablet (2 mg total) by mouth daily. 30 tablet 1  . acetaminophen (TYLENOL) 500 MG tablet Take 1,000 mg by mouth 2 (two) times daily as needed for mild pain or moderate pain.     Marland Kitchen acyclovir (ZOVIRAX) 400 MG tablet Take 1 tablet (400 mg total) by mouth daily. 90 tablet 6  . allopurinol (ZYLOPRIM) 100 MG tablet Take 0.5 tablets (50 mg total) by mouth daily. 30 tablet 0  . apixaban (ELIQUIS) 2.5 MG TABS tablet Take 1 tablet (2.5 mg total) by mouth 2 (two) times daily. 60 tablet 3  . cholecalciferol (VITAMIN D3) 25 MCG (1000 UT) tablet Take 2,000 Units by mouth daily.    . dorzolamide (TRUSOPT) 2 % ophthalmic solution Place 1 drop into the left eye 2 (two) times daily.   1  . fluorometholone (FML) 0.1 % ophthalmic suspension Place 1 drop into the right eye daily.     Marland Kitchen glimepiride (AMARYL) 1 MG tablet Take 0.5 mg by mouth daily.     Marland Kitchen lidocaine-prilocaine (EMLA) cream Apply 1 application topically as needed [facility]).     Marland Kitchen LUMIGAN 0.01 % SOLN Place 1 drop into the left eye at bedtime.     Marland Kitchen LYRICA 50 MG capsule Take 50 mg by mouth at bedtime.    . Methoxy PEG-Epoetin Beta (MIRCERA IJ) Mircera    . metoCLOPramide (REGLAN) 5 MG tablet Take 1 tablet (5 mg total) by mouth 3 (three) times daily before meals. 90 tablet 1  . midodrine (PROAMATINE) 5 MG tablet Take 5 mg by mouth as directed. Low bp, and before dialysis    . ondansetron (ZOFRAN) 4 MG tablet Take 1  tablet (4 mg total) by mouth every 8 (eight) hours as needed for nausea. 60 tablet 1  . oxyCODONE (OXY IR/ROXICODONE) 5 MG immediate release tablet Take 1 tablet (5 mg total) by mouth every 4 (four) hours as needed for severe pain. 60 tablet 0  . pantoprazole (PROTONIX) 40 MG tablet Take 1 tablet (40 mg total) by mouth daily as needed (acid reflux/indigestion.). 30 tablet 11  .  polyethylene glycol (MIRALAX / GLYCOLAX) 17 g packet Take 17 g by mouth daily.    Marland Kitchen senna-docusate (SENOKOT-S) 8.6-50 MG tablet Take 2 tablets by mouth 2 (two) times daily. 30 tablet 0  . sevelamer carbonate (RENVELA) 800 MG tablet Take 1,600 mg by mouth 3 (three) times daily with meals.    . sodium chloride (OCEAN) 0.65 % SOLN nasal spray Place 1 spray into both nostrils 4 (four) times daily as needed for congestion.     Current Facility-Administered Medications  Medication Dose Route Frequency Provider Last Rate Last Admin  . 0.9 %  sodium chloride infusion  250 mL Intravenous PRN Waynetta Sandy, MD        PHYSICAL EXAMINATION: ECOG PERFORMANCE STATUS: 1 - Symptomatic but completely ambulatory  Vitals:   12/10/20 0859  BP: (!) 123/51  Pulse: (!) 54  Resp: 18  Temp: (!) 97.5 F (36.4 C)  SpO2: 100%   Filed Weights   12/10/20 0859  Weight: 109 lb 3.2 oz (49.5 kg)    GENERAL:alert, no distress and comfortable.  She looks thin and cachectic NEURO: alert & oriented x 3 with fluent speech, no focal motor/sensory deficits  LABORATORY DATA:  I have reviewed the data as listed    Component Value Date/Time   NA 135 12/10/2020 0839   K 3.5 12/10/2020 0839   CL 99 12/10/2020 0839   CO2 27 12/10/2020 0839   GLUCOSE 113 (H) 12/10/2020 0839   BUN 14 12/10/2020 0839   CREATININE 2.60 (H) 12/10/2020 0839   CREATININE 2.71 (H) 10/20/2019 1128   CREATININE 1.03 (H) 05/11/2016 0931   CALCIUM 8.6 (L) 12/10/2020 0839   PROT 5.3 (L) 12/10/2020 0839   ALBUMIN 3.3 (L) 12/10/2020 0839   AST 17 12/10/2020 0839   AST 18 10/20/2019 1128   ALT 7 12/10/2020 0839   ALT 10 10/20/2019 1128   ALKPHOS 54 12/10/2020 0839   BILITOT 1.0 12/10/2020 0839   BILITOT 0.4 10/20/2019 1128   GFRNONAA 17 (L) 12/10/2020 0839   GFRNONAA 15 (L) 10/20/2019 1128   GFRAA 14 (L) 07/14/2020 1021   GFRAA 18 (L) 10/20/2019 1128    No results found for: SPEP, UPEP  Lab Results  Component Value Date    WBC 4.7 12/10/2020   NEUTROABS 3.1 12/10/2020   HGB 9.0 (L) 12/10/2020   HCT 26.6 (L) 12/10/2020   MCV 87.8 12/10/2020   PLT 160 12/10/2020      Chemistry      Component Value Date/Time   NA 135 12/10/2020 0839   K 3.5 12/10/2020 0839   CL 99 12/10/2020 0839   CO2 27 12/10/2020 0839   BUN 14 12/10/2020 0839   CREATININE 2.60 (H) 12/10/2020 0839   CREATININE 2.71 (H) 10/20/2019 1128   CREATININE 1.03 (H) 05/11/2016 0931      Component Value Date/Time   CALCIUM 8.6 (L) 12/10/2020 0839   ALKPHOS 54 12/10/2020 0839   AST 17 12/10/2020 0839   AST 18 10/20/2019 1128   ALT 7 12/10/2020 0839   ALT 10 10/20/2019 1128  BILITOT 1.0 12/10/2020 0839   BILITOT 0.4 10/20/2019 1128

## 2020-12-13 ENCOUNTER — Telehealth: Payer: Self-pay

## 2020-12-13 ENCOUNTER — Encounter (HOSPITAL_COMMUNITY): Payer: Self-pay | Admitting: Physician Assistant

## 2020-12-13 ENCOUNTER — Other Ambulatory Visit: Payer: Self-pay

## 2020-12-13 ENCOUNTER — Ambulatory Visit (HOSPITAL_COMMUNITY)
Admission: RE | Admit: 2020-12-13 | Discharge: 2020-12-13 | Disposition: A | Discharge status: Home / Self Care | Payer: Medicare PPO | Source: Ambulatory Visit | Attending: Physician Assistant | Admitting: Physician Assistant

## 2020-12-13 VITALS — BP 118/72 | HR 77 | Ht 65.0 in | Wt 106.0 lb

## 2020-12-13 DIAGNOSIS — Z950 Presence of cardiac pacemaker: Secondary | ICD-10-CM | POA: Insufficient documentation

## 2020-12-13 DIAGNOSIS — Z992 Dependence on renal dialysis: Secondary | ICD-10-CM | POA: Diagnosis not present

## 2020-12-13 DIAGNOSIS — I495 Sick sinus syndrome: Secondary | ICD-10-CM | POA: Insufficient documentation

## 2020-12-13 DIAGNOSIS — Z888 Allergy status to other drugs, medicaments and biological substances status: Secondary | ICD-10-CM | POA: Diagnosis not present

## 2020-12-13 DIAGNOSIS — E1122 Type 2 diabetes mellitus with diabetic chronic kidney disease: Secondary | ICD-10-CM | POA: Insufficient documentation

## 2020-12-13 DIAGNOSIS — I441 Atrioventricular block, second degree: Secondary | ICD-10-CM | POA: Diagnosis not present

## 2020-12-13 DIAGNOSIS — Z880 Allergy status to penicillin: Secondary | ICD-10-CM | POA: Diagnosis not present

## 2020-12-13 DIAGNOSIS — C9001 Multiple myeloma in remission: Secondary | ICD-10-CM | POA: Diagnosis not present

## 2020-12-13 DIAGNOSIS — Z79899 Other long term (current) drug therapy: Secondary | ICD-10-CM | POA: Insufficient documentation

## 2020-12-13 DIAGNOSIS — G4733 Obstructive sleep apnea (adult) (pediatric): Secondary | ICD-10-CM | POA: Diagnosis not present

## 2020-12-13 DIAGNOSIS — Z7901 Long term (current) use of anticoagulants: Secondary | ICD-10-CM | POA: Insufficient documentation

## 2020-12-13 DIAGNOSIS — N186 End stage renal disease: Secondary | ICD-10-CM | POA: Insufficient documentation

## 2020-12-13 DIAGNOSIS — I120 Hypertensive chronic kidney disease with stage 5 chronic kidney disease or end stage renal disease: Secondary | ICD-10-CM | POA: Diagnosis not present

## 2020-12-13 DIAGNOSIS — Z8249 Family history of ischemic heart disease and other diseases of the circulatory system: Secondary | ICD-10-CM | POA: Diagnosis not present

## 2020-12-13 DIAGNOSIS — I4819 Other persistent atrial fibrillation: Secondary | ICD-10-CM | POA: Diagnosis not present

## 2020-12-13 DIAGNOSIS — D6869 Other thrombophilia: Secondary | ICD-10-CM | POA: Diagnosis not present

## 2020-12-13 DIAGNOSIS — Z885 Allergy status to narcotic agent status: Secondary | ICD-10-CM | POA: Insufficient documentation

## 2020-12-13 DIAGNOSIS — R112 Nausea with vomiting, unspecified: Secondary | ICD-10-CM | POA: Insufficient documentation

## 2020-12-13 DIAGNOSIS — Z7984 Long term (current) use of oral hypoglycemic drugs: Secondary | ICD-10-CM | POA: Insufficient documentation

## 2020-12-13 DIAGNOSIS — Z886 Allergy status to analgesic agent status: Secondary | ICD-10-CM | POA: Insufficient documentation

## 2020-12-13 DIAGNOSIS — I484 Atypical atrial flutter: Secondary | ICD-10-CM

## 2020-12-13 LAB — MULTIPLE MYELOMA PANEL, SERUM
Albumin SerPl Elph-Mcnc: 3.3 g/dL (ref 2.9–4.4)
Albumin/Glob SerPl: 2.1 — ABNORMAL HIGH (ref 0.7–1.7)
Alpha 1: 0.2 g/dL (ref 0.0–0.4)
Alpha2 Glob SerPl Elph-Mcnc: 0.4 g/dL (ref 0.4–1.0)
B-Globulin SerPl Elph-Mcnc: 0.8 g/dL (ref 0.7–1.3)
Gamma Glob SerPl Elph-Mcnc: 0.2 g/dL — ABNORMAL LOW (ref 0.4–1.8)
Globulin, Total: 1.6 g/dL — ABNORMAL LOW (ref 2.2–3.9)
IgA: 27 mg/dL — ABNORMAL LOW (ref 64–422)
IgG (Immunoglobin G), Serum: 660 mg/dL (ref 586–1602)
IgM (Immunoglobulin M), Srm: 10 mg/dL — ABNORMAL LOW (ref 26–217)
M Protein SerPl Elph-Mcnc: 0.2 g/dL — ABNORMAL HIGH
Total Protein ELP: 4.9 g/dL — ABNORMAL LOW (ref 6.0–8.5)

## 2020-12-13 LAB — KAPPA/LAMBDA LIGHT CHAINS
Kappa free light chain: 26.3 mg/L — ABNORMAL HIGH (ref 3.3–19.4)
Kappa, lambda light chain ratio: 0.99 (ref 0.26–1.65)
Lambda free light chains: 26.5 mg/L — ABNORMAL HIGH (ref 5.7–26.3)

## 2020-12-13 NOTE — Telephone Encounter (Signed)
Called and given below message to son. He verbalized understanding. She is eating multiple small meals and ate better yesterday. She saw the cardiologist today. Her weight was down to 106 lbs from office visit at [facility]. He thinks it is the difference in the scale.

## 2020-12-13 NOTE — Progress Notes (Signed)
Primary Care Physician: Lucianne Lei, MD Primary Cardiologist: Dr Johnsie Cancel Primary Electrophysiologist: Dr Rayann Heman Referring Physician: Device clinic   [name] is a 87 y.o. female with a history of SSS s/p PPM, DM, HTN, ESRD, multiple myeloma, OSA, atrial tachycardia, and atrial fibrillation who presents for follow up in the [facility]. The patient was initially diagnosed with atrial fibrillation 09/2020 on a remote device check. The longest episode was 1 day and 17 hours. She was not aware of her arrhythmia although she does have intermittent SOB. Patient has a CHADS2VASC score of 5. There were no specific triggers that she could identify.   On follow up today, patient reports that she is unaware of her arrhythmia. She denies any bleeding issues on anticoagulation. She continues to struggle with nausea and has lost more weight.   Today, she denies symptoms of palpitations, chest pain, orthopnea, PND, lower extremity edema, dizziness, presyncope, syncope, bleeding, or neurologic sequela. The patient is tolerating medications without difficulties and is otherwise without complaint today.    Atrial Fibrillation Risk Factors:  she does have symptoms or diagnosis of sleep apnea. she is compliant with CPAP therapy. she does not have a history of rheumatic fever.   she has a BMI of Body mass index is 17.64 kg/m.Marland Kitchen Filed Weights   12/13/20 1340  Weight: 48.1 kg    Family History  Problem Relation Age of Onset  . Congestive Heart Failure Mother   . Tuberculosis Mother   . Multiple myeloma Mother   . Bone cancer Father   . Arthritis Father   . Breast cancer Sister   . Colon cancer Sister   . Hypertension Sister   . Dementia Sister   . Alcohol abuse Son      Atrial Fibrillation Management history:  Previous antiarrhythmic drugs: none Previous cardioversions: none Previous ablations: none CHADS2VASC score: 5 Anticoagulation history:  Eliquis   Past Medical History:  Diagnosis Date  . Anemia   . Arthritis   . Benign paroxysmal positional vertigo 04/06/2010  . Benign positional vertigo   . 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 CAROTID BRUIT 02/24/2008  . Complication of anesthesia    took a long time to wake up with knee replacement  . DIABETES MELLITUS, TYPE II 02/24/2008  . ESRD (end stage renal disease) on dialysis ([place])    tues thurs [facility] sees [place] [facility]  . GERD (gastroesophageal reflux disease)   . Glaucoma   . Gout   . HYPERTENSION 02/24/2008  . Multiple myeloma ([place])   . Obstructive sleep apnea   . Pancytopenia ([place])   . Sick sinus syndrome ([place])   . Wears dentures   . Wears glasses    Past Surgical History:  Procedure Laterality Date  . A/V FISTULAGRAM Right 03/08/2020   Procedure: A/V FISTULAGRAM- Right Arm;  Surgeon: Waynetta Sandy, MD;  Location: [place] [facility];  Service: Cardiovascular;  Laterality: Right;  . A/V FISTULAGRAM N/A 06/28/2020   Procedure: A/V FISTULAGRAM - Right Upper Arm;  Surgeon: Waynetta Sandy, MD;  Location: [place] [facility];  Service: Cardiovascular;  Laterality: N/A;  . ABDOMINAL HYSTERECTOMY     1980's  . AV FISTULA PLACEMENT Right 03/11/2019   Procedure: CREATION RIGHT ARM RADIOCEPHALIC ARTERIOVENOUS FISTULA;  Surgeon: Angelia Mould, MD;  Location: [facility] OR;  Service: Vascular;  Laterality: Right;  . AV FISTULA PLACEMENT Right 03/25/2020   Procedure: right arm ARTERIOVENOUS (AV) FISTULA  CREATION;  Surgeon: Waynetta Sandy, MD;  Location: [place];  Service: Vascular;  Laterality: Right;  . BACK SURGERY     x 3  . BIOPSY  06/24/2019   Procedure: BIOPSY;  Surgeon: Ronald Lobo, MD;  Location: [facility];  Service: Endoscopy;;  . EP IMPLANTABLE DEVICE N/A 05/16/2016   Procedure: PPM Generator Changeout;  Surgeon: Thompson Grayer, MD;  Location: [place] [facility];  Service: Cardiovascular;  Laterality: N/A;  .  ESOPHAGOGASTRODUODENOSCOPY (EGD) WITH PROPOFOL N/A 06/24/2019   Procedure: ESOPHAGOGASTRODUODENOSCOPY (EGD) WITH PROPOFOL;  Surgeon: Ronald Lobo, MD;  Location: [facility];  Service: Endoscopy;  Laterality: N/A;  . FISTULA SUPERFICIALIZATION Right 03/11/2019   Procedure: FISTULA SUPERFICIALIZATION;  Surgeon: Angelia Mould, MD;  Location: [place];  Service: Vascular;  Laterality: Right;  . INSERTION OF DIALYSIS CATHETER Right 03/25/2020   Procedure: INSERTION OF DIALYSIS CATHETER, right internal jugular;  Surgeon: Waynetta Sandy, MD;  Location: [place];  Service: Vascular;  Laterality: Right;  . IR FLUORO GUIDE CV LINE RIGHT  03/07/2019  . IR US GUIDE VASC ACCESS RIGHT  03/07/2019  . KNEE SURGERY Right    2003  . KNEE SURGERY Left    2011  . PACEMAKER INSERTION    . PERIPHERAL VASCULAR BALLOON ANGIOPLASTY Right 06/28/2020   Procedure: PERIPHERAL VASCULAR BALLOON ANGIOPLASTY;  Surgeon: Waynetta Sandy, MD;  Location: [place] [facility];  Service: Cardiovascular;  Laterality: Right;    Current Outpatient Medications  Medication Sig Dispense Refill  . acetaminophen (TYLENOL) 500 MG tablet Take 1,000 mg by mouth 2 (two) times daily as needed for mild pain or moderate pain.     Marland Kitchen acyclovir (ZOVIRAX) 400 MG tablet Take 1 tablet (400 mg total) by mouth daily. 90 tablet 6  . allopurinol (ZYLOPRIM) 100 MG tablet Take 0.5 tablets (50 mg total) by mouth daily. 30 tablet 0  . apixaban (ELIQUIS) 2.5 MG TABS tablet Take 1 tablet (2.5 mg total) by mouth 2 (two) times daily. 60 tablet 3  . cholecalciferol (VITAMIN D3) 25 MCG (1000 UT) tablet Take 2,000 Units by mouth daily.    Marland Kitchen dexamethasone (DECADRON) 2 MG tablet Take 1 tablet (2 mg total) by mouth daily. 30 tablet 1  . dorzolamide (TRUSOPT) 2 % ophthalmic solution Place 1 drop into the left eye 2 (two) times daily.   1  . fluorometholone (FML) 0.1 % ophthalmic suspension Place 1 drop into the right eye daily.     Marland Kitchen glimepiride  (AMARYL) 1 MG tablet Take 0.5 mg by mouth daily.     Marland Kitchen lidocaine-prilocaine (EMLA) cream Apply 1 application topically as needed [facility]).     Marland Kitchen LUMIGAN 0.01 % SOLN Place 1 drop into the left eye at bedtime.     Marland Kitchen LYRICA 50 MG capsule Take 50 mg by mouth at bedtime.    . Methoxy PEG-Epoetin Beta (MIRCERA IJ) Mircera    . metoCLOPramide (REGLAN) 5 MG tablet Take 1 tablet (5 mg total) by mouth 3 (three) times daily before meals. 90 tablet 1  . midodrine (PROAMATINE) 5 MG tablet Take 5 mg by mouth as directed. Low bp, and before dialysis    . ondansetron (ZOFRAN) 4 MG tablet Take 1 tablet (4 mg total) by mouth every 8 (eight) hours as needed for nausea. 60 tablet 1  . oxyCODONE (OXY IR/ROXICODONE) 5 MG immediate release tablet Take 1 tablet (5 mg total) by mouth every 4 (four) hours as needed for severe pain. 60 tablet 0  .  pantoprazole (PROTONIX) 40 MG tablet Take 1 tablet (40 mg total) by mouth daily as needed (acid reflux/indigestion.). 30 tablet 11  . polyethylene glycol (MIRALAX / GLYCOLAX) 17 g packet Take 17 g by mouth daily.    Marland Kitchen senna-docusate (SENOKOT-S) 8.6-50 MG tablet Take 2 tablets by mouth 2 (two) times daily. 30 tablet 0  . sevelamer carbonate (RENVELA) 800 MG tablet Take 1,600 mg by mouth 3 (three) times daily with meals.    . sodium chloride (OCEAN) 0.65 % SOLN nasal spray Place 1 spray into both nostrils 4 (four) times daily as needed for congestion.     Current Facility-Administered Medications  Medication Dose Route Frequency Provider Last Rate Last Admin  . 0.9 %  sodium chloride infusion  250 mL Intravenous PRN Waynetta Sandy, MD        Allergies  Allergen Reactions  . Aspirin Other (See Comments)    REACTION: STOMACH ISSUES WITH DOSE HIGHER THAN 81 MG  Other reaction(s): GI Upset (intolerance), Other (See Comments) REACTION: STOMACH ISSUES WITH DOSE HIGHER THAN 81 MG  REACTION: STOMACH ISSUES WITH DOSE HIGHER THAN 81 MG   . Hydrocodone-Acetaminophen Nausea And  Vomiting  . Morphine Nausea And Vomiting  . Penicillins Rash    Patient took injection and tablets, had a reaction. She has taken amoxicillin with no reaction Has patient had a PCN reaction causing immediate rash, facial/tongue/throat swelling, SOB or lightheadedness with hypotension: Yes Has patient had a PCN reaction causing severe rash involving mucus membranes or skin necrosis: Yes Has patient had a PCN reaction that required hospitalization No Has patient had a PCN reaction occurring within the last 10 years: No If all of the above answers are "NO", then m Patient took injection and tablets, had a reaction. She has taken amoxicillin with no reaction Patient took injection and tablets, had a reaction. She has taken amoxicillin with no reaction Has patient had a PCN reaction causing immediate rash, facial/tongue/throat swelling, SOB or lightheadedness with hypotension: Yes Has patient had a PCN reaction causing severe rash involving mucus membranes or skin necrosis: Yes Has patient had a PCN reaction that required hospitalization No Has patient had a PCN reaction occurring within the last 10 years: No If all of the above answers are "NO", then m  . Betaine Nausea And Vomiting  . Dacarbazine Nausea And Vomiting  . Ketorolac Tromethamine Itching  . Brimonidine Itching  . Diltiazem Hcl Rash  . Hydrocodone-Acetaminophen Nausea And Vomiting  . Neurontin [Gabapentin] Nausea And Vomiting  . Sulfacetamide Sodium Rash    Social History   Socioeconomic History  . Marital status: Married    Spouse name: Not on file  . Number of children: 5  . Years of education: Not on file  . Highest education level: Not on file  Occupational History  . Occupation: retired  Tobacco Use  . Smoking status: Never Smoker  . Smokeless tobacco: Never Used  Vaping Use  . Vaping Use: Never used  Substance and Sexual Activity  . Alcohol use: No  . Drug use: No  . Sexual activity: Not on file  Other  Topics Concern  . Not on file  Social History Narrative   Retired Marine scientist   Social Determinants of Radio broadcast assistant Strain: Not on file  Food Insecurity: Not on file  Transportation Needs: Not on file  Physical Activity: Not on file  Stress: Not on file  Social Connections: Not on file  Intimate Partner Violence: Not on  file     ROS- All systems are reviewed and negative except as per the HPI above.  Physical Exam: Vitals:   12/13/20 1340  BP: 118/72  Pulse: 77  Weight: 48.1 kg  Height: 5' 5"  (1.651 m)    GEN- The patient is a frail appearing elderly female, alert and oriented x 3 today.   HEENT-head normocephalic, atraumatic, sclera clear, conjunctiva pink, hearing intact, trachea midline. Lungs- Clear to ausculation bilaterally, normal work of breathing Heart- irregular rate and rhythm, no murmurs, rubs or gallops  GI- soft, NT, ND, + BS Extremities- no clubbing, cyanosis, or edema MS- no significant deformity or atrophy Skin- no rash or lesion Psych- euthymic mood, full affect Neuro- strength and sensation are intact   Wt Readings from Last 3 Encounters:  12/13/20 48.1 kg  12/10/20 49.5 kg  11/08/20 50 kg    EKG today demonstrates Atypical atrial flutter with intermittent V pacing Vent. rate 77 BPM QRS duration 144 ms QT/QTc 430/486 ms  Echo 04/02/20 demonstrated  1. There is significant apical-basal and septal-lateral pacing-induced  dyssynchrony. Left ventricular ejection fraction, by estimation, is 40 to  45%. The left ventricle has mildly decreased function. The left ventricle  demonstrates regional wall motion  abnormalities (see scoring diagram/findings for description). There is  moderate concentric left ventricular hypertrophy. Left ventricular  diastolic parameters are consistent with Grade II diastolic dysfunction  (pseudonormalization). Elevated left atrial  pressure. There is moderate dyskinesis of the left ventricular, entire   apical segment, although this may be artifactual (pacing related).  2. Right ventricular systolic function is normal. The right ventricular  size is normal. Mildly increased right ventricular wall thickness. There  is mildly elevated pulmonary artery systolic pressure. The estimated right  ventricular systolic pressure is  02.5 mmHg.  3. Left atrial size was mildly dilated.  4. Right atrial size was mildly dilated.  5. The mitral valve is normal in structure. Mild mitral valve  regurgitation.  6. Tricuspid valve regurgitation is mild to moderate.  7. The aortic valve is tricuspid. Aortic valve regurgitation is trivial.  Very mild aortic valve stenosis.  8. The inferior vena cava is normal in size with greater than 50%  respiratory variability, suggesting right atrial pressure of 3 mmHg.   Comparison(s): Prior images reviewed side by side. The left ventricular  function is worsened. The difference is at least in part (possibly  entirely) due to ventricular pacing (which was present only intermittently  on the 03/01/2019 study, but is seen  constantly throughout the current study).   Epic records are reviewed at length today  CHA2DS2-VASc Score = 5  The patient's score is based upon: CHF History: No HTN History: Yes Diabetes History: Yes Stroke History: No Vascular Disease History: No Age Score: 2 Gender Score: 1      ASSESSMENT AND PLAN: 1. Persistent Atrial Fibrillation/atach/atrial flutter The patient's CHA2DS2-VASc score is 5, indicating a 7.2% annual risk of stroke.   Patient remains rate controlled. Given her age and comorbidities will continue with a conservative strategy for now.  Continue Eliquis 2.5 mg BID (age, weight, Cr >1.5).  2. Secondary Hypercoagulable State (ICD10:  D68.69) The patient is at significant risk for stroke/thromboembolism based upon her CHA2DS2-VASc Score of 5.  Continue Apixaban (Eliquis).   3. SSS/2nd degree AV block S/p PPM,  followed by Dr Rayann Heman and the device clinic.  4. Obstructive sleep apnea Patient reports compliance with CPAP therapy.  5. ESRD HD TTS  6. Nausea/vomiting  Chronic for her. Plans per PCP, GI, and oncology.    Follow up with Oda Kilts per recall.   [facility] [address] [place], [place] [address] [phone] 12/13/2020 1:47 PM

## 2020-12-13 NOTE — Telephone Encounter (Signed)
-----   Message from Heath Lark, MD sent at 12/13/2020  2:42 PM EST ----- Regarding: call her son Pls let him know her myeloma panel is stable Is she eating better?

## 2020-12-15 ENCOUNTER — Telehealth: Payer: Self-pay

## 2020-12-15 NOTE — Telephone Encounter (Signed)
No suggestions, will call her next week

## 2020-12-15 NOTE — Telephone Encounter (Signed)
Son called and left a message to call.  Called back. His Mom had a large loose BM yesterday after not having a bm in 2 days. She complained of intense abdominal cramping and wants to stop taking so many laxatives. She is currently taking Senokot 2 tabs bid, Miralax daily and Lactulose TID. She wants to back off on the laxatives due to the cramps yesterday. Any suggestions?

## 2020-12-15 NOTE — Telephone Encounter (Signed)
Called and given below message to son. He verbalzied understanding.

## 2020-12-20 ENCOUNTER — Telehealth: Payer: Self-pay

## 2020-12-20 ENCOUNTER — Other Ambulatory Visit: Payer: Self-pay | Admitting: Hematology and Oncology

## 2020-12-20 MED ORDER — LYRICA 50 MG PO CAPS
50.0000 mg | ORAL_CAPSULE | Freq: Every day | ORAL | 3 refills | Status: DC
Start: 2020-12-20 — End: 2021-09-05

## 2020-12-20 NOTE — Telephone Encounter (Signed)
-----   Message from Heath Lark, MD sent at 12/20/2020  8:02 AM EST ----- Can you call her son and ask how she is doing?

## 2020-12-20 NOTE — Telephone Encounter (Signed)
done

## 2020-12-20 NOTE — Telephone Encounter (Signed)
Called and given below message. Son verbalized understanding. [name] is eating better and taking laxatives. She is having bm daily. Asking if you call fill Lyrica 50 mg that she takes at bedtime? She usually gets Rx from Dr. Criss Rosales, per son they are hard to get in touch with.

## 2020-12-20 NOTE — Telephone Encounter (Signed)
When I tried to renew the order, the electronic system suggested [place] mail delivery Is that what he wants or 30 days supply locally

## 2020-12-20 NOTE — Telephone Encounter (Signed)
Per son, please send to [facility] mail delivery since that is a option.

## 2020-12-22 ENCOUNTER — Telehealth (HOSPITAL_COMMUNITY): Payer: Self-pay | Admitting: *Deleted

## 2020-12-22 NOTE — Telephone Encounter (Signed)
Transmission reviewed, patient has been in AF with VP rhythm since 11/27/20. Charlcie Cradle Pa given report on patient transmission and s/sx . Will forward to AF Clinic to schedule appointment with [facility]. Patient has dialysis @ 1030 on T, Thursday, Saturday. Stressed importance to son of attending dialysis and insuring no missed doses of Eliquis. Will expect call from AF Clinic for appointment. Ed precautions given for worsening SOB or chest pressure or CP.

## 2020-12-22 NOTE — Telephone Encounter (Signed)
Patient son called in stating for the last 1-2 days patient has been having increased shortness of breath, orthopnea denies weight gain or missed dialysis treatments. Discussed with Adline Peals PA will have pt send transmission to ensure adequate rate control if stable will have pt son call nephrologist for assessment of probable fluid retention. Will await transmission results then will call the son with next steps.

## 2020-12-22 NOTE — Telephone Encounter (Signed)
appt made 3/11

## 2020-12-22 NOTE — Telephone Encounter (Signed)
Spoke with son. Son reports patient has had SOB with activity that has increased over the past 2 weeks. She has dialysis Tuesday, Thursday, Saturday and SOB does not decrease after dialysis. Patient reports no CP, chest pressure. She has been sleeping on 2 pillows which is her baseline. Son reports that patient's right foot is edematous but her LLE has no edema. No pain in RLE and RLE is warm to touch, + Eliquis.He reports he is attempting to send remote transmission from [facility] monitor. Unable to send remote transmission , [facility] tech support # provided and son will call device clinic to give update on monitor. Recommended f/u with nephrologist. ED precautions given for worsening SOB , CP, chest pressure.

## 2020-12-22 NOTE — Telephone Encounter (Signed)
Transmission received. I told the patient son that the nurse will review it and give him a call back.

## 2020-12-23 NOTE — Progress Notes (Signed)
Primary Care Physician: Lucianne Lei, MD Primary Cardiologist: Dr Johnsie Cancel Primary Electrophysiologist: Dr Rayann Heman Referring Physician: Device clinic   [name] is a 87 y.o. female with a history of SSS s/p PPM, DM, HTN, ESRD, multiple myeloma, OSA, atrial tachycardia, and atrial fibrillation who presents for follow up in the [facility]. The patient was initially diagnosed with atrial fibrillation 09/2020 on a remote device check. She was not aware of her arrhythmia although she does have intermittent SOB. Patient has a CHADS2VASC score of 5.  On follow up today, patient reports that her intermittent SOB has become more persistent. She denies any symptoms of fluid overload. She also denies any bleeding issues on anticoagulation. She has gained 1-2 lbs, her son has been pushing her to eat more.    Today, she denies symptoms of palpitations, chest pain, orthopnea, PND, lower extremity edema, dizziness, presyncope, syncope, bleeding, or neurologic sequela. The patient is tolerating medications without difficulties and is otherwise without complaint today.    Atrial Fibrillation Risk Factors:  she does have symptoms or diagnosis of sleep apnea. she is compliant with CPAP therapy. she does not have a history of rheumatic fever.   she has a BMI of Body mass index is 18.04 kg/m.Marland Kitchen Filed Weights   12/24/20 0833  Weight: 49.2 kg    Family History  Problem Relation Age of Onset   Congestive Heart Failure Mother    Tuberculosis Mother    Multiple myeloma Mother    Bone cancer Father    Arthritis Father    Breast cancer Sister    Colon cancer Sister    Hypertension Sister    Dementia Sister    Alcohol abuse Son      Atrial Fibrillation Management history:  Previous antiarrhythmic drugs: none Previous cardioversions: none Previous ablations: none CHADS2VASC score: 5 Anticoagulation history: Eliquis   Past Medical History:   Diagnosis Date   Anemia    Arthritis    Benign paroxysmal positional vertigo 04/06/2010   Benign positional vertigo    BRADYCARDIA 01/27/2009   s/p PPM   CAROTID BRUIT 4/65/0354   Complication of anesthesia    took a long time to wake up with knee replacement   DIABETES MELLITUS, TYPE II 02/24/2008   ESRD (end stage renal disease) on dialysis ([place])    tues thurs sat nw kidney center sees [place] [facility]   GERD (gastroesophageal reflux disease)    Glaucoma    Gout    HYPERTENSION 02/24/2008   Multiple myeloma ([place])    Obstructive sleep apnea    Pancytopenia ([place])    Sick sinus syndrome ([place])    Wears dentures    Wears glasses    Past Surgical History:  Procedure Laterality Date   A/V FISTULAGRAM Right 03/08/2020   Procedure: A/V FISTULAGRAM- Right Arm;  Surgeon: Waynetta Sandy, MD;  Location: [place] [facility];  Service: Cardiovascular;  Laterality: Right;   A/V FISTULAGRAM N/A 06/28/2020   Procedure: A/V FISTULAGRAM - Right Upper Arm;  Surgeon: Waynetta Sandy, MD;  Location: [place] [facility];  Service: Cardiovascular;  Laterality: N/A;   ABDOMINAL HYSTERECTOMY     1980's   AV FISTULA PLACEMENT Right 03/11/2019   Procedure: CREATION RIGHT ARM RADIOCEPHALIC ARTERIOVENOUS FISTULA;  Surgeon: Angelia Mould, MD;  Location: [place];  Service: Vascular;  Laterality: Right;   AV FISTULA PLACEMENT Right 03/25/2020   Procedure: right arm ARTERIOVENOUS (AV) FISTULA CREATION;  Surgeon: Waynetta Sandy,  MD;  Location: [place];  Service: Vascular;  Laterality: Right;   BACK SURGERY     x 3   BIOPSY  06/24/2019   Procedure: BIOPSY;  Surgeon: Ronald Lobo, MD;  [facility];  Service: Endoscopy;;   EP IMPLANTABLE DEVICE N/A 05/16/2016   Procedure: PPM Generator Changeout;  Surgeon: Thompson Grayer, MD;  Location: [place] [facility];  Service: Cardiovascular;  Laterality: N/A;   ESOPHAGOGASTRODUODENOSCOPY (EGD) WITH PROPOFOL  N/A 06/24/2019   Procedure: ESOPHAGOGASTRODUODENOSCOPY (EGD) WITH PROPOFOL;  Surgeon: Ronald Lobo, MD;  [facility];  Service: Endoscopy;  Laterality: N/A;   FISTULA SUPERFICIALIZATION Right 03/11/2019   Procedure: FISTULA SUPERFICIALIZATION;  Surgeon: Angelia Mould, MD;  Location: [place];  Service: Vascular;  Laterality: Right;   INSERTION OF DIALYSIS CATHETER Right 03/25/2020   Procedure: INSERTION OF DIALYSIS CATHETER, right internal jugular;  Surgeon: Waynetta Sandy, MD;  Location: [place];  Service: Vascular;  Laterality: Right;   IR FLUORO GUIDE CV LINE RIGHT  03/07/2019   IR US GUIDE VASC ACCESS RIGHT  03/07/2019   KNEE SURGERY Right    2003   KNEE SURGERY Left    2011   PACEMAKER INSERTION     PERIPHERAL VASCULAR BALLOON ANGIOPLASTY Right 06/28/2020   Procedure: PERIPHERAL VASCULAR BALLOON ANGIOPLASTY;  Surgeon: Waynetta Sandy, MD;  Location: [place] [facility];  Service: Cardiovascular;  Laterality: Right;    Current Outpatient Medications  Medication Sig Dispense Refill   acetaminophen (TYLENOL) 500 MG tablet Take 1,000 mg by mouth 2 (two) times daily as needed for mild pain or moderate pain.      acyclovir (ZOVIRAX) 400 MG tablet Take 1 tablet (400 mg total) by mouth daily. 90 tablet 6   allopurinol (ZYLOPRIM) 100 MG tablet Take 0.5 tablets (50 mg total) by mouth daily. 30 tablet 0   apixaban (ELIQUIS) 2.5 MG TABS tablet Take 1 tablet (2.5 mg total) by mouth 2 (two) times daily. 60 tablet 3   cholecalciferol (VITAMIN D3) 25 MCG (1000 UT) tablet Take 2,000 Units by mouth daily.     dexamethasone (DECADRON) 2 MG tablet Take 1 tablet (2 mg total) by mouth daily. 30 tablet 1   dorzolamide (TRUSOPT) 2 % ophthalmic solution Place 1 drop into the left eye 2 (two) times daily.   1   fluorometholone (FML) 0.1 % ophthalmic suspension Place 1 drop into the right eye daily.      glimepiride (AMARYL) 1 MG tablet Take 0.5 mg by mouth daily.       lidocaine-prilocaine (EMLA) cream Apply 1 application topically as needed [facility]).      LUMIGAN 0.01 % SOLN Place 1 drop into the left eye at bedtime.      LYRICA 50 MG capsule Take 1 capsule (50 mg total) by mouth at bedtime. 90 capsule 3   Methoxy PEG-Epoetin Beta (MIRCERA IJ) Mircera     metoCLOPramide (REGLAN) 5 MG tablet Take 1 tablet (5 mg total) by mouth 3 (three) times daily before meals. 90 tablet 1   midodrine (PROAMATINE) 5 MG tablet Take 5 mg by mouth as directed. Low bp, and before dialysis     ondansetron (ZOFRAN) 4 MG tablet Take 1 tablet (4 mg total) by mouth every 8 (eight) hours as needed for nausea. 60 tablet 1   oxyCODONE (OXY IR/ROXICODONE) 5 MG immediate release tablet Take 1 tablet (5 mg total) by mouth every 4 (four) hours as needed for severe pain. 60 tablet 0  pantoprazole (PROTONIX) 40 MG tablet Take 1 tablet (40 mg total) by mouth daily as needed (acid reflux/indigestion.). 30 tablet 11   polyethylene glycol (MIRALAX / GLYCOLAX) 17 g packet Take 17 g by mouth daily.     senna-docusate (SENOKOT-S) 8.6-50 MG tablet Take 2 tablets by mouth 2 (two) times daily. 30 tablet 0   sevelamer carbonate (RENVELA) 800 MG tablet Take 1,600 mg by mouth 3 (three) times daily with meals.     sodium chloride (OCEAN) 0.65 % SOLN nasal spray Place 1 spray into both nostrils 4 (four) times daily as needed for congestion.     Current Facility-Administered Medications  Medication Dose Route Frequency Provider Last Rate Last Admin   0.9 %  sodium chloride infusion  250 mL Intravenous PRN Waynetta Sandy, MD        Allergies  Allergen Reactions   Aspirin Other (See Comments)    REACTION: STOMACH ISSUES WITH DOSE HIGHER THAN 81 MG  Other reaction(s): GI Upset (intolerance), Other (See Comments) REACTION: STOMACH ISSUES WITH DOSE HIGHER THAN 81 MG  REACTION: STOMACH ISSUES WITH DOSE HIGHER THAN 81 MG    Hydrocodone-Acetaminophen Nausea And Vomiting    Morphine Nausea And Vomiting   Penicillins Rash    Patient took injection and tablets, had a reaction. She has taken amoxicillin with no reaction Has patient had a PCN reaction causing immediate rash, facial/tongue/throat swelling, SOB or lightheadedness with hypotension: Yes Has patient had a PCN reaction causing severe rash involving mucus membranes or skin necrosis: Yes Has patient had a PCN reaction that required hospitalization No Has patient had a PCN reaction occurring within the last 10 years: No If all of the above answers are "NO", then m Patient took injection and tablets, had a reaction. She has taken amoxicillin with no reaction Patient took injection and tablets, had a reaction. She has taken amoxicillin with no reaction Has patient had a PCN reaction causing immediate rash, facial/tongue/throat swelling, SOB or lightheadedness with hypotension: Yes Has patient had a PCN reaction causing severe rash involving mucus membranes or skin necrosis: Yes Has patient had a PCN reaction that required hospitalization No Has patient had a PCN reaction occurring within the last 10 years: No If all of the above answers are "NO", then m   Betaine Nausea And Vomiting   Dacarbazine Nausea And Vomiting   Ketorolac Tromethamine Itching   Brimonidine Itching   Diltiazem Hcl Rash   Hydrocodone-Acetaminophen Nausea And Vomiting   Neurontin [Gabapentin] Nausea And Vomiting   Sulfacetamide Sodium Rash    Social History   Socioeconomic History   Marital status: Married    Spouse name: Not on file   Number of children: 5   Years of education: Not on file   Highest education level: Not on file  Occupational History   Occupation: retired  Tobacco Use   Smoking status: Never Smoker   Smokeless tobacco: Never Used  Scientific laboratory technician Use: Never used  Substance and Sexual Activity   Alcohol use: No   Drug use: No   Sexual activity: Not on file  Other Topics Concern    Not on file  Social History Narrative   Retired Marine scientist   Social Determinants of Radio broadcast assistant Strain: Not on file  Food Insecurity: Not on file  Transportation Needs: Not on file  Physical Activity: Not on file  Stress: Not on file  Social Connections: Not on file  Intimate Partner Violence: Not on  file     ROS- All systems are reviewed and negative except as per the HPI above.  Physical Exam: Vitals:   12/24/20 0833  BP: 118/62  Pulse: 70  Weight: 49.2 kg  Height: 5' 5"  (1.651 m)    GEN- The patient is a frail appearing elderly female, alert and oriented x 3 today.   HEENT-head normocephalic, atraumatic, sclera clear, conjunctiva pink, hearing intact, trachea midline. Lungs- Clear to ausculation bilaterally, normal work of breathing Heart- Regular rate and rhythm, no murmurs, rubs or gallops  GI- soft, NT, ND, + BS Extremities- no clubbing, cyanosis, or edema MS- no significant deformity or atrophy Skin- no rash or lesion Psych- euthymic mood, full affect Neuro- strength and sensation are intact   Wt Readings from Last 3 Encounters:  12/24/20 49.2 kg  12/13/20 48.1 kg  12/10/20 49.5 kg    EKG today demonstrates V paced rhythm with underlying coarse afib vs flutter Vent. rate 70 BPM PR interval * ms QRS duration 180 ms QT/QTc 506/546 ms  Echo 04/02/20 demonstrated  1. There is significant apical-basal and septal-lateral pacing-induced  dyssynchrony. Left ventricular ejection fraction, by estimation, is 40 to  45%. The left ventricle has mildly decreased function. The left ventricle  demonstrates regional wall motion abnormalities (see scoring diagram/findings for description). There is moderate concentric left ventricular hypertrophy. Left ventricular diastolic parameters are consistent with Grade II diastolic dysfunction (pseudonormalization). Elevated left atrial pressure. There is moderate dyskinesis of the left ventricular, entire apical  segment, although this may be artifactual (pacing related).  2. Right ventricular systolic function is normal. The right ventricular  size is normal. Mildly increased right ventricular wall thickness. There  is mildly elevated pulmonary artery systolic pressure. The estimated right ventricular systolic pressure is 05.3 mmHg.  3. Left atrial size was mildly dilated.  4. Right atrial size was mildly dilated.  5. The mitral valve is normal in structure. Mild mitral valve  regurgitation.  6. Tricuspid valve regurgitation is mild to moderate.  7. The aortic valve is tricuspid. Aortic valve regurgitation is trivial.  Very mild aortic valve stenosis.  8. The inferior vena cava is normal in size with greater than 50%  respiratory variability, suggesting right atrial pressure of 3 mmHg.   Comparison(s): Prior images reviewed side by side. The left ventricular  function is worsened. The difference is at least in part (possibly  entirely) due to ventricular pacing (which was present only intermittently  on the 03/01/2019 study, but is seen  constantly throughout the current study).   Epic records are reviewed at length today  CHA2DS2-VASc Score = 5  The patient's score is based upon: CHF History: No HTN History: Yes Diabetes History: Yes Stroke History: No Vascular Disease History: No Age Score: 2 Gender Score: 1      ASSESSMENT AND PLAN: 1. Persistent Atrial Fibrillation/atach/atrial flutter The patient's CHA2DS2-VASc score is 5, indicating a 7.2% annual risk of stroke.   Unclear if symptoms are related to afib vs deconditioning/failure to thrive. She and her family would like a trial of SR to see if her SOB improves.  We discussed therapeutic options today including AAD, DCCV, and overdrive pacing. Her AAD options are limited by ESRD, she wants to avoid medications as she has significant trouble swallowing the pills she has currently. She would also like to avoid a procedure if  possible. Will have her follow up with EP office to consider turning on AF suppression and attempting overdrive pacing. If this is  unsuccessful, she is agreeable to DCCV. Continue Eliquis 2.5 mg BID (age, weight, Cr >1.5).  2. Secondary Hypercoagulable State (ICD10:  D68.69) The patient is at significant risk for stroke/thromboembolism based upon her CHA2DS2-VASc Score of 5.  Continue Apixaban (Eliquis).   3. SSS/2nd degree AV block S/p PPM, followed by Dr Rayann Heman and the device clinic.  4. Obstructive sleep apnea Patient reports compliance with CPAP therapy.  5. ESRD HD TTS  6. Nausea/vomiting  Chronic followed by PCP, oncology.   Follow up with Dr Rayann Heman.    [facility] [address] [place], [place] [address] [phone] 12/24/2020 8:43 AM

## 2020-12-24 ENCOUNTER — Ambulatory Visit (HOSPITAL_COMMUNITY)
Admission: RE | Admit: 2020-12-24 | Discharge: 2020-12-24 | Disposition: A | Discharge status: Home / Self Care | Payer: Medicare PPO | Source: Ambulatory Visit | Attending: Physician Assistant | Admitting: Physician Assistant

## 2020-12-24 ENCOUNTER — Encounter (HOSPITAL_COMMUNITY): Payer: Self-pay | Admitting: Physician Assistant

## 2020-12-24 ENCOUNTER — Other Ambulatory Visit: Payer: Self-pay

## 2020-12-24 VITALS — BP 118/62 | HR 70 | Ht 65.0 in | Wt 108.4 lb

## 2020-12-24 DIAGNOSIS — Z8249 Family history of ischemic heart disease and other diseases of the circulatory system: Secondary | ICD-10-CM | POA: Diagnosis not present

## 2020-12-24 DIAGNOSIS — R0602 Shortness of breath: Secondary | ICD-10-CM | POA: Insufficient documentation

## 2020-12-24 DIAGNOSIS — Z880 Allergy status to penicillin: Secondary | ICD-10-CM | POA: Insufficient documentation

## 2020-12-24 DIAGNOSIS — Z888 Allergy status to other drugs, medicaments and biological substances status: Secondary | ICD-10-CM | POA: Diagnosis not present

## 2020-12-24 DIAGNOSIS — I4819 Other persistent atrial fibrillation: Secondary | ICD-10-CM | POA: Diagnosis present

## 2020-12-24 DIAGNOSIS — I4892 Unspecified atrial flutter: Secondary | ICD-10-CM | POA: Diagnosis not present

## 2020-12-24 DIAGNOSIS — G4733 Obstructive sleep apnea (adult) (pediatric): Secondary | ICD-10-CM | POA: Insufficient documentation

## 2020-12-24 DIAGNOSIS — Z885 Allergy status to narcotic agent status: Secondary | ICD-10-CM | POA: Diagnosis not present

## 2020-12-24 DIAGNOSIS — D6869 Other thrombophilia: Secondary | ICD-10-CM | POA: Insufficient documentation

## 2020-12-24 DIAGNOSIS — R112 Nausea with vomiting, unspecified: Secondary | ICD-10-CM | POA: Insufficient documentation

## 2020-12-24 DIAGNOSIS — N186 End stage renal disease: Secondary | ICD-10-CM | POA: Insufficient documentation

## 2020-12-24 DIAGNOSIS — Z7901 Long term (current) use of anticoagulants: Secondary | ICD-10-CM | POA: Diagnosis not present

## 2020-12-24 DIAGNOSIS — Z79899 Other long term (current) drug therapy: Secondary | ICD-10-CM | POA: Diagnosis not present

## 2020-12-24 DIAGNOSIS — I441 Atrioventricular block, second degree: Secondary | ICD-10-CM | POA: Diagnosis not present

## 2020-12-24 DIAGNOSIS — Z886 Allergy status to analgesic agent status: Secondary | ICD-10-CM | POA: Insufficient documentation

## 2020-12-24 DIAGNOSIS — Z950 Presence of cardiac pacemaker: Secondary | ICD-10-CM | POA: Diagnosis not present

## 2020-12-24 DIAGNOSIS — I495 Sick sinus syndrome: Secondary | ICD-10-CM | POA: Insufficient documentation

## 2020-12-27 ENCOUNTER — Telehealth: Payer: Self-pay

## 2020-12-27 NOTE — Telephone Encounter (Signed)
Called regarding urgent request for documentation for W/C. Son will ask nephrologist when they see this week about. Instructed to call the office back if needed. He verbalzied understanding.

## 2020-12-29 ENCOUNTER — Encounter: Payer: Self-pay | Admitting: Internal Medicine

## 2020-12-29 ENCOUNTER — Ambulatory Visit: Payer: Medicare PPO | Admitting: Internal Medicine

## 2020-12-29 ENCOUNTER — Other Ambulatory Visit: Payer: Self-pay

## 2020-12-29 VITALS — BP 106/52 | HR 70 | Ht 65.0 in | Wt 105.0 lb

## 2020-12-29 DIAGNOSIS — I10 Essential (primary) hypertension: Secondary | ICD-10-CM

## 2020-12-29 DIAGNOSIS — I495 Sick sinus syndrome: Secondary | ICD-10-CM

## 2020-12-29 DIAGNOSIS — I441 Atrioventricular block, second degree: Secondary | ICD-10-CM

## 2020-12-29 DIAGNOSIS — I471 Supraventricular tachycardia: Secondary | ICD-10-CM

## 2020-12-29 DIAGNOSIS — G4733 Obstructive sleep apnea (adult) (pediatric): Secondary | ICD-10-CM | POA: Diagnosis not present

## 2020-12-29 LAB — CUP PACEART INCLINIC DEVICE CHECK
Battery Remaining Longevity: 98 mo
Battery Voltage: 2.96 V
Date Time Interrogation Session: 20220316154455
Implantable Lead Implant Date: 20100422
Implantable Lead Implant Date: 20100422
Implantable Lead Location: 753859
Implantable Lead Location: 753860
Implantable Pulse Generator Implant Date: 20170801
Lead Channel Impedance Value: 312.5 Ohm
Lead Channel Impedance Value: 337.5 Ohm
Lead Channel Setting Pacing Amplitude: 2 V
Lead Channel Setting Pacing Amplitude: 2.5 V
Lead Channel Setting Pacing Pulse Width: 0.5 ms
Lead Channel Setting Sensing Sensitivity: 2 mV
Pulse Gen Model: 2272
Pulse Gen Serial Number: 7929552

## 2020-12-29 NOTE — Progress Notes (Signed)
PCP: Lucianne Lei, MD   Primary EP:  Dr Rayann Heman  [name] is a 87 y.o. female who presents today for routine electrophysiology followup.  She has had marked decline since I saw her last.  She has lost 70 lbs over the few years due to myeloma, renal failure and treatment. She has developed persistent atypical atrial flutter.  + SOB and fatigue. She is not very active.  Past Medical History:  Diagnosis Date  . Anemia   . Arthritis   . Benign paroxysmal positional vertigo 04/06/2010  . Benign positional vertigo   . 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 CAROTID BRUIT 02/24/2008  . Complication of anesthesia    took a long time to wake up with knee replacement  . DIABETES MELLITUS, TYPE II 02/24/2008  . ESRD (end stage renal disease) on dialysis ([place])    tues thurs sat nw kidney center sees [place] [facility]  . GERD (gastroesophageal reflux disease)   . Glaucoma   . Gout   . HYPERTENSION 02/24/2008  . Multiple myeloma ([place])   . Obstructive sleep apnea   . Pancytopenia ([place])   . Sick sinus syndrome ([place])   . Wears dentures   . Wears glasses    Past Surgical History:  Procedure Laterality Date  . A/V FISTULAGRAM Right 03/08/2020   Procedure: A/V FISTULAGRAM- Right Arm;  Surgeon: Waynetta Sandy, MD;  Location: [place] [facility];  Service: Cardiovascular;  Laterality: Right;  . A/V FISTULAGRAM N/A 06/28/2020   Procedure: A/V FISTULAGRAM - Right Upper Arm;  Surgeon: Waynetta Sandy, MD;  Location: [place] [facility];  Service: Cardiovascular;  Laterality: N/A;  . ABDOMINAL HYSTERECTOMY     1980's  . AV FISTULA PLACEMENT Right 03/11/2019   Procedure: CREATION RIGHT ARM RADIOCEPHALIC ARTERIOVENOUS FISTULA;  Surgeon: Angelia Mould, MD;  Location: [place];  Service: Vascular;  Laterality: Right;  . AV FISTULA PLACEMENT Right 03/25/2020   Procedure: right arm ARTERIOVENOUS (AV) FISTULA CREATION;  Surgeon: Waynetta Sandy, MD;  Location: [place];   Service: Vascular;  Laterality: Right;  . BACK SURGERY     x 3  . BIOPSY  06/24/2019   Procedure: BIOPSY;  Surgeon: Ronald Lobo, MD;  [facility];  Service: Endoscopy;;  . EP IMPLANTABLE DEVICE N/A 05/16/2016   Procedure: PPM Generator Changeout;  Surgeon: Thompson Grayer, MD;  Location: [place] [facility];  Service: Cardiovascular;  Laterality: N/A;  . ESOPHAGOGASTRODUODENOSCOPY (EGD) WITH PROPOFOL N/A 06/24/2019   Procedure: ESOPHAGOGASTRODUODENOSCOPY (EGD) WITH PROPOFOL;  Surgeon: Ronald Lobo, MD;  [facility];  Service: Endoscopy;  Laterality: N/A;  . FISTULA SUPERFICIALIZATION Right 03/11/2019   Procedure: FISTULA SUPERFICIALIZATION;  Surgeon: Angelia Mould, MD;  Location: [place];  Service: Vascular;  Laterality: Right;  . INSERTION OF DIALYSIS CATHETER Right 03/25/2020   Procedure: INSERTION OF DIALYSIS CATHETER, right internal jugular;  Surgeon: Waynetta Sandy, MD;  Location: [place];  Service: Vascular;  Laterality: Right;  . IR FLUORO GUIDE CV LINE RIGHT  03/07/2019  . IR US GUIDE VASC ACCESS RIGHT  03/07/2019  . KNEE SURGERY Right    2003  . KNEE SURGERY Left    2011  . PACEMAKER INSERTION    . PERIPHERAL VASCULAR BALLOON ANGIOPLASTY Right 06/28/2020   Procedure: PERIPHERAL VASCULAR BALLOON ANGIOPLASTY;  Surgeon: Waynetta Sandy, MD;  Location: [place] [facility];  Service: Cardiovascular;  Laterality: Right;    ROS- all systems are reviewed and  negative except as per HPI above  Current Outpatient Medications  Medication Sig Dispense Refill  . acetaminophen (TYLENOL) 500 MG tablet Take 1,000 mg by mouth 2 (two) times daily as needed for mild pain or moderate pain.     Marland Kitchen acyclovir (ZOVIRAX) 400 MG tablet Take 1 tablet (400 mg total) by mouth daily. 90 tablet 6  . allopurinol (ZYLOPRIM) 100 MG tablet Take 0.5 tablets (50 mg total) by mouth daily. 30 tablet 0  . apixaban (ELIQUIS) 2.5 MG TABS tablet Take 1 tablet (2.5 mg total) by mouth  2 (two) times daily. 60 tablet 3  . cholecalciferol (VITAMIN D3) 25 MCG (1000 UT) tablet Take 2,000 Units by mouth daily.    Marland Kitchen dexamethasone (DECADRON) 2 MG tablet Take 1 tablet (2 mg total) by mouth daily. 30 tablet 1  . dorzolamide (TRUSOPT) 2 % ophthalmic solution Place 1 drop into the left eye 2 (two) times daily.   1  . fluorometholone (FML) 0.1 % ophthalmic suspension Place 1 drop into the right eye daily.     Marland Kitchen glimepiride (AMARYL) 1 MG tablet Take 0.5 mg by mouth daily.     Marland Kitchen lidocaine-prilocaine (EMLA) cream Apply 1 application topically as needed [facility]).     Marland Kitchen LUMIGAN 0.01 % SOLN Place 1 drop into the left eye at bedtime.     Marland Kitchen LYRICA 50 MG capsule Take 1 capsule (50 mg total) by mouth at bedtime. 90 capsule 3  . Methoxy PEG-Epoetin Beta (MIRCERA IJ) Mircera    . metoCLOPramide (REGLAN) 5 MG tablet Take 1 tablet (5 mg total) by mouth 3 (three) times daily before meals. 90 tablet 1  . midodrine (PROAMATINE) 5 MG tablet Take 5 mg by mouth as directed. Low bp, and before dialysis    . ondansetron (ZOFRAN) 4 MG tablet Take 1 tablet (4 mg total) by mouth every 8 (eight) hours as needed for nausea. 60 tablet 1  . oxyCODONE (OXY IR/ROXICODONE) 5 MG immediate release tablet Take 1 tablet (5 mg total) by mouth every 4 (four) hours as needed for severe pain. 60 tablet 0  . pantoprazole (PROTONIX) 40 MG tablet Take 1 tablet (40 mg total) by mouth daily as needed (acid reflux/indigestion.). 30 tablet 11  . polyethylene glycol (MIRALAX / GLYCOLAX) 17 g packet Take 17 g by mouth daily.    Marland Kitchen senna-docusate (SENOKOT-S) 8.6-50 MG tablet Take 2 tablets by mouth 2 (two) times daily. 30 tablet 0  . sevelamer carbonate (RENVELA) 800 MG tablet Take 1,600 mg by mouth 3 (three) times daily with meals.    . sodium chloride (OCEAN) 0.65 % SOLN nasal spray Place 1 spray into both nostrils 4 (four) times daily as needed for congestion.     Current Facility-Administered Medications  Medication Dose Route Frequency  Provider Last Rate Last Admin  . 0.9 %  sodium chloride infusion  250 mL Intravenous PRN Maeola Harman, MD        Physical Exam: Vitals:   12/29/20 1510  BP: (!) 106/52  Pulse: 70  SpO2: 99%  Weight: 105 lb (47.6 kg)  Height: 5\' 5"  (1.651 m)    GEN- The patient is chronically ill appearing, alert and oriented x 3 today.   Head- normocephalic, atraumatic Eyes-  Sclera clear, conjunctiva pink Ears- hearing intact Oropharynx- clear Lungs- Clear to ausculation bilaterally, normal work of breathing Chest- pacemaker pocket is well healed Heart- Regular rate and rhythm (paced) GI- soft  Extremities- no clubbing, cyanosis, + dependant edema  Pacemaker interrogation- reviewed in detail today,  See PACEART report  ekg tracing ordered today is personally reviewed and shows atypical atrial flutter,  V paced  Assessment and Plan:  1. Symptomatic sinus bradycardia and second degree heart block Normal pacemaker function See Pace Art report No changes today she is not device dependant today  2. Persistent afib/ atach/ atrial flutter chads2vasc score is 5.  She is on eliquis. She is in atypical atrial flutter today.  I did try NIPS through her pacemaker but was not successful in terminating atrial arrhythmias. We discussed option of cardioversion. Risks and benefits to the procedure were discussed in detail with patient and son (caregiver) including but not limited to stroke.  She wishes to proceed.  We will schedule cardioversion at the next available time. She is rate controlled. If she fails cardioversion, our options are limited.  Her only AAD option is amiodarone.  She has chronic nausea which would reduce my enthusiasm for this medicine.  3. OSA Compliant with CPAP  4. ESRD  On HD  5. HTN Stable No change required today   Risks, benefits and potential toxicities for medications prescribed and/or refilled reviewed with patient today.   AF clinic in 1 week  after cardioversion  Thompson Grayer MD, [facility] 12/29/2020 3:14 PM

## 2020-12-29 NOTE — Patient Instructions (Addendum)
Medication Instructions:  Your physician recommends that you continue on your current medications as directed. Please refer to the Current Medication list given to you today.  Labwork: None ordered.  Testing/Procedures: Your physician has recommended that you have a Cardioversion (DCCV). Electrical Cardioversion uses a jolt of electricity to your heart either through paddles or wired patches attached to your chest. This is a controlled, usually prescheduled, procedure. Defibrillation is done under light anesthesia in the hospital, and you usually go home the day of the procedure. This is done to get your heart back into a normal rhythm. You are not awake for the procedure. Please see the instruction sheet given to you today.  Follow-Up: Your physician wants you to follow-up in: 6 months with Tommye Standard PA   Any Other Special Instructions Will Be Listed Below (If Applicable).  If you need a refill on your cardiac medications before your next appointment, please call your pharmacy.          COVID TEST--March 21 at 2 pm-- You will go to [facility] [place], [place] [address]  for your Covid testing.   This is a drive thru test site.   Be sure to share with the first checkpoint that you are there for pre-procedure/surgery testing. Stay in your car and the nurse team will come to your car to test you.  After you are tested please go home and self-quarantine until the day of your procedure.     You are scheduled for a Cardioversion on January 05, 2021 with Dr. Acie Fredrickson.  Please arrive at the [facility] (Main Entrance A) at [facility]: [address] [place], [place] [address] at 8:30 am.   DIET: Nothing to eat or drink after midnight except a sip of water with medications (see medication instructions below)  Medication Instructions: Hold Glimepiride  Continue your anticoagulant: Eliquis You will need to continue your anticoagulant after your procedure until you  are told by your   Provider that it is safe to stop  You must have a responsible person to drive you home and stay in the waiting area during your procedure. Failure to do so could result in cancellation.  Bring your insurance cards.  *Special Note: Every effort is made to have your procedure done on time. Occasionally there are emergencies that occur at the hospital that may cause delays. Please be patient if a delay does occur.

## 2020-12-30 ENCOUNTER — Other Ambulatory Visit: Payer: Self-pay | Admitting: Hematology and Oncology

## 2020-12-30 ENCOUNTER — Other Ambulatory Visit: Payer: Self-pay

## 2020-12-30 ENCOUNTER — Telehealth: Payer: Self-pay

## 2020-12-30 ENCOUNTER — Inpatient Hospital Stay (HOSPITAL_COMMUNITY)
Admission: EM | Admit: 2020-12-30 | Discharge: 2021-01-07 | DRG: 291 | Disposition: A | Discharge status: Home Health | Payer: Medicare PPO | Attending: Internal Medicine | Admitting: Internal Medicine

## 2020-12-30 ENCOUNTER — Emergency Department (HOSPITAL_COMMUNITY): Payer: Medicare PPO

## 2020-12-30 DIAGNOSIS — I5043 Acute on chronic combined systolic (congestive) and diastolic (congestive) heart failure: Secondary | ICD-10-CM | POA: Diagnosis present

## 2020-12-30 DIAGNOSIS — I132 Hypertensive heart and chronic kidney disease with heart failure and with stage 5 chronic kidney disease, or end stage renal disease: Principal | ICD-10-CM | POA: Diagnosis present

## 2020-12-30 DIAGNOSIS — I429 Cardiomyopathy, unspecified: Secondary | ICD-10-CM | POA: Diagnosis present

## 2020-12-30 DIAGNOSIS — E8770 Fluid overload, unspecified: Secondary | ICD-10-CM | POA: Diagnosis present

## 2020-12-30 DIAGNOSIS — K219 Gastro-esophageal reflux disease without esophagitis: Secondary | ICD-10-CM | POA: Diagnosis present

## 2020-12-30 DIAGNOSIS — J9601 Acute respiratory failure with hypoxia: Secondary | ICD-10-CM | POA: Diagnosis not present

## 2020-12-30 DIAGNOSIS — Z950 Presence of cardiac pacemaker: Secondary | ICD-10-CM

## 2020-12-30 DIAGNOSIS — R0602 Shortness of breath: Secondary | ICD-10-CM | POA: Diagnosis not present

## 2020-12-30 DIAGNOSIS — Z7984 Long term (current) use of oral hypoglycemic drugs: Secondary | ICD-10-CM

## 2020-12-30 DIAGNOSIS — Z992 Dependence on renal dialysis: Secondary | ICD-10-CM

## 2020-12-30 DIAGNOSIS — Z803 Family history of malignant neoplasm of breast: Secondary | ICD-10-CM

## 2020-12-30 DIAGNOSIS — Z681 Body mass index (BMI) 19 or less, adult: Secondary | ICD-10-CM

## 2020-12-30 DIAGNOSIS — I313 Pericardial effusion (noninflammatory): Secondary | ICD-10-CM | POA: Diagnosis present

## 2020-12-30 DIAGNOSIS — E1152 Type 2 diabetes mellitus with diabetic peripheral angiopathy with gangrene: Secondary | ICD-10-CM | POA: Diagnosis present

## 2020-12-30 DIAGNOSIS — Z885 Allergy status to narcotic agent status: Secondary | ICD-10-CM

## 2020-12-30 DIAGNOSIS — E1165 Type 2 diabetes mellitus with hyperglycemia: Secondary | ICD-10-CM | POA: Diagnosis present

## 2020-12-30 DIAGNOSIS — R609 Edema, unspecified: Secondary | ICD-10-CM

## 2020-12-30 DIAGNOSIS — E1122 Type 2 diabetes mellitus with diabetic chronic kidney disease: Secondary | ICD-10-CM | POA: Diagnosis present

## 2020-12-30 DIAGNOSIS — R579 Shock, unspecified: Secondary | ICD-10-CM | POA: Diagnosis not present

## 2020-12-30 DIAGNOSIS — E8779 Other fluid overload: Secondary | ICD-10-CM

## 2020-12-30 DIAGNOSIS — Z7901 Long term (current) use of anticoagulants: Secondary | ICD-10-CM

## 2020-12-30 DIAGNOSIS — I10 Essential (primary) hypertension: Secondary | ICD-10-CM | POA: Diagnosis present

## 2020-12-30 DIAGNOSIS — Z882 Allergy status to sulfonamides status: Secondary | ICD-10-CM

## 2020-12-30 DIAGNOSIS — D631 Anemia in chronic kidney disease: Secondary | ICD-10-CM | POA: Diagnosis present

## 2020-12-30 DIAGNOSIS — I484 Atypical atrial flutter: Secondary | ICD-10-CM | POA: Diagnosis not present

## 2020-12-30 DIAGNOSIS — G4733 Obstructive sleep apnea (adult) (pediatric): Secondary | ICD-10-CM | POA: Diagnosis present

## 2020-12-30 DIAGNOSIS — Z8249 Family history of ischemic heart disease and other diseases of the circulatory system: Secondary | ICD-10-CM

## 2020-12-30 DIAGNOSIS — R0902 Hypoxemia: Secondary | ICD-10-CM

## 2020-12-30 DIAGNOSIS — N186 End stage renal disease: Secondary | ICD-10-CM

## 2020-12-30 DIAGNOSIS — Z20822 Contact with and (suspected) exposure to covid-19: Secondary | ICD-10-CM | POA: Diagnosis present

## 2020-12-30 DIAGNOSIS — Z8261 Family history of arthritis: Secondary | ICD-10-CM

## 2020-12-30 DIAGNOSIS — E43 Unspecified severe protein-calorie malnutrition: Secondary | ICD-10-CM | POA: Insufficient documentation

## 2020-12-30 DIAGNOSIS — I4892 Unspecified atrial flutter: Secondary | ICD-10-CM | POA: Diagnosis present

## 2020-12-30 DIAGNOSIS — Z880 Allergy status to penicillin: Secondary | ICD-10-CM

## 2020-12-30 DIAGNOSIS — N2581 Secondary hyperparathyroidism of renal origin: Secondary | ICD-10-CM | POA: Diagnosis present

## 2020-12-30 DIAGNOSIS — Z807 Family history of other malignant neoplasms of lymphoid, hematopoietic and related tissues: Secondary | ICD-10-CM

## 2020-12-30 DIAGNOSIS — I4891 Unspecified atrial fibrillation: Secondary | ICD-10-CM | POA: Diagnosis present

## 2020-12-30 DIAGNOSIS — Z79899 Other long term (current) drug therapy: Secondary | ICD-10-CM

## 2020-12-30 DIAGNOSIS — I495 Sick sinus syndrome: Secondary | ICD-10-CM | POA: Diagnosis present

## 2020-12-30 DIAGNOSIS — Z888 Allergy status to other drugs, medicaments and biological substances status: Secondary | ICD-10-CM

## 2020-12-30 DIAGNOSIS — C9000 Multiple myeloma not having achieved remission: Secondary | ICD-10-CM

## 2020-12-30 LAB — MAGNESIUM: Magnesium: 2 mg/dL (ref 1.7–2.4)

## 2020-12-30 LAB — COMPREHENSIVE METABOLIC PANEL
ALT: 18 U/L (ref 0–44)
AST: 28 U/L (ref 15–41)
Albumin: 4 g/dL (ref 3.5–5.0)
Alkaline Phosphatase: 44 U/L (ref 38–126)
Anion gap: 10 (ref 5–15)
BUN: 9 mg/dL (ref 8–23)
CO2: 30 mmol/L (ref 22–32)
Calcium: 9.1 mg/dL (ref 8.9–10.3)
Chloride: 96 mmol/L — ABNORMAL LOW (ref 98–111)
Creatinine, Ser: 1.72 mg/dL — ABNORMAL HIGH (ref 0.44–1.00)
GFR, Estimated: 28 mL/min — ABNORMAL LOW (ref >60)
Glucose, Bld: 80 mg/dL (ref 70–99)
Potassium: 3.3 mmol/L — ABNORMAL LOW (ref 3.5–5.1)
Sodium: 136 mmol/L (ref 135–145)
Total Bilirubin: 1.7 mg/dL — ABNORMAL HIGH (ref 0.3–1.2)
Total Protein: 6 g/dL — ABNORMAL LOW (ref 6.5–8.1)

## 2020-12-30 LAB — CBC
HCT: 22.6 % — ABNORMAL LOW (ref 36.0–46.0)
Hemoglobin: 8.1 g/dL — ABNORMAL LOW (ref 12.0–15.0)
MCH: 32.9 pg (ref 26.0–34.0)
MCHC: 35.8 g/dL (ref 30.0–36.0)
MCV: 91.9 fL (ref 80.0–100.0)
Platelets: 135 10*3/uL — ABNORMAL LOW (ref 150–400)
RBC: 2.46 MIL/uL — ABNORMAL LOW (ref 3.87–5.11)
RDW: 17.4 % — ABNORMAL HIGH (ref 11.5–15.5)
WBC: 7 10*3/uL (ref 4.0–10.5)
nRBC: 0 % (ref 0.0–0.2)

## 2020-12-30 LAB — CBC WITH DIFFERENTIAL/PLATELET
Abs Immature Granulocytes: 0.02 10*3/uL (ref 0.00–0.07)
Basophils Absolute: 0 10*3/uL (ref 0.0–0.1)
Basophils Relative: 0 %
Eosinophils Absolute: 0 10*3/uL (ref 0.0–0.5)
Eosinophils Relative: 0 %
HCT: 28.6 % — ABNORMAL LOW (ref 36.0–46.0)
Hemoglobin: 9.7 g/dL — ABNORMAL LOW (ref 12.0–15.0)
Immature Granulocytes: 0 %
Lymphocytes Relative: 33 %
Lymphs Abs: 1.9 10*3/uL (ref 0.7–4.0)
MCH: 31.7 pg (ref 26.0–34.0)
MCHC: 33.9 g/dL (ref 30.0–36.0)
MCV: 93.5 fL (ref 80.0–100.0)
Monocytes Absolute: 0.3 10*3/uL (ref 0.1–1.0)
Monocytes Relative: 5 %
Neutro Abs: 3.5 10*3/uL (ref 1.7–7.7)
Neutrophils Relative %: 62 %
Platelets: 181 10*3/uL (ref 150–400)
RBC: 3.06 MIL/uL — ABNORMAL LOW (ref 3.87–5.11)
RDW: 17.4 % — ABNORMAL HIGH (ref 11.5–15.5)
WBC: 5.7 10*3/uL (ref 4.0–10.5)
nRBC: 0 % (ref 0.0–0.2)

## 2020-12-30 LAB — RESP PANEL BY RT-PCR (FLU A&B, COVID) ARPGX2
Influenza A by PCR: NEGATIVE
Influenza B by PCR: NEGATIVE
SARS Coronavirus 2 by RT PCR: NEGATIVE

## 2020-12-30 LAB — RENAL FUNCTION PANEL
Albumin: 3.1 g/dL — ABNORMAL LOW (ref 3.5–5.0)
Anion gap: 8 (ref 5–15)
BUN: 11 mg/dL (ref 8–23)
CO2: 30 mmol/L (ref 22–32)
Calcium: 8.3 mg/dL — ABNORMAL LOW (ref 8.9–10.3)
Chloride: 97 mmol/L — ABNORMAL LOW (ref 98–111)
Creatinine, Ser: 2.03 mg/dL — ABNORMAL HIGH (ref 0.44–1.00)
GFR, Estimated: 23 mL/min — ABNORMAL LOW (ref >60)
Glucose, Bld: 95 mg/dL (ref 70–99)
Phosphorus: 2.6 mg/dL (ref 2.5–4.6)
Potassium: 3.5 mmol/L (ref 3.5–5.1)
Sodium: 135 mmol/L (ref 135–145)

## 2020-12-30 LAB — LACTIC ACID, PLASMA
Lactic Acid, Venous: 2 mmol/L (ref 0.5–1.9)
Lactic Acid, Venous: 1.4 mmol/L (ref 0.5–1.9)

## 2020-12-30 LAB — GLUCOSE, CAPILLARY: Glucose-Capillary: 97 mg/dL (ref 70–99)

## 2020-12-30 LAB — CBG MONITORING, ED: Glucose-Capillary: 71 mg/dL (ref 70–99)

## 2020-12-30 LAB — TROPONIN I (HIGH SENSITIVITY)
Troponin I (High Sensitivity): 144 ng/L (ref <18)
Troponin I (High Sensitivity): 123 ng/L (ref <18)

## 2020-12-30 LAB — TSH: TSH: 2.272 u[IU]/mL (ref 0.350–4.500)

## 2020-12-30 MED ORDER — LATANOPROST 0.005 % OP SOLN
1.0000 [drp] | Freq: Every day | OPHTHALMIC | Status: DC
  Administered 2020-12-30 – 2021-01-06 (×7): 1 [drp] via OPHTHALMIC
  Filled 2020-12-30: qty 2.5

## 2020-12-30 MED ORDER — PREGABALIN 50 MG PO CAPS
50.0000 mg | ORAL_CAPSULE | Freq: Every day | ORAL | Status: DC
  Administered 2020-12-30 – 2021-01-06 (×8): 50 mg via ORAL
  Filled 2020-12-30 (×8): qty 1

## 2020-12-30 MED ORDER — DORZOLAMIDE HCL 2 % OP SOLN
1.0000 [drp] | Freq: Two times a day (BID) | OPHTHALMIC | Status: DC
  Administered 2020-12-30 – 2021-01-07 (×13): 1 [drp] via OPHTHALMIC
  Filled 2020-12-30: qty 10

## 2020-12-30 MED ORDER — APIXABAN 2.5 MG PO TABS
2.5000 mg | ORAL_TABLET | Freq: Two times a day (BID) | ORAL | Status: DC
  Administered 2020-12-30 – 2021-01-07 (×13): 2.5 mg via ORAL
  Filled 2020-12-30 (×14): qty 1

## 2020-12-30 MED ORDER — LIDOCAINE-PRILOCAINE 2.5-2.5 % EX CREA: 1.0000 "application " | TOPICAL_CREAM | CUTANEOUS | Status: DC | PRN

## 2020-12-30 MED ORDER — OXYCODONE HCL 5 MG PO TABS: 5.0000 mg | ORAL_TABLET | ORAL | 0 refills | Status: DC | PRN

## 2020-12-30 MED ORDER — VITAMIN D 25 MCG (1000 UNIT) PO TABS
2000.0000 [IU] | ORAL_TABLET | Freq: Every day | ORAL | Status: DC
  Administered 2020-12-31 – 2021-01-07 (×8): 2000 [IU] via ORAL
  Filled 2020-12-30 (×8): qty 2

## 2020-12-30 MED ORDER — ONDANSETRON HCL 4 MG PO TABS: 4.0000 mg | ORAL_TABLET | Freq: Four times a day (QID) | ORAL | Status: DC | PRN

## 2020-12-30 MED ORDER — SODIUM CHLORIDE 0.9 % IV SOLN
100.0000 mL | INTRAVENOUS | Status: DC | PRN
Start: 2020-12-30 — End: 2020-12-31

## 2020-12-30 MED ORDER — GLIMEPIRIDE 1 MG PO TABS
0.5000 mg | ORAL_TABLET | Freq: Every day | ORAL | Status: DC
  Filled 2020-12-30: qty 0.5

## 2020-12-30 MED ORDER — OXYCODONE HCL 5 MG PO TABS
5.0000 mg | ORAL_TABLET | ORAL | Status: DC | PRN
  Administered 2020-12-31 – 2021-01-07 (×12): 5 mg via ORAL
  Filled 2020-12-30 (×12): qty 1

## 2020-12-30 MED ORDER — ONE-DAILY MULTI VITAMINS PO TABS: 1.0000 | ORAL_TABLET | Freq: Every day | ORAL | Status: DC

## 2020-12-30 MED ORDER — PANTOPRAZOLE SODIUM 40 MG PO TBEC: 40.0000 mg | DELAYED_RELEASE_TABLET | Freq: Every day | ORAL | Status: DC | PRN

## 2020-12-30 MED ORDER — LIDOCAINE HCL (PF) 1 % IJ SOLN: 5.0000 mL | INTRAMUSCULAR | Status: DC | PRN

## 2020-12-30 MED ORDER — ALLOPURINOL 100 MG PO TABS
50.0000 mg | ORAL_TABLET | Freq: Every day | ORAL | Status: DC
  Administered 2020-12-31 – 2021-01-07 (×7): 50 mg via ORAL
  Filled 2020-12-30 (×8): qty 1

## 2020-12-30 MED ORDER — LACTULOSE 10 GM/15ML PO SOLN
10.0000 g | Freq: Three times a day (TID) | ORAL | Status: DC
  Administered 2020-12-30 – 2021-01-07 (×17): 10 g via ORAL
  Filled 2020-12-30 (×19): qty 15

## 2020-12-30 MED ORDER — ALTEPLASE 2 MG IJ SOLR: 2.0000 mg | Freq: Once | INTRAMUSCULAR | Status: DC | PRN

## 2020-12-30 MED ORDER — SENNOSIDES-DOCUSATE SODIUM 8.6-50 MG PO TABS
2.0000 | ORAL_TABLET | Freq: Two times a day (BID) | ORAL | Status: DC
  Administered 2020-12-30 – 2021-01-07 (×11): 2 via ORAL
  Filled 2020-12-30 (×12): qty 2

## 2020-12-30 MED ORDER — POLYETHYLENE GLYCOL 3350 17 G PO PACK
17.0000 g | PACK | Freq: Every day | ORAL | Status: DC
  Administered 2021-01-01 – 2021-01-07 (×6): 17 g via ORAL
  Filled 2020-12-30 (×7): qty 1

## 2020-12-30 MED ORDER — SODIUM CHLORIDE 0.9 % IV SOLN: 100.0000 mL | INTRAVENOUS | Status: DC | PRN

## 2020-12-30 MED ORDER — DEXAMETHASONE 2 MG PO TABS
2.0000 mg | ORAL_TABLET | Freq: Every day | ORAL | Status: DC
  Administered 2020-12-31 – 2021-01-07 (×8): 2 mg via ORAL
  Filled 2020-12-30 (×8): qty 1

## 2020-12-30 MED ORDER — MIDODRINE HCL 5 MG PO TABS
10.0000 mg | ORAL_TABLET | ORAL | Status: DC
Start: 2021-01-01 — End: 2021-01-07
  Administered 2021-01-01 – 2021-01-06 (×3): 10 mg via ORAL
  Filled 2020-12-30 (×2): qty 2

## 2020-12-30 MED ORDER — HEPARIN SODIUM (PORCINE) 1000 UNIT/ML DIALYSIS
1000.0000 [IU] | INTRAMUSCULAR | Status: DC | PRN
  Filled 2020-12-30: qty 1

## 2020-12-30 MED ORDER — ONDANSETRON HCL 4 MG/2ML IJ SOLN: 4.0000 mg | Freq: Four times a day (QID) | INTRAMUSCULAR | Status: DC | PRN

## 2020-12-30 MED ORDER — CHLORHEXIDINE GLUCONATE CLOTH 2 % EX PADS: 6.0000 | MEDICATED_PAD | Freq: Every day | CUTANEOUS | Status: DC

## 2020-12-30 MED ORDER — FLUOROMETHOLONE 0.1 % OP SUSP
1.0000 [drp] | Freq: Every day | OPHTHALMIC | Status: DC
  Administered 2020-12-31 – 2021-01-07 (×7): 1 [drp] via OPHTHALMIC
  Filled 2020-12-30: qty 5

## 2020-12-30 MED ORDER — HEPARIN SODIUM (PORCINE) 1000 UNIT/ML DIALYSIS
2000.0000 [IU] | INTRAMUSCULAR | Status: DC | PRN
Start: 2020-12-30 — End: 2020-12-31
  Filled 2020-12-30: qty 2

## 2020-12-30 MED ORDER — PENTAFLUOROPROP-TETRAFLUOROETH EX AERO
1.0000 "application " | INHALATION_SPRAY | CUTANEOUS | Status: DC | PRN
  Filled 2020-12-30: qty 116

## 2020-12-30 MED ORDER — METOCLOPRAMIDE HCL 5 MG PO TABS
5.0000 mg | ORAL_TABLET | Freq: Three times a day (TID) | ORAL | Status: DC
  Administered 2020-12-31 – 2021-01-07 (×19): 5 mg via ORAL
  Filled 2020-12-30 (×16): qty 1

## 2020-12-30 MED ORDER — ACYCLOVIR 400 MG PO TABS
400.0000 mg | ORAL_TABLET | Freq: Every day | ORAL | Status: DC
  Administered 2020-12-31 – 2021-01-07 (×8): 400 mg via ORAL
  Filled 2020-12-30 (×8): qty 1

## 2020-12-30 MED ORDER — ADULT MULTIVITAMIN W/MINERALS CH: 1.0000 | ORAL_TABLET | Freq: Every day | ORAL | Status: DC

## 2020-12-30 MED ORDER — ACETAMINOPHEN 325 MG PO TABS
650.0000 mg | ORAL_TABLET | Freq: Four times a day (QID) | ORAL | Status: DC | PRN
  Administered 2021-01-02 – 2021-01-06 (×4): 650 mg via ORAL
  Filled 2020-12-30 (×3): qty 2

## 2020-12-30 MED ORDER — ACETAMINOPHEN 650 MG RE SUPP: 650.0000 mg | Freq: Four times a day (QID) | RECTAL | Status: DC | PRN

## 2020-12-30 MED ORDER — ACETAMINOPHEN 500 MG PO TABS
500.0000 mg | ORAL_TABLET | Freq: Once | ORAL | Status: AC
  Administered 2020-12-30: 500 mg via ORAL
  Filled 2020-12-30: qty 1

## 2020-12-30 NOTE — ED Triage Notes (Addendum)
Pt BIB [facility] from [facility] for c/o worsening SOB; onset 2 days ago. Denies CP. Pt has pacemaker. Pt received partial dialysis tx today. Pt had  a cardioversion last week, however it was not successful. Pt also reports left hip pain x 3 days; denies injury/fall. H/o afib.

## 2020-12-30 NOTE — H&P (Addendum)
History and Physical    [name] QMV:[id number] DOB: [birth year] DOA: 12/30/2020  PCP: Lucianne Lei, MD  Patient coming from: Home  I have personally briefly reviewed patient's old medical records in [place]  Chief Complaint: SOB  HPI: [name] is a 87 y.o. female with medical history significant of DM2, ESRD, HTN, MM, SSS bradycardia s/p PPM.  Pt with increased SOB, fatigue, recently.  This believed to be possibly related to A.Flutter.  Dr. Rayann Heman saw pt in office yesterday, scheduled cardioversion for Wed next week.  Today pt with severe worsening of SOB.  Did have partial session of dialysis.  In to ED for severity of symptoms.  No CP, no fevers, no chills.   ED Course: COVID neg.  WBC nl.  Trops 144 and 123 (about baseline).  CXR with small B pleural effusions.  Pt with 4L o2 requirement in ED.   Review of Systems: As per HPI, otherwise all review of systems negative.  Past Medical History:  Diagnosis Date  . Anemia   . Arthritis   . Benign paroxysmal positional vertigo 04/06/2010  . Benign positional vertigo   . 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 CAROTID BRUIT 02/24/2008  . Complication of anesthesia    took a long time to wake up with knee replacement  . DIABETES MELLITUS, TYPE II 02/24/2008  . ESRD (end stage renal disease) on dialysis ([place])    tues thurs sat [facility] sees [place] [facility]  . GERD (gastroesophageal reflux disease)   . Glaucoma   . Gout   . HYPERTENSION 02/24/2008  . Multiple myeloma ([place])   . Obstructive sleep apnea   . Pancytopenia ([place])   . Sick sinus syndrome ([place])   . Wears dentures   . Wears glasses     Past Surgical History:  Procedure Laterality Date  . A/V FISTULAGRAM Right 03/08/2020   Procedure: A/V FISTULAGRAM- Right Arm;  Surgeon: Waynetta Sandy, MD;  Location: [place] [facility];  Service: Cardiovascular;  Laterality: Right;  . A/V FISTULAGRAM N/A 06/28/2020   Procedure: A/V  FISTULAGRAM - Right Upper Arm;  Surgeon: Waynetta Sandy, MD;  Location: [place] [facility];  Service: Cardiovascular;  Laterality: N/A;  . ABDOMINAL HYSTERECTOMY     1980's  . AV FISTULA PLACEMENT Right 03/11/2019   Procedure: CREATION RIGHT ARM RADIOCEPHALIC ARTERIOVENOUS FISTULA;  Surgeon: Angelia Mould, MD;  Location: [place];  Service: Vascular;  Laterality: Right;  . AV FISTULA PLACEMENT Right 03/25/2020   Procedure: right arm ARTERIOVENOUS (AV) FISTULA CREATION;  Surgeon: Waynetta Sandy, MD;  Location: [place];  Service: Vascular;  Laterality: Right;  . BACK SURGERY     x 3  . BIOPSY  06/24/2019   Procedure: BIOPSY;  Surgeon: Ronald Lobo, MD;  Location: [facility];  Service: Endoscopy;;  . EP IMPLANTABLE DEVICE N/A 05/16/2016   Procedure: PPM Generator Changeout;  Surgeon: Thompson Grayer, MD;  Location: [place] [facility];  Service: Cardiovascular;  Laterality: N/A;  . ESOPHAGOGASTRODUODENOSCOPY (EGD) WITH PROPOFOL N/A 06/24/2019   Procedure: ESOPHAGOGASTRODUODENOSCOPY (EGD) WITH PROPOFOL;  Surgeon: Ronald Lobo, MD;  Location: [facility];  Service: Endoscopy;  Laterality: N/A;  . FISTULA SUPERFICIALIZATION Right 03/11/2019   Procedure: FISTULA SUPERFICIALIZATION;  Surgeon: Angelia Mould, MD;  Location: [place];  Service: Vascular;  Laterality: Right;  . INSERTION OF DIALYSIS CATHETER Right 03/25/2020   Procedure: INSERTION OF DIALYSIS CATHETER, right internal jugular;  Surgeon: Waynetta Sandy,  MD;  Location: [place];  Service: Vascular;  Laterality: Right;  . IR FLUORO GUIDE CV LINE RIGHT  03/07/2019  . IR US GUIDE VASC ACCESS RIGHT  03/07/2019  . KNEE SURGERY Right    2003  . KNEE SURGERY Left    2011  . PACEMAKER INSERTION    . PERIPHERAL VASCULAR BALLOON ANGIOPLASTY Right 06/28/2020   Procedure: PERIPHERAL VASCULAR BALLOON ANGIOPLASTY;  Surgeon: Waynetta Sandy, MD;  Location: [place] [facility];  Service: Cardiovascular;   Laterality: Right;     reports that she has never smoked. She has never used smokeless tobacco. She reports that she does not drink alcohol and does not use drugs.  Allergies  Allergen Reactions  . Aspirin Other (See Comments)    REACTION: STOMACH ISSUES WITH DOSE HIGHER THAN 81 MG  Other reaction(s): GI Upset (intolerance), Other (See Comments) REACTION: STOMACH ISSUES WITH DOSE HIGHER THAN 81 MG  REACTION: STOMACH ISSUES WITH DOSE HIGHER THAN 81 MG   . Hydrocodone-Acetaminophen Nausea And Vomiting  . Morphine Nausea And Vomiting  . Penicillins Rash    Patient took injection and tablets, had a reaction. She has taken amoxicillin with no reaction Has patient had a PCN reaction causing immediate rash, facial/tongue/throat swelling, SOB or lightheadedness with hypotension: Yes Has patient had a PCN reaction causing severe rash involving mucus membranes or skin necrosis: Yes Has patient had a PCN reaction that required hospitalization No Has patient had a PCN reaction occurring within the last 10 years: No If all of the above answers are "NO", then m Patient took injection and tablets, had a reaction. She has taken amoxicillin with no reaction Patient took injection and tablets, had a reaction. She has taken amoxicillin with no reaction Has patient had a PCN reaction causing immediate rash, facial/tongue/throat swelling, SOB or lightheadedness with hypotension: Yes Has patient had a PCN reaction causing severe rash involving mucus membranes or skin necrosis: Yes Has patient had a PCN reaction that required hospitalization No Has patient had a PCN reaction occurring within the last 10 years: No If all of the above answers are "NO", then m  . Betaine Nausea And Vomiting  . Dacarbazine Nausea And Vomiting  . Ketorolac Tromethamine Itching  . Brimonidine Itching  . Diltiazem Hcl Rash  . Hydrocodone-Acetaminophen Nausea And Vomiting  . Neurontin [Gabapentin] Nausea And Vomiting  .  Sulfacetamide Sodium Rash    Family History  Problem Relation Age of Onset  . Congestive Heart Failure Mother   . Tuberculosis Mother   . Multiple myeloma Mother   . Bone cancer Father   . Arthritis Father   . Breast cancer Sister   . Colon cancer Sister   . Hypertension Sister   . Dementia Sister   . Alcohol abuse Son      Prior to Admission medications   Medication Sig Start Date End Date Taking? Authorizing Provider  acetaminophen (TYLENOL) 500 MG tablet Take 1,000 mg by mouth 2 (two) times daily as needed for mild pain or moderate pain.    Yes [provider]  acyclovir (ZOVIRAX) 400 MG tablet Take 1 tablet (400 mg total) by mouth daily. 09/15/20  Yes Gorsuch, Ni, MD  allopurinol (ZYLOPRIM) 100 MG tablet Take 0.5 tablets (50 mg total) by mouth daily. 03/12/19  Yes Nita Sells, MD  apixaban (ELIQUIS) 2.5 MG TABS tablet Take 1 tablet (2.5 mg total) by mouth 2 (two) times daily. 10/25/20  Yes Fenton, Clint R, PA  cholecalciferol (VITAMIN D3)  25 MCG (1000 UT) tablet Take 2,000 Units by mouth daily.   Yes [provider]  dexamethasone (DECADRON) 2 MG tablet Take 1 tablet (2 mg total) by mouth daily. 12/10/20  Yes Gorsuch, Ni, MD  dorzolamide (TRUSOPT) 2 % ophthalmic solution Place 1 drop into the left eye 2 (two) times daily.  12/21/17  Yes [provider]  fluorometholone (FML) 0.1 % ophthalmic suspension Place 1 drop into the right eye daily.  02/01/19  Yes [provider]  glimepiride (AMARYL) 1 MG tablet Take 0.5 mg by mouth daily.  04/07/20  Yes [provider]  lactulose (CHRONULAC) 10 GM/15ML solution Take 15 mLs by mouth 3 (three) times daily. 12/24/20  Yes [provider]  lidocaine-prilocaine (EMLA) cream Apply 1 application topically as needed [facility]).  05/26/20  Yes [provider]  LUMIGAN 0.01 % SOLN Place 1 drop into the left eye at bedtime.  02/01/19  Yes [provider]  LYRICA 50 MG capsule Take 1  capsule (50 mg total) by mouth at bedtime. 12/20/20  Yes Heath Lark, MD  Methoxy PEG-Epoetin Beta (MIRCERA IJ) Mircera 07/01/20 06/30/21 Yes [provider]  metoCLOPramide (REGLAN) 5 MG tablet Take 1 tablet (5 mg total) by mouth 3 (three) times daily before meals. 10/27/20  Yes Gorsuch, Ni, MD  midodrine (PROAMATINE) 5 MG tablet Take 5 mg by mouth as directed. Low bp, and before dialysis   Yes [provider]  ondansetron (ZOFRAN) 4 MG tablet Take 1 tablet (4 mg total) by mouth every 8 (eight) hours as needed for nausea. 10/27/20  Yes Gorsuch, Ni, MD  oxyCODONE (OXY IR/ROXICODONE) 5 MG immediate release tablet Take 1 tablet (5 mg total) by mouth every 4 (four) hours as needed for severe pain. 12/30/20  Yes Gorsuch, Ni, MD  pantoprazole (PROTONIX) 40 MG tablet Take 1 tablet (40 mg total) by mouth daily as needed (acid reflux/indigestion.). 06/26/19  Yes Gorsuch, Ni, MD  polyethylene glycol (MIRALAX / GLYCOLAX) 17 g packet Take 17 g by mouth daily.   Yes [provider]  senna-docusate (SENOKOT-S) 8.6-50 MG tablet Take 2 tablets by mouth 2 (two) times daily. 03/11/19  Yes Nita Sells, MD  sodium chloride (OCEAN) 0.65 % SOLN nasal spray Place 1 spray into both nostrils 4 (four) times daily as needed for congestion.   Yes [provider]  midodrine (PROAMATINE) 10 MG tablet Take 10 mg by mouth as directed. Low bp, and before dialysis 12/28/20   [provider]    Physical Exam: Vitals:   12/30/20 1945 12/30/20 2015 12/30/20 2030 12/30/20 2045  BP: (!) 110/56 (!) 107/45 99/73 (!) 113/57  Pulse: (!) 103  74 78  Resp: 19 18 16 16   Temp:      TempSrc:      SpO2: 94%  100% 100%    Constitutional: NAD, calm, comfortable Eyes: PERRL, lids and conjunctivae normal ENMT: Mucous membranes are moist. Posterior pharynx clear of any exudate or lesions.Normal dentition.  Neck: normal, supple, no masses, no thyromegaly Respiratory: B crackles Cardiovascular:  Regular rate and rhythm, no murmurs / rubs / gallops. No extremity edema. 2+ pedal pulses. No carotid bruits.  Abdomen: no tenderness, no masses palpated. No hepatosplenomegaly. Bowel sounds positive.  Musculoskeletal: no clubbing / cyanosis. No joint deformity upper and lower extremities. Good ROM, no contractures. Normal muscle tone.  Skin: no rashes, lesions, ulcers. No induration Neurologic: CN 2-12 grossly intact. Sensation intact, DTR normal. Strength 5/5 in all 4.  Psychiatric:  Normal judgment and insight. Alert and oriented x 3. Normal mood.    Labs on Admission: I have personally reviewed following labs and imaging studies  CBC: Recent Labs  Lab 12/30/20 1547  WBC 5.7  NEUTROABS 3.5  HGB 9.7*  HCT 28.6*  MCV 93.5  PLT 008   Basic Metabolic Panel: Recent Labs  Lab 12/30/20 1547  NA 136  K 3.3*  CL 96*  CO2 30  GLUCOSE 80  BUN 9  CREATININE 1.72*  CALCIUM 9.1  MG 2.0   GFR: Estimated Creatinine Clearance: 17 mL/min (A) (by C-G formula based on SCr of 1.72 mg/dL (H)). Liver Function Tests: Recent Labs  Lab 12/30/20 1547  AST 28  ALT 18  ALKPHOS 44  BILITOT 1.7*  PROT 6.0*  ALBUMIN 4.0   No results for input(s): LIPASE, AMYLASE in the last 168 hours. No results for input(s): AMMONIA in the last 168 hours. Coagulation Profile: No results for input(s): INR, PROTIME in the last 168 hours. Cardiac Enzymes: No results for input(s): CKTOTAL, CKMB, CKMBINDEX, TROPONINI in the last 168 hours. BNP (last 3 results) No results for input(s): PROBNP in the last 8760 hours. HbA1C: No results for input(s): HGBA1C in the last 72 hours. CBG: Recent Labs  Lab 12/30/20 1758  GLUCAP 71   Lipid Profile: No results for input(s): CHOL, HDL, LDLCALC, TRIG, CHOLHDL, LDLDIRECT in the last 72 hours. Thyroid Function Tests: Recent Labs    12/30/20 1547  TSH 2.272   Anemia Panel: No results for input(s): VITAMINB12, FOLATE, FERRITIN, TIBC, IRON, RETICCTPCT in the last  72 hours. Urine analysis:    Component Value Date/Time   COLORURINE YELLOW 11/27/2019 1430   APPEARANCEUR CLEAR 11/27/2019 1430   LABSPEC 1.010 11/27/2019 1430   PHURINE 9.0 (H) 11/27/2019 1430   GLUCOSEU NEGATIVE 11/27/2019 1430   HGBUR NEGATIVE 11/27/2019 1430   BILIRUBINUR NEGATIVE 11/27/2019 1430   KETONESUR NEGATIVE 11/27/2019 1430   PROTEINUR NEGATIVE 11/27/2019 1430   UROBILINOGEN 1.0 10/05/2012 1953   NITRITE NEGATIVE 11/27/2019 1430   LEUKOCYTESUR SMALL (A) 11/27/2019 1430    Radiological Exams on Admission: DG Chest Portable 1 View  Result Date: 12/30/2020 CLINICAL DATA:  Shortness of breath. EXAM: PORTABLE CHEST 1 VIEW COMPARISON:  September 11, 2020. FINDINGS: Stable cardiomediastinal silhouette. Left-sided pacemaker is unchanged in position. No pneumothorax is noted. Mild bibasilar atelectasis is noted with probable small pleural effusions. Bony thorax is unremarkable. IMPRESSION: Mild bibasilar subsegmental atelectasis with probable small pleural effusions. Aortic Atherosclerosis (ICD10-I70.0). Electronically Signed   By: Marijo Conception M.D.   On: 12/30/2020 15:56   CUP PACEART INCLINIC DEVICE CHECK  Result Date: 12/29/2020 Patient seen in device clinic today to attempt ANIPS. Was performed by Dr. Rayann Heman, patient initially was AFL and converted to AF. Would like patient to send manual transmission 12/30/20 to assess presenting. Patient will be scheduled cardioversion.Lavenia Atlas, BSN, RN   EKG: Independently reviewed.  Assessment/Plan Principal Problem:   Acute respiratory failure with hypoxia (HCC) Active Problems:   Multiple myeloma not having achieved remission (HCC)   ESRD (end stage renal disease) on dialysis ([place])   Atypical atrial flutter (HCC)   Fluid overload    1. Acute resp failure with hypoxia - 1. New 4L O2 requirement 2. Suspicion is that this is due to fluid / decompensated CHF in setting of ongoing Atypical A.Flutter. 1. COVID neg 2. No SIRS  to suggest PNA 3. Nephrology consulted by EDP, plan for dialysis tomorrow 4. Tele monitor 5.  Message sent to P. Trent for cards eval in AM (maybe try moving the cardioversion up?) 2. DM2 - 1. Cont home meds 3. ESRD - 1. Dialysis tomorrow as above 4. MM - 1. Cont decadron  DVT prophylaxis: Eliquis Code Status: Full Family Communication: No family in room Disposition Plan: Home after breathing improved, off oxygen Consults called: EDP called nephrology, message put in for P. Trent for cards eval in AM Admission status: Place in [place]    Danielle Lento, [place] [facility]  How to contact the [facility] Attending or Consulting provider [place] or covering provider during after hours [place], for this patient?  1. Check the care team in [facility] and look for a) attending/consulting [facility] provider listed and b) the [facility] team listed 2. Log into [email or web address]  Amion Physician Scheduling and messaging for groups and whole hospitals  On call and physician scheduling software for group practices, residents, hospitalists and other medical providers for call, clinic, rotation and shift schedules. On[employer] is a hospital-wide system for scheduling doctors and paging doctors on call. EasyPlot is for scientific plotting and data analysis.  [email or web address]  and use [place]'s universal password to access. If you do not have the password, please contact the hospital operator.  3. Locate the [facility] provider you are looking for under [facility] and page to a number that you can be directly reached. 4. If you still have difficulty reaching the provider, please page the [facility] (Director on Call) for the Hospitalists listed on amion for assistance.  12/30/2020, 9:20 PM

## 2020-12-30 NOTE — Consult Note (Signed)
ESRD Consult Note  Requesting provider: Etta Quill, DO  Assessment/Recommendations:   ESRD: Outpatient rx: Unit: [place]. TTS. 4hrs, 3K, 2.5Ca, Na 137, Bicarbonate 35, 400/500. Dialyzer: F180, heparin 2k bolus -will plan for HD in AM for volume removal to see if this will improve her respiratory status  Volume/ hypertension: EDW 49kg. Attempt to achieve EDW as tolerated  Anemia of Chronic Kidney Disease: Hemoglobin 9.7. Is due to start mircera 41mg q2weeks   Secondary Hyperparathyroidism/Hyperphosphatemia: hectorol 175m twice a week   Vascular access: RUE AVF: pulsatile. Is due for a fistulogram. If she is still admitted, then we can see if she can get that done here  SOB: possibly due to ongoing aflutter, does have small pleural effusions bilaterally but not sure if this is entirely the cause. Nonetheless, will plan for HD again in AM as above  Atypical aflutter -due for cardioversion next week, cardiology to be consulted  # Additional recommendations: - Dose all meds for creatinine clearance < 10 ml/min  - Unless absolutely necessary, no MRIs with gadolinium.  - Implement save arm precautions.  Prefer needle sticks in the dorsum of the hands or wrists.  No blood pressure measurements in arm. - If blood transfusion is requested during hemodialysis sessions, please alert us[place] to the session.  - If a hemodialysis catheter line culture is requested, please alert us[place]oreas only hemodialysis nurses are able to collect those specimens.   ViGean QuintMD [facility]   History of Present Illness: [name] a/an [age].o. female with a past medical history of ESRD on HD TTS, a flutter, DM 2, hypertension, MMM, sick sinus syndrome s/p PPM who presents with worsening shortness of breath, fatigue.  She did have first session of dialysis today.  Now requiring 4 L of oxygen.  She reports that they have been able to take off fluid with dialysis.  She is due  for cardioversion next Wednesday as an outpatient.  She is also due for fistulogram which she has yet to schedule.  She currently reports feeling better in regards to her breathing.  She otherwise denies any fevers, chills, chest pain, swelling.   Medications:  Current Facility-Administered Medications  Medication Dose Route Frequency Provider Last Rate Last Admin  . 0.9 %  sodium chloride infusion  250 mL Intravenous PRN CaWaynetta SandyMD      . acetaminophen (TYLENOL) tablet 650 mg  650 mg Oral Q6H PRN GaEtta QuillDO       Or  . acetaminophen (TYLENOL) suppository 650 mg  650 mg Rectal Q6H PRN GaEtta QuillDO      . [START ON 12/31/2020] acyclovir (ZOVIRAX) tablet 400 mg  400 mg Oral Daily GaJennette Kettle, DO      . [START ON 12/31/2020] allopurinol (ZYLOPRIM) tablet 50 mg  50 mg Oral Daily GaJennette Kettle, DO      . apixaban (EArne Clevelandtablet 2.5 mg  2.5 mg Oral BID GaEtta QuillDO      . [START ON 12/31/2020] cholecalciferol (VITAMIN D3) tablet 2,000 Units  2,000 Units Oral Daily GaJennette Kettle, DO      . [START ON 12/31/2020] dexamethasone (DECADRON) tablet 2 mg  2 mg Oral Daily GaAlcario DroughtJared M, DO      . dorzolamide (TRUSOPT) 2 % ophthalmic solution 1 drop  1 drop Left Eye BID GaEtta QuillDO      . [START ON 12/31/2020] fluorometholone (  FML) 0.1 % ophthalmic suspension 1 drop  1 drop Right Eye Daily Etta Quill, DO      . [START ON 12/31/2020] glimepiride (AMARYL) tablet 0.5 mg  0.5 mg Oral Daily Alcario Drought, Jared M, DO      . lactulose (CHRONULAC) 10 GM/15ML solution 10 g  10 g Oral TID Etta Quill, DO      . latanoprost (XALATAN) 0.005 % ophthalmic solution 1 drop  1 drop Left Eye QHS Jennette Kettle M, DO      . lidocaine-prilocaine (EMLA) cream 1 application  1 application Topical PRN Etta Quill, DO      . [START ON 12/31/2020] metoCLOPramide (REGLAN) tablet 5 mg  5 mg Oral TID AC Etta Quill, DO      . [START ON 01/01/2021] midodrine  (PROAMATINE) tablet 10 mg  10 mg Oral Q T,Th,Sat-1800 Jennette Kettle M, DO      . [START ON 12/31/2020] multivitamin with minerals tablet 1 tablet  1 tablet Oral Daily Alcario Drought, Jared M, DO      . ondansetron [facility]) tablet 4 mg  4 mg Oral Q6H PRN Etta Quill, DO       Or  . ondansetron [facility]) injection 4 mg  4 mg Intravenous Q6H PRN Etta Quill, DO      . oxyCODONE (Oxy IR/ROXICODONE) immediate release tablet 5 mg  5 mg Oral Q4H PRN Etta Quill, DO      . pantoprazole (PROTONIX) EC tablet 40 mg  40 mg Oral Daily PRN Etta Quill, DO      . [START ON 12/31/2020] polyethylene glycol (MIRALAX / GLYCOLAX) packet 17 g  17 g Oral Daily Alcario Drought, Jared M, DO      . pregabalin (LYRICA) capsule 50 mg  50 mg Oral QHS Jennette Kettle M, DO      . senna-docusate (Senokot-S) tablet 2 tablet  2 tablet Oral BID Etta Quill, DO       Current Outpatient Medications  Medication Sig Dispense Refill  . acetaminophen (TYLENOL) 500 MG tablet Take 1,000 mg by mouth 2 (two) times daily as needed for mild pain or moderate pain.     Marland Kitchen acyclovir (ZOVIRAX) 400 MG tablet Take 1 tablet (400 mg total) by mouth daily. 90 tablet 6  . allopurinol (ZYLOPRIM) 100 MG tablet Take 0.5 tablets (50 mg total) by mouth daily. 30 tablet 0  . apixaban (ELIQUIS) 2.5 MG TABS tablet Take 1 tablet (2.5 mg total) by mouth 2 (two) times daily. 60 tablet 3  . cholecalciferol (VITAMIN D3) 25 MCG (1000 UT) tablet Take 2,000 Units by mouth daily.    Marland Kitchen dexamethasone (DECADRON) 2 MG tablet Take 1 tablet (2 mg total) by mouth daily. 30 tablet 1  . dorzolamide (TRUSOPT) 2 % ophthalmic solution Place 1 drop into the left eye 2 (two) times daily.   1  . fluorometholone (FML) 0.1 % ophthalmic suspension Place 1 drop into the right eye daily.     Marland Kitchen glimepiride (AMARYL) 1 MG tablet Take 0.5 mg by mouth daily.     Marland Kitchen lactulose (CHRONULAC) 10 GM/15ML solution Take 15 mLs by mouth 3 (three) times daily.    Marland Kitchen lidocaine-prilocaine (EMLA)  cream Apply 1 application topically as needed [facility]).     Marland Kitchen LUMIGAN 0.01 % SOLN Place 1 drop into the left eye at bedtime.     Marland Kitchen LYRICA 50 MG capsule Take 1 capsule (50 mg total) by mouth at bedtime.  90 capsule 3  . Methoxy PEG-Epoetin Beta (MIRCERA IJ) Mircera    . metoCLOPramide (REGLAN) 5 MG tablet Take 1 tablet (5 mg total) by mouth 3 (three) times daily before meals. 90 tablet 1  . midodrine (PROAMATINE) 10 MG tablet Take 10 mg by mouth as directed. Low bp, and before dialysis    . Multiple Vitamin (MULTIVITAMIN) tablet Take 1 tablet by mouth daily.    . ondansetron (ZOFRAN) 4 MG tablet Take 1 tablet (4 mg total) by mouth every 8 (eight) hours as needed for nausea. 60 tablet 1  . oxyCODONE (OXY IR/ROXICODONE) 5 MG immediate release tablet Take 1 tablet (5 mg total) by mouth every 4 (four) hours as needed for severe pain. 60 tablet 0  . pantoprazole (PROTONIX) 40 MG tablet Take 1 tablet (40 mg total) by mouth daily as needed (acid reflux/indigestion.). 30 tablet 11  . polyethylene glycol (MIRALAX / GLYCOLAX) 17 g packet Take 17 g by mouth daily.    Marland Kitchen senna-docusate (SENOKOT-S) 8.6-50 MG tablet Take 2 tablets by mouth 2 (two) times daily. 30 tablet 0  . sodium chloride (OCEAN) 0.65 % SOLN nasal spray Place 1 spray into both nostrils 4 (four) times daily as needed for congestion.       ALLERGIES Aspirin, Hydrocodone-acetaminophen, Morphine, Penicillins, Betaine, Dacarbazine, Ketorolac tromethamine, Brimonidine, Diltiazem hcl, Hydrocodone-acetaminophen, Neurontin [gabapentin], and Sulfacetamide sodium  MEDICAL HISTORY Past Medical History:  Diagnosis Date  . Anemia   . Arthritis   . Benign paroxysmal positional vertigo 04/06/2010  . Benign positional vertigo   . 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 CAROTID BRUIT 02/24/2008  . Complication of anesthesia    took a long time to wake up with knee replacement  . DIABETES MELLITUS, TYPE II 02/24/2008  . ESRD (end stage renal disease) on dialysis  ([place])    t[facility] thurs [facility] sees [place] [facility]  . GERD (gastroesophageal reflux disease)   . Glaucoma   . Gout   . HYPERTENSION 02/24/2008  . Multiple myeloma ([place])   . Obstructive sleep apnea   . Pancytopenia ([place])   . Sick sinus syndrome ([place])   . Wears dentures   . Wears glasses      SOCIAL HISTORY Social History   Socioeconomic History  . Marital status: Married    Spouse name: Not on file  . Number of children: 5  . Years of education: Not on file  . Highest education level: Not on file  Occupational History  . Occupation: retired  Tobacco Use  . Smoking status: Never Smoker  . Smokeless tobacco: Never Used  Vaping Use  . Vaping Use: Never used  Substance and Sexual Activity  . Alcohol use: No  . Drug use: No  . Sexual activity: Not on file  Other Topics Concern  . Not on file  Social History Narrative   Retired Marine scientist   Social Determinants of Radio broadcast assistant Strain: Not on file  Food Insecurity: Not on file  Transportation Needs: Not on file  Physical Activity: Not on file  Stress: Not on file  Social Connections: Not on file  Intimate Partner Violence: Not on file     FAMILY HISTORY Family History  Problem Relation Age of Onset  . Congestive Heart Failure Mother   . Tuberculosis Mother   . Multiple myeloma Mother   . Bone cancer Father   . Arthritis Father   . Breast cancer Sister   . Colon cancer  Sister   . Hypertension Sister   . Dementia Sister   . Alcohol abuse Son      Review of Systems: 12 systems were reviewed and negative except per HPI  Physical Exam: Vitals:   12/30/20 2030 12/30/20 2045  BP: 99/73 (!) 113/57  Pulse: 74 78  Resp: 16 16  Temp:    SpO2: 100% 100%   No intake/output data recorded. No intake or output data in the 24 hours ending 12/30/20 2147 General: well-appearing, no acute distress HEENT: anicteric sclera, MMM CV: normal rate, no murmurs Lungs: cta bl, bilateral chest rise,  normal wob, [place] in place Abd: soft, non-tender, non-distended Msk: no edema, RUE avf pulsatile Neuro: normal speech, no gross focal deficits   Test Results Reviewed Lab Results  Component Value Date   NA 136 12/30/2020   K 3.3 (L) 12/30/2020   CL 96 (L) 12/30/2020   CO2 30 12/30/2020   BUN 9 12/30/2020   CREATININE 1.72 (H) 12/30/2020   CALCIUM 9.1 12/30/2020   ALBUMIN 4.0 12/30/2020   PHOS 2.2 (L) 09/12/2020    I have reviewed relevant outside healthcare records

## 2020-12-30 NOTE — Telephone Encounter (Signed)
Called back and told son Rx sent. He verbalized understanding. Faxed requested [place] requesting information regarding w/c to Nephrologist at [phone]. Received confirmation.

## 2020-12-30 NOTE — ED Provider Notes (Signed)
[facility] EMERGENCY DEPARTMENT Provider Note   CSN: [id number] Arrival date & time: 12/30/20  1511     History Chief Complaint  Patient presents with  . Shortness of Breath    [name] is a 87 y.o. female.  The history is provided by the patient and medical records. No language interpreter was used.  Shortness of Breath Severity:  Severe Onset quality:  Gradual Duration:  1 day Timing:  Constant Progression:  Unchanged Chronicity:  New Context: not URI   Relieved by:  Nothing Worsened by:  Exertion Ineffective treatments:  None tried Associated symptoms: no abdominal pain, no chest pain, no cough, no diaphoresis, no fever, no headaches, no neck pain, no rash, no sputum production, no swollen glands, no vomiting and no wheezing   Risk factors: hx of cancer   Risk factors: no hx of PE/DVT        Past Medical History:  Diagnosis Date  . Anemia   . Arthritis   . Benign paroxysmal positional vertigo 04/06/2010  . Benign positional vertigo   . 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 CAROTID BRUIT 02/24/2008  . Complication of anesthesia    took a long time to wake up with knee replacement  . DIABETES MELLITUS, TYPE II 02/24/2008  . ESRD (end stage renal disease) on dialysis ([place])    tues thurs sat [facility] sees [place] [facility]  . GERD (gastroesophageal reflux disease)   . Glaucoma   . Gout   . HYPERTENSION 02/24/2008  . Multiple myeloma ([place])   . Obstructive sleep apnea   . Pancytopenia ([place])   . Sick sinus syndrome ([place])   . Wears dentures   . Wears glasses     Patient Active Problem List   Diagnosis Date Noted  . Weight loss, non-intentional 12/10/2020  . Pain due to onychomycosis of toenails of both feet 12/08/2020  . Type 2 diabetes mellitus with vascular disease ([place]) 12/08/2020  . Persistent atrial fibrillation ([place]) 11/08/2020  . Gastritis 10/27/2020  . Delayed gastric emptying 10/27/2020  . Atypical atrial flutter ([place])  10/25/2020  . Secondary hypercoagulable state ([place]) 10/25/2020  . Pulmonary edema 09/10/2020  . Pleural effusion, bilateral 09/10/2020  . Acute respiratory failure with hypoxia ([place]) 04/02/2020  . Elevated troponin 04/02/2020  . Hypotension 03/23/2020  . Left hip pain 01/12/2020  . Vitamin B12 deficiency 10/20/2019  . Deficiency anemia 09/26/2019  . Anaphylactic shock, unspecified, sequela 07/25/2019  . Complication of vascular dialysis catheter 07/21/2019  . Peripheral neuropathy due to chemotherapy ([place]) 07/04/2019  . Encounter for removal of sutures 07/03/2019  . Goals of care, counseling/discussion 05/30/2019  . Pressure injury of skin 05/26/2019  . Yeast infection of the skin 04/04/2019  . Cancer associated pain 03/26/2019  . Moderate protein-calorie malnutrition ([place]) 03/24/2019  . Nausea in adult 03/14/2019  . ESRD (end stage renal disease) on dialysis ([place]) 03/14/2019  . Other constipation 03/14/2019  . Coagulation defect, unspecified ([place]) 03/13/2019  . Pancytopenia, acquired ([place]) 03/12/2019  . Anemia in chronic kidney disease 03/11/2019  . Bradycardia, unspecified 03/11/2019  . Iron deficiency anemia, unspecified 03/11/2019  . Secondary hyperparathyroidism of renal origin ([place]) 03/11/2019  . Multiple myeloma not having achieved remission ([place]) 03/03/2019  . Hypokalemia 02/25/2019  . Anemia   . Shingles 03/09/2015  . Sick sinus syndrome ([place]) 02/03/2015  . Shortness of breath 02/01/2014  . Fatigue 02/01/2014  . BENIGN PAROXYSMAL POSITIONAL VERTIGO 04/06/2010  . BRADYCARDIA 01/27/2009  .  Diabetes mellitus ([place]) 02/24/2008  . Obstructive sleep apnea 02/24/2008  . 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 Essential hypertension 02/24/2008  . ALLERGIC RHINITIS 02/24/2008  . CAROTID BRUIT 02/24/2008    Past Surgical History:  Procedure Laterality Date  . A/V FISTULAGRAM Right 03/08/2020   Procedure: A/V FISTULAGRAM- Right Arm;  Surgeon: Waynetta Sandy, MD;  Location: [facility];  Service: Cardiovascular;  Laterality: Right;  . A/V FISTULAGRAM N/A 06/28/2020   Procedure: A/V FISTULAGRAM - Right Upper Arm;  Surgeon: Waynetta Sandy, MD;  Location: [facility];  Service: Cardiovascular;  Laterality: N/A;  . ABDOMINAL HYSTERECTOMY     1980's  . AV FISTULA PLACEMENT Right 03/11/2019   Procedure: CREATION RIGHT ARM RADIOCEPHALIC ARTERIOVENOUS FISTULA;  Surgeon: Angelia Mould, MD;  Location: [facility];  Service: Vascular;  Laterality: Right;  . AV FISTULA PLACEMENT Right 03/25/2020   Procedure: right arm ARTERIOVENOUS (AV) FISTULA CREATION;  Surgeon: Waynetta Sandy, MD;  Location: [facility];  Service: Vascular;  Laterality: Right;  . BACK SURGERY     x 3  . BIOPSY  06/24/2019   Procedure: BIOPSY;  Surgeon: Ronald Lobo, MD;  Location: [facility];  Service: Endoscopy;;  . EP IMPLANTABLE DEVICE N/A 05/16/2016   Procedure: PPM Generator Changeout;  Surgeon: Thompson Grayer, MD;  Location: [facility];  Service: Cardiovascular;  Laterality: N/A;  . ESOPHAGOGASTRODUODENOSCOPY (EGD) WITH PROPOFOL N/A 06/24/2019   Procedure: ESOPHAGOGASTRODUODENOSCOPY (EGD) WITH PROPOFOL;  Surgeon: Ronald Lobo, MD;  [facility];  Service: Endoscopy;  Laterality: N/A;  . FISTULA SUPERFICIALIZATION Right 03/11/2019   Procedure: FISTULA SUPERFICIALIZATION;  Surgeon: Angelia Mould, MD;  Location: [facility];  Service: Vascular;  Laterality: Right;  . INSERTION OF DIALYSIS CATHETER Right 03/25/2020   Procedure: INSERTION OF DIALYSIS CATHETER, right internal jugular;  Surgeon: Waynetta Sandy, MD;  Location: [facility];  Service: Vascular;  Laterality: Right;  . IR FLUORO GUIDE CV LINE RIGHT  03/07/2019  . IR US GUIDE VASC ACCESS RIGHT  03/07/2019  . KNEE SURGERY Right    2003  . KNEE SURGERY Left    2011  . PACEMAKER INSERTION    . PERIPHERAL VASCULAR BALLOON ANGIOPLASTY Right 06/28/2020   Procedure:  PERIPHERAL VASCULAR BALLOON ANGIOPLASTY;  Surgeon: Waynetta Sandy, MD;  Location: [facility];  Service: Cardiovascular;  Laterality: Right;     OB History   No obstetric history on file.     Family History  Problem Relation Age of Onset  . Congestive Heart Failure Mother   . Tuberculosis Mother   . Multiple myeloma Mother   . Bone cancer Father   . Arthritis Father   . Breast cancer Sister   . Colon cancer Sister   . Hypertension Sister   . Dementia Sister   . Alcohol abuse Son     Social History   Tobacco Use  . Smoking status: Never Smoker  . Smokeless tobacco: Never Used  Vaping Use  . Vaping Use: Never used  Substance Use Topics  . Alcohol use: No  . Drug use: No    Home Medications Prior to Admission medications   Medication Sig Start Date End Date Taking? Authorizing Provider  acetaminophen (TYLENOL) 500 MG tablet Take 1,000 mg by mouth 2 (two) times daily as needed for mild pain or moderate pain.     [provider]  acyclovir (ZOVIRAX) 400 MG tablet Take 1 tablet (400 mg total) by mouth  daily. 09/15/20   Heath Lark, MD  allopurinol (ZYLOPRIM) 100 MG tablet Take 0.5 tablets (50 mg total) by mouth daily. 03/12/19   Nita Sells, MD  apixaban (ELIQUIS) 2.5 MG TABS tablet Take 1 tablet (2.5 mg total) by mouth 2 (two) times daily. 10/25/20   Fenton, Clint R, PA  cholecalciferol (VITAMIN D3) 25 MCG (1000 UT) tablet Take 2,000 Units by mouth daily.    [provider]  dexamethasone (DECADRON) 2 MG tablet Take 1 tablet (2 mg total) by mouth daily. 12/10/20   Heath Lark, MD  dorzolamide (TRUSOPT) 2 % ophthalmic solution Place 1 drop into the left eye 2 (two) times daily.  12/21/17   [provider]  fluorometholone (FML) 0.1 % ophthalmic suspension Place 1 drop into the right eye daily.  02/01/19   [provider]  glimepiride (AMARYL) 1 MG tablet Take 0.5 mg by mouth daily.  04/07/20   [provider]   lidocaine-prilocaine (EMLA) cream Apply 1 application topically as needed [facility]  05/26/20   [provider]  LUMIGAN 0.01 % SOLN Place 1 drop into the left eye at bedtime.  02/01/19   [provider]  LYRICA 50 MG capsule Take 1 capsule (50 mg total) by mouth at bedtime. 12/20/20   Heath Lark, MD  Methoxy PEG-Epoetin Beta (MIRCERA IJ) Mircera 07/01/20 06/30/21  [provider]  metoCLOPramide (REGLAN) 5 MG tablet Take 1 tablet (5 mg total) by mouth 3 (three) times daily before meals. 10/27/20   Heath Lark, MD  midodrine (PROAMATINE) 5 MG tablet Take 5 mg by mouth as directed. Low bp, and before dialysis    [provider]  ondansetron (ZOFRAN) 4 MG tablet Take 1 tablet (4 mg total) by mouth every 8 (eight) hours as needed for nausea. 10/27/20   Heath Lark, MD  oxyCODONE (OXY IR/ROXICODONE) 5 MG immediate release tablet Take 1 tablet (5 mg total) by mouth every 4 (four) hours as needed for severe pain. 12/30/20   Heath Lark, MD  pantoprazole (PROTONIX) 40 MG tablet Take 1 tablet (40 mg total) by mouth daily as needed (acid reflux/indigestion.). 06/26/19   Heath Lark, MD  polyethylene glycol (MIRALAX / GLYCOLAX) 17 g packet Take 17 g by mouth daily.    [provider]  senna-docusate (SENOKOT-S) 8.6-50 MG tablet Take 2 tablets by mouth 2 (two) times daily. 03/11/19   Nita Sells, MD  sevelamer carbonate (RENVELA) 800 MG tablet Take 1,600 mg by mouth 3 (three) times daily with meals. 04/25/19   [provider]  sodium chloride (OCEAN) 0.65 % SOLN nasal spray Place 1 spray into both nostrils 4 (four) times daily as needed for congestion.    [provider]    Allergies    Aspirin, Hydrocodone-acetaminophen, Morphine, Penicillins, Betaine, Dacarbazine, Ketorolac tromethamine, Brimonidine, Diltiazem hcl, Hydrocodone-acetaminophen, Neurontin [gabapentin], and Sulfacetamide sodium  Review of Systems   Review of Systems  Constitutional:  Positive for fatigue. Negative for chills, diaphoresis and fever.  HENT: Negative for congestion.   Eyes: Negative for visual disturbance.  Respiratory: Positive for shortness of breath. Negative for cough, sputum production, chest tightness and wheezing.   Cardiovascular: Negative for chest pain, palpitations and leg swelling.  Gastrointestinal: Negative for abdominal pain, constipation, diarrhea, nausea and vomiting.  Genitourinary: Negative for dysuria and frequency.  Musculoskeletal: Positive for back pain (chronic). Negative for neck pain and neck stiffness.  Skin: Negative for rash and wound.  Neurological: Negative for dizziness, light-headedness, numbness and headaches.  Psychiatric/Behavioral:  Negative for agitation and confusion.  All other systems reviewed and are negative.   Physical Exam Updated Vital Signs There were no vitals taken for this visit.  Physical Exam Vitals and nursing note reviewed.  Constitutional:      General: She is not in acute distress.    Appearance: She is well-developed. She is not ill-appearing, toxic-appearing or diaphoretic.  HENT:     Head: Normocephalic and atraumatic.  Eyes:     Extraocular Movements: Extraocular movements intact.     Conjunctiva/sclera: Conjunctivae normal.  Cardiovascular:     Rate and Rhythm: Normal rate and regular rhythm.  No extrasystoles are present.    Heart sounds: No murmur heard.   Pulmonary:     Effort: Pulmonary effort is normal. No respiratory distress.     Breath sounds: Rales present. No decreased breath sounds, wheezing or rhonchi.  Chest:     Chest wall: No tenderness.  Abdominal:     Palpations: Abdomen is soft.     Tenderness: There is no abdominal tenderness.  Musculoskeletal:     Cervical back: Neck supple.     Right lower leg: No tenderness. No edema.     Left lower leg: No tenderness. No edema.  Skin:    General: Skin is warm and dry.  Neurological:     General: No focal deficit  present.     Mental Status: She is alert.  Psychiatric:        Mood and Affect: Mood normal.     ED Results / Procedures / Treatments   Labs (all labs ordered are listed, but only abnormal results are displayed) Labs Reviewed  CBC WITH DIFFERENTIAL/PLATELET - Abnormal; Notable for the following components:      Result Value   RBC 3.06 (*)    Hemoglobin 9.7 (*)    HCT 28.6 (*)    RDW 17.4 (*)    All other components within normal limits  COMPREHENSIVE METABOLIC PANEL - Abnormal; Notable for the following components:   Potassium 3.3 (*)    Chloride 96 (*)    Creatinine, Ser 1.72 (*)    Total Protein 6.0 (*)    Total Bilirubin 1.7 (*)    GFR, Estimated 28 (*)    All other components within normal limits  LACTIC ACID, PLASMA - Abnormal; Notable for the following components:   Lactic Acid, Venous 2.0 (*)    All other components within normal limits  TROPONIN I (HIGH SENSITIVITY) - Abnormal; Notable for the following components:   Troponin I (High Sensitivity) 144 (*)    All other components within normal limits  TROPONIN I (HIGH SENSITIVITY) - Abnormal; Notable for the following components:   Troponin I (High Sensitivity) 123 (*)    All other components within normal limits  RESP PANEL BY RT-PCR (FLU A&B, COVID) ARPGX2  MAGNESIUM  TSH  LACTIC ACID, PLASMA  CBC  BASIC METABOLIC PANEL  RENAL FUNCTION PANEL  CBC  CBG MONITORING, ED    EKG EKG Interpretation  Date/Time:  Thursday December 30 2020 15:12:27 EDT Ventricular Rate:  70 PR Interval:    QRS Duration: 182 QT Interval:  538 QTC Calculation: 581 R Axis:   -72 Text Interpretation: Age not entered, assumed to be  87 years old for purpose of ECG interpretation Sinus rhythm IVCD, consider atypical RBBB LVH with IVCD, LAD and secondary repol abnrm Prolonged QT interval Probable RV involvement, suggest recording right precordial leads paced When compared to prior.similar appearance. No STEMI Confirmed  by Antony Blackbird  [id number]) on 12/30/2020 3:14:37 PM   Radiology DG Chest Portable 1 View  Result Date: 12/30/2020 CLINICAL DATA:  Shortness of breath. EXAM: PORTABLE CHEST 1 VIEW COMPARISON:  September 11, 2020. FINDINGS: Stable cardiomediastinal silhouette. Left-sided pacemaker is unchanged in position. No pneumothorax is noted. Mild bibasilar atelectasis is noted with probable small pleural effusions. Bony thorax is unremarkable. IMPRESSION: Mild bibasilar subsegmental atelectasis with probable small pleural effusions. Aortic Atherosclerosis (ICD10-I70.0). Electronically Signed   By: Marijo Conception M.D.   On: 12/30/2020 15:56   CUP PACEART INCLINIC DEVICE CHECK  Result Date: 12/29/2020 Patient seen in device clinic today to attempt ANIPS. Was performed by Dr. Rayann Heman, patient initially was AFL and converted to AF. Would like patient to send manual transmission 12/30/20 to assess presenting. Patient will be scheduled cardioversion.Lavenia Atlas, BSN, RN   Procedures Procedures   Medications Ordered in ED Medications  acetaminophen (TYLENOL) tablet 650 mg (has no administration in time range)    Or  acetaminophen (TYLENOL) suppository 650 mg (has no administration in time range)  ondansetron (ZOFRAN) tablet 4 mg (has no administration in time range)    Or  ondansetron (ZOFRAN) injection 4 mg (has no administration in time range)  apixaban (ELIQUIS) tablet 2.5 mg (has no administration in time range)  acyclovir (ZOVIRAX) tablet 400 mg (has no administration in time range)  allopurinol (ZYLOPRIM) tablet 50 mg (has no administration in time range)  dexamethasone (DECADRON) tablet 2 mg (has no administration in time range)  dorzolamide (TRUSOPT) 2 % ophthalmic solution 1 drop (has no administration in time range)  midodrine (PROAMATINE) tablet 10 mg (has no administration in time range)  senna-docusate (Senokot-S) tablet 2 tablet (has no administration in time range)  pantoprazole (PROTONIX) EC tablet 40 mg  (has no administration in time range)  polyethylene glycol (MIRALAX / GLYCOLAX) packet 17 g (has no administration in time range)  oxyCODONE (Oxy IR/ROXICODONE) immediate release tablet 5 mg (has no administration in time range)  metoCLOPramide (REGLAN) tablet 5 mg (has no administration in time range)  pregabalin (LYRICA) capsule 50 mg (has no administration in time range)  latanoprost (XALATAN) 0.005 % ophthalmic solution 1 drop (has no administration in time range)  glimepiride (AMARYL) tablet 0.5 mg (has no administration in time range)  lactulose (CHRONULAC) 10 GM/15ML solution 10 g (has no administration in time range)  lidocaine-prilocaine (EMLA) cream 1 application (has no administration in time range)  fluorometholone (FML) 0.1 % ophthalmic suspension 1 drop (has no administration in time range)  cholecalciferol (VITAMIN D3) tablet 2,000 Units (has no administration in time range)  multivitamin with minerals tablet 1 tablet (has no administration in time range)  Chlorhexidine Gluconate Cloth 2 % PADS 6 each (has no administration in time range)  pentafluoroprop-tetrafluoroeth (GEBAUERS) aerosol 1 application (has no administration in time range)  lidocaine (PF) (XYLOCAINE) 1 % injection 5 mL (has no administration in time range)  0.9 %  sodium chloride infusion (has no administration in time range)  0.9 %  sodium chloride infusion (has no administration in time range)  heparin injection 1,000 Units (has no administration in time range)  alteplase (CATHFLO ACTIVASE) injection 2 mg (has no administration in time range)  heparin injection 2,000 Units (has no administration in time range)  acetaminophen (TYLENOL) tablet 500 mg (500 mg Oral Given 12/30/20 1848)    ED Course  I have reviewed the triage vital signs and the nursing notes.  Pertinent labs & imaging results that  were available during my care of the patient were reviewed by me and considered in my medical decision making (see  chart for details).    MDM Rules/Calculators/A&P                          [name] is a 87 y.o. female with a past medical history significant for diabetes, hypertension, sick sinus syndrome status post pacemaker, multiple myeloma, ESRD on dialysis Tuesday Thursday Saturday, chronic A. fib on Eliquis therapy, and GERD who presents with hypoxia and shortness of breath.  According to patient, she saw her cardiology team yesterday and was doing relatively well however, she says today while at dialysis she began having worsened shortness of breath.  She had to stop dialysis around one fourth of the way into her treatment and came to the emergency department.  She reports she is still feeling short of breath but denies any chest pain.  She denies any fevers, chills, congestion, cough, nausea, vomiting, constipation, diarrhea, and she reports no changes in the small amount of urine she makes every morning.  She says her legs do not feel more swollen and she has been taking all her blood thinners.  She arrived on nasal cannula oxygen supplementation but was discontinued on arrival.  On my initial evaluation, her oxygen saturations did drop into the 70s on room air so she was placed back on 4 L with some improvement.  She is still feeling short of breath but is denying any other complaints whatsoever.  She also denies chest pain, palpitations, abdominal pain, or any injuries.  EKG shows a paced rhythm.  Do not suspect STEMI given lack of any chest pain and similar appearance to prior EKG.  Will interrogate pacemaker.  On exam, her lungs did have some faint rales but there is no significant wheezing or significant rhonchi.  Chest and abdomen were nontender.  Legs had minimal edema.  Palpable pulses in all extremities.  Patient resting but still tachypneic and short of breath.  Due to her new hypoxia, anticipate she will need admission and if it appears that fluid is the problem, we will likely touch  base with nephrology for further dialysis.  If a cardiac etiology is suspected, we will likely touch base with the cardiology team whom she saw yesterday.  Although she denies any infectious symptoms, we will get a Covid test, Chest x-ray, and other labs.  Due to the hypoxia, anticipate admission.  Work-up returned showing elevated troponin but downtrending.  Other labs similar to prior.  Lactic acid slightly elevated but downtrending.  Covid and flu negative.  Chest x-ray shows effusions.  Clinically she seems fluid overloaded with her new hypoxia.  Just spoke to nephrology reports they do not have emergent dialysis available overnight but will dialyze her in the morning.  They recommended admission to medicine.  Will call for admission for new hypoxia and needing dialysis in the morning   Final Clinical Impression(s) / ED Diagnoses Final diagnoses:  Hypoxia  SOB (shortness of breath)  Hypervolemia, unspecified hypervolemia type   Clinical Impression: 1. Hypoxia   2. SOB (shortness of breath)   3. Hypervolemia, unspecified hypervolemia type     Disposition: Admit  This note was prepared with assistance of Dragon voice recognition software. Occasional wrong-word or sound-a-like substitutions may have occurred due to the inherent limitations of voice recognition software.     Lajeana Strough, Gwenyth Allegra, MD 12/30/20 2238

## 2020-12-30 NOTE — ED Notes (Signed)
Report to 10M attempted.

## 2020-12-30 NOTE — Telephone Encounter (Signed)
Son called and requested refill on Oxycodone Rx for [name].

## 2020-12-30 NOTE — Telephone Encounter (Signed)
done

## 2020-12-31 ENCOUNTER — Other Ambulatory Visit: Payer: Self-pay

## 2020-12-31 DIAGNOSIS — J9601 Acute respiratory failure with hypoxia: Secondary | ICD-10-CM | POA: Diagnosis present

## 2020-12-31 DIAGNOSIS — I4819 Other persistent atrial fibrillation: Secondary | ICD-10-CM | POA: Diagnosis not present

## 2020-12-31 DIAGNOSIS — I495 Sick sinus syndrome: Secondary | ICD-10-CM | POA: Diagnosis present

## 2020-12-31 DIAGNOSIS — I5020 Unspecified systolic (congestive) heart failure: Secondary | ICD-10-CM | POA: Diagnosis not present

## 2020-12-31 DIAGNOSIS — R579 Shock, unspecified: Secondary | ICD-10-CM | POA: Diagnosis not present

## 2020-12-31 DIAGNOSIS — E43 Unspecified severe protein-calorie malnutrition: Secondary | ICD-10-CM | POA: Diagnosis present

## 2020-12-31 DIAGNOSIS — Z885 Allergy status to narcotic agent status: Secondary | ICD-10-CM | POA: Diagnosis not present

## 2020-12-31 DIAGNOSIS — E1152 Type 2 diabetes mellitus with diabetic peripheral angiopathy with gangrene: Secondary | ICD-10-CM | POA: Diagnosis present

## 2020-12-31 DIAGNOSIS — G4733 Obstructive sleep apnea (adult) (pediatric): Secondary | ICD-10-CM | POA: Diagnosis present

## 2020-12-31 DIAGNOSIS — E1165 Type 2 diabetes mellitus with hyperglycemia: Secondary | ICD-10-CM | POA: Diagnosis present

## 2020-12-31 DIAGNOSIS — Z880 Allergy status to penicillin: Secondary | ICD-10-CM | POA: Diagnosis not present

## 2020-12-31 DIAGNOSIS — Z20822 Contact with and (suspected) exposure to covid-19: Secondary | ICD-10-CM | POA: Diagnosis present

## 2020-12-31 DIAGNOSIS — I484 Atypical atrial flutter: Secondary | ICD-10-CM | POA: Diagnosis present

## 2020-12-31 DIAGNOSIS — I5030 Unspecified diastolic (congestive) heart failure: Secondary | ICD-10-CM | POA: Diagnosis not present

## 2020-12-31 DIAGNOSIS — E1122 Type 2 diabetes mellitus with diabetic chronic kidney disease: Secondary | ICD-10-CM | POA: Diagnosis present

## 2020-12-31 DIAGNOSIS — Z882 Allergy status to sulfonamides status: Secondary | ICD-10-CM | POA: Diagnosis not present

## 2020-12-31 DIAGNOSIS — I5043 Acute on chronic combined systolic (congestive) and diastolic (congestive) heart failure: Secondary | ICD-10-CM | POA: Diagnosis present

## 2020-12-31 DIAGNOSIS — I10 Essential (primary) hypertension: Secondary | ICD-10-CM | POA: Diagnosis not present

## 2020-12-31 DIAGNOSIS — I313 Pericardial effusion (noninflammatory): Secondary | ICD-10-CM | POA: Diagnosis present

## 2020-12-31 DIAGNOSIS — I429 Cardiomyopathy, unspecified: Secondary | ICD-10-CM | POA: Diagnosis not present

## 2020-12-31 DIAGNOSIS — C9000 Multiple myeloma not having achieved remission: Secondary | ICD-10-CM | POA: Diagnosis present

## 2020-12-31 DIAGNOSIS — Z992 Dependence on renal dialysis: Secondary | ICD-10-CM | POA: Diagnosis not present

## 2020-12-31 DIAGNOSIS — Z950 Presence of cardiac pacemaker: Secondary | ICD-10-CM | POA: Diagnosis not present

## 2020-12-31 DIAGNOSIS — E8770 Fluid overload, unspecified: Secondary | ICD-10-CM | POA: Diagnosis not present

## 2020-12-31 DIAGNOSIS — Z681 Body mass index (BMI) 19 or less, adult: Secondary | ICD-10-CM | POA: Diagnosis not present

## 2020-12-31 DIAGNOSIS — N186 End stage renal disease: Secondary | ICD-10-CM | POA: Diagnosis present

## 2020-12-31 DIAGNOSIS — R0602 Shortness of breath: Secondary | ICD-10-CM | POA: Diagnosis present

## 2020-12-31 DIAGNOSIS — I4891 Unspecified atrial fibrillation: Secondary | ICD-10-CM | POA: Diagnosis not present

## 2020-12-31 DIAGNOSIS — N2581 Secondary hyperparathyroidism of renal origin: Secondary | ICD-10-CM | POA: Diagnosis present

## 2020-12-31 DIAGNOSIS — K219 Gastro-esophageal reflux disease without esophagitis: Secondary | ICD-10-CM | POA: Diagnosis present

## 2020-12-31 DIAGNOSIS — I132 Hypertensive heart and chronic kidney disease with heart failure and with stage 5 chronic kidney disease, or end stage renal disease: Secondary | ICD-10-CM | POA: Diagnosis present

## 2020-12-31 DIAGNOSIS — D631 Anemia in chronic kidney disease: Secondary | ICD-10-CM | POA: Diagnosis present

## 2020-12-31 DIAGNOSIS — I4892 Unspecified atrial flutter: Secondary | ICD-10-CM | POA: Diagnosis present

## 2020-12-31 LAB — BASIC METABOLIC PANEL
Anion gap: 7 (ref 5–15)
BUN: 12 mg/dL (ref 8–23)
CO2: 31 mmol/L (ref 22–32)
Calcium: 8.1 mg/dL — ABNORMAL LOW (ref 8.9–10.3)
Chloride: 97 mmol/L — ABNORMAL LOW (ref 98–111)
Creatinine, Ser: 2.1 mg/dL — ABNORMAL HIGH (ref 0.44–1.00)
GFR, Estimated: 22 mL/min — ABNORMAL LOW (ref >60)
Glucose, Bld: 86 mg/dL (ref 70–99)
Potassium: 3.4 mmol/L — ABNORMAL LOW (ref 3.5–5.1)
Sodium: 135 mmol/L (ref 135–145)

## 2020-12-31 LAB — FERRITIN: Ferritin: 1226 ng/mL — ABNORMAL HIGH (ref 11–307)

## 2020-12-31 LAB — CBC
HCT: 20.7 % — ABNORMAL LOW (ref 36.0–46.0)
Hemoglobin: 7.2 g/dL — ABNORMAL LOW (ref 12.0–15.0)
MCH: 32.3 pg (ref 26.0–34.0)
MCHC: 34.8 g/dL (ref 30.0–36.0)
MCV: 92.8 fL (ref 80.0–100.0)
Platelets: 128 10*3/uL — ABNORMAL LOW (ref 150–400)
RBC: 2.23 MIL/uL — ABNORMAL LOW (ref 3.87–5.11)
RDW: 17.4 % — ABNORMAL HIGH (ref 11.5–15.5)
WBC: 5.8 10*3/uL (ref 4.0–10.5)
nRBC: 0 % (ref 0.0–0.2)

## 2020-12-31 LAB — IRON AND TIBC
Iron: 63 ug/dL (ref 28–170)
Saturation Ratios: 37 % — ABNORMAL HIGH (ref 10.4–31.8)
TIBC: 171 ug/dL — ABNORMAL LOW (ref 250–450)
UIBC: 108 ug/dL

## 2020-12-31 LAB — GLUCOSE, CAPILLARY
Glucose-Capillary: 70 mg/dL (ref 70–99)
Glucose-Capillary: 101 mg/dL — ABNORMAL HIGH (ref 70–99)
Glucose-Capillary: 96 mg/dL (ref 70–99)
Glucose-Capillary: 80 mg/dL (ref 70–99)
Glucose-Capillary: 102 mg/dL — ABNORMAL HIGH (ref 70–99)

## 2020-12-31 LAB — HEMOGLOBIN A1C
Hgb A1c MFr Bld: 4.6 % — ABNORMAL LOW (ref 4.8–5.6)
Mean Plasma Glucose: 85.32 mg/dL

## 2020-12-31 LAB — MRSA PCR SCREENING: MRSA by PCR: NEGATIVE

## 2020-12-31 MED ORDER — SODIUM CHLORIDE 0.9 % IV SOLN: 100.0000 mL | INTRAVENOUS | Status: DC | PRN

## 2020-12-31 MED ORDER — DARBEPOETIN ALFA 25 MCG/0.42ML IJ SOSY
25.0000 ug | PREFILLED_SYRINGE | INTRAMUSCULAR | Status: DC
  Administered 2020-12-31: 25 ug via INTRAVENOUS
  Filled 2020-12-31: qty 0.42

## 2020-12-31 MED ORDER — RENA-VITE PO TABS
1.0000 | ORAL_TABLET | Freq: Every day | ORAL | Status: DC
  Administered 2020-12-31 – 2021-01-06 (×6): 1 via ORAL
  Filled 2020-12-31 (×6): qty 1

## 2020-12-31 MED ORDER — NEPRO/CARBSTEADY PO LIQD
237.0000 mL | Freq: Two times a day (BID) | ORAL | Status: DC
  Administered 2021-01-01 – 2021-01-07 (×10): 237 mL via ORAL

## 2020-12-31 MED ORDER — PROSOURCE PLUS PO LIQD
30.0000 mL | Freq: Three times a day (TID) | ORAL | Status: DC
  Administered 2020-12-31 – 2021-01-07 (×17): 30 mL via ORAL
  Filled 2020-12-31 (×16): qty 30

## 2020-12-31 MED ORDER — INSULIN ASPART 100 UNIT/ML ~~LOC~~ SOLN
0.0000 [IU] | Freq: Three times a day (TID) | SUBCUTANEOUS | Status: DC
  Administered 2021-01-01: 1 [IU] via SUBCUTANEOUS

## 2020-12-31 NOTE — Consult Note (Signed)
Cardiology Admission History and Physical:   Patient ID: [name] MRN: [id number]; DOB: 1930-05-16   Admission date: 12/30/2020  PCP:  Lucianne Lei, MD   [place]  Cardiologist:  Jenkins Rouge, MD  Advanced Practice Provider:  No care team member to display Electrophysiologist:  Thompson Grayer, MD       Chief Complaint:  AFl  Patient Profile:   [name] is a 87 y.o. female with SSS s/p PPM HTN with Dm, HFrEF ESRD on HD, OSA on CPAP, MM recently seen by EP.  Patient had developed new atypical atrial flutter on seeing Dr. Rayann Heman 12/29/20.  History of Present Illness:   Ms. Mullan has declined further since 12/29/20 visit.  Patient was planned for DCCV in one week from this event because she had symptomatic DOE.  This has progressed to SOB at rest.  Only received partial dialysis because of shortness of breath with HD (received ~ 1/4 of treatment).  SOB persistent to the point she was BIBEMS on O2.  In ED patient was requiring 4 L O2.  EKG showed AV paced rhythm similar to prior.  Denies CP or any other symptoms outside of fatigued and SOB.  Denied LE edema to multiple providers.  Had negative COVID and Flu testing; CXR consistent with bilateral pleural effusions.  Received HD today and felt no change despite full treatment.  Notes that she did not get any AC this morning.  Son (through [facility]) notes that he wasn't sure if she received AC last PM as well, though MAR confirms dosing.  Patient and son are not sure they need oxygen in the first place.  Past Medical History:  Diagnosis Date  . Anemia   . Arthritis   . Benign paroxysmal positional vertigo 04/06/2010  . Benign positional vertigo   . 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 CAROTID BRUIT 02/24/2008  . Complication of anesthesia    took a long time to wake up with knee replacement  . DIABETES MELLITUS, TYPE II 02/24/2008  . ESRD (end stage renal disease) on dialysis ([place])    tues thurs  sat [facility] kidney center sees [place] [facility]  . GERD (gastroesophageal reflux disease)   . Glaucoma   . Gout   . HYPERTENSION 02/24/2008  . Multiple myeloma ([place])   . Obstructive sleep apnea   . Pancytopenia ([place])   . Sick sinus syndrome ([place])   . Wears dentures   . Wears glasses     Past Surgical History:  Procedure Laterality Date  . A/V FISTULAGRAM Right 03/08/2020   Procedure: A/V FISTULAGRAM- Right Arm;  Surgeon: Waynetta Sandy, MD;  Location: [facility];  Service: Cardiovascular;  Laterality: Right;  . A/V FISTULAGRAM N/A 06/28/2020   Procedure: A/V FISTULAGRAM - Right Upper Arm;  Surgeon: Waynetta Sandy, MD;  Location: [facility];  Service: Cardiovascular;  Laterality: N/A;  . ABDOMINAL HYSTERECTOMY     1980's  . AV FISTULA PLACEMENT Right 03/11/2019   Procedure: CREATION RIGHT ARM RADIOCEPHALIC ARTERIOVENOUS FISTULA;  Surgeon: Angelia Mould, MD;  Location: [facility];  Service: Vascular;  Laterality: Right;  . AV FISTULA PLACEMENT Right 03/25/2020   Procedure: right arm ARTERIOVENOUS (AV) FISTULA CREATION;  Surgeon: Waynetta Sandy, MD;  Location: [facility];  Service: Vascular;  Laterality: Right;  . BACK SURGERY     x 3  . BIOPSY  06/24/2019   Procedure: BIOPSY;  Surgeon: Ronald Lobo, MD;  Location: [facility];  Service: Endoscopy;;  . EP IMPLANTABLE DEVICE N/A 05/16/2016   Procedure: PPM Generator Changeout;  Surgeon: Thompson Grayer, MD;  Location: [facility];  Service: Cardiovascular;  Laterality: N/A;  . ESOPHAGOGASTRODUODENOSCOPY (EGD) WITH PROPOFOL N/A 06/24/2019   Procedure: ESOPHAGOGASTRODUODENOSCOPY (EGD) WITH PROPOFOL;  Surgeon: Ronald Lobo, MD;  Location: [facility];  Service: Endoscopy;  Laterality: N/A;  . FISTULA SUPERFICIALIZATION Right 03/11/2019   Procedure: FISTULA SUPERFICIALIZATION;  Surgeon: Angelia Mould, MD;  Location: [facility];  Service: Vascular;  Laterality: Right;  . INSERTION OF DIALYSIS  CATHETER Right 03/25/2020   Procedure: INSERTION OF DIALYSIS CATHETER, right internal jugular;  Surgeon: Waynetta Sandy, MD;  Location: [facility];  Service: Vascular;  Laterality: Right;  . IR FLUORO GUIDE CV LINE RIGHT  03/07/2019  . IR US GUIDE VASC ACCESS RIGHT  03/07/2019  . KNEE SURGERY Right    2003  . KNEE SURGERY Left    2011  . PACEMAKER INSERTION    . PERIPHERAL VASCULAR BALLOON ANGIOPLASTY Right 06/28/2020   Procedure: PERIPHERAL VASCULAR BALLOON ANGIOPLASTY;  Surgeon: Waynetta Sandy, MD;  Location: [facility];  Service: Cardiovascular;  Laterality: Right;     Medications Prior to Admission: Prior to Admission medications   Medication Sig Start Date End Date Taking? Authorizing Provider  acetaminophen (TYLENOL) 500 MG tablet Take 1,000 mg by mouth 2 (two) times daily as needed for mild pain or moderate pain.    Yes [provider]  acyclovir (ZOVIRAX) 400 MG tablet Take 1 tablet (400 mg total) by mouth daily. 09/15/20  Yes Gorsuch, Ni, MD  allopurinol (ZYLOPRIM) 100 MG tablet Take 0.5 tablets (50 mg total) by mouth daily. 03/12/19  Yes Nita Sells, MD  apixaban (ELIQUIS) 2.5 MG TABS tablet Take 1 tablet (2.5 mg total) by mouth 2 (two) times daily. 10/25/20  Yes Fenton, Clint R, PA  cholecalciferol (VITAMIN D3) 25 MCG (1000 UT) tablet Take 2,000 Units by mouth daily.   Yes [provider]  dexamethasone (DECADRON) 2 MG tablet Take 1 tablet (2 mg total) by mouth daily. 12/10/20  Yes Gorsuch, Ni, MD  dorzolamide (TRUSOPT) 2 % ophthalmic solution Place 1 drop into the left eye 2 (two) times daily.  12/21/17  Yes [provider]  fluorometholone (FML) 0.1 % ophthalmic suspension Place 1 drop into the right eye daily.  02/01/19  Yes [provider]  glimepiride (AMARYL) 1 MG tablet Take 0.5 mg by mouth daily.  04/07/20  Yes [provider]  lactulose (CHRONULAC) 10 GM/15ML solution Take 15 mLs by mouth 3 (three) times  daily. 12/24/20  Yes [provider]  lidocaine-prilocaine (EMLA) cream Apply 1 application topically as needed [facility]).  05/26/20  Yes [provider]  LUMIGAN 0.01 % SOLN Place 1 drop into the left eye at bedtime.  02/01/19  Yes [provider]  LYRICA 50 MG capsule Take 1 capsule (50 mg total) by mouth at bedtime. 12/20/20  Yes Heath Lark, MD  Methoxy PEG-Epoetin Beta (MIRCERA IJ) Mircera 07/01/20 06/30/21 Yes [provider]  metoCLOPramide (REGLAN) 5 MG tablet Take 1 tablet (5 mg total) by mouth 3 (three) times daily before meals. 10/27/20  Yes Gorsuch, Ni, MD  midodrine (PROAMATINE) 10 MG tablet Take 10 mg by mouth as directed. Low bp, and before dialysis 12/28/20  Yes [provider]  Multiple Vitamin (MULTIVITAMIN) tablet Take 1 tablet by mouth daily.   Yes [provider]  ondansetron (ZOFRAN) 4  MG tablet Take 1 tablet (4 mg total) by mouth every 8 (eight) hours as needed for nausea. 10/27/20  Yes Gorsuch, Ni, MD  oxyCODONE (OXY IR/ROXICODONE) 5 MG immediate release tablet Take 1 tablet (5 mg total) by mouth every 4 (four) hours as needed for severe pain. 12/30/20  Yes Gorsuch, Ni, MD  pantoprazole (PROTONIX) 40 MG tablet Take 1 tablet (40 mg total) by mouth daily as needed (acid reflux/indigestion.). 06/26/19  Yes Gorsuch, Ni, MD  polyethylene glycol (MIRALAX / GLYCOLAX) 17 g packet Take 17 g by mouth daily.   Yes [provider]  senna-docusate (SENOKOT-S) 8.6-50 MG tablet Take 2 tablets by mouth 2 (two) times daily. 03/11/19  Yes Nita Sells, MD  sodium chloride (OCEAN) 0.65 % SOLN nasal spray Place 1 spray into both nostrils 4 (four) times daily as needed for congestion.   Yes [provider]     Allergies:    Allergies  Allergen Reactions  . Aspirin Other (See Comments)    REACTION: STOMACH ISSUES WITH DOSE HIGHER THAN 81 MG  Other reaction(s): GI Upset (intolerance), Other (See Comments) REACTION: STOMACH ISSUES  WITH DOSE HIGHER THAN 81 MG  REACTION: STOMACH ISSUES WITH DOSE HIGHER THAN 81 MG   . Hydrocodone-Acetaminophen Nausea And Vomiting  . Morphine Nausea And Vomiting  . Penicillins Rash    Patient took injection and tablets, had a reaction. She has taken amoxicillin with no reaction Has patient had a PCN reaction causing immediate rash, facial/tongue/throat swelling, SOB or lightheadedness with hypotension: Yes Has patient had a PCN reaction causing severe rash involving mucus membranes or skin necrosis: Yes Has patient had a PCN reaction that required hospitalization No Has patient had a PCN reaction occurring within the last 10 years: No If all of the above answers are "NO", then m Patient took injection and tablets, had a reaction. She has taken amoxicillin with no reaction Patient took injection and tablets, had a reaction. She has taken amoxicillin with no reaction Has patient had a PCN reaction causing immediate rash, facial/tongue/throat swelling, SOB or lightheadedness with hypotension: Yes Has patient had a PCN reaction causing severe rash involving mucus membranes or skin necrosis: Yes Has patient had a PCN reaction that required hospitalization No Has patient had a PCN reaction occurring within the last 10 years: No If all of the above answers are "NO", then m  . Betaine Nausea And Vomiting  . Dacarbazine Nausea And Vomiting  . Ketorolac Tromethamine Itching  . Brimonidine Itching  . Diltiazem Hcl Rash  . Hydrocodone-Acetaminophen Nausea And Vomiting  . Neurontin [Gabapentin] Nausea And Vomiting  . Sulfacetamide Sodium Rash    Social History:   Social History   Socioeconomic History  . Marital status: Married    Spouse name: Not on file  . Number of children: 5  . Years of education: Not on file  . Highest education level: Not on file  Occupational History  . Occupation: retired  Tobacco Use  . Smoking status: Never Smoker  . Smokeless tobacco: Never Used  Vaping  Use  . Vaping Use: Never used  Substance and Sexual Activity  . Alcohol use: No  . Drug use: No  . Sexual activity: Not on file  Other Topics Concern  . Not on file  Social History Narrative   Retired Marine scientist   Social Determinants of Radio broadcast assistant Strain: Not on file  Food Insecurity: Not on file  Transportation Needs: Not on file  Physical Activity:  Not on file  Stress: Not on file  Social Connections: Not on file  Intimate Partner Violence: Not on file    Family History:   The patient's family history includes Alcohol abuse in her son; Arthritis in her father; Bone cancer in her father; Breast cancer in her sister; Colon cancer in her sister; Congestive Heart Failure in her mother; Dementia in her sister; Hypertension in her sister; Multiple myeloma in her mother; Tuberculosis in her mother.    ROS:  Please see the history of present illness.  All other ROS reviewed and negative.     Physical Exam/Data:   Vitals:   12/31/20 1531 12/31/20 1601 12/31/20 1626 12/31/20 1728  BP: (!) 98/52 (!) 107/54 110/60 (!) 107/56  Pulse: 73 72 80 95  Resp:    18  Temp:    98 F (36.7 C)  TempSrc:      SpO2: 100% 100% 100% 98%  Weight:      Height:        Intake/Output Summary (Last 24 hours) at 12/31/2020 1857 Last data filed at 12/31/2020 1300 Gross per 24 hour  Intake 720 ml  Output 0 ml  Net 720 ml   Last 3 Weights 12/31/2020 12/30/2020 12/29/2020  Weight (lbs) 103 lb 9.9 oz 102 lb 4.7 oz 105 lb  Weight (kg) 47 kg 46.4 kg 47.628 kg     Body mass index is 17.24 kg/m.  General: Chronically ill appearing in no acute distress thin HEENT: normal Lymph: no adenopathy Neck: no JVD Endocrine:  No thryomegaly Vascular: No carotid bruits; FA pulses 2+ bilaterally without bruits  Cardiac: regular rate and rhythm S1, S2; RRR; no murmur R arm bruit good thrill Lungs:  clear to auscultation bilaterally, no wheezing, rhonchi or rales  Abd: soft, nontender, no hepatomegaly   Ext: minimal edema Musculoskeletal:  No deformities, BUE and BLE strength normal and equal Skin: warm and dry  Neuro:  CNs 2-12 intact, no focal abnormalities noted Psych:  Normal affect   EKG:  The ECG that was done  was personally reviewed and demonstrates AFL RBBB rate 70s  Relevant CV Studies:  2021 Echo  1. There is significant apical-basal and septal-lateral pacing-induced  dyssynchrony. Left ventricular ejection fraction, by estimation, is 40 to  45%. The left ventricle has mildly decreased function. The left ventricle  demonstrates regional wall motion  abnormalities (see scoring diagram/findings for description). There is  moderate concentric left ventricular hypertrophy. Left ventricular  diastolic parameters are consistent with Grade II diastolic dysfunction  (pseudonormalization). Elevated left atrial  pressure. There is moderate dyskinesis of the left ventricular, entire  apical segment, although this may be artifactual (pacing related).  2. Right ventricular systolic function is normal. The right ventricular  size is normal. Mildly increased right ventricular wall thickness. There  is mildly elevated pulmonary artery systolic pressure. The estimated right  ventricular systolic pressure is  38.8 mmHg.  3. Left atrial size was mildly dilated.  4. Right atrial size was mildly dilated.  5. The mitral valve is normal in structure. Mild mitral valve  regurgitation.  6. Tricuspid valve regurgitation is mild to moderate.  7. The aortic valve is tricuspid. Aortic valve regurgitation is trivial.  Very mild aortic valve stenosis.  8. The inferior vena cava is normal in size with greater than 50%  respiratory variability, suggesting right atrial pressure of 3 mmHg.   Laboratory Data:  High Sensitivity Troponin:   Recent Labs  Lab 12/30/20 1547 12/30/20 1740  TROPONINIHS 144* 123*      Chemistry Recent Labs  Lab 12/30/20 2303 12/31/20 0236  NA 135 135  K  3.5 3.4*  CL 97* 97*  CO2 30 31  GLUCOSE 95 86  BUN 11 12  CREATININE 2.03* 2.10*  CALCIUM 8.3* 8.1*  GFRNONAA 23* 22*  ANIONGAP 8 7    Recent Labs  Lab 12/30/20 1547 12/30/20 2303  PROT 6.0*  --   ALBUMIN 4.0 3.1*  AST 28  --   ALT 18  --   ALKPHOS 44  --   BILITOT 1.7*  --    Hematology Recent Labs  Lab 12/30/20 2303 12/31/20 0236  WBC 7.0 5.8  RBC 2.46* 2.23*  HGB 8.1* 7.2*  HCT 22.6* 20.7*  MCV 91.9 92.8  MCH 32.9 32.3  MCHC 35.8 34.8  RDW 17.4* 17.4*  PLT 135* 128*   BNPNo results for input(s): BNP, PROBNP in the last 168 hours.  DDimer No results for input(s): DDIMER in the last 168 hours.   Radiology/Studies:  No results found.   Assessment and Plan:   Atypical Atrial Flutter SSS S/p St Jude PPM - Risk factors include HTN, DM, HF - TSH WNL, imaging notable for mild left atrial dilation in the past - Will get obtain TTE- worsening HFrEF may expedite more aggressive rate control; discussions of CS lead, etc.  - Continue anticoagulation with eliquis 2.5 - Continue rate control without medication - Rhythm control options would including amiodarone; LFTs WNL on admission - Considerations for DCCV on Monday (not ordered yet)- has only missed AM dose 12/31/20   Heart Failure Reduced Ejection Fraction  - NYHA class II, Stage C, euvolemic, etiology from tachycardia favored vs pacing - Diuretic regimen: volume removal with HD - Discussed the importance of fluid restriction of < 2 L, salt restriction, and checking daily weights  - Replace electrolytes PRN and keep K>4 and Mg>2. - on no GDMT; would first to add BB, but given relative hypotension have concerns she would tolerate this - through course of exam titrated down to 3 L without issue - discussed with nursing; will continue to titrate O2 this pm.  Risk Assessment/Risk Scores:       New York Heart Association (NYHA) Functional Class NYHA Class II  CHA2DS2-VASc Score = 6  This indicates a 9.7%  annual risk of stroke. The patient's score is based upon: CHF History: Yes HTN History: Yes Diabetes History: Yes Stroke History: No Vascular Disease History: No Age Score: 2 Gender Score: 1    \ For questions or updates, please contact [facility] Please consult [email or web address] for contact info under   [address], [place], [place] [address] [phone]  6:58 PM

## 2020-12-31 NOTE — Progress Notes (Signed)
New Admission Note:   Arrival Method: via stretcher from ED Mental Orientation: Alert and oriented x 4 Telemetry: 5M08, CCMD notified Assessment: to be completed Skin: Intact, warm and dry IV: LAC saline locked Pain:0/10 Tubes: None Safety Measures: Safety Fall Prevention Plan has been discussed  Admission: to be completed 5 [place] Orientation: Patient has been oriented to the room, unit and staff.   Family: none at bedside  Orders to be reviewed and implemented. Will continue to monitor the patient. Call light has been placed within reach and bed alarm has been activated.

## 2020-12-31 NOTE — Progress Notes (Addendum)
PROGRESS NOTE  [name] WJX:[id number] DOB: 04-08-1930 DOA: 12/30/2020 PCP: Lucianne Lei, MD   LOS: 0 days   Brief narrative:  [name] is a 87 y.o. female with medical history significant of DM2, ESRD, HTN, MM, SSS bradycardia s/p PPM, atrial flutter presented to the hospital with shortness of breath.  Patient was recently seen by cardiology as outpatient and was scheduled for cardioversion next week due to shortness of breath which was thought to be secondary to atrial flutter.  She did have partial dialysis session and was brought into the hospital for evaluation of shortness of breath.  Covid test was negative.  Troponins were mildly elevated about baseline.  Chest x-ray showed bilateral pleural effusion.  Patient required 4 L of oxygen initially in the ED.  Patient was then admitted to hospital for further evaluation and treatment.  Assessment/Plan:  Principal Problem:   Acute respiratory failure with hypoxia (HCC) Active Problems:   Multiple myeloma not having achieved remission (HCC)   ESRD (end stage renal disease) on dialysis ([place])   Atypical atrial flutter (HCC)   Fluid overload  Acute resp failure with hypoxia - Likely secondary to decompensated heart failure in the setting of atypical atrial flutter.  Currently on 4 L of oxygen by nasal cannula.  Cardiology has been consulted.  Nephrology on board as well.  No signs of infection such as pneumonia.  Continue dialysis as per nephrology.  Persistent atypical atrial flutter.  Cardiology has been consulted.  Continue Eliquis.  There was plan for cardioversion as outpatient.  Diabetes mellitus type II. On glimepiride at home.  Hold OHA.  We will add sliding scale insulin..  Continue diabetic diet, Accu-Cheks.  ESRD on hemodialysis.- Hemodialysis as per nephrology.  Nephrology on board.  Multiple myeloma. Cont decadron, allopurinol.  Severe protein calorie malnutrition.  Present on admission.  Evidence for  significant weight loss, severe fat depletion, seen by dietitian.  Continue nutritional supplements.  DVT prophylaxis: apixaban (ELIQUIS) tablet 2.5 mg Start: 12/30/20 2200 apixaban (ELIQUIS) tablet 2.5 mg    Code Status: Full code  Family Communication: I spoke with the patient's son on the phone and updated him about the clinical condition of the patient.  Status is: Observation  The patient will require care spanning > 2 midnights and should be moved to inpatient because: IV treatments appropriate due to intensity of illness or inability to take PO, Inpatient level of care appropriate due to severity of illness and Possible need for cardiac intervention  Dispo: The patient is from: Home              Anticipated d/c is to: Home              Patient currently is not medically stable to d/c.     Difficult to place patient No   Consultants:  Nephrology,   cardiology  Procedures:  None yet  Anti-infectives:  Marland Kitchen Acyclovir  Anti-infectives (From admission, onward)   Start     Dose/Rate Route Frequency Ordered Stop   12/31/20 1000  acyclovir (ZOVIRAX) tablet 400 mg        400 mg Oral Daily 12/30/20 2125       Subjective:  Today, patient was seen and examined at bedside.  Patient denies any chest pain, dizziness, lightheadedness.  Shortness of breath has slightly improved.  Objective: Vitals:   12/31/20 0337 12/31/20 0955  BP: 110/62 (!) 108/56  Pulse: 70 79  Resp: 14 17  Temp: 98 F (  36.7 C) 98.3 F (36.8 C)  SpO2: 100% 100%    Intake/Output Summary (Last 24 hours) at 12/31/2020 1026 Last data filed at 12/31/2020 1021 Gross per 24 hour  Intake 420 ml  Output 0 ml  Net 420 ml   Filed Weights   12/30/20 2305  Weight: 46.4 kg   Body mass index is 17.02 kg/m.   Physical Exam: GENERAL: Patient is alert awake and communicative, not in obvious distress.  Thinly built, chronically ill, oriented HENT: No scleral pallor or icterus. Pupils equally reactive to  light. Oral mucosa is moist NECK: is supple, no gross swelling noted. CHEST:   Diminished breath sounds bilaterally.   CVS: S1 and S2 heard, no murmur.  Pacemaker in the chest wall. ABDOMEN: Soft, non-tender, bowel sounds are present. EXTREMITIES: No edema.  Right upper extremity AV fistula in place. CNS: Cranial nerves are intact. No focal motor deficits. SKIN: warm and dry without rashes.  Data Review: I have personally reviewed the following laboratory data and studies,  CBC: Recent Labs  Lab 12/30/20 1547 12/30/20 2303 12/31/20 0236  WBC 5.7 7.0 5.8  NEUTROABS 3.5  --   --   HGB 9.7* 8.1* 7.2*  HCT 28.6* 22.6* 20.7*  MCV 93.5 91.9 92.8  PLT 181 135* 096*   Basic Metabolic Panel: Recent Labs  Lab 12/30/20 1547 12/30/20 2303 12/31/20 0236  NA 136 135 135  K 3.3* 3.5 3.4*  CL 96* 97* 97*  CO2 30 30 31   GLUCOSE 80 95 86  BUN 9 11 12   CREATININE 1.72* 2.03* 2.10*  CALCIUM 9.1 8.3* 8.1*  MG 2.0  --   --   PHOS  --  2.6  --    Liver Function Tests: Recent Labs  Lab 12/30/20 1547 12/30/20 2303  AST 28  --   ALT 18  --   ALKPHOS 44  --   BILITOT 1.7*  --   PROT 6.0*  --   ALBUMIN 4.0 3.1*   No results for input(s): LIPASE, AMYLASE in the last 168 hours. No results for input(s): AMMONIA in the last 168 hours. Cardiac Enzymes: No results for input(s): CKTOTAL, CKMB, CKMBINDEX, TROPONINI in the last 168 hours. BNP (last 3 results) Recent Labs    04/02/20 0202  BNP 260.8*    ProBNP (last 3 results) No results for input(s): PROBNP in the last 8760 hours.  CBG: Recent Labs  Lab 12/30/20 1758 12/30/20 2305 12/31/20 0649 12/31/20 0904  GLUCAP 71 97 70 101*   Recent Results (from the past 240 hour(s))  Resp Panel by RT-PCR (Flu A&B, Covid) Nasopharyngeal Swab     Status: None   Collection Time: 12/30/20  3:28 PM   Specimen: Nasopharyngeal Swab; Nasopharyngeal(NP) swabs in vial transport medium  Result Value Ref Range Status   SARS Coronavirus 2 by RT  PCR NEGATIVE NEGATIVE Final    Comment: (NOTE) SARS-CoV-2 target nucleic acids are NOT DETECTED.  The SARS-CoV-2 RNA is generally detectable in upper respiratory specimens during the acute phase of infection. The lowest concentration of SARS-CoV-2 viral copies this assay can detect is 138 copies/mL. A negative result does not preclude SARS-Cov-2 infection and should not be used as the sole basis for treatment or other patient management decisions. A negative result may occur with  improper specimen collection/handling, submission of specimen other than nasopharyngeal swab, presence of viral mutation(s) within the areas targeted by this assay, and inadequate number of viral copies(<138 copies/mL). A negative result must be combined  with clinical observations, patient history, and epidemiological information. The expected result is Negative.  Fact Sheet for Patients:  EntrepreneurPulse.com.au  Fact Sheet for Healthcare Providers:  IncredibleEmployment.be  This test is no t yet approved or cleared by the [place] FDA and  has been authorized for detection and/or diagnosis of SARS-CoV-2 by FDA under an Emergency Use Authorization (EUA). This EUA will remain  in effect (meaning this test can be used) for the duration of the COVID-19 declaration under Section 564(b)(1) of the Act, 21 U.S.C.section 360bbb-3(b)(1), unless the authorization is terminated  or revoked sooner.       Influenza A by PCR NEGATIVE NEGATIVE Final   Influenza B by PCR NEGATIVE NEGATIVE Final    Comment: (NOTE) The Xpert Xpress SARS-CoV-2/FLU/RSV plus assay is intended as an aid in the diagnosis of influenza from Nasopharyngeal swab specimens and should not be used as a sole basis for treatment. Nasal washings and aspirates are unacceptable for Xpert Xpress SARS-CoV-2/FLU/RSV testing.  Fact Sheet for Patients: EntrepreneurPulse.com.au  Fact Sheet for  Healthcare Providers: IncredibleEmployment.be  This test is not yet approved or cleared by the [place] FDA and has been authorized for detection and/or diagnosis of SARS-CoV-2 by FDA under an Emergency Use Authorization (EUA). This EUA will remain in effect (meaning this test can be used) for the duration of the COVID-19 declaration under Section 564(b)(1) of the Act, 21 U.S.C. section 360bbb-3(b)(1), unless the authorization is terminated or revoked.  Performed at [facility], [place] [address] [place],  [address]      Studies: DG Chest Portable 1 View  Result Date: 12/30/2020 CLINICAL DATA:  Shortness of breath. EXAM: PORTABLE CHEST 1 VIEW COMPARISON:  September 11, 2020. FINDINGS: Stable cardiomediastinal silhouette. Left-sided pacemaker is unchanged in position. No pneumothorax is noted. Mild bibasilar atelectasis is noted with probable small pleural effusions. Bony thorax is unremarkable. IMPRESSION: Mild bibasilar subsegmental atelectasis with probable small pleural effusions. Aortic Atherosclerosis (ICD10-I70.0). Electronically Signed   By: Marijo Conception M.D.   On: 12/30/2020 15:56   CUP PACEART INCLINIC DEVICE CHECK  Result Date: 12/29/2020 Patient seen in device clinic today to attempt ANIPS. Was performed by Dr. Rayann Heman, patient initially was AFL and converted to AF. Would like patient to send manual transmission 12/30/20 to assess presenting. Patient will be scheduled cardioversion.Lavenia Atlas, BSN, RN     Flora Lipps, MD  [facility] 12/31/2020  If 7PM-7AM, please contact night-coverage

## 2020-12-31 NOTE — Progress Notes (Signed)
Initial Nutrition Assessment  DOCUMENTATION CODES:  Severe malnutrition in context of chronic illness  INTERVENTION:  Recommend liberalizing diet to regular to promote PO intake - spoke with MD.  Add Nepro Shake po BID, each supplement provides 425 kcal and 19 grams protein.  Recommend adding Rena-Vite daily.  NUTRITION DIAGNOSIS:  Severe Malnutrition related to chronic illness (ESRD on HD) as evidenced by percent weight loss,severe fat depletion,severe muscle depletion,per patient/family report.  GOAL:  Patient will meet greater than or equal to 90% of their needs  MONITOR:  PO intake,Supplement acceptance,Diet advancement,Weight trends,Labs  REASON FOR ASSESSMENT:  Malnutrition Screening Tool    ASSESSMENT:  87 yo female with a PMH of T2DM, ESRD on HD (TUTHSat), HTN, anemia, multiple myeloma, and GERD who presents with acute respiratory failure with hypoxia.  Spoke with pt at bedside. She reports feeling okay. She says her appetite is starting to come back finally, so she is eating more at home. She reports on dialysis days that she eats cereal/egg/bacon before going to dialysis and when she comes home she eats chicken noodle soup and some fruit. On days she doesn't have dialysis, she has the same breakfast, and does a snack of fruit in the middle of the day, and for dinner, has salad or vegetable with baked chicken. At night, she puts soup in a thermos and wakes up in the middle of the night sometimes to drink it because she wakes up hungry. At dialysis, she will sometimes drink 1 Nepro.  Her reported dry weight is 49 kg per Nephrology. She reports losing 5-6 pounds, but unsure over how long, estimates a few weeks. From her dry weight of 49 kg, she has lost 5 lbs (5%) of her body weight, which is significant.  Per Epic, not making any urine.  She is open to eating more protein and having Nepro (the supplement she likes) while in the hospital  Recommend liberalizing diet to  regular given age and good control of electrolytes and blood sugars. Also recommend Nepro BID to promote muscle and fat gain and Rena-Vite.   Relevant Medications: Vitamin D3 2000 units, dexamethasone, Reglan TID, MVI with minerals, midodrine, Senokot BID Labs: reviewed; K 3.4, corrected Ca 8.8 HbA1c: 4.7% (03/2020)  NUTRITION - FOCUSED PHYSICAL EXAM: Flowsheet Row Most Recent Value  Orbital Region Severe depletion  Upper Arm Region Severe depletion  Thoracic and Lumbar Region Severe depletion  Buccal Region Severe depletion  Temple Region Severe depletion  Clavicle Bone Region Severe depletion  Clavicle and Acromion Bone Region Severe depletion  Scapular Bone Region Severe depletion  Dorsal Hand Severe depletion  Patellar Region Severe depletion  Anterior Thigh Region Severe depletion  Posterior Calf Region Severe depletion  Edema (RD Assessment) None  Hair Reviewed  Eyes Reviewed  [pale conjunctiva]  Mouth Reviewed  Skin Reviewed  Nails Reviewed  [pale nail beds]     Diet Order:   Diet Order            Diet Carb Modified Fluid consistency: Thin; Room service appropriate? Yes  Diet effective now                EDUCATION NEEDS:  Education needs have been addressed  Skin:  Skin Assessment: Reviewed RN Assessment  Last BM:  12/28/20 - OBR in place  Height:  Ht Readings from Last 1 Encounters:  12/30/20 5' 5"  (1.651 m)   Weight:  Wt Readings from Last 1 Encounters:  12/30/20 46.4 kg   Ideal Body Weight:  56.8 kg  BMI:  Body mass index is 17.02 kg/m.  Estimated Nutritional Needs:  Kcal:  1600-1800 Protein:  85-100 grams Fluid:  UOP + 1000 ml  Derrel Nip, RD, LDN Registered Dietitian After Hours/Weekend Pager # in [place]

## 2020-12-31 NOTE — Progress Notes (Signed)
[name] Progress Note   Subjective:     [name] was seen and examined today at bedside. She reports feeling alittle SOB. She believes this may be due to anxiety. Currently on 4L O2. Denies CP, ABD pain, and N/V/D.   Objective Vitals:   12/30/20 2305 12/30/20 2308 12/31/20 0337 12/31/20 0955  BP:  116/66 110/62 (!) 108/56  Pulse:  79 70 79  Resp:  16 14 17   Temp:  98.5 F (36.9 C) 98 F (36.7 C) 98.3 F (36.8 C)  TempSrc:      SpO2:  100% 100% 100%  Weight: 46.4 kg     Height: 5\' 5"  (1.651 m)      Physical Exam General: Appears unwell; not in acute respiratory distress Heart: Normal S1 and S2; No murmurs, gallops, or friction rub Lungs: Crackles LLL; No wheezing or rhonchi; on 4L O2 Abdomen: Soft, non-tender, non-distended, active bowel sounds Extremities: 1+ edema bilateral hips and lower extremities Dialysis Access: R AVF  Filed Weights   12/30/20 2305  Weight: 46.4 kg    Intake/Output Summary (Last 24 hours) at 12/31/2020 1031 Last data filed at 12/31/2020 1021 Gross per 24 hour  Intake 420 ml  Output 0 ml  Net 420 ml    Additional Objective Labs: Basic Metabolic Panel: Recent Labs  Lab 12/30/20 1547 12/30/20 2303 12/31/20 0236  NA 136 135 135  K 3.3* 3.5 3.4*  CL 96* 97* 97*  CO2 30 30 31   GLUCOSE 80 95 86  BUN 9 11 12   CREATININE 1.72* 2.03* 2.10*  CALCIUM 9.1 8.3* 8.1*  PHOS  --  2.6  --    Liver Function Tests: Recent Labs  Lab 12/30/20 1547 12/30/20 2303  AST 28  --   ALT 18  --   ALKPHOS 44  --   BILITOT 1.7*  --   PROT 6.0*  --   ALBUMIN 4.0 3.1*   CBC: Recent Labs  Lab 12/30/20 1547 12/30/20 2303 12/31/20 0236  WBC 5.7 7.0 5.8  NEUTROABS 3.5  --   --   HGB 9.7* 8.1* 7.2*  HCT 28.6* 22.6* 20.7*  MCV 93.5 91.9 92.8  PLT 181 135* 128*   Blood Culture    Component Value Date/Time   SDES URINE, CLEAN CATCH 11/27/2019 1525   SPECREQUEST NONE 11/27/2019 1525   CULT  11/27/2019 1525    NO  GROWTH Performed at [facility], [place] [address], [place], [place] [address]    REPTSTATUS 11/28/2019 FINAL 11/27/2019 1525   CBG: Recent Labs  Lab 12/30/20 1758 12/30/20 2305 12/31/20 0649 12/31/20 0904  GLUCAP 71 97 70 101*   Iron Studies: No results for input(s): IRON, TIBC, TRANSFERRIN, FERRITIN in the last 72 hours. Lab Results  Component Value Date   INR 1.0 04/02/2020   INR 1.57 (H) 01/03/2010   INR 1.37 01/02/2010   Studies/Results: DG Chest Portable 1 View  Result Date: 12/30/2020 CLINICAL DATA:  Shortness of breath. EXAM: PORTABLE CHEST 1 VIEW COMPARISON:  September 11, 2020. FINDINGS: Stable cardiomediastinal silhouette. Left-sided pacemaker is unchanged in position. No pneumothorax is noted. Mild bibasilar atelectasis is noted with probable small pleural effusions. Bony thorax is unremarkable. IMPRESSION: Mild bibasilar subsegmental atelectasis with probable small pleural effusions. Aortic Atherosclerosis (ICD10-I70.0). Electronically Signed   By: Marijo Conception M.D.   On: 12/30/2020 15:56   CUP PACEART INCLINIC DEVICE CHECK  Result Date: 12/29/2020 Patient seen in device clinic today to attempt ANIPS. Was  performed by Dr. Rayann Heman, patient initially was AFL and converted to AF. Would like patient to send manual transmission 12/30/20 to assess presenting. Patient will be scheduled cardioversion.Lavenia Atlas, BSN, RN   Medications: . sodium chloride    . sodium chloride     . acyclovir  400 mg Oral Daily  . allopurinol  50 mg Oral Daily  . apixaban  2.5 mg Oral BID  . Chlorhexidine Gluconate Cloth  6 each Topical Q0600  . cholecalciferol  2,000 Units Oral Daily  . dexamethasone  2 mg Oral Daily  . dorzolamide  1 drop Left Eye BID  . fluorometholone  1 drop Right Eye Daily  . glimepiride  0.5 mg Oral Daily  . lactulose  10 g Oral TID  . latanoprost  1 drop Left Eye QHS  . metoCLOPramide  5 mg Oral TID AC  . [START ON 01/01/2021] midodrine  10 mg Oral Q  T,Th,Sat-1800  . multivitamin with minerals  1 tablet Oral Daily  . polyethylene glycol  17 g Oral Daily  . pregabalin  50 mg Oral QHS  . senna-docusate  2 tablet Oral BID    Dialysis Orders: Outpatient rx: Unit: [facility]. TTS. 4hrs, 3K, 2.5Ca, Na 137, Bicarbonate 35, 400/500. Dialyzer: F180, heparin 2k bolus  Assessment/Plan: 1. Acute Respiratory Failure: Possibly r/t A flutter. Seen by Cardiology as outpatient. Patient is scheduled for Cardioversion on 01/05/21. 2. ESRD - HD TTS: Received partial HD tx on 3/17. Patient scheduled for HD today 12/31/20, 4hrs, UF as tolerated. Goal for fluid removal to improve respiratory status. Will also schedule patient for shorter treatment tomorrow 01/01/21 to return to regular HD schedule.  3. Anemia of CKD-  Hgb trending downward; now at 7.2. Last Tsat 41 (12/23/20 OP HD). Will start Aranesp 36mcg weekly to start today. Will also re-assess iron studies. Depending on results, may have to give Fe load. 4. Secondary hyperparathyroidism - Ca at 8.1. Will resume Hectorol 37mcg twice weekly (last dose 12/30/20 OP HD). Will continue to monitor trends. May have to increase frequency if needed.  5. HTN/volume - Appears volume overloaded. Noted fine crackles LLL and 1+ edema bilateral hips and lower extremities. Blood pressures seem borderline soft but stable. Scheduled for HD treatment today to remove fluid. Also will schedule for shorter tomorrow to get her back on regular schedule. Will continue to assess volume status to determine appropriate UFG 6. Nutrition - Recommend renal diet, will add Prosourse protein supplement.  [name], NP [facility] 12/31/2020,10:31 AM  LOS: 0 days

## 2020-12-31 NOTE — Discharge Instructions (Signed)

## 2021-01-01 ENCOUNTER — Encounter (HOSPITAL_COMMUNITY): Payer: Self-pay | Admitting: Internal Medicine

## 2021-01-01 ENCOUNTER — Inpatient Hospital Stay (HOSPITAL_COMMUNITY): Payer: Medicare PPO

## 2021-01-01 ENCOUNTER — Other Ambulatory Visit (HOSPITAL_COMMUNITY): Payer: Medicare PPO

## 2021-01-01 DIAGNOSIS — I10 Essential (primary) hypertension: Secondary | ICD-10-CM | POA: Diagnosis not present

## 2021-01-01 DIAGNOSIS — E8770 Fluid overload, unspecified: Secondary | ICD-10-CM

## 2021-01-01 DIAGNOSIS — N186 End stage renal disease: Secondary | ICD-10-CM | POA: Diagnosis not present

## 2021-01-01 DIAGNOSIS — I5030 Unspecified diastolic (congestive) heart failure: Secondary | ICD-10-CM

## 2021-01-01 DIAGNOSIS — I484 Atypical atrial flutter: Secondary | ICD-10-CM | POA: Diagnosis not present

## 2021-01-01 DIAGNOSIS — E43 Unspecified severe protein-calorie malnutrition: Secondary | ICD-10-CM

## 2021-01-01 DIAGNOSIS — J9601 Acute respiratory failure with hypoxia: Secondary | ICD-10-CM | POA: Diagnosis not present

## 2021-01-01 LAB — CBC WITH DIFFERENTIAL/PLATELET
Abs Immature Granulocytes: 0.02 10*3/uL (ref 0.00–0.07)
Basophils Absolute: 0 10*3/uL (ref 0.0–0.1)
Basophils Relative: 0 %
Eosinophils Absolute: 0 10*3/uL (ref 0.0–0.5)
Eosinophils Relative: 0 %
HCT: 25.5 % — ABNORMAL LOW (ref 36.0–46.0)
Hemoglobin: 8.8 g/dL — ABNORMAL LOW (ref 12.0–15.0)
Immature Granulocytes: 0 %
Lymphocytes Relative: 19 %
Lymphs Abs: 1.1 10*3/uL (ref 0.7–4.0)
MCH: 32.5 pg (ref 26.0–34.0)
MCHC: 34.5 g/dL (ref 30.0–36.0)
MCV: 94.1 fL (ref 80.0–100.0)
Monocytes Absolute: 0.4 10*3/uL (ref 0.1–1.0)
Monocytes Relative: 6 %
Neutro Abs: 4.3 10*3/uL (ref 1.7–7.7)
Neutrophils Relative %: 75 %
Platelets: 122 10*3/uL — ABNORMAL LOW (ref 150–400)
RBC: 2.71 MIL/uL — ABNORMAL LOW (ref 3.87–5.11)
RDW: 17.6 % — ABNORMAL HIGH (ref 11.5–15.5)
WBC: 5.7 10*3/uL (ref 4.0–10.5)
nRBC: 0 % (ref 0.0–0.2)

## 2021-01-01 LAB — GLUCOSE, CAPILLARY
Glucose-Capillary: 134 mg/dL — ABNORMAL HIGH (ref 70–99)
Glucose-Capillary: 174 mg/dL — ABNORMAL HIGH (ref 70–99)
Glucose-Capillary: 114 mg/dL — ABNORMAL HIGH (ref 70–99)
Glucose-Capillary: 111 mg/dL — ABNORMAL HIGH (ref 70–99)

## 2021-01-01 LAB — BASIC METABOLIC PANEL
Anion gap: 7 (ref 5–15)
BUN: 8 mg/dL (ref 8–23)
CO2: 30 mmol/L (ref 22–32)
Calcium: 8.4 mg/dL — ABNORMAL LOW (ref 8.9–10.3)
Chloride: 97 mmol/L — ABNORMAL LOW (ref 98–111)
Creatinine, Ser: 1.98 mg/dL — ABNORMAL HIGH (ref 0.44–1.00)
GFR, Estimated: 24 mL/min — ABNORMAL LOW (ref >60)
Glucose, Bld: 122 mg/dL — ABNORMAL HIGH (ref 70–99)
Potassium: 4 mmol/L (ref 3.5–5.1)
Sodium: 134 mmol/L — ABNORMAL LOW (ref 135–145)

## 2021-01-01 MED ORDER — ALTEPLASE 2 MG IJ SOLR: 2.0000 mg | Freq: Once | INTRAMUSCULAR | Status: DC | PRN

## 2021-01-01 MED ORDER — HEPARIN SODIUM (PORCINE) 1000 UNIT/ML DIALYSIS: 1000.0000 [IU] | INTRAMUSCULAR | Status: DC | PRN

## 2021-01-01 MED ORDER — LIDOCAINE HCL (PF) 1 % IJ SOLN: 5.0000 mL | INTRAMUSCULAR | Status: DC | PRN

## 2021-01-01 MED ORDER — PENTAFLUOROPROP-TETRAFLUOROETH EX AERO: 1.0000 "application " | INHALATION_SPRAY | CUTANEOUS | Status: DC | PRN

## 2021-01-01 MED ORDER — SODIUM CHLORIDE 0.9 % IV SOLN: 100.0000 mL | INTRAVENOUS | Status: DC | PRN

## 2021-01-01 MED ORDER — SODIUM CHLORIDE 0.9 % IV SOLN
100.0000 mL | INTRAVENOUS | Status: DC | PRN
Start: 2021-01-01 — End: 2021-01-02

## 2021-01-01 NOTE — Progress Notes (Signed)
[facility] Progress Note   Subjective:    [name] was seen and examined today at bedside. Appears to be doing better. She reports her breathing has improved. Noted patient weaned down to RA. Denies CP, ABD pain, N/V/D. UF only 52ml during yesterday's HD treatment d/t soft blood pressures. HD treatment time decreased to 3hrs. Blood pressures noted on HD in sys 90s. Patient on Midodrine 10mg  TTS (HD days). Plan is for HD treatment again today to return to patient's regular schedule.  Objective Vitals:   12/31/20 1626 12/31/20 1728 12/31/20 2013 01/01/21 0327  BP: 110/60 (!) 107/56 (!) 100/51 (!) 104/57  Pulse: 80 95 78 75  Resp:  18 18 18   Temp:  98 F (36.7 C) 97.9 F (36.6 C) 98 F (36.7 C)  TempSrc:   Oral Oral  SpO2: 100% 98% 100% 97%  Weight:      Height:       Physical Exam General: Appears comfortable, more energy, not in acute respiratory distress Heart: Normal S1 and S2; No murmurs, gallops, or friction rub Lungs: Improving, no rales, wheezing or rhonchi; now on RA Abdomen: Soft, non-tender, non-distended, active bowel sounds Extremities: Improving, now 1+ edema bilateral lower extremities Dialysis Access: R AVF  Filed Weights   12/30/20 2305 12/31/20 1328  Weight: 46.4 kg 47 kg    Intake/Output Summary (Last 24 hours) at 01/01/2021 0928 Last data filed at 01/01/2021 0600 Gross per 24 hour  Intake 540 ml  Output 0 ml  Net 540 ml    Additional Objective Labs: Basic Metabolic Panel: Recent Labs  Lab 12/30/20 1547 12/30/20 2303 12/31/20 0236  NA 136 135 135  K 3.3* 3.5 3.4*  CL 96* 97* 97*  CO2 30 30 31   GLUCOSE 80 95 86  BUN 9 11 12   CREATININE 1.72* 2.03* 2.10*  CALCIUM 9.1 8.3* 8.1*  PHOS  --  2.6  --    Liver Function Tests: Recent Labs  Lab 12/30/20 1547 12/30/20 2303  AST 28  --   ALT 18  --   ALKPHOS 44  --   BILITOT 1.7*  --   PROT 6.0*  --   ALBUMIN 4.0 3.1*   CBC: Recent Labs  Lab 12/30/20 1547 12/30/20 2303  12/31/20 0236  WBC 5.7 7.0 5.8  NEUTROABS 3.5  --   --   HGB 9.7* 8.1* 7.2*  HCT 28.6* 22.6* 20.7*  MCV 93.5 91.9 92.8  PLT 181 135* 128*   Blood Culture    Component Value Date/Time   SDES URINE, CLEAN CATCH 11/27/2019 1525   SPECREQUEST NONE 11/27/2019 1525   CULT  11/27/2019 1525    NO GROWTH Performed at [facility], [place] [address] [place], [place] [address]    REPTSTATUS 11/28/2019 FINAL 11/27/2019 1525    CBG: Recent Labs  Lab 12/31/20 0904 12/31/20 1210 12/31/20 1729 12/31/20 2138 01/01/21 0717  GLUCAP 101* 96 80 102* 134*   Iron Studies:  Recent Labs    12/31/20 1217  IRON 63  TIBC 171*  FERRITIN 1,226*   Lab Results  Component Value Date   INR 1.0 04/02/2020   INR 1.57 (H) 01/03/2010   INR 1.37 01/02/2010   Studies/Results: DG Chest Portable 1 View  Result Date: 12/30/2020 CLINICAL DATA:  Shortness of breath. EXAM: PORTABLE CHEST 1 VIEW COMPARISON:  September 11, 2020. FINDINGS: Stable cardiomediastinal silhouette. Left-sided pacemaker is unchanged in position. No pneumothorax is noted. Mild bibasilar atelectasis is noted with probable  small pleural effusions. Bony thorax is unremarkable. IMPRESSION: Mild bibasilar subsegmental atelectasis with probable small pleural effusions. Aortic Atherosclerosis (ICD10-I70.0). Electronically Signed   By: Marijo Conception M.D.   On: 12/30/2020 15:56    Medications:  . (feeding supplement) PROSource Plus  30 mL Oral TID BM  . acyclovir  400 mg Oral Daily  . allopurinol  50 mg Oral Daily  . apixaban  2.5 mg Oral BID  . Chlorhexidine Gluconate Cloth  6 each Topical Q0600  . cholecalciferol  2,000 Units Oral Daily  . darbepoetin (ARANESP) injection - DIALYSIS  25 mcg Intravenous Q Fri-HD  . dexamethasone  2 mg Oral Daily  . dorzolamide  1 drop Left Eye BID  . feeding supplement (NEPRO CARB STEADY)  237 mL Oral BID BM  . fluorometholone  1 drop Right Eye Daily  . insulin aspart  0-6 Units Subcutaneous TID  WC  . lactulose  10 g Oral TID  . latanoprost  1 drop Left Eye QHS  . metoCLOPramide  5 mg Oral TID AC  . midodrine  10 mg Oral Q T,Th,Sat-1800  . multivitamin  1 tablet Oral QHS  . polyethylene glycol  17 g Oral Daily  . pregabalin  50 mg Oral QHS  . senna-docusate  2 tablet Oral BID    Dialysis Orders: Outpatient rx: Unit: [facility]. TTS. [place], [id number], Bicarbonate35,400/500.Dialyzer:F180,heparin 2k bolus  Assessment/Plan: 1. Acute Respiratory Failure: Possibly r/t A flutter. Seen by Cardiology as outpatient. Patient is scheduled for Cardioversion on 01/05/21. Per last Cardiology note, plan is for TTE. 2. ESRD - HD TTS: Noted soft blood pressures while receiving extra HD treatment yesterday. HD time decreased to 3hrs. Patient on Midodrine 10mg  with HD treatments (TTS). Will schedule for HD today 01/01/21 to return back to her regular schedule. Will advise to give Midodrine before treatment. Will also place an extra Midodrine order to give during HD if blood pressure drop below 100.  3. Anemia of CKD-  Hgb trending downward; now at 7.2. Tsat now 37 and ferritin 1226. Will start Aranesp 34mcg weekly to start today. Will order CBC before treatment today. Plan to give ferric gluconate X 3 doses. 4. Secondary hyperparathyroidism - Ca at 8.1. Will resume Hectorol 101mcg twice weekly (last dose 12/30/20 OP HD). Will order RFP pre-HD today. May have to increase frequency, depending on results. 5. HTN/volume - Patient's volume status is improving. No rales upon exam, weaned down to RA. Edema improving now at 1+ lower extremities.  Blood pressures seem borderline soft but stable. Will schedule for shorter treatment today to get her back on regular schedule. Will continue to assess volume status to determine appropriate UFG 6. Nutrition - Recommend renal diet, will add Prosourse protein supplement.  [name], NP [facility] 01/01/2021,9:28 AM  LOS: 1 day

## 2021-01-01 NOTE — Progress Notes (Signed)
PROGRESS NOTE    [name]  VHQ:[id number] DOB: 08-Mar-1930 DOA: 12/30/2020 PCP: Lucianne Lei, MD   Brief Narrative:  [name] a 87 y.o.femalewith medical history significant ofDM2, ESRD, HTN, MM, SSS bradycardia s/p PPM, atrial flutter presented to the hospital with shortness of breath.  Patient was recently seen by cardiology as outpatient and was scheduled for cardioversion next week due to shortness of breath which was thought to be secondary to atrial flutter.  She did have partial dialysis session and was brought into the hospital for evaluation of shortness of breath.  Covid test was negative.  Troponins were mildly elevated about baseline.  Chest x-ray showed bilateral pleural effusion.  Patient required 4 L of oxygen initially in the ED.  Patient was then admitted to hospital for further evaluation and treatment   Assessment & Plan:   Principal Problem:   Acute respiratory failure with hypoxia ([place]) Active Problems:   Multiple myeloma not having achieved remission ([place])   ESRD (end stage renal disease) on dialysis ([place])   Atypical atrial flutter (HCC)   Fluid overload   Protein-calorie malnutrition, severe   Atrial flutter (H[facility])   Acute resp failure with hypoxia - Likely secondary to decompensated heart failure in the setting of atypical atrial flutter. Weaned off 4 L of oxygen by nasal cannula.  Cardiology has been consulted.  Nephrology on board as well.  No signs of infection such as pneumonia.  Continue dialysis as per nephrology.  Persistent atypical atrial flutter.  Cardiology has been consulted.  Continue Eliquis.  There was plan for cardioversion as outpatient. Cardiology evaluating for poss DCCV on monday  Diabetes mellitus type II. On glimepiride at home.  Hold OHA.  We will add sliding scale insulin..  Continue diabetic diet, Accu-Cheks.  ESRD on hemodialysis.- Hemodialysis as per nephrology.  Nephrology on board.  Multiple myeloma. Cont  decadron, allopurinol.  Severe protein calorie malnutrition.  Present on admission.  Evidence for significant weight loss, severe fat depletion, seen by dietitian.  Continue nutritional supplements.  DVT prophylaxis: apixaban (ELIQUIS) tablet 2.5 mg Start: 12/30/20 2200 apixaban (ELIQUIS) tablet 2.5 mg   DVT prophylaxis: UX:[id number]  As above Code Status: full    Code Status Orders  (From admission, onward)         Start     Ordered   12/30/20 2115  Full code  Continuous        12/30/20 2118        Code Status History    Date Active Date Inactive Code Status Order ID Comments User Context   09/11/2020 0724 09/12/2020 1909 Full Code [id number]  Edwin Dada, MD Inpatient   04/02/2020 0924 04/04/2020 2122 Full Code [id number]  Vianne Bulls, MD ED   05/25/2019 2131 05/26/2019 2030 Full Code [id number]  Bonnell Public, MD ED   02/25/2019 1358 03/11/2019 2007 Full Code [id number]  Georgette Shell, MD ED   02/25/2019 1300 02/25/2019 1358 Full Code [id number]  Georgette Shell, MD ED   05/16/2016 1510 05/16/2016 2008 Full Code [id number]  Thompson Grayer, MD Inpatient   03/09/2015 2011 03/10/2015 2036 Full Code [id number]  Mendel Corning, MD Inpatient   Advance Care Planning Activity    Advance Directive Documentation   Flowsheet Row Most Recent Value  Type of Advance Directive Healthcare Power of Attorney  Pre-existing out of facility DNR order (yellow form or pink MOST form) --  "MOST" Form in Place? --     Family  Communication: son at bedside  Disposition Plan:    Status is: Inpatient  Remains inpatient appropriate because:, IV treatments appropriate due to intensity of illness or inability to take PO and Inpatient level of care appropriate due to severity of illness, and prob DCCV   monday  Dispo: Patient From: Home Planned Disposition: Home with [facility] medically stable for discharge: no.  Consults called:  None Admission status: Inpatient   Consultants:   cards  Procedures:  DG Chest Portable 1 View  Result Date: 12/30/2020 CLINICAL DATA:  Shortness of breath. EXAM: PORTABLE CHEST 1 VIEW COMPARISON:  September 11, 2020. FINDINGS: Stable cardiomediastinal silhouette. Left-sided pacemaker is unchanged in position. No pneumothorax is noted. Mild bibasilar atelectasis is noted with probable small pleural effusions. Bony thorax is unremarkable. IMPRESSION: Mild bibasilar subsegmental atelectasis with probable small pleural effusions. Aortic Atherosclerosis (ICD10-I70.0). Electronically Signed   By: Marijo Conception M.D.   On: 12/30/2020 15:56   CUP PACEART INCLINIC DEVICE CHECK  Result Date: 12/29/2020 Patient seen in device clinic today to attempt ANIPS. Was performed by Dr. Rayann Heman, patient initially was AFL and converted to AF. Would like patient to send manual transmission 12/30/20 to assess presenting. Patient will be scheduled cardioversion.Lavenia Atlas, BSN, RN    Antimicrobials:   acyclovir    Subjective: Resting comfortably in bed, no complaints, son at bedside  Objective: Vitals:   12/31/20 1728 12/31/20 2013 01/01/21 0327 01/01/21 0959  BP: (!) 107/56 (!) 100/51 (!) 104/57 (!) 104/55  Pulse: 95 78 75 75  Resp: _0 Temp: 98 F (36.7 C) 97.9 F (36.6 C) 98 F (36.7 C) 98 F (36.7 C)  TempSrc:  Oral Oral Oral  SpO2: 98% 100% 97% 100%  Weight:      Height:        Intake/Output Summary (Last 24 hours) at 01/01/2021 1231 Last data filed at 01/01/2021 0900 Gross per 24 hour  Intake 780 ml  Output 0 ml  Net 780 ml   Filed Weights   12/30/20 2305 12/31/20 1328  Weight: 46.4 kg 47 kg    Examination:  General exam: Appears calm and comfortable  Respiratory system: Clear to auscultation. Respiratory effort normal. Cardiovascular system: S1 & S2 heard, RRR. No JVD, murmurs, rubs, gallops or clicks. No pedal edema. Gastrointestinal system: Abdomen is  nondistended, soft and nontender. No organomegaly or masses felt. Normal bowel sounds heard. Central nervous system: Alert and oriented. No focal neurological deficits. Extremities: WWP trace edema Skin: No rashes, lesions or ulcers Psychiatry: Judgement and insight appear normal. Mood & affect appropriate.     Data Reviewed: I have personally reviewed following labs and imaging studies  CBC: Recent Labs  Lab 12/30/20 1547 12/30/20 2303 12/31/20 0236 01/01/21 0934  WBC 5.7 7.0 5.8 5.7  NEUTROABS 3.5  --   --  4.3  HGB 9.7* 8.1* 7.2* 8.8*  HCT 28.6* 22.6* 20.7* 25.5*  MCV 93.5 91.9 92.8 94.1  PLT 181 135* 128* 710*   Basic Metabolic Panel: Recent Labs  Lab 12/30/20 1547 12/30/20 2303 12/31/20 0236 01/01/21 0934  NA 136 135 135 134*  K 3.3* 3.5 3.4* 4.0  CL 96* 97* 97* 97*  CO2 _1 GLUCOSE 80 95 86 122*  BUN _2 CREATININE 1.72* 2.03* 2.10* 1.98*  CALCIUM 9.1 8.3* 8.1* 8.4*  MG 2.0  --   --   --   PHOS  --  2.6  --   --  GFR: Estimated Creatinine Clearance: 14.6 mL/min (A) (by C-G formula based on SCr of 1.98 mg/dL (H)). Liver Function Tests: Recent Labs  Lab 12/30/20 1547 12/30/20 2303  AST 28  --   ALT 18  --   ALKPHOS 44  --   BILITOT 1.7*  --   PROT 6.0*  --   ALBUMIN 4.0 3.1*   No results for input(s): LIPASE, AMYLASE in the last 168 hours. No results for input(s): AMMONIA in the last 168 hours. Coagulation Profile: No results for input(s): INR, PROTIME in the last 168 hours. Cardiac Enzymes: No results for input(s): CKTOTAL, CKMB, CKMBINDEX, TROPONINI in the last 168 hours. BNP (last 3 results) No results for input(s): PROBNP in the last 8760 hours. HbA1C: Recent Labs    12/31/20 0236  HGBA1C 4.6*   CBG: Recent Labs  Lab 12/31/20 1210 12/31/20 1729 12/31/20 2138 01/01/21 0717 01/01/21 1134  GLUCAP 96 80 102* 134* 174*   Lipid Profile: No results for input(s): CHOL, HDL, LDLCALC, TRIG, CHOLHDL, LDLDIRECT in the last  72 hours. Thyroid Function Tests: Recent Labs    12/30/20 1547  TSH 2.272   Anemia Panel: Recent Labs    12/31/20 1217  FERRITIN 1,226*  TIBC 171*  IRON 63   Sepsis Labs: Recent Labs  Lab 12/30/20 1545 12/30/20 1740  LATICACIDVEN 2.0* 1.4    Recent Results (from the past 240 hour(s))  Resp Panel by RT-PCR (Flu A&B, Covid) Nasopharyngeal Swab     Status: None   Collection Time: 12/30/20  3:28 PM   Specimen: Nasopharyngeal Swab; Nasopharyngeal(NP) swabs in vial transport medium  Result Value Ref Range Status   SARS Coronavirus 2 by RT PCR NEGATIVE NEGATIVE Final    Comment: (NOTE) SARS-CoV-2 target nucleic acids are NOT DETECTED.  The SARS-CoV-2 RNA is generally detectable in upper respiratory specimens during the acute phase of infection. The lowest concentration of SARS-CoV-2 viral copies this assay can detect is 138 copies/mL. A negative result does not preclude SARS-Cov-2 infection and should not be used as the sole basis for treatment or other patient management decisions. A negative result may occur with  improper specimen collection/handling, submission of specimen other than nasopharyngeal swab, presence of viral mutation(s) within the areas targeted by this assay, and inadequate number of viral copies(<138 copies/mL). A negative result must be combined with clinical observations, patient history, and epidemiological information. The expected result is Negative.  Fact Sheet for Patients:  EntrepreneurPulse.com.au  Fact Sheet for Healthcare Providers:  IncredibleEmployment.be  This test is no t yet approved or cleared by the [place] FDA and  has been authorized for detection and/or diagnosis of SARS-CoV-2 by FDA under an Emergency Use Authorization (EUA). This EUA will remain  in effect (meaning this test can be used) for the duration of the COVID-19 declaration under Section 564(b)(1) of the Act, 21 U.S.C.section  360bbb-3(b)(1), unless the authorization is terminated  or revoked sooner.       Influenza A by PCR NEGATIVE NEGATIVE Final   Influenza B by PCR NEGATIVE NEGATIVE Final    Comment: (NOTE) The Xpert Xpress SARS-CoV-2/FLU/RSV plus assay is intended as an aid in the diagnosis of influenza from Nasopharyngeal swab specimens and should not be used as a sole basis for treatment. Nasal washings and aspirates are unacceptable for Xpert Xpress SARS-CoV-2/FLU/RSV testing.  Fact Sheet for Patients: EntrepreneurPulse.com.au  Fact Sheet for Healthcare Providers: IncredibleEmployment.be  This test is not yet approved or cleared by the [place] FDA and has  been authorized for detection and/or diagnosis of SARS-CoV-2 by FDA under an Emergency Use Authorization (EUA). This EUA will remain in effect (meaning this test can be used) for the duration of the COVID-19 declaration under Section 564(b)(1) of the Act, 21 U.S.C. section 360bbb-3(b)(1), unless the authorization is terminated or revoked.  Performed at [facility], [place] [address], [place], [place] [address]   MRSA PCR Screening     Status: None   Collection Time: 12/31/20  6:53 AM   Specimen: Nasopharyngeal  Result Value Ref Range Status   MRSA by PCR NEGATIVE NEGATIVE Final    Comment:        The GeneXpert MRSA Assay (FDA approved for NASAL specimens only), is one component of a comprehensive MRSA colonization surveillance program. It is not intended to diagnose MRSA infection nor to guide or monitor treatment for MRSA infections. Performed at [facility], [place] [address] [place], [place] [address]          Radiology Studies: DG Chest Portable 1 View  Result Date: 12/30/2020 CLINICAL DATA:  Shortness of breath. EXAM: PORTABLE CHEST 1 VIEW COMPARISON:  September 11, 2020. FINDINGS: Stable cardiomediastinal silhouette. Left-sided pacemaker is unchanged in position. No  pneumothorax is noted. Mild bibasilar atelectasis is noted with probable small pleural effusions. Bony thorax is unremarkable. IMPRESSION: Mild bibasilar subsegmental atelectasis with probable small pleural effusions. Aortic Atherosclerosis (ICD10-I70.0). Electronically Signed   By: Marijo Conception M.D.   On: 12/30/2020 15:56        Scheduled Meds: . (feeding supplement) PROSource Plus  30 mL Oral TID BM  . acyclovir  400 mg Oral Daily  . allopurinol  50 mg Oral Daily  . apixaban  2.5 mg Oral BID  . Chlorhexidine Gluconate Cloth  6 each Topical Q0600  . cholecalciferol  2,000 Units Oral Daily  . darbepoetin (ARANESP) injection - DIALYSIS  25 mcg Intravenous Q Fri-HD  . dexamethasone  2 mg Oral Daily  . dorzolamide  1 drop Left Eye BID  . feeding supplement (NEPRO CARB STEADY)  237 mL Oral BID BM  . fluorometholone  1 drop Right Eye Daily  . insulin aspart  0-6 Units Subcutaneous TID WC  . lactulose  10 g Oral TID  . latanoprost  1 drop Left Eye QHS  . metoCLOPramide  5 mg Oral TID AC  . midodrine  10 mg Oral Q T,Th,Sat-1800  . multivitamin  1 tablet Oral QHS  . polyethylene glycol  17 g Oral Daily  . pregabalin  50 mg Oral QHS  . senna-docusate  2 tablet Oral BID   Continuous Infusions: . sodium chloride    . sodium chloride       LOS: 1 day    Time spent: 35 min    Nicolette Bang, MD [facility]  If 7PM-7AM, please contact night-coverage  01/01/2021, 12:31 PM

## 2021-01-01 NOTE — Progress Notes (Signed)
Progress Note  Patient Name: [name] Date of Encounter: 01/01/2021  [facility] Cardiologist: Jenkins Rouge, MD   Subjective   Denies any chest pain or SOB. HR controlled  Inpatient Medications    Scheduled Meds: . (feeding supplement) PROSource Plus  30 mL Oral TID BM  . acyclovir  400 mg Oral Daily  . allopurinol  50 mg Oral Daily  . apixaban  2.5 mg Oral BID  . Chlorhexidine Gluconate Cloth  6 each Topical Q0600  . cholecalciferol  2,000 Units Oral Daily  . darbepoetin (ARANESP) injection - DIALYSIS  25 mcg Intravenous Q Fri-HD  . dexamethasone  2 mg Oral Daily  . dorzolamide  1 drop Left Eye BID  . feeding supplement (NEPRO CARB STEADY)  237 mL Oral BID BM  . fluorometholone  1 drop Right Eye Daily  . insulin aspart  0-6 Units Subcutaneous TID WC  . lactulose  10 g Oral TID  . latanoprost  1 drop Left Eye QHS  . metoCLOPramide  5 mg Oral TID AC  . midodrine  10 mg Oral Q T,Th,Sat-1800  . multivitamin  1 tablet Oral QHS  . polyethylene glycol  17 g Oral Daily  . pregabalin  50 mg Oral QHS  . senna-docusate  2 tablet Oral BID   Continuous Infusions: . sodium chloride    . sodium chloride     PRN Meds: sodium chloride, sodium chloride, acetaminophen **OR** acetaminophen, alteplase, heparin, lidocaine (PF), lidocaine-prilocaine, ondansetron **OR** ondansetron (ZOFRAN) IV, oxyCODONE, pantoprazole, pentafluoroprop-tetrafluoroeth   Vital Signs    Vitals:   12/31/20 1728 12/31/20 2013 01/01/21 0327 01/01/21 0959  BP: (!) 107/56 (!) 100/51 (!) 104/57 (!) 104/55  Pulse: 95 78 75 75  Resp: 18 18 18 18   Temp: 98 F (36.7 C) 97.9 F (36.6 C) 98 F (36.7 C) 98 F (36.7 C)  TempSrc:  Oral Oral Oral  SpO2: 98% 100% 97% 100%  Weight:      Height:        Intake/Output Summary (Last 24 hours) at 01/01/2021 1157 Last data filed at 01/01/2021 0900 Gross per 24 hour  Intake 780 ml  Output 0 ml  Net 780 ml   Last 3 Weights 12/31/2020 12/30/2020 12/29/2020   Weight (lbs) 103 lb 9.9 oz 102 lb 4.7 oz 105 lb  Weight (kg) 47 kg 46.4 kg 47.628 kg      Telemetry    NSR with PACs - Personally Reviewed  ECG    No new EKG to revievw - Personally Reviewed  Physical Exam   GEN: No acute distress.   Neck: No JVD Cardiac: RRR, no murmurs, rubs, or gallops.  Respiratory: Clear to auscultation bilaterally. GI: Soft, nontender, non-distended  MS: No edema; No deformity. Neuro:  Nonfocal  Psych: Normal affect   Labs    High Sensitivity Troponin:   Recent Labs  Lab 12/30/20 1547 12/30/20 1740  TROPONINIHS 144* 123*      Chemistry Recent Labs  Lab 12/30/20 1547 12/30/20 2303 12/31/20 0236 01/01/21 0934  NA 136 135 135 134*  K 3.3* 3.5 3.4* 4.0  CL 96* 97* 97* 97*  CO2 30 30 31 30   GLUCOSE 80 95 86 122*  BUN 9 11 12 8   CREATININE 1.72* 2.03* 2.10* 1.98*  CALCIUM 9.1 8.3* 8.1* 8.4*  PROT 6.0*  --   --   --   ALBUMIN 4.0 3.1*  --   --   AST 28  --   --   --  ALT 18  --   --   --   ALKPHOS 44  --   --   --   BILITOT 1.7*  --   --   --   GFRNONAA 28* 23* 22* 24*  ANIONGAP 10 8 7 7      Hematology Recent Labs  Lab 12/30/20 2303 12/31/20 0236 01/01/21 0934  WBC 7.0 5.8 5.7  RBC 2.46* 2.23* 2.71*  HGB 8.1* 7.2* 8.8*  HCT 22.6* 20.7* 25.5*  MCV 91.9 92.8 94.1  MCH 32.9 32.3 32.5  MCHC 35.8 34.8 34.5  RDW 17.4* 17.4* 17.6*  PLT 135* 128* 122*    BNPNo results for input(s): BNP, PROBNP in the last 168 hours.   DDimer No results for input(s): DDIMER in the last 168 hours.   CHA2DS2-VASc Score = 6 This indicates a 9.7% annual risk of stroke. The patient's score is based upon: CHF History: Yes HTN History: Yes Diabetes History: Yes Stroke History: No Vascular Disease History: No Age Score: 2 Gender Score: 1      Radiology    DG Chest Portable 1 View  Result Date: 12/30/2020 CLINICAL DATA:  Shortness of breath. EXAM: PORTABLE CHEST 1 VIEW COMPARISON:  September 11, 2020. FINDINGS: Stable cardiomediastinal  silhouette. Left-sided pacemaker is unchanged in position. No pneumothorax is noted. Mild bibasilar atelectasis is noted with probable small pleural effusions. Bony thorax is unremarkable. IMPRESSION: Mild bibasilar subsegmental atelectasis with probable small pleural effusions. Aortic Atherosclerosis (ICD10-I70.0). Electronically Signed   By: Marijo Conception M.D.   On: 12/30/2020 15:56    Cardiac Studies   2021 Echo  1. There is significant apical-basal and septal-lateral pacing-induced  dyssynchrony. Left ventricular ejection fraction, by estimation, is 40 to  45%. The left ventricle has mildly decreased function. The left ventricle  demonstrates regional wall motion  abnormalities (see scoring diagram/findings for description). There is  moderate concentric left ventricular hypertrophy. Left ventricular  diastolic parameters are consistent with Grade II diastolic dysfunction  (pseudonormalization). Elevated left atrial  pressure. There is moderate dyskinesis of the left ventricular, entire  apical segment, although this may be artifactual (pacing related).  2. Right ventricular systolic function is normal. The right ventricular  size is normal. Mildly increased right ventricular wall thickness. There  is mildly elevated pulmonary artery systolic pressure. The estimated right  ventricular systolic pressure is  47.8 mmHg.  3. Left atrial size was mildly dilated.  4. Right atrial size was mildly dilated.  5. The mitral valve is normal in structure. Mild mitral valve  regurgitation.  6. Tricuspid valve regurgitation is mild to moderate.  7. The aortic valve is tricuspid. Aortic valve regurgitation is trivial.  Very mild aortic valve stenosis.  8. The inferior vena cava is normal in size with greater than 50%  respiratory variability, suggesting right atrial pressure of 3 mmHg.    Patient Profile     87 y.o. female with SSS s/p PPM HTN with Dm, HFrEF ESRD on HD, OSA on  CPAP, MM recently seen by EP.  Patient had developed new atypical atrial flutter on seeing Dr. Rayann Heman 12/29/20.  Assessment & Plan    Atypical Atrial Flutter/SSS  - S/p [employer] PPM - TSH WNL, imaging notable for mild left atrial dilation in the past - 2D echo pending to reassess LVF>> worsening HFrEF may expedite more aggressive rate control; discussions of CS lead, etc.  - Continue anticoagulation with eliquis 2.5mg  BID (dosed for age>80 and wt < 60kg) - Continue rate  control without medication - Rhythm control options would including amiodarone; LFTs WNL on admission - tele looks like back in NSR?>>will get 12 lead EKG - Considerations for DCCV on Monday (not ordered yet)- has only missed AM dose 12/31/20   Heart Failure Reduced Ejection Fraction  - NYHA class II, Stage C, euvolemic, etiology from tachycardia mediated vs pacing - Diuretic regimen: volume removal with HD - Discussed the importance of fluid restriction of < 2 L, salt restriction, and checking daily weights  - K+ 4 today>> keep K>4 and Mg>2. - on no GDMT due to hypotension   For questions or updates, please contact [facility] Please consult [email or web address] for contact info under        Signed, Fransico Him, MD  01/01/2021, 11:57 AM

## 2021-01-01 NOTE — Progress Notes (Signed)
Patient weaned down to Room Air.  Current O2 is 97% on Room Air.  No complaints of shortness of breath.  Will continue to monitor patient.  Earleen Reaper RN

## 2021-01-01 NOTE — Progress Notes (Signed)
  Echocardiogram 2D Echocardiogram has been performed.  [name] F 01/01/2021, 4:01 PM

## 2021-01-02 DIAGNOSIS — J9601 Acute respiratory failure with hypoxia: Secondary | ICD-10-CM | POA: Diagnosis not present

## 2021-01-02 DIAGNOSIS — I10 Essential (primary) hypertension: Secondary | ICD-10-CM | POA: Diagnosis not present

## 2021-01-02 DIAGNOSIS — N186 End stage renal disease: Secondary | ICD-10-CM | POA: Diagnosis not present

## 2021-01-02 DIAGNOSIS — I484 Atypical atrial flutter: Secondary | ICD-10-CM | POA: Diagnosis not present

## 2021-01-02 LAB — CBC WITH DIFFERENTIAL/PLATELET
Abs Immature Granulocytes: 0.03 10*3/uL (ref 0.00–0.07)
Basophils Absolute: 0 10*3/uL (ref 0.0–0.1)
Basophils Relative: 0 %
Eosinophils Absolute: 0 10*3/uL (ref 0.0–0.5)
Eosinophils Relative: 0 %
HCT: 20.7 % — ABNORMAL LOW (ref 36.0–46.0)
Hemoglobin: 7.1 g/dL — ABNORMAL LOW (ref 12.0–15.0)
Immature Granulocytes: 1 %
Lymphocytes Relative: 22 %
Lymphs Abs: 1.2 10*3/uL (ref 0.7–4.0)
MCH: 32.4 pg (ref 26.0–34.0)
MCHC: 34.3 g/dL (ref 30.0–36.0)
MCV: 94.5 fL (ref 80.0–100.0)
Monocytes Absolute: 0.5 10*3/uL (ref 0.1–1.0)
Monocytes Relative: 8 %
Neutro Abs: 3.8 10*3/uL (ref 1.7–7.7)
Neutrophils Relative %: 69 %
Platelets: 112 10*3/uL — ABNORMAL LOW (ref 150–400)
RBC: 2.19 MIL/uL — ABNORMAL LOW (ref 3.87–5.11)
RDW: 17.4 % — ABNORMAL HIGH (ref 11.5–15.5)
WBC: 5.5 10*3/uL (ref 4.0–10.5)
nRBC: 0 % (ref 0.0–0.2)

## 2021-01-02 LAB — ECHOCARDIOGRAM COMPLETE
AR max vel: 1.29 cm2
AV Area VTI: 1.24 cm2
AV Area mean vel: 1.27 cm2
AV Mean grad: 8 mmHg
AV Peak grad: 14.6 mmHg
Ao pk vel: 1.91 m/s
Calc EF: 19.6 %
Height: 65 in
S' Lateral: 4 cm
Single Plane A2C EF: 34 %
Single Plane A4C EF: 8.8 %
Weight: 1657.86 oz

## 2021-01-02 LAB — GLUCOSE, CAPILLARY
Glucose-Capillary: 116 mg/dL — ABNORMAL HIGH (ref 70–99)
Glucose-Capillary: 120 mg/dL — ABNORMAL HIGH (ref 70–99)
Glucose-Capillary: 81 mg/dL (ref 70–99)
Glucose-Capillary: 137 mg/dL — ABNORMAL HIGH (ref 70–99)
Glucose-Capillary: 99 mg/dL (ref 70–99)

## 2021-01-02 LAB — BASIC METABOLIC PANEL
Anion gap: 6 (ref 5–15)
BUN: 6 mg/dL — ABNORMAL LOW (ref 8–23)
CO2: 31 mmol/L (ref 22–32)
Calcium: 8.1 mg/dL — ABNORMAL LOW (ref 8.9–10.3)
Chloride: 98 mmol/L (ref 98–111)
Creatinine, Ser: 1.12 mg/dL — ABNORMAL HIGH (ref 0.44–1.00)
GFR, Estimated: 47 mL/min — ABNORMAL LOW (ref >60)
Glucose, Bld: 110 mg/dL — ABNORMAL HIGH (ref 70–99)
Potassium: 2.9 mmol/L — ABNORMAL LOW (ref 3.5–5.1)
Sodium: 135 mmol/L (ref 135–145)

## 2021-01-02 LAB — POTASSIUM: Potassium: 4 mmol/L (ref 3.5–5.1)

## 2021-01-02 MED ORDER — POTASSIUM CHLORIDE CRYS ER 20 MEQ PO TBCR
40.0000 meq | EXTENDED_RELEASE_TABLET | Freq: Once | ORAL | Status: AC
  Administered 2021-01-02: 40 meq via ORAL
  Filled 2021-01-02: qty 2

## 2021-01-02 MED ORDER — DOXERCALCIFEROL 4 MCG/2ML IV SOLN
1.0000 ug | INTRAVENOUS | Status: DC
  Filled 2021-01-02 (×2): qty 2

## 2021-01-02 MED ORDER — DARBEPOETIN ALFA 60 MCG/0.3ML IJ SOSY: 50.0000 ug | PREFILLED_SYRINGE | INTRAMUSCULAR | Status: DC

## 2021-01-02 MED ORDER — SODIUM CHLORIDE 0.9 % IV SOLN: 125.0000 mg | INTRAVENOUS | Status: DC

## 2021-01-02 NOTE — Progress Notes (Signed)
PROGRESS NOTE    [name]  YTR:[id number] DOB: 07-Jul-1930 DOA: 12/30/2020 PCP: Lucianne Lei, MD   Brief Narrative:  [name] a 87 y.o.femalewith medical history significant ofDM2, ESRD, HTN, MM, SSS bradycardia s/p PPM, atrial flutter presented to the hospital with shortness of breath. Patient was recently seen by cardiology as outpatient and was scheduled for cardioversion next week due to shortness of breath which was thought to be secondary to atrial flutter. She did have partial dialysis session and was brought into the hospital for evaluation of shortness of breath. Covid test was negative. Troponins were mildly elevated about baseline. Chest x-ray showed bilateral pleural effusion. Patient required 4 L of oxygen initially in the ED. Patient was then admitted to hospital for further evaluation and treatment  Hospital course: Patient presented to the hospital with acute respiratory failure secondary to hypoxia.  This was attributed to decompensated heart failure in the setting of atypical a flutter.  She was successfully weaned off oxygen and was seen by cardiology as well as nephrology for end-stage renal disease on hemodialysis  Initially cardiology plans on doing inpatient cardioversion on Monday but late Sunday afternoon elected to pursue that as an outpatient.  Patient did report still feeling mildly short of breath on Sunday and was resistant to discharge home but agreed  respiratory status was stable she should be able to go home Monday  Patient was also seen by nephrology while in the hospital for end-stage renal disease and will continue dialysis on a TTS schedule.  While in the hospital patient's electrolytes were monitored without complication.  While in hospital we continued treatment of patient's diabetes.  She was on sliding scale insulin to continue her home medications on discharge as well as diabetic diet.  Finally continue Decadron and allopurinol  for patient's multiple myeloma.   Assessment & Plan:   Principal Problem:   Acute respiratory failure with hypoxia (HCC) Active Problems:   Multiple myeloma not having achieved remission (HCC)   ESRD (end stage renal disease) on dialysis (HCC)   Atypical atrial flutter (HCC)   Fluid overload   Protein-calorie malnutrition, severe   Atrial flutter (HCC)   Acute resp failure with hypoxia -Likely secondary to decompensated heart failure in the setting of atypical atrial flutter. Weaned off 4 L of oxygen by nasal cannula. Cardiology has been consulted. Nephrology on board as well. No signs of infection such as pneumonia. Continue dialysis as per nephrology.  Persistent atypical atrial flutter. Cardiology has been consulted. Continue Eliquis. There was plan for cardioversion as outpatient. Cardiology evaluating for poss DCCV on monday  Diabetes mellitus type II. On glimepiride at home. Hold OHA. We will add sliding scale insulin.. Continue diabetic diet, Accu-Cheks.  ESRDon hemodialysis.- Hemodialysis as per nephrology. Nephrology on board.  Multiple myeloma. Cont decadron,allopurinol.  Severe protein calorie malnutrition.Present on admission. Evidence for significant weight loss, severe fat depletion, seen by dietitian. Continue nutritional supplements.  DVT prophylaxis:apixaban (ELIQUIS) tablet 2.5 mg Start: 12/30/20 2200 apixaban (ELIQUIS) tablet 2.5 mg  DVT prophylaxis: On:Eliquis  Code Status: FULL    Code Status Orders  (From admission, onward)         Start     Ordered   12/30/20 2115  Full code  Continuous        03 /17/22 2118        Code Status History    Date Active Date Inactive Code Status Order ID Comments User Context   09/11/2020 0724 09/12/2020 1909 Full  Code [id number]  Edwin Dada, MD Inpatient   04/02/2020 9983 04/04/2020 2122 Full Code [id number]  Vianne Bulls, MD ED   05/25/2019 2131 05/26/2019 2030 Full Code  [id number]  Bonnell Public, MD ED   02/25/2019 1358 03/11/2019 2007 Full Code [id number]  Georgette Shell, MD ED   02/25/2019 1300 02/25/2019 1358 Full Code [id number]  Georgette Shell, MD ED   05/16/2016 1510 05/16/2016 2008 Full Code [id number]  Thompson Grayer, MD Inpatient   03/09/2015 2011 03/10/2015 2036 Full Code [id number]  Mendel Corning, MD Inpatient   Advance Care Planning Activity    Advance Directive Documentation   Flowsheet Row Most Recent Value  Type of Advance Directive Healthcare Power of Attorney  Pre-existing out of facility DNR order (yellow form or pink MOST form) --  "MOST" Form in Place? --     Family Communication: [place], discussed with her son in full detail Saturday Disposition Plan:    Status is: Inpatient  Remains inpatient appropriate because:, IV treatments appropriate due to intensity of illness or inability to take PO and Inpatient level of care appropriate due to severity of illness, and prob DCCV   monday  Dispo: Patient From: Home Planned Disposition: Home with [facility] medically stable for discharge: no. Consults called: None Admission status: Inpatient   Consultants:   Cardiology, nephrology  Procedures:  DG Chest Portable 1 View  Result Date: 12/30/2020 CLINICAL DATA:  Shortness of breath. EXAM: PORTABLE CHEST 1 VIEW COMPARISON:  September 11, 2020. FINDINGS: Stable cardiomediastinal silhouette. Left-sided pacemaker is unchanged in position. No pneumothorax is noted. Mild bibasilar atelectasis is noted with probable small pleural effusions. Bony thorax is unremarkable. IMPRESSION: Mild bibasilar subsegmental atelectasis with probable small pleural effusions. Aortic Atherosclerosis (ICD10-I70.0). Electronically Signed   By: Marijo Conception M.D.   On: 12/30/2020 15:56   ECHOCARDIOGRAM COMPLETE  Result Date: 01/02/2021    ECHOCARDIOGRAM REPORT   Patient Name:   [name] Date of Exam:  01/01/2021 Medical Rec #:  [id number]        Height:       65.0 in Accession #:    [id number]       Weight:       103.6 lb Date of Birth:  05-25-1930       BSA:          1.496 m Patient Age:    36 years         BP:           104/55 mmHg Patient Gender: F                HR:           78 bpm. Exam Location:  Inpatient Procedure: 2D Echo, Cardiac Doppler and Color Doppler Indications:     I50.9* Heart failure (unspecified)  History:         Patient has prior history of Echocardiogram examinations, most                  recent 04/02/2020. Pacemaker; Arrythmias:Atrial Flutter. H/O                  Breast cancer.  Sonographer:     [name] Referring Phys:  [id number] [facility] A Gasper Sells Diagnosing Phys: Ena Dawley MD IMPRESSIONS  1. When compared to the prior study from 04/02/2021 LVEF is significantly worse with diffuse hypokinesis and severe dyssynchrony secondary to RV pacing. LVEF 25-30%, previously 40-45%.  2. Left ventricular ejection fraction, by estimation, is 25 to 30%. The left ventricle has severely decreased function. The left ventricle demonstrates global hypokinesis. There is moderate concentric left ventricular hypertrophy. Left ventricular diastolic parameters are consistent with Grade II diastolic dysfunction (pseudonormalization). Elevated left atrial pressure.  3. Right ventricular systolic function is normal. The right ventricular size is normal. There is severely elevated pulmonary artery systolic pressure. The estimated right ventricular systolic pressure is 72.0 mmHg.  4. Left atrial size was moderately dilated.  5. Right atrial size was mildly dilated.  6. A small pericardial effusion is present. The pericardial effusion is circumferential. Large pleural effusion in the left lateral region.  7. The mitral valve is normal in structure. Moderate mitral valve regurgitation. No evidence of mitral stenosis.  8. Tricuspid valve regurgitation is severe.  9. The aortic valve is normal in structure.  Aortic valve regurgitation is not visualized. Mild to moderate aortic valve sclerosis/calcification is present, without any evidence of aortic stenosis. Aortic valve mean gradient measures 8.0 mmHg. 10. Mild pulmonic stenosis. 11. The inferior vena cava is dilated in size with <50% respiratory variability, suggesting right atrial pressure of 15 mmHg. FINDINGS  Left Ventricle: Left ventricular ejection fraction, by estimation, is 25 to 30%. The left ventricle has severely decreased function. The left ventricle demonstrates global hypokinesis. The left ventricular internal cavity size was normal in size. There is moderate concentric left ventricular hypertrophy. Abnormal (paradoxical) septal motion, consistent with RV pacemaker. Left ventricular diastolic parameters are consistent with Grade II diastolic dysfunction (pseudonormalization). Elevated left atrial pressure. Right Ventricle: The right ventricular size is normal. No increase in right ventricular wall thickness. Right ventricular systolic function is normal. There is severely elevated pulmonary artery systolic pressure. The tricuspid regurgitant velocity is 3.54 m/s, and with an assumed right atrial pressure of 15 mmHg, the estimated right ventricular systolic pressure is 94.7 mmHg. Left Atrium: Left atrial size was moderately dilated. Right Atrium: Right atrial size was mildly dilated. Pericardium: A small pericardial effusion is present. The pericardial effusion is circumferential. Mitral Valve: The mitral valve is normal in structure. Moderate mitral valve regurgitation. No evidence of mitral valve stenosis. Tricuspid Valve: The tricuspid valve is normal in structure. Tricuspid valve regurgitation is severe. No evidence of tricuspid stenosis. Aortic Valve: The aortic valve is normal in structure. Aortic valve regurgitation is not visualized. Mild to moderate aortic valve sclerosis/calcification is present, without any evidence of aortic stenosis. Aortic  valve mean gradient measures 8.0 mmHg. Aortic valve peak gradient measures 14.6 mmHg. Aortic valve area, by VTI measures 1.24 cm. Pulmonic Valve: The pulmonic valve was normal in structure. Pulmonic valve regurgitation is not visualized. Mild pulmonic stenosis. Aorta: The aortic root is normal in size and structure. Venous: The inferior vena cava is dilated in size with less than 50% respiratory variability, suggesting right atrial pressure of 15 mmHg. IAS/Shunts: No atrial level shunt detected by color flow Doppler. Additional Comments: A device lead is visualized in the right ventricle and right atrium. There is a large pleural effusion in the left lateral region.  LEFT VENTRICLE PLAX 2D LVIDd:         4.60 cm      Diastology LVIDs:         4.00 cm      LV e' lateral: 7.07 cm/s LV PW:         1.40 cm LV IVS:        1.40 cm LVOT diam:     2.00  cm LV SV:         43 LV SV Index:   29 LVOT Area:     3.14 cm  LV Volumes (MOD) LV vol d, MOD A2C: 104.0 ml LV vol d, MOD A4C: 148.0 ml LV vol s, MOD A2C: 68.6 ml LV vol s, MOD A4C: 135.0 ml LV SV MOD A2C:     35.4 ml LV SV MOD A4C:     148.0 ml LV SV MOD BP:      24.5 ml RIGHT VENTRICLE            IVC RV Basal diam:  3.70 cm    IVC diam: 1.90 cm RV S prime:     8.81 cm/s TAPSE (M-mode): 1.4 cm LEFT ATRIUM              Index       RIGHT ATRIUM           Index LA diam:        3.90 cm  2.61 cm/m  RA Area:     25.30 cm LA Vol (A2C):   111.0 ml 74.21 ml/m RA Volume:   76.70 ml  51.28 ml/m LA Vol (A4C):   78.5 ml  52.48 ml/m LA Biplane Vol: 96.2 ml  64.31 ml/m  AORTIC VALVE AV Area (Vmax):    1.29 cm AV Area (Vmean):   1.27 cm AV Area (VTI):     1.24 cm AV Vmax:           191.00 cm/s AV Vmean:          128.000 cm/s AV VTI:            0.347 m AV Peak Grad:      14.6 mmHg AV Mean Grad:      8.0 mmHg LVOT Vmax:         78.30 cm/s LVOT Vmean:        51.800 cm/s LVOT VTI:          0.137 m LVOT/AV VTI ratio: 0.39  AORTA Ao Root diam: 3.30 cm Ao Asc diam:  2.80 cm TRICUSPID  VALVE TR Peak grad:   50.1 mmHg TR Vmax:        354.00 cm/s  SHUNTS Systemic VTI:  0.14 m Systemic Diam: 2.00 cm Ena Dawley MD Electronically signed by Ena Dawley MD Signature Date/Time: 01/02/2021/12:21:23 PM    Final (Updated)    CUP PACEART INCLINIC DEVICE CHECK  Result Date: 12/29/2020 Patient seen in device clinic today to attempt ANIPS. Was performed by Dr. Rayann Heman, patient initially was AFL and converted to AF. Would like patient to send manual transmission 12/30/20 to assess presenting. Patient will be scheduled cardioversion.Lavenia Atlas, BSN, RN    Antimicrobials:   None   Subjective: Patient reported feeling short of breath No other acute decompensation  Objective: Vitals:   01/02/21 0303 01/02/21 0318 01/02/21 0430 01/02/21 0854  BP: (!) 114/57 (!) 107/51 (!) 108/52 (!) 111/56  Pulse: 79 81 68 75  Resp: 13 12 17 18   Temp:  98 F (36.7 C) (!) 97.5 F (36.4 C) 98.2 F (36.8 C)  TempSrc:  Oral Oral Oral  SpO2: 99% 100% 100% 100%  Weight:      Height:        Intake/Output Summary (Last 24 hours) at 01/02/2021 1543 Last data filed at 01/02/2021 1320 Gross per 24 hour  Intake 960 ml  Output 650 ml  Net 310 ml   [employer]  12/30/20 2305 12/31/20 1328  Weight: 46.4 kg 47 kg    Examination:  General exam: Appears calm and comfortable  Respiratory system: Trace rales bilateral bases. Cardiovascular system: S1 & S2 heard, RRR. No JVD, murmurs, rubs, gallops or clicks. No pedal edema. Gastrointestinal system: Abdomen is nondistended, soft and nontender. No organomegaly or masses felt. Normal bowel sounds heard. Central nervous system: Alert and oriented. No focal neurological deficits. Extremities: Warm well perfused, trace edema Skin: No rashes, lesions or ulcers Psychiatry: Judgement and insight appear normal.  Poor memory suspect early mild cognitive deficit,  mood & affect appropriate.     Data Reviewed: I have personally reviewed following  labs and imaging studies  CBC: Recent Labs  Lab 12/30/20 1547 12/30/20 2303 12/31/20 0236 01/01/21 0934 01/02/21 0559  WBC 5.7 7.0 5.8 5.7 5.5  NEUTROABS 3.5  --   --  4.3 3.8  HGB 9.7* 8.1* 7.2* 8.8* 7.1*  HCT 28.6* 22.6* 20.7* 25.5* 20.7*  MCV 93.5 91.9 92.8 94.1 94.5  PLT 181 135* 128* 122* 771*   Basic Metabolic Panel: Recent Labs  Lab 12/30/20 1547 12/30/20 2303 12/31/20 0236 01/01/21 0934 01/02/21 0559  NA 136 135 135 134* 135  K 3.3* 3.5 3.4* 4.0 2.9*  CL 96* 97* 97* 97* 98  CO2 30 30 31 30 31   GLUCOSE 80 95 86 122* 110*  BUN 9 11 12 8  6*  CREATININE 1.72* 2.03* 2.10* 1.98* 1.12*  CALCIUM 9.1 8.3* 8.1* 8.4* 8.1*  MG 2.0  --   --   --   --   PHOS  --  2.6  --   --   --    GFR: Estimated Creatinine Clearance: 25.8 mL/min (A) (by C-G formula based on SCr of 1.12 mg/dL (H)). Liver Function Tests: Recent Labs  Lab 12/30/20 1547 12/30/20 2303  AST 28  --   ALT 18  --   ALKPHOS 44  --   BILITOT 1.7*  --   PROT 6.0*  --   ALBUMIN 4.0 3.1*   No results for input(s): LIPASE, AMYLASE in the last 168 hours. No results for input(s): AMMONIA in the last 168 hours. Coagulation Profile: No results for input(s): INR, PROTIME in the last 168 hours. Cardiac Enzymes: No results for input(s): CKTOTAL, CKMB, CKMBINDEX, TROPONINI in the last 168 hours. BNP (last 3 results) No results for input(s): PROBNP in the last 8760 hours. HbA1C: Recent Labs    12/31/20 0236  HGBA1C 4.6*   CBG: Recent Labs  Lab 01/01/21 1633 01/01/21 2155 01/02/21 0435 01/02/21 0717 01/02/21 1140  GLUCAP 114* 111* 116* 120* 81   Lipid Profile: No results for input(s): CHOL, HDL, LDLCALC, TRIG, CHOLHDL, LDLDIRECT in the last 72 hours. Thyroid Function Tests: Recent Labs    12/30/20 1547  TSH 2.272   Anemia Panel: Recent Labs    12/31/20 1217  FERRITIN 1,226*  TIBC 171*  IRON 63   Sepsis Labs: Recent Labs  Lab 12/30/20 1545 12/30/20 1740  LATICACIDVEN 2.0* 1.4     Recent Results (from the past 240 hour(s))  Resp Panel by RT-PCR (Flu A&B, Covid) Nasopharyngeal Swab     Status: None   Collection Time: 12/30/20  3:28 PM   Specimen: Nasopharyngeal Swab; Nasopharyngeal(NP) swabs in vial transport medium  Result Value Ref Range Status   SARS Coronavirus 2 by RT PCR NEGATIVE NEGATIVE Final    Comment: (NOTE) SARS-CoV-2 target nucleic acids are NOT DETECTED.  The SARS-CoV-2 RNA is generally detectable  in upper respiratory specimens during the acute phase of infection. The lowest concentration of SARS-CoV-2 viral copies this assay can detect is 138 copies/mL. A negative result does not preclude SARS-Cov-2 infection and should not be used as the sole basis for treatment or other patient management decisions. A negative result may occur with  improper specimen collection/handling, submission of specimen other than nasopharyngeal swab, presence of viral mutation(s) within the areas targeted by this assay, and inadequate number of viral copies(<138 copies/mL). A negative result must be combined with clinical observations, patient history, and epidemiological information. The expected result is Negative.  Fact Sheet for Patients:  EntrepreneurPulse.com.au  Fact Sheet for Healthcare Providers:  IncredibleEmployment.be  This test is no t yet approved or cleared by the [place] FDA and  has been authorized for detection and/or diagnosis of SARS-CoV-2 by FDA under an Emergency Use Authorization (EUA). This EUA will remain  in effect (meaning this test can be used) for the duration of the COVID-19 declaration under Section 564(b)(1) of the Act, 21 U.S.C.section 360bbb-3(b)(1), unless the authorization is terminated  or revoked sooner.       Influenza A by PCR NEGATIVE NEGATIVE Final   Influenza B by PCR NEGATIVE NEGATIVE Final    Comment: (NOTE) The Xpert Xpress SARS-CoV-2/FLU/RSV plus assay is intended as an  aid in the diagnosis of influenza from Nasopharyngeal swab specimens and should not be used as a sole basis for treatment. Nasal washings and aspirates are unacceptable for Xpert Xpress SARS-CoV-2/FLU/RSV testing.  Fact Sheet for Patients: EntrepreneurPulse.com.au  Fact Sheet for Healthcare Providers: IncredibleEmployment.be  This test is not yet approved or cleared by the [place] FDA and has been authorized for detection and/or diagnosis of SARS-CoV-2 by FDA under an Emergency Use Authorization (EUA). This EUA will remain in effect (meaning this test can be used) for the duration of the COVID-19 declaration under Section 564(b)(1) of the Act, 21 U.S.C. section 360bbb-3(b)(1), unless the authorization is terminated or revoked.  Performed at [facility], [place] [address], [place], [place] [address]   MRSA PCR Screening     Status: None   Collection Time: 12/31/20  6:53 AM   Specimen: Nasopharyngeal  Result Value Ref Range Status   MRSA by PCR NEGATIVE NEGATIVE Final    Comment:        The GeneXpert MRSA Assay (FDA approved for NASAL specimens only), is one component of a comprehensive MRSA colonization surveillance program. It is not intended to diagnose MRSA infection nor to guide or monitor treatment for MRSA infections. Performed at [facility], [place] [address] [place], [place] [address]          Radiology Studies: ECHOCARDIOGRAM COMPLETE  Result Date: 01/02/2021    ECHOCARDIOGRAM REPORT   Patient Name:   [name] Date of Exam: 01/01/2021 Medical Rec #:  [id number]        Height:       65.0 in Accession #:    [id number]       Weight:       103.6 lb Date of Birth:  [birth year]       BSA:          1.496 m Patient Age:    54 years         BP:           104/55 mmHg Patient Gender: F                HR:  78 bpm. Exam Location:  Inpatient Procedure: 2D Echo, Cardiac Doppler and Color Doppler Indications:      I50.9* Heart failure (unspecified)  History:         Patient has prior history of Echocardiogram examinations, most                  recent 04/02/2020. Pacemaker; Arrythmias:Atrial Flutter. H/O                  Breast cancer.  Sonographer:     [name] Referring Phys:  [id number] [facility] A Gasper Sells Diagnosing Phys: Ena Dawley MD IMPRESSIONS  1. When compared to the prior study from 04/02/2021 LVEF is significantly worse with diffuse hypokinesis and severe dyssynchrony secondary to RV pacing. LVEF 25-30%, previously 40-45%.  2. Left ventricular ejection fraction, by estimation, is 25 to 30%. The left ventricle has severely decreased function. The left ventricle demonstrates global hypokinesis. There is moderate concentric left ventricular hypertrophy. Left ventricular diastolic parameters are consistent with Grade II diastolic dysfunction (pseudonormalization). Elevated left atrial pressure.  3. Right ventricular systolic function is normal. The right ventricular size is normal. There is severely elevated pulmonary artery systolic pressure. The estimated right ventricular systolic pressure is 93.2 mmHg.  4. Left atrial size was moderately dilated.  5. Right atrial size was mildly dilated.  6. A small pericardial effusion is present. The pericardial effusion is circumferential. Large pleural effusion in the left lateral region.  7. The mitral valve is normal in structure. Moderate mitral valve regurgitation. No evidence of mitral stenosis.  8. Tricuspid valve regurgitation is severe.  9. The aortic valve is normal in structure. Aortic valve regurgitation is not visualized. Mild to moderate aortic valve sclerosis/calcification is present, without any evidence of aortic stenosis. Aortic valve mean gradient measures 8.0 mmHg. 10. Mild pulmonic stenosis. 11. The inferior vena cava is dilated in size with <50% respiratory variability, suggesting right atrial pressure of 15 mmHg. FINDINGS  Left Ventricle: Left  ventricular ejection fraction, by estimation, is 25 to 30%. The left ventricle has severely decreased function. The left ventricle demonstrates global hypokinesis. The left ventricular internal cavity size was normal in size. There is moderate concentric left ventricular hypertrophy. Abnormal (paradoxical) septal motion, consistent with RV pacemaker. Left ventricular diastolic parameters are consistent with Grade II diastolic dysfunction (pseudonormalization). Elevated left atrial pressure. Right Ventricle: The right ventricular size is normal. No increase in right ventricular wall thickness. Right ventricular systolic function is normal. There is severely elevated pulmonary artery systolic pressure. The tricuspid regurgitant velocity is 3.54 m/s, and with an assumed right atrial pressure of 15 mmHg, the estimated right ventricular systolic pressure is 67.1 mmHg. Left Atrium: Left atrial size was moderately dilated. Right Atrium: Right atrial size was mildly dilated. Pericardium: A small pericardial effusion is present. The pericardial effusion is circumferential. Mitral Valve: The mitral valve is normal in structure. Moderate mitral valve regurgitation. No evidence of mitral valve stenosis. Tricuspid Valve: The tricuspid valve is normal in structure. Tricuspid valve regurgitation is severe. No evidence of tricuspid stenosis. Aortic Valve: The aortic valve is normal in structure. Aortic valve regurgitation is not visualized. Mild to moderate aortic valve sclerosis/calcification is present, without any evidence of aortic stenosis. Aortic valve mean gradient measures 8.0 mmHg. Aortic valve peak gradient measures 14.6 mmHg. Aortic valve area, by VTI measures 1.24 cm. Pulmonic Valve: The pulmonic valve was normal in structure. Pulmonic valve regurgitation is not visualized. Mild pulmonic stenosis. Aorta: The aortic root is normal in  size and structure. Venous: The inferior vena cava is dilated in size with less than  50% respiratory variability, suggesting right atrial pressure of 15 mmHg. IAS/Shunts: No atrial level shunt detected by color flow Doppler. Additional Comments: A device lead is visualized in the right ventricle and right atrium. There is a large pleural effusion in the left lateral region.  LEFT VENTRICLE PLAX 2D LVIDd:         4.60 cm      Diastology LVIDs:         4.00 cm      LV e' lateral: 7.07 cm/s LV PW:         1.40 cm LV IVS:        1.40 cm LVOT diam:     2.00 cm LV SV:         43 LV SV Index:   29 LVOT Area:     3.14 cm  LV Volumes (MOD) LV vol d, MOD A2C: 104.0 ml LV vol d, MOD A4C: 148.0 ml LV vol s, MOD A2C: 68.6 ml LV vol s, MOD A4C: 135.0 ml LV SV MOD A2C:     35.4 ml LV SV MOD A4C:     148.0 ml LV SV MOD BP:      24.5 ml RIGHT VENTRICLE            IVC RV Basal diam:  3.70 cm    IVC diam: 1.90 cm RV S prime:     8.81 cm/s TAPSE (M-mode): 1.4 cm LEFT ATRIUM              Index       RIGHT ATRIUM           Index LA diam:        3.90 cm  2.61 cm/m  RA Area:     25.30 cm LA Vol (A2C):   111.0 ml 74.21 ml/m RA Volume:   76.70 ml  51.28 ml/m LA Vol (A4C):   78.5 ml  52.48 ml/m LA Biplane Vol: 96.2 ml  64.31 ml/m  AORTIC VALVE AV Area (Vmax):    1.29 cm AV Area (Vmean):   1.27 cm AV Area (VTI):     1.24 cm AV Vmax:           191.00 cm/s AV Vmean:          128.000 cm/s AV VTI:            0.347 m AV Peak Grad:      14.6 mmHg AV Mean Grad:      8.0 mmHg LVOT Vmax:         78.30 cm/s LVOT Vmean:        51.800 cm/s LVOT VTI:          0.137 m LVOT/AV VTI ratio: 0.39  AORTA Ao Root diam: 3.30 cm Ao Asc diam:  2.80 cm TRICUSPID VALVE TR Peak grad:   50.1 mmHg TR Vmax:        354.00 cm/s  SHUNTS Systemic VTI:  0.14 m Systemic Diam: 2.00 cm Ena Dawley MD Electronically signed by Ena Dawley MD Signature Date/Time: 01/02/2021/12:21:23 PM    Final (Updated)         Scheduled Meds: . (feeding supplement) PROSource Plus  30 mL Oral TID BM  . acyclovir  400 mg Oral Daily  . allopurinol  50 mg  Oral Daily  . apixaban  2.5 mg Oral BID  . Chlorhexidine Gluconate Cloth  6 each Topical Q0600  . cholecalciferol  2,000 Units Oral Daily  . [START ON 01/08/2021] darbepoetin (ARANESP) injection - DIALYSIS  50 mcg Intravenous Q Sat-HD  . dexamethasone  2 mg Oral Daily  . dorzolamide  1 drop Left Eye BID  . [START ON 01/04/2021] doxercalciferol  1 mcg Intravenous Once per day on Tue Thu  . feeding supplement (NEPRO CARB STEADY)  237 mL Oral BID BM  . fluorometholone  1 drop Right Eye Daily  . insulin aspart  0-6 Units Subcutaneous TID WC  . lactulose  10 g Oral TID  . latanoprost  1 drop Left Eye QHS  . metoCLOPramide  5 mg Oral TID AC  . midodrine  10 mg Oral Q T,Th,Sat-1800  . multivitamin  1 tablet Oral QHS  . polyethylene glycol  17 g Oral Daily  . pregabalin  50 mg Oral QHS  . senna-docusate  2 tablet Oral BID   Continuous Infusions:   LOS: 2 days    Time spent: Jette    Nicolette Bang, MD [facility]  If 7PM-7AM, please contact night-coverage  01/02/2021, 3:43 PM

## 2021-01-02 NOTE — Progress Notes (Addendum)
Progress Note  Patient Name: [name] Date of Encounter: 01/02/2021  [facility] Cardiologist: Jenkins Rouge, MD   Subjective   No SOB or chest pain.  Remains in atrial flutter with CVR  Inpatient Medications    Scheduled Meds: . (feeding supplement) PROSource Plus  30 mL Oral TID BM  . acyclovir  400 mg Oral Daily  . allopurinol  50 mg Oral Daily  . apixaban  2.5 mg Oral BID  . Chlorhexidine Gluconate Cloth  6 each Topical Q0600  . cholecalciferol  2,000 Units Oral Daily  . darbepoetin (ARANESP) injection - DIALYSIS  25 mcg Intravenous Q Fri-HD  . dexamethasone  2 mg Oral Daily  . dorzolamide  1 drop Left Eye BID  . feeding supplement (NEPRO CARB STEADY)  237 mL Oral BID BM  . fluorometholone  1 drop Right Eye Daily  . insulin aspart  0-6 Units Subcutaneous TID WC  . lactulose  10 g Oral TID  . latanoprost  1 drop Left Eye QHS  . metoCLOPramide  5 mg Oral TID AC  . midodrine  10 mg Oral Q T,Th,Sat-1800  . multivitamin  1 tablet Oral QHS  . polyethylene glycol  17 g Oral Daily  . pregabalin  50 mg Oral QHS  . senna-docusate  2 tablet Oral BID   Continuous Infusions:  PRN Meds: acetaminophen **OR** acetaminophen, lidocaine-prilocaine, ondansetron **OR** ondansetron (ZOFRAN) IV, oxyCODONE, pantoprazole   Vital Signs    Vitals:   01/02/21 0303 01/02/21 0318 01/02/21 0430 01/02/21 0854  BP: (!) 114/57 (!) 107/51 (!) 108/52 (!) 111/56  Pulse: 79 81 68 75  Resp: 13 12 17 18   Temp:  98 F (36.7 C) (!) 97.5 F (36.4 C) 98.2 F (36.8 C)  TempSrc:  Oral Oral Oral  SpO2: 99% 100% 100% 100%  Weight:      Height:        Intake/Output Summary (Last 24 hours) at 01/02/2021 1125 Last data filed at 01/02/2021 0900 Gross per 24 hour  Intake 1080 ml  Output 501 ml  Net 579 ml   Last 3 Weights 12/31/2020 12/30/2020 12/29/2020  Weight (lbs) 103 lb 9.9 oz 102 lb 4.7 oz 105 lb  Weight (kg) 47 kg 46.4 kg 47.628 kg      Telemetry    Atrial flutter with PACs -  Personally Reviewed  ECG    Atrial flutter with CVR and LBBB - Personally Reviewed  Physical Exam   GEN: Well nourished, well developed in no acute distress HEENT: Normal NECK: No JVD; No carotid bruits LYMPHATICS: No lymphadenopathy CARDIAC: regular rate no murmurs, rubs, gallops RESPIRATORY:  Clear to auscultation without rales, wheezing or rhonchi  ABDOMEN: Soft, non-tender, non-distended MUSCULOSKELETAL:  No edema; No deformity  SKIN: Warm and dry NEUROLOGIC:  Alert and oriented x 3 PSYCHIATRIC:  Normal affect    Labs    High Sensitivity Troponin:   Recent Labs  Lab 12/30/20 1547 12/30/20 1740  TROPONINIHS 144* 123*      Chemistry Recent Labs  Lab 12/30/20 1547 12/30/20 2303 12/31/20 0236 01/01/21 0934 01/02/21 0559  NA 136 135 135 134* 135  K 3.3* 3.5 3.4* 4.0 2.9*  CL 96* 97* 97* 97* 98  CO2 30 30 31 30 31   GLUCOSE 80 95 86 122* 110*  BUN 9 11 12 8  6*  CREATININE 1.72* 2.03* 2.10* 1.98* 1.12*  CALCIUM 9.1 8.3* 8.1* 8.4* 8.1*  PROT 6.0*  --   --   --   --  ALBUMIN 4.0 3.1*  --   --   --   AST 28  --   --   --   --   ALT 18  --   --   --   --   ALKPHOS 44  --   --   --   --   BILITOT 1.7*  --   --   --   --   GFRNONAA 28* 23* 22* 24* 47*  ANIONGAP 10 8 7 7 6      Hematology Recent Labs  Lab 12/31/20 0236 01/01/21 0934 01/02/21 0559  WBC 5.8 5.7 5.5  RBC 2.23* 2.71* 2.19*  HGB 7.2* 8.8* 7.1*  HCT 20.7* 25.5* 20.7*  MCV 92.8 94.1 94.5  MCH 32.3 32.5 32.4  MCHC 34.8 34.5 34.3  RDW 17.4* 17.6* 17.4*  PLT 128* 122* 112*    BNPNo results for input(s): BNP, PROBNP in the last 168 hours.   DDimer No results for input(s): DDIMER in the last 168 hours.   CHA2DS2-VASc Score = 6 This indicates a 9.7% annual risk of stroke. The patient's score is based upon: CHF History: Yes HTN History: Yes Diabetes History: Yes Stroke History: No Vascular Disease History: No Age Score: 2 Gender Score: 1      Radiology    No results  found.  Cardiac Studies   2021 Echo  1. There is significant apical-basal and septal-lateral pacing-induced  dyssynchrony. Left ventricular ejection fraction, by estimation, is 40 to  45%. The left ventricle has mildly decreased function. The left ventricle  demonstrates regional wall motion  abnormalities (see scoring diagram/findings for description). There is  moderate concentric left ventricular hypertrophy. Left ventricular  diastolic parameters are consistent with Grade II diastolic dysfunction  (pseudonormalization). Elevated left atrial  pressure. There is moderate dyskinesis of the left ventricular, entire  apical segment, although this may be artifactual (pacing related).  2. Right ventricular systolic function is normal. The right ventricular  size is normal. Mildly increased right ventricular wall thickness. There  is mildly elevated pulmonary artery systolic pressure. The estimated right  ventricular systolic pressure is  09.9 mmHg.  3. Left atrial size was mildly dilated.  4. Right atrial size was mildly dilated.  5. The mitral valve is normal in structure. Mild mitral valve  regurgitation.  6. Tricuspid valve regurgitation is mild to moderate.  7. The aortic valve is tricuspid. Aortic valve regurgitation is trivial.  Very mild aortic valve stenosis.  8. The inferior vena cava is normal in size with greater than 50%  respiratory variability, suggesting right atrial pressure of 3 mmHg.    Patient Profile     87 y.o. female with SSS s/p PPM HTN with Dm, HFrEF ESRD on HD, OSA on CPAP, MM recently seen by EP.  Patient had developed new atypical atrial flutter on seeing Dr. Rayann Heman 12/29/20.  Assessment & Plan    Atypical Atrial Flutter/SSS  - S/P [employer] PPM - TSH WNL, imaging notable for mild left atrial dilation in the past - 2D echo pending to reassess LVF>> worsening HFrEF may expedite more aggressive rate control; discussions of CS lead, etc.  -  Continue anticoagulation with eliquis 2.5mg  BID (dosed for age>80 and wt < 60kg) - she is well rate control without medication - Rhythm control options would including amiodarone; LFTs WNL on admission - EKG yesterday showed atrial flutter with CVR - since she missed a dose of Eliquis and her HR is well controlled and Endo schedule  full for next few days, will plan outpt DCCV in 3 weeks if EF has not declined further on pending echo   Heart Failure Reduced Ejection Fraction  - NYHA class II, Stage C, euvolemic, etiology from tachycardia mediated vs pacing - Diuretic regimen: volume removal with HD - Discussed the importance of fluid restriction of < 2 L, salt restriction, and checking daily weights  - K+ 2.9 today>>replete per THR to  keep K>4 and Mg>2. - on no GDMT due to hypotension  I have spent a total of 30 minutes with patient reviewing  , telemetry, EKGs, labs and examining patient as well as establishing an assessment and plan that was discussed with the patient.  > 50% of time was spent in direct patient care.    For questions or updates, please contact [place] Please consult [email or web address] for contact info under        Signed, Fransico Him, MD  01/02/2021, 11:25 AM

## 2021-01-02 NOTE — Progress Notes (Addendum)
[facility] Progress Note   Subjective:     Ms. Gunnoe was seen and examined today at bedside. Doing well. Currently sitting in chair. Continues on RA. Denies CP, ABD pain, N/V/D. Tolerated yesterday's HD 01/01/21 with UF 576ml.   Objective Vitals:   01/02/21 0303 01/02/21 0318 01/02/21 0430 01/02/21 0854  BP: (!) 114/57 (!) 107/51 (!) 108/52 (!) 111/56  Pulse: 79 81 68 75  Resp: 13 12 17 18   Temp:  98 F (36.7 C) (!) 97.5 F (36.4 C) 98.2 F (36.8 C)  TempSrc:  Oral Oral Oral  SpO2: 99% 100% 100% 100%  Weight:      Height:       Physical Exam General:Appears comfortable, not in acute respiratory distress Heart:Normal S1 and S2; No murmurs, gallops, or friction rub Lungs:Diminished at bases; no rales, wheezing or rhonchi; now on RA Abdomen:Soft, non-tender, non-distended, active bowel sounds Extremities:trace/1+ edema bilateral lower extremities Dialysis Access:R AVF  [facility] Weights   12/30/20 2305 12/31/20 1328  Weight: 46.4 kg 47 kg    Intake/Output Summary (Last 24 hours) at 01/02/2021 1213 Last data filed at 01/02/2021 0900 Gross per 24 hour  Intake 1080 ml  Output 501 ml  Net 579 ml    Additional Objective Labs: Basic Metabolic Panel: Recent Labs  Lab 12/30/20 2303 12/31/20 0236 01/01/21 0934 01/02/21 0559  NA 135 135 134* 135  K 3.5 3.4* 4.0 2.9*  CL 97* 97* 97* 98  CO2 30 31 30 31   GLUCOSE 95 86 122* 110*  BUN 11 12 8  6*  CREATININE 2.03* 2.10* 1.98* 1.12*  CALCIUM 8.3* 8.1* 8.4* 8.1*  PHOS 2.6  --   --   --    Liver Function Tests: Recent Labs  Lab 12/30/20 1547 12/30/20 2303  AST 28  --   ALT 18  --   ALKPHOS 44  --   BILITOT 1.7*  --   PROT 6.0*  --   ALBUMIN 4.0 3.1*   No results for input(s): LIPASE, AMYLASE in the last 168 hours. CBC: Recent Labs  Lab 12/30/20 1547 12/30/20 2303 12/31/20 0236 01/01/21 0934 01/02/21 0559  WBC 5.7 7.0 5.8 5.7 5.5  NEUTROABS 3.5  --   --  4.3 3.8  HGB 9.7* 8.1* 7.2* 8.8*  7.1*  HCT 28.6* 22.6* 20.7* 25.5* 20.7*  MCV 93.5 91.9 92.8 94.1 94.5  PLT 181 135* 128* 122* 112*   Blood Culture    Component Value Date/Time   SDES URINE, CLEAN CATCH 11/27/2019 1525   SPECREQUEST NONE 11/27/2019 1525   CULT  11/27/2019 1525    NO GROWTH Performed at [facility] [address] [place], [place] [address]    REPTSTATUS 11/28/2019 FINAL 11/27/2019 1525    Cardiac Enzymes: No results for input(s): CKTOTAL, CKMB, CKMBINDEX, TROPONINI in the last 168 hours. CBG: Recent Labs  Lab 01/01/21 1633 01/01/21 2155 01/02/21 0435 01/02/21 0717 01/02/21 1140  GLUCAP 114* 111* 116* 120* 81   Iron Studies:  Recent Labs    12/31/20 1217  IRON 63  TIBC 171*  FERRITIN 1,226*   Lab Results  Component Value Date   INR 1.0 04/02/2020   INR 1.57 (H) 01/03/2010   INR 1.37 01/02/2010   Studies/Results: No results found.  Medications:  . (feeding supplement) PROSource Plus  30 mL Oral TID BM  . acyclovir  400 mg Oral Daily  . allopurinol  50 mg Oral Daily  . apixaban  2.5 mg Oral BID  .  Chlorhexidine Gluconate Cloth  6 each Topical Q0600  . cholecalciferol  2,000 Units Oral Daily  . darbepoetin (ARANESP) injection - DIALYSIS  25 mcg Intravenous Q Fri-HD  . dexamethasone  2 mg Oral Daily  . dorzolamide  1 drop Left Eye BID  . feeding supplement (NEPRO CARB STEADY)  237 mL Oral BID BM  . fluorometholone  1 drop Right Eye Daily  . insulin aspart  0-6 Units Subcutaneous TID WC  . lactulose  10 g Oral TID  . latanoprost  1 drop Left Eye QHS  . metoCLOPramide  5 mg Oral TID AC  . midodrine  10 mg Oral Q T,Th,Sat-1800  . multivitamin  1 tablet Oral QHS  . polyethylene glycol  17 g Oral Daily  . pregabalin  50 mg Oral QHS  . senna-docusate  2 tablet Oral BID    Dialysis Orders: Outpatient rx: Unit: [facility]. TTS. [place], [id number], Bicarbonate35,400/500.Dialyzer:F180,heparin 2k bolus  Assessment/Plan: 1.Acute Respiratory Failure:  Possibly r/t A flutter. Seen by Cardiology as outpatient. Patient is scheduled for Cardioversion on 01/05/21. 2D Echo performed 01/01/21, awaiting results. 2. ESRD- HD TTS: Tolerated yesterday's HD with UF 595ml. Plan for HD on 01/04/21. K+ now 2.9, 75meq PO X 1 given today. Continue 3K bath. 3. Anemiaof CKD-Hgb trending downward; now at 7.1. Tsat now 37, Iron 63, and ferritin 1226. Iron series not indicated at this time. Will increase Aranesp 49mcg weekly (last dose given 01/01/21). Will continue to monitor Hgb trends. 4. Secondary hyperparathyroidism- Ca at 8.1. Will resume Hectorol 33mcg twice weekly to start 01/04/21 (last dose 12/30/20 OP HD).  Will continue to monitor trends.  5.HTN/volume- Patient's volume status is improving. No rales upon exam, weaned down to RA. Edema improving now at 1+ lower extremities.  Blood pressures stable. Continue to assess volume status to determine appropriate UFG 6. Nutrition- Recommend renal diet, will add Prosourse protein supplement.  [name], NP [facility] 01/02/2021,12:13 PM  LOS: 2 days

## 2021-01-03 ENCOUNTER — Other Ambulatory Visit (HOSPITAL_COMMUNITY): Payer: Medicare PPO

## 2021-01-03 ENCOUNTER — Inpatient Hospital Stay (HOSPITAL_COMMUNITY): Payer: Medicare PPO

## 2021-01-03 DIAGNOSIS — N186 End stage renal disease: Secondary | ICD-10-CM | POA: Diagnosis not present

## 2021-01-03 DIAGNOSIS — I484 Atypical atrial flutter: Secondary | ICD-10-CM | POA: Diagnosis not present

## 2021-01-03 DIAGNOSIS — I5043 Acute on chronic combined systolic (congestive) and diastolic (congestive) heart failure: Secondary | ICD-10-CM | POA: Diagnosis not present

## 2021-01-03 DIAGNOSIS — J9601 Acute respiratory failure with hypoxia: Secondary | ICD-10-CM | POA: Diagnosis not present

## 2021-01-03 DIAGNOSIS — I429 Cardiomyopathy, unspecified: Secondary | ICD-10-CM | POA: Diagnosis not present

## 2021-01-03 LAB — CBC WITH DIFFERENTIAL/PLATELET
Abs Immature Granulocytes: 0.02 10*3/uL (ref 0.00–0.07)
Basophils Absolute: 0 10*3/uL (ref 0.0–0.1)
Basophils Relative: 0 %
Eosinophils Absolute: 0 10*3/uL (ref 0.0–0.5)
Eosinophils Relative: 1 %
HCT: 22.9 % — ABNORMAL LOW (ref 36.0–46.0)
Hemoglobin: 7.5 g/dL — ABNORMAL LOW (ref 12.0–15.0)
Immature Granulocytes: 0 %
Lymphocytes Relative: 24 %
Lymphs Abs: 1.7 10*3/uL (ref 0.7–4.0)
MCH: 31.9 pg (ref 26.0–34.0)
MCHC: 32.8 g/dL (ref 30.0–36.0)
MCV: 97.4 fL (ref 80.0–100.0)
Monocytes Absolute: 0.6 10*3/uL (ref 0.1–1.0)
Monocytes Relative: 8 %
Neutro Abs: 4.9 10*3/uL (ref 1.7–7.7)
Neutrophils Relative %: 67 %
Platelets: 127 10*3/uL — ABNORMAL LOW (ref 150–400)
RBC: 2.35 MIL/uL — ABNORMAL LOW (ref 3.87–5.11)
RDW: 17.4 % — ABNORMAL HIGH (ref 11.5–15.5)
WBC: 7.3 10*3/uL (ref 4.0–10.5)
nRBC: 0 % (ref 0.0–0.2)

## 2021-01-03 LAB — BASIC METABOLIC PANEL
Anion gap: 5 (ref 5–15)
BUN: 24 mg/dL — ABNORMAL HIGH (ref 8–23)
CO2: 28 mmol/L (ref 22–32)
Calcium: 8.3 mg/dL — ABNORMAL LOW (ref 8.9–10.3)
Chloride: 101 mmol/L (ref 98–111)
Creatinine, Ser: 2.19 mg/dL — ABNORMAL HIGH (ref 0.44–1.00)
GFR, Estimated: 21 mL/min — ABNORMAL LOW (ref >60)
Glucose, Bld: 98 mg/dL (ref 70–99)
Potassium: 3.8 mmol/L (ref 3.5–5.1)
Sodium: 134 mmol/L — ABNORMAL LOW (ref 135–145)

## 2021-01-03 LAB — GLUCOSE, CAPILLARY
Glucose-Capillary: 121 mg/dL — ABNORMAL HIGH (ref 70–99)
Glucose-Capillary: 101 mg/dL — ABNORMAL HIGH (ref 70–99)
Glucose-Capillary: 112 mg/dL — ABNORMAL HIGH (ref 70–99)

## 2021-01-03 NOTE — Progress Notes (Signed)
PROGRESS NOTE    CRISTI West  BTY:[id number] DOB: Jan 02, 1930 DOA: 12/30/2020 PCP: Lucianne Lei, MD   Brief Narrative:   [name] a 87 y.o.femalewith medical history significant ofDM2, ESRD, HTN, MM, SSS bradycardia s/p PPM, atrial flutter presented to the hospital with shortness of breath. Patient was recently seen by cardiology as outpatient and was scheduled for cardioversion next week due to shortness of breath which was thought to be secondary to atrial flutter. She did have partial dialysis session and was brought into the hospital for evaluation of shortness of breath. Covid test was negative. Troponins were mildly elevated about baseline. Chest x-ray showed bilateral pleural effusion. Patient required 4 L of oxygen initially in the ED. Patient was then admitted to hospital for further evaluation and treatment  Hospital course: Patient presented to the hospital with acute respiratory failure secondary to hypoxia.  This was attributed to decompensated heart failure in the setting of atypical a flutter.  She was successfully weaned off oxygen and was seen by cardiology as well as nephrology for end-stage renal disease on hemodialysis  Initially cardiology plans on doing inpatient cardioversion on Monday but late Sunday afternoon elected to pursue that as an outpatient.  Patient did report still feeling mildly short of breath on Sunday and was resistant to discharge home but agreed  respiratory status was stable she should be able to go home Monday  Patient was also seen by nephrology while in the hospital for end-stage renal disease and will continue dialysis on a TTS schedule.  While in the hospital patient's electrolytes were monitored without complication.  While in hospital we continued treatment of patient's diabetes.  She was on sliding scale insulin to continue her home medications on discharge as well as diabetic diet.  Finally continue Decadron and  allopurinol for patient's multiple myeloma.   Assessment & Plan:   Principal Problem:   Acute respiratory failure with hypoxia (HCC) Active Problems:   Multiple myeloma not having achieved remission (HCC)   ESRD (end stage renal disease) on dialysis (HCC)   Atypical atrial flutter (HCC)   Fluid overload   Protein-calorie malnutrition, severe   Atrial flutter (HCC)   Acute resp failure with hypoxia -Likely secondary to decompensated heart failure in the setting of atypical atrial flutter.Weaned off4 L of oxygen by nasal cannula. Cardiology has been consulted. Nephrology on board as well. No signs of infection such as pneumonia. Continue dialysis as per nephrology tomorrow. -Change to renal diet  Persistent atypical atrial flutter. Cardiology has been consulted. Continue Eliquis. There was plan for cardioversion as outpatient.Cardiology evaluating for TEE/DCCV on 3/23 since nothing is available earlier on the schedule.  Worsening cardiomyopathy Echo with LVEF 25-30% compared to 40-45% prior Large pleural effusion with small pericardial effusion  Diabetes mellitus type II. On glimepiride at home. Hold OHA. We will add sliding scale insulin.. Continue diabetic diet, Accu-Cheks.  ESRDon hemodialysis.- Hemodialysis as per nephrology. Nephrology on board.  Multiple myeloma. Cont decadron,allopurinol.  Severe protein calorie malnutrition.Present on admission. Evidence for significant weight loss, severe fat depletion, seen by dietitian. Continue nutritional supplements.   DVT prophylaxis:Eliquis Code Status: Full Family Communication: Tried calling son na 3/21 Disposition Plan:  Status is: Inpatient  Remains inpatient appropriate because:Ongoing diagnostic testing needed not appropriate for outpatient work up, IV treatments appropriate due to intensity of illness or inability to take PO and Inpatient level of care appropriate due to severity of  illness   Dispo: The patient is from: Home  Anticipated d/c is to: Home              Patient currently is not medically stable to d/c.   Difficult to place patient No  Nutritional Assessment:  The patient's BMI is: Body mass index is 17.24 kg/m.Marland Kitchen  Seen by dietician.  I agree with the assessment and plan as outlined below:  Nutrition Status: Nutrition Problem: Severe Malnutrition Etiology: chronic illness (ESRD on HD) Signs/Symptoms: percent weight loss,severe fat depletion,severe muscle depletion,per patient/family report Percent weight loss: 5 % Interventions: Nepro shake,MVI,Liberalize Diet   Consultants:   Cardiology  Nephrology  Procedures:   See below  Antimicrobials:   None   Subjective: Patient seen and evaluated today with ongoing shortness of breath, but no palpitations or chest pain.   Objective: Vitals:   01/02/21 1629 01/02/21 1958 01/03/21 0553 01/03/21 0936  BP: 108/66 122/63 105/62 (!) 104/53  Pulse: 79 80 80 80  Resp: _0 Temp: 98.4 F (36.9 C) 98.3 F (36.8 C) 97.8 F (36.6 C) 97.6 F (36.4 C)  TempSrc:  Oral  Oral  SpO2: 99% 99% 99% 100%  Weight:      Height:        Intake/Output Summary (Last 24 hours) at 01/03/2021 1543 Last data filed at 01/03/2021 0900 Gross per 24 hour  Intake 700 ml  Output 275 ml  Net 425 ml   Filed Weights   12/30/20 2305 12/31/20 1328  Weight: 46.4 kg 47 kg    Examination:  General exam: Appears calm and comfortable  Respiratory system: Clear to auscultation. Respiratory effort normal. Cardiovascular system: S1 & S2 heard, RRR.  Gastrointestinal system: Abdomen is soft Central nervous system: Alert and awake Extremities: No edema Skin: No significant lesions noted Psychiatry: Flat affect.    Data Reviewed: I have personally reviewed following labs and imaging studies  CBC: Recent Labs  Lab 12/30/20 1547 12/30/20 2303 12/31/20 0236 01/01/21 0934 01/02/21 0559  01/03/21 1102  WBC 5.7 7.0 5.8 5.7 5.5 7.3  NEUTROABS 3.5  --   --  4.3 3.8 4.9  HGB 9.7* 8.1* 7.2* 8.8* 7.1* 7.5*  HCT 28.6* 22.6* 20.7* 25.5* 20.7* 22.9*  MCV 93.5 91.9 92.8 94.1 94.5 97.4  PLT 181 135* 128* 122* 112* 016*   Basic Metabolic Panel: Recent Labs  Lab 12/30/20 1547 12/30/20 2303 12/31/20 0236 01/01/21 0934 01/02/21 0559 01/02/21 1458 01/03/21 1102  NA 136 135 135 134* 135  --  134*  K 3.3* 3.5 3.4* 4.0 2.9* 4.0 3.8  CL 96* 97* 97* 97* 98  --  101  CO2 _1 --  28  GLUCOSE 80 95 86 122* 110*  --  98  BUN _2 6*  --  24*  CREATININE 1.72* 2.03* 2.10* 1.98* 1.12*  --  2.19*  CALCIUM 9.1 8.3* 8.1* 8.4* 8.1*  --  8.3*  MG 2.0  --   --   --   --   --   --   PHOS  --  2.6  --   --   --   --   --    GFR: Estimated Creatinine Clearance: 13.2 mL/min (A) (by C-G formula based on SCr of 2.19 mg/dL (H)). Liver Function Tests: Recent Labs  Lab 12/30/20 1547 12/30/20 2303  AST 28  --   ALT 18  --   ALKPHOS 44  --   BILITOT 1.7*  --   PROT 6.0*  --  ALBUMIN 4.0 3.1*   No results for input(s): LIPASE, AMYLASE in the last 168 hours. No results for input(s): AMMONIA in the last 168 hours. Coagulation Profile: No results for input(s): INR, PROTIME in the last 168 hours. Cardiac Enzymes: No results for input(s): CKTOTAL, CKMB, CKMBINDEX, TROPONINI in the last 168 hours. BNP (last 3 results) No results for input(s): PROBNP in the last 8760 hours. HbA1C: No results for input(s): HGBA1C in the last 72 hours. CBG: Recent Labs  Lab 01/02/21 1140 01/02/21 1620 01/02/21 2115 01/03/21 0811 01/03/21 1130  GLUCAP 81 137* 99 121* 101*   Lipid Profile: No results for input(s): CHOL, HDL, LDLCALC, TRIG, CHOLHDL, LDLDIRECT in the last 72 hours. Thyroid Function Tests: No results for input(s): TSH, T4TOTAL, FREET4, T3FREE, THYROIDAB in the last 72 hours. Anemia Panel: No results for input(s): VITAMINB12, FOLATE, FERRITIN, TIBC, IRON, RETICCTPCT in the  last 72 hours. Sepsis Labs: Recent Labs  Lab 12/30/20 1545 12/30/20 1740  LATICACIDVEN 2.0* 1.4    Recent Results (from the past 240 hour(s))  Resp Panel by RT-PCR (Flu A&B, Covid) Nasopharyngeal Swab     Status: None   Collection Time: 12/30/20  3:28 PM   Specimen: Nasopharyngeal Swab; Nasopharyngeal(NP) swabs in vial transport medium  Result Value Ref Range Status   SARS Coronavirus 2 by RT PCR NEGATIVE NEGATIVE Final    Comment: (NOTE) SARS-CoV-2 target nucleic acids are NOT DETECTED.  The SARS-CoV-2 RNA is generally detectable in upper respiratory specimens during the acute phase of infection. The lowest concentration of SARS-CoV-2 viral copies this assay can detect is 138 copies/mL. A negative result does not preclude SARS-Cov-2 infection and should not be used as the sole basis for treatment or other patient management decisions. A negative result may occur with  improper specimen collection/handling, submission of specimen other than nasopharyngeal swab, presence of viral mutation(s) within the areas targeted by this assay, and inadequate number of viral copies(<138 copies/mL). A negative result must be combined with clinical observations, patient history, and epidemiological information. The expected result is Negative.  Fact Sheet for Patients:  EntrepreneurPulse.com.au  Fact Sheet for Healthcare Providers:  IncredibleEmployment.be  This test is no t yet approved or cleared by the [place] FDA and  has been authorized for detection and/or diagnosis of SARS-CoV-2 by FDA under an Emergency Use Authorization (EUA). This EUA will remain  in effect (meaning this test can be used) for the duration of the COVID-19 declaration under Section 564(b)(1) of the Act, 21 U.S.C.section 360bbb-3(b)(1), unless the authorization is terminated  or revoked sooner.       Influenza A by PCR NEGATIVE NEGATIVE Final   Influenza B by PCR  NEGATIVE NEGATIVE Final    Comment: (NOTE) The Xpert Xpress SARS-CoV-2/FLU/RSV plus assay is intended as an aid in the diagnosis of influenza from Nasopharyngeal swab specimens and should not be used as a sole basis for treatment. Nasal washings and aspirates are unacceptable for Xpert Xpress SARS-CoV-2/FLU/RSV testing.  Fact Sheet for Patients: EntrepreneurPulse.com.au  Fact Sheet for Healthcare Providers: IncredibleEmployment.be  This test is not yet approved or cleared by the [place] FDA and has been authorized for detection and/or diagnosis of SARS-CoV-2 by FDA under an Emergency Use Authorization (EUA). This EUA will remain in effect (meaning this test can be used) for the duration of the COVID-19 declaration under Section 564(b)(1) of the Act, 21 U.S.C. section 360bbb-3(b)(1), unless the authorization is terminated or revoked.  Performed at [facility], [place]  [address] [place], [place] [address]   MRSA PCR Screening     Status: None   Collection Time: 12/31/20  6:53 AM   Specimen: Nasopharyngeal  Result Value Ref Range Status   MRSA by PCR NEGATIVE NEGATIVE Final    Comment:        The GeneXpert MRSA Assay (FDA approved for NASAL specimens only), is one component of a comprehensive MRSA colonization surveillance program. It is not intended to diagnose MRSA infection nor to guide or monitor treatment for MRSA infections. Performed at [facility], [place] [address], [place], [place] [address]          Radiology Studies: DG CHEST PORT 1 VIEW  Result Date: 01/03/2021 CLINICAL DATA:  Shortness of breath EXAM: PORTABLE CHEST 1 VIEW COMPARISON:  December 30, 2020. FINDINGS: There is ill-defined airspace opacity in the left base with small left pleural effusion. The right lung is clear. No appreciable interstitial edema. There is cardiomegaly with pulmonary vascularity within normal limits. Pacemaker leads attached to  right atrium right ventricle. There is aortic atherosclerosis. Bones are osteoporotic. There is arthropathy in each shoulder. Skin fold noted on the right. IMPRESSION: Ill-defined opacity concerning for pneumonia left base with small left pleural effusion. Right lung clear. Cardiomegaly with pacemaker leads attached to right atrium and right ventricle, stable. Aortic Atherosclerosis (ICD10-I70.0). Electronically Signed   By: Lowella Grip III M.D.   On: 01/03/2021 08:03   ECHOCARDIOGRAM COMPLETE  Result Date: 01/02/2021    ECHOCARDIOGRAM REPORT   Patient Name:   CHARELLE PETRAKIS Date of Exam: 01/01/2021 Medical Rec #:  [id number]        Height:       65.0 in Accession #:    [id number]       Weight:       103.6 lb Date of Birth:  1930/09/14       BSA:          1.496 m Patient Age:    31 years         BP:           104/55 mmHg Patient Gender: F                HR:           78 bpm. Exam Location:  Inpatient Procedure: 2D Echo, Cardiac Doppler and Color Doppler Indications:     I50.9* Heart failure (unspecified)  History:         Patient has prior history of Echocardiogram examinations, most                  recent 04/02/2020. Pacemaker; Arrythmias:Atrial Flutter. H/O                  Breast cancer.  Sonographer:     [name] Referring Phys:  [id number] [facility] A Gasper Sells Diagnosing Phys: Ena Dawley MD IMPRESSIONS  1. When compared to the prior study from 04/02/2021 LVEF is significantly worse with diffuse hypokinesis and severe dyssynchrony secondary to RV pacing. LVEF 25-30%, previously 40-45%.  2. Left ventricular ejection fraction, by estimation, is 25 to 30%. The left ventricle has severely decreased function. The left ventricle demonstrates global hypokinesis. There is moderate concentric left ventricular hypertrophy. Left ventricular diastolic parameters are consistent with Grade II diastolic dysfunction (pseudonormalization). Elevated left atrial pressure.  3. Right ventricular systolic function is  normal. The right ventricular size is normal. There is severely elevated pulmonary artery systolic pressure. The estimated right ventricular  systolic pressure is 86.7 mmHg.  4. Left atrial size was moderately dilated.  5. Right atrial size was mildly dilated.  6. A small pericardial effusion is present. The pericardial effusion is circumferential. Large pleural effusion in the left lateral region.  7. The mitral valve is normal in structure. Moderate mitral valve regurgitation. No evidence of mitral stenosis.  8. Tricuspid valve regurgitation is severe.  9. The aortic valve is normal in structure. Aortic valve regurgitation is not visualized. Mild to moderate aortic valve sclerosis/calcification is present, without any evidence of aortic stenosis. Aortic valve mean gradient measures 8.0 mmHg. 10. Mild pulmonic stenosis. 11. The inferior vena cava is dilated in size with <50% respiratory variability, suggesting right atrial pressure of 15 mmHg. FINDINGS  Left Ventricle: Left ventricular ejection fraction, by estimation, is 25 to 30%. The left ventricle has severely decreased function. The left ventricle demonstrates global hypokinesis. The left ventricular internal cavity size was normal in size. There is moderate concentric left ventricular hypertrophy. Abnormal (paradoxical) septal motion, consistent with RV pacemaker. Left ventricular diastolic parameters are consistent with Grade II diastolic dysfunction (pseudonormalization). Elevated left atrial pressure. Right Ventricle: The right ventricular size is normal. No increase in right ventricular wall thickness. Right ventricular systolic function is normal. There is severely elevated pulmonary artery systolic pressure. The tricuspid regurgitant velocity is 3.54 m/s, and with an assumed right atrial pressure of 15 mmHg, the estimated right ventricular systolic pressure is 61.9 mmHg. Left Atrium: Left atrial size was moderately dilated. Right Atrium: Right atrial  size was mildly dilated. Pericardium: A small pericardial effusion is present. The pericardial effusion is circumferential. Mitral Valve: The mitral valve is normal in structure. Moderate mitral valve regurgitation. No evidence of mitral valve stenosis. Tricuspid Valve: The tricuspid valve is normal in structure. Tricuspid valve regurgitation is severe. No evidence of tricuspid stenosis. Aortic Valve: The aortic valve is normal in structure. Aortic valve regurgitation is not visualized. Mild to moderate aortic valve sclerosis/calcification is present, without any evidence of aortic stenosis. Aortic valve mean gradient measures 8.0 mmHg. Aortic valve peak gradient measures 14.6 mmHg. Aortic valve area, by VTI measures 1.24 cm. Pulmonic Valve: The pulmonic valve was normal in structure. Pulmonic valve regurgitation is not visualized. Mild pulmonic stenosis. Aorta: The aortic root is normal in size and structure. Venous: The inferior vena cava is dilated in size with less than 50% respiratory variability, suggesting right atrial pressure of 15 mmHg. IAS/Shunts: No atrial level shunt detected by color flow Doppler. Additional Comments: A device lead is visualized in the right ventricle and right atrium. There is a large pleural effusion in the left lateral region.  LEFT VENTRICLE PLAX 2D LVIDd:         4.60 cm      Diastology LVIDs:         4.00 cm      LV e' lateral: 7.07 cm/s LV PW:         1.40 cm LV IVS:        1.40 cm LVOT diam:     2.00 cm LV SV:         43 LV SV Index:   29 LVOT Area:     3.14 cm  LV Volumes (MOD) LV vol d, MOD A2C: 104.0 ml LV vol d, MOD A4C: 148.0 ml LV vol s, MOD A2C: 68.6 ml LV vol s, MOD A4C: 135.0 ml LV SV MOD A2C:     35.4 ml LV SV MOD A4C:  148.0 ml LV SV MOD BP:      24.5 ml RIGHT VENTRICLE            IVC RV Basal diam:  3.70 cm    IVC diam: 1.90 cm RV S prime:     8.81 cm/s TAPSE (M-mode): 1.4 cm LEFT ATRIUM              Index       RIGHT ATRIUM           Index LA diam:        3.90  cm  2.61 cm/m  RA Area:     25.30 cm LA Vol (A2C):   111.0 ml 74.21 ml/m RA Volume:   76.70 ml  51.28 ml/m LA Vol (A4C):   78.5 ml  52.48 ml/m LA Biplane Vol: 96.2 ml  64.31 ml/m  AORTIC VALVE AV Area (Vmax):    1.29 cm AV Area (Vmean):   1.27 cm AV Area (VTI):     1.24 cm AV Vmax:           191.00 cm/s AV Vmean:          128.000 cm/s AV VTI:            0.347 m AV Peak Grad:      14.6 mmHg AV Mean Grad:      8.0 mmHg LVOT Vmax:         78.30 cm/s LVOT Vmean:        51.800 cm/s LVOT VTI:          0.137 m LVOT/AV VTI ratio: 0.39  AORTA Ao Root diam: 3.30 cm Ao Asc diam:  2.80 cm TRICUSPID VALVE TR Peak grad:   50.1 mmHg TR Vmax:        354.00 cm/s  SHUNTS Systemic VTI:  0.14 m Systemic Diam: 2.00 cm Ena Dawley MD Electronically signed by Ena Dawley MD Signature Date/Time: 01/02/2021/12:21:23 PM    Final (Updated)         Scheduled Meds: . (feeding supplement) PROSource Plus  30 mL Oral TID BM  . acyclovir  400 mg Oral Daily  . allopurinol  50 mg Oral Daily  . apixaban  2.5 mg Oral BID  . Chlorhexidine Gluconate Cloth  6 each Topical Q0600  . cholecalciferol  2,000 Units Oral Daily  . [START ON 01/08/2021] darbepoetin (ARANESP) injection - DIALYSIS  50 mcg Intravenous Q Sat-HD  . dexamethasone  2 mg Oral Daily  . dorzolamide  1 drop Left Eye BID  . [START ON 01/04/2021] doxercalciferol  1 mcg Intravenous Once per day on Tue Thu  . feeding supplement (NEPRO CARB STEADY)  237 mL Oral BID BM  . fluorometholone  1 drop Right Eye Daily  . insulin aspart  0-6 Units Subcutaneous TID WC  . lactulose  10 g Oral TID  . latanoprost  1 drop Left Eye QHS  . metoCLOPramide  5 mg Oral TID AC  . midodrine  10 mg Oral Q T,Th,Sat-1800  . multivitamin  1 tablet Oral QHS  . polyethylene glycol  17 g Oral Daily  . pregabalin  50 mg Oral QHS  . senna-docusate  2 tablet Oral BID    LOS: 3 days    Time spent: 35 minutes    Pratik Darleen Crocker, DO [facility]  If 7PM-7AM, please contact  night-coverage [email or web address] 01/03/2021, 3:43 PM

## 2021-01-03 NOTE — Plan of Care (Signed)
  Problem: Health Behavior/Discharge Planning: Goal: Ability to manage health-related needs will improve Outcome: Progressing   Problem: Clinical Measurements: Goal: Will remain free from infection Outcome: Progressing   Problem: Activity: Goal: Risk for activity intolerance will decrease Outcome: Progressing   Problem: Nutrition: Goal: Adequate nutrition will be maintained Outcome: Progressing   Problem: Elimination: Goal: Will not experience complications related to bowel motility Outcome: Progressing   Problem: Pain Managment: Goal: General experience of comfort will improve Outcome: Progressing

## 2021-01-03 NOTE — Progress Notes (Signed)
Subjective: Seen in room, had no complaints, for dialysis tomorrow normal schedule, pending plans by cardiology  for possible cardioversion  Objective Vital signs in last 24 hours: Vitals:   01/02/21 1629 01/02/21 1958 01/03/21 0553 01/03/21 0936  BP: 108/66 122/63 105/62 (!) 104/53  Pulse: 79 80 80 80  Resp: 18 18 16 18   Temp: 98.4 F (36.9 C) 98.3 F (36.8 C) 97.8 F (36.6 C) 97.6 F (36.4 C)  TempSrc:  Oral  Oral  SpO2: 99% 99% 99% 100%  Weight:      Height:       Weight change:   Physical Exam: General: Alert elderly thin female NAD Heart: Rare irregular on exam no MRG Lungs: CTA nonlabored breathing Abdomen: Bowel sounds normoactive, soft, NTND Extremities: Trace bipedal edema Dialysis Access: Positive bruit right arm AV fistula  OP dialysis Orders:  [place]. TTS. 4hrs,3K,2.5Ca, EDW49 kg  heparin 2k bolus Hectorol 1 mic Mircera 50 MCG 2 weeks not yet given at outpatient center  right upper arm AV fistula   Problem/Plan  1.Acute Respiratory Failure: Volume overload and related  A flutter, UF with HD  .  2kg below edw   , cardiology following in hospital patient is scheduled for Cardioversion ??  In hospital 2. ESRD- HD TTS: HD with UF 521ml.  Next HD on 01/04/21. K+ 4.0 use 3K bath tomorrow.. 3. HTN/volume-Patient's volume status is improving this a.m. BP systolic 287/OM midodrine for BP support for  HD, lower dry weight at time of discharge 4. Anemiaof CKD-Hgb trending downward; now at 7.5< 7.1.Tsat 37, Iron 63, and ferritin 1226. Iron series not indicated at this time.   Give Aranesp 44mcg weekly Tuesday next dialysis. 5 Secondary hyperparathyroidism-  Corrected Ca at 8.9. ,, Phos 2.6 no binder, on  Hectorol 8mcg twice weekly. Will continu potassium and okay phosphorus but e to monitor trends.  6. Nutrition-ALB 3.1 , on regular  Diet   , ,okay with low K and low phosphorus , needs fluid restrictions ,add Prosourse protein supplement.  Ernest Haber, PA-C [facility] Beeper [phone] 01/03/2021,11:34 AM  LOS: 3 days   Labs: Basic Metabolic Panel: Recent Labs  Lab 12/30/20 2303 12/31/20 0236 01/01/21 0934 01/02/21 0559 01/02/21 1458  NA 135 135 134* 135  --   K 3.5 3.4* 4.0 2.9* 4.0  CL 97* 97* 97* 98  --   CO2 30 31 30 31   --   GLUCOSE 95 86 122* 110*  --   BUN 11 12 8  6*  --   CREATININE 2.03* 2.10* 1.98* 1.12*  --   CALCIUM 8.3* 8.1* 8.4* 8.1*  --   PHOS 2.6  --   --   --   --    Liver Function Tests: Recent Labs  Lab 12/30/20 1547 12/30/20 2303  AST 28  --   ALT 18  --   ALKPHOS 44  --   BILITOT 1.7*  --   PROT 6.0*  --   ALBUMIN 4.0 3.1*   No results for input(s): LIPASE, AMYLASE in the last 168 hours. No results for input(s): AMMONIA in the last 168 hours. CBC: Recent Labs  Lab 12/30/20 1547 12/30/20 2303 12/31/20 0236 01/01/21 0934 01/02/21 0559  WBC 5.7 7.0 5.8 5.7 5.5  NEUTROABS 3.5  --   --  4.3 3.8  HGB 9.7* 8.1* 7.2* 8.8* 7.1*  HCT 28.6* 22.6* 20.7* 25.5* 20.7*  MCV 93.5 91.9 92.8 94.1 94.5  PLT 181 135* 128*  122* 112*   Cardiac Enzymes: No results for input(s): CKTOTAL, CKMB, CKMBINDEX, TROPONINI in the last 168 hours. CBG: Recent Labs  Lab 01/02/21 0717 01/02/21 1140 01/02/21 1620 01/02/21 2115 01/03/21 0811  GLUCAP 120* 81 137* 99 121*    Studies/Results: DG CHEST PORT 1 VIEW  Result Date: 01/03/2021 CLINICAL DATA:  Shortness of breath EXAM: PORTABLE CHEST 1 VIEW COMPARISON:  December 30, 2020. FINDINGS: There is ill-defined airspace opacity in the left base with small left pleural effusion. The right lung is clear. No appreciable interstitial edema. There is cardiomegaly with pulmonary vascularity within normal limits. Pacemaker leads attached to right atrium right ventricle. There is aortic atherosclerosis. Bones are osteoporotic. There is arthropathy in each shoulder. Skin fold noted on the right. IMPRESSION: Ill-defined opacity concerning for pneumonia left  base with small left pleural effusion. Right lung clear. Cardiomegaly with pacemaker leads attached to right atrium and right ventricle, stable. Aortic Atherosclerosis (ICD10-I70.0). Electronically Signed   By: Lowella Grip III M.D.   On: 01/03/2021 08:03   ECHOCARDIOGRAM COMPLETE  Result Date: 01/02/2021    ECHOCARDIOGRAM REPORT   Patient Name:   DELONDA COLEY Date of Exam: 01/01/2021 Medical Rec #:  [id number]        Height:       65.0 in Accession #:    [id number]       Weight:       103.6 lb Date of Birth:  03/19/1930       BSA:          1.496 m Patient Age:    87 years         BP:           104/55 mmHg Patient Gender: F                HR:           78 bpm. Exam Location:  Inpatient Procedure: 2D Echo, Cardiac Doppler and Color Doppler Indications:     I50.9* Heart failure (unspecified)  History:         Patient has prior history of Echocardiogram examinations, most                  recent 04/02/2020. Pacemaker; Arrythmias:Atrial Flutter. H/O                  Breast cancer.  Sonographer:     [name] Referring Phys:  [id number] [facility] A Gas[facility] Sells Diagnosing Phys: Ena Dawley MD IMPRESSIONS  1. When compared to the prior study from 04/02/2021 LVEF is significantly worse with diffuse hypokinesis and severe dyssynchrony secondary to RV pacing. LVEF 25-30%, previously 40-45%.  2. Left ventricular ejection fraction, by estimation, is 25 to 30%. The left ventricle has severely decreased function. The left ventricle demonstrates global hypokinesis. There is moderate concentric left ventricular hypertrophy. Left ventricular diastolic parameters are consistent with Grade II diastolic dysfunction (pseudonormalization). Elevated left atrial pressure.  3. Right ventricular systolic function is normal. The right ventricular size is normal. There is severely elevated pulmonary artery systolic pressure. The estimated right ventricular systolic pressure is 38.7 mmHg.  4. Left atrial size was moderately dilated.   5. Right atrial size was mildly dilated.  6. A small pericardial effusion is present. The pericardial effusion is circumferential. Large pleural effusion in the left lateral region.  7. The mitral valve is normal in structure. Moderate mitral valve regurgitation. No evidence of mitral stenosis.  8. Tricuspid valve regurgitation is severe.  9. The aortic valve is normal in structure. Aortic valve regurgitation is not visualized. Mild to moderate aortic valve sclerosis/calcification is present, without any evidence of aortic stenosis. Aortic valve mean gradient measures 8.0 mmHg. 10. Mild pulmonic stenosis. 11. The inferior vena cava is dilated in size with <50% respiratory variability, suggesting right atrial pressure of 15 mmHg. FINDINGS  Left Ventricle: Left ventricular ejection fraction, by estimation, is 25 to 30%. The left ventricle has severely decreased function. The left ventricle demonstrates global hypokinesis. The left ventricular internal cavity size was normal in size. There is moderate concentric left ventricular hypertrophy. Abnormal (paradoxical) septal motion, consistent with RV pacemaker. Left ventricular diastolic parameters are consistent with Grade II diastolic dysfunction (pseudonormalization). Elevated left atrial pressure. Right Ventricle: The right ventricular size is normal. No increase in right ventricular wall thickness. Right ventricular systolic function is normal. There is severely elevated pulmonary artery systolic pressure. The tricuspid regurgitant velocity is 3.54 m/s, and with an assumed right atrial pressure of 15 mmHg, the estimated right ventricular systolic pressure is 36.6 mmHg. Left Atrium: Left atrial size was moderately dilated. Right Atrium: Right atrial size was mildly dilated. Pericardium: A small pericardial effusion is present. The pericardial effusion is circumferential. Mitral Valve: The mitral valve is normal in structure. Moderate mitral valve regurgitation. No  evidence of mitral valve stenosis. Tricuspid Valve: The tricuspid valve is normal in structure. Tricuspid valve regurgitation is severe. No evidence of tricuspid stenosis. Aortic Valve: The aortic valve is normal in structure. Aortic valve regurgitation is not visualized. Mild to moderate aortic valve sclerosis/calcification is present, without any evidence of aortic stenosis. Aortic valve mean gradient measures 8.0 mmHg. Aortic valve peak gradient measures 14.6 mmHg. Aortic valve area, by VTI measures 1.24 cm. Pulmonic Valve: The pulmonic valve was normal in structure. Pulmonic valve regurgitation is not visualized. Mild pulmonic stenosis. Aorta: The aortic root is normal in size and structure. Venous: The inferior vena cava is dilated in size with less than 50% respiratory variability, suggesting right atrial pressure of 15 mmHg. IAS/Shunts: No atrial level shunt detected by color flow Doppler. Additional Comments: A device lead is visualized in the right ventricle and right atrium. There is a large pleural effusion in the left lateral region.  LEFT VENTRICLE PLAX 2D LVIDd:         4.60 cm      Diastology LVIDs:         4.00 cm      LV e' lateral: 7.07 cm/s LV PW:         1.40 cm LV IVS:        1.40 cm LVOT diam:     2.00 cm LV SV:         43 LV SV Index:   29 LVOT Area:     3.14 cm  LV Volumes (MOD) LV vol d, MOD A2C: 104.0 ml LV vol d, MOD A4C: 148.0 ml LV vol s, MOD A2C: 68.6 ml LV vol s, MOD A4C: 135.0 ml LV SV MOD A2C:     35.4 ml LV SV MOD A4C:     148.0 ml LV SV MOD BP:      24.5 ml RIGHT VENTRICLE            IVC RV Basal diam:  3.70 cm    IVC diam: 1.90 cm RV S prime:     8.81 cm/s TAPSE (M-mode): 1.4 cm LEFT ATRIUM  Index       RIGHT ATRIUM           Index LA diam:        3.90 cm  2.61 cm/m  RA Area:     25.30 cm LA Vol (A2C):   111.0 ml 74.21 ml/m RA Volume:   76.70 ml  51.28 ml/m LA Vol (A4C):   78.5 ml  52.48 ml/m LA Biplane Vol: 96.2 ml  64.31 ml/m  AORTIC VALVE AV Area (Vmax):     1.29 cm AV Area (Vmean):   1.27 cm AV Area (VTI):     1.24 cm AV Vmax:           191.00 cm/s AV Vmean:          128.000 cm/s AV VTI:            0.347 m AV Peak Grad:      14.6 mmHg AV Mean Grad:      8.0 mmHg LVOT Vmax:         78.30 cm/s LVOT Vmean:        51.800 cm/s LVOT VTI:          0.137 m LVOT/AV VTI ratio: 0.39  AORTA Ao Root diam: 3.30 cm Ao Asc diam:  2.80 cm TRICUSPID VALVE TR Peak grad:   50.1 mmHg TR Vmax:        354.00 cm/s  SHUNTS Systemic VTI:  0.14 m Systemic Diam: 2.00 cm Ena Dawley MD Electronically signed by Ena Dawley MD Signature Date/Time: 01/02/2021/12:21:23 PM    Final (Updated)    Medications:  . (feeding supplement) PROSource Plus  30 mL Oral TID BM  . acyclovir  400 mg Oral Daily  . allopurinol  50 mg Oral Daily  . apixaban  2.5 mg Oral BID  . Chlorhexidine Gluconate Cloth  6 each Topical Q0600  . cholecalciferol  2,000 Units Oral Daily  . [START ON 01/08/2021] darbepoetin (ARANESP) injection - DIALYSIS  50 mcg Intravenous Q Sat-HD  . dexamethasone  2 mg Oral Daily  . dorzolamide  1 drop Left Eye BID  . [START ON 01/04/2021] doxercalciferol  1 mcg Intravenous Once per day on Tue Thu  . feeding supplement (NEPRO CARB STEADY)  237 mL Oral BID BM  . fluorometholone  1 drop Right Eye Daily  . insulin aspart  0-6 Units Subcutaneous TID WC  . lactulose  10 g Oral TID  . latanoprost  1 drop Left Eye QHS  . metoCLOPramide  5 mg Oral TID AC  . midodrine  10 mg Oral Q T,Th,Sat-1800  . multivitamin  1 tablet Oral QHS  . polyethylene glycol  17 g Oral Daily  . pregabalin  50 mg Oral QHS  . senna-docusate  2 tablet Oral BID

## 2021-01-03 NOTE — Progress Notes (Signed)
Progress Note  Patient Name: [name] Date of Encounter: 01/03/2021  [facility] Cardiologist: Jenkins Rouge, MD   Subjective   Feels "ok" at rest but has periodic resting shortness of breath. Does have significant dyspnea on exertion.   Inpatient Medications    Scheduled Meds: . (feeding supplement) PROSource Plus  30 mL Oral TID BM  . acyclovir  400 mg Oral Daily  . allopurinol  50 mg Oral Daily  . apixaban  2.5 mg Oral BID  . Chlorhexidine Gluconate Cloth  6 each Topical Q0600  . cholecalciferol  2,000 Units Oral Daily  . [START ON 01/08/2021] darbepoetin (ARANESP) injection - DIALYSIS  50 mcg Intravenous Q Sat-HD  . dexamethasone  2 mg Oral Daily  . dorzolamide  1 drop Left Eye BID  . [START ON 01/04/2021] doxercalciferol  1 mcg Intravenous Once per day on Tue Thu  . feeding supplement (NEPRO CARB STEADY)  237 mL Oral BID BM  . fluorometholone  1 drop Right Eye Daily  . insulin aspart  0-6 Units Subcutaneous TID WC  . lactulose  10 g Oral TID  . latanoprost  1 drop Left Eye QHS  . metoCLOPramide  5 mg Oral TID AC  . midodrine  10 mg Oral Q T,Th,Sat-1800  . multivitamin  1 tablet Oral QHS  . polyethylene glycol  17 g Oral Daily  . pregabalin  50 mg Oral QHS  . senna-docusate  2 tablet Oral BID   Continuous Infusions:  PRN Meds: acetaminophen **OR** acetaminophen, lidocaine-prilocaine, ondansetron **OR** ondansetron (ZOFRAN) IV, oxyCODONE, pantoprazole   Vital Signs    Vitals:   01/02/21 1629 01/02/21 1958 01/03/21 0553 01/03/21 0936  BP: 108/66 122/63 105/62 (!) 104/53  Pulse: 79 80 80 80  Resp: 18 18 16 18   Temp: 98.4 F (36.9 C) 98.3 F (36.8 C) 97.8 F (36.6 C) 97.6 F (36.4 C)  TempSrc:  Oral  Oral  SpO2: 99% 99% 99% 100%  Weight:      Height:        Intake/Output Summary (Last 24 hours) at 01/03/2021 1526 Last data filed at 01/03/2021 0900 Gross per 24 hour  Intake 700 ml  Output 275 ml  Net 425 ml   Last 3 Weights 12/31/2020 12/30/2020  12/29/2020  Weight (lbs) 103 lb 9.9 oz 102 lb 4.7 oz 105 lb  Weight (kg) 47 kg 46.4 kg 47.628 kg      Telemetry    Atrial flutter with controlled ventricular rate, frequently paced - Personally Reviewed  ECG    No new since 01/01/21 - Personally Reviewed  Physical Exam   GEN: frail appearing elderly woman, sitting comfortably in chair Neck: JVD at clavicle at 90 degrees Cardiac: regular S1 and S2, 3/6 SM Respiratory: Clear to auscultation in upper fields but diminished at bilateral bases GI: Soft, nontender, non-distended  MS: No LE edema; No deformity. Neuro:  Nonfocal  Psych: Normal affect   Labs    High Sensitivity Troponin:   Recent Labs  Lab 12/30/20 1547 12/30/20 1740  TROPONINIHS 144* 123*      Chemistry Recent Labs  Lab 12/30/20 1547 12/30/20 2303 12/31/20 0236 01/01/21 0934 01/02/21 0559 01/02/21 1458 01/03/21 1102  NA 136 135   < > 134* 135  --  134*  K 3.3* 3.5   < > 4.0 2.9* 4.0 3.8  CL 96* 97*   < > 97* 98  --  101  CO2 30 30   < > 30  31  --  28  GLUCOSE 80 95   < > 122* 110*  --  98  BUN 9 11   < > 8 6*  --  24*  CREATININE 1.72* 2.03*   < > 1.98* 1.12*  --  2.19*  CALCIUM 9.1 8.3*   < > 8.4* 8.1*  --  8.3*  PROT 6.0*  --   --   --   --   --   --   ALBUMIN 4.0 3.1*  --   --   --   --   --   AST 28  --   --   --   --   --   --   ALT 18  --   --   --   --   --   --   ALKPHOS 44  --   --   --   --   --   --   BILITOT 1.7*  --   --   --   --   --   --   GFRNONAA 28* 23*   < > 24* 47*  --  21*  ANIONGAP 10 8   < > 7 6  --  5   < > = values in this interval not displayed.     Hematology Recent Labs  Lab 01/01/21 0934 01/02/21 0559 01/03/21 1102  WBC 5.7 5.5 7.3  RBC 2.71* 2.19* 2.35*  HGB 8.8* 7.1* 7.5*  HCT 25.5* 20.7* 22.9*  MCV 94.1 94.5 97.4  MCH 32.5 32.4 31.9  MCHC 34.5 34.3 32.8  RDW 17.6* 17.4* 17.4*  PLT 122* 112* 127*    BNPNo results for input(s): BNP, PROBNP in the last 168 hours.   DDimer No results for input(s):  DDIMER in the last 168 hours.   Radiology    DG CHEST PORT 1 VIEW  Result Date: 01/03/2021 CLINICAL DATA:  Shortness of breath EXAM: PORTABLE CHEST 1 VIEW COMPARISON:  December 30, 2020. FINDINGS: There is ill-defined airspace opacity in the left base with small left pleural effusion. The right lung is clear. No appreciable interstitial edema. There is cardiomegaly with pulmonary vascularity within normal limits. Pacemaker leads attached to right atrium right ventricle. There is aortic atherosclerosis. Bones are osteoporotic. There is arthropathy in each shoulder. Skin fold noted on the right. IMPRESSION: Ill-defined opacity concerning for pneumonia left base with small left pleural effusion. Right lung clear. Cardiomegaly with pacemaker leads attached to right atrium and right ventricle, stable. Aortic Atherosclerosis (ICD10-I70.0). Electronically Signed   By: Lowella Grip III M.D.   On: 01/03/2021 08:03   ECHOCARDIOGRAM COMPLETE  Result Date: 01/02/2021    ECHOCARDIOGRAM REPORT   Patient Name:   [name] Date of Exam: 01/01/2021 Medical Rec #:  [id number]        Height:       65.0 in Accession #:    [id number]       Weight:       103.6 lb Date of Birth:  [birth year]       BSA:          1.496 m Patient Age:    87 years         BP:           104/55 mmHg Patient Gender: F                HR:           78 bpm. Exam  Location:  Inpatient Procedure: 2D Echo, Cardiac Doppler and Color Doppler Indications:     I50.9* Heart failure (unspecified)  History:         Patient has prior history of Echocardiogram examinations, most                  recent 04/02/2020. Pacemaker; Arrythmias:Atrial Flutter. H/O                  Breast cancer.  Sonographer:     [name] Referring Phys:  [id number] [facility] A Gasper Sells Diagnosing Phys: Ena Dawley MD IMPRESSIONS  1. When compared to the prior study from 04/02/2021 LVEF is significantly worse with diffuse hypokinesis and severe dyssynchrony secondary to RV pacing.  LVEF 25-30%, previously 40-45%.  2. Left ventricular ejection fraction, by estimation, is 25 to 30%. The left ventricle has severely decreased function. The left ventricle demonstrates global hypokinesis. There is moderate concentric left ventricular hypertrophy. Left ventricular diastolic parameters are consistent with Grade II diastolic dysfunction (pseudonormalization). Elevated left atrial pressure.  3. Right ventricular systolic function is normal. The right ventricular size is normal. There is severely elevated pulmonary artery systolic pressure. The estimated right ventricular systolic pressure is 95.2 mmHg.  4. Left atrial size was moderately dilated.  5. Right atrial size was mildly dilated.  6. A small pericardial effusion is present. The pericardial effusion is circumferential. Large pleural effusion in the left lateral region.  7. The mitral valve is normal in structure. Moderate mitral valve regurgitation. No evidence of mitral stenosis.  8. Tricuspid valve regurgitation is severe.  9. The aortic valve is normal in structure. Aortic valve regurgitation is not visualized. Mild to moderate aortic valve sclerosis/calcification is present, without any evidence of aortic stenosis. Aortic valve mean gradient measures 8.0 mmHg. 10. Mild pulmonic stenosis. 11. The inferior vena cava is dilated in size with <50% respiratory variability, suggesting right atrial pressure of 15 mmHg. FINDINGS  Left Ventricle: Left ventricular ejection fraction, by estimation, is 25 to 30%. The left ventricle has severely decreased function. The left ventricle demonstrates global hypokinesis. The left ventricular internal cavity size was normal in size. There is moderate concentric left ventricular hypertrophy. Abnormal (paradoxical) septal motion, consistent with RV pacemaker. Left ventricular diastolic parameters are consistent with Grade II diastolic dysfunction (pseudonormalization). Elevated left atrial pressure. Right  Ventricle: The right ventricular size is normal. No increase in right ventricular wall thickness. Right ventricular systolic function is normal. There is severely elevated pulmonary artery systolic pressure. The tricuspid regurgitant velocity is 3.54 m/s, and with an assumed right atrial pressure of 15 mmHg, the estimated right ventricular systolic pressure is 84.1 mmHg. Left Atrium: Left atrial size was moderately dilated. Right Atrium: Right atrial size was mildly dilated. Pericardium: A small pericardial effusion is present. The pericardial effusion is circumferential. Mitral Valve: The mitral valve is normal in structure. Moderate mitral valve regurgitation. No evidence of mitral valve stenosis. Tricuspid Valve: The tricuspid valve is normal in structure. Tricuspid valve regurgitation is severe. No evidence of tricuspid stenosis. Aortic Valve: The aortic valve is normal in structure. Aortic valve regurgitation is not visualized. Mild to moderate aortic valve sclerosis/calcification is present, without any evidence of aortic stenosis. Aortic valve mean gradient measures 8.0 mmHg. Aortic valve peak gradient measures 14.6 mmHg. Aortic valve area, by VTI measures 1.24 cm. Pulmonic Valve: The pulmonic valve was normal in structure. Pulmonic valve regurgitation is not visualized. Mild pulmonic stenosis. Aorta: The aortic root is normal in size and structure.  Venous: The inferior vena cava is dilated in size with less than 50% respiratory variability, suggesting right atrial pressure of 15 mmHg. IAS/Shunts: No atrial level shunt detected by color flow Doppler. Additional Comments: A device lead is visualized in the right ventricle and right atrium. There is a large pleural effusion in the left lateral region.  LEFT VENTRICLE PLAX 2D LVIDd:         4.60 cm      Diastology LVIDs:         4.00 cm      LV e' lateral: 7.07 cm/s LV PW:         1.40 cm LV IVS:        1.40 cm LVOT diam:     2.00 cm LV SV:         43 LV SV  Index:   29 LVOT Area:     3.14 cm  LV Volumes (MOD) LV vol d, MOD A2C: 104.0 ml LV vol d, MOD A4C: 148.0 ml LV vol s, MOD A2C: 68.6 ml LV vol s, MOD A4C: 135.0 ml LV SV MOD A2C:     35.4 ml LV SV MOD A4C:     148.0 ml LV SV MOD BP:      24.5 ml RIGHT VENTRICLE            IVC RV Basal diam:  3.70 cm    IVC diam: 1.90 cm RV S prime:     8.81 cm/s TAPSE (M-mode): 1.4 cm LEFT ATRIUM              Index       RIGHT ATRIUM           Index LA diam:        3.90 cm  2.61 cm/m  RA Area:     25.30 cm LA Vol (A2C):   111.0 ml 74.21 ml/m RA Volume:   76.70 ml  51.28 ml/m LA Vol (A4C):   78.5 ml  52.48 ml/m LA Biplane Vol: 96.2 ml  64.31 ml/m  AORTIC VALVE AV Area (Vmax):    1.29 cm AV Area (Vmean):   1.27 cm AV Area (VTI):     1.24 cm AV Vmax:           191.00 cm/s AV Vmean:          128.000 cm/s AV VTI:            0.347 m AV Peak Grad:      14.6 mmHg AV Mean Grad:      8.0 mmHg LVOT Vmax:         78.30 cm/s LVOT Vmean:        51.800 cm/s LVOT VTI:          0.137 m LVOT/AV VTI ratio: 0.39  AORTA Ao Root diam: 3.30 cm Ao Asc diam:  2.80 cm TRICUSPID VALVE TR Peak grad:   50.1 mmHg TR Vmax:        354.00 cm/s  SHUNTS Systemic VTI:  0.14 m Systemic Diam: 2.00 cm Ena Dawley MD Electronically signed by Ena Dawley MD Signature Date/Time: 01/02/2021/12:21:23 PM    Final (Updated)     Cardiac Studies   Echo 01/01/21 1. When compared to the prior study from 04/02/2021 LVEF is significantly  worse with diffuse hypokinesis and severe dyssynchrony secondary to RV  pacing. LVEF 25-30%, previously 40-45%.  2. Left ventricular ejection fraction, by estimation, is 25 to 30%. The  left ventricle  has severely decreased function. The left ventricle  demonstrates global hypokinesis. There is moderate concentric left  ventricular hypertrophy. Left ventricular  diastolic parameters are consistent with Grade II diastolic dysfunction  (pseudonormalization). Elevated left atrial pressure.  3. Right ventricular systolic  function is normal. The right ventricular  size is normal. There is severely elevated pulmonary artery systolic  pressure. The estimated right ventricular systolic pressure is 57.2 mmHg.  4. Left atrial size was moderately dilated.  5. Right atrial size was mildly dilated.  6. A small pericardial effusion is present. The pericardial effusion is  circumferential. Large pleural effusion in the left lateral region.  7. The mitral valve is normal in structure. Moderate mitral valve  regurgitation. No evidence of mitral stenosis.  8. Tricuspid valve regurgitation is severe.  9. The aortic valve is normal in structure. Aortic valve regurgitation is  not visualized. Mild to moderate aortic valve sclerosis/calcification is  present, without any evidence of aortic stenosis. Aortic valve mean  gradient measures 8.0 mmHg.  10. Mild pulmonic stenosis.  11. The inferior vena cava is dilated in size with <50% respiratory  variability, suggesting right atrial pressure of 15 mmHg.   2021 Echo  1. There is significant apical-basal and septal-lateral pacing-induced  dyssynchrony. Left ventricular ejection fraction, by estimation, is 40 to  45%. The left ventricle has mildly decreased function. The left ventricle  demonstrates regional wall motion  abnormalities (see scoring diagram/findings for description). There is  moderate concentric left ventricular hypertrophy. Left ventricular  diastolic parameters are consistent with Grade II diastolic dysfunction  (pseudonormalization). Elevated left atrial  pressure. There is moderate dyskinesis of the left ventricular, entire  apical segment, although this may be artifactual (pacing related).  2. Right ventricular systolic function is normal. The right ventricular  size is normal. Mildly increased right ventricular wall thickness. There  is mildly elevated pulmonary artery systolic pressure. The estimated right  ventricular systolic pressure is   62.0 mmHg.  3. Left atrial size was mildly dilated.  4. Right atrial size was mildly dilated.  5. The mitral valve is normal in structure. Mild mitral valve  regurgitation.  6. Tricuspid valve regurgitation is mild to moderate.  7. The aortic valve is tricuspid. Aortic valve regurgitation is trivial.  Very mild aortic valve stenosis.  8. The inferior vena cava is normal in size with greater than 50%  respiratory variability, suggesting right atrial pressure of 3 mmHg.   Patient Profile     87 y.o. female with PMH ESRD on HD, acute on chronic systolic and diastolic heart failure with worsened EF this admission, atypical atrial flutter, SSS s/p PPM admitted for worsening dyspnea on exertion.  Assessment & Plan    Atrial flutter, with controlled ventricular rate S/P PPM for SSS -based on MAR, did not receive 3/18 AM dose, 3/19 PM dose or apixaban -was scheduled for outpatient cardioversion 3/23. Given missed doses of apixaban, will change this to TEE-CV. -she does endorse dyspnea on exertion, and with worsening EF would try to optimize and get out of flutter -schedule is full to do procedure earlier than 3/23 -she is rate controlled on no agents and intermittently paced  Worsening Cardiomyopathy Acute on chronic systolic and diastolic heart failure ESRD on HD -echo 01/01/21 with EF reduced to 25-30%, prior 40-45% -small pericardial effusion, large pleural effusion -RVSP severely elevated at 65 mmHg. Severe TR. -volume removal per nephrology -on midodrine for hypotension. No BP room for beta blocker or nitrate/hydralazine, no ACEi/ARB/ARNI/MRA given  ESRD.  Type II diabetes -no SGLT2i given ESRD  OSA: continue CPAP  Overall difficult position given comorbidities, baseline blood pressure, worsened EF.  For questions or updates, please contact [place] Please consult [email or web address] for contact info under     Signed, Buford Dresser, MD  01/03/2021, 3:26 PM

## 2021-01-04 DIAGNOSIS — R0602 Shortness of breath: Secondary | ICD-10-CM | POA: Diagnosis not present

## 2021-01-04 DIAGNOSIS — J9601 Acute respiratory failure with hypoxia: Secondary | ICD-10-CM | POA: Diagnosis not present

## 2021-01-04 DIAGNOSIS — N186 End stage renal disease: Secondary | ICD-10-CM | POA: Diagnosis not present

## 2021-01-04 DIAGNOSIS — I484 Atypical atrial flutter: Secondary | ICD-10-CM | POA: Diagnosis not present

## 2021-01-04 DIAGNOSIS — I429 Cardiomyopathy, unspecified: Secondary | ICD-10-CM | POA: Diagnosis not present

## 2021-01-04 LAB — CBC
HCT: 21.7 % — ABNORMAL LOW (ref 36.0–46.0)
Hemoglobin: 7.1 g/dL — ABNORMAL LOW (ref 12.0–15.0)
MCH: 32.3 pg (ref 26.0–34.0)
MCHC: 32.7 g/dL (ref 30.0–36.0)
MCV: 98.6 fL (ref 80.0–100.0)
Platelets: 136 10*3/uL — ABNORMAL LOW (ref 150–400)
RBC: 2.2 MIL/uL — ABNORMAL LOW (ref 3.87–5.11)
RDW: 17.2 % — ABNORMAL HIGH (ref 11.5–15.5)
WBC: 6.4 10*3/uL (ref 4.0–10.5)
nRBC: 0 % (ref 0.0–0.2)

## 2021-01-04 LAB — GLUCOSE, CAPILLARY
Glucose-Capillary: 91 mg/dL (ref 70–99)
Glucose-Capillary: 83 mg/dL (ref 70–99)
Glucose-Capillary: 82 mg/dL (ref 70–99)
Glucose-Capillary: 116 mg/dL — ABNORMAL HIGH (ref 70–99)
Glucose-Capillary: 124 mg/dL — ABNORMAL HIGH (ref 70–99)

## 2021-01-04 LAB — BASIC METABOLIC PANEL
Anion gap: 9 (ref 5–15)
BUN: 35 mg/dL — ABNORMAL HIGH (ref 8–23)
CO2: 25 mmol/L (ref 22–32)
Calcium: 8.2 mg/dL — ABNORMAL LOW (ref 8.9–10.3)
Chloride: 98 mmol/L (ref 98–111)
Creatinine, Ser: 2.43 mg/dL — ABNORMAL HIGH (ref 0.44–1.00)
GFR, Estimated: 19 mL/min — ABNORMAL LOW (ref >60)
Glucose, Bld: 73 mg/dL (ref 70–99)
Potassium: 3.9 mmol/L (ref 3.5–5.1)
Sodium: 132 mmol/L — ABNORMAL LOW (ref 135–145)

## 2021-01-04 LAB — MAGNESIUM: Magnesium: 1.9 mg/dL (ref 1.7–2.4)

## 2021-01-04 MED ORDER — DARBEPOETIN ALFA 60 MCG/0.3ML IJ SOSY
PREFILLED_SYRINGE | INTRAMUSCULAR | Status: AC
  Administered 2021-01-04: 50 ug via INTRAVENOUS
  Filled 2021-01-04: qty 0.3

## 2021-01-04 MED ORDER — DOXERCALCIFEROL 4 MCG/2ML IV SOLN
INTRAVENOUS | Status: AC
  Administered 2021-01-04: 1 ug via INTRAVENOUS
  Filled 2021-01-04: qty 2

## 2021-01-04 MED ORDER — BISACODYL 10 MG RE SUPP
10.0000 mg | Freq: Once | RECTAL | Status: AC
  Administered 2021-01-04: 10 mg via RECTAL
  Filled 2021-01-04 (×2): qty 1

## 2021-01-04 MED ORDER — DARBEPOETIN ALFA 60 MCG/0.3ML IJ SOSY: 50.0000 ug | PREFILLED_SYRINGE | INTRAMUSCULAR | Status: DC

## 2021-01-04 MED ORDER — MIDODRINE HCL 5 MG PO TABS
ORAL_TABLET | ORAL | Status: AC
  Filled 2021-01-04: qty 2

## 2021-01-04 MED ORDER — ACETAMINOPHEN 325 MG PO TABS
ORAL_TABLET | ORAL | Status: AC
  Filled 2021-01-04: qty 2

## 2021-01-04 NOTE — Progress Notes (Signed)
Progress Note  Patient Name: [name] Date of Encounter: 01/04/2021  Primary Cardiologist: Jenkins Rouge, MD   Subjective   Seen in HD today with no specific complaints. Discussed TEE/DCCV planned for tomorrow   Inpatient Medications    Scheduled Meds: . (feeding supplement) PROSource Plus  30 mL Oral TID BM  . acyclovir  400 mg Oral Daily  . allopurinol  50 mg Oral Daily  . apixaban  2.5 mg Oral BID  . Chlorhexidine Gluconate Cloth  6 each Topical Q0600  . cholecalciferol  2,000 Units Oral Daily  . [START ON 01/08/2021] darbepoetin (ARANESP) injection - DIALYSIS  50 mcg Intravenous Q Sat-HD  . dexamethasone  2 mg Oral Daily  . dorzolamide  1 drop Left Eye BID  . doxercalciferol  1 mcg Intravenous Once per day on Tue Thu  . feeding supplement (NEPRO CARB STEADY)  237 mL Oral BID BM  . fluorometholone  1 drop Right Eye Daily  . insulin aspart  0-6 Units Subcutaneous TID WC  . lactulose  10 g Oral TID  . latanoprost  1 drop Left Eye QHS  . metoCLOPramide  5 mg Oral TID AC  . midodrine      . midodrine  10 mg Oral Q T,Th,Sat-1800  . multivitamin  1 tablet Oral QHS  . polyethylene glycol  17 g Oral Daily  . pregabalin  50 mg Oral QHS  . senna-docusate  2 tablet Oral BID   Continuous Infusions:  PRN Meds: acetaminophen **OR** acetaminophen, lidocaine-prilocaine, ondansetron **OR** ondansetron (ZOFRAN) IV, oxyCODONE, pantoprazole   Vital Signs    Vitals:   01/04/21 0800 01/04/21 0830 01/04/21 0900 01/04/21 0930  BP: (!) 100/55 (!) 111/52 (!) 131/51 (!) 103/48  Pulse:    72  Resp: 16 15 16    Temp:      TempSrc:      SpO2:      Weight:      Height:        Intake/Output Summary (Last 24 hours) at 01/04/2021 0952 Last data filed at 01/03/2021 1324 Gross per 24 hour  Intake 240 ml  Output 125 ml  Net 115 ml   Filed Weights   12/31/20 1328 01/04/21 0452 01/04/21 0730  Weight: 47 kg 50.5 kg 52.5 kg    Physical Exam   General: Frail, NAD Neck:  Negative for carotid bruits. No JVD Lungs:Clear to ausculation bilaterally. Breathing is unlabored. Cardiovascular: Irregularly irregualar. No murmurs Abdomen: Soft, non-tender, non-distended  Extremities: No edema.  Neuro: Alert and oriented. No focal deficits. No facial asymmetry. MAE spontaneously. Psych: Responds to questions appropriately with normal affect.    Labs    Chemistry Recent Labs  Lab 12/30/20 1547 12/30/20 2303 12/31/20 0236 01/02/21 0559 01/02/21 1458 01/03/21 1102 01/04/21 0307  NA 136 135   < > 135  --  134* 132*  K 3.3* 3.5   < > 2.9* 4.0 3.8 3.9  CL 96* 97*   < > 98  --  101 98  CO2 30 30   < > 31  --  28 25  GLUCOSE 80 95   < > 110*  --  98 73  BUN 9 11   < > 6*  --  24* 35*  CREATININE 1.72* 2.03*   < > 1.12*  --  2.19* 2.43*  CALCIUM 9.1 8.3*   < > 8.1*  --  8.3* 8.2*  PROT 6.0*  --   --   --   --   --   --  ALBUMIN 4.0 3.1*  --   --   --   --   --   AST 28  --   --   --   --   --   --   ALT 18  --   --   --   --   --   --   ALKPHOS 44  --   --   --   --   --   --   BILITOT 1.7*  --   --   --   --   --   --   GFRNONAA 28* 23*   < > 47*  --  21* 19*  ANIONGAP 10 8   < > 6  --  5 9   < > = values in this interval not displayed.     Hematology Recent Labs  Lab 01/02/21 0559 01/03/21 1102 01/04/21 0307  WBC 5.5 7.3 6.4  RBC 2.19* 2.35* 2.20*  HGB 7.1* 7.5* 7.1*  HCT 20.7* 22.9* 21.7*  MCV 94.5 97.4 98.6  MCH 32.4 31.9 32.3  MCHC 34.3 32.8 32.7  RDW 17.4* 17.4* 17.2*  PLT 112* 127* 136*    Cardiac EnzymesNo results for input(s): TROPONINI in the last 168 hours. No results for input(s): TROPIPOC in the last 168 hours.   BNPNo results for input(s): BNP, PROBNP in the last 168 hours.   DDimer No results for input(s): DDIMER in the last 168 hours.   Radiology    DG CHEST PORT 1 VIEW  Result Date: 01/03/2021 CLINICAL DATA:  Shortness of breath EXAM: PORTABLE CHEST 1 VIEW COMPARISON:  December 30, 2020. FINDINGS: There is ill-defined  airspace opacity in the left base with small left pleural effusion. The right lung is clear. No appreciable interstitial edema. There is cardiomegaly with pulmonary vascularity within normal limits. Pacemaker leads attached to right atrium right ventricle. There is aortic atherosclerosis. Bones are osteoporotic. There is arthropathy in each shoulder. Skin fold noted on the right. IMPRESSION: Ill-defined opacity concerning for pneumonia left base with small left pleural effusion. Right lung clear. Cardiomegaly with pacemaker leads attached to right atrium and right ventricle, stable. Aortic Atherosclerosis (ICD10-I70.0). Electronically Signed   By: Lowella Grip III M.D.   On: 01/03/2021 08:03   Telemetry    AF- Personally Reviewed  ECG    No new tracing as of 01/04/21- Personally Reviewed  Cardiac Studies   Echo 01/01/21  1. When compared to the prior study from 04/02/2021 LVEF is significantly  worse with diffuse hypokinesis and severe dyssynchrony secondary to RV  pacing. LVEF 25-30%, previously 40-45%.  2. Left ventricular ejection fraction, by estimation, is 25 to 30%. The  left ventricle has severely decreased function. The left ventricle  demonstrates global hypokinesis. There is moderate concentric left  ventricular hypertrophy. Left ventricular  diastolic parameters are consistent with Grade II diastolic dysfunction  (pseudonormalization). Elevated left atrial pressure.  3. Right ventricular systolic function is normal. The right ventricular  size is normal. There is severely elevated pulmonary artery systolic  pressure. The estimated right ventricular systolic pressure is 41.3 mmHg.  4. Left atrial size was moderately dilated.  5. Right atrial size was mildly dilated.  6. A small pericardial effusion is present. The pericardial effusion is  circumferential. Large pleural effusion in the left lateral region.  7. The mitral valve is normal in structure. Moderate mitral  valve  regurgitation. No evidence of mitral stenosis.  8. Tricuspid valve regurgitation is severe.  9. The aortic valve is normal in structure. Aortic valve regurgitation is  not visualized. Mild to moderate aortic valve sclerosis/calcification is  present, without any evidence of aortic stenosis. Aortic valve mean  gradient measures 8.0 mmHg.  10. Mild pulmonic stenosis.  11. The inferior vena cava is dilated in size with <50% respiratory  variability, suggesting right atrial pressure of 15 mmHg.   2021 Echo  1. There is significant apical-basal and septal-lateral pacing-induced  dyssynchrony. Left ventricular ejection fraction, by estimation, is 40 to  45%. The left ventricle has mildly decreased function. The left ventricle  demonstrates regional wall motion  abnormalities (see scoring diagram/findings for description). There is  moderate concentric left ventricular hypertrophy. Left ventricular  diastolic parameters are consistent with Grade II diastolic dysfunction  (pseudonormalization). Elevated left atrial  pressure. There is moderate dyskinesis of the left ventricular, entire  apical segment, although this may be artifactual (pacing related).  2. Right ventricular systolic function is normal. The right ventricular  size is normal. Mildly increased right ventricular wall thickness. There  is mildly elevated pulmonary artery systolic pressure. The estimated right  ventricular systolic pressure is  39.7 mmHg.  3. Left atrial size was mildly dilated.  4. Right atrial size was mildly dilated.  5. The mitral valve is normal in structure. Mild mitral valve  regurgitation.  6. Tricuspid valve regurgitation is mild to moderate.  7. The aortic valve is tricuspid. Aortic valve regurgitation is trivial.  Very mild aortic valve stenosis.  8. The inferior vena cava is normal in size with greater than 50%  respiratory variability, suggesting right atrial pressure of 3 mmHg.    Patient Profile     87 y.o. female with PMH ESRD on HD, acute on chronic systolic and diastolic heart failure with worsened EF this admission, atypical atrial flutter, SSS s/p PPM admitted for worsening dyspnea on exertion.  Assessment & Plan    1. Atrial flutter with stable rates: -Plan for TEE/DCCV tomorrow>>missed several dosing of Eliquis while inpatient. Previously scheduled for OP cardioversion however due to worsening SOB plan was for admission>>see consent in separate progress note   2. Cardiomyopathy: -Echo this admission with LVEF at 25-30%>>previously 40-45% with small pericardial effusion, severe TR -Volume control per HD -Currenlty on midodrine for hypotension  -Unable to add beta blocker or nitrates, no ACEI/ARB/ARNI   3. DM2: -HbA1c, 4.6   4. OSA: -Continue with CPAP  5. ESRD: -Currently in HD -Nephrology following    Signed, Kathyrn Drown NP-C [facility] Pager: [phone] 01/04/2021, 9:52 AM     For questions or updates, please contact   Please consult [email or web address] for contact info under Cardiology/STEMI.

## 2021-01-04 NOTE — Progress Notes (Addendum)
Subjective: Seen on dialysis no complaints  Objective Vital signs in last 24 hours: Vitals:   01/04/21 0452 01/04/21 0730 01/04/21 0735 01/04/21 0755  BP: (!) 104/54 (!) 101/57 (!) 98/57 (!) 103/56  Pulse: 79     Resp: 18 16 16    Temp: 97.8 F (36.6 C) 98 F (36.7 C)    TempSrc: Oral Oral    SpO2: 100% 100%    Weight: 50.5 kg 52.5 kg    Height:       Weight change:   Physical Exam: General: Alert elderly thin female NAD Heart: RRR, no MRG Lungs: CTA nonlabored breathing Abdomen: Bowel sounds normoactive, soft, NTND Extremities: Trace bipedal edema Dialysis Access: Patent on HD right arm AV fistula  OP dialysis Orders:  [place]. TTS. 4hrs,3K,2.5Ca, EDW49 kg  heparin 2k bolus Hectorol 1 mic Mircera 50 MCG q 2 weeks , not yet given at outpatient center  right upper arm AV fistula   Problem/Plan  1.Acute Respiratory Failure: Volume overload and related  A flutter, UF with HD   follow-up standing weights for possible lower dry weight at discharge 2.  ESRD- HD TTS:On schedule K+  3.9 use 3K bath   3. .Atrial flutter  cardiology following , patient plans for cardioversion   4 Worsening  CM-Per echo with EF 25 to 30% compared to prior 40 to 45% multiple effusion with small pericardial effusion 5 HTN/volume-Patient's volume status resolved with HD on admission, a.m. BP systolic 010/UV midodrine for BP support for  HD, lower dry weight at time of discharge as tolerated needs standing weight 6. Anemiaof CKD-Hgb trending downward; now at  7.1 <7.5< 7.1.Tsat 37, Iron 63, andferritin 1226.Iron series not indicated at this time.  GiveAranesp42mcg weekly Tuesday on dialysis today 7 Secondaryhyperparathyroidism-  Corrected Ca at 8.9. ,, Phos 2.6 no binder, on  Hectorol 64mcg twice weekly. 8. Nutrition-ALB 3.1 , on Regular  Diet   , ,okay with low K and low phosphorus , with  fluid restrictions ,on  Prosourse protein supplement  Ernest Haber,  PA-C [facility] [phone] 01/04/2021,8:16 AM  LOS: 4 days   Labs: Basic Metabolic Panel: Recent Labs  Lab 12/30/20 2303 12/31/20 0236 01/02/21 0559 01/02/21 1458 01/03/21 1102 01/04/21 0307  NA 135   < > 135  --  134* 132*  K 3.5   < > 2.9* 4.0 3.8 3.9  CL 97*   < > 98  --  101 98  CO2 30   < > 31  --  28 25  GLUCOSE 95   < > 110*  --  98 73  BUN 11   < > 6*  --  24* 35*  CREATININE 2.03*   < > 1.12*  --  2.19* 2.43*  CALCIUM 8.3*   < > 8.1*  --  8.3* 8.2*  PHOS 2.6  --   --   --   --   --    < > = values in this interval not displayed.   Liver Function Tests: Recent Labs  Lab 12/30/20 1547 12/30/20 2303  AST 28  --   ALT 18  --   ALKPHOS 44  --   BILITOT 1.7*  --   PROT 6.0*  --   ALBUMIN 4.0 3.1*   No results for input(s): LIPASE, AMYLASE in the last 168 hours. No results for input(s): AMMONIA in the last 168 hours. CBC: Recent Labs  Lab 12/31/20 0236 01/01/21 0934 01/02/21 0559 01/03/21  1102 01/04/21 0307  WBC 5.8 5.7 5.5 7.3 6.4  NEUTROABS  --  4.3 3.8 4.9  --   HGB 7.2* 8.8* 7.1* 7.5* 7.1*  HCT 20.7* 25.5* 20.7* 22.9* 21.7*  MCV 92.8 94.1 94.5 97.4 98.6  PLT 128* 122* 112* 127* 136*   Cardiac Enzymes: No results for input(s): CKTOTAL, CKMB, CKMBINDEX, TROPONINI in the last 168 hours. CBG: Recent Labs  Lab 01/03/21 0811 01/03/21 1130 01/03/21 1644 01/04/21 0121 01/04/21 0622  GLUCAP 121* 101* 112* 91 83    Studies/Results: DG CHEST PORT 1 VIEW  Result Date: 01/03/2021 CLINICAL DATA:  Shortness of breath EXAM: PORTABLE CHEST 1 VIEW COMPARISON:  December 30, 2020. FINDINGS: There is ill-defined airspace opacity in the left base with small left pleural effusion. The right lung is clear. No appreciable interstitial edema. There is cardiomegaly with pulmonary vascularity within normal limits. Pacemaker leads attached to right atrium right ventricle. There is aortic atherosclerosis. Bones are osteoporotic. There is arthropathy in  each shoulder. Skin fold noted on the right. IMPRESSION: Ill-defined opacity concerning for pneumonia left base with small left pleural effusion. Right lung clear. Cardiomegaly with pacemaker leads attached to right atrium and right ventricle, stable. Aortic Atherosclerosis (ICD10-I70.0). Electronically Signed   By: Lowella Grip III M.D.   On: 01/03/2021 08:03   Medications:  . (feeding supplement) PROSource Plus  30 mL Oral TID BM  . acetaminophen      . acyclovir  400 mg Oral Daily  . allopurinol  50 mg Oral Daily  . apixaban  2.5 mg Oral BID  . Chlorhexidine Gluconate Cloth  6 each Topical Q0600  . cholecalciferol  2,000 Units Oral Daily  . [START ON 01/08/2021] darbepoetin (ARANESP) injection - DIALYSIS  50 mcg Intravenous Q Sat-HD  . dexamethasone  2 mg Oral Daily  . dorzolamide  1 drop Left Eye BID  . doxercalciferol      . doxercalciferol  1 mcg Intravenous Once per day on Tue Thu  . feeding supplement (NEPRO CARB STEADY)  237 mL Oral BID BM  . fluorometholone  1 drop Right Eye Daily  . insulin aspart  0-6 Units Subcutaneous TID WC  . lactulose  10 g Oral TID  . latanoprost  1 drop Left Eye QHS  . metoCLOPramide  5 mg Oral TID AC  . midodrine      . midodrine  10 mg Oral Q T,Th,Sat-1800  . multivitamin  1 tablet Oral QHS  . polyethylene glycol  17 g Oral Daily  . pregabalin  50 mg Oral QHS  . senna-docusate  2 tablet Oral BID

## 2021-01-04 NOTE — Progress Notes (Signed)
PROGRESS NOTE    GALYA DUNNIGAN  ZOX:[id number] DOB: 07/22/1930 DOA: 12/30/2020 PCP: Lucianne Lei, MD   Brief Narrative:  Blenda Wisecup Pettitis a 87 y.o.femalewith medical history significant ofDM2, ESRD, HTN, MM, SSS bradycardia s/p PPM, atrial flutter presented to the hospital with shortness of breath. Patient was recently seen by cardiology as outpatient and was scheduled for cardioversion next week due to shortness of breath which was thought to be secondary to atrial flutter. She did have partial dialysis session and was brought into the hospital for evaluation of shortness of breath. Covid test was negative. Troponins were mildly elevated about baseline. Chest x-ray showed bilateral pleural effusion. Patient required 4 L of oxygen initially in the ED. Patient was then admitted to hospital for further evaluation and treatment  Hospital course: Patient presented to the hospital with acute respiratory failure secondary to hypoxia. This was attributed to decompensated heart failure in the setting of atypical a flutter. She was successfully weaned off oxygen and was seen by cardiology as well as nephrology for end-stage renal disease on hemodialysis  Cardiology plans on doing inpatient cardioversion with TEE on 3/23.   Assessment & Plan:   Principal Problem:   Acute respiratory failure with hypoxia (HCC) Active Problems:   Multiple myeloma not having achieved remission (HCC)   ESRD (end stage renal disease) on dialysis (HCC)   Atypical atrial flutter (HCC)   Fluid overload   Protein-calorie malnutrition, severe   Atrial flutter (HCC)   Acute resp failure with hypoxia -Likely secondary to decompensated heart failure in the setting of atypical atrial flutter.Weaned off4 L of oxygen by nasal cannula. Cardiology has been consulted. Nephrology on board as well. No signs of infection such as pneumonia. Continue dialysis as per nephrology. Performed today 3/22. -Continue  renal diet  Persistent atypical atrial flutter. Cardiology has been consulted. Continue Eliquis. There was plan for cardioversion as outpatient.Cardiology for TEE/DCCV on 3/23. NPO after midnight.  Worsening cardiomyopathy Echo with LVEF 25-30% compared to 40-45% prior Large pleural effusion with small pericardial effusion  Diabetes mellitus type II. On glimepiride at home. Hold OHA. We will add sliding scale insulin. Continue diabetic diet, Accu-Cheks.  ESRDon hemodialysis.- Hemodialysis as per nephrology. Nephrology on board.  Multiple myeloma. Cont decadron,allopurinol.  Severe protein calorie malnutrition.Present on admission. Evidence for significant weight loss, severe fat depletion, seen by dietitian. Continue nutritional supplements.   DVT prophylaxis:Eliquis Code Status: Full Family Communication: Called son 3/22 Disposition Plan:  Status is: Inpatient  Remains inpatient appropriate because:Ongoing diagnostic testing needed not appropriate for outpatient work up, IV treatments appropriate due to intensity of illness or inability to take PO and Inpatient level of care appropriate due to severity of illness   Dispo: The patient is from: Home  Anticipated d/c is to: Home  Patient currently is not medically stable to d/c.              Difficult to place patient No  Nutritional Assessment:  The patient's BMI is: Body mass index is 17.24 kg/m.Marland Kitchen  Seen by dietician.  I agree with the assessment and plan as outlined below:  Nutrition Status: Nutrition Problem: Severe Malnutrition Etiology: chronic illness (ESRD on HD) Signs/Symptoms: percent weight loss,severe fat depletion,severe muscle depletion,per patient/family report Percent weight loss: 5 % Interventions: Nepro shake,MVI,Liberalize Diet   Consultants:   Cardiology  Nephrology  Procedures:   See below  Antimicrobials:    None  Subjective: Patient seen and evaluated today with no new acute complaints or concerns.  No acute concerns or events noted overnight.  Objective: Vitals:   01/04/21 1100 01/04/21 1130 01/04/21 1140 01/04/21 1143  BP: 104/63 (!) 105/53 112/61 108/63  Pulse:  73    Resp: 15  15   Temp:   97.8 F (36.6 C)   TempSrc:   Oral   SpO2:   100%   Weight:   51 kg   Height:        Intake/Output Summary (Last 24 hours) at 01/04/2021 1234 Last data filed at 01/04/2021 1140 Gross per 24 hour  Intake 240 ml  Output 1625 ml  Net -1385 ml   Filed Weights   01/04/21 0452 01/04/21 0730 01/04/21 1140  Weight: 50.5 kg 52.5 kg 51 kg    Examination:  General exam: Appears calm and comfortable  Respiratory system: Clear to auscultation. Respiratory effort normal. On  oxygen. Cardiovascular system: S1 & S2 heard, RRR.  Gastrointestinal system: Abdomen is soft Central nervous system: Alert and awake Extremities: No edema Skin: No significant lesions noted Psychiatry: Flat affect.    Data Reviewed: I have personally reviewed following labs and imaging studies  CBC: Recent Labs  Lab 12/30/20 1547 12/30/20 2303 12/31/20 0236 01/01/21 0934 01/02/21 0559 01/03/21 1102 01/04/21 0307  WBC 5.7   < > 5.8 5.7 5.5 7.3 6.4  NEUTROABS 3.5  --   --  4.3 3.8 4.9  --   HGB 9.7*   < > 7.2* 8.8* 7.1* 7.5* 7.1*  HCT 28.6*   < > 20.7* 25.5* 20.7* 22.9* 21.7*  MCV 93.5   < > 92.8 94.1 94.5 97.4 98.6  PLT 181   < > 128* 122* 112* 127* 136*   < > = values in this interval not displayed.   Basic Metabolic Panel: Recent Labs  Lab 12/30/20 1547 12/30/20 2303 12/31/20 0236 01/01/21 0934 01/02/21 0559 01/02/21 1458 01/03/21 1102 01/04/21 0307  NA 136 135 135 134* 135  --  134* 132*  K 3.3* 3.5 3.4* 4.0 2.9* 4.0 3.8 3.9  CL 96* 97* 97* 97* 98  --  101 98  CO2 _0 --  28 25  GLUCOSE 80 95 86 122* 110*  --  98 73  BUN _1 6*  --  24* 35*  CREATININE 1.72* 2.03* 2.10*  1.98* 1.12*  --  2.19* 2.43*  CALCIUM 9.1 8.3* 8.1* 8.4* 8.1*  --  8.3* 8.2*  MG 2.0  --   --   --   --   --   --  1.9  PHOS  --  2.6  --   --   --   --   --   --    GFR: Estimated Creatinine Clearance: 12.9 mL/min (A) (by C-G formula based on SCr of 2.43 mg/dL (H)). Liver Function Tests: Recent Labs  Lab 12/30/20 1547 12/30/20 2303  AST 28  --   ALT 18  --   ALKPHOS 44  --   BILITOT 1.7*  --   PROT 6.0*  --   ALBUMIN 4.0 3.1*   No results for input(s): LIPASE, AMYLASE in the last 168 hours. No results for input(s): AMMONIA in the last 168 hours. Coagulation Profile: No results for input(s): INR, PROTIME in the last 168 hours. Cardiac Enzymes: No results for input(s): CKTOTAL, CKMB, CKMBINDEX, TROPONINI in the last 168 hours. BNP (last 3 results) No results for input(s): PROBNP in the last 8760 hours. HbA1C: No results for input(s): HGBA1C  in the last 72 hours. CBG: Recent Labs  Lab 01/03/21 1130 01/03/21 1644 01/04/21 0121 01/04/21 0622 01/04/21 1219  GLUCAP 101* 112* 91 83 82   Lipid Profile: No results for input(s): CHOL, HDL, LDLCALC, TRIG, CHOLHDL, LDLDIRECT in the last 72 hours. Thyroid Function Tests: No results for input(s): TSH, T4TOTAL, FREET4, T3FREE, THYROIDAB in the last 72 hours. Anemia Panel: No results for input(s): VITAMINB12, FOLATE, FERRITIN, TIBC, IRON, RETICCTPCT in the last 72 hours. Sepsis Labs: Recent Labs  Lab 12/30/20 1545 12/30/20 1740  LATICACIDVEN 2.0* 1.4    Recent Results (from the past 240 hour(s))  Resp Panel by RT-PCR (Flu A&B, Covid) Nasopharyngeal Swab     Status: None   Collection Time: 12/30/20  3:28 PM   Specimen: Nasopharyngeal Swab; Nasopharyngeal(NP) swabs in vial transport medium  Result Value Ref Range Status   SARS Coronavirus 2 by RT PCR NEGATIVE NEGATIVE Final    Comment: (NOTE) SARS-CoV-2 target nucleic acids are NOT DETECTED.  The SARS-CoV-2 RNA is generally detectable in upper respiratory specimens during  the acute phase of infection. The lowest concentration of SARS-CoV-2 viral copies this assay can detect is 138 copies/mL. A negative result does not preclude SARS-Cov-2 infection and should not be used as the sole basis for treatment or other patient management decisions. A negative result may occur with  improper specimen collection/handling, submission of specimen other than nasopharyngeal swab, presence of viral mutation(s) within the areas targeted by this assay, and inadequate number of viral copies(<138 copies/mL). A negative result must be combined with clinical observations, patient history, and epidemiological information. The expected result is Negative.  Fact Sheet for Patients:  EntrepreneurPulse.com.au  Fact Sheet for Healthcare Providers:  IncredibleEmployment.be  This test is no t yet approved or cleared by the [place] FDA and  has been authorized for detection and/or diagnosis of SARS-CoV-2 by FDA under an Emergency Use Authorization (EUA). This EUA will remain  in effect (meaning this test can be used) for the duration of the COVID-19 declaration under Section 564(b)(1) of the Act, 21 U.S.C.section 360bbb-3(b)(1), unless the authorization is terminated  or revoked sooner.       Influenza A by PCR NEGATIVE NEGATIVE Final   Influenza B by PCR NEGATIVE NEGATIVE Final    Comment: (NOTE) The Xpert Xpress SARS-CoV-2/FLU/RSV plus assay is intended as an aid in the diagnosis of influenza from Nasopharyngeal swab specimens and should not be used as a sole basis for treatment. Nasal washings and aspirates are unacceptable for Xpert Xpress SARS-CoV-2/FLU/RSV testing.  Fact Sheet for Patients: EntrepreneurPulse.com.au  Fact Sheet for Healthcare Providers: IncredibleEmployment.be  This test is not yet approved or cleared by the [place] FDA and has been authorized for detection and/or  diagnosis of SARS-CoV-2 by FDA under an Emergency Use Authorization (EUA). This EUA will remain in effect (meaning this test can be used) for the duration of the COVID-19 declaration under Section 564(b)(1) of the Act, 21 U.S.C. section 360bbb-3(b)(1), unless the authorization is terminated or revoked.  Performed at [facility], [place] [address], [place], [place] [address]   MRSA PCR Screening     Status: None   Collection Time: 12/31/20  6:53 AM   Specimen: Nasopharyngeal  Result Value Ref Range Status   MRSA by PCR NEGATIVE NEGATIVE Final    Comment:        The GeneXpert MRSA Assay (FDA approved for NASAL specimens only), is one component of a comprehensive MRSA colonization surveillance program. It is  not intended to diagnose MRSA infection nor to guide or monitor treatment for MRSA infections. Performed at [facility], [place] [address], [place], [place] [address]          Radiology Studies: DG CHEST PORT 1 VIEW  Result Date: 01/03/2021 CLINICAL DATA:  Shortness of breath EXAM: PORTABLE CHEST 1 VIEW COMPARISON:  December 30, 2020. FINDINGS: There is ill-defined airspace opacity in the left base with small left pleural effusion. The right lung is clear. No appreciable interstitial edema. There is cardiomegaly with pulmonary vascularity within normal limits. Pacemaker leads attached to right atrium right ventricle. There is aortic atherosclerosis. Bones are osteoporotic. There is arthropathy in each shoulder. Skin fold noted on the right. IMPRESSION: Ill-defined opacity concerning for pneumonia left base with small left pleural effusion. Right lung clear. Cardiomegaly with pacemaker leads attached to right atrium and right ventricle, stable. Aortic Atherosclerosis (ICD10-I70.0). Electronically Signed   By: Lowella Grip III M.D.   On: 01/03/2021 08:03        Scheduled Meds: . (feeding supplement) PROSource Plus  30 mL Oral TID BM  . acyclovir  400 mg Oral  Daily  . allopurinol  50 mg Oral Daily  . apixaban  2.5 mg Oral BID  . bisacodyl  10 mg Rectal Once  . Chlorhexidine Gluconate Cloth  6 each Topical Q0600  . cholecalciferol  2,000 Units Oral Daily  . darbepoetin (ARANESP) injection - DIALYSIS  50 mcg Intravenous Q Tue-HD  . dexamethasone  2 mg Oral Daily  . dorzolamide  1 drop Left Eye BID  . doxercalciferol  1 mcg Intravenous Once per day on Tue Thu  . feeding supplement (NEPRO CARB STEADY)  237 mL Oral BID BM  . fluorometholone  1 drop Right Eye Daily  . insulin aspart  0-6 Units Subcutaneous TID WC  . lactulose  10 g Oral TID  . latanoprost  1 drop Left Eye QHS  . metoCLOPramide  5 mg Oral TID AC  . midodrine      . midodrine  10 mg Oral Q T,Th,Sat-1800  . multivitamin  1 tablet Oral QHS  . polyethylene glycol  17 g Oral Daily  . pregabalin  50 mg Oral QHS  . senna-docusate  2 tablet Oral BID    LOS: 4 days    Time spent: 35 minutes    Kaianna Dolezal Darleen Crocker, DO [facility]  If 7PM-7AM, please contact night-coverage [email or web address] 01/04/2021, 12:34 PM

## 2021-01-04 NOTE — Progress Notes (Signed)
    [facility] has been requested to perform a transesophageal echocardiogram with direct current cardioversion on [name] for atrial fibrillation.  After careful review of history and examination, the risks and benefits of transesophageal echocardiogram have been explained including risks of esophageal damage, perforation (1:10,000 risk), bleeding, pharyngeal hematoma as well as other potential complications associated with conscious sedation including aspiration, arrhythmia, respiratory failure and death. Alternatives to treatment were discussed, questions were answered. Patient is willing to proceed.   Kathyrn Drown, NP  01/04/2021 10:31 AM

## 2021-01-04 NOTE — Plan of Care (Signed)
  Problem: Health Behavior/Discharge Planning: Goal: Ability to manage health-related needs will improve Outcome: Progressing   Problem: Activity: Goal: Risk for activity intolerance will decrease Outcome: Progressing   Problem: Nutrition: Goal: Adequate nutrition will be maintained Outcome: Progressing   Problem: Elimination: Goal: Will not experience complications related to bowel motility Outcome: Progressing   Problem: Safety: Goal: Ability to remain free from injury will improve Outcome: Progressing

## 2021-01-04 NOTE — Plan of Care (Signed)
  Problem: Health Behavior/Discharge Planning: Goal: Ability to manage health-related needs will improve Outcome: Progressing   Problem: Clinical Measurements: Goal: Will remain free from infection Outcome: Progressing Goal: Diagnostic test results will improve Outcome: Progressing Goal: Respiratory complications will improve Outcome: Progressing Goal: Cardiovascular complication will be avoided Outcome: Progressing   Problem: Activity: Goal: Risk for activity intolerance will decrease Outcome: Progressing   Problem: Nutrition: Goal: Adequate nutrition will be maintained Outcome: Progressing   Problem: Coping: Goal: Level of anxiety will decrease Outcome: Progressing   Problem: Elimination: Goal: Will not experience complications related to bowel motility Outcome: Progressing Goal: Will not experience complications related to urinary retention Outcome: Progressing   Problem: Pain Managment: Goal: General experience of comfort will improve Outcome: Progressing   Problem: Safety: Goal: Ability to remain free from injury will improve Outcome: Progressing   Problem: Skin Integrity: Goal: Risk for impaired skin integrity will decrease Outcome: Progressing   

## 2021-01-05 ENCOUNTER — Encounter (HOSPITAL_COMMUNITY)
Admission: EM | Disposition: A | Discharge status: Home Health | Payer: Self-pay | Source: Home / Self Care | Attending: Internal Medicine

## 2021-01-05 ENCOUNTER — Inpatient Hospital Stay (HOSPITAL_COMMUNITY): Admission: RE | Admit: 2021-01-05 | Payer: Medicare PPO | Source: Home / Self Care | Admitting: Cardiovascular Disease

## 2021-01-05 ENCOUNTER — Inpatient Hospital Stay (HOSPITAL_COMMUNITY): Payer: Medicare PPO | Admitting: Certified Registered"

## 2021-01-05 ENCOUNTER — Inpatient Hospital Stay (HOSPITAL_COMMUNITY): Payer: Medicare PPO

## 2021-01-05 ENCOUNTER — Encounter (HOSPITAL_COMMUNITY): Payer: Self-pay | Admitting: Internal Medicine

## 2021-01-05 DIAGNOSIS — J9601 Acute respiratory failure with hypoxia: Secondary | ICD-10-CM | POA: Diagnosis not present

## 2021-01-05 LAB — CBC
HCT: 21.2 % — ABNORMAL LOW (ref 36.0–46.0)
Hemoglobin: 7.1 g/dL — ABNORMAL LOW (ref 12.0–15.0)
MCH: 32.3 pg (ref 26.0–34.0)
MCHC: 33.5 g/dL (ref 30.0–36.0)
MCV: 96.4 fL (ref 80.0–100.0)
Platelets: 124 10*3/uL — ABNORMAL LOW (ref 150–400)
RBC: 2.2 MIL/uL — ABNORMAL LOW (ref 3.87–5.11)
RDW: 17.2 % — ABNORMAL HIGH (ref 11.5–15.5)
WBC: 7.2 10*3/uL (ref 4.0–10.5)
nRBC: 0 % (ref 0.0–0.2)

## 2021-01-05 LAB — BASIC METABOLIC PANEL
Anion gap: 5 (ref 5–15)
BUN: 17 mg/dL (ref 8–23)
CO2: 28 mmol/L (ref 22–32)
Calcium: 7.9 mg/dL — ABNORMAL LOW (ref 8.9–10.3)
Chloride: 99 mmol/L (ref 98–111)
Creatinine, Ser: 1.56 mg/dL — ABNORMAL HIGH (ref 0.44–1.00)
GFR, Estimated: 32 mL/min — ABNORMAL LOW (ref >60)
Glucose, Bld: 90 mg/dL (ref 70–99)
Potassium: 3.9 mmol/L (ref 3.5–5.1)
Sodium: 132 mmol/L — ABNORMAL LOW (ref 135–145)

## 2021-01-05 LAB — GLUCOSE, CAPILLARY
Glucose-Capillary: 122 mg/dL — ABNORMAL HIGH (ref 70–99)
Glucose-Capillary: 77 mg/dL (ref 70–99)
Glucose-Capillary: 70 mg/dL (ref 70–99)
Glucose-Capillary: 137 mg/dL — ABNORMAL HIGH (ref 70–99)
Glucose-Capillary: 159 mg/dL — ABNORMAL HIGH (ref 70–99)

## 2021-01-05 LAB — MAGNESIUM: Magnesium: 1.8 mg/dL (ref 1.7–2.4)

## 2021-01-05 SURGERY — CANCELLED PROCEDURE

## 2021-01-05 NOTE — Progress Notes (Signed)
Progress Note  Patient Name: [name] Date of Encounter: 01/06/2021  Primary Cardiologist: Jenkins Rouge, MD  Subjective   Plan for TEE/DCCV tomorrow>>postponed to 01/07/21 due to poor IV access   Inpatient Medications    Scheduled Meds: . (feeding supplement) PROSource Plus  30 mL Oral TID BM  . acyclovir  400 mg Oral Daily  . allopurinol  50 mg Oral Daily  . apixaban  2.5 mg Oral BID  . Chlorhexidine Gluconate Cloth  6 each Topical Q0600  . cholecalciferol  2,000 Units Oral Daily  . darbepoetin (ARANESP) injection - DIALYSIS  50 mcg Intravenous Q Tue-HD  . dexamethasone  2 mg Oral Daily  . dorzolamide  1 drop Left Eye BID  . doxercalciferol  1 mcg Intravenous Once per day on Tue Thu  . feeding supplement (NEPRO CARB STEADY)  237 mL Oral BID BM  . fluorometholone  1 drop Right Eye Daily  . insulin aspart  0-6 Units Subcutaneous TID WC  . lactulose  10 g Oral TID  . latanoprost  1 drop Left Eye QHS  . metoCLOPramide  5 mg Oral TID AC  . midodrine  10 mg Oral Q T,Th,Sat-1800  . multivitamin  1 tablet Oral QHS  . polyethylene glycol  17 g Oral Daily  . pregabalin  50 mg Oral QHS  . senna-docusate  2 tablet Oral BID   Continuous Infusions:  PRN Meds: acetaminophen **OR** acetaminophen, lidocaine-prilocaine, ondansetron **OR** ondansetron (ZOFRAN) IV, oxyCODONE, pantoprazole   Vital Signs    Vitals:   01/05/21 1043 01/05/21 1630 01/05/21 2056 01/06/21 0521  BP: 116/63 102/63 (!) 107/47 (!) 107/57  Pulse: 76 75 75 75  Resp: 17 18 17 18   Temp: 97.9 F (36.6 C) 98 F (36.7 C) 98.4 F (36.9 C) 98.2 F (36.8 C)  TempSrc:      SpO2: 100% 100% 99% 99%  Weight:   51 kg   Height:        Intake/Output Summary (Last 24 hours) at 01/06/2021 0848 Last data filed at 01/06/2021 0700 Gross per 24 hour  Intake 800 ml  Output 0 ml  Net 800 ml   Filed Weights   01/04/21 1140 01/04/21 2127 01/05/21 2056  Weight: 51 kg 51 kg 51 kg    Physical Exam    General: Well developed, well nourished, NAD Neck: Negative for carotid bruits. No JVD Lungs:Clear to ausculation bilaterally. No wheezes, rales, or rhonchi. Breathing is unlabored. Cardiovascular: Irregularly irregular. No murmurs Abdomen: Soft, non-tender, non-distended. No obvious abdominal masses. Extremities: No edema. Radial pulses 2+ bilaterally Neuro: Alert and oriented. No focal deficits. No facial asymmetry. MAE spontaneously. Psych: Responds to questions appropriately with normal affect.    Labs    Chemistry Recent Labs  Lab 12/30/20 1547 12/30/20 2303 12/31/20 0236 01/03/21 1102 01/04/21 0307 01/05/21 0233  NA 136 135   < > 134* 132* 132*  K 3.3* 3.5   < > 3.8 3.9 3.9  CL 96* 97*   < > 101 98 99  CO2 30 30   < > 28 25 28   GLUCOSE 80 95   < > 98 73 90  BUN 9 11   < > 24* 35* 17  CREATININE 1.72* 2.03*   < > 2.19* 2.43* 1.56*  CALCIUM 9.1 8.3*   < > 8.3* 8.2* 7.9*  PROT 6.0*  --   --   --   --   --   ALBUMIN 4.0 3.1*  --   --   --   --  AST 28  --   --   --   --   --   ALT 18  --   --   --   --   --   ALKPHOS 44  --   --   --   --   --   BILITOT 1.7*  --   --   --   --   --   GFRNONAA 28* 23*   < > 21* 19* 32*  ANIONGAP 10 8   < > 5 9 5    < > = values in this interval not displayed.     Hematology Recent Labs  Lab 01/04/21 0307 01/05/21 0233 01/06/21 0741  WBC 6.4 7.2 6.4  RBC 2.20* 2.20* 2.56*  HGB 7.1* 7.1* 8.2*  HCT 21.7* 21.2* 25.1*  MCV 98.6 96.4 98.0  MCH 32.3 32.3 32.0  MCHC 32.7 33.5 32.7  RDW 17.2* 17.2* 17.7*  PLT 136* 124* 162    Cardiac EnzymesNo results for input(s): TROPONINI in the last 168 hours. No results for input(s): TROPIPOC in the last 168 hours.   BNPNo results for input(s): BNP, PROBNP in the last 168 hours.   DDimer No results for input(s): DDIMER in the last 168 hours.   Radiology    No results found.  Telemetry    01/06/21 rate controlled AF with HR 70-80's - Personally Reviewed  ECG    No new tracing as  of 01/05/21- Personally Reviewed  Cardiac Studies   Echo 01/01/21  1. When compared to the prior study from 04/02/2021 LVEF is significantly  worse with diffuse hypokinesis and severe dyssynchrony secondary to RV  pacing. LVEF 25-30%, previously 40-45%.  2. Left ventricular ejection fraction, by estimation, is 25 to 30%. The  left ventricle has severely decreased function. The left ventricle  demonstrates global hypokinesis. There is moderate concentric left  ventricular hypertrophy. Left ventricular  diastolic parameters are consistent with Grade II diastolic dysfunction  (pseudonormalization). Elevated left atrial pressure.  3. Right ventricular systolic function is normal. The right ventricular  size is normal. There is severely elevated pulmonary artery systolic  pressure. The estimated right ventricular systolic pressure is 09.3 mmHg.  4. Left atrial size was moderately dilated.  5. Right atrial size was mildly dilated.  6. A small pericardial effusion is present. The pericardial effusion is  circumferential. Large pleural effusion in the left lateral region.  7. The mitral valve is normal in structure. Moderate mitral valve  regurgitation. No evidence of mitral stenosis.  8. Tricuspid valve regurgitation is severe.  9. The aortic valve is normal in structure. Aortic valve regurgitation is  not visualized. Mild to moderate aortic valve sclerosis/calcification is  present, without any evidence of aortic stenosis. Aortic valve mean  gradient measures 8.0 mmHg.  10. Mild pulmonic stenosis.  11. The inferior vena cava is dilated in size with <50% respiratory  variability, suggesting right atrial pressure of 15 mmHg.   2021 Echo  1. There is significant apical-basal and septal-lateral pacing-induced  dyssynchrony. Left ventricular ejection fraction, by estimation, is 40 to  45%. The left ventricle has mildly decreased function. The left ventricle  demonstrates regional  wall motion  abnormalities (see scoring diagram/findings for description). There is  moderate concentric left ventricular hypertrophy. Left ventricular  diastolic parameters are consistent with Grade II diastolic dysfunction  (pseudonormalization). Elevated left atrial  pressure. There is moderate dyskinesis of the left ventricular, entire  apical segment, although this may be artifactual (pacing related).  2. Right ventricular systolic function is normal. The right ventricular  size is normal. Mildly increased right ventricular wall thickness. There  is mildly elevated pulmonary artery systolic pressure. The estimated right  ventricular systolic pressure is  16.1 mmHg.  3. Left atrial size was mildly dilated.  4. Right atrial size was mildly dilated.  5. The mitral valve is normal in structure. Mild mitral valve  regurgitation.  6. Tricuspid valve regurgitation is mild to moderate.  7. The aortic valve is tricuspid. Aortic valve regurgitation is trivial.  Very mild aortic valve stenosis.  8. The inferior vena cava is normal in size with greater than 50%  respiratory variability, suggesting right atrial pressure of 3 mmHg.   Patient Profile     87 y.o. female with PMHESRD on HD, acute on chronic systolic and diastolic heart failure with worsened EF this admission, atypical atrial flutter, SSS s/p PPM admitted for worsening dyspnea on exertion.  Assessment & Plan    1. Persistent atrial flutter with stable rates: -Plan for TEE/DCCV 01/07/21>>missed several dosing of Eliquis while inpatient and had poor IV access on attempt 01/05/21  -Previously scheduled for OP cardioversion however due to worsening SOB plan was for admission>>see consent in separate progress note  -Continue with Eliquis   2. Cardiomyopathy: -Echo this admission with LVEF at 25-30%>>previously 40-45% with small pericardial effusion, severe TR -Volume control per HD -Currenlty on midodrine for hypotension   -Unable to add beta blocker or nitrates, no ACEI/ARB/ARNI   3. DM2: -HbA1c, 4.6  -On PTA glimepiride  -SSI for glucose control while inpatient   4. OSA: -Continue with CPAP  5. ESRD: -HD performed yesterday  -Nephrology following    6. Anemia of chronic disease: -Hb, 8.2 today however baseline appears to be in the 8.0-9.0 range   Signed, Kathyrn Drown NP-C [place] Pager: [phone] 01/06/2021, 8:48 AM     For questions or updates, please contact   Please consult [email or web address] for contact info under Cardiology/STEMI.

## 2021-01-05 NOTE — Progress Notes (Signed)
    Pt has been rescheduled for TEE/DCCV on 01/07/21 at [place] NP-C [place] Pager: [phone]

## 2021-01-05 NOTE — Progress Notes (Signed)
[facility] Progress Note  Patient: [name]    LKG:[id number]  DOA: 12/30/2020     Date of Service: the patient was seen and examined on 01/05/2021  Brief hospital course: Type II DM, ESRD, HTN, multiple myeloma, sick sinus syndrome SP PPM implant, a flutter.  Presents with complaints of shortness of breathFound to have atrial flutter with worsening cardiomyopathy. Currently plan is   Follow-up Cardiology recommendation..  Assessment and Plan: 1.  Acute respiratory failure with hypoxia, POA Acute on chronic combined diastolic and systolic CHF Volume overload Presents with shortness of breath. Required 4 L of oxygen on admission. Cardiology and nephrology were consulted. After volume removal with HD oxygenation currently improved.  2.  Persistent atypical atrial flutter Appreciate cardiology consultation. On Eliquis. Plan for TEE cardioversion were delayed due to poor peripheral IV access. Currently scheduled for TEE cardioversion on 3/25. Echocardiogram showed EF dropping from 40 to 45% to 25 to 30%. Chest x-ray showed large pleural effusion. Outpatient work-up by cardiology for cardiomyopathy.  3.  ESRD on HD TTS Anemia of chronic kidney disease Hyperparathyroidism Appreciate nephrology assistance and volume removal. Management per nephrology H&H relatively on a lower side but stable.  If hemoglobin less than 7 patient agreeable for transfusion.  No active bleeding so far.  4.  Hypotension associated with HD Currently on midodrine for BP support. Monitor.  5.  Type 2 diabetes mellitus, uncontrolled with hyperglycemia with renal complication Currently on sliding scale insulin. Monitor.  6.  History of multiple myeloma On Decadron.  Which is currently continued.  7.  Severe protein calorie malnutrition. Dietitian following. Continue protein supplement. Body mass index is 18.71 kg/m.  Nutrition Problem: Severe Malnutrition Etiology: chronic illness (ESRD  on HD) Interventions: Interventions: Nepro shake,MVI,Liberalize Diet      Diet: Renal diet DVT Prophylaxis:   apixaban (ELIQUIS) tablet 2.5 mg Start: 12/30/20 2200 apixaban (ELIQUIS) tablet 2.5 mg    Advance goals of care discussion: Full code  Family Communication: no family was present at bedside, at the time of interview.   Disposition:  Status is: Inpatient  Remains inpatient appropriate because:Ongoing diagnostic testing needed not appropriate for outpatient work up   Dispo: The patient is from: Home              Anticipated d/c is to: Home              Patient currently is not medically stable to d/c.   Difficult to place patient No        Subjective: No nausea no vomiting.  No fever no chills.  No cough no shortness of breath.  No chest pain.  Tolerating oral diet.  Physical Exam:  General: Appear in mild distress, no Rash; Oral Mucosa Clear, moist. no Abnormal Neck Mass Or lumps, Conjunctiva normal  Cardiovascular: S1 and S2 Present, aortic systolic  Murmur, Respiratory: good respiratory effort, Bilateral Air entry present and CTA, no Crackles, no wheezes Abdomen: Bowel Sound present, Soft and no tenderness Extremities: no Pedal edema Neurology: alert and oriented to time, place, and person affect appropriate. no new focal deficit Gait not checked due to patient safety concerns  Vitals:   01/05/21 0511 01/05/21 0839 01/05/21 1043 01/05/21 1630  BP: 113/61 (!) 133/33 116/63 102/63  Pulse: 75 75 76 75  Resp: _0 Temp: 98.2 F (36.8 C) 97.7 F (36.5 C) 97.9 F (36.6 C) 98 F (36.7 C)  TempSrc:  Oral    SpO2: 99% 100%  100% 100%  Weight:      Height:        Intake/Output Summary (Last 24 hours) at 01/05/2021 1804 Last data filed at 01/05/2021 1300 Gross per 24 hour  Intake 540 ml  Output 0 ml  Net 540 ml   Filed Weights   01/04/21 0730 01/04/21 1140 01/04/21 2127  Weight: 52.5 kg 51 kg 51 kg    Data Reviewed: I have personally  reviewed and interpreted daily labs, tele strips, imaging. I reviewed all nursing notes, pharmacy notes, vitals, pertinent old records I have discussed plan of care as described above with RN and patient/family.  CBC: Recent Labs  Lab 12/30/20 1547 12/30/20 2303 01/01/21 0934 01/02/21 0559 01/03/21 1102 01/04/21 0307 01/05/21 0233  WBC 5.7   < > 5.7 5.5 7.3 6.4 7.2  NEUTROABS 3.5  --  4.3 3.8 4.9  --   --   HGB 9.7*   < > 8.8* 7.1* 7.5* 7.1* 7.1*  HCT 28.6*   < > 25.5* 20.7* 22.9* 21.7* 21.2*  MCV 93.5   < > 94.1 94.5 97.4 98.6 96.4  PLT 181   < > 122* 112* 127* 136* 124*   < > = values in this interval not displayed.   Basic Metabolic Panel: Recent Labs  Lab 12/30/20 1547 12/30/20 2303 12/31/20 0236 01/01/21 0934 01/02/21 0559 01/02/21 1458 01/03/21 1102 01/04/21 0307 01/05/21 0233  NA 136 135   < > 134* 135  --  134* 132* 132*  K 3.3* 3.5   < > 4.0 2.9* 4.0 3.8 3.9 3.9  CL 96* 97*   < > 97* 98  --  101 98 99  CO2 30 30   < > 30 31  --  _0 GLUCOSE 80 95   < > 122* 110*  --  98 73 90  BUN 9 11   < > 8 6*  --  24* 35* 17  CREATININE 1.72* 2.03*   < > 1.98* 1.12*  --  2.19* 2.43* 1.56*  CALCIUM 9.1 8.3*   < > 8.4* 8.1*  --  8.3* 8.2* 7.9*  MG 2.0  --   --   --   --   --   --  1.9 1.8  PHOS  --  2.6  --   --   --   --   --   --   --    < > = values in this interval not displayed.    Studies: No results found.  Scheduled Meds: . (feeding supplement) PROSource Plus  30 mL Oral TID BM  . acyclovir  400 mg Oral Daily  . allopurinol  50 mg Oral Daily  . apixaban  2.5 mg Oral BID  . Chlorhexidine Gluconate Cloth  6 each Topical Q0600  . cholecalciferol  2,000 Units Oral Daily  . darbepoetin (ARANESP) injection - DIALYSIS  50 mcg Intravenous Q Tue-HD  . dexamethasone  2 mg Oral Daily  . dorzolamide  1 drop Left Eye BID  . doxercalciferol  1 mcg Intravenous Once per day on Tue Thu  . feeding supplement (NEPRO CARB STEADY)  237 mL Oral BID BM  . fluorometholone   1 drop Right Eye Daily  . insulin aspart  0-6 Units Subcutaneous TID WC  . lactulose  10 g Oral TID  . latanoprost  1 drop Left Eye QHS  . metoCLOPramide  5 mg Oral TID AC  . midodrine  10 mg Oral  Q T,Th,Sat-1800  . multivitamin  1 tablet Oral QHS  . polyethylene glycol  17 g Oral Daily  . pregabalin  50 mg Oral QHS  . senna-docusate  2 tablet Oral BID   Continuous Infusions: PRN Meds: acetaminophen **OR** acetaminophen, lidocaine-prilocaine, ondansetron **OR** ondansetron (ZOFRAN) IV, oxyCODONE, pantoprazole  Time spent: 35 minutes  Author: Berle Mull, MD [facility] 01/05/2021 6:04 PM  To reach On-call, see care teams to locate the attending and reach out via [email or web address]. Between 7PM-7AM, please contact night-coverage If you still have difficulty reaching the attending provider, please page the [facility] (Director on Call) for [facility] on amion for assistance.

## 2021-01-05 NOTE — Plan of Care (Signed)
  Problem: Health Behavior/Discharge Planning: Goal: Ability to manage health-related needs will improve Outcome: Progressing   Problem: Elimination: Goal: Will not experience complications related to bowel motility Outcome: Progressing   Problem: Pain Managment: Goal: General experience of comfort will improve Outcome: Progressing   Problem: Safety: Goal: Ability to remain free from injury will improve Outcome: Progressing

## 2021-01-05 NOTE — Anesthesia Preprocedure Evaluation (Deleted)
Anesthesia Evaluation  Patient identified by MRN, date of birth, ID band Patient awake    Reviewed: Allergy & Precautions, NPO status , Patient's Chart, lab work & pertinent test results  Airway Mallampati: II       Dental  (+) Partial Upper, Partial Lower   Pulmonary sleep apnea ,    Pulmonary exam normal        Cardiovascular hypertension, + pacemaker  Rate:Tachycardia     Neuro/Psych  Neuromuscular disease negative psych ROS   GI/Hepatic Neg liver ROS, GERD  Medicated,  Endo/Other  diabetes, Type 2, Oral Hypoglycemic Agents  Renal/GU ESRF and DialysisRenal disease     Musculoskeletal  (+) Arthritis ,   Abdominal Normal abdominal exam  (+)   Peds  Hematology  (+) Blood dyscrasia, anemia ,   Anesthesia Other Findings   1. When compared to the prior study from 04/02/2021 LVEF is significantly  worse with diffuse hypokinesis and severe dyssynchrony secondary to RV  pacing. LVEF 25-30%, previously 40-45%.  2. Left ventricular ejection fraction, by estimation, is 25 to 30%. The  left ventricle has severely decreased function. The left ventricle  demonstrates global hypokinesis. There is moderate concentric left  ventricular hypertrophy. Left ventricular  diastolic parameters are consistent with Grade II diastolic dysfunction  (pseudonormalization). Elevated left atrial pressure.  3. Right ventricular systolic function is normal. The right ventricular  size is normal. There is severely elevated pulmonary artery systolic  pressure. The estimated right ventricular systolic pressure is 83.1 mmHg.  4. Left atrial size was moderately dilated.  5. Right atrial size was mildly dilated.  6. A small pericardial effusion is present. The pericardial effusion is  circumferential. Large pleural effusion in the left lateral region.  7. The mitral valve is normal in structure. Moderate mitral valve  regurgitation. No  evidence of mitral stenosis.  8. Tricuspid valve regurgitation is severe.  9. The aortic valve is normal in structure. Aortic valve regurgitation is  not visualized. Mild to moderate aortic valve sclerosis/calcification is  present, without any evidence of aortic stenosis. Aortic valve mean  gradient measures 8.0 mmHg.  10. Mild pulmonic stenosis.  11. The inferior vena cava is dilated in size with <50% respiratory  variability, suggesting right atrial pressure of 15 mmHg.       Remote device check reviewed. Normal device function. Battery status, leads stable. Histograms reviewed and appropriate.  Routine follow-up     MyChart Results Release  MyChart Status: Active Results Release   Indications  Sick sinus syndrome ([place]) (I49.5 (ICD-10-CM))  Conclusion  Scheduled remote reviewed. Normal device function.   Alert for persistent atrial flutter since 09/24/2020. According to previous reports the patient was sent to the AF clinic to consider [place]. There is good ventricular rate control. Sent to triage for persistent atrial arrhythmia.  Next remote 91 days.   Kathy Breach, RN, CCDS    Reproductive/Obstetrics                            Anesthesia Physical Anesthesia Plan  ASA: III  Anesthesia Plan:    Post-op Pain Management:    Induction: Intravenous  PONV Risk Score and Plan: Propofol infusion  Airway Management Planned: Mask and Natural Airway  Additional Equipment: TEE  Intra-op Plan:   Post-operative Plan:   Informed Consent: I have reviewed the patients History and Physical, chart, labs and discussed the procedure including the risks, benefits and alternatives for the  proposed anesthesia with the patient or authorized representative who has indicated his/her understanding and acceptance.     Dental advisory given  Plan Discussed with: CRNA  Anesthesia Plan Comments:         Anesthesia Quick Evaluation

## 2021-01-05 NOTE — Progress Notes (Signed)
In pre-procedure for TEE/cardioversion, pt arrived with nonfunctioning PIV.  Unable to obtain IV access after numerous attempts by staff and anesthesia staff.  Case canceled and patient returned to 5M07.  Case can be rescheduled after patient has functioning IV.  Vista Lawman, RN

## 2021-01-05 NOTE — Progress Notes (Signed)
Brief cardiology note today:  Awaiting TEE-CV today. If she is in sinus rhythm post cardioversion, she would be ready for discharge from a CV perspective, though defer to primary team as to whether she is medically ready for discharge.   Only CV medication is apixaban 2.5 mg BID (very important to not miss any doses given plan for cardioversion today, at increased thromboembolic risk after cardioversion).  We will get her close follow up in afib clinic post cardioversion.  Buford Dresser, MD, PhD, [place]  [address], [place], [place] [address] [phone]

## 2021-01-05 NOTE — Progress Notes (Signed)
Subjective: No complaints eating lunch tolerated 1.5 L UF yest. with HD, this a.m.TEE-CV evidently not done per patient "could not get IV access "noted cardiology rescheduled for 3/25  Objective Vital signs in last 24 hours: Vitals:   01/04/21 2127 01/05/21 0511 01/05/21 0839 01/05/21 1043  BP: 110/60 113/61 (!) 133/33 116/63  Pulse: 75 75 75 76  Resp: 16 15 15 17   Temp: 98.4 F (36.9 C) 98.2 F (36.8 C) 97.7 F (36.5 C) 97.9 F (36.6 C)  TempSrc:   Oral   SpO2: 98% 99% 100% 100%  Weight: 51 kg     Height:       Weight change: 2 kg  Physical Exam: General:Alert elderly thin female NAD Heart: RRR, no MRG Lungs:CTA nonlabored breathing Abdomen:Bowel sounds normoactive, soft, NTND Extremities:No pedal edema Dialysis Access: Patent on HD right arm AV fistula  OP dialysis Orders: [place]. TTS. 4hrs,3K,2.5Ca, EDW49 kg heparin 2k bolus Hectorol 1 mic Mircera 50 MCG q 2 weeks , not yet given at outpatient center  right upper arm AV fistula   Problem/Plan  1.Acute Respiratory Failure:Volume overload and relatedA flutter,volume issue resolved now, UF with HD  follow-up standing weights for possible lower dry weight at discharge 2.  ESRD- HD TTS:On schedule  last K+ 3.9 use 3K bath with HD   3. .Atrial flutter cardiology following ,  noted rescheduled for TEE/DCCV 3/25 1 PM , plans for cardioversion   4 Worsening  CM-Per echo with EF 25 to 30% compared to prior 40 to 45% multiple effusion with small pericardial effusion 5HTN/volume-Patient's volume status resolved with HD on admit, a.m. BP stable with midodrine for BP supportforHD, slightly decreased EDW at time of discharge / needs standing weight 6. Anemiaof CKD-Hgb trending downward; 7.1 <7.5<7.1.Tsat 37, Iron 63, andferritin 1226.Iron series not indicated at this time.GivenAranesp50 mcg 3/22 weeklyHD Tuesday  7Secondaryhyperparathyroidism-CorrectedCa at 8.9.,,Phos 2.6 no  binder, onHectorol 22mcg twice weekly. 8. Nutrition-ALB 3.1, on Regular Diet , ,okay 2/2 low K and low phosphorus,with  fluid restrictions ,on  Prosourse protein supplement  Ernest Haber, PA-C [facility] [phone] 01/05/2021,12:25 PM  LOS: 5 days   Labs: Basic Metabolic Panel: Recent Labs  Lab 12/30/20 2303 12/31/20 0236 01/03/21 1102 01/04/21 0307 01/05/21 0233  NA 135   < > 134* 132* 132*  K 3.5   < > 3.8 3.9 3.9  CL 97*   < > 101 98 99  CO2 30   < > 28 25 28   GLUCOSE 95   < > 98 73 90  BUN 11   < > 24* 35* 17  CREATININE 2.03*   < > 2.19* 2.43* 1.56*  CALCIUM 8.3*   < > 8.3* 8.2* 7.9*  PHOS 2.6  --   --   --   --    < > = values in this interval not displayed.   Liver Function Tests: Recent Labs  Lab 12/30/20 1547 12/30/20 2303  AST 28  --   ALT 18  --   ALKPHOS 44  --   BILITOT 1.7*  --   PROT 6.0*  --   ALBUMIN 4.0 3.1*   No results for input(s): LIPASE, AMYLASE in the last 168 hours. No results for input(s): AMMONIA in the last 168 hours. CBC: Recent Labs  Lab 01/01/21 0934 01/02/21 0559 01/03/21 1102 01/04/21 0307 01/05/21 0233  WBC 5.7 5.5 7.3 6.4 7.2  NEUTROABS 4.3 3.8 4.9  --   --   HGB  8.8* 7.1* 7.5* 7.1* 7.1*  HCT 25.5* 20.7* 22.9* 21.7* 21.2*  MCV 94.1 94.5 97.4 98.6 96.4  PLT 122* 112* 127* 136* 124*   Cardiac Enzymes: No results for input(s): CKTOTAL, CKMB, CKMBINDEX, TROPONINI in the last 168 hours. CBG: Recent Labs  Lab 01/04/21 1641 01/04/21 1754 01/04/21 2127 01/05/21 0640 01/05/21 1123  GLUCAP 122* 116* 124* 77 70    Studies/Results: No results found. Medications:  . (feeding supplement) PROSource Plus  30 mL Oral TID BM  . acyclovir  400 mg Oral Daily  . allopurinol  50 mg Oral Daily  . apixaban  2.5 mg Oral BID  . Chlorhexidine Gluconate Cloth  6 each Topical Q0600  . cholecalciferol  2,000 Units Oral Daily  . darbepoetin (ARANESP) injection - DIALYSIS  50 mcg Intravenous Q Tue-HD   . dexamethasone  2 mg Oral Daily  . dorzolamide  1 drop Left Eye BID  . doxercalciferol  1 mcg Intravenous Once per day on Tue Thu  . feeding supplement (NEPRO CARB STEADY)  237 mL Oral BID BM  . fluorometholone  1 drop Right Eye Daily  . insulin aspart  0-6 Units Subcutaneous TID WC  . lactulose  10 g Oral TID  . latanoprost  1 drop Left Eye QHS  . metoCLOPramide  5 mg Oral TID AC  . midodrine  10 mg Oral Q T,Th,Sat-1800  . multivitamin  1 tablet Oral QHS  . polyethylene glycol  17 g Oral Daily  . pregabalin  50 mg Oral QHS  . senna-docusate  2 tablet Oral BID

## 2021-01-06 ENCOUNTER — Telehealth: Payer: Self-pay

## 2021-01-06 DIAGNOSIS — I429 Cardiomyopathy, unspecified: Secondary | ICD-10-CM

## 2021-01-06 DIAGNOSIS — R0602 Shortness of breath: Secondary | ICD-10-CM

## 2021-01-06 DIAGNOSIS — N186 End stage renal disease: Secondary | ICD-10-CM | POA: Diagnosis not present

## 2021-01-06 DIAGNOSIS — I484 Atypical atrial flutter: Secondary | ICD-10-CM | POA: Diagnosis not present

## 2021-01-06 LAB — CBC
HCT: 25.1 % — ABNORMAL LOW (ref 36.0–46.0)
Hemoglobin: 8.2 g/dL — ABNORMAL LOW (ref 12.0–15.0)
MCH: 32 pg (ref 26.0–34.0)
MCHC: 32.7 g/dL (ref 30.0–36.0)
MCV: 98 fL (ref 80.0–100.0)
Platelets: 162 10*3/uL (ref 150–400)
RBC: 2.56 MIL/uL — ABNORMAL LOW (ref 3.87–5.11)
RDW: 17.7 % — ABNORMAL HIGH (ref 11.5–15.5)
WBC: 6.4 10*3/uL (ref 4.0–10.5)
nRBC: 0 % (ref 0.0–0.2)

## 2021-01-06 LAB — GLUCOSE, CAPILLARY
Glucose-Capillary: 90 mg/dL (ref 70–99)
Glucose-Capillary: 115 mg/dL — ABNORMAL HIGH (ref 70–99)
Glucose-Capillary: 109 mg/dL — ABNORMAL HIGH (ref 70–99)
Glucose-Capillary: 146 mg/dL — ABNORMAL HIGH (ref 70–99)

## 2021-01-06 LAB — RENAL FUNCTION PANEL
Albumin: 3.1 g/dL — ABNORMAL LOW (ref 3.5–5.0)
Anion gap: 8 (ref 5–15)
BUN: 29 mg/dL — ABNORMAL HIGH (ref 8–23)
CO2: 28 mmol/L (ref 22–32)
Calcium: 8.5 mg/dL — ABNORMAL LOW (ref 8.9–10.3)
Chloride: 97 mmol/L — ABNORMAL LOW (ref 98–111)
Creatinine, Ser: 2.25 mg/dL — ABNORMAL HIGH (ref 0.44–1.00)
GFR, Estimated: 20 mL/min — ABNORMAL LOW (ref >60)
Glucose, Bld: 87 mg/dL (ref 70–99)
Phosphorus: 2.6 mg/dL (ref 2.5–4.6)
Potassium: 3.8 mmol/L (ref 3.5–5.1)
Sodium: 133 mmol/L — ABNORMAL LOW (ref 135–145)

## 2021-01-06 MED ORDER — DOXERCALCIFEROL 4 MCG/2ML IV SOLN
INTRAVENOUS | Status: AC
  Administered 2021-01-06: 1 ug via INTRAVENOUS
  Filled 2021-01-06: qty 2

## 2021-01-06 MED ORDER — LORAZEPAM 0.5 MG PO TABS
0.5000 mg | ORAL_TABLET | Freq: Once | ORAL | Status: AC | PRN
  Administered 2021-01-06: 0.5 mg via ORAL
  Filled 2021-01-06: qty 1

## 2021-01-06 NOTE — Progress Notes (Signed)
Subjective: No complaints for HD today on schedule  Objective Vital signs in last 24 hours: Vitals:   01/05/21 1630 01/05/21 2056 01/06/21 0521 01/06/21 0939  BP: 102/63 (!) 107/47 (!) 107/57 (!) 102/54  Pulse: 75 75 75 78  Resp: 18 17 18 17   Temp: 98 F (36.7 C) 98.4 F (36.9 C) 98.2 F (36.8 C) 97.9 F (36.6 C)  TempSrc:    Oral  SpO2: 100% 99% 99% 100%  Weight:  51 kg    Height:       Weight change: -1.46 kg  Physical Exam: General:Alert elderly thin female NAD Heart:RRR, noMRG Lungs:CTA nonlabored breathing Abdomen:Bowel sounds normoactive, soft, NTND Extremities:No pedal edema Dialysis Access:Positive bruit RUA AVF  OP dialysis Orders: [place]. TTS. 4hrs,3K,2.5Ca, EDW49 kg heparin 2k bolus Hectorol 1 mic Mircera 44 MCGq2 weeks,not yet given at outpatient center  right upper arm AV fistula   Problem/Plan  1.Acute Respiratory Failure:Volume overload and relatedA flutter,volume issue resolved now, UF with HDfollow-up standing weights for possible lower dry weight at discharge 2. ESRD- HD TTS:On schedule last K+3.8 use 3K bath with HD 3..Atrial fluttercardiology following, noted rescheduled for TEE/DCCV 3/25 1 PM , plans for cardioversion 4WorseningCM-Per echo with EF 25 to 30% compared to prior 40 to 45% multiple effusion with small pericardial effusion 5HTN/volume-Patient's volume statusresolved with HD on admit,a.m. BP stable with midodrine for BP supportforHD, slightly decreased EDW at time of discharge/ needs standing weight 6. Anemiaof CKD-Hgb  8.2< 7.1 <7.5<7.1.Tsat 37, Iron 63, andferritin 1226.Iron series not indicated at this time.GivenAranesp50 mcg 3/22 weeklyHD Tuesday 7Secondaryhyperparathyroidism-CorrectedCa at 9.2.,,Phos 2.6 no binder, onHectorol 1mcg twice weekly. 8. Nutrition-ALB 3.1, onRegular Diet  ,okay 2/2 low K and low phosphorus,withfluid restrictions  ,onProsourse protein supplement  Ernest Haber, PA-C [facility] [phone] 01/06/2021,12:38 PM  LOS: 6 days   Labs: Basic Metabolic Panel: Recent Labs  Lab 12/30/20 2303 12/31/20 0236 01/04/21 0307 01/05/21 0233 01/06/21 0741  NA 135   < > 132* 132* 133*  K 3.5   < > 3.9 3.9 3.8  CL 97*   < > 98 99 97*  CO2 30   < > 25 28 28   GLUCOSE 95   < > 73 90 87  BUN 11   < > 35* 17 29*  CREATININE 2.03*   < > 2.43* 1.56* 2.25*  CALCIUM 8.3*   < > 8.2* 7.9* 8.5*  PHOS 2.6  --   --   --  2.6   < > = values in this interval not displayed.   Liver Function Tests: Recent Labs  Lab 12/30/20 1547 12/30/20 2303 01/06/21 0741  AST 28  --   --   ALT 18  --   --   ALKPHOS 44  --   --   BILITOT 1.7*  --   --   PROT 6.0*  --   --   ALBUMIN 4.0 3.1* 3.1*   No results for input(s): LIPASE, AMYLASE in the last 168 hours. No results for input(s): AMMONIA in the last 168 hours. CBC: Recent Labs  Lab 01/01/21 0934 01/02/21 0559 01/03/21 1102 01/04/21 0307 01/05/21 0233 01/06/21 0741  WBC 5.7 5.5 7.3 6.4 7.2 6.4  NEUTROABS 4.3 3.8 4.9  --   --   --   HGB 8.8* 7.1* 7.5* 7.1* 7.1* 8.2*  HCT 25.5* 20.7* 22.9* 21.7* 21.2* 25.1*  MCV 94.1 94.5 97.4 98.6 96.4 98.0  PLT 122* 112* 127* 136* 124* 162  Cardiac Enzymes: No results for input(s): CKTOTAL, CKMB, CKMBINDEX, TROPONINI in the last 168 hours. CBG: Recent Labs  Lab 01/05/21 1123 01/05/21 1630 01/05/21 2056 01/06/21 0630 01/06/21 1142  GLUCAP 70 137* 159* 90 115*    Studies/Results: No results found. Medications:  . (feeding supplement) PROSource Plus  30 mL Oral TID BM  . acyclovir  400 mg Oral Daily  . allopurinol  50 mg Oral Daily  . apixaban  2.5 mg Oral BID  . Chlorhexidine Gluconate Cloth  6 each Topical Q0600  . cholecalciferol  2,000 Units Oral Daily  . darbepoetin (ARANESP) injection - DIALYSIS  50 mcg Intravenous Q Tue-HD  . dexamethasone  2 mg Oral Daily  . dorzolamide  1 drop Left  Eye BID  . doxercalciferol  1 mcg Intravenous Once per day on Tue Thu  . feeding supplement (NEPRO CARB STEADY)  237 mL Oral BID BM  . fluorometholone  1 drop Right Eye Daily  . insulin aspart  0-6 Units Subcutaneous TID WC  . lactulose  10 g Oral TID  . latanoprost  1 drop Left Eye QHS  . metoCLOPramide  5 mg Oral TID AC  . midodrine  10 mg Oral Q T,Th,Sat-1800  . multivitamin  1 tablet Oral QHS  . polyethylene glycol  17 g Oral Daily  . pregabalin  50 mg Oral QHS  . senna-docusate  2 tablet Oral BID

## 2021-01-06 NOTE — Progress Notes (Signed)
[facility] Progress Note  Patient: [name]    HDQ:[id number]  DOA: 12/30/2020     Date of Service: the patient was seen and examined on 01/06/2021  Brief hospital course: Type II DM, ESRD, HTN, multiple myeloma, sick sinus syndrome SP PPM implant, a flutter.  Presents with complaints of shortness of breathFound to have atrial flutter with worsening cardiomyopathy. Currently plan is for TEE cardioversion on 3/25.  Assessment and Plan: 1.  Acute respiratory failure with hypoxia, POA Acute on chronic combined diastolic and systolic CHF Volume overload Presents with shortness of breath. Required 4 L of oxygen on admission. Cardiology and nephrology were consulted. After volume removal with HD oxygenation currently improved.  2.  Persistent atypical atrial flutter Appreciate cardiology consultation. On Eliquis. Plan for TEE cardioversion were delayed due to poor peripheral IV access. Currently scheduled for TEE cardioversion on 3/25. Echocardiogram showed EF dropping from 40 to 45% to 25 to 30%. Chest x-ray showed large pleural effusion. Outpatient work-up by cardiology for cardiomyopathy.  3.  ESRD on HD TTS Anemia of chronic kidney disease Hyperparathyroidism Appreciate nephrology assistance and volume removal. Management per nephrology H&H relatively on a lower side but stable.  If hemoglobin less than 7 patient agreeable for transfusion.  No active bleeding so far.  4.  Hypotension associated with HD Currently on midodrine for BP support. Monitor.  5.  Type 2 diabetes mellitus, uncontrolled with hyperglycemia with renal complication Currently on sliding scale insulin. Monitor.  6.  History of multiple myeloma On Decadron.  Which is currently continued.  7.  Severe protein calorie malnutrition. Dietitian following. Continue protein supplement. Body mass index is 18.42 kg/m (pended).  Nutrition Problem: Severe Malnutrition Etiology: chronic illness  (ESRD on HD) Interventions: Interventions: Nepro shake,MVI,Liberalize Diet  Diet: Renal diet DVT Prophylaxis:   apixaban (ELIQUIS) tablet 2.5 mg Start: 12/30/20 2200 apixaban (ELIQUIS) tablet 2.5 mg    Advance goals of care discussion: Full code  Family Communication: no family was present at bedside, at the time of interview.   Disposition:  Status is: Inpatient  Remains inpatient appropriate because:IV treatments appropriate due to intensity of illness or inability to take PO  Dispo: The patient is from: Home              Anticipated d/c is to: Home              Patient currently is not medically stable to d/c.   Difficult to place patient No  Subjective: .  No nausea no vomiting vertigo no fever no chills.  No chest pain.  No shortness of breath.  Physical Exam:  General: Appear in mild distress, no Rash; Oral Mucosa Clear, moist. no Abnormal Neck Mass Or lumps, Conjunctiva normal  Cardiovascular: S1 and S2 Present, aortic systolic  Murmur, Respiratory: good respiratory effort, Bilateral Air entry present and CTA, no Crackles, no wheezes Abdomen: Bowel Sound present, Soft and no tenderness Extremities: no Pedal edema Neurology: alert and oriented to time, place, and person affect appropriate. no new focal deficit Gait not checked due to patient safety concerns  Vitals:   01/06/21 1700 01/06/21 1730 01/06/21 1736 01/06/21 1808  BP: (!) 128/56 (!) 123/55 (!) (P) 118/56 (!) 107/50  Pulse: 78 71 (P) 79 79  Resp:   (P) 14 17  Temp:   (P) 97.8 F (36.6 C) 98.2 F (36.8 C)  TempSrc:   (P) Oral   SpO2:   (P) 97% 100%  Weight:   (P) 50.2  kg   Height:        Intake/Output Summary (Last 24 hours) at 01/06/2021 1917 Last data filed at 01/06/2021 1736 Gross per 24 hour  Intake 800 ml  Output 1000 ml  Net -200 ml   Filed Weights   01/05/21 2056 01/06/21 1356 01/06/21 1736  Weight: 51 kg 51.6 kg (P) 50.2 kg    Data Reviewed: I have personally reviewed and interpreted  daily labs, tele strips, imaging. I reviewed all nursing notes, pharmacy notes, vitals, pertinent old records I have discussed plan of care as described above with RN and patient/family.  CBC: Recent Labs  Lab 01/01/21 0934 01/02/21 0559 01/03/21 1102 01/04/21 0307 01/05/21 0233 01/06/21 0741  WBC 5.7 5.5 7.3 6.4 7.2 6.4  NEUTROABS 4.3 3.8 4.9  --   --   --   HGB 8.8* 7.1* 7.5* 7.1* 7.1* 8.2*  HCT 25.5* 20.7* 22.9* 21.7* 21.2* 25.1*  MCV 94.1 94.5 97.4 98.6 96.4 98.0  PLT 122* 112* 127* 136* 124* 778   Basic Metabolic Panel: Recent Labs  Lab 12/30/20 2303 12/31/20 0236 01/02/21 0559 01/02/21 1458 01/03/21 1102 01/04/21 0307 01/05/21 0233 01/06/21 0741  NA 135   < > 135  --  134* 132* 132* 133*  K 3.5   < > 2.9* 4.0 3.8 3.9 3.9 3.8  CL 97*   < > 98  --  101 98 99 97*  CO2 30   < > 31  --  _0 GLUCOSE 95   < > 110*  --  98 73 90 87  BUN 11   < > 6*  --  24* 35* 17 29*  CREATININE 2.03*   < > 1.12*  --  2.19* 2.43* 1.56* 2.25*  CALCIUM 8.3*   < > 8.1*  --  8.3* 8.2* 7.9* 8.5*  MG  --   --   --   --   --  1.9 1.8  --   PHOS 2.6  --   --   --   --   --   --  2.6   < > = values in this interval not displayed.    Studies: No results found.  Scheduled Meds: . (feeding supplement) PROSource Plus  30 mL Oral TID BM  . acyclovir  400 mg Oral Daily  . allopurinol  50 mg Oral Daily  . apixaban  2.5 mg Oral BID  . Chlorhexidine Gluconate Cloth  6 each Topical Q0600  . cholecalciferol  2,000 Units Oral Daily  . darbepoetin (ARANESP) injection - DIALYSIS  50 mcg Intravenous Q Tue-HD  . dexamethasone  2 mg Oral Daily  . dorzolamide  1 drop Left Eye BID  . doxercalciferol  1 mcg Intravenous Once per day on Tue Thu  . feeding supplement (NEPRO CARB STEADY)  237 mL Oral BID BM  . fluorometholone  1 drop Right Eye Daily  . insulin aspart  0-6 Units Subcutaneous TID WC  . lactulose  10 g Oral TID  . latanoprost  1 drop Left Eye QHS  . metoCLOPramide  5 mg Oral TID AC   . midodrine  10 mg Oral Q T,Th,Sat-1800  . multivitamin  1 tablet Oral QHS  . polyethylene glycol  17 g Oral Daily  . pregabalin  50 mg Oral QHS  . senna-docusate  2 tablet Oral BID   Continuous Infusions: PRN Meds: acetaminophen **OR** acetaminophen, lidocaine-prilocaine, ondansetron **OR** ondansetron (ZOFRAN) IV, oxyCODONE, pantoprazole  Time spent:  35 minutes  Author: Berle Mull, MD [facility] 01/06/2021 7:17 PM  To reach On-call, see care teams to locate the attending and reach out via [email or web address]. Between 7PM-7AM, please contact night-coverage If you still have difficulty reaching the attending provider, please page the [facility] (Director on Call) for [facility] on amion for assistance.

## 2021-01-06 NOTE — Telephone Encounter (Signed)
Called and given below message to son. He verbalized understanding . ?

## 2021-01-06 NOTE — Plan of Care (Signed)
  Problem: Health Behavior/Discharge Planning: Goal: Ability to manage health-related needs will improve Outcome: Progressing   Problem: Clinical Measurements: Goal: Cardiovascular complication will be avoided Outcome: Progressing   Problem: Elimination: Goal: Will not experience complications related to bowel motility Outcome: Progressing

## 2021-01-06 NOTE — Telephone Encounter (Signed)
I cancelled them Advise her son to call a few days after DC and we will reschedule

## 2021-01-06 NOTE — Telephone Encounter (Signed)
Son called and left a message. [name] is in the hospital. She is for cardioversion and dialysis this afternoon. She will probably be admitted. He is asking should they cancel appt tomorrow at [facility]?

## 2021-01-06 NOTE — Progress Notes (Signed)
Progress Note  Patient Name: [name] Date of Encounter: 01/06/2021  [facility] Cardiologist: Jenkins Rouge, MD   Subjective   Remains mildly short of breath this AM. Has a L arm IV that she believes is functional. Plan for HD today, she is amenable to TEE-CV tomorrow.  Inpatient Medications    Scheduled Meds: . (feeding supplement) PROSource Plus  30 mL Oral TID BM  . acyclovir  400 mg Oral Daily  . allopurinol  50 mg Oral Daily  . apixaban  2.5 mg Oral BID  . Chlorhexidine Gluconate Cloth  6 each Topical Q0600  . cholecalciferol  2,000 Units Oral Daily  . darbepoetin (ARANESP) injection - DIALYSIS  50 mcg Intravenous Q Tue-HD  . dexamethasone  2 mg Oral Daily  . dorzolamide  1 drop Left Eye BID  . doxercalciferol  1 mcg Intravenous Once per day on Tue Thu  . feeding supplement (NEPRO CARB STEADY)  237 mL Oral BID BM  . fluorometholone  1 drop Right Eye Daily  . insulin aspart  0-6 Units Subcutaneous TID WC  . lactulose  10 g Oral TID  . latanoprost  1 drop Left Eye QHS  . metoCLOPramide  5 mg Oral TID AC  . midodrine  10 mg Oral Q T,Th,Sat-1800  . multivitamin  1 tablet Oral QHS  . polyethylene glycol  17 g Oral Daily  . pregabalin  50 mg Oral QHS  . senna-docusate  2 tablet Oral BID   Continuous Infusions:  PRN Meds: acetaminophen **OR** acetaminophen, lidocaine-prilocaine, ondansetron **OR** ondansetron (ZOFRAN) IV, oxyCODONE, pantoprazole   Vital Signs    Vitals:   01/06/21 0939 01/06/21 1356 01/06/21 1404 01/06/21 1415  BP: (!) 102/54 (!) 111/53 (!) 110/53 (!) 118/52  Pulse: 78 77 78 74  Resp: 17 16    Temp: 97.9 F (36.6 C) 98.5 F (36.9 C)    TempSrc: Oral Oral    SpO2: 100% 100%    Weight:  51.6 kg    Height:        Intake/Output Summary (Last 24 hours) at 01/06/2021 1437 Last data filed at 01/06/2021 1200 Gross per 24 hour  Intake 800 ml  Output 0 ml  Net 800 ml   Last 3 Weights 01/06/2021 01/05/2021 01/04/2021  Weight (lbs) 113 lb  12.1 oz 112 lb 8.4 oz 112 lb 7 oz  Weight (kg) 51.6 kg 51.04 kg 51 kg      Telemetry    Atrial flutter with controlled ventricular rate, frequently paced - Personally Reviewed  ECG    No new since 01/01/21 - Personally Reviewed  Physical Exam   GEN: frail appearing elderly woman, in no acute distress NECK: JVD at low neck CARDIAC: regular rhythm, normal S1 and S2, no rubs or gallops. 3/6 systolic murmur. VASCULAR: Radial pulses 2+ bilaterally.  RESPIRATORY:  Clear to auscultation in upper fields ABDOMEN: Soft, non-tender, non-distended MUSCULOSKELETAL:  Moves all 4 limbs independently SKIN: Warm and dry, no edema NEUROLOGIC:  No focal neuro deficits noted. PSYCHIATRIC:  Normal affect   Labs    High Sensitivity Troponin:   Recent Labs  Lab 12/30/20 1547 12/30/20 1740  TROPONINIHS 144* 123*      Chemistry Recent Labs  Lab 12/30/20 1547 12/30/20 2303 12/31/20 0236 01/04/21 0307 01/05/21 0233 01/06/21 0741  NA 136 135   < > 132* 132* 133*  K 3.3* 3.5   < > 3.9 3.9 3.8  CL 96* 97*   < > 98  99 97*  CO2 30 30   < > 25 28 28   GLUCOSE 80 95   < > 73 90 87  BUN 9 11   < > 35* 17 29*  CREATININE 1.72* 2.03*   < > 2.43* 1.56* 2.25*  CALCIUM 9.1 8.3*   < > 8.2* 7.9* 8.5*  PROT 6.0*  --   --   --   --   --   ALBUMIN 4.0 3.1*  --   --   --  3.1*  AST 28  --   --   --   --   --   ALT 18  --   --   --   --   --   ALKPHOS 44  --   --   --   --   --   BILITOT 1.7*  --   --   --   --   --   GFRNONAA 28* 23*   < > 19* 32* 20*  ANIONGAP 10 8   < > 9 5 8    < > = values in this interval not displayed.     Hematology Recent Labs  Lab 01/04/21 0307 01/05/21 0233 01/06/21 0741  WBC 6.4 7.2 6.4  RBC 2.20* 2.20* 2.56*  HGB 7.1* 7.1* 8.2*  HCT 21.7* 21.2* 25.1*  MCV 98.6 96.4 98.0  MCH 32.3 32.3 32.0  MCHC 32.7 33.5 32.7  RDW 17.2* 17.2* 17.7*  PLT 136* 124* 162    BNPNo results for input(s): BNP, PROBNP in the last 168 hours.   DDimer No results for input(s):  DDIMER in the last 168 hours.   Radiology    No results found.  Cardiac Studies   Echo 01/01/21 1. When compared to the prior study from 04/02/2021 LVEF is significantly  worse with diffuse hypokinesis and severe dyssynchrony secondary to RV  pacing. LVEF 25-30%, previously 40-45%.  2. Left ventricular ejection fraction, by estimation, is 25 to 30%. The  left ventricle has severely decreased function. The left ventricle  demonstrates global hypokinesis. There is moderate concentric left  ventricular hypertrophy. Left ventricular  diastolic parameters are consistent with Grade II diastolic dysfunction  (pseudonormalization). Elevated left atrial pressure.  3. Right ventricular systolic function is normal. The right ventricular  size is normal. There is severely elevated pulmonary artery systolic  pressure. The estimated right ventricular systolic pressure is 10.3 mmHg.  4. Left atrial size was moderately dilated.  5. Right atrial size was mildly dilated.  6. A small pericardial effusion is present. The pericardial effusion is  circumferential. Large pleural effusion in the left lateral region.  7. The mitral valve is normal in structure. Moderate mitral valve  regurgitation. No evidence of mitral stenosis.  8. Tricuspid valve regurgitation is severe.  9. The aortic valve is normal in structure. Aortic valve regurgitation is  not visualized. Mild to moderate aortic valve sclerosis/calcification is  present, without any evidence of aortic stenosis. Aortic valve mean  gradient measures 8.0 mmHg.  10. Mild pulmonic stenosis.  11. The inferior vena cava is dilated in size with <50% respiratory  variability, suggesting right atrial pressure of 15 mmHg.   2021 Echo  1. There is significant apical-basal and septal-lateral pacing-induced  dyssynchrony. Left ventricular ejection fraction, by estimation, is 40 to  45%. The left ventricle has mildly decreased function. The left  ventricle  demonstrates regional wall motion  abnormalities (see scoring diagram/findings for description). There is  moderate concentric left ventricular hypertrophy.  Left ventricular  diastolic parameters are consistent with Grade II diastolic dysfunction  (pseudonormalization). Elevated left atrial  pressure. There is moderate dyskinesis of the left ventricular, entire  apical segment, although this may be artifactual (pacing related).  2. Right ventricular systolic function is normal. The right ventricular  size is normal. Mildly increased right ventricular wall thickness. There  is mildly elevated pulmonary artery systolic pressure. The estimated right  ventricular systolic pressure is  08.1 mmHg.  3. Left atrial size was mildly dilated.  4. Right atrial size was mildly dilated.  5. The mitral valve is normal in structure. Mild mitral valve  regurgitation.  6. Tricuspid valve regurgitation is mild to moderate.  7. The aortic valve is tricuspid. Aortic valve regurgitation is trivial.  Very mild aortic valve stenosis.  8. The inferior vena cava is normal in size with greater than 50%  respiratory variability, suggesting right atrial pressure of 3 mmHg.   Patient Profile     87 y.o. female with PMH ESRD on HD, acute on chronic systolic and diastolic heart failure with worsened EF this admission, atypical atrial flutter, SSS s/p PPM admitted for worsening dyspnea on exertion.  Assessment & Plan    Atrial flutter, with controlled ventricular rate S/P PPM for SSS -based on MAR, did not receive 3/18 AM dose, 3/19 PM dose or apixaban -TEE-CV planned for 3/23, but after prolonged IV attempts, unable to get IV access and procedure had to be cancelled. Rescheduled for 3/25. We discussed attempting as an outpatient, but given how poorly she feels, I think it is better to have it done while inpatient, and she agrees. -she does endorse dyspnea on exertion, and with worsening EF would  try to optimize and get out of flutter -she is rate controlled on no agents and intermittently paced  Worsening Cardiomyopathy Acute on chronic systolic and diastolic heart failure ESRD on HD -echo 01/01/21 with EF reduced to 25-30%, prior 40-45% -small pericardial effusion, large pleural effusion -RVSP severely elevated at 65 mmHg. Severe TR. -volume removal per nephrology -on midodrine for hypotension. No BP room for beta blocker or nitrate/hydralazine, no ACEi/ARB/ARNI/MRA given ESRD.  Type II diabetes -no SGLT2i given ESRD  OSA: continue CPAP  For questions or updates, please contact [place] Please consult [email or web address] for contact info under     Signed, Buford Dresser, MD  01/06/2021, 2:37 PM

## 2021-01-07 ENCOUNTER — Encounter (HOSPITAL_COMMUNITY)
Admission: EM | Disposition: A | Discharge status: Home Health | Payer: Self-pay | Source: Home / Self Care | Attending: Internal Medicine

## 2021-01-07 ENCOUNTER — Inpatient Hospital Stay (HOSPITAL_COMMUNITY): Payer: Medicare PPO | Admitting: Certified Registered Nurse Anesthetist

## 2021-01-07 ENCOUNTER — Encounter (HOSPITAL_COMMUNITY): Payer: Self-pay | Admitting: Internal Medicine

## 2021-01-07 ENCOUNTER — Inpatient Hospital Stay (HOSPITAL_COMMUNITY): Payer: Medicare PPO

## 2021-01-07 ENCOUNTER — Encounter (HOSPITAL_COMMUNITY): Payer: Self-pay

## 2021-01-07 ENCOUNTER — Inpatient Hospital Stay: Payer: Medicare PPO

## 2021-01-07 ENCOUNTER — Other Ambulatory Visit: Payer: Self-pay

## 2021-01-07 ENCOUNTER — Inpatient Hospital Stay: Payer: Medicare PPO | Admitting: Hematology and Oncology

## 2021-01-07 DIAGNOSIS — I4891 Unspecified atrial fibrillation: Secondary | ICD-10-CM

## 2021-01-07 DIAGNOSIS — J9601 Acute respiratory failure with hypoxia: Secondary | ICD-10-CM | POA: Diagnosis not present

## 2021-01-07 DIAGNOSIS — I4819 Other persistent atrial fibrillation: Secondary | ICD-10-CM | POA: Diagnosis not present

## 2021-01-07 HISTORY — PX: TEE WITHOUT CARDIOVERSION: SHX5443

## 2021-01-07 HISTORY — PX: CARDIOVERSION: SHX1299

## 2021-01-07 HISTORY — PX: BUBBLE STUDY: SHX6837

## 2021-01-07 LAB — ECHO TEE
MV M vel: 4.45 m/s
MV Peak grad: 79.2 mmHg
Radius: 0.5 cm

## 2021-01-07 LAB — GLUCOSE, CAPILLARY
Glucose-Capillary: 138 mg/dL — ABNORMAL HIGH (ref 70–99)
Glucose-Capillary: 85 mg/dL (ref 70–99)
Glucose-Capillary: 81 mg/dL (ref 70–99)
Glucose-Capillary: 150 mg/dL — ABNORMAL HIGH (ref 70–99)

## 2021-01-07 SURGERY — ECHOCARDIOGRAM, TRANSESOPHAGEAL
Anesthesia: General

## 2021-01-07 MED ORDER — SODIUM CHLORIDE 0.9 % IV SOLN: INTRAVENOUS | Status: DC | PRN

## 2021-01-07 MED ORDER — PROSOURCE PLUS PO LIQD: 30.0000 mL | Freq: Three times a day (TID) | ORAL | 0 refills | Status: DC

## 2021-01-07 MED ORDER — PROPOFOL 500 MG/50ML IV EMUL
INTRAVENOUS | Status: DC | PRN
  Administered 2021-01-07: 75 ug/kg/min via INTRAVENOUS

## 2021-01-07 MED ORDER — LIDOCAINE 2% (20 MG/ML) 5 ML SYRINGE
INTRAMUSCULAR | Status: DC | PRN
  Administered 2021-01-07 (×2): 20 mg via INTRAVENOUS

## 2021-01-07 MED ORDER — NEPRO/CARBSTEADY PO LIQD: 237.0000 mL | Freq: Two times a day (BID) | ORAL | 0 refills | Status: AC

## 2021-01-07 MED ORDER — PROPOFOL 10 MG/ML IV BOLUS
INTRAVENOUS | Status: DC | PRN
  Administered 2021-01-07: 15 mg via INTRAVENOUS
  Administered 2021-01-07: 20 mg via INTRAVENOUS
  Administered 2021-01-07 (×2): 15 mg via INTRAVENOUS

## 2021-01-07 NOTE — Interval H&P Note (Signed)
History and Physical Interval Note:  01/07/2021 9:39 AM  [name]  has presented today for surgery, with the diagnosis of aflutter.  The various methods of treatment have been discussed with the patient and family. After consideration of risks, benefits and other options for treatment, the patient has consented to  Procedure(s): TRANSESOPHAGEAL ECHOCARDIOGRAM (TEE) (N/A) CARDIOVERSION (N/A) as a surgical intervention.  The patient's history has been reviewed, patient examined, no change in status, stable for surgery.  I have reviewed the patient's chart and labs.  Questions were answered to the patient's satisfaction.     Fransico Him

## 2021-01-07 NOTE — CV Procedure (Signed)
    PROCEDURE NOTE:  Procedure:  Transesophageal echocardiogram Operator:  Fransico Him, MD Indications:  Atrial fibrillation  Complications: None  During this procedure the patient is administered a total of Propofol 145 mg to achieve and maintain moderate conscious sedation and Lidocaine 40mg .  The patient's heart rate, blood pressure, and oxygen saturation are monitored continuously during the procedure by anesthesia.   Results: Dilated LV with severe LV dysfunction with EF 25-30% with global HK Normal RV size and function Mildly dilated RA Moderately dilated LA with mild spontaneous echo contrast and no thrombus.  Large LA appendage with no thrombus. Normal TV with mild TR Normal PV with mild PR Degenerative MV with mild thickening of leaflets and reduced leaflet excursion of the posterior MV leaflet and mild to moderate MR Normal trileaflet AV Normal interatrial septum with no evidence of shunt by colorflow dopper and agitated saline contrast injection Moderate Aortic atherosclerosis of the thoracic and ascending aorta.  Electrical Cardioversion Procedure Note NACHELLE NEGRETTE [id number] 28-Aug-1930  Procedure: Electrical Cardioversion Indications:  Atrial Fibrillation  Time Out: Verified patient identification, verified procedure,medications/allergies/relevent history reviewed, required imaging and test results available.  Performed  Procedure Details  The patient was NPO after midnight. Anesthesia was administered at the beside  by Dr.Hodierne with 150mg  of propofol.  Cardioversion was done with synchronized biphasic defibrillation with AP pads with 150watts.  The patient failed to convert  to normal sinus rhythm initially but converted to AV pacing a few minutes after shock. The patient tolerated the procedure well   IMPRESSION:  Successful cardioversion of atrial fibrillation    Hollyanne Schloesser 01/07/2021, 9:40 AM    The patient tolerated the procedure well and was  transferred back to their room in stable condition.  Signed: Fransico Him, MD [facility]

## 2021-01-07 NOTE — Plan of Care (Signed)
  Problem: Health Behavior/Discharge Planning: Goal: Ability to manage health-related needs will improve Outcome: Progressing   

## 2021-01-07 NOTE — Progress Notes (Signed)
Subjective:  Completed dialysis yesterday -net UF 1L. Tolerated well Some DOE, anxious overnight -on 2L [place] this am  For TEE today    Objective Vital signs in last 24 hours: Vitals:   01/06/21 1736 01/06/21 1808 01/06/21 2023 01/07/21 0559  BP: (!) (P) 118/56 (!) 107/50 (!) 105/45 115/60  Pulse: (P) 79 79 79 74  Resp: (P) 14 17 18 18   Temp: (P) 97.8 F (36.6 C) 98.2 F (36.8 C) 98.2 F (36.8 C) 97.8 F (36.6 C)  TempSrc: (P) Oral  Oral Oral  SpO2: (P) 97% 100% 99% 100%  Weight: (P) 50.2 kg     Height:        Physical Exam: General:Frail appearing, nad  Heart:RRR, noMRG Lungs:CTA nonlabored breathing Abdomen: soft, NTND Extremities:No LE edema  Dialysis Access:RUE AVF +bruit   OP dialysis Orders: [place]. TTS. 4hrs,3K,2.5Ca, EDW49 kg heparin 2k bolus Hectorol 1 mic Mircera 109 MCGq2 weeks,not yet given at outpatient center  right upper arm AV fistula   Problem/Plan  1.Acute Respiratory Failure:Volume overload and relatedA Flutter,volume issue resolved now. Volume removal with HD as able.  2. ESRD- HD TTS:On schedule. Next HD 3/26. Use 3K bath  3. Atrial Flutter Cardiology following.  For  TEE/DCCV today  4WorseningCM-  EF reduced to 25-30% compared to prior 40 to 45% multiple effusion with small pericardial effusion. Volume removal as tolerated.  5HTN/volume-BP stable on midodrine for BP supportforHD. Volume status improving UF with HD as able. Post HD wt 50.2kg  6. Anemiaof CKD-Hgb  8.2< 7.1 <7.5<7.1.Tsat 37, Iron 63, andferritin 1226.Iron series not indicated at this time.Last Aranesp50 mcg on  3/22  7Secondary Hyperparathyroidism-CorrectedCa ok. Phos low- no binders. Hectorol 73mcg twice weekly. 8. Nutrition- Low albumin. Regular diet with fluid restriction; protein supp.   Lynnda Child PA-C [facility] 01/07/2021,8:48 AM   Labs: Basic Metabolic Panel: Recent Labs   Lab 01/04/21 0307 01/05/21 0233 01/06/21 0741  NA 132* 132* 133*  K 3.9 3.9 3.8  CL 98 99 97*  CO2 25 28 28   GLUCOSE 73 90 87  BUN 35* 17 29*  CREATININE 2.43* 1.56* 2.25*  CALCIUM 8.2* 7.9* 8.5*  PHOS  --   --  2.6   Liver Function Tests: Recent Labs  Lab 01/06/21 0741  ALBUMIN 3.1*   No results for input(s): LIPASE, AMYLASE in the last 168 hours. No results for input(s): AMMONIA in the last 168 hours. CBC: Recent Labs  Lab 01/01/21 0934 01/02/21 0559 01/03/21 1102 01/04/21 0307 01/05/21 0233 01/06/21 0741  WBC 5.7 5.5 7.3 6.4 7.2 6.4  NEUTROABS 4.3 3.8 4.9  --   --   --   HGB 8.8* 7.1* 7.5* 7.1* 7.1* 8.2*  HCT 25.5* 20.7* 22.9* 21.7* 21.2* 25.1*  MCV 94.1 94.5 97.4 98.6 96.4 98.0  PLT 122* 112* 127* 136* 124* 162   Cardiac Enzymes: No results for input(s): CKTOTAL, CKMB, CKMBINDEX, TROPONINI in the last 168 hours. CBG: Recent Labs  Lab 01/06/21 0630 01/06/21 1142 01/06/21 1808 01/06/21 2146 01/07/21 0651  GLUCAP 90 115* 109* 146* 138*    Studies/Results: No results found. Medications:  . (feeding supplement) PROSource Plus  30 mL Oral TID BM  . acyclovir  400 mg Oral Daily  . allopurinol  50 mg Oral Daily  . apixaban  2.5 mg Oral BID  . Chlorhexidine Gluconate Cloth  6 each Topical Q0600  . cholecalciferol  2,000 Units Oral Daily  . darbepoetin (ARANESP) injection - DIALYSIS  50 mcg Intravenous Q Tue-HD  . dexamethasone  2 mg Oral Daily  . dorzolamide  1 drop Left Eye BID  . doxercalciferol  1 mcg Intravenous Once per day on Tue Thu  . feeding supplement (NEPRO CARB STEADY)  237 mL Oral BID BM  . fluorometholone  1 drop Right Eye Daily  . insulin aspart  0-6 Units Subcutaneous TID WC  . lactulose  10 g Oral TID  . latanoprost  1 drop Left Eye QHS  . metoCLOPramide  5 mg Oral TID AC  . midodrine  10 mg Oral Q T,Th,Sat-1800  . multivitamin  1 tablet Oral QHS  . polyethylene glycol  17 g Oral Daily  . pregabalin  50 mg Oral QHS  .  senna-docusate  2 tablet Oral BID

## 2021-01-07 NOTE — Anesthesia Procedure Notes (Signed)
Procedure Name: MAC Date/Time: 01/07/2021 10:24 AM Performed by: Janace Litten, CRNA Pre-anesthesia Checklist: Patient identified, Emergency Drugs available, Suction available and Patient being monitored Patient Re-evaluated:Patient Re-evaluated prior to induction Oxygen Delivery Method: Nasal cannula

## 2021-01-07 NOTE — Care Management Important Message (Signed)
Important Message  Patient Details  Name: [name] MRN: [id number] Date of Birth: May 26, 1930   Medicare Important Message Given:  Yes - Important Message mailed due to current National Emergency   Verbal consent obtained due to current National Emergency  Relationship to patient: Self Contact Name: Shuntavia Call Date: 01/07/21  Time: 1340 Phone: [phone] Outcome: Spoke with contact Important Message mailed to: Patient address on file    Delorse Lek 01/07/2021, 1:40 PM

## 2021-01-07 NOTE — Anesthesia Preprocedure Evaluation (Signed)
Anesthesia Evaluation  Patient identified by MRN, date of birth, ID band Patient awake    Reviewed: Allergy & Precautions, H&P , NPO status , Patient's Chart, lab work & pertinent test results  Airway Mallampati: II   Neck ROM: full    Dental   Pulmonary shortness of breath, sleep apnea ,    breath sounds clear to auscultation       Cardiovascular hypertension, + dysrhythmias Atrial Fibrillation  Rhythm:irregular Rate:Normal     Neuro/Psych    GI/Hepatic GERD  ,  Endo/Other  diabetes, Type 2  Renal/GU Renal InsufficiencyRenal disease     Musculoskeletal  (+) Arthritis ,   Abdominal   Peds  Hematology  (+) Blood dyscrasia, anemia , Multiple myeloma   Anesthesia Other Findings   Reproductive/Obstetrics                             Anesthesia Physical Anesthesia Plan  ASA: III  Anesthesia Plan: General   Post-op Pain Management:    Induction: Intravenous  PONV Risk Score and Plan: 3 and Propofol infusion and Treatment may vary due to age or medical condition  Airway Management Planned: Nasal Cannula  Additional Equipment:   Intra-op Plan:   Post-operative Plan:   Informed Consent: I have reviewed the patients History and Physical, chart, labs and discussed the procedure including the risks, benefits and alternatives for the proposed anesthesia with the patient or authorized representative who has indicated his/her understanding and acceptance.     Dental advisory given  Plan Discussed with: CRNA, Anesthesiologist and Surgeon  Anesthesia Plan Comments:         Anesthesia Quick Evaluation

## 2021-01-07 NOTE — Anesthesia Postprocedure Evaluation (Signed)
Anesthesia Post Note  Patient: [name]  Procedure(s) Performed: TRANSESOPHAGEAL ECHOCARDIOGRAM (TEE) (N/A ) CARDIOVERSION (N/A ) BUBBLE STUDY     Patient location during evaluation: Endoscopy Anesthesia Type: General Level of consciousness: awake and alert Pain management: pain level controlled Vital Signs Assessment: post-procedure vital signs reviewed and stable Respiratory status: spontaneous breathing, nonlabored ventilation, respiratory function stable and patient connected to nasal cannula oxygen Cardiovascular status: blood pressure returned to baseline and stable Postop Assessment: no apparent nausea or vomiting Anesthetic complications: no   No complications documented.  Last Vitals:  Vitals:   01/07/21 1123 01/07/21 1147  BP: (!) 132/54 (!) 150/72  Pulse: 72 70  Resp: 16 17  Temp:  36.8 C  SpO2: 100% 100%    Last Pain:  Vitals:   01/07/21 1123  TempSrc:   PainSc: 0-No pain                 Ayinde Swim S

## 2021-01-07 NOTE — Progress Notes (Signed)
  Echocardiogram Echocardiogram Transesophageal has been performed.  Johny Chess 01/07/2021, 11:02 AM

## 2021-01-07 NOTE — TOC Initial Note (Signed)
Transition of Care [facility]) - Initial/Assessment Note    Patient Details  Name: [name] MRN: [id number] Date of Birth: [birth year]  [facility] CM/SW Contact:    Pollie Friar, RN Phone Number: 01/07/2021, 2:02 PM  Clinical Narrative:                 Pt lives at home with her spouse and son that provides her needed care.  Pt denies issues with home transportation and meds.  Orders for [facility] services. Pt without a preference. HH arranged through [facility]. Cory with [facility] accepted the referral. TOC following for further d/c needs.   Expected Discharge Plan: [place] Barriers to Discharge: Continued Medical Work up   Patient Goals and CMS Choice   CMS Medicare.gov Compare Post Acute Care list provided to:: Patient Choice offered to / list presented to : Patient  Expected Discharge Plan and Services Expected Discharge Plan: [place]   Discharge Planning Services: CM Consult Post Acute Care Choice: [place] arrangements for the past 2 months: Single Family Home                           HH Arranged: PT [place]: [place] Date [place]: 01/07/21   Representative spoke with at [place]: Tommi Rumps  Prior Living Arrangements/Services Living arrangements for the past 2 months: [place] Lives with:: [place] Patient language and need for interpreter reviewed:: Yes Do you feel safe going back to the place where you live?: Yes      Need for Family Participation in Patient Care: Yes (Comment) Care giver support system in place?: Yes (comment) Current home services: DME (walker/ cane/ wheelchair/ shower seat) Criminal Activity/Legal Involvement Pertinent to Current Situation/Hospitalization: No - Comment as needed  Activities of Daily Living Home Assistive Devices/Equipment: Eyeglasses,Wheelchair,Walker (specify type) ADL Screening (condition at time of  admission) Patient's cognitive ability adequate to safely complete daily activities?: Yes Is the patient deaf or have difficulty hearing?: No Does the patient have difficulty seeing, even when wearing glasses/contacts?: No Does the patient have difficulty concentrating, remembering, or making decisions?: No Patient able to express need for assistance with ADLs?: Yes Does the patient have difficulty dressing or bathing?: Yes Independently performs ADLs?: No Communication: Independent Dressing (OT): Needs assistance Is this a change from baseline?: Pre-admission baseline Grooming: Independent Feeding: Dependent Is this a change from baseline?: Pre-admission baseline Bathing: Needs assistance Is this a change from baseline?: Pre-admission baseline Toileting: Needs assistance Is this a change from baseline?: Pre-admission baseline In/Out Bed: Needs assistance Is this a change from baseline?: Pre-admission baseline Walks in Home: Independent with device (comment) Does the patient have difficulty walking or climbing stairs?: Yes Weakness of Legs: Both Weakness of Arms/Hands: None  Permission Sought/Granted                  Emotional Assessment Appearance:: Appears stated age Attitude/Demeanor/Rapport: Engaged Affect (typically observed): Accepting Orientation: : Oriented to Self,Oriented to Place,Oriented to  Time,Oriented to Situation   Psych Involvement: No (comment)  Admission diagnosis:  SOB (shortness of breath) [R06.02] Hypoxia [R09.02] Fluid overload [E87.70] Hypervolemia, unspecified hypervolemia type [E87.70] Atrial flutter [facility]) [I48.92] Patient Active Problem List   Diagnosis Date Noted  . Protein-calorie malnutrition, severe 12/31/2020  . Atrial flutter ([place]) 12/31/2020  . Fluid overload 12/30/2020  . Weight loss, non-intentional 12/10/2020  . Pain due to  onychomycosis of toenails of both feet 12/08/2020  . Type 2 diabetes mellitus with vascular disease  ([place]) 12/08/2020  . Persistent atrial fibrillation ([place]) 11/08/2020  . Gastritis 10/27/2020  . Delayed gastric emptying 10/27/2020  . Atypical atrial flutter ([place]) 10/25/2020  . Secondary hypercoagulable state ([place]) 10/25/2020  . Pulmonary edema 09/10/2020  . Pleural effusion, bilateral 09/10/2020  . Acute respiratory failure with hypoxia ([place]) 04/02/2020  . Elevated troponin 04/02/2020  . Hypotension 03/23/2020  . Left hip pain 01/12/2020  . Vitamin B12 deficiency 10/20/2019  . Deficiency anemia 09/26/2019  . Anaphylactic shock, unspecified, sequela 07/25/2019  . Complication of vascular dialysis catheter 07/21/2019  . Peripheral neuropathy due to chemotherapy ([place]) 07/04/2019  . Encounter for removal of sutures 07/03/2019  . Goals of care, counseling/discussion 05/30/2019  . Pressure injury of skin 05/26/2019  . Yeast infection of the skin 04/04/2019  . Cancer associated pain 03/26/2019  . Moderate protein-calorie malnutrition ([place]) 03/24/2019  . Nausea in adult 03/14/2019  . ESRD (end stage renal disease) on dialysis ([place]) 03/14/2019  . Other constipation 03/14/2019  . Coagulation defect, unspecified ([place]) 03/13/2019  . Pancytopenia, acquired ([place]) 03/12/2019  . Anemia in chronic kidney disease 03/11/2019  . Bradycardia, unspecified 03/11/2019  . Iron deficiency anemia, unspecified 03/11/2019  . Secondary hyperparathyroidism of renal origin ([place]) 03/11/2019  . Multiple myeloma not having achieved remission ([place]) 03/03/2019  . Hypokalemia 02/25/2019  . Anemia   . Shingles 03/09/2015  . Sick sinus syndrome ([place]) 02/03/2015  . Shortness of breath 02/01/2014  . Fatigue 02/01/2014  . BENIGN PAROXYSMAL POSITIONAL VERTIGO 04/06/2010  . BRADYCARDIA 01/27/2009  . Diabetes mellitus ([place]) 02/24/2008  . Obstructive sleep apnea 02/24/2008  . 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 ALLERGIC RHINITIS 02/24/2008  . CAROTID BRUIT 02/24/2008   PCP:  Lucianne Lei, MD Pharmacy:    [id number] [place], [place] [address] [address]Phone: [phone]: [phone] [facility] [place] - [id number]Phone: [phone]: [phone]    Social Determinants of Health (SDOH) Interventions    Readmission Risk Interventions Readmission Risk Prevention Plan 04/04/2020 03/03/2019  Transportation Screening Complete Complete  PCP or Specialist Appt within 3-5 Days - Not Complete  Not Complete comments - not yet ready for d/c  HRI or H[place]- Not Complete  HRI or Home Care Consult comments - no needs at this time  Social Work Consult for [place]/Counseling - Not Complete  SW consult not completed comments - no needs at this time  Palliative Care Screening - Not Applicable  Medication Review ([place] Complete Complete  PCP or Specialist appointment within 3-5 days of discharge (No Data) -  [place]or Home Care Consult Complete -  SW Recovery Care/Counseling Consult Complete -  Palliative Care Screening Not Applicable -  [place] Applicable -  Some recent data might be hidden

## 2021-01-07 NOTE — Transfer of Care (Signed)
Immediate Anesthesia Transfer of Care Note  Patient: [name]  Procedure(s) Performed: TRANSESOPHAGEAL ECHOCARDIOGRAM (TEE) (N/A ) CARDIOVERSION (N/A ) BUBBLE STUDY  Patient Location: PACU and Endoscopy Unit  Anesthesia Type:General  Level of Consciousness: drowsy, patient cooperative and responds to stimulation  Airway & Oxygen Therapy: Patient Spontanous Breathing and Patient connected to nasal cannula oxygen  Post-op Assessment: Report given to RN and Post -op Vital signs reviewed and stable  Post vital signs: Reviewed and stable  Last Vitals:  Vitals Value Taken Time  BP    Temp    Pulse    Resp    SpO2      Last Pain:  Vitals:   01/07/21 1005  TempSrc: Temporal  PainSc:       Patients Stated Pain Goal: 0 (32/12/24 8250)  Complications: No complications documented.

## 2021-01-08 ENCOUNTER — Encounter (HOSPITAL_COMMUNITY): Payer: Self-pay | Admitting: Cardiology

## 2021-01-08 ENCOUNTER — Telehealth: Payer: Self-pay | Admitting: Nephrology

## 2021-01-08 NOTE — Telephone Encounter (Signed)
Transition of care contact from inpatient facility  Date of discharge: 01/07/21 Date of contact: 01/08/21 Method: Phone Spoke to: Patient  Patient contacted to discuss transition of care from recent inpatient hospitalization. Patient was admitted to [facility] from 3/17 -01/07/21 with discharge diagnosis of Acute hypoxic respiratory failure 2/2 volume overload, persistent atrial flutter. Still has some DOE.   Medications were reviewed.  Patient will follow up with his/her outpatient HD unit on: She went to her scheduled dialysis today. Next dialysis will be Tuesday.

## 2021-01-09 NOTE — Discharge Summary (Signed)
[facility] Discharge Summary   Patient: [name]  PCP: Lucianne Lei, MD  Date of admission: 12/30/2020   Date of discharge: 01/07/2021     Discharge Diagnoses:  Principal Problem:   Acute respiratory failure with hypoxia ([place]) Active Problems:   Multiple myeloma not having achieved remission ([place])   ESRD (end stage renal disease) on dialysis ([place])   Atypical atrial flutter ([facility])   Fluid overload   Protein-calorie malnutrition, severe   Atrial flutter ([place])   Admitted From: home Disposition:  Home with home health  Recommendations for Outpatient Follow-up:  1. PCP: Follow-up with PCP, cardiology.  Continue HD per schedule 2. Follow up LABS/TEST: None 3. New Meds: Nutrition supplements 4. Changed meds: None 5. Stopped meds: None   Follow-up Information    Fenton, Clint R, PA Follow up on 01/12/2021.   Specialty: Cardiology Why: at 130pm Contact information: [place] [address] [address], [facility] Follow up.   Specialty: Home Health Services Why: The home health agency will contact you for the first home visit. Contact information: [place] [address] [phone]        Lucianne Lei, MD. Schedule an appointment as soon as possible for a visit in 1 week(s).   Specialty: Family Medicine Contact information: [address] [place] [address] [phone]              Discharge Instructions    Diet - low sodium heart healthy   Complete by: As directed    Increase activity slowly   Complete by: As directed       Diet recommendation: Cardiac diet  Activity: The patient is advised to gradually reintroduce usual activities, as tolerated  Discharge Condition: stable  Code Status: Full code   History of present illness: As per the H and P dictated on admission, "[name] is a 87 y.o. female with medical history significant of DM2, ESRD, HTN,  MM, SSS bradycardia s/p PPM.  Pt with increased SOB, fatigue, recently.  This believed to be possibly related to A.Flutter.  Dr. Rayann Heman saw pt in office yesterday, scheduled cardioversion for Wed next week.  Today pt with severe worsening of SOB.  Did have partial session of dialysis.  In to ED for severity of symptoms.  No CP, no fevers, no chills."  Hospital Course:  Summary of her active problems in the hospital is as following.   1.Acute respiratory failure with hypoxia, POA Acute on chronic combined diastolic and systolic CHF Volume overload Presents with shortness of breath. Required 4 L of oxygen on admission. Cardiology and nephrology were consulted. After volume removal with HD oxygenation currently improved.  2.Persistent atypical atrial flutter Appreciate cardiology consultation. On Eliquis. Plan for TEE cardioversion were delayed due to poor peripheral IV access.  Underwent TEE cardioversion on 3/25. Echocardiogram showed EF dropping from 40 to 45% to 25 to 30%. Chest x-ray showed large pleural effusion. Outpatient work-up by cardiology for cardiomyopathy.  3.ESRD on HD TTS Anemia of chronic kidney disease Hyperparathyroidism Appreciate nephrology assistance and volume removal. Management per nephrology H&H relatively on a lower side but stable. No active bleeding so far.  4.Hypotension associated with HD Currently on midodrine for BP support. Monitor.  5.Type 2 diabetes mellitus, uncontrolled with hyperglycemia with renal complication Currently on sliding scale insulin. Monitor.  6.History of multiple myeloma On Decadron. Which is currently continued.  7.Severe protein calorie malnutrition. Dietitian following. Continue protein supplement. Body mass index is 18.42 kg/m (pended).  Nutrition Problem: Severe Malnutrition Etiology: chronic illness (ESRD on HD) Nutrition Interventions: Interventions: Nepro shake,MVI,Liberalize  Diet  Home health was arranged on discharge. On the day of the discharge the patient's vitals were stable, and no other acute medical condition were reported by patient. The patient was felt safe to be discharge at Home with Home health.  Consultants: Nephrology, Cardiology  Procedures: TEE cardioversion  DISCHARGE MEDICATION: Allergies as of 01/07/2021      Reactions   Aspirin Other (See Comments)   REACTION: STOMACH ISSUES WITH DOSE HIGHER THAN 81 MG  Other reaction(s): GI Upset (intolerance), Other (See Comments) REACTION: STOMACH ISSUES WITH DOSE HIGHER THAN 81 MG  REACTION: STOMACH ISSUES WITH DOSE HIGHER THAN 81 MG    Hydrocodone-acetaminophen Nausea And Vomiting   Morphine Nausea And Vomiting   Penicillins Rash   Patient took injection and tablets, had a reaction. She has taken amoxicillin with no reaction Has patient had a PCN reaction causing immediate rash, facial/tongue/throat swelling, SOB or lightheadedness with hypotension: Yes Has patient had a PCN reaction causing severe rash involving mucus membranes or skin necrosis: Yes Has patient had a PCN reaction that required hospitalization No Has patient had a PCN reaction occurring within the last 10 years: No If all of the above answers are "NO", then m Patient took injection and tablets, had a reaction. She has taken amoxicillin with no reaction Patient took injection and tablets, had a reaction. She has taken amoxicillin with no reaction Has patient had a PCN reaction causing immediate rash, facial/tongue/throat swelling, SOB or lightheadedness with hypotension: Yes Has patient had a PCN reaction causing severe rash involving mucus membranes or skin necrosis: Yes Has patient had a PCN reaction that required hospitalization No Has patient had a PCN reaction occurring within the last 10 years: No If all of the above answers are "NO", then m   Betaine Nausea And Vomiting   Dacarbazine Nausea And Vomiting   Ketorolac  Tromethamine Itching   Brimonidine Itching   Diltiazem Hcl Rash   Hydrocodone-acetaminophen Nausea And Vomiting   Neurontin [gabapentin] Nausea And Vomiting   Sulfacetamide Sodium Rash      Medication List    TAKE these medications   (feeding supplement) PROSource Plus liquid Take 30 mLs by mouth 3 (three) times daily between meals.   feeding supplement (NEPRO CARB STEADY) Liqd Take 237 mLs by mouth 2 (two) times daily between meals.   acetaminophen 500 MG tablet Commonly known as: TYLENOL Take 1,000 mg by mouth 2 (two) times daily as needed for mild pain or moderate pain.   acyclovir 400 MG tablet Commonly known as: ZOVIRAX Take 1 tablet (400 mg total) by mouth daily.   allopurinol 100 MG tablet Commonly known as: ZYLOPRIM Take 0.5 tablets (50 mg total) by mouth daily.   apixaban 2.5 MG Tabs tablet Commonly known as: Eliquis Take 1 tablet (2.5 mg total) by mouth 2 (two) times daily.   cholecalciferol 25 MCG (1000 UNIT) tablet Commonly known as: VITAMIN D3 Take 2,000 Units by mouth daily.   dexamethasone 2 MG tablet Commonly known as: DECADRON Take 1 tablet (2 mg total) by mouth daily.   dorzolamide 2 % ophthalmic solution Commonly known as: TRUSOPT Place 1 drop into the left eye 2 (two) times daily.   fluorometholone 0.1 % ophthalmic suspension Commonly known as: FML Place 1 drop into the right eye daily.  glimepiride 1 MG tablet Commonly known as: AMARYL Take 0.5 mg by mouth daily.   lactulose 10 GM/15ML solution Commonly known as: CHRONULAC Take 15 mLs by mouth 3 (three) times daily.   lidocaine-prilocaine cream Commonly known as: EMLA Apply 1 application topically as needed [facility]).   Lumigan 0.01 % Soln Generic drug: bimatoprost Place 1 drop into the left eye at bedtime.   Lyrica 50 MG capsule Generic drug: pregabalin Take 1 capsule (50 mg total) by mouth at bedtime.   metoCLOPramide 5 MG tablet Commonly known as: REGLAN Take 1 tablet (5 mg  total) by mouth 3 (three) times daily before meals.   midodrine 10 MG tablet Commonly known as: PROAMATINE Take 10 mg by mouth as directed. Low bp, and before dialysis   MIRCERA IJ Mircera   multivitamin tablet Take 1 tablet by mouth daily.   ondansetron 4 MG tablet Commonly known as: ZOFRAN Take 1 tablet (4 mg total) by mouth every 8 (eight) hours as needed for nausea.   oxyCODONE 5 MG immediate release tablet Commonly known as: Oxy IR/ROXICODONE Take 1 tablet (5 mg total) by mouth every 4 (four) hours as needed for severe pain.   pantoprazole 40 MG tablet Commonly known as: PROTONIX Take 1 tablet (40 mg total) by mouth daily as needed (acid reflux/indigestion.).   polyethylene glycol 17 g packet Commonly known as: MIRALAX / GLYCOLAX Take 17 g by mouth daily.   senna-docusate 8.6-50 MG tablet Commonly known as: Senokot-S Take 2 tablets by mouth 2 (two) times daily.   sodium chloride 0.65 % Soln nasal spray Commonly known as: OCEAN Place 1 spray into both nostrils 4 (four) times daily as needed for congestion.       Discharge Exam: Filed Weights   01/05/21 2056 01/06/21 1356 01/06/21 1736  Weight: 51 kg 51.6 kg (P) 50.2 kg   Vitals:   01/07/21 1123 01/07/21 1147  BP: (!) 132/54 (!) 150/72  Pulse: 72 70  Resp: 16 17  Temp:  98.2 F (36.8 C)  SpO2: 100% 100%   General: Appear in mild distress, no Rash; Oral Mucosa Clear, moist. no Abnormal Neck Mass Or lumps, Conjunctiva normal  Cardiovascular: S1 and S2 Present, no Murmur, Respiratory: good respiratory effort, Bilateral Air entry present and CTA, no Crackles, no wheezes Abdomen: Bowel Sound present, Soft and no tenderness Extremities: no Pedal edema Neurology: alert and oriented to time, place, and person affect appropriate. no new focal deficit Gait not checked due to patient safety concerns  The results of significant diagnostics from this hospitalization (including imaging, microbiology, ancillary and  laboratory) are listed below for reference.    Significant Diagnostic Studies: DG CHEST PORT 1 VIEW  Result Date: 01/03/2021 CLINICAL DATA:  Shortness of breath EXAM: PORTABLE CHEST 1 VIEW COMPARISON:  December 30, 2020. FINDINGS: There is ill-defined airspace opacity in the left base with small left pleural effusion. The right lung is clear. No appreciable interstitial edema. There is cardiomegaly with pulmonary vascularity within normal limits. Pacemaker leads attached to right atrium right ventricle. There is aortic atherosclerosis. Bones are osteoporotic. There is arthropathy in each shoulder. Skin fold noted on the right. IMPRESSION: Ill-defined opacity concerning for pneumonia left base with small left pleural effusion. Right lung clear. Cardiomegaly with pacemaker leads attached to right atrium and right ventricle, stable. Aortic Atherosclerosis (ICD10-I70.0). Electronically Signed   By: Lowella Grip III M.D.   On: 01/03/2021 08:03   DG Chest Portable 1 View  Result Date: 12/30/2020 CLINICAL  DATA:  Shortness of breath. EXAM: PORTABLE CHEST 1 VIEW COMPARISON:  September 11, 2020. FINDINGS: Stable cardiomediastinal silhouette. Left-sided pacemaker is unchanged in position. No pneumothorax is noted. Mild bibasilar atelectasis is noted with probable small pleural effusions. Bony thorax is unremarkable. IMPRESSION: Mild bibasilar subsegmental atelectasis with probable small pleural effusions. Aortic Atherosclerosis (ICD10-I70.0). Electronically Signed   By: Marijo Conception M.D.   On: 12/30/2020 15:56   ECHOCARDIOGRAM COMPLETE  Result Date: 01/02/2021    ECHOCARDIOGRAM REPORT   Patient Name:   [name] Date of Exam: 01/01/2021 Medical Rec #:  [id number]        Height:       65.0 in Accession #:    [id number]       Weight:       103.6 lb Date of Birth:  Nov 15, 1929       BSA:          1.496 m Patient Age:    73 years         BP:           104/55 mmHg Patient Gender: F                HR:            78 bpm. Exam Location:  Inpatient Procedure: 2D Echo, Cardiac Doppler and Color Doppler Indications:     I50.9* Heart failure (unspecified)  History:         Patient has prior history of Echocardiogram examinations, most                  recent 04/02/2020. Pacemaker; Arrythmias:Atrial Flutter. H/O                  Breast cancer.  Sonographer:     [name] Referring Phys:  [id number] [facility] Gasper Sells Diagnosing Phys: Ena Dawley MD IMPRESSIONS  1. When compared to the prior study from 04/02/2021 LVEF is significantly worse with diffuse hypokinesis and severe dyssynchrony secondary to RV pacing. LVEF 25-30%, previously 40-45%.  2. Left ventricular ejection fraction, by estimation, is 25 to 30%. The left ventricle has severely decreased function. The left ventricle demonstrates global hypokinesis. There is moderate concentric left ventricular hypertrophy. Left ventricular diastolic parameters are consistent with Grade II diastolic dysfunction (pseudonormalization). Elevated left atrial pressure.  3. Right ventricular systolic function is normal. The right ventricular size is normal. There is severely elevated pulmonary artery systolic pressure. The estimated right ventricular systolic pressure is 19.6 mmHg.  4. Left atrial size was moderately dilated.  5. Right atrial size was mildly dilated.  6. A small pericardial effusion is present. The pericardial effusion is circumferential. Large pleural effusion in the left lateral region.  7. The mitral valve is normal in structure. Moderate mitral valve regurgitation. No evidence of mitral stenosis.  8. Tricuspid valve regurgitation is severe.  9. The aortic valve is normal in structure. Aortic valve regurgitation is not visualized. Mild to moderate aortic valve sclerosis/calcification is present, without any evidence of aortic stenosis. Aortic valve mean gradient measures 8.0 mmHg. 10. Mild pulmonic stenosis. 11. The inferior vena cava is dilated in size with <50%  respiratory variability, suggesting right atrial pressure of 15 mmHg. FINDINGS  Left Ventricle: Left ventricular ejection fraction, by estimation, is 25 to 30%. The left ventricle has severely decreased function. The left ventricle demonstrates global hypokinesis. The left ventricular internal cavity size was normal in size. There is moderate concentric left ventricular hypertrophy. Abnormal (paradoxical)  septal motion, consistent with RV pacemaker. Left ventricular diastolic parameters are consistent with Grade II diastolic dysfunction (pseudonormalization). Elevated left atrial pressure. Right Ventricle: The right ventricular size is normal. No increase in right ventricular wall thickness. Right ventricular systolic function is normal. There is severely elevated pulmonary artery systolic pressure. The tricuspid regurgitant velocity is 3.54 m/s, and with an assumed right atrial pressure of 15 mmHg, the estimated right ventricular systolic pressure is 20.8 mmHg. Left Atrium: Left atrial size was moderately dilated. Right Atrium: Right atrial size was mildly dilated. Pericardium: A small pericardial effusion is present. The pericardial effusion is circumferential. Mitral Valve: The mitral valve is normal in structure. Moderate mitral valve regurgitation. No evidence of mitral valve stenosis. Tricuspid Valve: The tricuspid valve is normal in structure. Tricuspid valve regurgitation is severe. No evidence of tricuspid stenosis. Aortic Valve: The aortic valve is normal in structure. Aortic valve regurgitation is not visualized. Mild to moderate aortic valve sclerosis/calcification is present, without any evidence of aortic stenosis. Aortic valve mean gradient measures 8.0 mmHg. Aortic valve peak gradient measures 14.6 mmHg. Aortic valve area, by VTI measures 1.24 cm. Pulmonic Valve: The pulmonic valve was normal in structure. Pulmonic valve regurgitation is not visualized. Mild pulmonic stenosis. Aorta: The aortic root  is normal in size and structure. Venous: The inferior vena cava is dilated in size with less than 50% respiratory variability, suggesting right atrial pressure of 15 mmHg. IAS/Shunts: No atrial level shunt detected by color flow Doppler. Additional Comments: A device lead is visualized in the right ventricle and right atrium. There is a large pleural effusion in the left lateral region.  LEFT VENTRICLE PLAX 2D LVIDd:         4.60 cm      Diastology LVIDs:         4.00 cm      LV e' lateral: 7.07 cm/s LV PW:         1.40 cm LV IVS:        1.40 cm LVOT diam:     2.00 cm LV SV:         43 LV SV Index:   29 LVOT Area:     3.14 cm  LV Volumes (MOD) LV vol d, MOD A2C: 104.0 ml LV vol d, MOD A4C: 148.0 ml LV vol s, MOD A2C: 68.6 ml LV vol s, MOD A4C: 135.0 ml LV SV MOD A2C:     35.4 ml LV SV MOD A4C:     148.0 ml LV SV MOD BP:      24.5 ml RIGHT VENTRICLE            IVC RV Basal diam:  3.70 cm    IVC diam: 1.90 cm RV S prime:     8.81 cm/s TAPSE (M-mode): 1.4 cm LEFT ATRIUM              Index       RIGHT ATRIUM           Index LA diam:        3.90 cm  2.61 cm/m  RA Area:     25.30 cm LA Vol (A2C):   111.0 ml 74.21 ml/m RA Volume:   76.70 ml  51.28 ml/m LA Vol (A4C):   78.5 ml  52.48 ml/m LA Biplane Vol: 96.2 ml  64.31 ml/m  AORTIC VALVE AV Area (Vmax):    1.29 cm AV Area (Vmean):   1.27 cm AV Area (VTI):  1.24 cm AV Vmax:           191.00 cm/s AV Vmean:          128.000 cm/s AV VTI:            0.347 m AV Peak Grad:      14.6 mmHg AV Mean Grad:      8.0 mmHg LVOT Vmax:         78.30 cm/s LVOT Vmean:        51.800 cm/s LVOT VTI:          0.137 m LVOT/AV VTI ratio: 0.39  AORTA Ao Root diam: 3.30 cm Ao Asc diam:  2.80 cm TRICUSPID VALVE TR Peak grad:   50.1 mmHg TR Vmax:        354.00 cm/s  SHUNTS Systemic VTI:  0.14 m Systemic Diam: 2.00 cm Ena Dawley MD Electronically signed by Ena Dawley MD Signature Date/Time: 01/02/2021/12:21:23 PM    Final (Updated)    ECHO TEE  Result Date: 01/07/2021     TRANSESOPHOGEAL ECHO REPORT   Patient Name:   [name] Date of Exam: 01/07/2021 Medical Rec #:  [id number]        Height:       65.0 in Accession #:    [id number]       Weight:       113.8 lb Date of Birth:  [birth year]       BSA:          1.556 m Patient Age:    89 years         BP:           135/46 mmHg Patient Gender: F                HR:           71 bpm. Exam Location:  Inpatient Procedure: Transesophageal Echo, Color Doppler and Limited Color Doppler Indications:     atrial fibrillation  History:         Patient has prior history of Echocardiogram examinations, most                  recent 01/01/2021. End stage renal disease, Arrythmias:Atrial                  Fibrillation; Risk Factors:Diabetes and Sleep Apnea.  Sonographer:     Johny Chess Referring Phys:  [phone] TRACI R TURNER Diagnosing Phys: Fransico Him MD PROCEDURE: After discussion of the risks and benefits of a TEE, an informed consent was obtained from the patient. The transesophogeal probe was passed without difficulty through the esophogus of the patient. Sedation performed by different physician. The patient was monitored while under deep sedation. Anesthestetic sedation was provided intravenously by Anesthesiology: 48m of Propofol, 481mof Lidocaine. The patient's vital signs; including heart rate, blood pressure, and oxygen saturation; remained stable throughout the procedure. The patient developed no complications during the procedure. A direct current cardioversion was performed. IMPRESSIONS  1. Left ventricular ejection fraction, by estimation, is 25 to 30%. The left ventricle has severely decreased function. The left ventricle demonstrates global hypokinesis. The left ventricular internal cavity size was moderately dilated.  2. Right ventricular systolic function is normal. The right ventricular size is normal.  3. Left atrial size was moderately dilated. No left atrial/left atrial appendage thrombus was detected.  4. Right atrial  size was mildly dilated.  5. The mitral valve is degenerative in appearance. There is mild thickening  of the mitral valve leaflet(s). Reduced leaflet mobility of the posterior mitral valve leaflet of the mitral valve leaflets. No evidence of mitral valve regurgitation. No evidence of mitral valve stenosis.  6. The aortic valve is normal in structure. Aortic valve regurgitation is not visualized. No aortic stenosis is present.  7. The inferior vena cava is normal in size with greater than 50% respiratory variability, suggesting right atrial pressure of 3 mmHg.  8. Agitated saline contrast bubble study was negative, with no evidence of any interatrial shunt.  9. There is mild (Grade II) layered plaque involving the ascending aorta and descending aorta. Conclusion(s)/Recommendation(s): Normal biventricular function without evidence of hemodynamically significant valvular heart disease. No LA/LAA thrombus identified. Successful cardioversion performed with restoration of normal sinus rhythm. FINDINGS  Left Ventricle: Left ventricular ejection fraction, by estimation, is 25 to 30%. The left ventricle has severely decreased function. The left ventricle demonstrates global hypokinesis. The left ventricular internal cavity size was moderately dilated. There is no left ventricular hypertrophy. Right Ventricle: The right ventricular size is normal. No increase in right ventricular wall thickness. Right ventricular systolic function is normal. Left Atrium: Left atrial size was moderately dilated. Spontaneous echo contrast was present. No left atrial/left atrial appendage thrombus was detected. Right Atrium: Right atrial size was mildly dilated. Pericardium: There is no evidence of pericardial effusion. Mitral Valve: The mitral valve is degenerative in appearance. There is mild thickening of the mitral valve leaflet(s). Reduced leaflet mobility of the posterior mitral valve leaflet of the mitral valve leaflets. No evidence of  mitral valve regurgitation.  No evidence of mitral valve stenosis. Tricuspid Valve: The tricuspid valve is normal in structure. Tricuspid valve regurgitation is mild . No evidence of tricuspid stenosis. Aortic Valve: The aortic valve is normal in structure. Aortic valve regurgitation is not visualized. No aortic stenosis is present. Pulmonic Valve: The pulmonic valve was normal in structure. Pulmonic valve regurgitation is mild. No evidence of pulmonic stenosis. Aorta: The aortic root is normal in size and structure. There is mild (Grade II) layered plaque involving the ascending aorta and descending aorta. Venous: The inferior vena cava is normal in size with greater than 50% respiratory variability, suggesting right atrial pressure of 3 mmHg. IAS/Shunts: No atrial level shunt detected by color flow Doppler. Agitated saline contrast was given intravenously to evaluate for intracardiac shunting. Agitated saline contrast bubble study was negative, with no evidence of any interatrial shunt. There  is no evidence of a patent foramen ovale.  MR Peak grad:    79.2 mmHg MR Mean grad:    43.0 mmHg MR Vmax:         445.00 cm/s MR Vmean:        298.0 cm/s MR PISA:         1.57 cm MR PISA Eff ROA: 14 mm MR PISA Radius:  0.50 cm Fransico Him MD Electronically signed by Fransico Him MD Signature Date/Time: 01/07/2021/1:43:55 PM    Final    CUP PACEART INCLINIC DEVICE CHECK  Result Date: 12/29/2020 Patient seen in device clinic today to attempt ANIPS. Was performed by Dr. Rayann Heman, patient initially was AFL and converted to AF. Would like patient to send manual transmission 12/30/20 to assess presenting. Patient will be scheduled cardioversion.Lavenia Atlas, BSN, RN   Microbiology: Recent Results (from the past 240 hour(s))  Resp Panel by RT-PCR (Flu A&B, Covid) Nasopharyngeal Swab     Status: None   Collection Time: 12/30/20  3:28 PM   Specimen: Nasopharyngeal Swab;  Nasopharyngeal(NP) swabs in vial transport medium   Result Value Ref Range Status   SARS Coronavirus 2 by RT PCR NEGATIVE NEGATIVE Final    Comment: (NOTE) SARS-CoV-2 target nucleic acids are NOT DETECTED.  The SARS-CoV-2 RNA is generally detectable in upper respiratory specimens during the acute phase of infection. The lowest concentration of SARS-CoV-2 viral copies this assay can detect is 138 copies/mL. A negative result does not preclude SARS-Cov-2 infection and should not be used as the sole basis for treatment or other patient management decisions. A negative result may occur with  improper specimen collection/handling, submission of specimen other than nasopharyngeal swab, presence of viral mutation(s) within the areas targeted by this assay, and inadequate number of viral copies(<138 copies/mL). A negative result must be combined with clinical observations, patient history, and epidemiological information. The expected result is Negative.  Fact Sheet for Patients:  EntrepreneurPulse.com.au  Fact Sheet for Healthcare Providers:  IncredibleEmployment.be  This test is no t yet approved or cleared by the [place] FDA and  has been authorized for detection and/or diagnosis of SARS-CoV-2 by FDA under an Emergency Use Authorization (EUA). This EUA will remain  in effect (meaning this test can be used) for the duration of the COVID-19 declaration under Section 564(b)(1) of the Act, 21 U.S.C.section 360bbb-3(b)(1), unless the authorization is terminated  or revoked sooner.       Influenza A by PCR NEGATIVE NEGATIVE Final   Influenza B by PCR NEGATIVE NEGATIVE Final    Comment: (NOTE) The Xpert Xpress SARS-CoV-2/FLU/RSV plus assay is intended as an aid in the diagnosis of influenza from Nasopharyngeal swab specimens and should not be used as a sole basis for treatment. Nasal washings and aspirates are unacceptable for Xpert Xpress SARS-CoV-2/FLU/RSV testing.  Fact Sheet for  Patients: EntrepreneurPulse.com.au  Fact Sheet for Healthcare Providers: IncredibleEmployment.be  This test is not yet approved or cleared by the [place] FDA and has been authorized for detection and/or diagnosis of SARS-CoV-2 by FDA under an Emergency Use Authorization (EUA). This EUA will remain in effect (meaning this test can be used) for the duration of the COVID-19 declaration under Section 564(b)(1) of the Act, 21 U.S.C. section 360bbb-3(b)(1), unless the authorization is terminated or revoked.  Performed at [facility], [place] [address], [place], [place] [address]   MRSA PCR Screening     Status: None   Collection Time: 12/31/20  6:53 AM   Specimen: Nasopharyngeal  Result Value Ref Range Status   MRSA by PCR NEGATIVE NEGATIVE Final    Comment:        The GeneXpert MRSA Assay (FDA approved for NASAL specimens only), is one component of a comprehensive MRSA colonization surveillance program. It is not intended to diagnose MRSA infection nor to guide or monitor treatment for MRSA infections. Performed at [facility], [place] [address] [place], [place] [address]      Labs: CBC: Recent Labs  Lab 01/03/21 1102 01/04/21 0307 01/05/21 0233 01/06/21 0741  WBC 7.3 6.4 7.2 6.4  NEUTROABS 4.9  --   --   --   HGB 7.5* 7.1* 7.1* 8.2*  HCT 22.9* 21.7* 21.2* 25.1*  MCV 97.4 98.6 96.4 98.0  PLT 127* 136* 124* 962   Basic Metabolic Panel: Recent Labs  Lab 01/02/21 1458 01/03/21 1102 01/04/21 0307 01/05/21 0233 01/06/21 0741  NA  --  134* 132* 132* 133*  K 4.0 3.8 3.9 3.9 3.8  CL  --  101 98 99 97*  CO2  --  _0 GLUCOSE  --  98 73 90 87  BUN  --  24* 35* 17 29*  CREATININE  --  2.19* 2.43* 1.56* 2.25*  CALCIUM  --  8.3* 8.2* 7.9* 8.5*  MG  --   --  1.9 1.8  --   PHOS  --   --   --   --  2.6   Liver Function Tests: Recent Labs  Lab 01/06/21 0741  ALBUMIN 3.1*   CBG: Recent Labs  Lab  01/06/21 2146 01/07/21 0651 01/07/21 1008 01/07/21 1146 01/07/21 1629  GLUCAP 146* 138* 85 81 150*    Time spent: 35 minutes  Signed:  Berle Mull  [facility] 01/07/2021 9:26 AM

## 2021-01-11 ENCOUNTER — Telehealth: Payer: Self-pay | Admitting: Hematology and Oncology

## 2021-01-11 NOTE — Telephone Encounter (Signed)
R/s appts per 3/29 sch msg. Pt's son is aware.

## 2021-01-12 ENCOUNTER — Other Ambulatory Visit: Payer: Self-pay

## 2021-01-12 ENCOUNTER — Emergency Department (HOSPITAL_COMMUNITY)
Admission: EM | Admit: 2021-01-12 | Discharge: 2021-01-12 | Disposition: A | Discharge status: Home / Self Care | Payer: Medicare PPO | Attending: Emergency Medicine | Admitting: Emergency Medicine

## 2021-01-12 ENCOUNTER — Encounter (HOSPITAL_COMMUNITY): Payer: Self-pay | Admitting: Emergency Medicine

## 2021-01-12 ENCOUNTER — Emergency Department (HOSPITAL_COMMUNITY): Payer: Medicare PPO

## 2021-01-12 ENCOUNTER — Ambulatory Visit (HOSPITAL_COMMUNITY): Payer: Medicare PPO | Admitting: Physician Assistant

## 2021-01-12 DIAGNOSIS — E1122 Type 2 diabetes mellitus with diabetic chronic kidney disease: Secondary | ICD-10-CM | POA: Insufficient documentation

## 2021-01-12 DIAGNOSIS — E1159 Type 2 diabetes mellitus with other circulatory complications: Secondary | ICD-10-CM | POA: Insufficient documentation

## 2021-01-12 DIAGNOSIS — N186 End stage renal disease: Secondary | ICD-10-CM | POA: Insufficient documentation

## 2021-01-12 DIAGNOSIS — Z7984 Long term (current) use of oral hypoglycemic drugs: Secondary | ICD-10-CM | POA: Diagnosis not present

## 2021-01-12 DIAGNOSIS — D631 Anemia in chronic kidney disease: Secondary | ICD-10-CM | POA: Insufficient documentation

## 2021-01-12 DIAGNOSIS — Z7901 Long term (current) use of anticoagulants: Secondary | ICD-10-CM | POA: Insufficient documentation

## 2021-01-12 DIAGNOSIS — I120 Hypertensive chronic kidney disease with stage 5 chronic kidney disease or end stage renal disease: Secondary | ICD-10-CM | POA: Insufficient documentation

## 2021-01-12 DIAGNOSIS — Z992 Dependence on renal dialysis: Secondary | ICD-10-CM | POA: Insufficient documentation

## 2021-01-12 DIAGNOSIS — Z950 Presence of cardiac pacemaker: Secondary | ICD-10-CM | POA: Diagnosis not present

## 2021-01-12 DIAGNOSIS — R0602 Shortness of breath: Secondary | ICD-10-CM

## 2021-01-12 LAB — BASIC METABOLIC PANEL
Anion gap: 8 (ref 5–15)
BUN: 16 mg/dL (ref 8–23)
CO2: 33 mmol/L — ABNORMAL HIGH (ref 22–32)
Calcium: 8.7 mg/dL — ABNORMAL LOW (ref 8.9–10.3)
Chloride: 98 mmol/L (ref 98–111)
Creatinine, Ser: 1.92 mg/dL — ABNORMAL HIGH (ref 0.44–1.00)
GFR, Estimated: 25 mL/min — ABNORMAL LOW (ref >60)
Glucose, Bld: 83 mg/dL (ref 70–99)
Potassium: 3.9 mmol/L (ref 3.5–5.1)
Sodium: 139 mmol/L (ref 135–145)

## 2021-01-12 LAB — CBC
HCT: 27.1 % — ABNORMAL LOW (ref 36.0–46.0)
Hemoglobin: 8.7 g/dL — ABNORMAL LOW (ref 12.0–15.0)
MCH: 32.8 pg (ref 26.0–34.0)
MCHC: 32.1 g/dL (ref 30.0–36.0)
MCV: 102.3 fL — ABNORMAL HIGH (ref 80.0–100.0)
Platelets: 172 10*3/uL (ref 150–400)
RBC: 2.65 MIL/uL — ABNORMAL LOW (ref 3.87–5.11)
RDW: 17.7 % — ABNORMAL HIGH (ref 11.5–15.5)
WBC: 6.4 10*3/uL (ref 4.0–10.5)
nRBC: 0 % (ref 0.0–0.2)

## 2021-01-12 NOTE — ED Provider Notes (Signed)
[facility] EMERGENCY DEPARTMENT Provider Note   CSN: [id number] Arrival date & time: 01/12/21  1021     History Chief Complaint  Patient presents with  . Shortness of Breath    [name] is a 87 y.o. female.  HPI Patient presents with shortness of breath.  Began this morning.  Recent admission for same.  Discharged 5 days ago.  Thought to be secondary to volume overload secondary to her dialysis.  During admission had cardioversion for her A. fib.  States she has had really no coughing.  States she was dialyzed yesterday.  States they went well but over the next 2 dialysis sessions are planning on taking off more fluid.  Patient not necessarily feel fluid overload.  States she is more short of breath than when she was discharged to the hospital but was still somewhat short of breath when she was discharged.  No fevers or chills.  No chest pain.    Past Medical History:  Diagnosis Date  . Anemia   . Arthritis   . Benign paroxysmal positional vertigo 04/06/2010  . Benign positional vertigo   . 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 CAROTID BRUIT 02/24/2008  . Complication of anesthesia    took a long time to wake up with knee replacement  . DIABETES MELLITUS, TYPE II 02/24/2008  . ESRD (end stage renal disease) on dialysis ([place])    tues thurs sat [facility] kidney center sees [place] [facility]  . GERD (gastroesophageal reflux disease)   . Glaucoma   . Gout   . HYPERTENSION 02/24/2008  . Multiple myeloma ([place])   . Obstructive sleep apnea   . Pancytopenia ([place])   . Sick sinus syndrome ([place])   . Wears dentures   . Wears glasses     Patient Active Problem List   Diagnosis Date Noted  . Protein-calorie malnutrition, severe 12/31/2020  . Atrial flutter ([place]) 12/31/2020  . Fluid overload 12/30/2020  . Weight loss, non-intentional 12/10/2020  . Pain due to onychomycosis of toenails of both feet 12/08/2020  . Type 2 diabetes mellitus with vascular disease ([place])  12/08/2020  . Persistent atrial fibrillation ([place]) 11/08/2020  . Gastritis 10/27/2020  . Delayed gastric emptying 10/27/2020  . Atypical atrial flutter ([place]) 10/25/2020  . Secondary hypercoagulable state ([place]) 10/25/2020  . Pulmonary edema 09/10/2020  . Pleural effusion, bilateral 09/10/2020  . Acute respiratory failure with hypoxia ([place]) 04/02/2020  . Elevated troponin 04/02/2020  . Hypotension 03/23/2020  . Left hip pain 01/12/2020  . Vitamin B12 deficiency 10/20/2019  . Deficiency anemia 09/26/2019  . Anaphylactic shock, unspecified, sequela 07/25/2019  . Complication of vascular dialysis catheter 07/21/2019  . Peripheral neuropathy due to chemotherapy ([place]) 07/04/2019  . Encounter for removal of sutures 07/03/2019  . Goals of care, counseling/discussion 05/30/2019  . Pressure injury of skin 05/26/2019  . Yeast infection of the skin 04/04/2019  . Cancer associated pain 03/26/2019  . Moderate protein-calorie malnutrition ([place]) 03/24/2019  . Nausea in adult 03/14/2019  . ESRD (end stage renal disease) on dialysis ([place]) 03/14/2019  . Other constipation 03/14/2019  . Coagulation defect, unspecified ([place]) 03/13/2019  . Pancytopenia, acquired ([place]) 03/12/2019  . Anemia in chronic kidney disease 03/11/2019  . Bradycardia, unspecified 03/11/2019  . Iron deficiency anemia, unspecified 03/11/2019  . Secondary hyperparathyroidism of renal origin ([place]) 03/11/2019  . Multiple myeloma not having achieved remission ([place]) 03/03/2019  . Hypokalemia 02/25/2019  . Anemia   . Shingles 03/09/2015  . Sick  sinus syndrome ([place]) 02/03/2015  . Shortness of breath 02/01/2014  . Fatigue 02/01/2014  . BENIGN PAROXYSMAL POSITIONAL VERTIGO 04/06/2010  . BRADYCARDIA 01/27/2009  . Diabetes mellitus ([place]) 02/24/2008  . Obstructive sleep apnea 02/24/2008  . 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 ALLERGIC RHINITIS 02/24/2008  . CAROTID BRUIT 02/24/2008    Past Surgical History:  Procedure  Laterality Date  . A/V FISTULAGRAM Right 03/08/2020   Procedure: A/V FISTULAGRAM- Right Arm;  Surgeon: Waynetta Sandy, MD;  Location: [facility];  Service: Cardiovascular;  Laterality: Right;  . A/V FISTULAGRAM N/A 06/28/2020   Procedure: A/V FISTULAGRAM - Right Upper Arm;  Surgeon: Waynetta Sandy, MD;  Location: [facility];  Service: Cardiovascular;  Laterality: N/A;  . ABDOMINAL HYSTERECTOMY     1980's  . AV FISTULA PLACEMENT Right 03/11/2019   Procedure: CREATION RIGHT ARM RADIOCEPHALIC ARTERIOVENOUS FISTULA;  Surgeon: Angelia Mould, MD;  Location: [facility];  Service: Vascular;  Laterality: Right;  . AV FISTULA PLACEMENT Right 03/25/2020   Procedure: right arm ARTERIOVENOUS (AV) FISTULA CREATION;  Surgeon: Waynetta Sandy, MD;  Location: [facility];  Service: Vascular;  Laterality: Right;  . BACK SURGERY     x 3  . BIOPSY  06/24/2019   Procedure: BIOPSY;  Surgeon: Ronald Lobo, MD;  Location: [facility];  Service: Endoscopy;;  . BUBBLE STUDY  01/07/2021   Procedure: BUBBLE STUDY;  Surgeon: Sueanne Margarita, MD;  Location: [facility];  Service: Cardiovascular;;  . CARDIOVERSION N/A 01/07/2021   Procedure: CARDIOVERSION;  Surgeon: Sueanne Margarita, MD;  Location[facility];  Service: Cardiovascular;  Laterality: N/A;  . EP IMPLANTABLE DEVICE N/A 05/16/2016   Procedure: PPM Generator Changeout;  Surgeon: Thompson Grayer, MD;  Location: [facility];  Service: Cardiovascular;  Laterality: N/A;  . ESOPHAGOGASTRODUODENOSCOPY (EGD) WITH PROPOFOL N/A 06/24/2019   Procedure: ESOPHAGOGASTRODUODENOSCOPY (EGD) WITH PROPOFOL;  Surgeon: Ronald Lobo, MD;  Location: [facility];  Service: Endoscopy;  Laterality: N/A;  . FISTULA SUPERFICIALIZATION Right 03/11/2019   Procedure: FISTULA SUPERFICIALIZATION;  Surgeon: Angelia Mould, MD;  Location: [facility];  Service: Vascular;  Laterality: Right;  . INSERTION OF DIALYSIS CATHETER Right 03/25/2020    Procedure: INSERTION OF DIALYSIS CATHETER, right internal jugular;  Surgeon: Waynetta Sandy, MD;  Location: [facility];  Service: Vascular;  Laterality: Right;  . IR FLUORO GUIDE CV LINE RIGHT  03/07/2019  . IR US GUIDE VASC ACCESS RIGHT  03/07/2019  . KNEE SURGERY Right    2003  . KNEE SURGERY Left    2011  . PACEMAKER INSERTION    . PERIPHERAL VASCULAR BALLOON ANGIOPLASTY Right 06/28/2020   Procedure: PERIPHERAL VASCULAR BALLOON ANGIOPLASTY;  Surgeon: Waynetta Sandy, MD;  Location: [facility];  Service: Cardiovascular;  Laterality: Right;  . TEE WITHOUT CARDIOVERSION N/A 01/07/2021   Procedure: TRANSESOPHAGEAL ECHOCARDIOGRAM (TEE);  Surgeon: Sueanne Margarita, MD;  Location: [facility]COPY;  Service: Cardiovascular;  Laterality: N/A;     OB History   No obstetric history on file.     Family History  Problem Relation Age of Onset  . Congestive Heart Failure Mother   . Tuberculosis Mother   . Multiple myeloma Mother   . Bone cancer Father   . Arthritis Father   . Breast cancer Sister   . Colon cancer Sister   . Hypertension Sister   . Dementia Sister   . Alcohol abuse Son     Social History   Tobacco  Use  . Smoking status: Never Smoker  . Smokeless tobacco: Never Used  Vaping Use  . Vaping Use: Never used  Substance Use Topics  . Alcohol use: No  . Drug use: No    Home Medications Prior to Admission medications   Medication Sig Start Date End Date Taking? Authorizing Provider  acetaminophen (TYLENOL) 500 MG tablet Take 1,000 mg by mouth 2 (two) times daily as needed for mild pain or moderate pain.     [provider]  acyclovir (ZOVIRAX) 400 MG tablet Take 1 tablet (400 mg total) by mouth daily. 09/15/20   Heath Lark, MD  allopurinol (ZYLOPRIM) 100 MG tablet Take 0.5 tablets (50 mg total) by mouth daily. 03/12/19   Nita Sells, MD  apixaban (ELIQUIS) 2.5 MG TABS tablet Take 1 tablet (2.5 mg total) by mouth 2 (two) times daily.  10/25/20   Fenton, Clint R, PA  cholecalciferol (VITAMIN D3) 25 MCG (1000 UT) tablet Take 2,000 Units by mouth daily.    [provider]  dexamethasone (DECADRON) 2 MG tablet Take 1 tablet (2 mg total) by mouth daily. 12/10/20   Heath Lark, MD  dorzolamide (TRUSOPT) 2 % ophthalmic solution Place 1 drop into the left eye 2 (two) times daily.  12/21/17   [provider]  fluorometholone (FML) 0.1 % ophthalmic suspension Place 1 drop into the right eye daily.  02/01/19   [provider]  glimepiride (AMARYL) 1 MG tablet Take 0.5 mg by mouth daily.  04/07/20   [provider]  lactulose (CHRONULAC) 10 GM/15ML solution Take 15 mLs by mouth 3 (three) times daily. 12/24/20   [provider]  lidocaine-prilocaine (EMLA) cream Apply 1 application topically as needed [facility]).  05/26/20   [provider]  LUMIGAN 0.01 % SOLN Place 1 drop into the left eye at bedtime.  02/01/19   [provider]  LYRICA 50 MG capsule Take 1 capsule (50 mg total) by mouth at bedtime. 12/20/20   Heath Lark, MD  Methoxy PEG-Epoetin Beta (MIRCERA IJ) Mircera 07/01/20 06/30/21  [provider]  metoCLOPramide (REGLAN) 5 MG tablet Take 1 tablet (5 mg total) by mouth 3 (three) times daily before meals. 10/27/20   Heath Lark, MD  midodrine (PROAMATINE) 10 MG tablet Take 10 mg by mouth as directed. Low bp, and before dialysis 12/28/20   [provider]  Multiple Vitamin (MULTIVITAMIN) tablet Take 1 tablet by mouth daily.    [provider]  Nutritional Supplements (,FEEDING SUPPLEMENT, PROSOURCE PLUS) liquid Take 30 mLs by mouth 3 (three) times daily between meals. 01/07/21   Lavina Hamman, MD  Nutritional Supplements (FEEDING SUPPLEMENT, NEPRO CARB STEADY,) LIQD Take 237 mLs by mouth 2 (two) times daily between meals. 01/08/21   Lavina Hamman, MD  ondansetron (ZOFRAN) 4 MG tablet Take 1 tablet (4 mg total) by mouth every 8 (eight) hours as needed for nausea.  10/27/20   Heath Lark, MD  oxyCODONE (OXY IR/ROXICODONE) 5 MG immediate release tablet Take 1 tablet (5 mg total) by mouth every 4 (four) hours as needed for severe pain. 12/30/20   Heath Lark, MD  pantoprazole (PROTONIX) 40 MG tablet Take 1 tablet (40 mg total) by mouth daily as needed (acid reflux/indigestion.). 06/26/19   Heath Lark, MD  polyethylene glycol (MIRALAX / GLYCOLAX) 17 g packet Take 17 g by mouth daily.    [provider]  senna-docusate (SENOKOT-S) 8.6-50 MG tablet Take 2 tablets by mouth 2 (two) times daily. 03/11/19  Nita Sells, MD  sodium chloride (OCEAN) 0.65 % SOLN nasal spray Place 1 spray into both nostrils 4 (four) times daily as needed for congestion.    [provider]    Allergies    Aspirin, Hydrocodone-acetaminophen, Morphine, Penicillins, Betaine, Dacarbazine, Ketorolac tromethamine, Brimonidine, Diltiazem hcl, Hydrocodone-acetaminophen, Neurontin [gabapentin], and Sulfacetamide sodium  Review of Systems   Review of Systems  Constitutional: Negative for appetite change.  Respiratory: Positive for shortness of breath.   Cardiovascular: Negative for chest pain and leg swelling.  Gastrointestinal: Negative for abdominal pain.  Genitourinary: Negative for flank pain.  Musculoskeletal: Negative for back pain.  Skin: Negative for rash.  Neurological: Negative for weakness.  Psychiatric/Behavioral: Negative for confusion.    Physical Exam Updated Vital Signs BP 138/69   Pulse 72   Temp 98.2 F (36.8 C) (Oral)   Resp (!) 22   Ht 5' 5"  (1.651 m)   Wt 51.3 kg   SpO2 100%   BMI 18.82 kg/m   Physical Exam Vitals and nursing note reviewed.  HENT:     Head: Normocephalic.  Eyes:     Pupils: Pupils are equal, round, and reactive to light.  Neck:     Vascular: No JVD.  Cardiovascular:     Rate and Rhythm: Regular rhythm.  Pulmonary:     Comments: Some rales bilateral bases. Chest:     Chest wall: No tenderness.  Abdominal:      Tenderness: There is no abdominal tenderness.  Musculoskeletal:     Right lower leg: No edema.     Left lower leg: No edema.  Skin:    General: Skin is warm.     Capillary Refill: Capillary refill takes less than 2 seconds.  Neurological:     Mental Status: She is alert and oriented to person, place, and time.  Psychiatric:        Mood and Affect: Mood normal.     ED Results / Procedures / Treatments   Labs (all labs ordered are listed, but only abnormal results are displayed) Labs Reviewed  BASIC METABOLIC PANEL - Abnormal; Notable for the following components:      Result Value   CO2 33 (*)    Creatinine, Ser 1.92 (*)    Calcium 8.7 (*)    GFR, Estimated 25 (*)    All other components within normal limits  CBC - Abnormal; Notable for the following components:   RBC 2.65 (*)    Hemoglobin 8.7 (*)    HCT 27.1 (*)    MCV 102.3 (*)    RDW 17.7 (*)    All other components within normal limits    EKG EKG Interpretation  Date/Time:  Wednesday January 12 2021 10:27:14 EDT Ventricular Rate:  77 PR Interval:  165 QRS Duration: 141 QT Interval:  448 QTC Calculation: 504 R Axis:   -73 Text Interpretation: A-V dual-paced complexes w/ some inhibition No further analysis attempted due to paced rhythm Confirmed by Davonna Belling [phone]) on 01/12/2021 10:37:41 AM   Radiology DG Chest 2 View  Result Date: 01/12/2021 CLINICAL DATA:  Shortness of breath.  In stage renal disease EXAM: CHEST - 2 VIEW COMPARISON:  January 03, 2021 FINDINGS: There is a persistent left pleural effusion with suspected atelectasis left base. Lungs elsewhere are clear. There is cardiomegaly with pulmonary vascularity normal. Pacemaker leads attached to right atrium and right ventricle. There is aortic atherosclerosis. No adenopathy. Bones osteoporotic. There is extensive arthropathy in each shoulder. Remodeling in right humeral  head may be indicative of underlying avascular necrosis. IMPRESSION: Persistent  left pleural effusion with suspected left base atelectasis. Lungs elsewhere clear. Stable cardiac enlargement. Pacemaker leads attached to right atrium and right ventricle. Aortic Atherosclerosis (ICD10-I70.0). Electronically Signed   By: Lowella Grip III M.D.   On: 01/12/2021 11:10    Procedures Procedures   Medications Ordered in ED Medications - No data to display  ED Course  I have reviewed the triage vital signs and the nursing notes.  Pertinent labs & imaging results that were available during my care of the patient were reviewed by me and considered in my medical decision making (see chart for details).    MDM Rules/Calculators/A&P                          Patient shortness of breath.  Recent admission for same.  Had been in A. fib previously that was thought to contribute but now not in A. fib.  Has had increase in her weight.  Dialysis patient was dialyzed yesterday and is due for tomorrow Friday and Saturday in the attempt to try and get the weight off her.  Not hypoxic ambulation.  X-ray reassuring.  Do not see pneumonia.  Lab work overall reassuring.  Looks as if her dry weight is supposed to be around 49 kg and she is up to 51 kg and was 50 kg on discharge.  Discussed with Dr. Osborne Casco from nephrology.  We feels it is safe for follow-up tomorrow for dialysis.  They have had difficulty getting fluid off in the past due to hypotension.  Did not appear to need admission to the hospital at this time. Final Clinical Impression(s) / ED Diagnoses Final diagnoses:  Shortness of breath  End stage renal disease on dialysis [facility])    Rx / DC Orders ED Discharge Orders    None       Davonna Belling, MD 01/12/21 1540

## 2021-01-12 NOTE — ED Notes (Signed)
Patient ambulated around room with standby assist on room air. Patient maintained SpO2 of 99% while ambulating.

## 2021-01-12 NOTE — Discharge Instructions (Addendum)
Go to dialysis the next 3 days plan.  They should help be able to get the extra fluid off and hopefully help you breathe better.

## 2021-01-12 NOTE — ED Triage Notes (Signed)
Patient BIB [facility] from home for shortness of breath that started this morning, patient admitted and discharged last week for same. Dialysis T,Th,Sat, last full treatment yesterday, access in upper right arm. Patient alert, oriented, and in no apparent distress at this time.

## 2021-01-12 NOTE — ED Notes (Signed)
Patient calling out for increased in anxiety and shortness of breath. SpO2 100% on room air.

## 2021-01-19 ENCOUNTER — Ambulatory Visit (HOSPITAL_COMMUNITY)
Admit: 2021-01-19 | Discharge: 2021-01-19 | Disposition: A | Discharge status: Home / Self Care | Payer: Medicare PPO | Source: Ambulatory Visit | Attending: Physician Assistant | Admitting: Physician Assistant

## 2021-01-19 ENCOUNTER — Inpatient Hospital Stay: Payer: Medicare PPO

## 2021-01-19 ENCOUNTER — Other Ambulatory Visit: Payer: Self-pay

## 2021-01-19 ENCOUNTER — Inpatient Hospital Stay: Payer: Medicare PPO | Attending: Hematology and Oncology | Admitting: Hematology and Oncology

## 2021-01-19 ENCOUNTER — Encounter (HOSPITAL_COMMUNITY): Payer: Self-pay | Admitting: Physician Assistant

## 2021-01-19 ENCOUNTER — Encounter: Payer: Self-pay | Admitting: Hematology and Oncology

## 2021-01-19 VITALS — BP 124/50 | HR 72 | Ht 65.0 in | Wt 108.6 lb

## 2021-01-19 DIAGNOSIS — I313 Pericardial effusion (noninflammatory): Secondary | ICD-10-CM | POA: Diagnosis not present

## 2021-01-19 DIAGNOSIS — I081 Rheumatic disorders of both mitral and tricuspid valves: Secondary | ICD-10-CM | POA: Insufficient documentation

## 2021-01-19 DIAGNOSIS — Z9221 Personal history of antineoplastic chemotherapy: Secondary | ICD-10-CM | POA: Diagnosis not present

## 2021-01-19 DIAGNOSIS — Z79899 Other long term (current) drug therapy: Secondary | ICD-10-CM | POA: Insufficient documentation

## 2021-01-19 DIAGNOSIS — I509 Heart failure, unspecified: Secondary | ICD-10-CM | POA: Insufficient documentation

## 2021-01-19 DIAGNOSIS — I5022 Chronic systolic (congestive) heart failure: Secondary | ICD-10-CM | POA: Diagnosis not present

## 2021-01-19 DIAGNOSIS — D6869 Other thrombophilia: Secondary | ICD-10-CM | POA: Diagnosis not present

## 2021-01-19 DIAGNOSIS — I132 Hypertensive heart and chronic kidney disease with heart failure and with stage 5 chronic kidney disease, or end stage renal disease: Secondary | ICD-10-CM | POA: Diagnosis not present

## 2021-01-19 DIAGNOSIS — I441 Atrioventricular block, second degree: Secondary | ICD-10-CM | POA: Diagnosis not present

## 2021-01-19 DIAGNOSIS — C9000 Multiple myeloma not having achieved remission: Secondary | ICD-10-CM

## 2021-01-19 DIAGNOSIS — Z7901 Long term (current) use of anticoagulants: Secondary | ICD-10-CM | POA: Diagnosis not present

## 2021-01-19 DIAGNOSIS — M25552 Pain in left hip: Secondary | ICD-10-CM | POA: Diagnosis not present

## 2021-01-19 DIAGNOSIS — I495 Sick sinus syndrome: Secondary | ICD-10-CM | POA: Insufficient documentation

## 2021-01-19 DIAGNOSIS — N186 End stage renal disease: Secondary | ICD-10-CM | POA: Diagnosis not present

## 2021-01-19 DIAGNOSIS — Z8249 Family history of ischemic heart disease and other diseases of the circulatory system: Secondary | ICD-10-CM | POA: Diagnosis not present

## 2021-01-19 DIAGNOSIS — Z992 Dependence on renal dialysis: Secondary | ICD-10-CM | POA: Insufficient documentation

## 2021-01-19 DIAGNOSIS — D61818 Other pancytopenia: Secondary | ICD-10-CM | POA: Diagnosis not present

## 2021-01-19 DIAGNOSIS — I700 Atherosclerosis of aorta: Secondary | ICD-10-CM | POA: Insufficient documentation

## 2021-01-19 DIAGNOSIS — I4892 Unspecified atrial flutter: Secondary | ICD-10-CM | POA: Diagnosis not present

## 2021-01-19 DIAGNOSIS — R0602 Shortness of breath: Secondary | ICD-10-CM | POA: Diagnosis present

## 2021-01-19 DIAGNOSIS — E1122 Type 2 diabetes mellitus with diabetic chronic kidney disease: Secondary | ICD-10-CM | POA: Diagnosis not present

## 2021-01-19 DIAGNOSIS — K5900 Constipation, unspecified: Secondary | ICD-10-CM | POA: Insufficient documentation

## 2021-01-19 DIAGNOSIS — J90 Pleural effusion, not elsewhere classified: Secondary | ICD-10-CM | POA: Insufficient documentation

## 2021-01-19 DIAGNOSIS — I4819 Other persistent atrial fibrillation: Secondary | ICD-10-CM | POA: Insufficient documentation

## 2021-01-19 LAB — CBC WITH DIFFERENTIAL/PLATELET
Abs Immature Granulocytes: 0.02 10*3/uL (ref 0.00–0.07)
Basophils Absolute: 0 10*3/uL (ref 0.0–0.1)
Basophils Relative: 0 %
Eosinophils Absolute: 0.1 10*3/uL (ref 0.0–0.5)
Eosinophils Relative: 1 %
HCT: 25.4 % — ABNORMAL LOW (ref 36.0–46.0)
Hemoglobin: 8.3 g/dL — ABNORMAL LOW (ref 12.0–15.0)
Immature Granulocytes: 0 %
Lymphocytes Relative: 15 %
Lymphs Abs: 1 10*3/uL (ref 0.7–4.0)
MCH: 32.2 pg (ref 26.0–34.0)
MCHC: 32.7 g/dL (ref 30.0–36.0)
MCV: 98.4 fL (ref 80.0–100.0)
Monocytes Absolute: 0.4 10*3/uL (ref 0.1–1.0)
Monocytes Relative: 6 %
Neutro Abs: 5.1 10*3/uL (ref 1.7–7.7)
Neutrophils Relative %: 78 %
Platelets: 171 10*3/uL (ref 150–400)
RBC: 2.58 MIL/uL — ABNORMAL LOW (ref 3.87–5.11)
RDW: 15.1 % (ref 11.5–15.5)
WBC: 6.6 10*3/uL (ref 4.0–10.5)
nRBC: 0 % (ref 0.0–0.2)

## 2021-01-19 LAB — COMPREHENSIVE METABOLIC PANEL
ALT: 24 U/L (ref 0–44)
AST: 25 U/L (ref 15–41)
Albumin: 3.2 g/dL — ABNORMAL LOW (ref 3.5–5.0)
Alkaline Phosphatase: 51 U/L (ref 38–126)
Anion gap: 12 (ref 5–15)
BUN: 37 mg/dL — ABNORMAL HIGH (ref 8–23)
CO2: 32 mmol/L (ref 22–32)
Calcium: 8.3 mg/dL — ABNORMAL LOW (ref 8.9–10.3)
Chloride: 98 mmol/L (ref 98–111)
Creatinine, Ser: 1.91 mg/dL — ABNORMAL HIGH (ref 0.44–1.00)
GFR, Estimated: 25 mL/min — ABNORMAL LOW (ref >60)
Glucose, Bld: 128 mg/dL — ABNORMAL HIGH (ref 70–99)
Potassium: 3.5 mmol/L (ref 3.5–5.1)
Sodium: 142 mmol/L (ref 135–145)
Total Bilirubin: 0.6 mg/dL (ref 0.3–1.2)
Total Protein: 5.4 g/dL — ABNORMAL LOW (ref 6.5–8.1)

## 2021-01-19 NOTE — Progress Notes (Signed)
[place] OFFICE PROGRESS NOTE  Patient Care Team: Lucianne Lei, MD as PCP - General (Family Medicine) Thompson Grayer, MD as PCP - Electrophysiology (Cardiology) Josue Hector, MD as PCP - Cardiology (Cardiology)  ASSESSMENT & PLAN:  Multiple myeloma not having achieved remission [facility]) Recent to myeloma panel was stable She has been off treatment for several months I will space out her appointment to every 3 months Continue supportive care I recommend weaning her off dexamethasone  ESRD (end stage renal disease) on dialysis [facility]) We discussed the limitations of treatment options due to her end-stage renal disease She will continue hemodialysis  Left hip pain She has multifactorial bone pain She is not a candidate for aggressive management given her age and comorbidities I recommend increased dose of vitamin D supplement She has been prescribed oxycodone and that seems to help We discussed narcotic refill policy  Pancytopenia, acquired [facility]) She has multifactorial anemia I would defer to nephrologist for management She does not need blood transfusion support today   No orders of the defined types were placed in this encounter.   All questions were answered. The patient knows to call the clinic with any problems, questions or concerns. The total time spent in the appointment was 20 minutes encounter with patients including review of chart and various tests results, discussions about plan of care and coordination of care plan   Heath Lark, MD 01/19/2021 11:27 AM  INTERVAL HISTORY: Please see below for problem oriented charting. She returns with her son for further follow-up She has not lost any weight recently She tolerated dialysis well She has multiple ER visits for many reasons Her hip pain is stable No recent infection, fever or chills She continues on aggressive laxative therapy for constipation  SUMMARY OF ONCOLOGIC HISTORY: Oncology History   Multiple myeloma not having achieved remission ([place])  03/03/2019 Initial Diagnosis   Multiple myeloma not having achieved remission ([place])   03/03/2019 Imaging   1. Round and oval lucent/lytic lesions in the skull, bilateral humeri, distal left femur, and possibly also in the pelvis are compatible with multiple myeloma. No right lower extremity lytic lesion identified. No pathologic fracture. 2. Advanced degenerative changes in the spine with no vertebral myeloma evident by plain radiography. 3. Advanced degenerative changes in the right upper extremity. Bilateral knee arthroplasty. 4.  Aortic Atherosclerosis (ICD10-I70.0).   03/04/2019 -  Chemotherapy   The patient had bortezomib for chemotherapy treatment.    03/04/2019 Bone Marrow Biopsy   Bone Marrow, Aspirate,Biopsy, and Clot - HYPERCELLULAR BONE MARROW FOR AGE WITH PLASMACYTOSIS. - SEE COMMENT. PERIPHERAL BLOOD: - NORMOCYTIC-NORMOCHROMIC ANEMIA. Diagnosis Note The bone marrow is hypercellular for age with increased number of atypical plasma cells averaging 30% of all cells in the aspirate associated with interstitial infiltrates and numerous variably sized aggregates in the clot and biopsy sections. 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 Confirmatory in situ hybridization for kappa and lambda as well as CD138 will be performed and the results reported in an addendum. The background shows trilineage hematopoiesis with mild non-specific changes primarily involving the erythroid series. Correlation with cytogenetic and FISH studies is recommended.    06/20/2019 -  Chemotherapy   The patient had dexamethasone (DECADRON) tablet 20 mg, 20 mg (100 % of original dose 20 mg), Oral,  Once, 19 of 21 cycles Dose modification: 20 mg (original dose 20 mg, Cycle 1), 8 mg (original dose 8 mg, Cycle 10) Administration: 20 mg (06/20/2019), 20 mg (06/27/2019),  20 mg (07/04/2019), 20 mg (07/11/2019), 20 mg (07/18/2019), 20 mg (07/25/2019), 20 mg  (08/08/2019), 20 mg (08/15/2019), 20 mg (09/05/2019), 20 mg (12/01/2019), 20 mg (09/26/2019), 20 mg (10/20/2019), 20 mg (11/10/2019), 20 mg (12/22/2019), 20 mg (01/12/2020), 8 mg (02/02/2020), 8 mg (02/23/2020), 8 mg (03/22/2020), 8 mg (04/12/2020), 8 mg (05/03/2020), 8 mg (05/31/2020), 8 mg (06/23/2020), 8 mg (07/14/2020), 8 mg (08/04/2020), 8 mg (08/25/2020) daratumumab-hyaluronidase-fihj (DARZALEX FASPRO) 1800-30000 MG-UT/15ML chemo SQ injection 1,800 mg, 1,800 mg, Subcutaneous,  Once, 19 of 21 cycles Administration: 1,800 mg (06/20/2019), 1,800 mg (06/27/2019), 1,800 mg (07/04/2019), 1,800 mg (07/11/2019), 1,800 mg (07/18/2019), 1,800 mg (07/25/2019), 1,800 mg (08/08/2019), 1,800 mg (08/15/2019), 1,800 mg (09/05/2019), 1,800 mg (12/01/2019), 1,800 mg (02/02/2020), 1,800 mg (09/26/2019), 1,800 mg (10/20/2019), 1,800 mg (11/10/2019), 1,800 mg (12/22/2019), 1,800 mg (01/12/2020), 1,800 mg (02/23/2020), 1,800 mg (03/22/2020), 1,800 mg (04/12/2020), 1,800 mg (05/03/2020), 1,800 mg (05/31/2020), 1,800 mg (06/23/2020), 1,800 mg (07/14/2020), 1,800 mg (08/04/2020), 1,800 mg (08/25/2020)  for chemotherapy treatment.    01/19/2021 Cancer Staging   Staging form: Plasma Cell Myeloma and Plasma Cell Disorders, AJCC 8th Edition - Clinical stage from 01/19/2021: RISS Stage III (Beta-2-microglobulin (mg/L): 5.6, Albumin (g/dL): 3.2, ISS: Stage III, High-risk cytogenetics: Absent, LDH: Elevated) - Signed by Heath Lark, MD on 01/19/2021 Beta 2 microglobulin range (mg/L): Greater than or equal to 5.5 Albumin range (g/dL): Less than 3.5 Cytogenetics: Other mutation     REVIEW OF SYSTEMS:   Constitutional: Denies fevers, chills or abnormal weight loss Eyes: Denies blurriness of vision Ears, nose, mouth, throat, and face: Denies mucositis or sore throat Respiratory: Denies cough, dyspnea or wheezes Cardiovascular: Denies palpitation, chest discomfort or lower extremity swelling Skin: Denies abnormal skin rashes Lymphatics: Denies new lymphadenopathy or easy  bruising Neurological:Denies numbness, tingling or new weaknesses Behavioral/Psych: Mood is stable, no new changes  All other systems were reviewed with the patient and are negative.  I have reviewed the past medical history, past surgical history, social history and family history with the patient and they are unchanged from previous note.  ALLERGIES:  is allergic to aspirin, hydrocodone-acetaminophen, morphine, penicillins, betaine, dacarbazine, ketorolac tromethamine, brimonidine, diltiazem hcl, hydrocodone-acetaminophen, neurontin [gabapentin], and sulfacetamide sodium.  MEDICATIONS:  Current Outpatient Medications  Medication Sig Dispense Refill  . acetaminophen (TYLENOL) 500 MG tablet Take 1,000 mg by mouth 2 (two) times daily as needed for mild pain or moderate pain.     Marland Kitchen acyclovir (ZOVIRAX) 400 MG tablet Take 1 tablet (400 mg total) by mouth daily. 90 tablet 6  . allopurinol (ZYLOPRIM) 100 MG tablet Take 0.5 tablets (50 mg total) by mouth daily. 30 tablet 0  . apixaban (ELIQUIS) 2.5 MG TABS tablet Take 1 tablet (2.5 mg total) by mouth 2 (two) times daily. 60 tablet 3  . cholecalciferol (VITAMIN D3) 25 MCG (1000 UT) tablet Take 2,000 Units by mouth daily.    . dorzolamide (TRUSOPT) 2 % ophthalmic solution Place 1 drop into the left eye 2 (two) times daily.   1  . fluorometholone (FML) 0.1 % ophthalmic suspension Place 1 drop into the right eye daily.     Marland Kitchen glimepiride (AMARYL) 1 MG tablet Take 0.5 mg by mouth daily.     Marland Kitchen lactulose (CHRONULAC) 10 GM/15ML solution Take 15 mLs by mouth 3 (three) times daily.    Marland Kitchen lidocaine-prilocaine (EMLA) cream Apply 1 application topically as needed [facility] [place]).     Marland Kitchen LUMIGAN 0.01 % SOLN Place 1 drop into the left eye at bedtime.     Marland Kitchen  LYRICA 50 MG capsule Take 1 capsule (50 mg total) by mouth at bedtime. 90 capsule 3  . Methoxy PEG-Epoetin Beta (MIRCERA IJ) Mircera    . metoCLOPramide (REGLAN) 5 MG tablet Take 1 tablet (5 mg total) by mouth 3 (three)  times daily before meals. 90 tablet 1  . midodrine (PROAMATINE) 10 MG tablet Take 10 mg by mouth as directed. Low bp, and before dialysis    . Multiple Vitamin (MULTIVITAMIN) tablet Take 1 tablet by mouth daily.    . Nutritional Supplements (,FEEDING SUPPLEMENT, PROSOURCE PLUS) liquid Take 30 mLs by mouth 3 (three) times daily between meals. 1000 mL 0  . Nutritional Supplements (FEEDING SUPPLEMENT, NEPRO CARB STEADY,) LIQD Take 237 mLs by mouth 2 (two) times daily between meals. 10000 mL 0  . ondansetron (ZOFRAN) 4 MG tablet Take 1 tablet (4 mg total) by mouth every 8 (eight) hours as needed for nausea. 60 tablet 1  . oxyCODONE (OXY IR/ROXICODONE) 5 MG immediate release tablet Take 1 tablet (5 mg total) by mouth every 4 (four) hours as needed for severe pain. 60 tablet 0  . pantoprazole (PROTONIX) 40 MG tablet Take 1 tablet (40 mg total) by mouth daily as needed (acid reflux/indigestion.). 30 tablet 11  . polyethylene glycol (MIRALAX / GLYCOLAX) 17 g packet Take 17 g by mouth daily.    Marland Kitchen senna-docusate (SENOKOT-S) 8.6-50 MG tablet Take 2 tablets by mouth 2 (two) times daily. 30 tablet 0  . sodium chloride (OCEAN) 0.65 % SOLN nasal spray Place 1 spray into both nostrils 4 (four) times daily as needed for congestion.     No current facility-administered medications for this visit.    PHYSICAL EXAMINATION: ECOG PERFORMANCE STATUS: 2 - Symptomatic, <50% confined to bed  Vitals:   01/19/21 0913  BP: (!) 111/53  Pulse: 80  Resp: 18  Temp: 98.4 F (36.9 C)  SpO2: 100%   Filed Weights   01/19/21 0913  Weight: 105 lb 12.8 oz (48 kg)    GENERAL:alert, no distress and comfortable.  She looks thin and cachectic NEURO: alert & oriented x 3 with fluent speech, no focal motor/sensory deficits  LABORATORY DATA:  I have reviewed the data as listed    Component Value Date/Time   NA 142 01/19/2021 0846   K 3.5 01/19/2021 0846   CL 98 01/19/2021 0846   CO2 32 01/19/2021 0846   GLUCOSE 128 (H)  01/19/2021 0846   BUN 37 (H) 01/19/2021 0846   CREATININE 1.91 (H) 01/19/2021 0846   CREATININE 2.71 (H) 10/20/2019 1128   CREATININE 1.03 (H) 05/11/2016 0931   CALCIUM 8.3 (L) 01/19/2021 0846   PROT 5.4 (L) 01/19/2021 0846   ALBUMIN 3.2 (L) 01/19/2021 0846   AST 25 01/19/2021 0846   AST 18 10/20/2019 1128   ALT 24 01/19/2021 0846   ALT 10 10/20/2019 1128   ALKPHOS 51 01/19/2021 0846   BILITOT 0.6 01/19/2021 0846   BILITOT 0.4 10/20/2019 1128   GFRNONAA 25 (L) 01/19/2021 0846   GFRNONAA 15 (L) 10/20/2019 1128   GFRAA 14 (L) 07/14/2020 1021   GFRAA 18 (L) 10/20/2019 1128    No results found for: SPEP, UPEP  Lab Results  Component Value Date   WBC 6.6 01/19/2021   NEUTROABS 5.1 01/19/2021   HGB 8.3 (L) 01/19/2021   HCT 25.4 (L) 01/19/2021   MCV 98.4 01/19/2021   PLT 171 01/19/2021      Chemistry      Component Value Date/Time  NA 142 01/19/2021 0846   K 3.5 01/19/2021 0846   CL 98 01/19/2021 0846   CO2 32 01/19/2021 0846   BUN 37 (H) 01/19/2021 0846   CREATININE 1.91 (H) 01/19/2021 0846   CREATININE 2.71 (H) 10/20/2019 1128   CREATININE 1.03 (H) 05/11/2016 0931      Component Value Date/Time   CALCIUM 8.3 (L) 01/19/2021 0846   ALKPHOS 51 01/19/2021 0846   AST 25 01/19/2021 0846   AST 18 10/20/2019 1128   ALT 24 01/19/2021 0846   ALT 10 10/20/2019 1128   BILITOT 0.6 01/19/2021 0846   BILITOT 0.4 10/20/2019 1128       RADIOGRAPHIC STUDIES: I have personally reviewed the radiological images as listed and agreed with the findings in the report. DG Chest 2 View  Result Date: 01/12/2021 CLINICAL DATA:  Shortness of breath.  In stage renal disease EXAM: CHEST - 2 VIEW COMPARISON:  January 03, 2021 FINDINGS: There is a persistent left pleural effusion with suspected atelectasis left base. Lungs elsewhere are clear. There is cardiomegaly with pulmonary vascularity normal. Pacemaker leads attached to right atrium and right ventricle. There is aortic atherosclerosis.  No adenopathy. Bones osteoporotic. There is extensive arthropathy in each shoulder. Remodeling in right humeral head may be indicative of underlying avascular necrosis. IMPRESSION: Persistent left pleural effusion with suspected left base atelectasis. Lungs elsewhere clear. Stable cardiac enlargement. Pacemaker leads attached to right atrium and right ventricle. Aortic Atherosclerosis (ICD10-I70.0). Electronically Signed   By: Lowella Grip III M.D.   On: 01/12/2021 11:10   DG CHEST PORT 1 VIEW  Result Date: 01/03/2021 CLINICAL DATA:  Shortness of breath EXAM: PORTABLE CHEST 1 VIEW COMPARISON:  December 30, 2020. FINDINGS: There is ill-defined airspace opacity in the left base with small left pleural effusion. The right lung is clear. No appreciable interstitial edema. There is cardiomegaly with pulmonary vascularity within normal limits. Pacemaker leads attached to right atrium right ventricle. There is aortic atherosclerosis. Bones are osteoporotic. There is arthropathy in each shoulder. Skin fold noted on the right. IMPRESSION: Ill-defined opacity concerning for pneumonia left base with small left pleural effusion. Right lung clear. Cardiomegaly with pacemaker leads attached to right atrium and right ventricle, stable. Aortic Atherosclerosis (ICD10-I70.0). Electronically Signed   By: Lowella Grip III M.D.   On: 01/03/2021 08:03   DG Chest Portable 1 View  Result Date: 12/30/2020 CLINICAL DATA:  Shortness of breath. EXAM: PORTABLE CHEST 1 VIEW COMPARISON:  September 11, 2020. FINDINGS: Stable cardiomediastinal silhouette. Left-sided pacemaker is unchanged in position. No pneumothorax is noted. Mild bibasilar atelectasis is noted with probable small pleural effusions. Bony thorax is unremarkable. IMPRESSION: Mild bibasilar subsegmental atelectasis with probable small pleural effusions. Aortic Atherosclerosis (ICD10-I70.0). Electronically Signed   By: Marijo Conception M.D.   On: 12/30/2020 15:56    ECHOCARDIOGRAM COMPLETE  Result Date: 01/02/2021    ECHOCARDIOGRAM REPORT   Patient Name:   MAKALYA NAVE Date of Exam: 01/01/2021 Medical Rec #:  [id number]        Height:       65.0 in Accession #:    [id number]       Weight:       103.6 lb Date of Birth:  09-17-1930       BSA:          1.496 m Patient Age:    47 years         BP:           104/55  mmHg Patient Gender: F                HR:           78 bpm. Exam Location:  Inpatient Procedure: 2D Echo, Cardiac Doppler and Color Doppler Indications:     I50.9* Heart failure (unspecified)  History:         Patient has prior history of Echocardiogram examinations, most                  recent 04/02/2020. Pacemaker; Arrythmias:Atrial Flutter. H/O                  Breast cancer.  Sonographer:     [name] Referring Phys:  [id number] [facility] Gasper Sells Diagnosing Phys: Ena Dawley MD IMPRESSIONS  1. When compared to the prior study from 04/02/2021 LVEF is significantly worse with diffuse hypokinesis and severe dyssynchrony secondary to RV pacing. LVEF 25-30%, previously 40-45%.  2. Left ventricular ejection fraction, by estimation, is 25 to 30%. The left ventricle has severely decreased function. The left ventricle demonstrates global hypokinesis. There is moderate concentric left ventricular hypertrophy. Left ventricular diastolic parameters are consistent with Grade II diastolic dysfunction (pseudonormalization). Elevated left atrial pressure.  3. Right ventricular systolic function is normal. The right ventricular size is normal. There is severely elevated pulmonary artery systolic pressure. The estimated right ventricular systolic pressure is 03.2 mmHg.  4. Left atrial size was moderately dilated.  5. Right atrial size was mildly dilated.  6. A small pericardial effusion is present. The pericardial effusion is circumferential. Large pleural effusion in the left lateral region.  7. The mitral valve is normal in structure. Moderate mitral valve  regurgitation. No evidence of mitral stenosis.  8. Tricuspid valve regurgitation is severe.  9. The aortic valve is normal in structure. Aortic valve regurgitation is not visualized. Mild to moderate aortic valve sclerosis/calcification is present, without any evidence of aortic stenosis. Aortic valve mean gradient measures 8.0 mmHg. 10. Mild pulmonic stenosis. 11. The inferior vena cava is dilated in size with <50% respiratory variability, suggesting right atrial pressure of 15 mmHg. FINDINGS  Left Ventricle: Left ventricular ejection fraction, by estimation, is 25 to 30%. The left ventricle has severely decreased function. The left ventricle demonstrates global hypokinesis. The left ventricular internal cavity size was normal in size. There is moderate concentric left ventricular hypertrophy. Abnormal (paradoxical) septal motion, consistent with RV pacemaker. Left ventricular diastolic parameters are consistent with Grade II diastolic dysfunction (pseudonormalization). Elevated left atrial pressure. Right Ventricle: The right ventricular size is normal. No increase in right ventricular wall thickness. Right ventricular systolic function is normal. There is severely elevated pulmonary artery systolic pressure. The tricuspid regurgitant velocity is 3.54 m/s, and with an assumed right atrial pressure of 15 mmHg, the estimated right ventricular systolic pressure is 12.2 mmHg. Left Atrium: Left atrial size was moderately dilated. Right Atrium: Right atrial size was mildly dilated. Pericardium: A small pericardial effusion is present. The pericardial effusion is circumferential. Mitral Valve: The mitral valve is normal in structure. Moderate mitral valve regurgitation. No evidence of mitral valve stenosis. Tricuspid Valve: The tricuspid valve is normal in structure. Tricuspid valve regurgitation is severe. No evidence of tricuspid stenosis. Aortic Valve: The aortic valve is normal in structure. Aortic valve  regurgitation is not visualized. Mild to moderate aortic valve sclerosis/calcification is present, without any evidence of aortic stenosis. Aortic valve mean gradient measures 8.0 mmHg. Aortic valve peak gradient measures 14.6 mmHg. Aortic valve area,  by VTI measures 1.24 cm. Pulmonic Valve: The pulmonic valve was normal in structure. Pulmonic valve regurgitation is not visualized. Mild pulmonic stenosis. Aorta: The aortic root is normal in size and structure. Venous: The inferior vena cava is dilated in size with less than 50% respiratory variability, suggesting right atrial pressure of 15 mmHg. IAS/Shunts: No atrial level shunt detected by color flow Doppler. Additional Comments: A device lead is visualized in the right ventricle and right atrium. There is a large pleural effusion in the left lateral region.  LEFT VENTRICLE PLAX 2D LVIDd:         4.60 cm      Diastology LVIDs:         4.00 cm      LV e' lateral: 7.07 cm/s LV PW:         1.40 cm LV IVS:        1.40 cm LVOT diam:     2.00 cm LV SV:         43 LV SV Index:   29 LVOT Area:     3.14 cm  LV Volumes (MOD) LV vol d, MOD A2C: 104.0 ml LV vol d, MOD A4C: 148.0 ml LV vol s, MOD A2C: 68.6 ml LV vol s, MOD A4C: 135.0 ml LV SV MOD A2C:     35.4 ml LV SV MOD A4C:     148.0 ml LV SV MOD BP:      24.5 ml RIGHT VENTRICLE            IVC RV Basal diam:  3.70 cm    IVC diam: 1.90 cm RV S prime:     8.81 cm/s TAPSE (M-mode): 1.4 cm LEFT ATRIUM              Index       RIGHT ATRIUM           Index LA diam:        3.90 cm  2.61 cm/m  RA Area:     25.30 cm LA Vol (A2C):   111.0 ml 74.21 ml/m RA Volume:   76.70 ml  51.28 ml/m LA Vol (A4C):   78.5 ml  52.48 ml/m LA Biplane Vol: 96.2 ml  64.31 ml/m  AORTIC VALVE AV Area (Vmax):    1.29 cm AV Area (Vmean):   1.27 cm AV Area (VTI):     1.24 cm AV Vmax:           191.00 cm/s AV Vmean:          128.000 cm/s AV VTI:            0.347 m AV Peak Grad:      14.6 mmHg AV Mean Grad:      8.0 mmHg LVOT Vmax:         78.30  cm/s LVOT Vmean:        51.800 cm/s LVOT VTI:          0.137 m LVOT/AV VTI ratio: 0.39  AORTA Ao Root diam: 3.30 cm Ao Asc diam:  2.80 cm TRICUSPID VALVE TR Peak grad:   50.1 mmHg TR Vmax:        354.00 cm/s  SHUNTS Systemic VTI:  0.14 m Systemic Diam: 2.00 cm Ena Dawley MD Electronically signed by Ena Dawley MD Signature Date/Time: 01/02/2021/12:21:23 PM    Final (Updated)    ECHO TEE  Result Date: 01/07/2021    TRANSESOPHOGEAL ECHO REPORT   Patient Name:   [facility] [place] Date of Exam: 01/07/2021 Medical Rec #:  [id number]        Height:       65.0 in Accession #:    [id number]       Weight:       113.8 lb Date of Birth:  Feb 21, 1930       BSA:          1.556 m Patient Age:    59 years         BP:           135/46 mmHg Patient Gender: F                HR:           71 bpm. Exam Location:  Inpatient Procedure: Transesophageal Echo, Color Doppler and Limited Color Doppler Indications:     atrial fibrillation  History:         Patient has prior history of Echocardiogram examinations, most                  recent 01/01/2021. End stage renal disease, Arrythmias:Atrial                  Fibrillation; Risk Factors:Diabetes and Sleep Apnea.  Sonographer:     Johny Chess Referring Phys:  [phone] TRACI R TURNER Diagnosing Phys: Fransico Him MD PROCEDURE: After discussion of the risks and benefits of a TEE, an informed consent was obtained from the patient. The transesophogeal probe was passed without difficulty through the esophogus of the patient. Sedation performed by different physician. The patient was monitored while under deep sedation. Anesthestetic sedation was provided intravenously by Anesthesiology: 61m of Propofol, 455mof Lidocaine. The patient's vital signs; including heart rate, blood pressure, and oxygen saturation; remained stable throughout the procedure. The patient developed no complications during the procedure. A direct current cardioversion was performed. IMPRESSIONS  1. Left ventricular  ejection fraction, by estimation, is 25 to 30%. The left ventricle has severely decreased function. The left ventricle demonstrates global hypokinesis. The left ventricular internal cavity size was moderately dilated.  2. Right ventricular systolic function is normal. The right ventricular size is normal.  3. Left atrial size was moderately dilated. No left atrial/left atrial appendage thrombus was detected.  4. Right atrial size was mildly dilated.  5. The mitral valve is degenerative in appearance. There is mild thickening of the mitral valve leaflet(s). Reduced leaflet mobility of the posterior mitral valve leaflet of the mitral valve leaflets. No evidence of mitral valve regurgitation. No evidence of mitral valve stenosis.  6. The aortic valve is normal in structure. Aortic valve regurgitation is not visualized. No aortic stenosis is present.  7. The inferior vena cava is normal in size with greater than 50% respiratory variability, suggesting right atrial pressure of 3 mmHg.  8. Agitated saline contrast bubble study was negative, with no evidence of any interatrial shunt.  9. There is mild (Grade II) layered plaque involving the ascending aorta and descending aorta. Conclusion(s)/Recommendation(s): Normal biventricular function without evidence of hemodynamically significant valvular heart disease. No LA/LAA thrombus identified. Successful cardioversion performed with restoration of normal sinus rhythm. FINDINGS  Left Ventricle: Left ventricular ejection fraction, by estimation, is 25 to 30%. The left ventricle has severely decreased function. The left ventricle demonstrates global hypokinesis. The left ventricular internal cavity size was moderately dilated. There is no left ventricular hypertrophy. Right Ventricle: The right ventricular size is normal. No increase in right ventricular wall thickness. Right  ventricular systolic function is normal. Left Atrium: Left atrial size was moderately dilated.  Spontaneous echo contrast was present. No left atrial/left atrial appendage thrombus was detected. Right Atrium: Right atrial size was mildly dilated. Pericardium: There is no evidence of pericardial effusion. Mitral Valve: The mitral valve is degenerative in appearance. There is mild thickening of the mitral valve leaflet(s). Reduced leaflet mobility of the posterior mitral valve leaflet of the mitral valve leaflets. No evidence of mitral valve regurgitation.  No evidence of mitral valve stenosis. Tricuspid Valve: The tricuspid valve is normal in structure. Tricuspid valve regurgitation is mild . No evidence of tricuspid stenosis. Aortic Valve: The aortic valve is normal in structure. Aortic valve regurgitation is not visualized. No aortic stenosis is present. Pulmonic Valve: The pulmonic valve was normal in structure. Pulmonic valve regurgitation is mild. No evidence of pulmonic stenosis. Aorta: The aortic root is normal in size and structure. There is mild (Grade II) layered plaque involving the ascending aorta and descending aorta. Venous: The inferior vena cava is normal in size with greater than 50% respiratory variability, suggesting right atrial pressure of 3 mmHg. IAS/Shunts: No atrial level shunt detected by color flow Doppler. Agitated saline contrast was given intravenously to evaluate for intracardiac shunting. Agitated saline contrast bubble study was negative, with no evidence of any interatrial shunt. There  is no evidence of a patent foramen ovale.  MR Peak grad:    79.2 mmHg MR Mean grad:    43.0 mmHg MR Vmax:         445.00 cm/s MR Vmean:        298.0 cm/s MR PISA:         1.57 cm MR PISA Eff ROA: 14 mm MR PISA Radius:  0.50 cm Fransico Him MD Electronically signed by Fransico Him MD Signature Date/Time: 01/07/2021/1:43:55 PM    Final    CUP PACEART INCLINIC DEVICE CHECK  Result Date: 12/29/2020 Patient seen in device clinic today to attempt ANIPS. Was performed by Dr. Rayann Heman, patient  initially was AFL and converted to AF. Would like patient to send manual transmission 12/30/20 to assess presenting. Patient will be scheduled cardioversion.Lavenia Atlas, BSN, RN

## 2021-01-19 NOTE — Assessment & Plan Note (Signed)
She has multifactorial anemia I would defer to nephrologist for management She does not need blood transfusion support today

## 2021-01-19 NOTE — Assessment & Plan Note (Signed)
Recent to myeloma panel was stable She has been off treatment for several months I will space out her appointment to every 3 months Continue supportive care I recommend weaning her off dexamethasone

## 2021-01-19 NOTE — Assessment & Plan Note (Signed)
She has multifactorial bone pain She is not a candidate for aggressive management given her age and comorbidities I recommend increased dose of vitamin D supplement She has been prescribed oxycodone and that seems to help We discussed narcotic refill policy

## 2021-01-19 NOTE — Progress Notes (Signed)
Primary Care Physician: Lucianne Lei, MD Primary Cardiologist: Dr Johnsie Cancel Primary Electrophysiologist: Dr Rayann Heman Referring Physician: Device clinic   [name] is a 87 y.o. female with a history of SSS s/p PPM, DM, HTN, ESRD, multiple myeloma, OSA, atrial tachycardia, and atrial fibrillation who presents for follow up in the [facility]. The patient was initially diagnosed with atrial fibrillation 09/2020 on a remote device check. She was not aware of her arrhythmia although she does have intermittent SOB. Patient has a CHADS2VASC score of 6.  On follow up today, patient was hospitalized 3/17-3/25/22 with acute CHF. She was seen by Dr Rayann Heman on 12/29/20 who attempted NIPS to restore SR which was unsuccessful. DCCV was planned but she had increasing SOB and presented to the ED the next day. Echo showed her EF had declined to 25-30%. She underwent DCCV on 01/07/21. She remains in A paced rhythm today. She does report her breathing is a little better.   Today, she denies symptoms of palpitations, chest pain, orthopnea, PND, lower extremity edema, dizziness, presyncope, syncope, bleeding, or neurologic sequela. The patient is tolerating medications without difficulties and is otherwise without complaint today.    Atrial Fibrillation Risk Factors:  she does have symptoms or diagnosis of sleep apnea. she is compliant with CPAP therapy. she does not have a history of rheumatic fever.   she has a BMI of Body mass index is 18.07 kg/m.Marland Kitchen Filed Weights   01/19/21 1436  Weight: 49.3 kg    Family History  Problem Relation Age of Onset  . Congestive Heart Failure Mother   . Tuberculosis Mother   . Multiple myeloma Mother   . Bone cancer Father   . Arthritis Father   . Breast cancer Sister   . Colon cancer Sister   . Hypertension Sister   . Dementia Sister   . Alcohol abuse Son      Atrial Fibrillation Management history:  Previous antiarrhythmic  drugs: none Previous cardioversions: 01/07/21 Previous ablations: none CHADS2VASC score: 6 Anticoagulation history: Eliquis   Past Medical History:  Diagnosis Date  . Anemia   . Arthritis   . Benign paroxysmal positional vertigo 04/06/2010  . Benign positional vertigo   . 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 CAROTID BRUIT 02/24/2008  . Complication of anesthesia    took a long time to wake up with knee replacement  . DIABETES MELLITUS, TYPE II 02/24/2008  . ESRD (end stage renal disease) on dialysis ([place])    tues thurs sat nw kidney center sees [place] [facility]  . GERD (gastroesophageal reflux disease)   . Glaucoma   . Gout   . HYPERTENSION 02/24/2008  . Multiple myeloma ([place])   . Obstructive sleep apnea   . Pancytopenia ([place])   . Sick sinus syndrome ([place])   . Wears dentures   . Wears glasses    Past Surgical History:  Procedure Laterality Date  . A/V FISTULAGRAM Right 03/08/2020   Procedure: A/V FISTULAGRAM- Right Arm;  Surgeon: Waynetta Sandy, MD;  Location: [place] [facility];  Service: Cardiovascular;  Laterality: Right;  . A/V FISTULAGRAM N/A 06/28/2020   Procedure: A/V FISTULAGRAM - Right Upper Arm;  Surgeon: Waynetta Sandy, MD;  Location: [place] [facility];  Service: Cardiovascular;  Laterality: N/A;  . ABDOMINAL HYSTERECTOMY     1980's  . AV FISTULA PLACEMENT Right 03/11/2019   Procedure: CREATION RIGHT ARM RADIOCEPHALIC ARTERIOVENOUS FISTULA;  Surgeon: Angelia Mould, MD;  Location:  [facility];  Service: Vascular;  Laterality: Right;  . AV FISTULA PLACEMENT Right 03/25/2020   Procedure: right arm ARTERIOVENOUS (AV) FISTULA CREATION;  Surgeon: Waynetta Sandy, MD;  Location: [place];  Service: Vascular;  Laterality: Right;  . BACK SURGERY     x 3  . BIOPSY  06/24/2019   Procedure: BIOPSY;  Surgeon: Ronald Lobo, MD;  Location: [facility];  Service: Endoscopy;;  . BUBBLE STUDY  01/07/2021   Procedure: BUBBLE STUDY;  Surgeon: Sueanne Margarita, MD;  Location: [place];  Service: Cardiovascular;;  . CARDIOVERSION N/A 01/07/2021   Procedure: CARDIOVERSION;  Surgeon: Sueanne Margarita, MD;  Location: [facility];  Service: Cardiovascular;  Laterality: N/A;  . EP IMPLANTABLE DEVICE N/A 05/16/2016   Procedure: PPM Generator Changeout;  Surgeon: Thompson Grayer, MD;  Location: [place] [facility];  Service: Cardiovascular;  Laterality: N/A;  . ESOPHAGOGASTRODUODENOSCOPY (EGD) WITH PROPOFOL N/A 06/24/2019   Procedure: ESOPHAGOGASTRODUODENOSCOPY (EGD) WITH PROPOFOL;  Surgeon: Ronald Lobo, MD;  Location: [facility];  Service: Endoscopy;  Laterality: N/A;  . FISTULA SUPERFICIALIZATION Right 03/11/2019   Procedure: FISTULA SUPERFICIALIZATION;  Surgeon: Angelia Mould, MD;  Location: [place];  Service: Vascular;  Laterality: Right;  . INSERTION OF DIALYSIS CATHETER Right 03/25/2020   Procedure: INSERTION OF DIALYSIS CATHETER, right internal jugular;  Surgeon: Waynetta Sandy, MD;  Location: [place];  Service: Vascular;  Laterality: Right;  . IR FLUORO GUIDE CV LINE RIGHT  03/07/2019  . IR US GUIDE VASC ACCESS RIGHT  03/07/2019  . KNEE SURGERY Right    2003  . KNEE SURGERY Left    2011  . PACEMAKER INSERTION    . PERIPHERAL VASCULAR BALLOON ANGIOPLASTY Right 06/28/2020   Procedure: PERIPHERAL VASCULAR BALLOON ANGIOPLASTY;  Surgeon: Waynetta Sandy, MD;  Location: [place] [facility];  Service: Cardiovascular;  Laterality: Right;  . TEE WITHOUT CARDIOVERSION N/A 01/07/2021   Procedure: TRANSESOPHAGEAL ECHOCARDIOGRAM (TEE);  Surgeon: Sueanne Margarita, MD;  Location: [facility];  Service: Cardiovascular;  Laterality: N/A;    Current Outpatient Medications  Medication Sig Dispense Refill  . acetaminophen (TYLENOL) 500 MG tablet Take 1,000 mg by mouth 2 (two) times daily as needed for mild pain or moderate pain.     Marland Kitchen acyclovir (ZOVIRAX) 400 MG tablet Take 1 tablet (400 mg total) by mouth daily. 90 tablet 6  .  allopurinol (ZYLOPRIM) 100 MG tablet Take 0.5 tablets (50 mg total) by mouth daily. 30 tablet 0  . apixaban (ELIQUIS) 2.5 MG TABS tablet Take 1 tablet (2.5 mg total) by mouth 2 (two) times daily. 60 tablet 3  . cholecalciferol (VITAMIN D3) 25 MCG (1000 UT) tablet Take 2,000 Units by mouth daily.    . dorzolamide (TRUSOPT) 2 % ophthalmic solution Place 1 drop into both eyes 2 (two) times daily.  1  . fluorometholone (FML) 0.1 % ophthalmic suspension Place 1 drop into the right eye daily.     Marland Kitchen glimepiride (AMARYL) 1 MG tablet Take 0.5 mg by mouth daily.     Marland Kitchen lactulose (CHRONULAC) 10 GM/15ML solution Take 15 mLs by mouth 3 (three) times daily.    Marland Kitchen lidocaine-prilocaine (EMLA) cream Apply 1 application topically as needed [facility]).     Marland Kitchen LUMIGAN 0.01 % SOLN Place 1 drop into both eyes at bedtime.    Marland Kitchen LYRICA 50 MG capsule Take 1 capsule (50 mg total) by mouth at bedtime. 90 capsule 3  . Methoxy PEG-Epoetin Beta (MIRCERA IJ) Mircera    .  metoCLOPramide (REGLAN) 5 MG tablet Take 1 tablet (5 mg total) by mouth 3 (three) times daily before meals. 90 tablet 1  . midodrine (PROAMATINE) 10 MG tablet Take 10 mg by mouth as directed. Low bp, and before dialysis    . Multiple Vitamin (MULTIVITAMIN) tablet Take 1 tablet by mouth daily.    . Nutritional Supplements (,FEEDING SUPPLEMENT, PROSOURCE PLUS) liquid Take 30 mLs by mouth 3 (three) times daily between meals. 1000 mL 0  . Nutritional Supplements (FEEDING SUPPLEMENT, NEPRO CARB STEADY,) LIQD Take 237 mLs by mouth 2 (two) times daily between meals. 10000 mL 0  . ondansetron (ZOFRAN) 4 MG tablet Take 1 tablet (4 mg total) by mouth every 8 (eight) hours as needed for nausea. 60 tablet 1  . oxyCODONE (OXY IR/ROXICODONE) 5 MG immediate release tablet Take 1 tablet (5 mg total) by mouth every 4 (four) hours as needed for severe pain. 60 tablet 0  . pantoprazole (PROTONIX) 40 MG tablet Take 1 tablet (40 mg total) by mouth daily as needed (acid reflux/indigestion.).  30 tablet 11  . polyethylene glycol (MIRALAX / GLYCOLAX) 17 g packet Take 17 g by mouth daily.    Marland Kitchen senna-docusate (SENOKOT-S) 8.6-50 MG tablet Take 2 tablets by mouth 2 (two) times daily. 30 tablet 0  . sodium chloride (OCEAN) 0.65 % SOLN nasal spray Place 1 spray into both nostrils 4 (four) times daily as needed for congestion.     No current facility-administered medications for this encounter.    Allergies  Allergen Reactions  . Aspirin Other (See Comments)    REACTION: STOMACH ISSUES WITH DOSE HIGHER THAN 81 MG  Other reaction(s): GI Upset (intolerance), Other (See Comments) REACTION: STOMACH ISSUES WITH DOSE HIGHER THAN 81 MG  REACTION: STOMACH ISSUES WITH DOSE HIGHER THAN 81 MG   . Hydrocodone-Acetaminophen Nausea And Vomiting  . Morphine Nausea And Vomiting  . Penicillins Rash    Patient took injection and tablets, had a reaction. She has taken amoxicillin with no reaction Has patient had a PCN reaction causing immediate rash, facial/tongue/throat swelling, SOB or lightheadedness with hypotension: Yes Has patient had a PCN reaction causing severe rash involving mucus membranes or skin necrosis: Yes Has patient had a PCN reaction that required hospitalization No Has patient had a PCN reaction occurring within the last 10 years: No If all of the above answers are "NO", then m Patient took injection and tablets, had a reaction. She has taken amoxicillin with no reaction Patient took injection and tablets, had a reaction. She has taken amoxicillin with no reaction Has patient had a PCN reaction causing immediate rash, facial/tongue/throat swelling, SOB or lightheadedness with hypotension: Yes Has patient had a PCN reaction causing severe rash involving mucus membranes or skin necrosis: Yes Has patient had a PCN reaction that required hospitalization No Has patient had a PCN reaction occurring within the last 10 years: No If all of the above answers are "NO", then m  . Betaine  Nausea And Vomiting  . Dacarbazine Nausea And Vomiting  . Ketorolac Tromethamine Itching  . Brimonidine Itching  . Diltiazem Hcl Rash  . Hydrocodone-Acetaminophen Nausea And Vomiting  . Neurontin [Gabapentin] Nausea And Vomiting  . Sulfacetamide Sodium Rash    Social History   Socioeconomic History  . Marital status: Married    Spouse name: Not on file  . Number of children: 5  . Years of education: Not on file  . Highest education level: Not on file  Occupational History  .  Occupation: retired  Tobacco Use  . Smoking status: Never Smoker  . Smokeless tobacco: Never Used  Vaping Use  . Vaping Use: Never used  Substance and Sexual Activity  . Alcohol use: No  . Drug use: No  . Sexual activity: Not on file  Other Topics Concern  . Not on file  Social History Narrative   Retired Marine scientist   Social Determinants of Radio broadcast assistant Strain: Not on file  Food Insecurity: Not on file  Transportation Needs: Not on file  Physical Activity: Not on file  Stress: Not on file  Social Connections: Not on file  Intimate Partner Violence: Not on file     ROS- All systems are reviewed and negative except as per the HPI above.  Physical Exam: Vitals:   01/19/21 1436  BP: (!) 124/50  Pulse: 72  Weight: 49.3 kg  Height: 5' 5" (1.651 m)    GEN- The patient is a cachetic appearing elderly female, alert and oriented x 3 today.   HEENT-head normocephalic, atraumatic, sclera clear, conjunctiva pink, hearing intact, trachea midline. Lungs- Clear to ausculation bilaterally, normal work of breathing Heart- Regular rate and rhythm, no murmurs, rubs or gallops  GI- soft, NT, ND, + BS Extremities- no clubbing, cyanosis, or edema MS- no significant deformity or atrophy Skin- no rash or lesion Psych- euthymic mood, full affect Neuro- strength and sensation are intact   Wt Readings from Last 3 Encounters:  01/19/21 49.3 kg  01/19/21 48 kg  01/12/21 51.3 kg    EKG today  demonstrates A paced rhythm, LBBB Vent. rate 72 BPM PR interval 232 ms QRS duration 142 ms QT/QTcB 470/514 ms   Echo 01/01/21 demonstrated  1. When compared to the prior study from 04/02/2021 LVEF is significantly  worse with diffuse hypokinesis and severe dyssynchrony secondary to RV  pacing. LVEF 25-30%, previously 40-45%.  2. Left ventricular ejection fraction, by estimation, is 25 to 30%. The  left ventricle has severely decreased function. The left ventricle  demonstrates global hypokinesis. There is moderate concentric left  ventricular hypertrophy. Left ventricular  diastolic parameters are consistent with Grade II diastolic dysfunction  (pseudonormalization). Elevated left atrial pressure.  3. Right ventricular systolic function is normal. The right ventricular  size is normal. There is severely elevated pulmonary artery systolic  pressure. The estimated right ventricular systolic pressure is 37.3 mmHg.  4. Left atrial size was moderately dilated.  5. Right atrial size was mildly dilated.  6. A small pericardial effusion is present. The pericardial effusion is  circumferential. Large pleural effusion in the left lateral region.  7. The mitral valve is normal in structure. Moderate mitral valve  regurgitation. No evidence of mitral stenosis.  8. Tricuspid valve regurgitation is severe.  9. The aortic valve is normal in structure. Aortic valve regurgitation is  not visualized. Mild to moderate aortic valve sclerosis/calcification is  present, without any evidence of aortic stenosis. Aortic valve mean  gradient measures 8.0 mmHg.  10. Mild pulmonic stenosis.  11. The inferior vena cava is dilated in size with <50% respiratory  variability, suggesting right atrial pressure of 15 mmHg.   Epic records are reviewed at length today  CHA2DS2-VASc Score = 6  The patient's score is based upon: CHF History: Yes HTN History: Yes Diabetes History: Yes Stroke History:  No Vascular Disease History: No Age Score: 2 Gender Score: 1      ASSESSMENT AND PLAN: 1. Persistent Atrial Fibrillation/atach/atrial flutter The patient's CHA2DS2-VASc score  is 6, indicating a 9.7% annual risk of stroke.   S/p TEE/DCCV on 01/07/21 Patient maintaining SR for now. If she has early return of her arrhythmia, amiodarone would be her only AAD option. Would favor rhythm control over rate control given her symptomatic improvement and reduced EF.  Continue Eliquis 2.5 mg BID (age, weight, Cr >1.5).  2. Secondary Hypercoagulable State (ICD10:  D68.69) The patient is at significant risk for stroke/thromboembolism based upon her CHA2DS2-VASc Score of 6.  Continue Apixaban (Eliquis).   3. SSS/2nd degree AV block S/p PPM, followed by Dr Rayann Heman and the device clinic.  4. Obstructive sleep apnea Patient reports compliance with CPAP therapy.  5. ESRD HD TTS  6. Chronic systolic CHF EF 37-85%, ? Related to #1 No signs or symptoms of fluid overload today. Guideline directed therapy limited by hypotension and ESRD.   Follow up with Dr Rayann Heman in one month.    [facility] [address] [employer], [place] [address] [phone] 01/19/2021 4:38 PM

## 2021-01-19 NOTE — Assessment & Plan Note (Signed)
We discussed the limitations of treatment options due to her end-stage renal disease °She will continue hemodialysis °

## 2021-01-20 LAB — KAPPA/LAMBDA LIGHT CHAINS
Kappa free light chain: 26 mg/L — ABNORMAL HIGH (ref 3.3–19.4)
Kappa, lambda light chain ratio: 1.7 — ABNORMAL HIGH (ref 0.26–1.65)
Lambda free light chains: 15.3 mg/L (ref 5.7–26.3)

## 2021-01-21 LAB — MULTIPLE MYELOMA PANEL, SERUM
Albumin SerPl Elph-Mcnc: 3.1 g/dL (ref 2.9–4.4)
Albumin/Glob SerPl: 1.6 (ref 0.7–1.7)
Alpha 1: 0.2 g/dL (ref 0.0–0.4)
Alpha2 Glob SerPl Elph-Mcnc: 0.7 g/dL (ref 0.4–1.0)
B-Globulin SerPl Elph-Mcnc: 0.8 g/dL (ref 0.7–1.3)
Gamma Glob SerPl Elph-Mcnc: 0.2 g/dL — ABNORMAL LOW (ref 0.4–1.8)
Globulin, Total: 2 g/dL — ABNORMAL LOW (ref 2.2–3.9)
IgA: 25 mg/dL — ABNORMAL LOW (ref 64–422)
IgG (Immunoglobin G), Serum: 507 mg/dL — ABNORMAL LOW (ref 586–1602)
IgM (Immunoglobulin M), Srm: 9 mg/dL — ABNORMAL LOW (ref 26–217)
M Protein SerPl Elph-Mcnc: 0.3 g/dL — ABNORMAL HIGH
Total Protein ELP: 5.1 g/dL — ABNORMAL LOW (ref 6.0–8.5)

## 2021-01-24 ENCOUNTER — Telehealth: Payer: Self-pay

## 2021-01-24 NOTE — Telephone Encounter (Signed)
Called and given below message to son. He verbalized understanding . ?

## 2021-01-24 NOTE — Telephone Encounter (Signed)
-----   Message from Heath Lark, MD sent at 01/24/2021  9:41 AM EDT ----- Pls let son know myeloma panel is stable

## 2021-01-31 ENCOUNTER — Other Ambulatory Visit (HOSPITAL_COMMUNITY): Payer: Self-pay | Admitting: *Deleted

## 2021-01-31 MED ORDER — APIXABAN 2.5 MG PO TABS: 2.5000 mg | ORAL_TABLET | Freq: Two times a day (BID) | ORAL | 2 refills | Status: DC

## 2021-02-01 ENCOUNTER — Other Ambulatory Visit (HOSPITAL_COMMUNITY): Payer: Self-pay

## 2021-02-01 MED ORDER — APIXABAN 2.5 MG PO TABS: 2.5000 mg | ORAL_TABLET | Freq: Two times a day (BID) | ORAL | 2 refills | Status: DC

## 2021-02-04 ENCOUNTER — Telehealth: Payer: Self-pay

## 2021-02-04 ENCOUNTER — Ambulatory Visit (INDEPENDENT_AMBULATORY_CARE_PROVIDER_SITE_OTHER): Payer: Medicare PPO

## 2021-02-04 ENCOUNTER — Other Ambulatory Visit: Payer: Self-pay | Admitting: Hematology and Oncology

## 2021-02-04 DIAGNOSIS — I495 Sick sinus syndrome: Secondary | ICD-10-CM

## 2021-02-04 MED ORDER — OXYCODONE HCL 5 MG PO TABS: 5.0000 mg | ORAL_TABLET | ORAL | 0 refills | Status: DC | PRN

## 2021-02-04 NOTE — Telephone Encounter (Signed)
Called back and told RX sent to pharmacy by Dr. Lorenso Courier. [name] verbalized understanding.

## 2021-02-04 NOTE — Telephone Encounter (Signed)
Son called and requested a refill on the oxycodone Rx.

## 2021-02-05 LAB — CUP PACEART REMOTE DEVICE CHECK
Battery Remaining Longevity: 82 mo
Battery Remaining Percentage: 89 %
Battery Voltage: 2.95 V
Brady Statistic AP VP Percent: 43 %
Brady Statistic AP VS Percent: 31 %
Brady Statistic AS VP Percent: 7 %
Brady Statistic AS VS Percent: 18 %
Brady Statistic RA Percent Paced: 71 %
Brady Statistic RV Percent Paced: 49 %
Date Time Interrogation Session: 20220422021043
Implantable Lead Implant Date: 20100422
Implantable Lead Implant Date: 20100422
Implantable Lead Location: 753859
Implantable Lead Location: 753860
Implantable Pulse Generator Implant Date: 20170801
Lead Channel Impedance Value: 360 Ohm
Lead Channel Impedance Value: 380 Ohm
Lead Channel Pacing Threshold Amplitude: 0.5 V
Lead Channel Pacing Threshold Amplitude: 0.75 V
Lead Channel Pacing Threshold Pulse Width: 0.5 ms
Lead Channel Pacing Threshold Pulse Width: 0.5 ms
Lead Channel Sensing Intrinsic Amplitude: 2.6 mV
Lead Channel Sensing Intrinsic Amplitude: 8.3 mV
Lead Channel Setting Pacing Amplitude: 2 V
Lead Channel Setting Pacing Amplitude: 2.5 V
Lead Channel Setting Pacing Pulse Width: 0.5 ms
Lead Channel Setting Sensing Sensitivity: 2 mV
Pulse Gen Model: 2272
Pulse Gen Serial Number: 7929552

## 2021-02-17 ENCOUNTER — Other Ambulatory Visit: Payer: Self-pay | Admitting: Hematology and Oncology

## 2021-02-22 ENCOUNTER — Telehealth: Payer: Self-pay

## 2021-02-22 MED ORDER — DEXAMETHASONE 1 MG PO TABS: 1.0000 mg | ORAL_TABLET | Freq: Two times a day (BID) | ORAL | 1 refills | Status: DC

## 2021-02-22 NOTE — Telephone Encounter (Signed)
We should stick with what works I support the decision to reduce to 1 mg next week Please send 30 days supply with 1 refill to her pharmacy

## 2021-02-22 NOTE — Telephone Encounter (Signed)
Patient's son called with questions regarding medication management.   Patient is currently taking dexamethasone 2 mg tablet to help increase appetite. Son reports patient is eating well. Son's plan is to drop dose down to  dexamethasone 1 mg next week.  Will need refill if the plan is to continue dexamethasone.  Son inquiring to see if MD would prefer to continue steroid, or change to an appetite stimulant.    Will forward to MD for recommendations.

## 2021-02-22 NOTE — Telephone Encounter (Signed)
Patient's son notified, verbalized understanding and agreement.  Rx sent to pharmacy per MD recommendations.

## 2021-02-22 NOTE — Addendum Note (Signed)
Addended by: Rennis Harding on: 02/22/2021 09:40 AM   Modules accepted: Orders

## 2021-02-23 NOTE — Progress Notes (Signed)
Remote pacemaker transmission.   

## 2021-02-25 ENCOUNTER — Other Ambulatory Visit: Payer: Self-pay | Admitting: Hematology and Oncology

## 2021-02-25 ENCOUNTER — Encounter: Payer: Self-pay | Admitting: Hematology and Oncology

## 2021-02-25 MED ORDER — OXYCODONE HCL 5 MG PO TABS: 5.0000 mg | ORAL_TABLET | ORAL | 0 refills | Status: DC | PRN

## 2021-02-25 NOTE — Telephone Encounter (Signed)
Pts son requested a refill of Oxycodone. Dr. Alvy Bimler has sent this refill and request for pt and son to be reminded to call in narcotic refills earlier in the week so as to avoid running out over the weekend. I have left a detailed message with this information.

## 2021-02-28 ENCOUNTER — Other Ambulatory Visit: Payer: Self-pay

## 2021-02-28 ENCOUNTER — Telehealth: Payer: Self-pay

## 2021-02-28 ENCOUNTER — Telehealth (INDEPENDENT_AMBULATORY_CARE_PROVIDER_SITE_OTHER): Payer: Medicare PPO | Admitting: Internal Medicine

## 2021-02-28 VITALS — BP 128/61 | HR 71 | Ht 65.0 in | Wt 110.0 lb

## 2021-02-28 DIAGNOSIS — I471 Supraventricular tachycardia: Secondary | ICD-10-CM

## 2021-02-28 DIAGNOSIS — I495 Sick sinus syndrome: Secondary | ICD-10-CM

## 2021-02-28 DIAGNOSIS — G4733 Obstructive sleep apnea (adult) (pediatric): Secondary | ICD-10-CM | POA: Diagnosis not present

## 2021-02-28 DIAGNOSIS — I441 Atrioventricular block, second degree: Secondary | ICD-10-CM

## 2021-02-28 DIAGNOSIS — I10 Essential (primary) hypertension: Secondary | ICD-10-CM | POA: Diagnosis not present

## 2021-02-28 NOTE — Progress Notes (Signed)
Electrophysiology TeleHealth Note   Due to national recommendations of social distancing due to COVID 19, an audio/video telehealth visit is felt to be most appropriate for this patient at this time.  See MyChart message from today for the patient's consent to telehealth for [facility].  Date:  02/28/2021   ID:  [name], DOB 07-10-1930, MRN [id number]  Location: patient's home  Provider location:  [place]  Evaluation Performed: Follow-up visit  PCP:  Lucianne Lei, [name]   Electrophysiologist:  Dr Rayann Heman  Chief Complaint:  palpitations  History of Present Illness:    [name] is a 87 y.o. female who presents via telehealth conferencing today.  Since last being seen in our clinic, the patient reports doing very well.  Since her cardioversion, she reports feeling "much better".  Her SOB is much improved.  Today, she denies symptoms of palpitations, chest pain,  lower extremity edema, dizziness, presyncope, or syncope.  The patient is otherwise without complaint today.   Past Medical History:  Diagnosis Date  . Anemia   . Arthritis   . Benign paroxysmal positional vertigo 04/06/2010  . Benign positional vertigo   . 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 CAROTID BRUIT 02/24/2008  . Complication of anesthesia    took a long time to wake up with knee replacement  . DIABETES MELLITUS, TYPE II 02/24/2008  . ESRD (end stage renal disease) on dialysis ([place])    tues thurs sat [facility] sees [place] [facility]  . GERD (gastroesophageal reflux disease)   . Glaucoma   . Gout   . HYPERTENSION 02/24/2008  . Multiple myeloma ([place])   . Obstructive sleep apnea   . Pancytopenia ([place])   . Sick sinus syndrome ([place])   . Wears dentures   . Wears glasses     Past Surgical History:  Procedure Laterality Date  . A/V FISTULAGRAM Right 03/08/2020   Procedure: A/V FISTULAGRAM- Right Arm;  Surgeon: Waynetta Sandy, [name];  Location: [place];  Service:  Cardiovascular;  Laterality: Right;  . A/V FISTULAGRAM N/A 06/28/2020   Procedure: A/V FISTULAGRAM - Right Upper Arm;  Surgeon: Waynetta Sandy, [name];  Location: [facility];  Service: Cardiovascular;  Laterality: N/A;  . ABDOMINAL HYSTERECTOMY     1980's  . AV FISTULA PLACEMENT Right 03/11/2019   Procedure: CREATION RIGHT ARM RADIOCEPHALIC ARTERIOVENOUS FISTULA;  Surgeon: Angelia Mould, [name];  Location: [facility];  Service: Vascular;  Laterality: Right;  . AV FISTULA PLACEMENT Right 03/25/2020   Procedure: right arm ARTERIOVENOUS (AV) FISTULA CREATION;  Surgeon: Waynetta Sandy, [name];  Location: [facility];  Service: Vascular;  Laterality: Right;  . BACK SURGERY     x 3  . BIOPSY  06/24/2019   Procedure: BIOPSY;  Surgeon: Ronald Lobo, [name];  Location: [facility];  Service: Endoscopy;;  . BUBBLE STUDY  01/07/2021   Procedure: BUBBLE STUDY;  Surgeon: Sueanne Margarita, [name];  Location: [place];  Service: Cardiovascular;;  . CARDIOVERSION N/A 01/07/2021   Procedure: CARDIOVERSION;  Surgeon: Sueanne Margarita, [name];  Location: [facility];  Service: Cardiovascular;  Laterality: N/A;  . EP IMPLANTABLE DEVICE N/A 05/16/2016   Procedure: PPM Generator Changeout;  Surgeon: Thompson Grayer, [name];  Location: [place] [facility];  Service: Cardiovascular;  Laterality: N/A;  . ESOPHAGOGASTRODUODENOSCOPY (EGD) WITH PROPOFOL N/A 06/24/2019   Procedure: ESOPHAGOGASTRODUODENOSCOPY (EGD) WITH PROPOFOL;  Surgeon: Ronald Lobo, [name];  Location: [facility];  Service: Endoscopy;  Laterality: N/A;  . FISTULA SUPERFICIALIZATION Right 03/11/2019   Procedure: FISTULA SUPERFICIALIZATION;  Surgeon: Angelia Mould, [name];  Location: [place];  Service: Vascular;  Laterality: Right;  . INSERTION OF DIALYSIS CATHETER Right 03/25/2020   Procedure: INSERTION OF DIALYSIS CATHETER, right internal jugular;  Surgeon: Waynetta Sandy, [name];  Location: [facility];  Service: Vascular;  Laterality: Right;  . IR FLUORO  GUIDE CV LINE RIGHT  03/07/2019  . IR US GUIDE VASC ACCESS RIGHT  03/07/2019  . KNEE SURGERY Right    2003  . KNEE SURGERY Left    2011  . PACEMAKER INSERTION    . PERIPHERAL VASCULAR BALLOON ANGIOPLASTY Right 06/28/2020   Procedure: PERIPHERAL VASCULAR BALLOON ANGIOPLASTY;  Surgeon: Waynetta Sandy, [name];  Location: [place] [facility];  Service: Cardiovascular;  Laterality: Right;  . TEE WITHOUT CARDIOVERSION N/A 01/07/2021   Procedure: TRANSESOPHAGEAL ECHOCARDIOGRAM (TEE);  Surgeon: Sueanne Margarita, [name];  Location: [facility];  Service: Cardiovascular;  Laterality: N/A;    Current Outpatient Medications  Medication Sig Dispense Refill  . acetaminophen (TYLENOL) 500 MG tablet Take 1,000 mg by mouth 2 (two) times daily as needed for mild pain or moderate pain.     Marland Kitchen acyclovir (ZOVIRAX) 400 MG tablet Take 1 tablet (400 mg total) by mouth daily. 90 tablet 6  . allopurinol (ZYLOPRIM) 100 MG tablet Take 0.5 tablets (50 mg total) by mouth daily. 30 tablet 0  . apixaban (ELIQUIS) 2.5 MG TABS tablet Take 1 tablet (2.5 mg total) by mouth 2 (two) times daily. 180 tablet 2  . cholecalciferol (VITAMIN D3) 25 MCG (1000 UT) tablet Take 2,000 Units by mouth daily.    Marland Kitchen dexamethasone (DECADRON) 1 MG tablet Take 1 tablet (1 mg total) by mouth 2 (two) times daily with a meal. 30 tablet 1  . dorzolamide (TRUSOPT) 2 % ophthalmic solution Place 1 drop into both eyes 2 (two) times daily.  1  . fluorometholone (FML) 0.1 % ophthalmic suspension Place 1 drop into the right eye daily.     Marland Kitchen glimepiride (AMARYL) 1 MG tablet Take 0.5 mg by mouth daily.     Marland Kitchen lactulose (CHRONULAC) 10 GM/15ML solution Take 15 mLs by mouth 3 (three) times daily.    Marland Kitchen lidocaine-prilocaine (EMLA) cream Apply 1 application topically as needed [facility]).     Marland Kitchen LUMIGAN 0.01 % SOLN Place 1 drop into both eyes at bedtime.    Marland Kitchen LYRICA 50 MG capsule Take 1 capsule (50 mg total) by mouth at bedtime. 90 capsule 3  . Methoxy PEG-Epoetin Beta  (MIRCERA IJ) Mircera    . metoCLOPramide (REGLAN) 5 MG tablet Take 1 tablet (5 mg total) by mouth 3 (three) times daily before meals. 90 tablet 1  . midodrine (PROAMATINE) 10 MG tablet Take 10 mg by mouth as directed. Low bp, and before dialysis    . Multiple Vitamin (MULTIVITAMIN) tablet Take 1 tablet by mouth daily.    . Nutritional Supplements (,FEEDING SUPPLEMENT, PROSOURCE PLUS) liquid Take 30 mLs by mouth 3 (three) times daily between meals. 1000 mL 0  . Nutritional Supplements (FEEDING SUPPLEMENT, NEPRO CARB STEADY,) LIQD Take 237 mLs by mouth 2 (two) times daily between meals. 10000 mL 0  . ondansetron (ZOFRAN) 4 MG tablet Take 1 tablet (4 mg total) by mouth every 8 (eight) hours as needed for nausea. 60 tablet 1  . oxyCODONE (OXY IR/ROXICODONE) 5 MG immediate release tablet Take 1 tablet (5 mg total) by mouth every 4 (four) hours  as needed for severe pain. 60 tablet 0  . pantoprazole (PROTONIX) 40 MG tablet TAKE 1 TABLET (40 MG TOTAL) BY MOUTH DAILY AS NEEDED (ACID REFLUX/INDIGESTION.). 90 tablet 3  . polyethylene glycol (MIRALAX / GLYCOLAX) 17 g packet Take 17 g by mouth daily.    Marland Kitchen senna-docusate (SENOKOT-S) 8.6-50 MG tablet Take 2 tablets by mouth 2 (two) times daily. 30 tablet 0  . sodium chloride (OCEAN) 0.65 % SOLN nasal spray Place 1 spray into both nostrils 4 (four) times daily as needed for congestion.     No current facility-administered medications for this visit.    Allergies:   Aspirin, Hydrocodone-acetaminophen, Morphine, Penicillins, Betaine, Dacarbazine, Ketorolac tromethamine, Brimonidine, Diltiazem hcl, Hydrocodone-acetaminophen, Neurontin [gabapentin], and Sulfacetamide sodium   Social History:  The patient  reports that she has never smoked. She has never used smokeless tobacco. She reports that she does not drink alcohol and does not use drugs.   ROS:  Please see the history of present illness.   All other systems are personally reviewed and negative.    Exam:     Vital Signs:  BP 128/61   Pulse 71   Ht 5' 5" (1.651 m)   Wt 110 lb (49.9 kg)   BMI 18.30 kg/m   Well sounding and appearing, alert and conversant, regular work of breathing,  good skin color Eyes- anicteric, neuro- grossly intact, skin- no apparent rash or lesions or cyanosis, mouth- oral mucosa is pink  Labs/Other Tests and Data Reviewed:    Recent Labs: 04/02/2020: B Natriuretic Peptide 260.8 12/30/2020: TSH 2.272 01/05/2021: Magnesium 1.8 01/19/2021: ALT 24; BUN 37; Creatinine, Ser 1.91; Hemoglobin 8.3; Platelets 171; Potassium 3.5; Sodium 142   Wt Readings from Last 3 Encounters:  02/28/21 110 lb (49.9 kg)  01/19/21 108 lb 9.6 oz (49.3 kg)  01/19/21 105 lb 12.8 oz (48 kg)     Last device remote is reviewed from [facility] PDF which reveals normal device function, afib burden 12%   ASSESSMENT & PLAN:    1.  Atypical atrial flutter/ persistent afib S/p recent cardioversion AF burden 12% by last remote If arrhythmias increases, amiodarone is her only AAD option She has chronic nausea which would reduce my enthusiasm for this medicine.  2. Sinus bradycardia/ second degree AF block Normal pacemaker function by remotes  3. HTN Stable No change required today  4. OSA Compliant with CPAP  5. ESRD On HD   Risks, benefits and potential toxicities for medications prescribed and/or refilled reviewed with patient today.   Follow-up:  4 months with EP APP   Patient Risk:  after full review of this patients clinical status, I feel that they are at moderate risk at this time.  Today, I have spent 15 minutes with the patient with telehealth technology discussing arrhythmia management .    Army Fossa, [name]  02/28/2021 2:31 PM     [address] [place] [address] [phone] (office) [phone] (fax)

## 2021-02-28 NOTE — Telephone Encounter (Signed)
Spoke with pt regarding appt on 02/28/21. Pt and pts son gave verbal consent for a MyChart visit.

## 2021-03-01 ENCOUNTER — Other Ambulatory Visit (HOSPITAL_COMMUNITY): Payer: Self-pay | Admitting: Physician Assistant

## 2021-03-01 NOTE — Telephone Encounter (Signed)
Prescription refill request for Eliquis received. Indication: afib  Last office visit: [facility]red, 02/28/2021 Scr: 1.91, 01/19/2021 Age: 87 yo  Weight: 49.9 kg    Pt is on the correct dose of Eliquis prescription refill sent for Eliquis 2.5 mg BID.

## 2021-03-11 ENCOUNTER — Other Ambulatory Visit (HOSPITAL_COMMUNITY): Payer: Self-pay | Admitting: Family Medicine

## 2021-03-11 ENCOUNTER — Telehealth: Payer: Self-pay | Admitting: Hematology and Oncology

## 2021-03-11 DIAGNOSIS — M25552 Pain in left hip: Secondary | ICD-10-CM

## 2021-03-11 DIAGNOSIS — G8929 Other chronic pain: Secondary | ICD-10-CM

## 2021-03-11 NOTE — Telephone Encounter (Signed)
Scheduled appointment per provider. Patient is aware. 

## 2021-03-16 ENCOUNTER — Telehealth: Payer: Self-pay

## 2021-03-16 NOTE — Telephone Encounter (Signed)
[name] called and left a message. Requesting refill on Oxycodone Rx for [name].

## 2021-03-17 ENCOUNTER — Encounter: Payer: Self-pay | Admitting: Hematology and Oncology

## 2021-03-17 ENCOUNTER — Other Ambulatory Visit: Payer: Self-pay | Admitting: Hematology and Oncology

## 2021-03-17 MED ORDER — OXYCODONE HCL 5 MG PO TABS: 5.0000 mg | ORAL_TABLET | ORAL | 0 refills | Status: DC | PRN

## 2021-03-17 NOTE — Telephone Encounter (Signed)
Sent to [facility]

## 2021-03-17 NOTE — Telephone Encounter (Signed)
Called and given message Rx sent. He verbalized understanding. °

## 2021-03-18 ENCOUNTER — Other Ambulatory Visit: Payer: Self-pay | Admitting: Family Medicine

## 2021-03-18 DIAGNOSIS — M25552 Pain in left hip: Secondary | ICD-10-CM

## 2021-04-01 ENCOUNTER — Other Ambulatory Visit: Payer: Self-pay | Admitting: Hematology and Oncology

## 2021-04-04 ENCOUNTER — Other Ambulatory Visit: Payer: Self-pay | Admitting: Hematology and Oncology

## 2021-04-04 ENCOUNTER — Encounter: Payer: Self-pay | Admitting: Hematology and Oncology

## 2021-04-06 ENCOUNTER — Ambulatory Visit
Admission: RE | Admit: 2021-04-06 | Discharge: 2021-04-06 | Disposition: A | Discharge status: Home / Self Care | Payer: Medicare PPO | Source: Ambulatory Visit | Attending: Family Medicine | Admitting: Family Medicine

## 2021-04-06 DIAGNOSIS — M25552 Pain in left hip: Secondary | ICD-10-CM

## 2021-04-06 MED ORDER — IOPAMIDOL (ISOVUE-300) INJECTION 61%
100.0000 mL | Freq: Once | INTRAVENOUS | Status: AC | PRN
  Administered 2021-04-06: 100 mL via INTRAVENOUS

## 2021-04-11 ENCOUNTER — Telehealth: Payer: Self-pay

## 2021-04-11 NOTE — Telephone Encounter (Signed)
Son called and left a message asking you to look at recent hip scan ordered by [facility]. He ask for a call back.

## 2021-04-11 NOTE — Telephone Encounter (Signed)
I spoke with him and told him from my perspective she can proceed

## 2021-04-11 NOTE — Telephone Encounter (Signed)
Called son back. He has already spoken to Dr. Rip Harbour.

## 2021-04-11 NOTE — Telephone Encounter (Signed)
Dr. Rip Harbour called and left a message. He asking if you can review recent CT scan of hip. Asking if you see any new lesions? He is considering doing a ultrasound guided hip injection for [name] to help with pain. Asking for your thoughts. Dr. Rip Harbour ask for a call back to him from Dr. Alvy Bimler. Mobile # [phone] or office [phone].

## 2021-04-13 ENCOUNTER — Ambulatory Visit: Payer: Medicare PPO | Admitting: Podiatry

## 2021-04-13 ENCOUNTER — Other Ambulatory Visit: Payer: Self-pay

## 2021-04-13 ENCOUNTER — Encounter: Payer: Self-pay | Admitting: Podiatry

## 2021-04-13 DIAGNOSIS — G620 Drug-induced polyneuropathy: Secondary | ICD-10-CM

## 2021-04-13 DIAGNOSIS — N186 End stage renal disease: Secondary | ICD-10-CM

## 2021-04-13 DIAGNOSIS — E1159 Type 2 diabetes mellitus with other circulatory complications: Secondary | ICD-10-CM

## 2021-04-13 DIAGNOSIS — T451X5A Adverse effect of antineoplastic and immunosuppressive drugs, initial encounter: Secondary | ICD-10-CM

## 2021-04-13 DIAGNOSIS — D689 Coagulation defect, unspecified: Secondary | ICD-10-CM | POA: Diagnosis not present

## 2021-04-13 DIAGNOSIS — M79674 Pain in right toe(s): Secondary | ICD-10-CM

## 2021-04-13 DIAGNOSIS — M79675 Pain in left toe(s): Secondary | ICD-10-CM

## 2021-04-13 DIAGNOSIS — B351 Tinea unguium: Secondary | ICD-10-CM

## 2021-04-13 DIAGNOSIS — Z992 Dependence on renal dialysis: Secondary | ICD-10-CM

## 2021-04-13 NOTE — Progress Notes (Signed)
This patient returns to my office for at risk foot care.  This patient requires this care by a professional since this patient will be at risk due to having peripheral neuropathy, ESRD and coagulation defect due to eliquis.  Patient is also diabetic.  This patient is unable to cut nails herself since the patient cannot reach her nails.These nails are painful walking and wearing shoes.  This patient presents for at risk foot care today.  She presents to the office with her son.  General Appearance  Alert, conversant and in no acute stress.  Vascular  Dorsalis pedis and posterior tibial  pulses are weakly  palpable  bilaterally.  Capillary return is within normal limits  bilaterally. Cold feet   Bilaterally.  Digital hair absent.  Neurologic  Senn-Weinstein monofilament wire test within normal limits  bilaterally. Muscle power within normal limits bilaterally.  Nails Thick disfigured discolored nails with subungual debris  from hallux to fifth toes bilaterally. No evidence of bacterial infection or drainage bilaterally.  Orthopedic  No limitations of motion  feet .  No crepitus or effusions noted.  No bony pathology or digital deformities noted. PTTD right foot.  Exostosis midfoot left 3rd MCJ and 1st MCJ  Right.  DJD 1st MPJ  Right foot.  Hallux varus 1st MPJ  Left foot.  Skin  normotropic skin with no porokeratosis noted bilaterally.  No signs of infections or ulcers noted.     Onychomycosis  Pain in right toes  Pain in left toes  Consent was obtained for treatment procedures.   Mechanical debridement of nails 1-5  bilaterally performed with a nail nipper.  Filed with dremel without incident.    Return office visit   4 months                  Told patient to return for periodic foot care and evaluation due to potential at risk complications.   Gardiner Barefoot DPM

## 2021-04-16 DIAGNOSIS — N186 End stage renal disease: Secondary | ICD-10-CM | POA: Diagnosis not present

## 2021-04-16 DIAGNOSIS — Z992 Dependence on renal dialysis: Secondary | ICD-10-CM | POA: Diagnosis not present

## 2021-04-16 DIAGNOSIS — N2581 Secondary hyperparathyroidism of renal origin: Secondary | ICD-10-CM | POA: Diagnosis not present

## 2021-04-18 ENCOUNTER — Other Ambulatory Visit: Payer: Self-pay | Admitting: Hematology and Oncology

## 2021-04-18 DIAGNOSIS — C9000 Multiple myeloma not having achieved remission: Secondary | ICD-10-CM | POA: Diagnosis not present

## 2021-04-19 ENCOUNTER — Telehealth: Payer: Self-pay

## 2021-04-19 ENCOUNTER — Encounter: Payer: Self-pay | Admitting: Hematology and Oncology

## 2021-04-19 DIAGNOSIS — N2581 Secondary hyperparathyroidism of renal origin: Secondary | ICD-10-CM | POA: Diagnosis not present

## 2021-04-19 DIAGNOSIS — Z992 Dependence on renal dialysis: Secondary | ICD-10-CM | POA: Diagnosis not present

## 2021-04-19 DIAGNOSIS — N186 End stage renal disease: Secondary | ICD-10-CM | POA: Diagnosis not present

## 2021-04-19 NOTE — Telephone Encounter (Signed)
Can you call her son and verify if she is taking this and needs refill?

## 2021-04-19 NOTE — Telephone Encounter (Signed)
Appt scheduled on Dialysis day. Rescheduled appt to 7/11. Son is aware of appts.

## 2021-04-19 NOTE — Telephone Encounter (Signed)
She is taking and does need a refill.

## 2021-04-20 ENCOUNTER — Other Ambulatory Visit: Payer: Medicare PPO

## 2021-04-20 ENCOUNTER — Ambulatory Visit: Payer: Medicare PPO | Admitting: Hematology and Oncology

## 2021-04-21 ENCOUNTER — Other Ambulatory Visit: Payer: Medicare PPO

## 2021-04-21 ENCOUNTER — Ambulatory Visit: Payer: Medicare PPO | Admitting: Hematology and Oncology

## 2021-04-21 DIAGNOSIS — Z992 Dependence on renal dialysis: Secondary | ICD-10-CM | POA: Diagnosis not present

## 2021-04-21 DIAGNOSIS — N186 End stage renal disease: Secondary | ICD-10-CM | POA: Diagnosis not present

## 2021-04-21 DIAGNOSIS — N2581 Secondary hyperparathyroidism of renal origin: Secondary | ICD-10-CM | POA: Diagnosis not present

## 2021-04-23 DIAGNOSIS — Z992 Dependence on renal dialysis: Secondary | ICD-10-CM | POA: Diagnosis not present

## 2021-04-23 DIAGNOSIS — N186 End stage renal disease: Secondary | ICD-10-CM | POA: Diagnosis not present

## 2021-04-23 DIAGNOSIS — N2581 Secondary hyperparathyroidism of renal origin: Secondary | ICD-10-CM | POA: Diagnosis not present

## 2021-04-25 ENCOUNTER — Encounter: Payer: Self-pay | Admitting: Hematology and Oncology

## 2021-04-25 ENCOUNTER — Other Ambulatory Visit: Payer: Medicare PPO

## 2021-04-25 ENCOUNTER — Other Ambulatory Visit: Payer: Self-pay

## 2021-04-25 ENCOUNTER — Inpatient Hospital Stay: Payer: Medicare PPO | Attending: Hematology and Oncology

## 2021-04-25 ENCOUNTER — Inpatient Hospital Stay: Payer: Medicare PPO | Admitting: Hematology and Oncology

## 2021-04-25 DIAGNOSIS — I700 Atherosclerosis of aorta: Secondary | ICD-10-CM | POA: Diagnosis not present

## 2021-04-25 DIAGNOSIS — C9000 Multiple myeloma not having achieved remission: Secondary | ICD-10-CM | POA: Diagnosis not present

## 2021-04-25 DIAGNOSIS — R110 Nausea: Secondary | ICD-10-CM | POA: Diagnosis not present

## 2021-04-25 DIAGNOSIS — N186 End stage renal disease: Secondary | ICD-10-CM | POA: Diagnosis not present

## 2021-04-25 DIAGNOSIS — G893 Neoplasm related pain (acute) (chronic): Secondary | ICD-10-CM | POA: Insufficient documentation

## 2021-04-25 DIAGNOSIS — Z992 Dependence on renal dialysis: Secondary | ICD-10-CM | POA: Insufficient documentation

## 2021-04-25 DIAGNOSIS — M1612 Unilateral primary osteoarthritis, left hip: Secondary | ICD-10-CM | POA: Diagnosis not present

## 2021-04-25 DIAGNOSIS — Z7984 Long term (current) use of oral hypoglycemic drugs: Secondary | ICD-10-CM | POA: Insufficient documentation

## 2021-04-25 DIAGNOSIS — Z79899 Other long term (current) drug therapy: Secondary | ICD-10-CM | POA: Diagnosis not present

## 2021-04-25 DIAGNOSIS — D61818 Other pancytopenia: Secondary | ICD-10-CM

## 2021-04-25 DIAGNOSIS — Z7901 Long term (current) use of anticoagulants: Secondary | ICD-10-CM | POA: Diagnosis not present

## 2021-04-25 LAB — CBC WITH DIFFERENTIAL/PLATELET
Abs Immature Granulocytes: 0.03 10*3/uL (ref 0.00–0.07)
Basophils Absolute: 0 10*3/uL (ref 0.0–0.1)
Basophils Relative: 0 %
Eosinophils Absolute: 0.1 10*3/uL (ref 0.0–0.5)
Eosinophils Relative: 1 %
HCT: 27 % — ABNORMAL LOW (ref 36.0–46.0)
Hemoglobin: 8.9 g/dL — ABNORMAL LOW (ref 12.0–15.0)
Immature Granulocytes: 0 %
Lymphocytes Relative: 18 %
Lymphs Abs: 1.4 10*3/uL (ref 0.7–4.0)
MCH: 31.2 pg (ref 26.0–34.0)
MCHC: 33 g/dL (ref 30.0–36.0)
MCV: 94.7 fL (ref 80.0–100.0)
Monocytes Absolute: 0.6 10*3/uL (ref 0.1–1.0)
Monocytes Relative: 8 %
Neutro Abs: 5.6 10*3/uL (ref 1.7–7.7)
Neutrophils Relative %: 73 %
Platelets: 197 10*3/uL (ref 150–400)
RBC: 2.85 MIL/uL — ABNORMAL LOW (ref 3.87–5.11)
RDW: 15.4 % (ref 11.5–15.5)
WBC: 7.8 10*3/uL (ref 4.0–10.5)
nRBC: 0 % (ref 0.0–0.2)

## 2021-04-25 LAB — COMPREHENSIVE METABOLIC PANEL
ALT: 20 U/L (ref 0–44)
AST: 22 U/L (ref 15–41)
Albumin: 3.8 g/dL (ref 3.5–5.0)
Alkaline Phosphatase: 39 U/L (ref 38–126)
Anion gap: 12 (ref 5–15)
BUN: 87 mg/dL — ABNORMAL HIGH (ref 8–23)
CO2: 28 mmol/L (ref 22–32)
Calcium: 9 mg/dL (ref 8.9–10.3)
Chloride: 95 mmol/L — ABNORMAL LOW (ref 98–111)
Creatinine, Ser: 4.63 mg/dL (ref 0.44–1.00)
GFR, Estimated: 9 mL/min — ABNORMAL LOW (ref >60)
Glucose, Bld: 83 mg/dL (ref 70–99)
Potassium: 4.9 mmol/L (ref 3.5–5.1)
Sodium: 135 mmol/L (ref 135–145)
Total Bilirubin: 0.4 mg/dL (ref 0.3–1.2)
Total Protein: 6.5 g/dL (ref 6.5–8.1)

## 2021-04-25 MED ORDER — DEXAMETHASONE 2 MG PO TABS: 2.0000 mg | ORAL_TABLET | Freq: Every day | ORAL | 3 refills | Status: DC

## 2021-04-25 NOTE — Assessment & Plan Note (Signed)
We discussed the limitations of treatment options due to her end-stage renal disease °She will continue hemodialysis °

## 2021-04-25 NOTE — Progress Notes (Signed)
[place] OFFICE PROGRESS NOTE  Patient Care Team: Lucianne Lei, MD as PCP - General (Family Medicine) Thompson Grayer, MD as PCP - Electrophysiology (Cardiology) Josue Hector, MD as PCP - Cardiology (Cardiology)  ASSESSMENT & PLAN:  Multiple myeloma not having achieved remission [facility]) Recent to myeloma panel was stable; I will call her son with test results once her myeloma panel is available She has been off treatment for several months I will space out her appointment to every 3 months Continue supportive care I recommend weaning her off dexamethasone  Cancer associated pain She has multifactorial bone pain She is not a candidate for aggressive management given her age and comorbidities I recommend increased dose of vitamin D supplement She has been prescribed oxycodone and that seems to help We discussed narcotic refill policy  Pancytopenia, acquired [facility] She has multifactorial anemia I would defer to her nephrologist for management of anemia Today, she is not symptomatic  ESRD (end stage renal disease) on dialysis [facility]) We discussed the limitations of treatment options due to her end-stage renal disease She will continue hemodialysis  No orders of the defined types were placed in this encounter.   All questions were answered. The patient knows to call the clinic with any problems, questions or concerns. The total time spent in the appointment was 20 minutes encounter with patients including review of chart and various tests results, discussions about plan of care and coordination of care plan   Heath Lark, MD 04/25/2021 3:36 PM  INTERVAL HISTORY: Please see below for problem oriented charting. She returns with her son for further follow-up She has gained weight since last time I saw her Her nausea has improved with dexamethasone She denies recent constipation Her pain is intermittent, well controlled with current prescribed pain medicine The  patient denies any recent signs or symptoms of bleeding such as spontaneous epistaxis, hematuria or hematochezia.   SUMMARY OF ONCOLOGIC HISTORY: Oncology History  Multiple myeloma not having achieved remission ([place])  03/03/2019 Initial Diagnosis   Multiple myeloma not having achieved remission ([place])    03/03/2019 Imaging   1. Round and oval lucent/lytic lesions in the skull, bilateral humeri, distal left femur, and possibly also in the pelvis are compatible with multiple myeloma. No right lower extremity lytic lesion identified. No pathologic fracture. 2. Advanced degenerative changes in the spine with no vertebral myeloma evident by plain radiography. 3. Advanced degenerative changes in the right upper extremity. Bilateral knee arthroplasty. 4.  Aortic Atherosclerosis (ICD10-I70.0).    03/04/2019 -  Chemotherapy   The patient had bortezomib for chemotherapy treatment.     03/04/2019 Bone Marrow Biopsy   Bone Marrow, Aspirate,Biopsy, and Clot - HYPERCELLULAR BONE MARROW FOR AGE WITH PLASMACYTOSIS. - SEE COMMENT. PERIPHERAL BLOOD: - NORMOCYTIC-NORMOCHROMIC ANEMIA. Diagnosis Note The bone marrow is hypercellular for age with increased number of atypical plasma cells averaging 30% of all cells in the aspirate associated with interstitial infiltrates and numerous variably sized aggregates in the clot and biopsy sections. 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 Confirmatory in situ hybridization for kappa and lambda as well as CD138 will be performed and the results reported in an addendum. The background shows trilineage hematopoiesis with mild non-specific changes primarily involving the erythroid series. Correlation with cytogenetic and FISH studies is recommended.     06/20/2019 -  Chemotherapy   The patient had dexamethasone (DECADRON) tablet 20 mg, 20 mg (100 % of original dose 20 mg), Oral,  Once, 19  of 21 cycles Dose modification: 20 mg (original dose 20 mg, Cycle  1), 8 mg (original dose 8 mg, Cycle 10) Administration: 20 mg (06/20/2019), 20 mg (06/27/2019), 20 mg (07/04/2019), 20 mg (07/11/2019), 20 mg (07/18/2019), 20 mg (07/25/2019), 20 mg (08/08/2019), 20 mg (08/15/2019), 20 mg (09/05/2019), 20 mg (12/01/2019), 20 mg (09/26/2019), 20 mg (10/20/2019), 20 mg (11/10/2019), 20 mg (12/22/2019), 20 mg (01/12/2020), 8 mg (02/02/2020), 8 mg (02/23/2020), 8 mg (03/22/2020), 8 mg (04/12/2020), 8 mg (05/03/2020), 8 mg (05/31/2020), 8 mg (06/23/2020), 8 mg (07/14/2020), 8 mg (08/04/2020), 8 mg (08/25/2020) daratumumab-hyaluronidase-fihj (DARZALEX FASPRO) 1800-30000 MG-UT/15ML chemo SQ injection 1,800 mg, 1,800 mg, Subcutaneous,  Once, 19 of 21 cycles Administration: 1,800 mg (06/20/2019), 1,800 mg (06/27/2019), 1,800 mg (07/04/2019), 1,800 mg (07/11/2019), 1,800 mg (07/18/2019), 1,800 mg (07/25/2019), 1,800 mg (08/08/2019), 1,800 mg (08/15/2019), 1,800 mg (09/05/2019), 1,800 mg (12/01/2019), 1,800 mg (02/02/2020), 1,800 mg (09/26/2019), 1,800 mg (10/20/2019), 1,800 mg (11/10/2019), 1,800 mg (12/22/2019), 1,800 mg (01/12/2020), 1,800 mg (02/23/2020), 1,800 mg (03/22/2020), 1,800 mg (04/12/2020), 1,800 mg (05/03/2020), 1,800 mg (05/31/2020), 1,800 mg (06/23/2020), 1,800 mg (07/14/2020), 1,800 mg (08/04/2020), 1,800 mg (08/25/2020)   for chemotherapy treatment.     01/19/2021 Cancer Staging   Staging form: Plasma Cell Myeloma and Plasma Cell Disorders, AJCC 8th Edition - Clinical stage from 01/19/2021: RISS Stage III (Beta-2-microglobulin (mg/L): 5.6, Albumin (g/dL): 3.2, ISS: Stage III, High-risk cytogenetics: Absent, LDH: Elevated) - Signed by Heath Lark, MD on 01/19/2021  Beta 2 microglobulin range (mg/L): Greater than or equal to 5.5  Albumin range (g/dL): Less than 3.5  Cytogenetics: Other mutation      REVIEW OF SYSTEMS:   Constitutional: Denies fevers, chills or abnormal weight loss Eyes: Denies blurriness of vision Ears, nose, mouth, throat, and face: Denies mucositis or sore throat Respiratory: Denies  cough, dyspnea or wheezes Cardiovascular: Denies palpitation, chest discomfort or lower extremity swelling Gastrointestinal:  Denies nausea, heartburn or change in bowel habits Skin: Denies abnormal skin rashes Lymphatics: Denies new lymphadenopathy or easy bruising Neurological:Denies numbness, tingling or new weaknesses Behavioral/Psych: Mood is stable, no new changes  All other systems were reviewed with the patient and are negative.  I have reviewed the past medical history, past surgical history, social history and family history with the patient and they are unchanged from previous note.  ALLERGIES:  is allergic to aspirin, hydrocodone-acetaminophen, morphine, penicillins, betaine, dacarbazine, ketorolac tromethamine, brimonidine, diltiazem hcl, hydrocodone-acetaminophen, neurontin [gabapentin], and sulfacetamide sodium.  MEDICATIONS:  Current Outpatient Medications  Medication Sig Dispense Refill   acetaminophen (TYLENOL) 500 MG tablet Take 1,000 mg by mouth 2 (two) times daily as needed for mild pain or moderate pain.      acyclovir (ZOVIRAX) 400 MG tablet Take 1 tablet (400 mg total) by mouth daily. 90 tablet 6   allopurinol (ZYLOPRIM) 100 MG tablet Take 0.5 tablets (50 mg total) by mouth daily. 30 tablet 0   cholecalciferol (VITAMIN D3) 25 MCG (1000 UT) tablet Take 2,000 Units by mouth daily.     dexamethasone (DECADRON) 2 MG tablet Take 1 tablet (2 mg total) by mouth daily. 30 tablet 3   dorzolamide (TRUSOPT) 2 % ophthalmic solution Place 1 drop into both eyes 2 (two) times daily.  1   ELIQUIS 2.5 MG TABS tablet TAKE 1 TABLET BY MOUTH TWICE A DAY 60 tablet 5   fluorometholone (FML) 0.1 % ophthalmic suspension Place 1 drop into the right eye daily.      glimepiride (AMARYL) 1 MG tablet Take 0.5 mg by mouth  daily.      lactulose (CHRONULAC) 10 GM/15ML solution TAKE 15 MLS (10 G TOTAL) BY MOUTH 3 (THREE) TIMES DAILY. 473 mL 11   lactulose, encephalopathy, (CHRONULAC) 10 GM/15ML SOLN       lidocaine-prilocaine (EMLA) cream Apply 1 application topically as needed [facility]).      LUMIGAN 0.01 % SOLN Place 1 drop into both eyes at bedtime.     LYRICA 50 MG capsule Take 1 capsule (50 mg total) by mouth at bedtime. 90 capsule 3   Methoxy PEG-Epoetin Beta (MIRCERA IJ) Mircera     metoCLOPramide (REGLAN) 5 MG tablet TAKE 1 TABLET BY MOUTH 3 TIMES DAILY BEFORE MEALS. 90 tablet 1   midodrine (PROAMATINE) 10 MG tablet Take 10 mg by mouth as directed. Low bp, and before dialysis     Multiple Vitamin (MULTIVITAMIN) tablet Take 1 tablet by mouth daily.     Nutritional Supplements (,FEEDING SUPPLEMENT, PROSOURCE PLUS) liquid Take 30 mLs by mouth 3 (three) times daily between meals. 1000 mL 0   Nutritional Supplements (FEEDING SUPPLEMENT, NEPRO CARB STEADY,) LIQD Take 237 mLs by mouth 2 (two) times daily between meals. 10000 mL 0   ondansetron (ZOFRAN) 4 MG tablet Take 1 tablet (4 mg total) by mouth every 8 (eight) hours as needed for nausea. 60 tablet 1   oxyCODONE (OXY IR/ROXICODONE) 5 MG immediate release tablet Take 1 tablet (5 mg total) by mouth every 4 (four) hours as needed for severe pain. 60 tablet 0   pantoprazole (PROTONIX) 40 MG tablet TAKE 1 TABLET (40 MG TOTAL) BY MOUTH DAILY AS NEEDED (ACID REFLUX/INDIGESTION.). 90 tablet 3   polyethylene glycol (MIRALAX / GLYCOLAX) 17 g packet Take 17 g by mouth daily.     senna-docusate (SENOKOT-S) 8.6-50 MG tablet Take 2 tablets by mouth 2 (two) times daily. 30 tablet 0   sodium chloride (OCEAN) 0.65 % SOLN nasal spray Place 1 spray into both nostrils 4 (four) times daily as needed for congestion.     No current facility-administered medications for this visit.    PHYSICAL EXAMINATION: ECOG PERFORMANCE STATUS: 2 - Symptomatic, <50% confined to bed  Vitals:   04/25/21 1352  BP: (!) 112/44  Pulse: 72  Resp: 18  Temp: 98.2 F (36.8 C)  SpO2: 100%   Filed Weights   04/25/21 1352  Weight: 126 lb 12.8 oz (57.5 kg)    GENERAL:alert,  no distress and comfortable Musculoskeletal:no cyanosis of digits and no clubbing  NEURO: alert & oriented x 3 with fluent speech, no focal motor/sensory deficits  LABORATORY DATA:  I have reviewed the data as listed    Component Value Date/Time   NA 135 04/25/2021 1320   K 4.9 04/25/2021 1320   CL 95 (L) 04/25/2021 1320   CO2 28 04/25/2021 1320   GLUCOSE 83 04/25/2021 1320   BUN 87 (H) 04/25/2021 1320   CREATININE 4.63 (HH) 04/25/2021 1320   CREATININE 2.71 (H) 10/20/2019 1128   CREATININE 1.03 (H) 05/11/2016 0931   CALCIUM 9.0 04/25/2021 1320   PROT 6.5 04/25/2021 1320   ALBUMIN 3.8 04/25/2021 1320   AST 22 04/25/2021 1320   AST 18 10/20/2019 1128   ALT 20 04/25/2021 1320   ALT 10 10/20/2019 1128   ALKPHOS 39 04/25/2021 1320   BILITOT 0.4 04/25/2021 1320   BILITOT 0.4 10/20/2019 1128   GFRNONAA 9 (L) 04/25/2021 1320   GFRNONAA 15 (L) 10/20/2019 1128   GFRAA 14 (L) 07/14/2020 1021   GFRAA 18 (L) 10/20/2019  1128    No results found for: SPEP, UPEP  Lab Results  Component Value Date   WBC 7.8 04/25/2021   NEUTROABS 5.6 04/25/2021   HGB 8.9 (L) 04/25/2021   HCT 27.0 (L) 04/25/2021   MCV 94.7 04/25/2021   PLT 197 04/25/2021      Chemistry      Component Value Date/Time   NA 135 04/25/2021 1320   K 4.9 04/25/2021 1320   CL 95 (L) 04/25/2021 1320   CO2 28 04/25/2021 1320   BUN 87 (H) 04/25/2021 1320   CREATININE 4.63 (HH) 04/25/2021 1320   CREATININE 2.71 (H) 10/20/2019 1128   CREATININE 1.03 (H) 05/11/2016 0931      Component Value Date/Time   CALCIUM 9.0 04/25/2021 1320   ALKPHOS 39 04/25/2021 1320   AST 22 04/25/2021 1320   AST 18 10/20/2019 1128   ALT 20 04/25/2021 1320   ALT 10 10/20/2019 1128   BILITOT 0.4 04/25/2021 1320   BILITOT 0.4 10/20/2019 1128       RADIOGRAPHIC STUDIES: I have personally reviewed the radiological images as listed and agreed with the findings in the report. CT HIP LEFT W CONTRAST  Result Date: 04/07/2021 CLINICAL  DATA:  Left hip pain, history of multiple myeloma EXAM: CT OF THE LOWER LEFT EXTREMITY WITH CONTRAST TECHNIQUE: Multidetector CT imaging of the lower left extremity was performed according to the standard protocol following intravenous contrast administration. CONTRAST:  129m ISOVUE-300 IOPAMIDOL (ISOVUE-300) INJECTION 61% COMPARISON:  CT abdomen pelvis 11/27/2019 FINDINGS: Bones/Joint/Cartilage There are numerous lytic lesions throughout the visualized pelvis, sacrum, and proximal femurs compatible with history of multiple myeloma. These lesions are similar in distribution in comparison to prior CT abdomen and pelvis in February 2021, with several that break through the cortex, for example see series 4, images 4, 9, and 51. There is no evidence of acute femoral neck fracture. There is moderate left hip osteoarthritis with likely degenerative small joint effusion containing mineralization. Similar findings of the right hip. There are unchanged findings of osteitis pubis with articular surface irregularity and periarticular mineralization. Ligaments Suboptimally assessed by CT. Muscles and Tendons There is gluteal muscle atrophy. There is hypoattenuation within the right iliopsoas muscle, which is unchanged since 05/25/2019 CT. Soft tissues Mild generalized soft tissue swelling. Atherosclerotic calcifications. Large colorectal stool burden. IMPRESSION: Numerous lytic lesions throughout the visualized pelvis, sacrum, and proximal femurs compatible with history of multiple myeloma. Lesions are similar in distribution in comparison to prior CT abdomen pelvis in February 2021, some of which demonstrate cortical breakthrough. No evidence of new acute fracture. Moderate left hip osteoarthritis with small mineralizing joint effusion. Large colorectal stool burden suggesting constipation. Electronically Signed   By: JMaurine Simmering  On: 04/07/2021 16:23

## 2021-04-25 NOTE — Assessment & Plan Note (Signed)
She has multifactorial bone pain She is not a candidate for aggressive management given her age and comorbidities I recommend increased dose of vitamin D supplement She has been prescribed oxycodone and that seems to help We discussed narcotic refill policy

## 2021-04-25 NOTE — Assessment & Plan Note (Signed)
She has multifactorial anemia I would defer to her nephrologist for management of anemia Today, she is not symptomatic

## 2021-04-25 NOTE — Assessment & Plan Note (Signed)
Recent to myeloma panel was stable; I will call her son with test results once her myeloma panel is available She has been off treatment for several months I will space out her appointment to every 3 months Continue supportive care I recommend weaning her off dexamethasone

## 2021-04-26 DIAGNOSIS — Z992 Dependence on renal dialysis: Secondary | ICD-10-CM | POA: Diagnosis not present

## 2021-04-26 DIAGNOSIS — N186 End stage renal disease: Secondary | ICD-10-CM | POA: Diagnosis not present

## 2021-04-26 DIAGNOSIS — N2581 Secondary hyperparathyroidism of renal origin: Secondary | ICD-10-CM | POA: Diagnosis not present

## 2021-04-26 LAB — KAPPA/LAMBDA LIGHT CHAINS
Kappa free light chain: 37.4 mg/L — ABNORMAL HIGH (ref 3.3–19.4)
Kappa, lambda light chain ratio: 2.34 — ABNORMAL HIGH (ref 0.26–1.65)
Lambda free light chains: 16 mg/L (ref 5.7–26.3)

## 2021-04-27 DIAGNOSIS — M7060 Trochanteric bursitis, unspecified hip: Secondary | ICD-10-CM | POA: Diagnosis not present

## 2021-04-27 DIAGNOSIS — M4727 Other spondylosis with radiculopathy, lumbosacral region: Secondary | ICD-10-CM | POA: Diagnosis not present

## 2021-04-27 DIAGNOSIS — H4043X3 Glaucoma secondary to eye inflammation, bilateral, severe stage: Secondary | ICD-10-CM | POA: Diagnosis not present

## 2021-04-27 DIAGNOSIS — G894 Chronic pain syndrome: Secondary | ICD-10-CM | POA: Diagnosis not present

## 2021-04-27 DIAGNOSIS — M5459 Other low back pain: Secondary | ICD-10-CM | POA: Diagnosis not present

## 2021-04-27 DIAGNOSIS — H25813 Combined forms of age-related cataract, bilateral: Secondary | ICD-10-CM | POA: Diagnosis not present

## 2021-04-27 DIAGNOSIS — Z79891 Long term (current) use of opiate analgesic: Secondary | ICD-10-CM | POA: Diagnosis not present

## 2021-04-27 DIAGNOSIS — N186 End stage renal disease: Secondary | ICD-10-CM | POA: Diagnosis not present

## 2021-04-27 DIAGNOSIS — M25559 Pain in unspecified hip: Secondary | ICD-10-CM | POA: Diagnosis not present

## 2021-04-28 DIAGNOSIS — Z992 Dependence on renal dialysis: Secondary | ICD-10-CM | POA: Diagnosis not present

## 2021-04-28 DIAGNOSIS — N2581 Secondary hyperparathyroidism of renal origin: Secondary | ICD-10-CM | POA: Diagnosis not present

## 2021-04-28 DIAGNOSIS — N186 End stage renal disease: Secondary | ICD-10-CM | POA: Diagnosis not present

## 2021-04-29 LAB — MULTIPLE MYELOMA PANEL, SERUM
Albumin SerPl Elph-Mcnc: 3.8 g/dL (ref 2.9–4.4)
Albumin/Glob SerPl: 1.8 — ABNORMAL HIGH (ref 0.7–1.7)
Alpha 1: 0.2 g/dL (ref 0.0–0.4)
Alpha2 Glob SerPl Elph-Mcnc: 0.7 g/dL (ref 0.4–1.0)
B-Globulin SerPl Elph-Mcnc: 0.9 g/dL (ref 0.7–1.3)
Gamma Glob SerPl Elph-Mcnc: 0.3 g/dL — ABNORMAL LOW (ref 0.4–1.8)
Globulin, Total: 2.2 g/dL (ref 2.2–3.9)
IgA: 25 mg/dL — ABNORMAL LOW (ref 64–422)
IgG (Immunoglobin G), Serum: 528 mg/dL — ABNORMAL LOW (ref 586–1602)
IgM (Immunoglobulin M), Srm: 12 mg/dL — ABNORMAL LOW (ref 26–217)
M Protein SerPl Elph-Mcnc: 0.2 g/dL — ABNORMAL HIGH
Total Protein ELP: 6 g/dL (ref 6.0–8.5)

## 2021-04-30 DIAGNOSIS — N186 End stage renal disease: Secondary | ICD-10-CM | POA: Diagnosis not present

## 2021-04-30 DIAGNOSIS — N2581 Secondary hyperparathyroidism of renal origin: Secondary | ICD-10-CM | POA: Diagnosis not present

## 2021-04-30 DIAGNOSIS — Z992 Dependence on renal dialysis: Secondary | ICD-10-CM | POA: Diagnosis not present

## 2021-05-02 ENCOUNTER — Telehealth: Payer: Self-pay

## 2021-05-02 NOTE — Telephone Encounter (Signed)
Called and given below message to son. He verbalized understanding . ?

## 2021-05-02 NOTE — Telephone Encounter (Signed)
-----   Message from Heath Lark, MD sent at 05/02/2021  8:50 AM EDT ----- Pls call her or son Myeloma panel is stable

## 2021-05-03 DIAGNOSIS — N2581 Secondary hyperparathyroidism of renal origin: Secondary | ICD-10-CM | POA: Diagnosis not present

## 2021-05-03 DIAGNOSIS — N186 End stage renal disease: Secondary | ICD-10-CM | POA: Diagnosis not present

## 2021-05-03 DIAGNOSIS — Z992 Dependence on renal dialysis: Secondary | ICD-10-CM | POA: Diagnosis not present

## 2021-05-05 DIAGNOSIS — N2581 Secondary hyperparathyroidism of renal origin: Secondary | ICD-10-CM | POA: Diagnosis not present

## 2021-05-05 DIAGNOSIS — N186 End stage renal disease: Secondary | ICD-10-CM | POA: Diagnosis not present

## 2021-05-05 DIAGNOSIS — Z992 Dependence on renal dialysis: Secondary | ICD-10-CM | POA: Diagnosis not present

## 2021-05-06 ENCOUNTER — Ambulatory Visit (INDEPENDENT_AMBULATORY_CARE_PROVIDER_SITE_OTHER): Payer: Medicare PPO

## 2021-05-06 DIAGNOSIS — I495 Sick sinus syndrome: Secondary | ICD-10-CM | POA: Diagnosis not present

## 2021-05-07 DIAGNOSIS — N186 End stage renal disease: Secondary | ICD-10-CM | POA: Diagnosis not present

## 2021-05-07 DIAGNOSIS — Z992 Dependence on renal dialysis: Secondary | ICD-10-CM | POA: Diagnosis not present

## 2021-05-07 DIAGNOSIS — N2581 Secondary hyperparathyroidism of renal origin: Secondary | ICD-10-CM | POA: Diagnosis not present

## 2021-05-09 LAB — CUP PACEART REMOTE DEVICE CHECK
Battery Remaining Longevity: 34 mo
Battery Remaining Percentage: 38 %
Battery Voltage: 2.95 V
Brady Statistic AP VP Percent: 70 %
Brady Statistic AP VS Percent: 21 %
Brady Statistic AS VP Percent: 3.5 %
Brady Statistic AS VS Percent: 4.6 %
Brady Statistic RA Percent Paced: 90 %
Brady Statistic RV Percent Paced: 73 %
Date Time Interrogation Session: 20220722020017
Implantable Lead Implant Date: 20100422
Implantable Lead Implant Date: 20100422
Implantable Lead Location: 753859
Implantable Lead Location: 753860
Implantable Pulse Generator Implant Date: 20170801
Lead Channel Impedance Value: 350 Ohm
Lead Channel Impedance Value: 360 Ohm
Lead Channel Pacing Threshold Amplitude: 0.5 V
Lead Channel Pacing Threshold Amplitude: 0.75 V
Lead Channel Pacing Threshold Pulse Width: 0.5 ms
Lead Channel Pacing Threshold Pulse Width: 0.5 ms
Lead Channel Sensing Intrinsic Amplitude: 2.9 mV
Lead Channel Sensing Intrinsic Amplitude: 9.1 mV
Lead Channel Setting Pacing Amplitude: 2 V
Lead Channel Setting Pacing Amplitude: 2.5 V
Lead Channel Setting Pacing Pulse Width: 0.5 ms
Lead Channel Setting Sensing Sensitivity: 2 mV
Pulse Gen Model: 2272
Pulse Gen Serial Number: 7929552

## 2021-05-10 DIAGNOSIS — N186 End stage renal disease: Secondary | ICD-10-CM | POA: Diagnosis not present

## 2021-05-10 DIAGNOSIS — N2581 Secondary hyperparathyroidism of renal origin: Secondary | ICD-10-CM | POA: Diagnosis not present

## 2021-05-10 DIAGNOSIS — Z992 Dependence on renal dialysis: Secondary | ICD-10-CM | POA: Diagnosis not present

## 2021-05-12 DIAGNOSIS — N186 End stage renal disease: Secondary | ICD-10-CM | POA: Diagnosis not present

## 2021-05-12 DIAGNOSIS — N2581 Secondary hyperparathyroidism of renal origin: Secondary | ICD-10-CM | POA: Diagnosis not present

## 2021-05-12 DIAGNOSIS — Z992 Dependence on renal dialysis: Secondary | ICD-10-CM | POA: Diagnosis not present

## 2021-05-14 DIAGNOSIS — N2581 Secondary hyperparathyroidism of renal origin: Secondary | ICD-10-CM | POA: Diagnosis not present

## 2021-05-14 DIAGNOSIS — N186 End stage renal disease: Secondary | ICD-10-CM | POA: Diagnosis not present

## 2021-05-14 DIAGNOSIS — Z992 Dependence on renal dialysis: Secondary | ICD-10-CM | POA: Diagnosis not present

## 2021-05-15 DIAGNOSIS — Z992 Dependence on renal dialysis: Secondary | ICD-10-CM | POA: Diagnosis not present

## 2021-05-15 DIAGNOSIS — E1122 Type 2 diabetes mellitus with diabetic chronic kidney disease: Secondary | ICD-10-CM | POA: Diagnosis not present

## 2021-05-15 DIAGNOSIS — N186 End stage renal disease: Secondary | ICD-10-CM | POA: Diagnosis not present

## 2021-05-17 DIAGNOSIS — N186 End stage renal disease: Secondary | ICD-10-CM | POA: Diagnosis not present

## 2021-05-17 DIAGNOSIS — Z992 Dependence on renal dialysis: Secondary | ICD-10-CM | POA: Diagnosis not present

## 2021-05-17 DIAGNOSIS — N2581 Secondary hyperparathyroidism of renal origin: Secondary | ICD-10-CM | POA: Diagnosis not present

## 2021-05-19 DIAGNOSIS — N186 End stage renal disease: Secondary | ICD-10-CM | POA: Diagnosis not present

## 2021-05-19 DIAGNOSIS — Z992 Dependence on renal dialysis: Secondary | ICD-10-CM | POA: Diagnosis not present

## 2021-05-19 DIAGNOSIS — C9000 Multiple myeloma not having achieved remission: Secondary | ICD-10-CM | POA: Diagnosis not present

## 2021-05-19 DIAGNOSIS — N2581 Secondary hyperparathyroidism of renal origin: Secondary | ICD-10-CM | POA: Diagnosis not present

## 2021-05-21 DIAGNOSIS — N186 End stage renal disease: Secondary | ICD-10-CM | POA: Diagnosis not present

## 2021-05-21 DIAGNOSIS — Z992 Dependence on renal dialysis: Secondary | ICD-10-CM | POA: Diagnosis not present

## 2021-05-21 DIAGNOSIS — N2581 Secondary hyperparathyroidism of renal origin: Secondary | ICD-10-CM | POA: Diagnosis not present

## 2021-05-24 DIAGNOSIS — N2581 Secondary hyperparathyroidism of renal origin: Secondary | ICD-10-CM | POA: Diagnosis not present

## 2021-05-24 DIAGNOSIS — N186 End stage renal disease: Secondary | ICD-10-CM | POA: Diagnosis not present

## 2021-05-24 DIAGNOSIS — Z992 Dependence on renal dialysis: Secondary | ICD-10-CM | POA: Diagnosis not present

## 2021-05-26 DIAGNOSIS — N2581 Secondary hyperparathyroidism of renal origin: Secondary | ICD-10-CM | POA: Diagnosis not present

## 2021-05-26 DIAGNOSIS — Z992 Dependence on renal dialysis: Secondary | ICD-10-CM | POA: Diagnosis not present

## 2021-05-26 DIAGNOSIS — N186 End stage renal disease: Secondary | ICD-10-CM | POA: Diagnosis not present

## 2021-05-28 DIAGNOSIS — Z992 Dependence on renal dialysis: Secondary | ICD-10-CM | POA: Diagnosis not present

## 2021-05-28 DIAGNOSIS — N186 End stage renal disease: Secondary | ICD-10-CM | POA: Diagnosis not present

## 2021-05-28 DIAGNOSIS — N2581 Secondary hyperparathyroidism of renal origin: Secondary | ICD-10-CM | POA: Diagnosis not present

## 2021-05-30 NOTE — Progress Notes (Signed)
Remote pacemaker transmission.   

## 2021-05-31 DIAGNOSIS — N2581 Secondary hyperparathyroidism of renal origin: Secondary | ICD-10-CM | POA: Diagnosis not present

## 2021-05-31 DIAGNOSIS — N186 End stage renal disease: Secondary | ICD-10-CM | POA: Diagnosis not present

## 2021-05-31 DIAGNOSIS — Z992 Dependence on renal dialysis: Secondary | ICD-10-CM | POA: Diagnosis not present

## 2021-06-01 DIAGNOSIS — G894 Chronic pain syndrome: Secondary | ICD-10-CM | POA: Diagnosis not present

## 2021-06-01 DIAGNOSIS — M5459 Other low back pain: Secondary | ICD-10-CM | POA: Diagnosis not present

## 2021-06-01 DIAGNOSIS — M4727 Other spondylosis with radiculopathy, lumbosacral region: Secondary | ICD-10-CM | POA: Diagnosis not present

## 2021-06-01 DIAGNOSIS — M7060 Trochanteric bursitis, unspecified hip: Secondary | ICD-10-CM | POA: Diagnosis not present

## 2021-06-02 DIAGNOSIS — N2581 Secondary hyperparathyroidism of renal origin: Secondary | ICD-10-CM | POA: Diagnosis not present

## 2021-06-02 DIAGNOSIS — N186 End stage renal disease: Secondary | ICD-10-CM | POA: Diagnosis not present

## 2021-06-02 DIAGNOSIS — Z992 Dependence on renal dialysis: Secondary | ICD-10-CM | POA: Diagnosis not present

## 2021-06-04 DIAGNOSIS — N186 End stage renal disease: Secondary | ICD-10-CM | POA: Diagnosis not present

## 2021-06-04 DIAGNOSIS — Z992 Dependence on renal dialysis: Secondary | ICD-10-CM | POA: Diagnosis not present

## 2021-06-04 DIAGNOSIS — N2581 Secondary hyperparathyroidism of renal origin: Secondary | ICD-10-CM | POA: Diagnosis not present

## 2021-06-07 DIAGNOSIS — Z992 Dependence on renal dialysis: Secondary | ICD-10-CM | POA: Diagnosis not present

## 2021-06-07 DIAGNOSIS — N2581 Secondary hyperparathyroidism of renal origin: Secondary | ICD-10-CM | POA: Diagnosis not present

## 2021-06-07 DIAGNOSIS — N186 End stage renal disease: Secondary | ICD-10-CM | POA: Diagnosis not present

## 2021-06-09 DIAGNOSIS — Z992 Dependence on renal dialysis: Secondary | ICD-10-CM | POA: Diagnosis not present

## 2021-06-09 DIAGNOSIS — N186 End stage renal disease: Secondary | ICD-10-CM | POA: Diagnosis not present

## 2021-06-09 DIAGNOSIS — N2581 Secondary hyperparathyroidism of renal origin: Secondary | ICD-10-CM | POA: Diagnosis not present

## 2021-06-11 DIAGNOSIS — Z992 Dependence on renal dialysis: Secondary | ICD-10-CM | POA: Diagnosis not present

## 2021-06-11 DIAGNOSIS — N186 End stage renal disease: Secondary | ICD-10-CM | POA: Diagnosis not present

## 2021-06-11 DIAGNOSIS — N2581 Secondary hyperparathyroidism of renal origin: Secondary | ICD-10-CM | POA: Diagnosis not present

## 2021-06-15 DIAGNOSIS — E1122 Type 2 diabetes mellitus with diabetic chronic kidney disease: Secondary | ICD-10-CM | POA: Diagnosis not present

## 2021-06-15 DIAGNOSIS — N2581 Secondary hyperparathyroidism of renal origin: Secondary | ICD-10-CM | POA: Diagnosis not present

## 2021-06-15 DIAGNOSIS — N186 End stage renal disease: Secondary | ICD-10-CM | POA: Diagnosis not present

## 2021-06-15 DIAGNOSIS — Z992 Dependence on renal dialysis: Secondary | ICD-10-CM | POA: Diagnosis not present

## 2021-06-16 DIAGNOSIS — N186 End stage renal disease: Secondary | ICD-10-CM | POA: Diagnosis not present

## 2021-06-16 DIAGNOSIS — Z992 Dependence on renal dialysis: Secondary | ICD-10-CM | POA: Diagnosis not present

## 2021-06-16 DIAGNOSIS — N2581 Secondary hyperparathyroidism of renal origin: Secondary | ICD-10-CM | POA: Diagnosis not present

## 2021-06-18 DIAGNOSIS — Z992 Dependence on renal dialysis: Secondary | ICD-10-CM | POA: Diagnosis not present

## 2021-06-18 DIAGNOSIS — N2581 Secondary hyperparathyroidism of renal origin: Secondary | ICD-10-CM | POA: Diagnosis not present

## 2021-06-18 DIAGNOSIS — N186 End stage renal disease: Secondary | ICD-10-CM | POA: Diagnosis not present

## 2021-06-19 DIAGNOSIS — C9000 Multiple myeloma not having achieved remission: Secondary | ICD-10-CM | POA: Diagnosis not present

## 2021-06-21 DIAGNOSIS — N186 End stage renal disease: Secondary | ICD-10-CM | POA: Diagnosis not present

## 2021-06-21 DIAGNOSIS — Z992 Dependence on renal dialysis: Secondary | ICD-10-CM | POA: Diagnosis not present

## 2021-06-21 DIAGNOSIS — N2581 Secondary hyperparathyroidism of renal origin: Secondary | ICD-10-CM | POA: Diagnosis not present

## 2021-06-22 ENCOUNTER — Other Ambulatory Visit: Payer: Self-pay | Admitting: Hematology and Oncology

## 2021-06-23 DIAGNOSIS — N2581 Secondary hyperparathyroidism of renal origin: Secondary | ICD-10-CM | POA: Diagnosis not present

## 2021-06-23 DIAGNOSIS — N186 End stage renal disease: Secondary | ICD-10-CM | POA: Diagnosis not present

## 2021-06-23 DIAGNOSIS — Z992 Dependence on renal dialysis: Secondary | ICD-10-CM | POA: Diagnosis not present

## 2021-06-25 DIAGNOSIS — Z992 Dependence on renal dialysis: Secondary | ICD-10-CM | POA: Diagnosis not present

## 2021-06-25 DIAGNOSIS — N2581 Secondary hyperparathyroidism of renal origin: Secondary | ICD-10-CM | POA: Diagnosis not present

## 2021-06-25 DIAGNOSIS — N186 End stage renal disease: Secondary | ICD-10-CM | POA: Diagnosis not present

## 2021-06-28 DIAGNOSIS — N2581 Secondary hyperparathyroidism of renal origin: Secondary | ICD-10-CM | POA: Diagnosis not present

## 2021-06-28 DIAGNOSIS — N186 End stage renal disease: Secondary | ICD-10-CM | POA: Diagnosis not present

## 2021-06-28 DIAGNOSIS — Z992 Dependence on renal dialysis: Secondary | ICD-10-CM | POA: Diagnosis not present

## 2021-06-29 DIAGNOSIS — Z992 Dependence on renal dialysis: Secondary | ICD-10-CM | POA: Diagnosis not present

## 2021-06-29 DIAGNOSIS — R413 Other amnesia: Secondary | ICD-10-CM | POA: Diagnosis not present

## 2021-06-29 DIAGNOSIS — N186 End stage renal disease: Secondary | ICD-10-CM | POA: Diagnosis not present

## 2021-06-29 DIAGNOSIS — F064 Anxiety disorder due to known physiological condition: Secondary | ICD-10-CM | POA: Diagnosis not present

## 2021-06-29 DIAGNOSIS — E1122 Type 2 diabetes mellitus with diabetic chronic kidney disease: Secondary | ICD-10-CM | POA: Diagnosis not present

## 2021-06-30 DIAGNOSIS — N186 End stage renal disease: Secondary | ICD-10-CM | POA: Diagnosis not present

## 2021-06-30 DIAGNOSIS — Z992 Dependence on renal dialysis: Secondary | ICD-10-CM | POA: Diagnosis not present

## 2021-06-30 DIAGNOSIS — N2581 Secondary hyperparathyroidism of renal origin: Secondary | ICD-10-CM | POA: Diagnosis not present

## 2021-07-02 DIAGNOSIS — N2581 Secondary hyperparathyroidism of renal origin: Secondary | ICD-10-CM | POA: Diagnosis not present

## 2021-07-02 DIAGNOSIS — Z992 Dependence on renal dialysis: Secondary | ICD-10-CM | POA: Diagnosis not present

## 2021-07-02 DIAGNOSIS — N186 End stage renal disease: Secondary | ICD-10-CM | POA: Diagnosis not present

## 2021-07-05 DIAGNOSIS — N2581 Secondary hyperparathyroidism of renal origin: Secondary | ICD-10-CM | POA: Diagnosis not present

## 2021-07-05 DIAGNOSIS — Z992 Dependence on renal dialysis: Secondary | ICD-10-CM | POA: Diagnosis not present

## 2021-07-05 DIAGNOSIS — N186 End stage renal disease: Secondary | ICD-10-CM | POA: Diagnosis not present

## 2021-07-06 DIAGNOSIS — M5459 Other low back pain: Secondary | ICD-10-CM | POA: Diagnosis not present

## 2021-07-06 DIAGNOSIS — M4727 Other spondylosis with radiculopathy, lumbosacral region: Secondary | ICD-10-CM | POA: Diagnosis not present

## 2021-07-06 DIAGNOSIS — G894 Chronic pain syndrome: Secondary | ICD-10-CM | POA: Diagnosis not present

## 2021-07-06 DIAGNOSIS — M7060 Trochanteric bursitis, unspecified hip: Secondary | ICD-10-CM | POA: Diagnosis not present

## 2021-07-07 DIAGNOSIS — Z992 Dependence on renal dialysis: Secondary | ICD-10-CM | POA: Diagnosis not present

## 2021-07-07 DIAGNOSIS — N186 End stage renal disease: Secondary | ICD-10-CM | POA: Diagnosis not present

## 2021-07-07 DIAGNOSIS — N2581 Secondary hyperparathyroidism of renal origin: Secondary | ICD-10-CM | POA: Diagnosis not present

## 2021-07-09 DIAGNOSIS — N2581 Secondary hyperparathyroidism of renal origin: Secondary | ICD-10-CM | POA: Diagnosis not present

## 2021-07-09 DIAGNOSIS — Z992 Dependence on renal dialysis: Secondary | ICD-10-CM | POA: Diagnosis not present

## 2021-07-09 DIAGNOSIS — N186 End stage renal disease: Secondary | ICD-10-CM | POA: Diagnosis not present

## 2021-07-12 DIAGNOSIS — N186 End stage renal disease: Secondary | ICD-10-CM | POA: Diagnosis not present

## 2021-07-12 DIAGNOSIS — N2581 Secondary hyperparathyroidism of renal origin: Secondary | ICD-10-CM | POA: Diagnosis not present

## 2021-07-12 DIAGNOSIS — Z992 Dependence on renal dialysis: Secondary | ICD-10-CM | POA: Diagnosis not present

## 2021-07-14 DIAGNOSIS — N186 End stage renal disease: Secondary | ICD-10-CM | POA: Diagnosis not present

## 2021-07-14 DIAGNOSIS — Z992 Dependence on renal dialysis: Secondary | ICD-10-CM | POA: Diagnosis not present

## 2021-07-14 DIAGNOSIS — N2581 Secondary hyperparathyroidism of renal origin: Secondary | ICD-10-CM | POA: Diagnosis not present

## 2021-07-15 DIAGNOSIS — E1122 Type 2 diabetes mellitus with diabetic chronic kidney disease: Secondary | ICD-10-CM | POA: Diagnosis not present

## 2021-07-15 DIAGNOSIS — N186 End stage renal disease: Secondary | ICD-10-CM | POA: Diagnosis not present

## 2021-07-15 DIAGNOSIS — Z992 Dependence on renal dialysis: Secondary | ICD-10-CM | POA: Diagnosis not present

## 2021-07-16 DIAGNOSIS — N2581 Secondary hyperparathyroidism of renal origin: Secondary | ICD-10-CM | POA: Diagnosis not present

## 2021-07-16 DIAGNOSIS — Z992 Dependence on renal dialysis: Secondary | ICD-10-CM | POA: Diagnosis not present

## 2021-07-16 DIAGNOSIS — N186 End stage renal disease: Secondary | ICD-10-CM | POA: Diagnosis not present

## 2021-07-19 DIAGNOSIS — C9000 Multiple myeloma not having achieved remission: Secondary | ICD-10-CM | POA: Diagnosis not present

## 2021-07-19 DIAGNOSIS — N186 End stage renal disease: Secondary | ICD-10-CM | POA: Diagnosis not present

## 2021-07-19 DIAGNOSIS — Z992 Dependence on renal dialysis: Secondary | ICD-10-CM | POA: Diagnosis not present

## 2021-07-19 DIAGNOSIS — N2581 Secondary hyperparathyroidism of renal origin: Secondary | ICD-10-CM | POA: Diagnosis not present

## 2021-07-20 DIAGNOSIS — F064 Anxiety disorder due to known physiological condition: Secondary | ICD-10-CM | POA: Diagnosis not present

## 2021-07-20 DIAGNOSIS — Z992 Dependence on renal dialysis: Secondary | ICD-10-CM | POA: Diagnosis not present

## 2021-07-20 DIAGNOSIS — Z0001 Encounter for general adult medical examination with abnormal findings: Secondary | ICD-10-CM | POA: Diagnosis not present

## 2021-07-20 DIAGNOSIS — N186 End stage renal disease: Secondary | ICD-10-CM | POA: Diagnosis not present

## 2021-07-21 DIAGNOSIS — N2581 Secondary hyperparathyroidism of renal origin: Secondary | ICD-10-CM | POA: Diagnosis not present

## 2021-07-21 DIAGNOSIS — N186 End stage renal disease: Secondary | ICD-10-CM | POA: Diagnosis not present

## 2021-07-21 DIAGNOSIS — Z992 Dependence on renal dialysis: Secondary | ICD-10-CM | POA: Diagnosis not present

## 2021-07-23 DIAGNOSIS — Z992 Dependence on renal dialysis: Secondary | ICD-10-CM | POA: Diagnosis not present

## 2021-07-23 DIAGNOSIS — N186 End stage renal disease: Secondary | ICD-10-CM | POA: Diagnosis not present

## 2021-07-23 DIAGNOSIS — N2581 Secondary hyperparathyroidism of renal origin: Secondary | ICD-10-CM | POA: Diagnosis not present

## 2021-07-25 ENCOUNTER — Other Ambulatory Visit: Payer: Self-pay

## 2021-07-25 ENCOUNTER — Encounter: Payer: Self-pay | Admitting: Hematology and Oncology

## 2021-07-25 ENCOUNTER — Inpatient Hospital Stay: Payer: Medicare PPO | Admitting: Hematology and Oncology

## 2021-07-25 ENCOUNTER — Inpatient Hospital Stay: Payer: Medicare PPO | Attending: Hematology and Oncology

## 2021-07-25 DIAGNOSIS — Z7984 Long term (current) use of oral hypoglycemic drugs: Secondary | ICD-10-CM | POA: Insufficient documentation

## 2021-07-25 DIAGNOSIS — Z992 Dependence on renal dialysis: Secondary | ICD-10-CM | POA: Diagnosis not present

## 2021-07-25 DIAGNOSIS — N186 End stage renal disease: Secondary | ICD-10-CM

## 2021-07-25 DIAGNOSIS — D631 Anemia in chronic kidney disease: Secondary | ICD-10-CM | POA: Diagnosis not present

## 2021-07-25 DIAGNOSIS — Z79899 Other long term (current) drug therapy: Secondary | ICD-10-CM | POA: Diagnosis not present

## 2021-07-25 DIAGNOSIS — Z7901 Long term (current) use of anticoagulants: Secondary | ICD-10-CM | POA: Diagnosis not present

## 2021-07-25 DIAGNOSIS — C9000 Multiple myeloma not having achieved remission: Secondary | ICD-10-CM | POA: Insufficient documentation

## 2021-07-25 LAB — CBC WITH DIFFERENTIAL/PLATELET
Abs Immature Granulocytes: 0.03 10*3/uL (ref 0.00–0.07)
Basophils Absolute: 0 10*3/uL (ref 0.0–0.1)
Basophils Relative: 0 %
Eosinophils Absolute: 0 10*3/uL (ref 0.0–0.5)
Eosinophils Relative: 0 %
HCT: 35.1 % — ABNORMAL LOW (ref 36.0–46.0)
Hemoglobin: 11.3 g/dL — ABNORMAL LOW (ref 12.0–15.0)
Immature Granulocytes: 0 %
Lymphocytes Relative: 25 %
Lymphs Abs: 1.8 10*3/uL (ref 0.7–4.0)
MCH: 32.5 pg (ref 26.0–34.0)
MCHC: 32.2 g/dL (ref 30.0–36.0)
MCV: 100.9 fL — ABNORMAL HIGH (ref 80.0–100.0)
Monocytes Absolute: 0.5 10*3/uL (ref 0.1–1.0)
Monocytes Relative: 7 %
Neutro Abs: 4.9 10*3/uL (ref 1.7–7.7)
Neutrophils Relative %: 68 %
Platelets: 204 10*3/uL (ref 150–400)
RBC: 3.48 MIL/uL — ABNORMAL LOW (ref 3.87–5.11)
RDW: 14.7 % (ref 11.5–15.5)
WBC: 7.3 10*3/uL (ref 4.0–10.5)
nRBC: 0 % (ref 0.0–0.2)

## 2021-07-25 LAB — COMPREHENSIVE METABOLIC PANEL
ALT: 17 U/L (ref 0–44)
AST: 22 U/L (ref 15–41)
Albumin: 3.6 g/dL (ref 3.5–5.0)
Alkaline Phosphatase: 42 U/L (ref 38–126)
Anion gap: 14 (ref 5–15)
BUN: 63 mg/dL — ABNORMAL HIGH (ref 8–23)
CO2: 23 mmol/L (ref 22–32)
Calcium: 8.8 mg/dL — ABNORMAL LOW (ref 8.9–10.3)
Chloride: 97 mmol/L — ABNORMAL LOW (ref 98–111)
Creatinine, Ser: 4.48 mg/dL (ref 0.44–1.00)
GFR, Estimated: 9 mL/min — ABNORMAL LOW (ref >60)
Glucose, Bld: 119 mg/dL — ABNORMAL HIGH (ref 70–99)
Potassium: 4.3 mmol/L (ref 3.5–5.1)
Sodium: 134 mmol/L — ABNORMAL LOW (ref 135–145)
Total Bilirubin: 0.4 mg/dL (ref 0.3–1.2)
Total Protein: 6.3 g/dL — ABNORMAL LOW (ref 6.5–8.1)

## 2021-07-25 NOTE — Assessment & Plan Note (Signed)
We discussed the limitations of treatment options due to her end-stage renal disease °She will continue hemodialysis °

## 2021-07-25 NOTE — Assessment & Plan Note (Signed)
Recent to myeloma panel was stable; I will call her son with test results once her myeloma panel is available She has been off treatment for several months I will space out her appointment to every 3 months Continue supportive care I recommend weaning her off dexamethasone

## 2021-07-25 NOTE — Progress Notes (Signed)
CRITICAL VALUE STICKER  CRITICAL VALUE: Creatinine 4.48  RECEIVER (on-site recipient of call): Shelle Iron  DATE & TIME NOTIFIED: 1243 07/25/21  MESSENGER (representative from lab): Martina Sinner  MD NOTIFIED: Dr. Alvy Bimler  TIME OF NOTIFICATION: 07/25/21 at 1252  RESPONSE:  Dialysis patient. No new orders.

## 2021-07-25 NOTE — Assessment & Plan Note (Signed)
Her anemia has improved dramatically She will continue ESA as prescribed by dialysis center I suspect the steroids might have helped Observe closely for now

## 2021-07-25 NOTE — Progress Notes (Signed)
[facility] PROGRESS NOTE  Patient Care Team: Lucianne Lei, MD as PCP - General (Family Medicine) Thompson Grayer, MD as PCP - Electrophysiology (Cardiology) Josue Hector, MD as PCP - Cardiology (Cardiology)  ASSESSMENT & PLAN:  Multiple myeloma not having achieved remission [facility]) Recent to myeloma panel was stable; I will call her son with test results once her myeloma panel is available She has been off treatment for several months I will space out her appointment to every 3 months Continue supportive care I recommend weaning her off dexamethasone  ESRD (end stage renal disease) on dialysis [facility]) We discussed the limitations of treatment options due to her end-stage renal disease She will continue hemodialysis  Anemia in chronic kidney disease Her anemia has improved dramatically She will continue ESA as prescribed by dialysis center I suspect the steroids might have helped Observe closely for now  No orders of the defined types were placed in this encounter.   All questions were answered. The patient knows to call the clinic with any problems, questions or concerns. The total time spent in the appointment was 20 minutes encounter with patients including review of chart and various tests results, discussions about plan of care and coordination of care plan   Heath Lark, MD 07/25/2021 1:40 PM  INTERVAL HISTORY: Please see below for problem oriented charting. she returns for treatment follow-up on surveillance for history of multiple myeloma She is here accompanied by her son She is doing well She has gained a lot of weight since last time I saw her She denies bone pain No recent infection, fever or chills  REVIEW OF SYSTEMS:   Constitutional: Denies fevers, chills or abnormal weight loss Eyes: Denies blurriness of vision Ears, nose, mouth, throat, and face: Denies mucositis or sore throat Respiratory: Denies cough, dyspnea or  wheezes Cardiovascular: Denies palpitation, chest discomfort or lower extremity swelling Gastrointestinal:  Denies nausea, heartburn or change in bowel habits Skin: Denies abnormal skin rashes Lymphatics: Denies new lymphadenopathy or easy bruising Neurological:Denies numbness, tingling or new weaknesses Behavioral/Psych: Mood is stable, no new changes  All other systems were reviewed with the patient and are negative.  I have reviewed the past medical history, past surgical history, social history and family history with the patient and they are unchanged from previous note.  ALLERGIES:  is allergic to aspirin, hydrocodone-acetaminophen, morphine, penicillins, betaine, dacarbazine, ketorolac tromethamine, brimonidine, diltiazem hcl, hydrocodone-acetaminophen, neurontin [gabapentin], and sulfacetamide sodium.  MEDICATIONS:  Current Outpatient Medications  Medication Sig Dispense Refill   citalopram (CELEXA) 10 MG tablet Take 10 mg by mouth daily.     acetaminophen (TYLENOL) 500 MG tablet Take 1,000 mg by mouth 2 (two) times daily as needed for mild pain or moderate pain.      acyclovir (ZOVIRAX) 400 MG tablet Take 1 tablet (400 mg total) by mouth daily. 90 tablet 6   allopurinol (ZYLOPRIM) 100 MG tablet Take 0.5 tablets (50 mg total) by mouth daily. 30 tablet 0   cholecalciferol (VITAMIN D3) 25 MCG (1000 UT) tablet Take 2,000 Units by mouth daily.     dexamethasone (DECADRON) 2 MG tablet Take 1 tablet (2 mg total) by mouth daily. 30 tablet 3   dorzolamide (TRUSOPT) 2 % ophthalmic solution Place 1 drop into both eyes 2 (two) times daily.  1   ELIQUIS 2.5 MG TABS tablet TAKE 1 TABLET BY MOUTH TWICE A DAY 60 tablet 5   fluorometholone (FML) 0.1 % ophthalmic suspension Place 1 drop into the  right eye daily.      glimepiride (AMARYL) 1 MG tablet Take 0.5 mg by mouth daily.      lactulose (CHRONULAC) 10 GM/15ML solution TAKE 15 MLS (10 G TOTAL) BY MOUTH 3 (THREE) TIMES DAILY. 473 mL 11    lactulose, encephalopathy, (CHRONULAC) 10 GM/15ML SOLN      lidocaine-prilocaine (EMLA) cream Apply 1 application topically as needed [facility]).      LUMIGAN 0.01 % SOLN Place 1 drop into both eyes at bedtime.     LYRICA 50 MG capsule Take 1 capsule (50 mg total) by mouth at bedtime. 90 capsule 3   metoCLOPramide (REGLAN) 5 MG tablet TAKE 1 TABLET BY MOUTH 3 TIMES DAILY BEFORE MEALS 90 tablet 1   midodrine (PROAMATINE) 10 MG tablet Take 10 mg by mouth as directed. Low bp, and before dialysis     Multiple Vitamin (MULTIVITAMIN) tablet Take 1 tablet by mouth daily.     Nutritional Supplements (,FEEDING SUPPLEMENT, PROSOURCE PLUS) liquid Take 30 mLs by mouth 3 (three) times daily between meals. 1000 mL 0   Nutritional Supplements (FEEDING SUPPLEMENT, NEPRO CARB STEADY,) LIQD Take 237 mLs by mouth 2 (two) times daily between meals. 10000 mL 0   ondansetron (ZOFRAN) 4 MG tablet Take 1 tablet (4 mg total) by mouth every 8 (eight) hours as needed for nausea. 60 tablet 1   oxyCODONE (OXY IR/ROXICODONE) 5 MG immediate release tablet Take 1 tablet (5 mg total) by mouth every 4 (four) hours as needed for severe pain. 60 tablet 0   pantoprazole (PROTONIX) 40 MG tablet TAKE 1 TABLET (40 MG TOTAL) BY MOUTH DAILY AS NEEDED (ACID REFLUX/INDIGESTION.). 90 tablet 3   polyethylene glycol (MIRALAX / GLYCOLAX) 17 g packet Take 17 g by mouth daily.     senna-docusate (SENOKOT-S) 8.6-50 MG tablet Take 2 tablets by mouth 2 (two) times daily. 30 tablet 0   sodium chloride (OCEAN) 0.65 % SOLN nasal spray Place 1 spray into both nostrils 4 (four) times daily as needed for congestion.     No current facility-administered medications for this visit.    SUMMARY OF ONCOLOGIC HISTORY: Oncology History  Multiple myeloma not having achieved remission ([place])  03/03/2019 Initial Diagnosis   Multiple myeloma not having achieved remission ([place])   03/03/2019 Imaging   1. Round and oval lucent/lytic lesions in the skull, bilateral  humeri, distal left femur, and possibly also in the pelvis are compatible with multiple myeloma. No right lower extremity lytic lesion identified. No pathologic fracture. 2. Advanced degenerative changes in the spine with no vertebral myeloma evident by plain radiography. 3. Advanced degenerative changes in the right upper extremity. Bilateral knee arthroplasty. 4.  Aortic Atherosclerosis (ICD10-I70.0).   03/04/2019 -  Chemotherapy   The patient had bortezomib for chemotherapy treatment.    03/04/2019 Bone Marrow Biopsy   Bone Marrow, Aspirate,Biopsy, and Clot - HYPERCELLULAR BONE MARROW FOR AGE WITH PLASMACYTOSIS. - SEE COMMENT. PERIPHERAL BLOOD: - NORMOCYTIC-NORMOCHROMIC ANEMIA. Diagnosis Note The bone marrow is hypercellular for age with increased number of atypical plasma cells averaging 30% of all cells in the aspirate associated with interstitial infiltrates and numerous variably sized aggregates in the clot and biopsy sections. 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 Confirmatory in situ hybridization for kappa and lambda as well as CD138 will be performed and the results reported in an addendum. The background shows trilineage hematopoiesis with mild non-specific changes primarily involving the erythroid series. Correlation with cytogenetic and FISH studies is recommended.  06/20/2019 -  Chemotherapy   The patient had dexamethasone (DECADRON) tablet 20 mg, 20 mg (100 % of original dose 20 mg), Oral,  Once, 19 of 21 cycles Dose modification: 20 mg (original dose 20 mg, Cycle 1), 8 mg (original dose 8 mg, Cycle 10) Administration: 20 mg (06/20/2019), 20 mg (06/27/2019), 20 mg (07/04/2019), 20 mg (07/11/2019), 20 mg (07/18/2019), 20 mg (07/25/2019), 20 mg (08/08/2019), 20 mg (08/15/2019), 20 mg (09/05/2019), 20 mg (12/01/2019), 20 mg (09/26/2019), 20 mg (10/20/2019), 20 mg (11/10/2019), 20 mg (12/22/2019), 20 mg (01/12/2020), 8 mg (02/02/2020), 8 mg (02/23/2020), 8 mg (03/22/2020), 8 mg  (04/12/2020), 8 mg (05/03/2020), 8 mg (05/31/2020), 8 mg (06/23/2020), 8 mg (07/14/2020), 8 mg (08/04/2020), 8 mg (08/25/2020) daratumumab-hyaluronidase-fihj (DARZALEX FASPRO) 1800-30000 MG-UT/15ML chemo SQ injection 1,800 mg, 1,800 mg, Subcutaneous,  Once, 19 of 21 cycles Administration: 1,800 mg (06/20/2019), 1,800 mg (06/27/2019), 1,800 mg (07/04/2019), 1,800 mg (07/11/2019), 1,800 mg (07/18/2019), 1,800 mg (07/25/2019), 1,800 mg (08/08/2019), 1,800 mg (08/15/2019), 1,800 mg (09/05/2019), 1,800 mg (12/01/2019), 1,800 mg (02/02/2020), 1,800 mg (09/26/2019), 1,800 mg (10/20/2019), 1,800 mg (11/10/2019), 1,800 mg (12/22/2019), 1,800 mg (01/12/2020), 1,800 mg (02/23/2020), 1,800 mg (03/22/2020), 1,800 mg (04/12/2020), 1,800 mg (05/03/2020), 1,800 mg (05/31/2020), 1,800 mg (06/23/2020), 1,800 mg (07/14/2020), 1,800 mg (08/04/2020), 1,800 mg (08/25/2020)   for chemotherapy treatment.     01/19/2021 Cancer Staging   Staging form: Plasma Cell Myeloma and Plasma Cell Disorders, AJCC 8th Edition - Clinical stage from 01/19/2021: RISS Stage III (Beta-2-microglobulin (mg/L): 5.6, Albumin (g/dL): 3.2, ISS: Stage III, High-risk cytogenetics: Absent, LDH: Elevated) - Signed by Heath Lark, MD on 01/19/2021 Beta 2 microglobulin range (mg/L): Greater than or equal to 5.5 Albumin range (g/dL): Less than 3.5 Cytogenetics: Other mutation     PHYSICAL EXAMINATION: ECOG PERFORMANCE STATUS: 1 - Symptomatic but completely ambulatory  Vitals:   07/25/21 1204  BP: (!) 127/58  Pulse: 70  Resp: 18  Temp: 98.1 F (36.7 C)  SpO2: 96%   Filed Weights   07/25/21 1204  Weight: 135 lb 1.6 oz (61.3 kg)    GENERAL:alert, no distress and comfortable SKIN: skin color, texture, turgor are normal, no rashes or significant lesions EYES: normal, Conjunctiva are pink and non-injected, sclera clear OROPHARYNX:no exudate, no erythema and lips, buccal mucosa, and tongue normal  NECK: supple, thyroid normal size, non-tender, without nodularity LYMPH:  no  palpable lymphadenopathy in the cervical, axillary or inguinal LUNGS: clear to auscultation and percussion with normal breathing effort HEART: regular rate & rhythm and no murmurs and no lower extremity edema ABDOMEN:abdomen soft, non-tender and normal bowel sounds Musculoskeletal:no cyanosis of digits and no clubbing  NEURO: alert & oriented x 3 with fluent speech, no focal motor/sensory deficits  LABORATORY DATA:  I have reviewed the data as listed    Component Value Date/Time   NA 134 (L) 07/25/2021 1155   K 4.3 07/25/2021 1155   CL 97 (L) 07/25/2021 1155   CO2 23 07/25/2021 1155   GLUCOSE 119 (H) 07/25/2021 1155   BUN 63 (H) 07/25/2021 1155   CREATININE 4.48 (HH) 07/25/2021 1155   CREATININE 2.71 (H) 10/20/2019 1128   CREATININE 1.03 (H) 05/11/2016 0931   CALCIUM 8.8 (L) 07/25/2021 1155   PROT 6.3 (L) 07/25/2021 1155   ALBUMIN 3.6 07/25/2021 1155   AST 22 07/25/2021 1155   AST 18 10/20/2019 1128   ALT 17 07/25/2021 1155   ALT 10 10/20/2019 1128   ALKPHOS 42 07/25/2021 1155   BILITOT 0.4 07/25/2021 1155  BILITOT 0.4 10/20/2019 1128   GFRNONAA 9 (L) 07/25/2021 1155   GFRNONAA 15 (L) 10/20/2019 1128   GFRAA 14 (L) 07/14/2020 1021   GFRAA 18 (L) 10/20/2019 1128    No results found for: SPEP, UPEP  Lab Results  Component Value Date   WBC 7.3 07/25/2021   NEUTROABS 4.9 07/25/2021   HGB 11.3 (L) 07/25/2021   HCT 35.1 (L) 07/25/2021   MCV 100.9 (H) 07/25/2021   PLT 204 07/25/2021      Chemistry      Component Value Date/Time   NA 134 (L) 07/25/2021 1155   K 4.3 07/25/2021 1155   CL 97 (L) 07/25/2021 1155   CO2 23 07/25/2021 1155   BUN 63 (H) 07/25/2021 1155   CREATININE 4.48 (HH) 07/25/2021 1155   CREATININE 2.71 (H) 10/20/2019 1128   CREATININE 1.03 (H) 05/11/2016 0931      Component Value Date/Time   CALCIUM 8.8 (L) 07/25/2021 1155   ALKPHOS 42 07/25/2021 1155   AST 22 07/25/2021 1155   AST 18 10/20/2019 1128   ALT 17 07/25/2021 1155   ALT 10  10/20/2019 1128   BILITOT 0.4 07/25/2021 1155   BILITOT 0.4 10/20/2019 1128

## 2021-07-26 DIAGNOSIS — Z992 Dependence on renal dialysis: Secondary | ICD-10-CM | POA: Diagnosis not present

## 2021-07-26 DIAGNOSIS — N2581 Secondary hyperparathyroidism of renal origin: Secondary | ICD-10-CM | POA: Diagnosis not present

## 2021-07-26 DIAGNOSIS — N186 End stage renal disease: Secondary | ICD-10-CM | POA: Diagnosis not present

## 2021-07-26 LAB — KAPPA/LAMBDA LIGHT CHAINS
Kappa free light chain: 94.8 mg/L — ABNORMAL HIGH (ref 3.3–19.4)
Kappa, lambda light chain ratio: 7.07 — ABNORMAL HIGH (ref 0.26–1.65)
Lambda free light chains: 13.4 mg/L (ref 5.7–26.3)

## 2021-07-28 DIAGNOSIS — Z992 Dependence on renal dialysis: Secondary | ICD-10-CM | POA: Diagnosis not present

## 2021-07-28 DIAGNOSIS — N2581 Secondary hyperparathyroidism of renal origin: Secondary | ICD-10-CM | POA: Diagnosis not present

## 2021-07-28 DIAGNOSIS — N186 End stage renal disease: Secondary | ICD-10-CM | POA: Diagnosis not present

## 2021-07-29 ENCOUNTER — Telehealth: Payer: Self-pay

## 2021-07-29 LAB — MULTIPLE MYELOMA PANEL, SERUM
Albumin SerPl Elph-Mcnc: 3.5 g/dL (ref 2.9–4.4)
Albumin/Glob SerPl: 1.6 (ref 0.7–1.7)
Alpha 1: 0.2 g/dL (ref 0.0–0.4)
Alpha2 Glob SerPl Elph-Mcnc: 0.7 g/dL (ref 0.4–1.0)
B-Globulin SerPl Elph-Mcnc: 1 g/dL (ref 0.7–1.3)
Gamma Glob SerPl Elph-Mcnc: 0.3 g/dL — ABNORMAL LOW (ref 0.4–1.8)
Globulin, Total: 2.2 g/dL (ref 2.2–3.9)
IgA: 23 mg/dL — ABNORMAL LOW (ref 64–422)
IgG (Immunoglobin G), Serum: 632 mg/dL (ref 586–1602)
IgM (Immunoglobulin M), Srm: 12 mg/dL — ABNORMAL LOW (ref 26–217)
M Protein SerPl Elph-Mcnc: 0.2 g/dL — ABNORMAL HIGH
Total Protein ELP: 5.7 g/dL — ABNORMAL LOW (ref 6.0–8.5)

## 2021-07-29 NOTE — Telephone Encounter (Signed)
Called and given below message. Son verbalized understanding. °

## 2021-07-29 NOTE — Telephone Encounter (Signed)
-----   Message from Heath Lark, MD sent at 07/29/2021  3:07 PM EDT ----- Pls call his son Myeloma panel is stable, monitor closely

## 2021-07-30 DIAGNOSIS — Z992 Dependence on renal dialysis: Secondary | ICD-10-CM | POA: Diagnosis not present

## 2021-07-30 DIAGNOSIS — N2581 Secondary hyperparathyroidism of renal origin: Secondary | ICD-10-CM | POA: Diagnosis not present

## 2021-07-30 DIAGNOSIS — N186 End stage renal disease: Secondary | ICD-10-CM | POA: Diagnosis not present

## 2021-08-02 DIAGNOSIS — N186 End stage renal disease: Secondary | ICD-10-CM | POA: Diagnosis not present

## 2021-08-02 DIAGNOSIS — Z992 Dependence on renal dialysis: Secondary | ICD-10-CM | POA: Diagnosis not present

## 2021-08-02 DIAGNOSIS — N2581 Secondary hyperparathyroidism of renal origin: Secondary | ICD-10-CM | POA: Diagnosis not present

## 2021-08-04 DIAGNOSIS — Z992 Dependence on renal dialysis: Secondary | ICD-10-CM | POA: Diagnosis not present

## 2021-08-04 DIAGNOSIS — N2581 Secondary hyperparathyroidism of renal origin: Secondary | ICD-10-CM | POA: Diagnosis not present

## 2021-08-04 DIAGNOSIS — N186 End stage renal disease: Secondary | ICD-10-CM | POA: Diagnosis not present

## 2021-08-05 ENCOUNTER — Ambulatory Visit (INDEPENDENT_AMBULATORY_CARE_PROVIDER_SITE_OTHER): Payer: Medicare PPO

## 2021-08-05 DIAGNOSIS — I495 Sick sinus syndrome: Secondary | ICD-10-CM

## 2021-08-06 DIAGNOSIS — N2581 Secondary hyperparathyroidism of renal origin: Secondary | ICD-10-CM | POA: Diagnosis not present

## 2021-08-06 DIAGNOSIS — Z992 Dependence on renal dialysis: Secondary | ICD-10-CM | POA: Diagnosis not present

## 2021-08-06 DIAGNOSIS — N186 End stage renal disease: Secondary | ICD-10-CM | POA: Diagnosis not present

## 2021-08-07 LAB — CUP PACEART REMOTE DEVICE CHECK
Battery Remaining Longevity: 32 mo
Battery Remaining Percentage: 35 %
Battery Voltage: 2.95 V
Brady Statistic AP VP Percent: 72 %
Brady Statistic AP VS Percent: 22 %
Brady Statistic AS VP Percent: 2.8 %
Brady Statistic AS VS Percent: 3.7 %
Brady Statistic RA Percent Paced: 92 %
Brady Statistic RV Percent Paced: 74 %
Date Time Interrogation Session: 20221021020029
Implantable Lead Implant Date: 20100422
Implantable Lead Implant Date: 20100422
Implantable Lead Location: 753859
Implantable Lead Location: 753860
Implantable Pulse Generator Implant Date: 20170801
Lead Channel Impedance Value: 430 Ohm
Lead Channel Impedance Value: 450 Ohm
Lead Channel Pacing Threshold Amplitude: 0.5 V
Lead Channel Pacing Threshold Amplitude: 0.75 V
Lead Channel Pacing Threshold Pulse Width: 0.5 ms
Lead Channel Pacing Threshold Pulse Width: 0.5 ms
Lead Channel Sensing Intrinsic Amplitude: 12 mV
Lead Channel Sensing Intrinsic Amplitude: 3 mV
Lead Channel Setting Pacing Amplitude: 2 V
Lead Channel Setting Pacing Amplitude: 2.5 V
Lead Channel Setting Pacing Pulse Width: 0.5 ms
Lead Channel Setting Sensing Sensitivity: 2 mV
Pulse Gen Model: 2272
Pulse Gen Serial Number: 7929552

## 2021-08-08 DIAGNOSIS — N186 End stage renal disease: Secondary | ICD-10-CM | POA: Diagnosis not present

## 2021-08-08 DIAGNOSIS — Z992 Dependence on renal dialysis: Secondary | ICD-10-CM | POA: Diagnosis not present

## 2021-08-08 DIAGNOSIS — T82858A Stenosis of vascular prosthetic devices, implants and grafts, initial encounter: Secondary | ICD-10-CM | POA: Diagnosis not present

## 2021-08-08 DIAGNOSIS — I871 Compression of vein: Secondary | ICD-10-CM | POA: Diagnosis not present

## 2021-08-09 DIAGNOSIS — N186 End stage renal disease: Secondary | ICD-10-CM | POA: Diagnosis not present

## 2021-08-09 DIAGNOSIS — N2581 Secondary hyperparathyroidism of renal origin: Secondary | ICD-10-CM | POA: Diagnosis not present

## 2021-08-09 DIAGNOSIS — Z992 Dependence on renal dialysis: Secondary | ICD-10-CM | POA: Diagnosis not present

## 2021-08-11 DIAGNOSIS — N186 End stage renal disease: Secondary | ICD-10-CM | POA: Diagnosis not present

## 2021-08-11 DIAGNOSIS — N2581 Secondary hyperparathyroidism of renal origin: Secondary | ICD-10-CM | POA: Diagnosis not present

## 2021-08-11 DIAGNOSIS — Z992 Dependence on renal dialysis: Secondary | ICD-10-CM | POA: Diagnosis not present

## 2021-08-13 DIAGNOSIS — N2581 Secondary hyperparathyroidism of renal origin: Secondary | ICD-10-CM | POA: Diagnosis not present

## 2021-08-13 DIAGNOSIS — Z992 Dependence on renal dialysis: Secondary | ICD-10-CM | POA: Diagnosis not present

## 2021-08-13 DIAGNOSIS — N186 End stage renal disease: Secondary | ICD-10-CM | POA: Diagnosis not present

## 2021-08-14 ENCOUNTER — Other Ambulatory Visit: Payer: Self-pay | Admitting: Hematology and Oncology

## 2021-08-15 ENCOUNTER — Encounter: Payer: Self-pay | Admitting: Hematology and Oncology

## 2021-08-15 DIAGNOSIS — N186 End stage renal disease: Secondary | ICD-10-CM | POA: Diagnosis not present

## 2021-08-15 DIAGNOSIS — E1122 Type 2 diabetes mellitus with diabetic chronic kidney disease: Secondary | ICD-10-CM | POA: Diagnosis not present

## 2021-08-15 DIAGNOSIS — Z992 Dependence on renal dialysis: Secondary | ICD-10-CM | POA: Diagnosis not present

## 2021-08-15 NOTE — Progress Notes (Signed)
Remote pacemaker transmission.   

## 2021-08-16 DIAGNOSIS — N186 End stage renal disease: Secondary | ICD-10-CM | POA: Diagnosis not present

## 2021-08-16 DIAGNOSIS — N2581 Secondary hyperparathyroidism of renal origin: Secondary | ICD-10-CM | POA: Diagnosis not present

## 2021-08-16 DIAGNOSIS — Z992 Dependence on renal dialysis: Secondary | ICD-10-CM | POA: Diagnosis not present

## 2021-08-17 ENCOUNTER — Encounter: Payer: Self-pay | Admitting: Podiatry

## 2021-08-17 ENCOUNTER — Other Ambulatory Visit: Payer: Self-pay

## 2021-08-17 ENCOUNTER — Ambulatory Visit: Payer: Medicare PPO | Admitting: Podiatry

## 2021-08-17 DIAGNOSIS — M79674 Pain in right toe(s): Secondary | ICD-10-CM

## 2021-08-17 DIAGNOSIS — E1159 Type 2 diabetes mellitus with other circulatory complications: Secondary | ICD-10-CM

## 2021-08-17 DIAGNOSIS — N186 End stage renal disease: Secondary | ICD-10-CM | POA: Diagnosis not present

## 2021-08-17 DIAGNOSIS — T451X5A Adverse effect of antineoplastic and immunosuppressive drugs, initial encounter: Secondary | ICD-10-CM

## 2021-08-17 DIAGNOSIS — G894 Chronic pain syndrome: Secondary | ICD-10-CM | POA: Diagnosis not present

## 2021-08-17 DIAGNOSIS — D689 Coagulation defect, unspecified: Secondary | ICD-10-CM | POA: Diagnosis not present

## 2021-08-17 DIAGNOSIS — B351 Tinea unguium: Secondary | ICD-10-CM | POA: Diagnosis not present

## 2021-08-17 DIAGNOSIS — Z992 Dependence on renal dialysis: Secondary | ICD-10-CM | POA: Diagnosis not present

## 2021-08-17 DIAGNOSIS — M79675 Pain in left toe(s): Secondary | ICD-10-CM

## 2021-08-17 DIAGNOSIS — M1612 Unilateral primary osteoarthritis, left hip: Secondary | ICD-10-CM | POA: Diagnosis not present

## 2021-08-17 DIAGNOSIS — M25512 Pain in left shoulder: Secondary | ICD-10-CM | POA: Diagnosis not present

## 2021-08-17 DIAGNOSIS — Z79891 Long term (current) use of opiate analgesic: Secondary | ICD-10-CM | POA: Diagnosis not present

## 2021-08-17 DIAGNOSIS — G620 Drug-induced polyneuropathy: Secondary | ICD-10-CM

## 2021-08-17 DIAGNOSIS — M7060 Trochanteric bursitis, unspecified hip: Secondary | ICD-10-CM | POA: Diagnosis not present

## 2021-08-17 NOTE — Progress Notes (Signed)
This patient returns to my office for at risk foot care.  This patient requires this care by a professional since this patient will be at risk due to having peripheral neuropathy, ESRD and coagulation defect due to eliquis.  Patient is also diabetic.  This patient is unable to cut nails herself since the patient cannot reach her nails.These nails are painful walking and wearing shoes.  This patient presents for at risk foot care today.  She presents to the office with her son.  General Appearance  Alert, conversant and in no acute stress.  Vascular  Dorsalis pedis and posterior tibial  pulses are weakly  palpable  bilaterally.  Capillary return is within normal limits  bilaterally. Cold feet   Bilaterally.  Digital hair absent.  Neurologic  Senn-Weinstein monofilament wire test within normal limits  bilaterally. Muscle power within normal limits bilaterally.  Nails Thick disfigured discolored nails with subungual debris  from hallux to fifth toes bilaterally. No evidence of bacterial infection or drainage bilaterally.  Orthopedic  No limitations of motion  feet .  No crepitus or effusions noted.  No bony pathology or digital deformities noted. PTTD right foot.  Exostosis midfoot left 3rd MCJ and 1st MCJ  Right.  DJD 1st MPJ  Right foot.  Hallux varus 1st MPJ  Left foot.  Skin  normotropic skin with no porokeratosis noted bilaterally.  No signs of infections or ulcers noted.     Onychomycosis  Pain in right toes  Pain in left toes  Consent was obtained for treatment procedures.   Mechanical debridement of nails 1-5  bilaterally performed with a nail nipper.  Filed with dremel without incident.    Return office visit   4 months                  Told patient to return for periodic foot care and evaluation due to potential at risk complications.   Gardiner Barefoot DPM

## 2021-08-18 DIAGNOSIS — N186 End stage renal disease: Secondary | ICD-10-CM | POA: Diagnosis not present

## 2021-08-18 DIAGNOSIS — Z992 Dependence on renal dialysis: Secondary | ICD-10-CM | POA: Diagnosis not present

## 2021-08-18 DIAGNOSIS — N2581 Secondary hyperparathyroidism of renal origin: Secondary | ICD-10-CM | POA: Diagnosis not present

## 2021-08-19 DIAGNOSIS — C9000 Multiple myeloma not having achieved remission: Secondary | ICD-10-CM | POA: Diagnosis not present

## 2021-08-20 DIAGNOSIS — N186 End stage renal disease: Secondary | ICD-10-CM | POA: Diagnosis not present

## 2021-08-20 DIAGNOSIS — N2581 Secondary hyperparathyroidism of renal origin: Secondary | ICD-10-CM | POA: Diagnosis not present

## 2021-08-20 DIAGNOSIS — Z992 Dependence on renal dialysis: Secondary | ICD-10-CM | POA: Diagnosis not present

## 2021-08-23 DIAGNOSIS — Z992 Dependence on renal dialysis: Secondary | ICD-10-CM | POA: Diagnosis not present

## 2021-08-23 DIAGNOSIS — N2581 Secondary hyperparathyroidism of renal origin: Secondary | ICD-10-CM | POA: Diagnosis not present

## 2021-08-23 DIAGNOSIS — N186 End stage renal disease: Secondary | ICD-10-CM | POA: Diagnosis not present

## 2021-08-24 DIAGNOSIS — H579 Unspecified disorder of eye and adnexa: Secondary | ICD-10-CM | POA: Diagnosis not present

## 2021-08-24 DIAGNOSIS — R419 Unspecified symptoms and signs involving cognitive functions and awareness: Secondary | ICD-10-CM | POA: Diagnosis not present

## 2021-08-24 DIAGNOSIS — M545 Low back pain, unspecified: Secondary | ICD-10-CM | POA: Diagnosis not present

## 2021-08-25 DIAGNOSIS — N186 End stage renal disease: Secondary | ICD-10-CM | POA: Diagnosis not present

## 2021-08-25 DIAGNOSIS — Z992 Dependence on renal dialysis: Secondary | ICD-10-CM | POA: Diagnosis not present

## 2021-08-25 DIAGNOSIS — N2581 Secondary hyperparathyroidism of renal origin: Secondary | ICD-10-CM | POA: Diagnosis not present

## 2021-08-26 DIAGNOSIS — M542 Cervicalgia: Secondary | ICD-10-CM | POA: Diagnosis not present

## 2021-08-26 DIAGNOSIS — M4692 Unspecified inflammatory spondylopathy, cervical region: Secondary | ICD-10-CM | POA: Diagnosis not present

## 2021-08-26 DIAGNOSIS — M19012 Primary osteoarthritis, left shoulder: Secondary | ICD-10-CM | POA: Diagnosis not present

## 2021-08-27 DIAGNOSIS — N2581 Secondary hyperparathyroidism of renal origin: Secondary | ICD-10-CM | POA: Diagnosis not present

## 2021-08-27 DIAGNOSIS — Z992 Dependence on renal dialysis: Secondary | ICD-10-CM | POA: Diagnosis not present

## 2021-08-27 DIAGNOSIS — N186 End stage renal disease: Secondary | ICD-10-CM | POA: Diagnosis not present

## 2021-08-30 DIAGNOSIS — N186 End stage renal disease: Secondary | ICD-10-CM | POA: Diagnosis not present

## 2021-08-30 DIAGNOSIS — N2581 Secondary hyperparathyroidism of renal origin: Secondary | ICD-10-CM | POA: Diagnosis not present

## 2021-08-30 DIAGNOSIS — Z992 Dependence on renal dialysis: Secondary | ICD-10-CM | POA: Diagnosis not present

## 2021-08-31 DIAGNOSIS — M19012 Primary osteoarthritis, left shoulder: Secondary | ICD-10-CM | POA: Diagnosis not present

## 2021-08-31 DIAGNOSIS — R531 Weakness: Secondary | ICD-10-CM | POA: Diagnosis not present

## 2021-08-31 DIAGNOSIS — M25612 Stiffness of left shoulder, not elsewhere classified: Secondary | ICD-10-CM | POA: Diagnosis not present

## 2021-08-31 DIAGNOSIS — M47892 Other spondylosis, cervical region: Secondary | ICD-10-CM | POA: Diagnosis not present

## 2021-09-01 ENCOUNTER — Other Ambulatory Visit: Payer: Self-pay | Admitting: Hematology and Oncology

## 2021-09-01 DIAGNOSIS — N186 End stage renal disease: Secondary | ICD-10-CM | POA: Diagnosis not present

## 2021-09-01 DIAGNOSIS — Z992 Dependence on renal dialysis: Secondary | ICD-10-CM | POA: Diagnosis not present

## 2021-09-01 DIAGNOSIS — N2581 Secondary hyperparathyroidism of renal origin: Secondary | ICD-10-CM | POA: Diagnosis not present

## 2021-09-03 DIAGNOSIS — Z992 Dependence on renal dialysis: Secondary | ICD-10-CM | POA: Diagnosis not present

## 2021-09-03 DIAGNOSIS — N186 End stage renal disease: Secondary | ICD-10-CM | POA: Diagnosis not present

## 2021-09-03 DIAGNOSIS — N2581 Secondary hyperparathyroidism of renal origin: Secondary | ICD-10-CM | POA: Diagnosis not present

## 2021-09-05 ENCOUNTER — Telehealth: Payer: Self-pay

## 2021-09-05 ENCOUNTER — Encounter: Payer: Self-pay | Admitting: Hematology and Oncology

## 2021-09-05 NOTE — Telephone Encounter (Signed)
Received call from patient's son, [name], that the pharmacy was not processing the patient's Lyrica prescription. Called and spoke with pharmacy and issue has since been resolved. Patient's medication is currently being processed to be shipped.  Called son back to update him on the situation. No further questions or concerns at this time.

## 2021-09-06 ENCOUNTER — Other Ambulatory Visit: Payer: Self-pay | Admitting: Hematology and Oncology

## 2021-09-06 DIAGNOSIS — N186 End stage renal disease: Secondary | ICD-10-CM | POA: Diagnosis not present

## 2021-09-06 DIAGNOSIS — Z992 Dependence on renal dialysis: Secondary | ICD-10-CM | POA: Diagnosis not present

## 2021-09-06 DIAGNOSIS — N2581 Secondary hyperparathyroidism of renal origin: Secondary | ICD-10-CM | POA: Diagnosis not present

## 2021-09-06 NOTE — Telephone Encounter (Signed)
I received this refill request of dexamethasone Per previous discussion with her son, we are attempting to wean her off Was she able to stop dexamethasone? If not, would 1 mg makes sense instead of 2 mg?

## 2021-09-09 DIAGNOSIS — Z992 Dependence on renal dialysis: Secondary | ICD-10-CM | POA: Diagnosis not present

## 2021-09-09 DIAGNOSIS — N186 End stage renal disease: Secondary | ICD-10-CM | POA: Diagnosis not present

## 2021-09-09 DIAGNOSIS — N2581 Secondary hyperparathyroidism of renal origin: Secondary | ICD-10-CM | POA: Diagnosis not present

## 2021-09-12 DIAGNOSIS — M19012 Primary osteoarthritis, left shoulder: Secondary | ICD-10-CM | POA: Diagnosis not present

## 2021-09-12 DIAGNOSIS — R531 Weakness: Secondary | ICD-10-CM | POA: Diagnosis not present

## 2021-09-12 DIAGNOSIS — M47892 Other spondylosis, cervical region: Secondary | ICD-10-CM | POA: Diagnosis not present

## 2021-09-12 DIAGNOSIS — M25612 Stiffness of left shoulder, not elsewhere classified: Secondary | ICD-10-CM | POA: Diagnosis not present

## 2021-09-13 DIAGNOSIS — Z992 Dependence on renal dialysis: Secondary | ICD-10-CM | POA: Diagnosis not present

## 2021-09-13 DIAGNOSIS — N2581 Secondary hyperparathyroidism of renal origin: Secondary | ICD-10-CM | POA: Diagnosis not present

## 2021-09-13 DIAGNOSIS — N186 End stage renal disease: Secondary | ICD-10-CM | POA: Diagnosis not present

## 2021-09-14 DIAGNOSIS — N186 End stage renal disease: Secondary | ICD-10-CM | POA: Diagnosis not present

## 2021-09-14 DIAGNOSIS — Z992 Dependence on renal dialysis: Secondary | ICD-10-CM | POA: Diagnosis not present

## 2021-09-14 DIAGNOSIS — E1122 Type 2 diabetes mellitus with diabetic chronic kidney disease: Secondary | ICD-10-CM | POA: Diagnosis not present

## 2021-09-15 DIAGNOSIS — N2581 Secondary hyperparathyroidism of renal origin: Secondary | ICD-10-CM | POA: Diagnosis not present

## 2021-09-15 DIAGNOSIS — N186 End stage renal disease: Secondary | ICD-10-CM | POA: Diagnosis not present

## 2021-09-15 DIAGNOSIS — Z992 Dependence on renal dialysis: Secondary | ICD-10-CM | POA: Diagnosis not present

## 2021-09-17 DIAGNOSIS — Z992 Dependence on renal dialysis: Secondary | ICD-10-CM | POA: Diagnosis not present

## 2021-09-17 DIAGNOSIS — N186 End stage renal disease: Secondary | ICD-10-CM | POA: Diagnosis not present

## 2021-09-17 DIAGNOSIS — N2581 Secondary hyperparathyroidism of renal origin: Secondary | ICD-10-CM | POA: Diagnosis not present

## 2021-09-20 DIAGNOSIS — N2581 Secondary hyperparathyroidism of renal origin: Secondary | ICD-10-CM | POA: Diagnosis not present

## 2021-09-20 DIAGNOSIS — Z992 Dependence on renal dialysis: Secondary | ICD-10-CM | POA: Diagnosis not present

## 2021-09-20 DIAGNOSIS — N186 End stage renal disease: Secondary | ICD-10-CM | POA: Diagnosis not present

## 2021-09-22 DIAGNOSIS — Z992 Dependence on renal dialysis: Secondary | ICD-10-CM | POA: Diagnosis not present

## 2021-09-22 DIAGNOSIS — N2581 Secondary hyperparathyroidism of renal origin: Secondary | ICD-10-CM | POA: Diagnosis not present

## 2021-09-22 DIAGNOSIS — N186 End stage renal disease: Secondary | ICD-10-CM | POA: Diagnosis not present

## 2021-09-24 DIAGNOSIS — Z992 Dependence on renal dialysis: Secondary | ICD-10-CM | POA: Diagnosis not present

## 2021-09-24 DIAGNOSIS — N2581 Secondary hyperparathyroidism of renal origin: Secondary | ICD-10-CM | POA: Diagnosis not present

## 2021-09-24 DIAGNOSIS — N186 End stage renal disease: Secondary | ICD-10-CM | POA: Diagnosis not present

## 2021-09-26 DIAGNOSIS — M7060 Trochanteric bursitis, unspecified hip: Secondary | ICD-10-CM | POA: Diagnosis not present

## 2021-09-26 DIAGNOSIS — G894 Chronic pain syndrome: Secondary | ICD-10-CM | POA: Diagnosis not present

## 2021-09-26 DIAGNOSIS — M25519 Pain in unspecified shoulder: Secondary | ICD-10-CM | POA: Diagnosis not present

## 2021-09-26 DIAGNOSIS — M542 Cervicalgia: Secondary | ICD-10-CM | POA: Diagnosis not present

## 2021-09-26 DIAGNOSIS — M4727 Other spondylosis with radiculopathy, lumbosacral region: Secondary | ICD-10-CM | POA: Diagnosis not present

## 2021-09-26 DIAGNOSIS — M25512 Pain in left shoulder: Secondary | ICD-10-CM | POA: Diagnosis not present

## 2021-09-27 DIAGNOSIS — N186 End stage renal disease: Secondary | ICD-10-CM | POA: Diagnosis not present

## 2021-09-27 DIAGNOSIS — N2581 Secondary hyperparathyroidism of renal origin: Secondary | ICD-10-CM | POA: Diagnosis not present

## 2021-09-27 DIAGNOSIS — Z992 Dependence on renal dialysis: Secondary | ICD-10-CM | POA: Diagnosis not present

## 2021-09-28 ENCOUNTER — Other Ambulatory Visit: Payer: Self-pay | Admitting: Hematology and Oncology

## 2021-09-29 DIAGNOSIS — N2581 Secondary hyperparathyroidism of renal origin: Secondary | ICD-10-CM | POA: Diagnosis not present

## 2021-09-29 DIAGNOSIS — Z992 Dependence on renal dialysis: Secondary | ICD-10-CM | POA: Diagnosis not present

## 2021-09-29 DIAGNOSIS — N186 End stage renal disease: Secondary | ICD-10-CM | POA: Diagnosis not present

## 2021-10-01 DIAGNOSIS — Z992 Dependence on renal dialysis: Secondary | ICD-10-CM | POA: Diagnosis not present

## 2021-10-01 DIAGNOSIS — N186 End stage renal disease: Secondary | ICD-10-CM | POA: Diagnosis not present

## 2021-10-01 DIAGNOSIS — N2581 Secondary hyperparathyroidism of renal origin: Secondary | ICD-10-CM | POA: Diagnosis not present

## 2021-10-04 DIAGNOSIS — N2581 Secondary hyperparathyroidism of renal origin: Secondary | ICD-10-CM | POA: Diagnosis not present

## 2021-10-04 DIAGNOSIS — Z992 Dependence on renal dialysis: Secondary | ICD-10-CM | POA: Diagnosis not present

## 2021-10-04 DIAGNOSIS — N186 End stage renal disease: Secondary | ICD-10-CM | POA: Diagnosis not present

## 2021-10-06 DIAGNOSIS — Z992 Dependence on renal dialysis: Secondary | ICD-10-CM | POA: Diagnosis not present

## 2021-10-06 DIAGNOSIS — N2581 Secondary hyperparathyroidism of renal origin: Secondary | ICD-10-CM | POA: Diagnosis not present

## 2021-10-06 DIAGNOSIS — N186 End stage renal disease: Secondary | ICD-10-CM | POA: Diagnosis not present

## 2021-10-08 DIAGNOSIS — N186 End stage renal disease: Secondary | ICD-10-CM | POA: Diagnosis not present

## 2021-10-08 DIAGNOSIS — N2581 Secondary hyperparathyroidism of renal origin: Secondary | ICD-10-CM | POA: Diagnosis not present

## 2021-10-08 DIAGNOSIS — Z992 Dependence on renal dialysis: Secondary | ICD-10-CM | POA: Diagnosis not present

## 2021-10-11 ENCOUNTER — Inpatient Hospital Stay (HOSPITAL_COMMUNITY)
Admission: EM | Admit: 2021-10-11 | Discharge: 2021-10-14 | DRG: 542 | Disposition: A | Discharge status: Home Health | Payer: Medicare PPO | Attending: Internal Medicine | Admitting: Internal Medicine

## 2021-10-11 ENCOUNTER — Emergency Department (HOSPITAL_COMMUNITY): Payer: Medicare PPO

## 2021-10-11 ENCOUNTER — Observation Stay (HOSPITAL_COMMUNITY): Payer: Medicare PPO

## 2021-10-11 ENCOUNTER — Encounter (HOSPITAL_COMMUNITY): Payer: Self-pay | Admitting: Internal Medicine

## 2021-10-11 ENCOUNTER — Other Ambulatory Visit: Payer: Self-pay

## 2021-10-11 DIAGNOSIS — I4819 Other persistent atrial fibrillation: Secondary | ICD-10-CM | POA: Diagnosis not present

## 2021-10-11 DIAGNOSIS — D631 Anemia in chronic kidney disease: Secondary | ICD-10-CM | POA: Diagnosis present

## 2021-10-11 DIAGNOSIS — Z885 Allergy status to narcotic agent status: Secondary | ICD-10-CM

## 2021-10-11 DIAGNOSIS — E119 Type 2 diabetes mellitus without complications: Secondary | ICD-10-CM

## 2021-10-11 DIAGNOSIS — I120 Hypertensive chronic kidney disease with stage 5 chronic kidney disease or end stage renal disease: Secondary | ICD-10-CM | POA: Diagnosis not present

## 2021-10-11 DIAGNOSIS — M546 Pain in thoracic spine: Secondary | ICD-10-CM

## 2021-10-11 DIAGNOSIS — C9000 Multiple myeloma not having achieved remission: Secondary | ICD-10-CM | POA: Diagnosis not present

## 2021-10-11 DIAGNOSIS — R262 Difficulty in walking, not elsewhere classified: Secondary | ICD-10-CM | POA: Diagnosis present

## 2021-10-11 DIAGNOSIS — Z8249 Family history of ischemic heart disease and other diseases of the circulatory system: Secondary | ICD-10-CM

## 2021-10-11 DIAGNOSIS — R54 Age-related physical debility: Secondary | ICD-10-CM | POA: Diagnosis present

## 2021-10-11 DIAGNOSIS — R269 Unspecified abnormalities of gait and mobility: Secondary | ICD-10-CM | POA: Diagnosis not present

## 2021-10-11 DIAGNOSIS — R7989 Other specified abnormal findings of blood chemistry: Secondary | ICD-10-CM | POA: Diagnosis present

## 2021-10-11 DIAGNOSIS — I4892 Unspecified atrial flutter: Secondary | ICD-10-CM | POA: Diagnosis present

## 2021-10-11 DIAGNOSIS — R778 Other specified abnormalities of plasma proteins: Secondary | ICD-10-CM | POA: Diagnosis present

## 2021-10-11 DIAGNOSIS — R079 Chest pain, unspecified: Secondary | ICD-10-CM | POA: Diagnosis not present

## 2021-10-11 DIAGNOSIS — G893 Neoplasm related pain (acute) (chronic): Secondary | ICD-10-CM | POA: Diagnosis present

## 2021-10-11 DIAGNOSIS — M4316 Spondylolisthesis, lumbar region: Secondary | ICD-10-CM | POA: Diagnosis not present

## 2021-10-11 DIAGNOSIS — I132 Hypertensive heart and chronic kidney disease with heart failure and with stage 5 chronic kidney disease, or end stage renal disease: Secondary | ICD-10-CM | POA: Diagnosis present

## 2021-10-11 DIAGNOSIS — Z7901 Long term (current) use of anticoagulants: Secondary | ICD-10-CM | POA: Diagnosis not present

## 2021-10-11 DIAGNOSIS — Z66 Do not resuscitate: Secondary | ICD-10-CM | POA: Diagnosis not present

## 2021-10-11 DIAGNOSIS — Z20822 Contact with and (suspected) exposure to covid-19: Secondary | ICD-10-CM | POA: Diagnosis present

## 2021-10-11 DIAGNOSIS — I5020 Unspecified systolic (congestive) heart failure: Secondary | ICD-10-CM | POA: Diagnosis not present

## 2021-10-11 DIAGNOSIS — I429 Cardiomyopathy, unspecified: Secondary | ICD-10-CM | POA: Diagnosis not present

## 2021-10-11 DIAGNOSIS — Z803 Family history of malignant neoplasm of breast: Secondary | ICD-10-CM

## 2021-10-11 DIAGNOSIS — I959 Hypotension, unspecified: Secondary | ICD-10-CM | POA: Diagnosis present

## 2021-10-11 DIAGNOSIS — G4733 Obstructive sleep apnea (adult) (pediatric): Secondary | ICD-10-CM | POA: Diagnosis present

## 2021-10-11 DIAGNOSIS — R2689 Other abnormalities of gait and mobility: Secondary | ICD-10-CM | POA: Diagnosis present

## 2021-10-11 DIAGNOSIS — K5900 Constipation, unspecified: Secondary | ICD-10-CM | POA: Diagnosis present

## 2021-10-11 DIAGNOSIS — Z886 Allergy status to analgesic agent status: Secondary | ICD-10-CM

## 2021-10-11 DIAGNOSIS — M545 Low back pain, unspecified: Secondary | ICD-10-CM | POA: Diagnosis not present

## 2021-10-11 DIAGNOSIS — M549 Dorsalgia, unspecified: Secondary | ICD-10-CM | POA: Diagnosis not present

## 2021-10-11 DIAGNOSIS — M109 Gout, unspecified: Secondary | ICD-10-CM | POA: Diagnosis present

## 2021-10-11 DIAGNOSIS — I5042 Chronic combined systolic (congestive) and diastolic (congestive) heart failure: Secondary | ICD-10-CM | POA: Diagnosis present

## 2021-10-11 DIAGNOSIS — Z7952 Long term (current) use of systemic steroids: Secondary | ICD-10-CM

## 2021-10-11 DIAGNOSIS — Z807 Family history of other malignant neoplasms of lymphoid, hematopoietic and related tissues: Secondary | ICD-10-CM

## 2021-10-11 DIAGNOSIS — R2681 Unsteadiness on feet: Secondary | ICD-10-CM

## 2021-10-11 DIAGNOSIS — E1122 Type 2 diabetes mellitus with diabetic chronic kidney disease: Secondary | ICD-10-CM | POA: Diagnosis present

## 2021-10-11 DIAGNOSIS — N186 End stage renal disease: Secondary | ICD-10-CM | POA: Diagnosis not present

## 2021-10-11 DIAGNOSIS — I495 Sick sinus syndrome: Secondary | ICD-10-CM | POA: Diagnosis present

## 2021-10-11 DIAGNOSIS — N250 Renal osteodystrophy: Secondary | ICD-10-CM | POA: Diagnosis not present

## 2021-10-11 DIAGNOSIS — G8929 Other chronic pain: Secondary | ICD-10-CM

## 2021-10-11 DIAGNOSIS — Z882 Allergy status to sulfonamides status: Secondary | ICD-10-CM

## 2021-10-11 DIAGNOSIS — Z9181 History of falling: Secondary | ICD-10-CM

## 2021-10-11 DIAGNOSIS — Z800 Family history of malignant neoplasm of digestive organs: Secondary | ICD-10-CM

## 2021-10-11 DIAGNOSIS — F32A Depression, unspecified: Secondary | ICD-10-CM | POA: Diagnosis present

## 2021-10-11 DIAGNOSIS — Z8261 Family history of arthritis: Secondary | ICD-10-CM

## 2021-10-11 DIAGNOSIS — Z950 Presence of cardiac pacemaker: Secondary | ICD-10-CM

## 2021-10-11 DIAGNOSIS — K219 Gastro-esophageal reflux disease without esophagitis: Secondary | ICD-10-CM | POA: Diagnosis present

## 2021-10-11 DIAGNOSIS — R531 Weakness: Secondary | ICD-10-CM | POA: Diagnosis not present

## 2021-10-11 DIAGNOSIS — Z888 Allergy status to other drugs, medicaments and biological substances status: Secondary | ICD-10-CM

## 2021-10-11 DIAGNOSIS — Z992 Dependence on renal dialysis: Secondary | ICD-10-CM

## 2021-10-11 DIAGNOSIS — M4126 Other idiopathic scoliosis, lumbar region: Secondary | ICD-10-CM | POA: Diagnosis not present

## 2021-10-11 DIAGNOSIS — C7951 Secondary malignant neoplasm of bone: Secondary | ICD-10-CM | POA: Diagnosis not present

## 2021-10-11 DIAGNOSIS — H409 Unspecified glaucoma: Secondary | ICD-10-CM | POA: Diagnosis present

## 2021-10-11 DIAGNOSIS — Z880 Allergy status to penicillin: Secondary | ICD-10-CM

## 2021-10-11 DIAGNOSIS — I517 Cardiomegaly: Secondary | ICD-10-CM | POA: Diagnosis not present

## 2021-10-11 LAB — RESP PANEL BY RT-PCR (FLU A&B, COVID) ARPGX2
Influenza A by PCR: NEGATIVE
Influenza B by PCR: NEGATIVE
SARS Coronavirus 2 by RT PCR: NEGATIVE

## 2021-10-11 LAB — HEPATITIS B SURFACE ANTIGEN: Hepatitis B Surface Ag: NONREACTIVE

## 2021-10-11 LAB — COMPREHENSIVE METABOLIC PANEL
ALT: 17 U/L (ref 0–44)
AST: 20 U/L (ref 15–41)
Albumin: 3.2 g/dL — ABNORMAL LOW (ref 3.5–5.0)
Alkaline Phosphatase: 32 U/L — ABNORMAL LOW (ref 38–126)
Anion gap: 16 — ABNORMAL HIGH (ref 5–15)
BUN: 87 mg/dL — ABNORMAL HIGH (ref 8–23)
CO2: 26 mmol/L (ref 22–32)
Calcium: 9.3 mg/dL (ref 8.9–10.3)
Chloride: 89 mmol/L — ABNORMAL LOW (ref 98–111)
Creatinine, Ser: 7.86 mg/dL — ABNORMAL HIGH (ref 0.44–1.00)
GFR, Estimated: 5 mL/min — ABNORMAL LOW (ref >60)
Glucose, Bld: 96 mg/dL (ref 70–99)
Potassium: 4.5 mmol/L (ref 3.5–5.1)
Sodium: 131 mmol/L — ABNORMAL LOW (ref 135–145)
Total Bilirubin: 0.6 mg/dL (ref 0.3–1.2)
Total Protein: 6.8 g/dL (ref 6.5–8.1)

## 2021-10-11 LAB — CBC WITH DIFFERENTIAL/PLATELET
Abs Immature Granulocytes: 0.06 10*3/uL (ref 0.00–0.07)
Basophils Absolute: 0 10*3/uL (ref 0.0–0.1)
Basophils Relative: 0 %
Eosinophils Absolute: 0 10*3/uL (ref 0.0–0.5)
Eosinophils Relative: 0 %
HCT: 30.7 % — ABNORMAL LOW (ref 36.0–46.0)
Hemoglobin: 9.7 g/dL — ABNORMAL LOW (ref 12.0–15.0)
Immature Granulocytes: 1 %
Lymphocytes Relative: 8 %
Lymphs Abs: 0.7 10*3/uL (ref 0.7–4.0)
MCH: 32.2 pg (ref 26.0–34.0)
MCHC: 31.6 g/dL (ref 30.0–36.0)
MCV: 102 fL — ABNORMAL HIGH (ref 80.0–100.0)
Monocytes Absolute: 0.6 10*3/uL (ref 0.1–1.0)
Monocytes Relative: 6 %
Neutro Abs: 8 10*3/uL — ABNORMAL HIGH (ref 1.7–7.7)
Neutrophils Relative %: 85 %
Platelets: 208 10*3/uL (ref 150–400)
RBC: 3.01 MIL/uL — ABNORMAL LOW (ref 3.87–5.11)
RDW: 17.6 % — ABNORMAL HIGH (ref 11.5–15.5)
WBC: 9.3 10*3/uL (ref 4.0–10.5)
nRBC: 0 % (ref 0.0–0.2)

## 2021-10-11 LAB — PROTIME-INR
INR: 1.1 (ref 0.8–1.2)
Prothrombin Time: 13.9 seconds (ref 11.4–15.2)

## 2021-10-11 LAB — LIPASE, BLOOD: Lipase: 23 U/L (ref 11–51)

## 2021-10-11 LAB — TROPONIN I (HIGH SENSITIVITY)
Troponin I (High Sensitivity): 288 ng/L (ref <18)
Troponin I (High Sensitivity): 293 ng/L (ref <18)

## 2021-10-11 LAB — HEPATITIS B SURFACE ANTIBODY,QUALITATIVE: Hep B S Ab: NONREACTIVE

## 2021-10-11 MED ORDER — HEPARIN SODIUM (PORCINE) 1000 UNIT/ML DIALYSIS: 2000.0000 [IU] | Freq: Once | INTRAMUSCULAR | Status: DC

## 2021-10-11 MED ORDER — SEVELAMER CARBONATE 800 MG PO TABS
800.0000 mg | ORAL_TABLET | Freq: Three times a day (TID) | ORAL | Status: DC
  Administered 2021-10-11 – 2021-10-14 (×8): 800 mg via ORAL
  Filled 2021-10-11 (×8): qty 1

## 2021-10-11 MED ORDER — SORBITOL 70 % SOLN: 30.0000 mL | Status: DC | PRN

## 2021-10-11 MED ORDER — CALCIUM CARBONATE ANTACID 1250 MG/5ML PO SUSP
500.0000 mg | Freq: Four times a day (QID) | ORAL | Status: DC | PRN
  Filled 2021-10-11: qty 5

## 2021-10-11 MED ORDER — MIDODRINE HCL 5 MG PO TABS: 10.0000 mg | ORAL_TABLET | ORAL | Status: DC | PRN

## 2021-10-11 MED ORDER — METOCLOPRAMIDE HCL 5 MG PO TABS
5.0000 mg | ORAL_TABLET | Freq: Three times a day (TID) | ORAL | Status: DC
  Administered 2021-10-11 – 2021-10-14 (×7): 5 mg via ORAL
  Filled 2021-10-11 (×8): qty 1

## 2021-10-11 MED ORDER — ACETAMINOPHEN 650 MG RE SUPP: 650.0000 mg | Freq: Four times a day (QID) | RECTAL | Status: DC | PRN

## 2021-10-11 MED ORDER — ONDANSETRON HCL 4 MG/2ML IJ SOLN
4.0000 mg | Freq: Four times a day (QID) | INTRAMUSCULAR | Status: DC | PRN
  Administered 2021-10-12: 23:00:00 4 mg via INTRAVENOUS
  Filled 2021-10-11: qty 2

## 2021-10-11 MED ORDER — MIDODRINE HCL 5 MG PO TABS: 10.0000 mg | ORAL_TABLET | ORAL | Status: DC

## 2021-10-11 MED ORDER — HYDROXYZINE HCL 25 MG PO TABS
25.0000 mg | ORAL_TABLET | Freq: Three times a day (TID) | ORAL | Status: DC | PRN
  Filled 2021-10-11: qty 1

## 2021-10-11 MED ORDER — PREGABALIN 25 MG PO CAPS
50.0000 mg | ORAL_CAPSULE | Freq: Every day | ORAL | Status: DC
  Administered 2021-10-11 – 2021-10-13 (×3): 50 mg via ORAL
  Filled 2021-10-11 (×3): qty 2

## 2021-10-11 MED ORDER — SENNOSIDES-DOCUSATE SODIUM 8.6-50 MG PO TABS
2.0000 | ORAL_TABLET | Freq: Two times a day (BID) | ORAL | Status: DC
  Administered 2021-10-11 – 2021-10-14 (×6): 2 via ORAL
  Filled 2021-10-11 (×6): qty 2

## 2021-10-11 MED ORDER — ACETAMINOPHEN 325 MG PO TABS
650.0000 mg | ORAL_TABLET | Freq: Four times a day (QID) | ORAL | Status: DC | PRN
  Administered 2021-10-12: 13:00:00 650 mg via ORAL
  Filled 2021-10-11: qty 2

## 2021-10-11 MED ORDER — APIXABAN 2.5 MG PO TABS
2.5000 mg | ORAL_TABLET | Freq: Two times a day (BID) | ORAL | Status: DC
  Administered 2021-10-11 – 2021-10-14 (×5): 2.5 mg via ORAL
  Filled 2021-10-11 (×8): qty 1

## 2021-10-11 MED ORDER — CAMPHOR-MENTHOL 0.5-0.5 % EX LOTN: 1.0000 "application " | TOPICAL_LOTION | Freq: Three times a day (TID) | CUTANEOUS | Status: DC | PRN

## 2021-10-11 MED ORDER — ONDANSETRON HCL 4 MG PO TABS: 4.0000 mg | ORAL_TABLET | Freq: Four times a day (QID) | ORAL | Status: DC | PRN

## 2021-10-11 MED ORDER — CITALOPRAM HYDROBROMIDE 20 MG PO TABS
10.0000 mg | ORAL_TABLET | Freq: Every day | ORAL | Status: DC
  Administered 2021-10-12 – 2021-10-14 (×3): 10 mg via ORAL
  Filled 2021-10-11 (×3): qty 1

## 2021-10-11 MED ORDER — LACTULOSE 10 GM/15ML PO SOLN
10.0000 g | Freq: Three times a day (TID) | ORAL | Status: DC
  Administered 2021-10-11 – 2021-10-14 (×8): 10 g via ORAL
  Filled 2021-10-11: qty 30
  Filled 2021-10-11 (×5): qty 15
  Filled 2021-10-11: qty 30
  Filled 2021-10-11: qty 15

## 2021-10-11 MED ORDER — POLYETHYLENE GLYCOL 3350 17 G PO PACK
17.0000 g | PACK | Freq: Every day | ORAL | Status: DC
  Administered 2021-10-12 – 2021-10-14 (×3): 17 g via ORAL
  Filled 2021-10-11 (×3): qty 1

## 2021-10-11 MED ORDER — MIDODRINE HCL 5 MG PO TABS: 10.0000 mg | ORAL_TABLET | Freq: Three times a day (TID) | ORAL | Status: DC | PRN

## 2021-10-11 MED ORDER — DEXAMETHASONE 0.5 MG PO TABS
1.0000 mg | ORAL_TABLET | Freq: Every day | ORAL | Status: DC
  Administered 2021-10-11 – 2021-10-13 (×3): 1 mg via ORAL
  Filled 2021-10-11 (×5): qty 2

## 2021-10-11 MED ORDER — DEXAMETHASONE 4 MG PO TABS
2.0000 mg | ORAL_TABLET | Freq: Every day | ORAL | Status: DC
  Administered 2021-10-12 – 2021-10-14 (×3): 2 mg via ORAL
  Filled 2021-10-11 (×4): qty 1

## 2021-10-11 MED ORDER — DOCUSATE SODIUM 283 MG RE ENEM: 1.0000 | ENEMA | RECTAL | Status: DC | PRN

## 2021-10-11 MED ORDER — NEPRO/CARBSTEADY PO LIQD: 237.0000 mL | Freq: Three times a day (TID) | ORAL | Status: DC | PRN

## 2021-10-11 MED ORDER — CHLORHEXIDINE GLUCONATE CLOTH 2 % EX PADS
6.0000 | MEDICATED_PAD | Freq: Every day | CUTANEOUS | Status: DC
  Administered 2021-10-12 – 2021-10-14 (×3): 6 via TOPICAL

## 2021-10-11 MED ORDER — ALLOPURINOL 100 MG PO TABS
50.0000 mg | ORAL_TABLET | Freq: Every day | ORAL | Status: DC
  Administered 2021-10-12 – 2021-10-14 (×3): 50 mg via ORAL
  Filled 2021-10-11 (×3): qty 1

## 2021-10-11 MED ORDER — OXYCODONE HCL 5 MG PO TABS
5.0000 mg | ORAL_TABLET | ORAL | Status: DC | PRN
  Administered 2021-10-12 – 2021-10-14 (×3): 5 mg via ORAL
  Filled 2021-10-11 (×3): qty 1

## 2021-10-11 MED ORDER — ACYCLOVIR 400 MG PO TABS
400.0000 mg | ORAL_TABLET | Freq: Every day | ORAL | Status: DC
  Administered 2021-10-12 – 2021-10-14 (×3): 400 mg via ORAL
  Filled 2021-10-11 (×4): qty 1

## 2021-10-11 NOTE — Assessment & Plan Note (Addendum)
-  Patient presenting with acute onset of leg weakness, causing her to fall towards the floor (caught by family) -She reports prior hospitalization for the same, but this is not evident on brief chart review -Uncertain etiology, unremarkable evaluation so far -She does have h/o back pain so EDP ordered T-spine and L-spine CTs; these are just reviewed and show widespread lytic lesions that are significantly worse than prior - will consult Dr. Alvy Bimler -Will admit to Med Surg -PT/OT consults requested

## 2021-10-11 NOTE — Assessment & Plan Note (Signed)
-  Excellent prior A1c -She is on minimal medication, ?need for ongoing meds -Will hold coverage for now -Of note, she has been on Decadron for appetite enhancement and Dr. Alvy Bimler has considered weaning her off; she is unlikely to need DM meds if this medication is removed

## 2021-10-11 NOTE — Assessment & Plan Note (Signed)
-  Mildly worse than last check but appears to be at/near baseline -No report of bleeding -Recheck in AM

## 2021-10-11 NOTE — ED Notes (Signed)
Pt has been sleeping. Pt awakened to take meds and eat dinner

## 2021-10-11 NOTE — H&P (Addendum)
History and Physical    Patient: [name] PQD:[id number] DOB: September 23, 1930 DOA: 10/11/2021 DOS: the patient was seen and examined on 10/11/2021 PCP: Lucianne Lei, MD  Patient coming from: Home   Chief Complaint: Leg weakness  HPI: [name] is a 87 y.o. female with medical history significant of pacemaker placement; HTN; DM; glaucoma; gout; ESRD on TTS HD; multiple myeloma; and OSA presenting with leg weakness.  She was getting ready to go to HD and her R leg gave way and then her L leg buckled.  She dropped but not all the way to the ground.  This happened one time.  She was not having pain.  No chest pain at all today.  She feels ok now.      ER Course:   HD patient.  Too weak to walk to HD today, legs gave way but caught by her son.  Has spine and back issues, due to get thoracic PT.  Sometimes radiates to chest, but none in ER.  ?weaker on LLE.  Troponin elevated above baseline.  Cardiology reviewed, thinks unlikely cardiac, recommends trending but not planning to consult.  CTA a year ago so not repeated but did CT T/L spine.     Review of Systems: ROS reviewed and negative except as above  Past Medical History:  Diagnosis Date   Anemia    Arthritis    Benign paroxysmal positional vertigo 04/06/2010   CAROTID BRUIT 94/04/6807   Complication of anesthesia    took a long time to wake up with knee replacement   DIABETES MELLITUS, TYPE II 02/24/2008   ESRD (end stage renal disease) on dialysis ([place])    tues thurs [facility] [place] [facility]   GERD (gastroesophageal reflux disease)    Glaucoma    Gout    HYPERTENSION 02/24/2008   Multiple myeloma ([place])    Obstructive sleep apnea    Pancytopenia ([place])    Sick sinus syndrome ([place])    now with pacemaker   Wears dentures    Wears glasses    Past Surgical History:  Procedure Laterality Date   A/V FISTULAGRAM Right 03/08/2020   Procedure: A/V FISTULAGRAM- Right Arm;  Surgeon: Waynetta Sandy, MD;  Location: [place] [facility];  Service: Cardiovascular;  Laterality: Right;   A/V FISTULAGRAM N/A 06/28/2020   Procedure: A/V FISTULAGRAM - Right Upper Arm;  Surgeon: Waynetta Sandy, MD;  Location: [place] [facility];  Service: Cardiovascular;  Laterality: N/A;   ABDOMINAL HYSTERECTOMY     1980's   AV FISTULA PLACEMENT Right 03/11/2019   Procedure: CREATION RIGHT ARM RADIOCEPHALIC ARTERIOVENOUS FISTULA;  Surgeon: Angelia Mould, MD;  Location: [place];  Service: Vascular;  Laterality: Right;   AV FISTULA PLACEMENT Right 03/25/2020   Procedure: right arm ARTERIOVENOUS (AV) FISTULA CREATION;  Surgeon: Waynetta Sandy, MD;  Location: [place];  Service: Vascular;  Laterality: Right;   BACK SURGERY     x 3   BIOPSY  06/24/2019   Procedure: BIOPSY;  Surgeon: Ronald Lobo, MD;  Location: [facility];  Service: Endoscopy;;   BUBBLE STUDY  01/07/2021   Procedure: BUBBLE STUDY;  Surgeon: Sueanne Margarita, MD;  Location: [place];  Service: Cardiovascular;;   CARDIOVERSION N/A 01/07/2021   Procedure: CARDIOVERSION;  Surgeon: Sueanne Margarita, MD;  Location: [facility];  Service: Cardiovascular;  Laterality: N/A;   EP IMPLANTABLE DEVICE N/A 05/16/2016   Procedure: PPM Generator Changeout;  Surgeon: Thompson Grayer,  MD;  Location: [place] [facility];  Service: Cardiovascular;  Laterality: N/A;   ESOPHAGOGASTRODUODENOSCOPY (EGD) WITH PROPOFOL N/A 06/24/2019   Procedure: ESOPHAGOGASTRODUODENOSCOPY (EGD) WITH PROPOFOL;  Surgeon: Ronald Lobo, MD;  Location: [facility];  Service: Endoscopy;  Laterality: N/A;   FISTULA SUPERFICIALIZATION Right 03/11/2019   Procedure: FISTULA SUPERFICIALIZATION;  Surgeon: Angelia Mould, MD;  Location: [place];  Service: Vascular;  Laterality: Right;   INSERTION OF DIALYSIS CATHETER Right 03/25/2020   Procedure: INSERTION OF DIALYSIS CATHETER, right internal jugular;  Surgeon: Waynetta Sandy, MD;  Location: [place];   Service: Vascular;  Laterality: Right;   IR FLUORO GUIDE CV LINE RIGHT  03/07/2019   IR US GUIDE VASC ACCESS RIGHT  03/07/2019   KNEE SURGERY Right    2003   KNEE SURGERY Left    2011   PACEMAKER INSERTION     PERIPHERAL VASCULAR BALLOON ANGIOPLASTY Right 06/28/2020   Procedure: PERIPHERAL VASCULAR BALLOON ANGIOPLASTY;  Surgeon: Waynetta Sandy, MD;  Location: [place] [facility];  Service: Cardiovascular;  Laterality: Right;   TEE WITHOUT CARDIOVERSION N/A 01/07/2021   Procedure: TRANSESOPHAGEAL ECHOCARDIOGRAM (TEE);  Surgeon: Sueanne Margarita, MD;  Location: [facility];  Service: Cardiovascular;  Laterality: N/A;   Social History:  reports that she has never smoked. She has never used smokeless tobacco. She reports that she does not drink alcohol and does not use drugs.  Allergies  Allergen Reactions   Aspirin Other (See Comments)    REACTION: STOMACH ISSUES WITH DOSE HIGHER THAN 81 MG  Other reaction(s): GI Upset (intolerance), Other (See Comments) REACTION: STOMACH ISSUES WITH DOSE HIGHER THAN 81 MG  REACTION: STOMACH ISSUES WITH DOSE HIGHER THAN 81 MG    Hydrocodone-Acetaminophen Nausea And Vomiting   Morphine Nausea And Vomiting   Penicillins Rash    Patient took injection and tablets, had a reaction. She has taken amoxicillin with no reaction Has patient had a PCN reaction causing immediate rash, facial/tongue/throat swelling, SOB or lightheadedness with hypotension: Yes Has patient had a PCN reaction causing severe rash involving mucus membranes or skin necrosis: Yes Has patient had a PCN reaction that required hospitalization No Has patient had a PCN reaction occurring within the last 10 years: No If all of the above answers are "NO", then m Patient took injection and tablets, had a reaction. She has taken amoxicillin with no reaction Patient took injection and tablets, had a reaction. She has taken amoxicillin with no reaction Has patient had a PCN reaction causing  immediate rash, facial/tongue/throat swelling, SOB or lightheadedness with hypotension: Yes Has patient had a PCN reaction causing severe rash involving mucus membranes or skin necrosis: Yes Has patient had a PCN reaction that required hospitalization No Has patient had a PCN reaction occurring within the last 10 years: No If all of the above answers are "NO", then m   Betaine Nausea And Vomiting   Dacarbazine Nausea And Vomiting   Ketorolac Tromethamine Itching   Brimonidine Itching   Diltiazem Hcl Rash   Hydrocodone-Acetaminophen Nausea And Vomiting   Neurontin [Gabapentin] Nausea And Vomiting   Sulfacetamide Sodium Rash    Family History  Problem Relation Age of Onset   Congestive Heart Failure Mother    Tuberculosis Mother    Multiple myeloma Mother    Bone cancer Father    Arthritis Father    Breast cancer Sister    Colon cancer Sister    Hypertension Sister    Dementia Sister    Alcohol abuse  Son     Prior to Admission medications   Medication Sig Start Date End Date Taking? Authorizing Provider  acetaminophen (TYLENOL) 500 MG tablet Take 1,000 mg by mouth 2 (two) times daily as needed for mild pain or moderate pain.    Yes [provider]  acyclovir (ZOVIRAX) 400 MG tablet Take 1 tablet (400 mg total) by mouth daily. 09/15/20  Yes Gorsuch, Ni, MD  allopurinol (ZYLOPRIM) 100 MG tablet Take 0.5 tablets (50 mg total) by mouth daily. 03/12/19  Yes Nita Sells, MD  B Complex-C-Zn-Folic Acid (DIALYVITE 938-BOFB 15) 0.8 MG TABS Take 1 tablet by mouth daily. 07/05/21  Yes [provider]  cholecalciferol (VITAMIN D3) 25 MCG (1000 UT) tablet Take 2,000 Units by mouth daily.   Yes [provider]  citalopram (CELEXA) 10 MG tablet Take 10 mg by mouth daily.   Yes [provider]  dexamethasone (DECADRON) 1 MG tablet Take 1 tablet (1 mg total) by mouth daily. 09/06/21  Yes Gorsuch, Ni, MD  dexamethasone (DECADRON) 2 MG tablet TAKE 1 TABLET BY  MOUTH EVERY DAY Patient taking differently: Take 2 mg by mouth daily. 08/15/21  Yes Gorsuch, Ni, MD  ELIQUIS 2.5 MG TABS tablet TAKE 1 TABLET BY MOUTH TWICE A DAY Patient taking differently: Take 2.5 mg by mouth 2 (two) times daily. 03/01/21  Yes Allred, Jeneen Rinks, MD  fluorometholone (FML) 0.1 % ophthalmic suspension Place 1 drop into the right eye daily as needed (eyes). 02/01/19  Yes [provider]  glimepiride (AMARYL) 1 MG tablet Take 0.5 mg by mouth daily.  04/07/20  Yes [provider]  lactulose (CHRONULAC) 10 GM/15ML solution TAKE 15 MLS (10 G TOTAL) BY MOUTH 3 (THREE) TIMES DAILY. Patient taking differently: Take 10 g by mouth 3 (three) times daily. 09/28/21  Yes Gorsuch, Ni, MD  lidocaine (LIDODERM) 5 % Place 1 patch onto the skin daily. 09/26/21  Yes [provider]  lidocaine-prilocaine (EMLA) cream Apply 1 application topically as needed [facility]).  05/26/20  Yes [provider]  LYRICA 50 MG capsule TAKE 1 CAPSULE (50 MG TOTAL) BY MOUTH AT BEDTIME. 09/05/21  Yes Gorsuch, Ni, MD  metoCLOPramide (REGLAN) 5 MG tablet TAKE 1 TABLET BY MOUTH 3 TIMES DAILY BEFORE MEALS Patient taking differently: Take 5 mg by mouth 3 (three) times daily before meals. 06/22/21  Yes Gorsuch, Ni, MD  midodrine (PROAMATINE) 10 MG tablet Take 10 mg by mouth as directed. Low bp, and before dialysis 12/28/20  Yes [provider]  Multiple Vitamin (MULTIVITAMIN) tablet Take 1 tablet by mouth daily.   Yes [provider]  Nutritional Supplements (FEEDING SUPPLEMENT, NEPRO CARB STEADY,) LIQD Take 237 mLs by mouth 2 (two) times daily between meals. 01/08/21  Yes Lavina Hamman, MD  ondansetron (ZOFRAN) 4 MG tablet Take 1 tablet (4 mg total) by mouth every 8 (eight) hours as needed for nausea. 10/27/20  Yes Gorsuch, Ni, MD  oxyCODONE (OXY IR/ROXICODONE) 5 MG immediate release tablet Take 1 tablet (5 mg total) by mouth every 4 (four) hours as needed for severe pain. 03/17/21  Yes  Gorsuch, Ni, MD  pantoprazole (PROTONIX) 40 MG tablet TAKE 1 TABLET (40 MG TOTAL) BY MOUTH DAILY AS NEEDED (ACID REFLUX/INDIGESTION.). 02/17/21  Yes Gorsuch, Ni, MD  polyethylene glycol (MIRALAX / GLYCOLAX) 17 g packet Take 17 g by mouth daily.   Yes [provider]  senna-docusate (SENOKOT-S) 8.6-50 MG tablet Take 2 tablets by mouth 2 (two) times daily. 03/11/19  Yes  Nita Sells, MD  sevelamer carbonate (RENVELA) 800 MG tablet Take 800 mg by mouth in the morning, at noon, and at bedtime. 09/13/21  Yes [provider]  Nutritional Supplements (,FEEDING SUPPLEMENT, PROSOURCE PLUS) liquid Take 30 mLs by mouth 3 (three) times daily between meals. Patient not taking: Reported on 10/11/2021 01/07/21   Lavina Hamman, MD    Physical Exam: Vitals:   10/11/21 1445 10/11/21 1500 10/11/21 1630 10/11/21 1803  BP: 104/65 (!) 113/58 112/60 98/62  Pulse: 69 72 69 70  Resp:  11    Temp:      TempSrc:      SpO2: 94% 98% 98% 100%  Weight:      Height:       General:  Appears calm and comfortable and is in NAD, frail Eyes:  R eye visual impairment (chronic) normal lids ENT:  grossly normal hearing, lips & tongue, mmm Neck:  no LAD, masses or thyromegaly Cardiovascular:  RRR, no m/r/g. No LE edema.  Respiratory:   CTA bilaterally with no wheezes/rales/rhonchi.  Normal respiratory effort. Abdomen:  soft, NT, ND Skin:  no rash or induration seen on limited exam Musculoskeletal:  grossly normal tone BUE/BLE, good ROM, no bony abnormality Psychiatric:  grossly normal mood and affect, speech fluent and appropriate, AOx3 Neurologic:  CN 2-12 grossly intact, moves all extremities in coordinated fashion   Radiological Exams on Admission: Independently reviewed - see discussion in A/P where applicable  CT Thoracic Spine Wo Contrast  Result Date: 10/11/2021 CLINICAL DATA:  Multiple falls. Increased back pain. History of multiple myeloma. EXAM: CT THORACIC AND LUMBAR SPINE WITHOUT  CONTRAST TECHNIQUE: Multidetector CT imaging of the thoracic and lumbar spine was performed without contrast. Multiplanar CT image reconstructions were also generated. COMPARISON:  CTA chest 09/10/2020. CT abdomen and pelvis 11/27/2019. FINDINGS: CT THORACIC SPINE FINDINGS Alignment: Unchanged grade 1 anterolisthesis of C7 on T1. Vertebrae: Numerous lytic lesions throughout the thoracic spine. Large destructive lesion involving the left aspect of the T1 vertebral body, left-sided T1 posterior elements, and adjacent left first rib with prominent extraosseous soft tissue component involving the left C7-T1 and T1-2 neural foramina, greatly progressed from the 09/10/2020 chest CTA. 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 New lesion involving the left lamina of T10 with cortical disruption and extraosseous tumor extension posteriorly. No acute fracture. Paraspinal and other soft tissues: Aortic and coronary atherosclerosis. Calcified right hilar lymph nodes. Disc levels: Diffuse thoracic disc degeneration. Mild-to-moderate spinal stenosis at T11-12 due to circumferential disc bulging, endplate spurring, and severe right and moderate left facet arthrosis. CT LUMBAR SPINE FINDINGS Segmentation: 5 lumbar type vertebrae. Alignment: Moderate lumbar dextroscoliosis. Chronic grade 1 anterolisthesis of L4 on L5. Vertebrae: Numerous lytic lesions throughout the lumbar spine and included sacrum including a dominant 2.5 cm lesion in the L1 vertebral body, similar to the 11/27/2019 CT of the abdomen and pelvis. No acute fracture. Prior Ray cage fusion at L4-5. Paraspinal and other soft tissues: Bilateral renal atrophy. Abdominal aortic atherosclerosis. Disc levels: Advanced disc and facet degeneration from L2-3 to L5-S1. Prior laminectomies at L2-3, L3-4, and L4-5. Moderate spinal stenosis and severe left neural foraminal stenosis at L3-4 due to left eccentric disc bulging and  posterior element hypertrophy. IMPRESSION: 1. Widespread lytic bone lesions consistent with multiple myeloma. 2. Large destructive lesion involving the left aspect of vertebral body and posterior elements of T1 and adjacent left first rib, greatly progressed from 09/10/2020. Suspected left-sided ventral epidural  tumor at T1, not well evaluated by CT. 3. No acute thoracic or lumbar spine fracture. 4. Advanced lumbar disc and facet degeneration with moderate spinal stenosis and severe left neural foraminal stenosis at L3-4. 5. Aortic Atherosclerosis (ICD10-I70.0). Electronically Signed   By: Logan Bores M.D.   On: 10/11/2021 17:20   CT Lumbar Spine Wo Contrast  Result Date: 10/11/2021 CLINICAL DATA:  Multiple falls. Increased back pain. History of multiple myeloma. EXAM: CT THORACIC AND LUMBAR SPINE WITHOUT CONTRAST TECHNIQUE: Multidetector CT imaging of the thoracic and lumbar spine was performed without contrast. Multiplanar CT image reconstructions were also generated. COMPARISON:  CTA chest 09/10/2020. CT abdomen and pelvis 11/27/2019. FINDINGS: CT THORACIC SPINE FINDINGS Alignment: Unchanged grade 1 anterolisthesis of C7 on T1. Vertebrae: Numerous lytic lesions throughout the thoracic spine. Large destructive lesion involving the left aspect of the T1 vertebral body, left-sided T1 posterior elements, and adjacent left first rib with prominent extraosseous soft tissue component involving the left C7-T1 and T1-2 neural foramina, greatly progressed from the 09/10/2020 chest CTA. 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 New lesion involving the left lamina of T10 with cortical disruption and extraosseous tumor extension posteriorly. No acute fracture. Paraspinal and other soft tissues: Aortic and coronary atherosclerosis. Calcified right hilar lymph nodes. Disc levels: Diffuse thoracic disc degeneration. Mild-to-moderate spinal stenosis at T11-12 due to  circumferential disc bulging, endplate spurring, and severe right and moderate left facet arthrosis. CT LUMBAR SPINE FINDINGS Segmentation: 5 lumbar type vertebrae. Alignment: Moderate lumbar dextroscoliosis. Chronic grade 1 anterolisthesis of L4 on L5. Vertebrae: Numerous lytic lesions throughout the lumbar spine and included sacrum including a dominant 2.5 cm lesion in the L1 vertebral body, similar to the 11/27/2019 CT of the abdomen and pelvis. No acute fracture. Prior Ray cage fusion at L4-5. Paraspinal and other soft tissues: Bilateral renal atrophy. Abdominal aortic atherosclerosis. Disc levels: Advanced disc and facet degeneration from L2-3 to L5-S1. Prior laminectomies at L2-3, L3-4, and L4-5. Moderate spinal stenosis and severe left neural foraminal stenosis at L3-4 due to left eccentric disc bulging and posterior element hypertrophy. IMPRESSION: 1. Widespread lytic bone lesions consistent with multiple myeloma. 2. Large destructive lesion involving the left aspect of vertebral body and posterior elements of T1 and adjacent left first rib, greatly progressed from 09/10/2020. 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 3. No acute thoracic or lumbar spine fracture. 4. Advanced lumbar disc and facet degeneration with moderate spinal stenosis and severe left neural foraminal stenosis at L3-4. 5. Aortic Atherosclerosis (ICD10-I70.0). Electronically Signed   By: Logan Bores M.D.   On: 10/11/2021 17:20   DG Chest Port 1 View  Result Date: 10/11/2021 CLINICAL DATA:  Chest pain EXAM: PORTABLE CHEST 1 VIEW COMPARISON:  01/12/2021 FINDINGS: Transverse diameter of heart is increased. There are no signs of pulmonary edema or focal pulmonary consolidation. There is no pleural effusion or pneumothorax. Pacemaker battery is seen in the left infraclavicular region with tips of leads in right atrium and right ventricle. IMPRESSION: Cardiomegaly. There are no signs of pulmonary edema  or focal pulmonary consolidation. Electronically Signed   By: Elmer Picker M.D.   On: 10/11/2021 13:09    EKG: Independently reviewed.  Paced rate at 70 with LBBB   Labs on Admission: I have personally reviewed the available labs and imaging studies at the time of the admission.  Pertinent labs:    BUN 87/Creatinine 7.86/GFR 5 HS troponin 288, 293  Hgb 9,7; 11.3 on 10/10 INR 1.1 COVID/flu negative   Assessment/Plan * Ambulatory dysfunction- (present on admission) -Patient presenting with acute onset of leg weakness, causing her to fall towards the floor (caught by family) -She reports prior hospitalization for the same, but this is not evident on brief chart review -Uncertain etiology, unremarkable evaluation so far -She does have h/o back pain so EDP ordered T-spine and L-spine CTs; these are just reviewed and show widespread lytic lesions that are significantly worse than prior - will consult Dr. Alvy Bimler -Will observe overnight on Med Surg -PT/OT consults requested  Anemia in chronic kidney disease- (present on admission) -Mildly worse than last check but appears to be at/near baseline -No report of bleeding -Recheck in AM  Elevated troponin- (present on admission) -Denies recent chest pain -Troponin is flat -EDP d/w Dr. Acie Fredrickson who recommends no further evaluation at this time  Cancer associated pain- (present on admission) -Chronic bone pain -She is not a candidate for aggressive management given her age and comorbidities, per Dr. Alvy Bimler -At last visit the decision was made to increase tramadol to 100 mg each time as needed and to reserve to use oxycodone for severe pain only -I have reviewed this patient in the [place] Controlled Substances Reporting System.  She is receiving medications from two providers but appears to be taking them as prescribed. -She is at high risk of opioid misuse, diversion, or overdose. -Unfortunately, with her lytic lesions she may need  increasing doses of medications. -Briefly discussed with neurosurgery re: L3-4 - this is likely chronic and without radicular symptoms further evaluation/treatment is likely not needed  ESRD (end stage renal disease) on dialysis [facility]) -Patient on chronic TTS HD -Nephrology prn order set utilized -She does not appear to be volume overloaded or otherwise in need of acute HD but was due today so likely needs prior to d/c -Nephrology was notified that patient will need HD  Multiple myeloma not having achieved remission ([place])- (present on admission) -She has had excellent response to therapy per most recent clinic note and is almost in remission -Because of this and based on other medical problems the decision was made to hold further treatment -However, imaging today shows significant lytic lesions in her spine and widespread -Continue daily acyclovir -Will consult oncology - patient d/w Dr. Alen Blew (Dr. Alvy Bimler is on vacation) and he will see in AM -She may benefit from resumption of treatment and/or radiation therapy, but this needs to be a discussion with oncology  Diabetes mellitus ([place]) -Excellent prior A1c -She is on minimal medication, ?need for ongoing meds -Will hold coverage for now -Of note, she has been on Decadron for appetite enhancement and Dr. Alvy Bimler has considered weaning her off; she is unlikely to need DM meds if this medication is removed    Advance Care Planning:   Code Status: DNR   Consults: Cardiology (telephone only); nephrology; PT/OT/TOC team  Family Communication: None present; she is capable of chatting with family at the time of admission  Severity of Illness: Admit - It is my clinical opinion that admission to INPATIENT is reasonable and necessary because of the expectation that this patient will require hospital care that crosses at least 2 midnights to treat this condition based on the medical complexity of the problems presented.  Given the aforementioned  information, the predictability of an adverse outcome is felt to be significant.   Author: Karmen Bongo, M.D. 10/11/2021 6:09 PM  For on call review [email or web address].

## 2021-10-11 NOTE — Assessment & Plan Note (Addendum)
-  Chronic bone pain -She is not a candidate for aggressive management given her age and comorbidities, per Dr. Alvy Bimler -At last visit the decision was made to increase tramadol to 100 mg each time as needed and to reserve to use oxycodone for severe pain only -I have reviewed this patient in the [place] Controlled Substances Reporting System.  She is receiving medications from two providers but appears to be taking them as prescribed. -She is at high risk of opioid misuse, diversion, or overdose. -Unfortunately, with her lytic lesions she may need increasing doses of medications. -Briefly discussed with neurosurgery re: L3-4 - this is likely chronic and without radicular symptoms further evaluation/treatment is likely not needed

## 2021-10-11 NOTE — Assessment & Plan Note (Addendum)
-  She has had excellent response to therapy per most recent clinic note and is almost in remission -Because of this and based on other medical problems the decision was made to hold further treatment -However, imaging today shows significant lytic lesions in her spine and widespread -Continue daily acyclovir -Will consult oncology - patient d/w Dr. Alen Blew (Dr. Alvy Bimler is on vacation) and he will see in AM -She may benefit from resumption of treatment and/or radiation therapy, but this needs to be a discussion with oncology

## 2021-10-11 NOTE — ED Provider Notes (Signed)
[facility] Provider Note   CSN: [id number] Arrival date & time: 10/11/21  1146     History Chief Complaint  Patient presents with   Shoulder Pain   Extremity Weakness    [name] is a 87 y.o. female.  HPI patient has complex medical history including ESRD on dialysis and atrial fibrillation with a pacer.  Patient is a had a problem with ongoing pain in her left shoulder and thoracic back she was supposed to get physical therapy but was not able to start therapy.  She reports that more recently now, she is getting some pain that radiates from that area up into her underarm and toward her chest.  She was leaving for dialysis this morning with her son when she abruptly became extremely weak in both legs.  She was about to collapse due to weakness in the legs and he was able to catch her without injury.  After little while she thought she was feeling better and did not think she needed transport to the hospital.  But, as she was home and try to get up again and again she found her self to be extremely weak in the legs.  This seems to have waxed and waned.  Now, in a supine position she reports she can move both legs.  He reports the chest pain and shoulder pain have gone away at this point.  At baseline patient is using a walker regularly to help with gait instability and weakness.  However, she has never been as weak as she was this morning.  Patient denies she feels short of breath.  He denies she feels numbness or tingling in her feet.  Patient has had chronic lower back problems but denies she is actively having pain.  She reports all of her pain she has and has been dealing with his in her upper thoracic back to the left.    Past Medical History:  Diagnosis Date   Anemia    Arthritis    Benign paroxysmal positional vertigo 04/06/2010   CAROTID BRUIT 75/07/2584   Complication of anesthesia    took a long time to wake up with knee  replacement   DIABETES MELLITUS, TYPE II 02/24/2008   ESRD (end stage renal disease) on dialysis ([place])    tues thurs [facility] sees [place] [facility]   GERD (gastroesophageal reflux disease)    Glaucoma    Gout    HYPERTENSION 02/24/2008   Multiple myeloma ([place])    Obstructive sleep apnea    Pancytopenia ([place])    Sick sinus syndrome ([place])    now with pacemaker   Wears dentures    Wears glasses     Patient Active Problem List   Diagnosis Date Noted   Generalized weakness 10/11/2021   Protein-calorie malnutrition, severe 12/31/2020   Atrial flutter ([place]) 12/31/2020   Fluid overload 12/30/2020   Weight loss, non-intentional 12/10/2020   Pain due to onychomycosis of toenails of both feet 12/08/2020   Type 2 diabetes mellitus with vascular disease ([place]) 12/08/2020   Persistent atrial fibrillation ([place]) 11/08/2020   Gastritis 10/27/2020   Delayed gastric emptying 10/27/2020   Atypical atrial flutter ([place]) 10/25/2020   Secondary hypercoagulable state ([place]) 10/25/2020   Pulmonary edema 09/10/2020   Pleural effusion, bilateral 09/10/2020   Acute respiratory failure with hypoxia (HCC) 04/02/2020   Elevated troponin 04/02/2020   Hypotension 03/23/2020   Left hip pain 01/12/2020   Vitamin B12 deficiency 10/20/2019  Deficiency anemia 09/26/2019   Anaphylactic shock, unspecified, sequela 45/62/5638   Complication of vascular dialysis catheter 07/21/2019   Peripheral neuropathy due to chemotherapy ([place]) 07/04/2019   Encounter for removal of sutures 07/03/2019   Goals of care, counseling/discussion 05/30/2019   Pressure injury of skin 05/26/2019   Yeast infection of the skin 04/04/2019   Cancer associated pain 03/26/2019   Moderate protein-calorie malnutrition ([place]) 03/24/2019   Nausea in adult 03/14/2019   ESRD (end stage renal disease) on dialysis ([place]) 03/14/2019   Other constipation 03/14/2019   Coagulation defect, unspecified ([place]) 03/13/2019   Pancytopenia,  acquired ([place]) 03/12/2019   Anemia in chronic kidney disease 03/11/2019   Bradycardia, unspecified 03/11/2019   Iron deficiency anemia, unspecified 03/11/2019   Secondary hyperparathyroidism of renal origin ([place]) 03/11/2019   Multiple myeloma not having achieved remission ([place]) 03/03/2019   Hypokalemia 02/25/2019   Anemia    Shingles 03/09/2015   Sick sinus syndrome ([place]) 02/03/2015   Shortness of breath 02/01/2014   Fatigue 02/01/2014   BENIGN PAROXYSMAL POSITIONAL VERTIGO 04/06/2010   BRADYCARDIA 01/27/2009   Diabetes mellitus ([place]) 02/24/2008   Obstructive sleep apnea 02/24/2008   PERIODIC LIMB MOVEMENT DISORDER 02/24/2008   ALLERGIC RHINITIS 02/24/2008   CAROTID BRUIT 02/24/2008    Past Surgical History:  Procedure Laterality Date   A/V FISTULAGRAM Right 03/08/2020   Procedure: A/V FISTULAGRAM- Right Arm;  Surgeon: Waynetta Sandy, MD;  Location: [facility];  Service: Cardiovascular;  Laterality: Right;   A/V FISTULAGRAM N/A 06/28/2020   Procedure: A/V FISTULAGRAM - Right Upper Arm;  Surgeon: Waynetta Sandy, MD;  Location: [facility];  Service: Cardiovascular;  Laterality: N/A;   ABDOMINAL HYSTERECTOMY     1980's   AV FISTULA PLACEMENT Right 03/11/2019   Procedure: CREATION RIGHT ARM RADIOCEPHALIC ARTERIOVENOUS FISTULA;  Surgeon: Angelia Mould, MD;  Location: [facility];  Service: Vascular;  Laterality: Right;   AV FISTULA PLACEMENT Right 03/25/2020   Procedure: right arm ARTERIOVENOUS (AV) FISTULA CREATION;  Surgeon: Waynetta Sandy, MD;  Location: [facility];  Service: Vascular;  Laterality: Right;   BACK SURGERY     x 3   BIOPSY  06/24/2019   Procedure: BIOPSY;  Surgeon: Ronald Lobo, MD;  Location: [facility];  Service: Endoscopy;;   BUBBLE STUDY  01/07/2021   Procedure: BUBBLE STUDY;  Surgeon: Sueanne Margarita, MD;  Location: [facility];  Service: Cardiovascular;;   CARDIOVERSION N/A 01/07/2021   Procedure: CARDIOVERSION;   Surgeon: Sueanne Margarita, MD;  Location: [facility];  Service: Cardiovascular;  Laterality: N/A;   EP IMPLANTABLE DEVICE N/A 05/16/2016   Procedure: PPM Generator Changeout;  Surgeon: Thompson Grayer, MD;  Location: [facility];  Service: Cardiovascular;  Laterality: N/A;   ESOPHAGOGASTRODUODENOSCOPY (EGD) WITH PROPOFOL N/A 06/24/2019   Procedure: ESOPHAGOGASTRODUODENOSCOPY (EGD) WITH PROPOFOL;  Surgeon: Ronald Lobo, MD;  [facility];  Service: Endoscopy;  Laterality: N/A;   FISTULA SUPERFICIALIZATION Right 03/11/2019   Procedure: FISTULA SUPERFICIALIZATION;  Surgeon: Angelia Mould, MD;  Location: [facility];  Service: Vascular;  Laterality: Right;   INSERTION OF DIALYSIS CATHETER Right 03/25/2020   Procedure: INSERTION OF DIALYSIS CATHETER, right internal jugular;  Surgeon: Waynetta Sandy, MD;  Location: [facility];  Service: Vascular;  Laterality: Right;   IR FLUORO GUIDE CV LINE RIGHT  03/07/2019   IR US GUIDE VASC ACCESS RIGHT  03/07/2019   KNEE SURGERY Right    2003   KNEE SURGERY Left  2011   PACEMAKER INSERTION     PERIPHERAL VASCULAR BALLOON ANGIOPLASTY Right 06/28/2020   Procedure: PERIPHERAL VASCULAR BALLOON ANGIOPLASTY;  Surgeon: Waynetta Sandy, MD;  Location: [facility];  Service: Cardiovascular;  Laterality: Right;   TEE WITHOUT CARDIOVERSION N/A 01/07/2021   Procedure: TRANSESOPHAGEAL ECHOCARDIOGRAM (TEE);  Surgeon: Sueanne Margarita, MD;  Location[facility];  Service: Cardiovascular;  Laterality: N/A;     OB History   No obstetric history on file.     Family History  Problem Relation Age of Onset   Congestive Heart Failure Mother    Tuberculosis Mother    Multiple myeloma Mother    Bone cancer Father    Arthritis Father    Breast cancer Sister    Colon cancer Sister    Hypertension Sister    Dementia Sister    Alcohol abuse Son     Social History   Tobacco Use   Smoking status: Never   Smokeless tobacco: Never   Vaping Use   Vaping Use: Never used  Substance Use Topics   Alcohol use: No   Drug use: No    Home Medications Prior to Admission medications   Medication Sig Start Date End Date Taking? Authorizing Provider  acetaminophen (TYLENOL) 500 MG tablet Take 1,000 mg by mouth 2 (two) times daily as needed for mild pain or moderate pain.    Yes [provider]  acyclovir (ZOVIRAX) 400 MG tablet Take 1 tablet (400 mg total) by mouth daily. 09/15/20  Yes Gorsuch, Ni, MD  allopurinol (ZYLOPRIM) 100 MG tablet Take 0.5 tablets (50 mg total) by mouth daily. 03/12/19  Yes Nita Sells, MD  B Complex-C-Zn-Folic Acid (DIALYVITE 244-LPNP 15) 0.8 MG TABS Take 1 tablet by mouth daily. 07/05/21  Yes [provider]  cholecalciferol (VITAMIN D3) 25 MCG (1000 UT) tablet Take 2,000 Units by mouth daily.   Yes [provider]  citalopram (CELEXA) 10 MG tablet Take 10 mg by mouth daily.   Yes [provider]  dexamethasone (DECADRON) 1 MG tablet Take 1 tablet (1 mg total) by mouth daily. 09/06/21  Yes Gorsuch, Ni, MD  dexamethasone (DECADRON) 2 MG tablet TAKE 1 TABLET BY MOUTH EVERY DAY Patient taking differently: Take 2 mg by mouth daily. 08/15/21  Yes Gorsuch, Ni, MD  ELIQUIS 2.5 MG TABS tablet TAKE 1 TABLET BY MOUTH TWICE A DAY Patient taking differently: Take 2.5 mg by mouth 2 (two) times daily. 03/01/21  Yes Allred, Jeneen Rinks, MD  fluorometholone (FML) 0.1 % ophthalmic suspension Place 1 drop into the right eye daily as needed (eyes). 02/01/19  Yes [provider]  glimepiride (AMARYL) 1 MG tablet Take 0.5 mg by mouth daily.  04/07/20  Yes [provider]  lactulose (CHRONULAC) 10 GM/15ML solution TAKE 15 MLS (10 G TOTAL) BY MOUTH 3 (THREE) TIMES DAILY. Patient taking differently: Take 10 g by mouth 3 (three) times daily. 09/28/21  Yes Gorsuch, Ni, MD  lidocaine (LIDODERM) 5 % Place 1 patch onto the skin daily. 09/26/21  Yes [provider]   lidocaine-prilocaine (EMLA) cream Apply 1 application topically as needed [facility]).  05/26/20  Yes [provider]  LYRICA 50 MG capsule TAKE 1 CAPSULE (50 MG TOTAL) BY MOUTH AT BEDTIME. 09/05/21  Yes Gorsuch, Ni, MD  metoCLOPramide (REGLAN) 5 MG tablet TAKE 1 TABLET BY MOUTH 3 TIMES DAILY BEFORE MEALS Patient taking differently: Take 5 mg by mouth 3 (three) times daily before meals. 06/22/21  Yes Gorsuch, Ni,  MD  midodrine (PROAMATINE) 10 MG tablet Take 10 mg by mouth as directed. Low bp, and before dialysis 12/28/20  Yes [provider]  Multiple Vitamin (MULTIVITAMIN) tablet Take 1 tablet by mouth daily.   Yes [provider]  Nutritional Supplements (FEEDING SUPPLEMENT, NEPRO CARB STEADY,) LIQD Take 237 mLs by mouth 2 (two) times daily between meals. 01/08/21  Yes Lavina Hamman, MD  ondansetron (ZOFRAN) 4 MG tablet Take 1 tablet (4 mg total) by mouth every 8 (eight) hours as needed for nausea. 10/27/20  Yes Gorsuch, Ni, MD  oxyCODONE (OXY IR/ROXICODONE) 5 MG immediate release tablet Take 1 tablet (5 mg total) by mouth every 4 (four) hours as needed for severe pain. 03/17/21  Yes Gorsuch, Ni, MD  pantoprazole (PROTONIX) 40 MG tablet TAKE 1 TABLET (40 MG TOTAL) BY MOUTH DAILY AS NEEDED (ACID REFLUX/INDIGESTION.). 02/17/21  Yes Gorsuch, Ni, MD  polyethylene glycol (MIRALAX / GLYCOLAX) 17 g packet Take 17 g by mouth daily.   Yes [provider]  senna-docusate (SENOKOT-S) 8.6-50 MG tablet Take 2 tablets by mouth 2 (two) times daily. 03/11/19  Yes Nita Sells, MD  sevelamer carbonate (RENVELA) 800 MG tablet Take 800 mg by mouth in the morning, at noon, and at bedtime. 09/13/21  Yes [provider]    Allergies    Aspirin, Hydrocodone-acetaminophen, Morphine, Penicillins, Betaine, Dacarbazine, Ketorolac tromethamine, Brimonidine, Diltiazem hcl, Hydrocodone-acetaminophen, Neurontin [gabapentin], and Sulfacetamide sodium  Review of Systems   Review of  Systems 10 systems reviewed and negative except as per HPI Physical Exam Updated Vital Signs BP (!) 113/58    Pulse 72    Temp 97.7 F (36.5 C) (Oral)    Resp 11    Ht 5' 5.5" (1.664 m)    Wt 61.3 kg    SpO2 98%    BMI 22.15 kg/m   Physical Exam Constitutional:      Comments: Alert with clear mental status.  No respiratory distress at rest.  HENT:     Mouth/Throat:     Mouth: Mucous membranes are dry.  Eyes:     Extraocular Movements: Extraocular movements intact.  Cardiovascular:     Comments:  regular with intermittent ectopic beats. Pulmonary:     Effort: Pulmonary effort is normal.     Breath sounds: Normal breath sounds.  Abdominal:     General: There is no distension.     Palpations: Abdomen is soft.     Tenderness: There is no abdominal tenderness. There is no guarding.  Musculoskeletal:     Cervical back: Neck supple.     Comments: No peripheral edema.  Calves are soft and nontender.  Feet are warm and dry and well-perfused.  Skin:    General: Skin is warm and dry.  Neurological:     Comments: Patient is able to move both feet at this point.  She can hold the right leg off the bed for a few seconds.  She can hold the left leg off the bed but shorter duration with more effort.  Grip strength symmetric both upper extremities.  Cognitive function normal.  Speech normal no aphasia.  CN intact.  Psychiatric:        Mood and Affect: Mood normal.    ED Results / Procedures / Treatments   Labs (all labs ordered are listed, but only abnormal results are displayed) Labs Reviewed  COMPREHENSIVE METABOLIC PANEL - Abnormal; Notable for the following components:      Result Value   Sodium  131 (*)    Chloride 89 (*)    BUN 87 (*)    Creatinine, Ser 7.86 (*)    Albumin 3.2 (*)    Alkaline Phosphatase 32 (*)    GFR, Estimated 5 (*)    Anion gap 16 (*)    All other components within normal limits  CBC WITH DIFFERENTIAL/PLATELET - Abnormal; Notable for the following  components:   RBC 3.01 (*)    Hemoglobin 9.7 (*)    HCT 30.7 (*)    MCV 102.0 (*)    RDW 17.6 (*)    Neutro Abs 8.0 (*)    All other components within normal limits  TROPONIN I (HIGH SENSITIVITY) - Abnormal; Notable for the following components:   Troponin I (High Sensitivity) 288 (*)    All other components within normal limits  TROPONIN I (HIGH SENSITIVITY) - Abnormal; Notable for the following components:   Troponin I (High Sensitivity) 293 (*)    All other components within normal limits  RESP PANEL BY RT-PCR (FLU A&B, COVID) ARPGX2  LIPASE, BLOOD  PROTIME-INR    EKG EKG Interpretation  Date/Time:  Tuesday October 11 2021 11:59:20 EST Ventricular Rate:  70 PR Interval:  209 QRS Duration: 198 QT Interval:  505 QTC Calculation: 545 R Axis:   -70 Text Interpretation: intermittent pacing with PVCs voltage change in some leads compared to previous Confirmed by Charlesetta Shanks [id number]) on 10/11/2021 12:16:43 PM  Radiology DG Chest Port 1 View  Result Date: 10/11/2021 CLINICAL DATA:  Chest pain EXAM: PORTABLE CHEST 1 VIEW COMPARISON:  01/12/2021 FINDINGS: Transverse diameter of heart is increased. There are no signs of pulmonary edema or focal pulmonary consolidation. There is no pleural effusion or pneumothorax. Pacemaker battery is seen in the left infraclavicular region with tips of leads in right atrium and right ventricle. IMPRESSION: Cardiomegaly. There are no signs of pulmonary edema or focal pulmonary consolidation. Electronically Signed   By: Elmer Picker M.D.   On: 10/11/2021 13:09    Procedures Procedures   Medications Ordered in ED Medications  allopurinol (ZYLOPRIM) tablet 50 mg (has no administration in time range)  oxyCODONE (Oxy IR/ROXICODONE) immediate release tablet 5 mg (has no administration in time range)  acyclovir (ZOVIRAX) tablet 400 mg (has no administration in time range)  citalopram (CELEXA) tablet 10 mg (has no administration in time  range)  lactulose (CHRONULAC) 10 GM/15ML solution 10 g (has no administration in time range)  metoCLOPramide (REGLAN) tablet 5 mg (has no administration in time range)  polyethylene glycol (MIRALAX / GLYCOLAX) packet 17 g (has no administration in time range)  senna-docusate (Senokot-S) tablet 2 tablet (has no administration in time range)  sevelamer carbonate (RENVELA) tablet 800 mg (has no administration in time range)  apixaban (ELIQUIS) tablet 2.5 mg (has no administration in time range)  pregabalin (LYRICA) capsule 50 mg (has no administration in time range)  acetaminophen (TYLENOL) tablet 650 mg (has no administration in time range)    Or  acetaminophen (TYLENOL) suppository 650 mg (has no administration in time range)  sorbitol 70 % solution 30 mL (has no administration in time range)  docusate sodium (ENEMEEZ) enema 283 mg (has no administration in time range)  ondansetron (ZOFRAN) tablet 4 mg (has no administration in time range)    Or  ondansetron (ZOFRAN) injection 4 mg (has no administration in time range)  camphor-menthol (SARNA) lotion 1 application (has no administration in time range)    And  hydrOXYzine (ATARAX) tablet 25 mg (  has no administration in time range)  calcium carbonate (dosed in mg elemental calcium) suspension 500 mg of elemental calcium (has no administration in time range)  feeding supplement (NEPRO CARB STEADY) liquid 237 mL (has no administration in time range)  dexamethasone (DECADRON) tablet 1 mg (has no administration in time range)  dexamethasone (DECADRON) tablet 2 mg (has no administration in time range)  midodrine (PROAMATINE) tablet 10 mg (has no administration in time range)  midodrine (PROAMATINE) tablet 10 mg (has no administration in time range)    ED Course  I have reviewed the triage vital signs and the nursing notes.  Pertinent labs & imaging results that were available during my care of the patient were reviewed by me and considered in  my medical decision making (see chart for details).  Clinical Course as of 10/11/21 1616  Tue Oct 11, 2021  1418 Consult: Reviewed with Dr. Acie Fredrickson cardiology.  Patient troponins are chronically elevated.  EKG reviewed with paced rhythm.  At this time patient is pain-free and low suspicion for cardiac etiology of event.   [MP]    Clinical Course User Index [MP] Charlesetta Shanks, MD   MDM Rules/Calculators/A&P                         Consult: Reviewed with [facility] hospitalist Dr. Lorin Mercy for admission.  Patient presents with 2 episodes of extremity weakness and near collapse.  No associated traumatic injury.  Patient was with her son who prevented her from collapsing to the ground.  He has been having problems with ongoing thoracic back pain and variable low back pain.  Although at this time, patient is not endorsing any lower back pain and there are no reproducible elements of bony point tenderness.  Patient does describe an episode over the weekend of pain radiating into her armpit and chest.  Troponins are mildly elevated above patient's baseline which at baseline are in the 100s.  Patient does not currently have any active chest pain.  At this time we will proceed with diagnostic evaluation including CT of the T-spine and L-spine.  Neurologic exam patient can elevate both lower extremities off the bed and hold them with slight increased weakness on the left relative to the right.  Physical exam suggest good peripheral perfusion at this time do not have suspicion for arterial insufficiency.  With several episodes of lower extremity weakness or generalized weakness and severe comorbid illness, will plan for admission observation further diagnostic evaluation is needed.   Final Clinical Impression(s) / ED Diagnoses Final diagnoses:  ESRD on dialysis [facility])  Chronic left-sided thoracic back pain  Gait instability    Rx / DC Orders ED Discharge Orders     None        Charlesetta Shanks,  MD 10/16/21 [phone]

## 2021-10-11 NOTE — ED Triage Notes (Signed)
Pt arrives via EMS from home for bilateral leg weakness starting this morning. Pt is a Tuesday, Thursday, Saturday dialysis pt. Last treatment Sat. Was leaving to go to dialysis this am and was unable to walk due to weakness. Pt also c/o pain under left shoulder blade. EMS states that pt said pain radiated to chest but pt denies CP at this time.

## 2021-10-11 NOTE — ED Notes (Signed)
Patient is resting comfortably. 

## 2021-10-11 NOTE — Assessment & Plan Note (Signed)
-  Patient on chronic TTS HD -Nephrology prn order set utilized -She does not appear to be volume overloaded or otherwise in need of acute HD but was due today so likely needs prior to d/c -Nephrology was notified that patient will need HD

## 2021-10-11 NOTE — Assessment & Plan Note (Signed)
-  Denies recent chest pain -Troponin is flat -EDP d/w Dr. Acie Fredrickson who recommends no further evaluation at this time

## 2021-10-11 NOTE — Consult Note (Addendum)
Renal Service Consult Note [facility]  [name] 10/11/2021 Sol Blazing, MD Requesting Physician: Dr. Lorin Mercy  Reason for Consult: ESRD pt w/ gen'd weakness HPI: The patient is a 87 y.o. year-old w/ hx of anemia, ESRD on HD, glaucoma, gout, HTN, multiple myeloma, OSA, sp PPM presented to ED w/ bilat LE weakness. Did not get to HD today due to this. Pt is for admission. Asked to see for ESRD.    Pt seen in ED.  Wants to eat.  Lives w/ her husband and son, they drive her to HD.  On HD for about 2 years. Good compliance.    ROS - denies CP, no joint pain, no HA, no blurry vision, no rash, no diarrhea, no nausea/ vomiting, no dysuria, no difficulty voiding   Past Medical History  Past Medical History:  Diagnosis Date   Anemia    Arthritis    Benign paroxysmal positional vertigo 04/06/2010   CAROTID BRUIT 53/97/6734   Complication of anesthesia    took a long time to wake up with knee replacement   DIABETES MELLITUS, TYPE II 02/24/2008   ESRD (end stage renal disease) on dialysis ([place])    tues thurs sat [facility]w kidney center sees [place] [facility]   GERD (gastroesophageal reflux disease)    Glaucoma    Gout    HYPERTENSION 02/24/2008   Multiple myeloma ([place])    Obstructive sleep apnea    Pancytopenia ([place])    Sick sinus syndrome ([place])    now with pacemaker   Wears dentures    Wears glasses    Past Surgical History  Past Surgical History:  Procedure Laterality Date   A/V FISTULAGRAM Right 03/08/2020   Procedure: A/V FISTULAGRAM- Right Arm;  Surgeon: Waynetta Sandy, MD;  Location: [place] [facility];  Service: Cardiovascular;  Laterality: Right;   A/V FISTULAGRAM N/A 06/28/2020   Procedure: A/V FISTULAGRAM - Right Upper Arm;  Surgeon: Waynetta Sandy, MD;  Location: [place] [facility];  Service: Cardiovascular;  Laterality: N/A;   ABDOMINAL HYSTERECTOMY     1980's   AV FISTULA PLACEMENT Right 03/11/2019   Procedure: CREATION RIGHT ARM  RADIOCEPHALIC ARTERIOVENOUS FISTULA;  Surgeon: Angelia Mould, MD;  Location: [place];  Service: Vascular;  Laterality: Right;   AV FISTULA PLACEMENT Right 03/25/2020   Procedure: right arm ARTERIOVENOUS (AV) FISTULA CREATION;  Surgeon: Waynetta Sandy, MD;  Location: [place];  Service: Vascular;  Laterality: Right;   BACK SURGERY     x 3   BIOPSY  06/24/2019   Procedure: BIOPSY;  Surgeon: Ronald Lobo, MD;  Location: [facility];  Service: Endoscopy;;   BUBBLE STUDY  01/07/2021   Procedure: BUBBLE STUDY;  Surgeon: Sueanne Margarita, MD;  Location: [place];  Service: Cardiovascular;;   CARDIOVERSION N/A 01/07/2021   Procedure: CARDIOVERSION;  Surgeon: Sueanne Margarita, MD;  Location[facility];  Service: Cardiovascular;  Laterality: N/A;   EP IMPLANTABLE DEVICE N/A 05/16/2016   Procedure: PPM Generator Changeout;  Surgeon: Thompson Grayer, MD;  Location: [place] [facility];  Service: Cardiovascular;  Laterality: N/A;   ESOPHAGOGASTRODUODENOSCOPY (EGD) WITH PROPOFOL N/A 06/24/2019   Procedure: ESOPHAGOGASTRODUODENOSCOPY (EGD) WITH PROPOFOL;  Surgeon: Ronald Lobo, MD;  Location: [facility];  Service: Endoscopy;  Laterality: N/A;   FISTULA SUPERFICIALIZATION Right 03/11/2019   Procedure: FISTULA SUPERFICIALIZATION;  Surgeon: Angelia Mould, MD;  Location[facility] OR;  Service: Vascular;  Laterality: Right;   INSERTION OF DIALYSIS CATHETER Right 03/25/2020  Procedure: INSERTION OF DIALYSIS CATHETER, right internal jugular;  Surgeon: Waynetta Sandy, MD;  Location: [place];  Service: Vascular;  Laterality: Right;   IR FLUORO GUIDE CV LINE RIGHT  03/07/2019   IR US GUIDE VASC ACCESS RIGHT  03/07/2019   KNEE SURGERY Right    2003   KNEE SURGERY Left    2011   PACEMAKER INSERTION     PERIPHERAL VASCULAR BALLOON ANGIOPLASTY Right 06/28/2020   Procedure: PERIPHERAL VASCULAR BALLOON ANGIOPLASTY;  Surgeon: Waynetta Sandy, MD;  Location: [place] [facility];  Service:  Cardiovascular;  Laterality: Right;   TEE WITHOUT CARDIOVERSION N/A 01/07/2021   Procedure: TRANSESOPHAGEAL ECHOCARDIOGRAM (TEE);  Surgeon: Sueanne Margarita, MD;  Location: [facility] ENDOSCOPY;  Service: Cardiovascular;  Laterality: N/A;   Family History  Family History  Problem Relation Age of Onset   Congestive Heart Failure Mother    Tuberculosis Mother    Multiple myeloma Mother    Bone cancer Father    Arthritis Father    Breast cancer Sister    Colon cancer Sister    Hypertension Sister    Dementia Sister    Alcohol abuse Son    Social History  reports that she has never smoked. She has never used smokeless tobacco. She reports that she does not drink alcohol and does not use drugs. Allergies  Allergies  Allergen Reactions   Aspirin Other (See Comments)    REACTION: STOMACH ISSUES WITH DOSE HIGHER THAN 81 MG  Other reaction(s): GI Upset (intolerance), Other (See Comments) REACTION: STOMACH ISSUES WITH DOSE HIGHER THAN 81 MG  REACTION: STOMACH ISSUES WITH DOSE HIGHER THAN 81 MG    Hydrocodone-Acetaminophen Nausea And Vomiting   Morphine Nausea And Vomiting   Penicillins Rash    Patient took injection and tablets, had a reaction. She has taken amoxicillin with no reaction Has patient had a PCN reaction causing immediate rash, facial/tongue/throat swelling, SOB or lightheadedness with hypotension: Yes Has patient had a PCN reaction causing severe rash involving mucus membranes or skin necrosis: Yes Has patient had a PCN reaction that required hospitalization No Has patient had a PCN reaction occurring within the last 10 years: No If all of the above answers are "NO", then m Patient took injection and tablets, had a reaction. She has taken amoxicillin with no reaction Patient took injection and tablets, had a reaction. She has taken amoxicillin with no reaction Has patient had a PCN reaction causing immediate rash, facial/tongue/throat swelling, SOB or lightheadedness with hypotension:  Yes Has patient had a PCN reaction causing severe rash involving mucus membranes or skin necrosis: Yes Has patient had a PCN reaction that required hospitalization No Has patient had a PCN reaction occurring within the last 10 years: No If all of the above answers are "NO", then m   Betaine Nausea And Vomiting   Dacarbazine Nausea And Vomiting   Ketorolac Tromethamine Itching   Brimonidine Itching   Diltiazem Hcl Rash   Hydrocodone-Acetaminophen Nausea And Vomiting   Neurontin [Gabapentin] Nausea And Vomiting   Sulfacetamide Sodium Rash   Home medications Prior to Admission medications   Medication Sig Start Date End Date Taking? Authorizing Provider  acetaminophen (TYLENOL) 500 MG tablet Take 1,000 mg by mouth 2 (two) times daily as needed for mild pain or moderate pain.    Yes [provider]  acyclovir (ZOVIRAX) 400 MG tablet Take 1 tablet (400 mg total) by mouth daily. 09/15/20  Yes Heath Lark, MD  allopurinol (ZYLOPRIM) 100 MG  tablet Take 0.5 tablets (50 mg total) by mouth daily. 03/12/19  Yes Nita Sells, MD  B Complex-C-Zn-Folic Acid (DIALYVITE 297-LGXQ 15) 0.8 MG TABS Take 1 tablet by mouth daily. 07/05/21  Yes [provider]  cholecalciferol (VITAMIN D3) 25 MCG (1000 UT) tablet Take 2,000 Units by mouth daily.   Yes [provider]  citalopram (CELEXA) 10 MG tablet Take 10 mg by mouth daily.   Yes [provider]  dexamethasone (DECADRON) 1 MG tablet Take 1 tablet (1 mg total) by mouth daily. Patient taking differently: Take 1 mg by mouth every evening. 09/06/21  Yes Gorsuch, Ni, MD  dexamethasone (DECADRON) 2 MG tablet TAKE 1 TABLET BY MOUTH EVERY DAY Patient taking differently: Take 2 mg by mouth daily. 08/15/21  Yes Gorsuch, Ni, MD  ELIQUIS 2.5 MG TABS tablet TAKE 1 TABLET BY MOUTH TWICE A DAY Patient taking differently: Take 2.5 mg by mouth 2 (two) times daily. 03/01/21  Yes Allred, Jeneen Rinks, MD  fluorometholone (FML) 0.1 % ophthalmic  suspension Place 1 drop into the right eye daily as needed (eyes). 02/01/19  Yes [provider]  glimepiride (AMARYL) 1 MG tablet Take 0.5 mg by mouth daily.  04/07/20  Yes [provider]  lactulose (CHRONULAC) 10 GM/15ML solution TAKE 15 MLS (10 G TOTAL) BY MOUTH 3 (THREE) TIMES DAILY. Patient taking differently: Take 10 g by mouth 3 (three) times daily. 09/28/21  Yes Gorsuch, Ni, MD  lidocaine (LIDODERM) 5 % Place 1 patch onto the skin daily. 09/26/21  Yes [provider]  lidocaine-prilocaine (EMLA) cream Apply 1 application topically as needed [facility]).  05/26/20  Yes [provider]  LYRICA 50 MG capsule TAKE 1 CAPSULE (50 MG TOTAL) BY MOUTH AT BEDTIME. 09/05/21  Yes Gorsuch, Ni, MD  metoCLOPramide (REGLAN) 5 MG tablet TAKE 1 TABLET BY MOUTH 3 TIMES DAILY BEFORE MEALS Patient taking differently: Take 5 mg by mouth 3 (three) times daily before meals. 06/22/21  Yes Gorsuch, Ni, MD  midodrine (PROAMATINE) 10 MG tablet Take 10 mg by mouth as directed. Low bp, and before dialysis 12/28/20  Yes [provider]  Multiple Vitamin (MULTIVITAMIN) tablet Take 1 tablet by mouth daily.   Yes [provider]  Nutritional Supplements (FEEDING SUPPLEMENT, NEPRO CARB STEADY,) LIQD Take 237 mLs by mouth 2 (two) times daily between meals. 01/08/21  Yes Lavina Hamman, MD  ondansetron (ZOFRAN) 4 MG tablet Take 1 tablet (4 mg total) by mouth every 8 (eight) hours as needed for nausea. 10/27/20  Yes Gorsuch, Ni, MD  oxyCODONE (OXY IR/ROXICODONE) 5 MG immediate release tablet Take 1 tablet (5 mg total) by mouth every 4 (four) hours as needed for severe pain. 03/17/21  Yes Gorsuch, Ni, MD  pantoprazole (PROTONIX) 40 MG tablet TAKE 1 TABLET (40 MG TOTAL) BY MOUTH DAILY AS NEEDED (ACID REFLUX/INDIGESTION.). 02/17/21  Yes Gorsuch, Ni, MD  polyethylene glycol (MIRALAX / GLYCOLAX) 17 g packet Take 17 g by mouth daily.   Yes [provider]  senna-docusate (SENOKOT-S)  8.6-50 MG tablet Take 2 tablets by mouth 2 (two) times daily. 03/11/19  Yes Nita Sells, MD  sevelamer carbonate (RENVELA) 800 MG tablet Take 800 mg by mouth in the morning, at noon, and at bedtime. 09/13/21  Yes [provider]     Vitals:   10/11/21 1415 10/11/21 1430 10/11/21 1445 10/11/21 1500  BP: (!) 100/58 (!) 92/49 104/65 (!) 113/58  Pulse: 76 70 69 72  Resp: 13 10  11  Temp:      TempSrc:      SpO2: 98% 97% 94% 98%  Weight:      Height:       Exam Gen alert, no distress No rash, cyanosis or gangrene Sclera anicteric, throat clear  No jvd or bruits Chest clear bilat to bases, no rales/ wheezing RRR no MRG Abd soft ntnd no mass or ascites +bs GU normal MS no joint effusions or deformity Ext no LE or UE edema, no wounds or ulcers Neuro is alert, Ox 3 , nf  RUA AVF+bruit   Home meds include - zyloprim, celexa, decadron, eliquis, amaryl, lactulose, lyrica, reglan, midodrine pre hd and prn, PPI, MVI, renvela 800 tid, prns/  vits/ supps   CXR 12/27 -  IMPRESSION: Cardiomegaly. There are no signs of pulmonary edema or focal pulmonary consolidation.    OP HD: [facility]  4h   350/500  59.9kg  2/2.5Ca bath  RUA AVF Hep 2000  - mircera 75 ug q4 wks, last 12/15    Assessment/ Plan: Generalized weakness - not sure cause, K+ ok, well dialyzed. Deconditioning prob contributing. Per pmd.  ESRD - on HD TTS. No need for acute HD and due to high census will postpone her HD til tomorrow.  Hypotension/ vol - no vol excess on exam. BP's soft. Takes midodrine in OP setting pre HD. Minimal UF goal w/ HD tomorrow.  MBD ckd - cont binders, Ca in range Anemia ckd  - Hb 9.7, no esa needs for now HFrEF - hx of EF 25-30%. No pulm edema on CXR, exam neg for edema.   Debility /deconditioning - looks like long-term issue      Kelly Splinter  MD 10/11/2021, 4:29 PM  Recent Labs  Lab 10/11/21 1220  WBC 9.3  HGB 9.7*   Recent Labs  Lab 10/11/21 1220  K 4.5  BUN 87*   CREATININE 7.86*  CALCIUM 9.3

## 2021-10-12 ENCOUNTER — Telehealth: Payer: Self-pay

## 2021-10-12 DIAGNOSIS — R262 Difficulty in walking, not elsewhere classified: Secondary | ICD-10-CM | POA: Diagnosis not present

## 2021-10-12 LAB — CBC
HCT: 28 % — ABNORMAL LOW (ref 36.0–46.0)
Hemoglobin: 8.8 g/dL — ABNORMAL LOW (ref 12.0–15.0)
MCH: 31.8 pg (ref 26.0–34.0)
MCHC: 31.4 g/dL (ref 30.0–36.0)
MCV: 101.1 fL — ABNORMAL HIGH (ref 80.0–100.0)
Platelets: 188 10*3/uL (ref 150–400)
RBC: 2.77 MIL/uL — ABNORMAL LOW (ref 3.87–5.11)
RDW: 17.3 % — ABNORMAL HIGH (ref 11.5–15.5)
WBC: 8 10*3/uL (ref 4.0–10.5)
nRBC: 0 % (ref 0.0–0.2)

## 2021-10-12 LAB — BASIC METABOLIC PANEL
Anion gap: 18 — ABNORMAL HIGH (ref 5–15)
BUN: 101 mg/dL — ABNORMAL HIGH (ref 8–23)
CO2: 22 mmol/L (ref 22–32)
Calcium: 8.8 mg/dL — ABNORMAL LOW (ref 8.9–10.3)
Chloride: 89 mmol/L — ABNORMAL LOW (ref 98–111)
Creatinine, Ser: 8.01 mg/dL — ABNORMAL HIGH (ref 0.44–1.00)
GFR, Estimated: 4 mL/min — ABNORMAL LOW (ref >60)
Glucose, Bld: 70 mg/dL (ref 70–99)
Potassium: 4.8 mmol/L (ref 3.5–5.1)
Sodium: 129 mmol/L — ABNORMAL LOW (ref 135–145)

## 2021-10-12 NOTE — Telephone Encounter (Signed)
I am on call tomorrow, will see her in the hospital. Likely after 4 pm before I can get there

## 2021-10-12 NOTE — Progress Notes (Signed)
PT Cancellation Note  Patient Details Name: NOVELLA ABRAHA MRN: [id number] DOB: [birth year]   Cancelled Treatment:    Reason Eval/Treat Not Completed: Patient at procedure or test/unavailable (HD)  Wyona Almas, PT, DPT Acute Rehabilitation Services Pager [phone] Office [phone]    Deno Etienne 10/12/2021, 10:22 AM

## 2021-10-12 NOTE — ED Notes (Signed)
Consent signed for hemodialysis. CHG bath given

## 2021-10-12 NOTE — TOC Initial Note (Addendum)
Transition of Care [facility]) - Initial/Assessment Note    Patient Details  Name: [name] MRN: [id number] Date of Birth: 01/25/1930  [facility]) CM/SW Contact:    Marilu Favre, RN Phone Number: 10/12/2021, 4:51 PM  Clinical Narrative:                  Talked to patient and son [name] at bedside. Patient from home with son,, has DME.   Discussed PT recommendation for HHPT, both in agreement. NCM offered choice. She has had [facility] in past and would like [facility] again. Tommi Rumps with [facility] accepted for HHPT/OT    Will need orders and face to face  Expected Discharge Plan: [place] Barriers to Discharge: Continued Medical Work up   Patient Goals and CMS Choice Patient states their goals for this hospitalization and ongoing recovery are:: to return to home CMS Medicare.gov Compare Post Acute Care list provided to:: Patient Choice offered to / list presented to : Patient  Expected Discharge Plan and Services Expected Discharge Plan: [place]   Discharge Planning Services: CM Consult Post Acute Care Choice: [place] arrangements for the past 2 months: Single Family Home                   DME Agency: NA       HH Arranged: PT HH Agency: [facility] Date [place]: 10/12/21 Time [place]: 7209 Representative spoke with at [place]: Tommi Rumps awaiting call back  Prior Living Arrangements/Services Living arrangements for the past 2 months: Single Family Home Lives with:: Adult Children Patient language and need for interpreter reviewed:: Yes Do you feel safe going back to the place where you live?: Yes      Need for Family Participation in Patient Care: Yes (Comment) Care giver support system in place?: Yes (comment) Current home services: DME Criminal Activity/Legal Involvement Pertinent to Current Situation/Hospitalization: No - Comment as needed  Activities of Daily Living       Permission Sought/Granted   Permission granted to share information with : Yes, Verbal Permission Granted  Share Information with NAME: son [name]  Permission granted to share info w AGENCY: [facility]        Emotional Assessment Appearance:: Appears stated age Attitude/Demeanor/Rapport: Engaged Affect (typically observed): Accepting Orientation: : Oriented to Self, Oriented to Place, Oriented to  Time, Oriented to Situation Alcohol / Substance Use: Not Applicable Psych Involvement: No (comment)  Admission diagnosis:  ESRD on dialysis ([place]) [N18.6, Z99.2] Metastatic multiple myeloma to bone (HCC) [C90.00] Generalized weakness [R53.1] Chronic left-sided thoracic back pain [M54.6, G89.29] Patient Active Problem List   Diagnosis Date Noted   Ambulatory dysfunction 10/11/2021   Metastatic multiple myeloma to bone ([place]) 10/11/2021   Protein-calorie malnutrition, severe 12/31/2020   Atrial flutter ([place]) 12/31/2020   Fluid overload 12/30/2020   Weight loss, non-intentional 12/10/2020   Pain due to onychomycosis of toenails of both feet 12/08/2020   Type 2 diabetes mellitus with vascular disease ([place]) 12/08/2020   Persistent atrial fibrillation ([place]) 11/08/2020   Gastritis 10/27/2020   Delayed gastric emptying 10/27/2020   Atypical atrial flutter ([place]) 10/25/2020   Secondary hypercoagulable state ([place]) 10/25/2020   Pulmonary edema 09/10/2020   Pleural effusion, bilateral 09/10/2020   Acute respiratory failure with hypoxia (HCC) 04/02/2020   Elevated troponin 04/02/2020   Hypotension 03/23/2020   Left hip pain 01/12/2020   Vitamin B12 deficiency 10/20/2019  Deficiency anemia 09/26/2019   Anaphylactic shock, unspecified, sequela 49/97/1820   Complication of vascular dialysis catheter 07/21/2019   Peripheral neuropathy due to chemotherapy ([place]) 07/04/2019   Encounter for removal of sutures 07/03/2019   Goals of care, counseling/discussion 05/30/2019   Pressure injury of skin  05/26/2019   Yeast infection of the skin 04/04/2019   Cancer associated pain 03/26/2019   Moderate protein-calorie malnutrition ([place]) 03/24/2019   Nausea in adult 03/14/2019   ESRD (end stage renal disease) on dialysis ([place]) 03/14/2019   Other constipation 03/14/2019   Coagulation defect, unspecified ([place]) 03/13/2019   Pancytopenia, acquired ([place]) 03/12/2019   Anemia in chronic kidney disease 03/11/2019   Bradycardia, unspecified 03/11/2019   Iron deficiency anemia, unspecified 03/11/2019   Secondary hyperparathyroidism of renal origin ([place]) 03/11/2019   Multiple myeloma not having achieved remission ([place]) 03/03/2019   Hypokalemia 02/25/2019   Anemia    Shingles 03/09/2015   Sick sinus syndrome ([place]) 02/03/2015   Shortness of breath 02/01/2014   Fatigue 02/01/2014   BENIGN PAROXYSMAL POSITIONAL VERTIGO 04/06/2010   BRADYCARDIA 01/27/2009   Diabetes mellitus ([place]) 02/24/2008   Obstructive sleep apnea 02/24/2008   PERIODIC LIMB MOVEMENT DISORDER 02/24/2008   ALLERGIC RHINITIS 02/24/2008   CAROTID BRUIT 02/24/2008   PCP:  Lucianne Lei, MD Pharmacy:   [id number] [place]Phone: [phone]: [phone] [address] [id number]: [phone]: [phone]    Social Determinants of Health (SDOH) Interventions    Readmission Risk Interventions Readmission Risk Prevention Plan 04/04/2020 03/03/2019  Transportation Screening Complete Complete  PCP or Specialist Appt within 3-5 Days - Not Complete  Not Complete comments - not yet ready for d/c  HRI or [place]- Not Complete  HRI or Home Care Consult comments - no needs at this time  Social Work Consult for RMc[place]Planning/Counseling - Not Complete  SW consult not completed comments - no needs at this time  Palliative Care Screening -  Not Applicable  Medication Review (RRupert Complete Complete  PCP or Specialist appointment within 3-5 days of discharge (No Data) -  [facility] Home Care Consult Complete -  SW Recovery Care/Counseling Consult Complete -  Palliative Care Screening Not Applicable -  SOmerNot Applicable -  Some recent data might be hidden

## 2021-10-12 NOTE — Progress Notes (Signed)
PROGRESS NOTE   [name]  NID:[id number] DOB: 1930-09-02 DOA: 10/11/2021 PCP: Lucianne Lei, MD  Brief Narrative:  87 year old black female ESRD on HD TTS since 2020  DM TY 2 Multiple myeloma IgG type followed by Dr. 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  Tumor and L3-L4 stenosis Therapy to evaluate patient and assess Not sure if XRT would be something appropriate for her in terms of palliation Await input from oncology Continue Decadron low-dose 2 mg daily for bony pain Continue Oxy IR 5 every 4 as needed carefully for severe pain Continue Lyrica 50 at bedtime ESRD TTS Requires midodrine 10 3 times daily Continue calcium carbonate 500 3 times daily, continue Renvela 800 3 times daily Atrial flutter CHA2DS2-VASc >4 Continue Eliquis 2.5 twice daily DM TY 2 Home med Amaryl 0.5 mg daily-hold for now, sliding scale Depression Continue citalopram 10 daily Constipation Continue Reglan 5 3 times daily meals, lactulose 15 mils 3 times daily, MiraLAX 17 g daily and Senokot p.o.  DVT prophylaxis: Eliquis Code Status: Full Family Communication: None present currently Disposition:  Status is: Inpatient  Remains inpatient appropriate because: Cannot ambulate       Consultants:  Renal Oncology  Procedures:  Known  Antimicrobials:  No   Subjective:  Awake coherent pleasant no distress seen on HD unit Tells me pain is  moderately controlled when she is just laying there Seems to have mild difficulty recalling certain things  Objective: Vitals:   10/12/21 0930 10/12/21 1000 10/12/21 1030 10/12/21 1100  BP: (!) 104/57 115/61 (!) 117/58 134/67  Pulse: 67 71 64 73  Resp:      Temp:      TempSrc:      SpO2:      Weight:      Height:        Intake/Output Summary (Last 24 hours) at 10/12/2021 1209 Last data filed at 10/12/2021 1130 Gross per 24 hour  Intake --  Output 2000 ml  Net -2000 ml   Filed Weights   10/11/21 1150  Weight: 61.3 kg    Examination:  Coherent pleasant no distress S1-S2 seems regular rate rhythm ROM intact Chest clear no added sound no rales no rhonchi Straight leg raise nonantalgic Sensory is intact bilaterally to cold touch Abdomen soft nontender  Data Reviewed: personally reviewed   CBC    Component Value Date/Time   WBC 8.0 10/12/2021 0500   RBC 2.77 (L) 10/12/2021 0500   HGB 8.8 (L) 10/12/2021 0500   HCT 28.0 (L) 10/12/2021 0500   PLT 188 10/12/2021 0500   MCV 101.1 (H) 10/12/2021 0500   MCH 31.8 10/12/2021 0500   MCHC 31.4 10/12/2021 0500   RDW 17.3 (H) 10/12/2021 0500   LYMPHSABS 0.7 10/11/2021 1220   MONOABS 0.6 10/11/2021 1220   EOSABS 0.0 10/11/2021 1220   BASOSABS 0.0 10/11/2021 1220   CMP Latest Ref Rng & Units 10/12/2021 10/11/2021 07/25/2021  Glucose 70 - 99 mg/dL 70 96 119(H)  BUN 8 - 23  mg/dL 101(H) 87(H) 63(H)  Creatinine 0.44 - 1.00 mg/dL 8.01(H) 7.86(H) 4.48(HH)  Sodium 135 - 145 mmol/L 129(L) 131(L) 134(L)  Potassium 3.5 - 5.1 mmol/L 4.8 4.5 4.3  Chloride 98 - 111 mmol/L 89(L) 89(L) 97(L)  CO2 22 - 32 mmol/L _0 Calcium 8.9 - 10.3 mg/dL 8.8(L) 9.3 8.8(L)  Total Protein 6.5 - 8.1 g/dL - 6.8 6.3(L)  Total Bilirubin 0.3 - 1.2 mg/dL - 0.6 0.4  Alkaline Phos 38 - 126 U/L - 32(L) 42  AST 15 - 41 U/L - 20 22  ALT 0 - 44 U/L - 17 17     Radiology Studies: CT Thoracic Spine Wo Contrast  Result Date: 10/11/2021 CLINICAL DATA:   Multiple falls. Increased back pain. History of multiple myeloma. EXAM: CT THORACIC AND LUMBAR SPINE WITHOUT CONTRAST TECHNIQUE: Multidetector CT imaging of the thoracic and lumbar spine was performed without contrast. Multiplanar CT image reconstructions were also generated. COMPARISON:  CTA chest 09/10/2020. CT abdomen and pelvis 11/27/2019. FINDINGS: CT THORACIC SPINE FINDINGS Alignment: Unchanged grade 1 anterolisthesis of C7 on T1. Vertebrae: Numerous lytic lesions throughout the thoracic spine. Large destructive lesion involving the left aspect of the T1 vertebral body, left-sided T1 posterior elements, and adjacent left first rib with prominent extraosseous soft tissue component involving the left C7-T1 and T1-2 neural foramina, greatly progressed from the 09/10/2020 chest CTA. 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 New lesion involving the left lamina of T10 with cortical disruption and extraosseous tumor extension posteriorly. No acute fracture. Paraspinal and other soft tissues: Aortic and coronary atherosclerosis. Calcified right hilar lymph nodes. Disc levels: Diffuse thoracic disc degeneration. Mild-to-moderate spinal stenosis at T11-12 due to circumferential disc bulging, endplate spurring, and severe right and moderate left facet arthrosis. CT LUMBAR SPINE FINDINGS Segmentation: 5 lumbar type vertebrae. Alignment: Moderate lumbar dextroscoliosis. Chronic grade 1 anterolisthesis of L4 on L5. Vertebrae: Numerous lytic lesions throughout the lumbar spine and included sacrum including a dominant 2.5 cm lesion in the L1 vertebral body, similar to the 11/27/2019 CT of the abdomen and pelvis. No acute fracture. Prior Ray cage fusion at L4-5. Paraspinal and other soft tissues: Bilateral renal atrophy. Abdominal aortic atherosclerosis. Disc levels: Advanced disc and facet degeneration from L2-3 to L5-S1. Prior laminectomies at L2-3, L3-4, and L4-5. Moderate  spinal stenosis and severe left neural foraminal stenosis at L3-4 due to left eccentric disc bulging and posterior element hypertrophy. IMPRESSION: 1. Widespread lytic bone lesions consistent with multiple myeloma. 2. Large destructive lesion involving the left aspect of vertebral body and posterior elements of T1 and adjacent left first rib, greatly progressed from 09/10/2020. 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 3. No acute thoracic or lumbar spine fracture. 4. Advanced lumbar disc and facet degeneration with moderate spinal stenosis and severe left neural foraminal stenosis at L3-4. 5. Aortic Atherosclerosis (ICD10-I70.0). Electronically Signed   By: Logan Bores M.D.   On: 10/11/2021 17:20   CT Lumbar Spine Wo Contrast  Result Date: 10/11/2021 CLINICAL DATA:  Multiple falls. Increased back pain. History of multiple myeloma. EXAM: CT THORACIC AND LUMBAR SPINE WITHOUT CONTRAST TECHNIQUE: Multidetector CT imaging of the thoracic and lumbar spine was performed without contrast. Multiplanar CT image reconstructions were also generated. COMPARISON:  CTA chest 09/10/2020. CT abdomen and pelvis 11/27/2019. FINDINGS: CT THORACIC SPINE FINDINGS Alignment: Unchanged grade 1 anterolisthesis of C7 on T1. Vertebrae: Numerous lytic lesions throughout the thoracic spine. Large destructive  lesion involving the left aspect of the T1 vertebral body, left-sided T1 posterior elements, and adjacent left first rib with prominent extraosseous soft tissue component involving the left C7-T1 and T1-2 neural foramina, greatly progressed from the 09/10/2020 chest CTA. 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 New lesion involving the left lamina of T10 with cortical disruption and extraosseous tumor extension posteriorly. No acute fracture. Paraspinal and other soft tissues: Aortic and coronary atherosclerosis. Calcified right hilar lymph nodes. Disc  levels: Diffuse thoracic disc degeneration. Mild-to-moderate spinal stenosis at T11-12 due to circumferential disc bulging, endplate spurring, and severe right and moderate left facet arthrosis. CT LUMBAR SPINE FINDINGS Segmentation: 5 lumbar type vertebrae. Alignment: Moderate lumbar dextroscoliosis. Chronic grade 1 anterolisthesis of L4 on L5. Vertebrae: Numerous lytic lesions throughout the lumbar spine and included sacrum including a dominant 2.5 cm lesion in the L1 vertebral body, similar to the 11/27/2019 CT of the abdomen and pelvis. No acute fracture. Prior Ray cage fusion at L4-5. Paraspinal and other soft tissues: Bilateral renal atrophy. Abdominal aortic atherosclerosis. Disc levels: Advanced disc and facet degeneration from L2-3 to L5-S1. Prior laminectomies at L2-3, L3-4, and L4-5. Moderate spinal stenosis and severe left neural foraminal stenosis at L3-4 due to left eccentric disc bulging and posterior element hypertrophy. IMPRESSION: 1. Widespread lytic bone lesions consistent with multiple myeloma. 2. Large destructive lesion involving the left aspect of vertebral body and posterior elements of T1 and adjacent left first rib, greatly progressed from 09/10/2020. 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 3. No acute thoracic or lumbar spine fracture. 4. Advanced lumbar disc and facet degeneration with moderate spinal stenosis and severe left neural foraminal stenosis at L3-4. 5. Aortic Atherosclerosis (ICD10-I70.0). Electronically Signed   By: Logan Bores M.D.   On: 10/11/2021 17:20   DG Chest Port 1 View  Result Date: 10/11/2021 CLINICAL DATA:  Chest pain EXAM: PORTABLE CHEST 1 VIEW COMPARISON:  01/12/2021 FINDINGS: Transverse diameter of heart is increased. There are no signs of pulmonary edema or focal pulmonary consolidation. There is no pleural effusion or pneumothorax. Pacemaker battery is seen in the left infraclavicular region with tips of leads in right  atrium and right ventricle. IMPRESSION: Cardiomegaly. There are no signs of pulmonary edema or focal pulmonary consolidation. Electronically Signed   By: Elmer Picker M.D.   On: 10/11/2021 13:09     Scheduled Meds:  acyclovir  400 mg Oral Daily   allopurinol  50 mg Oral Daily   apixaban  2.5 mg Oral BID   Chlorhexidine Gluconate Cloth  6 each Topical Q0600   citalopram  10 mg Oral Daily   dexamethasone  1 mg Oral QHS   dexamethasone  2 mg Oral Daily   lactulose  10 g Oral TID   metoCLOPramide  5 mg Oral TID AC   polyethylene glycol  17 g Oral Daily   pregabalin  50 mg Oral QHS   senna-docusate  2 tablet Oral BID   sevelamer carbonate  800 mg Oral TID WC   Continuous Infusions:   LOS: 1 day   Time spent: [place], MD [facility] To contact the attending provider between 7A-7P or the covering provider during after hours 7P-7A, please log into the web site [email or web address] and access using universal [place] password for that web site. If you do not have the password, please call the hospital operator.  10/12/2021, 12:09 PM

## 2021-10-12 NOTE — Telephone Encounter (Signed)
Son called and left a message. His Mom is in the hospital and was admitted yesterday. She has been having back pain for a couple of months. Yesterday she lost the ability to stand. She had a CT scan yesterday and [name] is asking if you can review scan.

## 2021-10-12 NOTE — Progress Notes (Signed)
IP PROGRESS NOTE  Subjective:   I was asked by Dr. Lorin Mercy to comment on this patient's multiple myeloma status.  She is an 87 year old woman with history of end-stage renal disease currently on hemodialysis.  She was diagnosed with multiple myeloma in 2020 after presenting with bone lesions and bone marrow biopsy showed 30% plasma cell infiltration.  She was treated under the care of Dr. Rozetta Nunnery with Velcade, dexamethasone and Darzalex and achieved a good response.  She was evaluated in October and has been off treatment since that time.  She presented acutely to the emergency department with generalized pain, weakness and predominantly shoulder and neck pain.  Imaging studies including a CT scan of the lumbar spine and the thoracic spine showed widespread lytic lesions consistent with multiple myeloma with a large destructive lesion involving the left aspect of the vertebral body the posterior element of T1.  Suspected left-sided ventral epidural tumor at T1.  Laboratory data showed a decline in her hemoglobin from 11.3 to currently 8.8.  Clinically, she reports generalized fatigue, tiredness and overall weakness.  She has not reported any chest pain or shortness of breath.  She denies any neurological deficits.   Objective:  Vital signs in last 24 hours: Temp:  [97.7 F (36.5 C)-98.3 F (36.8 C)] 98.3 F (36.8 C) (12/28 1227) Pulse Rate:  [64-76] 70 (12/28 1227) Resp:  [10-22] 16 (12/28 1227) BP: (95-134)/(40-67) 121/63 (12/28 1227) SpO2:  [94 %-100 %] 100 % (12/28 1227) Weight change:     Intake/Output from previous day: No intake/output data recorded. General: Alert, awake without distress. Head: Normocephalic atraumatic. Mouth: mucous membranes moist, pharynx normal without lesions Eyes: No scleral icterus.  Pupils are equal and round reactive to light. Resp: clear to auscultation bilaterally without rhonchi or wheezes or dullness to percussion. Cardio: regular rate and rhythm,  S1, S2 normal, no murmur, click, rub or gallop GI: soft, non-tender; bowel sounds normal; no masses,  no organomegaly Musculoskeletal: No joint deformity or effusion. Neurological: No motor, sensory deficits.  Intact deep tendon reflexes. Skin: No rashes or lesions.    Lab Results: Recent Labs    10/11/21 1220 10/12/21 0500  WBC 9.3 8.0  HGB 9.7* 8.8*  HCT 30.7* 28.0*  PLT 208 188    BMET Recent Labs    10/11/21 1220 10/12/21 0500  NA 131* 129*  K 4.5 4.8  CL 89* 89*  CO2 26 22  GLUCOSE 96 70  BUN 87* 101*  CREATININE 7.86* 8.01*  CALCIUM 9.3 8.8*    Studies/Results: CT Thoracic Spine Wo Contrast  Result Date: 10/11/2021 CLINICAL DATA:  Multiple falls. Increased back pain. History of multiple myeloma. EXAM: CT THORACIC AND LUMBAR SPINE WITHOUT CONTRAST TECHNIQUE: Multidetector CT imaging of the thoracic and lumbar spine was performed without contrast. Multiplanar CT image reconstructions were also generated. COMPARISON:  CTA chest 09/10/2020. CT abdomen and pelvis 11/27/2019. FINDINGS: CT THORACIC SPINE FINDINGS Alignment: Unchanged grade 1 anterolisthesis of C7 on T1. Vertebrae: Numerous lytic lesions throughout the thoracic spine. Large destructive lesion involving the left aspect of the T1 vertebral body, left-sided T1 posterior elements, and adjacent left first rib with prominent extraosseous soft tissue component involving the left C7-T1 and T1-2 neural foramina, greatly progressed from the 09/10/2020 chest CTA. 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 New lesion involving the left lamina of T10 with cortical disruption and extraosseous tumor extension posteriorly. No acute fracture. Paraspinal and other soft tissues: Aortic and coronary  atherosclerosis. Calcified right hilar lymph nodes. Disc levels: Diffuse thoracic disc degeneration. Mild-to-moderate spinal stenosis at T11-12 due to circumferential disc bulging, endplate  spurring, and severe right and moderate left facet arthrosis. CT LUMBAR SPINE FINDINGS Segmentation: 5 lumbar type vertebrae. Alignment: Moderate lumbar dextroscoliosis. Chronic grade 1 anterolisthesis of L4 on L5. Vertebrae: Numerous lytic lesions throughout the lumbar spine and included sacrum including a dominant 2.5 cm lesion in the L1 vertebral body, similar to the 11/27/2019 CT of the abdomen and pelvis. No acute fracture. Prior Ray cage fusion at L4-5. Paraspinal and other soft tissues: Bilateral renal atrophy. Abdominal aortic atherosclerosis. Disc levels: Advanced disc and facet degeneration from L2-3 to L5-S1. Prior laminectomies at L2-3, L3-4, and L4-5. Moderate spinal stenosis and severe left neural foraminal stenosis at L3-4 due to left eccentric disc bulging and posterior element hypertrophy. IMPRESSION: 1. Widespread lytic bone lesions consistent with multiple myeloma. 2. Large destructive lesion involving the left aspect of vertebral body and posterior elements of T1 and adjacent left first rib, greatly progressed from 09/10/2020. 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 3. No acute thoracic or lumbar spine fracture. 4. Advanced lumbar disc and facet degeneration with moderate spinal stenosis and severe left neural foraminal stenosis at L3-4. 5. Aortic Atherosclerosis (ICD10-I70.0). Electronically Signed   By: Logan Bores M.D.   On: 10/11/2021 17:20   CT Lumbar Spine Wo Contrast  Result Date: 10/11/2021 CLINICAL DATA:  Multiple falls. Increased back pain. History of multiple myeloma. EXAM: CT THORACIC AND LUMBAR SPINE WITHOUT CONTRAST TECHNIQUE: Multidetector CT imaging of the thoracic and lumbar spine was performed without contrast. Multiplanar CT image reconstructions were also generated. COMPARISON:  CTA chest 09/10/2020. CT abdomen and pelvis 11/27/2019. FINDINGS: CT THORACIC SPINE FINDINGS Alignment: Unchanged grade 1 anterolisthesis of C7 on T1.  Vertebrae: Numerous lytic lesions throughout the thoracic spine. Large destructive lesion involving the left aspect of the T1 vertebral body, left-sided T1 posterior elements, and adjacent left first rib with prominent extraosseous soft tissue component involving the left C7-T1 and T1-2 neural foramina, greatly progressed from the 09/10/2020 chest CTA. 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 New lesion involving the left lamina of T10 with cortical disruption and extraosseous tumor extension posteriorly. No acute fracture. Paraspinal and other soft tissues: Aortic and coronary atherosclerosis. Calcified right hilar lymph nodes. Disc levels: Diffuse thoracic disc degeneration. Mild-to-moderate spinal stenosis at T11-12 due to circumferential disc bulging, endplate spurring, and severe right and moderate left facet arthrosis. CT LUMBAR SPINE FINDINGS Segmentation: 5 lumbar type vertebrae. Alignment: Moderate lumbar dextroscoliosis. Chronic grade 1 anterolisthesis of L4 on L5. Vertebrae: Numerous lytic lesions throughout the lumbar spine and included sacrum including a dominant 2.5 cm lesion in the L1 vertebral body, similar to the 11/27/2019 CT of the abdomen and pelvis. No acute fracture. Prior Ray cage fusion at L4-5. Paraspinal and other soft tissues: Bilateral renal atrophy. Abdominal aortic atherosclerosis. Disc levels: Advanced disc and facet degeneration from L2-3 to L5-S1. Prior laminectomies at L2-3, L3-4, and L4-5. Moderate spinal stenosis and severe left neural foraminal stenosis at L3-4 due to left eccentric disc bulging and posterior element hypertrophy. IMPRESSION: 1. Widespread lytic bone lesions consistent with multiple myeloma. 2. Large destructive lesion involving the left aspect of vertebral body and posterior elements of T1 and adjacent left first rib, greatly progressed from 09/10/2020. Suspected left-sided ventral epidural tumor at T1, not well  evaluated by CT. 3. No acute thoracic  or lumbar spine fracture. 4. Advanced lumbar disc and facet degeneration with moderate spinal stenosis and severe left neural foraminal stenosis at L3-4. 5. Aortic Atherosclerosis (ICD10-I70.0). Electronically Signed   By: Logan Bores M.D.   On: 10/11/2021 17:20   DG Chest Port 1 View  Result Date: 10/11/2021 CLINICAL DATA:  Chest pain EXAM: PORTABLE CHEST 1 VIEW COMPARISON:  01/12/2021 FINDINGS: Transverse diameter of heart is increased. There are no signs of pulmonary edema or focal pulmonary consolidation. There is no pleural effusion or pneumothorax. Pacemaker battery is seen in the left infraclavicular region with tips of leads in right atrium and right ventricle. IMPRESSION: Cardiomegaly. There are no signs of pulmonary edema or focal pulmonary consolidation. Electronically Signed   By: Elmer Picker M.D.   On: 10/11/2021 13:09    Medications: I have reviewed the patient's current medications.  Assessment/Plan:  87 year old woman with:  1.  IgG kappa multiple myeloma diagnosed in 2020.  She achieved a good response to therapy with normalization of her IgG level although she continues to have elevated kappa to lambda ratio in October 2020.  She is currently off treatment.  Imaging studies obtained during this hospitalization showed widespread bony lesions consistent with worsening metastatic multiple myeloma.  No recent protein studies are available at this time.  I recommended updating her protein studies including serum protein electrophoresis, quantitative immunoglobulins and serum light chains.  Is likely she has progressed and might require different salvage therapy.  Her performance status is marginal with advanced age and multiple comorbidities.  I am not quite sure she will be able to handle additional salvage therapy.  Dr. Alvy Bimler will assess the patient tomorrow and discuss further treatment with her.  2.  T1 destructive lesion evident  on her CT scan.  Palliative radiation therapy could be considered for that particular lesion.  I recommend obtaining MRI of the thoracic spine for further determination.  3.  End-stage renal disease: She is currently on hemodialysis.  4.  Anemia: High factorial in nature related to plasma cell disorder and failure.  Her hemoglobin is adequate and does not require transfusion.  5.  Prognosis and goals of care: I agree with DNR CODE STATUS.  Not quite sure if she would be a candidate for any additional treatment and will defer the final decision to Dr. Alvy Bimler.  35  minutes were dedicated to this visit.  50% of the time was spent face-to-face and the time was spent on reviewing laboratory data, imaging studies, discussing treatment options, and answering questions regarding future plan.    LOS: 1 day   Zola Button 10/12/2021, 2:55 PM

## 2021-10-12 NOTE — Progress Notes (Signed)
OT Cancellation Note  Patient Details Name: [name] MRN: [id number] DOB: [birth year]   Cancelled Treatment:    Reason Eval/Treat Not Completed: Patient at procedure or test/ unavailable.  Will reattempt.  Nilsa Nutting., OTR/L Acute Rehabilitation Services Pager [phone] Office [phone]   Lucille Passy M 10/12/2021, 10:06 AM

## 2021-10-12 NOTE — TOC Transition Note (Signed)
Transition of Care [facility]) - CM/SW Discharge Note   Patient Details  Name: [name] MRN: [id number] Date of Birth: 1930-08-19  Transition of Care [facility]) CM/SW Contact:  Marilu Favre, RN Phone Number: 10/12/2021, 12:44 PM   Clinical Narrative:      Transition of Care (TOC) Screening Note   Patient Details  Name: [name] Date of Birth: Nov 17, 1929   Consent for home health and DME needs. Await PT/OT evaluations.    Transition of Care Department [facility]) has reviewed patient and no TOC needs have been identified at this time. We will continue to monitor patient advancement through interdisciplinary progression rounds. If new patient transition needs arise, please place a TOC consult.          Patient Goals and CMS Choice        Discharge Placement                       Discharge Plan and Services                                     Social Determinants of Health (SDOH) Interventions     Readmission Risk Interventions Readmission Risk Prevention Plan 04/04/2020 03/03/2019  Transportation Screening Complete Complete  PCP or Specialist Appt within 3-5 Days - Not Complete  Not Complete comments - not yet ready for d/c  HRI or [place] - Not Complete  HRI or Home Care Consult comments - no needs at this time  Social Work Consult for [place] Planning/Counseling - Not Complete  SW consult not completed comments - no needs at this time  Palliative Care Screening - Not Applicable  Medication Review ([place]) Complete Complete  PCP or Specialist appointment within 3-5 days of discharge (No Data) -  [place] or Home Care Consult Complete -  SW Recovery Care/Counseling Consult Complete -  Palliative Care Screening Not Applicable -  [place] Not Applicable -  Some recent data might be hidden

## 2021-10-12 NOTE — Progress Notes (Signed)
Pt receives out-pt HD at [facility] on TTS. Pt arrives at 9:25 for 9:45 chair time. Will assist as needed.   Melven Sartorius Renal Navigator [phone]

## 2021-10-12 NOTE — Evaluation (Signed)
Physical Therapy Evaluation °Patient Details °Name: [name] °MRN: [id number] °DOB: 10/22/1929 °Today's Date: 10/12/2021 ° °History of Present Illness ° Pt is a 87 y.o. F who presents 10/11/2021 after an assisted fall at dialysis. Imaging studies obtained during this hospitalization showed widespread bony lesions consistent with worsening metastatic multiple myeloma. Significant PMH: ESRD, atrial flutter, DM2, depression, multiple myeloma.  °Clinical Impression ° PTA, pt lives with her son and is a limited community ambulator using a walker. Pt presents with generalized weakness, decreased gross motor coordination, balance deficits, decreased endurance, and cognitive impairments. Pt son reports pt has had difficulty with memory recall recently. Pt requiring min assist for bed mobility and transfers out of bed. Pt deferring further mobility due to fatigue, back pain and nausea. Notified RN, who states pt received her medications one hour prior. Pt will benefit from follow up HHPT to address deficits.  °   ° °Recommendations for follow up therapy are one component of a multi-disciplinary discharge planning process, led by the attending physician.  Recommendations may be updated based on patient status, additional functional criteria and insurance authorization. ° °Follow Up Recommendations Home health PT ° °  °Assistance Recommended at Discharge Frequent or constant Supervision/Assistance  °Functional Status Assessment Patient has had a recent decline in their functional status and demonstrates the ability to make significant improvements in function in a reasonable and predictable amount of time.  °Equipment Recommendations ° None recommended by PT (pt equipped)  °  °Recommendations for Other Services    ° °  °Precautions / Restrictions Precautions °Precautions: Fall °Restrictions °Weight Bearing Restrictions: No  ° °  ° °Mobility ° Bed Mobility °Overal bed mobility: Needs Assistance °Bed Mobility: Supine to  Sit;Sit to Supine °  °  °Supine to sit: Min assist °Sit to supine: Min assist °  °General bed mobility comments: MinA for LE initiation °  ° °Transfers °Overall transfer level: Needs assistance °Equipment used: Rolling walker (2 wheels) °Transfers: Sit to/from Stand °Sit to Stand: Min assist °  °  °  °  °  °General transfer comment: MinA to rise to stand °  ° °Ambulation/Gait °  °  °  °  °  °  °  °  ° °Stairs °  °  °  °  °  ° °Wheelchair Mobility °  ° °Modified Rankin (Stroke Patients Only) °  ° °  ° °Balance Overall balance assessment: Needs assistance °Sitting-balance support: Feet supported °Sitting balance-Leahy Scale: Fair °  °  °Standing balance support: Bilateral upper extremity supported °Standing balance-Leahy Scale: Poor °Standing balance comment: reliant on RW °  °  °  °  °  °  °  °  °  °  °  °   ° ° ° °Pertinent Vitals/Pain Pain Assessment: Faces °Faces Pain Scale: Hurts even more °Pain Location: thoracic °Pain Descriptors / Indicators: Discomfort;Grimacing;Guarding °Pain Intervention(s): Limited activity within patient's tolerance;Monitored during session;Premedicated before session  ° ° °Home Living Family/patient expects to be discharged to:: Private residence °Living Arrangements: Spouse/significant other;Children (son) °Available Help at Discharge: Family °Type of Home: House °Home Access: Stairs to enter °  °Entrance Stairs-Number of Steps: 3 (1 step to landing, then 2 steps to kitchen) °  °Home Layout: Able to live on main level with bedroom/bathroom °Home Equipment: Rolling Walker (2 wheels);Cane - single point;Shower seat;BSC/3in1;Wheelchair - manual °   °  °Prior Function Prior Level of Function : Needs assist °  °  °  °  °  °  °  Mobility Comments: recently progressed from cane to walker ADLs Comments: assist for bathing     Hand Dominance        Extremity/Trunk Assessment   Upper Extremity Assessment Upper Extremity Assessment: RUE deficits/detail;LUE deficits/detail RUE Deficits /  Details: Grossly 4/5 RUE Coordination: decreased gross motor LUE Deficits / Details: Grossly 4/5 LUE Coordination: decreased gross motor    Lower Extremity Assessment Lower Extremity Assessment: RLE deficits/detail;LLE deficits/detail RLE Deficits / Details: Grossly 4/5 LLE Deficits / Details: Grossly 4/5    Cervical / Trunk Assessment Cervical / Trunk Assessment: Kyphotic  Communication   Communication: No difficulties  Cognition Arousal/Alertness: Awake/alert Behavior During Therapy: WFL for tasks assessed/performed Overall Cognitive Status: History of cognitive impairments - at baseline                                 General Comments: [name], although seems to be worsened currently as she thinks she is in the doctor's office, not [name]        General Comments      Exercises     Assessment/Plan    PT Assessment Patient needs continued PT services  PT Problem List Decreased strength;Decreased activity tolerance;Decreased balance;Decreased mobility;Decreased cognition;Decreased safety awareness;Pain       PT Treatment Interventions DME instruction;Gait training;Stair training;Functional mobility training;Therapeutic activities;Balance training;Therapeutic exercise;Patient/family education    PT Goals (Current goals can be found in the Care Plan section)  Acute Rehab PT Goals Patient Stated Goal: improved nausea PT Goal Formulation: With patient/family Time For Goal Achievement: 10/26/21 Potential to Achieve Goals: Fair    Frequency Min 3X/week   Barriers to discharge        Co-evaluation               AM-PAC PT "6 Clicks" Mobility  Outcome Measure Help needed turning from your back to your side while in a flat bed without using bedrails?: A Little Help needed moving from lying on your back to sitting on the side of a flat bed without using bedrails?: A Little Help needed moving to and from a bed to a  chair (including a wheelchair)?: A Little Help needed standing up from a chair using your arms (e.g., wheelchair or bedside chair)?: A Little Help needed to walk in hospital room?: A Lot Help needed climbing 3-5 steps with a railing? : A Lot 6 Click Score: 16    End of Session Equipment Utilized During Treatment: Gait belt Activity Tolerance: Patient limited by fatigue Patient left: in bed;with call bell/phone within reach;with bed alarm set;with family/visitor present Nurse Communication: Mobility status PT Visit Diagnosis: Unsteadiness on feet (R26.81);History of falling (Z91.81);Muscle weakness (generalized) (M62.81)    Time: 5093-2671 PT Time Calculation (min) (ACUTE ONLY): 31 min   Charges:   PT Evaluation $PT Eval Moderate Complexity: 1 Mod PT Treatments $Therapeutic Activity: 8-22 mins        Wyona Almas, PT, DPT Acute Rehabilitation Services Pager [phone] Office [phone]   Deno Etienne 10/12/2021, 4:09 PM

## 2021-10-12 NOTE — Telephone Encounter (Signed)
Called and given below message. Son verbalized understanding.

## 2021-10-12 NOTE — Hospital Course (Signed)
87 year old black female ESRD on HD TTS since 2020  DM TY 2 Multiple myeloma IgG type followed by Dr. 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

## 2021-10-12 NOTE — Progress Notes (Signed)
[facility] Progress Note  Subjective: seen in HD, in good spirits still very weak  Vitals:   10/12/21 0830 10/12/21 0900 10/12/21 0930 10/12/21 1000  BP: (!) 95/50 (!) 104/58 (!) 104/57 115/61  Pulse: 70 69 67 71  Resp:      Temp:      TempSrc:      SpO2:      Weight:      Height:        Exam: Gen alert, no distress No jvd or bruits Chest clear bilat to bases RRR no MRG Abd soft ntnd no mass or ascites +obesity Ext no LE edema Neuro is alert, Ox 3 , nf, gen weakness  RUA AVF+bruit    Home meds include - zyloprim, celexa, decadron, eliquis, amaryl, lactulose, lyrica, reglan, midodrine pre hd and prn, PPI, MVI, renvela 800 tid, prns/  vits/ supps    CXR 12/27 -  IMPRESSION: Cardiomegaly. There are no signs of pulmonary edema or focal pulmonary consolidation.     OP HD: [facility]  4h   350/500  59.9kg  2/2.5Ca bath  RUA AVF Hep 2000  - mircera 75 ug q4 wks, last 12/15       Assessment/ Plan: Generalized weakness - not sure cause, K+ ok, well dialyzed. Deconditioning prob contributing. Per pmd.  Multiple myeloma - had good response to initial Rx, but imaging here may be showing recurrence.  Oncology to see.  ESRD - on HD TTS. HD today off schedule, and HD tomorrow.  Hypotension/ vol - no vol excess on exam, 1-2kg up. BP's soft. Takes midodrine in OP setting pre HD. Minimal UF goal w/ HD today.  MBD ckd - cont binders, Ca in range Anemia ckd  - Hb 9.7, no esa needs for now HFrEF - hx of EF 25-30%. No edema on CXR, exam neg for edema.       Rob Doctor, hospital 10/12/2021, 11:50 AM   Recent Labs  Lab 10/11/21 1220 10/12/21 0500  K 4.5 4.8  BUN 87* 101*  CREATININE 7.86* 8.01*  CALCIUM 9.3 8.8*  HGB 9.7* 8.8*   Inpatient medications:  acyclovir  400 mg Oral Daily   allopurinol  50 mg Oral Daily   apixaban  2.5 mg Oral BID   Chlorhexidine Gluconate Cloth  6 each Topical Q0600   citalopram  10 mg Oral Daily   dexamethasone  1 mg Oral QHS    dexamethasone  2 mg Oral Daily   heparin  2,000 Units Dialysis Once in dialysis   lactulose  10 g Oral TID   metoCLOPramide  5 mg Oral TID AC   polyethylene glycol  17 g Oral Daily   pregabalin  50 mg Oral QHS   senna-docusate  2 tablet Oral BID   sevelamer carbonate  800 mg Oral TID WC    acetaminophen **OR** acetaminophen, calcium carbonate (dosed in mg elemental calcium), camphor-menthol **AND** hydrOXYzine, docusate sodium, feeding supplement (NEPRO CARB STEADY), midodrine, midodrine, ondansetron **OR** ondansetron (ZOFRAN) IV, oxyCODONE, sorbitol

## 2021-10-13 DIAGNOSIS — R262 Difficulty in walking, not elsewhere classified: Secondary | ICD-10-CM | POA: Diagnosis not present

## 2021-10-13 LAB — CBC WITH DIFFERENTIAL/PLATELET
Abs Immature Granulocytes: 0.04 10*3/uL (ref 0.00–0.07)
Basophils Absolute: 0 10*3/uL (ref 0.0–0.1)
Basophils Relative: 0 %
Eosinophils Absolute: 0 10*3/uL (ref 0.0–0.5)
Eosinophils Relative: 0 %
HCT: 29.5 % — ABNORMAL LOW (ref 36.0–46.0)
Hemoglobin: 9.5 g/dL — ABNORMAL LOW (ref 12.0–15.0)
Immature Granulocytes: 1 %
Lymphocytes Relative: 12 %
Lymphs Abs: 0.9 10*3/uL (ref 0.7–4.0)
MCH: 31.8 pg (ref 26.0–34.0)
MCHC: 32.2 g/dL (ref 30.0–36.0)
MCV: 98.7 fL (ref 80.0–100.0)
Monocytes Absolute: 0.6 10*3/uL (ref 0.1–1.0)
Monocytes Relative: 9 %
Neutro Abs: 5.9 10*3/uL (ref 1.7–7.7)
Neutrophils Relative %: 78 %
Platelets: 203 10*3/uL (ref 150–400)
RBC: 2.99 MIL/uL — ABNORMAL LOW (ref 3.87–5.11)
RDW: 17.2 % — ABNORMAL HIGH (ref 11.5–15.5)
WBC: 7.4 10*3/uL (ref 4.0–10.5)
nRBC: 0 % (ref 0.0–0.2)

## 2021-10-13 LAB — RENAL FUNCTION PANEL
Albumin: 3 g/dL — ABNORMAL LOW (ref 3.5–5.0)
Anion gap: 12 (ref 5–15)
BUN: 40 mg/dL — ABNORMAL HIGH (ref 8–23)
CO2: 24 mmol/L (ref 22–32)
Calcium: 8 mg/dL — ABNORMAL LOW (ref 8.9–10.3)
Chloride: 91 mmol/L — ABNORMAL LOW (ref 98–111)
Creatinine, Ser: 4.38 mg/dL — ABNORMAL HIGH (ref 0.44–1.00)
GFR, Estimated: 9 mL/min — ABNORMAL LOW (ref >60)
Glucose, Bld: 134 mg/dL — ABNORMAL HIGH (ref 70–99)
Phosphorus: 5 mg/dL — ABNORMAL HIGH (ref 2.5–4.6)
Potassium: 4.3 mmol/L (ref 3.5–5.1)
Sodium: 127 mmol/L — ABNORMAL LOW (ref 135–145)

## 2021-10-13 LAB — HEPATITIS B SURFACE ANTIBODY, QUANTITATIVE: Hep B S AB Quant (Post): <3.1 m[IU]/mL — ABNORMAL LOW (ref >9.9)

## 2021-10-13 MED ORDER — CYCLOBENZAPRINE HCL 5 MG PO TABS
5.0000 mg | ORAL_TABLET | Freq: Three times a day (TID) | ORAL | Status: DC | PRN
  Administered 2021-10-13: 13:00:00 5 mg via ORAL
  Filled 2021-10-13: qty 1

## 2021-10-13 MED ORDER — HEPARIN SODIUM (PORCINE) 1000 UNIT/ML DIALYSIS: 2000.0000 [IU] | Freq: Once | INTRAMUSCULAR | Status: DC

## 2021-10-13 NOTE — Progress Notes (Signed)
PROGRESS NOTE   [name]  IOE:[id number] DOB: 1930-06-21 DOA: 10/11/2021 PCP: Lucianne Lei, MD  Brief Narrative:  87 year old black female ESRD on HD TTS since 2020  DM TY 2 Multiple myeloma IgG type followed by Dr. 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  Tumor and L3-L4 stenosis Therapy recommends that patient can go home with home health eventually Because she is having upper back pain which seems primarily musculoskeletal we will add Flexeril Further management as per oncology Continue Decadron low-dose 2 mg daily for bony pain Continue Oxy IR 5 every 4 as needed carefully for severe pain Continue Lyrica 50 at bedtime ESRD TTS Requires midodrine 10 3 times daily Continue calcium carbonate 500 3 times daily, continue Renvela 800 3 times daily Atrial flutter CHA2DS2-VASc >4 Continue Eliquis 2.5 twice daily DM TY 2 Home med Amaryl 0.5 mg daily-hold for now, sliding scale ranging from 70-1 30 Depression Continue citalopram 10 daily Constipation Continue Reglan 5 3 times daily meals, lactulose 15 mils 3 times daily, MiraLAX 17 g daily and Senokot p.o.  DVT prophylaxis: Eliquis Code Status: Full Family Communication: None present currently Disposition:  Status is: Inpatient  Remains inpatient appropriate because: Cannot ambulate       Consultants:  Renal Oncology  Procedures:  Known  Antimicrobials:   No   Subjective:  doing fair no distress main pain is in her left upper extremity She states it is about 10 on 10 but then reframes it to be less after further clarification She has no chills rigors She is about to eat breakfast  Objective: Vitals:   10/12/21 1813 10/12/21 2309 10/13/21 0543 10/13/21 0828  BP: (!) 92/54 (!) 95/51 (!) 112/59 114/63  Pulse: 70 71 69 70  Resp: 17   18  Temp: 98.1 F (36.7 C) 98 F (36.7 C) 98.3 F (36.8 C) 97.9 F (36.6 C)  TempSrc:  Oral Oral Oral  SpO2: 99% 100% 98% 100%  Weight:      Height:        Intake/Output Summary (Last 24 hours) at 10/13/2021 0846 Last data filed at 10/13/2021 0545 Gross per 24 hour  Intake 520 ml  Output 2000 ml  Net -1480 ml    Filed Weights   10/11/21 1150  Weight: 61.3 kg    Examination:  Pleasant coherent no distress EOMI NCAT no focal deficit Chest clear no added sound rales rhonchi Abdomen soft nontender no rebound Leg raise not attempted Power 5/5   Data Reviewed: personally reviewed   CBC    Component Value Date/Time   WBC 7.4 10/13/2021 0211   RBC 2.99 (L) 10/13/2021 0211   HGB 9.5 (L) 10/13/2021 0211   HCT 29.5 (L) 10/13/2021 0211   PLT 203 10/13/2021 0211   MCV 98.7 10/13/2021 0211   MCH 31.8 10/13/2021 0211   MCHC 32.2 10/13/2021 0211   RDW 17.2 (H) 10/13/2021 0211   LYMPHSABS 0.9 10/13/2021 0211   MONOABS 0.6 10/13/2021 0211   EOSABS 0.0 10/13/2021 0211  BASOSABS 0.0 10/13/2021 0211   CMP Latest Ref Rng & Units 10/13/2021 10/12/2021 10/11/2021  Glucose 70 - 99 mg/dL 134(H) 70 96  BUN 8 - 23 mg/dL 40(H) 101(H) 87(H)  Creatinine 0.44 - 1.00 mg/dL 4.38(H) 8.01(H) 7.86(H)  Sodium 135 - 145 mmol/L 127(L) 129(L) 131(L)  Potassium 3.5 - 5.1 mmol/L 4.3 4.8 4.5  Chloride 98 - 111 mmol/L 91(L) 89(L) 89(L)  CO2 22 - 32 mmol/L _0 Calcium 8.9 - 10.3 mg/dL 8.0(L) 8.8(L) 9.3  Total Protein 6.5 - 8.1 g/dL - - 6.8  Total Bilirubin 0.3 - 1.2 mg/dL - - 0.6  Alkaline Phos 38 -  126 U/L - - 32(L)  AST 15 - 41 U/L - - 20  ALT 0 - 44 U/L - - 17     Radiology Studies: CT Thoracic Spine Wo Contrast  Result Date: 10/11/2021 CLINICAL DATA:  Multiple falls. Increased back pain. History of multiple myeloma. EXAM: CT THORACIC AND LUMBAR SPINE WITHOUT CONTRAST TECHNIQUE: Multidetector CT imaging of the thoracic and lumbar spine was performed without contrast. Multiplanar CT image reconstructions were also generated. COMPARISON:  CTA chest 09/10/2020. CT abdomen and pelvis 11/27/2019. FINDINGS: CT THORACIC SPINE FINDINGS Alignment: Unchanged grade 1 anterolisthesis of C7 on T1. Vertebrae: Numerous lytic lesions throughout the thoracic spine. Large destructive lesion involving the left aspect of the T1 vertebral body, left-sided T1 posterior elements, and adjacent left first rib with prominent extraosseous soft tissue component involving the left C7-T1 and T1-2 neural foramina, greatly progressed from the 09/10/2020 chest CTA. 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 New lesion involving the left lamina of T10 with cortical disruption and extraosseous tumor extension posteriorly. No acute fracture. Paraspinal and other soft tissues: Aortic and coronary atherosclerosis. Calcified right hilar lymph nodes. Disc levels: Diffuse thoracic disc degeneration. Mild-to-moderate spinal stenosis at T11-12 due to circumferential disc bulging, endplate spurring, and severe right and moderate left facet arthrosis. CT LUMBAR SPINE FINDINGS Segmentation: 5 lumbar type vertebrae. Alignment: Moderate lumbar dextroscoliosis. Chronic grade 1 anterolisthesis of L4 on L5. Vertebrae: Numerous lytic lesions throughout the lumbar spine and included sacrum including a dominant 2.5 cm lesion in the L1 vertebral body, similar to the 11/27/2019 CT of the abdomen and pelvis. No acute fracture. Prior Ray cage fusion at L4-5. Paraspinal and other soft tissues: Bilateral renal  atrophy. Abdominal aortic atherosclerosis. Disc levels: Advanced disc and facet degeneration from L2-3 to L5-S1. Prior laminectomies at L2-3, L3-4, and L4-5. Moderate spinal stenosis and severe left neural foraminal stenosis at L3-4 due to left eccentric disc bulging and posterior element hypertrophy. IMPRESSION: 1. Widespread lytic bone lesions consistent with multiple myeloma. 2. Large destructive lesion involving the left aspect of vertebral body and posterior elements of T1 and adjacent left first rib, greatly progressed from 09/10/2020. 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 3. No acute thoracic or lumbar spine fracture. 4. Advanced lumbar disc and facet degeneration with moderate spinal stenosis and severe left neural foraminal stenosis at L3-4. 5. Aortic Atherosclerosis (ICD10-I70.0). Electronically Signed   By: Logan Bores M.D.   On: 10/11/2021 17:20   CT Lumbar Spine Wo Contrast  Result Date: 10/11/2021 CLINICAL DATA:  Multiple falls. Increased back pain. History of multiple myeloma. EXAM: CT THORACIC AND LUMBAR SPINE WITHOUT CONTRAST TECHNIQUE: Multidetector CT imaging of the thoracic and lumbar spine was performed without contrast. Multiplanar CT image reconstructions were also generated. COMPARISON:  CTA chest 09/10/2020.  CT abdomen and pelvis 11/27/2019. FINDINGS: CT THORACIC SPINE FINDINGS Alignment: Unchanged grade 1 anterolisthesis of C7 on T1. Vertebrae: Numerous lytic lesions throughout the thoracic spine. Large destructive lesion involving the left aspect of the T1 vertebral body, left-sided T1 posterior elements, and adjacent left first rib with prominent extraosseous soft tissue component involving the left C7-T1 and T1-2 neural foramina, greatly progressed from the 09/10/2020 chest CTA. 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 New lesion involving the left lamina of T10 with cortical disruption and  extraosseous tumor extension posteriorly. No acute fracture. Paraspinal and other soft tissues: Aortic and coronary atherosclerosis. Calcified right hilar lymph nodes. Disc levels: Diffuse thoracic disc degeneration. Mild-to-moderate spinal stenosis at T11-12 due to circumferential disc bulging, endplate spurring, and severe right and moderate left facet arthrosis. CT LUMBAR SPINE FINDINGS Segmentation: 5 lumbar type vertebrae. Alignment: Moderate lumbar dextroscoliosis. Chronic grade 1 anterolisthesis of L4 on L5. Vertebrae: Numerous lytic lesions throughout the lumbar spine and included sacrum including a dominant 2.5 cm lesion in the L1 vertebral body, similar to the 11/27/2019 CT of the abdomen and pelvis. No acute fracture. Prior Ray cage fusion at L4-5. Paraspinal and other soft tissues: Bilateral renal atrophy. Abdominal aortic atherosclerosis. Disc levels: Advanced disc and facet degeneration from L2-3 to L5-S1. Prior laminectomies at L2-3, L3-4, and L4-5. Moderate spinal stenosis and severe left neural foraminal stenosis at L3-4 due to left eccentric disc bulging and posterior element hypertrophy. IMPRESSION: 1. Widespread lytic bone lesions consistent with multiple myeloma. 2. Large destructive lesion involving the left aspect of vertebral body and posterior elements of T1 and adjacent left first rib, greatly progressed from 09/10/2020. 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 3. No acute thoracic or lumbar spine fracture. 4. Advanced lumbar disc and facet degeneration with moderate spinal stenosis and severe left neural foraminal stenosis at L3-4. 5. Aortic Atherosclerosis (ICD10-I70.0). Electronically Signed   By: Logan Bores M.D.   On: 10/11/2021 17:20   DG Chest Port 1 View  Result Date: 10/11/2021 CLINICAL DATA:  Chest pain EXAM: PORTABLE CHEST 1 VIEW COMPARISON:  01/12/2021 FINDINGS: Transverse diameter of heart is increased. There are no signs of pulmonary  edema or focal pulmonary consolidation. There is no pleural effusion or pneumothorax. Pacemaker battery is seen in the left infraclavicular region with tips of leads in right atrium and right ventricle. IMPRESSION: Cardiomegaly. There are no signs of pulmonary edema or focal pulmonary consolidation. Electronically Signed   By: Elmer Picker M.D.   On: 10/11/2021 13:09     Scheduled Meds:  acyclovir  400 mg Oral Daily   allopurinol  50 mg Oral Daily   apixaban  2.5 mg Oral BID   Chlorhexidine Gluconate Cloth  6 each Topical Q0600   citalopram  10 mg Oral Daily   dexamethasone  1 mg Oral QHS   dexamethasone  2 mg Oral Daily   lactulose  10 g Oral TID   metoCLOPramide  5 mg Oral TID AC   polyethylene glycol  17 g Oral Daily   pregabalin  50 mg Oral QHS   senna-docusate  2 tablet Oral BID   sevelamer carbonate  800 mg Oral TID WC   Continuous Infusions:   LOS: 2 days   Time spent: [place], MD [facility] To contact the attending provider between 7A-7P or the covering provider during after hours 7P-7A, please log into the web site [email or web address] and  access using universal  password for that web site. If you do not have the password, please call the hospital operator.  10/13/2021, 8:46 AM

## 2021-10-13 NOTE — Progress Notes (Signed)
Physical Therapy Treatment Patient Details Name: [name] MRN: [id number] DOB: 1930/02/27 Today's Date: 10/13/2021   History of Present Illness 87 y.o. F who presents 10/11/2021 after an assisted fall at dialysis. Imaging studies obtained during this hospitalization showed widespread bony lesions consistent with worsening metastatic multiple myeloma. Significant PMH: ESRD, atrial flutter, DM2, depression, multiple myeloma.    PT Comments    Patient able to progress OOB mobility this date. Patient ambulated 69' + 10' with RW and minA. Patient with posterior LOB x 3 during short ambulation distance. Patient with complaints of increased back pain with mobility. Continue to recommend HHPT at discharge to maximize functional independence and safety.     Recommendations for follow up therapy are one component of a multi-disciplinary discharge planning process, led by the attending physician.  Recommendations may be updated based on patient status, additional functional criteria and insurance authorization.  Follow Up Recommendations  Home health PT     Assistance Recommended at Discharge Frequent or constant Supervision/Assistance  Equipment Recommendations  None recommended by PT (patient owns necessary DME)    Recommendations for Other Services       Precautions / Restrictions Precautions Precautions: Fall Restrictions Weight Bearing Restrictions: No     Mobility  Bed Mobility Overal bed mobility: Needs Assistance Bed Mobility: Supine to Sit     Supine to sit: Min guard;HOB elevated     General bed mobility comments: no assist required to get to EOB    Transfers Overall transfer level: Needs assistance Equipment used: Rolling Xayden Linsey (2 wheels) Transfers: Sit to/from Stand Sit to Stand: Min assist           General transfer comment: minA to rise and steady from EOB and commode    Ambulation/Gait Ambulation/Gait assistance: Min assist Gait Distance  (Feet): 20 Feet (x10') Assistive device: Rolling Para Cossey (2 wheels) Gait Pattern/deviations: Step-to pattern;Decreased stride length;Trunk flexed;Wide base of support Gait velocity: decreased     General Gait Details: minA for balance during mobility. Patient with posterior LOB x 3 during short ambulation distance.   Stairs             Wheelchair Mobility    Modified Rankin (Stroke Patients Only)       Balance Overall balance assessment: Needs assistance Sitting-balance support: Feet supported Sitting balance-Leahy Scale: Fair     Standing balance support: Bilateral upper extremity supported Standing balance-Leahy Scale: Poor Standing balance comment: reliant on RW                            Cognition Arousal/Alertness: Awake/alert Behavior During Therapy: WFL for tasks assessed/performed Overall Cognitive Status: History of cognitive impairments - at baseline                                 General Comments: STM deficits noted        Exercises      General Comments        Pertinent Vitals/Pain Pain Assessment: Faces Faces Pain Scale: Hurts even more Pain Location: back Pain Descriptors / Indicators: Discomfort;Grimacing;Guarding Pain Intervention(s): Monitored during session;Repositioned    Home Living                          Prior Function            PT Goals (current goals can now be  found in the care plan section) Acute Rehab PT Goals Patient Stated Goal: improved mobility PT Goal Formulation: With patient/family Time For Goal Achievement: 10/26/21 Potential to Achieve Goals: Fair Progress towards PT goals: Progressing toward goals    Frequency    Min 3X/week      PT Plan Current plan remains appropriate    Co-evaluation              AM-PAC PT "6 Clicks" Mobility   Outcome Measure  Help needed turning from your back to your side while in a flat bed without using bedrails?: A  Little Help needed moving from lying on your back to sitting on the side of a flat bed without using bedrails?: A Little Help needed moving to and from a bed to a chair (including a wheelchair)?: A Little Help needed standing up from a chair using your arms (e.g., wheelchair or bedside chair)?: A Little Help needed to walk in hospital room?: A Little Help needed climbing 3-5 steps with a railing? : A Lot 6 Click Score: 17    End of Session Equipment Utilized During Treatment: Gait belt Activity Tolerance: Patient limited by fatigue Patient left: in chair;with call bell/phone within reach;with chair alarm set Nurse Communication: Mobility status PT Visit Diagnosis: Unsteadiness on feet (R26.81);History of falling (Z91.81);Muscle weakness (generalized) (M62.81)     Time: 7919-9579 PT Time Calculation (min) (ACUTE ONLY): 24 min  Charges:  $Gait Training: 23-37 mins                     Osborne Serio A. Gilford Rile PT, DPT Acute Rehabilitation Services Pager [phone] Office [phone]    Linna Hoff 10/13/2021, 10:31 AM

## 2021-10-13 NOTE — Evaluation (Signed)
Occupational Therapy Evaluation Patient Details Name: [name] MRN: [id number] DOB: 1929/12/12 Today's Date: 10/13/2021   History of Present Illness 87 y.o. F who presents 10/11/2021 after an assisted fall at dialysis. Imaging studies obtained during this hospitalization showed widespread bony lesions consistent with worsening metastatic multiple myeloma. Significant PMH: ESRD, atrial flutter, DM2, depression, multiple myeloma.   Clinical Impression   At baseline, pt requires min assist for bathing and uses RW for functional mobility. Pt currently requiring set up A-min A for ADLs, min guard for bed mobility, and min guard for transfers. Pt is close to baseline but feels weaker and more fatigued with transfers and ADL tasks. Pt presenting with impairments listed below, will continue to follow acutely. Recommend d/c home with assistance and HHOT.      Recommendations for follow up therapy are one component of a multi-disciplinary discharge planning process, led by the attending physician.  Recommendations may be updated based on patient status, additional functional criteria and insurance authorization.   Follow Up Recommendations  Home health OT    Assistance Recommended at Discharge Intermittent Supervision/Assistance  Functional Status Assessment  Patient has had a recent decline in their functional status and demonstrates the ability to make significant improvements in function in a reasonable and predictable amount of time.  Equipment Recommendations  None recommended by OT;Other (comment) (pt has all needed DME)    Recommendations for Other Services PT consult     Precautions / Restrictions Precautions Precautions: Fall Restrictions Weight Bearing Restrictions: No      Mobility Bed Mobility Overal bed mobility: Needs Assistance Bed Mobility: Supine to Sit       Sit to supine: Min guard        Transfers Overall transfer level: Needs assistance Equipment  used: Rolling walker (2 wheels) Transfers: Sit to/from Stand Sit to Stand: Min assist           General transfer comment: pt benefits from elevated surface, difficulty rising from commode      Balance Overall balance assessment: Needs assistance Sitting-balance support: Feet supported Sitting balance-Leahy Scale: Fair     Standing balance support: Bilateral upper extremity supported Standing balance-Leahy Scale: Poor Standing balance comment: reliant on RW                           ADL either performed or assessed with clinical judgement   ADL Overall ADL's : Needs assistance/impaired Eating/Feeding: Set up;Sitting   Grooming: Set up;Sitting;Wash/dry face;Wash/dry hands Grooming Details (indicate cue type and reason): washed face and hands sitting at sink Upper Body Bathing: Minimal assistance;Sitting Upper Body Bathing Details (indicate cue type and reason): min A to wash back Lower Body Bathing: Minimal assistance;Sit to/from stand Lower Body Bathing Details (indicate cue type and reason): able to complete LB bathing while holding onto [facility] rail for support Upper Body Dressing : Supervision/safety;Sitting Upper Body Dressing Details (indicate cue type and reason): dons new gown sitting up in chair Lower Body Dressing: Supervision/safety;Sitting/lateral leans Lower Body Dressing Details (indicate cue type and reason): simulated donning socks sitting up in chair Toilet Transfer: Min guard;Ambulation;Regular Toilet;Rolling walker (2 wheels)   Toileting- Clothing Manipulation and Hygiene: Minimal assistance;Sitting/lateral lean Toileting - Clothing Manipulation Details (indicate cue type and reason): min A to move gown prior to sitting on commode     Functional mobility during ADLs: Min guard;Rolling walker (2 wheels)       Vision Baseline Vision/History: 1 Wears glasses Vision Assessment?:  No apparent visual deficits     Perception     Praxis       Pertinent Vitals/Pain Pain Assessment: Faces Pain Score: 6  Faces Pain Scale: Hurts even more Pain Location: back - underneath shoulderblades Pain Descriptors / Indicators: Aching;Discomfort;Constant Pain Intervention(s): Limited activity within patient's tolerance;Monitored during session;Repositioned     Hand Dominance     Extremity/Trunk Assessment Upper Extremity Assessment Upper Extremity Assessment: Overall WFL for tasks assessed   Lower Extremity Assessment Lower Extremity Assessment: Defer to PT evaluation   Cervical / Trunk Assessment Cervical / Trunk Assessment: Kyphotic   Communication Communication Communication: No difficulties   Cognition Arousal/Alertness: Awake/alert Behavior During Therapy: WFL for tasks assessed/performed Overall Cognitive Status: History of cognitive impairments - at baseline                                       General Comments  pt reported continued back pain during session, RN notified    Exercises     Shoulder Instructions      Home Living Family/patient expects to be discharged to:: Private residence Living Arrangements: Spouse/significant other;Children Available Help at Discharge: Family Type of Home: House Home Access: Stairs to enter Technical brewer of Steps: 3 Entrance Stairs-Rails: None Home Layout: Two level;Able to live on main level with bedroom/bathroom     Bathroom Shower/Tub: Occupational psychologist: Handicapped height Bathroom Accessibility: Yes How Accessible: Accessible via walker Home Equipment: [place] (2 wheels);Cane - single point;Shower seat;BSC/3in1;Wheelchair - manual          Prior Functioning/Environment Prior Level of Function : Needs assist             Mobility Comments: recently progressed from cane to walker ADLs Comments: requires assitance for bathing (back and legs specifically)        OT Problem List: Decreased strength;Decreased  range of motion;Decreased activity tolerance;Impaired balance (sitting and/or standing);Decreased knowledge of use of DME or AE      OT Treatment/Interventions: Self-care/ADL training;Therapeutic exercise;DME and/or AE instruction;Therapeutic activities;Patient/family education;Balance training    OT Goals(Current goals can be found in the care plan section) Acute Rehab OT Goals Patient Stated Goal: none stated OT Goal Formulation: With patient Time For Goal Achievement: 10/27/21 Potential to Achieve Goals: Good ADL Goals Pt Will Perform Lower Body Dressing: with modified independence;sit to/from stand Pt Will Transfer to Toilet: with modified independence;ambulating;regular height toilet Pt Will Perform Tub/Shower Transfer: with modified independence;shower seat;ambulating;rolling walker  OT Frequency: Min 2X/week   Barriers to D/C:            Co-evaluation              AM-PAC OT "6 Clicks" Daily Activity     Outcome Measure Help from another person eating meals?: None Help from another person taking care of personal grooming?: None Help from another person toileting, which includes using toliet, bedpan, or urinal?: A Little Help from another person bathing (including washing, rinsing, drying)?: A Little Help from another person to put on and taking off regular upper body clothing?: A Little Help from another person to put on and taking off regular lower body clothing?: A Little 6 Click Score: 20   End of Session Equipment Utilized During Treatment: Gait belt;Rolling walker (2 wheels) Nurse Communication: Mobility status;Patient requests pain meds  Activity Tolerance: Patient tolerated treatment well Patient left: in bed;with call bell/phone within  reach;with bed alarm set;with family/visitor present  OT Visit Diagnosis: Unsteadiness on feet (R26.81);Other abnormalities of gait and mobility (R26.89);Repeated falls (R29.6);Muscle weakness (generalized) (M62.81);History of  falling (Z91.81)                Time: 6384-6659 OT Time Calculation (min): 24 min Charges:  OT General Charges $OT Visit: 1 Visit OT Evaluation $OT Eval Low Complexity: 1 Low OT Treatments $Self Care/Home Management : 8-22 mins  Lynnda Child, OTD, OTR/L Acute Rehab [phone]) [phone] - [place] 10/13/2021, 2:56 PM

## 2021-10-13 NOTE — Progress Notes (Signed)
Mobility Specialist Progress Note:   10/13/21 1300  Mobility  Activity Ambulated in hall  Level of Assistance Contact guard assist, steadying assist  Assistive Device Front wheel walker  Distance Ambulated (ft) 80 ft  Mobility Ambulated with assistance in hallway  Mobility Response Tolerated well  Mobility performed by Mobility specialist  Bed Position Chair  $Mobility charge 1 Mobility   Pt eager for mobility, sitting up in chair. No physical assist required to stand from chair, min gueard for safety throughout session. Pt c/o pain near shoulder blade, otherwise asx during ambulation. Left pt up in chair eating lunch.  Nelta Numbers Mobility Specialist  Phone [phone]

## 2021-10-13 NOTE — Progress Notes (Addendum)
[facility] Progress Note   Subjective:    Seen and examined at bedside. Denies SOB, CP, and N/V. Plan for HD today.  Objective Vitals:   10/12/21 1813 10/12/21 2309 10/13/21 0543 10/13/21 0828  BP: (!) 92/54 (!) 95/51 (!) 112/59 114/63  Pulse: 70 71 69 70  Resp: 17   18  Temp: 98.1 F (36.7 C) 98 F (36.7 C) 98.3 F (36.8 C) 97.9 F (36.6 C)  TempSrc:  Oral Oral Oral  SpO2: 99% 100% 98% 100%  Weight:      Height:       Physical Exam General: Elderly woman; frail; NAD Heart: S1 and S2; No murmurs, gallops, or rubs Lungs: Clear throughout; No wheezing, rales, or rhonchi Abdomen: Soft and non-tender Extremities: No edema BLLE Dialysis Access: R AVF (+) Bruit/Thrill   Filed Weights   10/11/21 1150  Weight: 61.3 kg    Intake/Output Summary (Last 24 hours) at 10/13/2021 1350 Last data filed at 10/13/2021 1030 Gross per 24 hour  Intake 760 ml  Output 0 ml  Net 760 ml    Additional Objective Labs: Basic Metabolic Panel: Recent Labs  Lab 10/11/21 1220 10/12/21 0500 10/13/21 0211  NA 131* 129* 127*  K 4.5 4.8 4.3  CL 89* 89* 91*  CO2 26 22 24   GLUCOSE 96 70 134*  BUN 87* 101* 40*  CREATININE 7.86* 8.01* 4.38*  CALCIUM 9.3 8.8* 8.0*  PHOS  --   --  5.0*   Liver Function Tests: Recent Labs  Lab 10/11/21 1220 10/13/21 0211  AST 20  --   ALT 17  --   ALKPHOS 32*  --   BILITOT 0.6  --   PROT 6.8  --   ALBUMIN 3.2* 3.0*   Recent Labs  Lab 10/11/21 1220  LIPASE 23   CBC: Recent Labs  Lab 10/11/21 1220 10/12/21 0500 10/13/21 0211  WBC 9.3 8.0 7.4  NEUTROABS 8.0*  --  5.9  HGB 9.7* 8.8* 9.5*  HCT 30.7* 28.0* 29.5*  MCV 102.0* 101.1* 98.7  PLT 208 188 203   Blood Culture    Component Value Date/Time   SDES URINE, CLEAN CATCH 11/27/2019 1525   SPECREQUEST NONE 11/27/2019 1525   CULT  11/27/2019 1525    NO GROWTH Performed at [facility], [place] [address] [place], [place] [address]    REPTSTATUS 11/28/2019 FINAL  11/27/2019 1525    Cardiac Enzymes: No results for input(s): CKTOTAL, CKMB, CKMBINDEX, TROPONINI in the last 168 hours. CBG: No results for input(s): GLUCAP in the last 168 hours. Iron Studies: No results for input(s): IRON, TIBC, TRANSFERRIN, FERRITIN in the last 72 hours. Lab Results  Component Value Date   INR 1.1 10/11/2021   INR 1.0 04/02/2020   INR 1.57 (H) 01/03/2010   Studies/Results: CT Thoracic Spine Wo Contrast  Result Date: 10/11/2021 CLINICAL DATA:  Multiple falls. Increased back pain. History of multiple myeloma. EXAM: CT THORACIC AND LUMBAR SPINE WITHOUT CONTRAST TECHNIQUE: Multidetector CT imaging of the thoracic and lumbar spine was performed without contrast. Multiplanar CT image reconstructions were also generated. COMPARISON:  CTA chest 09/10/2020. CT abdomen and pelvis 11/27/2019. FINDINGS: CT THORACIC SPINE FINDINGS Alignment: Unchanged grade 1 anterolisthesis of C7 on T1. Vertebrae: Numerous lytic lesions throughout the thoracic spine. Large destructive lesion involving the left aspect of the T1 vertebral body, left-sided T1 posterior elements, and adjacent left first rib with prominent extraosseous soft tissue component involving the left C7-T1 and T1-2 neural foramina,  greatly progressed from the 09/10/2020 chest CTA. 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 New lesion involving the left lamina of T10 with cortical disruption and extraosseous tumor extension posteriorly. No acute fracture. Paraspinal and other soft tissues: Aortic and coronary atherosclerosis. Calcified right hilar lymph nodes. Disc levels: Diffuse thoracic disc degeneration. Mild-to-moderate spinal stenosis at T11-12 due to circumferential disc bulging, endplate spurring, and severe right and moderate left facet arthrosis. CT LUMBAR SPINE FINDINGS Segmentation: 5 lumbar type vertebrae. Alignment: Moderate lumbar dextroscoliosis. Chronic grade 1  anterolisthesis of L4 on L5. Vertebrae: Numerous lytic lesions throughout the lumbar spine and included sacrum including a dominant 2.5 cm lesion in the L1 vertebral body, similar to the 11/27/2019 CT of the abdomen and pelvis. No acute fracture. Prior Ray cage fusion at L4-5. Paraspinal and other soft tissues: Bilateral renal atrophy. Abdominal aortic atherosclerosis. Disc levels: Advanced disc and facet degeneration from L2-3 to L5-S1. Prior laminectomies at L2-3, L3-4, and L4-5. Moderate spinal stenosis and severe left neural foraminal stenosis at L3-4 due to left eccentric disc bulging and posterior element hypertrophy. IMPRESSION: 1. Widespread lytic bone lesions consistent with multiple myeloma. 2. Large destructive lesion involving the left aspect of vertebral body and posterior elements of T1 and adjacent left first rib, greatly progressed from 09/10/2020. 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 3. No acute thoracic or lumbar spine fracture. 4. Advanced lumbar disc and facet degeneration with moderate spinal stenosis and severe left neural foraminal stenosis at L3-4. 5. Aortic Atherosclerosis (ICD10-I70.0). Electronically Signed   By: Logan Bores M.D.   On: 10/11/2021 17:20   CT Lumbar Spine Wo Contrast  Result Date: 10/11/2021 CLINICAL DATA:  Multiple falls. Increased back pain. History of multiple myeloma. EXAM: CT THORACIC AND LUMBAR SPINE WITHOUT CONTRAST TECHNIQUE: Multidetector CT imaging of the thoracic and lumbar spine was performed without contrast. Multiplanar CT image reconstructions were also generated. COMPARISON:  CTA chest 09/10/2020. CT abdomen and pelvis 11/27/2019. FINDINGS: CT THORACIC SPINE FINDINGS Alignment: Unchanged grade 1 anterolisthesis of C7 on T1. Vertebrae: Numerous lytic lesions throughout the thoracic spine. Large destructive lesion involving the left aspect of the T1 vertebral body, left-sided T1 posterior elements, and adjacent left  first rib with prominent extraosseous soft tissue component involving the left C7-T1 and T1-2 neural foramina, greatly progressed from the 09/10/2020 chest CTA. 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 New lesion involving the left lamina of T10 with cortical disruption and extraosseous tumor extension posteriorly. No acute fracture. Paraspinal and other soft tissues: Aortic and coronary atherosclerosis. Calcified right hilar lymph nodes. Disc levels: Diffuse thoracic disc degeneration. Mild-to-moderate spinal stenosis at T11-12 due to circumferential disc bulging, endplate spurring, and severe right and moderate left facet arthrosis. CT LUMBAR SPINE FINDINGS Segmentation: 5 lumbar type vertebrae. Alignment: Moderate lumbar dextroscoliosis. Chronic grade 1 anterolisthesis of L4 on L5. Vertebrae: Numerous lytic lesions throughout the lumbar spine and included sacrum including a dominant 2.5 cm lesion in the L1 vertebral body, similar to the 11/27/2019 CT of the abdomen and pelvis. No acute fracture. Prior Ray cage fusion at L4-5. Paraspinal and other soft tissues: Bilateral renal atrophy. Abdominal aortic atherosclerosis. Disc levels: Advanced disc and facet degeneration from L2-3 to L5-S1. Prior laminectomies at L2-3, L3-4, and L4-5. Moderate spinal stenosis and severe left neural foraminal stenosis at L3-4 due to left eccentric disc bulging and posterior element hypertrophy. IMPRESSION: 1. Widespread lytic bone  lesions consistent with multiple myeloma. 2. Large destructive lesion involving the left aspect of vertebral body and posterior elements of T1 and adjacent left first rib, greatly progressed from 09/10/2020. 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 3. No acute thoracic or lumbar spine fracture. 4. Advanced lumbar disc and facet degeneration with moderate spinal stenosis and severe left neural foraminal stenosis at L3-4. 5.  Aortic Atherosclerosis (ICD10-I70.0). Electronically Signed   By: Logan Bores M.D.   On: 10/11/2021 17:20    Medications:   acyclovir  400 mg Oral Daily   allopurinol  50 mg Oral Daily   apixaban  2.5 mg Oral BID   Chlorhexidine Gluconate Cloth  6 each Topical Q0600   citalopram  10 mg Oral Daily   dexamethasone  1 mg Oral QHS   dexamethasone  2 mg Oral Daily   lactulose  10 g Oral TID   metoCLOPramide  5 mg Oral TID AC   polyethylene glycol  17 g Oral Daily   pregabalin  50 mg Oral QHS   senna-docusate  2 tablet Oral BID   sevelamer carbonate  800 mg Oral TID WC    Dialysis Orders: [facility] T[facility] 4h   350/500  59.9kg  2/2.5Ca bath  RUA AVF Hep 2000 -micera 75 ug q4 wks, last 12/15   Assessment/Plan: Generalized weakness - K+ ok, well dialyzed. Debility likely related to progressive metastatic myeloma. Seen by ONC. Per pmd.  Multiple myeloma - had good response to initial Rx, but imaging here now is showing multiple spinal lesions suggesting recurrence/ worsening. Per ONC not sure if she will be candidate for additional treatment due to debility/ marginal performance status.  ESRD - on HD TTS. HD yesterday and today to place patient back on usual schedule. UF as tolerated. Hypotension/ vol - no vol excess on exam, 1-2kg up. BP's soft. Takes midodrine in OP setting pre HD. Minimal UF goal w/ HD today.  MBD ckd - cont binders, Ca in range Anemia ckd  - Hb 9.7, no esa needs for now HFrEF - hx of EF 25-30%. No edema on CXR, exam neg for edema  [name], NP [facility] 10/13/2021,1:50 PM  LOS: 2 days    Pt seen, examined and agree w assess/plan as above with additions as indicated.  [facility] 10/13/2021, 4:28 PM

## 2021-10-13 NOTE — Progress Notes (Signed)
[name]   DOB:Jun 12, 1930   LK#:[id number]    ASSESSMENT & PLAN:  Multiple myeloma on supportive care Her last set of myeloma panel drawn on October 10 showed disease stability Over the last 2 months, we have attempted to discontinue or taper off dexamethasone I suspect recent dexamethasone taper might have unmasked the disease noted on CT imaging Due to presence of pacemaker, it is not possible to order MRI imaging Myeloma panel has been repeated, pending and the patient/son is aware it could take at least a week for results to become available She has been comfortable since admission She has appointment to see me in the outpatient clinic on January 9 for follow-up I suggest we keep that appointment as scheduled and discharge her over the next 24 to 48 hours if she is comfortable Over the past 2 years, the patient is aware that all the treatment prescribed so far has been palliative in nature I do not feel a sense of urgency to recommend aggressive work-up, additional imaging study or consultation with radiation oncologist for further management of the abnormalities seen on imaging study This has been fully discussed with her son and he is in agreement with the plan of care  End-stage renal disease Chronic anemia Chronic cardiomyopathy/congestive heart failure Continue medical management  Code Status DNR  Goals of care Reasonable pain control  Discharge planning Hopefully within the next 24 to 48 hours  All questions were answered. The patient knows to call the clinic with any problems, questions or concerns.   The total time spent in the appointment was 40 minutes encounter with patients including review of chart and various tests results, discussions about plan of care and coordination of care plan  Heath Lark, MD 10/13/2021 6:06 PM  Subjective:  The patient is well-known to me She had advanced stage multiple myeloma, presented 2 years ago with IgG kappa disease  causing end-stage renal failure leading to chronic hemodialysis dependency.  She also has multiple other comorbidities including congestive heart failure, diabetes and others.  The patient has responded very well to palliative chemotherapy.  She is currently on treatment break and supportive management with low-dose dexamethasone over the past few months.  I spoke with the patient and collaborated the history with her son.  Over the past 2 months, she has slight progressive discomfort over the left scapula.  Her son noted some mild progressive weakness but the patient has been quite sedentary even prior to this admission. She was seen while undergoing hemodialysis and appears very comfortable  Objective:  Vitals:   10/13/21 1700 10/13/21 1730  BP: 92/68 (!) 99/56  Pulse: 86 74  Resp: (!) 29 12  Temp:    SpO2:       Intake/Output Summary (Last 24 hours) at 10/13/2021 1806 Last data filed at 10/13/2021 1030 Gross per 24 hour  Intake 760 ml  Output 0 ml  Net 760 ml    GENERAL:alert, no distress and comfortable NEURO: alert & oriented x 3 with fluent speech, no focal motor/sensory deficits   Labs:  Recent Labs    04/25/21 1320 07/25/21 1155 10/11/21 1220 10/12/21 0500 10/13/21 0211  NA 135 134* 131* 129* 127*  K 4.9 4.3 4.5 4.8 4.3  CL 95* 97* 89* 89* 91*  CO2 _0 GLUCOSE 83 119* 96 70 134*  BUN 87* 63* 87* 101* 40*  CREATININE 4.63* 4.48* 7.86* 8.01* 4.38*  CALCIUM 9.0 8.8* 9.3 8.8* 8.0*  GFRNONAA  9* 9* 5* 4* 9*  PROT 6.5 6.3* 6.8  --   --   ALBUMIN 3.8 3.6 3.2*  --  3.0*  AST _0 --   --   ALT _1 --   --   ALKPHOS 39 42 32*  --   --   BILITOT 0.4 0.4 0.6  --   --     Studies: I have personally reviewed her CT imaging CT Thoracic Spine Wo Contrast  Result Date: 10/11/2021 CLINICAL DATA:  Multiple falls. Increased back pain. History of multiple myeloma. EXAM: CT THORACIC AND LUMBAR SPINE WITHOUT CONTRAST TECHNIQUE: Multidetector CT imaging of  the thoracic and lumbar spine was performed without contrast. Multiplanar CT image reconstructions were also generated. COMPARISON:  CTA chest 09/10/2020. CT abdomen and pelvis 11/27/2019. FINDINGS: CT THORACIC SPINE FINDINGS Alignment: Unchanged grade 1 anterolisthesis of C7 on T1. Vertebrae: Numerous lytic lesions throughout the thoracic spine. Large destructive lesion involving the left aspect of the T1 vertebral body, left-sided T1 posterior elements, and adjacent left first rib with prominent extraosseous soft tissue component involving the left C7-T1 and T1-2 neural foramina, greatly progressed from the 09/10/2020 chest CTA. 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 New lesion involving the left lamina of T10 with cortical disruption and extraosseous tumor extension posteriorly. No acute fracture. Paraspinal and other soft tissues: Aortic and coronary atherosclerosis. Calcified right hilar lymph nodes. Disc levels: Diffuse thoracic disc degeneration. Mild-to-moderate spinal stenosis at T11-12 due to circumferential disc bulging, endplate spurring, and severe right and moderate left facet arthrosis. CT LUMBAR SPINE FINDINGS Segmentation: 5 lumbar type vertebrae. Alignment: Moderate lumbar dextroscoliosis. Chronic grade 1 anterolisthesis of L4 on L5. Vertebrae: Numerous lytic lesions throughout the lumbar spine and included sacrum including a dominant 2.5 cm lesion in the L1 vertebral body, similar to the 11/27/2019 CT of the abdomen and pelvis. No acute fracture. Prior Ray cage fusion at L4-5. Paraspinal and other soft tissues: Bilateral renal atrophy. Abdominal aortic atherosclerosis. Disc levels: Advanced disc and facet degeneration from L2-3 to L5-S1. Prior laminectomies at L2-3, L3-4, and L4-5. Moderate spinal stenosis and severe left neural foraminal stenosis at L3-4 due to left eccentric disc bulging and posterior element hypertrophy. IMPRESSION: 1.  Widespread lytic bone lesions consistent with multiple myeloma. 2. Large destructive lesion involving the left aspect of vertebral body and posterior elements of T1 and adjacent left first rib, greatly progressed from 09/10/2020. 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 3. No acute thoracic or lumbar spine fracture. 4. Advanced lumbar disc and facet degeneration with moderate spinal stenosis and severe left neural foraminal stenosis at L3-4. 5. Aortic Atherosclerosis (ICD10-I70.0). Electronically Signed   By: Logan Bores M.D.   On: 10/11/2021 17:20   CT Lumbar Spine Wo Contrast  Result Date: 10/11/2021 CLINICAL DATA:  Multiple falls. Increased back pain. History of multiple myeloma. EXAM: CT THORACIC AND LUMBAR SPINE WITHOUT CONTRAST TECHNIQUE: Multidetector CT imaging of the thoracic and lumbar spine was performed without contrast. Multiplanar CT image reconstructions were also generated. COMPARISON:  CTA chest 09/10/2020. CT abdomen and pelvis 11/27/2019. FINDINGS: CT THORACIC SPINE FINDINGS Alignment: Unchanged grade 1 anterolisthesis of C7 on T1. Vertebrae: Numerous lytic lesions throughout the thoracic spine. Large destructive lesion involving the left aspect of the T1 vertebral body, left-sided T1 posterior elements, and adjacent left first rib with prominent extraosseous soft tissue component involving the left C7-T1 and T1-2 neural  foramina, greatly progressed from the 09/10/2020 chest CTA. 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 New lesion involving the left lamina of T10 with cortical disruption and extraosseous tumor extension posteriorly. No acute fracture. Paraspinal and other soft tissues: Aortic and coronary atherosclerosis. Calcified right hilar lymph nodes. Disc levels: Diffuse thoracic disc degeneration. Mild-to-moderate spinal stenosis at T11-12 due to circumferential disc bulging, endplate spurring, and  severe right and moderate left facet arthrosis. CT LUMBAR SPINE FINDINGS Segmentation: 5 lumbar type vertebrae. Alignment: Moderate lumbar dextroscoliosis. Chronic grade 1 anterolisthesis of L4 on L5. Vertebrae: Numerous lytic lesions throughout the lumbar spine and included sacrum including a dominant 2.5 cm lesion in the L1 vertebral body, similar to the 11/27/2019 CT of the abdomen and pelvis. No acute fracture. Prior Ray cage fusion at L4-5. Paraspinal and other soft tissues: Bilateral renal atrophy. Abdominal aortic atherosclerosis. Disc levels: Advanced disc and facet degeneration from L2-3 to L5-S1. Prior laminectomies at L2-3, L3-4, and L4-5. Moderate spinal stenosis and severe left neural foraminal stenosis at L3-4 due to left eccentric disc bulging and posterior element hypertrophy. IMPRESSION: 1. Widespread lytic bone lesions consistent with multiple myeloma. 2. Large destructive lesion involving the left aspect of vertebral body and posterior elements of T1 and adjacent left first rib, greatly progressed from 09/10/2020. 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 3. No acute thoracic or lumbar spine fracture. 4. Advanced lumbar disc and facet degeneration with moderate spinal stenosis and severe left neural foraminal stenosis at L3-4. 5. Aortic Atherosclerosis (ICD10-I70.0). Electronically Signed   By: Logan Bores M.D.   On: 10/11/2021 17:20   DG Chest Port 1 View  Result Date: 10/11/2021 CLINICAL DATA:  Chest pain EXAM: PORTABLE CHEST 1 VIEW COMPARISON:  01/12/2021 FINDINGS: Transverse diameter of heart is increased. There are no signs of pulmonary edema or focal pulmonary consolidation. There is no pleural effusion or pneumothorax. Pacemaker battery is seen in the left infraclavicular region with tips of leads in right atrium and right ventricle. IMPRESSION: Cardiomegaly. There are no signs of pulmonary edema or focal pulmonary consolidation. Electronically  Signed   By: Elmer Picker M.D.   On: 10/11/2021 13:09

## 2021-10-14 DIAGNOSIS — C9000 Multiple myeloma not having achieved remission: Secondary | ICD-10-CM

## 2021-10-14 DIAGNOSIS — Z992 Dependence on renal dialysis: Secondary | ICD-10-CM

## 2021-10-14 DIAGNOSIS — N186 End stage renal disease: Secondary | ICD-10-CM

## 2021-10-14 DIAGNOSIS — D631 Anemia in chronic kidney disease: Secondary | ICD-10-CM

## 2021-10-14 LAB — KAPPA/LAMBDA LIGHT CHAINS
Kappa free light chain: 381.4 mg/L — ABNORMAL HIGH (ref 3.3–19.4)
Kappa, lambda light chain ratio: 24.93 — ABNORMAL HIGH (ref 0.26–1.65)
Lambda free light chains: 15.3 mg/L (ref 5.7–26.3)

## 2021-10-14 MED ORDER — DARBEPOETIN ALFA 40 MCG/0.4ML IJ SOSY
40.0000 ug | PREFILLED_SYRINGE | INTRAMUSCULAR | Status: DC
Start: 2021-10-15 — End: 2021-10-14

## 2021-10-14 MED ORDER — CYCLOBENZAPRINE HCL 5 MG PO TABS: 5.0000 mg | ORAL_TABLET | Freq: Three times a day (TID) | ORAL | 0 refills | Status: DC | PRN

## 2021-10-14 MED ORDER — CHLORHEXIDINE GLUCONATE CLOTH 2 % EX PADS
6.0000 | MEDICATED_PAD | Freq: Every day | CUTANEOUS | Status: DC
  Administered 2021-10-14: 10:00:00 6 via TOPICAL

## 2021-10-14 NOTE — Progress Notes (Signed)
Occupational Therapy Treatment Patient Details Name: [name] MRN: [id number] DOB: 1930/07/14 Today's Date: 10/14/2021   History of present illness 87 y.o. F who presents 10/11/2021 after an assisted fall at dialysis. Imaging studies obtained during this hospitalization showed widespread bony lesions consistent with worsening metastatic multiple myeloma. Significant PMH: ESRD, atrial flutter, DM2, depression, multiple myeloma.   OT comments  Pt making progress with functional goals. Session focused on sitting EOB, LB dressing to don socks, sit - stand to RW, walking to toilet, toilet transfers, toileting, grooming/hygiene at sink and transfer to recliner. MD in to see pt at end of session. OT will continue to follow acutely to maximize level of function ad safety   Recommendations for follow up therapy are one component of a multi-disciplinary discharge planning process, led by the attending physician.  Recommendations may be updated based on patient status, additional functional criteria and insurance authorization.    Follow Up Recommendations  Home health OT    Assistance Recommended at Discharge Intermittent Supervision/Assistance  Equipment Recommendations  None recommended by OT;Other (comment) (pt has all necessary DME at home)    Recommendations for Other Services      Precautions / Restrictions Precautions Precautions: Fall Restrictions Weight Bearing Restrictions: No       Mobility Bed Mobility Overal bed mobility: Needs Assistance Bed Mobility: Supine to Sit     Supine to sit: Min guard;HOB elevated Sit to supine: Min guard;HOB elevated   General bed mobility comments: no assist required to get to EOB, use of rail.    Transfers Overall transfer level: Needs assistance Equipment used: Rolling walker (2 wheels) Transfers: Sit to/from Stand Sit to Stand: Min assist           General transfer comment: Min A to power up     Balance Overall balance  assessment: Needs assistance Sitting-balance support: Feet supported;No upper extremity supported Sitting balance-Leahy Scale: Fair Sitting balance - Comments: ABle to bend over in bed and donn socks   Standing balance support: During functional activity Standing balance-Leahy Scale: Poor Standing balance comment: reliant on RW for dynamic tasks; LOB posterior without UE support when washing hands at sink, leaning on forearms                           ADL either performed or assessed with clinical judgement   ADL Overall ADL's : Needs assistance/impaired     Grooming: Wash/dry face;Wash/dry hands;Standing;Min guard               Lower Body Dressing: Supervision/safety;Sitting/lateral leans Lower Body Dressing Details (indicate cue type and reason): simulated donning socks sitting EOB Toilet Transfer: Min guard;Ambulation;Regular Toilet;Rolling walker (2 wheels)   Toileting- Clothing Manipulation and Hygiene: Minimal assistance;Sit to/from stand Toileting - Clothing Manipulation Details (indicate cue type and reason): min A to move gown prior to sitting on commode     Functional mobility during ADLs: Min guard;Rolling walker (2 wheels);Cueing for safety      Extremity/Trunk Assessment Upper Extremity Assessment Upper Extremity Assessment: Generalized weakness   Lower Extremity Assessment Lower Extremity Assessment: Defer to PT evaluation   Cervical / Trunk Assessment Cervical / Trunk Assessment: Kyphotic    Vision Baseline Vision/History: 1 Wears glasses Ability to See in Adequate Light: 0 Adequate Patient Visual Report: No change from baseline     Perception     Praxis      Cognition Arousal/Alertness: Awake/alert Behavior During Therapy: [facility] for tasks  assessed/performed Overall Cognitive Status: History of cognitive impairments - at baseline                                 General Comments: STM deficits noted, needs repetition.  Poor awareness of safety/deficits          Exercises     Shoulder Instructions       General Comments      Pertinent Vitals/ Pain       Pain Assessment: No/denies pain Faces Pain Scale: Hurts little more Breathing: normal Facial Expression: facial grimacing Pain Location: back - underneath shoulder blades (chronic) Pain Descriptors / Indicators: Aching;Discomfort;Constant Pain Intervention(s): Monitored during session;Repositioned  Home Living                                          Prior Functioning/Environment              Frequency  Min 2X/week        Progress Toward Goals  OT Goals(current goals can now be found in the care plan section)  Progress towards OT goals: Progressing toward goals     Plan Discharge plan remains appropriate    Co-evaluation                 AM-PAC OT "6 Clicks" Daily Activity     Outcome Measure   Help from another person eating meals?: None Help from another person taking care of personal grooming?: A Little Help from another person toileting, which includes using toliet, bedpan, or urinal?: A Little Help from another person bathing (including washing, rinsing, drying)?: A Little Help from another person to put on and taking off regular upper body clothing?: None Help from another person to put on and taking off regular lower body clothing?: A Little 6 Click Score: 20    End of Session Equipment Utilized During Treatment: Gait belt;Rolling walker (2 wheels)  OT Visit Diagnosis: Unsteadiness on feet (R26.81);Other abnormalities of gait and mobility (R26.89);Repeated falls (R29.6);Muscle weakness (generalized) (M62.81);History of falling (Z91.81)   Activity Tolerance Patient tolerated treatment well   Patient Left with call bell/phone within reach;with family/visitor present;in chair;with chair alarm set   Nurse Communication          Time: 7893-8101 OT Time Calculation (min): 20  min  Charges: OT General Charges $OT Visit: 1 Visit OT Treatments $Self Care/Home Management : 8-22 mins   Britt Bottom 10/14/2021, 1:01 PM

## 2021-10-14 NOTE — Progress Notes (Signed)
Physical Therapy Treatment Patient Details Name: [name] MRN: [id number] DOB: 06/28/1930 Today's Date: 10/14/2021   History of Present Illness 87 y.o. F who presents 10/11/2021 after an assisted fall at dialysis. Imaging studies obtained during this hospitalization showed widespread bony lesions consistent with worsening metastatic multiple myeloma. Significant PMH: ESRD, atrial flutter, DM2, depression, multiple myeloma.    PT Comments    Patient progressing slowly towards PT goals. Continues to demonstrate impaired balance especially having difficulty with turns and managing RW when attempting to sit on different surfaces. Fatigues quickly with mobility limiting distance. Due to above, pt is a fall risk and would benefit from close supervision/hands on assist for all OOB mobility at home. Will follow.   Recommendations for follow up therapy are one component of a multi-disciplinary discharge planning process, led by the attending physician.  Recommendations may be updated based on patient status, additional functional criteria and insurance authorization.  Follow Up Recommendations  Home health PT     Assistance Recommended at Discharge Frequent or constant Supervision/Assistance  Equipment Recommendations  None recommended by PT    Recommendations for Other Services       Precautions / Restrictions Precautions Precautions: Fall Restrictions Weight Bearing Restrictions: No     Mobility  Bed Mobility Overal bed mobility: Needs Assistance Bed Mobility: Supine to Sit     Supine to sit: Min guard;HOB elevated Sit to supine: Min guard;HOB elevated   General bed mobility comments: no assist required to get to EOB, use of rail.    Transfers Overall transfer level: Needs assistance Equipment used: Rolling walker (2 wheels) Transfers: Sit to/from Stand Sit to Stand: Min assist           General transfer comment: Min A to power to standing with assist to  steady as well. Stood from [employer], from [employer]. Difficulty with managing RW when attempting to sit, pushing it away and almost tripping over the legs of it; needs cues to keep it with her for turning.    Ambulation/Gait Ambulation/Gait assistance: Min assist Gait Distance (Feet): 15 Feet (x2 bouts) Assistive device: Rolling walker (2 wheels) Gait Pattern/deviations: Step-to pattern;Decreased stride length;Trunk flexed;Wide base of support;Step-through pattern Gait velocity: decreased     General Gait Details: SLow, unsteady gait with Min A for balance and RW management, 1 LOB when standing at sink and when walking into bathroom. Fatigues quickly and reports she cannot do anymore walking.   Stairs             Wheelchair Mobility    Modified Rankin (Stroke Patients Only)       Balance Overall balance assessment: Needs assistance Sitting-balance support: Feet supported;No upper extremity supported Sitting balance-Leahy Scale: Fair Sitting balance - Comments: ABle to bend over in bed and donn socks   Standing balance support: During functional activity Standing balance-Leahy Scale: Poor Standing balance comment: reliant on RW for dynamic tasks; LOB posterior without UE support when washing hands at sink, leaning on forearms                            Cognition Arousal/Alertness: Awake/alert Behavior During Therapy: WFL for tasks assessed/performed Overall Cognitive Status: History of cognitive impairments - at baseline                                 General Comments: STM deficits noted, needs repetition.  Poor awareness of safety/deficits. "what was that thing i was just telling my son about?"        Exercises      General Comments        Pertinent Vitals/Pain Pain Assessment: Faces Faces Pain Scale: Hurts little more Pain Location: back - underneath shoulde rblades- chronic Pain Descriptors / Indicators:  Aching;Discomfort;Constant Pain Intervention(s): Monitored during session;Repositioned    Home Living                          Prior Function            PT Goals (current goals can now be found in the care plan section) Progress towards PT goals: Progressing toward goals (slowly)    Frequency    Min 3X/week      PT Plan Current plan remains appropriate    Co-evaluation              AM-PAC PT "6 Clicks" Mobility   Outcome Measure  Help needed turning from your back to your side while in a flat bed without using bedrails?: A Little Help needed moving from lying on your back to sitting on the side of a flat bed without using bedrails?: A Little Help needed moving to and from a bed to a chair (including a wheelchair)?: A Little Help needed standing up from a chair using your arms (e.g., wheelchair or bedside chair)?: A Little Help needed to walk in hospital room?: A Little Help needed climbing 3-5 steps with a railing? : A Lot 6 Click Score: 17    End of Session Equipment Utilized During Treatment: Gait belt Activity Tolerance: Patient limited by fatigue Patient left: in bed;with call bell/phone within reach;with bed alarm set Nurse Communication: Mobility status PT Visit Diagnosis: Unsteadiness on feet (R26.81);History of falling (Z91.81);Muscle weakness (generalized) (M62.81)     Time: 1040-1100 PT Time Calculation (min) (ACUTE ONLY): 20 min  Charges:  $Therapeutic Activity: 8-22 mins                     Marisa Severin, PT, DPT Acute Rehabilitation Services Pager [phone] Office [phone]      Marguarite Arbour A Sabra Heck 10/14/2021, 12:28 PM

## 2021-10-14 NOTE — Progress Notes (Signed)
D/C order noted. Contacted [place] and spoke to [place], Therapist, sports. Clinic advised of pt's d/c today and pt to resume care tomorrow.    Melven Sartorius Renal Navigator [phone]

## 2021-10-14 NOTE — Care Management Important Message (Signed)
Important Message  Patient Details  Name: [name] MRN: [id number] Date of Birth: [birth year]   Medicare Important Message Given:  Yes     [name] 10/14/2021, 1:43 PM

## 2021-10-14 NOTE — Progress Notes (Signed)
Mobility Specialist Progress Note:   10/14/21 1050  Mobility  Activity Ambulated to bathroom  Level of Assistance Minimal assist, patient does 75% or more  Assistive Device Front wheel walker  Distance Ambulated (ft) 30 ft  Mobility Out of bed for toileting  Mobility Response Tolerated well  Mobility performed by Mobility specialist  $Mobility charge 1 Mobility   Pt requesting to go to BR prior to session. Small BM successful. Pt declined further mobility d/t fatigue/back pain. Pt left in bed per request, with all needs met.   Nelta Numbers Mobility Specialist  Phone [phone]

## 2021-10-14 NOTE — Discharge Summary (Signed)
Discharge Summary  [name] HWE:[id number] DOB: 01/06/1930  PCP: Lucianne Lei, MD  Admit date: 10/11/2021 Discharge date: 10/14/2021  Time spent: 25 minutes  Recommendations for Outpatient Follow-up:  Patient will keep oncology appointment on 10/24/2021 Patient be discharged home with home health.  Discharge Diagnoses:  Active Hospital Problems   Diagnosis Date Noted   Ambulatory dysfunction 10/11/2021   Metastatic multiple myeloma to bone ([place]) 10/11/2021   Elevated troponin 04/02/2020   Cancer associated pain 03/26/2019   ESRD (end stage renal disease) on dialysis ([place]) 03/14/2019   Anemia in chronic kidney disease 03/11/2019   Multiple myeloma not having achieved remission ([place]) 03/03/2019   Diabetes mellitus ([place]) 02/24/2008    Resolved Hospital Problems  No resolved problems to display.    Discharge Condition: Improved, being discharged home with home health  Diet recommendation: Carb modified, low-sodium  Vitals:   10/14/21 0532 10/14/21 0739  BP: 136/67 116/66  Pulse: 72 70  Resp: 16 16  Temp: 98.1 F (36.7 C) (!) 97.5 F (36.4 C)  SpO2: 100% 100%    History of present illness:  87 year old female with past medical history of end-stage renal disease on hemodialysis plus type 2 diabetes mellitus and multiple myeloma type IgG followed by oncology who presented to the emergency room on 12/27 with accidental fall and weakness.  On scans, and found to have increasing widespread lytic lesions.  Brought in for further work-up and evaluation.  Hospital Course:  Principal Problem:   Ambulatory dysfunction: With history of multiple myeloma and metastases to bone.  Evaluated by oncology.  Attempt is made to wean her off of her steroids which led to now more presentation of her condition.  Seen by PT and OT who are recommending home health.  Oncology will follow-up as outpatient. Active Problems:   Diabetes mellitus ([place]) Home [facility] held during  hospitalization, continue on sliding scale.  Resume upon discharge.    ESRD (end stage renal disease) on dialysis [facility]): Appreciate nephrology help.  Patient received hemodialysis on 12/29.    Anemia in chronic kidney disease: Stable.  History of atrial flutter: CHADS2 score of greater than 4.  Continued on her Eliquis.  Constipation: Continued on bowel regimen and Reglan lactulose MiraLAX and Senokot.  Procedures: Hemodialysis, last session on 12/29  Consultations: Nephrology Oncology  Discharge Exam: BP 116/66 (BP Location: Left Arm)    Pulse 70    Temp (!) 97.5 F (36.4 C) (Oral)    Resp 16    Ht 5' 5.5" (1.664 m)    Wt 59.4 kg    SpO2 100%    BMI 21.46 kg/m   General: Alert and oriented x3, no acute distress Cardiovascular: Irregular rhythm, rate controlled Respiratory: Clear to auscultation bilaterally  Discharge Instructions You were cared for by a hospitalist during your hospital stay. If you have any questions about your discharge medications or the care you received while you were in the hospital after you are discharged, you can call the unit and asked to speak with the hospitalist on call if the hospitalist that took care of you is not available. Once you are discharged, your primary care physician will handle any further medical issues. Please note that NO REFILLS for any discharge medications will be authorized once you are discharged, as it is imperative that you return to your primary care physician (or establish a relationship with a primary care physician if you do not have one) for your aftercare needs so  that they can reassess your need for medications and monitor your lab values.  Discharge Instructions     Diet - low sodium heart healthy   Complete by: As directed    Increase activity slowly   Complete by: As directed       Allergies as of 10/14/2021       Reactions   Aspirin Other (See Comments)   REACTION: STOMACH ISSUES WITH DOSE HIGHER THAN 81 MG   Other reaction(s): GI Upset (intolerance), Other (See Comments) REACTION: STOMACH ISSUES WITH DOSE HIGHER THAN 81 MG  REACTION: STOMACH ISSUES WITH DOSE HIGHER THAN 81 MG    Hydrocodone-acetaminophen Nausea And Vomiting   Morphine Nausea And Vomiting   Penicillins Rash   Patient took injection and tablets, had a reaction. She has taken amoxicillin with no reaction Has patient had a PCN reaction causing immediate rash, facial/tongue/throat swelling, SOB or lightheadedness with hypotension: Yes Has patient had a PCN reaction causing severe rash involving mucus membranes or skin necrosis: Yes Has patient had a PCN reaction that required hospitalization No Has patient had a PCN reaction occurring within the last 10 years: No If all of the above answers are "NO", then m Patient took injection and tablets, had a reaction. She has taken amoxicillin with no reaction Patient took injection and tablets, had a reaction. She has taken amoxicillin with no reaction Has patient had a PCN reaction causing immediate rash, facial/tongue/throat swelling, SOB or lightheadedness with hypotension: Yes Has patient had a PCN reaction causing severe rash involving mucus membranes or skin necrosis: Yes Has patient had a PCN reaction that required hospitalization No Has patient had a PCN reaction occurring within the last 10 years: No If all of the above answers are "NO", then m   Betaine Nausea And Vomiting   Dacarbazine Nausea And Vomiting   Ketorolac Tromethamine Itching   Brimonidine Itching   Diltiazem Hcl Rash   Hydrocodone-acetaminophen Nausea And Vomiting   Neurontin [gabapentin] Nausea And Vomiting   Sulfacetamide Sodium Rash        Medication List     TAKE these medications    acetaminophen 500 MG tablet Commonly known as: TYLENOL Take 1,000 mg by mouth 2 (two) times daily as needed for mild pain or moderate pain.   acyclovir 400 MG tablet Commonly known as: ZOVIRAX Take 1 tablet (400 mg  total) by mouth daily.   allopurinol 100 MG tablet Commonly known as: ZYLOPRIM Take 0.5 tablets (50 mg total) by mouth daily.   cholecalciferol 25 MCG (1000 UNIT) tablet Commonly known as: VITAMIN D3 Take 2,000 Units by mouth daily.   citalopram 10 MG tablet Commonly known as: CELEXA Take 10 mg by mouth daily.   cyclobenzaprine 5 MG tablet Commonly known as: FLEXERIL Take 1 tablet (5 mg total) by mouth 3 (three) times daily as needed for muscle spasms.   dexamethasone 2 MG tablet Commonly known as: DECADRON TAKE 1 TABLET BY MOUTH EVERY DAY What changed: Another medication with the same name was changed. Make sure you understand how and when to take each.   dexamethasone 1 MG tablet Commonly known as: DECADRON Take 1 tablet (1 mg total) by mouth daily. What changed: when to take this   Dialyvite 800-Zinc 15 0.8 MG Tabs Take 1 tablet by mouth daily.   Eliquis 2.5 MG Tabs tablet Generic drug: apixaban TAKE 1 TABLET BY MOUTH TWICE A DAY What changed: how much to take   feeding supplement (NEPRO CARB STEADY) Liqd  Take 237 mLs by mouth 2 (two) times daily between meals.   fluorometholone 0.1 % ophthalmic suspension Commonly known as: FML Place 1 drop into the right eye daily as needed (eyes).   glimepiride 1 MG tablet Commonly known as: AMARYL Take 0.5 mg by mouth daily.   lactulose 10 GM/15ML solution Commonly known as: CHRONULAC TAKE 15 MLS (10 G TOTAL) BY MOUTH 3 (THREE) TIMES DAILY. What changed: See the new instructions.   lidocaine 5 % Commonly known as: LIDODERM Place 1 patch onto the skin daily.   lidocaine-prilocaine cream Commonly known as: EMLA Apply 1 application topically as needed [facility]).   Lyrica 50 MG capsule Generic drug: pregabalin TAKE 1 CAPSULE (50 MG TOTAL) BY MOUTH AT BEDTIME.   metoCLOPramide 5 MG tablet Commonly known as: REGLAN TAKE 1 TABLET BY MOUTH 3 TIMES DAILY BEFORE MEALS What changed: See the new instructions.   midodrine  10 MG tablet Commonly known as: PROAMATINE Take 10 mg by mouth as directed. Low bp, and before dialysis   multivitamin tablet Take 1 tablet by mouth daily.   ondansetron 4 MG tablet Commonly known as: ZOFRAN Take 1 tablet (4 mg total) by mouth every 8 (eight) hours as needed for nausea.   oxyCODONE 5 MG immediate release tablet Commonly known as: Oxy IR/ROXICODONE Take 1 tablet (5 mg total) by mouth every 4 (four) hours as needed for severe pain.   pantoprazole 40 MG tablet Commonly known as: PROTONIX TAKE 1 TABLET (40 MG TOTAL) BY MOUTH DAILY AS NEEDED (ACID REFLUX/INDIGESTION.).   polyethylene glycol 17 g packet Commonly known as: MIRALAX / GLYCOLAX Take 17 g by mouth daily.   senna-docusate 8.6-50 MG tablet Commonly known as: Senokot-S Take 2 tablets by mouth 2 (two) times daily.   sevelamer carbonate 800 MG tablet Commonly known as: RENVELA Take 800 mg by mouth in the morning, at noon, and at bedtime.       Allergies  Allergen Reactions   Aspirin Other (See Comments)    REACTION: STOMACH ISSUES WITH DOSE HIGHER THAN 81 MG  Other reaction(s): GI Upset (intolerance), Other (See Comments) REACTION: STOMACH ISSUES WITH DOSE HIGHER THAN 81 MG  REACTION: STOMACH ISSUES WITH DOSE HIGHER THAN 81 MG    Hydrocodone-Acetaminophen Nausea And Vomiting   Morphine Nausea And Vomiting   Penicillins Rash    Patient took injection and tablets, had a reaction. She has taken amoxicillin with no reaction Has patient had a PCN reaction causing immediate rash, facial/tongue/throat swelling, SOB or lightheadedness with hypotension: Yes Has patient had a PCN reaction causing severe rash involving mucus membranes or skin necrosis: Yes Has patient had a PCN reaction that required hospitalization No Has patient had a PCN reaction occurring within the last 10 years: No If all of the above answers are "NO", then m Patient took injection and tablets, had a reaction. She has taken amoxicillin  with no reaction Patient took injection and tablets, had a reaction. She has taken amoxicillin with no reaction Has patient had a PCN reaction causing immediate rash, facial/tongue/throat swelling, SOB or lightheadedness with hypotension: Yes Has patient had a PCN reaction causing severe rash involving mucus membranes or skin necrosis: Yes Has patient had a PCN reaction that required hospitalization No Has patient had a PCN reaction occurring within the last 10 years: No If all of the above answers are "NO", then m   Betaine Nausea And Vomiting   Dacarbazine Nausea And Vomiting   Ketorolac Tromethamine Itching  Brimonidine Itching   Diltiazem Hcl Rash   Hydrocodone-Acetaminophen Nausea And Vomiting   Neurontin [Gabapentin] Nausea And Vomiting   Sulfacetamide Sodium Rash    Follow-up Information     [facility].   Specialty: Home Health Services Contact information: [address] [place] [address] [phone]         Heath Lark, MD Follow up.   Specialty: Hematology and Oncology Why: Keep appointment on 10/24/21 Contact information: [place] [id number] [phone]                  The results of significant diagnostics from this hospitalization (including imaging, microbiology, ancillary and laboratory) are listed below for reference.    Significant Diagnostic Studies: CT Thoracic Spine Wo Contrast  Result Date: 10/11/2021 CLINICAL DATA:  Multiple falls. Increased back pain. History of multiple myeloma. EXAM: CT THORACIC AND LUMBAR SPINE WITHOUT CONTRAST TECHNIQUE: Multidetector CT imaging of the thoracic and lumbar spine was performed without contrast. Multiplanar CT image reconstructions were also generated. COMPARISON:  CTA chest 09/10/2020. CT abdomen and pelvis 11/27/2019. FINDINGS: CT THORACIC SPINE FINDINGS Alignment: Unchanged grade 1 anterolisthesis of C7 on T1. Vertebrae: Numerous  lytic lesions throughout the thoracic spine. Large destructive lesion involving the left aspect of the T1 vertebral body, left-sided T1 posterior elements, and adjacent left first rib with prominent extraosseous soft tissue component involving the left C7-T1 and T1-2 neural foramina, greatly progressed from the 09/10/2020 chest CTA. 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 New lesion involving the left lamina of T10 with cortical disruption and extraosseous tumor extension posteriorly. No acute fracture. Paraspinal and other soft tissues: Aortic and coronary atherosclerosis. Calcified right hilar lymph nodes. Disc levels: Diffuse thoracic disc degeneration. Mild-to-moderate spinal stenosis at T11-12 due to circumferential disc bulging, endplate spurring, and severe right and moderate left facet arthrosis. CT LUMBAR SPINE FINDINGS Segmentation: 5 lumbar type vertebrae. Alignment: Moderate lumbar dextroscoliosis. Chronic grade 1 anterolisthesis of L4 on L5. Vertebrae: Numerous lytic lesions throughout the lumbar spine and included sacrum including a dominant 2.5 cm lesion in the L1 vertebral body, similar to the 11/27/2019 CT of the abdomen and pelvis. No acute fracture. Prior Ray cage fusion at L4-5. Paraspinal and other soft tissues: Bilateral renal atrophy. Abdominal aortic atherosclerosis. Disc levels: Advanced disc and facet degeneration from L2-3 to L5-S1. Prior laminectomies at L2-3, L3-4, and L4-5. Moderate spinal stenosis and severe left neural foraminal stenosis at L3-4 due to left eccentric disc bulging and posterior element hypertrophy. IMPRESSION: 1. Widespread lytic bone lesions consistent with multiple myeloma. 2. Large destructive lesion involving the left aspect of vertebral body and posterior elements of T1 and adjacent left first rib, greatly progressed from 09/10/2020. 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 3. No  acute thoracic or lumbar spine fracture. 4. Advanced lumbar disc and facet degeneration with moderate spinal stenosis and severe left neural foraminal stenosis at L3-4. 5. Aortic Atherosclerosis (ICD10-I70.0). Electronically Signed   By: Logan Bores M.D.   On: 10/11/2021 17:20   CT Lumbar Spine Wo Contrast  Result Date: 10/11/2021 CLINICAL DATA:  Multiple falls. Increased back pain. History of multiple myeloma. EXAM: CT THORACIC AND LUMBAR SPINE WITHOUT CONTRAST TECHNIQUE: Multidetector CT imaging of the thoracic and lumbar spine was performed without contrast. Multiplanar CT image reconstructions were also generated. COMPARISON:  CTA chest 09/10/2020. CT abdomen and pelvis 11/27/2019. FINDINGS: CT THORACIC  SPINE FINDINGS Alignment: Unchanged grade 1 anterolisthesis of C7 on T1. Vertebrae: Numerous lytic lesions throughout the thoracic spine. Large destructive lesion involving the left aspect of the T1 vertebral body, left-sided T1 posterior elements, and adjacent left first rib with prominent extraosseous soft tissue component involving the left C7-T1 and T1-2 neural foramina, greatly progressed from the 09/10/2020 chest CTA. 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 New lesion involving the left lamina of T10 with cortical disruption and extraosseous tumor extension posteriorly. No acute fracture. Paraspinal and other soft tissues: Aortic and coronary atherosclerosis. Calcified right hilar lymph nodes. Disc levels: Diffuse thoracic disc degeneration. Mild-to-moderate spinal stenosis at T11-12 due to circumferential disc bulging, endplate spurring, and severe right and moderate left facet arthrosis. CT LUMBAR SPINE FINDINGS Segmentation: 5 lumbar type vertebrae. Alignment: Moderate lumbar dextroscoliosis. Chronic grade 1 anterolisthesis of L4 on L5. Vertebrae: Numerous lytic lesions throughout the lumbar spine and included sacrum including a dominant 2.5 cm  lesion in the L1 vertebral body, similar to the 11/27/2019 CT of the abdomen and pelvis. No acute fracture. Prior Ray cage fusion at L4-5. Paraspinal and other soft tissues: Bilateral renal atrophy. Abdominal aortic atherosclerosis. Disc levels: Advanced disc and facet degeneration from L2-3 to L5-S1. Prior laminectomies at L2-3, L3-4, and L4-5. Moderate spinal stenosis and severe left neural foraminal stenosis at L3-4 due to left eccentric disc bulging and posterior element hypertrophy. IMPRESSION: 1. Widespread lytic bone lesions consistent with multiple myeloma. 2. Large destructive lesion involving the left aspect of vertebral body and posterior elements of T1 and adjacent left first rib, greatly progressed from 09/10/2020. 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 3. No acute thoracic or lumbar spine fracture. 4. Advanced lumbar disc and facet degeneration with moderate spinal stenosis and severe left neural foraminal stenosis at L3-4. 5. Aortic Atherosclerosis (ICD10-I70.0). Electronically Signed   By: Logan Bores M.D.   On: 10/11/2021 17:20   DG Chest Port 1 View  Result Date: 10/11/2021 CLINICAL DATA:  Chest pain EXAM: PORTABLE CHEST 1 VIEW COMPARISON:  01/12/2021 FINDINGS: Transverse diameter of heart is increased. There are no signs of pulmonary edema or focal pulmonary consolidation. There is no pleural effusion or pneumothorax. Pacemaker battery is seen in the left infraclavicular region with tips of leads in right atrium and right ventricle. IMPRESSION: Cardiomegaly. There are no signs of pulmonary edema or focal pulmonary consolidation. Electronically Signed   By: Elmer Picker M.D.   On: 10/11/2021 13:09    Microbiology: Recent Results (from the past 240 hour(s))  Resp Panel by RT-PCR (Flu A&B, Covid) Nasopharyngeal Swab     Status: None   Collection Time: 10/11/21  2:39 PM   Specimen: Nasopharyngeal Swab; Nasopharyngeal(NP) swabs in vial transport  medium  Result Value Ref Range Status   SARS Coronavirus 2 by RT PCR NEGATIVE NEGATIVE Final    Comment: (NOTE) SARS-CoV-2 target nucleic acids are NOT DETECTED.  The SARS-CoV-2 RNA is generally detectable in upper respiratory specimens during the acute phase of infection. The lowest concentration of SARS-CoV-2 viral copies this assay can detect is 138 copies/mL. A negative result does not preclude SARS-Cov-2 infection and should not be used as the sole basis for treatment or other patient management decisions. A negative result may occur with  improper specimen collection/handling, submission of specimen other than nasopharyngeal swab, presence of viral mutation(s) within the areas targeted by this assay, and inadequate number of viral copies(<138 copies/mL). A  negative result must be combined with clinical observations, patient history, and epidemiological information. The expected result is Negative.  Fact Sheet for Patients:  EntrepreneurPulse.com.au  Fact Sheet for Healthcare Providers:  IncredibleEmployment.be  This test is no t yet approved or cleared by the [place] FDA and  has been authorized for detection and/or diagnosis of SARS-CoV-2 by FDA under an Emergency Use Authorization (EUA). This EUA will remain  in effect (meaning this test can be used) for the duration of the COVID-19 declaration under Section 564(b)(1) of the Act, 21 U.S.C.section 360bbb-3(b)(1), unless the authorization is terminated  or revoked sooner.       Influenza A by PCR NEGATIVE NEGATIVE Final   Influenza B by PCR NEGATIVE NEGATIVE Final    Comment: (NOTE) The Xpert Xpress SARS-CoV-2/FLU/RSV plus assay is intended as an aid in the diagnosis of influenza from Nasopharyngeal swab specimens and should not be used as a sole basis for treatment. Nasal washings and aspirates are unacceptable for Xpert Xpress SARS-CoV-2/FLU/RSV testing.  Fact Sheet for  Patients: EntrepreneurPulse.com.au  Fact Sheet for Healthcare Providers: IncredibleEmployment.be  This test is not yet approved or cleared by the [place] FDA and has been authorized for detection and/or diagnosis of SARS-CoV-2 by FDA under an Emergency Use Authorization (EUA). This EUA will remain in effect (meaning this test can be used) for the duration of the COVID-19 declaration under Section 564(b)(1) of the Act, 21 U.S.C. section 360bbb-3(b)(1), unless the authorization is terminated or revoked.  Performed at [facility], [place] [address], [place], [place] [address]      Labs: Basic Metabolic Panel: Recent Labs  Lab 10/11/21 1220 10/12/21 0500 10/13/21 0211  NA 131* 129* 127*  K 4.5 4.8 4.3  CL 89* 89* 91*  CO2 26 22 24   GLUCOSE 96 70 134*  BUN 87* 101* 40*  CREATININE 7.86* 8.01* 4.38*  CALCIUM 9.3 8.8* 8.0*  PHOS  --   --  5.0*   Liver Function Tests: Recent Labs  Lab 10/11/21 1220 10/13/21 0211  AST 20  --   ALT 17  --   ALKPHOS 32*  --   BILITOT 0.6  --   PROT 6.8  --   ALBUMIN 3.2* 3.0*   Recent Labs  Lab 10/11/21 1220  LIPASE 23   No results for input(s): AMMONIA in the last 168 hours. CBC: Recent Labs  Lab 10/11/21 1220 10/12/21 0500 10/13/21 0211  WBC 9.3 8.0 7.4  NEUTROABS 8.0*  --  5.9  HGB 9.7* 8.8* 9.5*  HCT 30.7* 28.0* 29.5*  MCV 102.0* 101.1* 98.7  PLT 208 188 203   Cardiac Enzymes: No results for input(s): CKTOTAL, CKMB, CKMBINDEX, TROPONINI in the last 168 hours. BNP: BNP (last 3 results) No results for input(s): BNP in the last 8760 hours.  ProBNP (last 3 results) No results for input(s): PROBNP in the last 8760 hours.  CBG: No results for input(s): GLUCAP in the last 168 hours.     Signed:  Annita Brod, MD [facility] 10/14/2021, 3:11 PM

## 2021-10-14 NOTE — Progress Notes (Signed)
[facility] Progress Note   Subjective:   Seen in room, reports feeling well this AM. Denies SOB, CP, palpitations, dizziness, abdominal pain and nausea. Reports she did not eat much yesterday because she did not like the food, but appetite is ok.   Objective Vitals:   10/13/21 1845 10/13/21 2046 10/14/21 0532 10/14/21 0739  BP: 111/60 135/64 136/67 116/66  Pulse: 72 69 72 70  Resp: _0 Temp:  97.7 F (36.5 C) 98.1 F (36.7 C) (!) 97.5 F (36.4 C)  TempSrc:  Oral  Oral  SpO2: 100% 100% 100% 100%  Weight:      Height:       Physical Exam General: Well developed, elderly female, alert and in NAD Heart: RRR, no murmurs, rubs or gallops Lungs: CTA bilaterally without wheezing, rhonchi or rales Abdomen: Soft, non-tender, non-distended ,+BS Extremities: No edema b/l lower extremities Dialysis Access:  RUE AVF + bruit  Additional Objective Labs: Basic Metabolic Panel: Recent Labs  Lab 10/11/21 1220 10/12/21 0500 10/13/21 0211  NA 131* 129* 127*  K 4.5 4.8 4.3  CL 89* 89* 91*  CO2 _1 GLUCOSE 96 70 134*  BUN 87* 101* 40*  CREATININE 7.86* 8.01* 4.38*  CALCIUM 9.3 8.8* 8.0*  PHOS  --   --  5.0*   Liver Function Tests: Recent Labs  Lab 10/11/21 1220 10/13/21 0211  AST 20  --   ALT 17  --   ALKPHOS 32*  --   BILITOT 0.6  --   PROT 6.8  --   ALBUMIN 3.2* 3.0*   Recent Labs  Lab 10/11/21 1220  LIPASE 23   CBC: Recent Labs  Lab 10/11/21 1220 10/12/21 0500 10/13/21 0211  WBC 9.3 8.0 7.4  NEUTROABS 8.0*  --  5.9  HGB 9.7* 8.8* 9.5*  HCT 30.7* 28.0* 29.5*  MCV 102.0* 101.1* 98.7  PLT 208 188 203   Blood Culture    Component Value Date/Time   SDES URINE, CLEAN CATCH 11/27/2019 1525   SPECREQUEST NONE 11/27/2019 1525   CULT  11/27/2019 1525    NO GROWTH Performed at [facility], [place] [address], [place], [place] [address]    REPTSTATUS 11/28/2019 FINAL 11/27/2019 1525     Medications:   acyclovir  400 mg  Oral Daily   allopurinol  50 mg Oral Daily   apixaban  2.5 mg Oral BID   Chlorhexidine Gluconate Cloth  6 each Topical Q0600   citalopram  10 mg Oral Daily   dexamethasone  1 mg Oral QHS   dexamethasone  2 mg Oral Daily   lactulose  10 g Oral TID   metoCLOPramide  5 mg Oral TID AC   polyethylene glycol  17 g Oral Daily   pregabalin  50 mg Oral QHS   senna-docusate  2 tablet Oral BID   sevelamer carbonate  800 mg Oral TID WC    Dialysis Orders: [facility] TTS 4h   350/500  59.9kg  2/2.5Ca bath  RUA AVF Hep 2000 -micera 75 ug q4 wks, last 12/15  Assessment/Plan: Generalized weakness - K+ ok, well dialyzed. Debility likely related to progressive metastatic myeloma. Seen by oncology, no aggressive work up recommended, planned for outpatient folow up.  Multiple myeloma - had good response to initial Rx, but imaging here now is showing multiple spinal lesions suggesting recurrence/ worsening. See oncology notes, no aggressive work up planned.  ESRD - on HD TTS. Tolerating dialysis. Next  treatment Saturday, 12/31.  Hypotension/ vol - no vol excess on exam, slightly under EDW. BP stable. Takes midodrine in OP setting pre HD. Minimal UF goal w/ HD tomorrow MBD ckd - Calcium controlled. Phos 5.0. Continue binders.  Anemia ckd  - Hb 9.5, Per oncology notes, treatment has been palliative in nature so I believe it is reasonable to continue ESA, will give with HD tomorrow.  HFrEF - hx of EF 25-30%. No edema on CXR, exam neg for edema  Anice Paganini, PA-C 10/14/2021, 8:53 AM  [facility] Pager: [phone]

## 2021-10-15 ENCOUNTER — Telehealth: Payer: Self-pay | Admitting: Physician Assistant

## 2021-10-15 DIAGNOSIS — Z992 Dependence on renal dialysis: Secondary | ICD-10-CM | POA: Diagnosis not present

## 2021-10-15 DIAGNOSIS — N186 End stage renal disease: Secondary | ICD-10-CM | POA: Diagnosis not present

## 2021-10-15 DIAGNOSIS — E1122 Type 2 diabetes mellitus with diabetic chronic kidney disease: Secondary | ICD-10-CM | POA: Diagnosis not present

## 2021-10-15 DIAGNOSIS — N2581 Secondary hyperparathyroidism of renal origin: Secondary | ICD-10-CM | POA: Diagnosis not present

## 2021-10-15 NOTE — Telephone Encounter (Signed)
Transition of care contact from inpatient facility  Date of Discharge: 10/14/21 Date of Contact: 10/15/21 Method of contact: Phone  Contacted patient to discuss transition of care from inpatient admission. Patient is at dialysis but I spoke to her son. He reports patient had neck and back pain today but he is able to assist her with her ADLs and transport. Home health PT and OT are ordered. Reviewed meds, flexeril added. She has a follow up appointment with her oncologist on 10/24/20. We will plan to see Ms. Milhouse at dialysis this week.  Anice Paganini, PA-C 10/15/2021, 11:20 AM  [facility] Pager: [phone]

## 2021-10-16 DIAGNOSIS — Z992 Dependence on renal dialysis: Secondary | ICD-10-CM | POA: Diagnosis not present

## 2021-10-16 DIAGNOSIS — N186 End stage renal disease: Secondary | ICD-10-CM | POA: Diagnosis not present

## 2021-10-16 DIAGNOSIS — E1122 Type 2 diabetes mellitus with diabetic chronic kidney disease: Secondary | ICD-10-CM | POA: Diagnosis not present

## 2021-10-18 DIAGNOSIS — Z992 Dependence on renal dialysis: Secondary | ICD-10-CM | POA: Diagnosis not present

## 2021-10-18 DIAGNOSIS — N2581 Secondary hyperparathyroidism of renal origin: Secondary | ICD-10-CM | POA: Diagnosis not present

## 2021-10-18 DIAGNOSIS — N186 End stage renal disease: Secondary | ICD-10-CM | POA: Diagnosis not present

## 2021-10-18 LAB — MULTIPLE MYELOMA PANEL, SERUM
Albumin SerPl Elph-Mcnc: 3.2 g/dL (ref 2.9–4.4)
Albumin/Glob SerPl: 1.1 (ref 0.7–1.7)
Alpha 1: 0.2 g/dL (ref 0.0–0.4)
Alpha2 Glob SerPl Elph-Mcnc: 0.8 g/dL (ref 0.4–1.0)
B-Globulin SerPl Elph-Mcnc: 1.9 g/dL — ABNORMAL HIGH (ref 0.7–1.3)
Gamma Glob SerPl Elph-Mcnc: 0.2 g/dL — ABNORMAL LOW (ref 0.4–1.8)
Globulin, Total: 3.1 g/dL (ref 2.2–3.9)
IgA: 20 mg/dL — ABNORMAL LOW (ref 64–422)
IgG (Immunoglobin G), Serum: 1541 mg/dL (ref 586–1602)
IgM (Immunoglobulin M), Srm: 12 mg/dL — ABNORMAL LOW (ref 26–217)
M Protein SerPl Elph-Mcnc: 1.4 g/dL — ABNORMAL HIGH
Total Protein ELP: 6.3 g/dL (ref 6.0–8.5)

## 2021-10-20 DIAGNOSIS — N2581 Secondary hyperparathyroidism of renal origin: Secondary | ICD-10-CM | POA: Diagnosis not present

## 2021-10-20 DIAGNOSIS — N186 End stage renal disease: Secondary | ICD-10-CM | POA: Diagnosis not present

## 2021-10-20 DIAGNOSIS — Z992 Dependence on renal dialysis: Secondary | ICD-10-CM | POA: Diagnosis not present

## 2021-10-22 DIAGNOSIS — Z992 Dependence on renal dialysis: Secondary | ICD-10-CM | POA: Diagnosis not present

## 2021-10-22 DIAGNOSIS — N186 End stage renal disease: Secondary | ICD-10-CM | POA: Diagnosis not present

## 2021-10-22 DIAGNOSIS — N2581 Secondary hyperparathyroidism of renal origin: Secondary | ICD-10-CM | POA: Diagnosis not present

## 2021-10-24 ENCOUNTER — Inpatient Hospital Stay: Payer: Medicare PPO | Attending: Hematology and Oncology

## 2021-10-24 ENCOUNTER — Inpatient Hospital Stay: Payer: Medicare PPO | Admitting: Hematology and Oncology

## 2021-10-24 ENCOUNTER — Encounter: Payer: Self-pay | Admitting: Hematology and Oncology

## 2021-10-24 ENCOUNTER — Other Ambulatory Visit: Payer: Self-pay

## 2021-10-24 DIAGNOSIS — Z992 Dependence on renal dialysis: Secondary | ICD-10-CM | POA: Diagnosis not present

## 2021-10-24 DIAGNOSIS — D649 Anemia, unspecified: Secondary | ICD-10-CM | POA: Diagnosis not present

## 2021-10-24 DIAGNOSIS — Z79899 Other long term (current) drug therapy: Secondary | ICD-10-CM | POA: Insufficient documentation

## 2021-10-24 DIAGNOSIS — M5136 Other intervertebral disc degeneration, lumbar region: Secondary | ICD-10-CM | POA: Insufficient documentation

## 2021-10-24 DIAGNOSIS — Z7901 Long term (current) use of anticoagulants: Secondary | ICD-10-CM | POA: Diagnosis not present

## 2021-10-24 DIAGNOSIS — I700 Atherosclerosis of aorta: Secondary | ICD-10-CM | POA: Insufficient documentation

## 2021-10-24 DIAGNOSIS — M4316 Spondylolisthesis, lumbar region: Secondary | ICD-10-CM | POA: Insufficient documentation

## 2021-10-24 DIAGNOSIS — M47814 Spondylosis without myelopathy or radiculopathy, thoracic region: Secondary | ICD-10-CM | POA: Diagnosis not present

## 2021-10-24 DIAGNOSIS — Z7984 Long term (current) use of oral hypoglycemic drugs: Secondary | ICD-10-CM | POA: Insufficient documentation

## 2021-10-24 DIAGNOSIS — M4804 Spinal stenosis, thoracic region: Secondary | ICD-10-CM | POA: Diagnosis not present

## 2021-10-24 DIAGNOSIS — I509 Heart failure, unspecified: Secondary | ICD-10-CM | POA: Insufficient documentation

## 2021-10-24 DIAGNOSIS — N186 End stage renal disease: Secondary | ICD-10-CM

## 2021-10-24 DIAGNOSIS — Z7189 Other specified counseling: Secondary | ICD-10-CM

## 2021-10-24 DIAGNOSIS — G893 Neoplasm related pain (acute) (chronic): Secondary | ICD-10-CM | POA: Insufficient documentation

## 2021-10-24 DIAGNOSIS — M5134 Other intervertebral disc degeneration, thoracic region: Secondary | ICD-10-CM | POA: Diagnosis not present

## 2021-10-24 DIAGNOSIS — C9000 Multiple myeloma not having achieved remission: Secondary | ICD-10-CM

## 2021-10-24 LAB — CBC WITH DIFFERENTIAL/PLATELET
Abs Immature Granulocytes: 0.09 10*3/uL — ABNORMAL HIGH (ref 0.00–0.07)
Basophils Absolute: 0 10*3/uL (ref 0.0–0.1)
Basophils Relative: 0 %
Eosinophils Absolute: 0.1 10*3/uL (ref 0.0–0.5)
Eosinophils Relative: 1 %
HCT: 28.9 % — ABNORMAL LOW (ref 36.0–46.0)
Hemoglobin: 9.4 g/dL — ABNORMAL LOW (ref 12.0–15.0)
Immature Granulocytes: 1 %
Lymphocytes Relative: 11 %
Lymphs Abs: 1 10*3/uL (ref 0.7–4.0)
MCH: 32.2 pg (ref 26.0–34.0)
MCHC: 32.5 g/dL (ref 30.0–36.0)
MCV: 99 fL (ref 80.0–100.0)
Monocytes Absolute: 0.8 10*3/uL (ref 0.1–1.0)
Monocytes Relative: 9 %
Neutro Abs: 6.8 10*3/uL (ref 1.7–7.7)
Neutrophils Relative %: 78 %
Platelets: 225 10*3/uL (ref 150–400)
RBC: 2.92 MIL/uL — ABNORMAL LOW (ref 3.87–5.11)
RDW: 16.5 % — ABNORMAL HIGH (ref 11.5–15.5)
WBC: 8.8 10*3/uL (ref 4.0–10.5)
nRBC: 0 % (ref 0.0–0.2)

## 2021-10-24 LAB — COMPREHENSIVE METABOLIC PANEL
ALT: 14 U/L (ref 0–44)
AST: 19 U/L (ref 15–41)
Albumin: 3.6 g/dL (ref 3.5–5.0)
Alkaline Phosphatase: 43 U/L (ref 38–126)
Anion gap: 15 (ref 5–15)
BUN: 65 mg/dL — ABNORMAL HIGH (ref 8–23)
CO2: 24 mmol/L (ref 22–32)
Calcium: 9.4 mg/dL (ref 8.9–10.3)
Chloride: 94 mmol/L — ABNORMAL LOW (ref 98–111)
Creatinine, Ser: 5.9 mg/dL (ref 0.44–1.00)
GFR, Estimated: 6 mL/min — ABNORMAL LOW (ref >60)
Glucose, Bld: 116 mg/dL — ABNORMAL HIGH (ref 70–99)
Potassium: 4.4 mmol/L (ref 3.5–5.1)
Sodium: 133 mmol/L — ABNORMAL LOW (ref 135–145)
Total Bilirubin: 0.3 mg/dL (ref 0.3–1.2)
Total Protein: 7.7 g/dL (ref 6.5–8.1)

## 2021-10-24 NOTE — Progress Notes (Signed)
[place] OFFICE PROGRESS NOTE  Patient Care Team: Lucianne Lei, MD as PCP - General (Family Medicine) Thompson Grayer, MD as PCP - Electrophysiology (Cardiology) Josue Hector, MD as PCP - Cardiology (Cardiology)  ASSESSMENT & PLAN:  Multiple myeloma not having achieved remission ([place]) I have reviewed recent blood work with the patient and family Her M protein was significantly elevated, from a baseline of 0.2 g to 1.4 g I have repeated another set of myeloma panel today and we will call her son next week with test results If confirm cancer relapse, given her frail status and significant multiple comorbidities, it is not clear to me if she could tolerate further chemotherapy We discussed the risk and benefits of chemotherapy versus palliative care For now, the patient would like to return home for further discussion with family I recommend increasing dexamethasone to 2 mg daily along with pain medicine for back pain  Cancer associated pain She was referred to pain clinic for pain management I recommend increasing the dose of pain medicine if allowed by the pain clinic for better management of her pain  ESRD (end stage renal disease) on dialysis [facility]) We discussed the limitations of treatment options due to her end-stage renal disease She will continue hemodialysis  Goals of care, counseling/discussion We had numerous goals of care discussions in the past The patient have recurrent hospitalization for many reasons She is frail with significant multiple comorbidities including congestive heart failure, end-stage renal disease on hemodialysis, chronic anemia and poor baseline performance status We discussed the role of palliative chemotherapy versus palliative care with hospice The patient is undecided Her prognosis overall is poor   No orders of the defined types were placed in this encounter.   All questions were answered. The patient knows to call the clinic  with any problems, questions or concerns. The total time spent in the appointment was 40 minutes encounter with patients including review of chart and various tests results, discussions about plan of care and coordination of care plan   Heath Lark, MD 10/24/2021 6:14 PM  INTERVAL HISTORY: Please see below for problem oriented charting. she returns for hospital follow-up She is here accompanied by her son and her daughter is available to discuss plan of care over the phone Since she was discharged, her pain is manageable with 6 oxycodone per day She has no pain if she does not move She denies nausea Her appetite is very poor although she is able to tolerate frequent small meals She spent most of the time in bed especially after her dialysis days On her nondialysis days, she still spent over 12 hours a day lying down or resting   REVIEW OF SYSTEMS:   Constitutional: Denies fevers, chills or abnormal weight loss Eyes: Denies blurriness of vision Ears, nose, mouth, throat, and face: Denies mucositis or sore throat Respiratory: Denies cough, dyspnea or wheezes Cardiovascular: Denies palpitation, chest discomfort or lower extremity swelling Gastrointestinal:  Denies nausea, heartburn or change in bowel habits Skin: Denies abnormal skin rashes Lymphatics: Denies new lymphadenopathy or easy bruising Behavioral/Psych: Mood is stable, no new changes  All other systems were reviewed with the patient and are negative.  I have reviewed the past medical history, past surgical history, social history and family history with the patient and they are unchanged from previous note.  ALLERGIES:  is allergic to aspirin, hydrocodone-acetaminophen, morphine, penicillins, betaine, dacarbazine, ketorolac tromethamine, brimonidine, diltiazem hcl, hydrocodone-acetaminophen, neurontin [gabapentin], and sulfacetamide sodium.  MEDICATIONS:  Current  Outpatient Medications  Medication Sig Dispense Refill    acetaminophen (TYLENOL) 500 MG tablet Take 1,000 mg by mouth 2 (two) times daily as needed for mild pain or moderate pain.      acyclovir (ZOVIRAX) 400 MG tablet Take 1 tablet (400 mg total) by mouth daily. 90 tablet 6   allopurinol (ZYLOPRIM) 100 MG tablet Take 0.5 tablets (50 mg total) by mouth daily. 30 tablet 0   B Complex-C-Zn-Folic Acid (DIALYVITE [id number] 15) 0.8 MG TABS Take 1 tablet by mouth daily.     cholecalciferol (VITAMIN D3) 25 MCG (1000 UT) tablet Take 2,000 Units by mouth daily.     citalopram (CELEXA) 10 MG tablet Take 10 mg by mouth daily.     cyclobenzaprine (FLEXERIL) 5 MG tablet Take 1 tablet (5 mg total) by mouth 3 (three) times daily as needed for muscle spasms. 20 tablet 0   dexamethasone (DECADRON) 2 MG tablet TAKE 1 TABLET BY MOUTH EVERY DAY (Patient taking differently: Take 2 mg by mouth daily.) 30 tablet 3   ELIQUIS 2.5 MG TABS tablet TAKE 1 TABLET BY MOUTH TWICE A DAY (Patient taking differently: Take 2.5 mg by mouth 2 (two) times daily.) 60 tablet 5   fluorometholone (FML) 0.1 % ophthalmic suspension Place 1 drop into the right eye daily as needed (eyes).     glimepiride (AMARYL) 1 MG tablet Take 0.5 mg by mouth daily.      lactulose (CHRONULAC) 10 GM/15ML solution TAKE 15 MLS (10 G TOTAL) BY MOUTH 3 (THREE) TIMES DAILY. (Patient taking differently: Take 10 g by mouth 3 (three) times daily.) 1419 mL 3   lidocaine (LIDODERM) 5 % Place 1 patch onto the skin daily.     lidocaine-prilocaine (EMLA) cream Apply 1 application topically as needed [facility]).      LYRICA 50 MG capsule TAKE 1 CAPSULE (50 MG TOTAL) BY MOUTH AT BEDTIME. 90 capsule 1   metoCLOPramide (REGLAN) 5 MG tablet TAKE 1 TABLET BY MOUTH 3 TIMES DAILY BEFORE MEALS (Patient taking differently: Take 5 mg by mouth 3 (three) times daily before meals.) 90 tablet 1   midodrine (PROAMATINE) 10 MG tablet Take 10 mg by mouth as directed. Low bp, and before dialysis     Multiple Vitamin (MULTIVITAMIN) tablet Take 1 tablet  by mouth daily.     Nutritional Supplements (FEEDING SUPPLEMENT, NEPRO CARB STEADY,) LIQD Take 237 mLs by mouth 2 (two) times daily between meals. 10000 mL 0   ondansetron (ZOFRAN) 4 MG tablet Take 1 tablet (4 mg total) by mouth every 8 (eight) hours as needed for nausea. 60 tablet 1   oxyCODONE (OXY IR/ROXICODONE) 5 MG immediate release tablet Take 1 tablet (5 mg total) by mouth every 4 (four) hours as needed for severe pain. 60 tablet 0   pantoprazole (PROTONIX) 40 MG tablet TAKE 1 TABLET (40 MG TOTAL) BY MOUTH DAILY AS NEEDED (ACID REFLUX/INDIGESTION.). 90 tablet 3   polyethylene glycol (MIRALAX / GLYCOLAX) 17 g packet Take 17 g by mouth daily.     senna-docusate (SENOKOT-S) 8.6-50 MG tablet Take 2 tablets by mouth 2 (two) times daily. 30 tablet 0   sevelamer carbonate (RENVELA) 800 MG tablet Take 800 mg by mouth in the morning, at noon, and at bedtime.     No current facility-administered medications for this visit.    SUMMARY OF ONCOLOGIC HISTORY: Oncology History  Multiple myeloma not having achieved remission ([place])  03/03/2019 Initial Diagnosis   Multiple myeloma not having achieved remission ([place])  03/03/2019 Imaging   1. Round and oval lucent/lytic lesions in the skull, bilateral humeri, distal left femur, and possibly also in the pelvis are compatible with multiple myeloma. No right lower extremity lytic lesion identified. No pathologic fracture. 2. Advanced degenerative changes in the spine with no vertebral myeloma evident by plain radiography. 3. Advanced degenerative changes in the right upper extremity. Bilateral knee arthroplasty. 4.  Aortic Atherosclerosis (ICD10-I70.0).   03/04/2019 -  Chemotherapy   The patient had bortezomib for chemotherapy treatment.    03/04/2019 Bone Marrow Biopsy   Bone Marrow, Aspirate,Biopsy, and Clot - HYPERCELLULAR BONE MARROW FOR AGE WITH PLASMACYTOSIS. - SEE COMMENT. PERIPHERAL BLOOD: - NORMOCYTIC-NORMOCHROMIC ANEMIA. Diagnosis Note The  bone marrow is hypercellular for age with increased number of atypical plasma cells averaging 30% of all cells in the aspirate associated with interstitial infiltrates and numerous variably sized aggregates in the clot and biopsy sections. 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 Confirmatory in situ hybridization for kappa and lambda as well as CD138 will be performed and the results reported in an addendum. The background shows trilineage hematopoiesis with mild non-specific changes primarily involving the erythroid series. Correlation with cytogenetic and FISH studies is recommended.    06/20/2019 -  Chemotherapy   The patient had dexamethasone (DECADRON) tablet 20 mg, 20 mg (100 % of original dose 20 mg), Oral,  Once, 19 of 21 cycles Dose modification: 20 mg (original dose 20 mg, Cycle 1), 8 mg (original dose 8 mg, Cycle 10) Administration: 20 mg (06/20/2019), 20 mg (06/27/2019), 20 mg (07/04/2019), 20 mg (07/11/2019), 20 mg (07/18/2019), 20 mg (07/25/2019), 20 mg (08/08/2019), 20 mg (08/15/2019), 20 mg (09/05/2019), 20 mg (12/01/2019), 20 mg (09/26/2019), 20 mg (10/20/2019), 20 mg (11/10/2019), 20 mg (12/22/2019), 20 mg (01/12/2020), 8 mg (02/02/2020), 8 mg (02/23/2020), 8 mg (03/22/2020), 8 mg (04/12/2020), 8 mg (05/03/2020), 8 mg (05/31/2020), 8 mg (06/23/2020), 8 mg (07/14/2020), 8 mg (08/04/2020), 8 mg (08/25/2020) daratumumab-hyaluronidase-fihj (DARZALEX FASPRO) 1800-30000 MG-UT/15ML chemo SQ injection 1,800 mg, 1,800 mg, Subcutaneous,  Once, 19 of 21 cycles Administration: 1,800 mg (06/20/2019), 1,800 mg (06/27/2019), 1,800 mg (07/04/2019), 1,800 mg (07/11/2019), 1,800 mg (07/18/2019), 1,800 mg (07/25/2019), 1,800 mg (08/08/2019), 1,800 mg (08/15/2019), 1,800 mg (09/05/2019), 1,800 mg (12/01/2019), 1,800 mg (02/02/2020), 1,800 mg (09/26/2019), 1,800 mg (10/20/2019), 1,800 mg (11/10/2019), 1,800 mg (12/22/2019), 1,800 mg (01/12/2020), 1,800 mg (02/23/2020), 1,800 mg (03/22/2020), 1,800 mg (04/12/2020), 1,800 mg (05/03/2020),  1,800 mg (05/31/2020), 1,800 mg (06/23/2020), 1,800 mg (07/14/2020), 1,800 mg (08/04/2020), 1,800 mg (08/25/2020)   for chemotherapy treatment.     01/19/2021 Cancer Staging   Staging form: Plasma Cell Myeloma and Plasma Cell Disorders, AJCC 8th Edition - Clinical stage from 01/19/2021: RISS Stage III (Beta-2-microglobulin (mg/L): 5.6, Albumin (g/dL): 3.2, ISS: Stage III, High-risk cytogenetics: Absent, LDH: Elevated) - Signed by Heath Lark, MD on 01/19/2021 Beta 2 microglobulin range (mg/L): Greater than or equal to 5.5 Albumin range (g/dL): Less than 3.5 Cytogenetics: Other mutation      PHYSICAL EXAMINATION: ECOG PERFORMANCE STATUS: 3 - Symptomatic, >50% confined to bed  Vitals:   10/24/21 1255  BP: (!) 104/52  Pulse: 71  Resp: 18  Temp: 97.8 F (36.6 C)  SpO2: 98%   Filed Weights   10/24/21 1255  Weight: 137 lb 6.4 oz (62.3 kg)    GENERAL:alert, no distress and comfortable; she falls asleep intermittently during the visit and appears very frail NEURO: alert & oriented x 3 with fluent speech, no focal motor/sensory deficits  LABORATORY  DATA:  I have reviewed the data as listed    Component Value Date/Time   NA 133 (L) 10/24/2021 1234   K 4.4 10/24/2021 1234   CL 94 (L) 10/24/2021 1234   CO2 24 10/24/2021 1234   GLUCOSE 116 (H) 10/24/2021 1234   BUN 65 (H) 10/24/2021 1234   CREATININE 5.90 (HH) 10/24/2021 1234   CREATININE 2.71 (H) 10/20/2019 1128   CREATININE 1.03 (H) 05/11/2016 0931   CALCIUM 9.4 10/24/2021 1234   PROT 7.7 10/24/2021 1234   ALBUMIN 3.6 10/24/2021 1234   AST 19 10/24/2021 1234   AST 18 10/20/2019 1128   ALT 14 10/24/2021 1234   ALT 10 10/20/2019 1128   ALKPHOS 43 10/24/2021 1234   BILITOT 0.3 10/24/2021 1234   BILITOT 0.4 10/20/2019 1128   GFRNONAA 6 (L) 10/24/2021 1234   GFRNONAA 15 (L) 10/20/2019 1128   GFRAA 14 (L) 07/14/2020 1021   GFRAA 18 (L) 10/20/2019 1128    No results found for: SPEP, UPEP  Lab Results  Component Value Date    WBC 8.8 10/24/2021   NEUTROABS 6.8 10/24/2021   HGB 9.4 (L) 10/24/2021   HCT 28.9 (L) 10/24/2021   MCV 99.0 10/24/2021   PLT 225 10/24/2021      Chemistry      Component Value Date/Time   NA 133 (L) 10/24/2021 1234   K 4.4 10/24/2021 1234   CL 94 (L) 10/24/2021 1234   CO2 24 10/24/2021 1234   BUN 65 (H) 10/24/2021 1234   CREATININE 5.90 (HH) 10/24/2021 1234   CREATININE 2.71 (H) 10/20/2019 1128   CREATININE 1.03 (H) 05/11/2016 0931      Component Value Date/Time   CALCIUM 9.4 10/24/2021 1234   ALKPHOS 43 10/24/2021 1234   AST 19 10/24/2021 1234   AST 18 10/20/2019 1128   ALT 14 10/24/2021 1234   ALT 10 10/20/2019 1128   BILITOT 0.3 10/24/2021 1234   BILITOT 0.4 10/20/2019 1128       RADIOGRAPHIC STUDIES: I have personally reviewed the radiological images as listed and agreed with the findings in the report. CT Thoracic Spine Wo Contrast  Result Date: 10/11/2021 CLINICAL DATA:  Multiple falls. Increased back pain. History of multiple myeloma. EXAM: CT THORACIC AND LUMBAR SPINE WITHOUT CONTRAST TECHNIQUE: Multidetector CT imaging of the thoracic and lumbar spine was performed without contrast. Multiplanar CT image reconstructions were also generated. COMPARISON:  CTA chest 09/10/2020. CT abdomen and pelvis 11/27/2019. FINDINGS: CT THORACIC SPINE FINDINGS Alignment: Unchanged grade 1 anterolisthesis of C7 on T1. Vertebrae: Numerous lytic lesions throughout the thoracic spine. Large destructive lesion involving the left aspect of the T1 vertebral body, left-sided T1 posterior elements, and adjacent left first rib with prominent extraosseous soft tissue component involving the left C7-T1 and T1-2 neural foramina, greatly progressed from the 09/10/2020 chest CTA. 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 New lesion involving the left lamina of T10 with cortical disruption and extraosseous tumor extension posteriorly. No acute fracture.  Paraspinal and other soft tissues: Aortic and coronary atherosclerosis. Calcified right hilar lymph nodes. Disc levels: Diffuse thoracic disc degeneration. Mild-to-moderate spinal stenosis at T11-12 due to circumferential disc bulging, endplate spurring, and severe right and moderate left facet arthrosis. CT LUMBAR SPINE FINDINGS Segmentation: 5 lumbar type vertebrae. Alignment: Moderate lumbar dextroscoliosis. Chronic grade 1 anterolisthesis of L4 on L5. Vertebrae: Numerous lytic lesions throughout the lumbar spine and included sacrum including a dominant 2.5 cm lesion in the L1  vertebral body, similar to the 11/27/2019 CT of the abdomen and pelvis. No acute fracture. Prior Ray cage fusion at L4-5. Paraspinal and other soft tissues: Bilateral renal atrophy. Abdominal aortic atherosclerosis. Disc levels: Advanced disc and facet degeneration from L2-3 to L5-S1. Prior laminectomies at L2-3, L3-4, and L4-5. Moderate spinal stenosis and severe left neural foraminal stenosis at L3-4 due to left eccentric disc bulging and posterior element hypertrophy. IMPRESSION: 1. Widespread lytic bone lesions consistent with multiple myeloma. 2. Large destructive lesion involving the left aspect of vertebral body and posterior elements of T1 and adjacent left first rib, greatly progressed from 09/10/2020. 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 3. No acute thoracic or lumbar spine fracture. 4. Advanced lumbar disc and facet degeneration with moderate spinal stenosis and severe left neural foraminal stenosis at L3-4. 5. Aortic Atherosclerosis (ICD10-I70.0). Electronically Signed   By: Logan Bores M.D.   On: 10/11/2021 17:20   CT Lumbar Spine Wo Contrast  Result Date: 10/11/2021 CLINICAL DATA:  Multiple falls. Increased back pain. History of multiple myeloma. EXAM: CT THORACIC AND LUMBAR SPINE WITHOUT CONTRAST TECHNIQUE: Multidetector CT imaging of the thoracic and lumbar spine was performed  without contrast. Multiplanar CT image reconstructions were also generated. COMPARISON:  CTA chest 09/10/2020. CT abdomen and pelvis 11/27/2019. FINDINGS: CT THORACIC SPINE FINDINGS Alignment: Unchanged grade 1 anterolisthesis of C7 on T1. Vertebrae: Numerous lytic lesions throughout the thoracic spine. Large destructive lesion involving the left aspect of the T1 vertebral body, left-sided T1 posterior elements, and adjacent left first rib with prominent extraosseous soft tissue component involving the left C7-T1 and T1-2 neural foramina, greatly progressed from the 09/10/2020 chest CTA. 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 New lesion involving the left lamina of T10 with cortical disruption and extraosseous tumor extension posteriorly. No acute fracture. Paraspinal and other soft tissues: Aortic and coronary atherosclerosis. Calcified right hilar lymph nodes. Disc levels: Diffuse thoracic disc degeneration. Mild-to-moderate spinal stenosis at T11-12 due to circumferential disc bulging, endplate spurring, and severe right and moderate left facet arthrosis. CT LUMBAR SPINE FINDINGS Segmentation: 5 lumbar type vertebrae. Alignment: Moderate lumbar dextroscoliosis. Chronic grade 1 anterolisthesis of L4 on L5. Vertebrae: Numerous lytic lesions throughout the lumbar spine and included sacrum including a dominant 2.5 cm lesion in the L1 vertebral body, similar to the 11/27/2019 CT of the abdomen and pelvis. No acute fracture. Prior Ray cage fusion at L4-5. Paraspinal and other soft tissues: Bilateral renal atrophy. Abdominal aortic atherosclerosis. Disc levels: Advanced disc and facet degeneration from L2-3 to L5-S1. Prior laminectomies at L2-3, L3-4, and L4-5. Moderate spinal stenosis and severe left neural foraminal stenosis at L3-4 due to left eccentric disc bulging and posterior element hypertrophy. IMPRESSION: 1. Widespread lytic bone lesions consistent with  multiple myeloma. 2. Large destructive lesion involving the left aspect of vertebral body and posterior elements of T1 and adjacent left first rib, greatly progressed from 09/10/2020. 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 3. No acute thoracic or lumbar spine fracture. 4. Advanced lumbar disc and facet degeneration with moderate spinal stenosis and severe left neural foraminal stenosis at L3-4. 5. Aortic Atherosclerosis (ICD10-I70.0). Electronically Signed   By: Logan Bores M.D.   On: 10/11/2021 17:20   DG Chest Port 1 View  Result Date: 10/11/2021 CLINICAL DATA:  Chest pain EXAM: PORTABLE CHEST 1 VIEW COMPARISON:  01/12/2021 FINDINGS: Transverse diameter of heart is increased. There are  no signs of pulmonary edema or focal pulmonary consolidation. There is no pleural effusion or pneumothorax. Pacemaker battery is seen in the left infraclavicular region with tips of leads in right atrium and right ventricle. IMPRESSION: Cardiomegaly. There are no signs of pulmonary edema or focal pulmonary consolidation. Electronically Signed   By: Elmer Picker M.D.   On: 10/11/2021 13:09

## 2021-10-24 NOTE — Assessment & Plan Note (Signed)
We discussed the limitations of treatment options due to her end-stage renal disease She will continue hemodialysis

## 2021-10-24 NOTE — Assessment & Plan Note (Signed)
We had numerous goals of care discussions in the past The patient have recurrent hospitalization for many reasons She is frail with significant multiple comorbidities including congestive heart failure, end-stage renal disease on hemodialysis, chronic anemia and poor baseline performance status We discussed the role of palliative chemotherapy versus palliative care with hospice The patient is undecided Her prognosis overall is poor

## 2021-10-24 NOTE — Assessment & Plan Note (Signed)
I have reviewed recent blood work with the patient and family Her M protein was significantly elevated, from a baseline of 0.2 g to 1.4 g I have repeated another set of myeloma panel today and we will call her son next week with test results If confirm cancer relapse, given her frail status and significant multiple comorbidities, it is not clear to me if she could tolerate further chemotherapy We discussed the risk and benefits of chemotherapy versus palliative care For now, the patient would like to return home for further discussion with family I recommend increasing dexamethasone to 2 mg daily along with pain medicine for back pain

## 2021-10-24 NOTE — Assessment & Plan Note (Signed)
She was referred to pain clinic for pain management I recommend increasing the dose of pain medicine if allowed by the pain clinic for better management of her pain

## 2021-10-25 ENCOUNTER — Other Ambulatory Visit: Payer: Self-pay | Admitting: Hematology and Oncology

## 2021-10-25 DIAGNOSIS — N186 End stage renal disease: Secondary | ICD-10-CM | POA: Diagnosis not present

## 2021-10-25 DIAGNOSIS — N2581 Secondary hyperparathyroidism of renal origin: Secondary | ICD-10-CM | POA: Diagnosis not present

## 2021-10-25 DIAGNOSIS — Z992 Dependence on renal dialysis: Secondary | ICD-10-CM | POA: Diagnosis not present

## 2021-10-25 LAB — KAPPA/LAMBDA LIGHT CHAINS
Kappa free light chain: 927.3 mg/L — ABNORMAL HIGH (ref 3.3–19.4)
Kappa, lambda light chain ratio: 54.23 — ABNORMAL HIGH (ref 0.26–1.65)
Lambda free light chains: 17.1 mg/L (ref 5.7–26.3)

## 2021-10-26 ENCOUNTER — Encounter: Payer: Self-pay | Admitting: Hematology and Oncology

## 2021-10-26 ENCOUNTER — Other Ambulatory Visit: Payer: Self-pay

## 2021-10-26 ENCOUNTER — Inpatient Hospital Stay (HOSPITAL_COMMUNITY)
Admission: EM | Admit: 2021-10-26 | Discharge: 2021-10-31 | DRG: 091 | Disposition: A | Discharge status: Hospice | Payer: Medicare PPO | Attending: Internal Medicine | Admitting: Internal Medicine

## 2021-10-26 ENCOUNTER — Other Ambulatory Visit (HOSPITAL_COMMUNITY): Payer: Self-pay | Admitting: Physician Assistant

## 2021-10-26 ENCOUNTER — Other Ambulatory Visit: Payer: Self-pay | Admitting: Hematology and Oncology

## 2021-10-26 ENCOUNTER — Telehealth: Payer: Self-pay

## 2021-10-26 DIAGNOSIS — Z880 Allergy status to penicillin: Secondary | ICD-10-CM

## 2021-10-26 DIAGNOSIS — R400 Somnolence: Secondary | ICD-10-CM | POA: Diagnosis present

## 2021-10-26 DIAGNOSIS — Z882 Allergy status to sulfonamides status: Secondary | ICD-10-CM

## 2021-10-26 DIAGNOSIS — K219 Gastro-esophageal reflux disease without esophagitis: Secondary | ICD-10-CM | POA: Diagnosis present

## 2021-10-26 DIAGNOSIS — G894 Chronic pain syndrome: Secondary | ICD-10-CM | POA: Diagnosis present

## 2021-10-26 DIAGNOSIS — M47819 Spondylosis without myelopathy or radiculopathy, site unspecified: Secondary | ICD-10-CM | POA: Diagnosis present

## 2021-10-26 DIAGNOSIS — G9340 Encephalopathy, unspecified: Secondary | ICD-10-CM | POA: Diagnosis not present

## 2021-10-26 DIAGNOSIS — Z885 Allergy status to narcotic agent status: Secondary | ICD-10-CM

## 2021-10-26 DIAGNOSIS — R638 Other symptoms and signs concerning food and fluid intake: Secondary | ICD-10-CM | POA: Diagnosis not present

## 2021-10-26 DIAGNOSIS — R4182 Altered mental status, unspecified: Secondary | ICD-10-CM | POA: Diagnosis not present

## 2021-10-26 DIAGNOSIS — H409 Unspecified glaucoma: Secondary | ICD-10-CM | POA: Diagnosis present

## 2021-10-26 DIAGNOSIS — Z66 Do not resuscitate: Secondary | ICD-10-CM | POA: Diagnosis not present

## 2021-10-26 DIAGNOSIS — I120 Hypertensive chronic kidney disease with stage 5 chronic kidney disease or end stage renal disease: Secondary | ICD-10-CM | POA: Diagnosis present

## 2021-10-26 DIAGNOSIS — Z886 Allergy status to analgesic agent status: Secondary | ICD-10-CM

## 2021-10-26 DIAGNOSIS — Z711 Person with feared health complaint in whom no diagnosis is made: Secondary | ICD-10-CM | POA: Diagnosis not present

## 2021-10-26 DIAGNOSIS — G928 Other toxic encephalopathy: Secondary | ICD-10-CM | POA: Diagnosis present

## 2021-10-26 DIAGNOSIS — E11649 Type 2 diabetes mellitus with hypoglycemia without coma: Secondary | ICD-10-CM | POA: Diagnosis not present

## 2021-10-26 DIAGNOSIS — S299XXA Unspecified injury of thorax, initial encounter: Secondary | ICD-10-CM | POA: Diagnosis not present

## 2021-10-26 DIAGNOSIS — M545 Low back pain, unspecified: Secondary | ICD-10-CM | POA: Diagnosis not present

## 2021-10-26 DIAGNOSIS — T380X5A Adverse effect of glucocorticoids and synthetic analogues, initial encounter: Secondary | ICD-10-CM | POA: Diagnosis present

## 2021-10-26 DIAGNOSIS — N186 End stage renal disease: Secondary | ICD-10-CM | POA: Diagnosis present

## 2021-10-26 DIAGNOSIS — I959 Hypotension, unspecified: Secondary | ICD-10-CM | POA: Diagnosis present

## 2021-10-26 DIAGNOSIS — E1122 Type 2 diabetes mellitus with diabetic chronic kidney disease: Secondary | ICD-10-CM | POA: Diagnosis present

## 2021-10-26 DIAGNOSIS — I700 Atherosclerosis of aorta: Secondary | ICD-10-CM | POA: Diagnosis not present

## 2021-10-26 DIAGNOSIS — Z515 Encounter for palliative care: Secondary | ICD-10-CM | POA: Diagnosis not present

## 2021-10-26 DIAGNOSIS — N261 Atrophy of kidney (terminal): Secondary | ICD-10-CM | POA: Diagnosis not present

## 2021-10-26 DIAGNOSIS — E1152 Type 2 diabetes mellitus with diabetic peripheral angiopathy with gangrene: Secondary | ICD-10-CM | POA: Diagnosis present

## 2021-10-26 DIAGNOSIS — L8990 Pressure ulcer of unspecified site, unspecified stage: Secondary | ICD-10-CM | POA: Diagnosis present

## 2021-10-26 DIAGNOSIS — M48061 Spinal stenosis, lumbar region without neurogenic claudication: Secondary | ICD-10-CM | POA: Diagnosis present

## 2021-10-26 DIAGNOSIS — K8020 Calculus of gallbladder without cholecystitis without obstruction: Secondary | ICD-10-CM | POA: Diagnosis not present

## 2021-10-26 DIAGNOSIS — S31000A Unspecified open wound of lower back and pelvis without penetration into retroperitoneum, initial encounter: Secondary | ICD-10-CM

## 2021-10-26 DIAGNOSIS — L89152 Pressure ulcer of sacral region, stage 2: Secondary | ICD-10-CM | POA: Diagnosis present

## 2021-10-26 DIAGNOSIS — M109 Gout, unspecified: Secondary | ICD-10-CM | POA: Diagnosis present

## 2021-10-26 DIAGNOSIS — Z20822 Contact with and (suspected) exposure to covid-19: Secondary | ICD-10-CM | POA: Diagnosis present

## 2021-10-26 DIAGNOSIS — K5900 Constipation, unspecified: Secondary | ICD-10-CM | POA: Diagnosis present

## 2021-10-26 DIAGNOSIS — S3991XA Unspecified injury of abdomen, initial encounter: Secondary | ICD-10-CM | POA: Diagnosis not present

## 2021-10-26 DIAGNOSIS — G4733 Obstructive sleep apnea (adult) (pediatric): Secondary | ICD-10-CM | POA: Diagnosis present

## 2021-10-26 DIAGNOSIS — I495 Sick sinus syndrome: Secondary | ICD-10-CM | POA: Diagnosis present

## 2021-10-26 DIAGNOSIS — Z992 Dependence on renal dialysis: Secondary | ICD-10-CM | POA: Diagnosis not present

## 2021-10-26 DIAGNOSIS — R410 Disorientation, unspecified: Secondary | ICD-10-CM | POA: Diagnosis not present

## 2021-10-26 DIAGNOSIS — Z950 Presence of cardiac pacemaker: Secondary | ICD-10-CM

## 2021-10-26 DIAGNOSIS — J9811 Atelectasis: Secondary | ICD-10-CM | POA: Diagnosis not present

## 2021-10-26 DIAGNOSIS — R5383 Other fatigue: Secondary | ICD-10-CM | POA: Diagnosis not present

## 2021-10-26 DIAGNOSIS — M1990 Unspecified osteoarthritis, unspecified site: Secondary | ICD-10-CM | POA: Diagnosis present

## 2021-10-26 DIAGNOSIS — I484 Atypical atrial flutter: Secondary | ICD-10-CM | POA: Diagnosis present

## 2021-10-26 DIAGNOSIS — C9000 Multiple myeloma not having achieved remission: Secondary | ICD-10-CM | POA: Diagnosis present

## 2021-10-26 DIAGNOSIS — G893 Neoplasm related pain (acute) (chronic): Secondary | ICD-10-CM | POA: Diagnosis not present

## 2021-10-26 DIAGNOSIS — R54 Age-related physical debility: Secondary | ICD-10-CM | POA: Diagnosis present

## 2021-10-26 DIAGNOSIS — I2510 Atherosclerotic heart disease of native coronary artery without angina pectoris: Secondary | ICD-10-CM | POA: Diagnosis not present

## 2021-10-26 DIAGNOSIS — L8940 Pressure ulcer of contiguous site of back, buttock and hip, unspecified stage: Secondary | ICD-10-CM | POA: Diagnosis not present

## 2021-10-26 DIAGNOSIS — Z8261 Family history of arthritis: Secondary | ICD-10-CM | POA: Diagnosis not present

## 2021-10-26 DIAGNOSIS — Z800 Family history of malignant neoplasm of digestive organs: Secondary | ICD-10-CM

## 2021-10-26 DIAGNOSIS — Z7189 Other specified counseling: Secondary | ICD-10-CM | POA: Diagnosis not present

## 2021-10-26 DIAGNOSIS — Z831 Family history of other infectious and parasitic diseases: Secondary | ICD-10-CM

## 2021-10-26 DIAGNOSIS — D539 Nutritional anemia, unspecified: Secondary | ICD-10-CM | POA: Diagnosis present

## 2021-10-26 DIAGNOSIS — Z79899 Other long term (current) drug therapy: Secondary | ICD-10-CM

## 2021-10-26 DIAGNOSIS — D631 Anemia in chronic kidney disease: Secondary | ICD-10-CM | POA: Diagnosis present

## 2021-10-26 DIAGNOSIS — Z8249 Family history of ischemic heart disease and other diseases of the circulatory system: Secondary | ICD-10-CM

## 2021-10-26 DIAGNOSIS — Z7401 Bed confinement status: Secondary | ICD-10-CM | POA: Diagnosis not present

## 2021-10-26 DIAGNOSIS — Z888 Allergy status to other drugs, medicaments and biological substances status: Secondary | ICD-10-CM

## 2021-10-26 DIAGNOSIS — T402X5A Adverse effect of other opioids, initial encounter: Secondary | ICD-10-CM | POA: Diagnosis present

## 2021-10-26 DIAGNOSIS — R110 Nausea: Secondary | ICD-10-CM | POA: Diagnosis not present

## 2021-10-26 DIAGNOSIS — Z803 Family history of malignant neoplasm of breast: Secondary | ICD-10-CM

## 2021-10-26 DIAGNOSIS — D72829 Elevated white blood cell count, unspecified: Secondary | ICD-10-CM | POA: Diagnosis present

## 2021-10-26 DIAGNOSIS — Z743 Need for continuous supervision: Secondary | ICD-10-CM | POA: Diagnosis not present

## 2021-10-26 DIAGNOSIS — Z7984 Long term (current) use of oral hypoglycemic drugs: Secondary | ICD-10-CM

## 2021-10-26 DIAGNOSIS — Z807 Family history of other malignant neoplasms of lymphoid, hematopoietic and related tissues: Secondary | ICD-10-CM

## 2021-10-26 DIAGNOSIS — Z789 Other specified health status: Secondary | ICD-10-CM | POA: Diagnosis not present

## 2021-10-26 DIAGNOSIS — M549 Dorsalgia, unspecified: Secondary | ICD-10-CM | POA: Diagnosis not present

## 2021-10-26 DIAGNOSIS — Z7901 Long term (current) use of anticoagulants: Secondary | ICD-10-CM

## 2021-10-26 MED ORDER — ONDANSETRON HCL 4 MG PO TABS: 4.0000 mg | ORAL_TABLET | Freq: Three times a day (TID) | ORAL | 2 refills | Status: AC | PRN

## 2021-10-26 MED ORDER — CYCLOBENZAPRINE HCL 5 MG PO TABS: 5.0000 mg | ORAL_TABLET | Freq: Three times a day (TID) | ORAL | 1 refills | Status: AC | PRN

## 2021-10-26 NOTE — ED Triage Notes (Signed)
Pt bib [facility] from home c/o back pain x1 day due to lesions that appeared today. Dialysis pt T, Th, S - last session yesterday. VSS w/ ems.

## 2021-10-26 NOTE — Telephone Encounter (Signed)
done

## 2021-10-26 NOTE — Telephone Encounter (Signed)
Prescription refill request for Eliquis received. Indication: afib  Last office visit: 5/6//2022, Allred Scr: 5.9, 10/24/2021 Age: 88 yo  Weight: 62.3 kg   Refill sent.

## 2021-10-26 NOTE — Telephone Encounter (Signed)
Returned call to son. Zofran Rx expired and needing a new Rx. Rx sent. Asking if you can refill Flexeril Rx filled by Dr. Linus Galas at recent hospital visit. She is having a lot of back spasms and this is really helping. Ask if you can send [facility] to [facility].

## 2021-10-26 NOTE — Telephone Encounter (Signed)
Called and given below message. Son verbalized understanding.

## 2021-10-27 ENCOUNTER — Observation Stay (HOSPITAL_COMMUNITY): Payer: Medicare PPO

## 2021-10-27 ENCOUNTER — Other Ambulatory Visit: Payer: Self-pay

## 2021-10-27 ENCOUNTER — Encounter (HOSPITAL_COMMUNITY): Payer: Self-pay | Admitting: Family Medicine

## 2021-10-27 ENCOUNTER — Emergency Department (HOSPITAL_COMMUNITY): Payer: Medicare PPO

## 2021-10-27 DIAGNOSIS — M545 Low back pain, unspecified: Secondary | ICD-10-CM

## 2021-10-27 DIAGNOSIS — Z992 Dependence on renal dialysis: Secondary | ICD-10-CM

## 2021-10-27 DIAGNOSIS — I484 Atypical atrial flutter: Secondary | ICD-10-CM | POA: Diagnosis not present

## 2021-10-27 DIAGNOSIS — N186 End stage renal disease: Secondary | ICD-10-CM

## 2021-10-27 DIAGNOSIS — L8940 Pressure ulcer of contiguous site of back, buttock and hip, unspecified stage: Secondary | ICD-10-CM

## 2021-10-27 DIAGNOSIS — C9000 Multiple myeloma not having achieved remission: Secondary | ICD-10-CM

## 2021-10-27 DIAGNOSIS — Z7189 Other specified counseling: Secondary | ICD-10-CM

## 2021-10-27 DIAGNOSIS — Z789 Other specified health status: Secondary | ICD-10-CM

## 2021-10-27 DIAGNOSIS — Z711 Person with feared health complaint in whom no diagnosis is made: Secondary | ICD-10-CM

## 2021-10-27 DIAGNOSIS — R638 Other symptoms and signs concerning food and fluid intake: Secondary | ICD-10-CM

## 2021-10-27 DIAGNOSIS — Z515 Encounter for palliative care: Secondary | ICD-10-CM

## 2021-10-27 DIAGNOSIS — G9340 Encephalopathy, unspecified: Secondary | ICD-10-CM

## 2021-10-27 DIAGNOSIS — G893 Neoplasm related pain (acute) (chronic): Secondary | ICD-10-CM

## 2021-10-27 DIAGNOSIS — R4182 Altered mental status, unspecified: Secondary | ICD-10-CM | POA: Diagnosis not present

## 2021-10-27 DIAGNOSIS — S31000A Unspecified open wound of lower back and pelvis without penetration into retroperitoneum, initial encounter: Secondary | ICD-10-CM

## 2021-10-27 LAB — CBC WITH DIFFERENTIAL/PLATELET
Abs Immature Granulocytes: 0.11 10*3/uL — ABNORMAL HIGH (ref 0.00–0.07)
Basophils Absolute: 0 10*3/uL (ref 0.0–0.1)
Basophils Relative: 0 %
Eosinophils Absolute: 0 10*3/uL (ref 0.0–0.5)
Eosinophils Relative: 0 %
HCT: 30.1 % — ABNORMAL LOW (ref 36.0–46.0)
Hemoglobin: 9.7 g/dL — ABNORMAL LOW (ref 12.0–15.0)
Immature Granulocytes: 1 %
Lymphocytes Relative: 6 %
Lymphs Abs: 1 10*3/uL (ref 0.7–4.0)
MCH: 32.6 pg (ref 26.0–34.0)
MCHC: 32.2 g/dL (ref 30.0–36.0)
MCV: 101 fL — ABNORMAL HIGH (ref 80.0–100.0)
Monocytes Absolute: 1.3 10*3/uL — ABNORMAL HIGH (ref 0.1–1.0)
Monocytes Relative: 8 %
Neutro Abs: 13.4 10*3/uL — ABNORMAL HIGH (ref 1.7–7.7)
Neutrophils Relative %: 85 %
Platelets: 199 10*3/uL (ref 150–400)
RBC: 2.98 MIL/uL — ABNORMAL LOW (ref 3.87–5.11)
RDW: 16.8 % — ABNORMAL HIGH (ref 11.5–15.5)
WBC: 15.8 10*3/uL — ABNORMAL HIGH (ref 4.0–10.5)
nRBC: 0 % (ref 0.0–0.2)

## 2021-10-27 LAB — COMPREHENSIVE METABOLIC PANEL
ALT: 19 U/L (ref 0–44)
AST: 29 U/L (ref 15–41)
Albumin: 3 g/dL — ABNORMAL LOW (ref 3.5–5.0)
Alkaline Phosphatase: 39 U/L (ref 38–126)
Anion gap: 16 — ABNORMAL HIGH (ref 5–15)
BUN: 74 mg/dL — ABNORMAL HIGH (ref 8–23)
CO2: 23 mmol/L (ref 22–32)
Calcium: 9.6 mg/dL (ref 8.9–10.3)
Chloride: 93 mmol/L — ABNORMAL LOW (ref 98–111)
Creatinine, Ser: 6.5 mg/dL — ABNORMAL HIGH (ref 0.44–1.00)
GFR, Estimated: 6 mL/min — ABNORMAL LOW (ref >60)
Glucose, Bld: 91 mg/dL (ref 70–99)
Potassium: 5.3 mmol/L — ABNORMAL HIGH (ref 3.5–5.1)
Sodium: 132 mmol/L — ABNORMAL LOW (ref 135–145)
Total Bilirubin: 0.6 mg/dL (ref 0.3–1.2)
Total Protein: 7.6 g/dL (ref 6.5–8.1)

## 2021-10-27 LAB — CBC
HCT: 27.6 % — ABNORMAL LOW (ref 36.0–46.0)
Hemoglobin: 8.9 g/dL — ABNORMAL LOW (ref 12.0–15.0)
MCH: 32.6 pg (ref 26.0–34.0)
MCHC: 32.2 g/dL (ref 30.0–36.0)
MCV: 101.1 fL — ABNORMAL HIGH (ref 80.0–100.0)
Platelets: 199 10*3/uL (ref 150–400)
RBC: 2.73 MIL/uL — ABNORMAL LOW (ref 3.87–5.11)
RDW: 16.6 % — ABNORMAL HIGH (ref 11.5–15.5)
WBC: 14.1 10*3/uL — ABNORMAL HIGH (ref 4.0–10.5)
nRBC: 0 % (ref 0.0–0.2)

## 2021-10-27 LAB — BASIC METABOLIC PANEL
Anion gap: 18 — ABNORMAL HIGH (ref 5–15)
BUN: 80 mg/dL — ABNORMAL HIGH (ref 8–23)
CO2: 22 mmol/L (ref 22–32)
Calcium: 9.7 mg/dL (ref 8.9–10.3)
Chloride: 92 mmol/L — ABNORMAL LOW (ref 98–111)
Creatinine, Ser: 6.61 mg/dL — ABNORMAL HIGH (ref 0.44–1.00)
GFR, Estimated: 6 mL/min — ABNORMAL LOW (ref >60)
Glucose, Bld: 88 mg/dL (ref 70–99)
Potassium: 5.8 mmol/L — ABNORMAL HIGH (ref 3.5–5.1)
Sodium: 132 mmol/L — ABNORMAL LOW (ref 135–145)

## 2021-10-27 LAB — MAGNESIUM: Magnesium: 2.8 mg/dL — ABNORMAL HIGH (ref 1.7–2.4)

## 2021-10-27 LAB — TSH: TSH: 1.682 u[IU]/mL (ref 0.350–4.500)

## 2021-10-27 LAB — RESP PANEL BY RT-PCR (FLU A&B, COVID) ARPGX2
Influenza A by PCR: NEGATIVE
Influenza B by PCR: NEGATIVE
SARS Coronavirus 2 by RT PCR: NEGATIVE

## 2021-10-27 LAB — GLUCOSE, CAPILLARY
Glucose-Capillary: 86 mg/dL (ref 70–99)
Glucose-Capillary: 89 mg/dL (ref 70–99)
Glucose-Capillary: 82 mg/dL (ref 70–99)

## 2021-10-27 LAB — HEPATITIS B SURFACE ANTIGEN: Hepatitis B Surface Ag: NONREACTIVE

## 2021-10-27 LAB — AMMONIA: Ammonia: 19 umol/L (ref 9–35)

## 2021-10-27 LAB — CBG MONITORING, ED: Glucose-Capillary: 83 mg/dL (ref 70–99)

## 2021-10-27 LAB — HEPATITIS B SURFACE ANTIBODY,QUALITATIVE: Hep B S Ab: NONREACTIVE

## 2021-10-27 LAB — HEMOGLOBIN A1C
Hgb A1c MFr Bld: 5.2 % (ref 4.8–5.6)
Mean Plasma Glucose: 102.54 mg/dL

## 2021-10-27 LAB — VITAMIN B12: Vitamin B-12: 533 pg/mL (ref 180–914)

## 2021-10-27 LAB — MRSA NEXT GEN BY PCR, NASAL: MRSA by PCR Next Gen: NOT DETECTED

## 2021-10-27 LAB — RPR: RPR Ser Ql: NONREACTIVE

## 2021-10-27 LAB — PHOSPHORUS: Phosphorus: 6.9 mg/dL — ABNORMAL HIGH (ref 2.5–4.6)

## 2021-10-27 MED ORDER — MIDODRINE HCL 5 MG PO TABS
10.0000 mg | ORAL_TABLET | ORAL | Status: DC
  Administered 2021-10-27: 10 mg via ORAL
  Filled 2021-10-27: qty 2

## 2021-10-27 MED ORDER — SEVELAMER CARBONATE 800 MG PO TABS
800.0000 mg | ORAL_TABLET | Freq: Three times a day (TID) | ORAL | Status: DC
  Administered 2021-10-27: 800 mg via ORAL
  Filled 2021-10-27 (×3): qty 1

## 2021-10-27 MED ORDER — METOCLOPRAMIDE HCL 5 MG PO TABS
5.0000 mg | ORAL_TABLET | Freq: Three times a day (TID) | ORAL | Status: DC
  Administered 2021-10-27 – 2021-10-28 (×3): 5 mg via ORAL
  Filled 2021-10-27 (×4): qty 1

## 2021-10-27 MED ORDER — HEPARIN SODIUM (PORCINE) 1000 UNIT/ML DIALYSIS: 2000.0000 [IU] | Freq: Once | INTRAMUSCULAR | Status: DC

## 2021-10-27 MED ORDER — APIXABAN 2.5 MG PO TABS
2.5000 mg | ORAL_TABLET | Freq: Two times a day (BID) | ORAL | Status: DC
  Administered 2021-10-27 (×2): 2.5 mg via ORAL
  Filled 2021-10-27 (×5): qty 1

## 2021-10-27 MED ORDER — INSULIN ASPART 100 UNIT/ML IJ SOLN: 0.0000 [IU] | Freq: Three times a day (TID) | INTRAMUSCULAR | Status: DC

## 2021-10-27 MED ORDER — ACYCLOVIR 400 MG PO TABS
400.0000 mg | ORAL_TABLET | Freq: Every day | ORAL | Status: DC
  Administered 2021-10-27: 400 mg via ORAL
  Filled 2021-10-27 (×2): qty 1

## 2021-10-27 MED ORDER — OXYCODONE HCL 5 MG PO TABS
5.0000 mg | ORAL_TABLET | Freq: Four times a day (QID) | ORAL | Status: DC | PRN
  Administered 2021-10-27: 5 mg via ORAL
  Filled 2021-10-27: qty 1

## 2021-10-27 MED ORDER — ACETAMINOPHEN 325 MG PO TABS: 650.0000 mg | ORAL_TABLET | Freq: Four times a day (QID) | ORAL | Status: DC | PRN

## 2021-10-27 MED ORDER — DEXAMETHASONE 2 MG PO TABS
2.0000 mg | ORAL_TABLET | Freq: Every day | ORAL | Status: DC
  Administered 2021-10-27 – 2021-10-28 (×2): 2 mg via ORAL
  Filled 2021-10-27 (×3): qty 1

## 2021-10-27 MED ORDER — ACETAMINOPHEN 650 MG RE SUPP: 650.0000 mg | Freq: Four times a day (QID) | RECTAL | Status: DC | PRN

## 2021-10-27 MED ORDER — FENTANYL CITRATE PF 50 MCG/ML IJ SOSY
50.0000 ug | PREFILLED_SYRINGE | Freq: Once | INTRAMUSCULAR | Status: AC
  Administered 2021-10-27: 50 ug via INTRAVENOUS
  Filled 2021-10-27 (×2): qty 1

## 2021-10-27 MED ORDER — LIDOCAINE 5 % EX PTCH
1.0000 | MEDICATED_PATCH | Freq: Every day | CUTANEOUS | Status: DC
  Administered 2021-10-27 – 2021-10-31 (×4): 1 via TRANSDERMAL
  Filled 2021-10-27 (×5): qty 1

## 2021-10-27 MED ORDER — ALLOPURINOL 100 MG PO TABS
50.0000 mg | ORAL_TABLET | Freq: Every day | ORAL | Status: DC
  Administered 2021-10-27: 50 mg via ORAL
  Filled 2021-10-27: qty 0.5
  Filled 2021-10-27: qty 1

## 2021-10-27 MED ORDER — PANTOPRAZOLE SODIUM 40 MG PO TBEC: 40.0000 mg | DELAYED_RELEASE_TABLET | Freq: Every day | ORAL | Status: DC | PRN

## 2021-10-27 MED ORDER — LACTULOSE 10 GM/15ML PO SOLN
10.0000 g | Freq: Three times a day (TID) | ORAL | Status: DC
  Administered 2021-10-27 (×2): 10 g via ORAL
  Filled 2021-10-27 (×5): qty 15

## 2021-10-27 MED ORDER — MORPHINE SULFATE (PF) 2 MG/ML IV SOLN
2.0000 mg | INTRAVENOUS | Status: DC | PRN
  Administered 2021-10-28 (×2): 2 mg via INTRAVENOUS
  Filled 2021-10-27 (×2): qty 1

## 2021-10-27 MED ORDER — POLYETHYLENE GLYCOL 3350 17 G PO PACK
17.0000 g | PACK | Freq: Every day | ORAL | Status: DC
  Administered 2021-10-27: 17 g via ORAL
  Filled 2021-10-27 (×2): qty 1

## 2021-10-27 MED ORDER — CHLORHEXIDINE GLUCONATE CLOTH 2 % EX PADS: 6.0000 | MEDICATED_PAD | Freq: Every day | CUTANEOUS | Status: DC

## 2021-10-27 MED ORDER — SENNOSIDES-DOCUSATE SODIUM 8.6-50 MG PO TABS
2.0000 | ORAL_TABLET | Freq: Two times a day (BID) | ORAL | Status: DC
  Administered 2021-10-27 (×2): 2 via ORAL
  Filled 2021-10-27 (×3): qty 2

## 2021-10-27 MED ORDER — FLUOROMETHOLONE 0.1 % OP SUSP: 1.0000 [drp] | Freq: Every day | OPHTHALMIC | Status: DC | PRN

## 2021-10-27 NOTE — Consult Note (Signed)
Consultation Note Date: 10/27/2021   Patient Name: [name]  DOB: 1929/11/07  MRN: [id number]  Age / Sex: 88 y.o., female  PCP: Lucianne Lei, MD Referring Physician: Vianne Bulls, MD  Reason for Consultation: Establishing goals of care, "Very elderly patient with history of multiple myeloma presented with confusion.  She is also on dialysis.  I think she is a candidate for hospice care.  We need goals of care.  She is still full code."  HPI/Patient Profile: 88 y.o. female  with past medical history of ESRD on hemodialysis, type 2 diabetes mellitus, OSA, sick sinus syndrome with pacer, multiple myeloma, and chronic cancer-related pain presented to ED on 10/26/21 from home with son's concerns that patient was experiencing several days of progressive confusion, increased back pain, and new sacral wound. Patient was  admitted on 10/26/2021 with acute encephalopathy, multiple myeloma not achieving remission, ESRD on HD, chronic pain, pressure injury. Patient is followed by [facility] who has seen patient in house and does not feel patient can tolerate any further chemotherapy due to her frail status - she recommends hospice care. Nephrology was also consulted and feels if patient is no longer able to tolerate chemotherapy, it is unlikely they will be able to continue dialysis.   Clinical Assessment and Goals of Care: I have reviewed medical records including EPIC notes, labs, and imaging. Noted Dr. Alvy Bimler will be meeting with patient and son later this afternoon. Attempted to coordinate visit with Dr. Alvy Bimler; however, unfortunately will not be able to meet at 4pm when she will be arriving. Plan to meet patient and son later this evening after they speak with Dr. Alvy Bimler.   5:15 PM Received report from primary RN - no acute concerns. Patient had recently been taken for dialysis and is not in room. RN  reports patient is confused and has poor PO intake.  Went to bedside - patient had already been transported to dialysis. No family present.  Called son/Jacques to discuss diagnosis, prognosis, GOC, EOL wishes, disposition, and options.  I introduced Palliative Medicine as specialized medical care for people living with serious illness. It focuses on providing relief from the symptoms and stress of a serious illness. The goal is to improve quality of life for both the patient and the family.  [name] tells me that he was able to speak with someone yesterday from [place] who "told them all about hospice." He states he and his siblings are agreeable to patient's transition into hospice/comfort care but they are planning to meet with Dr. Alvy Bimler again tomorrow at [place]. He is agreeable to in-person meeting with PMT at [place] tomorrow for continued [place]. Plan is for patient to receive dialysis this evening.  Emotional support and therapeutic listening provided as he reflects on how difficult the situation has been. PMT number provided and encouraged to call with questions/concerns.   Primary Decision Maker: NEXT OF KIN - patient's children    SUMMARY OF RECOMMENDATIONS   Patient was in dialysis upon my arrival  to room and no family present  Spoke with son via phone - he and patient's other children are all in agreement to pursue hospice care Son is requests to meet PMT in person tomorrow 1/13 at 4pm for continued [place] PMT will continue to follow and support holistically  Code Status/Advance Care Planning: Full code  Palliative Prophylaxis:  Aspiration, Bowel Regimen, Delirium Protocol, Frequent Pain Assessment, Oral Care, and Turn Reposition  Additional Recommendations (Limitations, Scope, Preferences): Full Scope Treatment  Psycho-social/Spiritual:  Desire for further Chaplaincy support:no Created space and opportunity for family to express thoughts and feelings regarding patient's  current medical situation.  Emotional support and therapeutic listening provided.  Prognosis:  Poor - <2 weeks once dialysis is stopped  Discharge Planning: To Be Determined      Primary Diagnoses: Present on Admission:  Acute encephalopathy  Atypical atrial flutter (HCC)  Cancer associated pain  Metastatic multiple myeloma to bone (HCC)  Pressure injury of skin  Obstructive sleep apnea   I have reviewed the medical record, interviewed the patient and family, and examined the patient. The following aspects are pertinent.  Past Medical History:  Diagnosis Date   Anemia    Arthritis    Benign paroxysmal positional vertigo 04/06/2010   CAROTID BRUIT 16/07/9603   Complication of anesthesia    took a long time to wake up with knee replacement   DIABETES MELLITUS, TYPE II 02/24/2008   ESRD (end stage renal disease) on dialysis ([place])    tues thurs sat [facility] kidney center sees [place] [facility]   GERD (gastroesophageal reflux disease)    Glaucoma    Gout    HYPERTENSION 02/24/2008   Multiple myeloma (HCC)    Obstructive sleep apnea    Pancytopenia (HCC)    Sick sinus syndrome ([place])    now with pacemaker   Wears dentures    Wears glasses    Social History   Socioeconomic History   Marital status: Married    Spouse name: Not on file   Number of children: 5   Years of education: Not on file   Highest education level: Not on file  Occupational History   Occupation: retired  Tobacco Use   Smoking status: Never   Smokeless tobacco: Never  Vaping Use   Vaping Use: Never used  Substance and Sexual Activity   Alcohol use: No   Drug use: No   Sexual activity: Not on file  Other Topics Concern   Not on file  Social History Narrative   Retired Marine scientist   Social Determinants of Radio broadcast assistant Strain: Not on file  Food Insecurity: Not on file  Transportation Needs: Not on file  Physical Activity: Not on file  Stress: Not on file  Social Connections: Not on  file   Family History  Problem Relation Age of Onset   Congestive Heart Failure Mother    Tuberculosis Mother    Multiple myeloma Mother    Bone cancer Father    Arthritis Father    Breast cancer Sister    Colon cancer Sister    Hypertension Sister    Dementia Sister    Alcohol abuse Son    Scheduled Meds:  acyclovir  400 mg Oral Daily   allopurinol  50 mg Oral Daily   apixaban  2.5 mg Oral BID   Chlorhexidine Gluconate Cloth  6 each Topical Q0600   dexamethasone  2 mg Oral Q breakfast   insulin aspart  0-6 Units Subcutaneous TID  WC   lactulose  10 g Oral TID   lidocaine  1 patch Transdermal Daily   metoCLOPramide  5 mg Oral TID AC   midodrine  10 mg Oral Q T,Th,Sa-HD   polyethylene glycol  17 g Oral Daily   senna-docusate  2 tablet Oral BID   sevelamer carbonate  800 mg Oral TID WC   Continuous Infusions: PRN Meds:.acetaminophen **OR** acetaminophen, fluorometholone, oxyCODONE, pantoprazole Medications Prior to Admission:  Prior to Admission medications   Medication Sig Start Date End Date Taking? Authorizing Provider  acetaminophen (TYLENOL) 500 MG tablet Take 1,000 mg by mouth 2 (two) times daily as needed for mild pain or moderate pain.    Yes [provider]  acyclovir (ZOVIRAX) 400 MG tablet Take 1 tablet (400 mg total) by mouth daily. 09/15/20  Yes Gorsuch, Ni, MD  allopurinol (ZYLOPRIM) 100 MG tablet Take 0.5 tablets (50 mg total) by mouth daily. 03/12/19  Yes Nita Sells, MD  apixaban (ELIQUIS) 2.5 MG TABS tablet TAKE 1 TABLET TWICE DAILY Patient taking differently: Take 2.5 mg by mouth 2 (two) times daily. 10/26/21  Yes Allred, Jeneen Rinks, MD  B Complex-C-Zn-Folic Acid (DIALYVITE 086-VHQI 15) 0.8 MG TABS Take 1 tablet by mouth daily. 07/05/21  Yes [provider]  cholecalciferol (VITAMIN D3) 25 MCG (1000 UT) tablet Take 2,000 Units by mouth daily.   Yes [provider]  citalopram (CELEXA) 10 MG tablet Take 10 mg by mouth daily.   Yes  [provider]  cyclobenzaprine (FLEXERIL) 5 MG tablet Take 1 tablet (5 mg total) by mouth 3 (three) times daily as needed for muscle spasms. 10/26/21  Yes Gorsuch, Ni, MD  dexamethasone (DECADRON) 2 MG tablet TAKE 1 TABLET BY MOUTH EVERY DAY Patient taking differently: Take 2 mg by mouth daily. 08/15/21  Yes Gorsuch, Ni, MD  fluorometholone (FML) 0.1 % ophthalmic suspension Place 1 drop into the right eye daily as needed (eyes). 02/01/19  Yes [provider]  glimepiride (AMARYL) 1 MG tablet Take 0.5 mg by mouth daily.  04/07/20  Yes [provider]  lactulose (CHRONULAC) 10 GM/15ML solution TAKE 15 MLS (10 G TOTAL) BY MOUTH 3 (THREE) TIMES DAILY. Patient taking differently: Take 10 g by mouth 3 (three) times daily. 09/28/21  Yes Gorsuch, Ni, MD  lidocaine (LIDODERM) 5 % Place 1 patch onto the skin daily. 09/26/21  Yes [provider]  lidocaine-prilocaine (EMLA) cream Apply 1 application topically as needed [facility]).  05/26/20  Yes [provider]  LYRICA 50 MG capsule TAKE 1 CAPSULE (50 MG TOTAL) BY MOUTH AT BEDTIME. 09/05/21  Yes Gorsuch, Ni, MD  metoCLOPramide (REGLAN) 5 MG tablet TAKE 1 TABLET BY MOUTH 3 TIMES DAILY BEFORE MEALS Patient taking differently: Take 5 mg by mouth 3 (three) times daily before meals. 06/22/21  Yes Gorsuch, Ni, MD  midodrine (PROAMATINE) 10 MG tablet Take 10 mg by mouth as directed. Low bp, and before dialysis 12/28/20  Yes [provider]  Multiple Vitamin (MULTIVITAMIN) tablet Take 1 tablet by mouth daily.   Yes [provider]  Nutritional Supplements (FEEDING SUPPLEMENT, NEPRO CARB STEADY,) LIQD Take 237 mLs by mouth 2 (two) times daily between meals. 01/08/21  Yes Lavina Hamman, MD  ondansetron (ZOFRAN) 4 MG tablet Take 1 tablet (4 mg total) by mouth every 8 (eight) hours as needed. for nausea 10/26/21  Yes Gorsuch, Ni, MD  oxyCODONE (OXY IR/ROXICODONE) 5 MG immediate release tablet Take 1 tablet (5 mg total)  by mouth  every 4 (four) hours as needed for severe pain. 03/17/21  Yes Gorsuch, Ni, MD  pantoprazole (PROTONIX) 40 MG tablet TAKE 1 TABLET (40 MG TOTAL) BY MOUTH DAILY AS NEEDED (ACID REFLUX/INDIGESTION.). 02/17/21  Yes Gorsuch, Ni, MD  polyethylene glycol (MIRALAX / GLYCOLAX) 17 g packet Take 17 g by mouth daily.   Yes [provider]  senna-docusate (SENOKOT-S) 8.6-50 MG tablet Take 2 tablets by mouth 2 (two) times daily. 03/11/19  Yes Nita Sells, MD  sevelamer carbonate (RENVELA) 800 MG tablet Take 800 mg by mouth in the morning, at noon, and at bedtime. 09/13/21  Yes [provider]   Allergies  Allergen Reactions   Aspirin Other (See Comments)    REACTION: STOMACH ISSUES WITH DOSE HIGHER THAN 81 MG  Other reaction(s): GI Upset (intolerance), Other (See Comments) REACTION: STOMACH ISSUES WITH DOSE HIGHER THAN 81 MG  REACTION: STOMACH ISSUES WITH DOSE HIGHER THAN 81 MG    Hydrocodone-Acetaminophen Nausea And Vomiting   Morphine Nausea And Vomiting   Penicillins Rash    Patient took injection and tablets, had a reaction. She has taken amoxicillin with no reaction Has patient had a PCN reaction causing immediate rash, facial/tongue/throat swelling, SOB or lightheadedness with hypotension: Yes Has patient had a PCN reaction causing severe rash involving mucus membranes or skin necrosis: Yes Has patient had a PCN reaction that required hospitalization No Has patient had a PCN reaction occurring within the last 10 years: No If all of the above answers are "NO", then m Patient took injection and tablets, had a reaction. She has taken amoxicillin with no reaction Patient took injection and tablets, had a reaction. She has taken amoxicillin with no reaction Has patient had a PCN reaction causing immediate rash, facial/tongue/throat swelling, SOB or lightheadedness with hypotension: Yes Has patient had a PCN reaction causing severe rash involving mucus membranes or skin  necrosis: Yes Has patient had a PCN reaction that required hospitalization No Has patient had a PCN reaction occurring within the last 10 years: No If all of the above answers are "NO", then m   Betaine Nausea And Vomiting   Dacarbazine Nausea And Vomiting   Ketorolac Tromethamine Itching   Brimonidine Itching   Diltiazem Hcl Rash   Hydrocodone-Acetaminophen Nausea And Vomiting   Neurontin [Gabapentin] Nausea And Vomiting   Sulfacetamide Sodium Rash   Review of Systems  Unable to perform ROS: Other    Vital Signs: BP (!) 122/52 (BP Location: Left Arm)    Pulse 69    Temp 98.3 F (36.8 C) (Oral)    Resp 20    Ht _0  (1.651 m)    Wt 55.8 kg    SpO2 100%    BMI 20.47 kg/m  Pain Scale: 0-10   Pain Score: 10-Worst pain ever   SpO2: SpO2: 100 % O2 Device:SpO2: 100 % O2 Flow Rate: .   IO: Intake/output summary: No intake or output data in the 24 hours ending 10/27/21 1545  LBM:   Baseline Weight: Weight: 62 kg Most recent weight: Weight: 55.8 kg     Palliative Assessment/Data: PPS 20%     Time In/Out: 1520-1545/1715-1745 Time Total: 55 minutes  Greater than 50%  of this time was spent counseling and coordinating care related to the above assessment and plan.  Signed by: Lin Landsman, NP   Please contact Palliative Medicine Team phone at [phone] for questions and concerns.  For individual provider: See Shea Evans

## 2021-10-27 NOTE — Progress Notes (Addendum)
HEMATOLOGY-ONCOLOGY PROGRESS NOTE  I have seen her, examined her and agreed with documentation as follows  ASSESSMENT AND PLAN: Multiple myeloma not having achieved remission ([place]) I have reviewed recent blood work with the patient and family Her M protein was significantly elevated, from a baseline of 0.2 g to 1.4 g in about a 45-monthperiod of time; repeat myeloma panel from 10/24/2021 is still pending However, light chains drawn 10/24/2021 show an increase in the kappa free light chain from 381-927 and increase in the kappa, lambda light chain ratio from 24.93-54.23 within a 2-week period time. These results were discussed with the patient's son and the patient Today, we had additional discussion that given her frail status and significant multiple comorbidities, it clear to me if she would NOT be able tolerate further chemotherapy Her son recognizes that she has continued to decline and is now contemplating focusing on comfort The patient wants more treatment and I explained to her this is unrealistic For now, continue dexamethasone and oxycodone I plan to return tomorrow around 5 pm for further discussion about GOC   Cancer associated pain She was referred to pain clinic for pain management Continue dexamethasone and oxycodone   ESRD (end stage renal disease) on dialysis ([facility] We discussed the limitations of treatment options due to her end-stage renal disease Nephrology to see the patient to discuss ongoing dialysis versus stopping Son recognizes that she seems worse after dialysis  I recommend discontinuation of dialysis but since she is undecided, she will complete her dialysis today and we will discuss further tomorrow   Goals of care, counseling/discussion We had numerous goals of care discussions in the past The patient have recurrent hospitalization for many reasons She is frail with significant multiple comorbidities including congestive heart failure, end-stage renal disease on  hemodialysis, chronic anemia and poor baseline performance status This was discussed again today with the patient's son - Son seems open to considering shifting to comfort measures but would like to talk further with other family Her prognosis overall is poor Palliative care consult has been requested, I recommend hospice I will return tomorrow to review plan of care again  KMikey Bussing DNP, AGPCNP-BC, AOCNP NHeath Lark MD  SUBJECTIVE: Ms. PChatwinis followed by our office for multiple myeloma She was last seen in our office on 10/24/2021 and at that visit, there was an extensive discussion regarding goals of care Family wanted a few days to think about it but had not had an opportunity to call our office back to follow-up  Her son was at the bedside and he provided the history He brought her in due to progressive confusion, hypotension, and back/sacral pain Today, the patient is lying in bed reporting significant back pain -receiving oxycodone at the time my visit Son states that she has not done well following dialysis this past week and has been more somnolent and staring off into space at times He notices intermittent twitching since she started oxycodone Her appetite has been poor Overall, he notices that she has declined fairly significantly   Oncology History  [place] not [place] achieved remission ([place]  03/03/2019 Initial Diagnosis   [place]   03/03/2019 Imaging   1. Round and oval lucent/lytic lesions in the skull, bilateral humeri, distal left femur, and possibly also in the pelvis are compatible with multiple myeloma. No right lower extremity lytic lesion identified. No pathologic fracture. 2. Advanced degenerative changes in the spine with no vertebral myeloma  evident by plain radiography. 3. Advanced degenerative changes in the right upper extremity. Bilateral knee arthroplasty. 4.  Aortic Atherosclerosis  (ICD10-I70.0).   03/04/2019 -  Chemotherapy   The patient had bortezomib for chemotherapy treatment.    03/04/2019 Bone Marrow Biopsy   Bone Marrow, Aspirate,Biopsy, and Clot - HYPERCELLULAR BONE MARROW FOR AGE WITH PLASMACYTOSIS. - SEE COMMENT. PERIPHERAL BLOOD: - NORMOCYTIC-NORMOCHROMIC ANEMIA. Diagnosis Note The bone marrow is hypercellular for age with increased number of atypical plasma cells averaging 30% of all cells in the aspirate associated with interstitial infiltrates and numerous variably sized aggregates in the clot and biopsy sections. 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 Confirmatory in situ hybridization for kappa and lambda as well as CD138 will be performed and the results reported in an addendum. The background shows trilineage hematopoiesis with mild non-specific changes primarily involving the erythroid series. Correlation with cytogenetic and FISH studies is recommended.    06/20/2019 -  Chemotherapy   The patient had dexamethasone (DECADRON) tablet 20 mg, 20 mg (100 % of original dose 20 mg), Oral,  Once, 19 of 21 cycles Dose modification: 20 mg (original dose 20 mg, Cycle 1), 8 mg (original dose 8 mg, Cycle 10) Administration: 20 mg (06/20/2019), 20 mg (06/27/2019), 20 mg (07/04/2019), 20 mg (07/11/2019), 20 mg (07/18/2019), 20 mg (07/25/2019), 20 mg (08/08/2019), 20 mg (08/15/2019), 20 mg (09/05/2019), 20 mg (12/01/2019), 20 mg (09/26/2019), 20 mg (10/20/2019), 20 mg (11/10/2019), 20 mg (12/22/2019), 20 mg (01/12/2020), 8 mg (02/02/2020), 8 mg (02/23/2020), 8 mg (03/22/2020), 8 mg (04/12/2020), 8 mg (05/03/2020), 8 mg (05/31/2020), 8 mg (06/23/2020), 8 mg (07/14/2020), 8 mg (08/04/2020), 8 mg (08/25/2020) daratumumab-hyaluronidase-fihj (DARZALEX FASPRO) 1800-30000 MG-UT/15ML chemo SQ injection 1,800 mg, 1,800 mg, Subcutaneous,  Once, 19 of 21 cycles Administration: 1,800 mg (06/20/2019), 1,800 mg (06/27/2019), 1,800 mg (07/04/2019), 1,800 mg (07/11/2019), 1,800 mg (07/18/2019), 1,800  mg (07/25/2019), 1,800 mg (08/08/2019), 1,800 mg (08/15/2019), 1,800 mg (09/05/2019), 1,800 mg (12/01/2019), 1,800 mg (02/02/2020), 1,800 mg (09/26/2019), 1,800 mg (10/20/2019), 1,800 mg (11/10/2019), 1,800 mg (12/22/2019), 1,800 mg (01/12/2020), 1,800 mg (02/23/2020), 1,800 mg (03/22/2020), 1,800 mg (04/12/2020), 1,800 mg (05/03/2020), 1,800 mg (05/31/2020), 1,800 mg (06/23/2020), 1,800 mg (07/14/2020), 1,800 mg (08/04/2020), 1,800 mg (08/25/2020)   for chemotherapy treatment.     01/19/2021 Cancer Staging   Staging form: Plasma Cell Myeloma and Plasma Cell Disorders, AJCC 8th Edition - Clinical stage from 01/19/2021: RISS Stage III (Beta-2-microglobulin (mg/L): 5.6, Albumin (g/dL): 3.2, ISS: Stage III, High-risk cytogenetics: Absent, LDH: Elevated) - Signed by Heath Lark, MD on 01/19/2021 Beta 2 microglobulin range (mg/L): Greater than or equal to 5.5 Albumin range (g/dL): Less than 3.5 Cytogenetics: Other mutation       REVIEW OF SYSTEMS:   Review of Systems  Constitutional:  Positive for malaise/fatigue. Negative for fever.  Respiratory:  Negative for cough and shortness of breath.   Cardiovascular:  Negative for chest pain and leg swelling.  Gastrointestinal:  Negative for nausea and vomiting.  Musculoskeletal:  Positive for back pain.  Neurological:  Positive for tremors and weakness.       More sedated at times per family. Family also notices twitching since starting oxycodone and that she stares off at times.    I have reviewed the past medical history, past surgical history, social history and family history with the patient and they are unchanged from previous note.   PHYSICAL EXAMINATION: ECOG PERFORMANCE STATUS: 3 - Symptomatic, >50% confined to bed  Vitals:   10/27/21 0445 10/27/21  0700  BP: (!) 104/49 (!) 108/46  Pulse: 70 73  Resp: 16 13  Temp:    SpO2: 99% 99%   Filed Weights   10/26/21 2352  Weight: 62 kg    Intake/Output from previous day: No intake/output data  recorded.  Physical Exam Vitals reviewed.  Constitutional:      Comments: Alert and able to voice needs, but falls asleep quickly  HENT:     Mouth/Throat:     Mouth: Mucous membranes are moist.  Cardiovascular:     Rate and Rhythm: Normal rate and regular rhythm.  Pulmonary:     Effort: Pulmonary effort is normal.     Breath sounds: Normal breath sounds.  Abdominal:     General: Bowel sounds are normal.  Musculoskeletal:        General: No swelling.  Skin:    General: Skin is warm and dry.    LABORATORY DATA:  I have reviewed the data as listed CMP Latest Ref Rng & Units 10/27/2021 10/27/2021 10/24/2021  Glucose 70 - 99 mg/dL 88 91 116(H)  BUN 8 - 23 mg/dL 80(H) 74(H) 65(H)  Creatinine 0.44 - 1.00 mg/dL 6.61(H) 6.50(H) 5.90(HH)  Sodium 135 - 145 mmol/L 132(L) 132(L) 133(L)  Potassium 3.5 - 5.1 mmol/L 5.8(H) 5.3(H) 4.4  Chloride 98 - 111 mmol/L 92(L) 93(L) 94(L)  CO2 22 - 32 mmol/L _0 Calcium 8.9 - 10.3 mg/dL 9.7 9.6 9.4  Total Protein 6.5 - 8.1 g/dL - 7.6 7.7  Total Bilirubin 0.3 - 1.2 mg/dL - 0.6 0.3  Alkaline Phos 38 - 126 U/L - 39 43  AST 15 - 41 U/L - 29 19  ALT 0 - 44 U/L - 19 14    Lab Results  Component Value Date   WBC 14.1 (H) 10/27/2021   HGB 8.9 (L) 10/27/2021   HCT 27.6 (L) 10/27/2021   MCV 101.1 (H) 10/27/2021   PLT 199 10/27/2021   NEUTROABS 13.4 (H) 10/27/2021    No results found for: CEA1, CEA, ZOX096, CA125, PSA1  CT Head Wo Contrast  Result Date: 10/27/2021 CLINICAL DATA:  Mental status changes EXAM: CT HEAD WITHOUT CONTRAST TECHNIQUE: Contiguous axial images were obtained from the base of the skull through the vertex without intravenous contrast. RADIATION DOSE REDUCTION: This exam was performed according to the departmental dose-optimization program which includes automated exposure control, adjustment of the mA and/or kV according to patient size and/or use of iterative reconstruction technique. COMPARISON:  03/21/2020 FINDINGS: Brain:  There is atrophy and chronic small vessel disease changes. No acute intracranial abnormality. Specifically, no hemorrhage, hydrocephalus, mass lesion, acute infarction, or significant intracranial injury. Vascular: No hyperdense vessel or unexpected calcification. Skull: No acute calvarial abnormality. Sinuses/Orbits: No acute findings Other: None IMPRESSION: Atrophy, chronic microvascular disease. No acute intracranial abnormality. Electronically Signed   By: Rolm Baptise M.D.   On: 10/27/2021 03:38   CT Thoracic Spine Wo Contrast  Result Date: 10/11/2021 CLINICAL DATA:  Multiple falls. Increased back pain. History of multiple myeloma. EXAM: CT THORACIC AND LUMBAR SPINE WITHOUT CONTRAST TECHNIQUE: Multidetector CT imaging of the thoracic and lumbar spine was performed without contrast. Multiplanar CT image reconstructions were also generated. COMPARISON:  CTA chest 09/10/2020. CT abdomen and pelvis 11/27/2019. FINDINGS: CT THORACIC SPINE FINDINGS Alignment: Unchanged grade 1 anterolisthesis of C7 on T1. Vertebrae: Numerous lytic lesions throughout the thoracic spine. Large destructive lesion involving the left aspect of the T1 vertebral body, left-sided T1 posterior elements, and adjacent left first  rib with prominent extraosseous soft tissue component involving the left C7-T1 and T1-2 neural foramina, greatly progressed from the 09/10/2020 chest CTA. 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 New lesion involving the left lamina of T10 with cortical disruption and extraosseous tumor extension posteriorly. No acute fracture. Paraspinal and other soft tissues: Aortic and coronary atherosclerosis. Calcified right hilar lymph nodes. Disc levels: Diffuse thoracic disc degeneration. Mild-to-moderate spinal stenosis at T11-12 due to circumferential disc bulging, endplate spurring, and severe right and moderate left facet arthrosis. CT LUMBAR SPINE FINDINGS Segmentation:  5 lumbar type vertebrae. Alignment: Moderate lumbar dextroscoliosis. Chronic grade 1 anterolisthesis of L4 on L5. Vertebrae: Numerous lytic lesions throughout the lumbar spine and included sacrum including a dominant 2.5 cm lesion in the L1 vertebral body, similar to the 11/27/2019 CT of the abdomen and pelvis. No acute fracture. Prior Ray cage fusion at L4-5. Paraspinal and other soft tissues: Bilateral renal atrophy. Abdominal aortic atherosclerosis. Disc levels: Advanced disc and facet degeneration from L2-3 to L5-S1. Prior laminectomies at L2-3, L3-4, and L4-5. Moderate spinal stenosis and severe left neural foraminal stenosis at L3-4 due to left eccentric disc bulging and posterior element hypertrophy. IMPRESSION: 1. Widespread lytic bone lesions consistent with multiple myeloma. 2. Large destructive lesion involving the left aspect of vertebral body and posterior elements of T1 and adjacent left first rib, greatly progressed from 09/10/2020. 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 3. No acute thoracic or lumbar spine fracture. 4. Advanced lumbar disc and facet degeneration with moderate spinal stenosis and severe left neural foraminal stenosis at L3-4. 5. Aortic Atherosclerosis (ICD10-I70.0). Electronically Signed   By: Logan Bores M.D.   On: 10/11/2021 17:20   CT Lumbar Spine Wo Contrast  Result Date: 10/11/2021 CLINICAL DATA:  Multiple falls. Increased back pain. History of multiple myeloma. EXAM: CT THORACIC AND LUMBAR SPINE WITHOUT CONTRAST TECHNIQUE: Multidetector CT imaging of the thoracic and lumbar spine was performed without contrast. Multiplanar CT image reconstructions were also generated. COMPARISON:  CTA chest 09/10/2020. CT abdomen and pelvis 11/27/2019. FINDINGS: CT THORACIC SPINE FINDINGS Alignment: Unchanged grade 1 anterolisthesis of C7 on T1. Vertebrae: Numerous lytic lesions throughout the thoracic spine. Large destructive lesion involving the left  aspect of the T1 vertebral body, left-sided T1 posterior elements, and adjacent left first rib with prominent extraosseous soft tissue component involving the left C7-T1 and T1-2 neural foramina, greatly progressed from the 09/10/2020 chest CTA. 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 New lesion involving the left lamina of T10 with cortical disruption and extraosseous tumor extension posteriorly. No acute fracture. Paraspinal and other soft tissues: Aortic and coronary atherosclerosis. Calcified right hilar lymph nodes. Disc levels: Diffuse thoracic disc degeneration. Mild-to-moderate spinal stenosis at T11-12 due to circumferential disc bulging, endplate spurring, and severe right and moderate left facet arthrosis. CT LUMBAR SPINE FINDINGS Segmentation: 5 lumbar type vertebrae. Alignment: Moderate lumbar dextroscoliosis. Chronic grade 1 anterolisthesis of L4 on L5. Vertebrae: Numerous lytic lesions throughout the lumbar spine and included sacrum including a dominant 2.5 cm lesion in the L1 vertebral body, similar to the 11/27/2019 CT of the abdomen and pelvis. No acute fracture. Prior Ray cage fusion at L4-5. Paraspinal and other soft tissues: Bilateral renal atrophy. Abdominal aortic atherosclerosis. Disc levels: Advanced disc and facet degeneration from L2-3 to L5-S1. Prior laminectomies at L2-3, L3-4, and L4-5. Moderate spinal stenosis and severe left neural foraminal stenosis at L3-4  due to left eccentric disc bulging and posterior element hypertrophy. IMPRESSION: 1. Widespread lytic bone lesions consistent with multiple myeloma. 2. Large destructive lesion involving the left aspect of vertebral body and posterior elements of T1 and adjacent left first rib, greatly progressed from 09/10/2020. 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 3. No acute thoracic or lumbar spine fracture. 4. Advanced lumbar disc and facet degeneration  with moderate spinal stenosis and severe left neural foraminal stenosis at L3-4. 5. Aortic Atherosclerosis (ICD10-I70.0). Electronically Signed   By: Logan Bores M.D.   On: 10/11/2021 17:20   DG Chest Port 1 View  Result Date: 10/11/2021 CLINICAL DATA:  Chest pain EXAM: PORTABLE CHEST 1 VIEW COMPARISON:  01/12/2021 FINDINGS: Transverse diameter of heart is increased. There are no signs of pulmonary edema or focal pulmonary consolidation. There is no pleural effusion or pneumothorax. Pacemaker battery is seen in the left infraclavicular region with tips of leads in right atrium and right ventricle. IMPRESSION: Cardiomegaly. There are no signs of pulmonary edema or focal pulmonary consolidation. Electronically Signed   By: Elmer Picker M.D.   On: 10/11/2021 13:09   CT CHEST ABDOMEN PELVIS WO CONTRAST  Result Date: 10/27/2021 CLINICAL DATA:  Trauma. EXAM: CT CHEST, ABDOMEN AND PELVIS WITHOUT CONTRAST TECHNIQUE: Multidetector CT imaging of the chest, abdomen and pelvis was performed following the standard protocol without IV contrast. RADIATION DOSE REDUCTION: This exam was performed according to the departmental dose-optimization program which includes automated exposure control, adjustment of the mA and/or kV according to patient size and/or use of iterative reconstruction technique. COMPARISON:  Thoracic and lumbar spine CT dated 10/11/2021 and chest CT dated 09/10/2020. FINDINGS: Evaluation of this exam is limited in the absence of intravenous contrast. CT CHEST FINDINGS Cardiovascular: Moderate cardiomegaly. No pericardial effusion. Advanced 3 vessel coronary vascular calcification. There is severe atherosclerotic calcification of the thoracic aorta. No aneurysmal dilatation. Mild dilatation of the main pulmonary trunk suggestive of pulmonary hypertension. Mediastinum/Nodes: Right hilar calcified granuloma. No adenopathy. The esophagus is grossly unremarkable. No mediastinal fluid collection.  Lungs/Pleura: Bibasilar linear atelectasis/scarring. No lobar consolidation, pleural effusion, pneumothorax. The central airways are patent. Musculoskeletal: Left pectoral pacemaker device. Osteopenia with degenerative changes of the spine. There is a 3.7 x 3.9 cm paraspinal mass with infiltration of the left first and second ribs at the costovertebral junction as well as destructive changes of the T1 and probably C7 most consistent with malignancy or metastatic disease. There is severe degenerative changes of the shoulders. Fluid density adjacent to the left shoulder, likely effusion. Additional smaller scattered bone lucencies suspicious for metastatic disease. No definite acute osseous pathology. CT ABDOMEN PELVIS FINDINGS No intra-abdominal free air or free fluid. Hepatobiliary: The liver is unremarkable. No intrahepatic biliary dilatation. There is a stone within the gallbladder. No pericholecystic fluid or evidence of acute cholecystitis by CT. Pancreas: Unremarkable. No pancreatic ductal dilatation or surrounding inflammatory changes. Spleen: Normal in size without focal abnormality. Adrenals/Urinary Tract: The adrenal glands unremarkable. Moderate bilateral renal parenchyma atrophy. A 1 cm hypodense lesion from the lateral interpolar left kidney is not characterized on this CT but likely represents a cyst. There is no hydronephrosis or nephrolithiasis on either side. The visualized ureters and urinary bladder appear unremarkable. Stomach/Bowel: Large amount of stool throughout the colon. There is no bowel obstruction or active inflammation. Vascular/Lymphatic: Advanced aortoiliac atherosclerotic disease. The IVC is unremarkable. No portal venous gas. There is no adenopathy. Reproductive: Hysterectomy. Other: None Musculoskeletal: Severe osteopenia and  degenerative changes. Diffuse osseous lucencies suspicious for metastatic disease. L4-L5 disc spacer. No obvious acute fracture. IMPRESSION: 1. No acute  traumatic injury in the chest, abdomen, or pelvis. 2. Left paraspinal mass with infiltration of the left first and second ribs at the costovertebral junction as well as destructive changes of the T1 and probably C7 ribs most consistent with malignancy or metastatic disease. Diffuse osseous lucencies suspicious for metastatic disease. 3. Cholelithiasis. 4. Large amount of stool throughout the colon. No bowel obstruction. 5. Aortic Atherosclerosis (ICD10-I70.0). Electronically Signed   By: Anner Crete M.D.   On: 10/27/2021 01:02     Future Appointments  Date Time Provider [place]  11/04/2021  7:25 AM [facility] DEVICE REMOTES [facility]  11/21/2021  1:15 PM Gardiner Barefoot, DPM [facility]  02/03/2022  7:25 AM [facility] DEVICE REMOTES [facility]  05/05/2022  7:25 AM [facility] DEVICE REMOTES [facility]  08/04/2022  7:25 AM [facility] DEVICE REMOTES [facility]  11/03/2022  7:25 AM [facility] DEVICE REMOTES [facility]  02/02/2023  7:25 AM [facility] DEVICE REMOTES [facility]      LOS: 0 days

## 2021-10-27 NOTE — Progress Notes (Signed)
RT note: Patient states she only wears her CPAP at time at home. She states she does not wish to wear it at this time.

## 2021-10-27 NOTE — TOC Initial Note (Signed)
Oncology Discharge Planning Admission Note  [facility] Address: [address], [place], [place] [address] Hours of Operation:  8am - 5pm, Monday - Friday  Clinic Contact Information:  [phone]  Oncology Care Team: Medical Oncologist:  Dr Alvy Bimler  Myrtha Mantis, NP contacted Leonel Ramsay, at bedside to inform that the oncology provider Dr. Alvy Bimler is aware of this hospital admission dated 10/27/21, and the cancer center will follow [facility] inpatient care to assist with discharge planning as indicated by the oncologist.  We will reach out closer to discharge date to arrange follow up care.  Disclaimer:  This [facility] note does not imply a formal consult request has been made by the admitting attending for this admission or there will be an inpatient consult completed by oncology.  Please request oncology consults as per standard process as indicated.

## 2021-10-27 NOTE — Progress Notes (Addendum)
PROGRESS NOTE    [name]  MWN:[id number] DOB: 1930/06/13 DOA: 10/26/2021 PCP: Lucianne Lei, MD   Chief Complain: Confusion, back pain  Brief Narrative:  Patient is a 88 year old female with past medical history of metastatic multiple myeloma, ESRD on dialysis, type 2 diabetes, OSA, sick sinus syndrome with pacemaker, chronic cancer-related pain who presented to the emergency room with confusion, back pain.  Patient was brought by her son due to progressive confusion.  There is also report of new sacral wound.  On presentation she was confused but hemodynamically stable.  Nephrology consulted for dialysis.  Palliative care consult for goals of care because of her extremely poor prognosis and advanced age.  Oncology also following.  Assessment & Plan:   Principal Problem:   Acute encephalopathy Active Problems:   Obstructive sleep apnea   ESRD (end stage renal disease) on dialysis (HCC)   Cancer associated pain   Pressure injury of skin   Atypical atrial flutter (HCC)   Metastatic multiple myeloma to bone (HCC)   Acute encephalopathy: Unclear etiology.  Could be secondary to polypharmacy from pain medication, uremia.  Ammonia level normal.  She is not hypoxic or hypercarbic.  Remains confused. CT head did not show any acute intracranial abnormalities.  Metastatic multiple myeloma: Follows with Dr. Alvy Bimler.  Recent lab are concerning for relapse.  CT imaging done in the emergency department showed widespread lytic lesions, large destructive lesion involving T1 and C7 with paraspinal soft tissue mass.  Oncology not planning for continued chemotherapy due to her poor performance status. On dexamethasone  ESRD on dialysis: Nephrology consulted.  If she enrolls in hospice or comfort care, dialysis would be stopped.  Chronic pain syndrome: Avoid sedatives and opioids in the setting of confusion.  Continue supportive care.  History of atrial flutter: Currently rate is controlled.   On Eliquis.  Monitor on telemetry.  Status post pacemaker placement  Type 2 diabetes: Has poor oral intake.  Recent hemoglobin A1c of 4.6.  Was taking glimepiride at home which will be discontinued on discharge.  Monitor blood sugars  Normocytic anemia: Secondary to chronic disease, history of malignancy.  Hemoglobin in the range of 8-9  Leukocytosis: Likely from steroids.  Continue to monitor  Constipation: Continue bowel regimen  Goals of care: Extremely elderly patient, significantly deconditioned, now has metastatic multiple myeloma and also on dialysis.  She is still full code.  I think she will be candidate for hospice.  Palliative care consulted PT/OT consulted, recommending skilled nursing facility.                DVT prophylaxis:Eliquis Code Status: Full Family Communication: None at bedside Patient status:  Dispo: The patient is from: Home              Anticipated d/c is to: Not known at this time              Anticipated d/c date is: Not sure  Consultants: Oncology,Nephrology  Procedures:None  Antimicrobials:  Anti-infectives (From admission, onward)    Start     Dose/Rate Route Frequency Ordered Stop   10/27/21 1000  acyclovir (ZOVIRAX) tablet 400 mg        400 mg Oral Daily 10/27/21 0325         Subjective: Patient seen and examined the bedside this morning.  Hemodynamically stable but totally confused, not agitated.  Very deconditioned, weak, chronically looking, not in any Distress.  Complains of back pain  Objective: Vitals:   10/27/21  0900 10/27/21 1000 10/27/21 1100 10/27/21 1115  BP: (!) 112/48 (!) 124/54 (!) 129/59   Pulse: 73 74 73 73  Resp: 15 20 (!) 25 18  Temp:      TempSrc:      SpO2: 99% 100% 97% 96%  Weight:      Height:       No intake or output data in the 24 hours ending 10/27/21 1130 Filed Weights   10/26/21 2352  Weight: 62 kg    Examination:  General exam: Very deconditioned, chronically ill looking, weak HEENT:  PERRL Respiratory system:  no wheezes or crackles  Cardiovascular system: S1 & S2 heard, RRR.  Pacemaker Gastrointestinal system: Abdomen is distended, soft and nontender. Central nervous system: Alert and oriented Extremities: No edema, no clubbing ,no cyanosis, dialysis access on the right upper extremity Skin: No rashes, no ulcers,no icterus      Data Reviewed: I have personally reviewed following labs and imaging studies  CBC: Recent Labs  Lab 10/24/21 1234 10/27/21 0125 10/27/21 0616  WBC 8.8 15.8* 14.1*  NEUTROABS 6.8 13.4*  --   HGB 9.4* 9.7* 8.9*  HCT 28.9* 30.1* 27.6*  MCV 99.0 101.0* 101.1*  PLT 225 199 183   Basic Metabolic Panel: Recent Labs  Lab 10/24/21 1234 10/27/21 0125 10/27/21 0616  NA 133* 132* 132*  K 4.4 5.3* 5.8*  CL 94* 93* 92*  CO2 24 23 22   GLUCOSE 116* 91 88  BUN 65* 74* 80*  CREATININE 5.90* 6.50* 6.61*  CALCIUM 9.4 9.6 9.7  MG  --  2.8*  --    GFR: Estimated Creatinine Clearance: 5.2 mL/min (A) (by C-G formula based on SCr of 6.61 mg/dL (H)). Liver Function Tests: Recent Labs  Lab 10/24/21 1234 10/27/21 0125  AST 19 29  ALT 14 19  ALKPHOS 43 39  BILITOT 0.3 0.6  PROT 7.7 7.6  ALBUMIN 3.6 3.0*   No results for input(s): LIPASE, AMYLASE in the last 168 hours. Recent Labs  Lab 10/27/21 0612  AMMONIA 19   Coagulation Profile: No results for input(s): INR, PROTIME in the last 168 hours. Cardiac Enzymes: No results for input(s): CKTOTAL, CKMB, CKMBINDEX, TROPONINI in the last 168 hours. BNP (last 3 results) No results for input(s): PROBNP in the last 8760 hours. HbA1C: Recent Labs    10/27/21 0615  HGBA1C 5.2   CBG: Recent Labs  Lab 10/27/21 0815  GLUCAP 83   Lipid Profile: No results for input(s): CHOL, HDL, LDLCALC, TRIG, CHOLHDL, LDLDIRECT in the last 72 hours. Thyroid Function Tests: No results for input(s): TSH, T4TOTAL, FREET4, T3FREE, THYROIDAB in the last 72 hours. Anemia Panel: Recent Labs     10/27/21 0616  VITAMINB12 533   Sepsis Labs: No results for input(s): PROCALCITON, LATICACIDVEN in the last 168 hours.  Recent Results (from the past 240 hour(s))  Resp Panel by RT-PCR (Flu A&B, Covid) Nasopharyngeal Swab     Status: None   Collection Time: 10/27/21 12:33 AM   Specimen: Nasopharyngeal Swab; Nasopharyngeal(NP) swabs in vial transport medium  Result Value Ref Range Status   SARS Coronavirus 2 by RT PCR NEGATIVE NEGATIVE Final    Comment: (NOTE) SARS-CoV-2 target nucleic acids are NOT DETECTED.  The SARS-CoV-2 RNA is generally detectable in upper respiratory specimens during the acute phase of infection. The lowest concentration of SARS-CoV-2 viral copies this assay can detect is 138 copies/mL. A negative result does not preclude SARS-Cov-2 infection and should not be used as the sole basis  for treatment or other patient management decisions. A negative result may occur with  improper specimen collection/handling, submission of specimen other than nasopharyngeal swab, presence of viral mutation(s) within the areas targeted by this assay, and inadequate number of viral copies(<138 copies/mL). A negative result must be combined with clinical observations, patient history, and epidemiological information. The expected result is Negative.  Fact Sheet for Patients:  EntrepreneurPulse.com.au  Fact Sheet for Healthcare Providers:  IncredibleEmployment.be  This test is no t yet approved or cleared by the [place] FDA and  has been authorized for detection and/or diagnosis of SARS-CoV-2 by FDA under an Emergency Use Authorization (EUA). This EUA will remain  in effect (meaning this test can be used) for the duration of the COVID-19 declaration under Section 564(b)(1) of the Act, 21 U.S.C.section 360bbb-3(b)(1), unless the authorization is terminated  or revoked sooner.       Influenza A by PCR NEGATIVE NEGATIVE Final    Influenza B by PCR NEGATIVE NEGATIVE Final    Comment: (NOTE) The Xpert Xpress SARS-CoV-2/FLU/RSV plus assay is intended as an aid in the diagnosis of influenza from Nasopharyngeal swab specimens and should not be used as a sole basis for treatment. Nasal washings and aspirates are unacceptable for Xpert Xpress SARS-CoV-2/FLU/RSV testing.  Fact Sheet for Patients: EntrepreneurPulse.com.au  Fact Sheet for Healthcare Providers: IncredibleEmployment.be  This test is not yet approved or cleared by the [place] FDA and has been authorized for detection and/or diagnosis of SARS-CoV-2 by FDA under an Emergency Use Authorization (EUA). This EUA will remain in effect (meaning this test can be used) for the duration of the COVID-19 declaration under Section 564(b)(1) of the Act, 21 U.S.C. section 360bbb-3(b)(1), unless the authorization is terminated or revoked.  Performed at [facility], [place] [address] [place], [place] [address]          Radiology Studies: CT Head Wo Contrast  Result Date: 10/27/2021 CLINICAL DATA:  Mental status changes EXAM: CT HEAD WITHOUT CONTRAST TECHNIQUE: Contiguous axial images were obtained from the base of the skull through the vertex without intravenous contrast. RADIATION DOSE REDUCTION: This exam was performed according to the departmental dose-optimization program which includes automated exposure control, adjustment of the mA and/or kV according to patient size and/or use of iterative reconstruction technique. COMPARISON:  03/21/2020 FINDINGS: Brain: There is atrophy and chronic small vessel disease changes. No acute intracranial abnormality. Specifically, no hemorrhage, hydrocephalus, mass lesion, acute infarction, or significant intracranial injury. Vascular: No hyperdense vessel or unexpected calcification. Skull: No acute calvarial abnormality. Sinuses/Orbits: No acute findings Other: None IMPRESSION:  Atrophy, chronic microvascular disease. No acute intracranial abnormality. Electronically Signed   By: Rolm Baptise M.D.   On: 10/27/2021 03:38   CT CHEST ABDOMEN PELVIS WO CONTRAST  Result Date: 10/27/2021 CLINICAL DATA:  Trauma. EXAM: CT CHEST, ABDOMEN AND PELVIS WITHOUT CONTRAST TECHNIQUE: Multidetector CT imaging of the chest, abdomen and pelvis was performed following the standard protocol without IV contrast. RADIATION DOSE REDUCTION: This exam was performed according to the departmental dose-optimization program which includes automated exposure control, adjustment of the mA and/or kV according to patient size and/or use of iterative reconstruction technique. COMPARISON:  Thoracic and lumbar spine CT dated 10/11/2021 and chest CT dated 09/10/2020. FINDINGS: Evaluation of this exam is limited in the absence of intravenous contrast. CT CHEST FINDINGS Cardiovascular: Moderate cardiomegaly. No pericardial effusion. Advanced 3 vessel coronary vascular calcification. There is severe atherosclerotic calcification of the thoracic aorta. No aneurysmal dilatation. Mild dilatation  of the main pulmonary trunk suggestive of pulmonary hypertension. Mediastinum/Nodes: Right hilar calcified granuloma. No adenopathy. The esophagus is grossly unremarkable. No mediastinal fluid collection. Lungs/Pleura: Bibasilar linear atelectasis/scarring. No lobar consolidation, pleural effusion, pneumothorax. The central airways are patent. Musculoskeletal: Left pectoral pacemaker device. Osteopenia with degenerative changes of the spine. There is a 3.7 x 3.9 cm paraspinal mass with infiltration of the left first and second ribs at the costovertebral junction as well as destructive changes of the T1 and probably C7 most consistent with malignancy or metastatic disease. There is severe degenerative changes of the shoulders. Fluid density adjacent to the left shoulder, likely effusion. Additional smaller scattered bone lucencies  suspicious for metastatic disease. No definite acute osseous pathology. CT ABDOMEN PELVIS FINDINGS No intra-abdominal free air or free fluid. Hepatobiliary: The liver is unremarkable. No intrahepatic biliary dilatation. There is a stone within the gallbladder. No pericholecystic fluid or evidence of acute cholecystitis by CT. Pancreas: Unremarkable. No pancreatic ductal dilatation or surrounding inflammatory changes. Spleen: Normal in size without focal abnormality. Adrenals/Urinary Tract: The adrenal glands unremarkable. Moderate bilateral renal parenchyma atrophy. A 1 cm hypodense lesion from the lateral interpolar left kidney is not characterized on this CT but likely represents a cyst. There is no hydronephrosis or nephrolithiasis on either side. The visualized ureters and urinary bladder appear unremarkable. Stomach/Bowel: Large amount of stool throughout the colon. There is no bowel obstruction or active inflammation. Vascular/Lymphatic: Advanced aortoiliac atherosclerotic disease. The IVC is unremarkable. No portal venous gas. There is no adenopathy. Reproductive: Hysterectomy. Other: None Musculoskeletal: Severe osteopenia and degenerative changes. Diffuse osseous lucencies suspicious for metastatic disease. L4-L5 disc spacer. No obvious acute fracture. IMPRESSION: 1. No acute traumatic injury in the chest, abdomen, or pelvis. 2. Left paraspinal mass with infiltration of the left first and second ribs at the costovertebral junction as well as destructive changes of the T1 and probably C7 ribs most consistent with malignancy or metastatic disease. Diffuse osseous lucencies suspicious for metastatic disease. 3. Cholelithiasis. 4. Large amount of stool throughout the colon. No bowel obstruction. 5. Aortic Atherosclerosis (ICD10-I70.0). Electronically Signed   By: Anner Crete M.D.   On: 10/27/2021 01:02        Scheduled Meds:  acyclovir  400 mg Oral Daily   allopurinol  50 mg Oral Daily   apixaban   2.5 mg Oral BID   Chlorhexidine Gluconate Cloth  6 each Topical Q0600   dexamethasone  2 mg Oral Q breakfast   insulin aspart  0-6 Units Subcutaneous TID WC   lactulose  10 g Oral TID   lidocaine  1 patch Transdermal Daily   metoCLOPramide  5 mg Oral TID AC   midodrine  10 mg Oral Q T,Th,Sa-HD   polyethylene glycol  17 g Oral Daily   senna-docusate  2 tablet Oral BID   sevelamer carbonate  800 mg Oral TID WC   Continuous Infusions:   LOS: 0 days    Time spent: More than 50% of that time was spent in counseling and/or coordination of care.      Shelly Coss, MD [facility] P1/09/2022, 11:30 AM

## 2021-10-27 NOTE — Consult Note (Signed)
Renal Service Consult Note [facility]  [name] 10/27/2021 Sol Blazing, MD Requesting Physician: Dr. Myna Hidalgo  Reason for Consult: ESRD pt w/ AMS HPI: The patient is a 88 y.o. year-old w/ hx of multiple myeloma, ESRD on HD, DM2, DJD, gout, HTN , OSA, SSS sp PPM who presented to ED w/ family reporting confusions and somnolence, also increasing back pain. Also new sacral wound. She saw her oncologist Jan 9 and there was concern for myeloma relapse, also concerned that she may not be able to tolerate any further chemotherapy.  In ED VSS, BUN 74, K 5.3 , wbc 15K, Hb 9.7. CT body showed diffuse osseous lucencies, L paraspinal mass and destructive lesion involving T1 and C7. Pt admitted. Asked to see for ESRD.    Pt seen in room, the son / caretaker gives the history. Pt is alert but tired.  Son states that pt has been having confusion after HD when she is at home. Due to progressive back and hip pain, she has been requiring pain meds prior to dialysis. He is giving her 2 percocet around 6 am then another 2 after 9 before she leaves for HD.  The son denies any recent fevers, prod cough, dysuria or abd pain /n/v/d. Pt on HD since 2020 when her MM was diagnosed.   ROS - denies CP, no joint pain, no HA, no blurry vision, no rash, no diarrhea, no nausea/ vomiting   Past Medical History  Past Medical History:  Diagnosis Date   Anemia    Arthritis    Benign paroxysmal positional vertigo 04/06/2010   CAROTID BRUIT 46/28/6381   Complication of anesthesia    took a long time to wake up with knee replacement   DIABETES MELLITUS, TYPE II 02/24/2008   ESRD (end stage renal disease) on dialysis ([place])    tues thurs sat [facility] kidney center sees [place] [facility]   GERD (gastroesophageal reflux disease)    Glaucoma    Gout    HYPERTENSION 02/24/2008   Multiple myeloma ([place])    Obstructive sleep apnea    Pancytopenia ([place])    Sick sinus syndrome ([place])    now with pacemaker   Wears  dentures    Wears glasses    Past Surgical History  Past Surgical History:  Procedure Laterality Date   A/V FISTULAGRAM Right 03/08/2020   Procedure: A/V FISTULAGRAM- Right Arm;  Surgeon: Waynetta Sandy, MD;  Location: [place] [facility];  Service: Cardiovascular;  Laterality: Right;   A/V FISTULAGRAM N/A 06/28/2020   Procedure: A/V FISTULAGRAM - Right Upper Arm;  Surgeon: Waynetta Sandy, MD;  Location: [place] [facility];  Service: Cardiovascular;  Laterality: N/A;   ABDOMINAL HYSTERECTOMY     1980's   AV FISTULA PLACEMENT Right 03/11/2019   Procedure: CREATION RIGHT ARM RADIOCEPHALIC ARTERIOVENOUS FISTULA;  Surgeon: Angelia Mould, MD;  Location: [place];  Service: Vascular;  Laterality: Right;   AV FISTULA PLACEMENT Right 03/25/2020   Procedure: right arm ARTERIOVENOUS (AV) FISTULA CREATION;  Surgeon: Waynetta Sandy, MD;  Location: [place];  Service: Vascular;  Laterality: Right;   BACK SURGERY     x 3   BIOPSY  06/24/2019   Procedure: BIOPSY;  Surgeon: Ronald Lobo, MD;  Location: [facility];  Service: Endoscopy;;   BUBBLE STUDY  01/07/2021   Procedure: BUBBLE STUDY;  Surgeon: Sueanne Margarita, MD;  Location: [place];  Service: Cardiovascular;;   CARDIOVERSION N/A 01/07/2021   Procedure: CARDIOVERSION;  Surgeon: Sueanne Margarita, MD;  Location[facility]OS[facility];  Service: Cardiovascular;  Laterality: N/A;   EP IMPLANTABLE DEVICE N/A 05/16/2016   Procedure: PPM Generator Changeout;  Surgeon: Thompson Grayer, MD;  Location: [place] [facility];  Service: Cardiovascular;  Laterality: N/A;   ESOPHAGOGASTRODUODENOSCOPY (EGD) WITH PROPOFOL N/A 06/24/2019   Procedure: ESOPHAGOGASTRODUODENOSCOPY (EGD) WITH PROPOFOL;  Surgeon: Ronald Lobo, MD;  Location: [facility];  Service: Endoscopy;  Laterality: N/A;   FISTULA SUPERFICIALIZATION Right 03/11/2019   Procedure: FISTULA SUPERFICIALIZATION;  Surgeon: Angelia Mould, MD;  Location: [place];  Service:  Vascular;  Laterality: Right;   INSERTION OF DIALYSIS CATHETER Right 03/25/2020   Procedure: INSERTION OF DIALYSIS CATHETER, right internal jugular;  Surgeon: Waynetta Sandy, MD;  Location: [place];  Service: Vascular;  Laterality: Right;   IR FLUORO GUIDE CV LINE RIGHT  03/07/2019   IR US GUIDE VASC ACCESS RIGHT  03/07/2019   KNEE SURGERY Right    2003   KNEE SURGERY Left    2011   PACEMAKER INSERTION     PERIPHERAL VASCULAR BALLOON ANGIOPLASTY Right 06/28/2020   Procedure: PERIPHERAL VASCULAR BALLOON ANGIOPLASTY;  Surgeon: Waynetta Sandy, MD;  Location: [place] [facility];  Service: Cardiovascular;  Laterality: Right;   TEE WITHOUT CARDIOVERSION N/A 01/07/2021   Procedure: TRANSESOPHAGEAL ECHOCARDIOGRAM (TEE);  Surgeon: Sueanne Margarita, MD;  Location: [facility] ENDOSCOPY;  Service: Cardiovascular;  Laterality: N/A;   Family History  Family History  Problem Relation Age of Onset   Congestive Heart Failure Mother    Tuberculosis Mother    Multiple myeloma Mother    Bone cancer Father    Arthritis Father    Breast cancer Sister    Colon cancer Sister    Hypertension Sister    Dementia Sister    Alcohol abuse Son    Social History  reports that she has never smoked. She has never used smokeless tobacco. She reports that she does not drink alcohol and does not use drugs. Allergies  Allergies  Allergen Reactions   Aspirin Other (See Comments)    REACTION: STOMACH ISSUES WITH DOSE HIGHER THAN 81 MG  Other reaction(s): GI Upset (intolerance), Other (See Comments) REACTION: STOMACH ISSUES WITH DOSE HIGHER THAN 81 MG  REACTION: STOMACH ISSUES WITH DOSE HIGHER THAN 81 MG    Hydrocodone-Acetaminophen Nausea And Vomiting   Morphine Nausea And Vomiting   Penicillins Rash    Patient took injection and tablets, had a reaction. She has taken amoxicillin with no reaction Has patient had a PCN reaction causing immediate rash, facial/tongue/throat swelling, SOB or lightheadedness with  hypotension: Yes Has patient had a PCN reaction causing severe rash involving mucus membranes or skin necrosis: Yes Has patient had a PCN reaction that required hospitalization No Has patient had a PCN reaction occurring within the last 10 years: No If all of the above answers are "NO", then m Patient took injection and tablets, had a reaction. She has taken amoxicillin with no reaction Patient took injection and tablets, had a reaction. She has taken amoxicillin with no reaction Has patient had a PCN reaction causing immediate rash, facial/tongue/throat swelling, SOB or lightheadedness with hypotension: Yes Has patient had a PCN reaction causing severe rash involving mucus membranes or skin necrosis: Yes Has patient had a PCN reaction that required hospitalization No Has patient had a PCN reaction occurring within the last 10 years: No If all of the above answers are "NO", then m   Betaine Nausea And Vomiting  Dacarbazine Nausea And Vomiting   Ketorolac Tromethamine Itching   Brimonidine Itching   Diltiazem Hcl Rash   Hydrocodone-Acetaminophen Nausea And Vomiting   Neurontin [Gabapentin] Nausea And Vomiting   Sulfacetamide Sodium Rash   Home medications Prior to Admission medications   Medication Sig Start Date End Date Taking? Authorizing Provider  acetaminophen (TYLENOL) 500 MG tablet Take 1,000 mg by mouth 2 (two) times daily as needed for mild pain or moderate pain.    Yes [provider]  acyclovir (ZOVIRAX) 400 MG tablet Take 1 tablet (400 mg total) by mouth daily. 09/15/20  Yes Gorsuch, Ni, MD  allopurinol (ZYLOPRIM) 100 MG tablet Take 0.5 tablets (50 mg total) by mouth daily. 03/12/19  Yes Nita Sells, MD  apixaban (ELIQUIS) 2.5 MG TABS tablet TAKE 1 TABLET TWICE DAILY Patient taking differently: Take 2.5 mg by mouth 2 (two) times daily. 10/26/21  Yes Allred, Jeneen Rinks, MD  B Complex-C-Zn-Folic Acid (DIALYVITE 035-[id number] 15) 0.8 MG TABS Take 1 tablet by mouth daily.  07/05/21  Yes [provider]  cholecalciferol (VITAMIN D3) 25 MCG (1000 UT) tablet Take 2,000 Units by mouth daily.   Yes [provider]  citalopram (CELEXA) 10 MG tablet Take 10 mg by mouth daily.   Yes [provider]  cyclobenzaprine (FLEXERIL) 5 MG tablet Take 1 tablet (5 mg total) by mouth 3 (three) times daily as needed for muscle spasms. 10/26/21  Yes Gorsuch, Ni, MD  dexamethasone (DECADRON) 2 MG tablet TAKE 1 TABLET BY MOUTH EVERY DAY Patient taking differently: Take 2 mg by mouth daily. 08/15/21  Yes Gorsuch, Ni, MD  fluorometholone (FML) 0.1 % ophthalmic suspension Place 1 drop into the right eye daily as needed (eyes). 02/01/19  Yes [provider]  glimepiride (AMARYL) 1 MG tablet Take 0.5 mg by mouth daily.  04/07/20  Yes [provider]  lactulose (CHRONULAC) 10 GM/15ML solution TAKE 15 MLS (10 G TOTAL) BY MOUTH 3 (THREE) TIMES DAILY. Patient taking differently: Take 10 g by mouth 3 (three) times daily. 09/28/21  Yes Gorsuch, Ni, MD  lidocaine (LIDODERM) 5 % Place 1 patch onto the skin daily. 09/26/21  Yes [provider]  lidocaine-prilocaine (EMLA) cream Apply 1 application topically as needed [facility]).  05/26/20  Yes [provider]  LYRICA 50 MG capsule TAKE 1 CAPSULE (50 MG TOTAL) BY MOUTH AT BEDTIME. 09/05/21  Yes Gorsuch, Ni, MD  metoCLOPramide (REGLAN) 5 MG tablet TAKE 1 TABLET BY MOUTH 3 TIMES DAILY BEFORE MEALS Patient taking differently: Take 5 mg by mouth 3 (three) times daily before meals. 06/22/21  Yes Gorsuch, Ni, MD  midodrine (PROAMATINE) 10 MG tablet Take 10 mg by mouth as directed. Low bp, and before dialysis 12/28/20  Yes [provider]  Multiple Vitamin (MULTIVITAMIN) tablet Take 1 tablet by mouth daily.   Yes [provider]  Nutritional Supplements (FEEDING SUPPLEMENT, NEPRO CARB STEADY,) LIQD Take 237 mLs by mouth 2 (two) times daily between meals. 01/08/21  Yes Lavina Hamman, MD   ondansetron (ZOFRAN) 4 MG tablet Take 1 tablet (4 mg total) by mouth every 8 (eight) hours as needed. for nausea 10/26/21  Yes Gorsuch, Ni, MD  oxyCODONE (OXY IR/ROXICODONE) 5 MG immediate release tablet Take 1 tablet (5 mg total) by mouth every 4 (four) hours as needed for severe pain. 03/17/21  Yes Gorsuch, Ni, MD  pantoprazole (PROTONIX) 40 MG tablet TAKE 1 TABLET (40 MG TOTAL) BY MOUTH DAILY AS NEEDED (ACID REFLUX/INDIGESTION.). 02/17/21  Yes Alvy Bimler,  Ni, MD  polyethylene glycol (MIRALAX / GLYCOLAX) 17 g packet Take 17 g by mouth daily.   Yes [provider]  senna-docusate (SENOKOT-S) 8.6-50 MG tablet Take 2 tablets by mouth 2 (two) times daily. 03/11/19  Yes Nita Sells, MD  sevelamer carbonate (RENVELA) 800 MG tablet Take 800 mg by mouth in the morning, at noon, and at bedtime. 09/13/21  Yes [provider]     Vitals:   10/27/21 0411 10/27/21 0445 10/27/21 0700 10/27/21 0839  BP: (!) 99/44 (!) 104/49 (!) 108/46 (!) 108/52  Pulse: 70 70 73 72  Resp: _0 Temp:      TempSrc:      SpO2: 99% 99% 99% 99%  Weight:      Height:       Exam Gen alert, frail elderly AAF, lying in bed No rash, cyanosis or gangrene Sclera anicteric, throat clear  No jvd or bruits Chest clear bilat to bases, no rales/ wheezing RRR no MRG Abd soft ntnd no mass or ascites +bs GU deferred  MS no joint effusions or deformity Ext no LE or UE edema, no wounds or ulcers Neuro is alert, nf, gen'd deconditioning  RUA aVF+bruit      Home meds include - zyloprim, eliquis 2.5 bid, celexa, amaryl, lactulose 10gm tid, reglan 5 tid, midodrine 10 per hd tts, nepro bid, protonix, oxy IR prn, renvela 800 tid ac, prns/ supps/ vits    Na 132  K 5.8  BUN 80  CR 6.6  CA 9.7  Alb 3.0   _1  14K  Hb 8.9     OP HD: TTS HD   4h  350/500  59.5kg  2/2.5 bath  Hep 2000  R AVF  - mircera 100ug q2, last 12/15   Assessment/ Plan: AMS - seems to be better today.  Work-up in progress. Post HD  confusion I think is mostly due to pt getting/ needing several percocet's to be given prior to HD by the son for her to be able to get through (^'ing back pain) the treatment.   ESRD - on HD x 2 years. TTS HD.  Plan HD today.  Multiple myeloma - probable sig relapse w/ spinal involvement and progressing back pain.   DM2 Anemia ckd - not giving esa most likely due to malignancy MBD ckd - no vdra, Ca in range, will add on phos  Prognosis - very concerning w/ her sig back pain / bony mets affecting her ability to do dialysis. Also pt is frail and may not be a candidate for further chemoRx. If this is the case, I doubt we will be able to continue dialysis.        Kelly Splinter  MD 10/27/2021, 9:39 AM  Recent Labs  Lab 10/27/21 0125 10/27/21 0616  WBC 15.8* 14.1*  HGB 9.7* 8.9*   Recent Labs  Lab 10/27/21 0125 10/27/21 0616  K 5.3* 5.8*  BUN 74* 80*  CREATININE 6.50* 6.61*  CALCIUM 9.6 9.7

## 2021-10-27 NOTE — Evaluation (Signed)
Occupational Therapy Evaluation Patient Details Name: [name] MRN: [id number] DOB: 02-Jan-1930 Today's Date: 10/27/2021   History of Present Illness Pt is an 88 y/o female admitted secondary to confusion, sacral wound and increased back pain. Found to have C7-T1 destructive lesion. PMH includes ESRD on HD, DM, HTN, and multiple myeloma with mets to bone.   Clinical Impression   PTA, pt lives with family, typically ambulatory with RW, and has light assist for LB ADLs from family/HH aide. Per son, pt with recent functional decline since previous hospitalization with sudden onset of pain limiting ability to get OOB. Pt presents now with deficits in strength, cognition, endurance and significant low back pain with any movement. Due to pain, evaluation limited to bed level with pt requiring Total A x 1-2 for repositioning and scooting up in bed. Pt requirse Mod A for UB ADL and Total A x 2 for LB ADLs bed level, and would require maximove OOB at this time. Pt's son present, hands on to assist pt today. Educated re: skin integrity strategies and importance of pressure relief (rec gel or air w/c cushion for use at home). Recommend SNF based on current presentation. However, if family opt to take pt home, would recommend 24/7 assist and completion of ADLs bed level if pain persists.      Recommendations for follow up therapy are one component of a multi-disciplinary discharge planning process, led by the attending physician.  Recommendations may be updated based on patient status, additional functional criteria and insurance authorization.   Follow Up Recommendations  Skilled nursing-short term rehab (<3 hours/day)    Assistance Recommended at Discharge Frequent or constant Supervision/Assistance  Patient can return home with the following Two people to help with walking and/or transfers;Two people to help with bathing/dressing/bathroom;Assistance with cooking/housework;Help with stairs or ramp  for entrance    Functional Status Assessment  Patient has had a recent decline in their functional status and demonstrates the ability to make significant improvements in function in a reasonable and predictable amount of time.  Equipment Recommendations  Hospital bed;Wheelchair cushion (measurements OT) (gel vs air cushion for wheelchair)    Recommendations for Other Services       Precautions / Restrictions Precautions Precautions: Fall Precaution Comments: small sacral wound, back precautions for comfort Restrictions Weight Bearing Restrictions: No      Mobility Bed Mobility Overal bed mobility: Needs Assistance             General bed mobility comments: Total A for repositioning in bed, Total for partial roll to place pillow and offload pressure, scooting up to [facility] and minimal tolerance for HOB elevation changes    Transfers                   General transfer comment: unable due to pain      Balance                                           ADL either performed or assessed with clinical judgement   ADL Overall ADL's : Needs assistance/impaired     Grooming: Supervision/safety;Bed level;Wash/dry face   Upper Body Bathing: Moderate assistance;Bed level   Lower Body Bathing: Total assistance;+2 for physical assistance;+2 for safety/equipment;Bed level   Upper Body Dressing : Moderate assistance;Bed level   Lower Body Dressing: Total assistance;+2 for physical assistance;+2 for safety/equipment;Bed level  Toileting- Clothing Manipulation and Hygiene: Total assistance;+2 for physical assistance;+2 for safety/equipment;Bed level         General ADL Comments: Pt limited by significant pain, poor tolerance to [facility] elevation and movement but when still, pain minimized. Educated on importance of changing positions, skin integrity, having a good w/c cushion and ADL completion bed level if pain does not improve     Vision  Baseline Vision/History: 1 Wears glasses Ability to See in Adequate Light: 0 Adequate Patient Visual Report: No change from baseline Vision Assessment?: No apparent visual deficits     Perception     Praxis      Pertinent Vitals/Pain Pain Assessment: Faces Faces Pain Scale: Hurts whole lot Pain Location: low back, tailbone with movement in bed Pain Descriptors / Indicators: Guarding;Grimacing;Moaning Pain Intervention(s): Monitored during session;Limited activity within patient's tolerance;Repositioned     Hand Dominance Right   Extremity/Trunk Assessment Upper Extremity Assessment Upper Extremity Assessment: RUE deficits/detail;LUE deficits/detail RUE Deficits / Details: impaired shoulder flex AAROM to approx 105* RUE Coordination: decreased gross motor LUE Deficits / Details: impaired shoulder flex AAROM to approx 105*; crepitation felt LUE Coordination: decreased gross motor   Lower Extremity Assessment Lower Extremity Assessment: Defer to PT evaluation   Cervical / Trunk Assessment Cervical / Trunk Assessment: Kyphotic;Other exceptions Cervical / Trunk Exceptions: new lesions to C7, T1   Communication Communication Communication: No difficulties   Cognition Arousal/Alertness: Awake/alert Behavior During Therapy: Flat affect Overall Cognitive Status: Impaired/Different from baseline Area of Impairment: Orientation;Attention;Memory;Following commands;Safety/judgement;Awareness;Problem solving                 Orientation Level: Disoriented to;Time;Situation Current Attention Level: Sustained Memory: Decreased short-term memory Following Commands: Follows one step commands consistently;Follows one step commands with increased time Safety/Judgement: Decreased awareness of deficits Awareness: Intellectual Problem Solving: Slow processing;Decreased initiation;Requires verbal cues;Difficulty sequencing;Requires tactile cues General Comments: Pt reports Decemeber,  then [place] but able to self correct to January; reports 2001, 2021 for year. Kepts eyes closed for much of session, PLOF reports similar to son's reports. Son reports recent decline of memory     General Comments  Son present and supportive, unsure of DC plans at this point    Exercises     Shoulder Instructions      Home Living Family/patient expects to be discharged to:: Private residence Living Arrangements: Spouse/significant other;Children Available Help at Discharge: Family;Available 24 hours/day Type of Home: House Home Access: Stairs to enter [employer] of Steps: 3 Entrance Stairs-Rails: None Home Layout: Two level;Able to live on main level with bedroom/bathroom     Bathroom Shower/Tub: Occupational psychologist: Handicapped height Bathroom Accessibility: Yes How Accessible: Accessible via walker Home Equipment: [place] (2 wheels);Cane - single point;Shower seat;BSC/3in1;Wheelchair - manual;Grab bars - toilet;Toilet riser   Additional Comments: son reports HH planning to assist with getting hospital bed, ordered toilet riser with armrests to install this week per [facility] recommendations; unsure of w/c cushion type but son believes it is foam      Prior Functioning/Environment Prior Level of Function : Needs assist             Mobility Comments: Walking with RW until 1-2 days since ED presentation due to significant onset of pain. ADLs Comments: Requires light assist with LB dressing, LB bathing; able to complete wash up without assist but aide present. Aide assist on HD days for a few hours and spouse assists on other days with son assisting on Saturdays. Recent quick  decline since previous hospitalization 1.5 weeks ago.        OT Problem List: Decreased strength;Decreased range of motion;Decreased activity tolerance;Impaired balance (sitting and/or standing);Decreased knowledge of use of DME or AE;Decreased coordination;Decreased  cognition;Pain      OT Treatment/Interventions: Self-care/ADL training;Therapeutic exercise;DME and/or AE instruction;Therapeutic activities;Patient/family education;Balance training    OT Goals(Current goals can be found in the care plan section) Acute Rehab OT Goals Patient Stated Goal: have some food OT Goal Formulation: With patient Time For Goal Achievement: 11/10/21 Potential to Achieve Goals: Fair  OT Frequency: Min 2X/week    Co-evaluation              AM-PAC OT "6 Clicks" Daily Activity     Outcome Measure Help from another person eating meals?: A Little Help from another person taking care of personal grooming?: A Little Help from another person toileting, which includes using toliet, bedpan, or urinal?: Total Help from another person bathing (including washing, rinsing, drying)?: A Lot Help from another person to put on and taking off regular upper body clothing?: A Lot Help from another person to put on and taking off regular lower body clothing?: Total 6 Click Score: 12   End of Session Nurse Communication: Mobility status;Other (comment) (pt requesting food)  Activity Tolerance: Patient limited by pain Patient left: in bed;with call bell/phone within reach;with bed alarm set;with family/visitor present  OT Visit Diagnosis: Unsteadiness on feet (R26.81);Other abnormalities of gait and mobility (R26.89);Muscle weakness (generalized) (M62.81);History of falling (Z91.81);Pain;Other symptoms and signs involving cognitive function Pain - part of body:  (back, tailbone)                Time: 4888-9169 OT Time Calculation (min): 23 min Charges:  OT General Charges $OT Visit: 1 Visit OT Evaluation $OT Eval Moderate Complexity: 1 Mod  Malachy Chamber, OTR/L Acute Rehab Services Office: [phone]   Layla Maw 10/27/2021, 3:04 PM

## 2021-10-27 NOTE — Evaluation (Signed)
Physical Therapy Evaluation Patient Details Name: [name] MRN: [id number] DOB: Jul 11, 1930 Today's Date: 10/27/2021  History of Present Illness  Pt is an 88 y/o female admitted secondary to confusion, sacral wound and increased back pain. Found to have C7-T1 destructive lesion. PMH includes ESRD on HD, DM, HTN, and multiple myeloma with mets to bone.  Clinical Impression  Pt admitted secondary to problem above with deficits below. Pt requiring total A for bed mobility tasks. Poor sitting tolerance secondary to pain and requiring max A for sitting balance. Per previous notes, pt was ambulating with RW, however, has had significant decline in function. Recommending SNF level therapies at d/c to address current deficits. Will continue to follow acutely.        Recommendations for follow up therapy are one component of a multi-disciplinary discharge planning process, led by the attending physician.  Recommendations may be updated based on patient status, additional functional criteria and insurance authorization.  Follow Up Recommendations Skilled nursing-short term rehab (<3 hours/day)    Assistance Recommended at Discharge Frequent or constant Supervision/Assistance  Patient can return home with the following  A lot of help with walking and/or transfers;A lot of help with bathing/dressing/bathroom;Help with stairs or ramp for entrance;Assist for transportation    Equipment Recommendations Wheelchair (measurements PT);Wheelchair cushion (measurements PT);Hospital bed;Other (comment) (hoyer lift with pad)  Recommendations for Other Services       Functional Status Assessment Patient has had a recent decline in their functional status and demonstrates the ability to make significant improvements in function in a reasonable and predictable amount of time.     Precautions / Restrictions Precautions Precautions: Fall Precaution Comments: Maintained back/cervical precautions to help with  pain management. Restrictions Weight Bearing Restrictions: No      Mobility  Bed Mobility Overal bed mobility: Needs Assistance Bed Mobility: Rolling;Sidelying to Sit;Sit to Sidelying Rolling: Total assist Sidelying to sit: Total assist     Sit to sidelying: Total assist General bed mobility comments: Required total A for trunk and LE assist. Pt with posterior lean in sitting and requiring max A to maintain sitting. Unable to tolerate further mobility.    Transfers                        Ambulation/Gait                  Stairs            Wheelchair Mobility    Modified Rankin (Stroke Patients Only)       Balance Overall balance assessment: Needs assistance Sitting-balance support: Bilateral upper extremity supported Sitting balance-Leahy Scale: Poor Sitting balance - Comments: Max A for sitting balance                                     Pertinent Vitals/Pain Pain Assessment: 0-10 Pain Score: 8  Pain Location: back Pain Descriptors / Indicators: Guarding;Grimacing;Moaning Pain Intervention(s): Limited activity within patient's tolerance;Monitored during session;Repositioned    Home Living Family/patient expects to be discharged to:: Private residence Living Arrangements: Spouse/significant other;Children Available Help at Discharge: Family Type of Home: House Home Access: Stairs to enter Entrance Stairs-Rails: None Entrance Stairs-Number of Steps: 3   Home Layout: Two level;Able to live on main level with bedroom/bathroom Home Equipment: Rolling Walker (2 wheels);Cane - single point;Shower seat;BSC/3in1;Wheelchair - manual      Prior Function Prior Level  of Function : Needs assist             Mobility Comments: Walking with RW per pt, however, unsure of accuracy ADLs Comments: Requires assist with ADL tasks     Hand Dominance        Extremity/Trunk Assessment   Upper Extremity Assessment Upper  Extremity Assessment: Generalized weakness    Lower Extremity Assessment Lower Extremity Assessment: Generalized weakness;RLE deficits/detail;LLE deficits/detail RLE Deficits / Details: Limited ROM secondary to pain LLE Deficits / Details: Limited ROM secondary to pain    Cervical / Trunk Assessment Cervical / Trunk Assessment: Kyphotic;Other exceptions Cervical / Trunk Exceptions: new lesions to C7, T1  Communication   Communication: No difficulties  Cognition Arousal/Alertness: Awake/alert Behavior During Therapy: Flat affect Overall Cognitive Status: No family/caregiver present to determine baseline cognitive functioning                                 General Comments: Pt reporting it was September. Very repetitive throughout. Asking "what happened to me" multiple times throughout session.        General Comments      Exercises     Assessment/Plan    PT Assessment Patient needs continued PT services  PT Problem List Decreased strength;Decreased activity tolerance;Decreased balance;Decreased range of motion;Decreased mobility;Decreased knowledge of use of DME;Decreased knowledge of precautions;Decreased safety awareness;Pain       PT Treatment Interventions DME instruction;Gait training;Stair training;Functional mobility training;Therapeutic activities;Balance training;Therapeutic exercise;Patient/family education    PT Goals (Current goals can be found in the Care Plan section)  Acute Rehab PT Goals PT Goal Formulation: Patient unable to participate in goal setting Time For Goal Achievement: 11/10/21 Potential to Achieve Goals: Fair    Frequency Min 2X/week     Co-evaluation               AM-PAC PT "6 Clicks" Mobility  Outcome Measure Help needed turning from your back to your side while in a flat bed without using bedrails?: Total Help needed moving from lying on your back to sitting on the side of a flat bed without using bedrails?:  Total Help needed moving to and from a bed to a chair (including a wheelchair)?: Total Help needed standing up from a chair using your arms (e.g., wheelchair or bedside chair)?: Total Help needed to walk in hospital room?: Total Help needed climbing 3-5 steps with a railing? : Total 6 Click Score: 6    End of Session Equipment Utilized During Treatment: Gait belt Activity Tolerance: Patient limited by pain Patient left: in bed;with call bell/phone within reach (on stretcher in ED) Nurse Communication: Mobility status PT Visit Diagnosis: Unsteadiness on feet (R26.81);Muscle weakness (generalized) (M62.81);Difficulty in walking, not elsewhere classified (R26.2)    Time: 1779-3903 PT Time Calculation (min) (ACUTE ONLY): 23 min   Charges:   PT Evaluation $PT Eval Moderate Complexity: 1 Mod PT Treatments $Therapeutic Activity: 8-22 mins        Lou Miner, DPT  Acute Rehabilitation Services  Pager: [phone] Office: [phone]   Rudean Hitt 10/27/2021, 11:36 AM

## 2021-10-27 NOTE — ED Notes (Signed)
Patient resting in stretcher comfortably. Eyes closed, Equal chest rise and fall. Patient alert to verbal stimuli. Call bell in reach, Stretcher in low and locked position. Side rails up x2.   

## 2021-10-27 NOTE — H&P (Signed)
History and Physical    [name] VZS:[id number] DOB: 01/11/1930 DOA: 10/26/2021  PCP: Lucianne Lei, MD   Patient coming from: Home   Chief Complaint: Confusion, back pain, wound   HPI: [name] is a pleasant 88 y.o. female with medical history significant for ESRD on hemodialysis, type 2 diabetes mellitus, OSA, sick sinus syndrome with pacer, multiple myeloma, and chronic cancer-related pain, now presenting to the emergency department with confusion, back pain, and low back wound.  Patient was brought in by her son who is concerned for several days of progressive confusion, increased back pain, and a new sacral wound.  Patient reportedly completed dialysis on 10/25/2021.  She was noted to have a new wound at her sacrum that day home health nurse has dressed.  She saw her oncologist on 10/24/2021, there was concern for cancer relapse, and concerned that she may not tolerate any further chemotherapy.  Patient was previously DNR but has more recently indicated that she wishes to be full code and has paperwork at the bedside documenting this.  ED Course: Upon arrival to the ED, patient is found to be afebrile, saturating well on room air, and with blood pressure 105/46.  Chemistry panel notable for BUN 74, sodium 132, and potassium 5.3.  CBC with leukocytosis to 15,800 and macrocytic anemia with hemoglobin 9.7.  CT of the chest/abdomen/pelvis demonstrates diffuse osseous lucencies, left paraspinal mass, and destructive lesion involving T1 and C7.  COVID and influenza screening was negative.  Review of Systems:  ROS limited by patient's clinical condition.  Past Medical History:  Diagnosis Date   Anemia    Arthritis    Benign paroxysmal positional vertigo 04/06/2010   CAROTID BRUIT 44/92/0100   Complication of anesthesia    took a long time to wake up with knee replacement   DIABETES MELLITUS, TYPE II 02/24/2008   ESRD (end stage renal disease) on dialysis ([place])    [facility] [place] [facility]   GERD (gastroesophageal reflux disease)    Glaucoma    Gout    HYPERTENSION 02/24/2008   Multiple myeloma ([place])    Obstructive sleep apnea    Pancytopenia ([place])    Sick sinus syndrome ([place])    now with pacemaker   Wears dentures    Wears glasses     Past Surgical History:  Procedure Laterality Date   A/V FISTULAGRAM Right 03/08/2020   Procedure: A/V FISTULAGRAM- Right Arm;  Surgeon: Waynetta Sandy, MD;  Location: [facility];  Service: Cardiovascular;  Laterality: Right;   A/V FISTULAGRAM N/A 06/28/2020   Procedure: A/V FISTULAGRAM - Right Upper Arm;  Surgeon: Waynetta Sandy, MD;  Location: [facility];  Service: Cardiovascular;  Laterality: N/A;   ABDOMINAL HYSTERECTOMY     1980's   AV FISTULA PLACEMENT Right 03/11/2019   Procedure: CREATION RIGHT ARM RADIOCEPHALIC ARTERIOVENOUS FISTULA;  Surgeon: Angelia Mould, MD;  Location: [facility];  Service: Vascular;  Laterality: Right;   AV FISTULA PLACEMENT Right 03/25/2020   Procedure: right arm ARTERIOVENOUS (AV) FISTULA CREATION;  Surgeon: Waynetta Sandy, MD;  Location: [facility];  Service: Vascular;  Laterality: Right;   BACK SURGERY     x 3   BIOPSY  06/24/2019   Procedure: BIOPSY;  Surgeon: Ronald Lobo, MD;  Location: [facility];  Service: Endoscopy;;   BUBBLE STUDY  01/07/2021   Procedure: BUBBLE STUDY;  Surgeon: Sueanne Margarita, MD;  Location: [facility];  Service: Cardiovascular;;   CARDIOVERSION N/A 01/07/2021   Procedure: CARDIOVERSION;  Surgeon: Sueanne Margarita, MD;  Location: [facility];  Service: Cardiovascular;  Laterality: N/A;   EP IMPLANTABLE DEVICE N/A 05/16/2016   Procedure: PPM Generator Changeout;  Surgeon: Thompson Grayer, MD;  Location: [place] [facility];  Service: Cardiovascular;  Laterality: N/A;   ESOPHAGOGASTRODUODENOSCOPY (EGD) WITH PROPOFOL N/A 06/24/2019   Procedure: ESOPHAGOGASTRODUODENOSCOPY (EGD) WITH PROPOFOL;  Surgeon:  Ronald Lobo, MD;  Location: [facility];  Service: Endoscopy;  Laterality: N/A;   FISTULA SUPERFICIALIZATION Right 03/11/2019   Procedure: FISTULA SUPERFICIALIZATION;  Surgeon: Angelia Mould, MD;  Location: [place];  Service: Vascular;  Laterality: Right;   INSERTION OF DIALYSIS CATHETER Right 03/25/2020   Procedure: INSERTION OF DIALYSIS CATHETER, right internal jugular;  Surgeon: Waynetta Sandy, MD;  Location: [place];  Service: Vascular;  Laterality: Right;   IR FLUORO GUIDE CV LINE RIGHT  03/07/2019   IR US GUIDE VASC ACCESS RIGHT  03/07/2019   KNEE SURGERY Right    2003   KNEE SURGERY Left    2011   PACEMAKER INSERTION     PERIPHERAL VASCULAR BALLOON ANGIOPLASTY Right 06/28/2020   Procedure: PERIPHERAL VASCULAR BALLOON ANGIOPLASTY;  Surgeon: Waynetta Sandy, MD;  Location: [place] [facility];  Service: Cardiovascular;  Laterality: Right;   TEE WITHOUT CARDIOVERSION N/A 01/07/2021   Procedure: TRANSESOPHAGEAL ECHOCARDIOGRAM (TEE);  Surgeon: Sueanne Margarita, MD;  Location: [facility];  Service: Cardiovascular;  Laterality: N/A;    Social History:   reports that she has never smoked. She has never used smokeless tobacco. She reports that she does not drink alcohol and does not use drugs.  Allergies  Allergen Reactions   Aspirin Other (See Comments)    REACTION: STOMACH ISSUES WITH DOSE HIGHER THAN 81 MG  Other reaction(s): GI Upset (intolerance), Other (See Comments) REACTION: STOMACH ISSUES WITH DOSE HIGHER THAN 81 MG  REACTION: STOMACH ISSUES WITH DOSE HIGHER THAN 81 MG    Hydrocodone-Acetaminophen Nausea And Vomiting   Morphine Nausea And Vomiting   Penicillins Rash    Patient took injection and tablets, had a reaction. She has taken amoxicillin with no reaction Has patient had a PCN reaction causing immediate rash, facial/tongue/throat swelling, SOB or lightheadedness with hypotension: Yes Has patient had a PCN reaction causing severe rash involving  mucus membranes or skin necrosis: Yes Has patient had a PCN reaction that required hospitalization No Has patient had a PCN reaction occurring within the last 10 years: No If all of the above answers are "NO", then m Patient took injection and tablets, had a reaction. She has taken amoxicillin with no reaction Patient took injection and tablets, had a reaction. She has taken amoxicillin with no reaction Has patient had a PCN reaction causing immediate rash, facial/tongue/throat swelling, SOB or lightheadedness with hypotension: Yes Has patient had a PCN reaction causing severe rash involving mucus membranes or skin necrosis: Yes Has patient had a PCN reaction that required hospitalization No Has patient had a PCN reaction occurring within the last 10 years: No If all of the above answers are "NO", then m   Betaine Nausea And Vomiting   Dacarbazine Nausea And Vomiting   Ketorolac Tromethamine Itching   Brimonidine Itching   Diltiazem Hcl Rash   Hydrocodone-Acetaminophen Nausea And Vomiting   Neurontin [Gabapentin] Nausea And Vomiting   Sulfacetamide Sodium Rash    Family History  Problem Relation Age of Onset   Congestive Heart Failure Mother  Tuberculosis Mother    Multiple myeloma Mother    Bone cancer Father    Arthritis Father    Breast cancer Sister    Colon cancer Sister    Hypertension Sister    Dementia Sister    Alcohol abuse Son      Prior to Admission medications   Medication Sig Start Date End Date Taking? Authorizing Provider  acetaminophen (TYLENOL) 500 MG tablet Take 1,000 mg by mouth 2 (two) times daily as needed for mild pain or moderate pain.    Yes [provider]  acyclovir (ZOVIRAX) 400 MG tablet Take 1 tablet (400 mg total) by mouth daily. 09/15/20  Yes Gorsuch, Ni, MD  allopurinol (ZYLOPRIM) 100 MG tablet Take 0.5 tablets (50 mg total) by mouth daily. 03/12/19  Yes Nita Sells, MD  apixaban (ELIQUIS) 2.5 MG TABS tablet TAKE 1 TABLET  TWICE DAILY Patient taking differently: Take 2.5 mg by mouth 2 (two) times daily. 10/26/21  Yes Allred, Jeneen Rinks, MD  B Complex-C-Zn-Folic Acid (DIALYVITE 803-OZYY 15) 0.8 MG TABS Take 1 tablet by mouth daily. 07/05/21  Yes [provider]  cholecalciferol (VITAMIN D3) 25 MCG (1000 UT) tablet Take 2,000 Units by mouth daily.   Yes [provider]  citalopram (CELEXA) 10 MG tablet Take 10 mg by mouth daily.   Yes [provider]  cyclobenzaprine (FLEXERIL) 5 MG tablet Take 1 tablet (5 mg total) by mouth 3 (three) times daily as needed for muscle spasms. 10/26/21  Yes Gorsuch, Ni, MD  dexamethasone (DECADRON) 2 MG tablet TAKE 1 TABLET BY MOUTH EVERY DAY Patient taking differently: Take 2 mg by mouth daily. 08/15/21  Yes Gorsuch, Ni, MD  fluorometholone (FML) 0.1 % ophthalmic suspension Place 1 drop into the right eye daily as needed (eyes). 02/01/19  Yes [provider]  glimepiride (AMARYL) 1 MG tablet Take 0.5 mg by mouth daily.  04/07/20  Yes [provider]  lactulose (CHRONULAC) 10 GM/15ML solution TAKE 15 MLS (10 G TOTAL) BY MOUTH 3 (THREE) TIMES DAILY. Patient taking differently: Take 10 g by mouth 3 (three) times daily. 09/28/21  Yes Gorsuch, Ni, MD  lidocaine (LIDODERM) 5 % Place 1 patch onto the skin daily. 09/26/21  Yes [provider]  lidocaine-prilocaine (EMLA) cream Apply 1 application topically as needed [facility]).  05/26/20  Yes [provider]  LYRICA 50 MG capsule TAKE 1 CAPSULE (50 MG TOTAL) BY MOUTH AT BEDTIME. 09/05/21  Yes Gorsuch, Ni, MD  metoCLOPramide (REGLAN) 5 MG tablet TAKE 1 TABLET BY MOUTH 3 TIMES DAILY BEFORE MEALS Patient taking differently: Take 5 mg by mouth 3 (three) times daily before meals. 06/22/21  Yes Gorsuch, Ni, MD  midodrine (PROAMATINE) 10 MG tablet Take 10 mg by mouth as directed. Low bp, and before dialysis 12/28/20  Yes [provider]  Multiple Vitamin (MULTIVITAMIN) tablet Take 1 tablet by mouth  daily.   Yes [provider]  Nutritional Supplements (FEEDING SUPPLEMENT, NEPRO CARB STEADY,) LIQD Take 237 mLs by mouth 2 (two) times daily between meals. 01/08/21  Yes Lavina Hamman, MD  ondansetron (ZOFRAN) 4 MG tablet Take 1 tablet (4 mg total) by mouth every 8 (eight) hours as needed. for nausea 10/26/21  Yes Gorsuch, Ni, MD  oxyCODONE (OXY IR/ROXICODONE) 5 MG immediate release tablet Take 1 tablet (5 mg total) by mouth every 4 (four) hours as needed for severe pain. 03/17/21  Yes Gorsuch, Ni, MD  pantoprazole (PROTONIX) 40 MG tablet TAKE 1 TABLET (40 MG  TOTAL) BY MOUTH DAILY AS NEEDED (ACID REFLUX/INDIGESTION.). 02/17/21  Yes Gorsuch, Ni, MD  polyethylene glycol (MIRALAX / GLYCOLAX) 17 g packet Take 17 g by mouth daily.   Yes [provider]  senna-docusate (SENOKOT-S) 8.6-50 MG tablet Take 2 tablets by mouth 2 (two) times daily. 03/11/19  Yes Nita Sells, MD  sevelamer carbonate (RENVELA) 800 MG tablet Take 800 mg by mouth in the morning, at noon, and at bedtime. 09/13/21  Yes [provider]    Physical Exam: Vitals:   10/26/21 2353 10/26/21 2353 10/27/21 0030 10/27/21 0200  BP: (!) 151/72  (!) 128/51 (!) 105/46  Pulse: 71  70 69  Resp:   16 (!) 22  Temp:      TempSrc:      SpO2:  98% 98% 99%  Weight:      Height:        Constitutional: NAD, calm  Eyes: PERTLA, lids and conjunctivae normal ENMT: Mucous membranes are moist. Posterior pharynx clear of any exudate or lesions.   Neck: supple, no masses  Respiratory: no wheezing, no crackles. No accessory muscle use.  Cardiovascular: Rate ~80 and irregular. No extremity edema.  Abdomen: No distension, no tenderness, soft. Bowel sounds active.  Musculoskeletal: no clubbing / cyanosis. No joint deformity upper and lower extremities.   Skin: erythema over sacrum. Warm, dry, well-perfused. Neurologic: CN 2-12 grossly intact. Moving all extremities.  Somnolent.    Labs and Imaging on Admission: I have  personally reviewed following labs and imaging studies  CBC: Recent Labs  Lab 10/24/21 1234 10/27/21 0125  WBC 8.8 15.8*  NEUTROABS 6.8 13.4*  HGB 9.4* 9.7*  HCT 28.9* 30.1*  MCV 99.0 101.0*  PLT 225 941   Basic Metabolic Panel: Recent Labs  Lab 10/24/21 1234 10/27/21 0125  NA 133* 132*  K 4.4 5.3*  CL 94* 93*  CO2 24 23  GLUCOSE 116* 91  BUN 65* 74*  CREATININE 5.90* 6.50*  CALCIUM 9.4 9.6  MG  --  2.8*   GFR: Estimated Creatinine Clearance: 5.3 mL/min (A) (by C-G formula based on SCr of 6.5 mg/dL (H)). Liver Function Tests: Recent Labs  Lab 10/24/21 1234 10/27/21 0125  AST 19 29  ALT 14 19  ALKPHOS 43 39  BILITOT 0.3 0.6  PROT 7.7 7.6  ALBUMIN 3.6 3.0*   No results for input(s): LIPASE, AMYLASE in the last 168 hours. No results for input(s): AMMONIA in the last 168 hours. Coagulation Profile: No results for input(s): INR, PROTIME in the last 168 hours. Cardiac Enzymes: No results for input(s): CKTOTAL, CKMB, CKMBINDEX, TROPONINI in the last 168 hours. BNP (last 3 results) No results for input(s): PROBNP in the last 8760 hours. HbA1C: No results for input(s): HGBA1C in the last 72 hours. CBG: No results for input(s): GLUCAP in the last 168 hours. Lipid Profile: No results for input(s): CHOL, HDL, LDLCALC, TRIG, CHOLHDL, LDLDIRECT in the last 72 hours. Thyroid Function Tests: No results for input(s): TSH, T4TOTAL, FREET4, T3FREE, THYROIDAB in the last 72 hours. Anemia Panel: No results for input(s): VITAMINB12, FOLATE, FERRITIN, TIBC, IRON, RETICCTPCT in the last 72 hours. Urine analysis:    Component Value Date/Time   COLORURINE YELLOW 11/27/2019 1430   APPEARANCEUR CLEAR 11/27/2019 1430   LABSPEC 1.010 11/27/2019 1430   PHURINE 9.0 (H) 11/27/2019 1430   GLUCOSEU NEGATIVE 11/27/2019 1430   HGBUR NEGATIVE 11/27/2019 1430   BILIRUBINUR NEGATIVE 11/27/2019 1430   KETONESUR NEGATIVE 11/27/2019 1430   PROTEINUR NEGATIVE 11/27/2019  1430    UROBILINOGEN 1.0 10/05/2012 1953   NITRITE NEGATIVE 11/27/2019 1430   LEUKOCYTESUR SMALL (A) 11/27/2019 1430   Sepsis Labs: _0 (procalcitonin:4,lacticidven:4) ) Recent Results (from the past 240 hour(s))  Resp Panel by RT-PCR (Flu A&B, Covid) Nasopharyngeal Swab     Status: None   Collection Time: 10/27/21 12:33 AM   Specimen: Nasopharyngeal Swab; Nasopharyngeal(NP) swabs in vial transport medium  Result Value Ref Range Status   SARS Coronavirus 2 by RT PCR NEGATIVE NEGATIVE Final    Comment: (NOTE) SARS-CoV-2 target nucleic acids are NOT DETECTED.  The SARS-CoV-2 RNA is generally detectable in upper respiratory specimens during the acute phase of infection. The lowest concentration of SARS-CoV-2 viral copies this assay can detect is 138 copies/mL. A negative result does not preclude SARS-Cov-2 infection and should not be used as the sole basis for treatment or other patient management decisions. A negative result may occur with  improper specimen collection/handling, submission of specimen other than nasopharyngeal swab, presence of viral mutation(s) within the areas targeted by this assay, and inadequate number of viral copies(<138 copies/mL). A negative result must be combined with clinical observations, patient history, and epidemiological information. The expected result is Negative.  Fact Sheet for Patients:  EntrepreneurPulse.com.au  Fact Sheet for Healthcare Providers:  IncredibleEmployment.be  This test is no t yet approved or cleared by the [place] FDA and  has been authorized for detection and/or diagnosis of SARS-CoV-2 by FDA under an Emergency Use Authorization (EUA). This EUA will remain  in effect (meaning this test can be used) for the duration of the COVID-19 declaration under Section 564(b)(1) of the Act, 21 U.S.C.section 360bbb-3(b)(1), unless the authorization is terminated  or revoked sooner.        Influenza A by PCR NEGATIVE NEGATIVE Final   Influenza B by PCR NEGATIVE NEGATIVE Final    Comment: (NOTE) The Xpert Xpress SARS-CoV-2/FLU/RSV plus assay is intended as an aid in the diagnosis of influenza from Nasopharyngeal swab specimens and should not be used as a sole basis for treatment. Nasal washings and aspirates are unacceptable for Xpert Xpress SARS-CoV-2/FLU/RSV testing.  Fact Sheet for Patients: EntrepreneurPulse.com.au  Fact Sheet for Healthcare Providers: IncredibleEmployment.be  This test is not yet approved or cleared by the [place] FDA and has been authorized for detection and/or diagnosis of SARS-CoV-2 by FDA under an Emergency Use Authorization (EUA). This EUA will remain in effect (meaning this test can be used) for the duration of the COVID-19 declaration under Section 564(b)(1) of the Act, 21 U.S.C. section 360bbb-3(b)(1), unless the authorization is terminated or revoked.  Performed at [facility], [place] [address] [place], [place] [address]      Radiological Exams on Admission: CT CHEST ABDOMEN PELVIS WO CONTRAST  Result Date: 10/27/2021 CLINICAL DATA:  Trauma. EXAM: CT CHEST, ABDOMEN AND PELVIS WITHOUT CONTRAST TECHNIQUE: Multidetector CT imaging of the chest, abdomen and pelvis was performed following the standard protocol without IV contrast. RADIATION DOSE REDUCTION: This exam was performed according to the departmental dose-optimization program which includes automated exposure control, adjustment of the mA and/or kV according to patient size and/or use of iterative reconstruction technique. COMPARISON:  Thoracic and lumbar spine CT dated 10/11/2021 and chest CT dated 09/10/2020. FINDINGS: Evaluation of this exam is limited in the absence of intravenous contrast. CT CHEST FINDINGS Cardiovascular: Moderate cardiomegaly. No pericardial effusion. Advanced 3 vessel coronary vascular calcification. There is severe  atherosclerotic calcification of the thoracic aorta. No aneurysmal dilatation. Mild dilatation of  the main pulmonary trunk suggestive of pulmonary hypertension. Mediastinum/Nodes: Right hilar calcified granuloma. No adenopathy. The esophagus is grossly unremarkable. No mediastinal fluid collection. Lungs/Pleura: Bibasilar linear atelectasis/scarring. No lobar consolidation, pleural effusion, pneumothorax. The central airways are patent. Musculoskeletal: Left pectoral pacemaker device. Osteopenia with degenerative changes of the spine. There is a 3.7 x 3.9 cm paraspinal mass with infiltration of the left first and second ribs at the costovertebral junction as well as destructive changes of the T1 and probably C7 most consistent with malignancy or metastatic disease. There is severe degenerative changes of the shoulders. Fluid density adjacent to the left shoulder, likely effusion. Additional smaller scattered bone lucencies suspicious for metastatic disease. No definite acute osseous pathology. CT ABDOMEN PELVIS FINDINGS No intra-abdominal free air or free fluid. Hepatobiliary: The liver is unremarkable. No intrahepatic biliary dilatation. There is a stone within the gallbladder. No pericholecystic fluid or evidence of acute cholecystitis by CT. Pancreas: Unremarkable. No pancreatic ductal dilatation or surrounding inflammatory changes. Spleen: Normal in size without focal abnormality. Adrenals/Urinary Tract: The adrenal glands unremarkable. Moderate bilateral renal parenchyma atrophy. A 1 cm hypodense lesion from the lateral interpolar left kidney is not characterized on this CT but likely represents a cyst. There is no hydronephrosis or nephrolithiasis on either side. The visualized ureters and urinary bladder appear unremarkable. Stomach/Bowel: Large amount of stool throughout the colon. There is no bowel obstruction or active inflammation. Vascular/Lymphatic: Advanced aortoiliac atherosclerotic disease. The IVC  is unremarkable. No portal venous gas. There is no adenopathy. Reproductive: Hysterectomy. Other: None Musculoskeletal: Severe osteopenia and degenerative changes. Diffuse osseous lucencies suspicious for metastatic disease. L4-L5 disc spacer. No obvious acute fracture. IMPRESSION: 1. No acute traumatic injury in the chest, abdomen, or pelvis. 2. Left paraspinal mass with infiltration of the left first and second ribs at the costovertebral junction as well as destructive changes of the T1 and probably C7 ribs most consistent with malignancy or metastatic disease. Diffuse osseous lucencies suspicious for metastatic disease. 3. Cholelithiasis. 4. Large amount of stool throughout the colon. No bowel obstruction. 5. Aortic Atherosclerosis (ICD10-I70.0). Electronically Signed   By: Anner Crete M.D.   On: 10/27/2021 01:02     Assessment/Plan  1. Acute encephalopathy  - Brought in by family for several days of progressive confusion  - She is somnolent in ED and unable to provide much history  - Check head CT, hold sedating medications, check blood gas, TSH, B12, RPR, and ammonia    2. Multiple myeloma - Followed by Dr. Alvy Bimler  - Recent labs concerning for relapse - CT in ED with widespread lytic lesions, large destructive lesion involving T1 and C7 with paraspinal soft tissue mass  - Oncology concerned she may not tolerate further chemo per recent note    3. ESRD  - Reports completing dialysis on 10/25/21  - Renally-dose medications, restrict fluid, continue phosphate binder, consult nephrology in am for dialysis  4. Chronic pain  - She is somnolent in ED and sedating medications held initially  5. Pressure injury  - Sacral pressure wound poa  - Wound care, supportive care   6. Atrial flutter  - Continue Eliquis   7. Type II DM  - A1c was only 4.6% in March 2022 and she is eating less per family, may not need glimepiride any more  - Hold glimepiride, check CBGs, use low-intensity SSI if  needed    DVT prophylaxis: Eliquis  Code Status: Full, confirmed with patient in ED. Previously DNR, now has document at  bedside indicating her wish to be full code.   Level of Care: Level of care: Med-Surg Family Communication: son updated from ED  Disposition Plan:  Patient is from: Home  Anticipated d/c is to: TBD Anticipated d/c date is: 10/28/21  Patient currently: Pending CT head, additional labs, PT eval  Consults called: none  Admission status: observation     Vianne Bulls, MD [facility]  10/27/2021, 3:26 AM

## 2021-10-27 NOTE — ED Notes (Signed)
Patient transported to CT via stretcher.

## 2021-10-27 NOTE — ED Provider Notes (Signed)
Emergency Department Provider Note   I have reviewed the triage vital signs and the nursing notes.   HISTORY  Chief Complaint Back Pain   HPI [name] is a 88 y.o. female with PMH of Multiple Myeloma, DM, ESRD, and HTN presents to the emergency department for evaluation of lower back pain with new sacral wound noted by family and caregivers at home.  Patient has been compliant with dialysis.  The patient's son is at bedside and describes increased pain along with confusion and difficulty with speech.  The difficulty with speech and confusion have progressed over the last several days.  No fevers.  Today, she seemed to have much more severe back pain along with new sacral wound.  He along with home care placed a dressing in the area.  No bleeding or drainage noted.  No other rash.     Past Medical History:  Diagnosis Date   Anemia    Arthritis    Benign paroxysmal positional vertigo 04/06/2010   CAROTID BRUIT 78/58/8502   Complication of anesthesia    took a Geanna Divirgilio time to wake up with knee replacement   DIABETES MELLITUS, TYPE II 02/24/2008   ESRD (end stage renal disease) on dialysis ([place])    [facility] thurs [facility]   GERD (gastroesophageal reflux disease)    Glaucoma    Gout    HYPERTENSION 02/24/2008   Multiple myeloma ([facility])    Obstructive sleep apnea    Pancytopenia ([place])    Sick sinus syndrome ([place])    now with pacemaker   Wears dentures    Wears glasses     Review of Systems  Constitutional: No fever/chills Cardiovascular: Denies chest pain. Respiratory: Denies shortness of breath. Gastrointestinal: No abdominal pain.   Musculoskeletal: Positive acute on chronic back pain.  Skin: Negative for rash. New sacral wound.   ____________________________________________   PHYSICAL EXAM:  VITAL SIGNS: ED Triage Vitals  Enc Vitals Group     BP 10/26/21 2353 (!) 151/72     Pulse Rate 10/26/21 2353 71     Resp 10/26/21 2351  17     Temp 10/26/21 2350 97.7 F (36.5 C)     Temp Source 10/26/21 2350 Oral     SpO2 10/26/21 2353 98 %     Weight 10/26/21 2352 136 lb 11 oz (62 kg)     Height 10/26/21 2352 5' 5" (1.651 m)   Constitutional: Alert and oriented. Well appearing and in no acute distress. Eyes: Conjunctivae are normal.  Head: Atraumatic. Nose: No congestion/rhinnorhea. Mouth/Throat: Mucous membranes are moist.  Neck: No stridor. Cardiovascular: Normal rate, regular rhythm. Good peripheral circulation. Grossly normal heart sounds.   Respiratory: Normal respiratory effort.  No retractions. Lungs CTAB. Gastrointestinal: Soft and nontender. No distention.  Musculoskeletal: No lower extremity tenderness nor edema. No gross deformities of extremities. Neurologic:  Normal speech and language. No gross focal neurologic deficits are appreciated.  Skin:  Skin is warm a dry. 2 cm sacral wound. No cellulitis.   ____________________________________________   LABS (all labs ordered are listed, but only abnormal results are displayed)  Labs Reviewed  COMPREHENSIVE METABOLIC PANEL - Abnormal; Notable for the following components:      Result Value   Sodium 132 (*)    Potassium 5.3 (*)    Chloride 93 (*)    BUN 74 (*)    Creatinine, Ser 6.50 (*)    Albumin 3.0 (*)    GFR,  6 (*)   ° Anion gap 16 (*)   ° All other components within normal limits  °MAGNESIUM - Abnormal; Notable for the following components:  ° Magnesium 2.8 (*)   ° All other components within normal limits  °CBC WITH DIFFERENTIAL/PLATELET - Abnormal; Notable for the following components:  ° WBC 15.8 (*)   ° RBC 2.98 (*)   ° Hemoglobin 9.7 (*)   ° HCT 30.1 (*)   ° MCV 101.0 (*)   ° RDW 16.8 (*)   ° Neutro Abs 13.4 (*)   ° Monocytes Absolute 1.3 (*)   ° Abs Immature Granulocytes 0.11 (*)   ° All other components within normal limits  °RESP PANEL BY RT-PCR (FLU A&B, COVID) ARPGX2  °HEMOGLOBIN A1C  °BASIC METABOLIC PANEL  °CBC  °TSH  °AMMONIA   °RPR  °VITAMIN B12  °BLOOD GAS, VENOUS  ° ° °____________________________________________ ° °RADIOLOGY ° °CT Head Wo Contrast ° °Result Date: 10/27/2021 °CLINICAL DATA:  Mental status changes EXAM: CT HEAD WITHOUT CONTRAST TECHNIQUE: Contiguous axial images were obtained from the base of the skull through the vertex without intravenous contrast. RADIATION DOSE REDUCTION: This exam was performed according to the departmental dose-optimization program which includes automated exposure control, adjustment of the mA and/or kV according to patient size and/or use of iterative reconstruction technique. COMPARISON:  03/21/2020 FINDINGS: Brain: There is atrophy and chronic small vessel disease changes. No acute intracranial abnormality. Specifically, no hemorrhage, hydrocephalus, mass lesion, acute infarction, or significant intracranial injury. Vascular: No hyperdense vessel or unexpected calcification. Skull: No acute calvarial abnormality. Sinuses/Orbits: No acute findings Other: None IMPRESSION: Atrophy, chronic microvascular disease. No acute intracranial abnormality. Electronically Signed   By: Kevin  Dover M.D.   On: 10/27/2021 03:38  ° °CT CHEST ABDOMEN PELVIS WO CONTRAST ° °Result Date: 10/27/2021 °CLINICAL DATA:  Trauma. EXAM: CT CHEST, ABDOMEN AND PELVIS WITHOUT CONTRAST TECHNIQUE: Multidetector CT imaging of the chest, abdomen and pelvis was performed following the standard protocol without IV contrast. RADIATION DOSE REDUCTION: This exam was performed according to the departmental dose-optimization program which includes automated exposure control, adjustment of the mA and/or kV according to patient size and/or use of iterative reconstruction technique. COMPARISON:  Thoracic and lumbar spine CT dated 10/11/2021 and chest CT dated 09/10/2020. FINDINGS: Evaluation of this exam is limited in the absence of intravenous contrast. CT CHEST FINDINGS Cardiovascular: Moderate cardiomegaly. No pericardial effusion.  Advanced 3 vessel coronary vascular calcification. There is severe atherosclerotic calcification of the thoracic aorta. No aneurysmal dilatation. Mild dilatation of the main pulmonary trunk suggestive of pulmonary hypertension. Mediastinum/Nodes: Right hilar calcified granuloma. No adenopathy. The esophagus is grossly unremarkable. No mediastinal fluid collection. Lungs/Pleura: Bibasilar linear atelectasis/scarring. No lobar consolidation, pleural effusion, pneumothorax. The central airways are patent. Musculoskeletal: Left pectoral pacemaker device. Osteopenia with degenerative changes of the spine. There is a 3.7 x 3.9 cm paraspinal mass with infiltration of the left first and second ribs at the costovertebral junction as well as destructive changes of the T1 and probably C7 most consistent with malignancy or metastatic disease. There is severe degenerative changes of the shoulders. Fluid density adjacent to the left shoulder, likely effusion. Additional smaller scattered bone lucencies suspicious for metastatic disease. No definite acute osseous pathology. CT ABDOMEN PELVIS FINDINGS No intra-abdominal free air or free fluid. Hepatobiliary: The liver is unremarkable. No intrahepatic biliary dilatation. There is a stone within the gallbladder. No pericholecystic fluid or evidence of acute cholecystitis by CT. Pancreas: Unremarkable. No pancreatic ductal dilatation or   surrounding inflammatory changes. Spleen: Normal in size without focal abnormality. Adrenals/Urinary Tract: The adrenal glands unremarkable. Moderate bilateral renal parenchyma atrophy. A 1 cm hypodense lesion from the lateral interpolar left kidney is not characterized on this CT but likely represents a cyst. There is no hydronephrosis or nephrolithiasis on either side. The visualized ureters and urinary bladder appear unremarkable. Stomach/Bowel: Large amount of stool throughout the colon. There is no bowel obstruction or active inflammation.  Vascular/Lymphatic: Advanced aortoiliac atherosclerotic disease. The IVC is unremarkable. No portal venous gas. There is no adenopathy. Reproductive: Hysterectomy. Other: None Musculoskeletal: Severe osteopenia and degenerative changes. Diffuse osseous lucencies suspicious for metastatic disease. L4-L5 disc spacer. No obvious acute fracture. IMPRESSION: 1. No acute traumatic injury in the chest, abdomen, or pelvis. 2. Left paraspinal mass with infiltration of the left first and second ribs at the costovertebral junction as well as destructive changes of the T1 and probably C7 ribs most consistent with malignancy or metastatic disease. Diffuse osseous lucencies suspicious for metastatic disease. 3. Cholelithiasis. 4. Large amount of stool throughout the colon. No bowel obstruction. 5. Aortic Atherosclerosis (ICD10-I70.0). Electronically Signed   By: Arash  Radparvar M.D.   On: 10/27/2021 01:02   ° °____________________________________________ ° ° °PROCEDURES ° °Procedure(s) performed:  ° °Procedures ° °None  °____________________________________________ ° ° °INITIAL IMPRESSION / ASSESSMENT AND PLAN / ED COURSE ° °Pertinent labs & imaging results that were available during my care of the patient were reviewed by me and considered in my medical decision making (see chart for details). °  °This patient is Presenting for Evaluation of back pain and AMS, which does require a range of treatment options, and is a complaint that involves a high risk of morbidity and mortality. ° °The Differential Diagnoses include but is not exclusive to alcohol, illicit or prescription medications, intracranial pathology such as stroke, intracerebral hemorrhage, fever or infectious causes including sepsis, hypoxemia, uremia, trauma, endocrine related disorders such as diabetes, hypoglycemia, thyroid-related diseases, etc. ° ° °Critical Interventions- pain mgmt and CT imaging.  °  °Reassessment after intervention: Patient more comfortable on  reassessment but remains somewhat somnolent. Will awaken and answer questions.  ° ° °I did Additional Historical Information from caregiver at bedside. Confirms history and timing of sacral wound.  ° °I decided to review pertinent External Data, and in summary recent admit and discharge on 12/30 with increased lytic lesions and falls. Ultimately, discharged home with PT/OT and home health. . °  °Clinical Laboratory Tests Ordered, included CBC showing leukocytosis to 15.8.  Anemia also noted but near the patient's baseline.  She has evidence of end-stage renal disease with potassium of 5.3 and magnesium of 2.8. No DKA.  ° °Radiologic Tests Ordered, included CT imaging of the head along with chest abdomen and pelvis.  I independently evaluated these images and agree with radiology interpretations.  ° °Cardiac Monitor Tracing which shows NSR ° °Consult complete with the Hospitalist.  ° °Discussed patient's case with [facility], Dr. Opyd to request admission. Patient and family (if present) updated with plan. Care transferred to [facility] service. ° °I reviewed all nursing notes, vitals, pertinent old records, EKGs, labs, imaging (as available). ° °Disposition: Admit ° °____________________________________________ ° °FINAL CLINICAL IMPRESSION(S) / ED DIAGNOSES ° °Final diagnoses:  °Acute midline low back pain without sciatica  °Somnolence  °Wound of sacral region, initial encounter  ° ° ° °MEDICATIONS GIVEN DURING THIS VISIT: ° °Medications  °allopurinol (ZYLOPRIM) tablet 50 mg (has no administration in time range)  °acyclovir (ZOVIRAX) tablet 400   400 mg (has no administration in time range)  midodrine (PROAMATINE) tablet 10 mg (has no administration in time range)  dexamethasone (DECADRON) tablet 2 mg (has no administration in time range)  lactulose (CHRONULAC) 10 GM/15ML solution 10 g (has no administration in time range)  metoCLOPramide (REGLAN) tablet 5 mg (has no administration in time range)  pantoprazole (PROTONIX) EC tablet  40 mg (has no administration in time range)  polyethylene glycol (MIRALAX / GLYCOLAX) packet 17 g (has no administration in time range)  senna-docusate (Senokot-S) tablet 2 tablet (has no administration in time range)  sevelamer carbonate (RENVELA) tablet 800 mg (has no administration in time range)  apixaban (ELIQUIS) tablet 2.5 mg (has no administration in time range)  fluorometholone (FML) 0.1 % ophthalmic suspension 1 drop (has no administration in time range)  lidocaine (LIDODERM) 5 % 1 patch (has no administration in time range)  insulin aspart (novoLOG) injection 0-6 Units (has no administration in time range)  acetaminophen (TYLENOL) tablet 650 mg (has no administration in time range)    Or  acetaminophen (TYLENOL) suppository 650 mg (has no administration in time range)  fentaNYL (SUBLIMAZE) injection 50 mcg (50 mcg Intravenous Given 10/27/21 0109)      Note:  This document was prepared using Dragon voice recognition software and may include unintentional dictation errors.  Nanda Quinton, MD, [facility] Emergency Medicine    Namir Neto, [facility], MD 10/27/21 [phone]

## 2021-10-28 DIAGNOSIS — N186 End stage renal disease: Secondary | ICD-10-CM | POA: Diagnosis not present

## 2021-10-28 DIAGNOSIS — G9340 Encephalopathy, unspecified: Secondary | ICD-10-CM | POA: Diagnosis not present

## 2021-10-28 DIAGNOSIS — G893 Neoplasm related pain (acute) (chronic): Secondary | ICD-10-CM | POA: Diagnosis not present

## 2021-10-28 DIAGNOSIS — M545 Low back pain, unspecified: Secondary | ICD-10-CM | POA: Diagnosis not present

## 2021-10-28 DIAGNOSIS — Z66 Do not resuscitate: Secondary | ICD-10-CM

## 2021-10-28 DIAGNOSIS — R531 Weakness: Secondary | ICD-10-CM

## 2021-10-28 DIAGNOSIS — R5383 Other fatigue: Secondary | ICD-10-CM

## 2021-10-28 LAB — GLUCOSE, CAPILLARY
Glucose-Capillary: 61 mg/dL — ABNORMAL LOW (ref 70–99)
Glucose-Capillary: 104 mg/dL — ABNORMAL HIGH (ref 70–99)
Glucose-Capillary: 105 mg/dL — ABNORMAL HIGH (ref 70–99)
Glucose-Capillary: 121 mg/dL — ABNORMAL HIGH (ref 70–99)
Glucose-Capillary: 126 mg/dL — ABNORMAL HIGH (ref 70–99)

## 2021-10-28 LAB — BASIC METABOLIC PANEL
Anion gap: 14 (ref 5–15)
BUN: 54 mg/dL — ABNORMAL HIGH (ref 8–23)
CO2: 24 mmol/L (ref 22–32)
Calcium: 9.2 mg/dL (ref 8.9–10.3)
Chloride: 97 mmol/L — ABNORMAL LOW (ref 98–111)
Creatinine, Ser: 5.06 mg/dL — ABNORMAL HIGH (ref 0.44–1.00)
GFR, Estimated: 8 mL/min — ABNORMAL LOW (ref >60)
Glucose, Bld: 72 mg/dL (ref 70–99)
Potassium: 4.6 mmol/L (ref 3.5–5.1)
Sodium: 135 mmol/L (ref 135–145)

## 2021-10-28 LAB — CBC
HCT: 26.3 % — ABNORMAL LOW (ref 36.0–46.0)
Hemoglobin: 8.4 g/dL — ABNORMAL LOW (ref 12.0–15.0)
MCH: 31.9 pg (ref 26.0–34.0)
MCHC: 31.9 g/dL (ref 30.0–36.0)
MCV: 100 fL (ref 80.0–100.0)
Platelets: 193 10*3/uL (ref 150–400)
RBC: 2.63 MIL/uL — ABNORMAL LOW (ref 3.87–5.11)
RDW: 16.7 % — ABNORMAL HIGH (ref 11.5–15.5)
WBC: 12 10*3/uL — ABNORMAL HIGH (ref 4.0–10.5)
nRBC: 0 % (ref 0.0–0.2)

## 2021-10-28 LAB — HEPATITIS B SURFACE ANTIBODY, QUANTITATIVE: Hep B S AB Quant (Post): <3.1 m[IU]/mL — ABNORMAL LOW (ref >9.9)

## 2021-10-28 MED ORDER — LORAZEPAM 2 MG/ML IJ SOLN: 1.0000 mg | INTRAMUSCULAR | Status: DC | PRN

## 2021-10-28 MED ORDER — GLYCOPYRROLATE 0.2 MG/ML IJ SOLN: 0.2000 mg | INTRAMUSCULAR | Status: DC | PRN

## 2021-10-28 MED ORDER — OXYCODONE HCL 5 MG PO TABS: 10.0000 mg | ORAL_TABLET | Freq: Four times a day (QID) | ORAL | Status: DC | PRN

## 2021-10-28 MED ORDER — POLYETHYLENE GLYCOL 3350 17 G PO PACK: 17.0000 g | PACK | Freq: Every day | ORAL | Status: DC | PRN

## 2021-10-28 MED ORDER — NEPRO/CARBSTEADY PO LIQD: 237.0000 mL | Freq: Two times a day (BID) | ORAL | Status: DC

## 2021-10-28 MED ORDER — RENA-VITE PO TABS: 1.0000 | ORAL_TABLET | Freq: Every day | ORAL | Status: DC

## 2021-10-28 MED ORDER — ONDANSETRON 4 MG PO TBDP
4.0000 mg | ORAL_TABLET | Freq: Four times a day (QID) | ORAL | Status: DC | PRN
  Administered 2021-10-30: 4 mg via ORAL
  Filled 2021-10-28: qty 1

## 2021-10-28 MED ORDER — LORAZEPAM 1 MG PO TABS: 1.0000 mg | ORAL_TABLET | ORAL | Status: DC | PRN

## 2021-10-28 MED ORDER — DEXAMETHASONE 4 MG PO TABS
4.0000 mg | ORAL_TABLET | Freq: Every day | ORAL | Status: DC
  Administered 2021-10-29 – 2021-10-31 (×3): 4 mg via ORAL
  Filled 2021-10-28 (×4): qty 1

## 2021-10-28 MED ORDER — HYDROMORPHONE HCL 2 MG PO TABS
2.0000 mg | ORAL_TABLET | ORAL | Status: DC | PRN
  Administered 2021-10-28 – 2021-10-29 (×5): 4 mg via ORAL
  Filled 2021-10-28 (×5): qty 2

## 2021-10-28 MED ORDER — HYDROMORPHONE HCL 1 MG/ML IJ SOLN: 0.5000 mg | INTRAMUSCULAR | Status: DC | PRN

## 2021-10-28 MED ORDER — HALOPERIDOL LACTATE 2 MG/ML PO CONC
2.0000 mg | Freq: Four times a day (QID) | ORAL | Status: DC | PRN
  Filled 2021-10-28: qty 1

## 2021-10-28 MED ORDER — HALOPERIDOL 1 MG PO TABS
2.0000 mg | ORAL_TABLET | Freq: Four times a day (QID) | ORAL | Status: DC | PRN
  Filled 2021-10-28: qty 2

## 2021-10-28 MED ORDER — HALOPERIDOL LACTATE 5 MG/ML IJ SOLN: 2.0000 mg | Freq: Four times a day (QID) | INTRAMUSCULAR | Status: DC | PRN

## 2021-10-28 MED ORDER — OXYCODONE HCL 5 MG PO TABS: 10.0000 mg | ORAL_TABLET | ORAL | Status: DC | PRN

## 2021-10-28 MED ORDER — BIOTENE DRY MOUTH MT LIQD
15.0000 mL | Freq: Two times a day (BID) | OROMUCOSAL | Status: DC
  Administered 2021-10-28 – 2021-10-31 (×5): 15 mL via TOPICAL

## 2021-10-28 MED ORDER — LORAZEPAM 2 MG/ML PO CONC: 1.0000 mg | ORAL | Status: DC | PRN

## 2021-10-28 MED ORDER — ONDANSETRON HCL 4 MG/2ML IJ SOLN: 4.0000 mg | Freq: Four times a day (QID) | INTRAMUSCULAR | Status: DC | PRN

## 2021-10-28 MED ORDER — NEPRO/CARBSTEADY PO LIQD: 237.0000 mL | ORAL | Status: DC | PRN

## 2021-10-28 MED ORDER — GLYCOPYRROLATE 1 MG PO TABS
1.0000 mg | ORAL_TABLET | ORAL | Status: DC | PRN
  Filled 2021-10-28: qty 1

## 2021-10-28 MED ORDER — POLYVINYL ALCOHOL 1.4 % OP SOLN
1.0000 [drp] | Freq: Four times a day (QID) | OPHTHALMIC | Status: DC | PRN
  Filled 2021-10-28: qty 15

## 2021-10-28 NOTE — Progress Notes (Signed)
I met with her and son °They are in agreement for transition to comfort measures, no treatment or dialysis °I will sign off °

## 2021-10-28 NOTE — Progress Notes (Signed)
Patient c/o pain when moving, refused meds at this time to allow comfort and not be disturbed.

## 2021-10-28 NOTE — Progress Notes (Signed)
Hypoglycemic Event  CBG: 61  Treatment: 4 oz juice/soda  Symptoms: None  Follow-up CBG: [id number] CBG Result:104  Possible Reasons for Event: Inadequate meal intake  Comments/MD notified:Dr. Patsi Sears

## 2021-10-28 NOTE — Progress Notes (Signed)
Initial Nutrition Assessment  DOCUMENTATION CODES:   Severe malnutrition in context of chronic illness  INTERVENTION:  -Nepro Shake po BID, each supplement provides 425 kcal and 19 grams protein -renal mvi daily  NUTRITION DIAGNOSIS:   Severe Malnutrition related to chronic illness (ESRD on HD) as evidenced by severe fat depletion, severe muscle depletion.  GOAL:   Patient will meet greater than or equal to 90% of their needs  MONITOR:   PO intake, Supplement acceptance, Labs, Weight trends, I & O's, Skin  REASON FOR ASSESSMENT:   Malnutrition Screening Tool    ASSESSMENT:   88 year old female with past medical history of metastatic multiple myeloma, ESRD on dialysis, type 2 diabetes, OSA, sick sinus syndrome with pacemaker, chronic cancer-related pain who presented to the emergency room with confusion, back pain.  Patient was brought by her son due to progressive confusion.  There is also report of new sacral wound.  On presentation she was confused but hemodynamically stable.  Nephrology consulted for dialysis.  Palliative care consult for goals of care because of her extremely poor prognosis and advanced age.  Oncology also following.  Pt provided no responses to RD questions. Per RN, there is a family meeting planned for today to discuss [place]. RD recommendations will be dependent upon the results of this discussion.   PO Intake: 0-10%   No UOP documented x24 hours I/O: +17m since admit  Last HD 10/27/21 w/ 047mnet UF Post-HD wt 56.2 kg (wt not updated since) Admit wt 62 kg EDW 59.5 kg per Nephrology  Medications: Scheduled Meds:  acyclovir  400 mg Oral Daily   allopurinol  50 mg Oral Daily   apixaban  2.5 mg Oral BID   Chlorhexidine Gluconate Cloth  6 each Topical Q0600   dexamethasone  2 mg Oral Q breakfast   insulin aspart  0-6 Units Subcutaneous TID WC   lactulose  10 g Oral TID   lidocaine  1 patch Transdermal Daily   metoCLOPramide  5 mg Oral TID AC    midodrine  10 mg Oral Q T,Th,Sa-HD   polyethylene glycol  17 g Oral Daily   senna-docusate  2 tablet Oral BID   sevelamer carbonate  800 mg Oral TID WC   Labs: Recent Labs  Lab 10/27/21 0125 10/27/21 0616 10/27/21 1532 10/28/21 0219  NA 132* 132*  --  135  K 5.3* 5.8*  --  4.6  CL 93* 92*  --  97*  CO2 23 22  --  24  BUN 74* 80*  --  54*  CREATININE 6.50* 6.61*  --  5.06*  CALCIUM 9.6 9.7  --  9.2  MG 2.8*  --   --   --   PHOS  --   --  6.9*  --   GLUCOSE 91 88  --  72  CBGs: 61-105 x24 hours   NUTRITION - FOCUSED PHYSICAL EXAM:  Flowsheet Row Most Recent Value  Orbital Region Moderate depletion  Upper Arm Region Severe depletion  Thoracic and Lumbar Region Unable to assess  Buccal Region Severe depletion  Temple Region Moderate depletion  Clavicle Bone Region Severe depletion  Clavicle and Acromion Bone Region Severe depletion  Scapular Bone Region Moderate depletion  Dorsal Hand Severe depletion  Patellar Region Mild depletion  Anterior Thigh Region Moderate depletion  Posterior Calf Region Moderate depletion  Edema (RD Assessment) Mild  Hair Reviewed  Eyes Reviewed  Mouth Unable to assess  Skin Other (Comment)  [bruised]  Nails  Other (Comment)  [thick vertical ridges]       Diet Order:   Diet Order             Diet renal with fluid restriction Fluid restriction: 1200 mL Fluid; Room service appropriate? Yes; Fluid consistency: Thin  Diet effective now                   EDUCATION NEEDS:   Not appropriate for education at this time  Skin:  Skin Assessment: Skin Integrity Issues: Skin Integrity Issues:: Stage II Stage II: buttocks  Last BM:  PTA  Height:   Ht Readings from Last 1 Encounters:  10/27/21 5' 5"  (1.651 m)    Weight:   Wt Readings from Last 1 Encounters:  10/27/21 56.2 kg    BMI:  Body mass index is 20.62 kg/m.  Estimated Nutritional Needs:   Kcal:  1700-1900  Protein:  85-95 grams  Fluid:  1L+UOP     Theone Stanley., MS, RD, LDN (she/her/hers) RD pager number and weekend/on-call pager number located in [place].

## 2021-10-28 NOTE — Progress Notes (Signed)
PROGRESS NOTE    [name]  WPY:[id number] DOB: 02-19-1930 DOA: 10/26/2021 PCP: Lucianne Lei, MD   Chief Complain: Confusion, back pain  Brief Narrative:  Patient is a 88 year old female with past medical history of metastatic multiple myeloma, ESRD on dialysis, type 2 diabetes, OSA, sick sinus syndrome with pacemaker, chronic cancer-related pain who presented to the emergency room with confusion, back pain.  Patient was brought by her son due to progressive confusion.  There is also report of new sacral wound.  On presentation she was confused but hemodynamically stable.  Nephrology consulted for dialysis.  Palliative care consult for goals of care because of her extremely poor prognosis and advanced age.  Oncology also following.Family meeting today.  Assessment & Plan:   Principal Problem:   Acute encephalopathy Active Problems:   Obstructive sleep apnea   ESRD (end stage renal disease) on dialysis (HCC)   Cancer associated pain   Pressure injury of skin   Atypical atrial flutter (HCC)   Metastatic multiple myeloma to bone (HCC)   Acute encephalopathy: Unclear etiology.  Could be secondary to polypharmacy from pain medication, uremia.  Ammonia level normal.  She is not hypoxic or hypercarbic.  Today she is alert and oriented. CT head did not show any acute intracranial abnormalities.  Metastatic multiple myeloma: Follows with Dr. Alvy Bimler.  Recent lab are concerning for relapse.  CT imaging done in the emergency department showed widespread lytic lesions, large destructive lesion involving T1 and C7 with paraspinal soft tissue mass.  Oncology not planning for continued chemotherapy due to her poor performance status. On dexamethasone  ESRD on dialysis: Nephrology consulted.  If she enrolls in hospice or comfort care, dialysis would be stopped.  Chronic pain syndrome: We will be judicious with sedatives and opioids in this elderly female who came with confusion..  Continue  supportive care.  History of atrial flutter: Currently rate is controlled.  On Eliquis.  Monitor on telemetry.  Status post pacemaker placement  Type 2 diabetes: Has poor oral intake.  Recent hemoglobin A1c of 4.6.  Was taking glimepiride at home which will be discontinued on discharge.  Monitor blood sugars.  Hospital course remarkable for episodes of hypoglycemia.  Normocytic anemia: Secondary to chronic disease, history of malignancy.  Hemoglobin in the range of 8-9  Leukocytosis: Likely from steroids.  Continue to monitor.  Improving  Constipation: Continue bowel regimen  Goals of care: Extremely elderly patient, significantly deconditioned, now has metastatic multiple myeloma and also on dialysis.  She is still full code.  I think she will be candidate for hospice.  Palliative care consulted, family meeting today PT/OT consulted, recommending skilled nursing facility.  Pressure Injury 10/27/21 Buttocks Stage 2 -  Partial thickness loss of dermis presenting as a shallow open injury with a red, pink wound bed without slough. (Active)  10/27/21 1530  Location: Buttocks  Location Orientation:   Staging: Stage 2 -  Partial thickness loss of dermis presenting as a shallow open injury with a red, pink wound bed without slough.  Wound Description (Comments):   Present on Admission: Yes               DVT prophylaxis:Eliquis Code Status: Full Family Communication:: Discussed with son on phone on 10/27/2021 Patient status:  Dispo: The patient is from: Home              Anticipated d/c is to: Home versus skilled nursing facility  Anticipated d/c date is: Not sure, depends upon family meeting today  Consultants: Oncology,Nephrology  Procedures:None  Antimicrobials:  Anti-infectives (From admission, onward)    Start     Dose/Rate Route Frequency Ordered Stop   10/27/21 1000  acyclovir (ZOVIRAX) tablet 400 mg        400 mg Oral Daily 10/27/21 0325          Subjective: Patient seen and examined at the bedside this morning.  Hemodynamically stable.  Her mental status has improved and she is currently alert and oriented.  Complains of back pain.  Goals of care discussed with patient at the bedside and she wants to continue aggressive management for her problems  Objective: Vitals:   10/27/21 1839 10/27/21 1842 10/27/21 2100 10/28/21 0508  BP: (!) 123/55 (!) 126/56 (!) 101/52 (!) 120/49  Pulse: 73 72 70 70  Resp:   18 17  Temp: 97.6 F (36.4 C)  99.2 F (37.3 C) 99 F (37.2 C)  TempSrc:   Oral   SpO2:   100% 98%  Weight:  56.2 kg    Height:        Intake/Output Summary (Last 24 hours) at 10/28/2021 0814 Last data filed at 10/27/2021 1842 Gross per 24 hour  Intake --  Output 0 ml  Net 0 ml   Filed Weights   10/27/21 1228 10/27/21 1540 10/27/21 1842  Weight: 55.8 kg 56.1 kg 56.2 kg    Examination:    General exam: Very deconditioned, chronically ill looking, weak  HEENT: PERRL Respiratory system:  no wheezes or crackles  Cardiovascular system: S1 & S2 heard, RRR.  Pacemaker Gastrointestinal system: Abdomen is nondistended, soft and nontender. Central nervous system: Alert and oriented Extremities: No edema, no clubbing ,no cyanosis, dialysis access of the right upper extremity Skin: No rashes, no ulcers,no icterus     Data Reviewed: I have personally reviewed following labs and imaging studies  CBC: Recent Labs  Lab 10/24/21 1234 10/27/21 0125 10/27/21 0616 10/28/21 0219  WBC 8.8 15.8* 14.1* 12.0*  NEUTROABS 6.8 13.4*  --   --   HGB 9.4* 9.7* 8.9* 8.4*  HCT 28.9* 30.1* 27.6* 26.3*  MCV 99.0 101.0* 101.1* 100.0  PLT 225 199 199 706   Basic Metabolic Panel: Recent Labs  Lab 10/24/21 1234 10/27/21 0125 10/27/21 0616 10/27/21 1532 10/28/21 0219  NA 133* 132* 132*  --  135  K 4.4 5.3* 5.8*  --  4.6  CL 94* 93* 92*  --  97*  CO2 24 23 22   --  24  GLUCOSE 116* 91 88  --  72  BUN 65* 74* 80*  --  54*   CREATININE 5.90* 6.50* 6.61*  --  5.06*  CALCIUM 9.4 9.6 9.7  --  9.2  MG  --  2.8*  --   --   --   PHOS  --   --   --  6.9*  --    GFR: Estimated Creatinine Clearance: 6.7 mL/min (A) (by C-G formula based on SCr of 5.06 mg/dL (H)). Liver Function Tests: Recent Labs  Lab 10/24/21 1234 10/27/21 0125  AST 19 29  ALT 14 19  ALKPHOS 43 39  BILITOT 0.3 0.6  PROT 7.7 7.6  ALBUMIN 3.6 3.0*   No results for input(s): LIPASE, AMYLASE in the last 168 hours. Recent Labs  Lab 10/27/21 0612  AMMONIA 19   Coagulation Profile: No results for input(s): INR, PROTIME in the last 168 hours. Cardiac Enzymes: No  results for input(s): CKTOTAL, CKMB, CKMBINDEX, TROPONINI in the last 168 hours. BNP (last 3 results) No results for input(s): PROBNP in the last 8760 hours. HbA1C: Recent Labs    10/27/21 0615  HGBA1C 5.2   CBG: Recent Labs  Lab 10/27/21 0815 10/27/21 1305 10/27/21 1902 10/27/21 2123 10/28/21 0637  GLUCAP 83 86 89 82 61*   Lipid Profile: No results for input(s): CHOL, HDL, LDLCALC, TRIG, CHOLHDL, LDLDIRECT in the last 72 hours. Thyroid Function Tests: Recent Labs    10/27/21 0616  TSH 1.682   Anemia Panel: Recent Labs    10/27/21 0616  VITAMINB12 533   Sepsis Labs: No results for input(s): PROCALCITON, LATICACIDVEN in the last 168 hours.  Recent Results (from the past 240 hour(s))  Resp Panel by RT-PCR (Flu A&B, Covid) Nasopharyngeal Swab     Status: None   Collection Time: 10/27/21 12:33 AM   Specimen: Nasopharyngeal Swab; Nasopharyngeal(NP) swabs in vial transport medium  Result Value Ref Range Status   SARS Coronavirus 2 by RT PCR NEGATIVE NEGATIVE Final    Comment: (NOTE) SARS-CoV-2 target nucleic acids are NOT DETECTED.  The SARS-CoV-2 RNA is generally detectable in upper respiratory specimens during the acute phase of infection. The lowest concentration of SARS-CoV-2 viral copies this assay can detect is 138 copies/mL. A negative result does not  preclude SARS-Cov-2 infection and should not be used as the sole basis for treatment or other patient management decisions. A negative result may occur with  improper specimen collection/handling, submission of specimen other than nasopharyngeal swab, presence of viral mutation(s) within the areas targeted by this assay, and inadequate number of viral copies(<138 copies/mL). A negative result must be combined with clinical observations, patient history, and epidemiological information. The expected result is Negative.  Fact Sheet for Patients:  EntrepreneurPulse.com.au  Fact Sheet for Healthcare Providers:  IncredibleEmployment.be  This test is no t yet approved or cleared by the [place] FDA and  has been authorized for detection and/or diagnosis of SARS-CoV-2 by FDA under an Emergency Use Authorization (EUA). This EUA will remain  in effect (meaning this test can be used) for the duration of the COVID-19 declaration under Section 564(b)(1) of the Act, 21 U.S.C.section 360bbb-3(b)(1), unless the authorization is terminated  or revoked sooner.       Influenza A by PCR NEGATIVE NEGATIVE Final   Influenza B by PCR NEGATIVE NEGATIVE Final    Comment: (NOTE) The Xpert Xpress SARS-CoV-2/FLU/RSV plus assay is intended as an aid in the diagnosis of influenza from Nasopharyngeal swab specimens and should not be used as a sole basis for treatment. Nasal washings and aspirates are unacceptable for Xpert Xpress SARS-CoV-2/FLU/RSV testing.  Fact Sheet for Patients: EntrepreneurPulse.com.au  Fact Sheet for Healthcare Providers: IncredibleEmployment.be  This test is not yet approved or cleared by the [place] FDA and has been authorized for detection and/or diagnosis of SARS-CoV-2 by FDA under an Emergency Use Authorization (EUA). This EUA will remain in effect (meaning this test can be used) for the  duration of the COVID-19 declaration under Section 564(b)(1) of the Act, 21 U.S.C. section 360bbb-3(b)(1), unless the authorization is terminated or revoked.  Performed at [facility], [place] [address] [place], [place] [address]   MRSA Next Gen by PCR, Nasal     Status: None   Collection Time: 10/27/21  2:58 PM   Specimen: Nasal Mucosa; Nasal Swab  Result Value Ref Range Status   MRSA by PCR Next Gen NOT DETECTED NOT  DETECTED Final    Comment: (NOTE) The GeneXpert MRSA Assay (FDA approved for NASAL specimens only), is one component of a comprehensive MRSA colonization surveillance program. It is not intended to diagnose MRSA infection nor to guide or monitor treatment for MRSA infections. Test performance is not FDA approved in patients less than 63 years old. Performed at [facility], [place] [address] [place], [place] [address]          Radiology Studies: CT Head Wo Contrast  Result Date: 10/27/2021 CLINICAL DATA:  Mental status changes EXAM: CT HEAD WITHOUT CONTRAST TECHNIQUE: Contiguous axial images were obtained from the base of the skull through the vertex without intravenous contrast. RADIATION DOSE REDUCTION: This exam was performed according to the departmental dose-optimization program which includes automated exposure control, adjustment of the mA and/or kV according to patient size and/or use of iterative reconstruction technique. COMPARISON:  03/21/2020 FINDINGS: Brain: There is atrophy and chronic small vessel disease changes. No acute intracranial abnormality. Specifically, no hemorrhage, hydrocephalus, mass lesion, acute infarction, or significant intracranial injury. Vascular: No hyperdense vessel or unexpected calcification. Skull: No acute calvarial abnormality. Sinuses/Orbits: No acute findings Other: None IMPRESSION: Atrophy, chronic microvascular disease. No acute intracranial abnormality. Electronically Signed   By: Rolm Baptise M.D.   On: 10/27/2021  03:38   CT CHEST ABDOMEN PELVIS WO CONTRAST  Result Date: 10/27/2021 CLINICAL DATA:  Trauma. EXAM: CT CHEST, ABDOMEN AND PELVIS WITHOUT CONTRAST TECHNIQUE: Multidetector CT imaging of the chest, abdomen and pelvis was performed following the standard protocol without IV contrast. RADIATION DOSE REDUCTION: This exam was performed according to the departmental dose-optimization program which includes automated exposure control, adjustment of the mA and/or kV according to patient size and/or use of iterative reconstruction technique. COMPARISON:  Thoracic and lumbar spine CT dated 10/11/2021 and chest CT dated 09/10/2020. FINDINGS: Evaluation of this exam is limited in the absence of intravenous contrast. CT CHEST FINDINGS Cardiovascular: Moderate cardiomegaly. No pericardial effusion. Advanced 3 vessel coronary vascular calcification. There is severe atherosclerotic calcification of the thoracic aorta. No aneurysmal dilatation. Mild dilatation of the main pulmonary trunk suggestive of pulmonary hypertension. Mediastinum/Nodes: Right hilar calcified granuloma. No adenopathy. The esophagus is grossly unremarkable. No mediastinal fluid collection. Lungs/Pleura: Bibasilar linear atelectasis/scarring. No lobar consolidation, pleural effusion, pneumothorax. The central airways are patent. Musculoskeletal: Left pectoral pacemaker device. Osteopenia with degenerative changes of the spine. There is a 3.7 x 3.9 cm paraspinal mass with infiltration of the left first and second ribs at the costovertebral junction as well as destructive changes of the T1 and probably C7 most consistent with malignancy or metastatic disease. There is severe degenerative changes of the shoulders. Fluid density adjacent to the left shoulder, likely effusion. Additional smaller scattered bone lucencies suspicious for metastatic disease. No definite acute osseous pathology. CT ABDOMEN PELVIS FINDINGS No intra-abdominal free air or free fluid.  Hepatobiliary: The liver is unremarkable. No intrahepatic biliary dilatation. There is a stone within the gallbladder. No pericholecystic fluid or evidence of acute cholecystitis by CT. Pancreas: Unremarkable. No pancreatic ductal dilatation or surrounding inflammatory changes. Spleen: Normal in size without focal abnormality. Adrenals/Urinary Tract: The adrenal glands unremarkable. Moderate bilateral renal parenchyma atrophy. A 1 cm hypodense lesion from the lateral interpolar left kidney is not characterized on this CT but likely represents a cyst. There is no hydronephrosis or nephrolithiasis on either side. The visualized ureters and urinary bladder appear unremarkable. Stomach/Bowel: Large amount of stool throughout the colon. There is no bowel obstruction or active  inflammation. Vascular/Lymphatic: Advanced aortoiliac atherosclerotic disease. The IVC is unremarkable. No portal venous gas. There is no adenopathy. Reproductive: Hysterectomy. Other: None Musculoskeletal: Severe osteopenia and degenerative changes. Diffuse osseous lucencies suspicious for metastatic disease. L4-L5 disc spacer. No obvious acute fracture. IMPRESSION: 1. No acute traumatic injury in the chest, abdomen, or pelvis. 2. Left paraspinal mass with infiltration of the left first and second ribs at the costovertebral junction as well as destructive changes of the T1 and probably C7 ribs most consistent with malignancy or metastatic disease. Diffuse osseous lucencies suspicious for metastatic disease. 3. Cholelithiasis. 4. Large amount of stool throughout the colon. No bowel obstruction. 5. Aortic Atherosclerosis (ICD10-I70.0). Electronically Signed   By: Anner Crete M.D.   On: 10/27/2021 01:02        Scheduled Meds:  acyclovir  400 mg Oral Daily   allopurinol  50 mg Oral Daily   apixaban  2.5 mg Oral BID   Chlorhexidine Gluconate Cloth  6 each Topical Q0600   dexamethasone  2 mg Oral Q breakfast   insulin aspart  0-6 Units  Subcutaneous TID WC   lactulose  10 g Oral TID   lidocaine  1 patch Transdermal Daily   metoCLOPramide  5 mg Oral TID AC   midodrine  10 mg Oral Q T,Th,Sa-HD   polyethylene glycol  17 g Oral Daily   senna-docusate  2 tablet Oral BID   sevelamer carbonate  800 mg Oral TID WC   Continuous Infusions:   LOS: 0 days    Time spent: 35 mins.More than 50% of that time was spent in counseling and/or coordination of care.      Shelly Coss, MD [facility] P1/13/2023, 8:14 AM

## 2021-10-28 NOTE — Care Management Obs Status (Signed)
[facility] NOTIFICATION   Patient Details  Name: [name] MRN: [id number] Date of Birth: 04/25/1930   Medicare Observation Status Notification Given:  Yes    Tom-Johnson, Renea Ee, RN 10/28/2021, 2:25 PM

## 2021-10-28 NOTE — Progress Notes (Signed)
Subjective: Seen in room ,complains of back pain, dialysis last night does not remember much of it, recognizes me from outpatient dialysis at [facility] this past Wednesday on rounds  Objective Vital signs in last 24 hours: Vitals:   10/27/21 1842 10/27/21 2100 10/28/21 0508 10/28/21 0855  BP: (!) 126/56 (!) 101/52 (!) 120/49 (!) 109/49  Pulse: 72 70 70 81  Resp:  _0 Temp:  99.2 F (37.3 C) 99 F (37.2 C) 97.9 F (36.6 C)  TempSrc:  Oral    SpO2:  100% 98% 99%  Weight: 56.2 kg     Height:       Weight change: -6.2 kg  Physical Exam: General: Thin frail elderly AAF NAD Heart: RRR no MRG Lungs: CTA, nonlabored breathing Abdomen: NABS, soft, NTND Extremiti no pedal edema es:  Dialysis Access: R UA aVF positive Bruit    Home meds include - zyloprim, eliquis 2.5 bid, celexa, amaryl, lactulose 10gm tid, reglan 5 tid, midodrine 10 per hd tts, nepro bid, protonix, oxy IR prn, renvela 800 tid ac, prns/ supps/ vits    OP HD: TTS HD   4h  350/500  59.5kg  2/2.5 bath  Hep 2000  R AVF  - mircera 100ug q2, last 12/15  Problem/Plan: AMS -improving appears almost baseline now   Work-up per admit team. Post HD confusion =mostly due to pt getting/ needing several percocet's to be given prior to HD by the son for her to be able to get through (^'ing back pain) the treatment.  Noted being seen by palliative and oncology noted with her myeloma more treatment appears "unrealistic he recommends hospice, to meet today at 5 PM with son's poor prognosis" ESRD - on HD x 2 years. TTS HD.  Currently on schedule next dialysis would be tomorrow but will hold orders until hospice palliative needs with family today Multiple myeloma - sig relapse w/ spinal involvement and progressing back pain.  Noted oncology recommended hospice DM2 Anemia ckd -Hgb 8.4 not giving esa most likely due to malignancy MBD ckd - no vdra, Ca in range, will add on phos lab to determine binder Prognosis - very concerning w/  her sig back pain / bony mets affecting her ability to do dialysis. Also pt is frail /hospice consult yesterday noted "unrealistic for more treatment for further chemoRx.  Recommends hospice"  He will meet with family today .  Ernest Haber, PA-C [facility] [phone] 10/28/2021,2:33 PM  LOS: 0 days   Labs: Basic Metabolic Panel: Recent Labs  Lab 10/27/21 0125 10/27/21 0616 10/27/21 1532 10/28/21 0219  NA 132* 132*  --  135  K 5.3* 5.8*  --  4.6  CL 93* 92*  --  97*  CO2 23 22  --  24  GLUCOSE 91 88  --  72  BUN 74* 80*  --  54*  CREATININE 6.50* 6.61*  --  5.06*  CALCIUM 9.6 9.7  --  9.2  PHOS  --   --  6.9*  --    Liver Function Tests: Recent Labs  Lab 10/24/21 1234 10/27/21 0125  AST 19 29  ALT 14 19  ALKPHOS 43 39  BILITOT 0.3 0.6  PROT 7.7 7.6  ALBUMIN 3.6 3.0*   No results for input(s): LIPASE, AMYLASE in the last 168 hours. Recent Labs  Lab 10/27/21 0612  AMMONIA 19   CBC: Recent Labs  Lab 10/24/21 1234 10/27/21 0125 10/27/21 0616 10/28/21 0219  WBC 8.8 15.8*  14.1* 12.0*  NEUTROABS 6.8 13.4*  --   --   HGB 9.4* 9.7* 8.9* 8.4*  HCT 28.9* 30.1* 27.6* 26.3*  MCV 99.0 101.0* 101.1* 100.0  PLT 225 199 199 193   Cardiac Enzymes: No results for input(s): CKTOTAL, CKMB, CKMBINDEX, TROPONINI in the last 168 hours. CBG: Recent Labs  Lab 10/27/21 1902 10/27/21 2123 10/28/21 0637 10/28/21 0853 10/28/21 1134  GLUCAP 89 82 61* 104* 105*    Studies/Results: CT Head Wo Contrast  Result Date: 10/27/2021 CLINICAL DATA:  Mental status changes EXAM: CT HEAD WITHOUT CONTRAST TECHNIQUE: Contiguous axial images were obtained from the base of the skull through the vertex without intravenous contrast. RADIATION DOSE REDUCTION: This exam was performed according to the departmental dose-optimization program which includes automated exposure control, adjustment of the mA and/or kV according to patient size and/or use of iterative reconstruction  technique. COMPARISON:  03/21/2020 FINDINGS: Brain: There is atrophy and chronic small vessel disease changes. No acute intracranial abnormality. Specifically, no hemorrhage, hydrocephalus, mass lesion, acute infarction, or significant intracranial injury. Vascular: No hyperdense vessel or unexpected calcification. Skull: No acute calvarial abnormality. Sinuses/Orbits: No acute findings Other: None IMPRESSION: Atrophy, chronic microvascular disease. No acute intracranial abnormality. Electronically Signed   By: Rolm Baptise M.D.   On: 10/27/2021 03:38   CT CHEST ABDOMEN PELVIS WO CONTRAST  Result Date: 10/27/2021 CLINICAL DATA:  Trauma. EXAM: CT CHEST, ABDOMEN AND PELVIS WITHOUT CONTRAST TECHNIQUE: Multidetector CT imaging of the chest, abdomen and pelvis was performed following the standard protocol without IV contrast. RADIATION DOSE REDUCTION: This exam was performed according to the departmental dose-optimization program which includes automated exposure control, adjustment of the mA and/or kV according to patient size and/or use of iterative reconstruction technique. COMPARISON:  Thoracic and lumbar spine CT dated 10/11/2021 and chest CT dated 09/10/2020. FINDINGS: Evaluation of this exam is limited in the absence of intravenous contrast. CT CHEST FINDINGS Cardiovascular: Moderate cardiomegaly. No pericardial effusion. Advanced 3 vessel coronary vascular calcification. There is severe atherosclerotic calcification of the thoracic aorta. No aneurysmal dilatation. Mild dilatation of the main pulmonary trunk suggestive of pulmonary hypertension. Mediastinum/Nodes: Right hilar calcified granuloma. No adenopathy. The esophagus is grossly unremarkable. No mediastinal fluid collection. Lungs/Pleura: Bibasilar linear atelectasis/scarring. No lobar consolidation, pleural effusion, pneumothorax. The central airways are patent. Musculoskeletal: Left pectoral pacemaker device. Osteopenia with degenerative changes of  the spine. There is a 3.7 x 3.9 cm paraspinal mass with infiltration of the left first and second ribs at the costovertebral junction as well as destructive changes of the T1 and probably C7 most consistent with malignancy or metastatic disease. There is severe degenerative changes of the shoulders. Fluid density adjacent to the left shoulder, likely effusion. Additional smaller scattered bone lucencies suspicious for metastatic disease. No definite acute osseous pathology. CT ABDOMEN PELVIS FINDINGS No intra-abdominal free air or free fluid. Hepatobiliary: The liver is unremarkable. No intrahepatic biliary dilatation. There is a stone within the gallbladder. No pericholecystic fluid or evidence of acute cholecystitis by CT. Pancreas: Unremarkable. No pancreatic ductal dilatation or surrounding inflammatory changes. Spleen: Normal in size without focal abnormality. Adrenals/Urinary Tract: The adrenal glands unremarkable. Moderate bilateral renal parenchyma atrophy. A 1 cm hypodense lesion from the lateral interpolar left kidney is not characterized on this CT but likely represents a cyst. There is no hydronephrosis or nephrolithiasis on either side. The visualized ureters and urinary bladder appear unremarkable. Stomach/Bowel: Large amount of stool throughout the colon. There is no bowel obstruction or active inflammation. Vascular/Lymphatic:  Advanced aortoiliac atherosclerotic disease. The IVC is unremarkable. No portal venous gas. There is no adenopathy. Reproductive: Hysterectomy. Other: None Musculoskeletal: Severe osteopenia and degenerative changes. Diffuse osseous lucencies suspicious for metastatic disease. L4-L5 disc spacer. No obvious acute fracture. IMPRESSION: 1. No acute traumatic injury in the chest, abdomen, or pelvis. 2. Left paraspinal mass with infiltration of the left first and second ribs at the costovertebral junction as well as destructive changes of the T1 and probably C7 ribs most consistent  with malignancy or metastatic disease. Diffuse osseous lucencies suspicious for metastatic disease. 3. Cholelithiasis. 4. Large amount of stool throughout the colon. No bowel obstruction. 5. Aortic Atherosclerosis (ICD10-I70.0). Electronically Signed   By: Anner Crete M.D.   On: 10/27/2021 01:02   Medications:   acyclovir  400 mg Oral Daily   allopurinol  50 mg Oral Daily   apixaban  2.5 mg Oral BID   Chlorhexidine Gluconate Cloth  6 each Topical Q0600   dexamethasone  2 mg Oral Q breakfast   insulin aspart  0-6 Units Subcutaneous TID WC   lactulose  10 g Oral TID   lidocaine  1 patch Transdermal Daily   metoCLOPramide  5 mg Oral TID AC   midodrine  10 mg Oral Q T,Th,Sa-HD   polyethylene glycol  17 g Oral Daily   senna-docusate  2 tablet Oral BID   sevelamer carbonate  800 mg Oral TID WC

## 2021-10-28 NOTE — TOC Progression Note (Signed)
Transition of Care [facility]) - Initial/Assessment Note    Patient Details  Name: [name] MRN: [id number] Date of Birth: 02/03/1930  Transition of Care [facility]) CM/SW Contact:    Milinda Antis, [place] Phone Number: 10/28/2021, 9:55 AM  Clinical Narrative:                 CSW reviewed patient's chart.  The family has a meeting with Palliative later today to discuss [place].  CSW will continue to follow for d/c needs.        Patient Goals and CMS Choice        Expected Discharge Plan and Services                                                Prior Living Arrangements/Services                       Activities of Daily Living Home Assistive Devices/Equipment: Eyeglasses ADL Screening (condition at time of admission) Patient's cognitive ability adequate to safely complete daily activities?: Yes Is the patient deaf or have difficulty hearing?: Yes Does the patient have difficulty seeing, even when wearing glasses/contacts?: No Does the patient have difficulty concentrating, remembering, or making decisions?: Yes Patient able to express need for assistance with ADLs?: Yes Does the patient have difficulty dressing or bathing?: Yes Independently performs ADLs?: No Communication: Independent Dressing (OT): Needs assistance Is this a change from baseline?: Pre-admission baseline Grooming: Needs assistance Is this a change from baseline?: Pre-admission baseline Feeding: Independent Bathing: Needs assistance Is this a change from baseline?: Pre-admission baseline Toileting: Needs assistance Is this a change from baseline?: Pre-admission baseline In/Out Bed: Needs assistance Is this a change from baseline?: Pre-admission baseline Walks in Home: Needs assistance Is this a change from baseline?: Pre-admission baseline Does the patient have difficulty walking or climbing stairs?: Yes Weakness of Legs: Both Weakness of Arms/Hands: Both  Permission  Sought/Granted                  Emotional Assessment              Admission diagnosis:  Somnolence [R40.0] Acute encephalopathy [G93.40] Wound of sacral region, initial encounter [S31.000A] Acute midline low back pain without sciatica [M54.50] Patient Active Problem List   Diagnosis Date Noted   Acute encephalopathy 10/27/2021   Sacral wound    Ambulatory dysfunction 10/11/2021   Metastatic multiple myeloma to bone ([place]) 10/11/2021   Protein-calorie malnutrition, severe 12/31/2020   Atrial flutter ([place]) 12/31/2020   Fluid overload 12/30/2020   Weight loss, non-intentional 12/10/2020   Pain due to onychomycosis of toenails of both feet 12/08/2020   Type 2 diabetes mellitus with vascular disease ([place]) 12/08/2020   Persistent atrial fibrillation ([place]) 11/08/2020   Gastritis 10/27/2020   Delayed gastric emptying 10/27/2020   Atypical atrial flutter ([place]) 10/25/2020   Secondary hypercoagulable state ([place]) 10/25/2020   Pulmonary edema 09/10/2020   Pleural effusion, bilateral 09/10/2020   Acute respiratory failure with hypoxia ([place]) 04/02/2020   Elevated troponin 04/02/2020   Hypotension 03/23/2020   Left hip pain 01/12/2020   Vitamin B12 deficiency 10/20/2019   Deficiency anemia 09/26/2019   Anaphylactic shock, unspecified, sequela 32/09/2481   Complication of vascular dialysis catheter 07/21/2019   Peripheral neuropathy due to chemotherapy ([place]) 07/04/2019   Encounter for removal of sutures  07/03/2019   Goals of care, counseling/discussion 05/30/2019   Pressure injury of skin 05/26/2019   Yeast infection of the skin 04/04/2019   Cancer associated pain 03/26/2019   Moderate protein-calorie malnutrition ([place]) 03/24/2019   Nausea in adult 03/14/2019   ESRD (end stage renal disease) on dialysis ([place]) 03/14/2019   Other constipation 03/14/2019   Coagulation defect, unspecified ([place]) 03/13/2019   Pancytopenia, acquired ([place]) 03/12/2019   Anemia in chronic kidney  disease 03/11/2019   Bradycardia, unspecified 03/11/2019   Iron deficiency anemia, unspecified 03/11/2019   Secondary hyperparathyroidism of renal origin ([place]) 03/11/2019   Multiple myeloma not having achieved remission ([place]) 03/03/2019   Hypokalemia 02/25/2019   Anemia    Shingles 03/09/2015   Sick sinus syndrome ([place]) 02/03/2015   Shortness of breath 02/01/2014   Fatigue 02/01/2014   BENIGN PAROXYSMAL POSITIONAL VERTIGO 04/06/2010   BRADYCARDIA 01/27/2009   Diabetes mellitus ([place]) 02/24/2008   Obstructive sleep apnea 02/24/2008   PERIODIC LIMB MOVEMENT DISORDER 02/24/2008   ALLERGIC RHINITIS 02/24/2008   CAROTID BRUIT 02/24/2008   PCP:  Lucianne Lei, MD Pharmacy:   [id number] [place]Phone: [phone]: [phone] [address] [place]Phone: [phone]: [phone]    Social Determinants of Health (SDOH) Interventions    Readmission Risk Interventions Readmission Risk Prevention Plan 04/04/2020 03/03/2019  Transportation Screening Complete Complete  PCP or Specialist Appt within 3-5 Days - Not Complete  Not Complete comments - not yet ready for d/c  [place]RI or [place]- Not Complete  HRI or Home Care Consult comments - no needs at this time  Social Work Consult for [place]/Counseling - Not Complete  SW consult not completed comments - no needs at this time  Palliative Care Screening - Not Applicable  Medication Review ([place] Complete Complete  PCP or Specialist appointment within 3-5 days of discharge (No Data) -  [facility] Home Care Consult Complete -  SW Recovery Care/Counseling Consult Complete -  Palliative Care Screening Not Applicable -  [place]ark City[place] Applicable -  Some recent data might be hidden

## 2021-10-28 NOTE — Final Progress Note (Signed)
Refused cpap.

## 2021-10-28 NOTE — Progress Notes (Signed)
Daily Progress Note   Patient Name: [name]       Date: 10/28/2021 DOB: [birth year]  Age: 88 y.o. MRN#: [id number] Attending Physician: Shelly Coss, MD Primary Care Physician: Lucianne Lei, MD Admit Date: 10/26/2021  Reason for Consultation/Follow-up: Establishing goals of care, "Very elderly patient with history of multiple myeloma presented with confusion.  She is also on dialysis.  I think she is a candidate for hospice care.  We need goals of care.  She is still full code."  Subjective: Chart review performed. Received report from primary RN - no acute concerns. Patient has poor oral intake, is lethargic, and is refusing medications.  4:00 PM Went to patient's bedside for scheduled meeting with son/Jacques. Patient was lying in bed asleep - she does wake to voice/gentle touch. She is oriented to self only and not able to make complex medical decisions. No signs or non-verbal gestures of pain or discomfort noted unless she moves. She denies pain unless moving, then states her pain is "bad" in her back - this is her most concerning symptom. No respiratory distress, increased work of breathing, or secretions noted. Her dinner tray is at bedside - she is not interested in eating, stating she's "not hungry." She denies nausea.   Met with son/Jacques in person and his two sisters, Remo Lipps and Hassan Rowan, via speakerphone  to discuss diagnosis, prognosis, GOC, EOL wishes, disposition, and options.  I introduced Palliative Medicine as specialized medical care for people living with serious illness. It focuses on providing relief from the symptoms and stress of a serious illness. The goal is to improve quality of life for both the patient and the family.  We discussed patient's current illness and  what it means in the larger context of patient's on-going co-morbidities.  Natural disease trajectory and expectations at EOL were discussed. I attempted to elicit values and goals of care important to the patient. The difference between aggressive medical intervention and comfort care was considered in light of the patient's goals of care. Family understand that, unfortunately, patient is not able to tolerate any further chemotherapy due to her frail state. They understand that Oncology has recommended hospice care. Son attempted to explain situation to patient and allow her to make her own decisions, but she was unable to answer his complex medical questions. Patient  also was intermittently asleep during visit today - slept majority of visit.  Reviewed with family that patient is not able to make appropriate decisions for herself at this time and family would likely need to make decisions for her - family expressed understanding.  [facility] had discussed home hospice services to family. Family were not aware of residential hospice option. Reviewed the difference between home vs residential hospice. Family would prefer patient return home and they feel comfortable caring for patient at EOL - they are able to provide her with 24/7 supervision/assistance with hospice support. They would like to use [facility]. Family would like patient discharged after DME is delivered to the home.  We talked about transition to comfort measures in house and what that would entail inclusive of medications to control pain, dyspnea, agitation, nausea, and itching. We discussed stopping all unnecessary measures such as blood draws, needle sticks, oxygen, antibiotics, CBGs/insulin, cardiac monitoring, IVF, and frequent vital signs. Prognostication reviewed with stopping HD and without stopping HD at this time. After discussion, family are agreeable to no further HD treatments and initiating full comfort today - they  would like to continue CBG/insulin at this time.  Reviewed current EOL [place] visitation policy - family expressed understanding. They do have grandchildren under 43 they would like to visit with patient. Education provided on the widespread prevalence of respiratory viruses such as RSV and flu - family understand risk.  Medical recommendation was given for DNR/DNI in light of the patient's current medical condition with education provided - family were agreeable to DNR/DNI with understanding that patient would not receive CPR, defibrillation, ACLS medications, or intubation.   Discussed with patient/family the importance of continued conversation with each other and the medical providers regarding overall plan of care and treatment options, ensuring decisions are within the context of the patients values and GOCs.    Questions and concerns were addressed. The patient/family was encouraged to call with questions and/or concerns. PMT card was provided.  Reviewed EOL visitation policy with RN for visitors under 12.  Length of Stay: 0  Current Medications: Scheduled Meds:   acyclovir  400 mg Oral Daily   allopurinol  50 mg Oral Daily   apixaban  2.5 mg Oral BID   Chlorhexidine Gluconate Cloth  6 each Topical Q0600   dexamethasone  2 mg Oral Q breakfast   insulin aspart  0-6 Units Subcutaneous TID WC   lactulose  10 g Oral TID   lidocaine  1 patch Transdermal Daily   metoCLOPramide  5 mg Oral TID AC   midodrine  10 mg Oral Q T,Th,Sa-HD   polyethylene glycol  17 g Oral Daily   senna-docusate  2 tablet Oral BID   sevelamer carbonate  800 mg Oral TID WC    Continuous Infusions:   PRN Meds: acetaminophen **OR** acetaminophen, fluorometholone, HYDROmorphone (DILAUDID) injection, oxyCODONE, pantoprazole  Physical Exam Vitals and nursing note reviewed.  Constitutional:      General: She is not in acute distress.    Appearance: She is ill-appearing.  Pulmonary:     Effort: No  respiratory distress.  Skin:    General: Skin is warm and dry.  Neurological:     Mental Status: She is lethargic, disoriented and confused.     Motor: Weakness present.  Psychiatric:        Behavior: Behavior is cooperative.        Cognition and Memory: Cognition is impaired. Memory is impaired.  Vital Signs: BP (!) 109/49 (BP Location: Left Arm)    Pulse 81    Temp 97.9 F (36.6 C)    Resp 17    Ht 5' 5"  (1.651 m)    Wt 56.2 kg    SpO2 99%    BMI 20.62 kg/m  SpO2: SpO2: 99 % O2 Device: O2 Device: Room Air O2 Flow Rate:    Intake/output summary:  Intake/Output Summary (Last 24 hours) at 10/28/2021 1531 Last data filed at 10/28/2021 1300 Gross per 24 hour  Intake 180 ml  Output 0 ml  Net 180 ml   LBM: Last BM Date:  (PTA) Baseline Weight: Weight: 62 kg Most recent weight: Weight: 56.2 kg       Palliative Assessment/Data: PPS 10-20%    Flowsheet Rows    Flowsheet Row Most Recent Value  Intake Tab   Referral Department Hospitalist  Unit at Time of Referral ER  Palliative Care Primary Diagnosis Cancer  Date Notified 10/27/21  Palliative Care Type New Palliative care  Reason for referral Clarify Goals of Care  Date of Admission 10/26/21  Date first seen by Palliative Care 10/27/21  # of days Palliative referral response time 0 Day(s)  # of days IP prior to Palliative referral 1  Clinical Assessment   Psychosocial & Spiritual Assessment   Palliative Care Outcomes   Patient/Family meeting held? Yes  Who was at the meeting? son - brief phone call  - will meet in person tomorrow  Palliative Care Outcomes Clarified goals of care       Patient Active Problem List   Diagnosis Date Noted   Acute encephalopathy 10/27/2021   Sacral wound    Ambulatory dysfunction 10/11/2021   Metastatic multiple myeloma to bone ([place]) 10/11/2021   Protein-calorie malnutrition, severe 12/31/2020   Atrial flutter ([place]) 12/31/2020   Fluid overload 12/30/2020   Weight loss,  non-intentional 12/10/2020   Pain due to onychomycosis of toenails of both feet 12/08/2020   Type 2 diabetes mellitus with vascular disease ([place]) 12/08/2020   Persistent atrial fibrillation ([place]) 11/08/2020   Gastritis 10/27/2020   Delayed gastric emptying 10/27/2020   Atypical atrial flutter ([place]) 10/25/2020   Secondary hypercoagulable state ([place]) 10/25/2020   Pulmonary edema 09/10/2020   Pleural effusion, bilateral 09/10/2020   Acute respiratory failure with hypoxia (HCC) 04/02/2020   Elevated troponin 04/02/2020   Hypotension 03/23/2020   Left hip pain 01/12/2020   Vitamin B12 deficiency 10/20/2019   Deficiency anemia 09/26/2019   Anaphylactic shock, unspecified, sequela 45/80/9983   Complication of vascular dialysis catheter 07/21/2019   Peripheral neuropathy due to chemotherapy ([place]) 07/04/2019   Encounter for removal of sutures 07/03/2019   Goals of care, counseling/discussion 05/30/2019   Pressure injury of skin 05/26/2019   Yeast infection of the skin 04/04/2019   Cancer associated pain 03/26/2019   Moderate protein-calorie malnutrition ([place]) 03/24/2019   Nausea in adult 03/14/2019   ESRD (end stage renal disease) on dialysis ([place]) 03/14/2019   Other constipation 03/14/2019   Coagulation defect, unspecified ([place]) 03/13/2019   Pancytopenia, acquired ([place]) 03/12/2019   Anemia in chronic kidney disease 03/11/2019   Bradycardia, unspecified 03/11/2019   Iron deficiency anemia, unspecified 03/11/2019   Secondary hyperparathyroidism of renal origin ([place]) 03/11/2019   Multiple myeloma not having achieved remission ([place]) 03/03/2019   Hypokalemia 02/25/2019   Anemia    Shingles 03/09/2015   Sick sinus syndrome ([place]) 02/03/2015   Shortness of breath 02/01/2014   Fatigue 02/01/2014   BENIGN PAROXYSMAL  POSITIONAL VERTIGO 04/06/2010   BRADYCARDIA 01/27/2009   Diabetes mellitus ([place]) 02/24/2008   Obstructive sleep apnea 02/24/2008   PERIODIC LIMB MOVEMENT DISORDER 02/24/2008    ALLERGIC RHINITIS 02/24/2008   CAROTID BRUIT 02/24/2008    Palliative Care Assessment & Plan   Patient Profile: 88 y.o. female  with past medical history of ESRD on hemodialysis, type 2 diabetes mellitus, OSA, sick sinus syndrome with pacer, multiple myeloma, and chronic cancer-related pain presented to ED on 10/26/21 from home with son's concerns that patient was experiencing several days of progressive confusion, increased back pain, and new sacral wound. Patient was  admitted on 10/26/2021 with acute encephalopathy, multiple myeloma not achieving remission, ESRD on HD, chronic pain, pressure injury. Patient is followed by [facility] who has seen patient in house and does not feel patient can tolerate any further chemotherapy due to her frail status - she recommends hospice care. Nephrology was also consulted and feels if patient is no longer able to tolerate chemotherapy, it is unlikely they will be able to continue dialysis.  Assessment: Acute encephalopathy Mestastatic multiple myeloma ESRD on HD Chronic pain syndrome DM2  Recommendations/Plan: Initiated full comfort measures Now DNR/DNI - durable DNR form completed and will be placed in shadow chart. Copy was made and will be scanned into Vynca/ACP tab Family's goal is for patient to discharge home with hospice. They are able to provide the 24/7 supervision/assistance patient will need Family are ok with discharge as soon as DME is delivered - son is to call tonight to schedule TOC consulted for: home hospice referral, family request [facility] Added orders for EOL symptom management and to reflect full comfort measures, as well as discontinued orders that were not focused on comfort Continue palliative wound care Unrestricted visitation orders were placed per current [place] EOL visitation policy  Nursing to provide frequent assessments and administer PRN medications as clinically necessary to ensure EOL  comfort PMT will continue to follow and support holistically  Symptom Management: Dilaudid 2-93m PO q2hr PRN severe pain/dyspnea - transitioned from IV in anticipation of d/c home Discontinued oxycodone and minimized other mediations Continue lidocaine patch Increased dexamethazone from 2253mto 53m253maily Continue CBG/insulin per family's request Robinul PRN excessive secretions Haldol PRN agitation/delerium Ativan PRN anxiety, seizure, sedation, distress Zofran PRN nausea Liquifilm tears PRN dry eyes Oral rinse BID  Goals of Care and Additional Recommendations: Limitations on Scope of Treatment: Full Comfort Care  Code Status:    Code Status Orders  (From admission, onward)           Start     Ordered   10/27/21 0325  Full code  Continuous        10/27/21 0325           Code Status History     Date Active Date Inactive Code Status Order ID Comments User Context   10/11/2021 1533 10/14/2021 1957 DNR [id number] BongoD ED   12/30/2020 2118 01/07/2021 2143 Full Code [id number] QuillO ED   09/11/2020 0724 09/12/2020 1909 Full Code [id number] DadaD Inpatient   04/02/2020 0924 04/04/2020 2122 Full Code [id number] [id number] ED   05/25/2019 2131 05/26/2019 2030 Full Code [id number] PublicD ED   02/25/2019 1358 03/11/2019 2007 Full Code [id number] ShellD ED   02/25/2019 1300 02/25/2019 1358 Full Code [id number] ShellD ED  05/16/2016 1510 05/16/2016 2008 Full Code [id number]  Thompson Grayer, MD Inpatient   03/09/2015 2011 03/10/2015 2036 Full Code [id number]  Mendel Corning, MD Inpatient      Advance Directive Documentation    [place] Most Recent Value  Type of Advance Directive Out of facility DNR (pink MOST or yellow form)  Pre-existing out of facility DNR order (yellow form or pink MOST form) Pink MOST form placed in chart (order not valid for inpatient use)  "MOST" Form  in Place? --       Prognosis:  < 2 weeks  Discharge Planning: Home with Hospice  Care plan was discussed with patient, patient's family, Dr. Tawanna Solo, Dr. Gorsuch/Oncology, Dr. Zeyfang/Nephrology  Thank you for allowing the Palliative Medicine Team to assist in the care of this patient.   Total Time 90 minutes Prolonged Time Billed  yes       Greater than 50%  of this time was spent counseling and coordinating care related to the above assessment and plan.  Lin Landsman, NP  Please contact Palliative Medicine Team phone at [phone] for questions and concerns.

## 2021-10-29 DIAGNOSIS — G9340 Encephalopathy, unspecified: Secondary | ICD-10-CM | POA: Diagnosis not present

## 2021-10-29 DIAGNOSIS — L89152 Pressure ulcer of sacral region, stage 2: Secondary | ICD-10-CM | POA: Diagnosis present

## 2021-10-29 DIAGNOSIS — I959 Hypotension, unspecified: Secondary | ICD-10-CM | POA: Diagnosis present

## 2021-10-29 DIAGNOSIS — I120 Hypertensive chronic kidney disease with stage 5 chronic kidney disease or end stage renal disease: Secondary | ICD-10-CM | POA: Diagnosis present

## 2021-10-29 DIAGNOSIS — Z992 Dependence on renal dialysis: Secondary | ICD-10-CM | POA: Diagnosis not present

## 2021-10-29 DIAGNOSIS — Z515 Encounter for palliative care: Secondary | ICD-10-CM | POA: Diagnosis not present

## 2021-10-29 DIAGNOSIS — M48061 Spinal stenosis, lumbar region without neurogenic claudication: Secondary | ICD-10-CM | POA: Diagnosis present

## 2021-10-29 DIAGNOSIS — Z20822 Contact with and (suspected) exposure to covid-19: Secondary | ICD-10-CM | POA: Diagnosis present

## 2021-10-29 DIAGNOSIS — Z66 Do not resuscitate: Secondary | ICD-10-CM | POA: Diagnosis not present

## 2021-10-29 DIAGNOSIS — K219 Gastro-esophageal reflux disease without esophagitis: Secondary | ICD-10-CM | POA: Diagnosis present

## 2021-10-29 DIAGNOSIS — H409 Unspecified glaucoma: Secondary | ICD-10-CM | POA: Diagnosis present

## 2021-10-29 DIAGNOSIS — C9000 Multiple myeloma not having achieved remission: Secondary | ICD-10-CM | POA: Diagnosis present

## 2021-10-29 DIAGNOSIS — I484 Atypical atrial flutter: Secondary | ICD-10-CM | POA: Diagnosis present

## 2021-10-29 DIAGNOSIS — E1152 Type 2 diabetes mellitus with diabetic peripheral angiopathy with gangrene: Secondary | ICD-10-CM | POA: Diagnosis present

## 2021-10-29 DIAGNOSIS — G4733 Obstructive sleep apnea (adult) (pediatric): Secondary | ICD-10-CM | POA: Diagnosis present

## 2021-10-29 DIAGNOSIS — I495 Sick sinus syndrome: Secondary | ICD-10-CM | POA: Diagnosis present

## 2021-10-29 DIAGNOSIS — R400 Somnolence: Secondary | ICD-10-CM | POA: Diagnosis present

## 2021-10-29 DIAGNOSIS — M545 Low back pain, unspecified: Secondary | ICD-10-CM | POA: Diagnosis not present

## 2021-10-29 DIAGNOSIS — E1122 Type 2 diabetes mellitus with diabetic chronic kidney disease: Secondary | ICD-10-CM | POA: Diagnosis present

## 2021-10-29 DIAGNOSIS — E11649 Type 2 diabetes mellitus with hypoglycemia without coma: Secondary | ICD-10-CM | POA: Diagnosis not present

## 2021-10-29 DIAGNOSIS — N186 End stage renal disease: Secondary | ICD-10-CM | POA: Diagnosis present

## 2021-10-29 DIAGNOSIS — M109 Gout, unspecified: Secondary | ICD-10-CM | POA: Diagnosis present

## 2021-10-29 DIAGNOSIS — G928 Other toxic encephalopathy: Secondary | ICD-10-CM | POA: Diagnosis present

## 2021-10-29 DIAGNOSIS — Z8261 Family history of arthritis: Secondary | ICD-10-CM | POA: Diagnosis not present

## 2021-10-29 DIAGNOSIS — D631 Anemia in chronic kidney disease: Secondary | ICD-10-CM | POA: Diagnosis present

## 2021-10-29 DIAGNOSIS — M1990 Unspecified osteoarthritis, unspecified site: Secondary | ICD-10-CM | POA: Diagnosis present

## 2021-10-29 DIAGNOSIS — D539 Nutritional anemia, unspecified: Secondary | ICD-10-CM | POA: Diagnosis present

## 2021-10-29 DIAGNOSIS — G893 Neoplasm related pain (acute) (chronic): Secondary | ICD-10-CM | POA: Diagnosis not present

## 2021-10-29 LAB — MULTIPLE MYELOMA PANEL, SERUM
Albumin SerPl Elph-Mcnc: 3.7 g/dL (ref 2.9–4.4)
Albumin/Glob SerPl: 1.1 (ref 0.7–1.7)
Alpha 1: 0.2 g/dL (ref 0.0–0.4)
Alpha2 Glob SerPl Elph-Mcnc: 0.8 g/dL (ref 0.4–1.0)
B-Globulin SerPl Elph-Mcnc: 2.4 g/dL — ABNORMAL HIGH (ref 0.7–1.3)
Gamma Glob SerPl Elph-Mcnc: 0.1 g/dL — ABNORMAL LOW (ref 0.4–1.8)
Globulin, Total: 3.6 g/dL (ref 2.2–3.9)
IgA: 20 mg/dL — ABNORMAL LOW (ref 64–422)
IgG (Immunoglobin G), Serum: 1941 mg/dL — ABNORMAL HIGH (ref 586–1602)
IgM (Immunoglobulin M), Srm: 12 mg/dL — ABNORMAL LOW (ref 26–217)
M Protein SerPl Elph-Mcnc: 1.7 g/dL — ABNORMAL HIGH
Total Protein ELP: 7.3 g/dL (ref 6.0–8.5)

## 2021-10-29 LAB — GLUCOSE, CAPILLARY
Glucose-Capillary: 81 mg/dL (ref 70–99)
Glucose-Capillary: 113 mg/dL — ABNORMAL HIGH (ref 70–99)
Glucose-Capillary: 148 mg/dL — ABNORMAL HIGH (ref 70–99)
Glucose-Capillary: 154 mg/dL — ABNORMAL HIGH (ref 70–99)

## 2021-10-29 MED ORDER — BISACODYL 10 MG RE SUPP
10.0000 mg | Freq: Once | RECTAL | Status: AC
  Administered 2021-10-29: 10 mg via RECTAL
  Filled 2021-10-29: qty 1

## 2021-10-29 MED ORDER — HYDROMORPHONE HCL ER 8 MG PO TB24
16.0000 mg | ORAL_TABLET | ORAL | Status: DC
  Administered 2021-10-29 – 2021-10-30 (×2): 16 mg via ORAL
  Filled 2021-10-29 (×2): qty 2

## 2021-10-29 MED ORDER — HYDROMORPHONE HCL 1 MG/ML IJ SOLN
1.0000 mg | INTRAMUSCULAR | Status: AC
  Administered 2021-10-29: 1 mg via INTRAVENOUS
  Filled 2021-10-29: qty 1

## 2021-10-29 MED ORDER — HYDROMORPHONE HCL 2 MG PO TABS
4.0000 mg | ORAL_TABLET | ORAL | Status: DC | PRN
  Administered 2021-10-29 – 2021-10-31 (×8): 6 mg via ORAL
  Filled 2021-10-29 (×8): qty 3

## 2021-10-29 MED ORDER — POLYETHYLENE GLYCOL 3350 17 G PO PACK
17.0000 g | PACK | Freq: Every day | ORAL | Status: DC
  Administered 2021-10-29 – 2021-10-31 (×3): 17 g via ORAL
  Filled 2021-10-29 (×3): qty 1

## 2021-10-29 MED ORDER — DIPHENHYDRAMINE HCL 25 MG PO CAPS
25.0000 mg | ORAL_CAPSULE | Freq: Four times a day (QID) | ORAL | Status: DC | PRN
  Administered 2021-10-29 – 2021-10-30 (×2): 25 mg via ORAL
  Filled 2021-10-29 (×2): qty 1

## 2021-10-29 MED ORDER — FLEET ENEMA 7-19 GM/118ML RE ENEM
1.0000 | ENEMA | Freq: Once | RECTAL | Status: AC
  Administered 2021-10-29: 1 via RECTAL
  Filled 2021-10-29: qty 1

## 2021-10-29 MED ORDER — HYDROMORPHONE HCL ER 12 MG PO TB24: 12.0000 mg | ORAL_TABLET | ORAL | Status: DC

## 2021-10-29 NOTE — TOC Progression Note (Signed)
Transition of Care [facility]) - Progression Note    Patient Details  Name: [name] MRN: [id number] Date of Birth: Sep 09, 1930  Transition of [facility]) CM/SW Contact  Bartholomew Crews, RN Phone Number: [phone] 10/29/2021, 12:27 PM  Clinical Narrative:     Damaris Schooner with [name] on his cell phone to discuss hospice choice. Email sent of hospice agencies for [phone]. [name] to discuss with his sisters and will call CM back.   Expected Discharge Plan: Home w Hospice Care Barriers to Discharge: Other (must enter comment) (working on home hospice arrangements)  Expected Discharge Plan and Services Expected Discharge Plan: Home w Hospice Care In-house Referral: Hospice / Palliative Care Discharge Planning Services: CM Consult Post Acute Care Choice: Hospice Living arrangements for the past 2 months: Single Family Home                                       Social Determinants of Health (SDOH) Interventions    Readmission Risk Interventions Readmission Risk Prevention Plan 04/04/2020 03/03/2019  Transportation Screening Complete Complete  PCP or Specialist Appt within 3-5 Days - Not Complete  Not Complete comments - not yet ready for d/c  HRI or [place] - Not Complete  HRI or Home Care Consult comments - no needs at this time  Social Work Consult for [place] Planning/Counseling - Not Complete  SW consult not completed comments - no needs at this time  Palliative Care Screening - Not Applicable  Medication Review (RN Care Manager) Complete Complete  PCP or Specialist appointment within 3-5 days of discharge (No Data) -  [place] or Home Care Consult Complete -  SW Recovery Care/Counseling Consult Complete -  Palliative Care Screening Not Applicable -  [place] Not Applicable -  Some recent data might be hidden

## 2021-10-29 NOTE — TOC Initial Note (Signed)
Transition of Care [facility]) - Initial/Assessment Note    Patient Details  Name: [name] MRN: [id number] Date of Birth: [birth year]  Transition of Care [facility]) CM/SW Contact:    Bartholomew Crews, RN Phone Number: [phone] 10/29/2021, 8:55 AM  Clinical Narrative:                  Spoke with patient's son, [name], on his cell phone to discuss post acute transition. [place] through [facility] provides casemanagement services. [name] stated that [facility] is a call center that he can call, and that he has been working with an NP, Enid Derry, who had started some discussion about hospice and needed DME, like hospital bed.   [name] to contact [place] and will return call to CM. Also disucssed choice of hospice agencies from medicare.gov list. [name] to further discuss with follow up call.   Demographics verified.   TOC following for transition needs.   Expected Discharge Plan: Home w Hospice Care Barriers to Discharge: Other (must enter comment) (working on home hospice arrangements)   Patient Goals and CMS Choice Patient states their goals for this hospitalization and ongoing recovery are:: home with hospice and family support CMS Medicare.gov Compare Post Acute Care list provided to:: Patient Represenative (must comment) (son, [name]) Choice offered to / list presented to : Adult Children (son, [name])  Expected Discharge Plan and Services Expected Discharge Plan: Home w Hospice Care In-house Referral: Hospice / Palliative Care Discharge Planning Services: CM Consult Post Acute Care Choice: Hospice Living arrangements for the past 2 months: Single Family Home                                      Prior Living Arrangements/Services Living arrangements for the past 2 months: Single Family Home Lives with:: Self, Adult Children          Need for Family Participation in Patient Care: Yes (Comment) Care giver support system in place?: Yes  (comment) Current home services: DME Criminal Activity/Legal Involvement Pertinent to Current Situation/Hospitalization: No - Comment as needed  Activities of Daily Living Home Assistive Devices/Equipment: Eyeglasses ADL Screening (condition at time of admission) Patient's cognitive ability adequate to safely complete daily activities?: Yes Is the patient deaf or have difficulty hearing?: Yes Does the patient have difficulty seeing, even when wearing glasses/contacts?: No Does the patient have difficulty concentrating, remembering, or making decisions?: Yes Patient able to express need for assistance with ADLs?: Yes Does the patient have difficulty dressing or bathing?: Yes Independently performs ADLs?: No Communication: Independent Dressing (OT): Needs assistance Is this a change from baseline?: Pre-admission baseline Grooming: Needs assistance Is this a change from baseline?: Pre-admission baseline Feeding: Independent Bathing: Needs assistance Is this a change from baseline?: Pre-admission baseline Toileting: Needs assistance Is this a change from baseline?: Pre-admission baseline In/Out Bed: Needs assistance Is this a change from baseline?: Pre-admission baseline Walks in Home: Needs assistance Is this a change from baseline?: Pre-admission baseline Does the patient have difficulty walking or climbing stairs?: Yes Weakness of Legs: Both Weakness of Arms/Hands: Both  Permission Sought/Granted                  Emotional Assessment         Alcohol / Substance Use: Not Applicable Psych Involvement: No (comment)  Admission diagnosis:  Somnolence [R40.0] Acute encephalopathy [G93.40] Wound of sacral region, initial encounter [S31.000A] Acute  midline low back pain without sciatica [M54.50] Patient Active Problem List   Diagnosis Date Noted   Acute encephalopathy 10/27/2021   Sacral wound    Ambulatory dysfunction 10/11/2021   Metastatic multiple myeloma to bone  ([place]) 10/11/2021   Protein-calorie malnutrition, severe 12/31/2020   Atrial flutter ([place]) 12/31/2020   Fluid overload 12/30/2020   Weight loss, non-intentional 12/10/2020   Pain due to onychomycosis of toenails of both feet 12/08/2020   Type 2 diabetes mellitus with vascular disease ([place]) 12/08/2020   Persistent atrial fibrillation ([place]) 11/08/2020   Gastritis 10/27/2020   Delayed gastric emptying 10/27/2020   Atypical atrial flutter ([place]) 10/25/2020   Secondary hypercoagulable state ([place]) 10/25/2020   Pulmonary edema 09/10/2020   Pleural effusion, bilateral 09/10/2020   Acute respiratory failure with hypoxia (HCC) 04/02/2020   Elevated troponin 04/02/2020   Hypotension 03/23/2020   Left hip pain 01/12/2020   Vitamin B12 deficiency 10/20/2019   Deficiency anemia 09/26/2019   Anaphylactic shock, unspecified, sequela 81/10/7508   Complication of vascular dialysis catheter 07/21/2019   Peripheral neuropathy due to chemotherapy ([place]) 07/04/2019   Encounter for removal of sutures 07/03/2019   Goals of care, counseling/discussion 05/30/2019   Pressure injury of skin 05/26/2019   Yeast infection of the skin 04/04/2019   Cancer associated pain 03/26/2019   Moderate protein-calorie malnutrition ([place]) 03/24/2019   Nausea in adult 03/14/2019   ESRD (end stage renal disease) on dialysis ([place]) 03/14/2019   Other constipation 03/14/2019   Coagulation defect, unspecified ([place]) 03/13/2019   Pancytopenia, acquired ([place]) 03/12/2019   Anemia in chronic kidney disease 03/11/2019   Bradycardia, unspecified 03/11/2019   Iron deficiency anemia, unspecified 03/11/2019   Secondary hyperparathyroidism of renal origin ([place]) 03/11/2019   Multiple myeloma not having achieved remission ([place]) 03/03/2019   Hypokalemia 02/25/2019   Anemia    Shingles 03/09/2015   Sick sinus syndrome ([place]) 02/03/2015   Shortness of breath 02/01/2014   Fatigue 02/01/2014   BENIGN PAROXYSMAL POSITIONAL VERTIGO 04/06/2010    BRADYCARDIA 01/27/2009   Diabetes mellitus ([place]) 02/24/2008   Obstructive sleep apnea 02/24/2008   PERIODIC LIMB MOVEMENT DISORDER 02/24/2008   ALLERGIC RHINITIS 02/24/2008   CAROTID BRUIT 02/24/2008   PCP:  Lucianne Lei, MD Pharmacy:   [id number] [place]Phone: [phone]: [phone] [facility] [address]Phone: [phone]: [phone]    Social Determinants of Health (SDOH) Interventions    Readmission Risk Interventions Readmission Risk Prevention Plan 04/04/2020 03/03/2019  Transportation Screening Complete Complete  PCP or Specialist Appt within 3-5 Days - Not Complete  Not Complete comments - not yet ready for d/c  [place] or [place]- Not Complete  HRI or Home Care Consult comments - no needs at this time  Social Work Consult for [place]/Counseling - Not Complete  SW consult not completed comments - no needs at this time  Palliative Care Screening - Not Applicable  Medication Review ([place] Complete Complete  PCP or Specialist appointment within 3-5 days of discharge (No Data) -  [facility] [facility] Consult Complete -  SW Recovery Care/Counseling Consult Complete -  Palliative Care Screening Not Applicable -  [place] -  Some recent data might be hidden

## 2021-10-29 NOTE — TOC Progression Note (Addendum)
Transition of Care [facility]) - Progression Note    Patient Details  Name: [name] MRN: [id number] Date of Birth: Jun 26, 1930  Transition of Care [facility]) CM/SW Contact  Bartholomew Crews, RN Phone Number: [phone] 10/29/2021, 1:35 PM  Clinical Narrative:     Spoke with patients family at the bedside. Provide choice for hospice agencies. Discussed the referral process and DME. Family (son, [name], and granddaughter, Erasmo Downer) requested about an hour to review list and choose an agency. Will follow up with family and make referral.   UPDATE 1500: Spoke with Erasmo Downer at bedside (patient's DIL, granddaughter, and great granddaughter) present. 3 additional family members pending arrival who want to be part of decision making process. Anticipate decision for agency choice later this evening.   Expected Discharge Plan: Home w Hospice Care Barriers to Discharge: Other (must enter comment) (working on home hospice arrangements)  Expected Discharge Plan and Services Expected Discharge Plan: Home w Hospice Care In-house Referral: Hospice / Palliative Care Discharge Planning Services: CM Consult Post Acute Care Choice: Hospice Living arrangements for the past 2 months: Single Family Home                                       Social Determinants of Health (SDOH) Interventions    Readmission Risk Interventions Readmission Risk Prevention Plan 04/04/2020 03/03/2019  Transportation Screening Complete Complete  PCP or Specialist Appt within 3-5 Days - Not Complete  Not Complete comments - not yet ready for d/c  HRI or [place] - Not Complete  HRI or Home Care Consult comments - no needs at this time  Social Work Consult for [place] Planning/Counseling - Not Complete  SW consult not completed comments - no needs at this time  Palliative Care Screening - Not Applicable  Medication Review (RN Care Manager) Complete Complete  PCP or Specialist appointment within 3-5 days  of discharge (No Data) -  [place] or Home Care Consult Complete -  SW Recovery Care/Counseling Consult Complete -  Palliative Care Screening Not Applicable -  [place] Not Applicable -  Some recent data might be hidden

## 2021-10-29 NOTE — Progress Notes (Signed)
PROGRESS NOTE    [name]  LNL:[id number] DOB: 11/23/1929 DOA: 10/26/2021 PCP: Lucianne Lei, MD   Chief Complain: Confusion, back pain  Brief Narrative:  Patient is a 88 year old female with past medical history of metastatic multiple myeloma, ESRD on dialysis, type 2 diabetes, OSA, sick sinus syndrome with pacemaker, chronic cancer-related pain who presented to the emergency room with confusion, back pain.  Patient was brought by her son due to progressive confusion.  There is also report of new sacral wound.  On presentation she was confused but hemodynamically stable.  Nephrology consulted for dialysis.  Palliative care consult for goals of care because of her extremely poor prognosis and advanced age.  Oncology was also following.  After family meeting, decision was made to discharge her to home with hospice.  Comfort care initiated.  Discharge to home after the delivery of equipment.  Assessment & Plan:   Principal Problem:   Acute encephalopathy Active Problems:   Obstructive sleep apnea   ESRD (end stage renal disease) on dialysis (HCC)   Cancer associated pain   Pressure injury of skin   Atypical atrial flutter (HCC)   Metastatic multiple myeloma to bone (HCC)   Acute encephalopathy: Unclear etiology.  Could be secondary to polypharmacy from pain medication, uremia.  Ammonia level normal.  She is not hypoxic or hypercarbic.  Today she is alert and oriented. CT head did not show any acute intracranial abnormalities.  Metastatic multiple myeloma: Follows with Dr. Alvy Bimler.  Recent lab are concerning for relapse.  CT imaging done in the emergency department showed widespread lytic lesions, large destructive lesion involving T1 and C7 with paraspinal soft tissue mass.  Oncology not planning for continued chemotherapy due to her poor performance status.  ESRD on dialysis: Dialysis has been stopped now because she is on comfort care.  Chronic pain syndrome:  Continue  supportive care.  History of atrial flutter: Currently rate is controlled.  She was on Eliquis.  She is post pacemaker placement  Type 2 diabetes: Has poor oral intake.  Recent hemoglobin A1c of 4.6.  Was taking glimepiride at home which will be discontinued on discharge.   Hospital course remarkable for episodes of hypoglycemia.  Normocytic anemia: Secondary to chronic disease, history of malignancy.  Hemoglobin in the range of 8-9  Leukocytosis: Likely from steroids.    Constipation: Continue bowel regimen  Goals of care: Extremely elderly patient, significantly deconditioned, now has metastatic multiple myeloma and also on dialysis.  Palliative care consulted.  After family meeting, comfort care initiated, goal is to discharge her home with hospice  Pressure Injury 10/27/21 Buttocks Stage 2 -  Partial thickness loss of dermis presenting as a shallow open injury with a red, pink wound bed without slough. (Active)  10/27/21 1530  Location: Buttocks  Location Orientation:   Staging: Stage 2 -  Partial thickness loss of dermis presenting as a shallow open injury with a red, pink wound bed without slough.  Wound Description (Comments):   Present on Admission: Yes        Nutrition Problem: Severe Malnutrition Etiology: chronic illness (ESRD on HD)      DVT prophylaxis:Eliquis Code Status: Full Family Communication:: Discussed with son on phone on 10/27/2021 Patient status:  Dispo: The patient is from: Home              Anticipated d/c is to: Home with hospice              Anticipated d/c date is:  Likely tomorrow after delivery of equipment  Consultants: Oncology,Nephrology, palliative care  Procedures:None  Antimicrobials:  Anti-infectives (From admission, onward)    Start     Dose/Rate Route Frequency Ordered Stop   10/27/21 1000  acyclovir (ZOVIRAX) tablet 400 mg  Status:  Discontinued        400 mg Oral Daily 10/27/21 0325 10/28/21 1732       Subjective: Patient  seen and examined at the bedside this morning.  Hemodynamically stable and overall comfortable.  Complains of lack of bowel movement.  Denies any other complaints except for back pain  Objective: Vitals:   10/27/21 2100 10/28/21 0508 10/28/21 0855 10/28/21 2054  BP: (!) 101/52 (!) 120/49 (!) 109/49 (!) 135/58  Pulse: 70 70 81 70  Resp: _0 Temp: 99.2 F (37.3 C) 99 F (37.2 C) 97.9 F (36.6 C) 98 F (36.7 C)  TempSrc: Oral   Oral  SpO2: 100% 98% 99% 95%  Weight:      Height:       No intake or output data in the 24 hours ending 10/29/21 1353  Filed Weights   10/27/21 1228 10/27/21 1540 10/27/21 1842  Weight: 55.8 kg 56.1 kg 56.2 kg    Examination:    General exam: Very deconditioned, chronically ill looking, weak HEENT: PERRL Respiratory system:  no wheezes or crackles  Cardiovascular system: S1 & S2 heard, RRR.  Gastrointestinal system: Abdomen is distended, soft and nontender. Central nervous system: Alert and oriented Extremities: No edema, no clubbing ,no cyanosis, dialysis acess on the right upper extremity Skin: No rashes, no ulcers,no icterus    Data Reviewed: I have personally reviewed following labs and imaging studies  CBC: Recent Labs  Lab 10/24/21 1234 10/27/21 0125 10/27/21 0616 10/28/21 0219  WBC 8.8 15.8* 14.1* 12.0*  NEUTROABS 6.8 13.4*  --   --   HGB 9.4* 9.7* 8.9* 8.4*  HCT 28.9* 30.1* 27.6* 26.3*  MCV 99.0 101.0* 101.1* 100.0  PLT 225 199 199 681   Basic Metabolic Panel: Recent Labs  Lab 10/24/21 1234 10/27/21 0125 10/27/21 0616 10/27/21 1532 10/28/21 0219  NA 133* 132* 132*  --  135  K 4.4 5.3* 5.8*  --  4.6  CL 94* 93* 92*  --  97*  CO2 _1 --  24  GLUCOSE 116* 91 88  --  72  BUN 65* 74* 80*  --  54*  CREATININE 5.90* 6.50* 6.61*  --  5.06*  CALCIUM 9.4 9.6 9.7  --  9.2  MG  --  2.8*  --   --   --   PHOS  --   --   --  6.9*  --    GFR: Estimated Creatinine Clearance: 6.7 mL/min (A) (by C-G formula based on SCr  of 5.06 mg/dL (H)). Liver Function Tests: Recent Labs  Lab 10/24/21 1234 10/27/21 0125  AST 19 29  ALT 14 19  ALKPHOS 43 39  BILITOT 0.3 0.6  PROT 7.7 7.6  ALBUMIN 3.6 3.0*   No results for input(s): LIPASE, AMYLASE in the last 168 hours. Recent Labs  Lab 10/27/21 0612  AMMONIA 19   Coagulation Profile: No results for input(s): INR, PROTIME in the last 168 hours. Cardiac Enzymes: No results for input(s): CKTOTAL, CKMB, CKMBINDEX, TROPONINI in the last 168 hours. BNP (last 3 results) No results for input(s): PROBNP in the last 8760 hours. HbA1C: Recent Labs    10/27/21 0615  HGBA1C 5.2  CBG: Recent Labs  Lab 10/28/21 1134 10/28/21 1705 10/28/21 2053 10/29/21 0658 10/29/21 1140  GLUCAP 105* 121* 126* 81 113*   Lipid Profile: No results for input(s): CHOL, HDL, LDLCALC, TRIG, CHOLHDL, LDLDIRECT in the last 72 hours. Thyroid Function Tests: Recent Labs    10/27/21 0616  TSH 1.682   Anemia Panel: Recent Labs    10/27/21 0616  VITAMINB12 533   Sepsis Labs: No results for input(s): PROCALCITON, LATICACIDVEN in the last 168 hours.  Recent Results (from the past 240 hour(s))  Resp Panel by RT-PCR (Flu A&B, Covid) Nasopharyngeal Swab     Status: None   Collection Time: 10/27/21 12:33 AM   Specimen: Nasopharyngeal Swab; Nasopharyngeal(NP) swabs in vial transport medium  Result Value Ref Range Status   SARS Coronavirus 2 by RT PCR NEGATIVE NEGATIVE Final    Comment: (NOTE) SARS-CoV-2 target nucleic acids are NOT DETECTED.  The SARS-CoV-2 RNA is generally detectable in upper respiratory specimens during the acute phase of infection. The lowest concentration of SARS-CoV-2 viral copies this assay can detect is 138 copies/mL. A negative result does not preclude SARS-Cov-2 infection and should not be used as the sole basis for treatment or other patient management decisions. A negative result may occur with  improper specimen collection/handling, submission of  specimen other than nasopharyngeal swab, presence of viral mutation(s) within the areas targeted by this assay, and inadequate number of viral copies(<138 copies/mL). A negative result must be combined with clinical observations, patient history, and epidemiological information. The expected result is Negative.  Fact Sheet for Patients:  EntrepreneurPulse.com.au  Fact Sheet for Healthcare Providers:  IncredibleEmployment.be  This test is no t yet approved or cleared by the [place] FDA and  has been authorized for detection and/or diagnosis of SARS-CoV-2 by FDA under an Emergency Use Authorization (EUA). This EUA will remain  in effect (meaning this test can be used) for the duration of the COVID-19 declaration under Section 564(b)(1) of the Act, 21 U.S.C.section 360bbb-3(b)(1), unless the authorization is terminated  or revoked sooner.       Influenza A by PCR NEGATIVE NEGATIVE Final   Influenza B by PCR NEGATIVE NEGATIVE Final    Comment: (NOTE) The Xpert Xpress SARS-CoV-2/FLU/RSV plus assay is intended as an aid in the diagnosis of influenza from Nasopharyngeal swab specimens and should not be used as a sole basis for treatment. Nasal washings and aspirates are unacceptable for Xpert Xpress SARS-CoV-2/FLU/RSV testing.  Fact Sheet for Patients: EntrepreneurPulse.com.au  Fact Sheet for Healthcare Providers: IncredibleEmployment.be  This test is not yet approved or cleared by the [place] FDA and has been authorized for detection and/or diagnosis of SARS-CoV-2 by FDA under an Emergency Use Authorization (EUA). This EUA will remain in effect (meaning this test can be used) for the duration of the COVID-19 declaration under Section 564(b)(1) of the Act, 21 U.S.C. section 360bbb-3(b)(1), unless the authorization is terminated or revoked.  Performed at [facility], [place] [address],  [place], [place] [address]   MRSA Next Gen by PCR, Nasal     Status: None   Collection Time: 10/27/21  2:58 PM   Specimen: Nasal Mucosa; Nasal Swab  Result Value Ref Range Status   MRSA by PCR Next Gen NOT DETECTED NOT DETECTED Final    Comment: (NOTE) The GeneXpert MRSA Assay (FDA approved for NASAL specimens only), is one component of a comprehensive MRSA colonization surveillance program. It is not intended to diagnose MRSA infection nor to guide or  monitor treatment for MRSA infections. Test performance is not FDA approved in patients less than 16 years old. Performed at [facility], [place] [address], [place], [place] [address]          Radiology Studies: No results found.      Scheduled Meds:  antiseptic oral rinse  15 mL Topical BID   dexamethasone  4 mg Oral Q breakfast   HYDROmorphone HCl  12 mg Oral Q24H   insulin aspart  0-6 Units Subcutaneous TID WC   lidocaine  1 patch Transdermal Daily   polyethylene glycol  17 g Oral Daily   Continuous Infusions:   LOS: 0 days    Time spent: 35 mins.More than 50% of that time was spent in counseling and/or coordination of care.      Shelly Coss, MD [facility] P1/14/2023, 1:53 PM

## 2021-10-29 NOTE — Progress Notes (Signed)
Daily Progress Note   Patient Name: [name]       Date: 10/29/2021 DOB: November 22, 1929  Age: 88 y.o. MRN#: [id number] Attending Physician: Shelly Coss, MD Primary Care Physician: Lucianne Lei, MD Admit Date: 10/26/2021  Reason for Consultation/Follow-up: symptom management  HPI/Patient Profile: 88 y.o. female  with past medical history of ESRD on hemodialysis, type 2 diabetes mellitus, OSA, sick sinus syndrome with pacer, multiple myeloma, and chronic cancer-related pain presented to ED on 10/26/21 from home with son's concerns that patient was experiencing several days of progressive confusion, increased back pain, and new sacral wound. Patient was  admitted on 10/26/2021 with acute encephalopathy, multiple myeloma not achieving remission, ESRD on HD, chronic pain, pressure injury. Patient is followed by [facility] who has seen patient in house and does not feel patient can tolerate any further chemotherapy due to her frail status - she recommends hospice care. Nephrology was also consulted and feels if patient is no longer able to tolerate chemotherapy, it is unlikely they will be able to continue dialysis.  Subjective: MAR reviewed/ Note that patient has received 3 doses of prn dilaudid since 0700 this morning.   I went to see patient at bedside. Her son/Jacques is present and speaking with Wendi RN case manager about choice of hospice agency. Discussed that [facility] is provided through patient's insurance and does not provide hospice services.   Patient appears comfortable when I first enter the room, but with any movement she is reporting back pain. Discussed risk of patient being more lethargic and unable to interact with family if pain medication is increased. [name]  is understanding and accepting of this risk, as comfort is his main priority for his mother.    Objective:   Physical Exam Vitals reviewed.  Constitutional:      General: She is not in acute distress.    Appearance: She is ill-appearing.  Neurological:     Mental Status: She is lethargic.     Motor: Weakness present.            Vital Signs: BP (!) 135/58 (BP Location: Left Arm)    Pulse 70    Temp 98 F (36.7 C) (Oral)    Resp 18    Ht _0  (1.651 m)    Wt 56.2 kg  SpO2 95%    BMI 20.62 kg/m  SpO2: SpO2: 95 % O2 Device: O2 Device: Room Air O2 Flow Rate:      LBM: Last BM Date:  (PTA) Baseline Weight: Weight: 62 kg Most recent weight: Weight: 56.2 kg       Palliative Assessment/Data: PPS 20%      Palliative Care Assessment & Plan   Assessment: Acute encephalopathy Mestastatic multiple myeloma ESRD on HD Chronic pain syndrome DM2  Recommendations/Plan: Continue comfort measures Start long-acting dilaudid (Exalgo) 16 mg daily Increase prn Dilaudid to 4-6 mg every 2 hours as needed IV Dilaudid 1 mg now x 1 dose Continue dexamethasone 4 mg daily PMT will continue to follow  Goals of Care and Additional Recommendations: Limitations on Scope of Treatment: Full Comfort Care  Code Status: DNR/DNI   Prognosis:  < 2 weeks  Discharge Planning: Home with Hospice   Thank you for allowing the Palliative Medicine Team to assist in the care of this patient.  MDM - High due to: 1 or more chronic illnesses with severe exacerbation, progression, or side effects of treatment OR acute or chronic illness or injury that poses a threat to life or bodily function Review of prior external notes, review of test results, assessment requiring an independent historian Discussion or decision not to resuscitate or to de-escalate care because poor prognosis        Lavena Bullion, NP  Please contact Palliative Medicine Team phone at [phone] for questions and  concerns.

## 2021-10-30 ENCOUNTER — Other Ambulatory Visit: Payer: Self-pay | Admitting: Hematology and Oncology

## 2021-10-30 LAB — GLUCOSE, CAPILLARY
Glucose-Capillary: 88 mg/dL (ref 70–99)
Glucose-Capillary: 88 mg/dL (ref 70–99)
Glucose-Capillary: 102 mg/dL — ABNORMAL HIGH (ref 70–99)

## 2021-10-30 NOTE — Progress Notes (Signed)
Patient refused CPAP at this time.

## 2021-10-30 NOTE — Progress Notes (Signed)
Daily Progress Note   Patient Name: [name]       Date: 10/30/2021 DOB: [birth year]  Age: 88 y.o. MRN#: [id number] Attending Physician: Shelly Coss, MD Primary Care Physician: Lucianne Lei, MD Admit Date: 10/26/2021  Reason for Consultation/Follow-up: symptom management  HPI/Patient Profile: 88 y.o. female  with past medical history of ESRD on hemodialysis, type 2 diabetes mellitus, OSA, sick sinus syndrome with pacer, multiple myeloma, and chronic cancer-related pain presented to ED on 10/26/21 from home with son's concerns that patient was experiencing several days of progressive confusion, increased back pain, and new sacral wound. Patient was  admitted on 10/26/2021 with acute encephalopathy, multiple myeloma not achieving remission, ESRD on HD, chronic pain, pressure injury. Patient is followed by [facility] who has seen patient in house and does not feel patient can tolerate any further chemotherapy due to her frail status - she recommends hospice care. Nephrology was also consulted and feels if patient is no longer able to tolerate chemotherapy, it is unlikely they will be able to continue dialysis.  Subjective: Patient appears comfortable. [name] is at bedside. She reports that patient is really only having pain with movement/turning. I let Remo Lipps know about the changes I made to pain medication regimen yesterday. Provided education on the difference between short-acting and long-acting opioids. Discussed that patient could have the short-acting dilaudid as often as every 2 hours as needed. Discussed that it should be given 30 minutes prior to any anticipated turning/cleaning. Remo Lipps verbalizes understanding.  Family has selected a hospice agency - [facility]. TOC team is  aware and has made the referral.   Length of Stay: 1  Current Medications: Scheduled Meds:   antiseptic oral rinse  15 mL Topical BID   dexamethasone  4 mg Oral Q breakfast   HYDROmorphone HCl  16 mg Oral Q24H   insulin aspart  0-6 Units Subcutaneous TID WC   lidocaine  1 patch Transdermal Daily   polyethylene glycol  17 g Oral Daily     PRN Meds: acetaminophen **OR** acetaminophen, diphenhydrAMINE, feeding supplement (NEPRO CARB STEADY), fluorometholone, glycopyrrolate **OR** glycopyrrolate **OR** glycopyrrolate, haloperidol **OR** haloperidol **OR** haloperidol lactate, HYDROmorphone, LORazepam **OR** LORazepam **OR** LORazepam, ondansetron **OR** ondansetron (ZOFRAN) IV, polyvinyl alcohol  Physical Exam Vitals reviewed.  Constitutional:      General: She is not  in acute distress.    Appearance: She is ill-appearing.  Pulmonary:     Effort: Pulmonary effort is normal.  Neurological:     Mental Status: She is alert.     Motor: Weakness present.            Vital Signs: BP (!) 120/36    Pulse 68    Temp 97.9 F (36.6 C) (Oral)    Resp 18    Ht 5' 5"  (1.651 m)    Wt 56.2 kg    SpO2 100%    BMI 20.62 kg/m  SpO2: SpO2: 100 % O2 Device: O2 Device: Room Air O2 Flow Rate:     LBM: Last BM Date: 10/29/21 Baseline Weight: Weight: 62 kg Most recent weight: Weight: 56.2 kg       Palliative Assessment/Data: PPS 20%     Palliative Care Assessment & Plan   Assessment: Acute encephalopathy Mestastatic multiple myeloma ESRD on HD Chronic pain syndrome DM2   Recommendations/Plan: Continue comfort measures Continue long-acting dilaudid (Exalgo) 16 mg daily Continue prn Dilaudid to 4-6 mg every 2 hours as needed Continue dexamethasone 4 mg daily PMT will continue to follow  Goals of Care and Additional Recommendations: Limitations on Scope of Treatment: Full Comfort Care  Code Status: DNR/DNI   Prognosis:  < 2 weeks  Discharge Planning: Home with  Hospice   Thank you for allowing the Palliative Medicine Team to assist in the care of this patient.  MDM - Moderate   Lavena Bullion, NP  Please contact Palliative Medicine Team phone at [phone] for questions and concerns.

## 2021-10-30 NOTE — Progress Notes (Signed)
PROGRESS NOTE    [name]  WYO:[id number] DOB: 08-27-1930 DOA: 10/26/2021 PCP: Lucianne Lei, MD   Chief Complain: Confusion, back pain  Brief Narrative:  Patient is a 88 year old female with past medical history of metastatic multiple myeloma, ESRD on dialysis, type 2 diabetes, OSA, sick sinus syndrome with pacemaker, chronic cancer-related pain who presented to the emergency room with confusion, back pain.  Patient was brought by her son due to progressive confusion.  There is also report of new sacral wound.  On presentation she was confused but hemodynamically stable.  Nephrology consulted for dialysis.  Palliative care consult for goals of care because of her extremely poor prognosis and advanced age.  Oncology was also following.  After family meeting, decision was made to discharge her to home with hospice.  Comfort care initiated.  Discharge to home after the delivery of equipment.  Assessment & Plan:   Principal Problem:   Acute encephalopathy Active Problems:   Obstructive sleep apnea   ESRD (end stage renal disease) on dialysis (HCC)   Cancer associated pain   Pressure injury of skin   Atypical atrial flutter (HCC)   Metastatic multiple myeloma to bone (HCC)   Acute encephalopathy: Unclear etiology.  Could be secondary to polypharmacy from pain medication, uremia.  Ammonia level normal.  She is not hypoxic or hypercarbic.  Today she is alert and oriented. CT head did not show any acute intracranial abnormalities.  Metastatic multiple myeloma: Follows with Dr. Alvy Bimler.  Recent lab are concerning for relapse.  CT imaging done in the emergency department showed widespread lytic lesions, large destructive lesion involving T1 and C7 with paraspinal soft tissue mass.  Oncology not planning for continued chemotherapy due to her poor performance status.  ESRD on dialysis: Dialysis has been stopped now because she is on comfort care.  Chronic pain syndrome:  Continue  supportive care.  History of atrial flutter: Currently rate is controlled.  She was on Eliquis.  She is post pacemaker placement  Type 2 diabetes: Has poor oral intake.  Recent hemoglobin A1c of 4.6.  Was taking glimepiride at home which will be discontinued on discharge.   Hospital course remarkable for episodes of hypoglycemia.  Normocytic anemia: Secondary to chronic disease, history of malignancy.  Hemoglobin in the range of 8-9  Leukocytosis: Likely from steroids.    Constipation: Continue bowel regimen.  Ordered enema  Goals of care: Extremely elderly patient, significantly deconditioned, now has metastatic multiple myeloma and also on dialysis.  Palliative care consulted.  After family meeting, comfort care initiated, goal is to discharge her home with hospice  Pressure Injury 10/27/21 Buttocks Stage 2 -  Partial thickness loss of dermis presenting as a shallow open injury with a red, pink wound bed without slough. (Active)  10/27/21 1530  Location: Buttocks  Location Orientation:   Staging: Stage 2 -  Partial thickness loss of dermis presenting as a shallow open injury with a red, pink wound bed without slough.  Wound Description (Comments):   Present on Admission: Yes        Nutrition Problem: Severe Malnutrition Etiology: chronic illness (ESRD on HD)      DVT prophylaxis:Eliquis Code Status: Full Family Communication:: Discussed with daughter at bedside today  patient status:  Dispo: The patient is from: Home              Anticipated d/c is to: Home with hospice              Anticipated  d/c date is: Likely tomorrow after delivery of equipment  Consultants: Oncology,Nephrology, palliative care  Procedures:None  Antimicrobials:  Anti-infectives (From admission, onward)    Start     Dose/Rate Route Frequency Ordered Stop   10/27/21 1000  acyclovir (ZOVIRAX) tablet 400 mg  Status:  Discontinued        400 mg Oral Daily 10/27/21 0325 10/28/21 1732        Subjective: Patient seen and examined at the bedside.  Hemodynamically stable.  Lying in bed.  Complains of back pain.  Daughter at the bedside.  No new questions or complaints  Objective: Vitals:   10/28/21 0855 10/28/21 2054 10/29/21 2105 10/30/21 0906  BP: (!) 109/49 (!) 135/58 (!) 119/58 (!) 120/36  Pulse: 81 70 74 68  Resp: 17 18 17 18   Temp: 97.9 F (36.6 C) 98 F (36.7 C) 97.7 F (36.5 C) 97.9 F (36.6 C)  TempSrc:  Oral Oral Oral  SpO2: 99% 95% 100% 100%  Weight:      Height:        Intake/Output Summary (Last 24 hours) at 10/30/2021 1138 Last data filed at 10/30/2021 0800 Gross per 24 hour  Intake 240 ml  Output 150 ml  Net 90 ml    Filed Weights   10/27/21 1228 10/27/21 1540 10/27/21 1842  Weight: 55.8 kg 56.1 kg 56.2 kg    Examination:    General exam: Deconditioned, chronically ill looking, weak HEENT: PERRL Respiratory system:  no wheezes or crackles  Cardiovascular system: S1 & S2 heard, RRR.  Gastrointestinal system: Abdomen is nondistended, soft and nontender. Central nervous system: Alert and oriented Extremities: No edema, no clubbing ,no cyanosis Skin: No rashes, no ulcers,no icterus      Data Reviewed: I have personally reviewed following labs and imaging studies  CBC: Recent Labs  Lab 10/24/21 1234 10/27/21 0125 10/27/21 0616 10/28/21 0219  WBC 8.8 15.8* 14.1* 12.0*  NEUTROABS 6.8 13.4*  --   --   HGB 9.4* 9.7* 8.9* 8.4*  HCT 28.9* 30.1* 27.6* 26.3*  MCV 99.0 101.0* 101.1* 100.0  PLT 225 199 199 712   Basic Metabolic Panel: Recent Labs  Lab 10/24/21 1234 10/27/21 0125 10/27/21 0616 10/27/21 1532 10/28/21 0219  NA 133* 132* 132*  --  135  K 4.4 5.3* 5.8*  --  4.6  CL 94* 93* 92*  --  97*  CO2 24 23 22   --  24  GLUCOSE 116* 91 88  --  72  BUN 65* 74* 80*  --  54*  CREATININE 5.90* 6.50* 6.61*  --  5.06*  CALCIUM 9.4 9.6 9.7  --  9.2  MG  --  2.8*  --   --   --   PHOS  --   --   --  6.9*  --    GFR: Estimated  Creatinine Clearance: 6.7 mL/min (A) (by C-G formula based on SCr of 5.06 mg/dL (H)). Liver Function Tests: Recent Labs  Lab 10/24/21 1234 10/27/21 0125  AST 19 29  ALT 14 19  ALKPHOS 43 39  BILITOT 0.3 0.6  PROT 7.7 7.6  ALBUMIN 3.6 3.0*   No results for input(s): LIPASE, AMYLASE in the last 168 hours. Recent Labs  Lab 10/27/21 0612  AMMONIA 19   Coagulation Profile: No results for input(s): INR, PROTIME in the last 168 hours. Cardiac Enzymes: No results for input(s): CKTOTAL, CKMB, CKMBINDEX, TROPONINI in the last 168 hours. BNP (last 3 results) No results for input(s): PROBNP  in the last 8760 hours. HbA1C: No results for input(s): HGBA1C in the last 72 hours.  CBG: Recent Labs  Lab 10/29/21 0658 10/29/21 1140 10/29/21 1631 10/29/21 2104 10/30/21 0633  GLUCAP 81 113* 148* 154* 88   Lipid Profile: No results for input(s): CHOL, HDL, LDLCALC, TRIG, CHOLHDL, LDLDIRECT in the last 72 hours. Thyroid Function Tests: No results for input(s): TSH, T4TOTAL, FREET4, T3FREE, THYROIDAB in the last 72 hours.  Anemia Panel: No results for input(s): VITAMINB12, FOLATE, FERRITIN, TIBC, IRON, RETICCTPCT in the last 72 hours.  Sepsis Labs: No results for input(s): PROCALCITON, LATICACIDVEN in the last 168 hours.  Recent Results (from the past 240 hour(s))  Resp Panel by RT-PCR (Flu A&B, Covid) Nasopharyngeal Swab     Status: None   Collection Time: 10/27/21 12:33 AM   Specimen: Nasopharyngeal Swab; Nasopharyngeal(NP) swabs in vial transport medium  Result Value Ref Range Status   SARS Coronavirus 2 by RT PCR NEGATIVE NEGATIVE Final    Comment: (NOTE) SARS-CoV-2 target nucleic acids are NOT DETECTED.  The SARS-CoV-2 RNA is generally detectable in upper respiratory specimens during the acute phase of infection. The lowest concentration of SARS-CoV-2 viral copies this assay can detect is 138 copies/mL. A negative result does not preclude SARS-Cov-2 infection and should not  be used as the sole basis for treatment or other patient management decisions. A negative result may occur with  improper specimen collection/handling, submission of specimen other than nasopharyngeal swab, presence of viral mutation(s) within the areas targeted by this assay, and inadequate number of viral copies(<138 copies/mL). A negative result must be combined with clinical observations, patient history, and epidemiological information. The expected result is Negative.  Fact Sheet for Patients:  EntrepreneurPulse.com.au  Fact Sheet for Healthcare Providers:  IncredibleEmployment.be  This test is no t yet approved or cleared by the [place] FDA and  has been authorized for detection and/or diagnosis of SARS-CoV-2 by FDA under an Emergency Use Authorization (EUA). This EUA will remain  in effect (meaning this test can be used) for the duration of the COVID-19 declaration under Section 564(b)(1) of the Act, 21 U.S.C.section 360bbb-3(b)(1), unless the authorization is terminated  or revoked sooner.       Influenza A by PCR NEGATIVE NEGATIVE Final   Influenza B by PCR NEGATIVE NEGATIVE Final    Comment: (NOTE) The Xpert Xpress SARS-CoV-2/FLU/RSV plus assay is intended as an aid in the diagnosis of influenza from Nasopharyngeal swab specimens and should not be used as a sole basis for treatment. Nasal washings and aspirates are unacceptable for Xpert Xpress SARS-CoV-2/FLU/RSV testing.  Fact Sheet for Patients: EntrepreneurPulse.com.au  Fact Sheet for Healthcare Providers: IncredibleEmployment.be  This test is not yet approved or cleared by the [place] FDA and has been authorized for detection and/or diagnosis of SARS-CoV-2 by FDA under an Emergency Use Authorization (EUA). This EUA will remain in effect (meaning this test can be used) for the duration of the COVID-19 declaration under Section  564(b)(1) of the Act, 21 U.S.C. section 360bbb-3(b)(1), unless the authorization is terminated or revoked.  Performed at [facility], [place] [address], [place], [place] [address]   MRSA Next Gen by PCR, Nasal     Status: None   Collection Time: 10/27/21  2:58 PM   Specimen: Nasal Mucosa; Nasal Swab  Result Value Ref Range Status   MRSA by PCR Next Gen NOT DETECTED NOT DETECTED Final    Comment: (NOTE) The GeneXpert MRSA Assay (FDA approved for NASAL  specimens only), is one component of a comprehensive MRSA colonization surveillance program. It is not intended to diagnose MRSA infection nor to guide or monitor treatment for MRSA infections. Test performance is not FDA approved in patients less than 43 years old. Performed at [facility], [place] [address], [place], [place] [address]          Radiology Studies: No results found.      Scheduled Meds:  antiseptic oral rinse  15 mL Topical BID   dexamethasone  4 mg Oral Q breakfast   HYDROmorphone HCl  16 mg Oral Q24H   insulin aspart  0-6 Units Subcutaneous TID WC   lidocaine  1 patch Transdermal Daily   polyethylene glycol  17 g Oral Daily   Continuous Infusions:   LOS: 1 day    Time spent: 35 mins.More than 50% of that time was spent in counseling and/or coordination of care.      Shelly Coss, MD [facility] P1/15/2023, 11:38 AM

## 2021-10-30 NOTE — TOC Progression Note (Signed)
Transition of Care [facility]) - Progression Note    Patient Details  Name: [name] MRN: [id number] Date of Birth: May 25, 1930  Transition of Care [facility]) CM/SW Contact  Bartholomew Crews, RN Phone Number: [phone] 10/30/2021, 7:50 AM  Clinical Narrative:     Family selected [facility]. Referral placed. [facility] to reach out to family on Monday morning. TOC following for transition needs.   Expected Discharge Plan: Home w Hospice Care Barriers to Discharge: Other (must enter comment) (working on home hospice arrangements)  Expected Discharge Plan and Services Expected Discharge Plan: Home w Hospice Care In-house Referral: Hospice / Palliative Care Discharge Planning Services: CM Consult Post Acute Care Choice: Hospice Living arrangements for the past 2 months: Single Family Home                                       Social Determinants of Health (SDOH) Interventions    Readmission Risk Interventions Readmission Risk Prevention Plan 04/04/2020 03/03/2019  Transportation Screening Complete Complete  PCP or Specialist Appt within 3-5 Days - Not Complete  Not Complete comments - not yet ready for d/c  HRI or [place] - Not Complete  HRI or Home Care Consult comments - no needs at this time  Social Work Consult for [place] Planning/Counseling - Not Complete  SW consult not completed comments - no needs at this time  Palliative Care Screening - Not Applicable  Medication Review (RN Care Manager) Complete Complete  PCP or Specialist appointment within 3-5 days of discharge (No Data) -  [place] or Home Care Consult Complete -  SW Recovery Care/Counseling Consult Complete -  Palliative Care Screening Not Applicable -  [place] Not Applicable -  Some recent data might be hidden

## 2021-10-31 ENCOUNTER — Encounter: Payer: Self-pay | Admitting: Hematology and Oncology

## 2021-10-31 MED ORDER — MINERAL OIL RE ENEM
1.0000 | ENEMA | Freq: Once | RECTAL | Status: AC
  Administered 2021-10-31: 1 via RECTAL
  Filled 2021-10-31 (×2): qty 1

## 2021-10-31 MED ORDER — HYDROMORPHONE HCL 4 MG PO TABS: 4.0000 mg | ORAL_TABLET | ORAL | 0 refills | Status: AC | PRN

## 2021-10-31 MED ORDER — HYDROMORPHONE HCL ER 8 MG PO TB24
32.0000 mg | ORAL_TABLET | ORAL | Status: DC
  Administered 2021-10-31: 32 mg via ORAL
  Filled 2021-10-31: qty 4

## 2021-10-31 MED ORDER — HYDROMORPHONE HCL ER 32 MG PO TB24: 28.0000 mg | ORAL_TABLET | ORAL | 0 refills | Status: AC

## 2021-10-31 MED ORDER — HYDROMORPHONE HCL ER 8 MG PO TB24: 28.0000 mg | ORAL_TABLET | ORAL | Status: DC

## 2021-10-31 NOTE — Progress Notes (Signed)
Contacted [facility] and spoke to staff regarding pt's d/c to home with hospice today. Clinic advised pt has decided to stop HD and transition to comfort care.   Melven Sartorius Renal Navigator [phone]

## 2021-10-31 NOTE — Discharge Summary (Signed)
Physician Discharge Summary  MIEKA LEATON GYI:[id number] DOB: October 02, 1930 DOA: 10/26/2021  PCP: Lucianne Lei, MD  Admit date: 10/26/2021 Discharge date: 10/31/2021  Admitted From: Home Disposition:  Home with hospice  Discharge Condition:Stable CODE STATUS: DNR Diet recommendation:  Regular   Brief/Interim Summary:  Patient is a 88 year old female with past medical history of metastatic multiple myeloma, ESRD on dialysis, type 2 diabetes, OSA, sick sinus syndrome with pacemaker, chronic cancer-related pain who presented to the emergency room with confusion, back pain.  Patient was brought by her son due to progressive confusion.  There is also report of new sacral wound.  On presentation she was confused but hemodynamically stable.  Nephrology consulted for dialysis.  Palliative care consult for goals of care because of her extremely poor prognosis and advanced age.  Oncology was also following.  After family meeting, decision was made to discharge her to home with hospice.  Comfort care initiated.  Discharge to home after the delivery of equipment.  Following problems were addressed during her hospitalization:  Acute encephalopathy: Unclear etiology.  Could be secondary to polypharmacy from pain medication, uremia.  Ammonia level normal.  She is not hypoxic or hypercarbic.  Today she is alert and oriented. CT head did not show any acute intracranial abnormalities.   Metastatic multiple myeloma: Follows with Dr. Alvy Bimler.  Recent lab are concerning for relapse.  CT imaging done in the emergency department showed widespread lytic lesions, large destructive lesion involving T1 and C7 with paraspinal soft tissue mass.  Oncology not planning for continued chemotherapy due to her poor performance status.   ESRD on dialysis: Dialysis has been stopped now because she is on comfort care.   Chronic pain syndrome:  Continue supportive care,pain meds.   History of atrial flutter: Currently rate is  controlled.  She was on Eliquis.  She is post pacemaker placement,needs to be turned off before dc,family agreeable   Type 2 diabetes: Has poor oral intake.  Recent hemoglobin A1c of 4.6.  Was taking glimepiride at home which will be discontinued on discharge.   Hospital course remarkable for episodes of hypoglycemia.   Normocytic anemia: Secondary to chronic disease, history of malignancy.  Hemoglobin in the range of 8-9   Leukocytosis: Likely from steroids.     Constipation: Continue bowel regimen.    Goals of care: Extremely elderly patient, significantly deconditioned, now has metastatic multiple myeloma and also on dialysis.  Palliative care consulted.  After family meeting, comfort care initiated, goal is to discharge her home with hospice   Pressure Injury 10/27/21 Buttocks Stage 2 -  Partial thickness loss of dermis presenting as a shallow open injury with a red, pink wound bed without slough. (Active)  10/27/21 1530  Location: Buttocks  Location Orientation:   Staging: Stage 2 -  Partial thickness loss of dermis presenting as a shallow open injury with a red, pink wound bed without slough.  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   Increase activity slowly   Complete by: As directed    No wound care   Complete by: As directed       Allergies as of 10/31/2021       Reactions   Aspirin Other (See Comments)   REACTION: STOMACH  ISSUES WITH DOSE HIGHER THAN 81 MG  Other reaction(s): GI Upset (intolerance), Other (See Comments) REACTION: STOMACH ISSUES WITH DOSE HIGHER THAN 81 MG  REACTION: STOMACH ISSUES WITH DOSE HIGHER THAN 81 MG    Hydrocodone-acetaminophen Nausea And Vomiting   Morphine Nausea And Vomiting   Penicillins Rash   Patient took injection and tablets, had a reaction. She has taken amoxicillin with no reaction Has patient had a PCN reaction causing immediate rash, facial/tongue/throat swelling, SOB or lightheadedness with hypotension: Yes Has patient had a PCN reaction causing severe rash involving mucus membranes or skin necrosis: Yes Has patient had a PCN reaction that required hospitalization No Has patient had a PCN reaction occurring within the last 10 years: No If all of the above answers are "NO", then m Patient took injection and tablets, had a reaction. She has taken amoxicillin with no reaction Patient took injection and tablets, had a reaction. She has taken amoxicillin with no reaction Has patient had a PCN reaction causing immediate rash, facial/tongue/throat swelling, SOB or lightheadedness with hypotension: Yes Has patient had a PCN reaction causing severe rash involving mucus membranes or skin necrosis: Yes Has patient had a PCN reaction that required hospitalization No Has patient had a PCN reaction occurring within the last 10 years: No If all of the above answers are "NO", then m   Betaine Nausea And Vomiting   Dacarbazine Nausea And Vomiting   Ketorolac Tromethamine Itching   Brimonidine Itching   Diltiazem Hcl Rash   Hydrocodone-acetaminophen Nausea And Vomiting   Neurontin [gabapentin] Nausea And Vomiting   Sulfacetamide Sodium Rash        Medication List     STOP taking these medications    glimepiride 1 MG tablet Commonly known as: AMARYL   oxyCODONE 5 MG immediate release tablet Commonly known as: Oxy IR/ROXICODONE       TAKE these medications     acetaminophen 500 MG tablet Commonly known as: TYLENOL Take 1,000 mg by mouth 2 (two) times daily as needed for mild pain or moderate pain.   acyclovir 400 MG tablet Commonly known as: ZOVIRAX Take 1 tablet (400 mg total) by mouth daily.   allopurinol 100 MG tablet Commonly known as: ZYLOPRIM Take 0.5 tablets (50 mg total) by mouth daily.   cholecalciferol 25 MCG (1000 UNIT) tablet Commonly known as: VITAMIN D3 Take 2,000 Units by mouth daily.   citalopram 10 MG tablet Commonly known as: CELEXA Take 10 mg by mouth daily.   cyclobenzaprine 5 MG tablet Commonly known as: FLEXERIL Take 1 tablet (5 mg total) by mouth 3 (three) times daily as needed for muscle spasms.   dexamethasone 2 MG tablet Commonly known as: DECADRON TAKE 1 TABLET BY MOUTH EVERY DAY   Dialyvite 800-Zinc 15 0.8 MG Tabs Take 1 tablet by mouth daily.   Eliquis 2.5 MG Tabs tablet Generic drug: apixaban TAKE 1 TABLET TWICE DAILY What changed: how much to take   feeding supplement (NEPRO CARB STEADY) Liqd Take 237 mLs by mouth 2 (two) times daily between meals.   fluorometholone 0.1 % ophthalmic suspension Commonly known as: FML Place 1 drop  into the right eye daily as needed (eyes).   HYDROmorphone 4 MG tablet Commonly known as: DILAUDID Take 1 tablet (4 mg total) by mouth every 2 (two) hours as needed for moderate pain or severe pain (distress, increased work of breathing, dyspnea, RR >25, premedicating prior to movement).   lactulose 10 GM/15ML solution Commonly known as: CHRONULAC TAKE 15 MLS (10 G TOTAL) BY MOUTH 3 (THREE) TIMES DAILY. What changed: See the new instructions.   lidocaine 5 % Commonly known as: LIDODERM Place 1 patch onto the skin daily.   lidocaine-prilocaine cream Commonly known as: EMLA Apply 1 application topically as needed [facility]).   Lyrica 50 MG capsule Generic drug: pregabalin TAKE 1 CAPSULE (50 MG TOTAL) BY MOUTH AT BEDTIME.   metoCLOPramide 5 MG  tablet Commonly known as: REGLAN TAKE 1 TABLET BY MOUTH 3 TIMES DAILY BEFORE MEALS What changed: See the new instructions.   midodrine 10 MG tablet Commonly known as: PROAMATINE Take 10 mg by mouth as directed. Low bp, and before dialysis   multivitamin tablet Take 1 tablet by mouth daily.   ondansetron 4 MG tablet Commonly known as: ZOFRAN Take 1 tablet (4 mg total) by mouth every 8 (eight) hours as needed. for nausea   pantoprazole 40 MG tablet Commonly known as: PROTONIX TAKE 1 TABLET (40 MG TOTAL) BY MOUTH DAILY AS NEEDED (ACID REFLUX/INDIGESTION.).   polyethylene glycol 17 g packet Commonly known as: MIRALAX / GLYCOLAX Take 17 g by mouth daily.   senna-docusate 8.6-50 MG tablet Commonly known as: Senokot-S Take 2 tablets by mouth 2 (two) times daily.   sevelamer carbonate 800 MG tablet Commonly known as: RENVELA Take 800 mg by mouth in the morning, at noon, and at bedtime.               Durable Medical Equipment  (From admission, onward)           Start     Ordered   10/31/21 1044  For home use only DME Hospital bed  Once       Question Answer Comment  Length of Need Lifetime   Bed type Semi-electric      10/31/21 1043            Allergies  Allergen Reactions   Aspirin Other (See Comments)    REACTION: STOMACH ISSUES WITH DOSE HIGHER THAN 81 MG  Other reaction(s): GI Upset (intolerance), Other (See Comments) REACTION: STOMACH ISSUES WITH DOSE HIGHER THAN 81 MG  REACTION: STOMACH ISSUES WITH DOSE HIGHER THAN 81 MG    Hydrocodone-Acetaminophen Nausea And Vomiting   Morphine Nausea And Vomiting   Penicillins Rash    Patient took injection and tablets, had a reaction. She has taken amoxicillin with no reaction Has patient had a PCN reaction causing immediate rash, facial/tongue/throat swelling, SOB or lightheadedness with hypotension: Yes Has patient had a PCN reaction causing severe rash involving mucus membranes or skin necrosis: Yes Has  patient had a PCN reaction that required hospitalization No Has patient had a PCN reaction occurring within the last 10 years: No If all of the above answers are "NO", then m Patient took injection and tablets, had a reaction. She has taken amoxicillin with no reaction Patient took injection and tablets, had a reaction. She has taken amoxicillin with no reaction Has patient had a PCN reaction causing immediate rash, facial/tongue/throat swelling, SOB or lightheadedness with hypotension: Yes Has patient had a PCN reaction causing severe rash involving mucus membranes or skin necrosis:  Yes Has patient had a PCN reaction that required hospitalization No Has patient had a PCN reaction occurring within the last 10 years: No If all of the above answers are "NO", then m   Betaine Nausea And Vomiting   Dacarbazine Nausea And Vomiting   Ketorolac Tromethamine Itching   Brimonidine Itching   Diltiazem Hcl Rash   Hydrocodone-Acetaminophen Nausea And Vomiting   Neurontin [Gabapentin] Nausea And Vomiting   Sulfacetamide Sodium Rash    Consultations: Palliative care Nephrology, oncology  Procedures/Studies: CT Head Wo Contrast  Result Date: 10/27/2021 CLINICAL DATA:  Mental status changes EXAM: CT HEAD WITHOUT CONTRAST TECHNIQUE: Contiguous axial images were obtained from the base of the skull through the vertex without intravenous contrast. RADIATION DOSE REDUCTION: This exam was performed according to the departmental dose-optimization program which includes automated exposure control, adjustment of the mA and/or kV according to patient size and/or use of iterative reconstruction technique. COMPARISON:  03/21/2020 FINDINGS: Brain: There is atrophy and chronic small vessel disease changes. No acute intracranial abnormality. Specifically, no hemorrhage, hydrocephalus, mass lesion, acute infarction, or significant intracranial injury. Vascular: No hyperdense vessel or unexpected calcification. Skull: No  acute calvarial abnormality. Sinuses/Orbits: No acute findings Other: None IMPRESSION: Atrophy, chronic microvascular disease. No acute intracranial abnormality. Electronically Signed   By: Rolm Baptise M.D.   On: 10/27/2021 03:38   CT Thoracic Spine Wo Contrast  Result Date: 10/11/2021 CLINICAL DATA:  Multiple falls. Increased back pain. History of multiple myeloma. EXAM: CT THORACIC AND LUMBAR SPINE WITHOUT CONTRAST TECHNIQUE: Multidetector CT imaging of the thoracic and lumbar spine was performed without contrast. Multiplanar CT image reconstructions were also generated. COMPARISON:  CTA chest 09/10/2020. CT abdomen and pelvis 11/27/2019. FINDINGS: CT THORACIC SPINE FINDINGS Alignment: Unchanged grade 1 anterolisthesis of C7 on T1. Vertebrae: Numerous lytic lesions throughout the thoracic spine. Large destructive lesion involving the left aspect of the T1 vertebral body, left-sided T1 posterior elements, and adjacent left first rib with prominent extraosseous soft tissue component involving the left C7-T1 and T1-2 neural foramina, greatly progressed from the 09/10/2020 chest CTA. 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 New lesion involving the left lamina of T10 with cortical disruption and extraosseous tumor extension posteriorly. No acute fracture. Paraspinal and other soft tissues: Aortic and coronary atherosclerosis. Calcified right hilar lymph nodes. Disc levels: Diffuse thoracic disc degeneration. Mild-to-moderate spinal stenosis at T11-12 due to circumferential disc bulging, endplate spurring, and severe right and moderate left facet arthrosis. CT LUMBAR SPINE FINDINGS Segmentation: 5 lumbar type vertebrae. Alignment: Moderate lumbar dextroscoliosis. Chronic grade 1 anterolisthesis of L4 on L5. Vertebrae: Numerous lytic lesions throughout the lumbar spine and included sacrum including a dominant 2.5 cm lesion in the L1 vertebral body, similar to the  11/27/2019 CT of the abdomen and pelvis. No acute fracture. Prior Ray cage fusion at L4-5. Paraspinal and other soft tissues: Bilateral renal atrophy. Abdominal aortic atherosclerosis. Disc levels: Advanced disc and facet degeneration from L2-3 to L5-S1. Prior laminectomies at L2-3, L3-4, and L4-5. Moderate spinal stenosis and severe left neural foraminal stenosis at L3-4 due to left eccentric disc bulging and posterior element hypertrophy. IMPRESSION: 1. Widespread lytic bone lesions consistent with multiple myeloma. 2. Large destructive lesion involving the left aspect of vertebral body and posterior elements of T1 and adjacent left first rib, greatly progressed from 09/10/2020. 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 3. No acute thoracic or lumbar spine fracture. 4. Advanced lumbar  disc and facet degeneration with moderate spinal stenosis and severe left neural foraminal stenosis at L3-4. 5. Aortic Atherosclerosis (ICD10-I70.0). Electronically Signed   By: Logan Bores M.D.   On: 10/11/2021 17:20   CT Lumbar Spine Wo Contrast  Result Date: 10/11/2021 CLINICAL DATA:  Multiple falls. Increased back pain. History of multiple myeloma. EXAM: CT THORACIC AND LUMBAR SPINE WITHOUT CONTRAST TECHNIQUE: Multidetector CT imaging of the thoracic and lumbar spine was performed without contrast. Multiplanar CT image reconstructions were also generated. COMPARISON:  CTA chest 09/10/2020. CT abdomen and pelvis 11/27/2019. FINDINGS: CT THORACIC SPINE FINDINGS Alignment: Unchanged grade 1 anterolisthesis of C7 on T1. Vertebrae: Numerous lytic lesions throughout the thoracic spine. Large destructive lesion involving the left aspect of the T1 vertebral body, left-sided T1 posterior elements, and adjacent left first rib with prominent extraosseous soft tissue component involving the left C7-T1 and T1-2 neural foramina, greatly progressed from the 09/10/2020 chest CTA. Suspected left-sided  ventral epidural tumor at T1, not well evaluated by noncontrast CT. New lesion involving the left lamina of T10 with cortical disruption and extraosseous tumor extension posteriorly. No acute fracture. Paraspinal and other soft tissues: Aortic and coronary atherosclerosis. Calcified right hilar lymph nodes. Disc levels: Diffuse thoracic disc degeneration. Mild-to-moderate spinal stenosis at T11-12 due to circumferential disc bulging, endplate spurring, and severe right and moderate left facet arthrosis. CT LUMBAR SPINE FINDINGS Segmentation: 5 lumbar type vertebrae. Alignment: Moderate lumbar dextroscoliosis. Chronic grade 1 anterolisthesis of L4 on L5. Vertebrae: Numerous lytic lesions throughout the lumbar spine and included sacrum including a dominant 2.5 cm lesion in the L1 vertebral body, similar to the 11/27/2019 CT of the abdomen and pelvis. No acute fracture. Prior Ray cage fusion at L4-5. Paraspinal and other soft tissues: Bilateral renal atrophy. Abdominal aortic atherosclerosis. Disc levels: Advanced disc and facet degeneration from L2-3 to L5-S1. Prior laminectomies at L2-3, L3-4, and L4-5. Moderate spinal stenosis and severe left neural foraminal stenosis at L3-4 due to left eccentric disc bulging and posterior element hypertrophy. IMPRESSION: 1. Widespread lytic bone lesions consistent with multiple myeloma. 2. Large destructive lesion involving the left aspect of vertebral body and posterior elements of T1 and adjacent left first rib, greatly progressed from 09/10/2020. 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 3. No acute thoracic or lumbar spine fracture. 4. Advanced lumbar disc and facet degeneration with moderate spinal stenosis and severe left neural foraminal stenosis at L3-4. 5. Aortic Atherosclerosis (ICD10-I70.0). Electronically Signed   By: Logan Bores M.D.   On: 10/11/2021 17:20   DG Chest Port 1 View  Result Date: 10/11/2021 CLINICAL DATA:  Chest  pain EXAM: PORTABLE CHEST 1 VIEW COMPARISON:  01/12/2021 FINDINGS: Transverse diameter of heart is increased. There are no signs of pulmonary edema or focal pulmonary consolidation. There is no pleural effusion or pneumothorax. Pacemaker battery is seen in the left infraclavicular region with tips of leads in right atrium and right ventricle. IMPRESSION: Cardiomegaly. There are no signs of pulmonary edema or focal pulmonary consolidation. Electronically Signed   By: Elmer Picker M.D.   On: 10/11/2021 13:09   CT CHEST ABDOMEN PELVIS WO CONTRAST  Result Date: 10/27/2021 CLINICAL DATA:  Trauma. EXAM: CT CHEST, ABDOMEN AND PELVIS WITHOUT CONTRAST TECHNIQUE: Multidetector CT imaging of the chest, abdomen and pelvis was performed following the standard protocol without IV contrast. RADIATION DOSE REDUCTION: This exam was performed according to the departmental dose-optimization program which includes automated exposure control, adjustment of the mA and/or kV  according to patient size and/or use of iterative reconstruction technique. COMPARISON:  Thoracic and lumbar spine CT dated 10/11/2021 and chest CT dated 09/10/2020. FINDINGS: Evaluation of this exam is limited in the absence of intravenous contrast. CT CHEST FINDINGS Cardiovascular: Moderate cardiomegaly. No pericardial effusion. Advanced 3 vessel coronary vascular calcification. There is severe atherosclerotic calcification of the thoracic aorta. No aneurysmal dilatation. Mild dilatation of the main pulmonary trunk suggestive of pulmonary hypertension. Mediastinum/Nodes: Right hilar calcified granuloma. No adenopathy. The esophagus is grossly unremarkable. No mediastinal fluid collection. Lungs/Pleura: Bibasilar linear atelectasis/scarring. No lobar consolidation, pleural effusion, pneumothorax. The central airways are patent. Musculoskeletal: Left pectoral pacemaker device. Osteopenia with degenerative changes of the spine. There is a 3.7 x 3.9 cm  paraspinal mass with infiltration of the left first and second ribs at the costovertebral junction as well as destructive changes of the T1 and probably C7 most consistent with malignancy or metastatic disease. There is severe degenerative changes of the shoulders. Fluid density adjacent to the left shoulder, likely effusion. Additional smaller scattered bone lucencies suspicious for metastatic disease. No definite acute osseous pathology. CT ABDOMEN PELVIS FINDINGS No intra-abdominal free air or free fluid. Hepatobiliary: The liver is unremarkable. No intrahepatic biliary dilatation. There is a stone within the gallbladder. No pericholecystic fluid or evidence of acute cholecystitis by CT. Pancreas: Unremarkable. No pancreatic ductal dilatation or surrounding inflammatory changes. Spleen: Normal in size without focal abnormality. Adrenals/Urinary Tract: The adrenal glands unremarkable. Moderate bilateral renal parenchyma atrophy. A 1 cm hypodense lesion from the lateral interpolar left kidney is not characterized on this CT but likely represents a cyst. There is no hydronephrosis or nephrolithiasis on either side. The visualized ureters and urinary bladder appear unremarkable. Stomach/Bowel: Large amount of stool throughout the colon. There is no bowel obstruction or active inflammation. Vascular/Lymphatic: Advanced aortoiliac atherosclerotic disease. The IVC is unremarkable. No portal venous gas. There is no adenopathy. Reproductive: Hysterectomy. Other: None Musculoskeletal: Severe osteopenia and degenerative changes. Diffuse osseous lucencies suspicious for metastatic disease. L4-L5 disc spacer. No obvious acute fracture. IMPRESSION: 1. No acute traumatic injury in the chest, abdomen, or pelvis. 2. Left paraspinal mass with infiltration of the left first and second ribs at the costovertebral junction as well as destructive changes of the T1 and probably C7 ribs most consistent with malignancy or metastatic  disease. Diffuse osseous lucencies suspicious for metastatic disease. 3. Cholelithiasis. 4. Large amount of stool throughout the colon. No bowel obstruction. 5. Aortic Atherosclerosis (ICD10-I70.0). Electronically Signed   By: Anner Crete M.D.   On: 10/27/2021 01:02      Subjective: Patient seen and examined at the bedside this morning.  Hemodynamically stable.  Daughter at the bedside.  Medically stable for discharge today.  Discharge Exam: Vitals:   10/30/21 0906 10/31/21 0951  BP: (!) 120/36 (!) 105/31  Pulse: 68 73  Resp: 18 16  Temp: 97.9 F (36.6 C) 97.6 F (36.4 C)  SpO2: 100% 100%   Vitals:   10/28/21 2054 10/29/21 2105 10/30/21 0906 10/31/21 0951  BP: (!) 135/58 (!) 119/58 (!) 120/36 (!) 105/31  Pulse: 70 74 68 73  Resp: _0 Temp: 98 F (36.7 C) 97.7 F (36.5 C) 97.9 F (36.6 C) 97.6 F (36.4 C)  TempSrc: Oral Oral Oral Oral  SpO2: 95% 100% 100% 100%  Weight:      Height:        General: Pt is alert, awake, not in acute distress, deconditioned, chronically looking Cardiovascular: RRR, S1/S2 +,  no rubs, no gallops Respiratory: CTA bilaterally, no wheezing, no rhonchi Abdominal: Soft, NT, ND, bowel sounds + Extremities: no edema, no cyanosis    The results of significant diagnostics from this hospitalization (including imaging, microbiology, ancillary and laboratory) are listed below for reference.     Microbiology: Recent Results (from the past 240 hour(s))  Resp Panel by RT-PCR (Flu A&B, Covid) Nasopharyngeal Swab     Status: None   Collection Time: 10/27/21 12:33 AM   Specimen: Nasopharyngeal Swab; Nasopharyngeal(NP) swabs in vial transport medium  Result Value Ref Range Status   SARS Coronavirus 2 by RT PCR NEGATIVE NEGATIVE Final    Comment: (NOTE) SARS-CoV-2 target nucleic acids are NOT DETECTED.  The SARS-CoV-2 RNA is generally detectable in upper respiratory specimens during the acute phase of infection. The lowest concentration of  SARS-CoV-2 viral copies this assay can detect is 138 copies/mL. A negative result does not preclude SARS-Cov-2 infection and should not be used as the sole basis for treatment or other patient management decisions. A negative result may occur with  improper specimen collection/handling, submission of specimen other than nasopharyngeal swab, presence of viral mutation(s) within the areas targeted by this assay, and inadequate number of viral copies(<138 copies/mL). A negative result must be combined with clinical observations, patient history, and epidemiological information. The expected result is Negative.  Fact Sheet for Patients:  EntrepreneurPulse.com.au  Fact Sheet for Healthcare Providers:  IncredibleEmployment.be  This test is no t yet approved or cleared by the [place] FDA and  has been authorized for detection and/or diagnosis of SARS-CoV-2 by FDA under an Emergency Use Authorization (EUA). This EUA will remain  in effect (meaning this test can be used) for the duration of the COVID-19 declaration under Section 564(b)(1) of the Act, 21 U.S.C.section 360bbb-3(b)(1), unless the authorization is terminated  or revoked sooner.       Influenza A by PCR NEGATIVE NEGATIVE Final   Influenza B by PCR NEGATIVE NEGATIVE Final    Comment: (NOTE) The Xpert Xpress SARS-CoV-2/FLU/RSV plus assay is intended as an aid in the diagnosis of influenza from Nasopharyngeal swab specimens and should not be used as a sole basis for treatment. Nasal washings and aspirates are unacceptable for Xpert Xpress SARS-CoV-2/FLU/RSV testing.  Fact Sheet for Patients: EntrepreneurPulse.com.au  Fact Sheet for Healthcare Providers: IncredibleEmployment.be  This test is not yet approved or cleared by the [place] FDA and has been authorized for detection and/or diagnosis of SARS-CoV-2 by FDA under an Emergency Use  Authorization (EUA). This EUA will remain in effect (meaning this test can be used) for the duration of the COVID-19 declaration under Section 564(b)(1) of the Act, 21 U.S.C. section 360bbb-3(b)(1), unless the authorization is terminated or revoked.  Performed at [facility], [place] [address], [place], [place] [address]   MRSA Next Gen by PCR, Nasal     Status: None   Collection Time: 10/27/21  2:58 PM   Specimen: Nasal Mucosa; Nasal Swab  Result Value Ref Range Status   MRSA by PCR Next Gen NOT DETECTED NOT DETECTED Final    Comment: (NOTE) The GeneXpert MRSA Assay (FDA approved for NASAL specimens only), is one component of a comprehensive MRSA colonization surveillance program. It is not intended to diagnose MRSA infection nor to guide or monitor treatment for MRSA infections. Test performance is not FDA approved in patients less than 52 years old. Performed at [facility], [place] [address] [place], [place] [address]  Labs: BNP (last 3 results) No results for input(s): BNP in the last 8760 hours. Basic Metabolic Panel: Recent Labs  Lab 10/24/21 1234 10/27/21 0125 10/27/21 0616 10/27/21 1532 10/28/21 0219  NA 133* 132* 132*  --  135  K 4.4 5.3* 5.8*  --  4.6  CL 94* 93* 92*  --  97*  CO2 _0 --  24  GLUCOSE 116* 91 88  --  72  BUN 65* 74* 80*  --  54*  CREATININE 5.90* 6.50* 6.61*  --  5.06*  CALCIUM 9.4 9.6 9.7  --  9.2  MG  --  2.8*  --   --   --   PHOS  --   --   --  6.9*  --    Liver Function Tests: Recent Labs  Lab 10/24/21 1234 10/27/21 0125  AST 19 29  ALT 14 19  ALKPHOS 43 39  BILITOT 0.3 0.6  PROT 7.7 7.6  ALBUMIN 3.6 3.0*   No results for input(s): LIPASE, AMYLASE in the last 168 hours. Recent Labs  Lab 10/27/21 0612  AMMONIA 19   CBC: Recent Labs  Lab 10/24/21 1234 10/27/21 0125 10/27/21 0616 10/28/21 0219  WBC 8.8 15.8* 14.1* 12.0*  NEUTROABS 6.8 13.4*  --   --   HGB 9.4* 9.7* 8.9* 8.4*  HCT 28.9* 30.1*  27.6* 26.3*  MCV 99.0 101.0* 101.1* 100.0  PLT 225 199 199 193   Cardiac Enzymes: No results for input(s): CKTOTAL, CKMB, CKMBINDEX, TROPONINI in the last 168 hours. BNP: Invalid input(s): POCBNP CBG: Recent Labs  Lab 10/29/21 1631 10/29/21 2104 10/30/21 0633 10/30/21 1143 10/30/21 1618  GLUCAP 148* 154* 88 88 102*   D-Dimer No results for input(s): DDIMER in the last 72 hours. Hgb A1c No results for input(s): HGBA1C in the last 72 hours. Lipid Profile No results for input(s): CHOL, HDL, LDLCALC, TRIG, CHOLHDL, LDLDIRECT in the last 72 hours. Thyroid function studies No results for input(s): TSH, T4TOTAL, T3FREE, THYROIDAB in the last 72 hours.  Invalid input(s): FREET3 Anemia work up No results for input(s): VITAMINB12, FOLATE, FERRITIN, TIBC, IRON, RETICCTPCT in the last 72 hours. Urinalysis    Component Value Date/Time   COLORURINE YELLOW 11/27/2019 1430   APPEARANCEUR CLEAR 11/27/2019 1430   LABSPEC 1.010 11/27/2019 1430   PHURINE 9.0 (H) 11/27/2019 1430   GLUCOSEU NEGATIVE 11/27/2019 1430   HGBUR NEGATIVE 11/27/2019 1430   BILIRUBINUR NEGATIVE 11/27/2019 1430   KETONESUR NEGATIVE 11/27/2019 1430   PROTEINUR NEGATIVE 11/27/2019 1430   UROBILINOGEN 1.0 10/05/2012 1953   NITRITE NEGATIVE 11/27/2019 1430   LEUKOCYTESUR SMALL (A) 11/27/2019 1430   Sepsis Labs Invalid input(s): PROCALCITONIN,  WBC,  LACTICIDVEN Microbiology Recent Results (from the past 240 hour(s))  Resp Panel by RT-PCR (Flu A&B, Covid) Nasopharyngeal Swab     Status: None   Collection Time: 10/27/21 12:33 AM   Specimen: Nasopharyngeal Swab; Nasopharyngeal(NP) swabs in vial transport medium  Result Value Ref Range Status   SARS Coronavirus 2 by RT PCR NEGATIVE NEGATIVE Final    Comment: (NOTE) SARS-CoV-2 target nucleic acids are NOT DETECTED.  The SARS-CoV-2 RNA is generally detectable in upper respiratory specimens during the acute phase of infection. The lowest concentration of SARS-CoV-2  viral copies this assay can detect is 138 copies/mL. A negative result does not preclude SARS-Cov-2 infection and should not be used as the sole basis for treatment or other patient management decisions. A negative result may occur with  improper specimen  collection/handling, submission of specimen other than nasopharyngeal swab, presence of viral mutation(s) within the areas targeted by this assay, and inadequate number of viral copies(<138 copies/mL). A negative result must be combined with clinical observations, patient history, and epidemiological information. The expected result is Negative.  Fact Sheet for Patients:  EntrepreneurPulse.com.au  Fact Sheet for Healthcare Providers:  IncredibleEmployment.be  This test is no t yet approved or cleared by the [place] FDA and  has been authorized for detection and/or diagnosis of SARS-CoV-2 by FDA under an Emergency Use Authorization (EUA). This EUA will remain  in effect (meaning this test can be used) for the duration of the COVID-19 declaration under Section 564(b)(1) of the Act, 21 U.S.C.section 360bbb-3(b)(1), unless the authorization is terminated  or revoked sooner.       Influenza A by PCR NEGATIVE NEGATIVE Final   Influenza B by PCR NEGATIVE NEGATIVE Final    Comment: (NOTE) The Xpert Xpress SARS-CoV-2/FLU/RSV plus assay is intended as an aid in the diagnosis of influenza from Nasopharyngeal swab specimens and should not be used as a sole basis for treatment. Nasal washings and aspirates are unacceptable for Xpert Xpress SARS-CoV-2/FLU/RSV testing.  Fact Sheet for Patients: EntrepreneurPulse.com.au  Fact Sheet for Healthcare Providers: IncredibleEmployment.be  This test is not yet approved or cleared by the [place] FDA and has been authorized for detection and/or diagnosis of SARS-CoV-2 by FDA under an Emergency Use Authorization  (EUA). This EUA will remain in effect (meaning this test can be used) for the duration of the COVID-19 declaration under Section 564(b)(1) of the Act, 21 U.S.C. section 360bbb-3(b)(1), unless the authorization is terminated or revoked.  Performed at [facility], [place] [address], [place], [place] [address]   MRSA Next Gen by PCR, Nasal     Status: None   Collection Time: 10/27/21  2:58 PM   Specimen: Nasal Mucosa; Nasal Swab  Result Value Ref Range Status   MRSA by PCR Next Gen NOT DETECTED NOT DETECTED Final    Comment: (NOTE) The GeneXpert MRSA Assay (FDA approved for NASAL specimens only), is one component of a comprehensive MRSA colonization surveillance program. It is not intended to diagnose MRSA infection nor to guide or monitor treatment for MRSA infections. Test performance is not FDA approved in patients less than 79 years old. Performed at [facility], [place] [address], [place], [place] [address]     Please note: You were cared for by a hospitalist during your hospital stay. Once you are discharged, your primary care physician will handle any further medical issues. Please note that NO REFILLS for any discharge medications will be authorized once you are discharged, as it is imperative that you return to your primary care physician (or establish a relationship with a primary care physician if you do not have one) for your post hospital discharge needs so that they can reassess your need for medications and monitor your lab values.    Time coordinating discharge: 40 minutes  SIGNED:   Shelly Coss, MD  [facility] 10/31/2021, 10:57 AM Pager [phone]  If 7PM-7AM, please contact night-coverage [email or web address] Password [email or web address]

## 2021-10-31 NOTE — Progress Notes (Signed)
Patient refused CPAP use at this time.  

## 2021-10-31 NOTE — Progress Notes (Signed)
Alexandra  Petit to be discharged home per MD order. Discussed prescriptions and follow up appointments with the patient and family. Prescriptions given to family; medication list explained in detail. Patient and family verbalized understanding.  Skin clean, dry, no evidence of skin tears noted. No IV. No complaints noted.  Patient free of lines, drains, and wounds.  An After Visit Summary (AVS) was printed and given to the family at her bedside Patient escorted via stretcher with [facility],   Rockie Neighbours RN

## 2021-10-31 NOTE — Progress Notes (Addendum)
Daily Progress Note   Patient Name: [name]       Date: 10/31/2021 DOB: [birth year]  Age: 88 y.o. MRN#: [id number] Attending Physician: Shelly Coss, MD Primary Care Physician: Lucianne Lei, MD Admit Date: 10/26/2021  Reason for Consultation/Follow-up: Non pain symptom management, Pain control, Psychosocial/spiritual support, and Terminal Care, Establishing goals of care, "Very elderly patient with history of multiple myeloma presented with confusion.  She is also on dialysis.  I think she is a candidate for hospice care.  We need goals of care.  She is still full code."  Subjective: Received notification that patient's son/Jacques called PMT number with questions. Returned call. Answered questions about turning off PPM. Education provided on the difference between PPM and ICD at end of life. Son is ok with not having PPM deactivated.   Detailed chart review performed to ensure patient does not have ICD - no evidence found - only PPM.   Chart review performed - noted patient received 78m of dilaudid IR yesterday. Received report from primary RN - no acute concerns.  Went to visit patient at bedside - [name], grandson, and granddaughter-in-law were present. Patient was lying in bed awake, alert, oriented to self only, and able to participate in simple conversation. No signs or non-verbal gestures of pain or discomfort noted. No respiratory distress, increased work of breathing, or secretions noted. Patient tells me her "back hurts."  Emotional support provided to family. Education provided on the difference between PPM and ICD at end of life per their request. They also are ok with leaving PPM on. Reviewed current pain management plan and their thoughts to increase long acting  dilaudid - family are agreeable. Reviewed discharge process per their request.  All questions and concerns addressed. Encouraged to call with questions and/or concerns. PMT card provided.  1:29 PM Received notification that DME will be delivered today and family will be ready for patient's transport home at [address] ATawanna Solo primary RN, and [facility] aware.    Length of Stay: 2  Current Medications: Scheduled Meds:   antiseptic oral rinse  15 mL Topical BID   dexamethasone  4 mg Oral Q breakfast   HYDROmorphone HCl  16 mg Oral Q24H   insulin aspart  0-6 Units Subcutaneous TID WC   lidocaine  1 patch Transdermal Daily  polyethylene glycol  17 g Oral Daily    Continuous Infusions:   PRN Meds: acetaminophen **OR** acetaminophen, diphenhydrAMINE, feeding supplement (NEPRO CARB STEADY), fluorometholone, glycopyrrolate **OR** glycopyrrolate **OR** glycopyrrolate, haloperidol **OR** haloperidol **OR** haloperidol lactate, HYDROmorphone, LORazepam **OR** LORazepam **OR** LORazepam, ondansetron **OR** ondansetron (ZOFRAN) IV, polyvinyl alcohol  Physical Exam Vitals and nursing note reviewed.  Constitutional:      General: She is not in acute distress.    Appearance: She is ill-appearing.  Pulmonary:     Effort: No respiratory distress.  Skin:    General: Skin is warm and dry.  Neurological:     Mental Status: She is lethargic, disoriented and confused.     Motor: Weakness present.  Psychiatric:        Attention and Perception: Attention normal.        Behavior: Behavior is slowed. Behavior is cooperative.        Cognition and Memory: Cognition is impaired. Memory is impaired.            Vital Signs: BP (!) 105/31    Pulse 73    Temp 97.6 F (36.4 C) (Oral)    Resp 16    Ht 5' 5"  (1.651 m)    Wt 56.2 kg    SpO2 100%    BMI 20.62 kg/m  SpO2: SpO2: 100 % O2 Device: O2 Device: Room Air O2 Flow Rate:    Intake/output summary:  Intake/Output Summary (Last 24 hours) at 10/31/2021  1320 Last data filed at 10/31/2021 8003 Gross per 24 hour  Intake 120 ml  Output 0 ml  Net 120 ml   LBM: Last BM Date: 10/29/21 Baseline Weight: Weight: 62 kg Most recent weight: Weight: 56.2 kg      5 Palliative Assessment/Data: PPS 20    Flowsheet Rows    Flowsheet Row Most Recent Value  Intake Tab   Referral Department Hospitalist  Unit at Time of Referral ER  Palliative Care Primary Diagnosis Cancer  Date Notified 10/27/21  Palliative Care Type New Palliative care  Reason for referral Clarify Goals of Care  Date of Admission 10/26/21  Date first seen by Palliative Care 10/27/21  # of days Palliative referral response time 0 Day(s)  # of days IP prior to Palliative referral 1  Clinical Assessment   Psychosocial & Spiritual Assessment   Palliative Care Outcomes   Patient/Family meeting held? Yes  Who was at the meeting? son - brief phone call  - will meet in person tomorrow  Palliative Care Outcomes Clarified goals of care       Patient Active Problem List   Diagnosis Date Noted   Acute encephalopathy 10/27/2021   Sacral wound    Ambulatory dysfunction 10/11/2021   Metastatic multiple myeloma to bone ([place]) 10/11/2021   Protein-calorie malnutrition, severe 12/31/2020   Atrial flutter ([place]) 12/31/2020   Fluid overload 12/30/2020   Weight loss, non-intentional 12/10/2020   Pain due to onychomycosis of toenails of both feet 12/08/2020   Type 2 diabetes mellitus with vascular disease ([place]) 12/08/2020   Persistent atrial fibrillation ([place]) 11/08/2020   Gastritis 10/27/2020   Delayed gastric emptying 10/27/2020   Atypical atrial flutter ([place]) 10/25/2020   Secondary hypercoagulable state ([place]) 10/25/2020   Pulmonary edema 09/10/2020   Pleural effusion, bilateral 09/10/2020   Acute respiratory failure with hypoxia ([facility]) 04/02/2020   Elevated troponin 04/02/2020   Hypotension 03/23/2020   Left hip pain 01/12/2020   Vitamin B12 deficiency 10/20/2019   Deficiency  anemia 09/26/2019  Anaphylactic shock, unspecified, sequela 68/34/1962   Complication of vascular dialysis catheter 07/21/2019   Peripheral neuropathy due to chemotherapy ([place]) 07/04/2019   Encounter for removal of sutures 07/03/2019   Goals of care, counseling/discussion 05/30/2019   Pressure injury of skin 05/26/2019   Yeast infection of the skin 04/04/2019   Cancer associated pain 03/26/2019   Moderate protein-calorie malnutrition ([place]) 03/24/2019   Nausea in adult 03/14/2019   ESRD (end stage renal disease) on dialysis ([place]) 03/14/2019   Other constipation 03/14/2019   Coagulation defect, unspecified ([place]) 03/13/2019   Pancytopenia, acquired ([place]) 03/12/2019   Anemia in chronic kidney disease 03/11/2019   Bradycardia, unspecified 03/11/2019   Iron deficiency anemia, unspecified 03/11/2019   Secondary hyperparathyroidism of renal origin ([place]) 03/11/2019   Multiple myeloma not having achieved remission ([place]) 03/03/2019   Hypokalemia 02/25/2019   Anemia    Shingles 03/09/2015   Sick sinus syndrome ([place]) 02/03/2015   Shortness of breath 02/01/2014   Fatigue 02/01/2014   BENIGN PAROXYSMAL POSITIONAL VERTIGO 04/06/2010   BRADYCARDIA 01/27/2009   Diabetes mellitus ([place]) 02/24/2008   Obstructive sleep apnea 02/24/2008   PERIODIC LIMB MOVEMENT DISORDER 02/24/2008   ALLERGIC RHINITIS 02/24/2008   CAROTID BRUIT 02/24/2008    Palliative Care Assessment & Plan   Patient Profile: 88 y.o. female  with past medical history of ESRD on hemodialysis, type 2 diabetes mellitus, OSA, sick sinus syndrome with pacer, multiple myeloma, and chronic cancer-related pain presented to ED on 10/26/21 from home with son's concerns that patient was experiencing several days of progressive confusion, increased back pain, and new sacral wound. Patient was  admitted on 10/26/2021 with acute encephalopathy, multiple myeloma not achieving remission, ESRD on HD, chronic pain, pressure injury. Patient is followed  by [facility] who has seen patient in house and does not feel patient can tolerate any further chemotherapy due to her frail status - she recommends hospice care. Nephrology was also consulted and feels if patient is no longer able to tolerate chemotherapy, it is unlikely they will be able to continue dialysis.  Assessment: Acute encephalopathy Mestastatic multiple myeloma ESRD on HD Chronic pain syndrome DM2 Terminal care  Recommendations/Plan: Continue full comfort measures Continue DNR/DNI as previously documented Per family, DME will be delivered today - family request patient's transport home be scheduled for 7pm tonight - TOC made aware Increase long-acting dilaudid (Exalgo) to 44m daily - discharge prescription sent. Continue PRN dose for breakthrough pain Continue dexamethasone 468mdaily and lidocaine patch Ok for PPM to remain activated Continue palliative wound care PMT will continue to follow and support holistically  Goals of Care and Additional Recommendations: Limitations on Scope of Treatment: Full Comfort Care  Code Status:    Code Status Orders  (From admission, onward)           Start     Ordered   10/28/21 1716  Do not attempt resuscitation (DNR)  Continuous       Question Answer Comment  In the event of cardiac or respiratory ARREST Do not call a code blue   In the event of cardiac or respiratory ARREST Do not perform Intubation, CPR, defibrillation or ACLS   In the event of cardiac or respiratory ARREST Use medication by any route, position, wound care, and other measures to relive pain and suffering. May use oxygen, suction and manual treatment of airway obstruction as needed for comfort.      10/28/21 1732  Code Status History     Date Active Date Inactive Code Status Order ID Comments User Context   10/27/2021 0325 10/28/2021 1732 Full Code [id number]  Vianne Bulls, MD ED   10/11/2021 1533 10/14/2021 1957 DNR [id number]   Karmen Bongo, MD ED   12/30/2020 2118 01/07/2021 2143 Full Code [id number]  Etta Quill, DO ED   09/11/2020 0724 09/12/2020 1909 Full Code [id number]  Edwin Dada, MD Inpatient   04/02/2020 0924 04/04/2020 2122 Full Code [id number]  Vianne Bulls, MD ED   05/25/2019 2131 05/26/2019 2030 Full Code [id number]  Bonnell Public, MD ED   02/25/2019 1358 03/11/2019 2007 Full Code [id number]  Georgette Shell, MD ED   02/25/2019 1300 02/25/2019 1358 Full Code [id number]  Georgette Shell, MD ED   05/16/2016 1510 05/16/2016 2008 Full Code [id number]  Thompson Grayer, MD Inpatient   03/09/2015 2011 03/10/2015 2036 Full Code [id number]  Mendel Corning, MD Inpatient      Advance Directive Documentation    Flowsheet Row Most Recent Value  Type of Advance Directive Out of facility DNR (pink MOST or yellow form)  Pre-existing out of facility DNR order (yellow form or pink MOST form) Pink MOST form placed in chart (order not valid for inpatient use)  "MOST" Form in Place? --       Prognosis:  < 2 weeks  Discharge Planning: Home with Hospice  Care plan was discussed with primary RN, Dr. Tawanna Solo, [facility], patient's family  Thank you for allowing the Palliative Medicine Team to assist in the care of this patient.   Total Time 40 minutes Prolonged Time Billed  no       Greater than 50%  of this time was spent counseling and coordinating care related to the above assessment and plan.  Lin Landsman, NP  Please contact Palliative Medicine Team phone at [phone] for questions and concerns.

## 2021-10-31 NOTE — TOC Transition Note (Signed)
Transition of Care [facility]) - CM/SW Discharge Note   Patient Details  Name: [name] MRN: [id number] Date of Birth: 22-Jun-1930  [facility] CM/SW Contact:  Tom-Johnson, Renea Ee, RN Phone Number: 10/31/2021, 1:45 PM   Clinical Narrative:     Patient is scheduled for discharge today with home hospice care and [facility] to follow at home.Home Hospital bed to be delivered today. CM notified by Elmer Picker that family requests to schedule transportation for 7 pm. CM called [facility] ans scheduled pickup for 7 pm. No further TOC needs noted.   Final next level of care: Home w Hospice Care Barriers to Discharge: Barriers Resolved   Patient Goals and CMS Choice Patient states their goals for this hospitalization and ongoing recovery are:: home with hospice and family support CMS Medicare.gov Compare Post Acute Care list provided to:: Patient Represenative (must comment) (son, [name]) Choice offered to / list presented to : Adult Children (son, [name])  Discharge Placement                Patient to be transferred to facility by: [facility] (Home)      Discharge Plan and Services In-house Referral: Hospice / Palliative Care Discharge Planning Services: CM Consult Post Acute Care Choice: Hospice          DME Arranged: Hospital bed DME Agency: Other - Comment ([facility])       HH Arranged: NA HH Agency: NA        Social Determinants of Health (SDOH) Interventions     Readmission Risk Interventions Readmission Risk Prevention Plan 04/04/2020 03/03/2019  Transportation Screening Complete Complete  PCP or Specialist Appt within 3-5 Days - Not Complete  Not Complete comments - not yet ready for d/c  HRI or Rafael Gonzalez - Not Complete  HRI or Home Care Consult comments - no needs at this time  Social Work Consult for [place] Planning/Counseling - Not Complete  SW consult not completed comments - no needs at this time   Palliative Care Screening - Not Applicable  Medication Review (RN Care Manager) Complete Complete  PCP or Specialist appointment within 3-5 days of discharge (No Data) -  [place] or Home Care Consult Complete -  SW Recovery Care/Counseling Consult Complete -  Palliative Care Screening Not Applicable -  [place] Not Applicable -  Some recent data might be hidden

## 2021-11-04 ENCOUNTER — Ambulatory Visit (INDEPENDENT_AMBULATORY_CARE_PROVIDER_SITE_OTHER): Payer: Medicare PPO

## 2021-11-04 DIAGNOSIS — I495 Sick sinus syndrome: Secondary | ICD-10-CM

## 2021-11-06 LAB — CUP PACEART REMOTE DEVICE CHECK
Battery Remaining Longevity: 28 mo
Battery Remaining Percentage: 32 %
Battery Voltage: 2.93 V
Brady Statistic AP VP Percent: 73 %
Brady Statistic AP VS Percent: 20 %
Brady Statistic AS VP Percent: 3 %
Brady Statistic AS VS Percent: 3.9 %
Brady Statistic RA Percent Paced: 89 %
Brady Statistic RV Percent Paced: 76 %
Date Time Interrogation Session: 20230120031438
Implantable Lead Implant Date: 20100422
Implantable Lead Implant Date: 20100422
Implantable Lead Location: 753859
Implantable Lead Location: 753860
Implantable Pulse Generator Implant Date: 20170801
Lead Channel Impedance Value: 390 Ohm
Lead Channel Impedance Value: 390 Ohm
Lead Channel Pacing Threshold Amplitude: 0.5 V
Lead Channel Pacing Threshold Amplitude: 0.75 V
Lead Channel Pacing Threshold Pulse Width: 0.5 ms
Lead Channel Pacing Threshold Pulse Width: 0.5 ms
Lead Channel Sensing Intrinsic Amplitude: 2.3 mV
Lead Channel Sensing Intrinsic Amplitude: 8.3 mV
Lead Channel Setting Pacing Amplitude: 2 V
Lead Channel Setting Pacing Amplitude: 2.5 V
Lead Channel Setting Pacing Pulse Width: 0.5 ms
Lead Channel Setting Sensing Sensitivity: 2 mV
Pulse Gen Model: 2272
Pulse Gen Serial Number: 7929552

## 2021-11-16 DEATH — deceased

## 2021-11-17 NOTE — Addendum Note (Signed)
Addended by: Cheri Kearns A on: 11/17/2021 09:12 AM   Modules accepted: Level of Service

## 2021-11-17 NOTE — Progress Notes (Signed)
Remote pacemaker transmission.   

## 2021-11-21 ENCOUNTER — Ambulatory Visit: Payer: Medicare PPO | Admitting: Podiatry
